# Patient Record
Sex: Female | Born: 1980 | Race: White | Hispanic: No | Marital: Married | Smoking: Former smoker
Health system: Southern US, Community
[De-identification: ages and names within clinical notes are randomized; demographics above are authoritative.]

## PROBLEM LIST (undated history)

## (undated) DIAGNOSIS — Z170 Estrogen receptor positive status [ER+]: Secondary | ICD-10-CM

## (undated) DIAGNOSIS — R0602 Shortness of breath: Secondary | ICD-10-CM

## (undated) DIAGNOSIS — Z803 Family history of malignant neoplasm of breast: Secondary | ICD-10-CM

## (undated) DIAGNOSIS — C50411 Malignant neoplasm of upper-outer quadrant of right female breast: Secondary | ICD-10-CM

## (undated) DIAGNOSIS — Z8759 Personal history of other complications of pregnancy, childbirth and the puerperium: Secondary | ICD-10-CM

## (undated) DIAGNOSIS — F1190 Opioid use, unspecified, uncomplicated: Secondary | ICD-10-CM

## (undated) DIAGNOSIS — R110 Nausea: Secondary | ICD-10-CM

## (undated) DIAGNOSIS — A609 Anogenital herpesviral infection, unspecified: Secondary | ICD-10-CM

## (undated) DIAGNOSIS — F411 Generalized anxiety disorder: Secondary | ICD-10-CM

## (undated) DIAGNOSIS — F419 Anxiety disorder, unspecified: Secondary | ICD-10-CM

## (undated) DIAGNOSIS — K5909 Other constipation: Secondary | ICD-10-CM

## (undated) HISTORY — DX: Anxiety disorder, unspecified: F41.9

## (undated) HISTORY — PX: WISDOM TOOTH EXTRACTION: SHX21

## (undated) HISTORY — DX: Family history of malignant neoplasm of breast: Z80.3

## (undated) HISTORY — PX: TONSILLECTOMY: SUR1361

## (undated) HISTORY — DX: Anogenital herpesviral infection, unspecified: A60.9

---
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
## 1999-07-28 ENCOUNTER — Other Ambulatory Visit: Admission: RE | Admit: 1999-07-28 | Discharge: 1999-07-28 | Payer: Self-pay | Admitting: *Deleted

## 2000-07-28 ENCOUNTER — Other Ambulatory Visit: Admission: RE | Admit: 2000-07-28 | Discharge: 2000-07-28 | Payer: Self-pay | Admitting: Obstetrics and Gynecology

## 2001-08-05 ENCOUNTER — Other Ambulatory Visit: Admission: RE | Admit: 2001-08-05 | Discharge: 2001-08-05 | Payer: Self-pay | Admitting: Obstetrics and Gynecology

## 2002-09-07 ENCOUNTER — Other Ambulatory Visit: Admission: RE | Admit: 2002-09-07 | Discharge: 2002-09-07 | Payer: Self-pay | Admitting: Obstetrics and Gynecology

## 2011-10-14 ENCOUNTER — Encounter: Payer: Self-pay | Admitting: Family Medicine

## 2012-01-14 ENCOUNTER — Encounter: Payer: Self-pay | Admitting: Family Medicine

## 2012-01-14 ENCOUNTER — Ambulatory Visit (INDEPENDENT_AMBULATORY_CARE_PROVIDER_SITE_OTHER): Payer: BC Managed Care – PPO | Admitting: Family Medicine

## 2012-01-14 VITALS — BP 118/80 | HR 80 | Temp 98.6°F | Ht 63.5 in | Wt 163.6 lb

## 2012-01-14 DIAGNOSIS — Z6825 Body mass index (BMI) 25.0-25.9, adult: Secondary | ICD-10-CM

## 2012-01-14 DIAGNOSIS — Z0000 Encounter for general adult medical examination without abnormal findings: Secondary | ICD-10-CM

## 2012-01-14 DIAGNOSIS — E663 Overweight: Secondary | ICD-10-CM

## 2012-01-14 DIAGNOSIS — Z136 Encounter for screening for cardiovascular disorders: Secondary | ICD-10-CM

## 2012-01-14 DIAGNOSIS — R5383 Other fatigue: Secondary | ICD-10-CM

## 2012-01-14 MED ORDER — PHENTERMINE HCL 37.5 MG PO TABS: ORAL_TABLET | ORAL | Status: DC

## 2012-01-14 NOTE — Assessment & Plan Note (Signed)
Trial adipex max 3 months Pt understands con't diet and exercise rto  month

## 2012-01-14 NOTE — Patient Instructions (Signed)

## 2012-01-14 NOTE — Progress Notes (Signed)
  Subjective:    Patient ID: Kaylee Romero, female    DOB: August 15, 1980, 32 y.o.   MRN: [id number]  HPI Pt here to establish and discuss weight loss.  She was on adipex and meridia in past---years ago and she would like to try one again to help jump start her weight loss.   Pt has just bought a tread mill and will start using that and know she needs to watch her diet.   Past Medical History  Diagnosis Date  . Anxiety    History  Substance Use Topics  . Smoking status: Current Every Day Smoker -- 0.5 packs/day for 10 years    Types: Cigarettes    Start date: 01/13/2002  . Smokeless tobacco: Never Used  . Alcohol Use: Yes     Comment: Socially   Family History  Problem Relation Age of Onset  . Hypertension Father   . Breast cancer Maternal Aunt     MGA  . Alcohol abuse Paternal Grandfather   . Alcohol abuse Maternal Grandfather    Past Surgical History  Procedure Date  . Tonsillectomy 1987     Review of Systems    as above Objective:   Physical Exam BP 118/80  Pulse 80  Temp 98.6 F (37 C) (Oral)  Ht 5' 3.5" (1.613 m)  Wt 163 lb 9.6 oz (74.208 kg)  BMI 28.53 kg/m2  SpO2 98% General appearance: alert, cooperative, appears stated age and no distress Lungs: clear to auscultation bilaterally Heart: S1, S2 normal Psych-- no depression, anxiety       Assessment & Plan:

## 2012-01-15 LAB — CBC WITH DIFFERENTIAL/PLATELET
Eosinophils Relative: 3.1 % (ref 0.0–5.0)
HCT: 42.1 % (ref 36.0–46.0)
Hemoglobin: 14.3 g/dL (ref 12.0–15.0)
Lymphs Abs: 3.1 10*3/uL (ref 0.7–4.0)
MCV: 94.5 fl (ref 78.0–100.0)
Monocytes Absolute: 0.6 10*3/uL (ref 0.1–1.0)
Monocytes Relative: 5.4 % (ref 3.0–12.0)
Neutro Abs: 6.3 10*3/uL (ref 1.4–7.7)
Platelets: 252 10*3/uL (ref 150.0–400.0)
WBC: 10.4 10*3/uL (ref 4.5–10.5)

## 2012-01-15 LAB — HEPATIC FUNCTION PANEL
ALT: 19 U/L (ref 0–35)
AST: 18 U/L (ref 0–37)
Albumin: 4.5 g/dL (ref 3.5–5.2)
Total Protein: 7.5 g/dL (ref 6.0–8.3)

## 2012-01-15 LAB — BASIC METABOLIC PANEL
BUN: 17 mg/dL (ref 6–23)
Chloride: 100 mEq/L (ref 96–112)
GFR: 124.62 mL/min (ref >60.00)
Glucose, Bld: 79 mg/dL (ref 70–99)
Potassium: 3.6 mEq/L (ref 3.5–5.1)
Sodium: 136 mEq/L (ref 135–145)

## 2012-01-15 LAB — TSH: TSH: 0.75 u[IU]/mL (ref 0.35–5.50)

## 2012-01-18 LAB — POCT URINALYSIS DIPSTICK
Ketones, UA: NEGATIVE
Leukocytes, UA: NEGATIVE
Nitrite, UA: NEGATIVE
Protein, UA: NEGATIVE
Urobilinogen, UA: 0.2
pH, UA: 6.5

## 2012-02-10 ENCOUNTER — Other Ambulatory Visit: Payer: Self-pay | Admitting: Obstetrics & Gynecology

## 2012-02-11 ENCOUNTER — Ambulatory Visit: Payer: BC Managed Care – PPO | Admitting: Family Medicine

## 2012-02-16 ENCOUNTER — Telehealth: Payer: Self-pay | Admitting: Family Medicine

## 2012-02-16 NOTE — Telephone Encounter (Signed)
Its actually good to take a break from it--- we will give it to her when she comes in.  2-3 days is ok to be off.

## 2012-02-16 NOTE — Telephone Encounter (Signed)
pt called to cancel/reschedule appt for thursday due to weather. Pt did resch for 2.17.14 at 3:30pm but states she will be 2-3 pills short on Phentermine and would like to know if we can go ahead and refill for her? cb# [phone]

## 2012-02-16 NOTE — Telephone Encounter (Signed)
msg left to call the office     KP 

## 2012-02-16 NOTE — Telephone Encounter (Signed)
Please advise      KP 

## 2012-02-17 NOTE — Telephone Encounter (Signed)
Patient aware and she voiced understanding.     KP 

## 2012-02-18 ENCOUNTER — Ambulatory Visit: Payer: BC Managed Care – PPO | Admitting: Family Medicine

## 2012-02-22 ENCOUNTER — Encounter: Payer: Self-pay | Admitting: Family Medicine

## 2012-02-22 ENCOUNTER — Ambulatory Visit (INDEPENDENT_AMBULATORY_CARE_PROVIDER_SITE_OTHER): Payer: BC Managed Care – PPO | Admitting: Family Medicine

## 2012-02-22 VITALS — BP 116/74 | HR 83 | Temp 98.4°F | Wt 164.0 lb

## 2012-02-22 DIAGNOSIS — Z6825 Body mass index (BMI) 25.0-25.9, adult: Secondary | ICD-10-CM

## 2012-02-22 DIAGNOSIS — E663 Overweight: Secondary | ICD-10-CM

## 2012-02-22 MED ORDER — PHENTERMINE HCL 37.5 MG PO TABS: ORAL_TABLET | ORAL | Status: DC

## 2012-02-22 NOTE — Progress Notes (Signed)
  Subjective:    Patient ID: Kaylee Romero, female    DOB: May 10, 1980, 32 y.o.   MRN: [id number]  HPI Pt here for f/u weight. Pt is frustrated because she is struggling to lose weight. She is exercising regularly and eating healthy. She states the adipex gives her energy and she is wearing a smaller size pants but is frustrated that she gained a lb. No other complaints.   Review of Systems As above    Objective:   Physical Exam BP 116/74  Pulse 83  Temp(Src) 98.4 F (36.9 C) (Oral)  Wt 164 lb (74.39 kg)  BMI 28.59 kg/m2  SpO2 98% General appearance: alert, cooperative, appears stated age and no distress Lungs: clear to auscultation bilaterally Heart: S1, S2 normal       Assessment & Plan:

## 2012-02-22 NOTE — Assessment & Plan Note (Signed)
Cont with diet and exercise adipex for 1 more month

## 2012-02-22 NOTE — Patient Instructions (Addendum)
Calorie Counting Diet A calorie counting diet requires you to eat the number of calories that are right for you in a day. Calories are the measurement of how much energy you get from the food you eat. Eating the right amount of calories is important for staying at a healthy weight. If you eat too many calories, your body will store them as fat and you may gain weight. If you eat too few calories, you may lose weight. Counting the number of calories you eat during a day will help you know if you are eating the right amount. A Registered Dietitian can determine how many calories you need in a day. The amount of calories needed varies from person to person. If your goal is to lose weight, you will need to eat fewer calories. Losing weight can benefit you if you are overweight or have health problems such as heart disease, high blood pressure, or diabetes. If your goal is to gain weight, you will need to eat more calories. Gaining weight may be necessary if you have a certain health problem that causes your body to need more energy. TIPS Whether you are increasing or decreasing the number of calories you eat during a day, it may be hard to get used to changes in what you eat and drink. The following are tips to help you keep track of the number of calories you eat.  Measure foods at home with measuring cups. This helps you know the amount of food and number of calories you are eating.  Restaurants often serve food in amounts that are larger than 1 serving. While eating out, estimate how many servings of a food you are given. For example, a serving of cooked rice is  cup or about the size of half of a fist. Knowing serving sizes will help you be aware of how much food you are eating at restaurants.  Ask for smaller portion sizes or child-size portions at restaurants.  Plan to eat half of a meal at a restaurant. Take the rest home or share the other half with a friend.  Read the Nutrition Facts panel on  food labels for calorie content and serving size. You can find out how many servings are in a package, the size of a serving, and the number of calories each serving has.  For example, a package might contain 3 cookies. The Nutrition Facts panel on that package says that 1 serving is 1 cookie. Below that, it will say there are 3 servings in the container. The calories section of the Nutrition Facts label says there are 90 calories. This means there are 90 calories in 1 cookie (1 serving). If you eat 1 cookie you have eaten 90 calories. If you eat all 3 cookies, you have eaten 270 calories (3 servings x 90 calories = 270 calories). The list below tells you how big or small some common portion sizes are.  1 oz.........4 stacked dice.  3 oz.........Deck of cards.  1 tsp........Tip of little finger.  1 tbs........Thumb.  2 tbs........Golf ball.   cup.......Half of a fist.  1 cup........A fist. KEEP A FOOD LOG Write down every food item you eat, the amount you eat, and the number of calories in each food you eat during the day. At the end of the day, you can add up the total number of calories you have eaten. It may help to keep a list like the one below. Find out the calorie information by reading the   Nutrition Facts panel on food labels. Breakfast  Bran cereal (1 cup, 110 calories).  Fat-free milk ( cup, 45 calories). Snack  Apple (1 medium, 80 calories). Lunch  Spinach (1 cup, 20 calories).  Tomato ( medium, 20 calories).  Chicken breast strips (3 oz, 165 calories).  Shredded cheddar cheese ( cup, 110 calories).  Light Italian dressing (2 tbs, 60 calories).  Whole-wheat bread (1 slice, 80 calories).  Tub margarine (1 tsp, 35 calories).  Vegetable soup (1 cup, 160 calories). Dinner  Pork chop (3 oz, 190 calories).  Brown rice (1 cup, 215 calories).  Steamed broccoli ( cup, 20 calories).  Strawberries (1  cup, 65 calories).  Whipped cream (1 tbs, 50  calories). Daily Calorie Total: 1425 Document Released: 12/22/2004 Document Revised: 03/16/2011 Document Reviewed: 06/18/2006 Exit[employer] Patient Information 2013 [employer].  

## 2012-03-18 ENCOUNTER — Ambulatory Visit: Payer: BC Managed Care – PPO | Admitting: Family Medicine

## 2012-03-24 ENCOUNTER — Ambulatory Visit (INDEPENDENT_AMBULATORY_CARE_PROVIDER_SITE_OTHER): Payer: BC Managed Care – PPO | Admitting: Family Medicine

## 2012-03-24 ENCOUNTER — Encounter: Payer: Self-pay | Admitting: Family Medicine

## 2012-03-24 VITALS — BP 124/78 | HR 93 | Temp 99.1°F | Wt 158.0 lb

## 2012-03-24 DIAGNOSIS — N926 Irregular menstruation, unspecified: Secondary | ICD-10-CM

## 2012-03-24 DIAGNOSIS — Z6825 Body mass index (BMI) 25.0-25.9, adult: Secondary | ICD-10-CM

## 2012-03-24 DIAGNOSIS — E663 Overweight: Secondary | ICD-10-CM

## 2012-03-24 DIAGNOSIS — F988 Other specified behavioral and emotional disorders with onset usually occurring in childhood and adolescence: Secondary | ICD-10-CM

## 2012-03-24 MED ORDER — PHENTERMINE HCL 37.5 MG PO TABS: ORAL_TABLET | ORAL | Status: DC

## 2012-03-24 NOTE — Assessment & Plan Note (Signed)
Improved greatly  Refill med for 1 more month

## 2012-03-24 NOTE — Progress Notes (Signed)
  Subjective:    Patient ID: Kaylee Romero, female    DOB: 1980/05/16, 32 y.o.   MRN: [id number]  HPI Pt here f/u weight loss.  She is doing well with weight loss , diet and exercise.  She also loves the way med makes her feel focused and thinks she may be ADD.   No other complaints.   Review of Systems    as above Objective:   Physical Exam BP 124/78  Pulse 93  Temp(Src) 99.1 F (37.3 C) (Oral)  Wt 158 lb (71.668 kg)  BMI 27.55 kg/m2  SpO2 97% General appearance: alert, cooperative, appears stated age and no distress Lungs: clear to auscultation bilaterally Heart: S1, S2 normal Psych---  Easily distracted       Assessment & Plan:

## 2012-03-24 NOTE — Assessment & Plan Note (Signed)
Pt given numbers for psych for eval

## 2012-03-24 NOTE — Patient Instructions (Addendum)

## 2013-01-14 ENCOUNTER — Ambulatory Visit (INDEPENDENT_AMBULATORY_CARE_PROVIDER_SITE_OTHER): Payer: BC Managed Care – PPO | Admitting: Adult Health

## 2013-01-14 ENCOUNTER — Encounter: Payer: Self-pay | Admitting: Adult Health

## 2013-01-14 VITALS — BP 120/80 | HR 99 | Temp 97.5°F | Resp 16 | Ht 63.5 in | Wt 146.0 lb

## 2013-01-14 DIAGNOSIS — M545 Low back pain, unspecified: Secondary | ICD-10-CM

## 2013-01-14 MED ORDER — CYCLOBENZAPRINE HCL 5 MG PO TABS: 5.0000 mg | ORAL_TABLET | Freq: Three times a day (TID) | ORAL | Status: DC | PRN

## 2013-01-14 MED ORDER — HYDROCODONE-ACETAMINOPHEN 5-325 MG PO TABS: 1.0000 | ORAL_TABLET | Freq: Four times a day (QID) | ORAL | Status: DC | PRN

## 2013-01-14 MED ORDER — IBUPROFEN 800 MG PO TABS: 800.0000 mg | ORAL_TABLET | Freq: Three times a day (TID) | ORAL | Status: DC | PRN

## 2013-01-14 NOTE — Progress Notes (Signed)
   Subjective:    Patient ID: Kaylee Romero, female    DOB: 1980/11/29, 33 y.o.   MRN: [id number]  HPI  Pt reports low back pain following a massage 1 week ago. Improved with sitting. Initially painful when first stands up. She has seen a chiropractor x 4 this past week with some improvement. Cannot bend forward. Twisting motions very painful. Squatting not painful. No radiation reported. Towards the end of the day pain is also felt towards thoracic area.  Review of Systems  Constitutional: Negative.   Gastrointestinal: Negative.   Musculoskeletal: Positive for back pain.  Psychiatric/Behavioral: Negative.        Objective:   Physical Exam  Constitutional: She is oriented to person, place, and time.  Musculoskeletal:  Decreased ROM when bending at hip. Negative straight leg raise. Left hip higher than the right.  Neurological: She is alert and oriented to person, place, and time.  Skin: Skin is warm and dry.  Psychiatric: She has a normal mood and affect. Her behavior is normal. Thought content normal.          Assessment & Plan:

## 2013-01-14 NOTE — Patient Instructions (Signed)
   Take ibuprofen 800 mg every 8 hours for the next 4 days. Take with food.  Also take flexeril for muscle spasms. This will make you sleepy so only take this at bedtime when you are working.  Apply ice alternating with heat to the affected areas for 15 min at a time. Do this approximately 3-4 times daily.  Use a firm pillow between your knees when you lie on your side or under your knees when you lie on your back.  A chiropractor may also be beneficial for the low back pain.  Return to clinic if your symptoms are not improving within 4 weeks or sooner if your symptoms worsen.

## 2013-01-14 NOTE — Assessment & Plan Note (Signed)
Suspect muscle strain. Negative straight leg raise. Negative radiculopathy. Ibuprofen 800 mg q 8 hr x 4 days then prn. Norco for moderate to severe pain. Flexeril for muscle spasms. See pt instruction for further details of tx. RTC if no improvement within 4 weeks or sooner if necessary.

## 2013-01-14 NOTE — Progress Notes (Signed)
Pre-visit discussion using our clinic review tool. No additional management support is needed unless otherwise documented below in the visit note.  

## 2013-02-16 ENCOUNTER — Other Ambulatory Visit: Payer: Self-pay | Admitting: Obstetrics & Gynecology

## 2014-02-07 ENCOUNTER — Other Ambulatory Visit: Payer: Self-pay | Admitting: Obstetrics & Gynecology

## 2014-02-09 LAB — CYTOLOGY - PAP

## 2016-11-25 LAB — OB RESULTS CONSOLE ABO/RH: RH Type: POSITIVE

## 2016-11-25 LAB — OB RESULTS CONSOLE HEPATITIS B SURFACE ANTIGEN: Hepatitis B Surface Ag: NEGATIVE

## 2016-11-25 LAB — OB RESULTS CONSOLE RUBELLA ANTIBODY, IGM: Rubella: IMMUNE

## 2016-11-25 LAB — OB RESULTS CONSOLE GC/CHLAMYDIA
CHLAMYDIA, DNA PROBE: NEGATIVE
Gonorrhea: NEGATIVE

## 2016-11-25 LAB — OB RESULTS CONSOLE RPR: RPR: NONREACTIVE

## 2016-11-25 LAB — OB RESULTS CONSOLE ANTIBODY SCREEN: Antibody Screen: NEGATIVE

## 2016-11-25 LAB — OB RESULTS CONSOLE HIV ANTIBODY (ROUTINE TESTING): HIV: NONREACTIVE

## 2017-01-05 NOTE — L&D Delivery Note (Signed)
Delivery Note At 8:51 PM a viable female was delivered via Vaginal, Spontaneous (Presentation: LOA). APGARs: 8, 9; weight pending.   Placenta status: S, I. 3V Cord with the following complications: none.  Cord pH: n/a  Anesthesia:  CLEA Episiotomy: None Lacerations: 2nd degree Suture Repair: 3.0 vicryl rapide Est. Blood Loss (mL): 350  Mom to postpartum.  Baby to Couplet care / Skin to Skin.  Parrie Rasco, [place] 06/18/2017, 9:16 PM

## 2017-06-09 ENCOUNTER — Telehealth (HOSPITAL_COMMUNITY): Payer: Self-pay | Admitting: *Deleted

## 2017-06-09 ENCOUNTER — Encounter (HOSPITAL_COMMUNITY): Payer: Self-pay | Admitting: *Deleted

## 2017-06-09 LAB — OB RESULTS CONSOLE GBS: GBS: POSITIVE

## 2017-06-09 NOTE — Telephone Encounter (Signed)
Preadmission screen  

## 2017-06-16 ENCOUNTER — Telehealth (HOSPITAL_COMMUNITY): Payer: Self-pay | Admitting: *Deleted

## 2017-06-16 NOTE — Telephone Encounter (Signed)
Preadmission screen  

## 2017-06-17 ENCOUNTER — Telehealth (HOSPITAL_COMMUNITY): Payer: Self-pay | Admitting: *Deleted

## 2017-06-17 ENCOUNTER — Encounter (HOSPITAL_COMMUNITY): Payer: Self-pay | Admitting: *Deleted

## 2017-06-17 NOTE — Telephone Encounter (Signed)
Preadmission screen  

## 2017-06-18 ENCOUNTER — Inpatient Hospital Stay (HOSPITAL_COMMUNITY): Payer: 59 | Admitting: Anesthesiology

## 2017-06-18 ENCOUNTER — Encounter (HOSPITAL_COMMUNITY): Payer: Self-pay

## 2017-06-18 ENCOUNTER — Inpatient Hospital Stay (HOSPITAL_COMMUNITY)
Admission: RE | Admit: 2017-06-18 | Discharge: 2017-06-20 | DRG: 807 | Disposition: A | Discharge status: Home / Self Care | Payer: 59 | Attending: Obstetrics & Gynecology | Admitting: Obstetrics & Gynecology

## 2017-06-18 DIAGNOSIS — Z87891 Personal history of nicotine dependence: Secondary | ICD-10-CM

## 2017-06-18 DIAGNOSIS — Z3A38 38 weeks gestation of pregnancy: Secondary | ICD-10-CM | POA: Diagnosis not present

## 2017-06-18 DIAGNOSIS — O99824 Streptococcus B carrier state complicating childbirth: Secondary | ICD-10-CM | POA: Diagnosis present

## 2017-06-18 DIAGNOSIS — O134 Gestational [pregnancy-induced] hypertension without significant proteinuria, complicating childbirth: Principal | ICD-10-CM | POA: Diagnosis present

## 2017-06-18 DIAGNOSIS — Z3490 Encounter for supervision of normal pregnancy, unspecified, unspecified trimester: Secondary | ICD-10-CM

## 2017-06-18 DIAGNOSIS — O139 Gestational [pregnancy-induced] hypertension without significant proteinuria, unspecified trimester: Secondary | ICD-10-CM | POA: Diagnosis present

## 2017-06-18 LAB — CBC
HCT: 35.7 % — ABNORMAL LOW (ref 36.0–46.0)
HCT: 36.1 % (ref 36.0–46.0)
HCT: 35.1 % — ABNORMAL LOW (ref 36.0–46.0)
HEMOGLOBIN: 12.5 g/dL (ref 12.0–15.0)
HEMOGLOBIN: 12.5 g/dL (ref 12.0–15.0)
HEMOGLOBIN: 12.2 g/dL (ref 12.0–15.0)
MCH: 31.8 pg (ref 26.0–34.0)
MCH: 31.3 pg (ref 26.0–34.0)
MCH: 31.7 pg (ref 26.0–34.0)
MCHC: 35 g/dL (ref 30.0–36.0)
MCHC: 34.6 g/dL (ref 30.0–36.0)
MCHC: 34.8 g/dL (ref 30.0–36.0)
MCV: 90.8 fL (ref 78.0–100.0)
MCV: 90.5 fL (ref 78.0–100.0)
MCV: 91.2 fL (ref 78.0–100.0)
PLATELETS: 250 10*3/uL (ref 150–400)
PLATELETS: 231 10*3/uL (ref 150–400)
Platelets: 246 10*3/uL (ref 150–400)
RBC: 3.93 MIL/uL (ref 3.87–5.11)
RBC: 3.99 MIL/uL (ref 3.87–5.11)
RBC: 3.85 MIL/uL — ABNORMAL LOW (ref 3.87–5.11)
RDW: 12.9 % (ref 11.5–15.5)
RDW: 12.9 % (ref 11.5–15.5)
RDW: 13 % (ref 11.5–15.5)
WBC: 10.5 10*3/uL (ref 4.0–10.5)
WBC: 12.4 10*3/uL — ABNORMAL HIGH (ref 4.0–10.5)
WBC: 25 10*3/uL — ABNORMAL HIGH (ref 4.0–10.5)

## 2017-06-18 LAB — RPR: RPR: NONREACTIVE

## 2017-06-18 LAB — COMPREHENSIVE METABOLIC PANEL
ALK PHOS: 127 U/L — AB (ref 38–126)
ALT: 21 U/L (ref 14–54)
ANION GAP: 11 (ref 5–15)
AST: 27 U/L (ref 15–41)
Albumin: 3.1 g/dL — ABNORMAL LOW (ref 3.5–5.0)
BUN: 10 mg/dL (ref 6–20)
CALCIUM: 8.9 mg/dL (ref 8.9–10.3)
CHLORIDE: 106 mmol/L (ref 101–111)
CO2: 18 mmol/L — AB (ref 22–32)
Creatinine, Ser: 0.44 mg/dL (ref 0.44–1.00)
GFR calc Af Amer: >60 mL/min (ref >60)
Glucose, Bld: 88 mg/dL (ref 65–99)
Potassium: 3.9 mmol/L (ref 3.5–5.1)
SODIUM: 135 mmol/L (ref 135–145)
Total Bilirubin: 0.5 mg/dL (ref 0.3–1.2)
Total Protein: 6.4 g/dL — ABNORMAL LOW (ref 6.5–8.1)

## 2017-06-18 LAB — TYPE AND SCREEN
ABO/RH(D): O POS
ANTIBODY SCREEN: NEGATIVE

## 2017-06-18 LAB — ABO/RH: ABO/RH(D): O POS

## 2017-06-18 MED ORDER — LACTATED RINGERS IV SOLN
500.0000 mL | Freq: Once | INTRAVENOUS | Status: AC
  Administered 2017-06-18: 500 mL via INTRAVENOUS

## 2017-06-18 MED ORDER — PRENATAL MULTIVITAMIN CH
1.0000 | ORAL_TABLET | Freq: Every day | ORAL | Status: DC
  Administered 2017-06-19 – 2017-06-20 (×2): 1 via ORAL
  Filled 2017-06-18 (×2): qty 1

## 2017-06-18 MED ORDER — SIMETHICONE 80 MG PO CHEW: 80.0000 mg | CHEWABLE_TABLET | ORAL | Status: DC | PRN

## 2017-06-18 MED ORDER — ONDANSETRON HCL 4 MG/2ML IJ SOLN: 4.0000 mg | Freq: Four times a day (QID) | INTRAMUSCULAR | Status: DC | PRN

## 2017-06-18 MED ORDER — PENICILLIN G POT IN DEXTROSE 60000 UNIT/ML IV SOLN
3.0000 10*6.[IU] | INTRAVENOUS | Status: DC
  Administered 2017-06-18 (×2): 3 10*6.[IU] via INTRAVENOUS
  Filled 2017-06-18 (×4): qty 50

## 2017-06-18 MED ORDER — ZOLPIDEM TARTRATE 5 MG PO TABS: 5.0000 mg | ORAL_TABLET | Freq: Every evening | ORAL | Status: DC | PRN

## 2017-06-18 MED ORDER — SENNOSIDES-DOCUSATE SODIUM 8.6-50 MG PO TABS
2.0000 | ORAL_TABLET | ORAL | Status: DC
  Administered 2017-06-19 (×2): 2 via ORAL
  Filled 2017-06-18 (×2): qty 2

## 2017-06-18 MED ORDER — DIPHENHYDRAMINE HCL 50 MG/ML IJ SOLN: 12.5000 mg | INTRAMUSCULAR | Status: DC | PRN

## 2017-06-18 MED ORDER — OXYCODONE-ACETAMINOPHEN 5-325 MG PO TABS: 1.0000 | ORAL_TABLET | ORAL | Status: DC | PRN

## 2017-06-18 MED ORDER — FENTANYL 2.5 MCG/ML BUPIVACAINE 1/10 % EPIDURAL INFUSION (WH - ANES)
14.0000 mL/h | INTRAMUSCULAR | Status: DC | PRN
  Administered 2017-06-18: 14 mL/h via EPIDURAL
  Filled 2017-06-18: qty 100

## 2017-06-18 MED ORDER — OXYCODONE-ACETAMINOPHEN 5-325 MG PO TABS: 2.0000 | ORAL_TABLET | ORAL | Status: DC | PRN

## 2017-06-18 MED ORDER — ACETAMINOPHEN 325 MG PO TABS: 650.0000 mg | ORAL_TABLET | ORAL | Status: DC | PRN

## 2017-06-18 MED ORDER — WITCH HAZEL-GLYCERIN EX PADS
1.0000 | MEDICATED_PAD | CUTANEOUS | Status: DC | PRN
Start: 2017-06-18 — End: 2017-06-20
  Administered 2017-06-19: 1 via TOPICAL

## 2017-06-18 MED ORDER — OXYTOCIN BOLUS FROM INFUSION
500.0000 mL | Freq: Once | INTRAVENOUS | Status: AC
  Administered 2017-06-18: 500 mL via INTRAVENOUS

## 2017-06-18 MED ORDER — COCONUT OIL OIL: 1.0000 "application " | TOPICAL_OIL | Status: DC | PRN

## 2017-06-18 MED ORDER — DIBUCAINE 1 % RE OINT: 1.0000 "application " | TOPICAL_OINTMENT | RECTAL | Status: DC | PRN

## 2017-06-18 MED ORDER — FENTANYL CITRATE (PF) 100 MCG/2ML IJ SOLN: 50.0000 ug | INTRAMUSCULAR | Status: DC | PRN

## 2017-06-18 MED ORDER — LIDOCAINE HCL (PF) 1 % IJ SOLN
30.0000 mL | INTRAMUSCULAR | Status: DC | PRN
  Filled 2017-06-18: qty 30

## 2017-06-18 MED ORDER — ONDANSETRON HCL 4 MG/2ML IJ SOLN: 4.0000 mg | INTRAMUSCULAR | Status: DC | PRN

## 2017-06-18 MED ORDER — SODIUM CHLORIDE 0.9 % IV SOLN
5.0000 10*6.[IU] | Freq: Once | INTRAVENOUS | Status: AC
  Administered 2017-06-18: 5 10*6.[IU] via INTRAVENOUS
  Filled 2017-06-18: qty 5

## 2017-06-18 MED ORDER — PHENYLEPHRINE 40 MCG/ML (10ML) SYRINGE FOR IV PUSH (FOR BLOOD PRESSURE SUPPORT)
80.0000 ug | PREFILLED_SYRINGE | INTRAVENOUS | Status: DC | PRN
  Filled 2017-06-18: qty 10
  Filled 2017-06-18: qty 5

## 2017-06-18 MED ORDER — ONDANSETRON HCL 4 MG PO TABS: 4.0000 mg | ORAL_TABLET | ORAL | Status: DC | PRN

## 2017-06-18 MED ORDER — TERBUTALINE SULFATE 1 MG/ML IJ SOLN
0.2500 mg | Freq: Once | INTRAMUSCULAR | Status: DC | PRN
  Filled 2017-06-18: qty 1

## 2017-06-18 MED ORDER — OXYTOCIN 40 UNITS IN LACTATED RINGERS INFUSION - SIMPLE MED: 2.5000 [IU]/h | INTRAVENOUS | Status: DC

## 2017-06-18 MED ORDER — LIDOCAINE HCL (PF) 1 % IJ SOLN
INTRAMUSCULAR | Status: DC | PRN
  Administered 2017-06-18: 6 mL via EPIDURAL
  Administered 2017-06-18: 4 mL via EPIDURAL

## 2017-06-18 MED ORDER — LACTATED RINGERS IV SOLN: 500.0000 mL | INTRAVENOUS | Status: DC | PRN

## 2017-06-18 MED ORDER — EPHEDRINE 5 MG/ML INJ
10.0000 mg | INTRAVENOUS | Status: DC | PRN
  Filled 2017-06-18: qty 2

## 2017-06-18 MED ORDER — OXYTOCIN 40 UNITS IN LACTATED RINGERS INFUSION - SIMPLE MED
1.0000 m[IU]/min | INTRAVENOUS | Status: DC
  Administered 2017-06-18: 2 m[IU]/min via INTRAVENOUS
  Filled 2017-06-18: qty 1000

## 2017-06-18 MED ORDER — MISOPROSTOL 25 MCG QUARTER TABLET
25.0000 ug | ORAL_TABLET | ORAL | Status: DC
  Administered 2017-06-18: 25 ug via VAGINAL
  Filled 2017-06-18 (×5): qty 1

## 2017-06-18 MED ORDER — BENZOCAINE-MENTHOL 20-0.5 % EX AERO
1.0000 "application " | INHALATION_SPRAY | CUTANEOUS | Status: DC | PRN
  Administered 2017-06-19: 1 via TOPICAL
  Filled 2017-06-18: qty 56

## 2017-06-18 MED ORDER — LACTATED RINGERS IV SOLN
INTRAVENOUS | Status: DC
  Administered 2017-06-18 (×2): via INTRAVENOUS

## 2017-06-18 MED ORDER — TETANUS-DIPHTH-ACELL PERTUSSIS 5-2.5-18.5 LF-MCG/0.5 IM SUSP: 0.5000 mL | Freq: Once | INTRAMUSCULAR | Status: DC

## 2017-06-18 MED ORDER — FLEET ENEMA 7-19 GM/118ML RE ENEM: 1.0000 | ENEMA | RECTAL | Status: DC | PRN

## 2017-06-18 MED ORDER — DIPHENHYDRAMINE HCL 25 MG PO CAPS: 25.0000 mg | ORAL_CAPSULE | Freq: Four times a day (QID) | ORAL | Status: DC | PRN

## 2017-06-18 MED ORDER — IBUPROFEN 600 MG PO TABS
600.0000 mg | ORAL_TABLET | Freq: Four times a day (QID) | ORAL | Status: DC
  Administered 2017-06-18 – 2017-06-20 (×7): 600 mg via ORAL
  Filled 2017-06-18 (×7): qty 1

## 2017-06-18 MED ORDER — PHENYLEPHRINE 40 MCG/ML (10ML) SYRINGE FOR IV PUSH (FOR BLOOD PRESSURE SUPPORT)
80.0000 ug | PREFILLED_SYRINGE | INTRAVENOUS | Status: DC | PRN
  Filled 2017-06-18: qty 5

## 2017-06-18 MED ORDER — SOD CITRATE-CITRIC ACID 500-334 MG/5ML PO SOLN: 30.0000 mL | ORAL | Status: DC | PRN

## 2017-06-18 NOTE — Anesthesia Pain Management Evaluation Note (Signed)
  CRNA Pain Management Visit Note  Patient: Kaylee Romero, 37 y.o., female  "Hello I am a member of the anesthesia team at [facility]. We have an anesthesia team available at all times to provide care throughout the hospital, including epidural management and anesthesia for C-section. I don't know your plan for the delivery whether it a natural birth, water birth, IV sedation, nitrous supplementation, doula or epidural, but we want to meet your pain goals."   1.Was your pain managed to your expectations on prior hospitalizations?   No prior hospitalizations  2.What is your expectation for pain management during this hospitalization?     Epidural  3.How can we help you reach that goal? epidural  Record the patient's initial score and the patient's pain goal.   Pain: 0  Pain Goal: 4 [facility] wants you to be able to say your pain was always managed very well.  Ashtan Girtman 06/18/2017

## 2017-06-18 NOTE — Progress Notes (Signed)
CVX 2/80/-1, AROM, clear Continue pitocin CLEA when desired.  Linda Hedges, DO

## 2017-06-18 NOTE — Anesthesia Procedure Notes (Signed)
Epidural Patient location during procedure: OB Start time: 06/18/2017 3:52 PM End time: 06/18/2017 3:56 PM  Staffing Anesthesiologist: Audry Pili, MD Performed: anesthesiologist   Preanesthetic Checklist Completed: patient identified, pre-op evaluation, timeout performed, IV checked, risks and benefits discussed and monitors and equipment checked  Epidural Patient position: sitting Prep: DuraPrep Patient monitoring: continuous pulse ox and blood pressure Approach: midline Location: L2-L3 Injection technique: LOR saline  Needle:  Needle type: Tuohy  Needle gauge: 17 G Needle length: 9 cm Needle insertion depth: 6.5 cm Catheter size: 19 Gauge Catheter at skin depth: 12 cm Test dose: negative and Other (1% lidocaine)  Additional Notes Patient identified. Risks including, but not limited to, bleeding, infection, nerve damage, paralysis, inadequate analgesia, blood pressure changes, nausea, vomiting, allergic reaction, postpartum back pain, itching, and headache were discussed. Patient expressed understanding and wished to proceed. Sterile prep and drape, including hand hygiene, mask, and sterile gloves were used. The patient was positioned and the spine was prepped. The skin was anesthetized with lidocaine. No paraesthesia or other complication noted. The patient did not experience any signs of intravascular injection such as tinnitus or metallic taste in mouth, nor signs of intrathecal spread such as rapid motor block. Please see nursing notes for vital signs. The patient tolerated the procedure well.   Kaylee Romero, MDReason for block:procedure for pain

## 2017-06-18 NOTE — H&P (Signed)
Kaylee Romero is a 37 y.o. female presenting for IOL for gestational hypertension.  Labs have been normal and patient has been asymptomatic.  AMA with normal panorama; female.  Patient has HSV; declines prodrome and taking valtrex ppx. Last u/s 06/02/17 6#9 (82%). Patient denies HA, CP/SOB, RUQ pain, or visual disturbance.  Active FM. No CTX, VB or LOF. GBS positive.  OB History    Gravida  1   Para      Term      Preterm      AB      Living        SAB      TAB      Ectopic      Multiple      Live Births             Past Medical History:  Diagnosis Date  . Anxiety   . HSV (herpes simplex virus) anogenital infection    positive titer only   Past Surgical History:  Procedure Laterality Date  . TONSILLECTOMY  1987  . TONSILLECTOMY    . WISDOM TOOTH EXTRACTION     Family History: family history includes Alcohol abuse in her maternal grandfather and paternal grandfather; Heart disease in her maternal grandmother and paternal grandfather; Hypertension in her father. Social History:  reports that she quit smoking about 8 months ago. Her smoking use included cigarettes. She started smoking about 15 years ago. She has a 5.00 pack-year smoking history. She has never used smokeless tobacco. She reports that she drinks alcohol. She reports that she does not use drugs.     Maternal Diabetes: No Genetic Screening: Normal Maternal Ultrasounds/Referrals: Normal Fetal Ultrasounds or other Referrals:  None Maternal Substance Abuse:  No Significant Maternal Medications:  Meds include: Other: Valtrex Significant Maternal Lab Results:  Lab values include: Group B Strep positive Other Comments:  None  ROS Maternal Medical History:  Fetal activity: Perceived fetal activity is normal.   Last perceived fetal movement was within the past hour.    Prenatal complications: PIH.   Prenatal Complications - Diabetes: none.    Dilation: 1.5 Effacement (%): 70 Station: -1 Exam by::  Uvaldo Bristle, MD Temperature 98.9 F (37.2 C), temperature source Oral, resp. rate 16, last menstrual period 09/25/2016. Maternal Exam:  Uterine Assessment: Contraction strength is mild.  Contraction frequency is rare.   Abdomen: Patient reports no abdominal tenderness. Fundal height is S>D.   Estimated fetal weight is 8#.   Fetal presentation: vertex  Introitus: Normal vulva. Pelvis: adequate for delivery.   Cervix: Cervix evaluated by digital exam.     Physical Exam  Constitutional: She is oriented to person, place, and time. She appears well-developed and well-nourished.  GI: Soft. There is no rebound and no guarding.  Neurological: She is alert and oriented to person, place, and time.  Skin: Skin is warm and dry.  Psychiatric: She has a normal mood and affect. Her behavior is normal.    Prenatal labs: ABO, Rh: O/Positive/-- (11/21 0000) Antibody: Negative (11/21 0000) Rubella: Immune (11/21 0000) RPR: Nonreactive (11/21 0000)  HBsAg: Negative (11/21 0000)  HIV: Non-reactive (11/21 0000)  GBS: Positive (06/05 0000)   Assessment/Plan: 36yo G1 at 38 weeks with GHTN -VMP -Epidural when desired -PCN for GBS ppx -IV antihypertensives prn -Anticipate NSVD   Danton Palmateer 06/18/2017, 8:07 AM

## 2017-06-18 NOTE — Anesthesia Preprocedure Evaluation (Signed)
Anesthesia Evaluation  Patient identified by MRN, date of birth, ID band Patient awake    Reviewed: Allergy & Precautions, NPO status , Patient's Chart, lab work & pertinent test results  Airway Mallampati: II  TM Distance: >3 FB Neck ROM: Full    Dental no notable dental hx.    Pulmonary former smoker,    breath sounds clear to auscultation       Cardiovascular  Rhythm:Regular Rate:Normal  Gestational HTN   Neuro/Psych Anxiety negative neurological ROS     GI/Hepatic negative GI ROS, Neg liver ROS,   Endo/Other  Obesity  Renal/GU negative Renal ROS  negative genitourinary   Musculoskeletal Low back pain   Abdominal (+) + obese,   Peds  (+) ATTENTION DEFICIT DISORDER WITHOUT HYPERACTIVITY Hematology negative hematology ROS (+)   Anesthesia Other Findings HSV  Reproductive/Obstetrics (+) Pregnancy                             Anesthesia Physical Anesthesia Plan  ASA: II  Anesthesia Plan: Epidural   Post-op Pain Management:    Induction:   PONV Risk Score and Plan:   Airway Management Planned: Natural Airway  Additional Equipment: None  Intra-op Plan:   Post-operative Plan:   Informed Consent: I have reviewed the patients History and Physical, chart, labs and discussed the procedure including the risks, benefits and alternatives for the proposed anesthesia with the patient or authorized representative who has indicated his/her understanding and acceptance.     Plan Discussed with: Anesthesiologist  Anesthesia Plan Comments: (Labs reviewed. Platelets acceptable, patient not taking any blood thinning medications. Risks and benefits discussed with patient, patient expressed understanding and wished to proceed.)        Anesthesia Quick Evaluation

## 2017-06-19 ENCOUNTER — Other Ambulatory Visit: Payer: Self-pay

## 2017-06-19 LAB — CBC
HEMATOCRIT: 32.3 % — AB (ref 36.0–46.0)
HEMOGLOBIN: 11.2 g/dL — AB (ref 12.0–15.0)
MCH: 31.5 pg (ref 26.0–34.0)
MCHC: 34.7 g/dL (ref 30.0–36.0)
MCV: 91 fL (ref 78.0–100.0)
Platelets: 224 10*3/uL (ref 150–400)
RBC: 3.55 MIL/uL — ABNORMAL LOW (ref 3.87–5.11)
RDW: 12.9 % (ref 11.5–15.5)
WBC: 22.8 10*3/uL — ABNORMAL HIGH (ref 4.0–10.5)

## 2017-06-19 NOTE — Progress Notes (Signed)
Mother of baby was referred for history of anxiety. Referral screened out by CSW because per chart review, patient is actively taking Celexa and Klonapin to address her anxiety symptoms. Per prenatal record, patient has taken Celexa for many years.   Please contact CSW if mother of baby requests, if needs arise, or if mother of baby scores greater than a nine or answers yes to question ten on [place] Postpartum Depression Screen.  Madilyn Fireman, MSW, [place] Social Worker [facility] [phone]

## 2017-06-19 NOTE — Anesthesia Postprocedure Evaluation (Signed)
Anesthesia Post Note  Patient: Kaylee Romero  Procedure(s) Performed: AN AD [place]     Patient location during evaluation: Mother Baby Anesthesia Type: Epidural Level of consciousness: awake and alert, oriented and patient cooperative Pain management: pain level controlled Vital Signs Assessment: post-procedure vital signs reviewed and stable Respiratory status: spontaneous breathing Cardiovascular status: stable Postop Assessment: no headache, epidural receding, patient able to bend at knees and no signs of nausea or vomiting Anesthetic complications: no Comments: Pain score 1.    Last Vitals:  Vitals:   06/19/17 0525 06/19/17 0630  BP: (!) 144/72 (!) 142/78  Pulse: 83 83  Resp: 20   Temp:  (!) 36.3 C  SpO2:      Last Pain:  Vitals:   06/19/17 0630  TempSrc: Oral  PainSc:    Pain Goal: Patients Stated Pain Goal: 2 (06/19/17 0338)               Rico Sheehan

## 2017-06-19 NOTE — Lactation Note (Signed)
This note was copied from a baby's chart. Lactation Consultation Note Baby 32 hrs old. Mom states baby is cluster feeding. Mom states baby slept most of the day and has now started wanting to feed every hour.  Mom sitting on cough BF in football position using good support when LC entered. Mom had baby swaddled, mom's breast just pulled out. Encouraged next feeding STS. Mom stated baby likes to be swaddled tight. Mom has large breast w/very short shaft nipples that semi flat when compressed.  Rt. Breast w/edema. [place] edema noted. Reverse pressure only slightly helpful. Nipple thick from inner edema. Recommended pre-pumping w/hand pump to evert nipple more and wearing shells. Mom stated she has her own shells. Strongly encouraged to wear them to help w/edema. Mom at times compressing breast vertical making difficulty for baby to latch. Encouraged to compress horizontal in the direction of the baby's mouth.  Baby starting to get very frustrated and aggressive on the breast. Mom stating "she won't stay on, she keep popping off".  Discussed w/mom some reasons baby may be doing that: Baby can't feel nipple deep in the mouth not getting a deep latch, Gas, may need burped, Maybe can't breath well in nose is into breast, Can't feel breast to cheeks, may have dirty diaper,  Maybe positionally uncomfortable. Discussed w/mom baby is learning like she is, keep trying different things until find something that is working. Discussed what works today may need to be different tomorrow. Be patient, STS, hand express give colostrum, pump and wear shells. Discussed newborn feeding habits, behaviors, I&O, cluster feeding, supply and demand.  LC feels that the mom is going to do the way she wants but the baby will not get good transfer d/t can't get deep latch w/edema that mom has and not compressible. Mentioned if shells and pump w/reverse pressure doesn't help, may need to try NS.  Mom states she has heard of them.   Mom encouraged to feed baby 8-12 times/24 hours and with feeding cues.  [place] brochure given w/resources, support groups and [place] services. Patient Name: Kaylee Romero [id number] Date: 06/19/2017 Reason for consult: Initial assessment;Early term 37-38.6wks   Maternal Data Has patient been taught Hand Expression?: Yes Does the patient have breastfeeding experience prior to this delivery?: No  Feeding Feeding Type: Breast Fed Length of feed: 20 min  LATCH Score Latch: Repeated attempts needed to sustain latch, nipple held in mouth throughout feeding, stimulation needed to elicit sucking reflex.  Audible Swallowing: None  Type of Nipple: Everted at rest and after stimulation(semi flat when compressed, very short shaft)  Comfort (Breast/Nipple): Filling, red/small blisters or bruises, mild/mod discomfort(edema to breast/tender)  Hold (Positioning): Assistance needed to correctly position infant at breast and maintain latch.  LATCH Score: 5  Interventions Interventions: Breast feeding basics reviewed;Support pillows;Assisted with latch;Position options;Pre-pump if needed;Reverse pressure;Hand pump;Breast compression;Adjust position  Lactation Tools Discussed/Used Tools: Other (comment);Pump(mom states she has shells) Shell Type: Inverted Breast pump type: Manual Pump Review: Milk Storage   Consult Status Consult Status: Follow-up Date: 06/20/17 Follow-up type: In-patient    Theodoro Kalata 06/19/2017, 10:12 PM

## 2017-06-19 NOTE — Progress Notes (Signed)
Post Partum Day 1 Subjective: no complaints, up ad lib and voiding. No HA, CP/SOB, RUQ pain, or visual disturbance.  Objective: Blood pressure (!) 142/78, pulse 83, temperature (!) 97.4 F (36.3 C), temperature source Oral, resp. rate 20, height 5\' 4"  (1.626 m), weight 215 lb (97.5 kg), last menstrual period 09/25/2016, SpO2 100 %, unknown if currently breastfeeding.  Physical Exam:  General: alert, cooperative and appears stated age Lochia: appropriate Uterine Fundus: firm Incision: healing well, no significant drainage, no dehiscence DVT Evaluation: No evidence of DVT seen on physical exam. Negative Homan's sign. No cords or calf tenderness.  Recent Labs    06/18/17 2137 06/19/17 0543  HGB 12.2 11.2*  HCT 35.1* 32.3*    Assessment/Plan: Plan for discharge tomorrow and Breastfeeding  GHTN-normal to mild range BPs.  Will continue to monitor and check CBC, CMET tomorrow am.  Asymptomatic.   LOS: 1 day   Ramiro Pangilinan 06/19/2017, 9:11 AM

## 2017-06-19 NOTE — Lactation Note (Signed)
This note was copied from a baby's chart. Lactation Consultation Note  Patient Name: Girl Ameerah Huffstetler TAEWY'B Date: 06/19/2017   Mom with room full of visitors, asked for [facility] to come back at 7 pm. Lactation to come back to do lactation assessment.  Maternal Data    Feeding Feeding Type: Breast Fed Length of feed: 0 min    Interventions    Lactation Tools Discussed/Used     Consult Status      Shylynn Bruning S Charnelle Bergeman 06/19/2017, 5:53 PM

## 2017-06-20 ENCOUNTER — Ambulatory Visit: Payer: Self-pay

## 2017-06-20 LAB — COMPREHENSIVE METABOLIC PANEL
ALK PHOS: 90 U/L (ref 38–126)
ALT: 25 U/L (ref 14–54)
AST: 39 U/L (ref 15–41)
Albumin: 2.6 g/dL — ABNORMAL LOW (ref 3.5–5.0)
Anion gap: 9 (ref 5–15)
BILIRUBIN TOTAL: 0.4 mg/dL (ref 0.3–1.2)
BUN: 8 mg/dL (ref 6–20)
CALCIUM: 8.1 mg/dL — AB (ref 8.9–10.3)
CHLORIDE: 107 mmol/L (ref 101–111)
CO2: 21 mmol/L — ABNORMAL LOW (ref 22–32)
CREATININE: 0.46 mg/dL (ref 0.44–1.00)
Glucose, Bld: 85 mg/dL (ref 65–99)
Potassium: 3.9 mmol/L (ref 3.5–5.1)
Sodium: 137 mmol/L (ref 135–145)
Total Protein: 5.7 g/dL — ABNORMAL LOW (ref 6.5–8.1)

## 2017-06-20 LAB — CBC
HEMATOCRIT: 30.4 % — AB (ref 36.0–46.0)
HEMOGLOBIN: 10.5 g/dL — AB (ref 12.0–15.0)
MCH: 31.6 pg (ref 26.0–34.0)
MCHC: 34.5 g/dL (ref 30.0–36.0)
MCV: 91.6 fL (ref 78.0–100.0)
Platelets: 218 10*3/uL (ref 150–400)
RBC: 3.32 MIL/uL — AB (ref 3.87–5.11)
RDW: 13.2 % (ref 11.5–15.5)
WBC: 14 10*3/uL — AB (ref 4.0–10.5)

## 2017-06-20 MED ORDER — LABETALOL HCL 200 MG PO TABS
200.0000 mg | ORAL_TABLET | Freq: Two times a day (BID) | ORAL | Status: DC
  Administered 2017-06-20: 200 mg via ORAL
  Filled 2017-06-20: qty 1

## 2017-06-20 MED ORDER — IBUPROFEN 600 MG PO TABS: 600.0000 mg | ORAL_TABLET | Freq: Four times a day (QID) | ORAL | 0 refills | Status: DC

## 2017-06-20 MED ORDER — LABETALOL HCL 200 MG PO TABS: 200.0000 mg | ORAL_TABLET | Freq: Two times a day (BID) | ORAL | 0 refills | Status: DC

## 2017-06-20 NOTE — Lactation Note (Signed)
This note was copied from a baby's chart. Lactation Consultation Note: Mother concerned that infant has not fed in 5 hours. She has attempt several times but reports that infant is not cuing.  Assist mother with hand expression. Infant was fed 3 ml of ebm with a spoon.  Advised mother to hand express frequently. Mothers breast tissue is swollen. She has shells. Mother taught to firm nipple and do reverse pressure.  Infant placed in football hold on the left breast. Infant sustained latch for 20 mins. Observed consistent suckling and audible swallows.  Infant placed on the alternate breast. Mother has a short nipple on the rt. Assist mother with firming her nipple. Mother taught off sided latch technique and placing infant in cross cradle hold. Infant latched on with a shallow latch for 10 mins. Mother was fit with a #20 nipple shield for back up if needed on the rt breast. Advised mother that the nipple shield is a barrier and if used she with have to be assured of good latch and observed milk in the shield. Mother receptive to all teaching. Mother has a DEBP at home. Advised mother to do good breast massage and ice as needed to prevent engorgement. ,mother to do cue base feeding and feed infant 8-12 times in 24 hours. Discussed cluster feeding. Mother receptive to all teaching. Suggested that she follow up with outpatient services and or [facility]. She is aware of available [facility] services .   Patient Name: Girl Analys Ryden RCBUL'A Date: 06/20/2017 Reason for consult: Follow-up assessment   Maternal Data    Feeding Feeding Type: Breast Fed Length of feed: 10 min(on and off)  LATCH Score Latch: Too sleepy or reluctant, no latch achieved, no sucking elicited.                 Interventions    Lactation Tools Discussed/Used Tools: Nipple Shields Nipple shield size: 20;24   Consult Status Consult Status: Complete    Darla Lesches 06/20/2017, 2:08 PM

## 2017-06-20 NOTE — Discharge Instructions (Signed)
Call MD for T>100.4, heavy vaginal bleeding, severe abdominal pain, headache, visual disturbance or respiratory distress.  Call office to schedule blood pressure check in 1 weeks.  Pelvic rest x 6 weeks.

## 2017-06-20 NOTE — Discharge Summary (Signed)
Obstetric Discharge Summary Reason for Admission: induction of labor Prenatal Procedures: none Intrapartum Procedures: spontaneous vaginal delivery Postpartum Procedures: none Complications-Operative and Postpartum: 2nd degree perineal laceration Hemoglobin  Date Value Ref Range Status  06/20/2017 10.5 (L) 12.0 - 15.0 g/dL Final   HCT  Date Value Ref Range Status  06/20/2017 30.4 (L) 36.0 - 46.0 % Final   Patient reports no HA, CP/SOB, RUQ pain or visual disturbance  PreE labs wnl   Physical Exam:  General: alert, cooperative and appears stated age 37: appropriate Uterine Fundus: firm Incision: healing well, no significant drainage, no dehiscence DVT Evaluation: No evidence of DVT seen on physical exam. Negative Homan's sign. No cords or calf tenderness.  Discharge Diagnoses: Term Pregnancy-delivered and gestational hypertension  Discharge Information: Date: 06/20/2017 Activity: pelvic rest Diet: routine Medications: PNV, Ibuprofen and labetalol Condition: stable Instructions: refer to practice specific booklet Discharge to: home   Newborn Data: Live born female  Birth Weight: 7 lb 6.9 oz (3371 g) APGAR: 8, 9  Newborn Delivery   Birth date/time:  06[birth year]/2017 20:51:00 Delivery type:  Vaginal, Spontaneous     Home with mother.  Kaylee Romero, [place] 06/20/2017, 8:48 AM

## 2017-07-02 ENCOUNTER — Inpatient Hospital Stay (HOSPITAL_COMMUNITY)
Admission: AD | Admit: 2017-07-02 | Payer: BC Managed Care – PPO | Source: Ambulatory Visit | Admitting: Obstetrics and Gynecology

## 2018-09-19 ENCOUNTER — Other Ambulatory Visit: Payer: Self-pay | Admitting: Obstetrics & Gynecology

## 2018-09-19 DIAGNOSIS — N6310 Unspecified lump in the right breast, unspecified quadrant: Secondary | ICD-10-CM

## 2018-09-23 ENCOUNTER — Ambulatory Visit
Admission: RE | Admit: 2018-09-23 | Discharge: 2018-09-23 | Disposition: A | Discharge status: Home / Self Care | Payer: 59 | Source: Ambulatory Visit | Attending: Obstetrics & Gynecology | Admitting: Obstetrics & Gynecology

## 2018-09-23 ENCOUNTER — Other Ambulatory Visit: Payer: Self-pay | Admitting: Obstetrics & Gynecology

## 2018-09-23 ENCOUNTER — Ambulatory Visit
Admission: RE | Admit: 2018-09-23 | Discharge: 2018-09-23 | Disposition: A | Discharge status: Home / Self Care | Payer: BC Managed Care – PPO | Source: Ambulatory Visit | Attending: Obstetrics & Gynecology | Admitting: Obstetrics & Gynecology

## 2018-09-23 ENCOUNTER — Other Ambulatory Visit: Payer: Self-pay

## 2018-09-23 DIAGNOSIS — N6310 Unspecified lump in the right breast, unspecified quadrant: Secondary | ICD-10-CM

## 2018-09-23 DIAGNOSIS — R928 Other abnormal and inconclusive findings on diagnostic imaging of breast: Secondary | ICD-10-CM

## 2018-09-23 IMAGING — US US AXILLARY LEFT
1 series · 7 of 7 positions shown · non-contrast
Comparison: None.

CLINICAL DATA: Patient presents with a palpable lump in the 12
o'clock position of the right breast. She has no family history of
breast carcinoma.

EXAM:
DIGITAL DIAGNOSTIC BILATERAL MAMMOGRAM WITH CAD AND TOMO
ULTRASOUND RIGHT BREAST
ULTRASOUND LEFT AXILLA

[Series 1: us axillary left · 0.07mm/px · 7 of 7 slices shown]
[im 1/7]
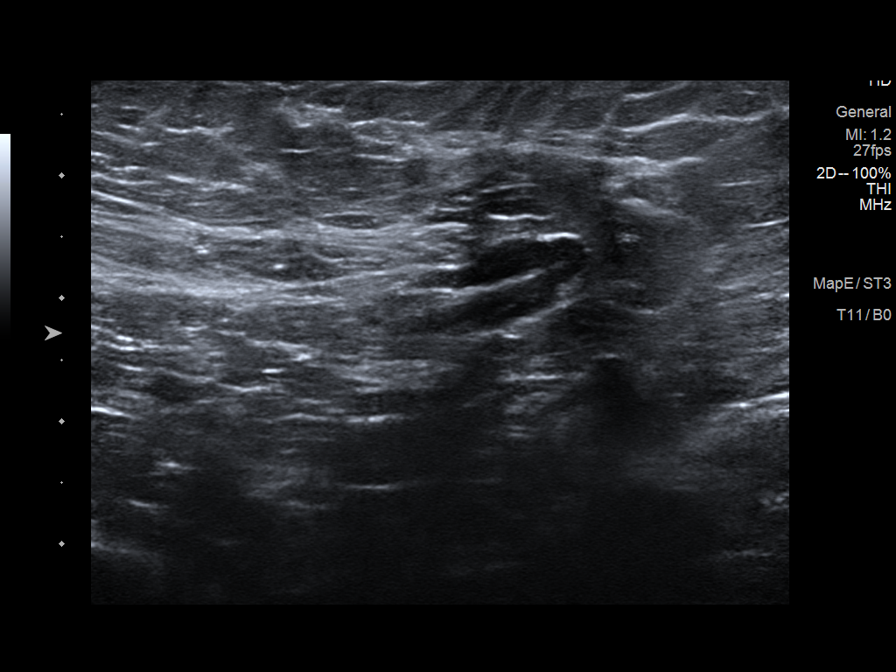
[im 2/7]
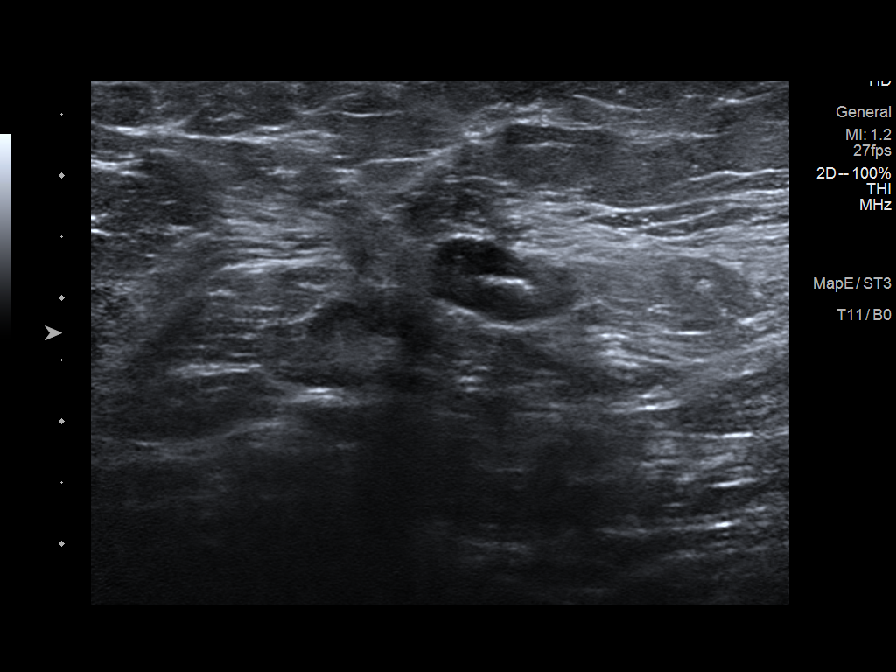
[im 3/7]
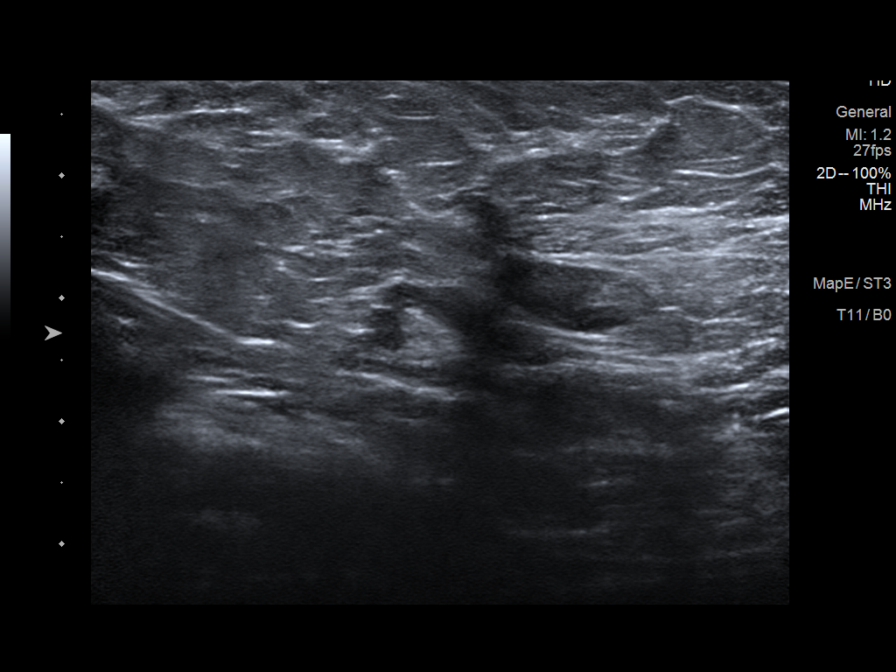
[im 4/7]
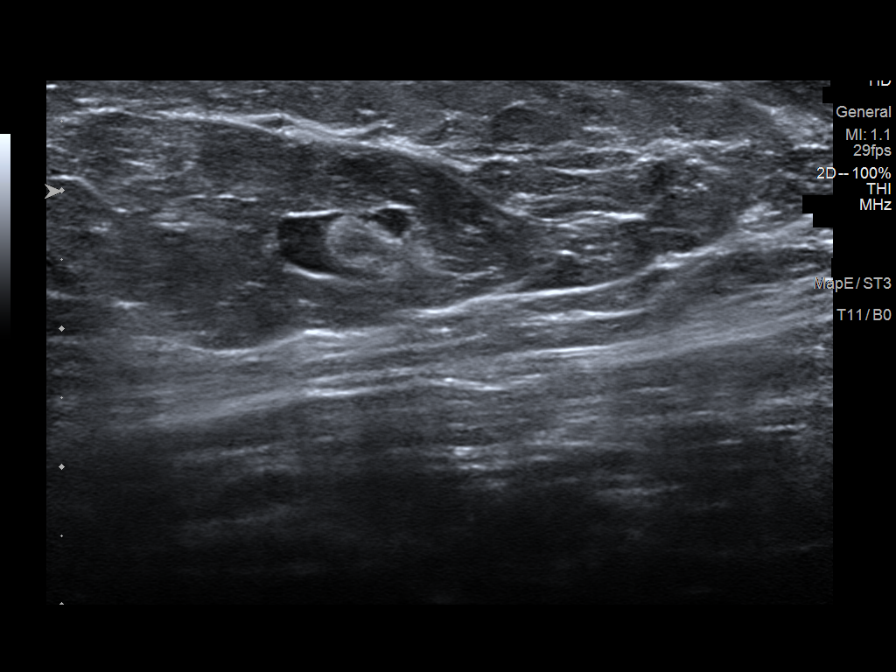
[im 5/7]
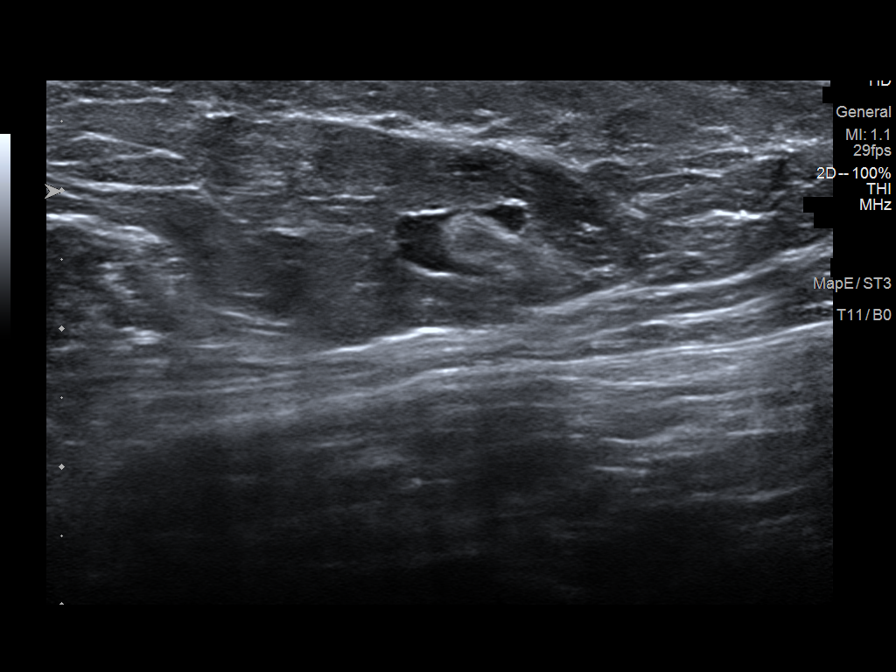
[im 6/7]
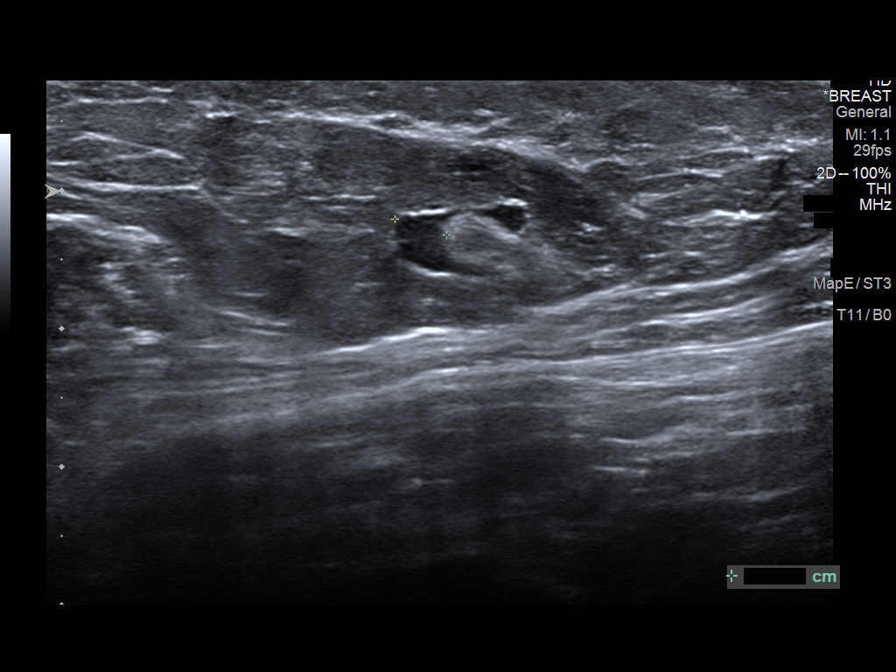
[im 7/7]
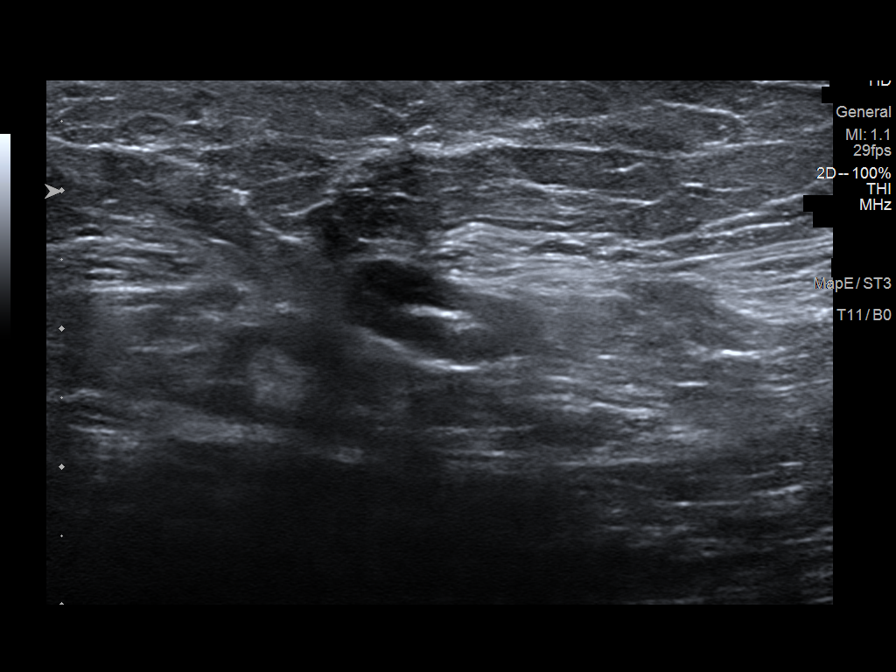

[7 of 7 positions shown; findings below may reference images not displayed]

ACR Breast Density Category c: The breast tissue is heterogeneously
dense, which may obscure small masses.
FINDINGS: In the right breast there is a spiculated mass with associated
architectural distortion and pleomorphic calcifications lying in the
11-12 o'clock position, middle depth, measuring 2 cm in size,
corresponding to the palpable abnormality.

There are no other breast masses, no other areas of architectural
distortion and no other suspicious calcifications.

There is a questionable abnormal lymph node in the left axilla.

Mammographic images were processed with CAD.

On physical exam, there is a firm but mobile mass in the 12 o'clock
position of the right breast.

Targeted ultrasound is performed, showing a hypoechoic irregular
mass with partly ill-defined partly angular margins, as well as
architectural distortion, and multiple internal echogenic foci
consistent with calcifications. Mass lies at 12 o'clock, 2 cm the
nipple measuring 2.1 x 1.0 x 1.9 cm. This corresponds to the
palpable abnormality.

Sonographic evaluation of the right axilla shows no enlarged or
abnormal lymph nodes. One deep right axillary lymph node shows
prominent cortex measuring between 3 and 4 mm.

Sonographic evaluation of the left axilla shows no enlarged lymph
nodes. Several lymph nodes show areas of cortical prominence,
greatest thickness between 3 and 4 mm, all similar to the lymph node
noted on the right. Given the symmetry of these lymph nodes, these
are presumed reactive.
IMPRESSION: 1. Highly suspicious 2.1 cm mass in the 12 o'clock position of right
breast. Tissue sampling is indicated.
2. No other evidence of breast malignancy.

RECOMMENDATION:
1. Ultrasound-guided core needle biopsy of the right breast mass.
This has been scheduled for [DATE] a.m. on [DATE].

I have discussed the findings and recommendations with the patient.
If applicable, a reminder letter will be sent to the patient
regarding the next appointment.

BI-RADS CATEGORY  5: Highly suggestive of malignancy.

## 2018-09-23 IMAGING — MG MM DIGITAL DIAGNOSTIC BILAT W/ TOMO W/ CAD
6 of 10 series · 6 of 30 positions shown · non-contrast
Comparison: None.

CLINICAL DATA: Patient presents with a palpable lump in the 12
o'clock position of the right breast. She has no family history of
breast carcinoma.

EXAM:
DIGITAL DIAGNOSTIC BILATERAL MAMMOGRAM WITH CAD AND TOMO
ULTRASOUND RIGHT BREAST
ULTRASOUND LEFT AXILLA

[R TAN synth-2D]
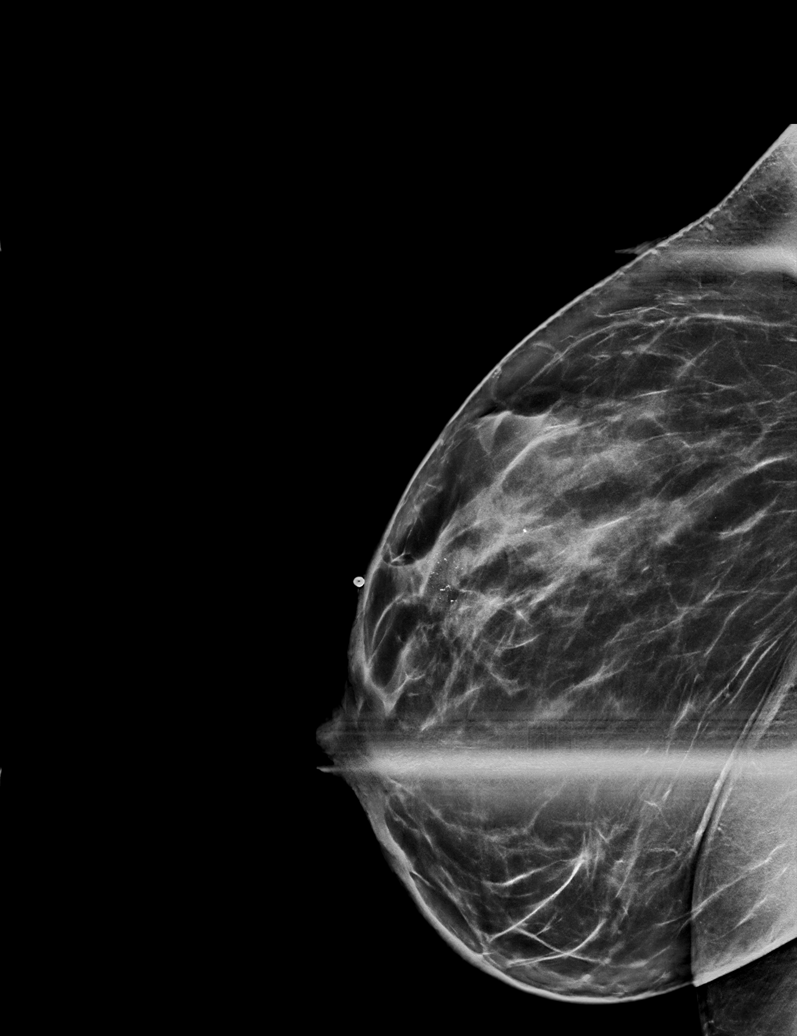

[R CC synth-2D]
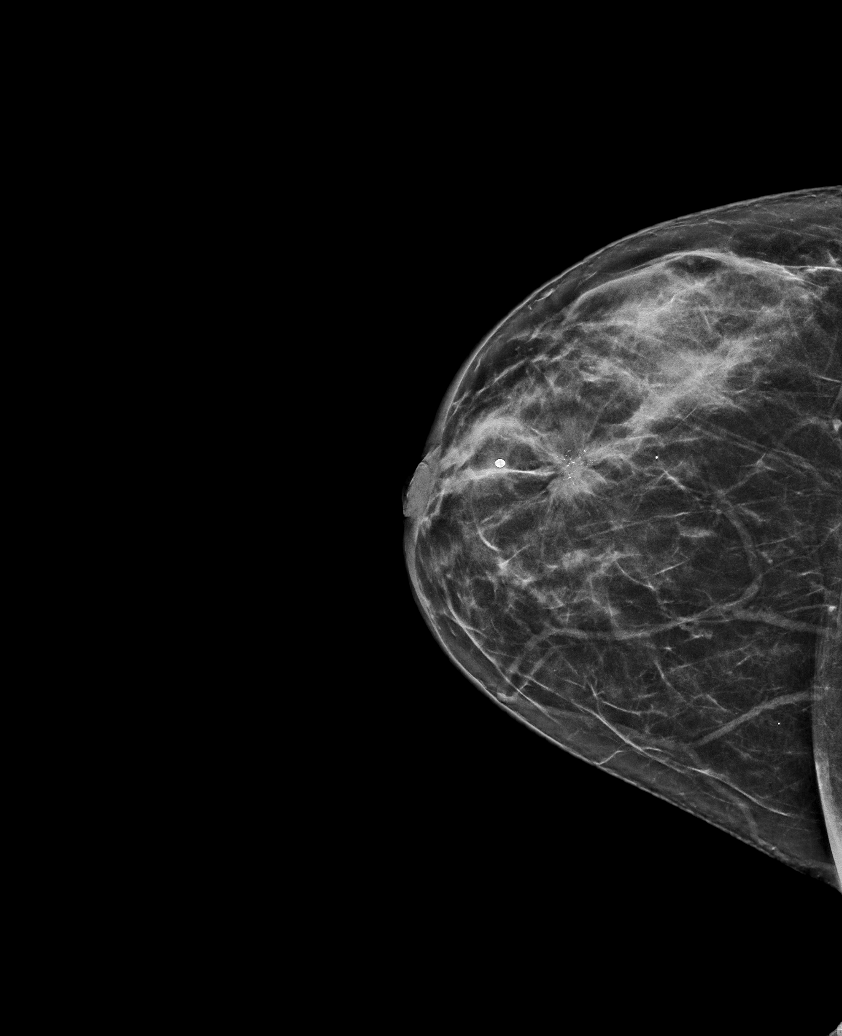

[L MLO synth-2D]
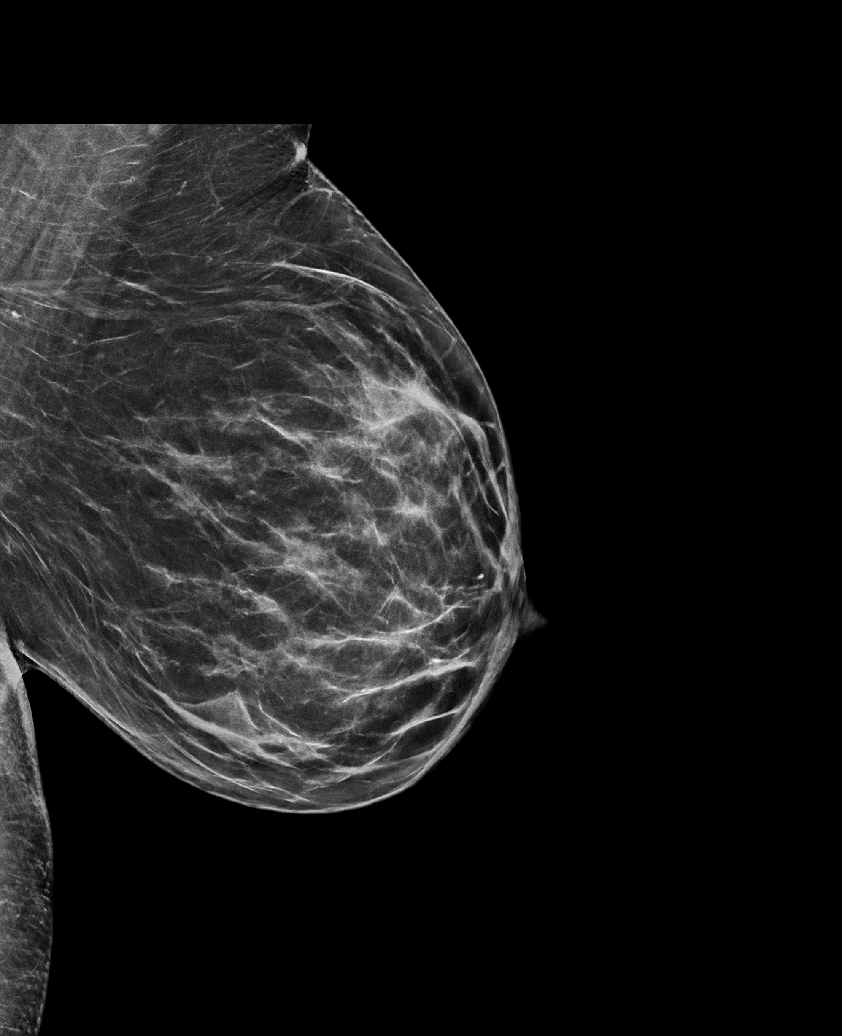

[L CC synth-2D]
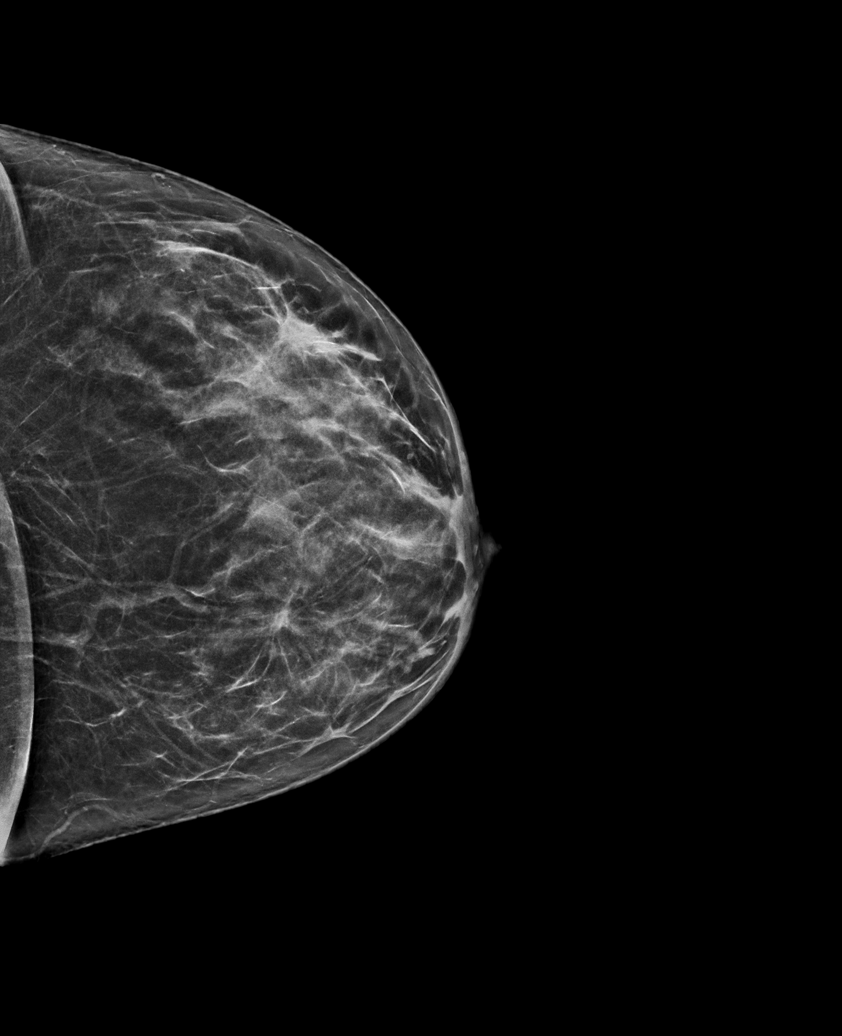

[R MLO synth-2D]
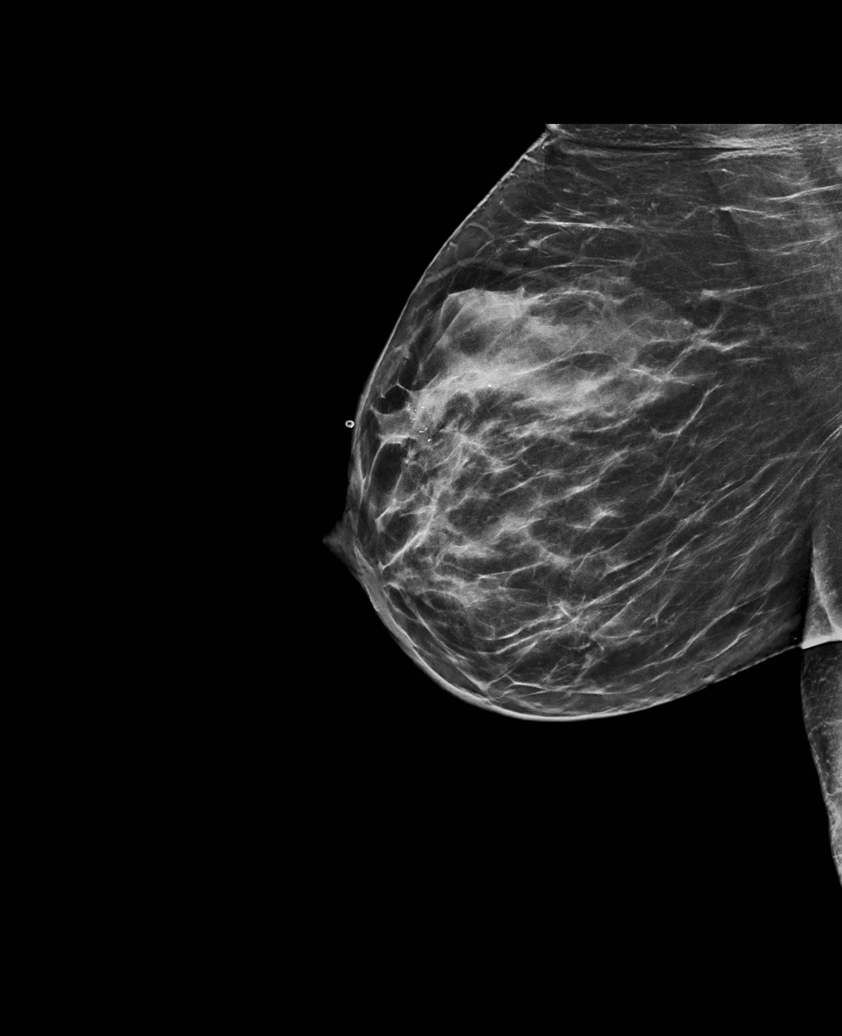

[R MLO tomo · tomo slice 37/72.0]
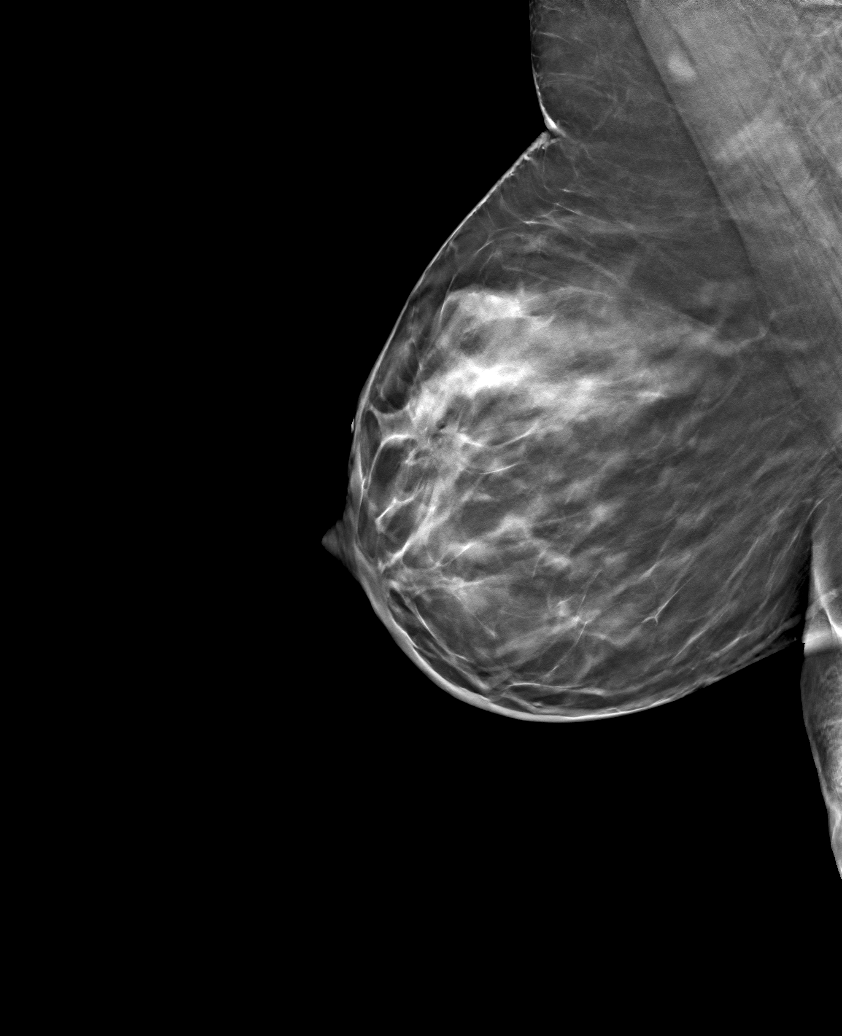

[6 of 30 positions shown; findings below may reference images not displayed]

ACR Breast Density Category c: The breast tissue is heterogeneously
dense, which may obscure small masses.
FINDINGS: In the right breast there is a spiculated mass with associated
architectural distortion and pleomorphic calcifications lying in the
11-12 o'clock position, middle depth, measuring 2 cm in size,
corresponding to the palpable abnormality.

There are no other breast masses, no other areas of architectural
distortion and no other suspicious calcifications.

There is a questionable abnormal lymph node in the left axilla.

Mammographic images were processed with CAD.

On physical exam, there is a firm but mobile mass in the 12 o'clock
position of the right breast.

Targeted ultrasound is performed, showing a hypoechoic irregular
mass with partly ill-defined partly angular margins, as well as
architectural distortion, and multiple internal echogenic foci
consistent with calcifications. Mass lies at 12 o'clock, 2 cm the
nipple measuring 2.1 x 1.0 x 1.9 cm. This corresponds to the
palpable abnormality.

Sonographic evaluation of the right axilla shows no enlarged or
abnormal lymph nodes. One deep right axillary lymph node shows
prominent cortex measuring between 3 and 4 mm.

Sonographic evaluation of the left axilla shows no enlarged lymph
nodes. Several lymph nodes show areas of cortical prominence,
greatest thickness between 3 and 4 mm, all similar to the lymph node
noted on the right. Given the symmetry of these lymph nodes, these
are presumed reactive.
IMPRESSION: 1. Highly suspicious 2.1 cm mass in the 12 o'clock position of right
breast. Tissue sampling is indicated.
2. No other evidence of breast malignancy.

RECOMMENDATION:
1. Ultrasound-guided core needle biopsy of the right breast mass.
This has been scheduled for [DATE] a.m. on [DATE].

I have discussed the findings and recommendations with the patient.
If applicable, a reminder letter will be sent to the patient
regarding the next appointment.

BI-RADS CATEGORY  5: Highly suggestive of malignancy.

## 2018-09-23 IMAGING — US US BREAST*R* LIMITED INC AXILLA
1 series · 13 of 18 positions shown · non-contrast
Comparison: None.

CLINICAL DATA: Patient presents with a palpable lump in the 12
o'clock position of the right breast. She has no family history of
breast carcinoma.

EXAM:
DIGITAL DIAGNOSTIC BILATERAL MAMMOGRAM WITH CAD AND TOMO
ULTRASOUND RIGHT BREAST
ULTRASOUND LEFT AXILLA

[Series 1: us breast*right* limited inc axilla · 0.07mm/px · 13 of 18 slices shown]
[im 1/18]
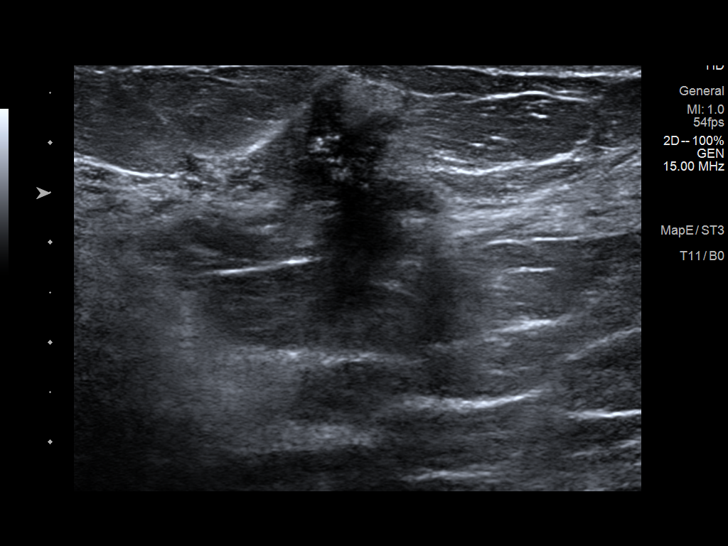
[im 3/18]
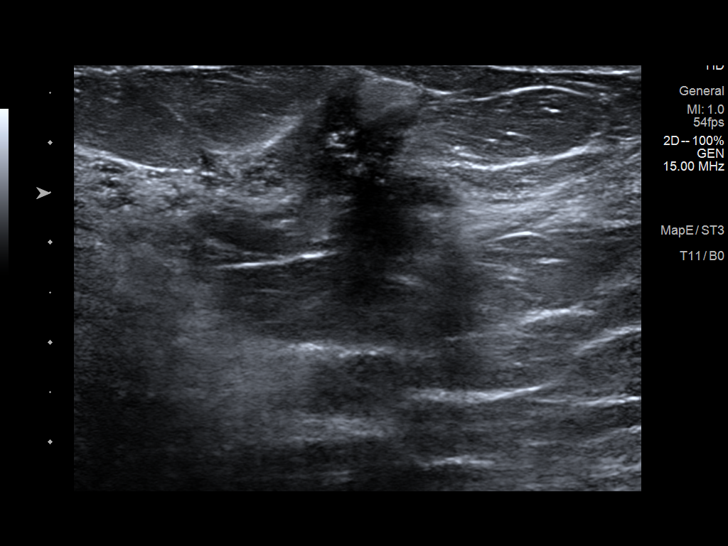
[im 4/18]
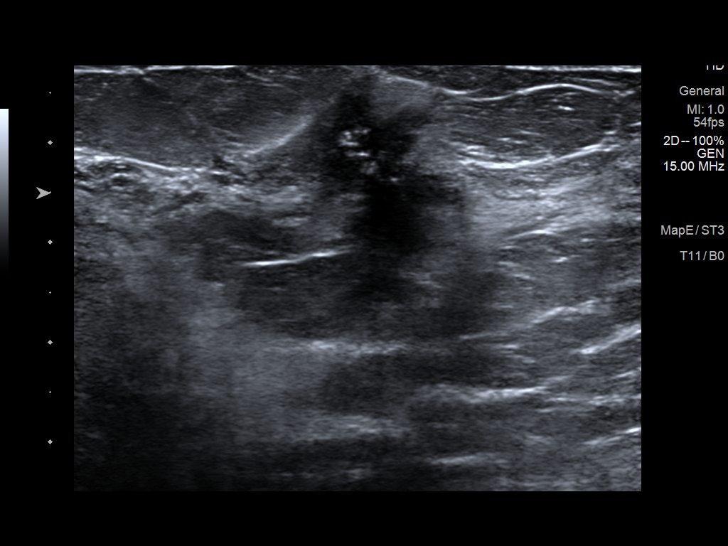
[im 5/18]
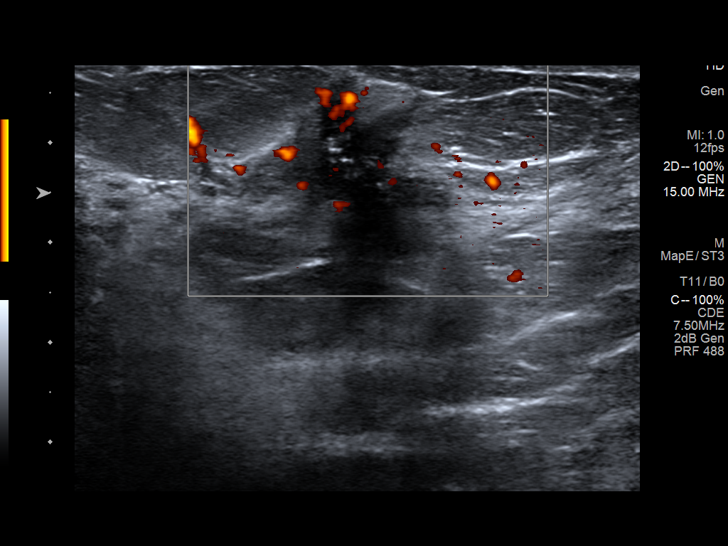
[im 7/18]
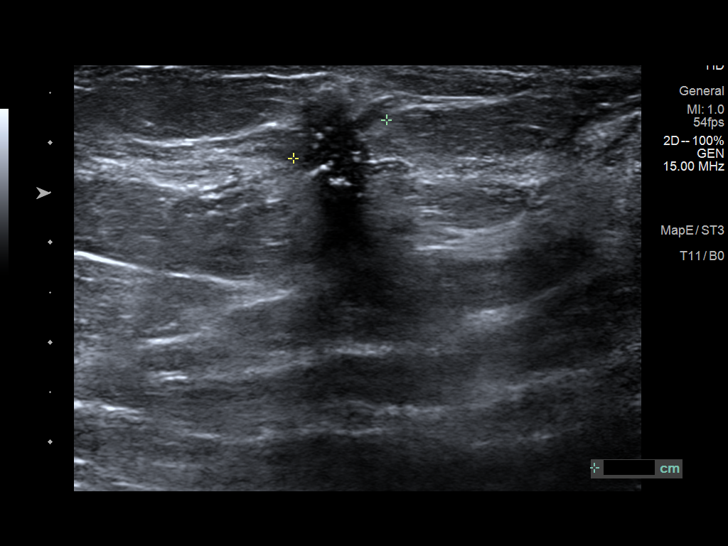
[im 8/18]
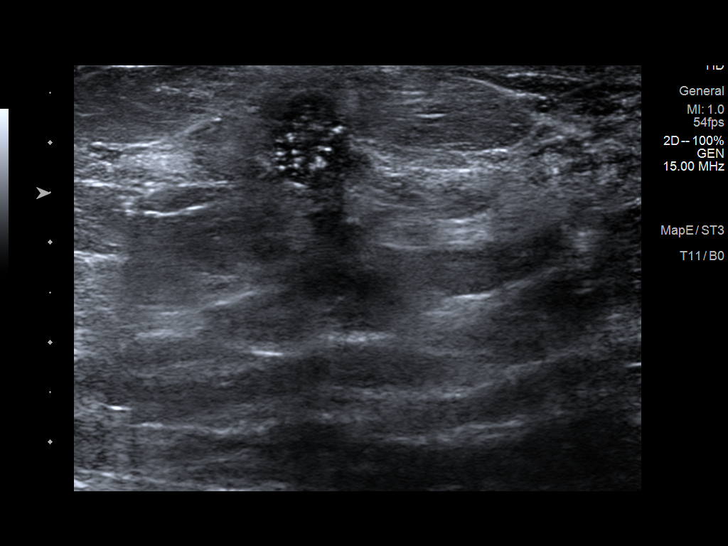
[im 10/18]
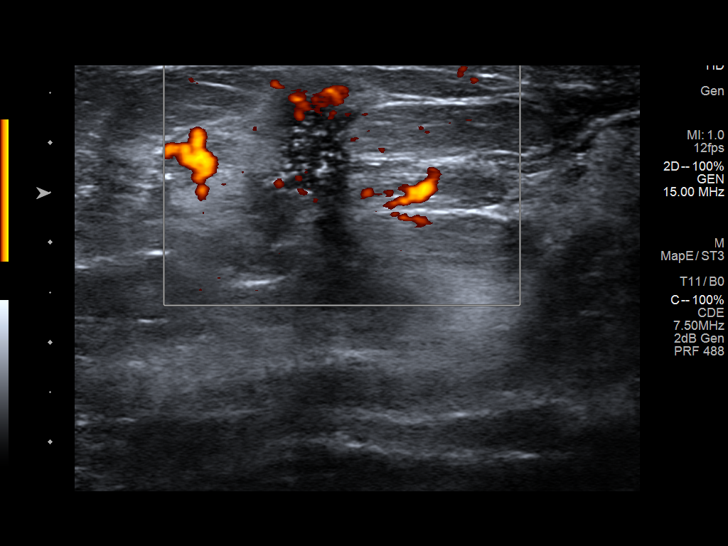
[im 11/18]
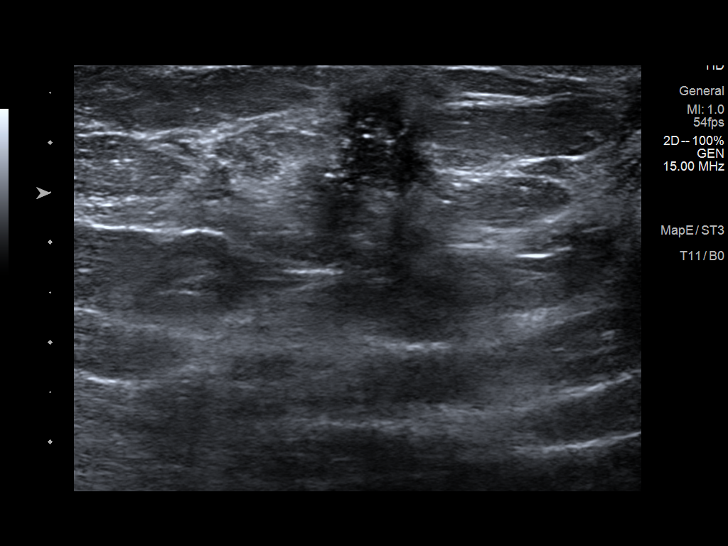
[im 12/18]
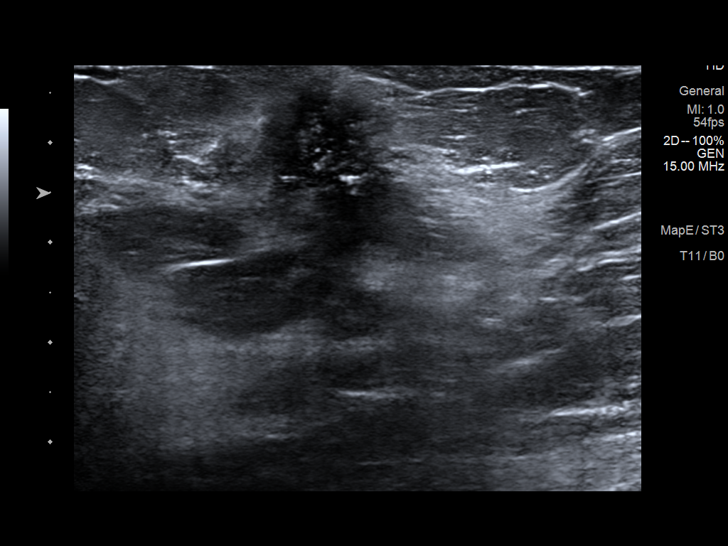
[im 14/18]
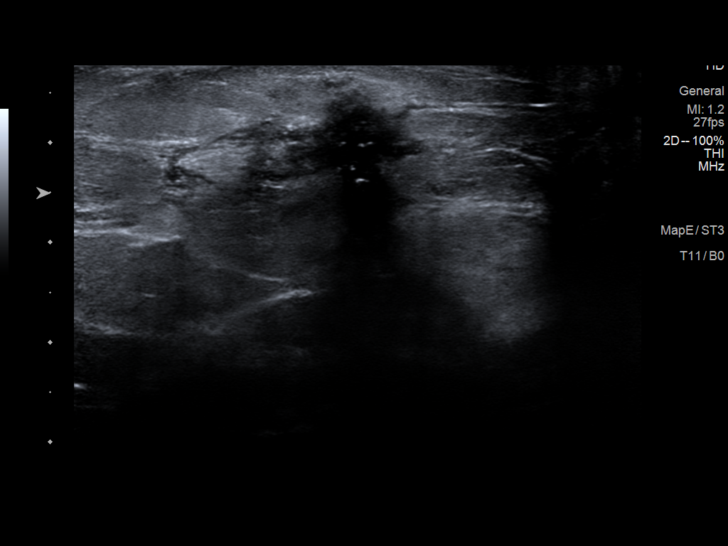
[im 15/18]
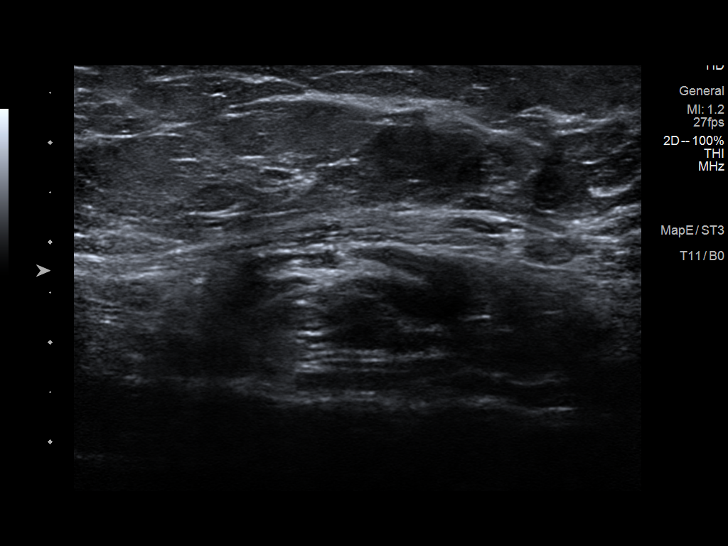
[im 16/18]
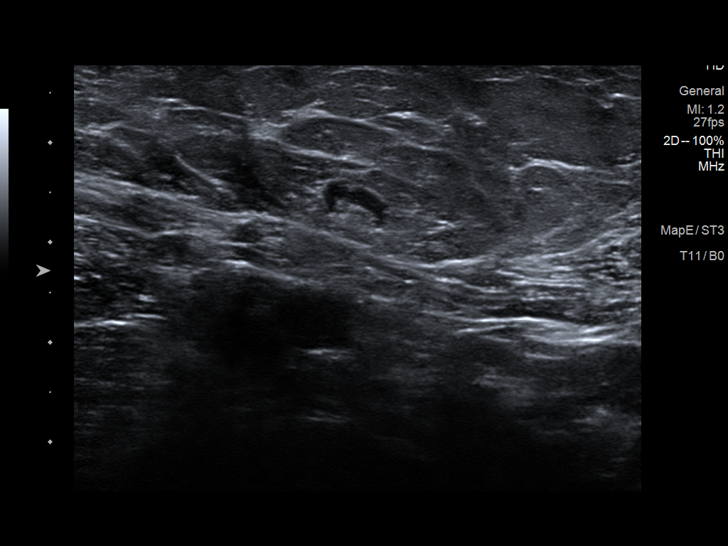
[im 18/18]
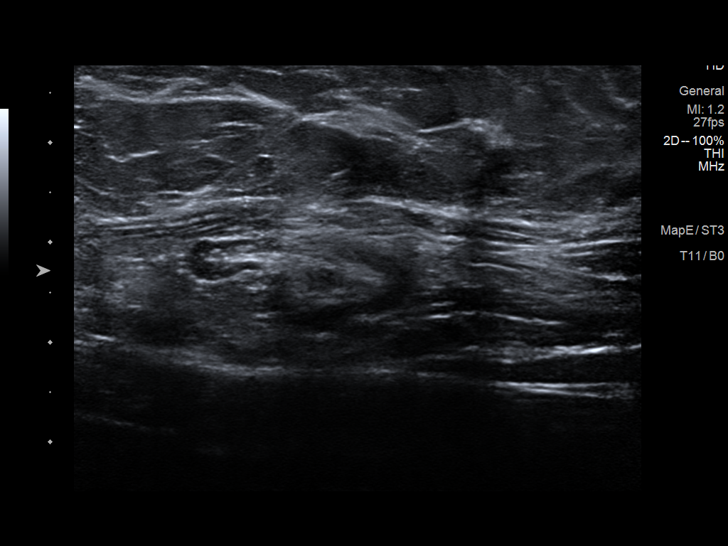

[13 of 18 positions shown; findings below may reference images not displayed]

ACR Breast Density Category c: The breast tissue is heterogeneously
dense, which may obscure small masses.
FINDINGS: In the right breast there is a spiculated mass with associated
architectural distortion and pleomorphic calcifications lying in the
11-12 o'clock position, middle depth, measuring 2 cm in size,
corresponding to the palpable abnormality.

There are no other breast masses, no other areas of architectural
distortion and no other suspicious calcifications.

There is a questionable abnormal lymph node in the left axilla.

Mammographic images were processed with CAD.

On physical exam, there is a firm but mobile mass in the 12 o'clock
position of the right breast.

Targeted ultrasound is performed, showing a hypoechoic irregular
mass with partly ill-defined partly angular margins, as well as
architectural distortion, and multiple internal echogenic foci
consistent with calcifications. Mass lies at 12 o'clock, 2 cm the
nipple measuring 2.1 x 1.0 x 1.9 cm. This corresponds to the
palpable abnormality.

Sonographic evaluation of the right axilla shows no enlarged or
abnormal lymph nodes. One deep right axillary lymph node shows
prominent cortex measuring between 3 and 4 mm.

Sonographic evaluation of the left axilla shows no enlarged lymph
nodes. Several lymph nodes show areas of cortical prominence,
greatest thickness between 3 and 4 mm, all similar to the lymph node
noted on the right. Given the symmetry of these lymph nodes, these
are presumed reactive.
IMPRESSION: 1. Highly suspicious 2.1 cm mass in the 12 o'clock position of right
breast. Tissue sampling is indicated.
2. No other evidence of breast malignancy.

RECOMMENDATION:
1. Ultrasound-guided core needle biopsy of the right breast mass.
This has been scheduled for [DATE] a.m. on [DATE].

I have discussed the findings and recommendations with the patient.
If applicable, a reminder letter will be sent to the patient
regarding the next appointment.

BI-RADS CATEGORY  5: Highly suggestive of malignancy.

## 2018-09-26 ENCOUNTER — Other Ambulatory Visit: Payer: Self-pay | Admitting: Radiology

## 2018-09-26 ENCOUNTER — Ambulatory Visit
Admission: RE | Admit: 2018-09-26 | Discharge: 2018-09-26 | Disposition: A | Discharge status: Home / Self Care | Payer: BC Managed Care – PPO | Source: Ambulatory Visit | Attending: Obstetrics & Gynecology | Admitting: Obstetrics & Gynecology

## 2018-09-26 ENCOUNTER — Other Ambulatory Visit: Payer: Self-pay

## 2018-09-26 DIAGNOSIS — N6310 Unspecified lump in the right breast, unspecified quadrant: Secondary | ICD-10-CM

## 2018-09-26 IMAGING — US US  BREAST BX W/ LOC DEV 1ST LESION IMG BX SPEC US GUIDE*R*
1 series · 10 of 10 positions shown · non-contrast
Comparison: Previous exam(s).
COMPARISON: Previous exam(s).

Addendum:
CLINICAL DATA: Biopsy of a right breast mass

EXAM:
ULTRASOUND GUIDED RIGHT BREAST CORE NEEDLE BIOPSY

[Series 1: us breast bx w/ loc dev 1st lesion img bx spec us  · 0.07mm/px · 10 of 10 slices shown]
[im 1/10]
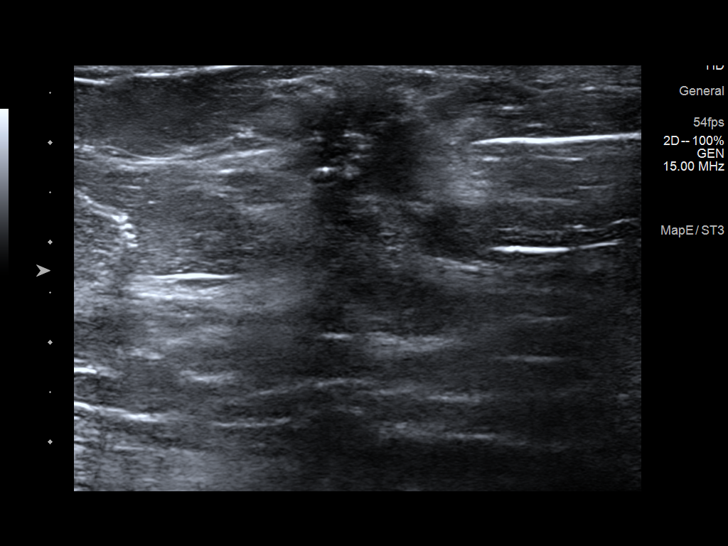
[im 2/10]
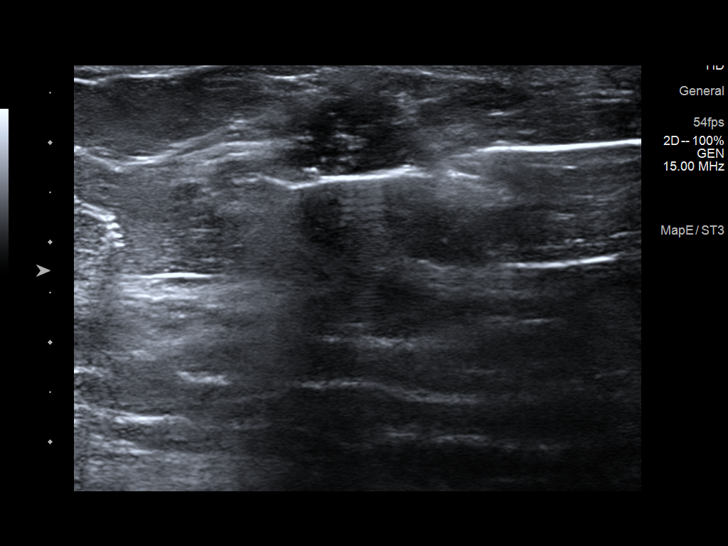
[im 3/10]
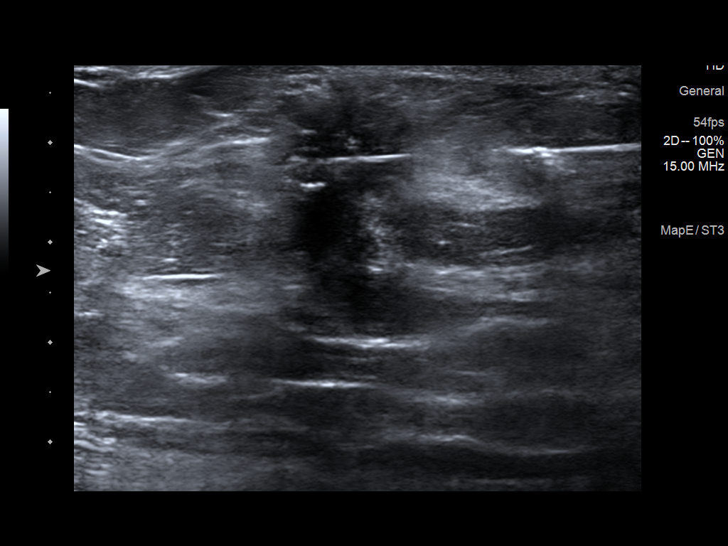
[im 4/10]
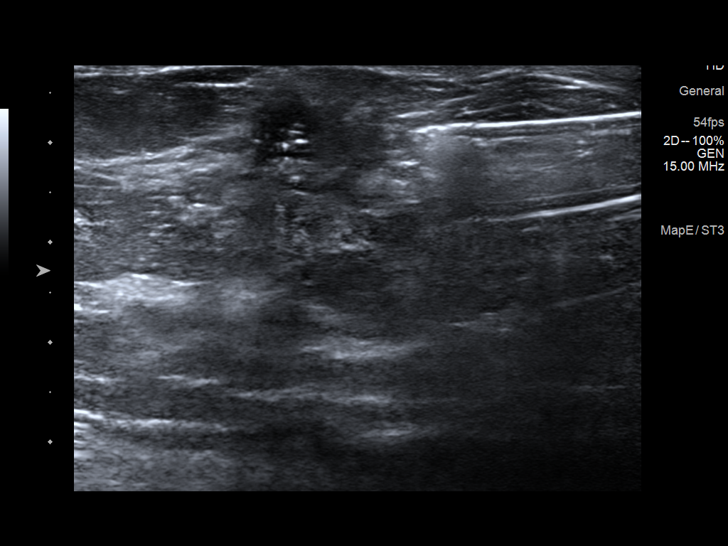
[im 5/10]
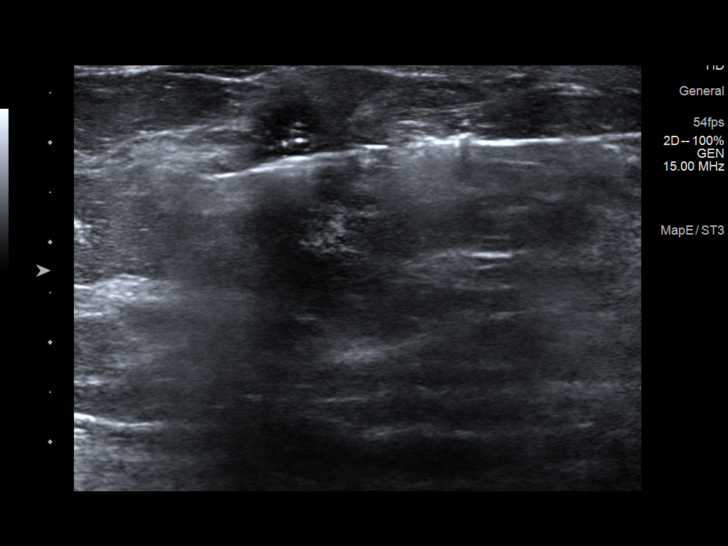
[im 6/10]
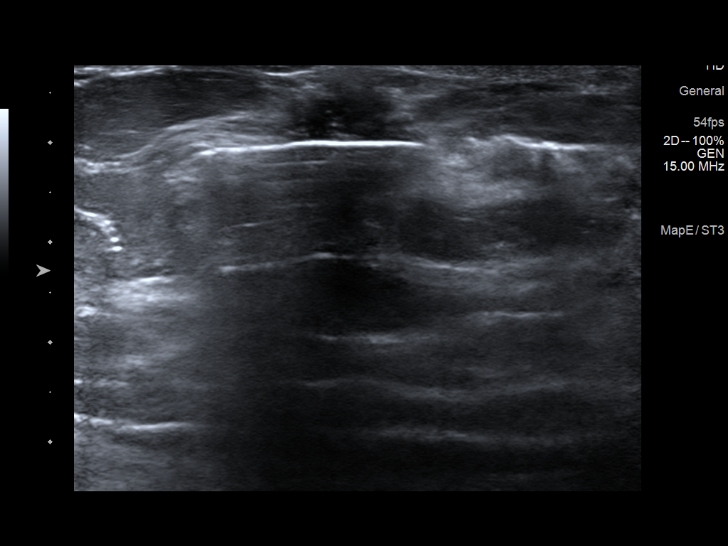
[im 7/10]
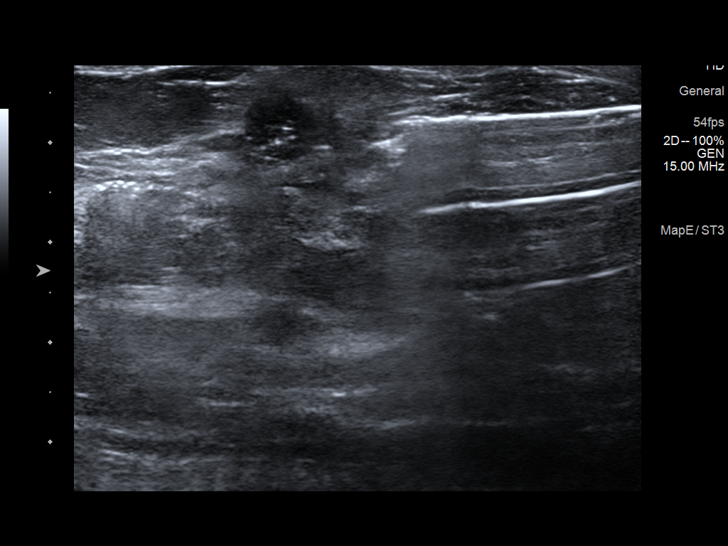
[im 8/10]
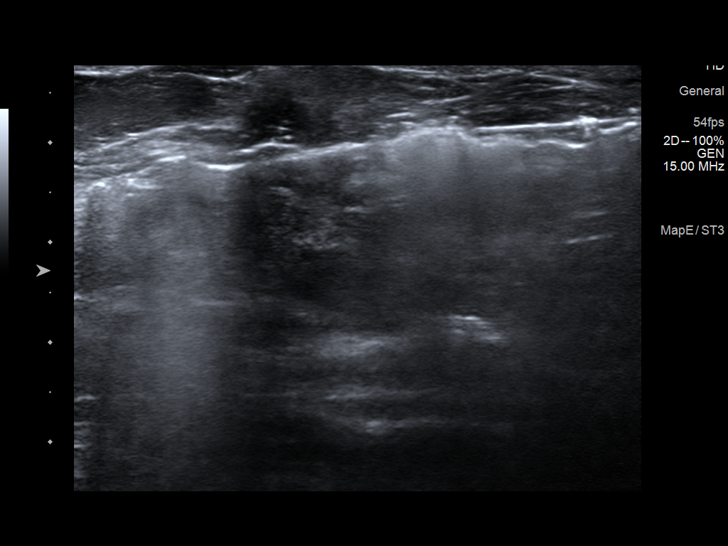
[im 9/10]
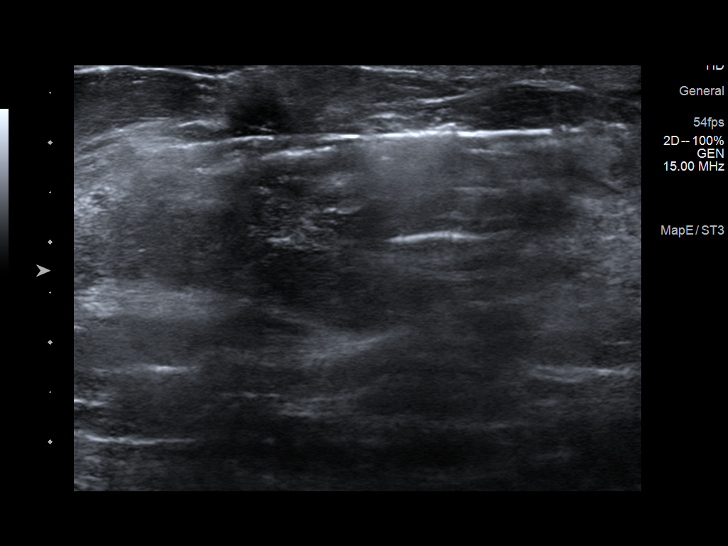
[im 10/10]
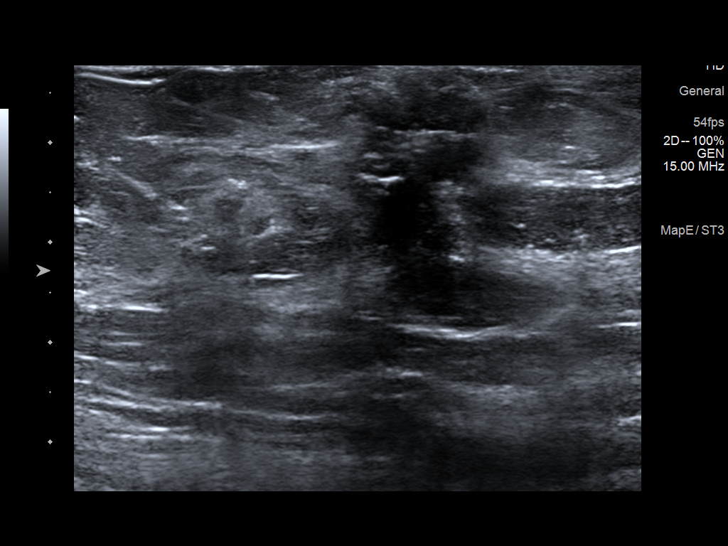

[10 of 10 positions shown; findings below may reference images not displayed]



Lesion quadrant: 12 o'clock

Using sterile technique and 1% Lidocaine as local anesthetic, under
direct ultrasound visualization, a 12 gauge LABRINA device was
used to perform biopsy of the 12 o'clock right breast mass using a
lateral approach. At the conclusion of the procedure a ribbon shaped
tissue marker clip was deployed into the biopsy cavity. Follow up 2
view mammogram was performed and dictated separately.
IMPRESSION: Ultrasound guided biopsy of the 12 o'clock right breast mass. No
apparent complications.

ADDENDUM:
Pathology revealed GRADE II INVASIVE DUCTAL CARCINOMA, DUCTAL
CARCINOMA IN SITU of the RIGHT breast, 12 o'clock. This was found to
be concordant by Dr. LABRINA.

Pathology results were discussed with the patient by telephone. The
patient reported doing well after the biopsy with tenderness at the
site. Post biopsy instructions and care were reviewed and questions
were answered. The patient was encouraged to call The [REDACTED]

The patient was referred to [REDACTED]
[REDACTED] at [REDACTED] on
[DATE].

Pathology results reported by LABRINA, RN on [DATE].



Lesion quadrant: 12 o'clock

Using sterile technique and 1% Lidocaine as local anesthetic, under
direct ultrasound visualization, a 12 gauge LABRINA device was
used to perform biopsy of the 12 o'clock right breast mass using a
lateral approach. At the conclusion of the procedure a ribbon shaped
tissue marker clip was deployed into the biopsy cavity. Follow up 2
view mammogram was performed and dictated separately.
IMPRESSION: Ultrasound guided biopsy of the 12 o'clock right breast mass. No
apparent complications.

## 2018-09-26 IMAGING — MG MM BREAST LOCALIZATION CLIP
4 series · 4 of 12 positions shown · non-contrast
Comparison: Previous exam(s).

CLINICAL DATA: Evaluate biopsy marker

EXAM:
DIAGNOSTIC RIGHT MAMMOGRAM POST ULTRASOUND BIOPSY

[R ML synth-2D]
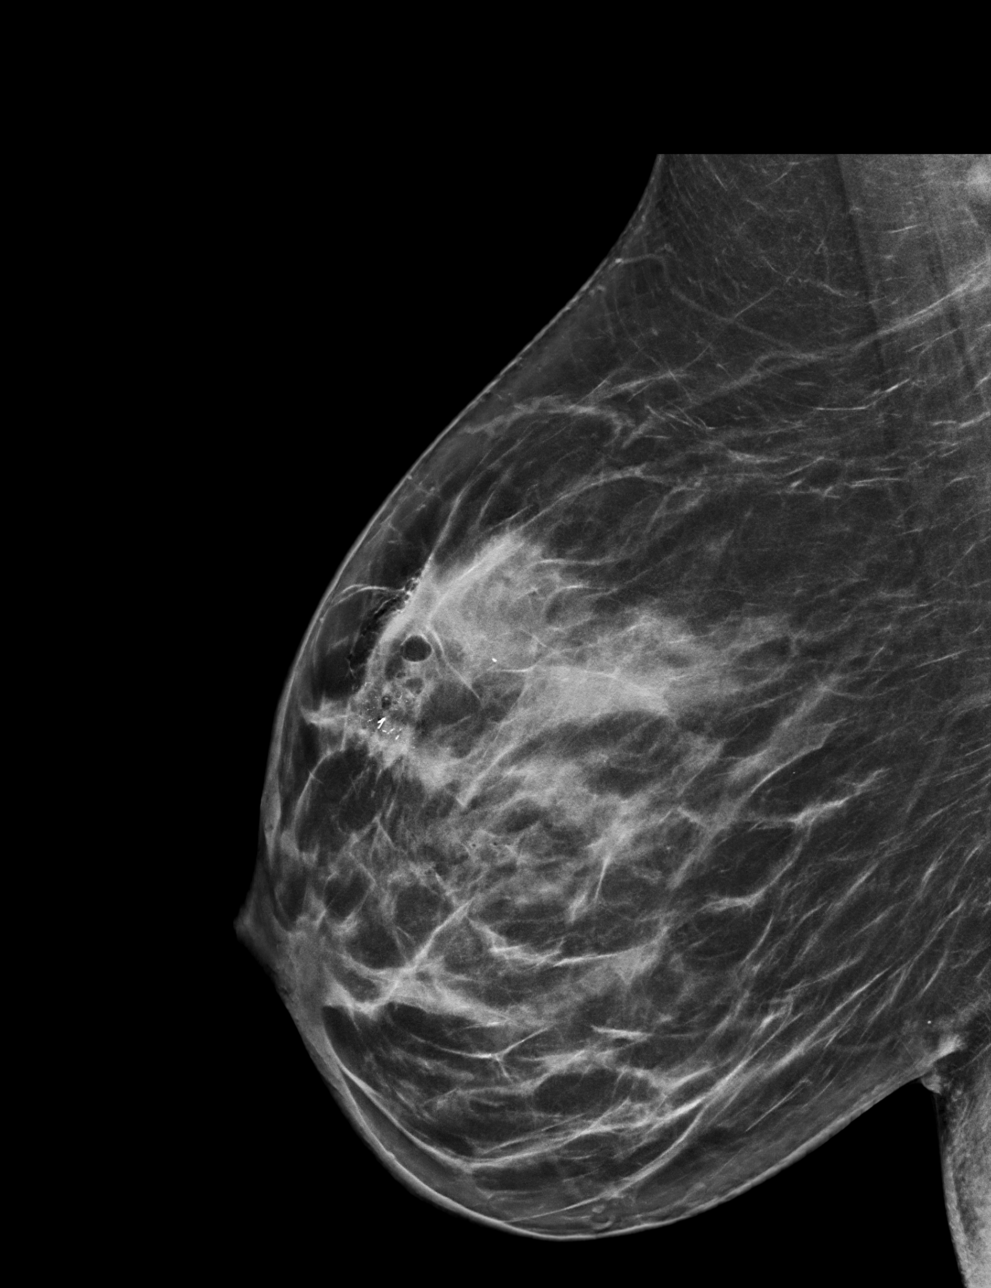

[R CC synth-2D]
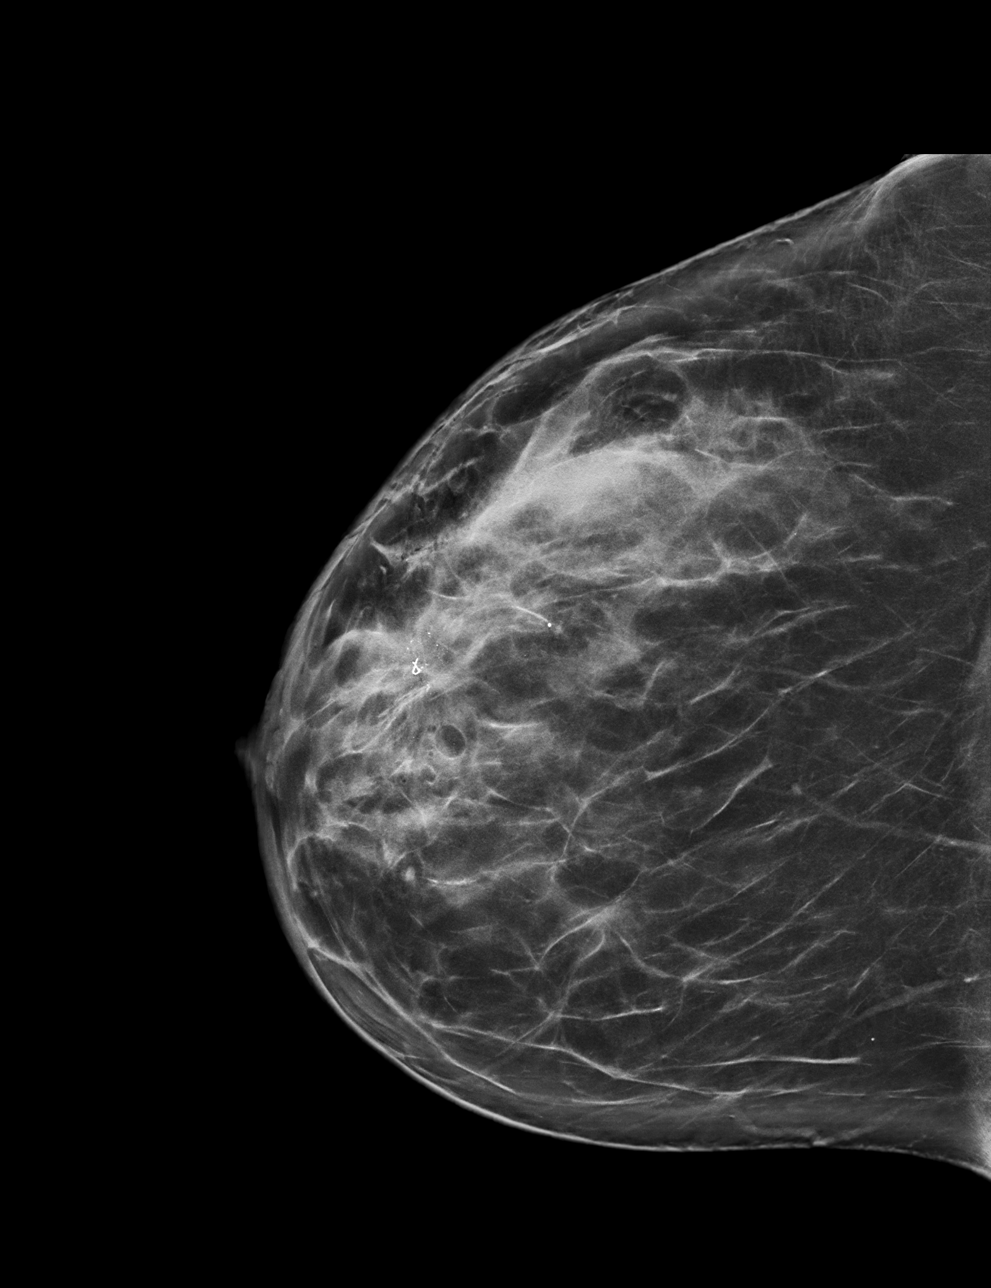

[R CC tomo · tomo slice 39/76.0]
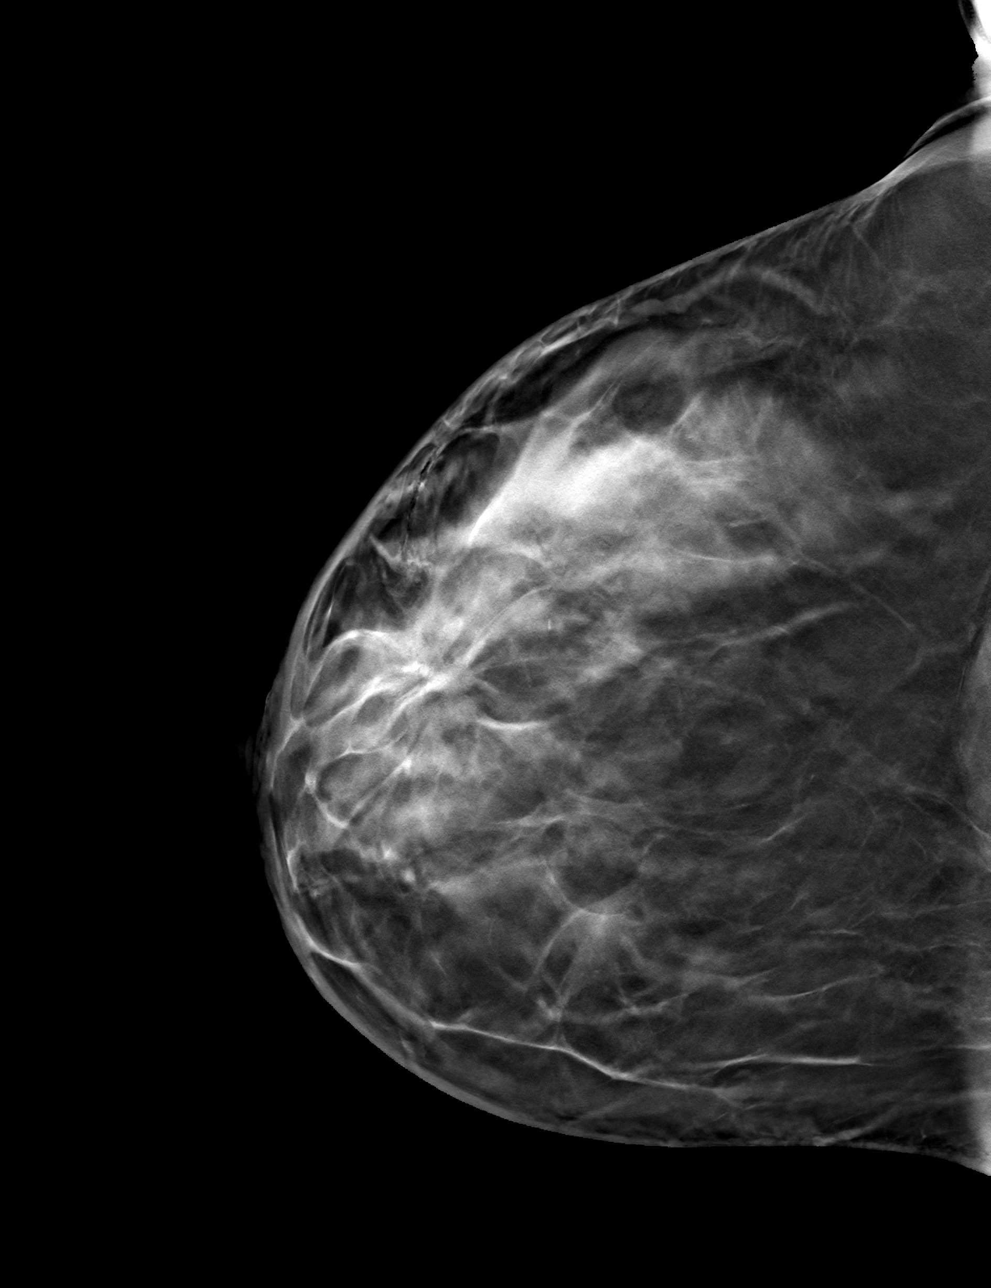

[R ML tomo · tomo slice 36/71.0]
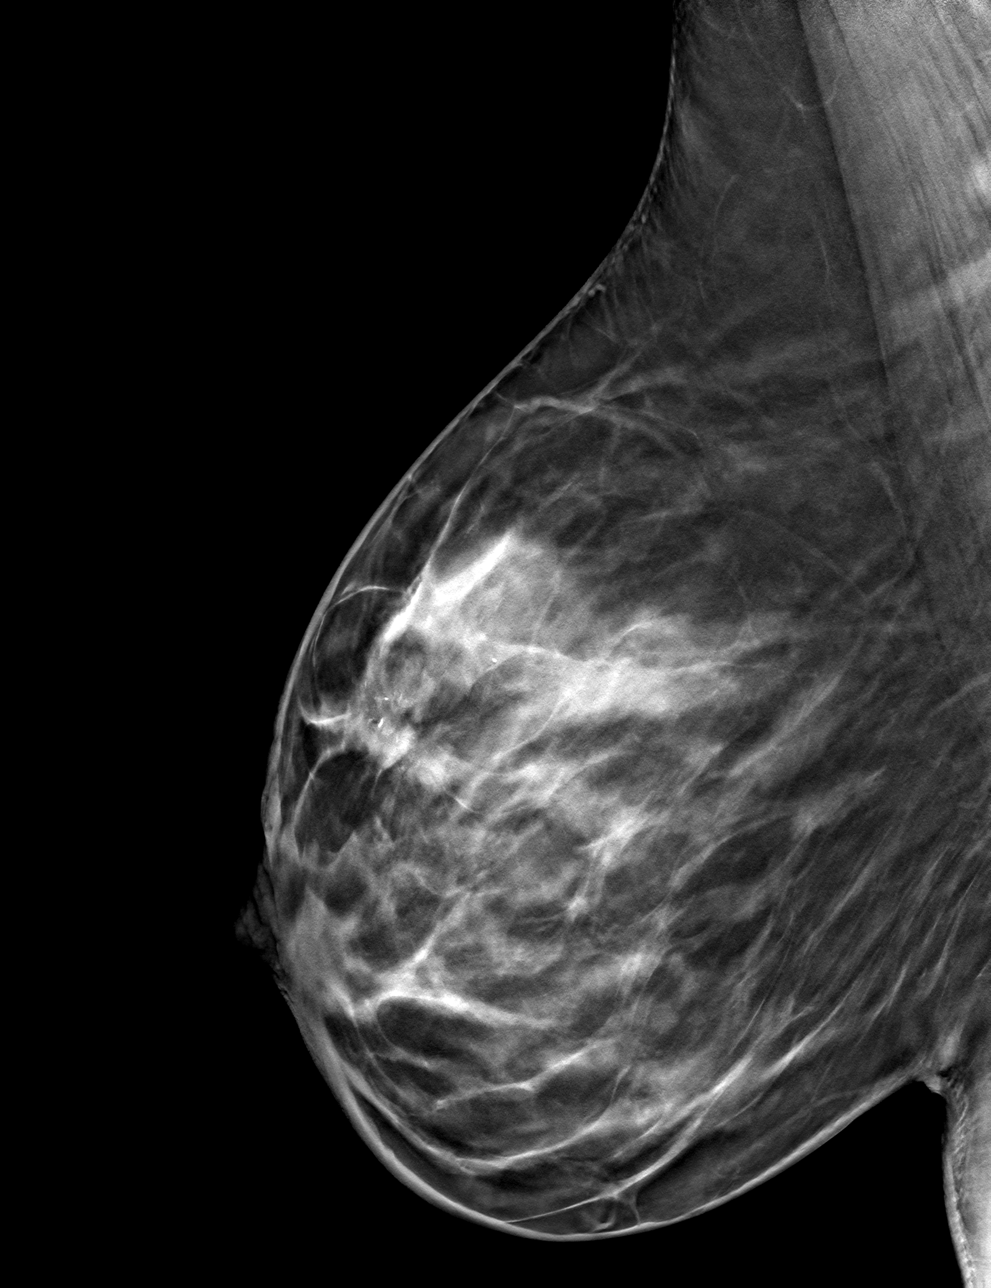

[4 of 12 positions shown; findings below may reference images not displayed]

FINDINGS: Mammographic images were obtained following ultrasound guided biopsy
of a right breast mass. The ribbon shaped clip is within the
biopsied mass.
IMPRESSION: The ribbon shaped clip is within the biopsied mass.

Final Assessment: Post Procedure Mammograms for Marker Placement

## 2018-09-27 ENCOUNTER — Encounter: Payer: Self-pay | Admitting: *Deleted

## 2018-09-27 ENCOUNTER — Telehealth: Payer: Self-pay | Admitting: Oncology

## 2018-09-27 NOTE — Telephone Encounter (Signed)
Spoke to patient to confirm afternoon [facility] appointment for 9/30, packet emailed to patient

## 2018-09-29 ENCOUNTER — Encounter: Payer: Self-pay | Admitting: *Deleted

## 2018-09-29 DIAGNOSIS — C50411 Malignant neoplasm of upper-outer quadrant of right female breast: Secondary | ICD-10-CM | POA: Insufficient documentation

## 2018-09-29 DIAGNOSIS — Z170 Estrogen receptor positive status [ER+]: Secondary | ICD-10-CM

## 2018-10-04 NOTE — Progress Notes (Signed)
[facility]  [phone] Fax:([phone]) [phone]     ID: Kaylee Romero DOB: Feb 07, 1980  MR#: [id number]  KAJ#:[id number]  Patient Care Team: Linda Hedges, DO as PCP - General (Obstetrics and Gynecology) Mauro Kaufmann, RN as Oncology Nurse Navigator Rockwell Germany, RN as Oncology Nurse Navigator Gery Pray, MD as Consulting Physician (Radiation Oncology) , Virgie Dad, MD as Consulting Physician (Oncology) Jovita Kussmaul, MD as Consulting Physician (General Surgery) Chauncey Cruel, MD OTHER MD:  CHIEF COMPLAINT: estrogen receptor positive breast cancer  CURRENT TREATMENT: Awaiting definitive surgery   HISTORY OF CURRENT ILLNESS: Kaylee Romero (pronounced "Bram-Hall") found a palpable lump in the 12 o'clock position of the right breast, which he brought to the attention of her gynecologist.. She underwent bilateral diagnostic mammography with tomography and bilateral breast ultrasonography at [facility] on 09/23/2018 showing: breast density category C; a spiculated mass with associated with architectural distortion and pleomorphic calcifications lying in the 11-12 o'clock position, middle depth, measuring 2 cm in size, corresponding to the palpable abnormality.   Physical exam confirmed a firm but mobile mass in the 12 o'clock position of the right breast.  By ultrasound there was a highly suspicious 2.1 cm mass in the 12 o'clock position of the right breast; no other evidence of breast malignancy or lymphadenopathy bilaterally.Sonographic evaluation of the right axilla found no enlarged or abnormal appearing lymph node  In the left axilla there were several lymph nodes with areas of cortical prominence similar to the lymph node noted on the right.  Given the symmetry all these are presumed to be reactive  Accordingly on 09/26/2018 she proceeded to biopsy of the right breast area in question. The pathology from this procedure ([id number]) showed:  invasive ductal carcinoma, grade 2; prognostic indicators significant for: estrogen receptor, 80% positive with moderate staining intensity and progesterone receptor, 90% positive with strong staining intensity. Proliferation marker Ki67 at 35%. HER2 negative by immunohistochemistry (1+).  The patient's subsequent history is as detailed below.   INTERVAL HISTORY: Kaylee Romero was evaluated in the multidisciplinary breast cancer clinic on 10/05/2018  Her case was also presented at the multidisciplinary breast cancer conference on the same day. At that time a preliminary plan was proposed: MammaPrint, adjuvant radiation and antiestrogens, together with genetics counseling, consideration of goserelin, and discussion regarding surgical options   REVIEW OF SYSTEMS: The patient denies unusual headaches, visual changes, nausea, vomiting, stiff neck, dizziness, or gait imbalance. There has been no cough, phlegm production, or pleurisy, no chest pain or pressure, and no change in bowel or bladder habits. The patient denies fever, rash, bleeding, unexplained fatigue or unexplained weight loss.  She is currently not exercising regularly.  A detailed review of systems was otherwise entirely negative.   PAST MEDICAL HISTORY: Past Medical History:  Diagnosis Date   Anxiety    HSV (herpes simplex virus) anogenital infection    positive titer only    PAST SURGICAL HISTORY: Past Surgical History:  Procedure Laterality Date   TONSILLECTOMY  65   TONSILLECTOMY     WISDOM TOOTH EXTRACTION      FAMILY HISTORY: Family History  Problem Relation Age of Onset   Hypertension Father    Alcohol abuse Paternal Grandfather    Heart disease Paternal Grandfather    Alcohol abuse Maternal Grandfather    Heart disease Maternal Grandmother    Breast cancer Other    Patient's parents are both living at age 53, as of 09/2018. Her father lives  in [place], and her mother lives in [place], [place]. The patient  denies a family hx of ovarian cancer. A maternal great aunt was diagnosed with breast cancer twice. She has 2 brothers, no sisters   GYNECOLOGIC HISTORY:  Patient's last menstrual period was 09/09/2018. Menarche: 38 years old Age at first live birth: 38 years old [place] P 1 Currently having regular periods  Contraceptive: Mirena IUD in place HRT n/a  Hysterectomy? no BSO? no   SOCIAL HISTORY: (updated 10/05/2018)  Kaylee Romero is a [employer]. Husband Tommi Rumps is in management with [employer]. Daughter Normand Sloop is 7 months old.     ADVANCED DIRECTIVES: not in place. Her husband is automatically her HCPOA.   HEALTH MAINTENANCE: Social History   Tobacco Use   Smoking status: Former Smoker    Packs/day: 0.50    Years: 10.00    Pack years: 5.00    Types: Cigarettes    Start date: 01/13/2002    Quit date: 10/17/2016    Years since quitting: 1.9   Smokeless tobacco: Never Used  Substance Use Topics   Alcohol use: Yes    Comment: Socially   Drug use: No     Colonoscopy: n/a  PAP: 09/20/2018  Bone density: n/a   No Known Allergies  Current Outpatient Medications  Medication Sig Dispense Refill   amphetamine-dextroamphetamine (ADDERALL XR) 20 MG 24 hr capsule Take 20 mg by mouth daily.     citalopram (CELEXA) 20 MG tablet Take 20 mg by mouth daily.     hydrOXYzine (ATARAX/VISTARIL) 10 MG tablet Take 10 mg by mouth as needed.     vitamin B-12 (CYANOCOBALAMIN) 1000 MCG tablet Take 1,000 mcg by mouth daily.     ibuprofen (ADVIL,MOTRIN) 600 MG tablet Take 1 tablet (600 mg total) by mouth every 6 (six) hours. 30 tablet 0   labetalol (NORMODYNE) 200 MG tablet Take 1 tablet (200 mg total) by mouth 2 (two) times daily. (Patient not taking: Reported on 10/05/2018) 60 tablet 0   Prenatal Vit-Fe Fumarate-FA (PRENATAL MULTIVITAMIN) TABS tablet Take 1 tablet by mouth daily at 12 noon.     tetrahydrozoline 0.05 % ophthalmic solution Place 1 drop into both eyes daily.     No  current facility-administered medications for this visit.     OBJECTIVE: Young white woman in no acute distress  Vitals:   10/05/18 1317  BP: 138/65  Pulse: 86  Resp: 18  Temp: 98.7 F (37.1 C)  SpO2: 100%     Body mass index is 30.86 kg/m.   Wt Readings from Last 3 Encounters:  10/05/18 179 lb 12.8 oz (81.6 kg)  06/18/17 215 lb (97.5 kg)  01/14/13 146 lb (66.2 kg)      ECOG FS:1 - Symptomatic but completely ambulatory  Sclerae unicteric, EOMs intact Wearing a mask No cervical or supraclavicular adenopathy Lungs no rales or rhonchi Heart regular rate and rhythm Abd soft, nontender, positive bowel sounds MSK no focal spinal tenderness, no upper extremity lymphedema Neuro: nonfocal, well oriented, appropriate affect Breasts: The right breast is status post recent biopsy.  There is a minimal ecchymosis.  The left breast is benign.  Both axillae are benign.  LAB RESULTS:  CMP     Component Value Date/Time   NA 138 10/05/2018 1244   K 4.0 10/05/2018 1244   CL 101 10/05/2018 1244   CO2 26 10/05/2018 1244   GLUCOSE 97 10/05/2018 1244   BUN 12 10/05/2018 1244   CREATININE 0.68 10/05/2018 1244  CALCIUM 9.5 10/05/2018 1244   PROT 7.5 10/05/2018 1244   ALBUMIN 4.6 10/05/2018 1244   AST 15 10/05/2018 1244   ALT 18 10/05/2018 1244   ALKPHOS 54 10/05/2018 1244   BILITOT 0.5 10/05/2018 1244   GFRNONAA >60 10/05/2018 1244   GFRAA >60 10/05/2018 1244    No results found for: TOTALPROTELP, ALBUMINELP, A1GS, A2GS, BETS, BETA2SER, GAMS, MSPIKE, SPEI  No results found for: Nils Pyle, [facility]  Lab Results  Component Value Date   WBC 9.2 10/05/2018   NEUTROABS 6.3 10/05/2018   HGB 14.1 10/05/2018   HCT 40.9 10/05/2018   MCV 93.0 10/05/2018   PLT 280 10/05/2018    _0 @  No results found for: LABCA2  No components found for: [id number]  No results for input(s): INR in the last 168 hours.  No results found for: LABCA2  No results  found for: ZCH885  No results found for: OYD741  No results found for: OIN867  No results found for: CA2729  No components found for: HGQUANT  No results found for: CEA1 / No results found for: CEA1   No results found for: AFPTUMOR  No results found for: [place]  No results found for: PSA1  Appointment on 10/05/2018  Component Date Value Ref Range Status   Sodium 10/05/2018 138  135 - 145 mmol/L Final   Potassium 10/05/2018 4.0  3.5 - 5.1 mmol/L Final   Chloride 10/05/2018 101  98 - 111 mmol/L Final   CO2 10/05/2018 26  22 - 32 mmol/L Final   Glucose, Bld 10/05/2018 97  70 - 99 mg/dL Final   BUN 10/05/2018 12  6 - 20 mg/dL Final   Creatinine 10/05/2018 0.68  0.44 - 1.00 mg/dL Final   Calcium 10/05/2018 9.5  8.9 - 10.3 mg/dL Final   Total Protein 10/05/2018 7.5  6.5 - 8.1 g/dL Final   Albumin 10/05/2018 4.6  3.5 - 5.0 g/dL Final   AST 10/05/2018 15  15 - 41 U/L Final   ALT 10/05/2018 18  0 - 44 U/L Final   Alkaline Phosphatase 10/05/2018 54  38 - 126 U/L Final   Total Bilirubin 10/05/2018 0.5  0.3 - 1.2 mg/dL Final   GFR, Est Non Af Am 10/05/2018 >60  >60 mL/min Final   GFR, Est AFR Am 10/05/2018 >60  >60 mL/min Final   Anion gap 10/05/2018 11  5 - 15 Final   Performed at [facility], [place] [address], [place], [place] [address]   WBC Count 10/05/2018 9.2  4.0 - 10.5 K/uL Corrected   CORRECTED ON 09/30 AT 1517: PREVIOUSLY REPORTED AS >9.2   RBC 10/05/2018 4.40  3.87 - 5.11 MIL/uL Final   Hemoglobin 10/05/2018 14.1  12.0 - 15.0 g/dL Final   HCT 10/05/2018 40.9  36.0 - 46.0 % Final   MCV 10/05/2018 93.0  80.0 - 100.0 fL Final   MCH 10/05/2018 32.0  26.0 - 34.0 pg Final   MCHC 10/05/2018 34.5  30.0 - 36.0 g/dL Final   RDW 10/05/2018 11.9  11.5 - 15.5 % Final   Platelet Count 10/05/2018 280  150 - 400 K/uL Final   nRBC 10/05/2018 0.0  0.0 - 0.2 % Final   Neutrophils Relative % 10/05/2018 70  % Final   Neutro Abs  10/05/2018 6.3  1.7 - 7.7 K/uL Final   Lymphocytes Relative 10/05/2018 22  % Final   Lymphs Abs 10/05/2018 2.0  0.7 - 4.0 K/uL Final   Monocytes Relative  10/05/2018 6  % Final   Monocytes Absolute 10/05/2018 0.6  0.1 - 1.0 K/uL Final   Eosinophils Relative 10/05/2018 2  % Final   Eosinophils Absolute 10/05/2018 0.2  0.0 - 0.5 K/uL Final   Basophils Relative 10/05/2018 0  % Final   Basophils Absolute 10/05/2018 0.0  0.0 - 0.1 K/uL Final   Immature Granulocytes 10/05/2018 0  % Final   Abs Immature Granulocytes 10/05/2018 0.03  0.00 - 0.07 K/uL Final   Performed at [facility], [place] [address], [place], [place] [address]    (this displays the last labs from the last 3 days)  No results found for: TOTALPROTELP, ALBUMINELP, A1GS, A2GS, BETS, BETA2SER, GAMS, MSPIKE, SPEI (this displays SPEP labs)  No results found for: KPAFRELGTCHN, LAMBDASER, KAPLAMBRATIO (kappa/lambda light chains)  No results found for: HGBA, HGBA2QUANT, HGBFQUANT, HGBSQUAN (Hemoglobinopathy evaluation)   No results found for: LDH  No results found for: IRON, TIBC, IRONPCTSAT (Iron and TIBC)  No results found for: FERRITIN  Urinalysis    Component Value Date/Time   BILIRUBINUR Neg 01/18/2012 1458   PROTEINUR Neg 01/18/2012 1458   UROBILINOGEN 0.2 01/18/2012 1458   NITRITE Neg 01/18/2012 1458   LEUKOCYTESUR Negative 01/18/2012 1458     STUDIES: US Breast Ltd Uni Right Inc Axilla  Result Date: 09/23/2018 CLINICAL DATA:  Patient presents with a palpable lump in the 12 o'clock position of the right breast. She has no family history of breast carcinoma. EXAM: DIGITAL DIAGNOSTIC BILATERAL MAMMOGRAM WITH CAD AND TOMO ULTRASOUND RIGHT BREAST ULTRASOUND LEFT AXILLA COMPARISON:  None. ACR Breast Density Category c: The breast tissue is heterogeneously dense, which may obscure small masses. FINDINGS: In the right breast there is a spiculated mass with associated architectural  distortion and pleomorphic calcifications lying in the 11-12 o'clock position, middle depth, measuring 2 cm in size, corresponding to the palpable abnormality. There are no other breast masses, no other areas of architectural distortion and no other suspicious calcifications. There is a questionable abnormal lymph node in the left axilla. Mammographic images were processed with CAD. On physical exam, there is a firm but mobile mass in the 12 o'clock position of the right breast. Targeted ultrasound is performed, showing a hypoechoic irregular mass with partly ill-defined partly angular margins, as well as architectural distortion, and multiple internal echogenic foci consistent with calcifications. Mass lies at 12 o'clock, 2 cm the nipple measuring 2.1 x 1.0 x 1.9 cm. This corresponds to the palpable abnormality. Sonographic evaluation of the right axilla shows no enlarged or abnormal lymph nodes. One deep right axillary lymph node shows prominent cortex measuring between 3 and 4 mm. Sonographic evaluation of the left axilla shows no enlarged lymph nodes. Several lymph nodes show areas of cortical prominence, greatest thickness between 3 and 4 mm, all similar to the lymph node noted on the right. Given the symmetry of these lymph nodes, these are presumed reactive. IMPRESSION: 1. Highly suspicious 2.1 cm mass in the 12 o'clock position of right breast. Tissue sampling is indicated. 2. No other evidence of breast malignancy. RECOMMENDATION: 1. Ultrasound-guided core needle biopsy of the right breast mass. This has been scheduled for 7:30 a.m. on 09/26/2018. I have discussed the findings and recommendations with the patient. If applicable, a reminder letter will be sent to the patient regarding the next appointment. BI-RADS CATEGORY  5: Highly suggestive of malignancy. Electronically Signed   By: Lajean Manes M.D.   On: 09/23/2018 12:21   Mm Diag Breast  Tomo Bilateral  Result Date: 09/23/2018 CLINICAL DATA:   Patient presents with a palpable lump in the 12 o'clock position of the right breast. She has no family history of breast carcinoma. EXAM: DIGITAL DIAGNOSTIC BILATERAL MAMMOGRAM WITH CAD AND TOMO ULTRASOUND RIGHT BREAST ULTRASOUND LEFT AXILLA COMPARISON:  None. ACR Breast Density Category c: The breast tissue is heterogeneously dense, which may obscure small masses. FINDINGS: In the right breast there is a spiculated mass with associated architectural distortion and pleomorphic calcifications lying in the 11-12 o'clock position, middle depth, measuring 2 cm in size, corresponding to the palpable abnormality. There are no other breast masses, no other areas of architectural distortion and no other suspicious calcifications. There is a questionable abnormal lymph node in the left axilla. Mammographic images were processed with CAD. On physical exam, there is a firm but mobile mass in the 12 o'clock position of the right breast. Targeted ultrasound is performed, showing a hypoechoic irregular mass with partly ill-defined partly angular margins, as well as architectural distortion, and multiple internal echogenic foci consistent with calcifications. Mass lies at 12 o'clock, 2 cm the nipple measuring 2.1 x 1.0 x 1.9 cm. This corresponds to the palpable abnormality. Sonographic evaluation of the right axilla shows no enlarged or abnormal lymph nodes. One deep right axillary lymph node shows prominent cortex measuring between 3 and 4 mm. Sonographic evaluation of the left axilla shows no enlarged lymph nodes. Several lymph nodes show areas of cortical prominence, greatest thickness between 3 and 4 mm, all similar to the lymph node noted on the right. Given the symmetry of these lymph nodes, these are presumed reactive. IMPRESSION: 1. Highly suspicious 2.1 cm mass in the 12 o'clock position of right breast. Tissue sampling is indicated. 2. No other evidence of breast malignancy. RECOMMENDATION: 1. Ultrasound-guided core  needle biopsy of the right breast mass. This has been scheduled for 7:30 a.m. on 09/26/2018. I have discussed the findings and recommendations with the patient. If applicable, a reminder letter will be sent to the patient regarding the next appointment. BI-RADS CATEGORY  5: Highly suggestive of malignancy. Electronically Signed   By: Lajean Manes M.D.   On: 09/23/2018 12:21   [place] Axilla Left  Result Date: 09/23/2018 CLINICAL DATA:  Patient presents with a palpable lump in the 12 o'clock position of the right breast. She has no family history of breast carcinoma. EXAM: DIGITAL DIAGNOSTIC BILATERAL MAMMOGRAM WITH CAD AND TOMO ULTRASOUND RIGHT BREAST ULTRASOUND LEFT AXILLA COMPARISON:  None. ACR Breast Density Category c: The breast tissue is heterogeneously dense, which may obscure small masses. FINDINGS: In the right breast there is a spiculated mass with associated architectural distortion and pleomorphic calcifications lying in the 11-12 o'clock position, middle depth, measuring 2 cm in size, corresponding to the palpable abnormality. There are no other breast masses, no other areas of architectural distortion and no other suspicious calcifications. There is a questionable abnormal lymph node in the left axilla. Mammographic images were processed with CAD. On physical exam, there is a firm but mobile mass in the 12 o'clock position of the right breast. Targeted ultrasound is performed, showing a hypoechoic irregular mass with partly ill-defined partly angular margins, as well as architectural distortion, and multiple internal echogenic foci consistent with calcifications. Mass lies at 12 o'clock, 2 cm the nipple measuring 2.1 x 1.0 x 1.9 cm. This corresponds to the palpable abnormality. Sonographic evaluation of the right axilla shows no enlarged or abnormal lymph nodes. One deep right axillary lymph node  shows prominent cortex measuring between 3 and 4 mm. Sonographic evaluation of the left axilla shows no  enlarged lymph nodes. Several lymph nodes show areas of cortical prominence, greatest thickness between 3 and 4 mm, all similar to the lymph node noted on the right. Given the symmetry of these lymph nodes, these are presumed reactive. IMPRESSION: 1. Highly suspicious 2.1 cm mass in the 12 o'clock position of right breast. Tissue sampling is indicated. 2. No other evidence of breast malignancy. RECOMMENDATION: 1. Ultrasound-guided core needle biopsy of the right breast mass. This has been scheduled for 7:30 a.m. on 09/26/2018. I have discussed the findings and recommendations with the patient. If applicable, a reminder letter will be sent to the patient regarding the next appointment. BI-RADS CATEGORY  5: Highly suggestive of malignancy. Electronically Signed   By: Lajean Manes M.D.   On: 09/23/2018 12:21   Mm Clip Placement Right  Result Date: 09/26/2018 CLINICAL DATA:  Evaluate biopsy marker EXAM: DIAGNOSTIC RIGHT MAMMOGRAM POST ULTRASOUND BIOPSY COMPARISON:  Previous exam(s). FINDINGS: Mammographic images were obtained following ultrasound guided biopsy of a right breast mass. The ribbon shaped clip is within the biopsied mass. IMPRESSION: The ribbon shaped clip is within the biopsied mass. Final Assessment: Post Procedure Mammograms for Marker Placement Electronically Signed   By: Dorise Bullion III M.D   On: 09/26/2018 08:38   [place] Rt Breast Bx W Loc Dev 1st Lesion Img Bx Spec US Guide  Addendum Date: 09/27/2018   ADDENDUM REPORT: 09/27/2018 12:38 ADDENDUM: Pathology revealed GRADE II INVASIVE DUCTAL CARCINOMA, DUCTAL CARCINOMA IN SITU of the RIGHT breast, 12 o'clock. This was found to be concordant by Dr. Dorise Bullion. Pathology results were discussed with the patient by telephone. The patient reported doing well after the biopsy with tenderness at the site. Post biopsy instructions and care were reviewed and questions were answered. The patient was encouraged to call [facility] for any additional concerns. The patient was referred to [facility] on October 05, 2018. Pathology results reported by Stacie Acres, RN on 09/27/2018. Electronically Signed   By: Dorise Bullion III M.D   On: 09/27/2018 12:38   Result Date: 09/27/2018 CLINICAL DATA:  Biopsy of a right breast mass EXAM: ULTRASOUND GUIDED RIGHT BREAST CORE NEEDLE BIOPSY COMPARISON:  Previous exam(s). FINDINGS: I met with the patient and we discussed the procedure of ultrasound-guided biopsy, including benefits and alternatives. We discussed the high likelihood of a successful procedure. We discussed the risks of the procedure, including infection, bleeding, tissue injury, clip migration, and inadequate sampling. Informed written consent was given. The usual time-out protocol was performed immediately prior to the procedure. Lesion quadrant: 12 o'clock Using sterile technique and 1% Lidocaine as local anesthetic, under direct ultrasound visualization, a 12 gauge spring-loaded device was used to perform biopsy of the 12 o'clock right breast mass using a lateral approach. At the conclusion of the procedure a ribbon shaped tissue marker clip was deployed into the biopsy cavity. Follow up 2 view mammogram was performed and dictated separately. IMPRESSION: Ultrasound guided biopsy of the 12 o'clock right breast mass. No apparent complications. Electronically Signed: By: Dorise Bullion III M.D On: 09/26/2018 08:39    ELIGIBLE FOR AVAILABLE RESEARCH PROTOCOL: no  ASSESSMENT: 38 y.o. [place] woman status post right breast upper inner quadrant biopsy on 09/26/2018 for a clinical T2 N0, stage IB invasive ductal carcinoma, grade 2, estrogen and progesterone receptor  positive, HER-2 nonamplified, with an MIB-1-1 of 35%  (1) genetics testing pending  (2) MammaPrint being obtained from the original breast biopsy  (3) definitive surgery to  follow  (4) if the patient will need chemotherapy will use cyclophosphamide and doxorubicin in dose dense fashion x4 followed by weekly paclitaxel x12  (5) adjuvant radiation as appropriate  (6) tamoxifen started 10/05/2018 in anticipation of surgical delays  PLAN: I spent approximately 60 minutes face to face with Kaylee Romero with more than 50% of that time spent in counseling and coordination of care. Specifically we reviewed the biology of the patient's diagnosis and the specifics of her situation.  We first reviewed the fact that cancer is not one disease but more than 100 different diseases and that it is important to keep them separate-- otherwise when friends and relatives discuss their own cancer experiences with Kaylee Romero confusion can result. Similarly we explained that if breast cancer spreads to the bone or liver, the patient would not have bone cancer or liver cancer, but breast cancer in the bone and breast cancer in the liver: one cancer in three places-- not 3 different cancers which otherwise would have to be treated in 3 different ways.  We discussed the difference between local and systemic therapy. In terms of loco-regional treatment, lumpectomy plus radiation is equivalent to mastectomy as far as survival is concerned. For this reason, and because the cosmetic results are generally superior, we recommend breast conserving surgery.  She is considering breast reduction which could be done at the same time.  She is still however has not decided whether or not she wants to proceed to mastectomies.    We also noted that in terms of sequencing of treatments, whether systemic therapy or surgery is done first does not affect the ultimate outcome.  Given the possible delay in surgery we are going to be starting tamoxifen neoadjuvantly as discussed below  We then discussed the rationale for systemic therapy. There is some risk that this cancer may have already spread to other parts of her body.  Patients frequently ask at this point about bone scans, CAT scans and PET scans to find out if they have occult breast cancer somewhere else. The problem is that in early stage disease we are much more likely to find false positives then true cancers and this would expose the patient to unnecessary procedures as well as unnecessary radiation. Scans cannot answer the question the patient really would like to know, which is whether she has microscopic disease elsewhere in her body. For those reasons we do not recommend them.  Of course we would proceed to aggressive evaluation of any symptoms that might suggest metastatic disease, but that is not the case here.  Next we went over the options for systemic therapy which are anti-estrogens, anti-HER-2 immunotherapy, and chemotherapy. Kaylee Romero does not meet criteria for anti-HER-2 immunotherapy. She is a good candidate for anti-estrogens.  The question of chemotherapy is more complicated. Chemotherapy is most effective in rapidly growing, aggressive tumors. It is much less effective in low-grade, slow growing cancers.  Caris tumor is intermediate. For that reason we are going to request a MammaPrint study on the original biopsy. That will help [place] make a definitive decision regarding chemotherapy in this case.  Kaylee Romero is interested in the possibility of breast reduction at the same time as lumpectomy or possibly in bilateral mastectomies with reconstruction.  She will meet with plastic surgeon to discuss this further.  Because this is going to  delay the definitive surgery, we discussed starting tamoxifen now.  She has a good understanding of the possible toxicities, side effects and complications of this agent, and I have gone ahead and placed the order in for her.  This will allow her to take however much time she needs to make a decision that she is comfortable with for the long-term  If she does decide an extensive surgery, it might make sense to treat with  chemotherapy (assuming her MammaPrint is high risk) before surgery, which will make the surgery easier but also will avoid delays in starting systemic therapy in case she developed surgical complications.  Kaylee Romero has a good understanding of the overall plan. She agrees with it. She knows the goal of treatment in her case is cure. She will call with any problems that may develop before her next visit here.   Chauncey Cruel, MD   10/05/2018 3:53 PM Medical Oncology and Hematology [facility] [place], [place] [address] Tel. [phone]    Fax. [phone]   This document serves as a record of services personally performed by Lurline Del, MD. It was created on his behalf by Wilburn Mylar, a trained medical scribe. The creation of this record is based on the scribe's personal observations and the provider's statements to them.   I, Lurline Del MD, have reviewed the above documentation for accuracy and completeness, and I agree with the above.

## 2018-10-05 ENCOUNTER — Encounter: Payer: Self-pay | Admitting: Oncology

## 2018-10-05 ENCOUNTER — Ambulatory Visit: Payer: BC Managed Care – PPO | Attending: General Surgery | Admitting: Physical Therapy

## 2018-10-05 ENCOUNTER — Encounter: Payer: Self-pay | Admitting: Physical Therapy

## 2018-10-05 ENCOUNTER — Other Ambulatory Visit: Payer: Self-pay

## 2018-10-05 ENCOUNTER — Telehealth: Payer: Self-pay | Admitting: *Deleted

## 2018-10-05 ENCOUNTER — Inpatient Hospital Stay: Payer: BC Managed Care – PPO

## 2018-10-05 ENCOUNTER — Ambulatory Visit (HOSPITAL_BASED_OUTPATIENT_CLINIC_OR_DEPARTMENT_OTHER): Payer: BC Managed Care – PPO | Admitting: Licensed Clinical Social Worker

## 2018-10-05 ENCOUNTER — Encounter: Payer: Self-pay | Admitting: Licensed Clinical Social Worker

## 2018-10-05 ENCOUNTER — Other Ambulatory Visit: Payer: Self-pay | Admitting: *Deleted

## 2018-10-05 ENCOUNTER — Ambulatory Visit
Admission: RE | Admit: 2018-10-05 | Discharge: 2018-10-05 | Disposition: A | Discharge status: Home / Self Care | Payer: BC Managed Care – PPO | Source: Ambulatory Visit | Attending: Radiation Oncology | Admitting: Radiation Oncology

## 2018-10-05 ENCOUNTER — Inpatient Hospital Stay: Payer: BC Managed Care – PPO | Attending: Oncology | Admitting: Oncology

## 2018-10-05 VITALS — BP 138/65 | HR 86 | Temp 98.7°F | Resp 18 | Ht 64.0 in | Wt 179.8 lb

## 2018-10-05 DIAGNOSIS — Z170 Estrogen receptor positive status [ER+]: Secondary | ICD-10-CM | POA: Insufficient documentation

## 2018-10-05 DIAGNOSIS — Z803 Family history of malignant neoplasm of breast: Secondary | ICD-10-CM

## 2018-10-05 DIAGNOSIS — C50411 Malignant neoplasm of upper-outer quadrant of right female breast: Secondary | ICD-10-CM

## 2018-10-05 DIAGNOSIS — C50811 Malignant neoplasm of overlapping sites of right female breast: Secondary | ICD-10-CM | POA: Insufficient documentation

## 2018-10-05 DIAGNOSIS — R293 Abnormal posture: Secondary | ICD-10-CM | POA: Diagnosis present

## 2018-10-05 LAB — CMP (CANCER CENTER ONLY)
ALT: 18 U/L (ref 0–44)
AST: 15 U/L (ref 15–41)
Albumin: 4.6 g/dL (ref 3.5–5.0)
Alkaline Phosphatase: 54 U/L (ref 38–126)
Anion gap: 11 (ref 5–15)
BUN: 12 mg/dL (ref 6–20)
CO2: 26 mmol/L (ref 22–32)
Calcium: 9.5 mg/dL (ref 8.9–10.3)
Chloride: 101 mmol/L (ref 98–111)
Creatinine: 0.68 mg/dL (ref 0.44–1.00)
GFR, Est AFR Am: >60 mL/min (ref >60)
GFR, Estimated: >60 mL/min (ref >60)
Glucose, Bld: 97 mg/dL (ref 70–99)
Potassium: 4 mmol/L (ref 3.5–5.1)
Sodium: 138 mmol/L (ref 135–145)
Total Bilirubin: 0.5 mg/dL (ref 0.3–1.2)
Total Protein: 7.5 g/dL (ref 6.5–8.1)

## 2018-10-05 LAB — CBC WITH DIFFERENTIAL (CANCER CENTER ONLY)
Abs Immature Granulocytes: 0.03 10*3/uL (ref 0.00–0.07)
Basophils Absolute: 0 10*3/uL (ref 0.0–0.1)
Basophils Relative: 0 %
Eosinophils Absolute: 0.2 10*3/uL (ref 0.0–0.5)
Eosinophils Relative: 2 %
HCT: 40.9 % (ref 36.0–46.0)
Hemoglobin: 14.1 g/dL (ref 12.0–15.0)
Immature Granulocytes: 0 %
Lymphocytes Relative: 22 %
Lymphs Abs: 2 10*3/uL (ref 0.7–4.0)
MCH: 32 pg (ref 26.0–34.0)
MCHC: 34.5 g/dL (ref 30.0–36.0)
MCV: 93 fL (ref 80.0–100.0)
Monocytes Absolute: 0.6 10*3/uL (ref 0.1–1.0)
Monocytes Relative: 6 %
Neutro Abs: 6.3 10*3/uL (ref 1.7–7.7)
Neutrophils Relative %: 70 %
Platelet Count: 280 10*3/uL (ref 150–400)
RBC: 4.4 MIL/uL (ref 3.87–5.11)
RDW: 11.9 % (ref 11.5–15.5)
WBC Count: 9.2 10*3/uL (ref 4.0–10.5)
nRBC: 0 % (ref 0.0–0.2)

## 2018-10-05 MED ORDER — TAMOXIFEN CITRATE 20 MG PO TABS: 20.0000 mg | ORAL_TABLET | Freq: Every day | ORAL | 12 refills | Status: AC

## 2018-10-05 NOTE — Patient Instructions (Signed)

## 2018-10-05 NOTE — Therapy (Signed)
[place], [place], [address] Phone: [phone]   Fax:  [phone]  Physical Therapy Evaluation  Patient Details  Name: Kaylee Romero MRN: [id number] Date of Birth: 10-03-1980 Referring Provider (PT): Dr. Autumn Messing   Encounter Date: 10/05/2018  PT End of Session - 10/05/18 1338    Visit Number  1    Number of Visits  2    Date for PT Re-Evaluation  11/30/18    PT Start Time  1506    PT Stop Time  1540    PT Time Calculation (min)  34 min    Activity Tolerance  Patient tolerated treatment well    Behavior During Therapy  [facility] for tasks assessed/performed       Past Medical History:  Diagnosis Date  . Anxiety   . HSV (herpes simplex virus) anogenital infection    positive titer only    Past Surgical History:  Procedure Laterality Date  . TONSILLECTOMY  1987  . TONSILLECTOMY    . WISDOM TOOTH EXTRACTION      There were no vitals filed for this visit.   Subjective Assessment - 10/05/18 1322    Subjective  Patient reports she is here today to be seen by her medical team for her newly diagnosed right breast cancer.    Pertinent History  Patient was diagnosed on 09/23/2018 with right invasive ductal carcinoma breast cancer. It measures 2.1 cm and is located in the upper outer quadrant. It is ER/PR positive and HER2 negative with a Ki67 of 35%.    Patient Stated Goals  Learn post op shoulder ROM HEP and lymphedema risk reduction    Currently in Pain?  No/denies         [facility] PT Assessment - 10/05/18 0001      Assessment   Medical Diagnosis  Right breast cancer    Referring Provider (PT)  Dr. Autumn Messing    Onset Date/Surgical Date  09/23/18    Hand Dominance  Right    Prior Therapy  none      Precautions   Precautions  Other (comment)    Precaution Comments  active cancer      Restrictions   Weight Bearing Restrictions  No      Balance Screen   Has the patient fallen in the past 6  months  No    Has the patient had a decrease in activity level because of a fear of falling?   No    Is the patient reluctant to leave their home because of a fear of falling?   No      Home Environment   Living Environment  Private residence    Living Arrangements  Spouse/significant other;Children   Husband and 31 month old   Available Help at Discharge  Family      Prior Function   Level of Independence  Independent    Vocation  Full time employment    Equities trader    Leisure  She does not exercise      Cognition   Overall Cognitive Status  Within Functional Limits for tasks assessed      Posture/Postural Control   Posture/Postural Control  Postural limitations    Postural Limitations  Rounded Shoulders;Forward head      ROM / Strength   AROM / PROM / Strength  AROM;Strength      AROM   AROM Assessment Site  Shoulder;Cervical    Right/Left Shoulder  Right;Left    Right Shoulder Extension  44 Degrees    Right Shoulder Flexion  155 Degrees    Right Shoulder ABduction  169 Degrees    Right Shoulder Internal Rotation  65 Degrees    Right Shoulder External Rotation  80 Degrees    Left Shoulder Extension  42 Degrees    Left Shoulder Flexion  155 Degrees    Left Shoulder ABduction  176 Degrees    Left Shoulder Internal Rotation  62 Degrees    Left Shoulder External Rotation  90 Degrees    Cervical Flexion  WNL    Cervical Extension  WNL    Cervical - Right Side Bend  WNL    Cervical - Left Side Bend  WNL    Cervical - Right Rotation  WNL    Cervical - Left Rotation  WNL      Strength   Overall Strength  Within functional limits for tasks performed        LYMPHEDEMA/ONCOLOGY QUESTIONNAIRE - 10/05/18 1337      Type   Cancer Type  Right breast cancer      Lymphedema Assessments   Lymphedema Assessments  Upper extremities      Right Upper Extremity Lymphedema   10 cm Proximal to Olecranon Process  30.1 cm    Olecranon Process  25.4 cm    10 cm  Proximal to Ulnar Styloid Process  22.6 cm    Just Proximal to Ulnar Styloid Process  16.6 cm    Across Hand at [employer]  17.7 cm    At [place] of 2nd Digit  6.1 cm      Left Upper Extremity Lymphedema   10 cm Proximal to Olecranon Process  31.5 cm    Olecranon Process  25.3 cm    10 cm Proximal to Ulnar Styloid Process  22.8 cm    Just Proximal to Ulnar Styloid Process  16.6 cm    Across Hand at [employer]  18.1 cm    At [place] of 2nd Digit  5.8 cm          Quick Dash - 10/05/18 0001    Open a tight or new jar  No difficulty    Do heavy household chores (wash walls, wash floors)  No difficulty    Carry a shopping bag or briefcase  No difficulty    Wash your back  No difficulty    Use a knife to cut food  No difficulty    Recreational activities in which you take some force or impact through your arm, shoulder, or hand (golf, hammering, tennis)  No difficulty    During the past week, to what extent has your arm, shoulder or hand problem interfered with your normal social activities with family, friends, neighbors, or groups?  Not at all    During the past week, to what extent has your arm, shoulder or hand problem limited your work or other regular daily activities  Not at all    Arm, shoulder, or hand pain.  None    Tingling (pins and needles) in your arm, shoulder, or hand  None    Difficulty Sleeping  No difficulty    DASH Score  0 %        Objective measurements completed on examination: See above findings.       Patient was instructed today in a home exercise program today for post op shoulder range of motion. These included active assist  shoulder flexion in sitting, scapular retraction, wall walking with shoulder abduction, and hands behind head external rotation.  She was encouraged to do these twice a day, holding 3 seconds and repeating 5 times when permitted by her physician.           PT Education - 10/05/18 1337    Education Details  Lymphedema  risk reduction and post op shoulder ROM HEP    Person(s) Educated  Patient    Methods  Explanation;Demonstration;Handout    Comprehension  Returned demonstration;Verbalized understanding          PT Long Term Goals - 10/05/18 1341      PT LONG TERM GOAL #1   Title  Patient will demonstrate she has regained full shoulder ROM and function post operatively compared to baselines.    Time  [facility] Goals - 10/05/18 1341      Patient will be able to verbalize understanding of pertinent lymphedema risk reduction practices relevant to her diagnosis specifically related to skin care.   Time  1    Period  Days    Status  Achieved      Patient will be able to return demonstrate and/or verbalize understanding of the post-op home exercise program related to regaining shoulder range of motion.   Time  1    Period  Days    Status  Achieved      Patient will be able to verbalize understanding of the importance of attending the postoperative After Breast Cancer Class for further lymphedema risk reduction education and therapeutic exercise.   Time  1    Period  Days    Status  Achieved            Plan - 10/05/18 1338    Clinical Impression Statement  Patient was diagnosed on 09/23/2018 with right invasive ductal carcinoma breast cancer. It measures 2.1 cm and is located in the upper outer quadrant. It is ER/PR positive and HER2 negative with a Ki67 of 35%. Her multidisciplinary medical team met prior to her assessments to determine a recommended treatment plan. She is planning to have a Mammaprint test, a right lumpectomy and sentinel node biopsy, radiation, and anti-estrogen therapy. She will benefit from a post op PT reassessment to determine needs.    Stability/Clinical Decision Making  Stable/Uncomplicated    Clinical Decision Making  Low    Rehab Potential  Excellent    PT Frequency  --   Eval and 1 f/u visit   PT Treatment/Interventions   ADLs/Self Care Home Management;Therapeutic exercise;Patient/family education    PT Next Visit Plan  Will reassess 3-4 weeks post op to determine needs    PT Home Exercise Plan  Post op shoulder ROM HEP    Consulted and Agree with Plan of Care  Patient       Patient will benefit from skilled therapeutic intervention in order to improve the following deficits and impairments:  Postural dysfunction, Decreased range of motion, Pain, Impaired UE functional use, Decreased knowledge of precautions  Visit Diagnosis: Malignant neoplasm of upper-outer quadrant of right breast in female, estrogen receptor positive ([place]) - Plan: PT plan of care cert/re-cert  Abnormal posture - Plan: PT plan of care cert/re-cert   Patient will follow up at outpatient cancer rehab 3-4 weeks following surgery.  If the patient requires physical therapy at that time, a specific plan will be  dictated and sent to the referring physician for approval. The patient was educated today on appropriate basic range of motion exercises to begin post operatively and the importance of attending the After Breast Cancer class following surgery.  Patient was educated today on lymphedema risk reduction practices as it pertains to recommendations that will benefit the patient immediately following surgery.  She verbalized good understanding.     Problem List Patient Active Problem List   Diagnosis Date Noted  . Malignant neoplasm of upper-outer quadrant of right breast in female, estrogen receptor positive ([place]) 09/29/2018  . Gestational hypertension 06/18/2017  . Pregnancy 06/18/2017  . Low back pain 01/14/2013  . ADD (attention deficit disorder) 03/24/2012  . Overweight (BMI 25.0-29.9) 01/14/2012   Annia Friendly, PT 10/05/18 3:47 PM  [place], [place], [address] Phone: [phone]   Fax:  [phone]  Name: Kaylee Romero MRN: [id number] Date  of Birth: January 26, 1980

## 2018-10-05 NOTE — Progress Notes (Addendum)
REFERRING PROVIDER: Chauncey Cruel, MD [place],  [place] [address]  PRIMARY PROVIDER:  Linda Hedges, DO  PRIMARY REASON FOR VISIT:  1. Malignant neoplasm of upper-outer quadrant of right breast in female, estrogen receptor positive ([place])   2. Family history of breast cancer     I connected with Kaylee Romero on 10/05/2018 at 3:45 PM EDT by Jackquline Denmark and verified that I am speaking with the correct person using two identifiers.    Patient location: [facility] Provider location: office   HISTORY OF PRESENT ILLNESS:   Kaylee Romero, a 38 y.o. female, was seen for a [place] cancer genetics consultation at the request of Dr. Jana Hakim due to her recent diagnosis of breast cancer.  Kaylee Romero presents to clinic today to discuss the possibility of a hereditary predisposition to cancer, genetic testing, and to further clarify her future cancer risks, as well as potential cancer risks for family members.   In 2020, at the age of 47, Kaylee Romero was diagnosed with IDC of the right breast, ER/PR+, Her2-. The treatment plan includes surgery, possible chemotherapy and possible radiation. She notes that genetic testing will help with surgical decision making.    CANCER HISTORY:  Oncology History   No history exists.     RISK FACTORS:  Menarche was at age 1.  First live birth at age 65.  Ovaries intact: yes.  Hysterectomy: no.  Menopausal status: premenopausal.  HRT use: 0 years.   Past Medical History:  Diagnosis Date  . Anxiety   . Family history of breast cancer   . HSV (herpes simplex virus) anogenital infection    positive titer only    Past Surgical History:  Procedure Laterality Date  . TONSILLECTOMY  1987  . TONSILLECTOMY    . WISDOM TOOTH EXTRACTION      Social History   Socioeconomic History  . Marital status: Married    Spouse name: Not on file  . Number of children: Not on file  . Years of education: Not on file  . Highest education level:  Not on file  Occupational History  . Not on file  Social Needs  . Financial resource strain: Not on file  . Food insecurity    Worry: Not on file    Inability: Not on file  . Transportation needs    Medical: Not on file    Non-medical: Not on file  Tobacco Use  . Smoking status: Former Smoker    Packs/day: 0.50    Years: 10.00    Pack years: 5.00    Types: Cigarettes    Start date: 01/13/2002    Quit date: 10/17/2016    Years since quitting: 1.9  . Smokeless tobacco: Never Used  Substance and Sexual Activity  . Alcohol use: Yes    Comment: Socially  . Drug use: No  . Sexual activity: Not on file  Lifestyle  . Physical activity    Days per week: Not on file    Minutes per session: Not on file  . Stress: Not on file  Relationships  . Social Herbalist on phone: Not on file    Gets together: Not on file    Attends religious service: Not on file    Active member of club or organization: Not on file    Attends meetings of clubs or organizations: Not on file    Relationship status: Not on file  Other Topics Concern  . Not on file  Social History Narrative  . Not on file     FAMILY HISTORY:  We obtained a detailed, 4-generation family history.  Significant diagnoses are listed below: Family History  Problem Relation Age of Onset  . Hypertension Father   . Alcohol abuse Paternal Grandfather   . Heart disease Paternal Grandfather   . Alcohol abuse Maternal Grandfather   . Heart disease Maternal Grandmother   . Breast cancer Other    Kaylee Romero has one daughter, 67 months old. She has one paternal half brother, 76, and one maternal half brother, 29, neither have had cancer and neither have children.  Kaylee Romero mother is living at 23. Patient has 1 maternal uncle, no cancers. No cancer in her maternal cousin. Her maternal grandmother died at 79. This grandmother had 6 sisters, one had breast cancer twice and is still living. She does not have information  about maternal grandfather.  Kaylee Romero father is living at 72. Patient has 1 paternal aunt, no cancer. No cancers in paternal cousins. Her paternal grandmother died at 66, grandfather died at 39, no cancer diagnoses.   Kaylee Romero is unaware of previous family history of genetic testing for hereditary cancer risks. Patient's maternal ancestors are of [place] descent, and paternal ancestors are of [place] descent. There is no reported Ashkenazi Jewish ancestry. There is no known consanguinity.  GENETIC COUNSELING ASSESSMENT: Kaylee Romero is a 38 y.o. female with a personal history which is somewhat suggestive of a hereditary cancer syndrome and predisposition to cancer. We, therefore, discussed and recommended the following at today's visit.   DISCUSSION: We discussed that 5 - 10% of breast cancer is hereditary, with most cases associated with BRCA1/BRCA2 mutations.  There are other genes that can be associated with hereditary breast cancer syndromes. We discussed that testing is beneficial for several reasons including surgical decision-making for breast cancer, knowing how to follow individuals after completing their treatment, and understand if other family members could be at risk for cancer and allow them to undergo genetic testing.   We reviewed the characteristics, features and inheritance patterns of hereditary cancer syndromes. We also discussed genetic testing, including the appropriate family members to test, the process of testing, insurance coverage and turn-around-time for results. We discussed the implications of a negative, positive and/or variant of uncertain significant result. In order to get genetic test results in a timely manner so that Kaylee Romero can use these genetic test results for surgical decisions, we recommended Kaylee Romero pursue genetic testing for the [facility] Breast Cancer STAT Panel. Once complete, we recommend Kaylee Romero pursue reflex genetic testing to the  Common Hereditary Cancers gene panel.   The STAT Breast cancer panel offered by [facility] includes sequencing and rearrangement analysis for the following 9 genes:  ATM, BRCA1, BRCA2, CDH1, CHEK2, PALB2, PTEN, STK11 and TP53.    The Common Hereditary Cancers Panel offered by [facility] includes sequencing and/or deletion duplication testing of the following 47 genes: APC, ATM, AXIN2, BARD1, BMPR1A, BRCA1, BRCA2, BRIP1, CDH1, CDKN2A (p14ARF), CDKN2A (p16INK4a), CKD4, CHEK2, CTNNA1, DICER1, EPCAM (Deletion/duplication testing only), GREM1 (promoter region deletion/duplication testing only), KIT, MEN1, MLH1, MSH2, MSH3, MSH6, MUTYH, NBN, NF1, NHTL1, PALB2, PDGFRA, PMS2, POLD1, POLE, PTEN, RAD50, RAD51C, RAD51D, SDHB, SDHC, SDHD, SMAD4, SMARCA4. STK11, TP53, TSC1, TSC2, and VHL.  The following genes were evaluated for sequence changes only: SDHA and HOXB13 c.251G>A variant only.  Based on Kaylee Romero's personal and family history of cancer, she meets medical criteria for genetic testing. Despite that  she meets criteria, she may still have an out of pocket cost.   PLAN: After considering the risks, benefits, and limitations, Kaylee Romero provided informed consent to pursue genetic testing and the blood sample was sent to [facility] for analysis of the Breast Cancer STAT Panel + Common Hereditary Cancers Panel. Results should be available within approximately 5-12 days' time, at which point they will be disclosed by telephone to Kaylee Romero, as will any additional recommendations warranted by these results. Kaylee Romero will receive a summary of her genetic counseling visit and a copy of her results once available. This information will also be available in Epic.   Kaylee Romero questions were answered to her satisfaction today. Our contact information was provided should additional questions or concerns arise. Thank you for the referral and allowing [place] to share in the care of your patient.   Faith Rogue, MS, [facility] Genetic Counselor [email or web address] Phone: [phone]  The patient was seen for a total of 15 minutes in face-to-face genetic counseling.  Drs. Magrinat, Lindi Adie and/or Burr Medico were available for discussion regarding this case.   _______________________________________________________________________ For Office Staff:  Number of people involved in session: 1 Was an Intern/ student involved with case: no

## 2018-10-05 NOTE — Progress Notes (Signed)
Radiation Oncology         [phone]) [phone] ________________________________  Multidisciplinary Breast Oncology Clinic [facility]) Initial Outpatient Consultation  Name: Kaylee Romero MRN: [id number]  Date: 10/05/2018  DOB: [birth year]  CC:Morris, [place], DO  Morris, [place], DO   REFERRING PHYSICIAN: Linda Hedges, DO  DIAGNOSIS: The encounter diagnosis was Malignant neoplasm of upper-outer quadrant of right breast in female, estrogen receptor positive ([place]).  Stage T2, Nx Right Breast UOQ, Invasive Ductal Carcinoma with DCIS, ER+ / PR+ / Her2-, Grade 2    ICD-10-CM   1. Malignant neoplasm of upper-outer quadrant of right breast in female, estrogen receptor positive ([place])  C50.411    Z17.0     HISTORY OF PRESENT ILLNESS::Kaylee Romero is a 38 y.o. female who is presenting to the office today for evaluation of her newly diagnosed breast cancer. She is accompanied by no one due to COVID restrictions. She is doing well overall.   She presented with a palpable lump in the 12 o'clock position of the right breast. She underwent bilateral diagnostic mammography and ultrasonography on 09/23/2018 showing: highly suspicious 2.1 cm mass in the 12 o'clock position of the right breast; no other evidence of breast malignancy or lymphadenopathy bilaterally.  Biopsy on 09/26/2018 showed: invasive ductal carcinoma, grade 2; ductal carcinoma in situ. Prognostic indicators significant for: estrogen receptor, 80% positive with moderate staining intensity and progesterone receptor, 90% positive with strong staining intensity. Proliferation marker Ki67 at 35%. HER2 negative.  Menarche: 38 years old Age at first live birth: 38 years old GP: 1 LMP: 09/12/2018 Contraceptive: no HRT: n/a   The patient was referred today for presentation in the multidisciplinary conference.  Radiology studies and pathology slides were presented there for review and discussion of treatment options.  A consensus was discussed regarding  potential next steps.  PREVIOUS RADIATION THERAPY: No  PAST MEDICAL HISTORY:  Past Medical History:  Diagnosis Date   Anxiety    Family history of breast cancer    HSV (herpes simplex virus) anogenital infection    positive titer only    PAST SURGICAL HISTORY: Past Surgical History:  Procedure Laterality Date   TONSILLECTOMY  54   TONSILLECTOMY     WISDOM TOOTH EXTRACTION      FAMILY HISTORY:  Family History  Problem Relation Age of Onset   Hypertension Father    Alcohol abuse Paternal Grandfather    Heart disease Paternal Grandfather    Alcohol abuse Maternal Grandfather    Heart disease Maternal Grandmother    Breast cancer Other     SOCIAL HISTORY:  Social History   Socioeconomic History   Marital status: Married    Spouse name: Not on file   Number of children: Not on file   Years of education: Not on file   Highest education level: Not on file  Occupational History   Not on file  Social Needs   Financial resource strain: Not on file   Food insecurity    Worry: Not on file    Inability: Not on file   Transportation needs    Medical: Not on file    Non-medical: Not on file  Tobacco Use   Smoking status: Former Smoker    Packs/day: 0.50    Years: 10.00    Pack years: 5.00    Types: Cigarettes    Start date: 01/13/2002    Quit date: 10/17/2016    Years since quitting: 1.9   Smokeless tobacco: Never Used  Substance and Sexual Activity  Alcohol use: Yes    Comment: Socially   Drug use: No   Sexual activity: Not on file  Lifestyle   Physical activity    Days per week: Not on file    Minutes per session: Not on file   Stress: Not on file  Relationships   Social connections    Talks on phone: Not on file    Gets together: Not on file    Attends religious service: Not on file    Active member of club or organization: Not on file    Attends meetings of clubs or organizations: Not on file    Relationship status: Not  on file  Other Topics Concern   Not on file  Social History Narrative   Not on file    ALLERGIES: No Known Allergies  MEDICATIONS:  Current Outpatient Medications  Medication Sig Dispense Refill   amphetamine-dextroamphetamine (ADDERALL XR) 20 MG 24 hr capsule Take 20 mg by mouth daily.     citalopram (CELEXA) 20 MG tablet Take 20 mg by mouth daily.     hydrOXYzine (ATARAX/VISTARIL) 10 MG tablet Take 10 mg by mouth as needed.     ibuprofen (ADVIL,MOTRIN) 600 MG tablet Take 1 tablet (600 mg total) by mouth every 6 (six) hours. 30 tablet 0   labetalol (NORMODYNE) 200 MG tablet Take 1 tablet (200 mg total) by mouth 2 (two) times daily. (Patient not taking: Reported on 10/05/2018) 60 tablet 0   Prenatal Vit-Fe Fumarate-FA (PRENATAL MULTIVITAMIN) TABS tablet Take 1 tablet by mouth daily at 12 noon.     tamoxifen (NOLVADEX) 20 MG tablet Take 1 tablet (20 mg total) by mouth daily. 90 tablet 12   tetrahydrozoline 0.05 % ophthalmic solution Place 1 drop into both eyes daily.     vitamin B-12 (CYANOCOBALAMIN) 1000 MCG tablet Take 1,000 mcg by mouth daily.     No current facility-administered medications for this encounter.     REVIEW OF SYSTEMS: A 10+ POINT REVIEW OF SYSTEMS WAS OBTAINED including neurology, dermatology, psychiatry, cardiac, respiratory, lymph, extremities, GI, GU, musculoskeletal, constitutional, reproductive, HEENT. On the provided form, she reports night sweats, fatigue that sometimes affects her activities, and palpable breast lump. She denies shortness of breath, cough, bowel or bladder disturbances, headache, pain, and any other symptoms.    PHYSICAL EXAM:  Vitals with BMI 10/05/2018  Height '5\' 4"'$   Weight 179 lbs 13 oz  BMI 42.39  Systolic 532  Diastolic 65  Pulse 86  Lungs are clear to auscultation bilaterally. Heart has regular rate and rhythm. No palpable cervical, supraclavicular, or axillary adenopathy. Abdomen soft, non-tender, normal bowel  sounds. Breast: left breast with no palpable mass, nipple discharge, or bleeding.  Right breast with palpable mass in the 12 o'clock position, approximately 1-2 cm from the areolar boarder, estimated to be approximately 2 cm in size. She has biopsy sites in the lateral aspects of the breast with some associated bruising in the upper-outer quadrant. No nipple discharge or bleeding.   ECOG = 1  0 - Asymptomatic (Fully active, able to carry on all predisease activities without restriction)  1 - Symptomatic but completely ambulatory (Restricted in physically strenuous activity but ambulatory and able to carry out work of a light or sedentary nature. For example, light housework, office work)  2 - Symptomatic, <50% in bed during the day (Ambulatory and capable of all self care but unable to carry out any work activities. Up and about more than 50% of waking hours)  3 - Symptomatic, >50% in bed, but not bedbound (Capable of only limited self-care, confined to bed or chair 50% or more of waking hours)  4 - Bedbound (Completely disabled. Cannot carry on any self-care. Totally confined to bed or chair)  5 - Death   Eustace Pen MM, Creech RH, Tormey DC, et al. [phone]). "Toxicity and response criteria of the [facility]". [place] Oncol. 5 (6): 649-55  LABORATORY DATA:  Lab Results  Component Value Date   WBC 9.2 10/05/2018   HGB 14.1 10/05/2018   HCT 40.9 10/05/2018   MCV 93.0 10/05/2018   PLT 280 10/05/2018   Lab Results  Component Value Date   NA 138 10/05/2018   K 4.0 10/05/2018   CL 101 10/05/2018   CO2 26 10/05/2018   Lab Results  Component Value Date   ALT 18 10/05/2018   AST 15 10/05/2018   ALKPHOS 54 10/05/2018   BILITOT 0.5 10/05/2018    PULMONARY FUNCTION TEST:   Recent Review Flowsheet Data    There is no flowsheet data to display.      RADIOGRAPHY: US Breast Ltd Uni Right Inc Axilla  Result Date: 09/23/2018 CLINICAL DATA:  Patient presents with  a palpable lump in the 12 o'clock position of the right breast. She has no family history of breast carcinoma. EXAM: DIGITAL DIAGNOSTIC BILATERAL MAMMOGRAM WITH CAD AND TOMO ULTRASOUND RIGHT BREAST ULTRASOUND LEFT AXILLA COMPARISON:  None. ACR Breast Density Category c: The breast tissue is heterogeneously dense, which may obscure small masses. FINDINGS: In the right breast there is a spiculated mass with associated architectural distortion and pleomorphic calcifications lying in the 11-12 o'clock position, middle depth, measuring 2 cm in size, corresponding to the palpable abnormality. There are no other breast masses, no other areas of architectural distortion and no other suspicious calcifications. There is a questionable abnormal lymph node in the left axilla. Mammographic images were processed with CAD. On physical exam, there is a firm but mobile mass in the 12 o'clock position of the right breast. Targeted ultrasound is performed, showing a hypoechoic irregular mass with partly ill-defined partly angular margins, as well as architectural distortion, and multiple internal echogenic foci consistent with calcifications. Mass lies at 12 o'clock, 2 cm the nipple measuring 2.1 x 1.0 x 1.9 cm. This corresponds to the palpable abnormality. Sonographic evaluation of the right axilla shows no enlarged or abnormal lymph nodes. One deep right axillary lymph node shows prominent cortex measuring between 3 and 4 mm. Sonographic evaluation of the left axilla shows no enlarged lymph nodes. Several lymph nodes show areas of cortical prominence, greatest thickness between 3 and 4 mm, all similar to the lymph node noted on the right. Given the symmetry of these lymph nodes, these are presumed reactive. IMPRESSION: 1. Highly suspicious 2.1 cm mass in the 12 o'clock position of right breast. Tissue sampling is indicated. 2. No other evidence of breast malignancy. RECOMMENDATION: 1. Ultrasound-guided core needle biopsy of the  right breast mass. This has been scheduled for 7:30 a.m. on 09/26/2018. I have discussed the findings and recommendations with the patient. If applicable, a reminder letter will be sent to the patient regarding the next appointment. BI-RADS CATEGORY  5: Highly suggestive of malignancy. Electronically Signed   By: Lajean Manes M.D.   On: 09/23/2018 12:21   Mm Diag Breast Tomo Bilateral  Result Date: 09/23/2018 CLINICAL DATA:  Patient presents with a palpable lump in the 12 o'clock position of the right  breast. She has no family history of breast carcinoma. EXAM: DIGITAL DIAGNOSTIC BILATERAL MAMMOGRAM WITH CAD AND TOMO ULTRASOUND RIGHT BREAST ULTRASOUND LEFT AXILLA COMPARISON:  None. ACR Breast Density Category c: The breast tissue is heterogeneously dense, which may obscure small masses. FINDINGS: In the right breast there is a spiculated mass with associated architectural distortion and pleomorphic calcifications lying in the 11-12 o'clock position, middle depth, measuring 2 cm in size, corresponding to the palpable abnormality. There are no other breast masses, no other areas of architectural distortion and no other suspicious calcifications. There is a questionable abnormal lymph node in the left axilla. Mammographic images were processed with CAD. On physical exam, there is a firm but mobile mass in the 12 o'clock position of the right breast. Targeted ultrasound is performed, showing a hypoechoic irregular mass with partly ill-defined partly angular margins, as well as architectural distortion, and multiple internal echogenic foci consistent with calcifications. Mass lies at 12 o'clock, 2 cm the nipple measuring 2.1 x 1.0 x 1.9 cm. This corresponds to the palpable abnormality. Sonographic evaluation of the right axilla shows no enlarged or abnormal lymph nodes. One deep right axillary lymph node shows prominent cortex measuring between 3 and 4 mm. Sonographic evaluation of the left axilla shows no enlarged  lymph nodes. Several lymph nodes show areas of cortical prominence, greatest thickness between 3 and 4 mm, all similar to the lymph node noted on the right. Given the symmetry of these lymph nodes, these are presumed reactive. IMPRESSION: 1. Highly suspicious 2.1 cm mass in the 12 o'clock position of right breast. Tissue sampling is indicated. 2. No other evidence of breast malignancy. RECOMMENDATION: 1. Ultrasound-guided core needle biopsy of the right breast mass. This has been scheduled for 7:30 a.m. on 09/26/2018. I have discussed the findings and recommendations with the patient. If applicable, a reminder letter will be sent to the patient regarding the next appointment. BI-RADS CATEGORY  5: Highly suggestive of malignancy. Electronically Signed   By: Lajean Manes M.D.   On: 09/23/2018 12:21   [place] Axilla Left  Result Date: 09/23/2018 CLINICAL DATA:  Patient presents with a palpable lump in the 12 o'clock position of the right breast. She has no family history of breast carcinoma. EXAM: DIGITAL DIAGNOSTIC BILATERAL MAMMOGRAM WITH CAD AND TOMO ULTRASOUND RIGHT BREAST ULTRASOUND LEFT AXILLA COMPARISON:  None. ACR Breast Density Category c: The breast tissue is heterogeneously dense, which may obscure small masses. FINDINGS: In the right breast there is a spiculated mass with associated architectural distortion and pleomorphic calcifications lying in the 11-12 o'clock position, middle depth, measuring 2 cm in size, corresponding to the palpable abnormality. There are no other breast masses, no other areas of architectural distortion and no other suspicious calcifications. There is a questionable abnormal lymph node in the left axilla. Mammographic images were processed with CAD. On physical exam, there is a firm but mobile mass in the 12 o'clock position of the right breast. Targeted ultrasound is performed, showing a hypoechoic irregular mass with partly ill-defined partly angular margins, as well as  architectural distortion, and multiple internal echogenic foci consistent with calcifications. Mass lies at 12 o'clock, 2 cm the nipple measuring 2.1 x 1.0 x 1.9 cm. This corresponds to the palpable abnormality. Sonographic evaluation of the right axilla shows no enlarged or abnormal lymph nodes. One deep right axillary lymph node shows prominent cortex measuring between 3 and 4 mm. Sonographic evaluation of the left axilla shows no enlarged lymph nodes. Several lymph nodes  show areas of cortical prominence, greatest thickness between 3 and 4 mm, all similar to the lymph node noted on the right. Given the symmetry of these lymph nodes, these are presumed reactive. IMPRESSION: 1. Highly suspicious 2.1 cm mass in the 12 o'clock position of right breast. Tissue sampling is indicated. 2. No other evidence of breast malignancy. RECOMMENDATION: 1. Ultrasound-guided core needle biopsy of the right breast mass. This has been scheduled for 7:30 a.m. on 09/26/2018. I have discussed the findings and recommendations with the patient. If applicable, a reminder letter will be sent to the patient regarding the next appointment. BI-RADS CATEGORY  5: Highly suggestive of malignancy. Electronically Signed   By: Lajean Manes M.D.   On: 09/23/2018 12:21   Mm Clip Placement Right  Result Date: 09/26/2018 CLINICAL DATA:  Evaluate biopsy marker EXAM: DIAGNOSTIC RIGHT MAMMOGRAM POST ULTRASOUND BIOPSY COMPARISON:  Previous exam(s). FINDINGS: Mammographic images were obtained following ultrasound guided biopsy of a right breast mass. The ribbon shaped clip is within the biopsied mass. IMPRESSION: The ribbon shaped clip is within the biopsied mass. Final Assessment: Post Procedure Mammograms for Marker Placement Electronically Signed   By: Dorise Bullion III M.D   On: 09/26/2018 08:38   [place] Rt Breast Bx W Loc Dev 1st Lesion Img Bx Spec US Guide  Addendum Date: 09/27/2018   ADDENDUM REPORT: 09/27/2018 12:38 ADDENDUM: Pathology  revealed GRADE II INVASIVE DUCTAL CARCINOMA, DUCTAL CARCINOMA IN SITU of the RIGHT breast, 12 o'clock. This was found to be concordant by Dr. Dorise Bullion. Pathology results were discussed with the patient by telephone. The patient reported doing well after the biopsy with tenderness at the site. Post biopsy instructions and care were reviewed and questions were answered. The patient was encouraged to call [facility] for any additional concerns. The patient was referred to [facility] on October 05, 2018. Pathology results reported by Stacie Acres, RN on 09/27/2018. Electronically Signed   By: Dorise Bullion III M.D   On: 09/27/2018 12:38   Result Date: 09/27/2018 CLINICAL DATA:  Biopsy of a right breast mass EXAM: ULTRASOUND GUIDED RIGHT BREAST CORE NEEDLE BIOPSY COMPARISON:  Previous exam(s). FINDINGS: I met with the patient and we discussed the procedure of ultrasound-guided biopsy, including benefits and alternatives. We discussed the high likelihood of a successful procedure. We discussed the risks of the procedure, including infection, bleeding, tissue injury, clip migration, and inadequate sampling. Informed written consent was given. The usual time-out protocol was performed immediately prior to the procedure. Lesion quadrant: 12 o'clock Using sterile technique and 1% Lidocaine as local anesthetic, under direct ultrasound visualization, a 12 gauge spring-loaded device was used to perform biopsy of the 12 o'clock right breast mass using a lateral approach. At the conclusion of the procedure a ribbon shaped tissue marker clip was deployed into the biopsy cavity. Follow up 2 view mammogram was performed and dictated separately. IMPRESSION: Ultrasound guided biopsy of the 12 o'clock right breast mass. No apparent complications. Electronically Signed: By: Dorise Bullion III M.D On: 09/26/2018 08:39       IMPRESSION: Stage T2 Mx Right Breast UOQ, Invasive Ductal Carcinoma with DCIS, ER+ / PR+ / Her2-, Grade 2  Patient will be a good candidate for breast conservation with radiotherapy to right breast. We discussed the general course of radiation, potential side effects, and toxicities with radiation and the patient is interested in this approach.  She is  also exploring mastectomy and immediate reconstruction and will meet with plastic surgeon to discuss this approach.   PLAN:  1. Genetic Testing 2. Probable Right Lumpectomy with sentinel lymph node biopsy (surgical approach to be determined at a later date) 3. Mammaprint testing on core, adjuvant chemotherapy as appropriate 4. Adjuvant Radiation therapy 5. Tamoxifen   ------------------------------------------------  Blair Promise, PhD, MD  This document serves as a record of services personally performed by Gery Pray, MD. It was created on his behalf by Wilburn Mylar, a trained medical scribe. The creation of this record is based on the scribe's personal observations and the provider's statements to them. This document has been checked and approved by the attending provider.

## 2018-10-05 NOTE — Telephone Encounter (Signed)
Ordered mammaprint (core)per Dr. Magrinat. Faxed requisition to [facility] 

## 2018-10-06 ENCOUNTER — Telehealth: Payer: Self-pay | Admitting: Oncology

## 2018-10-06 NOTE — Telephone Encounter (Signed)
I left a message regarding schedule  

## 2018-10-11 ENCOUNTER — Ambulatory Visit (INDEPENDENT_AMBULATORY_CARE_PROVIDER_SITE_OTHER): Payer: BC Managed Care – PPO | Admitting: Plastic Surgery

## 2018-10-11 ENCOUNTER — Other Ambulatory Visit: Payer: Self-pay

## 2018-10-11 ENCOUNTER — Encounter: Payer: Self-pay | Admitting: Plastic Surgery

## 2018-10-11 VITALS — BP 128/83 | HR 84 | Temp 97.8°F | Ht 64.0 in | Wt 177.0 lb

## 2018-10-11 DIAGNOSIS — Z170 Estrogen receptor positive status [ER+]: Secondary | ICD-10-CM | POA: Diagnosis not present

## 2018-10-11 DIAGNOSIS — C50411 Malignant neoplasm of upper-outer quadrant of right female breast: Secondary | ICD-10-CM | POA: Diagnosis not present

## 2018-10-11 NOTE — Progress Notes (Signed)
Patient ID: Kaylee Romero, female    DOB: 1980/04/14, 38 y.o.   MRN: [id number]   Chief Complaint  Patient presents with  . Breast Cancer    The patient is a 38 year old white female here for a consultation for breast reconstruction.  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  The lesion biopsied was at the 12 o'clock position of the right breast.  She has been to genetic counseling and should have the results back in 1 week.  She is 5 feet 4 inches tall and weighs 177 pounds.  Preoperative bra size is a 36 D/DD.  She has thought about a breast reduction for years so she is fine with being smaller.  She has a changing skin lesion on her right chest and left leg that are concerning.  They have been there for several months and seems to be getting red and scaly. She has a 52 month old daughter.  She is interested in a partial mastectomy with symmetry surgery and reduction if her genetics is negative.     Review of Systems  Constitutional: Negative.  Negative for activity change and appetite change.  HENT: Negative.   Eyes: Negative.   Respiratory: Negative.  Negative for chest tightness.   Cardiovascular: Negative.  Negative for leg swelling.  Gastrointestinal: Negative.  Negative for abdominal distention and abdominal pain.  Endocrine: Negative.   Genitourinary: Negative.   Musculoskeletal: Negative.   Skin: Negative.  Negative for color change and wound.  Hematological: Negative.   Psychiatric/Behavioral: Negative.     Past Medical History:  Diagnosis Date  . Anxiety   . Family history of breast cancer   . HSV (herpes simplex virus) anogenital infection    positive titer only    Past Surgical History:  Procedure Laterality Date  . TONSILLECTOMY  1987  . TONSILLECTOMY    . WISDOM TOOTH EXTRACTION        Current Outpatient Medications:  .   amphetamine-dextroamphetamine (ADDERALL XR) 20 MG 24 hr capsule, Take 20 mg by mouth daily., Disp: , Rfl:  .  citalopram (CELEXA) 20 MG tablet, Take 20 mg by mouth daily., Disp: , Rfl:  .  hydrOXYzine (ATARAX/VISTARIL) 10 MG tablet, Take 10 mg by mouth as needed., Disp: , Rfl:  .  ibuprofen (ADVIL,MOTRIN) 600 MG tablet, Take 1 tablet (600 mg total) by mouth every 6 (six) hours., Disp: 30 tablet, Rfl: 0 .  labetalol (NORMODYNE) 200 MG tablet, Take 1 tablet (200 mg total) by mouth 2 (two) times daily. (Patient not taking: Reported on 10/05/2018), Disp: 60 tablet, Rfl: 0 .  Prenatal Vit-Fe Fumarate-FA (PRENATAL MULTIVITAMIN) TABS tablet, Take 1 tablet by mouth daily at 12 noon., Disp: , Rfl:  .  tamoxifen (NOLVADEX) 20 MG tablet, Take 1 tablet (20 mg total) by mouth daily., Disp: 90 tablet, Rfl: 12 .  tetrahydrozoline 0.05 % ophthalmic solution, Place 1 drop into both eyes daily., Disp: , Rfl:  .  vitamin B-12 (CYANOCOBALAMIN) 1000 MCG tablet, Take 1,000 mcg by mouth daily., Disp: , Rfl:    Objective:   There were no vitals filed for this visit.  Physical Exam Vitals signs and nursing note reviewed.  Constitutional:      Appearance: Normal appearance.  HENT:     Head: Normocephalic and atraumatic.  Eyes:     Extraocular Movements: Extraocular movements intact.  Neck:  Musculoskeletal: Normal range of motion.  Cardiovascular:     Rate and Rhythm: Normal rate.     Pulses: Normal pulses.  Pulmonary:     Effort: Pulmonary effort is normal. No respiratory distress.  Abdominal:     General: Abdomen is flat. There is no distension.     Tenderness: There is no abdominal tenderness.  Skin:    General: Skin is warm.     Capillary Refill: Capillary refill takes less than 2 seconds.  Neurological:     General: No focal deficit present.     Mental Status: She is alert and oriented to person, place, and time.  Psychiatric:        Mood and Affect: Mood normal.        Behavior: Behavior  normal.        Thought Content: Thought content normal.     Assessment & Plan:  Malignant neoplasm of upper-outer quadrant of right breast in female, estrogen receptor positive ([place])  If genetics is negative: Plan for bilateral breast reduction at the time of the partial right mastectomy. If genetics is positive: We will discuss further.  The risk that can be encountered with breast reduction were discussed and include the following but not limited to these:  Breast asymmetry, fluid accumulation, firmness of the breast, inability to breast feed, loss of nipple or areola, skin loss, decrease or no nipple sensation, fat necrosis of the breast tissue, bleeding, infection, healing delay.  There are risks of anesthesia, changes to skin sensation and injury to nerves or blood vessels.  The muscle can be temporarily or permanently injured.  You may have an allergic reaction to tape, suture, glue, blood products which can result in skin discoloration, swelling, pain, skin lesions, poor healing.  Any of these can lead to the need for revisonal surgery or stage procedures.  A reduction has potential to interfere with diagnostic procedures.  Nipple or breast piercing can increase risks of infection.  This procedure is best done when the breast is fully developed.  Changes in the breast will continue to occur over time.  Pregnancy can alter the outcomes of previous breast reduction surgery, weight gain and weigh loss can also effect the long term appearance.  Patient is aware that her best chance at symmetry is to left-sided bilateral reduction.  Decrease the additional surgery. I have reviewed her pathology report, consulting reports and spoken with Dr. [name] about the above information.  [place], DO

## 2018-10-12 ENCOUNTER — Telehealth: Payer: Self-pay

## 2018-10-12 NOTE — Telephone Encounter (Signed)
Nutrition  Patient attended Breast Clinic on 10/05/2018 and received nutrition packet from nurse navigator.    RD called to introduce self and service at [facility].  No answer.  Left message with call back number.  Kaylee Romero, [place], [place] Registered Dietitian [phone] (pager)

## 2018-10-13 ENCOUNTER — Telehealth: Payer: Self-pay | Admitting: *Deleted

## 2018-10-13 ENCOUNTER — Telehealth: Payer: Self-pay | Admitting: Licensed Clinical Social Worker

## 2018-10-13 ENCOUNTER — Ambulatory Visit: Payer: Self-pay | Admitting: Licensed Clinical Social Worker

## 2018-10-13 ENCOUNTER — Encounter: Payer: Self-pay | Admitting: Licensed Clinical Social Worker

## 2018-10-13 ENCOUNTER — Ambulatory Visit: Payer: Self-pay | Admitting: General Surgery

## 2018-10-13 DIAGNOSIS — C50411 Malignant neoplasm of upper-outer quadrant of right female breast: Secondary | ICD-10-CM

## 2018-10-13 DIAGNOSIS — Z1379 Encounter for other screening for genetic and chromosomal anomalies: Secondary | ICD-10-CM | POA: Insufficient documentation

## 2018-10-13 DIAGNOSIS — Z803 Family history of malignant neoplasm of breast: Secondary | ICD-10-CM

## 2018-10-13 NOTE — Telephone Encounter (Signed)
Received Mammaprint result of high risk on Core bx. Physician team notified. Called pt with results and discussed chemo would be recommended based on these results. Pt relate she has decided on surgical option of lumpectomy/reducation. Physician team notified. Discussed possible time line and care plan.  Denies further needs or questions at this time. Encourage pt to call with needs.

## 2018-10-13 NOTE — Progress Notes (Signed)
HPI:  Kaylee Romero was previously seen in the [place] clinic due to her recent diagnosis of breast cancer and concerns regarding a hereditary predisposition to cancer. Please refer to our prior cancer genetics clinic note for more information regarding our discussion, assessment and recommendations, at the time. Kaylee Romero recent genetic test results were disclosed to her, as were recommendations warranted by these results. These results and recommendations are discussed in more detail below.  CANCER HISTORY:  Oncology History  Malignant neoplasm of upper-outer quadrant of right breast in female, estrogen receptor positive ([place])  09/29/2018 Initial Diagnosis   Malignant neoplasm of upper-outer quadrant of right breast in female, estrogen receptor positive ([place])    Genetic Testing   Negative genetic testing. No pathogenic variants identified on the [facility] Breast Cancer STAT Panel + Common Hereditary Cancers Panel. The Common Hereditary Cancers Panel offered by [facility] includes sequencing and/or deletion duplication testing of the following 47 genes: APC, ATM, AXIN2, BARD1, BMPR1A, BRCA1, BRCA2, BRIP1, CDH1, CDKN2A (p14ARF), CDKN2A (p16INK4a), CKD4, CHEK2, CTNNA1, DICER1, EPCAM (Deletion/duplication testing only), GREM1 (promoter region deletion/duplication testing only), KIT, MEN1, MLH1, MSH2, MSH3, MSH6, MUTYH, NBN, NF1, NHTL1, PALB2, PDGFRA, PMS2, POLD1, POLE, PTEN, RAD50, RAD51C, RAD51D, SDHB, SDHC, SDHD, SMAD4, SMARCA4. STK11, TP53, TSC1, TSC2, and VHL.  The following genes were evaluated for sequence changes only: SDHA and HOXB13 c.251G>A variant only. The report date is 10/13/2018.      FAMILY HISTORY:  We obtained a detailed, 4-generation family history.  Significant diagnoses are listed below: Family History  Problem Relation Age of Onset   Hypertension Father    Alcohol abuse Paternal Grandfather    Heart disease Paternal Grandfather    Alcohol abuse Maternal  Grandfather    Heart disease Maternal Grandmother    Breast cancer Other     Kaylee Romero has one daughter, 33 months old. She has one paternal half brother, 19, and one maternal half brother, 62, neither have had cancer and neither have children.  Kaylee Romero mother is living at 56. Patient has 1 maternal uncle, no cancers. No cancer in her maternal cousin. Her maternal grandmother died at 23. This grandmother had 6 sisters, one had breast cancer twice and is still living. She does not have information about maternal grandfather.  Kaylee Romero father is living at 76. Patient has 1 paternal aunt, no cancer. No cancers in paternal cousins. Her paternal grandmother died at 42, grandfather died at 63, no cancer diagnoses.   Kaylee Romero is unaware of previous family history of genetic testing for hereditary cancer risks. Patient's maternal ancestors are of [place] descent, and paternal ancestors are of English descent. There is no reported Ashkenazi Jewish ancestry. There is no known consanguinity.  GENETIC TEST RESULTS: Genetic testing reported out on 10/13/2018 through the [facility] Breast Cancer STAT Panel + Common Hereditary cancer panel found no pathogenic mutations.   The STAT Breast cancer panel offered by [facility] includes sequencing and rearrangement analysis for the following 9 genes:  ATM, BRCA1, BRCA2, CDH1, CHEK2, PALB2, PTEN, STK11 and TP53.    The Common Hereditary Cancers Panel offered by [facility] includes sequencing and/or deletion duplication testing of the following 47 genes: APC, ATM, AXIN2, BARD1, BMPR1A, BRCA1, BRCA2, BRIP1, CDH1, CDKN2A (p14ARF), CDKN2A (p16INK4a), CKD4, CHEK2, CTNNA1, DICER1, EPCAM (Deletion/duplication testing only), GREM1 (promoter region deletion/duplication testing only), KIT, MEN1, MLH1, MSH2, MSH3, MSH6, MUTYH, NBN, NF1, NHTL1, PALB2, PDGFRA, PMS2, POLD1, POLE, PTEN, RAD50, RAD51C, RAD51D, SDHB, SDHC, SDHD, SMAD4, SMARCA4. STK11, TP53,  TSC1, TSC2, and  VHL.  The following genes were evaluated for sequence changes only: SDHA and HOXB13 c.251G>A variant only.   The test report has been scanned into EPIC and is located under the Molecular Pathology section of the Results Review tab.  A portion of the result report is included below for reference.     We discussed with Kaylee Romero that because current genetic testing is not perfect, it is possible there may be a gene mutation in one of these genes that current testing cannot detect, but that chance is small.  We also discussed, that there could be another gene that has not yet been discovered, or that we have not yet tested, that is responsible for the cancer diagnoses in the family. It is also possible there is a hereditary cause for the cancer in the family that Kaylee Romero did not inherit and therefore was not identified in her testing.  Therefore, it is important to remain in touch with cancer genetics in the future so that we can continue to offer Kaylee Romero the most up to date genetic testing.   ADDITIONAL GENETIC TESTING: We discussed with Kaylee Romero that her genetic testing was fairly extensive.  If there are genes identified to increase cancer risk that can be analyzed in the future, we would be happy to discuss and coordinate this testing at that time.    CANCER SCREENING RECOMMENDATIONS: Kaylee Romero test result is considered negative (normal).  This means that we have not identified a hereditary cause for her  personal and family history of cancer at this time. Most cancers happen by chance and this negative test suggests that her cancer may fall into this category.    While reassuring, this does not definitively rule out a hereditary predisposition to cancer. It is still possible that there could be genetic mutations that are undetectable by current technology. There could be genetic mutations in genes that have not been tested or identified to increase cancer risk.  Therefore, it is  recommended she continue to follow the cancer management and screening guidelines provided by her oncology and primary healthcare provider.   An individual's cancer risk and medical management are not determined by genetic test results alone. Overall cancer risk assessment incorporates additional factors, including personal medical history, family history, and any available genetic information that may result in a personalized plan for cancer prevention and surveillance.  RECOMMENDATIONS FOR FAMILY MEMBERS:  Relatives in this family might be at some increased risk of developing cancer, over the general population risk, simply due to the family history of cancer.  We recommended female relatives in this family have a yearly mammogram beginning at age 42, or 24 years younger than the earliest onset of cancer, an annual clinical breast exam, and perform monthly breast self-exams. Female relatives in this family should also have a gynecological exam as recommended by their primary provider. All family members should have a colonoscopy by age 42, or as directed by their physicians.  FOLLOW-UP: Lastly, we discussed with Kaylee Romero that cancer genetics is a rapidly advancing field and it is possible that new genetic tests will be appropriate for her and/or her family members in the future. We encouraged her to remain in contact with cancer genetics on an annual basis so we can update her personal and family histories and let her know of advances in cancer genetics that may benefit this family.   Our contact number was provided. Kaylee Romero questions were answered to  her satisfaction, and she knows she is welcome to call us at anytime with additional questions or concerns.   Faith Rogue, MS, [facility] Genetic Counselor [email or web address] Phone: [phone]

## 2018-10-13 NOTE — Telephone Encounter (Signed)
Revealed negative genetic testing.   We discussed that we do not know why she has breast cancer or why there is cancer in the family. It could be due to a different gene that we are not testing, or something our current technology cannot pick up.  It will be important for her to keep in contact with genetics to learn if additional testing may be needed in the future.  

## 2018-10-14 ENCOUNTER — Encounter: Payer: Self-pay | Admitting: General Practice

## 2018-10-14 ENCOUNTER — Telehealth: Payer: Self-pay | Admitting: Oncology

## 2018-10-14 ENCOUNTER — Inpatient Hospital Stay: Payer: BC Managed Care – PPO | Attending: Oncology | Admitting: Oncology

## 2018-10-14 DIAGNOSIS — E663 Overweight: Secondary | ICD-10-CM

## 2018-10-14 DIAGNOSIS — C50411 Malignant neoplasm of upper-outer quadrant of right female breast: Secondary | ICD-10-CM | POA: Diagnosis not present

## 2018-10-14 DIAGNOSIS — Z170 Estrogen receptor positive status [ER+]: Secondary | ICD-10-CM

## 2018-10-14 NOTE — Progress Notes (Signed)
[place] Psychosocial Distress Screening [place] by phone following Breast Multidisciplinary Clinic to introduce [place] team/resources, reviewing distress screen per protocol.  The patient scored a 5 on the Psychosocial Distress Thermometer which indicates moderate distress. Also assessed for distress and other psychosocial needs.   ONCBCN DISTRESS SCREENING 10/14/2018  Screening Type Initial Screening  Distress experienced in past week (1-10) 5  Practical problem type Work/school  Emotional problem type Nervousness/Anxiety;Adjusting to illness  Information Concerns Type Lack of info about diagnosis;Lack of info about treatment;Lack of info about complementary therapy choices;Lack of info about maintaining fitness  Physical Problem type Sleep/insomnia  Referral to support programs Yes    Follow up needed: No. Followed up by email because phone call wasn't at a convenient time, encouraging callback.   [place], [place], [facility] Pager [phone] Voicemail [phone]

## 2018-10-14 NOTE — Telephone Encounter (Signed)
Called patient regarding upcoming Webex appointment, patient received e-mail.

## 2018-10-14 NOTE — Progress Notes (Signed)
START ON PATHWAY REGIMEN - Breast     A cycle is every 21 days:     Docetaxel      Cyclophosphamide   **Always confirm dose/schedule in your pharmacy ordering system**  Patient Characteristics: Postoperative without Neoadjuvant Therapy (Pathologic Staging), Invasive Disease, Adjuvant Therapy, HER2 Negative/Unknown/Equivocal, ER Positive, Node Negative, pT1a-c, pN0/N75m or pT2 or Higher, pN0, MammaPrint(R), High Genomic Risk Therapeutic Status: Postoperative without Neoadjuvant Therapy (Pathologic Staging) AJCC Grade: GX AJCC N Category: pNX AJCC M Category: cM0 ER Status: Positive (+) AJCC 8 Stage Grouping: IB HER2 Status: Negative (-) Oncotype Dx Recurrence Score: Ordered Other Genomic Test AJCC T Category: pTX PR Status: Positive (+) Has this patient completed genomic testing<= Yes - MammaPrint(R) MammaPrint(R) Score: High Genomic Risk Intent of Therapy: Curative Intent, Discussed with Patient

## 2018-10-14 NOTE — Progress Notes (Signed)
[place]  [phone]) [phone] Fax:(336) [phone]     ID: Kaylee Romero DOB: 1980/09/02  MR#: [id number]  NUU#:[id number]  Patient Care Team: Kaylee Hedges, DO as PCP - General (Obstetrics and Gynecology) Kaylee Kaufmann, RN as Oncology Nurse Navigator Kaylee Germany, RN as Oncology Nurse Navigator Kaylee Pray, MD as Consulting Physician (Radiation Oncology) Kaylee Romero, Kaylee Dad, MD as Consulting Physician (Oncology) Kaylee Kussmaul, MD as Consulting Physician (General Surgery) Kaylee Cruel, MD OTHER MD:  CHIEF COMPLAINT: estrogen receptor positive breast cancer  CURRENT TREATMENT: Awaiting definitive surgery   I connected with Kaylee Romero on 10/14/18 at  2:30 PM EDT by video enabled telemedicine visit and verified that I am speaking with the correct person using two identifiers.   I discussed the limitations, risks, security and privacy concerns of performing an evaluation and management service by telemedicine and the availability of in-person appointments. I also discussed with the patient that there may be a patient responsible charge related to this service. The patient expressed understanding and agreed to proceed.   Other persons participating in the visit and their role in the encounter: None  Patients location: Home  Providers location: Clinic   Chief Complaint:  estrogen receptor positive breast cancer    HISTORY OF CURRENT ILLNESS: From the original intake note:  Kaylee Romero (pronounced "Bram-Hall") found a palpable lump in the 12 o'clock position of the right breast, which he brought to the attention of her gynecologist.. She underwent bilateral diagnostic mammography with tomography and bilateral breast ultrasonography at [facility] on 09/23/2018 showing: breast density category C; a spiculated mass with associated with architectural distortion and pleomorphic calcifications lying in the 11-12 o'clock position, middle depth,  measuring 2 cm in size, corresponding to the palpable abnormality.   Physical exam confirmed a firm but mobile mass in the 12 o'clock position of the right breast.  By ultrasound there was a highly suspicious 2.1 cm mass in the 12 o'clock position of the right breast; no other evidence of breast malignancy or lymphadenopathy bilaterally.Sonographic evaluation of the right axilla found no enlarged or abnormal appearing lymph node  In the left axilla there were several lymph nodes with areas of cortical prominence similar to the lymph node noted on the right.  Given the symmetry all these are presumed to be reactive  Accordingly on 09/26/2018 she proceeded to biopsy of the right breast area in question. The pathology from this procedure ([id number]) showed: invasive ductal carcinoma, grade 2; prognostic indicators significant for: estrogen receptor, 80% positive with moderate staining intensity and progesterone receptor, 90% positive with strong staining intensity. Proliferation marker Ki67 at 35%. HER2 negative by immunohistochemistry (1+).  The patient's subsequent history is as detailed below.   INTERVAL HISTORY:  Kaylee Romero is contacted today for follow-up and treatment of her estrogen receptor positive breast cancer. She was last seen here on 10/05/2018.   She continues on tamoxifen.  She is tolerating this remarkably well, with no side effects other than the fact that she has not had a period since starting.  Recall she has an IUD in place.  Since her last visit here, she met with Dr. Marla Romero on 10/11/2018 to discuss surgical options. She is interested in a partial mastectomy with symmetry surgery and reduction if her genetics is negative, as is the case:   She also underwent Genetic testing reported on 10/05/2018 through the [facility] Breast Cancer STAT Panel + Common Hereditary cancer panel found no pathogenic mutations. The STAT  Breast cancer panel offered by [facility] includes sequencing and  rearrangement analysis for the following 9 genes:  ATM, BRCA1, BRCA2, CDH1, CHEK2, PALB2, PTEN, STK11 and TP53. The Common Hereditary Cancers Panel offered by [facility] includes sequencing and/or deletion duplication testing of the following 47 genes: APC, ATM, AXIN2, BARD1, BMPR1A, BRCA1, BRCA2, BRIP1, CDH1, CDKN2A (p14ARF), CDKN2A (p16INK4a), CKD4, CHEK2, CTNNA1, DICER1, EPCAM (Deletion/duplication testing only), GREM1 (promoter region deletion/duplication testing only), KIT, MEN1, MLH1, MSH2, MSH3, MSH6, MUTYH, NBN, NF1, NHTL1, PALB2, PDGFRA, PMS2, POLD1, POLE, PTEN, RAD50, RAD51C, RAD51D, SDHB, SDHC, SDHD, SMAD4, SMARCA4. STK11, TP53, TSC1, TSC2, and VHL.  The following genes were evaluated for sequence changes only: SDHA and HOXB13 c.251G>A variant only.   Her MammaPrint came back "high risk" so she wanted to discuss this today prior to proceeding to surgery.  REVIEW OF SYSTEMS: Kaylee Romero is working from home, and is taking appropriate pandemic precautions.  Detailed review of systems today was otherwise entirely benign.  PAST MEDICAL HISTORY: Past Medical History:  Diagnosis Date   Anxiety    Family history of breast cancer    HSV (herpes simplex virus) anogenital infection    positive titer only    PAST SURGICAL HISTORY: Past Surgical History:  Procedure Laterality Date   TONSILLECTOMY  49   TONSILLECTOMY     WISDOM TOOTH EXTRACTION      FAMILY HISTORY: Family History  Problem Relation Age of Onset   Hypertension Father    Alcohol abuse Paternal Grandfather    Heart disease Paternal Grandfather    Alcohol abuse Maternal Grandfather    Heart disease Maternal Grandmother    Breast cancer Other    Patient's parents are both living at age 45, as of 09/2018. Her father lives in [place], and her mother lives in [place], [place]. The patient denies a family hx of ovarian cancer. A maternal great aunt was diagnosed with breast cancer twice. She has 2 brothers, no  sisters   GYNECOLOGIC HISTORY:  No LMP recorded. Menarche: 38 years old Age at first live birth: 38 years old [place] P 1 Currently having regular periods  Contraceptive: Mirena IUD in place HRT n/a  Hysterectomy? no BSO? no   SOCIAL HISTORY: (updated 10/05/2018)  Kaylee Romero is a [employer]. Husband Tommi Rumps is in management with [employer]. Daughter Normand Sloop is 73 months old.     ADVANCED DIRECTIVES: not in place. Her husband is automatically her HCPOA.   HEALTH MAINTENANCE: Social History   Tobacco Use   Smoking status: Former Smoker    Packs/day: 0.50    Years: 10.00    Pack years: 5.00    Types: Cigarettes    Start date: 01/13/2002    Quit date: 10/17/2016    Years since quitting: 1.9   Smokeless tobacco: Never Used  Substance Use Topics   Alcohol use: Yes    Comment: Socially   Drug use: No     Colonoscopy: n/a  PAP: 09/20/2018  Bone density: n/a   No Known Allergies  Current Outpatient Medications  Medication Sig Dispense Refill   amphetamine-dextroamphetamine (ADDERALL XR) 20 MG 24 hr capsule Take 20 mg by mouth daily.     citalopram (CELEXA) 20 MG tablet Take 20 mg by mouth daily.     hydrOXYzine (ATARAX/VISTARIL) 10 MG tablet Take 10 mg by mouth as needed.     ibuprofen (ADVIL,MOTRIN) 600 MG tablet Take 1 tablet (600 mg total) by mouth every 6 (six) hours. 30 tablet 0   tamoxifen (NOLVADEX) 20 MG  tablet Take 1 tablet (20 mg total) by mouth daily. 90 tablet 12   vitamin B-12 (CYANOCOBALAMIN) 1000 MCG tablet Take 1,000 mcg by mouth daily.     No current facility-administered medications for this visit.     OBJECTIVE: Young white woman in no acute distress  There were no vitals filed for this visit. Wt Readings from Last 3 Encounters:  10/11/18 177 lb (80.3 kg)  10/05/18 179 lb 12.8 oz (81.6 kg)  06/18/17 215 lb (97.5 kg)   There is no height or weight on file to calculate BMI.    ECOG FS:1 - Symptomatic but completely  ambulatory  Telehealth Visit 10/14/18   LAB RESULTS:  CMP     Component Value Date/Time   NA 138 10/05/2018 1244   K 4.0 10/05/2018 1244   CL 101 10/05/2018 1244   CO2 26 10/05/2018 1244   GLUCOSE 97 10/05/2018 1244   BUN 12 10/05/2018 1244   CREATININE 0.68 10/05/2018 1244   CALCIUM 9.5 10/05/2018 1244   PROT 7.5 10/05/2018 1244   ALBUMIN 4.6 10/05/2018 1244   AST 15 10/05/2018 1244   ALT 18 10/05/2018 1244   ALKPHOS 54 10/05/2018 1244   BILITOT 0.5 10/05/2018 1244   GFRNONAA >60 10/05/2018 1244   GFRAA >60 10/05/2018 1244    No results found for: TOTALPROTELP, ALBUMINELP, A1GS, A2GS, BETS, BETA2SER, GAMS, MSPIKE, SPEI  No results found for: Nils Pyle, KAPLAMBRATIO  Lab Results  Component Value Date   WBC 9.2 10/05/2018   NEUTROABS 6.3 10/05/2018   HGB 14.1 10/05/2018   HCT 40.9 10/05/2018   MCV 93.0 10/05/2018   PLT 280 10/05/2018    '@LASTCHEMISTRY'$ @  No results found for: LABCA2  No components found for: OYDXAJ287  No results for input(s): INR in the last 168 hours.  No results found for: LABCA2  No results found for: OMV672  No results found for: CNO709  No results found for: GGE366  No results found for: CA2729  No components found for: HGQUANT  No results found for: CEA1 / No results found for: CEA1   No results found for: AFPTUMOR  No results found for: CHROMOGRNA  No results found for: PSA1  No visits with results within 3 Day(s) from this visit.  Latest known visit with results is:  Appointment on 10/05/2018  Component Date Value Ref Range Status   Sodium 10/05/2018 138  135 - 145 mmol/L Final   Potassium 10/05/2018 4.0  3.5 - 5.1 mmol/L Final   Chloride 10/05/2018 101  98 - 111 mmol/L Final   CO2 10/05/2018 26  22 - 32 mmol/L Final   Glucose, Bld 10/05/2018 97  70 - 99 mg/dL Final   BUN 10/05/2018 12  6 - 20 mg/dL Final   Creatinine 10/05/2018 0.68  0.44 - 1.00 mg/dL Final   Calcium 10/05/2018 9.5  8.9 -  10.3 mg/dL Final   Total Protein 10/05/2018 7.5  6.5 - 8.1 g/dL Final   Albumin 10/05/2018 4.6  3.5 - 5.0 g/dL Final   AST 10/05/2018 15  15 - 41 U/L Final   ALT 10/05/2018 18  0 - 44 U/L Final   Alkaline Phosphatase 10/05/2018 54  38 - 126 U/L Final   Total Bilirubin 10/05/2018 0.5  0.3 - 1.2 mg/dL Final   GFR, Est Non Af Am 10/05/2018 >60  >60 mL/min Final   GFR, Est AFR Am 10/05/2018 >60  >60 mL/min Final   Anion gap 10/05/2018 11  5 - 15  Final   Performed at [facility], [place] [address] [place], [place] [address]   WBC Count 10/05/2018 9.2  4.0 - 10.5 K/uL Corrected   CORRECTED ON 09/30 AT 1517: PREVIOUSLY REPORTED AS >9.2   RBC 10/05/2018 4.40  3.87 - 5.11 MIL/uL Final   Hemoglobin 10/05/2018 14.1  12.0 - 15.0 g/dL Final   HCT 10/05/2018 40.9  36.0 - 46.0 % Final   MCV 10/05/2018 93.0  80.0 - 100.0 fL Final   MCH 10/05/2018 32.0  26.0 - 34.0 pg Final   MCHC 10/05/2018 34.5  30.0 - 36.0 g/dL Final   RDW 10/05/2018 11.9  11.5 - 15.5 % Final   Platelet Count 10/05/2018 280  150 - 400 K/uL Final   nRBC 10/05/2018 0.0  0.0 - 0.2 % Final   Neutrophils Relative % 10/05/2018 70  % Final   Neutro Abs 10/05/2018 6.3  1.7 - 7.7 K/uL Final   Lymphocytes Relative 10/05/2018 22  % Final   Lymphs Abs 10/05/2018 2.0  0.7 - 4.0 K/uL Final   Monocytes Relative 10/05/2018 6  % Final   Monocytes Absolute 10/05/2018 0.6  0.1 - 1.0 K/uL Final   Eosinophils Relative 10/05/2018 2  % Final   Eosinophils Absolute 10/05/2018 0.2  0.0 - 0.5 K/uL Final   Basophils Relative 10/05/2018 0  % Final   Basophils Absolute 10/05/2018 0.0  0.0 - 0.1 K/uL Final   Immature Granulocytes 10/05/2018 0  % Final   Abs Immature Granulocytes 10/05/2018 0.03  0.00 - 0.07 K/uL Final   Performed at [facility], [place] [address], [place], [place] [address]    (this displays the last labs from the last 3 days)  No results found for:  TOTALPROTELP, ALBUMINELP, A1GS, A2GS, BETS, BETA2SER, GAMS, MSPIKE, SPEI (this displays SPEP labs)  No results found for: KPAFRELGTCHN, LAMBDASER, KAPLAMBRATIO (kappa/lambda light chains)  No results found for: HGBA, HGBA2QUANT, HGBFQUANT, HGBSQUAN (Hemoglobinopathy evaluation)   No results found for: LDH  No results found for: IRON, TIBC, IRONPCTSAT (Iron and TIBC)  No results found for: FERRITIN  Urinalysis    Component Value Date/Time   BILIRUBINUR Neg 01/18/2012 1458   PROTEINUR Neg 01/18/2012 1458   UROBILINOGEN 0.2 01/18/2012 1458   NITRITE Neg 01/18/2012 1458   LEUKOCYTESUR Negative 01/18/2012 1458     STUDIES: US Breast Ltd Uni Right Inc Axilla  Result Date: 09/23/2018 CLINICAL DATA:  Patient presents with a palpable lump in the 12 o'clock position of the right breast. She has no family history of breast carcinoma. EXAM: DIGITAL DIAGNOSTIC BILATERAL MAMMOGRAM WITH CAD AND TOMO ULTRASOUND RIGHT BREAST ULTRASOUND LEFT AXILLA COMPARISON:  None. ACR Breast Density Category c: The breast tissue is heterogeneously dense, which may obscure small masses. FINDINGS: In the right breast there is a spiculated mass with associated architectural distortion and pleomorphic calcifications lying in the 11-12 o'clock position, middle depth, measuring 2 cm in size, corresponding to the palpable abnormality. There are no other breast masses, no other areas of architectural distortion and no other suspicious calcifications. There is a questionable abnormal lymph node in the left axilla. Mammographic images were processed with CAD. On physical exam, there is a firm but mobile mass in the 12 o'clock position of the right breast. Targeted ultrasound is performed, showing a hypoechoic irregular mass with partly ill-defined partly angular margins, as well as architectural distortion, and multiple internal echogenic foci consistent with calcifications. Mass lies at 12 o'clock, 2 cm the  nipple measuring  2.1 x 1.0 x 1.9 cm. This corresponds to the palpable abnormality. Sonographic evaluation of the right axilla shows no enlarged or abnormal lymph nodes. One deep right axillary lymph node shows prominent cortex measuring between 3 and 4 mm. Sonographic evaluation of the left axilla shows no enlarged lymph nodes. Several lymph nodes show areas of cortical prominence, greatest thickness between 3 and 4 mm, all similar to the lymph node noted on the right. Given the symmetry of these lymph nodes, these are presumed reactive. IMPRESSION: 1. Highly suspicious 2.1 cm mass in the 12 o'clock position of right breast. Tissue sampling is indicated. 2. No other evidence of breast malignancy. RECOMMENDATION: 1. Ultrasound-guided core needle biopsy of the right breast mass. This has been scheduled for 7:30 a.m. on 09/26/2018. I have discussed the findings and recommendations with the patient. If applicable, a reminder letter will be sent to the patient regarding the next appointment. BI-RADS CATEGORY  5: Highly suggestive of malignancy. Electronically Signed   By: Lajean Manes M.D.   On: 09/23/2018 12:21   Mm Diag Breast Tomo Bilateral  Result Date: 09/23/2018 CLINICAL DATA:  Patient presents with a palpable lump in the 12 o'clock position of the right breast. She has no family history of breast carcinoma. EXAM: DIGITAL DIAGNOSTIC BILATERAL MAMMOGRAM WITH CAD AND TOMO ULTRASOUND RIGHT BREAST ULTRASOUND LEFT AXILLA COMPARISON:  None. ACR Breast Density Category c: The breast tissue is heterogeneously dense, which may obscure small masses. FINDINGS: In the right breast there is a spiculated mass with associated architectural distortion and pleomorphic calcifications lying in the 11-12 o'clock position, middle depth, measuring 2 cm in size, corresponding to the palpable abnormality. There are no other breast masses, no other areas of architectural distortion and no other suspicious calcifications. There is a questionable  abnormal lymph node in the left axilla. Mammographic images were processed with CAD. On physical exam, there is a firm but mobile mass in the 12 o'clock position of the right breast. Targeted ultrasound is performed, showing a hypoechoic irregular mass with partly ill-defined partly angular margins, as well as architectural distortion, and multiple internal echogenic foci consistent with calcifications. Mass lies at 12 o'clock, 2 cm the nipple measuring 2.1 x 1.0 x 1.9 cm. This corresponds to the palpable abnormality. Sonographic evaluation of the right axilla shows no enlarged or abnormal lymph nodes. One deep right axillary lymph node shows prominent cortex measuring between 3 and 4 mm. Sonographic evaluation of the left axilla shows no enlarged lymph nodes. Several lymph nodes show areas of cortical prominence, greatest thickness between 3 and 4 mm, all similar to the lymph node noted on the right. Given the symmetry of these lymph nodes, these are presumed reactive. IMPRESSION: 1. Highly suspicious 2.1 cm mass in the 12 o'clock position of right breast. Tissue sampling is indicated. 2. No other evidence of breast malignancy. RECOMMENDATION: 1. Ultrasound-guided core needle biopsy of the right breast mass. This has been scheduled for 7:30 a.m. on 09/26/2018. I have discussed the findings and recommendations with the patient. If applicable, a reminder letter will be sent to the patient regarding the next appointment. BI-RADS CATEGORY  5: Highly suggestive of malignancy. Electronically Signed   By: Lajean Manes M.D.   On: 09/23/2018 12:21   [place] Axilla Left  Result Date: 09/23/2018 CLINICAL DATA:  Patient presents with a palpable lump in the 12 o'clock position of the right breast. She has no family history of breast carcinoma. EXAM: DIGITAL DIAGNOSTIC BILATERAL  MAMMOGRAM WITH CAD AND TOMO ULTRASOUND RIGHT BREAST ULTRASOUND LEFT AXILLA COMPARISON:  None. ACR Breast Density Category c: The breast tissue is  heterogeneously dense, which may obscure small masses. FINDINGS: In the right breast there is a spiculated mass with associated architectural distortion and pleomorphic calcifications lying in the 11-12 o'clock position, middle depth, measuring 2 cm in size, corresponding to the palpable abnormality. There are no other breast masses, no other areas of architectural distortion and no other suspicious calcifications. There is a questionable abnormal lymph node in the left axilla. Mammographic images were processed with CAD. On physical exam, there is a firm but mobile mass in the 12 o'clock position of the right breast. Targeted ultrasound is performed, showing a hypoechoic irregular mass with partly ill-defined partly angular margins, as well as architectural distortion, and multiple internal echogenic foci consistent with calcifications. Mass lies at 12 o'clock, 2 cm the nipple measuring 2.1 x 1.0 x 1.9 cm. This corresponds to the palpable abnormality. Sonographic evaluation of the right axilla shows no enlarged or abnormal lymph nodes. One deep right axillary lymph node shows prominent cortex measuring between 3 and 4 mm. Sonographic evaluation of the left axilla shows no enlarged lymph nodes. Several lymph nodes show areas of cortical prominence, greatest thickness between 3 and 4 mm, all similar to the lymph node noted on the right. Given the symmetry of these lymph nodes, these are presumed reactive. IMPRESSION: 1. Highly suspicious 2.1 cm mass in the 12 o'clock position of right breast. Tissue sampling is indicated. 2. No other evidence of breast malignancy. RECOMMENDATION: 1. Ultrasound-guided core needle biopsy of the right breast mass. This has been scheduled for 7:30 a.m. on 09/26/2018. I have discussed the findings and recommendations with the patient. If applicable, a reminder letter will be sent to the patient regarding the next appointment. BI-RADS CATEGORY  5: Highly suggestive of malignancy.  Electronically Signed   By: Lajean Manes M.D.   On: 09/23/2018 12:21   Mm Clip Placement Right  Result Date: 09/26/2018 CLINICAL DATA:  Evaluate biopsy marker EXAM: DIAGNOSTIC RIGHT MAMMOGRAM POST ULTRASOUND BIOPSY COMPARISON:  Previous exam(s). FINDINGS: Mammographic images were obtained following ultrasound guided biopsy of a right breast mass. The ribbon shaped clip is within the biopsied mass. IMPRESSION: The ribbon shaped clip is within the biopsied mass. Final Assessment: Post Procedure Mammograms for Marker Placement Electronically Signed   By: Dorise Bullion III M.D   On: 09/26/2018 08:38   [place] Rt Breast Bx W Loc Dev 1st Lesion Img Bx Spec US Guide  Addendum Date: 09/27/2018   ADDENDUM REPORT: 09/27/2018 12:38 ADDENDUM: Pathology revealed GRADE II INVASIVE DUCTAL CARCINOMA, DUCTAL CARCINOMA IN SITU of the RIGHT breast, 12 o'clock. This was found to be concordant by Dr. Dorise Bullion. Pathology results were discussed with the patient by telephone. The patient reported doing well after the biopsy with tenderness at the site. Post biopsy instructions and care were reviewed and questions were answered. The patient was encouraged to call [facility] for any additional concerns. The patient was referred to [facility] at [facility] on October 05, 2018. Pathology results reported by Stacie Acres, RN on 09/27/2018. Electronically Signed   By: Dorise Bullion III M.D   On: 09/27/2018 12:38   Result Date: 09/27/2018 CLINICAL DATA:  Biopsy of a right breast mass EXAM: ULTRASOUND GUIDED RIGHT BREAST CORE NEEDLE BIOPSY COMPARISON:  Previous exam(s). FINDINGS: I met with the  patient and we discussed the procedure of ultrasound-guided biopsy, including benefits and alternatives. We discussed the high likelihood of a successful procedure. We discussed the risks of the procedure, including infection, bleeding, tissue injury,  clip migration, and inadequate sampling. Informed written consent was given. The usual time-out protocol was performed immediately prior to the procedure. Lesion quadrant: 12 o'clock Using sterile technique and 1% Lidocaine as local anesthetic, under direct ultrasound visualization, a 12 gauge spring-loaded device was used to perform biopsy of the 12 o'clock right breast mass using a lateral approach. At the conclusion of the procedure a ribbon shaped tissue marker clip was deployed into the biopsy cavity. Follow up 2 view mammogram was performed and dictated separately. IMPRESSION: Ultrasound guided biopsy of the 12 o'clock right breast mass. No apparent complications. Electronically Signed: By: Dorise Bullion III M.D On: 09/26/2018 08:39    ELIGIBLE FOR AVAILABLE RESEARCH PROTOCOL: no  ASSESSMENT: 38 y.o. [place] woman status post right breast upper inner quadrant biopsy on 09/26/2018 for a clinical T2 N0, stage IB invasive ductal carcinoma, grade 2, estrogen and progesterone receptor positive, HER-2 nonamplified, with an MIB-1-1 of 35%  (1) Genetic testing reported on 10/05/2018 through the [facility] Breast Cancer STAT Panel + Common Hereditary cancer panel found no pathogenic mutations. The STAT Breast cancer panel offered by [facility] includes sequencing and rearrangement analysis for the following 9 genes:  ATM, BRCA1, BRCA2, CDH1, CHEK2, PALB2, PTEN, STK11 and TP53. The Common Hereditary Cancers Panel offered by [facility] includes sequencing and/or deletion duplication testing of the following 47 genes: APC, ATM, AXIN2, BARD1, BMPR1A, BRCA1, BRCA2, BRIP1, CDH1, CDKN2A (p14ARF), CDKN2A (p16INK4a), CKD4, CHEK2, CTNNA1, DICER1, EPCAM (Deletion/duplication testing only), GREM1 (promoter region deletion/duplication testing only), KIT, MEN1, MLH1, MSH2, MSH3, MSH6, MUTYH, NBN, NF1, NHTL1, PALB2, PDGFRA, PMS2, POLD1, POLE, PTEN, RAD50, RAD51C, RAD51D, SDHB, SDHC, SDHD, SMAD4, SMARCA4. STK11, TP53, TSC1, TSC2,  and VHL.  The following genes were evaluated for sequence changes only: SDHA and HOXB13 c.251G>A variant only.   (2) MammaPrint obtained from the original breast biopsy returned "high risk" indicating the patient will have an excellent prognosis if she receives adjuvant chemotherapy  (3) definitive surgery pending  (4) adjuvant chemotherapy will consist of cyclophosphamide and doxorubicin in dose dense fashion x4 followed by weekly paclitaxel x12  (5) adjuvant radiation to follow  (6) tamoxifen started 10/05/2018 in anticipation of surgical delays   PLAN: Llewellyn had many questions regarding mammograms and we went over the MINDACT study so she understands patients in her situation will receive chemotherapy did very well at 5 years, the difference in prognosis being more than 10%.  She understands that recurrence of this cancer generally means stage IV disease which would be incurable.  We discussed the possible toxicities side effects and complications of the chemotherapy that she will receive.  I am setting her up for an echocardiogram and meeting with our teaching nurse.  She is going to be having her surgery most likely in the next 2 to 3 weeks and once she fully recovers from that she can proceed to chemo.  She understands she will benefit from a port  I have encouraged her to call if she has any further questions.   Adwoa Axe, Kaylee Dad, MD  10/14/18 2:56 PM Medical Oncology and Hematology [facility] [place], [place] [address] Tel. [phone]    Fax. [phone]  I, Jacqualyn Posey am acting as a Education administrator for Kaylee Cruel, MD.   Joylene Grapes Andersen Mckiver  MD, have reviewed the above documentation for accuracy and completeness, and I agree with the above.

## 2018-10-17 ENCOUNTER — Encounter (HOSPITAL_COMMUNITY): Payer: Self-pay | Admitting: Oncology

## 2018-10-17 ENCOUNTER — Encounter: Payer: Self-pay | Admitting: *Deleted

## 2018-10-18 ENCOUNTER — Encounter: Payer: Self-pay | Admitting: Oncology

## 2018-10-19 ENCOUNTER — Encounter: Payer: Self-pay | Admitting: *Deleted

## 2018-10-20 ENCOUNTER — Encounter: Payer: Self-pay | Admitting: *Deleted

## 2018-10-24 ENCOUNTER — Inpatient Hospital Stay: Payer: BC Managed Care – PPO

## 2018-10-24 NOTE — Progress Notes (Signed)
Patient would like to have Patient ed session after surgery date and follow up with Dr Jana Hakim.  She will call to reschedule

## 2018-10-27 ENCOUNTER — Other Ambulatory Visit: Payer: Self-pay

## 2018-10-27 ENCOUNTER — Ambulatory Visit (HOSPITAL_COMMUNITY): Payer: BC Managed Care – PPO | Attending: Cardiology

## 2018-10-27 DIAGNOSIS — Z170 Estrogen receptor positive status [ER+]: Secondary | ICD-10-CM | POA: Insufficient documentation

## 2018-10-27 DIAGNOSIS — C50411 Malignant neoplasm of upper-outer quadrant of right female breast: Secondary | ICD-10-CM | POA: Diagnosis present

## 2018-10-27 DIAGNOSIS — E663 Overweight: Secondary | ICD-10-CM | POA: Insufficient documentation

## 2018-11-01 ENCOUNTER — Telehealth: Payer: Self-pay | Admitting: *Deleted

## 2018-11-01 NOTE — Telephone Encounter (Signed)
Return pt call regarding future chemo schedule and tx plan as well as time off after surgery. Denies further questions or needs at this time. Encourage pt to call with concerns. Received verbal understanding.

## 2018-11-08 ENCOUNTER — Encounter: Payer: BC Managed Care – PPO | Admitting: Nurse Practitioner

## 2018-11-15 ENCOUNTER — Other Ambulatory Visit: Payer: Self-pay

## 2018-11-15 ENCOUNTER — Ambulatory Visit (INDEPENDENT_AMBULATORY_CARE_PROVIDER_SITE_OTHER): Payer: BC Managed Care – PPO | Admitting: Plastic Surgery

## 2018-11-15 ENCOUNTER — Encounter: Payer: Self-pay | Admitting: Plastic Surgery

## 2018-11-15 VITALS — BP 134/84 | HR 93 | Temp 97.3°F | Ht 64.0 in | Wt 179.6 lb

## 2018-11-15 DIAGNOSIS — Z170 Estrogen receptor positive status [ER+]: Secondary | ICD-10-CM

## 2018-11-15 DIAGNOSIS — C50411 Malignant neoplasm of upper-outer quadrant of right female breast: Secondary | ICD-10-CM

## 2018-11-15 MED ORDER — CEPHALEXIN 500 MG PO CAPS: 500.0000 mg | ORAL_CAPSULE | Freq: Four times a day (QID) | ORAL | 0 refills | Status: AC

## 2018-11-15 MED ORDER — HYDROCODONE-ACETAMINOPHEN 5-325 MG PO TABS: 1.0000 | ORAL_TABLET | Freq: Four times a day (QID) | ORAL | 0 refills | Status: DC | PRN

## 2018-11-15 MED ORDER — ONDANSETRON HCL 4 MG PO TABS: 4.0000 mg | ORAL_TABLET | Freq: Three times a day (TID) | ORAL | 0 refills | Status: AC | PRN

## 2018-11-15 MED ORDER — DIAZEPAM 2 MG PO TABS: 2.0000 mg | ORAL_TABLET | Freq: Two times a day (BID) | ORAL | 0 refills | Status: DC | PRN

## 2018-11-15 NOTE — H&P (View-Only) (Signed)
Patient ID: Kaylee Romero, female    DOB: [birth year], 38 y.o.   MRN: [id number]   Chief Complaint  Patient presents with  . Pre-op Exam    (B) breast reduction  . Breast Cancer    The patient is a 38 year old white female here for her history and physical for breast surgery.  She is scheduled to undergo a partial right mastectomy with breast reduction and mastopexy bilaterally next week.  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  It is estrogen and progesterone positive and HER-2 negative.  The lesion was biopsied at the 12 o'clock position of the right breast.  Her preoperative bra size is a 36 D/DD.  She would like to be around a C cup.  She had a changing skin lesion of her right chest.  This was biopsied at the dermatology office last week and she is waiting on the results.  She is 5 feet 4 inches tall and weighs 179 pounds.  She has an 64-monthold daughter at home.  She has decided on a partial mastectomy of the right with bilateral breast reduction /mastopexy for symmetry. She is planning on starting chemo and radiation after the surgery possibly in January.    Review of Systems  Constitutional: Negative.  Negative for activity change and appetite change.  HENT: Negative.   Eyes: Negative.   Respiratory: Negative.   Cardiovascular: Negative.   Gastrointestinal: Negative.   Endocrine: Negative.   Genitourinary: Negative.   Musculoskeletal: Negative.   Neurological: Negative.   Hematological: Negative.   Psychiatric/Behavioral: Negative.     Past Medical History:  Diagnosis Date  . Anxiety   . Family history of breast cancer   . HSV (herpes simplex virus) anogenital infection    positive titer only    Past Surgical History:  Procedure Laterality Date  . TONSILLECTOMY  1987  . TONSILLECTOMY    . WISDOM TOOTH EXTRACTION        Current Outpatient Medications:  .  amphetamine-dextroamphetamine (ADDERALL XR) 20 MG 24 hr  capsule, Take 20 mg by mouth daily., Disp: , Rfl:  .  Ascorbic Acid (VITAMIN C) 1000 MG tablet, Take 1,000 mg by mouth daily., Disp: , Rfl:  .  citalopram (CELEXA) 20 MG tablet, Take 20 mg by mouth at bedtime. , Disp: , Rfl:  .  clobetasol cream (TEMOVATE) 00.09%, Apply 1 application topically daily., Disp: , Rfl:  .  hydrOXYzine (ATARAX/VISTARIL) 10 MG tablet, Take 10 mg by mouth at bedtime. , Disp: , Rfl:  .  ibuprofen (ADVIL) 200 MG tablet, Take 400 mg by mouth every 6 (six) hours as needed for headache or moderate pain., Disp: , Rfl:  .  ketoconazole (NIZORAL) 2 % cream, Apply 1 application topically 2 (two) times daily., Disp: , Rfl:  .  Multiple Vitamin (MULTIVITAMIN WITH MINERALS) TABS tablet, Take 1 tablet by mouth daily., Disp: , Rfl:  .  tamoxifen (NOLVADEX) 20 MG tablet, Take 20 mg by mouth at bedtime., Disp: , Rfl:  .  Tetrahydrozoline HCl (VISINE OP), Place 1 drop into both eyes daily as needed (redness)., Disp: , Rfl:  .  vitamin B-12 (CYANOCOBALAMIN) 1000 MCG tablet, Take 1,000 mcg by mouth daily., Disp: , Rfl:  .  cephALEXin (KEFLEX) 500 MG capsule, Take 1 capsule (500 mg total) by mouth 4 (four) times daily for 5 days., Disp: 20 capsule, Rfl: 0 .  diazepam (VALIUM) 2 MG tablet, Take 1 tablet (2  mg total) by mouth every 12 (twelve) hours as needed for muscle spasms., Disp: 30 tablet, Rfl: 0 .  HYDROcodone-acetaminophen (NORCO) 5-325 MG tablet, Take 1 tablet by mouth every 6 (six) hours as needed for moderate pain., Disp: 30 tablet, Rfl: 0 .  ondansetron (ZOFRAN) 4 MG tablet, Take 1 tablet (4 mg total) by mouth every 8 (eight) hours as needed for up to 5 days., Disp: 15 tablet, Rfl: 0   Objective:   Vitals:   11/15/18 1523  BP: 134/84  Pulse: 93  Temp: (!) 97.3 F (36.3 C)  SpO2: 99%    Physical Exam Vitals signs and nursing note reviewed.  Constitutional:      Appearance: Normal appearance.  HENT:     Head: Normocephalic and atraumatic.  Eyes:     Extraocular  Movements: Extraocular movements intact.  Neck:     Musculoskeletal: Normal range of motion.  Cardiovascular:     Rate and Rhythm: Normal rate.     Pulses: Normal pulses.  Pulmonary:     Effort: Pulmonary effort is normal. No respiratory distress.  Abdominal:     General: Abdomen is flat. There is no distension.     Tenderness: There is no abdominal tenderness.  Skin:    General: Skin is warm.  Neurological:     General: No focal deficit present.     Mental Status: She is alert and oriented to person, place, and time. Mental status is at baseline.  Psychiatric:        Mood and Affect: Mood normal.        Behavior: Behavior normal.        Thought Content: Thought content normal.     Assessment & Plan:  Malignant neoplasm of upper-outer quadrant of right breast in female, estrogen receptor positive ([place])  Plan for bilateral breast reduction mastopexy for symmetry after right breast partial mastectomy for breast cancer.  Prescription sent to pharmacy.  Patient is aware that we cannot promise any certain size.  We will do our best to get her is close to her desired C cup as possible.  I explained the limitations with the surgery and the reduction.  She seems to be aware.  She understands as well there will be some change in the size due to the radiation that will come following her chemotherapy.  The risks that can be encountered with and after placement of a breast expander placement were discussed and include the following but not limited to these: bleeding, infection, delayed healing, anesthesia risks, skin sensation changes, injury to structures including nerves, blood vessels, and muscles which may be temporary or permanent, allergies to tape, suture materials and glues, blood products, topical preparations or injected agents, skin contour irregularities, skin discoloration and swelling, deep vein thrombosis, cardiac and pulmonary complications, pain, which may persist, fluid  accumulation, wrinkling of the skin over the expander, changes in nipple or breast sensation, expander leakage or rupture, faulty position of the expander, persistent pain, formation of tight scar tissue around the expander (capsular contracture), possible need for revisional surgery or staged procedures.    [place], DO

## 2018-11-15 NOTE — Progress Notes (Signed)
Patient ID: Kaylee Romero, female    DOB: August 05, 1980, 38 y.o.   MRN: [id number]   Chief Complaint  Patient presents with  . Pre-op Exam    (B) breast reduction  . Breast Cancer    The patient is a 38 year old white female here for her history and physical for breast surgery.  She is scheduled to undergo a partial right mastectomy with breast reduction and mastopexy bilaterally next week.  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  It is estrogen and progesterone positive and HER-2 negative.  The lesion was biopsied at the 12 o'clock position of the right breast.  Her preoperative bra size is a 36 D/DD.  She would like to be around a C cup.  She had a changing skin lesion of her right chest.  This was biopsied at the dermatology office last week and she is waiting on the results.  She is 5 feet 4 inches tall and weighs 179 pounds.  She has an 87-monthold daughter at home.  She has decided on a partial mastectomy of the right with bilateral breast reduction /mastopexy for symmetry. She is planning on starting chemo and radiation after the surgery possibly in January.    Review of Systems  Constitutional: Negative.  Negative for activity change and appetite change.  HENT: Negative.   Eyes: Negative.   Respiratory: Negative.   Cardiovascular: Negative.   Gastrointestinal: Negative.   Endocrine: Negative.   Genitourinary: Negative.   Musculoskeletal: Negative.   Neurological: Negative.   Hematological: Negative.   Psychiatric/Behavioral: Negative.     Past Medical History:  Diagnosis Date  . Anxiety   . Family history of breast cancer   . HSV (herpes simplex virus) anogenital infection    positive titer only    Past Surgical History:  Procedure Laterality Date  . TONSILLECTOMY  1987  . TONSILLECTOMY    . WISDOM TOOTH EXTRACTION        Current Outpatient Medications:  .  amphetamine-dextroamphetamine (ADDERALL XR) 20 MG 24 hr  capsule, Take 20 mg by mouth daily., Disp: , Rfl:  .  Ascorbic Acid (VITAMIN C) 1000 MG tablet, Take 1,000 mg by mouth daily., Disp: , Rfl:  .  citalopram (CELEXA) 20 MG tablet, Take 20 mg by mouth at bedtime. , Disp: , Rfl:  .  clobetasol cream (TEMOVATE) 06.22%, Apply 1 application topically daily., Disp: , Rfl:  .  hydrOXYzine (ATARAX/VISTARIL) 10 MG tablet, Take 10 mg by mouth at bedtime. , Disp: , Rfl:  .  ibuprofen (ADVIL) 200 MG tablet, Take 400 mg by mouth every 6 (six) hours as needed for headache or moderate pain., Disp: , Rfl:  .  ketoconazole (NIZORAL) 2 % cream, Apply 1 application topically 2 (two) times daily., Disp: , Rfl:  .  Multiple Vitamin (MULTIVITAMIN WITH MINERALS) TABS tablet, Take 1 tablet by mouth daily., Disp: , Rfl:  .  tamoxifen (NOLVADEX) 20 MG tablet, Take 20 mg by mouth at bedtime., Disp: , Rfl:  .  Tetrahydrozoline HCl (VISINE OP), Place 1 drop into both eyes daily as needed (redness)., Disp: , Rfl:  .  vitamin B-12 (CYANOCOBALAMIN) 1000 MCG tablet, Take 1,000 mcg by mouth daily., Disp: , Rfl:  .  cephALEXin (KEFLEX) 500 MG capsule, Take 1 capsule (500 mg total) by mouth 4 (four) times daily for 5 days., Disp: 20 capsule, Rfl: 0 .  diazepam (VALIUM) 2 MG tablet, Take 1 tablet (2  mg total) by mouth every 12 (twelve) hours as needed for muscle spasms., Disp: 30 tablet, Rfl: 0 .  HYDROcodone-acetaminophen (NORCO) 5-325 MG tablet, Take 1 tablet by mouth every 6 (six) hours as needed for moderate pain., Disp: 30 tablet, Rfl: 0 .  ondansetron (ZOFRAN) 4 MG tablet, Take 1 tablet (4 mg total) by mouth every 8 (eight) hours as needed for up to 5 days., Disp: 15 tablet, Rfl: 0   Objective:   Vitals:   11/15/18 1523  BP: 134/84  Pulse: 93  Temp: (!) 97.3 F (36.3 C)  SpO2: 99%    Physical Exam Vitals signs and nursing note reviewed.  Constitutional:      Appearance: Normal appearance.  HENT:     Head: Normocephalic and atraumatic.  Eyes:     Extraocular  Movements: Extraocular movements intact.  Neck:     Musculoskeletal: Normal range of motion.  Cardiovascular:     Rate and Rhythm: Normal rate.     Pulses: Normal pulses.  Pulmonary:     Effort: Pulmonary effort is normal. No respiratory distress.  Abdominal:     General: Abdomen is flat. There is no distension.     Tenderness: There is no abdominal tenderness.  Skin:    General: Skin is warm.  Neurological:     General: No focal deficit present.     Mental Status: She is alert and oriented to person, place, and time. Mental status is at baseline.  Psychiatric:        Mood and Affect: Mood normal.        Behavior: Behavior normal.        Thought Content: Thought content normal.     Assessment & Plan:  Malignant neoplasm of upper-outer quadrant of right breast in female, estrogen receptor positive ([place])  Plan for bilateral breast reduction mastopexy for symmetry after right breast partial mastectomy for breast cancer.  Prescription sent to pharmacy.  Patient is aware that we cannot promise any certain size.  We will do our best to get her is close to her desired C cup as possible.  I explained the limitations with the surgery and the reduction.  She seems to be aware.  She understands as well there will be some change in the size due to the radiation that will come following her chemotherapy.  The risks that can be encountered with and after placement of a breast expander placement were discussed and include the following but not limited to these: bleeding, infection, delayed healing, anesthesia risks, skin sensation changes, injury to structures including nerves, blood vessels, and muscles which may be temporary or permanent, allergies to tape, suture materials and glues, blood products, topical preparations or injected agents, skin contour irregularities, skin discoloration and swelling, deep vein thrombosis, cardiac and pulmonary complications, pain, which may persist, fluid  accumulation, wrinkling of the skin over the expander, changes in nipple or breast sensation, expander leakage or rupture, faulty position of the expander, persistent pain, formation of tight scar tissue around the expander (capsular contracture), possible need for revisional surgery or staged procedures.    [place], DO

## 2018-11-16 ENCOUNTER — Encounter (HOSPITAL_COMMUNITY)
Admission: RE | Admit: 2018-11-16 | Discharge: 2018-11-16 | Disposition: A | Discharge status: Home / Self Care | Payer: BC Managed Care – PPO | Source: Ambulatory Visit | Attending: General Surgery | Admitting: General Surgery

## 2018-11-16 ENCOUNTER — Encounter (HOSPITAL_COMMUNITY): Payer: Self-pay | Admitting: *Deleted

## 2018-11-16 ENCOUNTER — Other Ambulatory Visit: Payer: Self-pay

## 2018-11-16 DIAGNOSIS — Z01812 Encounter for preprocedural laboratory examination: Secondary | ICD-10-CM | POA: Diagnosis not present

## 2018-11-16 LAB — BASIC METABOLIC PANEL
Anion gap: 9 (ref 5–15)
BUN: 12 mg/dL (ref 6–20)
CO2: 26 mmol/L (ref 22–32)
Calcium: 9.3 mg/dL (ref 8.9–10.3)
Chloride: 104 mmol/L (ref 98–111)
Creatinine, Ser: 0.66 mg/dL (ref 0.44–1.00)
GFR calc Af Amer: >60 mL/min (ref >60)
GFR calc non Af Amer: >60 mL/min (ref >60)
Glucose, Bld: 107 mg/dL — ABNORMAL HIGH (ref 70–99)
Potassium: 3.6 mmol/L (ref 3.5–5.1)
Sodium: 139 mmol/L (ref 135–145)

## 2018-11-16 LAB — CBC
HCT: 40.5 % (ref 36.0–46.0)
Hemoglobin: 13.9 g/dL (ref 12.0–15.0)
MCH: 32.5 pg (ref 26.0–34.0)
MCHC: 34.3 g/dL (ref 30.0–36.0)
MCV: 94.6 fL (ref 80.0–100.0)
Platelets: 283 10*3/uL (ref 150–400)
RBC: 4.28 MIL/uL (ref 3.87–5.11)
RDW: 11.6 % (ref 11.5–15.5)
WBC: 10 10*3/uL (ref 4.0–10.5)
nRBC: 0 % (ref 0.0–0.2)

## 2018-11-16 NOTE — Progress Notes (Signed)
[id number], [address] [place] [address] Phone: [phone] Fax: [phone]  [id number] - [place], [place] [address] Phone: [phone] Fax: [phone]      Your procedure is scheduled on November 16  Report to [facility] Main Entrance "A" at 0900 A.M., and check in at the Admitting office.  Call this number if you have problems the morning of surgery:  [phone]  Call [phone] if you have any questions prior to your surgery date Monday-Friday 8am-4pm    Remember:  Do not eat after midnight the night before your surgery  You may drink clear liquids until 0800 am the morning of your surgery.   Clear liquids allowed are: Water, Non-Citrus Juices (without pulp), Carbonated Beverages, Clear Tea, Black Coffee Only, and Gatorade    Take these medicines the morning of surgery with A SIP OF WATER  cephALEXin (KEFLEX)  If still taking diazepam (VALIUM) if needed HYDROcodone-acetaminophen (NORCO if needed Eye drops if needed  7 days prior to surgery STOP taking any Aspirin (unless otherwise instructed by your surgeon), Aleve, Naproxen, Ibuprofen, Motrin, Advil, Goody's, BC's, all herbal medications, fish oil, and all vitamins.    The Morning of Surgery  Do not wear jewelry, make-up or nail polish.  Do not wear lotions, powders, or perfumes/colognes, or deodorant  Do not shave 48 hours prior to surgery.    Do not bring valuables to the hospital.  [facility] is not responsible for any belongings or valuables.  If you are a smoker, DO NOT Smoke 24 hours prior to surgery IF you wear a CPAP at night please bring your mask, tubing, and machine the morning of surgery   Remember that you must have someone to transport you home after your surgery, and remain with you for 24 hours if you are discharged the same day.   Contacts, glasses,  hearing aids, dentures or bridgework may not be worn into surgery.    Leave your suitcase in the car.  After surgery it may be brought to your room.  For patients admitted to the hospital, discharge time will be determined by your treatment team.  Patients discharged the day of surgery will not be allowed to drive home.    Special instructions:   [facility]- Preparing For Surgery  Before surgery, you can play an important role. Because skin is not sterile, your skin needs to be as free of germs as possible. You can reduce the number of germs on your skin by washing with CHG (chlorahexidine gluconate) Soap before surgery.  CHG is an antiseptic cleaner which kills germs and bonds with the skin to continue killing germs even after washing.    Oral Hygiene is also important to reduce your risk of infection.  Remember - BRUSH YOUR TEETH THE MORNING OF SURGERY WITH YOUR REGULAR TOOTHPASTE  Please do not use if you have an allergy to CHG or antibacterial soaps. If your skin becomes reddened/irritated stop using the CHG.  Do not shave (including legs and underarms) for at least 48 hours prior to first CHG shower. It is OK to shave your face.  Please follow these instructions carefully.   1. Shower the NIGHT BEFORE SURGERY and the MORNING OF SURGERY with CHG Soap.   2. If you chose to wash your hair, wash your hair first as usual with your normal shampoo.  3. After you shampoo,  rinse your hair and body thoroughly to remove the shampoo.  4. Use CHG as you would any other liquid soap. You can apply CHG directly to the skin and wash gently with a scrungie or a clean washcloth.   5. Apply the CHG Soap to your body ONLY FROM THE NECK DOWN.  Do not use on open wounds or open sores. Avoid contact with your eyes, ears, mouth and genitals (private parts). Wash Face and genitals (private parts)  with your normal soap.   6. Wash thoroughly, paying special attention to the area where your surgery will be  performed.  7. Thoroughly rinse your body with warm water from the neck down.  8. DO NOT shower/wash with your normal soap after using and rinsing off the CHG Soap.  9. Pat yourself dry with a CLEAN TOWEL.  10. Wear CLEAN PAJAMAS to bed the night before surgery, wear comfortable clothes the morning of surgery  11. Place CLEAN SHEETS on your bed the night of your first shower and DO NOT SLEEP WITH PETS.    Day of Surgery:  Do not apply any deodorants/lotions. Please shower the morning of surgery with the CHG soap  Please wear clean clothes to the hospital/surgery center.   Remember to brush your teeth WITH YOUR REGULAR TOOTHPASTE.   Please read over the following fact sheets that you were given.

## 2018-11-16 NOTE — Progress Notes (Signed)
PCP - Linda Hedges Cardiologist - denies  Chest x-ray - n/a EKG - n/a Stress Test - denies ECHO - 10/2018 Cardiac Cath - denies  ERAS Protcol - yes PRE-SURGERY Ensure or G2- not ordered  No seed to be placed will need urine HCG DOS  COVID TEST- 11/12   Anesthesia review: NO  Patient denies shortness of breath, fever, cough and chest pain at PAT appointment   All instructions explained to the patient, with a verbal understanding of the material. Patient agrees to go over the instructions while at home for a better understanding. Patient also instructed to self quarantine after being tested for COVID-19. The opportunity to ask questions was provided.

## 2018-11-17 ENCOUNTER — Telehealth: Payer: Self-pay

## 2018-11-17 ENCOUNTER — Telehealth: Payer: Self-pay | Admitting: Oncology

## 2018-11-17 ENCOUNTER — Other Ambulatory Visit
Admission: RE | Admit: 2018-11-17 | Discharge: 2018-11-17 | Disposition: A | Discharge status: Home / Self Care | Payer: BC Managed Care – PPO | Source: Ambulatory Visit | Attending: General Surgery | Admitting: General Surgery

## 2018-11-17 ENCOUNTER — Other Ambulatory Visit (HOSPITAL_COMMUNITY): Payer: BC Managed Care – PPO

## 2018-11-17 DIAGNOSIS — Z01812 Encounter for preprocedural laboratory examination: Secondary | ICD-10-CM | POA: Diagnosis not present

## 2018-11-17 DIAGNOSIS — Z20828 Contact with and (suspected) exposure to other viral communicable diseases: Secondary | ICD-10-CM | POA: Insufficient documentation

## 2018-11-17 NOTE — Telephone Encounter (Signed)
Returned patient's phone call regarding rescheduling an appointment, informed patient time requested is not available. Patient will keep appointment as is. °

## 2018-11-17 NOTE — Telephone Encounter (Signed)
VM received from pt requesting to reschedule her appt on 11/30 with Dr Jana Hakim.   In basket msg sent to scheduling dept to call pt to get her rescheduled.

## 2018-11-17 NOTE — Telephone Encounter (Signed)
Called pt per 11/12 sch message/ no answer. Left message for patient to call back to reschedule appt.

## 2018-11-18 LAB — SARS CORONAVIRUS 2 (TAT 6-24 HRS): SARS Coronavirus 2: NEGATIVE

## 2018-11-21 ENCOUNTER — Ambulatory Visit (HOSPITAL_COMMUNITY): Payer: BC Managed Care – PPO | Admitting: Certified Registered Nurse Anesthetist

## 2018-11-21 ENCOUNTER — Encounter (HOSPITAL_COMMUNITY): Payer: Self-pay | Admitting: Certified Registered Nurse Anesthetist

## 2018-11-21 ENCOUNTER — Ambulatory Visit (HOSPITAL_COMMUNITY): Payer: BC Managed Care – PPO

## 2018-11-21 ENCOUNTER — Other Ambulatory Visit: Payer: Self-pay

## 2018-11-21 ENCOUNTER — Encounter (HOSPITAL_COMMUNITY)
Admission: RE | Disposition: A | Discharge status: Home / Self Care | Payer: Self-pay | Source: Home / Self Care | Attending: General Surgery

## 2018-11-21 ENCOUNTER — Ambulatory Visit (HOSPITAL_COMMUNITY)
Admission: RE | Admit: 2018-11-21 | Discharge: 2018-11-21 | Disposition: A | Discharge status: Home / Self Care | Payer: BC Managed Care – PPO | Attending: General Surgery | Admitting: General Surgery

## 2018-11-21 ENCOUNTER — Encounter (HOSPITAL_COMMUNITY)
Admission: RE | Admit: 2018-11-21 | Discharge: 2018-11-21 | Disposition: A | Discharge status: Home / Self Care | Payer: BC Managed Care – PPO | Source: Ambulatory Visit | Attending: General Surgery | Admitting: General Surgery

## 2018-11-21 DIAGNOSIS — C773 Secondary and unspecified malignant neoplasm of axilla and upper limb lymph nodes: Secondary | ICD-10-CM | POA: Diagnosis not present

## 2018-11-21 DIAGNOSIS — Z803 Family history of malignant neoplasm of breast: Secondary | ICD-10-CM | POA: Diagnosis not present

## 2018-11-21 DIAGNOSIS — N6489 Other specified disorders of breast: Secondary | ICD-10-CM | POA: Insufficient documentation

## 2018-11-21 DIAGNOSIS — Z419 Encounter for procedure for purposes other than remedying health state, unspecified: Secondary | ICD-10-CM

## 2018-11-21 DIAGNOSIS — Z170 Estrogen receptor positive status [ER+]: Secondary | ICD-10-CM | POA: Insufficient documentation

## 2018-11-21 DIAGNOSIS — N62 Hypertrophy of breast: Secondary | ICD-10-CM | POA: Insufficient documentation

## 2018-11-21 DIAGNOSIS — Z87891 Personal history of nicotine dependence: Secondary | ICD-10-CM | POA: Diagnosis not present

## 2018-11-21 DIAGNOSIS — F419 Anxiety disorder, unspecified: Secondary | ICD-10-CM | POA: Diagnosis not present

## 2018-11-21 DIAGNOSIS — Z79899 Other long term (current) drug therapy: Secondary | ICD-10-CM | POA: Insufficient documentation

## 2018-11-21 DIAGNOSIS — I10 Essential (primary) hypertension: Secondary | ICD-10-CM | POA: Diagnosis not present

## 2018-11-21 DIAGNOSIS — C50411 Malignant neoplasm of upper-outer quadrant of right female breast: Secondary | ICD-10-CM | POA: Diagnosis not present

## 2018-11-21 DIAGNOSIS — Z95828 Presence of other vascular implants and grafts: Secondary | ICD-10-CM

## 2018-11-21 HISTORY — PX: PORTACATH PLACEMENT: SHX2246

## 2018-11-21 HISTORY — PX: BREAST REDUCTION SURGERY: SHX8

## 2018-11-21 HISTORY — PX: BREAST LUMPECTOMY WITH AXILLARY LYMPH NODE BIOPSY: SHX5593

## 2018-11-21 IMAGING — DX DG CHEST 1V PORT
1 series · 1 of 1 positions shown · non-contrast
Comparison: None.

CLINICAL DATA: Status post Port-A-Cath insertion.

EXAM:
PORTABLE CHEST 1 VIEW

[chest]
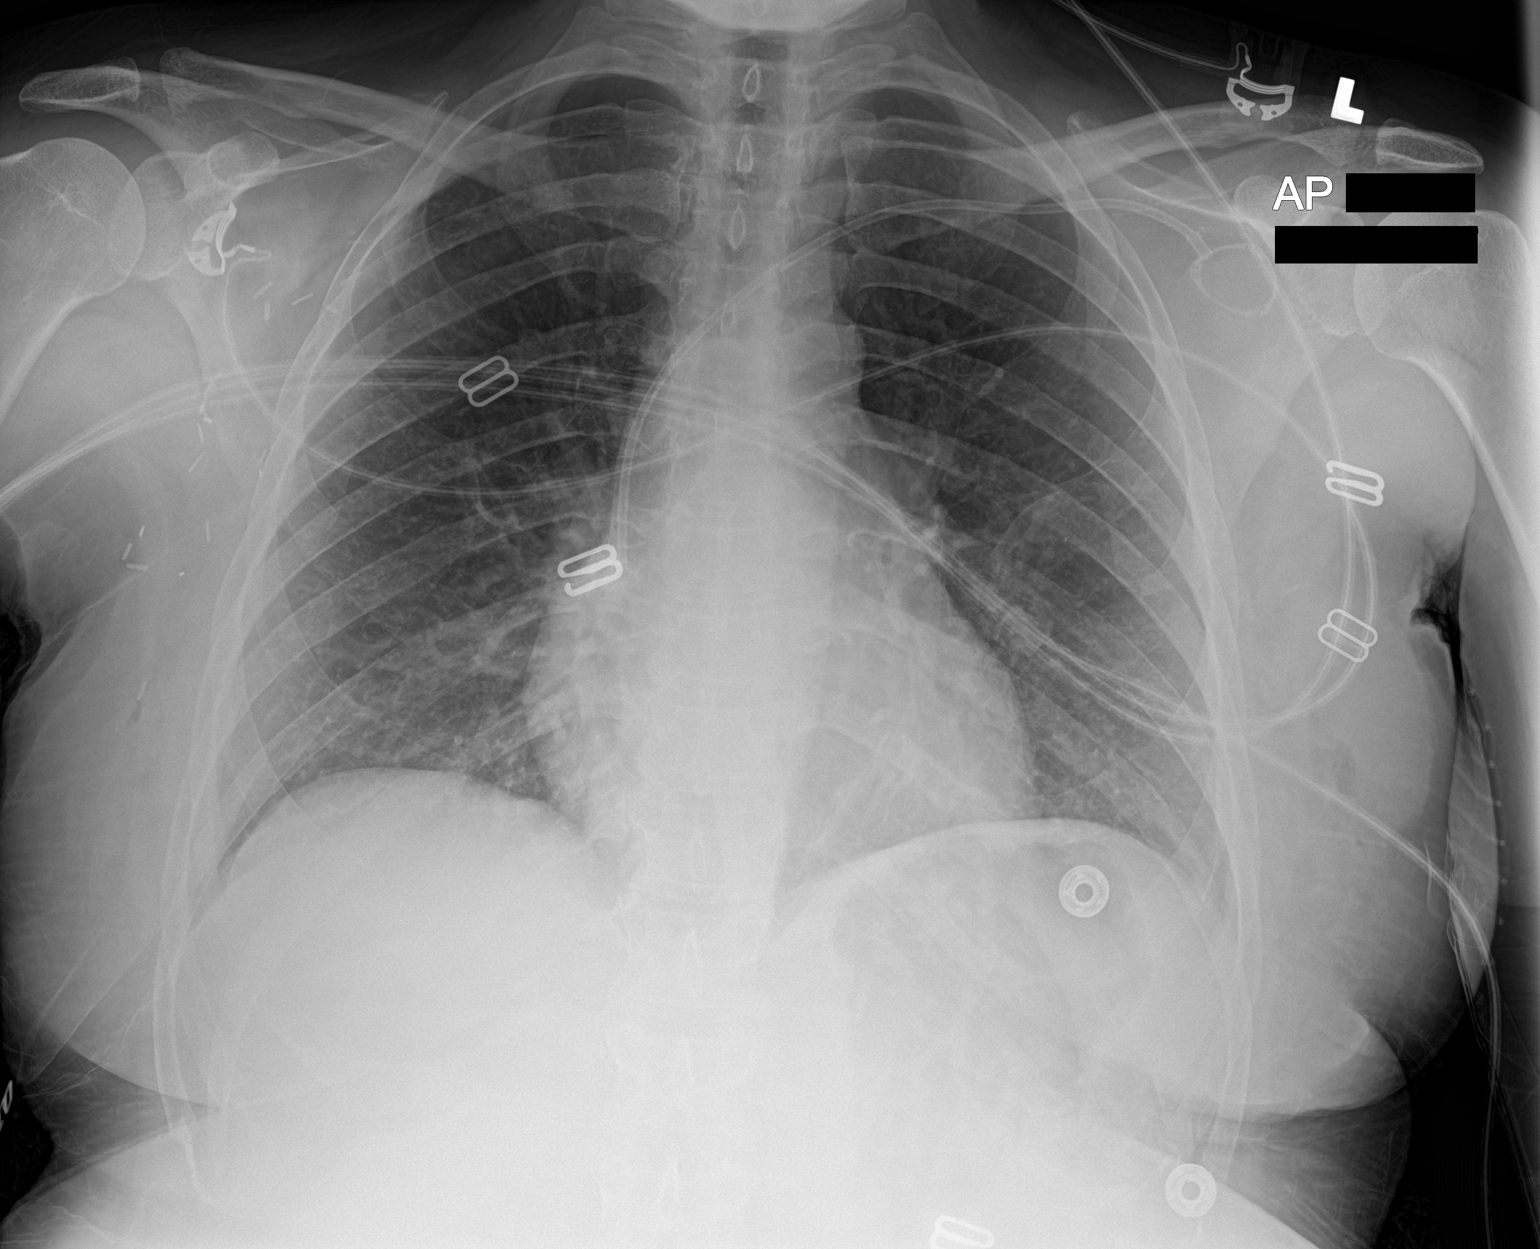

[1 of 1 positions shown; findings below may reference images not displayed]

FINDINGS: Power port has been inserted. The tip is in the superior vena cava
3.7 cm below the carina in good position. No pneumothorax. Heart
size and vascularity are normal. Lungs are clear. No acute bone
abnormality. Multiple surgical clips in the right axilla.
IMPRESSION: Satisfactory appearance of the chest after Port-A-Cath insertion. No
pneumothorax. No acute abnormalities.

## 2018-11-21 SURGERY — BREAST LUMPECTOMY WITH AXILLARY LYMPH NODE BIOPSY
Anesthesia: General | Site: Chest | Laterality: Right

## 2018-11-21 MED ORDER — HEPARIN SOD (PORK) LOCK FLUSH 100 UNIT/ML IV SOLN
INTRAVENOUS | Status: AC
  Filled 2018-11-21: qty 5

## 2018-11-21 MED ORDER — CEFAZOLIN SODIUM-DEXTROSE 2-4 GM/100ML-% IV SOLN: 2.0000 g | INTRAVENOUS | Status: DC

## 2018-11-21 MED ORDER — LIDOCAINE-EPINEPHRINE 1 %-1:100000 IJ SOLN
INTRAMUSCULAR | Status: AC
  Filled 2018-11-21: qty 2

## 2018-11-21 MED ORDER — LIDOCAINE-EPINEPHRINE 1 %-1:100000 IJ SOLN
INTRAMUSCULAR | Status: DC | PRN
  Administered 2018-11-21: 20 mL

## 2018-11-21 MED ORDER — FENTANYL CITRATE (PF) 100 MCG/2ML IJ SOLN
25.0000 ug | INTRAMUSCULAR | Status: DC | PRN
  Administered 2018-11-21: 50 ug via INTRAVENOUS

## 2018-11-21 MED ORDER — GABAPENTIN 300 MG PO CAPS
300.0000 mg | ORAL_CAPSULE | ORAL | Status: AC
  Administered 2018-11-21: 300 mg via ORAL

## 2018-11-21 MED ORDER — PHENYLEPHRINE HCL-NACL 10-0.9 MG/250ML-% IV SOLN
INTRAVENOUS | Status: DC | PRN
  Administered 2018-11-21: 20 ug/min via INTRAVENOUS

## 2018-11-21 MED ORDER — OXYCODONE HCL 5 MG PO TABS
ORAL_TABLET | ORAL | Status: AC
  Filled 2018-11-21: qty 1

## 2018-11-21 MED ORDER — CHLORHEXIDINE GLUCONATE CLOTH 2 % EX PADS: 6.0000 | MEDICATED_PAD | Freq: Once | CUTANEOUS | Status: DC

## 2018-11-21 MED ORDER — FENTANYL CITRATE (PF) 250 MCG/5ML IJ SOLN
INTRAMUSCULAR | Status: DC | PRN
  Administered 2018-11-21: 100 ug via INTRAVENOUS
  Administered 2018-11-21 (×4): 50 ug via INTRAVENOUS

## 2018-11-21 MED ORDER — SUGAMMADEX SODIUM 200 MG/2ML IV SOLN
INTRAVENOUS | Status: DC | PRN
  Administered 2018-11-21: 175 mg via INTRAVENOUS

## 2018-11-21 MED ORDER — TECHNETIUM TC 99M SULFUR COLLOID FILTERED
1.0000 | Freq: Once | INTRAVENOUS | Status: AC | PRN
  Administered 2018-11-21: 1 via INTRADERMAL

## 2018-11-21 MED ORDER — CEFAZOLIN SODIUM-DEXTROSE 2-4 GM/100ML-% IV SOLN
2.0000 g | INTRAVENOUS | Status: AC
  Administered 2018-11-21: 2 g via INTRAVENOUS

## 2018-11-21 MED ORDER — ONDANSETRON HCL 4 MG/2ML IJ SOLN
INTRAMUSCULAR | Status: DC | PRN
  Administered 2018-11-21: 4 mg via INTRAVENOUS

## 2018-11-21 MED ORDER — MIDAZOLAM HCL 2 MG/2ML IJ SOLN
INTRAMUSCULAR | Status: AC
  Filled 2018-11-21: qty 2

## 2018-11-21 MED ORDER — SODIUM CHLORIDE (PF) 0.9 % IJ SOLN
INTRAMUSCULAR | Status: AC
  Filled 2018-11-21: qty 10

## 2018-11-21 MED ORDER — ONDANSETRON HCL 4 MG/2ML IJ SOLN
INTRAMUSCULAR | Status: AC
  Filled 2018-11-21: qty 2

## 2018-11-21 MED ORDER — OXYCODONE HCL 5 MG PO TABS: 5.0000 mg | ORAL_TABLET | Freq: Once | ORAL | Status: DC | PRN

## 2018-11-21 MED ORDER — BUPIVACAINE HCL (PF) 0.25 % IJ SOLN
INTRAMUSCULAR | Status: DC | PRN
  Administered 2018-11-21: 12 mL

## 2018-11-21 MED ORDER — OXYCODONE HCL 5 MG/5ML PO SOLN: 5.0000 mg | Freq: Once | ORAL | Status: DC | PRN

## 2018-11-21 MED ORDER — FENTANYL CITRATE (PF) 100 MCG/2ML IJ SOLN
INTRAMUSCULAR | Status: AC
  Filled 2018-11-21: qty 2

## 2018-11-21 MED ORDER — LIDOCAINE 2% (20 MG/ML) 5 ML SYRINGE
INTRAMUSCULAR | Status: AC
  Filled 2018-11-21: qty 5

## 2018-11-21 MED ORDER — 0.9 % SODIUM CHLORIDE (POUR BTL) OPTIME
TOPICAL | Status: DC | PRN
  Administered 2018-11-21 (×2): 1000 mL

## 2018-11-21 MED ORDER — LIDOCAINE 2% (20 MG/ML) 5 ML SYRINGE
INTRAMUSCULAR | Status: DC | PRN
  Administered 2018-11-21: 60 mg via INTRAVENOUS

## 2018-11-21 MED ORDER — ROCURONIUM BROMIDE 10 MG/ML (PF) SYRINGE
PREFILLED_SYRINGE | INTRAVENOUS | Status: AC
  Filled 2018-11-21: qty 10

## 2018-11-21 MED ORDER — FENTANYL CITRATE (PF) 250 MCG/5ML IJ SOLN
INTRAMUSCULAR | Status: AC
  Filled 2018-11-21: qty 5

## 2018-11-21 MED ORDER — CELECOXIB 200 MG PO CAPS
200.0000 mg | ORAL_CAPSULE | ORAL | Status: AC
  Administered 2018-11-21: 200 mg via ORAL

## 2018-11-21 MED ORDER — FENTANYL CITRATE (PF) 100 MCG/2ML IJ SOLN
50.0000 ug | Freq: Once | INTRAMUSCULAR | Status: AC
  Administered 2018-11-21: 50 ug via INTRAVENOUS

## 2018-11-21 MED ORDER — LACTATED RINGERS IV SOLN
INTRAVENOUS | Status: DC | PRN
  Administered 2018-11-21 (×2): via INTRAVENOUS

## 2018-11-21 MED ORDER — ONDANSETRON HCL 4 MG/2ML IJ SOLN: 4.0000 mg | Freq: Four times a day (QID) | INTRAMUSCULAR | Status: DC | PRN

## 2018-11-21 MED ORDER — BUPIVACAINE HCL (PF) 0.25 % IJ SOLN
INTRAMUSCULAR | Status: AC
  Filled 2018-11-21: qty 60

## 2018-11-21 MED ORDER — MIDAZOLAM HCL 5 MG/5ML IJ SOLN
INTRAMUSCULAR | Status: DC | PRN
  Administered 2018-11-21: 2 mg via INTRAVENOUS

## 2018-11-21 MED ORDER — ACETAMINOPHEN 500 MG PO TABS
1000.0000 mg | ORAL_TABLET | ORAL | Status: AC
  Administered 2018-11-21: 1000 mg via ORAL

## 2018-11-21 MED ORDER — PHENYLEPHRINE 40 MCG/ML (10ML) SYRINGE FOR IV PUSH (FOR BLOOD PRESSURE SUPPORT)
PREFILLED_SYRINGE | INTRAVENOUS | Status: DC | PRN
  Administered 2018-11-21: 80 ug via INTRAVENOUS

## 2018-11-21 MED ORDER — ROCURONIUM BROMIDE 10 MG/ML (PF) SYRINGE
PREFILLED_SYRINGE | INTRAVENOUS | Status: DC | PRN
  Administered 2018-11-21: 20 mg via INTRAVENOUS
  Administered 2018-11-21: 60 mg via INTRAVENOUS
  Administered 2018-11-21 (×2): 20 mg via INTRAVENOUS

## 2018-11-21 MED ORDER — SODIUM CHLORIDE 0.9 % IV SOLN
INTRAVENOUS | Status: AC
  Filled 2018-11-21: qty 1.2

## 2018-11-21 MED ORDER — SODIUM CHLORIDE 0.9 % IV SOLN
INTRAVENOUS | Status: DC | PRN
  Administered 2018-11-21: 500 mL

## 2018-11-21 MED ORDER — METHYLENE BLUE 0.5 % INJ SOLN
INTRAVENOUS | Status: AC
  Filled 2018-11-21: qty 10

## 2018-11-21 MED ORDER — CEFAZOLIN SODIUM-DEXTROSE 2-4 GM/100ML-% IV SOLN
INTRAVENOUS | Status: AC
  Filled 2018-11-21: qty 100

## 2018-11-21 MED ORDER — HEPARIN SOD (PORK) LOCK FLUSH 100 UNIT/ML IV SOLN
INTRAVENOUS | Status: DC | PRN
  Administered 2018-11-21: 500 [IU]

## 2018-11-21 MED ORDER — SODIUM CHLORIDE 0.9 % IV SOLN
INTRAVENOUS | Status: AC
  Filled 2018-11-21 (×2): qty 500000

## 2018-11-21 MED ORDER — PHENYLEPHRINE 40 MCG/ML (10ML) SYRINGE FOR IV PUSH (FOR BLOOD PRESSURE SUPPORT)
PREFILLED_SYRINGE | INTRAVENOUS | Status: AC
  Filled 2018-11-21: qty 10

## 2018-11-21 MED ORDER — MIDAZOLAM HCL 2 MG/2ML IJ SOLN
2.0000 mg | Freq: Once | INTRAMUSCULAR | Status: AC
  Administered 2018-11-21: 2 mg via INTRAVENOUS

## 2018-11-21 MED ORDER — GABAPENTIN 300 MG PO CAPS
ORAL_CAPSULE | ORAL | Status: AC
  Filled 2018-11-21: qty 1

## 2018-11-21 MED ORDER — BUPIVACAINE-EPINEPHRINE (PF) 0.5% -1:200000 IJ SOLN
INTRAMUSCULAR | Status: DC | PRN
  Administered 2018-11-21: 25 mL via PERINEURAL

## 2018-11-21 MED ORDER — ACETAMINOPHEN 500 MG PO TABS
ORAL_TABLET | ORAL | Status: AC
  Filled 2018-11-21: qty 2

## 2018-11-21 MED ORDER — CELECOXIB 200 MG PO CAPS
ORAL_CAPSULE | ORAL | Status: AC
  Filled 2018-11-21: qty 1

## 2018-11-21 MED ORDER — PROPOFOL 10 MG/ML IV BOLUS
INTRAVENOUS | Status: DC | PRN
  Administered 2018-11-21: 10 mg via INTRAVENOUS
  Administered 2018-11-21: 190 mg via INTRAVENOUS

## 2018-11-21 MED ORDER — DEXAMETHASONE SODIUM PHOSPHATE 10 MG/ML IJ SOLN
INTRAMUSCULAR | Status: DC | PRN
  Administered 2018-11-21: 10 mg via INTRAVENOUS

## 2018-11-21 MED ORDER — PROPOFOL 10 MG/ML IV BOLUS
INTRAVENOUS | Status: AC
  Filled 2018-11-21: qty 20

## 2018-11-21 MED ORDER — OXYCODONE HCL 5 MG PO TABS
5.0000 mg | ORAL_TABLET | ORAL | Status: DC | PRN
  Administered 2018-11-21: 5 mg via ORAL

## 2018-11-21 SURGICAL SUPPLY — 89 items
APPLIER CLIP 9.375 MED OPEN (MISCELLANEOUS) ×8
BAG DECANTER FOR FLEXI CONT (MISCELLANEOUS) ×7 IMPLANT
BINDER BREAST LRG (GAUZE/BANDAGES/DRESSINGS) ×3 IMPLANT
BINDER BREAST MEDIUM (GAUZE/BANDAGES/DRESSINGS) IMPLANT
BINDER BREAST XLRG (GAUZE/BANDAGES/DRESSINGS) ×4 IMPLANT
BINDER BREAST XXLRG (GAUZE/BANDAGES/DRESSINGS) IMPLANT
BIOPATCH RED 1 DISK 7.0 (GAUZE/BANDAGES/DRESSINGS) ×6 IMPLANT
BLADE HEX COATED 2.75 (ELECTRODE) ×3 IMPLANT
BLADE SURG 15 STRL LF DISP TIS (BLADE) ×6 IMPLANT
BLADE SURG 15 STRL SS (BLADE) ×1
BNDG GAUZE ELAST 4 BULKY (GAUZE/BANDAGES/DRESSINGS) ×6 IMPLANT
CANISTER SUCT 3000ML PPV (MISCELLANEOUS) ×7 IMPLANT
CHLORAPREP W/TINT 10.5 ML (MISCELLANEOUS) ×3 IMPLANT
CHLORAPREP W/TINT 26 (MISCELLANEOUS) ×8 IMPLANT
CLIP APPLIE 9.375 MED OPEN (MISCELLANEOUS) ×3 IMPLANT
CLSR STERI-STRIP ANTIMIC 1/2X4 (GAUZE/BANDAGES/DRESSINGS) ×2 IMPLANT
CONT SPEC 4OZ CLIKSEAL STRL BL (MISCELLANEOUS) ×7 IMPLANT
COVER PROBE W GEL 5X96 (DRAPES) ×4 IMPLANT
COVER SURGICAL LIGHT HANDLE (MISCELLANEOUS) ×5 IMPLANT
COVER TRANSDUCER ULTRASND GEL (DRAPE) ×1 IMPLANT
COVER WAND RF STERILE (DRAPES) ×6 IMPLANT
DECANTER SPIKE VIAL GLASS SM (MISCELLANEOUS) ×5 IMPLANT
DERMABOND ADVANCED (GAUZE/BANDAGES/DRESSINGS) ×4
DERMABOND ADVANCED .7 DNX12 (GAUZE/BANDAGES/DRESSINGS) ×9 IMPLANT
DRAIN CHANNEL 19F RND (DRAIN) IMPLANT
DRAPE C-ARM 42X120 X-RAY (DRAPES) ×4 IMPLANT
DRAPE CHEST BREAST 15X10 FENES (DRAPES) ×3 IMPLANT
DRAPE LAPAROSCOPIC ABDOMINAL (DRAPES) ×4 IMPLANT
DRAPE ORTHO SPLIT 87X125 STRL (DRAPES) ×2 IMPLANT
DRAPE SURG 17X23 STRL (DRAPES) ×1 IMPLANT
DRSG PAD ABDOMINAL 8X10 ST (GAUZE/BANDAGES/DRESSINGS) ×8 IMPLANT
DRSG TEGADERM 4X4.75 (GAUZE/BANDAGES/DRESSINGS) ×1 IMPLANT
ELECT BLADE 4.0 EZ CLEAN MEGAD (MISCELLANEOUS)
ELECT CAUTERY BLADE 6.4 (BLADE) ×5 IMPLANT
ELECT COATED BLADE 2.86 ST (ELECTRODE) ×5 IMPLANT
ELECT REM PT RETURN 9FT ADLT (ELECTROSURGICAL) ×8
ELECTRODE BLDE 4.0 EZ CLN MEGD (MISCELLANEOUS) ×3 IMPLANT
ELECTRODE REM PT RTRN 9FT ADLT (ELECTROSURGICAL) ×6 IMPLANT
EVACUATOR SILICONE 100CC (DRAIN) IMPLANT
FILTER STRAW FLUID ASPIR (MISCELLANEOUS) IMPLANT
GAUZE SPONGE 4X4 12PLY STRL (GAUZE/BANDAGES/DRESSINGS) ×1 IMPLANT
GEL ULTRASOUND 20GR AQUASONIC (MISCELLANEOUS) IMPLANT
GLOVE BIO SURGEON STRL SZ 6.5 (GLOVE) ×8 IMPLANT
GLOVE BIO SURGEON STRL SZ7.5 (GLOVE) ×5 IMPLANT
GOWN STRL REUS W/ TWL LRG LVL3 (GOWN DISPOSABLE) ×12 IMPLANT
GOWN STRL REUS W/TWL LRG LVL3 (GOWN DISPOSABLE) ×6
KIT BASIN OR (CUSTOM PROCEDURE TRAY) ×5 IMPLANT
KIT MARKER MARGIN INK (KITS) ×1 IMPLANT
KIT PORT POWER 8FR ISP CVUE (Port) ×1 IMPLANT
KIT TURNOVER KIT B (KITS) ×4 IMPLANT
LIGHT WAVEGUIDE WIDE FLAT (MISCELLANEOUS) IMPLANT
NDL 18GX1X1/2 (RX/OR ONLY) (NEEDLE) IMPLANT
NDL HYPO 25GX1X1/2 BEV (NEEDLE) ×3 IMPLANT
NDL SPNL 22GX3.5 QUINCKE BK (NEEDLE) ×3 IMPLANT
NEEDLE 18GX1X1/2 (RX/OR ONLY) (NEEDLE) IMPLANT
NEEDLE 22X1 1/2 (OR ONLY) (NEEDLE) IMPLANT
NEEDLE HYPO 25GX1X1/2 BEV (NEEDLE) ×8 IMPLANT
NEEDLE SPNL 22GX3.5 QUINCKE BK (NEEDLE) IMPLANT
NS IRRIG 1000ML POUR BTL (IV SOLUTION) ×8 IMPLANT
PACK GENERAL/GYN (CUSTOM PROCEDURE TRAY) ×8 IMPLANT
PAD ARMBOARD 7.5X6 YLW CONV (MISCELLANEOUS) ×4 IMPLANT
PENCIL BUTTON HOLSTER BLD 10FT (ELECTRODE) ×4 IMPLANT
POSITIONER HEAD DONUT 9IN (MISCELLANEOUS) ×4 IMPLANT
SHEATH COOK PEEL AWAY SET 9F (SHEATH) IMPLANT
SPECIMEN JAR MEDIUM (MISCELLANEOUS) ×4 IMPLANT
SPONGE LAP 18X18 RF (DISPOSABLE) ×10 IMPLANT
SUT MNCRL AB 3-0 PS2 27 (SUTURE) ×2 IMPLANT
SUT MNCRL AB 4-0 PS2 18 (SUTURE) ×12 IMPLANT
SUT MON AB 5-0 PS2 18 (SUTURE) ×11 IMPLANT
SUT PDS AB 2-0 CT2 27 (SUTURE) IMPLANT
SUT PROLENE 2 0 SH 30 (SUTURE) ×4 IMPLANT
SUT SILK 2 0 SH (SUTURE) IMPLANT
SUT SILK 3 0 PS 1 (SUTURE) IMPLANT
SUT VIC AB 3-0 SH 18 (SUTURE) ×4 IMPLANT
SUT VIC AB 3-0 SH 27 (SUTURE) ×1
SUT VIC AB 3-0 SH 27X BRD (SUTURE) ×6 IMPLANT
SUT VIC AB 3-0 SH 27XBRD (SUTURE) ×3 IMPLANT
SUT VICRYL 4-0 PS2 18IN ABS (SUTURE) ×6 IMPLANT
SYR 10ML LL (SYRINGE) IMPLANT
SYR 20ML ECCENTRIC (SYRINGE) ×2 IMPLANT
SYR 50ML LL SCALE MARK (SYRINGE) IMPLANT
SYR 5ML LUER SLIP (SYRINGE) ×4 IMPLANT
SYR CONTROL 10ML LL (SYRINGE) ×5 IMPLANT
TOWEL GREEN STERILE (TOWEL DISPOSABLE) ×3 IMPLANT
TOWEL GREEN STERILE FF (TOWEL DISPOSABLE) ×11 IMPLANT
TRAY LAPAROSCOPIC MC (CUSTOM PROCEDURE TRAY) ×3 IMPLANT
TUBE CONNECTING 20X1/4 (TUBING) ×3 IMPLANT
UNDERPAD 30X30 (UNDERPADS AND DIAPERS) ×7 IMPLANT
YANKAUER SUCT BULB TIP NO VENT (SUCTIONS) ×3 IMPLANT

## 2018-11-21 NOTE — Op Note (Signed)
Breast Reduction Op note:    DATE OF PROCEDURE: 11/21/2018  LOCATION: [facility] Operating Room  SURGEON: Lyndee Leo Sanger Dillingham, DO  ASSISTANT: Roetta Sessions, PA and Phoebe Sharps, [place]  PREOPERATIVE DIAGNOSIS 1. Right Breast Cancer 2. Breast asymmetry after cancer treatment 3. Macromastia / Back Pain / Neck Pain  POSTOPERATIVE DIAGNOSIS 1. Right Breast Cancer 2. Breast asymmetry after cancer treatment 3. Macromastia / Back Pain / Neck Pain  PROCEDURES 1. Right Oncoplastic breast reduction 121 g 2. Left breast reduction for symmetry [id number] g  COMPLICATIONS: None.  DRAINS: none  INDICATIONS FOR PROCEDURE Kaylee Romero is a 38 y.o. year-old female born on [birth year],with right breast cancer.  She is going to have a partial right mastectomy for treatment of the cancer. We will do an oncoplastic reduction on the right.  The left will be for symmetry and she also complained of macromastia with concominant back pain, neck pain, shoulder grooving from her bra.   MRN: [id number]  CONSENT Informed consent was obtained directly from the patient. The risks, benefits and alternatives were fully discussed. Specific risks including but not limited to bleeding, infection, hematoma, seroma, scarring, pain, nipple necrosis, asymmetry, poor cosmetic results, and need for further surgery were discussed. The patient had ample opportunity to have her questions answered to her satisfaction.  DESCRIPTION OF PROCEDURE  Patient was brought into the operating room and placed in a supine position.  SCDs were placed and appropriate padding was performed.  Antibiotics were given. The patient underwent general anesthesia and the chest was prepped and draped in a sterile fashion.  A timeout was performed and all information was confirmed to be correct.  Right side: The case was started with the general surgeon for the partial mastectomy.  Preoperative markings were confirmed.  Incision lines were  injected with 1% Xylocaine with epinephrine.  After waiting for vasoconstriction, the marked lines were incised.  A Wise-pattern superomedial breast reduction was performed by de-epithelializing the pedicle, using bovie to create the superomedial pedicle, and removing breast tissue inferior portion of the breast. The superior portion was removed as the partial mastectomy by general surgery.  Care was taken to not undermine the breast pedicle. Hemostasis was achieved.  The nipple was gently rotated into position and the soft tissue closed with 4-0 Monocryl.   Hemostasis confirmed.  The deep tissues were approximated with 3-0 monocryl sutures and the skin was closed with deep dermal and subcuticular 4-0 Monocryl sutures.  The nipple and skin flaps had good capillary refill at the end of the procedure.    Left side: Preoperative markings were confirmed.  Incision lines were injected with 1% Xylocaine with epinephrine.  After waiting for vasoconstriction, the marked lines were incised.  A Wise-pattern superomedial breast reduction was performed by de-epithelializing the pedicle, using bovie to create the superomedial pedicle, and removing breast tissue from the superior, lateral, and inferior portions of the breast.  Care was taken to not undermine the breast pedicle. Hemostasis was achieved.  The nipple was gently rotated into position and the soft tissue was closed with 4-0 Monocryl.  The patient was sat upright and size and shape symmetry was confirmed.  The pocket was irrigated and hemostasis confirmed.  The deep tissues were approximated with 3-0 monocryl sutures and the skin was closed with deep dermal and subcuticular 4-0 Monocryl sutures.  Dermabond was applied.  A breast binder and ABDs were placed.  The nipple and skin flaps had good capillary refill at the end of  the procedure.  The patient tolerated the procedure well. The patient was allowed to wake from anesthesia and taken to the recovery room in  satisfactory condition  The advanced practice practitioner (APP) assisted throughout the case.  The APP was essential in retraction and counter traction when needed to make the case progress smoothly.  This retraction and assistance made it possible to see the tissue plans for the procedure.  The assistance was needed for blood control, tissue re-approximation and assisted with closure of the incision site.

## 2018-11-21 NOTE — Anesthesia Procedure Notes (Signed)
Anesthesia Regional Block: Pectoralis block   Pre-Anesthetic Checklist: ,, timeout performed, Correct Patient, Correct Site, Correct Laterality, Correct Procedure, Correct Position, site marked, Risks and benefits discussed,  Surgical consent,  Pre-op evaluation,  At surgeon's request and post-op pain management  Laterality: Right  Prep: chloraprep       Needles:  Injection technique: Single-shot  Needle Type: Echogenic Needle     Needle Length: 9cm  Needle Gauge: 21     Additional Needles:   Narrative:  Start time: 11/21/2018 10:23 AM End time: 11/21/2018 10:29 AM Injection made incrementally with aspirations every 5 mL.  Performed by: Personally  Anesthesiologist: Albertha Ghee, MD  Additional Notes: Pt tolerated the procedure well.

## 2018-11-21 NOTE — Anesthesia Preprocedure Evaluation (Addendum)
Anesthesia Evaluation  Patient identified by MRN, date of birth, ID band Patient awake    Reviewed: Allergy & Precautions, H&P , NPO status , Patient's Chart, lab work & pertinent test results  Airway Mallampati: II  TM Distance: >3 FB Neck ROM: full    Dental  (+) Teeth Intact, Dental Advisory Given   Pulmonary former smoker,    breath sounds clear to auscultation       Cardiovascular hypertension,  Rhythm:regular Rate:Normal     Neuro/Psych PSYCHIATRIC DISORDERS Anxiety    GI/Hepatic   Endo/Other    Renal/GU      Musculoskeletal   Abdominal   Peds  Hematology   Anesthesia Other Findings   Reproductive/Obstetrics Breast CA                            Anesthesia Physical Anesthesia Plan  ASA: II  Anesthesia Plan: General   Post-op Pain Management: GA combined w/ Regional for post-op pain   Induction: Intravenous  PONV Risk Score and Plan: 3 and Ondansetron, Dexamethasone, Midazolam and Treatment may vary due to age or medical condition  Airway Management Planned: Oral ETT  Additional Equipment:   Intra-op Plan:   Post-operative Plan: Extubation in OR  Informed Consent: I have reviewed the patients History and Physical, chart, labs and discussed the procedure including the risks, benefits and alternatives for the proposed anesthesia with the patient or authorized representative who has indicated his/her understanding and acceptance.       Plan Discussed with: CRNA, Anesthesiologist and Surgeon  Anesthesia Plan Comments:         Anesthesia Quick Evaluation

## 2018-11-21 NOTE — Anesthesia Procedure Notes (Signed)
Procedure Name: Intubation Date/Time: 11/21/2018 11:11 AM Performed by: Harden Mo, CRNA Pre-anesthesia Checklist: Patient identified, Emergency Drugs available, Suction available and Patient being monitored Patient Re-evaluated:Patient Re-evaluated prior to induction Oxygen Delivery Method: Circle System Utilized Preoxygenation: Pre-oxygenation with 100% oxygen Induction Type: IV induction Ventilation: Mask ventilation without difficulty Laryngoscope Size: Miller and 2 Grade View: Grade I Tube type: Oral Tube size: 7.0 mm Number of attempts: 1 Airway Equipment and Method: Stylet and Oral airway Placement Confirmation: ETT inserted through vocal cords under direct vision,  positive ETCO2 and breath sounds checked- equal and bilateral Secured at: 21 cm Tube secured with: Tape Dental Injury: Teeth and Oropharynx as per pre-operative assessment

## 2018-11-21 NOTE — Interval H&P Note (Signed)
History and Physical Interval Note:  11/21/2018 10:40 AM  Kaylee Romero  has presented today for surgery, with the diagnosis of RIGHT BREAST CANCER.  The various methods of treatment have been discussed with the patient and family. After consideration of risks, benefits and other options for treatment, the patient has consented to  Procedure(s): RIGHT BREAST LUMPECTOMY WITH SENTINEL LYMPH NODE BIOPSY (Right) INSERTION PORT-A-CATH WITH ULTRASOUND GUIDANCE (N/A) BILATERAL MAMMARY REDUCTION  (BREAST) (Bilateral) as a surgical intervention.  The patient's history has been reviewed, patient examined, no change in status, stable for surgery.  I have reviewed the patient's chart and labs.  Questions were answered to the patient's satisfaction.     Loel Lofty Dillingham

## 2018-11-21 NOTE — Discharge Instructions (Signed)
INSTRUCTIONS FOR AFTER BREAST SURGERY   You are getting ready to undergo breast surgery.  You will likely have some questions about what to expect following your operation.  The following information will help you and your family understand what to expect when you are discharged from the hospital.  Following these guidelines will help ensure a smooth recovery and reduce risks of complications.   Postoperative instructions include information on: diet, wound care, medications and physical activity.  AFTER SURGERY Expect to go home after the procedure.  In some cases, you may need to spend one night in the hospital for observation.  DIET Breast surgery does not require a specific diet.  However, I have to mention that the healthier you eat the better your body can start healing. It is important to increasing your protein intake.  This means limiting the foods with sugar and carbohydrates.  Focus on vegetables and some meat.  If you have any liposuction during your procedure be sure to drink water.  If your urine is bright yellow, then it is concentrated, and you need to drink more water.  As a general rule after surgery, you should have 8 ounces of water every hour while awake.  If you find you are persistently nauseated or unable to take in liquids let us know.  NO TOBACCO USE or EXPOSURE.  This will slow your healing process and increase the risk of a wound.  WOUND CARE If you don't have a drain:  You can shower the day after surgery. Use fragrance free soap.  Dial, [place] and [place] are usually mild on the skin. If you have a drain: You can shower five days after surgery.  Clean with baby wipes until the drain is removed.    If you have steri-strips / tape directly attached to your skin leave them in place. It is OK to get these wet.  No baths, pools or hot tubs for two weeks. We close your incision to leave the smallest and best-looking scar. No ointment or creams on your incisions until given the go  ahead.  Especially not Neosporin (Too many skin reactions with this one).  A few weeks after surgery you can use Mederma and start massaging the scar. We ask you to wear your binder or sports bra for the first 6 weeks around the clock, including while sleeping. This provides added comfort and helps reduce the fluid accumulation at the surgery site.  ACTIVITY No heavy lifting until cleared by the doctor.  This usually means no more than a half-gallon of milk.  It is OK to walk and climb stairs. In fact, moving your legs is very important to decrease your risk of a blood clot.  It will also help keep you from getting deconditioned.  Every 1 to 2 hours get up and walk for 5 minutes. This will help with a quicker recovery back to normal.  Let pain be your guide so you don't do too much.  NO, you cannot do the spring cleaning and don't plan on taking care of anyone else.  This is your time for TLC.  You will be more comfortable if you sleep and rest with your head elevated either with a few pillows under you or in a recliner.  No stomach sleeping for a three months.  WORK Everyone returns to work at different times. As a rough guide, most people take at least 1 - 2 weeks off prior to returning to work. If you need documentation  for your job, bring the forms to your postoperative follow up visit.  DRIVING Arrange for someone to bring you home from the hospital.  You may be able to drive a few days after surgery but not while taking any narcotics or valium.  BOWEL MOVEMENTS Constipation can occur after anesthesia and while taking pain medication.  It is important to stay ahead for your comfort.  We recommend taking Milk of Magnesia (2 tablespoons; twice a day) while taking the pain pills.  SEROMA This is fluid your body tried to put in the surgical site.  This is normal but if it creates tight skinny skin let us know.  It usually decreases in a few weeks.  WHEN TO CALL Call your surgeon's office if any  of the following occur:  Fever 101 degrees F or greater  Excessive bleeding or fluid from the incision site.  Pain that increases over time without aid from the medications  Redness, warmth, or pus draining from incision sites  Persistent nausea or inability to take in liquids  Severe misshapen area that underwent the operation.  Here are some resources:  1. Plastic surgery website: https://www.plasticsurgery.org/for-medical-professionals/education-and-resources/publications/breast-reconstruction-magazine 2. Breast Reconstruction Awareness Campaign:  [employer]nz 3. Plastic surgery Implant information:  https://www.plasticsurgery.org/patient-safety/breast-implant-safety

## 2018-11-21 NOTE — Transfer of Care (Signed)
Immediate Anesthesia Transfer of Care Note  Patient: Kaylee Romero  Procedure(s) Performed: RIGHT BREAST LUMPECTOMY WITH SENTINEL LYMPH NODE BIOPSY (Right Breast) INSERTION LEFT PORT-A-CATH WITH ULTRASOUND GUIDANCE (N/A Chest) BILATERAL MAMMARY REDUCTION  (BREAST) (Bilateral Breast)  Patient Location: PACU  Anesthesia Type:General  Level of Consciousness: awake, oriented and patient cooperative  Airway & Oxygen Therapy: Patient Spontanous Breathing and Patient connected to nasal cannula oxygen  Post-op Assessment: Report given to RN and Post -op Vital signs reviewed and stable  Post vital signs: Reviewed  Last Vitals:  Vitals Value Taken Time  BP 120/71 11/21/18 1424  Temp    Pulse 99 11/21/18 1426  Resp 15 11/21/18 1426  SpO2 100 % 11/21/18 1426  Vitals shown include unvalidated device data.  Last Pain:  Vitals:   11/21/18 0937  TempSrc: Oral  PainSc:       Patients Stated Pain Goal: 3 ([id number])  Complications: No apparent anesthesia complications

## 2018-11-21 NOTE — Interval H&P Note (Signed)
History and Physical Interval Note:  11/21/2018 9:53 AM  Kaylee Romero  has presented today for surgery, with the diagnosis of RIGHT BREAST CANCER.  The various methods of treatment have been discussed with the patient and family. After consideration of risks, benefits and other options for treatment, the patient has consented to  Procedure(s): RIGHT BREAST LUMPECTOMY WITH SENTINEL LYMPH NODE BIOPSY (Right) INSERTION PORT-A-CATH WITH ULTRASOUND GUIDANCE (N/A) BILATERAL MAMMARY REDUCTION  (BREAST) (Bilateral) as a surgical intervention.  The patient's history has been reviewed, patient examined, no change in status, stable for surgery.  I have reviewed the patient's chart and labs.  Questions were answered to the patient's satisfaction.     Autumn Messing III

## 2018-11-21 NOTE — Op Note (Signed)
11/21/2018  1:06 PM  PATIENT:  Kaylee Romero  38 y.o. female  PRE-OPERATIVE DIAGNOSIS:  RIGHT BREAST CANCER  POST-OPERATIVE DIAGNOSIS:  RIGHT BREAST CANCER  PROCEDURE:  Procedure(s): RIGHT BREAST LUMPECTOMY WITH DEEP RIGHT AXILLARYSENTINEL LYMPH NODE BIOPSY (Right) INSERTION LEFT PORT-A-CATH  BILATERAL MAMMARY REDUCTION  (BREAST) (Bilateral)  SURGEON:  Surgeon(s) and Role: Panel 1:    Jovita Kussmaul, MD - Primary Panel 2:    * Dillingham, Loel Lofty, DO - Primary  PHYSICIAN ASSISTANT:   ASSISTANTS: Dr. Marla Roe   ANESTHESIA:   local and general  EBL:  minimal   BLOOD ADMINISTERED:none  DRAINS: none   LOCAL MEDICATIONS USED:  MARCAINE     SPECIMEN:  Source of Specimen:  right breast tissue with sentinel nodes x 4  DISPOSITION OF SPECIMEN:  PATHOLOGY  COUNTS:  YES  TOURNIQUET:  * No tourniquets in log *  DICTATION: .Dragon Dictation   After informed consent was obtained the patient was brought to the operating room and placed in the supine position on the operating table.  After adequate induction of general anesthesia a roll was placed between the patient's shoulder blades to extend the shoulder slightly.  The left chest and neck area were then prepped with ChloraPrep, allowed to dry, and draped in usual sterile manner.  An appropriate timeout was performed.  The patient was placed in Trendelenburg position.  The area lateral to the bend of the clavicle on the left chest wall was infiltrated with quarter percent Marcaine.  A large bore needle from the Port-A-Cath kit was used to slide beneath the bend of the clavicle heading towards the sternal notch and in doing so I was able to access the left subclavian vein without difficulty.  A wire was placed through the needle using the Seldinger technique without difficulty.  The wire was confirmed in the central venous system using real-time fluoroscopy.  Next a small incision was made at the wire entry site.  The incision  was carried through the skin and subcutaneous tissue sharply with electrocautery.  A subcutaneous pocket was created inferior to the incision by blunt finger dissection.  Next the tubing was placed on the reservoir.  The reservoir was placed in the pocket and the length of the tubing was again estimated using real-time fluoroscopy.  The tubing was cut to the appropriate length.  A sheath and dilator were then fed over the wire using the Seldinger technique without difficulty.  The wire and dilator were removed.  The tubing was fed through the sheath as far as it would go and then held in place while the sheath was gently cracked and separated.  Another real-time fluoroscopy image showed the tip of the catheter to be in the distal superior vena cava.  The tubing was then permanently anchored to the reservoir.  The reservoir was anchored in the pocket with two 2-0 Prolene stitches.  The port was then aspirated and it aspirated blood easily.  The port was then flushed initially with a dilute heparin solution and then with a more concentrated heparin solution.  The subcutaneous tissue was closed over the port with interrupted 3-0 Vicryl stitches.  The skin was closed with a running 4-0 Monocryl subcuticular stitch.  Dermabond dressings were applied.  At this point all drapes were removed.  Next the bilateral chest, breast, and axillary areas were prepped with ChloraPrep, allowed to dry, and draped in usual sterile manner.  An appropriate timeout was performed.  Earlier in the  day the patient underwent injection of 1 mCi of technetium sulfur colloid in the subareolar position on the right.  The neoprobe was used to identify a hot spot in the right axilla.  This area was infiltrated with quarter percent Marcaine.  A small transversely oriented incision was made with a 15 blade knife.  The incision was carried through the skin and subcutaneous tissue sharply with electrocautery until the deep right axillary space was  entered.  I was able to identify 2 hot lymph nodes and 2 palpable lymph nodes.  These were all excised sharply with the electrocautery and the lymphatics and small vessels around them were controlled with clips.  Ex vivo counts on the 2 nodes were approximately 200 and 2000.  These were sent as sentinel does nodes numbers 1 through 4.  There were no other hot or palpable lymph nodes identified in the right axilla.  The area was examined and found to be hemostatic.  The deep layer of the wound was then closed with interrupted 3-0 Vicryl stitches.  The skin was closed with a running 4-0 Monocryl subcuticular stitch.  Attention was then turned to the right breast.  An elliptical incision was made in the upper portion of the breast overlying the palpable cancer under the direction of the plastic surgeons.  While palpating the cancer a circular portion of breast tissue was excised sharply with the electrocautery around the palpable mass.  Once the specimen was removed it was oriented with the appropriate paint colors.  A specimen radiograph was obtained that showed the clip to be near the center of the specimen.  The specimen was then sent to pathology for further evaluation.  Hemostasis was achieved using the Bovie electrocautery.  At this point the operation was turned over to Dr. Marla Roe for the reduction portion of the operation.  The patient tolerated the procedure well.  All needle sponge and instrument counts were correct.  PLAN OF CARE: Discharge to home after PACU  PATIENT DISPOSITION:  PACU - hemodynamically stable.   Delay start of Pharmacological VTE agent (>24hrs) due to surgical blood loss or risk of bleeding: not applicable

## 2018-11-21 NOTE — Progress Notes (Signed)
Per Dr. Cherene Altes not HCG test needed.

## 2018-11-21 NOTE — H&P (Signed)
Kaylee Romero  Location: [facility] Patient #: [id number] DOB: 12-25-1980 Undefined / Language: Cleophus Molt / Race: White Female   History of Present Illness  The patient is a 38 year old female who presents with breast cancer. We are asked to see the patient in consultation by Dr. Jana Hakim to evaluate her for a new right breast cancer. The patient is a 38 year old white female who presents with a palpable mass in the upper right breast for the last 2 months. It measured 2.1cm by u/s and the axilla looked neg. 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 She has a h/o breast cancer in a maternal great aunt. She quit smoking 2 years ago.   Past Surgical History  Tonsillectomy   Diagnostic Studies History Colonoscopy  never Mammogram  within last year Pap Smear  1-5 years ago  Medication History  Medications Reconciled  Social History Alcohol use  Moderate alcohol use. Caffeine use  Carbonated beverages, Coffee. Illicit drug use  Remotely quit drug use. Tobacco use  Former smoker.  Family History  Hypertension  Father.  Pregnancy / Birth History Age at menarche  89 years. Contraceptive History  Intrauterine device. Gravida  1 Length (months) of breastfeeding  3-6 Maternal age  55-40 Para  1 Regular periods   Other Problems  Anxiety Disorder  Breast Cancer     Review of Systems  General Present- Fatigue and Night Sweats. Not Present- Appetite Loss, Chills, Fever, Weight Gain and Weight Loss. Skin Not Present- Change in Wart/Mole, Dryness, Hives, Jaundice, New Lesions, Non-Healing Wounds, Rash and Ulcer. HEENT Not Present- Earache, Hearing Loss, Hoarseness, Nose Bleed, Oral Ulcers, Ringing in the Ears, Seasonal Allergies, Sinus Pain, Sore Throat, Visual Disturbances, Wears glasses/contact lenses and Yellow Eyes. Respiratory Not Present- Bloody sputum, Chronic Cough, Difficulty Breathing, Snoring  and Wheezing. Cardiovascular Not Present- Chest Pain, Difficulty Breathing Lying Down, Leg Cramps, Palpitations, Rapid Heart Rate, Shortness of Breath and Swelling of Extremities. Gastrointestinal Not Present- Abdominal Pain, Bloating, Bloody Stool, Change in Bowel Habits, Chronic diarrhea, Constipation, Difficulty Swallowing, Excessive gas, Gets full quickly at meals, Hemorrhoids, Indigestion, Nausea, Rectal Pain and Vomiting. Female Genitourinary Not Present- Frequency, Nocturia, Painful Urination, Pelvic Pain and Urgency. Musculoskeletal Not Present- Back Pain, Joint Pain, Joint Stiffness, Muscle Pain, Muscle Weakness and Swelling of Extremities. Neurological Present- Headaches. Not Present- Decreased Memory, Fainting, Numbness, Seizures, Tingling, Tremor, Trouble walking and Weakness. Psychiatric Present- Anxiety and Change in Sleep Pattern. Not Present- Bipolar, Depression, Fearful and Frequent crying. Endocrine Not Present- Cold Intolerance, Excessive Hunger, Hair Changes, Heat Intolerance, Hot flashes and New Diabetes. Hematology Present- Easy Bruising. Not Present- Blood Thinners, Excessive bleeding, Gland problems, HIV and Persistent Infections.   Physical Exam  General Mental Status-Alert. General Appearance-Consistent with stated age. Hydration-Well hydrated. Voice-Normal.  Head and Neck Head-normocephalic, atraumatic with no lesions or palpable masses. Trachea-midline. Thyroid Gland Characteristics - normal size and consistency.  Eye Eyeball - Bilateral-Extraocular movements intact. Sclera/Conjunctiva - Bilateral-No scleral icterus.  Chest and Lung Exam Chest and lung exam reveals -quiet, even and easy respiratory effort with no use of accessory muscles and on auscultation, normal breath sounds, no adventitious sounds and normal vocal resonance. Inspection Chest Wall - Normal. Back - normal.  Breast Note: There is a 2cm mobile mass in the upper right  breast with no skin changes. There is no palpable mass in the left breast. There is no palpable  axillary, supraclavicular, or cervical lymphadenopathy   Cardiovascular Cardiovascular examination reveals -normal heart sounds, regular rate and rhythm with no murmurs and normal pedal pulses bilaterally.  Abdomen Inspection Inspection of the abdomen reveals - No Hernias. Skin - Scar - no surgical scars. Palpation/Percussion Palpation and Percussion of the abdomen reveal - Soft, Non Tender, No Rebound tenderness, No Rigidity (guarding) and No hepatosplenomegaly. Auscultation Auscultation of the abdomen reveals - Bowel sounds normal.  Neurologic Neurologic evaluation reveals -alert and oriented x 3 with no impairment of recent or remote memory. Mental Status-Normal.  Musculoskeletal Normal Exam - Left-Upper Extremity Strength Normal and Lower Extremity Strength Normal. Normal Exam - Right-Upper Extremity Strength Normal and Lower Extremity Strength Normal.  Lymphatic Head & Neck  General Head & Neck Lymphatics: Bilateral - Description - Normal. Axillary  General Axillary Region: Bilateral - Description - Normal. Tenderness - Non Tender. Femoral & Inguinal  Generalized Femoral & Inguinal Lymphatics: Bilateral - Description - Normal. Tenderness - Non Tender.    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 I have discussed with her in detail the different options for treatment and at this point she is undecided between breast conservation and bilateral mastectomy with reconstruction. I will refer her to Plastic surgery for consultation. she will discuss adjuvant therapy with oncology. She will let me know when she has made a final decision. If she needs chemo she will also need a port and we  have discussed the risks and benefits of the surgery as well as some of the technical aspects and she understands Current Plans Referred to Oncology, for evaluation and follow up (Oncology). Routine. Referred to Surgery - Plastic, for evaluation and follow up (Plastic Surgery). Routine.

## 2018-11-22 ENCOUNTER — Telehealth: Payer: Self-pay

## 2018-11-22 ENCOUNTER — Encounter (HOSPITAL_COMMUNITY): Payer: Self-pay | Admitting: General Surgery

## 2018-11-22 ENCOUNTER — Ambulatory Visit
Admission: RE | Admit: 2018-11-22 | Discharge: 2018-11-22 | Disposition: A | Discharge status: Home / Self Care | Payer: BC Managed Care – PPO | Attending: General Surgery | Admitting: General Surgery

## 2018-11-22 ENCOUNTER — Other Ambulatory Visit: Payer: Self-pay | Admitting: Surgery

## 2018-11-22 ENCOUNTER — Ambulatory Visit
Admission: RE | Admit: 2018-11-22 | Discharge: 2018-11-22 | Disposition: A | Discharge status: Home / Self Care | Payer: BC Managed Care – PPO | Source: Ambulatory Visit | Attending: Surgery | Admitting: Surgery

## 2018-11-22 DIAGNOSIS — Z95828 Presence of other vascular implants and grafts: Secondary | ICD-10-CM | POA: Insufficient documentation

## 2018-11-22 IMAGING — CR DG CHEST 2V
1 series · 2 of 2 positions shown · non-contrast
Comparison: [DATE]

CLINICAL DATA: Pain and throat tightness

EXAM:
CHEST - 2 VIEW

[Series 1: dg chest 2 view · 0.14mm/px · 2 of 2 slices shown]
[im 1/2]
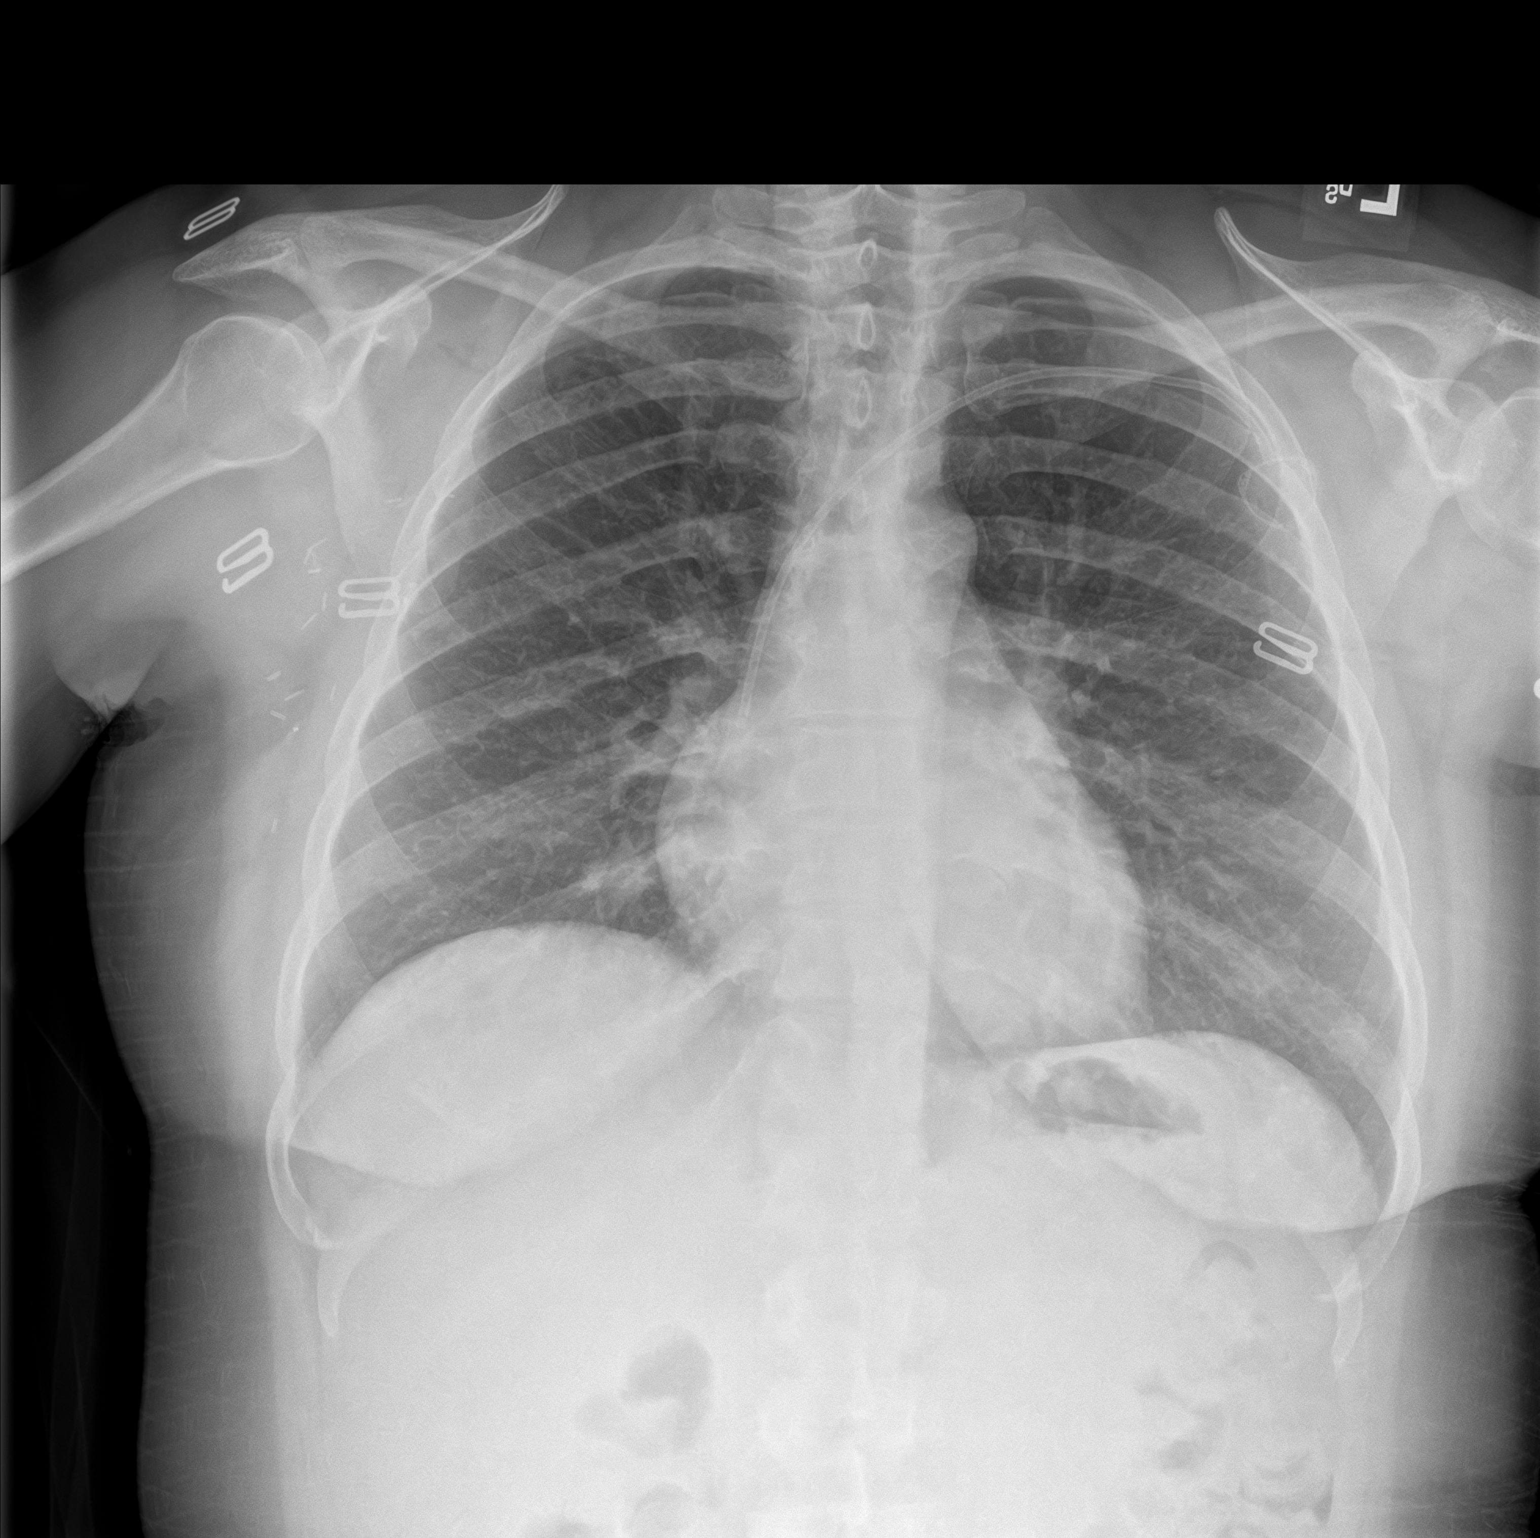
[im 2/2]
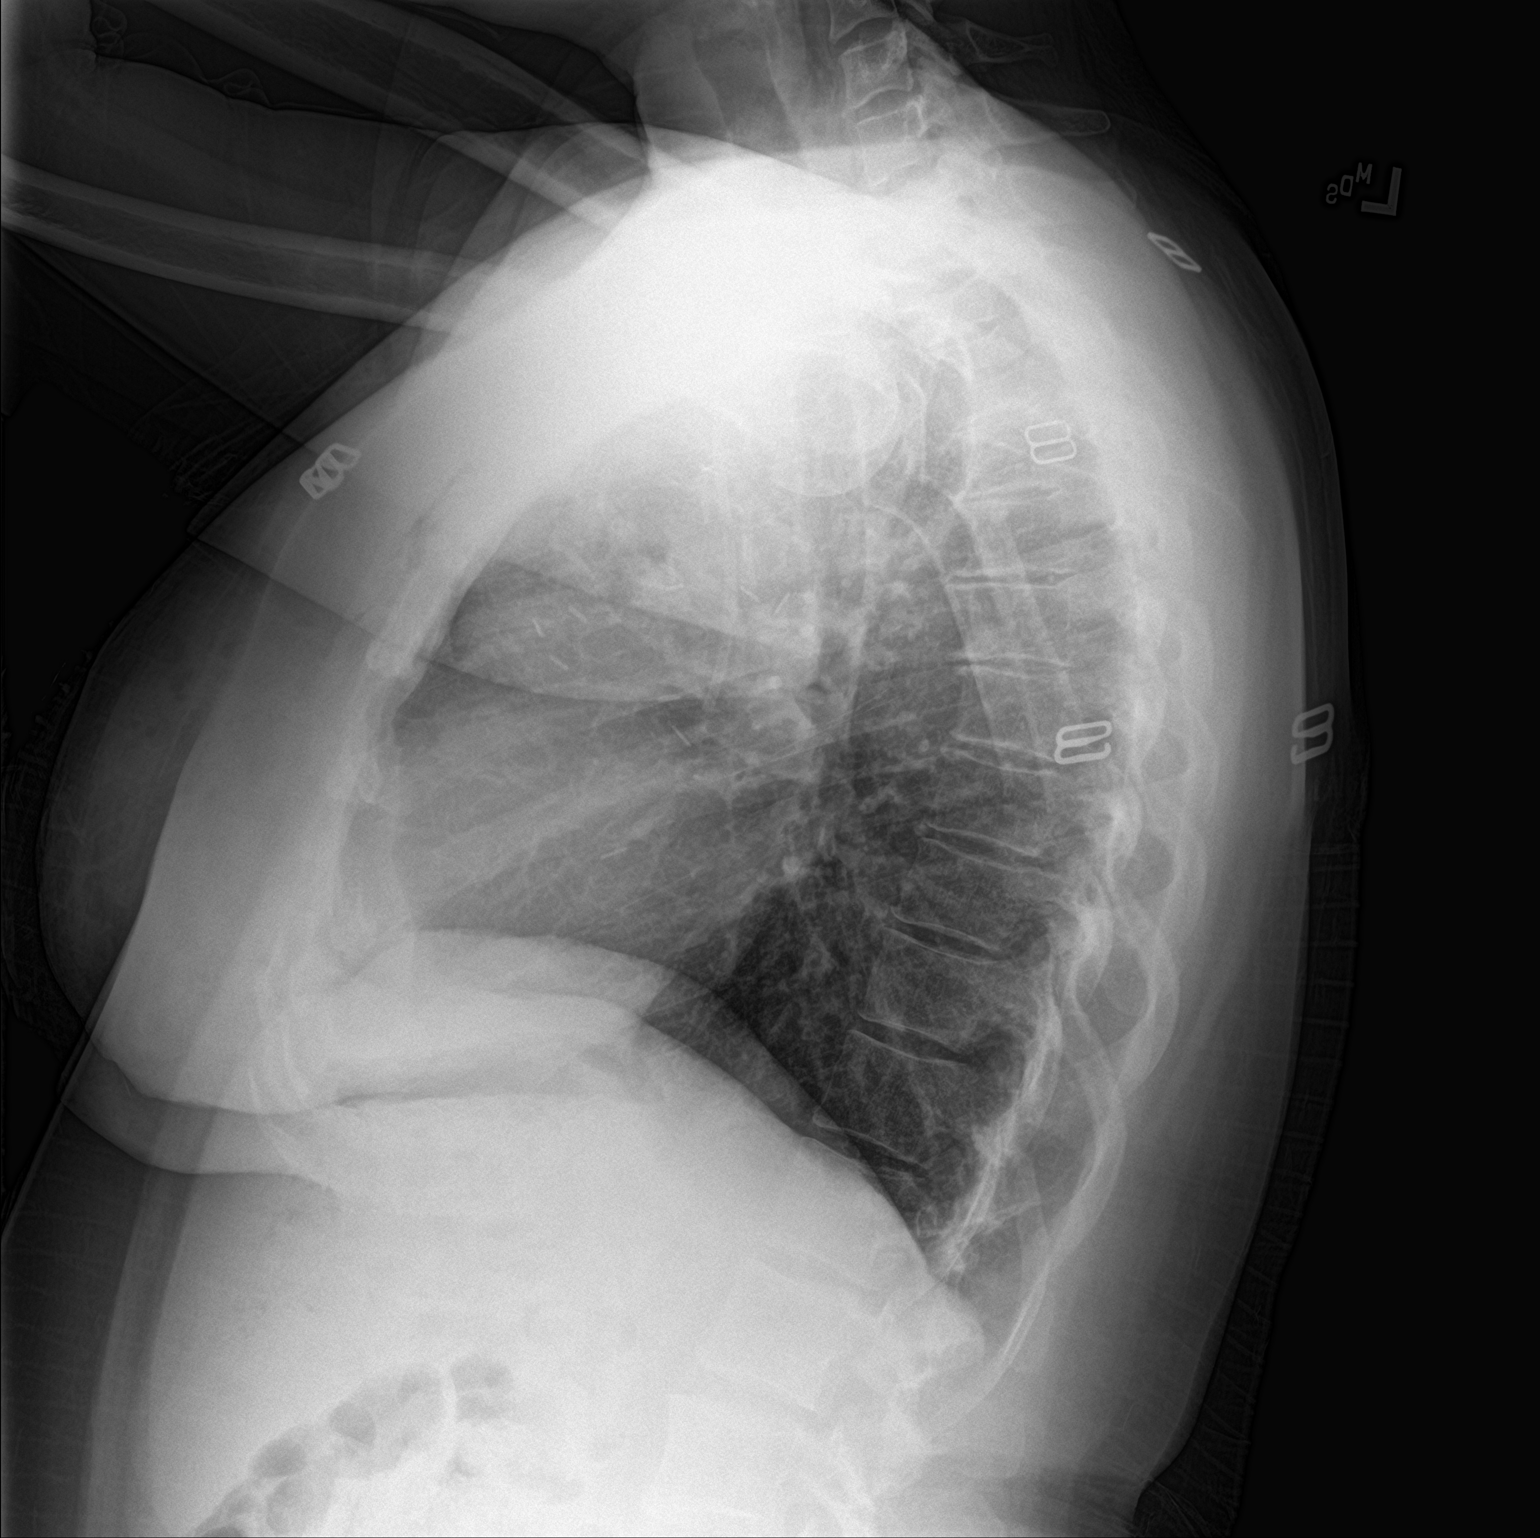

[2 of 2 positions shown; findings below may reference images not displayed]

FINDINGS: Left-sided central venous port with tip projecting over the distal
SVC. No pneumothorax. Normal heart size. Clips in the right axilla.
IMPRESSION: No active cardiopulmonary disease. Left-sided central venous port
tip over the distal SVC

## 2018-11-22 NOTE — Anesthesia Postprocedure Evaluation (Signed)
Anesthesia Post Note  Patient: Kaylee Romero  Procedure(s) Performed: RIGHT BREAST LUMPECTOMY WITH SENTINEL LYMPH NODE BIOPSY (Right Breast) INSERTION LEFT PORT-A-CATH WITH ULTRASOUND GUIDANCE (N/A Chest) BILATERAL MAMMARY REDUCTION  (BREAST) (Bilateral Breast)     Patient location during evaluation: PACU Anesthesia Type: General Level of consciousness: awake and alert Pain management: pain level controlled Vital Signs Assessment: post-procedure vital signs reviewed and stable Respiratory status: spontaneous breathing, nonlabored ventilation, respiratory function stable and patient connected to nasal cannula oxygen Cardiovascular status: blood pressure returned to baseline and stable Postop Assessment: no apparent nausea or vomiting Anesthetic complications: no    Last Vitals:  Vitals:   11/21/18 1454 11/21/18 1508  BP: 126/88 132/78  Pulse: 97 88  Resp: 11 15  Temp:    SpO2: 98% 99%    Last Pain:  Vitals:   11/21/18 1508  TempSrc:   PainSc: 0-No pain                 Kashmere Daywalt S

## 2018-11-22 NOTE — Telephone Encounter (Signed)
Patient called to state she is experiencing increased pain and soreness under her arm and at the chemo port site. She is taking hydrocodone, 1 tablet every 6 hours for pain control, but states that she only has relief for 3 to 4 hours. She would like to know what her options are for pain control.

## 2018-11-23 LAB — SURGICAL PATHOLOGY

## 2018-11-23 NOTE — Telephone Encounter (Signed)
Called and spoke with the patient on (11/22/18) regarding the message below.  Per [facility] can take Tylenol between the Hydrocodone doses, but do not take over 3000mg  per day.  Informed the patient the Hydrocodone has Tylenol in it.  Patient verbalized understanding and agreed.//AB/CMA

## 2018-11-24 ENCOUNTER — Encounter: Payer: Self-pay | Admitting: *Deleted

## 2018-11-26 ENCOUNTER — Telehealth: Payer: Self-pay | Admitting: Plastic Surgery

## 2018-11-26 MED ORDER — FLUCONAZOLE 150 MG PO TABS: 150.0000 mg | ORAL_TABLET | Freq: Every day | ORAL | 0 refills | Status: DC

## 2018-11-26 NOTE — Telephone Encounter (Signed)
+   Yeast infection

## 2018-11-29 ENCOUNTER — Encounter: Payer: BC Managed Care – PPO | Admitting: Plastic Surgery

## 2018-11-29 ENCOUNTER — Other Ambulatory Visit: Payer: Self-pay

## 2018-11-29 ENCOUNTER — Encounter: Payer: Self-pay | Admitting: Plastic Surgery

## 2018-11-29 ENCOUNTER — Ambulatory Visit (INDEPENDENT_AMBULATORY_CARE_PROVIDER_SITE_OTHER): Payer: BC Managed Care – PPO | Admitting: Plastic Surgery

## 2018-11-29 VITALS — BP 129/82 | HR 93 | Temp 98.2°F | Ht 64.0 in | Wt 177.0 lb

## 2018-11-29 DIAGNOSIS — Z170 Estrogen receptor positive status [ER+]: Secondary | ICD-10-CM

## 2018-11-29 DIAGNOSIS — C50411 Malignant neoplasm of upper-outer quadrant of right female breast: Secondary | ICD-10-CM

## 2018-11-29 NOTE — Progress Notes (Signed)
   Subjective:    Patient ID: Kaylee Romero, female    DOB: 04-Nov-1980, 38 y.o.   MRN: [id number]  The patient is a 38 year old female who underwent a partial mastectomy of the right breast with reductions bilaterally for oncoplastic purposes.  She is doing extremely well.  She has very good symmetry.  She has bruising and swelling as expected.  All incisions are healing nicely.  There does not appear to be any seroma or hematoma.  The patient reports Dr. [name] told her one node was positive.     Review of Systems  Constitutional: Positive for activity change. Negative for appetite change.  Eyes: Negative.   Respiratory: Negative.   Cardiovascular: Negative.   Gastrointestinal: Negative.   Endocrine: Negative.   Genitourinary: Negative.   Musculoskeletal: Negative.        Objective:   Physical Exam Vitals signs and nursing note reviewed.  Constitutional:      Appearance: Normal appearance.  Cardiovascular:     Rate and Rhythm: Normal rate.     Pulses: Normal pulses.  Pulmonary:     Effort: Pulmonary effort is normal. No respiratory distress.     Breath sounds: No wheezing.  Neurological:     General: No focal deficit present.     Mental Status: She is alert and oriented to person, place, and time.  Psychiatric:        Mood and Affect: Mood normal.        Behavior: Behavior normal.        Thought Content: Thought content normal.          Assessment & Plan:     ICD-10-CM   1. Malignant neoplasm of upper-outer quadrant of right breast in female, estrogen receptor positive ([place])  C50.411    Z17.0     Can go into a sports bra.  Can shower.  In 1 week start light massage to the right axilla

## 2018-12-03 NOTE — Progress Notes (Signed)
Kaylee Romero  [phone] [phone]     ID: ZOII FLORER DOB: 1980-07-10  MR#: [id number]  YYF#:[id number]  Patient Care Team: Linda Hedges, DO as PCP - General (Obstetrics and Gynecology) Mauro Kaufmann, RN as Oncology Nurse Navigator Rockwell Germany, RN as Oncology Nurse Navigator Gery Pray, MD as Consulting Physician (Radiation Oncology) Ezelle Surprenant, Virgie Dad, MD as Consulting Physician (Oncology) Jovita Kussmaul, MD as Consulting Physician (General Surgery) Dillingham, Loel Lofty, DO as Attending Physician (Plastic Surgery) Chauncey Cruel, MD OTHER MD:  CHIEF COMPLAINT: estrogen receptor positive breast cancer  CURRENT TREATMENT: Adjuvant chemotherapy   INTERVAL HISTORY: Kaylee Romero  returns today for follow-up of her estrogen receptor positive breast cancer.   She proceeded with right lumpectomy with sentinel lymph node biopsy on 11/21/2018. 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  Out of the four biopsied lymph nodes, one was positive for metastatic carcinoma (1/4).   REVIEW OF SYSTEMS: Kaylee Romero remains very uncomfortable from her surgery, which included bilateral breast reductions as well as of course the lumpectomy.  She has run through her hydrocodone which was only mildly effective.  It also constipated her.  She did not have nausea.  She did feel like a zombie on the medication.  She feels very itchy in the chest area and wonders if that is due to yeast or if it is due to some of the adhesives from tape.  Basically she continues to be very uncomfortable and particularly in the right armpit area and she feels that entire area is very swollen and painful.  She has not had fevers or bleeding.  She is being very irritable and does not like the way she feels right now.  They are taking appropriate pandemic precautions.  A  detailed review of systems today was otherwise stable   HISTORY OF CURRENT ILLNESS: From the original intake note:  Kaylee Romero (pronounced "Bram-Hall") found a palpable lump in the 12 o'clock position of the right breast, which he brought to the attention of her gynecologist.. She underwent bilateral diagnostic mammography with tomography and bilateral breast ultrasonography at [facility] on 09/23/2018 showing: breast density category C; a spiculated mass with associated with architectural distortion and pleomorphic calcifications lying in the 11-12 o'clock position, middle depth, measuring 2 cm in size, corresponding to the palpable abnormality.   Physical exam confirmed a firm but mobile mass in the 12 o'clock position of the right breast.  By ultrasound there was a highly suspicious 2.1 cm mass in the 12 o'clock position of the right breast; no other evidence of breast malignancy or lymphadenopathy bilaterally.Sonographic evaluation of the right axilla found no enlarged or abnormal appearing lymph node  In the left axilla there were several lymph nodes with areas of cortical prominence similar to the lymph node noted on the right.  Given the symmetry all these are presumed to be reactive  Accordingly on 09/26/2018 she proceeded to biopsy of the right breast area in question. The pathology from this procedure ([id number]) showed: invasive ductal carcinoma, grade 2; prognostic indicators significant for: estrogen receptor, 80% positive with moderate staining intensity and progesterone receptor, 90% positive with strong staining intensity. Proliferation marker Ki67 at 35%. HER2 negative by immunohistochemistry (1+).  The patient's subsequent history is as detailed below.   PAST MEDICAL HISTORY: Past Medical History:  Diagnosis Date   Anxiety  Family history of breast cancer    HSV (herpes simplex virus) anogenital infection    positive titer only    PAST SURGICAL  HISTORY: Past Surgical History:  Procedure Laterality Date   BREAST LUMPECTOMY WITH AXILLARY LYMPH NODE BIOPSY Right 11/21/2018   Procedure: RIGHT BREAST LUMPECTOMY WITH SENTINEL LYMPH NODE BIOPSY;  Surgeon: Jovita Kussmaul, MD;  Location: [facility];  Service: General;  Laterality: Right;   BREAST REDUCTION SURGERY Bilateral 11/21/2018   Procedure: BILATERAL MAMMARY REDUCTION  (BREAST);  Surgeon: Wallace Going, DO;  Location: [facility];  Service: Plastics;  Laterality: Bilateral;   PORTACATH PLACEMENT N/A 11/21/2018   Procedure: INSERTION LEFT PORT-A-CATH WITH ULTRASOUND GUIDANCE;  Surgeon: Jovita Kussmaul, MD;  Location: [facility];  Service: General;  Laterality: N/A;   TONSILLECTOMY  1987   TONSILLECTOMY     WISDOM TOOTH EXTRACTION      FAMILY HISTORY: Family History  Problem Relation Age of Onset   Hypertension Father    Alcohol abuse Paternal Grandfather    Heart disease Paternal Grandfather    Alcohol abuse Maternal Grandfather    Heart disease Maternal Grandmother    Breast cancer Other    Patient's parents are both living at age 71, as of 09/2018. Her father lives in [place], and her mother lives in [place], [place]. The patient denies a family hx of ovarian cancer. A maternal great aunt was diagnosed with breast cancer twice. She has 2 brothers, no sisters   GYNECOLOGIC HISTORY:  No LMP recorded. (Menstrual status: Chemotherapy). Menarche: 38 years old Age at first live birth: 38 years old [place] P 1 Currently having regular periods  Contraceptive: Mirena IUD in place HRT n/a  Hysterectomy? no BSO? no   SOCIAL HISTORY: (updated 10/05/2018)  Kaylee Romero is a [employer]. Husband Kaylee Romero is in management with [employer]. [name] is 19 months old.     ADVANCED DIRECTIVES: not in place. Her husband is automatically her HCPOA.   HEALTH MAINTENANCE: Social History   Tobacco Use   Smoking status: Former Smoker    Packs/day: 0.50    Years: 10.00    Pack  years: 5.00    Types: Cigarettes    Start date: 01/13/2002    Quit date: 10/17/2016    Years since quitting: 2.1   Smokeless tobacco: Never Used  Substance Use Topics   Alcohol use: Yes    Comment: Socially   Drug use: No     Colonoscopy: n/a  PAP: 09/20/2018  Bone density: n/a   No Known Allergies  Current Outpatient Medications  Medication Sig Dispense Refill   acetaminophen (TYLENOL) 500 MG tablet Take 1 tablet (500 mg total) by mouth every 6 (six) hours as needed. 60 tablet 0   amphetamine-dextroamphetamine (ADDERALL XR) 20 MG 24 hr capsule Take 20 mg by mouth daily.     Ascorbic Acid (VITAMIN C) 1000 MG tablet Take 1,000 mg by mouth daily.     citalopram (CELEXA) 20 MG tablet Take 20 mg by mouth at bedtime.      clobetasol cream (TEMOVATE) 3.50 % Apply 1 application topically daily.     dexamethasone (DECADRON) 4 MG tablet Take 2 tablets by mouth once a day on the day after chemotherapy and then take 2 tablets two times a day for 2 days. Take with food. 30 tablet 1   diazepam (VALIUM) 2 MG tablet Take 1 tablet (2 mg total) by mouth every 12 (twelve) hours as needed for muscle spasms. 30 tablet  0   fluconazole (DIFLUCAN) 150 MG tablet Take 1 tablet (150 mg total) by mouth daily. 2 tablet 0   HYDROcodone-acetaminophen (NORCO) 5-325 MG tablet Take 1 tablet by mouth every 6 (six) hours as needed for moderate pain. 20 tablet 0   hydrOXYzine (ATARAX/VISTARIL) 10 MG tablet Take 10 mg by mouth at bedtime.      lidocaine-prilocaine (EMLA) cream Apply to affected area once 30 g 3   loratadine (CLARITIN) 10 MG tablet Take 1 tablet (10 mg total) by mouth daily. 60 tablet 3   LORazepam (ATIVAN) 0.5 MG tablet Take 1 tablet (0.5 mg total) by mouth at bedtime as needed (Nausea or vomiting). 30 tablet 0   Multiple Vitamin (MULTIVITAMIN WITH MINERALS) TABS tablet Take 1 tablet by mouth daily.     naproxen sodium (ALEVE) 220 MG tablet Take 1 tablet (220 mg total) by mouth 3 (three)  times daily with meals. 60 tablet 3   prochlorperazine (COMPAZINE) 10 MG tablet Take 1 tablet (10 mg total) by mouth every 6 (six) hours as needed (Nausea or vomiting). 30 tablet 1   Tetrahydrozoline HCl (VISINE OP) Place 1 drop into both eyes daily as needed (redness).     vitamin B-12 (CYANOCOBALAMIN) 1000 MCG tablet Take 1,000 mcg by mouth daily.     No current facility-administered medications for this visit.     OBJECTIVE: Young white woman who appears stated age  50:   12/05/18 1031  BP: 115/76  Pulse: 82  Resp: 18  Temp: 97.8 F (36.6 C)  SpO2: 100%   Wt Readings from Last 3 Encounters:  12/05/18 178 lb 6.4 oz (80.9 kg)  11/29/18 177 lb (80.3 kg)  11/21/18 177 lb (80.3 kg)   Body mass index is 30.62 kg/m.    ECOG FS:1 - Symptomatic but completely ambulatory  Sclerae unicteric, EOMs intact Wearing a mask No cervical or supraclavicular adenopathy Lungs no rales or rhonchi Heart regular rate and rhythm Abd soft, nontender, positive bowel sounds MSK no focal spinal tenderness, no upper extremity lymphedema Neuro: nonfocal, well oriented, appropriate affect Breasts: Status post bilateral breast reductions.  The incisions are healing nicely and the cosmetic result is good.  I do not palpate any suspicious areas on either side.  The axillae are benign.   LAB RESULTS:  CMP     Component Value Date/Time   NA 139 11/16/2018 1330   K 3.6 11/16/2018 1330   CL 104 11/16/2018 1330   CO2 26 11/16/2018 1330   GLUCOSE 107 (H) 11/16/2018 1330   BUN 12 11/16/2018 1330   CREATININE 0.66 11/16/2018 1330   CREATININE 0.68 10/05/2018 1244   CALCIUM 9.3 11/16/2018 1330   PROT 7.5 10/05/2018 1244   ALBUMIN 4.6 10/05/2018 1244   AST 15 10/05/2018 1244   ALT 18 10/05/2018 1244   ALKPHOS 54 10/05/2018 1244   BILITOT 0.5 10/05/2018 1244   GFRNONAA >60 11/16/2018 1330   GFRNONAA >60 10/05/2018 1244   GFRAA >60 11/16/2018 1330   GFRAA >60 10/05/2018 1244    No results  found for: TOTALPROTELP, ALBUMINELP, A1GS, A2GS, BETS, BETA2SER, GAMS, MSPIKE, SPEI  No results found for: KPAFRELGTCHN, LAMBDASER, KAPLAMBRATIO  Lab Results  Component Value Date   WBC 10.0 11/16/2018   NEUTROABS 6.3 10/05/2018   HGB 13.9 11/16/2018   HCT 40.5 11/16/2018   MCV 94.6 11/16/2018   PLT 283 11/16/2018    _0 @  No results found for: LABCA2  No components found for: [id number]  No results  for input(s): INR in the last 168 hours.  No results found for: LABCA2  No results found for: EHM094  No results found for: BSJ628  No results found for: ZMO294  No results found for: CA2729  No components found for: HGQUANT  No results found for: CEA1 / No results found for: CEA1   No results found for: AFPTUMOR  No results found for: CHROMOGRNA  No results found for: PSA1  No visits with results within 3 Day(s) from this visit.  Latest known visit with results is:  Admission on 11/21/2018, Discharged on 11/21/2018  Component Date Value Ref Range Status   SURGICAL PATHOLOGY 11/21/2018    Final-Edited                   Value:SURGICAL PATHOLOGY CASE: [id number] PATIENT: Kaylee Romero Surgical Pathology Report     Clinical History: Right breast cancer (cm)     FINAL MICROSCOPIC DIAGNOSIS:  A. BREAST, RIGHT, LUMPECTOMY: - Invasive ductal carcinoma, 2.6 cm, Nottingham grade 2 of 3. - Margins of resection are not involved (Closest margin: 2.5 mm, anterior). - Ductal carcinoma in situ. - See oncology table.  B. SENTINEL LYMPH NODE, RIGHT AXILLARY #1, BIOPSY: - Metastatic carcinoma present in (1) of (1) lymph node.  C. SENTINEL LYMPH NODE, RIGHT AXILLARY #2, BIOPSY: - One lymph node, negative for carcinoma (0/1).  D. SENTINEL LYMPH NODE, RIGHT AXILLARY #3, BIOPSY: - One lymph node, negative for carcinoma (0/1).  E. SENTINEL LYMPH NODE, RIGHT AXILLARY #4, BIOPSY: - One lymph node, negative for carcinoma (0/1).  F. BREAST, LEFT  INFERIOR, AND SKIN, MAMMAPLASTY: - Benign breast tissue and skin.  G. BREAST, LEFT SUPERIOR, MAMMAPLASTY: - Benign breast tissue.  H. BREAST, RIGHT                          INFERIOR, AND SKIN, MAMMAPLASTY: - Benign breast tissue and skin.  I. BREAST, RIGHT SUPERIOR, MAMMAPLASTY: - Benign breast tissue.  ONCOLOGY TABLE:  INVASIVE CARCINOMA OF THE BREAST:  Resection  Procedure: Lumpectomy Specimen Laterality: Right Tumor Size: 2.6 cm Histologic Type: Invasive ductal carcinoma Histologic Grade:      Glandular (Acinar)/Tubular Differentiation: 3      Nuclear Pleomorphism: 3      Mitotic Rate: 1      Overall Grade: 2 Ductal Carcinoma In Situ: Present Tumor Extension: Limited to breast parenchyma Margins: Uninvolved by invasive carcinoma      Distance from closest margin: 2.5 mm      Specify closest margin: Anterior DCIS Margins: Uninvolved by DCIS      Distance from closest margin: Less than 1 mm      Specify closest margin: Inferior, Medial Regional Lymph Nodes:      Number of Lymph Nodes Examined: 4      Number of Sentinel Nodes Examined: 4      Number of Lymph Nodes with Macrometastases: 1      Number of Lymph Nodes with Micr                         ometastases: 0      Number of Lymph Nodes with Isolated Tumor Cells: 0      Size of Largest Metastatic Deposit: 11 mm      Extranodal Extension: Not identified Treatment Effect: No known presurgical therapy Breast Biomarker Testing Performed on Previous Biopsy: Yes       Testing Performed on Case  Number: [id number]            Estrogen Receptor: 80%, positive, moderate            Progesterone Receptor: 90%, positive, strong            HER2: Negative (1+)            Ki-67: 35% Representative Tumor Block: A1 Pathologic Stage Classification (pTNM, AJCC 8th Edition): pT2, pN1a (v4.4.0.0)       GROSS DESCRIPTION:  Specimen A: Right breast lumpectomy, in formalin at 1319 hrs. Size: 5 cm from superior to inferior, 5.6  cm from medial to lateral, 8 cm from anterior to posterior. Orientation: The specimen is inked as follows: Green anterior, blue inferior, orange lateral, yellow medial, black posterior, red superior. Over the anterior aspect is a 3.2 x 1.5 cm tan-white                          wrinkled skin ellipse. Localized area: None. Cut surface: There is a 2.6 x 2.4 x 2.4 cm pink-white firm ill-defined mass which contains a clip. Margins: The mass is 0.5 cm from the anterior margin and 1 cm or greater from all remaining margins. Prognostic indicators: Not taken at gross. Block summary: Blocks 1, 2 = mass, without margin. Block 3 = anterior margin nearest mass. Block 4 = posterior margin nearest mass. Blocks 5-6 = superior margin nearest mass. Blocks 7-10 = inferior margin nearest mass. Blocks 11-12 = medial margin nearest mass. Block 13 = lateral margin nearest mass.  Specimen B: Received fresh is a 1.8 cm soft to firm lymph node, clinically right axillary sentinel, which has a gray-white to tan-red nodular cut surface.  Bisected, entirely submitted in 1 block.  Specimen C: Received fresh is a 0.8 cm soft lymph node, clinically right axillary sentinel, which has a tan-yellow cut surface.  Bisected, entirely submitted in 1 block.  Specimen D: Re                         ceived fresh is a 0.5 cm soft lymph node, clinically right axillary sentinel, which has a tan-yellow cut surface.  Bisected, entirely submitted in 1 block.  Specimen E: Received fresh is a 2.8 cm soft lymph node, clinically right axillary sentinel, which has tan-yellow cut surface.  Sectioned, entirely submitted in 2 blocks.  Specimen F: Received fresh is a 64 g, 7.6 x 6.1 x 3 cm aggregate of soft fatty tissue and tan-white wrinkled skin.  Cut surfaces are predominantly soft fatty tissue with a small amount of white fibrous tissue.  A discrete lesion or mass is not identified.  Representative sections, submitted in 2  blocks.  Specimen G: Left superior breast tissue, received fresh. Weight: 119 g. Size in aggregate: 11.3 x 7.4 x 2.5 cm in aggregate. Skin: Tan-white, grossly unremarkable. Cut Surface: Predominantly soft fatty tissue with a small amount of white fibrous tissue.  A discrete lesion or mass is not identified. Block Summary:  Representative sections, submi                         tted in 2 blocks.  Specimen H: Received fresh are 2 pieces of tan-white wrinkled skin, 5.4 x 3.6 cm and 12.8 x 3 cm.  The smaller piece has a small amount of underlying soft fatty tissue up to 1.2 cm thick.  The larger piece is  up to 1 cm thick with no underlying tissue.  Representative sections, submitted in 2 blocks.  Specimen I: Received fresh is 2.4 x 1.7 x 1 cm of soft fatty tissue with unremarkable cut surfaces.  Sectioned, entirely submitted in 2 blocks.  SW 11/22/2018     Final Diagnosis performed by Gillie Manners, MD.   Electronically signed 11/23/2018 Technical and / or Professional components performed at [facility]. [facility], [place] [address], [place], [place] [address].  Immunohistochemistry Technical component (if applicable) was performed at [facility]. [address], [place], [place], [place] [address].   IMMUNOHISTOCHEMISTRY DISCLAIMER (if applicable): Some of these immunohistochemical stains may have been develope                         d and the performance characteristics determine by [facility]. Some may not have been cleared or approved by the U.S. Food and Drug Administration. The FDA has determined that such clearance or approval is not necessary. This test is used for clinical purposes. It should not be regarded as investigational or for research. This laboratory is certified under the [place] (CLIA-88) as qualified to perform high complexity clinical laboratory testing.  The controls  stained appropriately.     (this displays the last labs from the last 3 days)  No results found for: TOTALPROTELP, ALBUMINELP, A1GS, A2GS, BETS, BETA2SER, GAMS, MSPIKE, SPEI (this displays SPEP labs)  No results found for: KPAFRELGTCHN, LAMBDASER, KAPLAMBRATIO (kappa/lambda light chains)  No results found for: HGBA, HGBA2QUANT, HGBFQUANT, HGBSQUAN (Hemoglobinopathy evaluation)   No results found for: LDH  No results found for: IRON, TIBC, IRONPCTSAT (Iron and TIBC)  No results found for: FERRITIN  Urinalysis    Component Value Date/Time   BILIRUBINUR Neg 01/18/2012 1458   PROTEINUR Neg 01/18/2012 1458   UROBILINOGEN 0.2 01/18/2012 1458   NITRITE Neg 01/18/2012 1458   LEUKOCYTESUR Negative 01/18/2012 1458     STUDIES: Dg Chest 2 View  Result Date: 11/22/2018 CLINICAL DATA:  Pain and throat tightness EXAM: CHEST - 2 VIEW COMPARISON:  11/21/2018 FINDINGS: Left-sided central venous port with tip projecting over the distal SVC. No pneumothorax. Normal heart size. Clips in the right axilla. IMPRESSION: No active cardiopulmonary disease. Left-sided central venous port tip over the distal SVC Electronically Signed   By: Donavan Foil M.D.   On: 11/22/2018 23:11   Nm Sentinel Node Inj-no Rpt (breast)  Result Date: 11/21/2018 Sulfur colloid was injected by the nuclear medicine technologist for melanoma sentinel node.   Dg Chest Port 1 View  Result Date: 11/21/2018 CLINICAL DATA:  Status post Port-A-Cath insertion. EXAM: PORTABLE CHEST 1 VIEW COMPARISON:  None. FINDINGS: Power port has been inserted. The tip is in the superior vena cava 3.7 cm below the carina in good position. No pneumothorax. Heart size and vascularity are normal. Lungs are clear. No acute bone abnormality. Multiple surgical clips in the right axilla. IMPRESSION: Satisfactory appearance of the chest after Port-A-Cath insertion. No pneumothorax. No acute abnormalities. Electronically Signed   By: Lorriane Shire  M.D.   On: 11/21/2018 15:12   Dg Fluoro Guide Cv Line-no Report  Result Date: 11/21/2018 Fluoroscopy was utilized by the requesting physician.  No radiographic interpretation.    ELIGIBLE FOR AVAILABLE RESEARCH PROTOCOL: no  ASSESSMENT: 38 y.o. [place] woman status post right breast upper inner quadrant biopsy on 09/26/2018 for a clinical T2 N0, stage IB invasive ductal carcinoma,  grade 2, estrogen and progesterone receptor positive, HER-2 nonamplified, with an MIB-1-1 of 35%  (1) Genetic testing reported on 10/05/2018 through the [facility] Breast Cancer STAT Panel + Common Hereditary cancer panel found no pathogenic mutations. The STAT Breast cancer panel offered by [facility] includes sequencing and rearrangement analysis for the following 9 genes:  ATM, BRCA1, BRCA2, CDH1, CHEK2, PALB2, PTEN, STK11 and TP53. The Common Hereditary Cancers Panel offered by [facility] includes sequencing and/or deletion duplication testing of the following 47 genes: APC, ATM, AXIN2, BARD1, BMPR1A, BRCA1, BRCA2, BRIP1, CDH1, CDKN2A (p14ARF), CDKN2A (p16INK4a), CKD4, CHEK2, CTNNA1, DICER1, EPCAM (Deletion/duplication testing only), GREM1 (promoter region deletion/duplication testing only), KIT, MEN1, MLH1, MSH2, MSH3, MSH6, MUTYH, NBN, NF1, NHTL1, PALB2, PDGFRA, PMS2, POLD1, POLE, PTEN, RAD50, RAD51C, RAD51D, SDHB, SDHC, SDHD, SMAD4, SMARCA4. STK11, TP53, TSC1, TSC2, and VHL.  The following genes were evaluated for sequence changes only: SDHA and HOXB13 c.251G>A variant only.   (2) MammaPrint obtained from the original breast biopsy returned "high risk" indicating the patient will have an excellent prognosis if she receives adjuvant chemotherapy  (3) status post right lumpectomy and right axillary sentinel lymph node sampling 11/21/2018 for a pT2 pN1, stage IIA invasive ductal carcinoma, grade 2, with negative margins  (a) a total of 4 sentinel lymph nodes removed, 1 with macrometastasis  (b) status post bilateral breast  reductions at the time of lumpectomy  (4) adjuvant chemotherapy will consist of cyclophosphamide and doxorubicin in dose dense fashion x4 followed by weekly paclitaxel x12  (a) echo 10/27/2018 shows an ejection fraction in the 55-60% range  (5) adjuvant radiation to follow  (6) tamoxifen started 10/05/2018 in anticipation of surgical delays, discontinued 12/05/2018   PLAN: Kaylee Romero is still actively recovering from her surgery.  She is very uncomfortable and this makes her irritable.  She does not like the way she feels now and I reassured her that she will feel considerably better in one or 2 weeks.  I think she would do better as far as pain control is concerned to take Aleve 220 mg with Tylenol 500 mg three times a day preferably with food.  I went ahead though and gave her an additional 20 hydrocodone in case the Aleve/Tylenol combination was not sufficient.  She understands if she does take the hydrocodone she will be constipated and she will need to take stool softeners and MiraLAX and we wrote all that out for her.  We then discussed chemotherapy.  She has a positive lymph node.  This stages are as to be.  She had a high risk MammaPrint and note that the 95% 5-year disease-free survival predicted from that study applies only to node-negative patients.  Her own prognosis is likely her to show a disease-free survival at 5 years and a 90% / 93% range.  We then discussed chemotherapy which originally was going to be docetaxel and cyclophosphamide, but which given the positive lymph node is going to be cyclophosphamide, doxorubicin and paclitaxel.  We discussed the possible toxicities side effects and complications of these agents and I have gone ahead and placed all the orders as well as all the supportive medicine orders so she can discuss this with the chemotherapy teaching nurse.  I suggested we start December 15 but she is very uncomfortable still as noted above and she would like to make more  of the Christmas holidays.  Accordingly she will start 01/05/2019.  She is hoping to be able to get back to work the Monday following her Thursday  treatments  We also reviewed her echocardiogram which is favorable  She is taking appropriate pandemic precautions.  She knows to call if for any other issues that may develop before her next visit  Kaylee Romero, Virgie Dad, MD  12/05/18 1:44 PM Medical Oncology and Hematology [facility] [place], [place] [address] Tel. [phone]    Fax. [phone]   I, Wilburn Mylar, am acting as scribe for Dr. Virgie Dad. Kaylee Romero.  I, Lurline Del MD, have reviewed the above documentation for accuracy and completeness, and I agree with the above.

## 2018-12-05 ENCOUNTER — Encounter: Payer: Self-pay | Admitting: *Deleted

## 2018-12-05 ENCOUNTER — Other Ambulatory Visit: Payer: Self-pay

## 2018-12-05 ENCOUNTER — Inpatient Hospital Stay: Payer: BC Managed Care – PPO | Attending: Oncology | Admitting: Oncology

## 2018-12-05 VITALS — BP 115/76 | HR 82 | Temp 97.8°F | Resp 18 | Ht 64.0 in | Wt 178.4 lb

## 2018-12-05 DIAGNOSIS — Z170 Estrogen receptor positive status [ER+]: Secondary | ICD-10-CM | POA: Insufficient documentation

## 2018-12-05 DIAGNOSIS — C50411 Malignant neoplasm of upper-outer quadrant of right female breast: Secondary | ICD-10-CM | POA: Diagnosis not present

## 2018-12-05 DIAGNOSIS — C50811 Malignant neoplasm of overlapping sites of right female breast: Secondary | ICD-10-CM | POA: Insufficient documentation

## 2018-12-05 MED ORDER — LIDOCAINE-PRILOCAINE 2.5-2.5 % EX CREA: TOPICAL_CREAM | CUTANEOUS | 3 refills | Status: DC

## 2018-12-05 MED ORDER — DEXAMETHASONE 4 MG PO TABS: ORAL_TABLET | ORAL | 1 refills | Status: DC

## 2018-12-05 MED ORDER — LORAZEPAM 0.5 MG PO TABS: 0.5000 mg | ORAL_TABLET | Freq: Every evening | ORAL | 0 refills | Status: DC | PRN

## 2018-12-05 MED ORDER — ACETAMINOPHEN 500 MG PO TABS: 500.0000 mg | ORAL_TABLET | Freq: Four times a day (QID) | ORAL | 0 refills | Status: DC | PRN

## 2018-12-05 MED ORDER — HYDROCODONE-ACETAMINOPHEN 5-325 MG PO TABS: 1.0000 | ORAL_TABLET | Freq: Four times a day (QID) | ORAL | 0 refills | Status: DC | PRN

## 2018-12-05 MED ORDER — PROCHLORPERAZINE MALEATE 10 MG PO TABS: 10.0000 mg | ORAL_TABLET | Freq: Four times a day (QID) | ORAL | 1 refills | Status: DC | PRN

## 2018-12-05 MED ORDER — LORATADINE 10 MG PO TABS: 10.0000 mg | ORAL_TABLET | Freq: Every day | ORAL | 3 refills | Status: DC

## 2018-12-05 MED ORDER — NAPROXEN SODIUM 220 MG PO TABS: 220.0000 mg | ORAL_TABLET | Freq: Three times a day (TID) | ORAL | 3 refills | Status: DC

## 2018-12-05 NOTE — Progress Notes (Signed)
DISCONTINUE ON PATHWAY REGIMEN - Breast     A cycle is every 21 days:     Docetaxel      Cyclophosphamide   **Always confirm dose/schedule in your pharmacy ordering system**  REASON: Other Reason PRIOR TREATMENT: [id number]: TC - Docetaxel, Cyclophosphamide q21 Days x 4 Cycles TREATMENT RESPONSE: Unable to Evaluate  START ON PATHWAY REGIMEN - Breast   Dose-Dense AC q14 days:   A cycle is every 14 days:     Doxorubicin      Cyclophosphamide      Pegfilgrastim-xxxx   **Always confirm dose/schedule in your pharmacy ordering system**  Paclitaxel 80 mg/m2 Weekly:   Administer weekly:     Paclitaxel   **Always confirm dose/schedule in your pharmacy ordering system**  Patient Characteristics: Postoperative without Neoadjuvant Therapy (Pathologic Staging), Invasive Disease, Adjuvant Therapy, HER2 Negative/Unknown/Equivocal, ER Positive, Node Positive, Node Positive (1-3), MammaPrint(R) Ordered, High Genomic Risk Therapeutic Status: Postoperative without Neoadjuvant Therapy (Pathologic Staging) AJCC Grade: GX AJCC N Category: pNX AJCC M Category: cM0 ER Status: Positive (+) AJCC 8 Stage Grouping: IIB HER2 Status: Negative (-) Oncotype Dx Recurrence Score: Ordered Other Genomic Test AJCC T Category: pTX PR Status: Positive (+) Has this patient completed genomic testing<= Yes - MammaPrint(R) MammaPrint(R) Score: High Genomic Risk Intent of Therapy: Curative Intent, Discussed with Patient

## 2018-12-06 ENCOUNTER — Telehealth: Payer: Self-pay | Admitting: Oncology

## 2018-12-06 ENCOUNTER — Telehealth: Payer: Self-pay | Admitting: *Deleted

## 2018-12-06 NOTE — Telephone Encounter (Signed)
12/06/2018 at 4:36pm - Rn received referral from Dr. Jana Hakim on 12/05/18.  He asked that the research nurse screen this pt for all studies that she might qualify for participation.  The pt was deemed eligible for 2 breast trials:  UpBEAT and [id number].  The research nurse called the pt, but the pt was not available.  The nurse left the pt a message asking her to return the nurse's call.  Will await pt's return call.   Brion Aliment RN, BSN, CCRP  Clinical Research Nurse 12/06/2018 4:38 PM

## 2018-12-06 NOTE — Telephone Encounter (Signed)
I left a message regarding schedule  

## 2018-12-07 ENCOUNTER — Telehealth: Payer: Self-pay | Admitting: Plastic Surgery

## 2018-12-07 NOTE — Telephone Encounter (Signed)
Pt called to ask a few follow up questions:   1. Can she lift her daughter approx 20lbs? Everything is healing well, but there is some swelling on the right side which Dr. [name] probably mentioned to D yesterday over the phone.  2. Can she wear any wireless bra or does it need to be a sports bra?  3. Can she sleep on her side?

## 2018-12-07 NOTE — Telephone Encounter (Signed)
Her daughter can sit on her lap, no lifting. Wireless bra in 2 weeks. Sleep on whatever side is most comfortable.

## 2018-12-07 NOTE — Telephone Encounter (Signed)
Called and [facility] @ 3:19pm asking the patient to RTC regarding message below.//AB/CMA

## 2018-12-08 NOTE — Telephone Encounter (Signed)
Spoke with the patient and informed her of the message below from [facility].  Patient verbalized understanding and agreed.//AB/CMA

## 2018-12-12 ENCOUNTER — Ambulatory Visit: Payer: BC Managed Care – PPO | Attending: General Surgery | Admitting: Physical Therapy

## 2018-12-12 ENCOUNTER — Other Ambulatory Visit: Payer: Self-pay

## 2018-12-12 ENCOUNTER — Encounter: Payer: Self-pay | Admitting: Physical Therapy

## 2018-12-12 DIAGNOSIS — R600 Localized edema: Secondary | ICD-10-CM | POA: Diagnosis present

## 2018-12-12 DIAGNOSIS — M25611 Stiffness of right shoulder, not elsewhere classified: Secondary | ICD-10-CM | POA: Diagnosis present

## 2018-12-12 DIAGNOSIS — R293 Abnormal posture: Secondary | ICD-10-CM | POA: Diagnosis present

## 2018-12-12 DIAGNOSIS — C50411 Malignant neoplasm of upper-outer quadrant of right female breast: Secondary | ICD-10-CM | POA: Insufficient documentation

## 2018-12-12 DIAGNOSIS — Z170 Estrogen receptor positive status [ER+]: Secondary | ICD-10-CM | POA: Diagnosis present

## 2018-12-12 DIAGNOSIS — Z483 Aftercare following surgery for neoplasm: Secondary | ICD-10-CM | POA: Diagnosis present

## 2018-12-12 NOTE — Patient Instructions (Signed)
Closed Chain: Shoulder Flexion / Extension - on Wall    Hands on wall, step backward. Return. Stepping causes shoulder flexion and extension Do __3_ times, holding 5 seconds, _3__ times per day.  http://ss.exer.us/265   Copyright  VHI. All rights reserved.  Closed Chain: Shoulder Abduction / Adduction - on Wall    One hand on wall, step to side and return. Stepping causes shoulder to abduct and adduct. Step _5__ times, holding 5 seconds, _3__ times per day.  http://ss.exer.us/267   Copyright  VHI. All rights reserved.

## 2018-12-12 NOTE — Therapy (Addendum)
[place], [place], [address] Phone: [phone]   Fax:  [phone]  Physical Therapy Treatment  Patient Details  Name: Kaylee Romero MRN: [id number] Date of Birth: 04-Oct-1980 Referring Provider (PT): Dr. Autumn Messing   Encounter Date: 12/12/2018  PT End of Session - 12/12/18 1455    Visit Number  2    Number of Visits  3    Date for PT Re-Evaluation  02/06/19    PT Start Time  1400    PT Stop Time  1455    PT Time Calculation (min)  55 min    Activity Tolerance  Patient tolerated treatment well    Behavior During Therapy  [facility] for tasks assessed/performed       Past Medical History:  Diagnosis Date  . Anxiety   . Family history of breast cancer   . HSV (herpes simplex virus) anogenital infection    positive titer only    Past Surgical History:  Procedure Laterality Date  . BREAST LUMPECTOMY WITH AXILLARY LYMPH NODE BIOPSY Right 11/21/2018   Procedure: RIGHT BREAST LUMPECTOMY WITH SENTINEL LYMPH NODE BIOPSY;  Surgeon: Jovita Kussmaul, MD;  Location: [place];  Service: General;  Laterality: Right;  . BREAST REDUCTION SURGERY Bilateral 11/21/2018   Procedure: BILATERAL MAMMARY REDUCTION  (BREAST);  Surgeon: Wallace Going, DO;  Location: [place];  Service: Plastics;  Laterality: Bilateral;  . PORTACATH PLACEMENT N/A 11/21/2018   Procedure: INSERTION LEFT PORT-A-CATH WITH ULTRASOUND GUIDANCE;  Surgeon: Jovita Kussmaul, MD;  Location: [place];  Service: General;  Laterality: N/A;  . TONSILLECTOMY  1987  . TONSILLECTOMY    . WISDOM TOOTH EXTRACTION      There were no vitals filed for this visit.  Subjective Assessment - 12/12/18 1401    Subjective  Patient reports she underwent a right lumpectomy with sentinel node biopsy (1/4 positive) and a bilateral breast reduction and lift. She had a Mammaprint test that showed high risk for recurrence so she will have chemotherapy followed by radiation and  anti-estrogen therapy.    Pertinent History  Patient was diagnosed on 09/23/2018 with right invasive ductal carcinoma breast cancer. Patient reports she underwent a right lumpectomy with sentinel node biopsy (1/4 positive) and a bilateral breast reduction and lift. It is ER/PR positive and HER2 negative with a Ki67 of 35%.    Patient Stated Goals  Improve shoulder mobility    Currently in Pain?  Yes    Pain Score  3     Pain Location  Axilla    Pain Orientation  Right    Pain Descriptors / Indicators  Tightness    Pain Type  Surgical pain    Pain Onset  1 to 4 weeks ago    Pain Frequency  Intermittent    Aggravating Factors   Using arm    Pain Relieving Factors  Rest         [facility] PT Assessment - 12/12/18 0001      Assessment   Medical Diagnosis  s/p right lumpectomy and SLNB with bil reduction and lift    Referring Provider (PT)  Dr. Autumn Messing    Onset Date/Surgical Date  11/21/18    Hand Dominance  Right    Prior Therapy  Baselines      Precautions   Precautions  Other (comment)    Precaution Comments  Recent surgery; right arm lymphedema risk      Restrictions   Weight  Bearing Restrictions  No      Balance Screen   Has the patient fallen in the past 6 months  No    Has the patient had a decrease in activity level because of a fear of falling?   No    Is the patient reluctant to leave their home because of a fear of falling?   No      Home Environment   Living Environment  Private residence    Living Arrangements  Spouse/significant other;Children   Husband and 63 month old   Available Help at Discharge  Family      Prior Function   Level of Independence  Independent    Vocation  Full time employment    Equities trader    Leisure  She is not exercising      Cognition   Overall Cognitive Status  Within Functional Limits for tasks assessed      Observation/Other Assessments   Observations  Incisions on bilateral breasts appear to be healing well.  Axillary incision is well healed. Some mild edema present in right breast.      Posture/Postural Control   Posture/Postural Control  Postural limitations    Postural Limitations  Rounded Shoulders;Forward head      ROM / Strength   AROM / PROM / Strength  AROM      AROM   AROM Assessment Site  Shoulder    Right/Left Shoulder  Right    Right Shoulder Extension  44 Degrees    Right Shoulder Flexion  123 Degrees    Right Shoulder ABduction  111 Degrees    Right Shoulder Internal Rotation  60 Degrees    Right Shoulder External Rotation  75 Degrees    Left Shoulder Extension  50 Degrees    Left Shoulder Flexion  151 Degrees    Left Shoulder ABduction  167 Degrees    Left Shoulder Internal Rotation  79 Degrees    Left Shoulder External Rotation  83 Degrees        LYMPHEDEMA/ONCOLOGY QUESTIONNAIRE - 12/12/18 1417      Type   Cancer Type  Right breast cancer      Surgeries   Lumpectomy Date  11/21/18    Sentinel Lymph Node Biopsy Date  11/21/18    Other Surgery Date  11/21/18   Reduction and lift bilaterally   Number Lymph Nodes Removed  4   1 positive     Treatment   Active Chemotherapy Treatment  No   Begins end of December   Past Chemotherapy Treatment  No    Active Radiation Treatment  No    Past Radiation Treatment  No    Current Hormone Treatment  No    Past Hormone Therapy  No      What other symptoms do you have   Are you Having Heaviness or Tightness  Yes    Are you having Pain  Yes    Are you having pitting edema  No    Is it Hard or Difficult finding clothes that fit  No    Do you have infections  No    Is there Decreased scar mobility  Yes    Stemmer Sign  No      Lymphedema Assessments   Lymphedema Assessments  Upper extremities      Right Upper Extremity Lymphedema   10 cm Proximal to Olecranon Process  30.3 cm    Olecranon Process  25.5 cm  10 cm Proximal to Ulnar Styloid Process  22.6 cm    Just Proximal to Ulnar Styloid Process  16.5 cm     Across Hand at [employer]  17.8 cm    At [place] of 2nd Digit  5.9 cm      Left Upper Extremity Lymphedema   10 cm Proximal to Olecranon Process  31.9 cm    Olecranon Process  25.3 cm    10 cm Proximal to Ulnar Styloid Process  22 cm    Just Proximal to Ulnar Styloid Process  16.6 cm    Across Hand at [employer]  17.4 cm    At [place] of 2nd Digit  5.8 cm        Quick Dash - 12/12/18 0001    Open a tight or new jar  Mild difficulty    Do heavy household chores (wash walls, wash floors)  Moderate difficulty    Carry a shopping bag or briefcase  Mild difficulty    Wash your back  Severe difficulty    Use a knife to cut food  No difficulty    Recreational activities in which you take some force or impact through your arm, shoulder, or hand (golf, hammering, tennis)  Mild difficulty    During the past week, to what extent has your arm, shoulder or hand problem interfered with your normal social activities with family, friends, neighbors, or groups?  Modererately    During the past week, to what extent has your arm, shoulder or hand problem limited your work or other regular daily activities  Modererately    Arm, shoulder, or hand pain.  Mild    Tingling (pins and needles) in your arm, shoulder, or hand  None    Difficulty Sleeping  No difficulty    DASH Score  29.55 %                     PT Education - 12/12/18 1437    Education Details  Shoulder closed chain exercises    Person(s) Educated  Patient    Methods  Explanation;Demonstration    Comprehension  Returned demonstration;Verbalized understanding          PT Long Term Goals - 12/12/18 1504      PT LONG TERM GOAL #1   Title  Patient will demonstrate she has regained full shoulder ROM and function post operatively compared to baselines.    Time  8    Period  Weeks    Status  On-going            Plan - 12/12/18 1439    Clinical Impression Statement  Patient is doing well s/p right lumpectomy  and SLNB with bilateral reductions and lifts. She had 1/4 axillary lymph nodes positive. She had a high Mammaprint score so she will begin chamotherapy 01/05/2019. She will then undergo radiation and anti-estrogen therapy. She has very limited right shoulder flexion and abduction but no signs of lymphedema. 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 She will call me by 02/05/2019 if she feels she needs to come to PT.    Rehab Potential  Excellent    PT Frequency  2x / week    PT Duration  4 weeks   If she wants to do PT after  trying her HEP for a few weeks   PT Treatment/Interventions  ADLs/Self Care Home Management;Therapeutic exercise;Patient/family education;Manual techniques;Passive range of motion;Scar mobilization;Therapeutic activities    PT Next Visit Plan  Will reassess in 4 weeks to determine if shoulder ROM has improved and if not, we will begin PT. She wants to see how she does and schedule an appointment if needed. If she doesn't call by 02/05/2019, we will D/C.    PT Home Exercise Plan  Post op shoulder ROM HEP    Consulted and Agree with Plan of Care  Patient       Patient will benefit from skilled therapeutic intervention in order to improve the following deficits and impairments:  Postural dysfunction, Decreased range of motion, Pain, Impaired UE functional use, Decreased knowledge of precautions, Increased edema, Decreased scar mobility  Visit Diagnosis: Malignant neoplasm of upper-outer quadrant of right breast in female, estrogen receptor positive ([place]) - Plan: PT plan of care cert/re-cert  Abnormal posture - Plan: PT plan of care cert/re-cert  Aftercare following surgery for neoplasm - Plan: PT plan of care cert/re-cert  Stiffness of right shoulder, not elsewhere classified - Plan: PT plan of care cert/re-cert  Localized  edema - Plan: PT plan of care cert/re-cert     Problem List Patient Active Problem List   Diagnosis Date Noted  . Genetic testing 10/13/2018  . Family history of breast cancer   . Malignant neoplasm of upper-outer quadrant of right breast in female, estrogen receptor positive ([place]) 09/29/2018  . Gestational hypertension 06/18/2017  . Pregnancy 06/18/2017  . Low back pain 01/14/2013  . ADD (attention deficit disorder) 03/24/2012  . Overweight (BMI 25.0-29.9) 01/14/2012    Annia Friendly, PT 12/12/18 3:07 PM   [place], [place], [address] Phone: [phone]   Fax:  [phone]  Name: Kaylee Romero MRN: [id number] Date of Birth: 07-10-1980   PHYSICAL THERAPY DISCHARGE SUMMARY  Visits from Start of Care: 2  Current functional level related to goals / functional outcomes: See above for objective findings on 12/12/2018.   Remaining deficits: Patient was to contact PT by 02/05/2019 if she felt she needed to return for therapy. She has not called so will D/C at this time.   Education / Equipment: HEP and lymphedema risk reduction.  Plan: Patient agrees to discharge.  Patient goals were partially met. Patient is being discharged due to being pleased with the current functional level.  ?????         Annia Friendly, [place] 03/02/19 11:05 AM

## 2018-12-20 ENCOUNTER — Telehealth: Payer: Self-pay | Admitting: *Deleted

## 2018-12-20 DIAGNOSIS — C50411 Malignant neoplasm of upper-outer quadrant of right female breast: Secondary | ICD-10-CM

## 2018-12-20 DIAGNOSIS — Z170 Estrogen receptor positive status [ER+]: Secondary | ICD-10-CM

## 2018-12-20 NOTE — Telephone Encounter (Signed)
12/20/18 at 5:02pm - Research nurse emailed the pt the UpBeat consent form on 12/07/18.  The pt wanted to learn more about the study, and she agreed to read over the consent form.  The research nurse called the pt on 12/15/18 and left a message asking the pt to contact the nurse to discuss the study and her participation.  The pt did not return the nurse's call.  The research nurse contacted the pt again on 12/20/18 to discuss the study and answer all of the pt's questions about the study.  The pt was not available today by phone.  The research nurse left a voice message asking the pt to call the research nurse so her study participation could be discussed.  Will await the pt's return call.   Brion Aliment RN, BSN, CCRP Clinical Research Nurse 12/20/2018 5:12 PM

## 2018-12-22 ENCOUNTER — Encounter: Payer: Self-pay | Admitting: Surgical

## 2018-12-22 ENCOUNTER — Ambulatory Visit (INDEPENDENT_AMBULATORY_CARE_PROVIDER_SITE_OTHER): Payer: BC Managed Care – PPO | Admitting: Surgical

## 2018-12-22 ENCOUNTER — Other Ambulatory Visit: Payer: Self-pay

## 2018-12-22 VITALS — BP 131/85 | HR 98 | Temp 97.8°F | Ht 64.0 in | Wt 176.6 lb

## 2018-12-22 DIAGNOSIS — Z9889 Other specified postprocedural states: Secondary | ICD-10-CM

## 2018-12-22 DIAGNOSIS — C50411 Malignant neoplasm of upper-outer quadrant of right female breast: Secondary | ICD-10-CM

## 2018-12-22 DIAGNOSIS — Z170 Estrogen receptor positive status [ER+]: Secondary | ICD-10-CM

## 2018-12-22 MED ORDER — TRIAMCINOLONE ACETONIDE 0.025 % EX OINT: 1.0000 "application " | TOPICAL_OINTMENT | Freq: Two times a day (BID) | CUTANEOUS | 0 refills | Status: DC

## 2018-12-22 NOTE — Addendum Note (Signed)
Addended byRoetta Sessions on: 12/22/2018 04:31 PM   Modules accepted: Orders

## 2018-12-22 NOTE — Progress Notes (Signed)
Subjective:     Patient ID: Kaylee Romero, female    DOB: 13-Sep-1980, 38 y.o.   MRN: [id number]  Chief Complaint  Patient presents with  . Follow-up    left breast asymetry along with left breast reduction    HPI: The patient is a 38 y.o. female here for follow-up after partial mastectomy of the right breast and reductions bilaterally.   Since her last visit she has seen oncology and they are going to plan for chemotherapy, cyclophosphamide, doxorubicin, paclitaxel.  According to the December 05, 2018 oncology note they are planning to start January 05, 2019.  She reports she is going to have chemotherapy for 5 months and then radiation for 6 weeks after that.  She had some questions about NAC size difference today, this may change with radiation.   She has been wearing sports bra 24/7.  She does not feel as if it is supportive enough.  Her incisions are healing really well, C/D/I, no surrounding erythema, no drainage, no sign of infection.  She reports that she developed a rash recently in that distribution from her neck to inframammary fold and across her upper torso.  It appears to be in the distribution of chlorhexidine prep.  There are some maculopapular spots distributed around her torso.  No fever chills nausea vomiting.  Review of Systems  Constitutional: Negative for activity change, appetite change, chills, diaphoresis, fatigue and fever.  Respiratory: Negative for shortness of breath.   Cardiovascular: Negative for chest pain.  Gastrointestinal: Negative.   Musculoskeletal: Negative.   Skin: Positive for color change and rash. Negative for wound.     Objective:   Vital Signs BP 131/85 (BP Location: Left Arm, Patient Position: Sitting, Cuff Size: Small)   Pulse 98   Temp 97.8 F (36.6 C) (Temporal)   Ht 5\' 4"  (1.626 m)   Wt 176 lb 9.6 oz (80.1 kg)   SpO2 99%   BMI 30.31 kg/m  Vital Signs and Nursing Note Reviewed Chaperone present Physical Exam    Constitutional: She is oriented to person, place, and time and well-developed, well-nourished, and in no distress.  HENT:  Head: Normocephalic and atraumatic.  Cardiovascular: Normal rate.  Pulmonary/Chest: Effort normal.    Musculoskeletal:        General: Normal range of motion.  Neurological: She is alert and oriented to person, place, and time. Gait normal.  Skin: Skin is warm and dry. Rash (Torso) noted. She is not diaphoretic. No erythema. No pallor.  Psychiatric: Mood and affect normal.      Assessment/Plan:     ICD-10-CM   1. Malignant neoplasm of upper-outer quadrant of right breast in female, estrogen receptor positive ([place])  C50.411    Z17.0   2. S/P breast reconstruction  Z98.890     Recommend continuing wearing sports bra 24/7 for 2 more weeks.  She can then transition into wearing sports bra only during the day.  Recommend continue her sports bra for 3 to 6 months then she can transition into wearing a normal bra without an underwire.  Mederma cream in 1 to 2 weeks, make sure all incisions are closed and there is no wound at that time.  Do not use Mederma on port site incision.  She is scheduled to begin chemotherapy in approximately 15 days, call with any questions or concerns in regards to wound healing with chemotherapy.  Her incisions are healing really well.  In regards to radiation there may be some size change  on the right due to radiation, we can adjust and revise left breast size to match in the future if necessary.  Follow-up in 2 to 3 months for reevaluation.  Call with any questions or concerns prior to then.   Carola Rhine Dreon Pineda, PA-C 12/22/2018, 4:09 PM

## 2018-12-27 ENCOUNTER — Telehealth: Payer: Self-pay | Admitting: *Deleted

## 2018-12-27 NOTE — Telephone Encounter (Signed)
12/27/2018 at [place] again attempted to reach the pt by phone to discuss her UpBEAT/ [id number] study participation.  [facility] MRI department has limited slots available for next week.  The research nurse was unable to speak to the pt today, but the nurse left a message asking the pt to call her if she wants to participate in the study so that the nurse could hold a slot open for her next week.  Will await pt's return call.   Brion Aliment RN, BSN, CCRP Clinical Research Nurse 12/27/2018 4:39 PM

## 2019-01-03 NOTE — Progress Notes (Signed)
[facility]  [phone] [phone]     ID: ORIYAH LAMPHEAR DOB: 1980-11-10  MR#: [id number]  WGY#:[id number]  Patient Care Team: Linda Hedges, DO as PCP - General (Obstetrics and Gynecology) Mauro Kaufmann, RN as Oncology Nurse Navigator Rockwell Germany, RN as Oncology Nurse Navigator Gery Pray, MD as Consulting Physician (Radiation Oncology) Noel Henandez, Virgie Dad, MD as Consulting Physician (Oncology) Jovita Kussmaul, MD as Consulting Physician (General Surgery) Dillingham, Loel Lofty, DO as Attending Physician (Plastic Surgery) Chauncey Cruel, MD OTHER MD:  CHIEF COMPLAINT: estrogen receptor positive breast cancer  CURRENT TREATMENT: Adjuvant chemotherapy   INTERVAL HISTORY: Kaylee Romero  returns today for follow-up of her estrogen receptor positive breast cancer.   She is scheduled to begin adjuvant chemotherapy tomorrow. This will consist of cyclophosphamide and doxorubicin in dose dense fashion x4 followed by weekly paclitaxel x12.  Her most recent echocardiogram on 10/27/2018 showed an ejection fraction of 55-60%.   REVIEW OF SYSTEMS: Adleigh enjoyed meeting with our chemotherapy teaching nurse and most of her questions were answered there.  She had a quiet Christmas test with immediate family.  She has been feeling a little bit "tight" meaning her muscles feel tight and she is feeling a little bit anxious.  This is understandable.  A detailed review of systems today was otherwise noncontributory   HISTORY OF CURRENT ILLNESS: From the original intake note:  Kaylee Romero (pronounced "Bram-Hall") found a palpable lump in the 12 o'clock position of the right breast, which he brought to the attention of her gynecologist.. She underwent bilateral diagnostic mammography with tomography and bilateral breast ultrasonography at [facility] on 09/23/2018 showing: breast density category C; a spiculated mass with associated with architectural  distortion and pleomorphic calcifications lying in the 11-12 o'clock position, middle depth, measuring 2 cm in size, corresponding to the palpable abnormality.   Physical exam confirmed a firm but mobile mass in the 12 o'clock position of the right breast.  By ultrasound there was a highly suspicious 2.1 cm mass in the 12 o'clock position of the right breast; no other evidence of breast malignancy or lymphadenopathy bilaterally.Sonographic evaluation of the right axilla found no enlarged or abnormal appearing lymph node  In the left axilla there were several lymph nodes with areas of cortical prominence similar to the lymph node noted on the right.  Given the symmetry all these are presumed to be reactive  Accordingly on 09/26/2018 she proceeded to biopsy of the right breast area in question. The pathology from this procedure ([id number]) showed: invasive ductal carcinoma, grade 2; prognostic indicators significant for: estrogen receptor, 80% positive with moderate staining intensity and progesterone receptor, 90% positive with strong staining intensity. Proliferation marker Ki67 at 35%. HER2 negative by immunohistochemistry (1+).  The patient's subsequent history is as detailed below.   PAST MEDICAL HISTORY: Past Medical History:  Diagnosis Date  . Anxiety   . Family history of breast cancer   . HSV (herpes simplex virus) anogenital infection    positive titer only    PAST SURGICAL HISTORY: Past Surgical History:  Procedure Laterality Date  . BREAST LUMPECTOMY WITH AXILLARY LYMPH NODE BIOPSY Right 11/21/2018   Procedure: RIGHT BREAST LUMPECTOMY WITH SENTINEL LYMPH NODE BIOPSY;  Surgeon: Jovita Kussmaul, MD;  Location: [facility];  Service: General;  Laterality: Right;  . BREAST REDUCTION SURGERY Bilateral 11/21/2018   Procedure: BILATERAL MAMMARY REDUCTION  (BREAST);  Surgeon: Wallace Going, DO;  Location: [facility];  Service:  Plastics;  Laterality: Bilateral;  . PORTACATH PLACEMENT N/A  11/21/2018   Procedure: INSERTION LEFT PORT-A-CATH WITH ULTRASOUND GUIDANCE;  Surgeon: Jovita Kussmaul, MD;  Location: [facility];  Service: General;  Laterality: N/A;  . TONSILLECTOMY  1987  . TONSILLECTOMY    . WISDOM TOOTH EXTRACTION      FAMILY HISTORY: Family History  Problem Relation Age of Onset  . Hypertension Father   . Alcohol abuse Paternal Grandfather   . Heart disease Paternal Grandfather   . Alcohol abuse Maternal Grandfather   . Heart disease Maternal Grandmother   . Breast cancer Other    Patient's parents are both living at age 70, as of 09/2018. Her father lives in [place], and her mother lives in [place], [place]. The patient denies a family hx of ovarian cancer. A maternal great aunt was diagnosed with breast cancer twice. She has 2 brothers, no sisters   GYNECOLOGIC HISTORY:  No LMP recorded. (Menstrual status: Chemotherapy). Menarche: 38 years old Age at first live birth: 38 years old [place] P 1 Currently having regular periods  Contraceptive: Mirena IUD in place HRT n/a  Hysterectomy? no BSO? 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 Husband Tommi Rumps is in management with [employer]. [name] is 69 months old.     ADVANCED DIRECTIVES: not in place. Her husband is automatically her HCPOA.   HEALTH MAINTENANCE: Social History   Tobacco Use  . Smoking status: Former Smoker    Packs/day: 0.50    Years: 10.00    Pack years: 5.00    Types: Cigarettes    Start date: 01/13/2002    Quit date: 10/17/2016    Years since quitting: 2.2  . Smokeless tobacco: Never Used  Substance Use Topics  . Alcohol use: Yes    Comment: Socially  . Drug use: No     Colonoscopy: n/a  PAP: 09/20/2018  Bone density: n/a   Allergies  Allergen Reactions  . Chlorhexidine Gluconate Rash    Current Outpatient Medications  Medication Sig Dispense Refill  . acetaminophen (TYLENOL) 500 MG tablet Take 1 tablet (500 mg total) by mouth every 6  (six) hours as needed. 60 tablet 0  . amphetamine-dextroamphetamine (ADDERALL XR) 20 MG 24 hr capsule Take 20 mg by mouth daily.    . Ascorbic Acid (VITAMIN C) 1000 MG tablet Take 1,000 mg by mouth daily.    . citalopram (CELEXA) 20 MG tablet Take 20 mg by mouth at bedtime.     . clobetasol cream (TEMOVATE) 7.10 % Apply 1 application topically daily.    Marland Kitchen dexamethasone (DECADRON) 4 MG tablet Take 2 tablets by mouth once a day on the day after chemotherapy and then take 2 tablets two times a day for 2 days. Take with food. 30 tablet 1  . hydrOXYzine (ATARAX/VISTARIL) 10 MG tablet Take 10 mg by mouth at bedtime.     . lidocaine-prilocaine (EMLA) cream Apply to affected area once 30 g 3  . loratadine (CLARITIN) 10 MG tablet Take 1 tablet (10 mg total) by mouth daily. 60 tablet 3  . LORazepam (ATIVAN) 0.5 MG tablet Take 1 tablet (0.5 mg total) by mouth at bedtime as needed (Nausea or vomiting). 30 tablet 0  . Multiple Vitamin (MULTIVITAMIN WITH MINERALS) TABS tablet Take 1 tablet by mouth daily.    . naproxen sodium (ALEVE) 220 MG tablet Take 1 tablet (220 mg total) by mouth 3 (three) times daily with meals. 60 tablet 3  .  prochlorperazine (COMPAZINE) 10 MG tablet Take 1 tablet (10 mg total) by mouth every 6 (six) hours as needed (Nausea or vomiting). 30 tablet 1  . Tetrahydrozoline HCl (VISINE OP) Place 1 drop into both eyes daily as needed (redness).    . triamcinolone (KENALOG) 0.025 % ointment Apply 1 application topically 2 (two) times daily. 30 g 0  . vitamin B-12 (CYANOCOBALAMIN) 1000 MCG tablet Take 1,000 mcg by mouth daily.     No current facility-administered medications for this visit.   Facility-Administered Medications Ordered in Other Visits  Medication Dose Route Frequency Provider Last Rate Last Admin  . cyclophosphamide (CYTOXAN) 1,140 mg in sodium chloride 0.9 % 250 mL chemo infusion  600 mg/m2 (Treatment Plan Recorded) Intravenous Once Treshaun Carrico, Virgie Dad, MD      . DOXOrubicin  (ADRIAMYCIN) chemo injection 114 mg  60 mg/m2 (Treatment Plan Recorded) Intravenous Once Gertude Benito, Virgie Dad, MD      . fosaprepitant (EMEND) 150 mg, dexamethasone (DECADRON) 12 mg in sodium chloride 0.9 % 145 mL IVPB   Intravenous Once Santosha Jividen, Virgie Dad, MD      . heparin lock flush 100 unit/mL  500 Units Intracatheter Once PRN Keniya Schlotterbeck, Virgie Dad, MD      . sodium chloride flush (NS) 0.9 % injection 10 mL  10 mL Intracatheter PRN Dalena Plantz, Virgie Dad, MD        OBJECTIVE: Young white woman in no acute distress  Vitals:   01/04/19 1330  BP: (!) 125/92  Pulse: (!) 106  Resp: 18  Temp: 98.3 F (36.8 C)  SpO2: 100%   Wt Readings from Last 3 Encounters:  01/04/19 179 lb 9.6 oz (81.5 kg)  12/22/18 176 lb 9.6 oz (80.1 kg)  12/05/18 178 lb 6.4 oz (80.9 kg)   Body mass index is 30.83 kg/m.    ECOG FS:1 - Symptomatic but completely ambulatory  Sclerae unicteric, EOMs intact Wearing a mask No cervical or supraclavicular adenopathy Lungs no rales or rhonchi Heart regular rate and rhythm Abd soft, nontender, positive bowel sounds MSK no focal spinal tenderness, no upper extremity lymphedema Neuro: nonfocal, well oriented, appropriate affect Breasts: Status post right lumpectomy and reduction mammoplasty.  The right breast is healing nicely.  It is minimally larger than the left at present.  The left breast is status post reduction mammoplasty.  It is otherwise unremarkable.  Both axillae are benign.   LAB RESULTS:  CMP     Component Value Date/Time   NA 140 01/05/2019 0815   K 3.9 01/05/2019 0815   CL 106 01/05/2019 0815   CO2 24 01/05/2019 0815   GLUCOSE 113 (H) 01/05/2019 0815   BUN 12 01/05/2019 0815   CREATININE 0.64 01/05/2019 0815   CREATININE 0.68 10/05/2018 1244   CALCIUM 8.5 (L) 01/05/2019 0815   PROT 6.5 01/05/2019 0815   ALBUMIN 3.8 01/05/2019 0815   AST 16 01/05/2019 0815   AST 15 10/05/2018 1244   ALT 23 01/05/2019 0815   ALT 18 10/05/2018 1244   ALKPHOS 49  01/05/2019 0815   BILITOT 0.3 01/05/2019 0815   BILITOT 0.5 10/05/2018 1244   GFRNONAA >60 01/05/2019 0815   GFRNONAA >60 10/05/2018 1244   GFRAA >60 01/05/2019 0815   GFRAA >60 10/05/2018 1244    No results found for: TOTALPROTELP, ALBUMINELP, A1GS, A2GS, BETS, BETA2SER, GAMS, MSPIKE, SPEI  No results found for: KPAFRELGTCHN, LAMBDASER, KAPLAMBRATIO  Lab Results  Component Value Date   WBC 6.7 01/05/2019   NEUTROABS 4.0 01/05/2019  HGB 12.9 01/05/2019   HCT 37.6 01/05/2019   MCV 90.8 01/05/2019   PLT 232 01/05/2019    No results found for: LABCA2  No components found for: ALPFXT024  No results for input(s): INR in the last 168 hours.  No results found for: LABCA2  No results found for: OXB353  No results found for: GDJ242  No results found for: AST419  No results found for: CA2729  No components found for: HGQUANT  No results found for: CEA1 / No results found for: CEA1   No results found for: AFPTUMOR  No results found for: CHROMOGRNA  No results found for: HGBA, HGBA2QUANT, HGBFQUANT, HGBSQUAN (Hemoglobinopathy evaluation)   No results found for: LDH  No results found for: IRON, TIBC, IRONPCTSAT (Iron and TIBC)  No results found for: FERRITIN  Urinalysis    Component Value Date/Time   BILIRUBINUR Neg 01/18/2012 1458   PROTEINUR Neg 01/18/2012 1458   UROBILINOGEN 0.2 01/18/2012 1458   NITRITE Neg 01/18/2012 1458   LEUKOCYTESUR Negative 01/18/2012 1458     STUDIES: No results found.  ELIGIBLE FOR AVAILABLE RESEARCH PROTOCOL: no  ASSESSMENT: 38 y.o. [place] woman status post right breast upper inner quadrant biopsy on 09/26/2018 for a clinical T2 N0, stage IB invasive ductal carcinoma, grade 2, estrogen and progesterone receptor positive, HER-2 nonamplified, with an MIB-1-1 of 35%  (1) Genetic testing reported on 10/05/2018 through the [facility] Breast Cancer STAT Panel + Common Hereditary cancer panel found no pathogenic mutations. The STAT  Breast cancer panel offered by [facility] includes sequencing and rearrangement analysis for the following 9 genes:  ATM, BRCA1, BRCA2, CDH1, CHEK2, PALB2, PTEN, STK11 and TP53. The Common Hereditary Cancers Panel offered by [facility] includes sequencing and/or deletion duplication testing of the following 47 genes: APC, ATM, AXIN2, BARD1, BMPR1A, BRCA1, BRCA2, BRIP1, CDH1, CDKN2A (p14ARF), CDKN2A (p16INK4a), CKD4, CHEK2, CTNNA1, DICER1, EPCAM (Deletion/duplication testing only), GREM1 (promoter region deletion/duplication testing only), KIT, MEN1, MLH1, MSH2, MSH3, MSH6, MUTYH, NBN, NF1, NHTL1, PALB2, PDGFRA, PMS2, POLD1, POLE, PTEN, RAD50, RAD51C, RAD51D, SDHB, SDHC, SDHD, SMAD4, SMARCA4. STK11, TP53, TSC1, TSC2, and VHL.  The following genes were evaluated for sequence changes only: SDHA and HOXB13 c.251G>A variant only.   (2) MammaPrint obtained from the original breast biopsy returned "high risk" indicating the patient will have an excellent prognosis if she receives adjuvant chemotherapy  (3) status post right lumpectomy and right axillary sentinel lymph node sampling 11/21/2018 for a pT2 pN1, stage IIA invasive ductal carcinoma, grade 2, with negative margins  (a) a total of 4 sentinel lymph nodes removed, 1 with macrometastasis  (b) status post bilateral breast reductions at the time of lumpectomy  (4) adjuvant chemotherapy will consist of cyclophosphamide and doxorubicin in dose dense fashion x4 followed by weekly paclitaxel x12  (a) echo 10/27/2018 shows an ejection fraction in the 55-60% range  (5) adjuvant radiation to follow, in [place] per patient's request  (6) tamoxifen started 10/05/2018 in anticipation of surgical delays, discontinued 12/05/2018   PLAN: [place] appreciated meeting with our chemotherapy teaching nurse and certainly learned quite a bit from that exchange.  Nevertheless she still had some questions that we went over today.  She tells me she is pretty much sure that they  do not want to have anymore children.  She is not interested in receiving goserelin for fertility preservation.  This was discussed again today.  We reviewed all the medicines she will be taking for support and their possible side effects.  She had already  has everything on hand.  I asked her to keep a diary of symptoms when she returns to see me in a week we can troubleshoot more easily any problems that may develop.  She is pretty clear that she would like to receive her radiation in [place] so at some point we will refer her to Dr. Donella Stade there.  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   Vola Beneke, Virgie Dad, MD  01/05/19 9:45 AM Medical Oncology and Hematology [facility] [place],  [address] Tel. [phone]    Fax. [phone]   I, Wilburn Mylar, am acting as scribe for Dr. Virgie Dad. Octavis Sheeler.  I, Lurline Del MD, have reviewed the above documentation for accuracy and completeness, and I agree with the above.

## 2019-01-04 ENCOUNTER — Other Ambulatory Visit: Payer: Self-pay

## 2019-01-04 ENCOUNTER — Encounter: Payer: Self-pay | Admitting: Oncology

## 2019-01-04 ENCOUNTER — Inpatient Hospital Stay: Payer: BC Managed Care – PPO | Attending: Oncology | Admitting: Oncology

## 2019-01-04 ENCOUNTER — Inpatient Hospital Stay: Payer: BC Managed Care – PPO

## 2019-01-04 VITALS — BP 125/92 | HR 106 | Temp 98.3°F | Resp 18 | Ht 64.0 in | Wt 179.6 lb

## 2019-01-04 DIAGNOSIS — C50811 Malignant neoplasm of overlapping sites of right female breast: Secondary | ICD-10-CM | POA: Insufficient documentation

## 2019-01-04 DIAGNOSIS — Z170 Estrogen receptor positive status [ER+]: Secondary | ICD-10-CM | POA: Insufficient documentation

## 2019-01-04 DIAGNOSIS — C50411 Malignant neoplasm of upper-outer quadrant of right female breast: Secondary | ICD-10-CM

## 2019-01-04 DIAGNOSIS — Z5111 Encounter for antineoplastic chemotherapy: Secondary | ICD-10-CM | POA: Insufficient documentation

## 2019-01-04 NOTE — Progress Notes (Signed)
Met with patient at registration to introduce myself as Arboriculturist and to offer available resources.  Discussed one-time $1000 Radio broadcast assistant to assist with personal expenses while going through treatment. Also, discussed copay assistance if needed for Neulasta.  Gave her my card if interested and for any additional financial questions or concerns.

## 2019-01-05 ENCOUNTER — Other Ambulatory Visit: Payer: Self-pay

## 2019-01-05 ENCOUNTER — Inpatient Hospital Stay: Payer: BC Managed Care – PPO

## 2019-01-05 ENCOUNTER — Encounter: Payer: Self-pay | Admitting: *Deleted

## 2019-01-05 VITALS — BP 136/79 | HR 80 | Temp 97.8°F | Resp 17

## 2019-01-05 DIAGNOSIS — C50411 Malignant neoplasm of upper-outer quadrant of right female breast: Secondary | ICD-10-CM

## 2019-01-05 DIAGNOSIS — C50811 Malignant neoplasm of overlapping sites of right female breast: Secondary | ICD-10-CM | POA: Diagnosis present

## 2019-01-05 DIAGNOSIS — Z95828 Presence of other vascular implants and grafts: Secondary | ICD-10-CM

## 2019-01-05 DIAGNOSIS — Z170 Estrogen receptor positive status [ER+]: Secondary | ICD-10-CM

## 2019-01-05 DIAGNOSIS — Z5111 Encounter for antineoplastic chemotherapy: Secondary | ICD-10-CM | POA: Diagnosis not present

## 2019-01-05 LAB — CBC WITH DIFFERENTIAL/PLATELET
Abs Immature Granulocytes: 0.01 10*3/uL (ref 0.00–0.07)
Basophils Absolute: 0 10*3/uL (ref 0.0–0.1)
Basophils Relative: 1 %
Eosinophils Absolute: 0.3 10*3/uL (ref 0.0–0.5)
Eosinophils Relative: 4 %
HCT: 37.6 % (ref 36.0–46.0)
Hemoglobin: 12.9 g/dL (ref 12.0–15.0)
Immature Granulocytes: 0 %
Lymphocytes Relative: 29 %
Lymphs Abs: 1.9 10*3/uL (ref 0.7–4.0)
MCH: 31.2 pg (ref 26.0–34.0)
MCHC: 34.3 g/dL (ref 30.0–36.0)
MCV: 90.8 fL (ref 80.0–100.0)
Monocytes Absolute: 0.4 10*3/uL (ref 0.1–1.0)
Monocytes Relative: 6 %
Neutro Abs: 4 10*3/uL (ref 1.7–7.7)
Neutrophils Relative %: 60 %
Platelets: 232 10*3/uL (ref 150–400)
RBC: 4.14 MIL/uL (ref 3.87–5.11)
RDW: 11.9 % (ref 11.5–15.5)
WBC: 6.7 10*3/uL (ref 4.0–10.5)
nRBC: 0 % (ref 0.0–0.2)

## 2019-01-05 LAB — COMPREHENSIVE METABOLIC PANEL
ALT: 23 U/L (ref 0–44)
AST: 16 U/L (ref 15–41)
Albumin: 3.8 g/dL (ref 3.5–5.0)
Alkaline Phosphatase: 49 U/L (ref 38–126)
Anion gap: 10 (ref 5–15)
BUN: 12 mg/dL (ref 6–20)
CO2: 24 mmol/L (ref 22–32)
Calcium: 8.5 mg/dL — ABNORMAL LOW (ref 8.9–10.3)
Chloride: 106 mmol/L (ref 98–111)
Creatinine, Ser: 0.64 mg/dL (ref 0.44–1.00)
GFR calc Af Amer: >60 mL/min (ref >60)
GFR calc non Af Amer: >60 mL/min (ref >60)
Glucose, Bld: 113 mg/dL — ABNORMAL HIGH (ref 70–99)
Potassium: 3.9 mmol/L (ref 3.5–5.1)
Sodium: 140 mmol/L (ref 135–145)
Total Bilirubin: 0.3 mg/dL (ref 0.3–1.2)
Total Protein: 6.5 g/dL (ref 6.5–8.1)

## 2019-01-05 MED ORDER — DOXORUBICIN HCL CHEMO IV INJECTION 2 MG/ML
60.0000 mg/m2 | Freq: Once | INTRAVENOUS | Status: AC
  Administered 2019-01-05: 114 mg via INTRAVENOUS
  Filled 2019-01-05: qty 57

## 2019-01-05 MED ORDER — PALONOSETRON HCL INJECTION 0.25 MG/5ML
INTRAVENOUS | Status: AC
  Filled 2019-01-05: qty 5

## 2019-01-05 MED ORDER — PEGFILGRASTIM 6 MG/0.6ML ~~LOC~~ PSKT: 6.0000 mg | PREFILLED_SYRINGE | Freq: Once | SUBCUTANEOUS | Status: DC

## 2019-01-05 MED ORDER — SODIUM CHLORIDE 0.9 % IV SOLN
Freq: Once | INTRAVENOUS | Status: AC
  Filled 2019-01-05: qty 250

## 2019-01-05 MED ORDER — SODIUM CHLORIDE 0.9% FLUSH
10.0000 mL | INTRAVENOUS | Status: DC | PRN
  Administered 2019-01-05: 10 mL
  Filled 2019-01-05: qty 10

## 2019-01-05 MED ORDER — SODIUM CHLORIDE 0.9 % IV SOLN
Freq: Once | INTRAVENOUS | Status: AC
  Filled 2019-01-05: qty 5

## 2019-01-05 MED ORDER — PALONOSETRON HCL INJECTION 0.25 MG/5ML
0.2500 mg | Freq: Once | INTRAVENOUS | Status: AC
  Administered 2019-01-05: 0.25 mg via INTRAVENOUS

## 2019-01-05 MED ORDER — HEPARIN SOD (PORK) LOCK FLUSH 100 UNIT/ML IV SOLN
500.0000 [IU] | Freq: Once | INTRAVENOUS | Status: AC | PRN
  Administered 2019-01-05: 500 [IU]
  Filled 2019-01-05: qty 5

## 2019-01-05 MED ORDER — SODIUM CHLORIDE 0.9% FLUSH
10.0000 mL | INTRAVENOUS | Status: DC | PRN
  Administered 2019-01-05: 10 mL via INTRAVENOUS
  Filled 2019-01-05: qty 10

## 2019-01-05 MED ORDER — SODIUM CHLORIDE 0.9 % IV SOLN
600.0000 mg/m2 | Freq: Once | INTRAVENOUS | Status: AC
  Administered 2019-01-05: 1140 mg via INTRAVENOUS
  Filled 2019-01-05: qty 57

## 2019-01-05 NOTE — Patient Instructions (Signed)

## 2019-01-05 NOTE — Patient Instructions (Signed)
[facility] Discharge Instructions for Patients Receiving Chemotherapy  Today you received the following chemotherapy agents: Doxorubicin (Adriamycin) and Cyclophosphamide (Cytoxan)  To help prevent nausea and vomiting after your treatment, we encourage you to take your nausea medication as directed.   If you develop nausea and vomiting that is not controlled by your nausea medication, call the clinic.   BELOW ARE SYMPTOMS THAT SHOULD BE REPORTED IMMEDIATELY:  *FEVER GREATER THAN 100.5 F  *CHILLS WITH OR WITHOUT FEVER  NAUSEA AND VOMITING THAT IS NOT CONTROLLED WITH YOUR NAUSEA MEDICATION  *UNUSUAL SHORTNESS OF BREATH  *UNUSUAL BRUISING OR BLEEDING  TENDERNESS IN MOUTH AND THROAT WITH OR WITHOUT PRESENCE OF ULCERS  *URINARY PROBLEMS  *BOWEL PROBLEMS  UNUSUAL RASH Items with * indicate a potential emergency and should be followed up as soon as possible.  Feel free to call the clinic should you have any questions or concerns. The clinic phone number is [phone].  Please show the [facility] at check-in to the Emergency Department and triage nurse.  Doxorubicin injection What is this medicine? DOXORUBICIN (dox oh ROO bi sin) is a chemotherapy drug. It is used to treat many kinds of cancer like leukemia, lymphoma, neuroblastoma, sarcoma, and Wilms' tumor. It is also used to treat bladder cancer, breast cancer, lung cancer, ovarian cancer, stomach cancer, and thyroid cancer. This medicine may be used for other purposes; ask your health care provider or pharmacist if you have questions. COMMON BRAND NAME(S): Adriamycin, Adriamycin PFS, Adriamycin RDF, Rubex What should I tell my health care provider before I take this medicine? They need to know if you have any of these conditions:  heart disease  history of low blood counts caused by a medicine  liver disease  recent or ongoing radiation therapy  an unusual or allergic reaction to doxorubicin,  other chemotherapy agents, other medicines, foods, dyes, or preservatives  pregnant or trying to get pregnant  breast-feeding How should I use this medicine? This drug is given as an infusion into a vein. It is administered in a hospital or clinic by a specially trained health care professional. If you have pain, swelling, burning or any unusual feeling around the site of your injection, tell your health care professional right away. Talk to your pediatrician regarding the use of this medicine in children. Special care may be needed. Overdosage: If you think you have taken too much of this medicine contact a poison control center or emergency room at once. NOTE: This medicine is only for you. Do not share this medicine with others. What if I miss a dose? It is important not to miss your dose. Call your doctor or health care professional if you are unable to keep an appointment. What may interact with this medicine? This medicine may interact with the following medications:  6-mercaptopurine  paclitaxel  phenytoin  St. John's Wort  trastuzumab  verapamil This list may not describe all possible interactions. Give your health care provider a list of all the medicines, herbs, non-prescription drugs, or dietary supplements you use. Also tell them if you smoke, drink alcohol, or use illegal drugs. Some items may interact with your medicine. What should I watch for while using this medicine? This drug may make you feel generally unwell. This is not uncommon, as chemotherapy can affect healthy cells as well as cancer cells. Report any side effects. Continue your course of treatment even though you feel ill unless your doctor tells you to stop. There is a  maximum amount of this medicine you should receive throughout your life. The amount depends on the medical condition being treated and your overall health. Your doctor will watch how much of this medicine you receive in your lifetime. Tell your  doctor if you have taken this medicine before. You may need blood work done while you are taking this medicine. Your urine may turn red for a few days after your dose. This is not blood. If your urine is dark or brown, call your doctor. In some cases, you may be given additional medicines to help with side effects. Follow all directions for their use. Call your doctor or health care professional for advice if you get a fever, chills or sore throat, or other symptoms of a cold or flu. Do not treat yourself. This drug decreases your body's ability to fight infections. Try to avoid being around people who are sick. This medicine may increase your risk to bruise or bleed. Call your doctor or health care professional if you notice any unusual bleeding. Talk to your doctor about your risk of cancer. You may be more at risk for certain types of cancers if you take this medicine. Do not become pregnant while taking this medicine or for 6 months after stopping it. Women should inform their doctor if they wish to become pregnant or think they might be pregnant. Men should not father a child while taking this medicine and for 6 months after stopping it. There is a potential for serious side effects to an unborn child. Talk to your health care professional or pharmacist for more information. Do not breast-feed an infant while taking this medicine. This medicine has caused ovarian failure in some women and reduced sperm counts in some men This medicine may interfere with the ability to have a child. Talk with your doctor or health care professional if you are concerned about your fertility. This medicine may cause a decrease in Co-Enzyme Q-10. You should make sure that you get enough Co-Enzyme Q-10 while you are taking this medicine. Discuss the foods you eat and the vitamins you take with your health care professional. What side effects may I notice from receiving this medicine? Side effects that you should report to  your doctor or health care professional as soon as possible:  allergic reactions like skin rash, itching or hives, swelling of the face, lips, or tongue  breathing problems  chest pain  fast or irregular heartbeat  low blood counts - this medicine may decrease the number of white blood cells, red blood cells and platelets. You may be at increased risk for infections and bleeding.  pain, redness, or irritation at site where injected  signs of infection - fever or chills, cough, sore throat, pain or difficulty passing urine  signs of decreased platelets or bleeding - bruising, pinpoint red spots on the skin, black, tarry stools, blood in the urine  swelling of the ankles, feet, hands  tiredness  weakness Side effects that usually do not require medical attention (report to your doctor or health care professional if they continue or are bothersome):  diarrhea  hair loss  mouth sores  nail discoloration or damage  nausea  red colored urine  vomiting This list may not describe all possible side effects. Call your doctor for medical advice about side effects. You may report side effects to FDA at 1-800-FDA-1088. Where should I keep my medicine? This drug is given in a hospital or clinic and will not be stored   at home. NOTE: This sheet is a summary. It may not cover all possible information. If you have questions about this medicine, talk to your doctor, pharmacist, or health care provider.  2020 Elsevier/Gold Standard (2016-08-05 11:01:26)  Cyclophosphamide Injection What is this medicine? CYCLOPHOSPHAMIDE (sye kloe FOSS fa mide) is a chemotherapy drug. It slows the growth of cancer cells. This medicine is used to treat many types of cancer like lymphoma, myeloma, leukemia, breast cancer, and ovarian cancer, to name a few. This medicine may be used for other purposes; ask your health care provider or pharmacist if you have questions. COMMON BRAND NAME(S): Cytoxan,  Neosar What should I tell my health care provider before I take this medicine? They need to know if you have any of these conditions:  heart disease  history of irregular heartbeat  infection  kidney disease  liver disease  low blood counts, like white cells, platelets, or red blood cells  on hemodialysis  recent or ongoing radiation therapy  scarring or thickening of the lungs  trouble passing urine  an unusual or allergic reaction to cyclophosphamide, other medicines, foods, dyes, or preservatives  pregnant or trying to get pregnant  breast-feeding How should I use this medicine? This drug is usually given as an injection into a vein or muscle or by infusion into a vein. It is administered in a hospital or clinic by a specially trained health care professional. Talk to your pediatrician regarding the use of this medicine in children. Special care may be needed. Overdosage: If you think you have taken too much of this medicine contact a poison control center or emergency room at once. NOTE: This medicine is only for you. Do not share this medicine with others. What if I miss a dose? It is important not to miss your dose. Call your doctor or health care professional if you are unable to keep an appointment. What may interact with this medicine?  amphotericin B  azathioprine  certain antivirals for HIV or hepatitis  certain medicines for blood pressure, heart disease, irregular heart beat  certain medicines that treat or prevent blood clots like warfarin  certain other medicines for cancer  cyclosporine  etanercept  indomethacin  medicines that relax muscles for surgery  medicines to increase blood counts  metronidazole This list may not describe all possible interactions. Give your health care provider a list of all the medicines, herbs, non-prescription drugs, or dietary supplements you use. Also tell them if you smoke, drink alcohol, or use illegal  drugs. Some items may interact with your medicine. What should I watch for while using this medicine? Your condition will be monitored carefully while you are receiving this medicine. You may need blood work done while you are taking this medicine. Drink water or other fluids as directed. Urinate often, even at night. Some products may contain alcohol. Ask your health care professional if this medicine contains alcohol. Be sure to tell all health care professionals you are taking this medicine. Certain medicines, like metronidazole and disulfiram, can cause an unpleasant reaction when taken with alcohol. The reaction includes flushing, headache, nausea, vomiting, sweating, and increased thirst. The reaction can last from 30 minutes to several hours. Do not become pregnant while taking this medicine or for 1 year after stopping it. Women should inform their health care professional if they wish to become pregnant or think they might be pregnant. Men should not father a child while taking this medicine and for 4 months after stopping it.  There is potential for serious side effects to an unborn child. Talk to your health care professional for more information. Do not breast-feed an infant while taking this medicine or for 1 week after stopping it. This medicine has caused ovarian failure in some women. This medicine may make it more difficult to get pregnant. Talk to your health care professional if you are concerned about your fertility. This medicine has caused decreased sperm counts in some men. This may make it more difficult to father a child. Talk to your health care professional if you are concerned about your fertility. Call your health care professional for advice if you get a fever, chills, or sore throat, or other symptoms of a cold or flu. Do not treat yourself. This medicine decreases your body's ability to fight infections. Try to avoid being around people who are sick. Avoid taking medicines  that contain aspirin, acetaminophen, ibuprofen, naproxen, or ketoprofen unless instructed by your health care professional. These medicines may hide a fever. Talk to your health care professional about your risk of cancer. You may be more at risk for certain types of cancer if you take this medicine. If you are going to need surgery or other procedure, tell your health care professional that you are using this medicine. Be careful brushing or flossing your teeth or using a toothpick because you may get an infection or bleed more easily. If you have any dental work done, tell your dentist you are receiving this medicine. What side effects may I notice from receiving this medicine? Side effects that you should report to your doctor or health care professional as soon as possible:  allergic reactions like skin rash, itching or hives, swelling of the face, lips, or tongue  breathing problems  nausea, vomiting  signs and symptoms of bleeding such as bloody or black, tarry stools; red or dark brown urine; spitting up blood or brown material that looks like coffee grounds; red spots on the skin; unusual bruising or bleeding from the eyes, gums, or nose  signs and symptoms of heart failure like fast, irregular heartbeat, sudden weight gain; swelling of the ankles, feet, hands  signs and symptoms of infection like fever; chills; cough; sore throat; pain or trouble passing urine  signs and symptoms of kidney injury like trouble passing urine or change in the amount of urine  signs and symptoms of liver injury like dark yellow or brown urine; general ill feeling or flu-like symptoms; light-colored stools; loss of appetite; nausea; right upper belly pain; unusually weak or tired; yellowing of the eyes or skin Side effects that usually do not require medical attention (report to your doctor or health care professional if they continue or are bothersome):  confusion  decreased  hearing  diarrhea  facial flushing  hair loss  headache  loss of appetite  missed menstrual periods  signs and symptoms of low red blood cells or anemia such as unusually weak or tired; feeling faint or lightheaded; falls  skin discoloration This list may not describe all possible side effects. Call your doctor for medical advice about side effects. You may report side effects to FDA at 1-800-FDA-1088. Where should I keep my medicine? This drug is given in a hospital or clinic and will not be stored at home. NOTE: This sheet is a summary. It may not cover all possible information. If you have questions about this medicine, talk to your doctor, pharmacist, or health care provider.  2020 Elsevier/Gold Standard (2018-09-26 09:53:29)    Coronavirus (COVID-19) Are you at risk?  Are you at risk for the Coronavirus (COVID-19)?  To be considered HIGH RISK for Coronavirus (COVID-19), you have to meet the following criteria:  . Traveled to [place], [place], [place], [place] or [place]; or in the [place] to [place], [place], [place], or [place]; and have fever, cough, and shortness of breath within the last 2 weeks of travel OR . Been in close contact with a person diagnosed with COVID-19 within the last 2 weeks and have fever, cough, and shortness of breath . IF YOU DO NOT MEET THESE CRITERIA, YOU ARE CONSIDERED LOW RISK FOR COVID-19.  What to do if you are HIGH RISK for COVID-19?  Marland Kitchen If you are having a medical emergency, call 911. . Seek medical care right away. Before you go to a doctor's office, urgent care or emergency department, call ahead and tell them about your recent travel, contact with someone diagnosed with COVID-19, and your symptoms. You should receive instructions from your physician's office regarding next steps of care.  . When you arrive at healthcare provider, tell the healthcare staff immediately you have returned from visiting [place], [place], [place], [place] or  [place]; or traveled in the [place] to [place], [place], [place], or [place]; in the last two weeks or you have been in close contact with a person diagnosed with COVID-19 in the last 2 weeks.   . Tell the health care staff about your symptoms: fever, cough and shortness of breath. . After you have been seen by a medical provider, you will be either: o Tested for (COVID-19) and discharged home on quarantine except to seek medical care if symptoms worsen, and asked to  - Stay home and avoid contact with others until you get your results (4-5 days)  - Avoid travel on public transportation if possible (such as bus, train, or airplane) or o Sent to the Emergency Department by EMS for evaluation, COVID-19 testing, and possible admission depending on your condition and test results.  What to do if you are LOW RISK for COVID-19?  Reduce your risk of any infection by using the same precautions used for avoiding the common cold or flu:  Marland Kitchen Wash your hands often with soap and warm water for at least 20 seconds.  If soap and water are not readily available, use an alcohol-based hand sanitizer with at least 60% alcohol.  . If coughing or sneezing, cover your mouth and nose by coughing or sneezing into the elbow areas of your shirt or coat, into a tissue or into your sleeve (not your hands). . Avoid shaking hands with others and consider head nods or verbal greetings only. . Avoid touching your eyes, nose, or mouth with unwashed hands.  . Avoid close contact with people who are sick. . Avoid places or events with large numbers of people in one location, like concerts or sporting events. . Carefully consider travel plans you have or are making. . If you are planning any travel outside or inside the [place], visit the CDC's Travelers' Health webpage for the latest health notices. . If you have some symptoms but not all symptoms, continue to monitor at home and seek medical attention if your  symptoms worsen. . If you are having a medical emergency, call 911.   ADDITIONAL HEALTHCARE OPTIONS FOR PATIENTS  [facility] / e-Visit: [email or web address]         [facility]: [phone]  [facility]  [facility]: [phone]                   [name]: [phone]   

## 2019-01-07 ENCOUNTER — Inpatient Hospital Stay: Payer: BC Managed Care – PPO | Attending: Oncology

## 2019-01-07 ENCOUNTER — Other Ambulatory Visit: Payer: Self-pay

## 2019-01-07 VITALS — BP 124/83 | HR 86 | Temp 97.9°F | Resp 18

## 2019-01-07 DIAGNOSIS — Z5111 Encounter for antineoplastic chemotherapy: Secondary | ICD-10-CM | POA: Diagnosis not present

## 2019-01-07 DIAGNOSIS — Z5189 Encounter for other specified aftercare: Secondary | ICD-10-CM | POA: Diagnosis not present

## 2019-01-07 DIAGNOSIS — F419 Anxiety disorder, unspecified: Secondary | ICD-10-CM | POA: Diagnosis not present

## 2019-01-07 DIAGNOSIS — R519 Headache, unspecified: Secondary | ICD-10-CM | POA: Insufficient documentation

## 2019-01-07 DIAGNOSIS — Z170 Estrogen receptor positive status [ER+]: Secondary | ICD-10-CM | POA: Insufficient documentation

## 2019-01-07 DIAGNOSIS — K5900 Constipation, unspecified: Secondary | ICD-10-CM | POA: Insufficient documentation

## 2019-01-07 DIAGNOSIS — C50811 Malignant neoplasm of overlapping sites of right female breast: Secondary | ICD-10-CM | POA: Diagnosis present

## 2019-01-07 DIAGNOSIS — C50411 Malignant neoplasm of upper-outer quadrant of right female breast: Secondary | ICD-10-CM

## 2019-01-07 MED ORDER — PEGFILGRASTIM-JMDB 6 MG/0.6ML ~~LOC~~ SOSY
6.0000 mg | PREFILLED_SYRINGE | Freq: Once | SUBCUTANEOUS | Status: AC
  Administered 2019-01-07: 12:00:00 6 mg via SUBCUTANEOUS

## 2019-01-07 NOTE — Patient Instructions (Signed)

## 2019-01-09 ENCOUNTER — Telehealth: Payer: Self-pay | Admitting: *Deleted

## 2019-01-09 NOTE — Telephone Encounter (Signed)
Left message on home phone # to call back regarding how she did with her treatment last week.

## 2019-01-11 NOTE — Progress Notes (Signed)
[place]  [phone] [phone]     ID: Kaylee Romero DOB: 1980-09-28  MR#: [id number]  VFI#:[id number]  Patient Care Team: Kaylee Hedges, DO as PCP - General (Obstetrics and Gynecology) Kaylee Kaufmann, RN as Oncology Nurse Navigator Kaylee Germany, RN as Oncology Nurse Navigator Kaylee Pray, Romero as Consulting Physician (Radiation Oncology) Kaylee Romero, Kaylee Dad, Romero as Consulting Physician (Oncology) Kaylee Romero as Consulting Physician (General Surgery) Kaylee Romero, Kaylee Lofty, DO as Attending Physician (Plastic Surgery) Kaylee Cruel, Romero OTHER Romero:  CHIEF COMPLAINT: estrogen receptor positive breast cancer  CURRENT TREATMENT: Adjuvant chemotherapy   INTERVAL HISTORY: Kaylee Romero  returns today for follow-up of her estrogen receptor positive breast cancer.   She began adjuvant chemotherapy consisting of cyclophosphamide and doxorubicin in dose dense fashion x4 on 01/05/2019. Today is day 8 cycle Romero.  The plan is for a total of 4 cycles of dose dense CA given every 14 days followed by weekly paclitaxel x12  Her most recent echocardiogram on 10/27/2018 showed an ejection fraction of 55-60%.   REVIEW OF SYSTEMS: Kaylee Romero did very well overall although she did have some problems.  She did not do well the evening of the first day of chemo but she thinks this is because she ate a "[place] lunch", meaning a very heavy lunch that day and that may have affected her digestion.  She did very well on days 2 and 3.  By the weekend, days 4 and 5 she had intermittent mild nausea.  She did not take any antiemetic for this and did not vomit.  She would sit down and have something like to eat and that quite at her stomach.  By Monday after her Thursday treatment she was already feeling much better although she did not work on Monday.  She did go back to work virtually of course starting on Tuesday and by today she is feeling "normal".  Interestingly she has not had  any taste alteration to this point.  She did have some constipation which she finally took magnesium citrate for.  She has also developed a headache, which is a bit more persistent than usual health: She usually has a headache a couple of times a week but not daily.  She is minimally fatigued.  Aside from that a detailed review of systems today was stable   HISTORY OF CURRENT ILLNESS: From the original intake note:  Kaylee Romero (pronounced "Bram-Hall") found a palpable lump in the 12 o'clock position of the right breast, which he brought to the attention of her gynecologist.. She underwent bilateral diagnostic mammography with tomography and bilateral breast ultrasonography at [facility] on 09/23/2018 showing: breast density category C; a spiculated mass with associated with architectural distortion and pleomorphic calcifications lying in the 11-12 o'clock position, middle depth, measuring 2 cm in size, corresponding to the palpable abnormality.   Physical exam confirmed a firm but mobile mass in the 12 o'clock position of the right breast.  By ultrasound there was a highly suspicious 2.Romero cm mass in the 12 o'clock position of the right breast; no other evidence of breast malignancy or lymphadenopathy bilaterally.Sonographic evaluation of the right axilla found no enlarged or abnormal appearing lymph node  In the left axilla there were several lymph nodes with areas of cortical prominence similar to the lymph node noted on the right.  Given the symmetry all these are presumed to be reactive  Accordingly on 09/26/2018 she proceeded to biopsy of  the right breast area in question. The pathology from this procedure ([id number]) showed: invasive ductal carcinoma, grade 2; prognostic indicators significant for: estrogen receptor, 80% positive with moderate staining intensity and progesterone receptor, 90% positive with strong staining intensity. Proliferation marker Ki67 at 35%. HER2 negative by  immunohistochemistry (Romero+).  The patient's subsequent history is as detailed below.   PAST MEDICAL HISTORY: Past Medical History:  Diagnosis Date  . Anxiety   . Family history of breast cancer   . HSV (herpes simplex virus) anogenital infection    positive titer only    PAST SURGICAL HISTORY: Past Surgical History:  Procedure Laterality Date  . BREAST LUMPECTOMY WITH AXILLARY LYMPH NODE BIOPSY Right 11/21/2018   Procedure: RIGHT BREAST LUMPECTOMY WITH SENTINEL LYMPH NODE BIOPSY;  Surgeon: Kaylee Romero;  Location: [facility];  Service: General;  Laterality: Right;  . BREAST REDUCTION SURGERY Bilateral 11/21/2018   Procedure: BILATERAL MAMMARY REDUCTION  (BREAST);  Surgeon: Kaylee Going, DO;  Location: [facility];  Service: Plastics;  Laterality: Bilateral;  . PORTACATH PLACEMENT N/A 11/21/2018   Procedure: INSERTION LEFT PORT-A-CATH WITH ULTRASOUND GUIDANCE;  Surgeon: Kaylee Romero;  Location: [facility];  Service: General;  Laterality: N/A;  . TONSILLECTOMY  1987  . TONSILLECTOMY    . WISDOM TOOTH EXTRACTION      FAMILY HISTORY: Family History  Problem Relation Age of Onset  . Hypertension Father   . Alcohol abuse Paternal Grandfather   . Heart disease Paternal Grandfather   . Alcohol abuse Maternal Grandfather   . Heart disease Maternal Grandmother   . Breast cancer Other    Patient's parents are both living at age 51, as of 09/2018. Her father lives in [place], and her mother lives in [place], [place]. The patient denies a family hx of ovarian cancer. A maternal great aunt was diagnosed with breast cancer twice. She has 2 brothers, no sisters   GYNECOLOGIC HISTORY:  No LMP recorded. (Menstrual status: Chemotherapy). Menarche: 39 years old Age at first live birth: 39 years old Kaylee Romero Currently having regular periods  Contraceptive: Mirena IUD in place HRT n/a  Hysterectomy? no BSO? no   SOCIAL HISTORY: (updated 10/05/2018)  Kaylee Romero. Husband  Kaylee Romero is in management with [employer]. Kaylee Romero is 35 months old.     ADVANCED DIRECTIVES: not in place. Her husband is automatically her HCPOA.   HEALTH MAINTENANCE: Social History   Tobacco Use  . Smoking status: Former Smoker    Packs/day: 0.50    Years: 10.00    Pack years: 5.00    Types: Cigarettes    Start date: Romero/09/2002    Quit date: 10/17/2016    Years since quitting: 2.2  . Smokeless tobacco: Never Used  Substance Use Topics  . Alcohol use: Yes    Comment: Socially  . Drug use: No     Colonoscopy: n/a  PAP: 09/20/2018  Bone density: n/a   Allergies  Allergen Reactions  . Chlorhexidine Gluconate Rash    Current Outpatient Medications  Medication Sig Dispense Refill  . acetaminophen (TYLENOL) 500 MG tablet Take Romero tablet (500 mg total) by mouth every 6 (six) hours as needed. 60 tablet 0  . amphetamine-dextroamphetamine (ADDERALL XR) 20 MG 24 hr capsule Take 20 mg by mouth daily.    . Ascorbic Acid (VITAMIN C) 1000 MG tablet Take Romero,000 mg by mouth daily.    . citalopram (CELEXA) 20 MG tablet Take 20 mg by mouth  at bedtime.     . clobetasol cream (TEMOVATE) 6.96 % Apply Romero application topically daily.    Marland Kitchen dexamethasone (DECADRON) 4 MG tablet Take 2 tablets by mouth once a day on the day after chemotherapy and then take 2 tablets two times a day for 2 days. Take with food. 30 tablet Romero  . hydrOXYzine (ATARAX/VISTARIL) 10 MG tablet Take 10 mg by mouth at bedtime.     . lidocaine-prilocaine (EMLA) cream Apply to affected area once 30 g 3  . loratadine (CLARITIN) 10 MG tablet Take Romero tablet (10 mg total) by mouth daily. 60 tablet 3  . LORazepam (ATIVAN) 0.5 MG tablet Take Romero tablet (0.5 mg total) by mouth at bedtime as needed (Nausea or vomiting). 30 tablet 0  . Multiple Vitamin (MULTIVITAMIN WITH MINERALS) TABS tablet Take Romero tablet by mouth daily.    . naproxen sodium (ALEVE) 220 MG tablet Take Romero tablet (220 mg total) by mouth 3 (three) times daily with meals.  60 tablet 3  . prochlorperazine (COMPAZINE) 10 MG tablet Take Romero tablet (10 mg total) by mouth every 6 (six) hours as needed (Nausea or vomiting). 30 tablet Romero  . Tetrahydrozoline HCl (VISINE OP) Place Romero drop into both eyes daily as needed (redness).    . triamcinolone (KENALOG) 0.025 % ointment Apply Romero application topically 2 (two) times daily. 30 g 0  . vitamin B-12 (CYANOCOBALAMIN) 1000 MCG tablet Take Romero,000 mcg by mouth daily.     No current facility-administered medications for this visit.    OBJECTIVE: Young white woman who appears stated age  39:   01/12/19 1350  BP: 132/73  Pulse: (!) 112  Resp: 20  Temp: 98.3 F (36.8 C)  SpO2: 100%   Wt Readings from Last 3 Encounters:  01/12/19 179 lb 12.8 oz (81.6 kg)  01/04/19 179 lb 9.6 oz (81.5 kg)  12/22/18 176 lb 9.6 oz (80.Romero kg)   Body mass index is 30.86 kg/m.    ECOG FS:Romero - Symptomatic but completely ambulatory  Sclerae unicteric, EOMs intact Wearing a mask No cervical or supraclavicular adenopathy Lungs no rales or rhonchi Heart regular rate and rhythm Abd soft, nontender, positive bowel sounds MSK no focal spinal tenderness, no upper extremity lymphedema Neuro: nonfocal, well oriented, appropriate affect Breasts: Deferred  LAB RESULTS:  CMP     Component Value Date/Time   NA 138 01/12/2019 1332   K 3.7 01/12/2019 1332   CL 99 01/12/2019 1332   CO2 28 01/12/2019 1332   GLUCOSE 147 (H) 01/12/2019 1332   BUN 10 01/12/2019 1332   CREATININE 0.70 01/12/2019 1332   CREATININE 0.68 10/05/2018 1244   CALCIUM 8.7 (L) 01/12/2019 1332   PROT 6.7 01/12/2019 1332   ALBUMIN 4.0 01/12/2019 1332   AST 14 (L) 01/12/2019 1332   AST 15 10/05/2018 1244   ALT 27 01/12/2019 1332   ALT 18 10/05/2018 1244   ALKPHOS 87 01/12/2019 1332   BILITOT <0.2 (L) 01/12/2019 1332   BILITOT 0.5 10/05/2018 1244   GFRNONAA >60 01/12/2019 1332   GFRNONAA >60 10/05/2018 1244   GFRAA >60 01/12/2019 1332   GFRAA >60 10/05/2018 1244     No results found for: TOTALPROTELP, ALBUMINELP, A1GS, A2GS, BETS, BETA2SER, GAMS, MSPIKE, SPEI  No results found for: KPAFRELGTCHN, LAMBDASER, KAPLAMBRATIO  Lab Results  Component Value Date   WBC 5.6 01/12/2019   NEUTROABS 3.0 01/12/2019   HGB 12.9 01/12/2019   HCT 38.5 01/12/2019   MCV 93.9 01/12/2019  PLT 185 01/12/2019    No results found for: LABCA2  No components found for: AYTKZS010  No results for input(s): INR in the last 168 hours.  No results found for: LABCA2  No results found for: XNA355  No results found for: DDU202  No results found for: RKY706  No results found for: CA2729  No components found for: HGQUANT  No results found for: CEA1 / No results found for: CEA1   No results found for: AFPTUMOR  No results found for: CHROMOGRNA  No results found for: HGBA, HGBA2QUANT, HGBFQUANT, HGBSQUAN (Hemoglobinopathy evaluation)   No results found for: LDH  No results found for: IRON, TIBC, IRONPCTSAT (Iron and TIBC)  No results found for: FERRITIN  Urinalysis    Component Value Date/Time   BILIRUBINUR Neg 01/18/2012 1458   PROTEINUR Neg 01/18/2012 1458   UROBILINOGEN 0.2 01/18/2012 1458   NITRITE Neg 01/18/2012 1458   LEUKOCYTESUR Negative 01/18/2012 1458     STUDIES: No results found.   ELIGIBLE FOR AVAILABLE RESEARCH PROTOCOL: no  ASSESSMENT: 39 y.o. [place] woman status post right breast upper inner quadrant biopsy on 09/26/2018 for a clinical T2 N0, stage IB invasive ductal carcinoma, grade 2, estrogen and progesterone receptor positive, HER-2 nonamplified, with an MIB-Romero-Romero of 35%  (Romero) Genetic testing reported on 10/05/2018 through the [facility] Breast Cancer STAT Panel + Common Hereditary cancer panel found no pathogenic mutations. The STAT Breast cancer panel offered by [facility] includes sequencing and rearrangement analysis for the following 9 genes:  ATM, BRCA1, BRCA2, CDH1, CHEK2, PALB2, PTEN, STK11 and TP53. The Common Hereditary  Cancers Panel offered by [facility] includes sequencing and/or deletion duplication testing of the following 47 genes: APC, ATM, AXIN2, BARD1, BMPR1A, BRCA1, BRCA2, BRIP1, CDH1, CDKN2A (p14ARF), CDKN2A (p16INK4a), CKD4, CHEK2, CTNNA1, DICER1, EPCAM (Deletion/duplication testing only), GREM1 (promoter region deletion/duplication testing only), KIT, MEN1, MLH1, MSH2, MSH3, MSH6, MUTYH, NBN, NF1, NHTL1, PALB2, PDGFRA, PMS2, POLD1, POLE, PTEN, RAD50, RAD51C, RAD51D, SDHB, SDHC, SDHD, SMAD4, SMARCA4. STK11, TP53, TSC1, TSC2, and VHL.  The following genes were evaluated for sequence changes only: SDHA and HOXB13 c.251G>A variant only.   (2) MammaPrint obtained from the original breast biopsy returned "high risk" indicating the patient will have an excellent prognosis if she receives adjuvant chemotherapy  (3) status post right lumpectomy and right axillary sentinel lymph node sampling 11/21/2018 for a pT2 pN1, stage IIA invasive ductal carcinoma, grade 2, with negative margins  (a) a total of 4 sentinel lymph nodes removed, Romero with macrometastasis  (b) status post bilateral breast reductions at the time of lumpectomy  (4) adjuvant chemotherapy consisting of cyclophosphamide and doxorubicin in dose dense fashion x4 started 01/05/2019, to be followed by weekly paclitaxel x12  (a) echo 10/27/2018 shows an ejection fraction in the 55-60% range  (5) adjuvant radiation to follow, in [place] per patient's request  (6) tamoxifen started 10/05/2018 in anticipation of surgical delays, discontinued 12/05/2018   PLAN: Jossie did generally well with her first cycle of chemotherapy and her nadir counts are excellent.  She will not need intercurrent antibiotics or really into her [place] visits except on the treatment today.  The constipation and headache may be due to partly at least to the palonosetron that she received in the premeds.  We discussed discontinuing that but she is very concerned that she did have some  nausea despite everything and we decided to leave that alone but have her start stool softeners and MiraLAX on the day of chemo and discontinue those  once she has a bowel movement.  She is also Romero to try ginger tea on days 4 and 5 to see if that helps.  Otherwise she has Compazine available.  I do not want to use Zofran because of the constipation issue  We discussed the fact that she is Romero to be losing her hair within the next 10 days.  She already has a wig available to match her current hair.  She will see [place] on treatment day from now on, with no intervening visits unless there are specific symptoms to evaluate  Total time this encounter, 30 minutes  Aleiyah Halpin, Kaylee Dad, Romero  01/12/19 5:45 PM Medical Oncology and Hematology [facility] [place], [place] [address] Tel. [phone]    Fax. [phone]   I, Wilburn Mylar, am acting as scribe for Dr. Virgie Romero. Keyon Liller.  I, Lurline Del Romero, have reviewed the above documentation for accuracy and completeness, and I agree with the above.

## 2019-01-12 ENCOUNTER — Inpatient Hospital Stay: Payer: BC Managed Care – PPO

## 2019-01-12 ENCOUNTER — Inpatient Hospital Stay (HOSPITAL_BASED_OUTPATIENT_CLINIC_OR_DEPARTMENT_OTHER): Payer: BC Managed Care – PPO | Admitting: Oncology

## 2019-01-12 ENCOUNTER — Other Ambulatory Visit: Payer: Self-pay

## 2019-01-12 VITALS — BP 132/73 | HR 112 | Temp 98.3°F | Resp 20 | Ht 64.0 in | Wt 179.8 lb

## 2019-01-12 DIAGNOSIS — Z170 Estrogen receptor positive status [ER+]: Secondary | ICD-10-CM

## 2019-01-12 DIAGNOSIS — C50411 Malignant neoplasm of upper-outer quadrant of right female breast: Secondary | ICD-10-CM

## 2019-01-12 DIAGNOSIS — Z5111 Encounter for antineoplastic chemotherapy: Secondary | ICD-10-CM | POA: Diagnosis not present

## 2019-01-12 LAB — CBC WITH DIFFERENTIAL/PLATELET
Abs Immature Granulocytes: 0.21 10*3/uL — ABNORMAL HIGH (ref 0.00–0.07)
Basophils Absolute: 0.1 10*3/uL (ref 0.0–0.1)
Basophils Relative: 1 %
Eosinophils Absolute: 0.3 10*3/uL (ref 0.0–0.5)
Eosinophils Relative: 5 %
HCT: 38.5 % (ref 36.0–46.0)
Hemoglobin: 12.9 g/dL (ref 12.0–15.0)
Immature Granulocytes: 4 %
Lymphocytes Relative: 29 %
Lymphs Abs: 1.6 10*3/uL (ref 0.7–4.0)
MCH: 31.5 pg (ref 26.0–34.0)
MCHC: 33.5 g/dL (ref 30.0–36.0)
MCV: 93.9 fL (ref 80.0–100.0)
Monocytes Absolute: 0.4 10*3/uL (ref 0.1–1.0)
Monocytes Relative: 7 %
Neutro Abs: 3 10*3/uL (ref 1.7–7.7)
Neutrophils Relative %: 54 %
Platelets: 185 10*3/uL (ref 150–400)
RBC: 4.1 MIL/uL (ref 3.87–5.11)
RDW: 11.9 % (ref 11.5–15.5)
WBC: 5.6 10*3/uL (ref 4.0–10.5)
nRBC: 0 % (ref 0.0–0.2)

## 2019-01-12 LAB — COMPREHENSIVE METABOLIC PANEL
ALT: 27 U/L (ref 0–44)
AST: 14 U/L — ABNORMAL LOW (ref 15–41)
Albumin: 4 g/dL (ref 3.5–5.0)
Alkaline Phosphatase: 87 U/L (ref 38–126)
Anion gap: 11 (ref 5–15)
BUN: 10 mg/dL (ref 6–20)
CO2: 28 mmol/L (ref 22–32)
Calcium: 8.7 mg/dL — ABNORMAL LOW (ref 8.9–10.3)
Chloride: 99 mmol/L (ref 98–111)
Creatinine, Ser: 0.7 mg/dL (ref 0.44–1.00)
GFR calc Af Amer: >60 mL/min (ref >60)
GFR calc non Af Amer: >60 mL/min (ref >60)
Glucose, Bld: 147 mg/dL — ABNORMAL HIGH (ref 70–99)
Potassium: 3.7 mmol/L (ref 3.5–5.1)
Sodium: 138 mmol/L (ref 135–145)
Total Bilirubin: <0.2 mg/dL — ABNORMAL LOW (ref 0.3–1.2)
Total Protein: 6.7 g/dL (ref 6.5–8.1)

## 2019-01-13 ENCOUNTER — Encounter: Payer: Self-pay | Admitting: *Deleted

## 2019-01-18 NOTE — Progress Notes (Signed)
[facility]  [phone]) [phone] Fax:([phone]) [phone]     ID: Kaylee Romero DOB: [birth year]  MR#: [id number]  HYQ#:[id number]  Patient Care Team: Linda Hedges, DO as PCP - General (Obstetrics and Gynecology) Mauro Kaufmann, RN as Oncology Nurse Navigator Rockwell Germany, RN as Oncology Nurse Navigator Gery Pray, MD as Consulting Physician (Radiation Oncology) Laraina Sulton, Virgie Dad, MD as Consulting Physician (Oncology) Jovita Kussmaul, MD as Consulting Physician (General Surgery) Dillingham, Loel Lofty, DO as Attending Physician (Plastic Surgery) Chauncey Cruel, MD OTHER MD:  CHIEF COMPLAINT: estrogen receptor positive breast cancer  CURRENT TREATMENT: Adjuvant chemotherapy   INTERVAL HISTORY: Siyona  returns today for follow-up of her estrogen receptor positive breast cancer.   She began adjuvant chemotherapy consisting of cyclophosphamide and doxorubicin in dose dense fashion x4 on 01/05/2019. Today is day 1 cycle 2.  The plan is for a total of 4 cycles of dose dense CA given every 14 days followed by weekly paclitaxel x12  She tells me she has felt normal this past week.  She worked every day, normally.  The one thing she has not been doing is exercising regularly.  She usually does this by taking her 31-monthold daughter around the neighborhood.  Her most recent echocardiogram on 10/27/2018 showed an ejection fraction of 55-60%.   REVIEW OF SYSTEMS: KSalindais beginning to lose her hair but this is not yet noticeable.  I was supposed to have called her and 800 mg Tylenol tablet which I did not do.  She had some headaches.  She also had some constipation.  This is discussed further below.  She has not had a period for the past 2 months.  Aside from this a detailed review of systems today was stable   HISTORY OF CURRENT ILLNESS: From the original intake note:  Bulah B Seckman (pronounced "Bram-Hall") found a palpable lump in the 12 o'clock position of the  right breast, which he brought to the attention of her gynecologist.. She underwent bilateral diagnostic mammography with tomography [facility] showing: breast density category C; a spiculated mass with associated with architectural distortion and pleomorphic calcifications lying in the 11-12 o'clock position, middle depth, measuring 2 cm in size, corresponding to the palpable abnormality.   Physical exam confirmed a firm but mobile mass in the 12 o'clock position of the right breast.  By ultrasound there was a highly suspicious 2.1 cm mass in the 12 o'clock position of the right breast; no other evidence of breast malignancy or lymphadenopathy bilaterally.Sonographic evaluation of the right axilla found no enlarged or abnormal appearing lymph node  In the left axilla there were several lymph nodes with areas of cortical prominence similar to the lymph node noted on the right.  Given the symmetry all these are presumed to be reactive  Accordingly on 09/26/2018 she proceeded to biopsy of the right breast area in question. The pathology from this procedure ([id number]) showed: invasive ductal carcinoma, grade 2; prognostic indicators significant for: estrogen receptor, 80% positive with moderate staining intensity and progesterone receptor, 90% positive with strong staining intensity. Proliferation marker Ki67 at 35%. HER2 negative by immunohistochemistry (1+).  The patient's subsequent history is as detailed below.   PAST MEDICAL HISTORY: Past Medical History:  Diagnosis Date  . Anxiety   . Family history of breast cancer   . HSV (herpes simplex virus) anogenital infection    positive titer only    PAST SURGICAL HISTORY:  Past Surgical History:  Procedure Laterality Date  . BREAST LUMPECTOMY WITH AXILLARY LYMPH NODE BIOPSY Right 11/21/2018   Procedure: RIGHT BREAST LUMPECTOMY WITH SENTINEL LYMPH NODE BIOPSY;  Surgeon: Jovita Kussmaul,  MD;  Location: [facility];  Service: General;  Laterality: Right;  . BREAST REDUCTION SURGERY Bilateral 11/21/2018   Procedure: BILATERAL MAMMARY REDUCTION  (BREAST);  Surgeon: Wallace Going, DO;  [facility];  Service: Plastics;  Laterality: Bilateral;  . PORTACATH PLACEMENT N/A 11/21/2018   Procedure: INSERTION LEFT PORT-A-CATH WITH ULTRASOUND GUIDANCE;  Surgeon: Jovita Kussmaul, MD;  Location: [facility];  Service: General;  Laterality: N/A;  . TONSILLECTOMY  1987  . TONSILLECTOMY    . WISDOM TOOTH EXTRACTION      FAMILY HISTORY: Family History  Problem Relation Age of Onset  . Hypertension Father   . Alcohol abuse Paternal Grandfather   . Heart disease Paternal Grandfather   . Alcohol abuse Maternal Grandfather   . Heart disease Maternal Grandmother   . Breast cancer Other    Patient's parents are both living at age 13, as of 09/2018. Her father lives in [place], and her mother lives in [place], [place]. The patient denies a family hx of ovarian cancer. A maternal great aunt was diagnosed with breast cancer twice. She has 2 brothers, no sisters   GYNECOLOGIC HISTORY:  No LMP recorded. (Menstrual status: Chemotherapy). Menarche: 39 years old Age at first live birth: 39 years old [place] P 1 Currently having regular periods  Contraceptive: Mirena IUD in place HRT n/a  Hysterectomy? no BSO? no   SOCIAL HISTORY: (updated January 2021) [place] is a [employer]. Husband Tommi Rumps is in management with [employer]. Daughter Normand Sloop is 53 months old.     ADVANCED DIRECTIVES: not in place. Her husband is automatically her HCPOA.   HEALTH MAINTENANCE: Social History   Tobacco Use  . Smoking status: Former Smoker    Packs/day: 0.50    Years: 10.00    Pack years: 5.00    Types: Cigarettes    Start date: 01/13/2002    Quit date: 10/17/2016    Years since quitting: 2.2  . Smokeless tobacco: Never Used  Substance Use Topics  . Alcohol use: Yes    Comment: Socially  . Drug use: No      Colonoscopy: n/a  PAP: 09/20/2018  Bone density: n/a   Allergies  Allergen Reactions  . Chlorhexidine Gluconate Rash    Current Outpatient Medications  Medication Sig Dispense Refill  . acetaminophen (TYLENOL) 500 MG tablet Take 1 tablet (500 mg total) by mouth every 6 (six) hours as needed. 60 tablet 0  . amphetamine-dextroamphetamine (ADDERALL XR) 20 MG 24 hr capsule Take 20 mg by mouth daily.    . Ascorbic Acid (VITAMIN C) 1000 MG tablet Take 1,000 mg by mouth daily.    . citalopram (CELEXA) 20 MG tablet Take 20 mg by mouth at bedtime.     . clobetasol cream (TEMOVATE) 6.19 % Apply 1 application topically daily.    Marland Kitchen dexamethasone (DECADRON) 4 MG tablet Take 2 tablets by mouth once a day on the day after chemotherapy and then take 2 tablets two times a day for 2 days. Take with food. 30 tablet 1  . hydrOXYzine (ATARAX/VISTARIL) 10 MG tablet Take 10 mg by mouth at bedtime.     Marland Kitchen ibuprofen (ADVIL) 800 MG tablet Take 1 tablet (800 mg total) by mouth every 8 (eight) hours as needed. 30 tablet 0  . lidocaine-prilocaine (  EMLA) cream Apply to affected area once 30 g 3  . loratadine (CLARITIN) 10 MG tablet Take 1 tablet (10 mg total) by mouth daily. 60 tablet 3  . LORazepam (ATIVAN) 0.5 MG tablet Take 1 tablet (0.5 mg total) by mouth at bedtime as needed (Nausea or vomiting). 30 tablet 0  . Multiple Vitamin (MULTIVITAMIN WITH MINERALS) TABS tablet Take 1 tablet by mouth daily.    . naproxen sodium (ALEVE) 220 MG tablet Take 1 tablet (220 mg total) by mouth 3 (three) times daily with meals. 60 tablet 3  . prochlorperazine (COMPAZINE) 10 MG tablet Take 1 tablet (10 mg total) by mouth every 6 (six) hours as needed (Nausea or vomiting). 30 tablet 1  . Tetrahydrozoline HCl (VISINE OP) Place 1 drop into both eyes daily as needed (redness).    . triamcinolone (KENALOG) 0.025 % ointment Apply 1 application topically 2 (two) times daily. 30 g 0  . vitamin B-12 (CYANOCOBALAMIN) 1000 MCG tablet Take  1,000 mcg by mouth daily.     No current facility-administered medications for this visit.    OBJECTIVE: Young white woman in no acute distress  Vitals:   01/19/19 0824  BP: 115/70  Pulse: (!) 101  Resp: 18  Temp: 98.2 F (36.8 C)  SpO2: 99%   Wt Readings from Last 3 Encounters:  01/19/19 182 lb 1.6 oz (82.6 kg)  01/12/19 179 lb 12.8 oz (81.6 kg)  01/04/19 179 lb 9.6 oz (81.5 kg)   Body mass index is 31.26 kg/m.    ECOG FS:1 - Symptomatic but completely ambulatory  Sclerae unicteric, EOMs intact Wearing a mask No cervical or supraclavicular adenopathy Lungs no rales or rhonchi Heart regular rate and rhythm Abd soft, nontender, positive bowel sounds MSK no focal spinal tenderness, no upper extremity lymphedema Neuro: nonfocal, well oriented, appropriate affect Breasts: Deferred   LAB RESULTS:  CMP     Component Value Date/Time   NA 138 01/12/2019 1332   K 3.7 01/12/2019 1332   CL 99 01/12/2019 1332   CO2 28 01/12/2019 1332   GLUCOSE 147 (H) 01/12/2019 1332   BUN 10 01/12/2019 1332   CREATININE 0.70 01/12/2019 1332   CREATININE 0.68 10/05/2018 1244   CALCIUM 8.7 (L) 01/12/2019 1332   PROT 6.7 01/12/2019 1332   ALBUMIN 4.0 01/12/2019 1332   AST 14 (L) 01/12/2019 1332   AST 15 10/05/2018 1244   ALT 27 01/12/2019 1332   ALT 18 10/05/2018 1244   ALKPHOS 87 01/12/2019 1332   BILITOT <0.2 (L) 01/12/2019 1332   BILITOT 0.5 10/05/2018 1244   GFRNONAA >60 01/12/2019 1332   GFRNONAA >60 10/05/2018 1244   GFRAA >60 01/12/2019 1332   GFRAA >60 10/05/2018 1244    No results found for: TOTALPROTELP, ALBUMINELP, A1GS, A2GS, BETS, BETA2SER, GAMS, MSPIKE, SPEI  No results found for: KPAFRELGTCHN, LAMBDASER, KAPLAMBRATIO  Lab Results  Component Value Date   WBC 10.9 (H) 01/19/2019   NEUTROABS 7.7 01/19/2019   HGB 12.1 01/19/2019   HCT 36.3 01/19/2019   MCV 91.4 01/19/2019   PLT 203 01/19/2019    No results found for: LABCA2  No components found for:  ZCHYIF027  No results for input(s): INR in the last 168 hours.  No results found for: LABCA2  No results found for: XAJ287  No results found for: OMV672  No results found for: CNO709  No results found for: CA2729  No components found for: HGQUANT  No results found for: CEA1 / No results found  for: CEA1   No results found for: AFPTUMOR  No results found for: CHROMOGRNA  No results found for: HGBA, HGBA2QUANT, HGBFQUANT, HGBSQUAN (Hemoglobinopathy evaluation)   No results found for: LDH  No results found for: IRON, TIBC, IRONPCTSAT (Iron and TIBC)  No results found for: FERRITIN  Urinalysis    Component Value Date/Time   BILIRUBINUR Neg 01/18/2012 1458   PROTEINUR Neg 01/18/2012 1458   UROBILINOGEN 0.2 01/18/2012 1458   NITRITE Neg 01/18/2012 1458   LEUKOCYTESUR Negative 01/18/2012 1458     STUDIES: No results found.   ELIGIBLE FOR AVAILABLE RESEARCH PROTOCOL: no  ASSESSMENT: 39 y.o. [place] woman status post right breast upper inner quadrant biopsy on 09/26/2018 for a clinical T2 N0, stage IB invasive ductal carcinoma, grade 2, estrogen and progesterone receptor positive, HER-2 nonamplified, with an MIB-1-1 of 35%  (1) Genetic testing reported on 10/05/2018 through the [facility] Breast Cancer STAT Panel + Common Hereditary cancer panel found no pathogenic mutations. The STAT Breast cancer panel offered by [facility] includes sequencing and rearrangement analysis for the following 9 genes:  ATM, BRCA1, BRCA2, CDH1, CHEK2, PALB2, PTEN, STK11 and TP53. The Common Hereditary Cancers Panel offered by [facility] includes sequencing and/or deletion duplication testing of the following 47 genes: APC, ATM, AXIN2, BARD1, BMPR1A, BRCA1, BRCA2, BRIP1, CDH1, CDKN2A (p14ARF), CDKN2A (p16INK4a), CKD4, CHEK2, CTNNA1, DICER1, EPCAM (Deletion/duplication testing only), GREM1 (promoter region deletion/duplication testing only), KIT, MEN1, MLH1, MSH2, MSH3, MSH6, MUTYH, NBN, NF1, NHTL1,  PALB2, PDGFRA, PMS2, POLD1, POLE, PTEN, RAD50, RAD51C, RAD51D, SDHB, SDHC, SDHD, SMAD4, SMARCA4. STK11, TP53, TSC1, TSC2, and VHL.  The following genes were evaluated for sequence changes only: SDHA and HOXB13 c.251G>A variant only.   (2) MammaPrint obtained from the original breast biopsy returned "high risk" indicating the patient will have an excellent prognosis if she receives adjuvant chemotherapy  (3) status post right lumpectomy and right axillary sentinel lymph node sampling 11/21/2018 for a pT2 pN1, stage IIA invasive ductal carcinoma, grade 2, with negative margins  (a) a total of 4 sentinel lymph nodes removed, 1 with macrometastasis  (b) status post bilateral breast reductions at the time of lumpectomy  (4) adjuvant chemotherapy consisting of cyclophosphamide and doxorubicin in dose dense fashion x4 started 01/05/2019, to be followed by weekly paclitaxel x12  (a) echo 10/27/2018 shows an ejection fraction in the 55-60% range  (5) adjuvant radiation to follow, in [place] per patient's request  (6) tamoxifen started 10/05/2018 in anticipation of surgical delays, discontinued 12/05/2018   PLAN: Lela recovered very nicely from her first cycle of chemotherapy and is ready for cycle #2 today.  I have eliminated the palonosetron and substituted metoclopramide.  She will follow the anti-nausea instructions as before.  I am hopeful this will cause less headache and less constipation but she knows how to take care of those 2 problems if they occur.  I suggested for headaches she take naproxen 220 mg plus Tylenol 500 mg and then if that does not work she can use the ibuprofen 800 mg.  She has not had a period in 2 months.  She understands this may or may not be a permanent issue and she could have return of ovarian function at any point  She is already losing her hair and likely will have lost it by her next visit which will be in 2 weeks  She knows to call for any other issue that may  develop before the next visit  Total encounter time 30 minutes.*  Skya Mccullum, Sarajane Jews  C, MD  01/19/19 8:43 AM Medical Oncology and Hematology [facility] [place],  [address] Tel. [phone]    Fax. [phone]   I, Wilburn Mylar, am acting as scribe for Dr. Virgie Dad. Kedrick Mcnamee.  I, Lurline Del MD, have reviewed the above documentation for accuracy and completeness, and I agree with the above.   *Total Encounter Time as defined by the Centers for Medicare and Medicaid Services includes, in addition to the face-to-face time of a patient visit (documented in the note above) non-face-to-face time: obtaining and reviewing outside history, ordering and reviewing medications, tests or procedures, care coordination (communications with other health care professionals or caregivers) and documentation in the medical record.

## 2019-01-19 ENCOUNTER — Other Ambulatory Visit: Payer: Self-pay

## 2019-01-19 ENCOUNTER — Inpatient Hospital Stay: Payer: BC Managed Care – PPO

## 2019-01-19 ENCOUNTER — Other Ambulatory Visit: Payer: Self-pay | Admitting: *Deleted

## 2019-01-19 ENCOUNTER — Inpatient Hospital Stay (HOSPITAL_BASED_OUTPATIENT_CLINIC_OR_DEPARTMENT_OTHER): Payer: BC Managed Care – PPO | Admitting: Oncology

## 2019-01-19 VITALS — HR 89

## 2019-01-19 VITALS — BP 115/70 | HR 101 | Temp 98.2°F | Resp 18 | Ht 64.0 in | Wt 182.1 lb

## 2019-01-19 DIAGNOSIS — Z170 Estrogen receptor positive status [ER+]: Secondary | ICD-10-CM | POA: Diagnosis not present

## 2019-01-19 DIAGNOSIS — C50411 Malignant neoplasm of upper-outer quadrant of right female breast: Secondary | ICD-10-CM

## 2019-01-19 DIAGNOSIS — Z5111 Encounter for antineoplastic chemotherapy: Secondary | ICD-10-CM | POA: Diagnosis not present

## 2019-01-19 DIAGNOSIS — Z95828 Presence of other vascular implants and grafts: Secondary | ICD-10-CM

## 2019-01-19 LAB — CBC WITH DIFFERENTIAL/PLATELET
Abs Immature Granulocytes: 0.52 10*3/uL — ABNORMAL HIGH (ref 0.00–0.07)
Basophils Absolute: 0.1 10*3/uL (ref 0.0–0.1)
Basophils Relative: 1 %
Eosinophils Absolute: 0.1 10*3/uL (ref 0.0–0.5)
Eosinophils Relative: 1 %
HCT: 36.3 % (ref 36.0–46.0)
Hemoglobin: 12.1 g/dL (ref 12.0–15.0)
Immature Granulocytes: 5 %
Lymphocytes Relative: 18 %
Lymphs Abs: 2 10*3/uL (ref 0.7–4.0)
MCH: 30.5 pg (ref 26.0–34.0)
MCHC: 33.3 g/dL (ref 30.0–36.0)
MCV: 91.4 fL (ref 80.0–100.0)
Monocytes Absolute: 0.5 10*3/uL (ref 0.1–1.0)
Monocytes Relative: 4 %
Neutro Abs: 7.7 10*3/uL (ref 1.7–7.7)
Neutrophils Relative %: 71 %
Platelets: 203 10*3/uL (ref 150–400)
RBC: 3.97 MIL/uL (ref 3.87–5.11)
RDW: 12.2 % (ref 11.5–15.5)
WBC: 10.9 10*3/uL — ABNORMAL HIGH (ref 4.0–10.5)
nRBC: 0 % (ref 0.0–0.2)

## 2019-01-19 LAB — COMPREHENSIVE METABOLIC PANEL
ALT: 32 U/L (ref 0–44)
AST: 16 U/L (ref 15–41)
Albumin: 3.7 g/dL (ref 3.5–5.0)
Alkaline Phosphatase: 74 U/L (ref 38–126)
Anion gap: 9 (ref 5–15)
BUN: 15 mg/dL (ref 6–20)
CO2: 23 mmol/L (ref 22–32)
Calcium: 8.1 mg/dL — ABNORMAL LOW (ref 8.9–10.3)
Chloride: 107 mmol/L (ref 98–111)
Creatinine, Ser: 0.66 mg/dL (ref 0.44–1.00)
GFR calc Af Amer: >60 mL/min (ref >60)
GFR calc non Af Amer: >60 mL/min (ref >60)
Glucose, Bld: 111 mg/dL — ABNORMAL HIGH (ref 70–99)
Potassium: 4 mmol/L (ref 3.5–5.1)
Sodium: 139 mmol/L (ref 135–145)
Total Bilirubin: <0.2 mg/dL — ABNORMAL LOW (ref 0.3–1.2)
Total Protein: 6.4 g/dL — ABNORMAL LOW (ref 6.5–8.1)

## 2019-01-19 MED ORDER — IBUPROFEN 800 MG PO TABS: 800.0000 mg | ORAL_TABLET | Freq: Three times a day (TID) | ORAL | 0 refills | Status: DC | PRN

## 2019-01-19 MED ORDER — DOXORUBICIN HCL CHEMO IV INJECTION 2 MG/ML
60.0000 mg/m2 | Freq: Once | INTRAVENOUS | Status: AC
  Administered 2019-01-19: 114 mg via INTRAVENOUS
  Filled 2019-01-19: qty 57

## 2019-01-19 MED ORDER — SODIUM CHLORIDE 0.9 % IV SOLN
Freq: Once | INTRAVENOUS | Status: AC
  Filled 2019-01-19: qty 5

## 2019-01-19 MED ORDER — SODIUM CHLORIDE 0.9 % IV SOLN
600.0000 mg/m2 | Freq: Once | INTRAVENOUS | Status: AC
  Administered 2019-01-19: 1140 mg via INTRAVENOUS
  Filled 2019-01-19: qty 57

## 2019-01-19 MED ORDER — METOCLOPRAMIDE HCL 5 MG/ML IJ SOLN
INTRAMUSCULAR | Status: AC
  Filled 2019-01-19: qty 2

## 2019-01-19 MED ORDER — METOCLOPRAMIDE HCL 10 MG PO TABS: 10.0000 mg | ORAL_TABLET | Freq: Three times a day (TID) | ORAL | 1 refills | Status: DC | PRN

## 2019-01-19 MED ORDER — PEGFILGRASTIM 6 MG/0.6ML ~~LOC~~ PSKT: 6.0000 mg | PREFILLED_SYRINGE | Freq: Once | SUBCUTANEOUS | Status: DC

## 2019-01-19 MED ORDER — HEPARIN SOD (PORK) LOCK FLUSH 100 UNIT/ML IV SOLN
500.0000 [IU] | Freq: Once | INTRAVENOUS | Status: DC
  Filled 2019-01-19: qty 5

## 2019-01-19 MED ORDER — METOCLOPRAMIDE HCL 5 MG/ML IJ SOLN
10.0000 mg | Freq: Once | INTRAMUSCULAR | Status: AC
  Administered 2019-01-19: 10 mg via INTRAVENOUS

## 2019-01-19 MED ORDER — HEPARIN SOD (PORK) LOCK FLUSH 100 UNIT/ML IV SOLN
500.0000 [IU] | Freq: Once | INTRAVENOUS | Status: AC | PRN
  Administered 2019-01-19: 500 [IU]
  Filled 2019-01-19: qty 5

## 2019-01-19 MED ORDER — SODIUM CHLORIDE 0.9% FLUSH
10.0000 mL | INTRAVENOUS | Status: DC | PRN
  Administered 2019-01-19: 10 mL via INTRAVENOUS
  Filled 2019-01-19: qty 10

## 2019-01-19 MED ORDER — SODIUM CHLORIDE 0.9 % IV SOLN
Freq: Once | INTRAVENOUS | Status: AC
  Filled 2019-01-19: qty 250

## 2019-01-19 MED ORDER — SODIUM CHLORIDE 0.9% FLUSH
10.0000 mL | INTRAVENOUS | Status: DC | PRN
  Administered 2019-01-19: 10 mL
  Filled 2019-01-19: qty 10

## 2019-01-19 NOTE — Patient Instructions (Signed)
[facility] Discharge Instructions for Patients Receiving Chemotherapy  Today you received the following chemotherapy agents: Doxorubicin (Adriamycin) and Cyclophosphamide (Cytoxan)  To help prevent nausea and vomiting after your treatment, we encourage you to take your nausea medication as directed.   If you develop nausea and vomiting that is not controlled by your nausea medication, call the clinic.   BELOW ARE SYMPTOMS THAT SHOULD BE REPORTED IMMEDIATELY:  *FEVER GREATER THAN 100.5 F  *CHILLS WITH OR WITHOUT FEVER  NAUSEA AND VOMITING THAT IS NOT CONTROLLED WITH YOUR NAUSEA MEDICATION  *UNUSUAL SHORTNESS OF BREATH  *UNUSUAL BRUISING OR BLEEDING  TENDERNESS IN MOUTH AND THROAT WITH OR WITHOUT PRESENCE OF ULCERS  *URINARY PROBLEMS  *BOWEL PROBLEMS  UNUSUAL RASH Items with * indicate a potential emergency and should be followed up as soon as possible.  Feel free to call the clinic should you have any questions or concerns. The clinic phone number is [phone].  Please show the [facility] at check-in to the Emergency Department and triage nurse.

## 2019-01-21 ENCOUNTER — Inpatient Hospital Stay: Payer: BC Managed Care – PPO

## 2019-01-21 VITALS — BP 130/78 | HR 84 | Temp 98.5°F | Resp 18

## 2019-01-21 DIAGNOSIS — C50411 Malignant neoplasm of upper-outer quadrant of right female breast: Secondary | ICD-10-CM

## 2019-01-21 DIAGNOSIS — Z5111 Encounter for antineoplastic chemotherapy: Secondary | ICD-10-CM | POA: Diagnosis not present

## 2019-01-21 MED ORDER — PEGFILGRASTIM-JMDB 6 MG/0.6ML ~~LOC~~ SOSY
6.0000 mg | PREFILLED_SYRINGE | Freq: Once | SUBCUTANEOUS | Status: AC
  Administered 2019-01-21: 6 mg via SUBCUTANEOUS

## 2019-01-21 NOTE — Patient Instructions (Signed)

## 2019-02-01 NOTE — Progress Notes (Signed)
[facility]  [phone] [phone]) [phone]     ID: Kaylee Romero DOB: Feb 11, 1980  MR#: [id number]  PXT#:[id number]  Patient Care Team: Linda Hedges, DO as PCP - General (Obstetrics and Gynecology) Mauro Kaufmann, RN as Oncology Nurse Navigator Rockwell Germany, RN as Oncology Nurse Navigator Gery Pray, MD as Consulting Physician (Radiation Oncology) Maxx Calaway, Virgie Dad, MD as Consulting Physician (Oncology) Jovita Kussmaul, MD as Consulting Physician (General Surgery) Dillingham, Loel Lofty, DO as Attending Physician (Plastic Surgery) Chauncey Cruel, MD OTHER MD:  CHIEF COMPLAINT: estrogen receptor positive breast cancer  CURRENT TREATMENT: Adjuvant chemotherapy   INTERVAL HISTORY: Kaylee Romero  returns today for follow-up of her estrogen receptor positive breast cancer.   She continues on adjuvant chemotherapy consisting of cyclophosphamide and doxorubicin. Today is day 1 cycle 3.    Her most recent echocardiogram on 10/27/2018 showed an ejection fraction of 55-60%.   REVIEW OF SYSTEMS: Kaylee Romero lost her hair in clumps then had her mother both her hair off and then she shaved it.  She got a hat/wig which is very becoming.  She is continuing to work full-time, from home.  She feels tired on days 3 and 4 and then a little bit on day 5 but by then she is back to work.  On the weekend her husband and is very helpful and also they have a "meal train" were folks bring them food and that is really helpful during that difficult weekend.  On the week of recovery she walks 30 to 31mnutes.  She is concerned that she has gained some weight.  She managed constipation well with MiraLAX.  She has not signed up for the vaccine yet.  A detailed review of systems today was otherwise stable   HISTORY OF CURRENT ILLNESS: From the original intake note:  Kaylee B Mcinturff (pronounced "Bram-Hall") found a palpable lump in the 12 o'clock position of the right breast, which he  brought to the attention of her gynecologist.. She underwent bilateral diagnostic mammography with tomography and [facility] showing: breast density category C; a spiculated mass with associated with architectural distortion and pleomorphic calcifications lying in the 11-12 o'clock position, middle depth, measuring 2 cm in size, corresponding to the palpable abnormality.   Physical exam confirmed a firm but mobile mass in the 12 o'clock position of the right breast.  By ultrasound there was a highly suspicious 2.1 cm mass in the 12 o'clock position of the right breast; no other evidence of breast malignancy or lymphadenopathy bilaterally.Sonographic evaluation of the right axilla found no enlarged or abnormal appearing lymph node  In the left axilla there were several lymph nodes with areas of cortical prominence similar to the lymph node noted on the right.  Given the symmetry all these are presumed to be reactive  Accordingly on 09/26/2018 she proceeded to biopsy of the right breast area in question. The pathology from this procedure ([id number]) showed: invasive ductal carcinoma, grade 2; prognostic indicators significant for: estrogen receptor, 80% positive with moderate staining intensity and progesterone receptor, 90% positive with strong staining intensity. Proliferation marker Ki67 at 35%. HER2 negative by immunohistochemistry (1+).  The patient's subsequent history is as detailed below.   PAST MEDICAL HISTORY: Past Medical History:  Diagnosis Date  . Anxiety   . Family history of breast cancer   . HSV (herpes simplex virus) anogenital infection    positive titer only    PAST SURGICAL HISTORY:  Past Surgical History:  Procedure Laterality Date  . BREAST LUMPECTOMY WITH AXILLARY LYMPH NODE BIOPSY Right 11/21/2018   Procedure: RIGHT BREAST LUMPECTOMY WITH SENTINEL LYMPH NODE BIOPSY;  Surgeon: Jovita Kussmaul, MD;  Location: [facility];   Service: General;  Laterality: Right;  . BREAST REDUCTION SURGERY Bilateral 11/21/2018   Procedure: BILATERAL MAMMARY REDUCTION  (BREAST);  Surgeon: Wallace Going, DO;  Location: [facility];  Service: Plastics;  Laterality: Bilateral;  . PORTACATH PLACEMENT N/A 11/21/2018   Procedure: INSERTION LEFT PORT-A-CATH WITH ULTRASOUND GUIDANCE;  Surgeon: Jovita Kussmaul, MD;  Location: [facility];  Service: General;  Laterality: N/A;  . TONSILLECTOMY  1987  . TONSILLECTOMY    . WISDOM TOOTH EXTRACTION      FAMILY HISTORY: Family History  Problem Relation Age of Onset  . Hypertension Father   . Alcohol abuse Paternal Grandfather   . Heart disease Paternal Grandfather   . Alcohol abuse Maternal Grandfather   . Heart disease Maternal Grandmother   . Breast cancer Other    Patient's parents are both living at age 54, as of 09/2018. Her father lives in [place], and her mother lives in [place], [place]. The patient denies a family hx of ovarian cancer. A maternal great aunt was diagnosed with breast cancer twice. She has 2 brothers, no sisters   GYNECOLOGIC HISTORY:  No LMP recorded. (Menstrual status: Chemotherapy). Menarche: 39 years old Age at first live birth: 39 years old [place] P 1 Currently having regular periods  Contraceptive: Mirena IUD in place HRT n/a  Hysterectomy? no BSO? no   SOCIAL HISTORY: (updated January 2021) Kaylee Romero is a [employer]. Husband Tommi Rumps is in management with [employer]. [name] is 54 months old.     ADVANCED DIRECTIVES: not in place. Her husband is automatically her HCPOA.   HEALTH MAINTENANCE: Social History   Tobacco Use  . Smoking status: Former Smoker    Packs/day: 0.50    Years: 10.00    Pack years: 5.00    Types: Cigarettes    Start date: 01/13/2002    Quit date: 10/17/2016    Years since quitting: 2.2  . Smokeless tobacco: Never Used  Substance Use Topics  . Alcohol use: Yes    Comment: Socially  . Drug use: No     Colonoscopy:  n/a  PAP: 09/20/2018  Bone density: n/a   Allergies  Allergen Reactions  . Chlorhexidine Gluconate Rash    Current Outpatient Medications  Medication Sig Dispense Refill  . acetaminophen (TYLENOL) 500 MG tablet Take 1 tablet (500 mg total) by mouth every 6 (six) hours as needed. 60 tablet 0  . amphetamine-dextroamphetamine (ADDERALL XR) 20 MG 24 hr capsule Take 20 mg by mouth daily.    . Ascorbic Acid (VITAMIN C) 1000 MG tablet Take 1,000 mg by mouth daily.    . citalopram (CELEXA) 20 MG tablet Take 20 mg by mouth at bedtime.     . clobetasol cream (TEMOVATE) 6.31 % Apply 1 application topically daily.    Marland Kitchen dexamethasone (DECADRON) 4 MG tablet Take 2 tablets by mouth once a day on the day after chemotherapy and then take 2 tablets two times a day for 2 days. Take with food. 30 tablet 1  . hydrOXYzine (ATARAX/VISTARIL) 10 MG tablet Take 10 mg by mouth at bedtime.     Marland Kitchen ibuprofen (ADVIL) 800 MG tablet Take 1 tablet (800 mg total) by mouth every 8 (eight) hours as needed. 30 tablet 0  . lidocaine-prilocaine (  EMLA) cream Apply to affected area once 30 g 3  . loratadine (CLARITIN) 10 MG tablet Take 1 tablet (10 mg total) by mouth daily. 60 tablet 3  . LORazepam (ATIVAN) 0.5 MG tablet Take 1 tablet (0.5 mg total) by mouth at bedtime as needed (Nausea or vomiting). 30 tablet 0  . Multiple Vitamin (MULTIVITAMIN WITH MINERALS) TABS tablet Take 1 tablet by mouth daily.    . naproxen sodium (ALEVE) 220 MG tablet Take 1 tablet (220 mg total) by mouth 3 (three) times daily with meals. 60 tablet 3  . prochlorperazine (COMPAZINE) 10 MG tablet Take 1 tablet (10 mg total) by mouth every 6 (six) hours as needed (Nausea or vomiting). 30 tablet 1  . Tetrahydrozoline HCl (VISINE OP) Place 1 drop into both eyes daily as needed (redness).    . triamcinolone (KENALOG) 0.025 % ointment Apply 1 application topically 2 (two) times daily. 30 g 0  . vitamin B-12 (CYANOCOBALAMIN) 1000 MCG tablet Take 1,000 mcg by mouth  daily.     No current facility-administered medications for this visit.    OBJECTIVE: Young white woman who appears stated age  3:   02/02/19 0842  BP: 121/80  Pulse: 93  Resp: 17  Temp: 97.8 F (36.6 C)  SpO2: 100%   Wt Readings from Last 3 Encounters:  02/02/19 183 lb 1.6 oz (83.1 kg)  01/19/19 182 lb 1.6 oz (82.6 kg)  01/12/19 179 lb 12.8 oz (81.6 kg)   Body mass index is 31.43 kg/m.    ECOG FS:1 - Symptomatic but completely ambulatory  Sclerae unicteric, EOMs intact Wearing a mask No cervical or supraclavicular adenopathy Lungs no rales or rhonchi Heart regular rate and rhythm Abd soft, nontender, positive bowel sounds MSK no focal spinal tenderness, no upper extremity lymphedema Neuro: nonfocal, well oriented, appropriate affect Breasts: Deferred   LAB RESULTS:  CMP     Component Value Date/Time   NA 139 01/19/2019 0809   K 4.0 01/19/2019 0809   CL 107 01/19/2019 0809   CO2 23 01/19/2019 0809   GLUCOSE 111 (H) 01/19/2019 0809   BUN 15 01/19/2019 0809   CREATININE 0.66 01/19/2019 0809   CREATININE 0.68 10/05/2018 1244   CALCIUM 8.1 (L) 01/19/2019 0809   PROT 6.4 (L) 01/19/2019 0809   ALBUMIN 3.7 01/19/2019 0809   AST 16 01/19/2019 0809   AST 15 10/05/2018 1244   ALT 32 01/19/2019 0809   ALT 18 10/05/2018 1244   ALKPHOS 74 01/19/2019 0809   BILITOT <0.2 (L) 01/19/2019 0809   BILITOT 0.5 10/05/2018 1244   GFRNONAA >60 01/19/2019 0809   GFRNONAA >60 10/05/2018 1244   GFRAA >60 01/19/2019 0809   GFRAA >60 10/05/2018 1244    No results found for: TOTALPROTELP, ALBUMINELP, A1GS, A2GS, BETS, BETA2SER, GAMS, MSPIKE, SPEI  No results found for: KPAFRELGTCHN, LAMBDASER, KAPLAMBRATIO  Lab Results  Component Value Date   WBC 9.7 02/02/2019   NEUTROABS 6.8 02/02/2019   HGB 11.4 (L) 02/02/2019   HCT 33.8 (L) 02/02/2019   MCV 93.9 02/02/2019   PLT 219 02/02/2019    No results found for: LABCA2  No components found for: OACZYS063  No  results for input(s): INR in the last 168 hours.  No results found for: LABCA2  No results found for: KZS010  No results found for: XNA355  No results found for: DDU202  No results found for: CA2729  No components found for: HGQUANT  No results found for: CEA1 / No results found  for: CEA1   No results found for: AFPTUMOR  No results found for: CHROMOGRNA  No results found for: HGBA, HGBA2QUANT, HGBFQUANT, HGBSQUAN (Hemoglobinopathy evaluation)   No results found for: LDH  No results found for: IRON, TIBC, IRONPCTSAT (Iron and TIBC)  No results found for: FERRITIN  Urinalysis    Component Value Date/Time   BILIRUBINUR Neg 01/18/2012 1458   PROTEINUR Neg 01/18/2012 1458   UROBILINOGEN 0.2 01/18/2012 1458   NITRITE Neg 01/18/2012 1458   LEUKOCYTESUR Negative 01/18/2012 1458     STUDIES: No results found.   ELIGIBLE FOR AVAILABLE RESEARCH PROTOCOL: no  ASSESSMENT: 39 y.o. [place] woman status post right breast upper inner quadrant biopsy on 09/26/2018 for a clinical T2 N0, stage IB invasive ductal carcinoma, grade 2, estrogen and progesterone receptor positive, HER-2 nonamplified, with an MIB-1-1 of 35%  (1) Genetic testing reported on 10/05/2018 through the [facility] Breast Cancer STAT Panel + Common Hereditary cancer panel found no pathogenic mutations. The STAT Breast cancer panel offered by [facility] includes sequencing and rearrangement analysis for the following 9 genes:  ATM, BRCA1, BRCA2, CDH1, CHEK2, PALB2, PTEN, STK11 and TP53. The Common Hereditary Cancers Panel offered by [facility] includes sequencing and/or deletion duplication testing of the following 47 genes: APC, ATM, AXIN2, BARD1, BMPR1A, BRCA1, BRCA2, BRIP1, CDH1, CDKN2A (p14ARF), CDKN2A (p16INK4a), CKD4, CHEK2, CTNNA1, DICER1, EPCAM (Deletion/duplication testing only), GREM1 (promoter region deletion/duplication testing only), KIT, MEN1, MLH1, MSH2, MSH3, MSH6, MUTYH, NBN, NF1, NHTL1, PALB2, PDGFRA, PMS2,  POLD1, POLE, PTEN, RAD50, RAD51C, RAD51D, SDHB, SDHC, SDHD, SMAD4, SMARCA4. STK11, TP53, TSC1, TSC2, and VHL.  The following genes were evaluated for sequence changes only: SDHA and HOXB13 c.251G>A variant only.   (2) MammaPrint obtained from the original breast biopsy returned "high risk" indicating the patient will have an excellent prognosis if she receives adjuvant chemotherapy  (3) status post right lumpectomy and right axillary sentinel lymph node sampling 11/21/2018 for a pT2 pN1, stage IIA invasive ductal carcinoma, grade 2, with negative margins  (a) a total of 4 sentinel lymph nodes removed, 1 with macrometastasis  (b) status post bilateral breast reductions at the time of lumpectomy  (4) adjuvant chemotherapy consisting of cyclophosphamide and doxorubicin in dose dense fashion x4 started 01/05/2019, to be followed by weekly paclitaxel x12  (a) echo 10/27/2018 shows an ejection fraction in the 55-60% range  (5) adjuvant radiation to follow, in [place] per patient's request  (6) tamoxifen started 10/05/2018 in anticipation of surgical delays, discontinued 12/05/2018   PLAN: Kaylee Romero did very well with cycle #2.  Losing her hair was very emotional, but she has got a very becoming hat/wig and is very practical about that as she has been about the entire problem with her diagnosis and treatment.  She should do equally well with this cycle and the next after which of course she will start paclitaxel only and that should make her life a bit easier.  She would like to stay on Thursdays for the Taxol treatments and I have operationalize that.  She had some spotting last week.  She understands that she may well be cycling.  She has an IUD in place so pregnancy is not an issue.  She will likely go on tamoxifen when we complete the chemotherapy.  She can keep the Mirena.  I have gone ahead and corrected the orders for paclitaxel.  She will see me with the first treatment and we will review  supportive treatment  I have encouraged her to sign up for  the COVID-19 vaccine.  We will adjust chemotherapy timing so she has time to make antibodies whenever she does receive it  She knows to call for any other issue that may develop before then.  Total encounter time 30 minutes.*   Jaeshaun Riva, Virgie Dad, MD  02/02/19 8:54 AM Medical Oncology and Hematology [facility] [place], [place] [address] Tel. [phone]    Fax. [phone]   I, Wilburn Mylar, am acting as scribe for Dr. Virgie Dad. Chigozie Basaldua.  I, Lurline Del MD, have reviewed the above documentation for accuracy and completeness, and I agree with the above.   *Total Encounter Time as defined by the Centers for Medicare and Medicaid Services includes, in addition to the face-to-face time of a patient visit (documented in the note above) non-face-to-face time: obtaining and reviewing outside history, ordering and reviewing medications, tests or procedures, care coordination (communications with other health care professionals or caregivers) and documentation in the medical record.

## 2019-02-02 ENCOUNTER — Inpatient Hospital Stay: Payer: BC Managed Care – PPO

## 2019-02-02 ENCOUNTER — Inpatient Hospital Stay (HOSPITAL_BASED_OUTPATIENT_CLINIC_OR_DEPARTMENT_OTHER): Payer: BC Managed Care – PPO | Admitting: Oncology

## 2019-02-02 ENCOUNTER — Other Ambulatory Visit: Payer: Self-pay

## 2019-02-02 ENCOUNTER — Encounter: Payer: Self-pay | Admitting: *Deleted

## 2019-02-02 VITALS — BP 121/80 | HR 93 | Temp 97.8°F | Resp 17 | Ht 64.0 in | Wt 183.1 lb

## 2019-02-02 DIAGNOSIS — Z5111 Encounter for antineoplastic chemotherapy: Secondary | ICD-10-CM | POA: Diagnosis not present

## 2019-02-02 DIAGNOSIS — C50411 Malignant neoplasm of upper-outer quadrant of right female breast: Secondary | ICD-10-CM

## 2019-02-02 DIAGNOSIS — Z95828 Presence of other vascular implants and grafts: Secondary | ICD-10-CM | POA: Insufficient documentation

## 2019-02-02 DIAGNOSIS — Z170 Estrogen receptor positive status [ER+]: Secondary | ICD-10-CM | POA: Diagnosis not present

## 2019-02-02 LAB — CBC WITH DIFFERENTIAL/PLATELET
Abs Immature Granulocytes: 0.71 10*3/uL — ABNORMAL HIGH (ref 0.00–0.07)
Basophils Absolute: 0.1 10*3/uL (ref 0.0–0.1)
Basophils Relative: 1 %
Eosinophils Absolute: 0 10*3/uL (ref 0.0–0.5)
Eosinophils Relative: 0 %
HCT: 33.8 % — ABNORMAL LOW (ref 36.0–46.0)
Hemoglobin: 11.4 g/dL — ABNORMAL LOW (ref 12.0–15.0)
Immature Granulocytes: 7 %
Lymphocytes Relative: 17 %
Lymphs Abs: 1.6 10*3/uL (ref 0.7–4.0)
MCH: 31.7 pg (ref 26.0–34.0)
MCHC: 33.7 g/dL (ref 30.0–36.0)
MCV: 93.9 fL (ref 80.0–100.0)
Monocytes Absolute: 0.5 10*3/uL (ref 0.1–1.0)
Monocytes Relative: 5 %
Neutro Abs: 6.8 10*3/uL (ref 1.7–7.7)
Neutrophils Relative %: 70 %
Platelets: 219 10*3/uL (ref 150–400)
RBC: 3.6 MIL/uL — ABNORMAL LOW (ref 3.87–5.11)
RDW: 13.3 % (ref 11.5–15.5)
WBC: 9.7 10*3/uL (ref 4.0–10.5)
nRBC: 0 % (ref 0.0–0.2)

## 2019-02-02 LAB — COMPREHENSIVE METABOLIC PANEL
ALT: 46 U/L — ABNORMAL HIGH (ref 0–44)
AST: 21 U/L (ref 15–41)
Albumin: 3.8 g/dL (ref 3.5–5.0)
Alkaline Phosphatase: 75 U/L (ref 38–126)
Anion gap: 9 (ref 5–15)
BUN: 12 mg/dL (ref 6–20)
CO2: 26 mmol/L (ref 22–32)
Calcium: 8.5 mg/dL — ABNORMAL LOW (ref 8.9–10.3)
Chloride: 107 mmol/L (ref 98–111)
Creatinine, Ser: 0.63 mg/dL (ref 0.44–1.00)
GFR calc Af Amer: >60 mL/min (ref >60)
GFR calc non Af Amer: >60 mL/min (ref >60)
Glucose, Bld: 90 mg/dL (ref 70–99)
Potassium: 4.4 mmol/L (ref 3.5–5.1)
Sodium: 142 mmol/L (ref 135–145)
Total Bilirubin: <0.2 mg/dL — ABNORMAL LOW (ref 0.3–1.2)
Total Protein: 6.4 g/dL — ABNORMAL LOW (ref 6.5–8.1)

## 2019-02-02 MED ORDER — SODIUM CHLORIDE 0.9% FLUSH
10.0000 mL | INTRAVENOUS | Status: DC | PRN
  Administered 2019-02-02: 10 mL
  Filled 2019-02-02: qty 10

## 2019-02-02 MED ORDER — SODIUM CHLORIDE 0.9 % IV SOLN
Freq: Once | INTRAVENOUS | Status: AC
  Filled 2019-02-02: qty 250

## 2019-02-02 MED ORDER — HEPARIN SOD (PORK) LOCK FLUSH 100 UNIT/ML IV SOLN
500.0000 [IU] | Freq: Once | INTRAVENOUS | Status: AC | PRN
  Administered 2019-02-02: 500 [IU]
  Filled 2019-02-02: qty 5

## 2019-02-02 MED ORDER — SODIUM CHLORIDE 0.9 % IV SOLN
Freq: Once | INTRAVENOUS | Status: AC
  Filled 2019-02-02: qty 5

## 2019-02-02 MED ORDER — DOXORUBICIN HCL CHEMO IV INJECTION 2 MG/ML
60.0000 mg/m2 | Freq: Once | INTRAVENOUS | Status: AC
  Administered 2019-02-02: 114 mg via INTRAVENOUS
  Filled 2019-02-02: qty 57

## 2019-02-02 MED ORDER — METOCLOPRAMIDE HCL 5 MG/ML IJ SOLN
INTRAMUSCULAR | Status: AC
  Filled 2019-02-02: qty 2

## 2019-02-02 MED ORDER — SODIUM CHLORIDE 0.9 % IV SOLN
600.0000 mg/m2 | Freq: Once | INTRAVENOUS | Status: AC
  Administered 2019-02-02: 1140 mg via INTRAVENOUS
  Filled 2019-02-02: qty 57

## 2019-02-02 MED ORDER — SODIUM CHLORIDE 0.9% FLUSH
10.0000 mL | Freq: Once | INTRAVENOUS | Status: AC
  Administered 2019-02-02: 10 mL
  Filled 2019-02-02: qty 10

## 2019-02-02 MED ORDER — METOCLOPRAMIDE HCL 5 MG/ML IJ SOLN
10.0000 mg | Freq: Once | INTRAMUSCULAR | Status: AC
  Administered 2019-02-02: 10 mg via INTRAVENOUS

## 2019-02-02 NOTE — Progress Notes (Signed)
ON PATHWAY REGIMEN - Breast  No Change  Continue With Treatment as Ordered.   Dose-Dense AC q14 days:   A cycle is every 14 days:     Doxorubicin      Cyclophosphamide      Pegfilgrastim-xxxx   **Always confirm dose/schedule in your pharmacy ordering system**  Paclitaxel 80 mg/m2 Weekly:   Administer weekly:     Paclitaxel   **Always confirm dose/schedule in your pharmacy ordering system**  Patient Characteristics: Postoperative without Neoadjuvant Therapy (Pathologic Staging), Invasive Disease, Adjuvant Therapy, HER2 Negative/Unknown/Equivocal, ER Positive, Node Positive, Node Positive (1-3), MammaPrint(R) Ordered, High Genomic Risk Therapeutic Status: Postoperative without Neoadjuvant Therapy (Pathologic Staging) AJCC Grade: GX AJCC N Category: pNX AJCC M Category: cM0 ER Status: Positive (+) AJCC 8 Stage Grouping: IIB HER2 Status: Negative (-) Oncotype Dx Recurrence Score: Ordered Other Genomic Test AJCC T Category: pTX PR Status: Positive (+) Has this patient completed genomic testing<= Yes - MammaPrint(R) MammaPrint(R) Score: High Genomic Risk Intent of Therapy: Curative Intent, Discussed with Patient

## 2019-02-02 NOTE — Patient Instructions (Signed)
[facility] Discharge Instructions for Patients Receiving Chemotherapy  Today you received the following chemotherapy agents: Adriamycin, Cytoxan  To help prevent nausea and vomiting after your treatment, we encourage you to take your nausea medication as directed.   If you develop nausea and vomiting that is not controlled by your nausea medication, call the clinic.   BELOW ARE SYMPTOMS THAT SHOULD BE REPORTED IMMEDIATELY:  *FEVER GREATER THAN 100.5 F  *CHILLS WITH OR WITHOUT FEVER  NAUSEA AND VOMITING THAT IS NOT CONTROLLED WITH YOUR NAUSEA MEDICATION  *UNUSUAL SHORTNESS OF BREATH  *UNUSUAL BRUISING OR BLEEDING  TENDERNESS IN MOUTH AND THROAT WITH OR WITHOUT PRESENCE OF ULCERS  *URINARY PROBLEMS  *BOWEL PROBLEMS  UNUSUAL RASH Items with * indicate a potential emergency and should be followed up as soon as possible.  Feel free to call the clinic should you have any questions or concerns. The clinic phone number is [phone].  Please show the CHEMO ALERT CARD at check-in to the Emergency Department and triage nurse.   

## 2019-02-03 ENCOUNTER — Telehealth: Payer: Self-pay | Admitting: Adult Health

## 2019-02-03 NOTE — Telephone Encounter (Signed)
I left a message regarding schedule  

## 2019-02-04 ENCOUNTER — Inpatient Hospital Stay: Payer: BC Managed Care – PPO

## 2019-02-04 ENCOUNTER — Telehealth: Payer: Self-pay

## 2019-02-04 NOTE — Telephone Encounter (Signed)
RN placed call to patient to inquire about apt scheduled for 1/30.  Pt states, "I don't feel well today, how important is this injection?"  RN educated on importance of injection.  Pt reports feeling extremely fatigued, weakness, decrease in appetite, and nausea.  Pt denies any fever, SHOB, or chest pain.   RN encouraged used of anti-emetics - pt is attempting small meals and is able to tolerate fluids. Re-scheduled injection apt for 2/1 -pt aware.  RN educated patient on s/s of when to notify on call.  Pt verbalized understanding.

## 2019-02-06 ENCOUNTER — Other Ambulatory Visit: Payer: Self-pay

## 2019-02-06 ENCOUNTER — Inpatient Hospital Stay: Payer: BC Managed Care – PPO | Attending: Oncology

## 2019-02-06 ENCOUNTER — Other Ambulatory Visit: Payer: Self-pay | Admitting: Oncology

## 2019-02-06 VITALS — BP 122/78 | HR 86 | Temp 98.2°F | Resp 18

## 2019-02-06 DIAGNOSIS — C50411 Malignant neoplasm of upper-outer quadrant of right female breast: Secondary | ICD-10-CM

## 2019-02-06 DIAGNOSIS — Z170 Estrogen receptor positive status [ER+]: Secondary | ICD-10-CM | POA: Diagnosis not present

## 2019-02-06 DIAGNOSIS — K5900 Constipation, unspecified: Secondary | ICD-10-CM | POA: Diagnosis not present

## 2019-02-06 DIAGNOSIS — Z5189 Encounter for other specified aftercare: Secondary | ICD-10-CM | POA: Diagnosis not present

## 2019-02-06 DIAGNOSIS — C50811 Malignant neoplasm of overlapping sites of right female breast: Secondary | ICD-10-CM | POA: Insufficient documentation

## 2019-02-06 DIAGNOSIS — Z5111 Encounter for antineoplastic chemotherapy: Secondary | ICD-10-CM | POA: Insufficient documentation

## 2019-02-06 MED ORDER — PEGFILGRASTIM-JMDB 6 MG/0.6ML ~~LOC~~ SOSY
6.0000 mg | PREFILLED_SYRINGE | Freq: Once | SUBCUTANEOUS | Status: AC
  Administered 2019-02-06: 6 mg via SUBCUTANEOUS

## 2019-02-06 MED ORDER — PEGFILGRASTIM-JMDB 6 MG/0.6ML ~~LOC~~ SOSY
PREFILLED_SYRINGE | SUBCUTANEOUS | Status: AC
  Filled 2019-02-06: qty 0.6

## 2019-02-06 NOTE — Patient Instructions (Signed)

## 2019-02-08 ENCOUNTER — Encounter: Payer: Self-pay | Admitting: *Deleted

## 2019-02-08 ENCOUNTER — Other Ambulatory Visit: Payer: Self-pay | Admitting: Oncology

## 2019-02-08 DIAGNOSIS — C50411 Malignant neoplasm of upper-outer quadrant of right female breast: Secondary | ICD-10-CM

## 2019-02-08 DIAGNOSIS — Z170 Estrogen receptor positive status [ER+]: Secondary | ICD-10-CM

## 2019-02-15 ENCOUNTER — Encounter: Payer: Self-pay | Admitting: Adult Health

## 2019-02-15 ENCOUNTER — Inpatient Hospital Stay: Payer: BC Managed Care – PPO

## 2019-02-15 ENCOUNTER — Inpatient Hospital Stay (HOSPITAL_BASED_OUTPATIENT_CLINIC_OR_DEPARTMENT_OTHER): Payer: BC Managed Care – PPO | Admitting: Adult Health

## 2019-02-15 ENCOUNTER — Other Ambulatory Visit: Payer: Self-pay

## 2019-02-15 VITALS — BP 120/73 | HR 96 | Temp 98.9°F | Resp 18 | Ht 64.0 in | Wt 184.8 lb

## 2019-02-15 DIAGNOSIS — Z170 Estrogen receptor positive status [ER+]: Secondary | ICD-10-CM

## 2019-02-15 DIAGNOSIS — C50411 Malignant neoplasm of upper-outer quadrant of right female breast: Secondary | ICD-10-CM

## 2019-02-15 DIAGNOSIS — Z5111 Encounter for antineoplastic chemotherapy: Secondary | ICD-10-CM | POA: Diagnosis not present

## 2019-02-15 DIAGNOSIS — Z95828 Presence of other vascular implants and grafts: Secondary | ICD-10-CM

## 2019-02-15 LAB — CBC WITH DIFFERENTIAL/PLATELET
Abs Immature Granulocytes: 1.82 10*3/uL — ABNORMAL HIGH (ref 0.00–0.07)
Basophils Absolute: 0.1 10*3/uL (ref 0.0–0.1)
Basophils Relative: 1 %
Eosinophils Absolute: 0 10*3/uL (ref 0.0–0.5)
Eosinophils Relative: 0 %
HCT: 32.7 % — ABNORMAL LOW (ref 36.0–46.0)
Hemoglobin: 11 g/dL — ABNORMAL LOW (ref 12.0–15.0)
Immature Granulocytes: 13 %
Lymphocytes Relative: 12 %
Lymphs Abs: 1.6 10*3/uL (ref 0.7–4.0)
MCH: 32 pg (ref 26.0–34.0)
MCHC: 33.6 g/dL (ref 30.0–36.0)
MCV: 95.1 fL (ref 80.0–100.0)
Monocytes Absolute: 0.9 10*3/uL (ref 0.1–1.0)
Monocytes Relative: 6 %
Neutro Abs: 9.3 10*3/uL — ABNORMAL HIGH (ref 1.7–7.7)
Neutrophils Relative %: 68 %
Platelets: 210 10*3/uL (ref 150–400)
RBC: 3.44 MIL/uL — ABNORMAL LOW (ref 3.87–5.11)
RDW: 15.6 % — ABNORMAL HIGH (ref 11.5–15.5)
WBC: 13.8 10*3/uL — ABNORMAL HIGH (ref 4.0–10.5)
nRBC: 0.2 % (ref 0.0–0.2)

## 2019-02-15 LAB — COMPREHENSIVE METABOLIC PANEL
ALT: 44 U/L (ref 0–44)
AST: 22 U/L (ref 15–41)
Albumin: 3.9 g/dL (ref 3.5–5.0)
Alkaline Phosphatase: 77 U/L (ref 38–126)
Anion gap: 11 (ref 5–15)
BUN: 12 mg/dL (ref 6–20)
CO2: 26 mmol/L (ref 22–32)
Calcium: 8.7 mg/dL — ABNORMAL LOW (ref 8.9–10.3)
Chloride: 104 mmol/L (ref 98–111)
Creatinine, Ser: 0.63 mg/dL (ref 0.44–1.00)
GFR calc Af Amer: >60 mL/min (ref >60)
GFR calc non Af Amer: >60 mL/min (ref >60)
Glucose, Bld: 116 mg/dL — ABNORMAL HIGH (ref 70–99)
Potassium: 4 mmol/L (ref 3.5–5.1)
Sodium: 141 mmol/L (ref 135–145)
Total Bilirubin: <0.2 mg/dL — ABNORMAL LOW (ref 0.3–1.2)
Total Protein: 6.6 g/dL (ref 6.5–8.1)

## 2019-02-15 MED ORDER — SODIUM CHLORIDE 0.9 % IV SOLN
Freq: Once | INTRAVENOUS | Status: AC
  Filled 2019-02-15: qty 250

## 2019-02-15 MED ORDER — SODIUM CHLORIDE 0.9% FLUSH
10.0000 mL | Freq: Once | INTRAVENOUS | Status: AC
  Administered 2019-02-15: 10 mL
  Filled 2019-02-15: qty 10

## 2019-02-15 MED ORDER — HEPARIN SOD (PORK) LOCK FLUSH 100 UNIT/ML IV SOLN
500.0000 [IU] | Freq: Once | INTRAVENOUS | Status: AC | PRN
  Administered 2019-02-15: 500 [IU]
  Filled 2019-02-15: qty 5

## 2019-02-15 MED ORDER — DOXORUBICIN HCL CHEMO IV INJECTION 2 MG/ML
60.0000 mg/m2 | Freq: Once | INTRAVENOUS | Status: AC
  Administered 2019-02-15: 114 mg via INTRAVENOUS
  Filled 2019-02-15: qty 57

## 2019-02-15 MED ORDER — HEPARIN SOD (PORK) LOCK FLUSH 100 UNIT/ML IV SOLN
500.0000 [IU] | Freq: Once | INTRAVENOUS | Status: DC
  Filled 2019-02-15: qty 5

## 2019-02-15 MED ORDER — SODIUM CHLORIDE 0.9 % IV SOLN
Freq: Once | INTRAVENOUS | Status: AC
  Filled 2019-02-15: qty 5

## 2019-02-15 MED ORDER — SODIUM CHLORIDE 0.9% FLUSH
10.0000 mL | INTRAVENOUS | Status: DC | PRN
  Administered 2019-02-15: 10 mL
  Filled 2019-02-15: qty 10

## 2019-02-15 MED ORDER — METOCLOPRAMIDE HCL 5 MG/ML IJ SOLN
10.0000 mg | Freq: Once | INTRAMUSCULAR | Status: AC
  Administered 2019-02-15: 10 mg via INTRAVENOUS

## 2019-02-15 MED ORDER — SODIUM CHLORIDE 0.9 % IV SOLN
600.0000 mg/m2 | Freq: Once | INTRAVENOUS | Status: AC
  Administered 2019-02-15: 1140 mg via INTRAVENOUS
  Filled 2019-02-15: qty 57

## 2019-02-15 MED ORDER — METOCLOPRAMIDE HCL 5 MG/ML IJ SOLN
INTRAMUSCULAR | Status: AC
  Filled 2019-02-15: qty 2

## 2019-02-15 NOTE — Addendum Note (Signed)
Addended by: Chauncey Cruel on: 02/15/2019 10:57 AM   Modules accepted: Orders

## 2019-02-15 NOTE — Progress Notes (Signed)
Kaylee Romero  [phone] [phone]     ID: Kaylee Romero DOB: Nov 13, 1980  MR#: [id number]  WLS#:[id number]  Patient Care Team: Linda Hedges, DO as PCP - General (Obstetrics and Gynecology) Mauro Kaufmann, RN as Oncology Nurse Navigator Rockwell Germany, RN as Oncology Nurse Navigator Gery Pray, MD as Consulting Physician (Radiation Oncology) Magrinat, Virgie Dad, MD as Consulting Physician (Oncology) Jovita Kussmaul, MD as Consulting Physician (General Surgery) Dillingham, Loel Lofty, DO as Attending Physician (Plastic Surgery) Scot Dock, NP OTHER MD:  CHIEF COMPLAINT: estrogen receptor positive breast cancer  CURRENT TREATMENT: Adjuvant chemotherapy   INTERVAL HISTORY: Kaylee Romero  returns today for follow-up of her estrogen receptor positive breast cancer.   She continues on adjuvant chemotherapy consisting of cyclophosphamide and doxorubicin. Today is day 1 cycle 4.    Her most recent echocardiogram on 10/27/2018 showed an ejection fraction of 55-60%.  REVIEW OF SYSTEMS: Kaylee Romero is doing moderately well today.  She notes fatigue the first two days following treatment which make it difficult to do anything.   She has also noted a weight gain since starting chemotherapy. 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    Kaylee Romero also struggles with constipation, and is taking miralax which helps some.  She does have intermittent nausea, and this is relived by Compazine.  Kaylee Romero denies fever, chills, chest pain, palpitations, cough, shortness of breath, headaches, vision issues, bladder changes, or any other concerns.    HISTORY OF CURRENT ILLNESS: From the original intake note:  Kaylee Romero (pronounced "Bram-Hall") found a palpable lump in the 12 o'clock position of the right breast, which he brought to the attention of her gynecologist.. She underwent bilateral diagnostic  mammography with tomography and bilateral breast ultrasonography at [facility] on 09/23/2018 showing: breast density category C; a spiculated mass with associated with architectural distortion and pleomorphic calcifications lying in the 11-12 o'clock position, middle depth, measuring 2 cm in size, corresponding to the palpable abnormality.   Physical exam confirmed a firm but mobile mass in the 12 o'clock position of the right breast.  By ultrasound there was a highly suspicious 2.1 cm mass in the 12 o'clock position of the right breast; no other evidence of breast malignancy or lymphadenopathy bilaterally.Sonographic evaluation of the right axilla found no enlarged or abnormal appearing lymph node  In the left axilla there were several lymph nodes with areas of cortical prominence similar to the lymph node noted on the right.  Given the symmetry all these are presumed to be reactive  Accordingly on 09/26/2018 she proceeded to biopsy of the right breast area in question. The pathology from this procedure ([id number]) showed: invasive ductal carcinoma, grade 2; prognostic indicators significant for: estrogen receptor, 80% positive with moderate staining intensity and progesterone receptor, 90% positive with strong staining intensity. Proliferation marker Ki67 at 35%. HER2 negative by immunohistochemistry (1+).  The patient's subsequent history is as detailed below.   PAST MEDICAL HISTORY: Past Medical History:  Diagnosis Date  . Anxiety   . Family history of breast cancer   . HSV (herpes simplex virus) anogenital infection    positive titer only    PAST SURGICAL HISTORY: Past Surgical History:  Procedure Laterality Date  . BREAST LUMPECTOMY WITH AXILLARY LYMPH NODE BIOPSY Right 11/21/2018   Procedure: RIGHT BREAST LUMPECTOMY WITH SENTINEL LYMPH NODE BIOPSY;  Surgeon: Jovita Kussmaul, MD;  Location: [facility];  Service: General;  Laterality: Right;  . BREAST REDUCTION SURGERY Bilateral  11/21/2018   Procedure: BILATERAL MAMMARY REDUCTION  (BREAST);  Surgeon: Wallace Going, DO;  Location: [facility];  Service: Plastics;  Laterality: Bilateral;  . PORTACATH PLACEMENT N/A 11/21/2018   Procedure: INSERTION LEFT PORT-A-CATH WITH ULTRASOUND GUIDANCE;  Surgeon: Jovita Kussmaul, MD;  Location: [facility];  Service: General;  Laterality: N/A;  . TONSILLECTOMY  1987  . TONSILLECTOMY    . WISDOM TOOTH EXTRACTION      FAMILY HISTORY: Family History  Problem Relation Age of Onset  . Hypertension Father   . Alcohol abuse Paternal Grandfather   . Heart disease Paternal Grandfather   . Alcohol abuse Maternal Grandfather   . Heart disease Maternal Grandmother   . Breast cancer Other    Patient's parents are both living at age 2, as of 09/2018. Her father lives in [place], and her mother lives in [place], [place]. The patient denies a family hx of ovarian cancer. A maternal great aunt was diagnosed with breast cancer twice. She has 2 brothers, no sisters   GYNECOLOGIC HISTORY:  No LMP recorded. (Menstrual status: Chemotherapy). Menarche: 39 years old Age at first live birth: 38 years old Kaylee Romero 1 Currently having regular periods  Contraceptive: Mirena IUD in place HRT n/a  Hysterectomy? no BSO? no   SOCIAL HISTORY: (updated January 2021) Kaylee Romero is a [employer]nd [employer]. Husband Kaylee Romero is in management with [employer]. [name] is 15 months old.     ADVANCED DIRECTIVES: not in place. Her husband is automatically her HCPOA.   HEALTH MAINTENANCE: Social History   Tobacco Use  . Smoking status: Former Smoker    Packs/day: 0.50    Years: 10.00    Pack years: 5.00    Types: Cigarettes    Start date: 01/13/2002    Quit date: 10/17/2016    Years since quitting: 2.3  . Smokeless tobacco: Never Used  Substance Use Topics  . Alcohol use: Yes    Comment: Socially  . Drug use: No     Colonoscopy: n/a  PAP: 09/20/2018  Bone density: n/a   Allergies  Allergen Reactions  .  Chlorhexidine Gluconate Rash    Current Outpatient Medications  Medication Sig Dispense Refill  . acetaminophen (TYLENOL) 500 MG tablet Take 1 tablet (500 mg total) by mouth every 6 (six) hours as needed. 60 tablet 0  . amphetamine-dextroamphetamine (ADDERALL XR) 20 MG 24 hr capsule Take 20 mg by mouth daily.    . Ascorbic Acid (VITAMIN C) 1000 MG tablet Take 1,000 mg by mouth daily.    . citalopram (CELEXA) 20 MG tablet Take 20 mg by mouth at bedtime.     . clobetasol cream (TEMOVATE) 3.23 % Apply 1 application topically daily.    Marland Kitchen dexamethasone (DECADRON) 4 MG tablet Take 2 tablets by mouth once a day on the day after chemotherapy and then take 2 tablets two times a day for 2 days. Take with food. 30 tablet 1  . hydrOXYzine (ATARAX/VISTARIL) 10 MG tablet Take 10 mg by mouth at bedtime.     Marland Kitchen ibuprofen (ADVIL) 800 MG tablet Take 1 tablet (800 mg total) by mouth every 8 (eight) hours as needed. 30 tablet 0  . lidocaine-prilocaine (EMLA) cream Apply to affected area once 30 g 3  . loratadine (CLARITIN) 10 MG tablet Take 1 tablet (10 mg total) by mouth daily. 60 tablet 3  . LORazepam (ATIVAN) 0.5 MG tablet  TAKE 1 TABLET (0.5 MG TOTAL) BY MOUTH AT BEDTIME AS NEEDED (NAUSEA OR VOMITING). 30 tablet 0  . Multiple Vitamin (MULTIVITAMIN WITH MINERALS) TABS tablet Take 1 tablet by mouth daily.    . naproxen sodium (ALEVE) 220 MG tablet Take 1 tablet (220 mg total) by mouth 3 (three) times daily with meals. 60 tablet 3  . prochlorperazine (COMPAZINE) 10 MG tablet Take 1 tablet (10 mg total) by mouth every 6 (six) hours as needed (Nausea or vomiting). 30 tablet 1  . Tetrahydrozoline HCl (VISINE OP) Place 1 drop into both eyes daily as needed (redness).    . triamcinolone (KENALOG) 0.025 % ointment Apply 1 application topically 2 (two) times daily. 30 g 0  . vitamin B-12 (CYANOCOBALAMIN) 1000 MCG tablet Take 1,000 mcg by mouth daily.    Marland Kitchen ibuprofen (ADVIL) 600 MG tablet Take 600 mg by mouth every 8  (eight) hours as needed.     No current facility-administered medications for this visit.    OBJECTIVE:   Vitals:   02/15/19 0953  BP: 120/73  Pulse: 96  Resp: 18  Temp: 98.9 F (37.2 C)  SpO2: 100%   Wt Readings from Last 3 Encounters:  02/15/19 184 lb 12.8 oz (83.8 kg)  02/02/19 183 lb 1.6 oz (83.1 kg)  01/19/19 182 lb 1.6 oz (82.6 kg)   Body mass index is 31.72 kg/m.    ECOG FS:1 - Symptomatic but completely ambulatory GENERAL: Patient is a well appearing female in no acute distress HEENT:  Sclerae anicteric.  Mask in place. Neck is supple.  NODES:  No cervical, supraclavicular, or axillary lymphadenopathy palpated.  BREAST EXAM:  Deferred. LUNGS:  Clear to auscultation bilaterally.  No wheezes or rhonchi. HEART:  Regular rate and rhythm. No murmur appreciated. ABDOMEN:  Soft, nontender.  Positive, normoactive bowel sounds. No organomegaly palpated. MSK:  No focal spinal tenderness to palpation. Full range of motion bilaterally in the upper extremities. EXTREMITIES:  No peripheral edema.   SKIN:  Clear with no obvious rashes or skin changes. No nail dyscrasia. NEURO:  Nonfocal. Well oriented.  Appropriate affect.    LAB RESULTS:  CMP     Component Value Date/Time   NA 141 02/15/2019 0925   K 4.0 02/15/2019 0925   CL 104 02/15/2019 0925   CO2 26 02/15/2019 0925   GLUCOSE 116 (H) 02/15/2019 0925   BUN 12 02/15/2019 0925   CREATININE 0.63 02/15/2019 0925   CREATININE 0.68 10/05/2018 1244   CALCIUM 8.7 (L) 02/15/2019 0925   PROT 6.6 02/15/2019 0925   ALBUMIN 3.9 02/15/2019 0925   AST 22 02/15/2019 0925   AST 15 10/05/2018 1244   ALT 44 02/15/2019 0925   ALT 18 10/05/2018 1244   ALKPHOS 77 02/15/2019 0925   BILITOT <0.2 (L) 02/15/2019 0925   BILITOT 0.5 10/05/2018 1244   GFRNONAA >60 02/15/2019 0925   GFRNONAA >60 10/05/2018 1244   GFRAA >60 02/15/2019 0925   GFRAA >60 10/05/2018 1244    No results found for: TOTALPROTELP, ALBUMINELP, A1GS, A2GS,  BETS, BETA2SER, GAMS, MSPIKE, SPEI  No results found for: KPAFRELGTCHN, LAMBDASER, KAPLAMBRATIO  Lab Results  Component Value Date   WBC 13.8 (H) 02/15/2019   NEUTROABS 9.3 (H) 02/15/2019   HGB 11.0 (L) 02/15/2019   HCT 32.7 (L) 02/15/2019   MCV 95.1 02/15/2019   PLT 210 02/15/2019    No results found for: LABCA2  No components found for: [id number]  No results for input(s): INR in the last  168 hours.  No results found for: LABCA2  No results found for: OEU235  No results found for: TIR443  No results found for: XVQ008  No results found for: CA2729  No components found for: HGQUANT  No results found for: CEA1 / No results found for: CEA1   No results found for: AFPTUMOR  No results found for: CHROMOGRNA  No results found for: HGBA, HGBA2QUANT, HGBFQUANT, HGBSQUAN (Hemoglobinopathy evaluation)   No results found for: LDH  No results found for: IRON, TIBC, IRONPCTSAT (Iron and TIBC)  No results found for: FERRITIN  Urinalysis    Component Value Date/Time   BILIRUBINUR Neg 01/18/2012 1458   PROTEINUR Neg 01/18/2012 1458   UROBILINOGEN 0.2 01/18/2012 1458   NITRITE Neg 01/18/2012 1458   LEUKOCYTESUR Negative 01/18/2012 1458     STUDIES: No results found.   ELIGIBLE FOR AVAILABLE RESEARCH PROTOCOL: no  ASSESSMENT: 39 y.o. [place] woman status post right breast upper inner quadrant biopsy on 09/26/2018 for a clinical T2 N0, stage IB invasive ductal carcinoma, grade 2, estrogen and progesterone receptor positive, HER-2 nonamplified, with an MIB-1-1 of 35%  (1) Genetic testing reported on 10/05/2018 through the [facility] Breast Cancer STAT Panel + Common Hereditary cancer panel found no pathogenic mutations. The STAT Breast cancer panel offered by [facility] includes sequencing and rearrangement analysis for the following 9 genes:  ATM, BRCA1, BRCA2, CDH1, CHEK2, PALB2, PTEN, STK11 and TP53. The Common Hereditary Cancers Panel offered by [facility] includes  sequencing and/or deletion duplication testing of the following 47 genes: APC, ATM, AXIN2, BARD1, BMPR1A, BRCA1, BRCA2, BRIP1, CDH1, CDKN2A (p14ARF), CDKN2A (p16INK4a), CKD4, CHEK2, CTNNA1, DICER1, EPCAM (Deletion/duplication testing only), GREM1 (promoter region deletion/duplication testing only), KIT, MEN1, MLH1, MSH2, MSH3, MSH6, MUTYH, NBN, NF1, NHTL1, PALB2, PDGFRA, PMS2, POLD1, POLE, PTEN, RAD50, RAD51C, RAD51D, SDHB, SDHC, SDHD, SMAD4, SMARCA4. STK11, TP53, TSC1, TSC2, and VHL.  The following genes were evaluated for sequence changes only: SDHA and HOXB13 c.251G>A variant only.   (2) MammaPrint obtained from the original breast biopsy returned "high risk" indicating the patient will have an excellent prognosis if she receives adjuvant chemotherapy  (3) status post right lumpectomy and right axillary sentinel lymph node sampling 11/21/2018 for a pT2 pN1, stage IIA invasive ductal carcinoma, grade 2, with negative margins  (a) a total of 4 sentinel lymph nodes removed, 1 with macrometastasis  (b) status post bilateral breast reductions at the time of lumpectomy  (4) adjuvant chemotherapy consisting of cyclophosphamide and doxorubicin in dose dense fashion x4 started 01/05/2019, to be followed by weekly paclitaxel x12  (a) echo 10/27/2018 shows an ejection fraction in the 55-60% range  (5) adjuvant radiation to follow, in [place] per patient's request  (6) tamoxifen started 10/05/2018 in anticipation of surgical delays, discontinued 12/05/2018   PLAN: Kaylee Romero continues on adjuvant chemotherapy and will finish with her fourth cycle of doxorubicin and cyclophosphamide today.  Her CBC is normal, and CMET is pending.  Kaylee Romero does have some constipation with treatment, and I suggested she take stool softeners, Colace as many as 2 BID and drink increased water.  This may help bring more water in the stool and help its passage easier, as the anti emetics are drying and can help lead to the  constipation.    Kaylee Romero and I reviewed the next chemotherapy regimen with weekly Paclitaxel as well, along with her hormonal status.    Kaylee Romero will return in 2 weeks for labs, f/u, and to start weekly paclitaxel.  She was recommended to  continue with the appropriate pandemic precautions. She knows to call for any questions that may arise between now and her next appointment.  We are happy to see her sooner if needed.  Total encounter time 30 minutes.Wilber Bihari, NP  02/15/19 10:21 AM Medical Oncology and Hematology [facility] [place], [place] [address] Tel. [phone]    Fax. [phone]   *Total Encounter Time as defined by the Centers for Medicare and Medicaid Services includes, in addition to the face-to-face time of a patient visit (documented in the note above) non-face-to-face time: obtaining and reviewing outside history, ordering and reviewing medications, tests or procedures, care coordination (communications with other health care professionals or caregivers) and documentation in the medical record.

## 2019-02-15 NOTE — Patient Instructions (Signed)
[facility] Discharge Instructions for Patients Receiving Chemotherapy  Today you received the following chemotherapy agents: doxorubicin and cyclophosphamide.  To help prevent nausea and vomiting after your treatment, we encourage you to take your nausea medication as directed.   If you develop nausea and vomiting that is not controlled by your nausea medication, call the clinic.   BELOW ARE SYMPTOMS THAT SHOULD BE REPORTED IMMEDIATELY:  *FEVER GREATER THAN 100.5 F  *CHILLS WITH OR WITHOUT FEVER  NAUSEA AND VOMITING THAT IS NOT CONTROLLED WITH YOUR NAUSEA MEDICATION  *UNUSUAL SHORTNESS OF BREATH  *UNUSUAL BRUISING OR BLEEDING  TENDERNESS IN MOUTH AND THROAT WITH OR WITHOUT PRESENCE OF ULCERS  *URINARY PROBLEMS  *BOWEL PROBLEMS  UNUSUAL RASH Items with * indicate a potential emergency and should be followed up as soon as possible.  Feel free to call the clinic should you have any questions or concerns. The clinic phone number is [phone].  Please show the CHEMO ALERT CARD at check-in to the Emergency Department and triage nurse.   

## 2019-02-16 ENCOUNTER — Ambulatory Visit: Payer: BC Managed Care – PPO | Admitting: Adult Health

## 2019-02-17 ENCOUNTER — Inpatient Hospital Stay: Payer: BC Managed Care – PPO

## 2019-02-17 ENCOUNTER — Other Ambulatory Visit: Payer: Self-pay

## 2019-02-17 VITALS — BP 121/71 | HR 85 | Temp 98.9°F | Resp 16

## 2019-02-17 DIAGNOSIS — Z170 Estrogen receptor positive status [ER+]: Secondary | ICD-10-CM

## 2019-02-17 DIAGNOSIS — Z5111 Encounter for antineoplastic chemotherapy: Secondary | ICD-10-CM | POA: Diagnosis not present

## 2019-02-17 DIAGNOSIS — C50411 Malignant neoplasm of upper-outer quadrant of right female breast: Secondary | ICD-10-CM

## 2019-02-17 MED ORDER — PEGFILGRASTIM-JMDB 6 MG/0.6ML ~~LOC~~ SOSY
PREFILLED_SYRINGE | SUBCUTANEOUS | Status: AC
  Filled 2019-02-17: qty 0.6

## 2019-02-17 MED ORDER — PEGFILGRASTIM-JMDB 6 MG/0.6ML ~~LOC~~ SOSY
6.0000 mg | PREFILLED_SYRINGE | Freq: Once | SUBCUTANEOUS | Status: AC
  Administered 2019-02-17: 16:00:00 6 mg via SUBCUTANEOUS

## 2019-02-17 NOTE — Patient Instructions (Signed)

## 2019-02-22 ENCOUNTER — Telehealth: Payer: Self-pay | Admitting: Oncology

## 2019-02-22 NOTE — Telephone Encounter (Signed)
LVM for patient o return call in reference to FMLA, need to know if it is intermittent or continuous and beginning and end date for [facility]

## 2019-02-28 ENCOUNTER — Other Ambulatory Visit: Payer: Self-pay

## 2019-02-28 ENCOUNTER — Encounter: Payer: Self-pay | Admitting: Plastic Surgery

## 2019-02-28 ENCOUNTER — Ambulatory Visit (INDEPENDENT_AMBULATORY_CARE_PROVIDER_SITE_OTHER): Payer: BC Managed Care – PPO | Admitting: Plastic Surgery

## 2019-02-28 VITALS — BP 136/90 | HR 86 | Temp 98.4°F | Ht 64.0 in | Wt 180.0 lb

## 2019-02-28 DIAGNOSIS — Z9889 Other specified postprocedural states: Secondary | ICD-10-CM | POA: Insufficient documentation

## 2019-02-28 DIAGNOSIS — Z170 Estrogen receptor positive status [ER+]: Secondary | ICD-10-CM

## 2019-02-28 DIAGNOSIS — C50411 Malignant neoplasm of upper-outer quadrant of right female breast: Secondary | ICD-10-CM

## 2019-02-28 NOTE — Progress Notes (Signed)
   Subjective:    Patient ID: Kaylee Romero, female    DOB: 08-27-1980, 39 y.o.   MRN: [id number]  The patient is a 39 year old female here for postoperative follow-up after breast surgery.  The patient had right breast cancer.  She underwent a partial mastectomy on the right with bilateral breast reduction for oncoplastic reconstruction on the right and symmetry of the left.  This was done in November.  All her incisions are healing very nicely.  She is planning on starting radiation.  She asked about means of reducing her abdominal adiposity.  A sugar-free challenge was recommended.      Review of Systems  Constitutional: Negative.   HENT: Negative.   Eyes: Negative.   Cardiovascular: Negative.   Gastrointestinal: Negative.   Genitourinary: Negative.   Musculoskeletal: Negative.        Objective:   Physical Exam Vitals and nursing note reviewed.  Constitutional:      Appearance: Normal appearance.  Cardiovascular:     Rate and Rhythm: Normal rate.     Pulses: Normal pulses.  Pulmonary:     Effort: Pulmonary effort is normal.  Skin:    Capillary Refill: Capillary refill takes less than 2 seconds.  Neurological:     General: No focal deficit present.     Mental Status: She is alert.        Assessment & Plan:     ICD-10-CM   1. Malignant neoplasm of upper-outer quadrant of right breast in female, estrogen receptor positive ([place])  C50.411    Z17.0   2. Hx of breast surgery  Z98.890    Can start to wear regular no wire bras.  If there is any discomfort I recommend going back to the sports bra.  I would like to see her back in 6 months.   Pictures were obtained of the patient and placed in the chart with the patient's or guardian's permission.  c

## 2019-03-01 NOTE — Progress Notes (Signed)
[place]  [phone] [phone]     ID: Kaylee Romero DOB: [birth year]  MR#: [id number]  POL#:[id number]  Patient Care Team: Linda Hedges, DO as PCP - General (Obstetrics and Gynecology) Mauro Kaufmann, RN as Oncology Nurse Navigator Rockwell Germany, RN as Oncology Nurse Navigator Gery Pray, MD as Consulting Physician (Radiation Oncology) Magrinat, Virgie Dad, MD as Consulting Physician (Oncology) Jovita Kussmaul, MD as Consulting Physician (General Surgery) Dillingham, Loel Lofty, DO as Attending Physician (Plastic Surgery) Scot Dock, NP OTHER MD:  CHIEF COMPLAINT: estrogen receptor positive breast cancer  CURRENT TREATMENT: Adjuvant chemotherapy   INTERVAL HISTORY: Kaylee Romero  returns today for follow-up of her estrogen receptor positive breast cancer.   She completed four cycles of adjuvant chemotherapy with Doxorubicin and Cyclophosphamide and will now proceed to weekly Paclitaxel.  Today is her first week.    Her most recent echocardiogram on 10/27/2018 showed an ejection fraction of 55-60%.  REVIEW OF SYSTEMS: Kaylee Romero is doing well today.  She is happy to have completed her initial four treatments with Doxorubicin and Cyclophosphamide.  She wants to know what to expect with the weekly Paclitaxel.  She is fatigued, and that makes it difficult for her to exercise.    Kaylee Romero denies any fever, chills, chest pain, palpitations, cough, shortness of breath, vision issues, headaches, nausea, vomiting, or bowel/bladder concerns.  A detailed ROS was otherwise non contributory.    HISTORY OF CURRENT ILLNESS: From the original intake note:  Kaylee Romero (pronounced "Bram-Hall") found a palpable lump in the 12 o'clock position of the right breast, which he brought to the attention of her gynecologist.. She underwent bilateral diagnostic mammography with tomography and bilateral breast ultrasonography at [facility] on 09/23/2018 showing:  breast density category C; a spiculated mass with associated with architectural distortion and pleomorphic calcifications lying in the 11-12 o'clock position, middle depth, measuring 2 cm in size, corresponding to the palpable abnormality.   Physical exam confirmed a firm but mobile mass in the 12 o'clock position of the right breast.  By ultrasound there was a highly suspicious 2.1 cm mass in the 12 o'clock position of the right breast; no other evidence of breast malignancy or lymphadenopathy bilaterally.Sonographic evaluation of the right axilla found no enlarged or abnormal appearing lymph node  In the left axilla there were several lymph nodes with areas of cortical prominence similar to the lymph node noted on the right.  Given the symmetry all these are presumed to be reactive  Accordingly on 09/26/2018 she proceeded to biopsy of the right breast area in question. The pathology from this procedure ([id number]) showed: invasive ductal carcinoma, grade 2; prognostic indicators significant for: estrogen receptor, 80% positive with moderate staining intensity and progesterone receptor, 90% positive with strong staining intensity. Proliferation marker Ki67 at 35%. HER2 negative by immunohistochemistry (1+).  The patient's subsequent history is as detailed below.   PAST MEDICAL HISTORY: Past Medical History:  Diagnosis Date  . Anxiety   . Family history of breast cancer   . HSV (herpes simplex virus) anogenital infection    positive titer only    PAST SURGICAL HISTORY: Past Surgical History:  Procedure Laterality Date  . BREAST LUMPECTOMY WITH AXILLARY LYMPH NODE BIOPSY Right 11/21/2018   Procedure: RIGHT BREAST LUMPECTOMY WITH SENTINEL LYMPH NODE BIOPSY;  Surgeon: Jovita Kussmaul, MD;  Location: [facility];  Service: General;  Laterality: Right;  . BREAST REDUCTION SURGERY Bilateral 11/21/2018   Procedure: BILATERAL MAMMARY  REDUCTION  (BREAST);  Surgeon: Wallace Going, DO;  Location: [facility];  Service: Plastics;  Laterality: Bilateral;  . PORTACATH PLACEMENT N/A 11/21/2018   Procedure: INSERTION LEFT PORT-A-CATH WITH ULTRASOUND GUIDANCE;  Surgeon: Jovita Kussmaul, MD;  Location: [facility];  Service: General;  Laterality: N/A;  . TONSILLECTOMY  1987  . TONSILLECTOMY    . WISDOM TOOTH EXTRACTION      FAMILY HISTORY: Family History  Problem Relation Age of Onset  . Hypertension Father   . Alcohol abuse Paternal Grandfather   . Heart disease Paternal Grandfather   . Alcohol abuse Maternal Grandfather   . Heart disease Maternal Grandmother   . Breast cancer Other    Patient's parents are both living at age 55, as of 09/2018. Her father lives in [place], and her mother lives in [place], [place]. The patient denies a family hx of ovarian cancer. A maternal great aunt was diagnosed with breast cancer twice. She has 2 brothers, no sisters   GYNECOLOGIC HISTORY:  No LMP recorded. (Menstrual status: Chemotherapy). Menarche: 39 years old Age at first live birth: 39 years old [place] P 1 Currently having regular periods  Contraceptive: Mirena IUD in place HRT n/a  Hysterectomy? no BSO? no   SOCIAL HISTORY: (updated January 2021) Kaylee Romero is a [employer]. Husband Kaylee Romero is in management with [employer]. [name] is 71 months old.     ADVANCED DIRECTIVES: not in place. Her husband is automatically her HCPOA.   HEALTH MAINTENANCE: Social History   Tobacco Use  . Smoking status: Former Smoker    Packs/day: 0.50    Years: 10.00    Pack years: 5.00    Types: Cigarettes    Start date: 01/13/2002    Quit date: 10/17/2016    Years since quitting: 2.3  . Smokeless tobacco: Never Used  Substance Use Topics  . Alcohol use: Yes    Comment: Socially  . Drug use: No     Colonoscopy: n/a  PAP: 09/20/2018  Bone density: n/a   Allergies  Allergen Reactions  . Chlorhexidine Gluconate Rash    Current Outpatient Medications  Medication Sig Dispense Refill  .  acetaminophen (TYLENOL) 500 MG tablet Take 1 tablet (500 mg total) by mouth every 6 (six) hours as needed. 60 tablet 0  . amphetamine-dextroamphetamine (ADDERALL XR) 20 MG 24 hr capsule Take 20 mg by mouth daily.    . Ascorbic Acid (VITAMIN C) 1000 MG tablet Take 1,000 mg by mouth daily.    . citalopram (CELEXA) 20 MG tablet Take 20 mg by mouth at bedtime.     . clobetasol cream (TEMOVATE) 1.91 % Apply 1 application topically daily.    Marland Kitchen dexamethasone (DECADRON) 4 MG tablet Take 2 tablets by mouth once a day on the day after chemotherapy and then take 2 tablets two times a day for 2 days. Take with food. 30 tablet 1  . hydrOXYzine (ATARAX/VISTARIL) 10 MG tablet Take 10 mg by mouth at bedtime.     Marland Kitchen ibuprofen (ADVIL) 600 MG tablet Take 600 mg by mouth every 8 (eight) hours as needed.    Marland Kitchen ibuprofen (ADVIL) 800 MG tablet Take 1 tablet (800 mg total) by mouth every 8 (eight) hours as needed. 30 tablet 0  . lidocaine-prilocaine (EMLA) cream Apply to affected area once 30 g 3  . loratadine (CLARITIN) 10 MG tablet Take 1 tablet (10 mg total) by mouth daily. 60 tablet 3  . LORazepam (ATIVAN) 0.5 MG tablet TAKE  1 TABLET (0.5 MG TOTAL) BY MOUTH AT BEDTIME AS NEEDED (NAUSEA OR VOMITING). 30 tablet 0  . Multiple Vitamin (MULTIVITAMIN WITH MINERALS) TABS tablet Take 1 tablet by mouth daily.    . naproxen sodium (ALEVE) 220 MG tablet Take 1 tablet (220 mg total) by mouth 3 (three) times daily with meals. 60 tablet 3  . prochlorperazine (COMPAZINE) 10 MG tablet Take 1 tablet (10 mg total) by mouth every 6 (six) hours as needed (Nausea or vomiting). 30 tablet 1  . Tetrahydrozoline HCl (VISINE OP) Place 1 drop into both eyes daily as needed (redness).    . triamcinolone (KENALOG) 0.025 % ointment Apply 1 application topically 2 (two) times daily. 30 g 0  . vitamin B-12 (CYANOCOBALAMIN) 1000 MCG tablet Take 1,000 mcg by mouth daily.     No current facility-administered medications for this visit.    OBJECTIVE:    There were no vitals filed for this visit. Wt Readings from Last 3 Encounters:  02/28/19 180 lb (81.6 kg)  02/15/19 184 lb 12.8 oz (83.8 kg)  02/02/19 183 lb 1.6 oz (83.1 kg)   There is no height or weight on file to calculate BMI.    ECOG FS:1 - Symptomatic but completely ambulatory GENERAL: Patient is a well appearing female in no acute distress HEENT:  Sclerae anicteric.  Mask in place. Neck is supple.  NODES:  No cervical, supraclavicular, or axillary lymphadenopathy palpated.  BREAST EXAM:  Deferred. LUNGS:  Clear to auscultation bilaterally.  No wheezes or rhonchi. HEART:  Regular rate and rhythm. No murmur appreciated. ABDOMEN:  Soft, nontender.  Positive, normoactive bowel sounds. No organomegaly palpated. MSK:  No focal spinal tenderness to palpation. Full range of motion bilaterally in the upper extremities. EXTREMITIES:  No peripheral edema.   SKIN:  Clear with no obvious rashes or skin changes. No nail dyscrasia. NEURO:  Nonfocal. Well oriented.  Appropriate affect.    LAB RESULTS:  CMP     Component Value Date/Time   NA 141 02/15/2019 0925   K 4.0 02/15/2019 0925   CL 104 02/15/2019 0925   CO2 26 02/15/2019 0925   GLUCOSE 116 (H) 02/15/2019 0925   BUN 12 02/15/2019 0925   CREATININE 0.63 02/15/2019 0925   CREATININE 0.68 10/05/2018 1244   CALCIUM 8.7 (L) 02/15/2019 0925   PROT 6.6 02/15/2019 0925   ALBUMIN 3.9 02/15/2019 0925   AST 22 02/15/2019 0925   AST 15 10/05/2018 1244   ALT 44 02/15/2019 0925   ALT 18 10/05/2018 1244   ALKPHOS 77 02/15/2019 0925   BILITOT <0.2 (L) 02/15/2019 0925   BILITOT 0.5 10/05/2018 1244   GFRNONAA >60 02/15/2019 0925   GFRNONAA >60 10/05/2018 1244   GFRAA >60 02/15/2019 0925   GFRAA >60 10/05/2018 1244    No results found for: TOTALPROTELP, ALBUMINELP, A1GS, A2GS, BETS, BETA2SER, GAMS, MSPIKE, SPEI  No results found for: KPAFRELGTCHN, LAMBDASER, KAPLAMBRATIO  Lab Results  Component Value Date   WBC 13.8 (H)  02/15/2019   NEUTROABS 9.3 (H) 02/15/2019   HGB 11.0 (L) 02/15/2019   HCT 32.7 (L) 02/15/2019   MCV 95.1 02/15/2019   PLT 210 02/15/2019    No results found for: LABCA2  No components found for: XBWIOM355  No results for input(s): INR in the last 168 hours.  No results found for: LABCA2  No results found for: HRC163  No results found for: AGT364  No results found for: WOE321  No results found for: CA2729  No components  found for: HGQUANT  No results found for: CEA1 / No results found for: CEA1   No results found for: AFPTUMOR  No results found for: CHROMOGRNA  No results found for: HGBA, HGBA2QUANT, HGBFQUANT, HGBSQUAN (Hemoglobinopathy evaluation)   No results found for: LDH  No results found for: IRON, TIBC, IRONPCTSAT (Iron and TIBC)  No results found for: FERRITIN  Urinalysis    Component Value Date/Time   BILIRUBINUR Neg 01/18/2012 1458   PROTEINUR Neg 01/18/2012 1458   UROBILINOGEN 0.2 01/18/2012 1458   NITRITE Neg 01/18/2012 1458   LEUKOCYTESUR Negative 01/18/2012 1458     STUDIES: No results found.   ELIGIBLE FOR AVAILABLE RESEARCH PROTOCOL: no  ASSESSMENT: 39 y.o. [place] woman status post right breast upper inner quadrant biopsy on 09/26/2018 for a clinical T2 N0, stage IB invasive ductal carcinoma, grade 2, estrogen and progesterone receptor positive, HER-2 nonamplified, with an MIB-1-1 of 35%  (1) Genetic testing reported on 10/05/2018 through the [facility] Breast Cancer STAT Panel + Common Hereditary cancer panel found no pathogenic mutations. The STAT Breast cancer panel offered by [facility] includes sequencing and rearrangement analysis for the following 9 genes:  ATM, BRCA1, BRCA2, CDH1, CHEK2, PALB2, PTEN, STK11 and TP53. The Common Hereditary Cancers Panel offered by [facility] includes sequencing and/or deletion duplication testing of the following 47 genes: APC, ATM, AXIN2, BARD1, BMPR1A, BRCA1, BRCA2, BRIP1, CDH1, CDKN2A (p14ARF), CDKN2A  (p16INK4a), CKD4, CHEK2, CTNNA1, DICER1, EPCAM (Deletion/duplication testing only), GREM1 (promoter region deletion/duplication testing only), KIT, MEN1, MLH1, MSH2, MSH3, MSH6, MUTYH, NBN, NF1, NHTL1, PALB2, PDGFRA, PMS2, POLD1, POLE, PTEN, RAD50, RAD51C, RAD51D, SDHB, SDHC, SDHD, SMAD4, SMARCA4. STK11, TP53, TSC1, TSC2, and VHL.  The following genes were evaluated for sequence changes only: SDHA and HOXB13 c.251G>A variant only.   (2) MammaPrint obtained from the original breast biopsy returned "high risk" indicating the patient will have an excellent prognosis if she receives adjuvant chemotherapy  (3) status post right lumpectomy and right axillary sentinel lymph node sampling 11/21/2018 for a pT2 pN1, stage IIA invasive ductal carcinoma, grade 2, with negative margins  (a) a total of 4 sentinel lymph nodes removed, 1 with macrometastasis  (b) status post bilateral breast reductions at the time of lumpectomy  (4) adjuvant chemotherapy consisting of cyclophosphamide and doxorubicin in dose dense fashion x4 started 01/05/2019, to be followed by weekly paclitaxel x12  (a) echo 10/27/2018 shows an ejection fraction in the 55-60% range  (5) adjuvant radiation to follow, in [place] per patient's request  (6) tamoxifen started 10/05/2018 in anticipation of surgical delays, discontinued 12/05/2018   PLAN: Kaylee Romero has completed the first part of her adjuvant chemotherapy regimen of Doxorubicin and cyclophsophamide x4.  She will now proceed to weekly Paclitaxel.  She and I reviewed risks/benefits and common side effects such as peripheral neuropathy, achiness after first dose, hypersensitivity risk, mild nausea, liver enzyme elevation, rash.  We reviewed that she will take a compazine at dinner time tonight, and then as needed.  She does not need to take Dexamethasone or claritin any longer.    Marcene Brawn and I discussed cryotherapy.  She plans on starting this.    We also talked about healthy diet, and  exercise.  Hopefully, with Paclitaxel being less intense, most patients feel better and can do more during this regimen.  As the weather warms up, I am hopeful that she will be able to increase her activity level and also be able to play more with her daughter.    We will see  Kaylee Romero back in 1 week for labs, f/u, and her next treatment.  She was recommended to continue with the appropriate pandemic precautions. She knows to call for any questions that may arise between now and her next appointment.  We are happy to see her sooner if needed.  Total encounter time 30 minutes.Wilber Bihari, NP  03/01/19 8:59 PM Medical Oncology and Hematology [facility] [place], [place] [address] Tel. [phone]    Fax. [phone]   *Total Encounter Time as defined by the Centers for Medicare and Medicaid Services includes, in addition to the face-to-face time of a patient visit (documented in the note above) non-face-to-face time: obtaining and reviewing outside history, ordering and reviewing medications, tests or procedures, care coordination (communications with other health care professionals or caregivers) and documentation in the medical record.

## 2019-03-02 ENCOUNTER — Inpatient Hospital Stay: Payer: BC Managed Care – PPO

## 2019-03-02 ENCOUNTER — Other Ambulatory Visit: Payer: Self-pay

## 2019-03-02 ENCOUNTER — Inpatient Hospital Stay (HOSPITAL_BASED_OUTPATIENT_CLINIC_OR_DEPARTMENT_OTHER): Payer: BC Managed Care – PPO | Admitting: Adult Health

## 2019-03-02 ENCOUNTER — Encounter: Payer: Self-pay | Admitting: Adult Health

## 2019-03-02 VITALS — BP 124/81 | HR 88 | Temp 98.2°F | Resp 16

## 2019-03-02 VITALS — BP 107/54 | HR 101 | Temp 98.5°F | Resp 17 | Ht 64.0 in | Wt 184.7 lb

## 2019-03-02 DIAGNOSIS — C50411 Malignant neoplasm of upper-outer quadrant of right female breast: Secondary | ICD-10-CM

## 2019-03-02 DIAGNOSIS — Z170 Estrogen receptor positive status [ER+]: Secondary | ICD-10-CM | POA: Diagnosis not present

## 2019-03-02 DIAGNOSIS — Z95828 Presence of other vascular implants and grafts: Secondary | ICD-10-CM

## 2019-03-02 DIAGNOSIS — Z5111 Encounter for antineoplastic chemotherapy: Secondary | ICD-10-CM | POA: Diagnosis not present

## 2019-03-02 LAB — COMPREHENSIVE METABOLIC PANEL
ALT: 45 U/L — ABNORMAL HIGH (ref 0–44)
AST: 20 U/L (ref 15–41)
Albumin: 3.8 g/dL (ref 3.5–5.0)
Alkaline Phosphatase: 73 U/L (ref 38–126)
Anion gap: 11 (ref 5–15)
BUN: 11 mg/dL (ref 6–20)
CO2: 26 mmol/L (ref 22–32)
Calcium: 8.8 mg/dL — ABNORMAL LOW (ref 8.9–10.3)
Chloride: 105 mmol/L (ref 98–111)
Creatinine, Ser: 0.66 mg/dL (ref 0.44–1.00)
GFR calc Af Amer: >60 mL/min (ref >60)
GFR calc non Af Amer: >60 mL/min (ref >60)
Glucose, Bld: 107 mg/dL — ABNORMAL HIGH (ref 70–99)
Potassium: 4 mmol/L (ref 3.5–5.1)
Sodium: 142 mmol/L (ref 135–145)
Total Bilirubin: 0.3 mg/dL (ref 0.3–1.2)
Total Protein: 6.5 g/dL (ref 6.5–8.1)

## 2019-03-02 LAB — CBC WITH DIFFERENTIAL/PLATELET
Abs Immature Granulocytes: 0.63 10*3/uL — ABNORMAL HIGH (ref 0.00–0.07)
Basophils Absolute: 0.1 10*3/uL (ref 0.0–0.1)
Basophils Relative: 1 %
Eosinophils Absolute: 0 10*3/uL (ref 0.0–0.5)
Eosinophils Relative: 0 %
HCT: 32.1 % — ABNORMAL LOW (ref 36.0–46.0)
Hemoglobin: 10.9 g/dL — ABNORMAL LOW (ref 12.0–15.0)
Immature Granulocytes: 7 %
Lymphocytes Relative: 14 %
Lymphs Abs: 1.2 10*3/uL (ref 0.7–4.0)
MCH: 33 pg (ref 26.0–34.0)
MCHC: 34 g/dL (ref 30.0–36.0)
MCV: 97.3 fL (ref 80.0–100.0)
Monocytes Absolute: 0.6 10*3/uL (ref 0.1–1.0)
Monocytes Relative: 7 %
Neutro Abs: 6.5 10*3/uL (ref 1.7–7.7)
Neutrophils Relative %: 71 %
Platelets: 230 10*3/uL (ref 150–400)
RBC: 3.3 MIL/uL — ABNORMAL LOW (ref 3.87–5.11)
RDW: 17.6 % — ABNORMAL HIGH (ref 11.5–15.5)
WBC: 9.1 10*3/uL (ref 4.0–10.5)
nRBC: 0 % (ref 0.0–0.2)

## 2019-03-02 MED ORDER — SODIUM CHLORIDE 0.9 % IV SOLN
Freq: Once | INTRAVENOUS | Status: AC
  Filled 2019-03-02: qty 250

## 2019-03-02 MED ORDER — SODIUM CHLORIDE 0.9% FLUSH
10.0000 mL | INTRAVENOUS | Status: DC | PRN
  Administered 2019-03-02: 10 mL
  Filled 2019-03-02: qty 10

## 2019-03-02 MED ORDER — DIPHENHYDRAMINE HCL 50 MG/ML IJ SOLN
25.0000 mg | Freq: Once | INTRAMUSCULAR | Status: AC
  Administered 2019-03-02: 25 mg via INTRAVENOUS

## 2019-03-02 MED ORDER — SODIUM CHLORIDE 0.9 % IV SOLN
80.0000 mg/m2 | Freq: Once | INTRAVENOUS | Status: AC
  Administered 2019-03-02: 150 mg via INTRAVENOUS
  Filled 2019-03-02: qty 25

## 2019-03-02 MED ORDER — SODIUM CHLORIDE 0.9 % IV SOLN
20.0000 mg | Freq: Once | INTRAVENOUS | Status: AC
  Administered 2019-03-02: 20 mg via INTRAVENOUS
  Filled 2019-03-02: qty 20

## 2019-03-02 MED ORDER — SODIUM CHLORIDE 0.9% FLUSH
10.0000 mL | Freq: Once | INTRAVENOUS | Status: AC
  Administered 2019-03-02: 10 mL
  Filled 2019-03-02: qty 10

## 2019-03-02 MED ORDER — HEPARIN SOD (PORK) LOCK FLUSH 100 UNIT/ML IV SOLN
500.0000 [IU] | Freq: Once | INTRAVENOUS | Status: AC | PRN
  Administered 2019-03-02: 500 [IU]
  Filled 2019-03-02: qty 5

## 2019-03-02 MED ORDER — FAMOTIDINE IN NACL 20-0.9 MG/50ML-% IV SOLN
INTRAVENOUS | Status: AC
  Filled 2019-03-02: qty 50

## 2019-03-02 MED ORDER — DIPHENHYDRAMINE HCL 50 MG/ML IJ SOLN
INTRAMUSCULAR | Status: AC
  Filled 2019-03-02: qty 1

## 2019-03-02 MED ORDER — FAMOTIDINE IN NACL 20-0.9 MG/50ML-% IV SOLN
20.0000 mg | Freq: Once | INTRAVENOUS | Status: AC
  Administered 2019-03-02: 20 mg via INTRAVENOUS

## 2019-03-02 NOTE — Progress Notes (Signed)
Per Wilber Bihari NP, patient is ok to receive COVID vaccine. Patient aware.

## 2019-03-02 NOTE — Patient Instructions (Addendum)
[facility] Discharge Instructions for Patients Receiving Chemotherapy  Today you received the following chemotherapy agents: paclitaxel.  To help prevent nausea and vomiting after your treatment, we encourage you to take your nausea medication as directed.   If you develop nausea and vomiting that is not controlled by your nausea medication, call the clinic.   BELOW ARE SYMPTOMS THAT SHOULD BE REPORTED IMMEDIATELY:  *FEVER GREATER THAN 100.5 F  *CHILLS WITH OR WITHOUT FEVER  NAUSEA AND VOMITING THAT IS NOT CONTROLLED WITH YOUR NAUSEA MEDICATION  *UNUSUAL SHORTNESS OF BREATH  *UNUSUAL BRUISING OR BLEEDING  TENDERNESS IN MOUTH AND THROAT WITH OR WITHOUT PRESENCE OF ULCERS  *URINARY PROBLEMS  *BOWEL PROBLEMS  UNUSUAL RASH Items with * indicate a potential emergency and should be followed up as soon as possible.  Feel free to call the clinic should you have any questions or concerns. The clinic phone number is [phone].  Please show the CHEMO ALERT CARD at check-in to the Emergency Department and triage nurse.  Paclitaxel injection What is this medicine? PACLITAXEL (PAK li TAX el) is a chemotherapy drug. It targets fast dividing cells, like cancer cells, and causes these cells to die. This medicine is used to treat ovarian cancer, breast cancer, lung cancer, Kaposi's sarcoma, and other cancers. This medicine may be used for other purposes; ask your health care provider or pharmacist if you have questions. COMMON BRAND NAME(S): Onxol, Taxol What should I tell my health care provider before I take this medicine? They need to know if you have any of these conditions:  history of irregular heartbeat  liver disease  low blood counts, like low white cell, platelet, or red cell counts  lung or breathing disease, like asthma  tingling of the fingers or toes, or other nerve disorder  an unusual or allergic reaction to paclitaxel, alcohol, polyoxyethylated  castor oil, other chemotherapy, other medicines, foods, dyes, or preservatives  pregnant or trying to get pregnant  breast-feeding How should I use this medicine? This drug is given as an infusion into a vein. It is administered in a hospital or clinic by a specially trained health care professional. Talk to your pediatrician regarding the use of this medicine in children. Special care may be needed. Overdosage: If you think you have taken too much of this medicine contact a poison control center or emergency room at once. NOTE: This medicine is only for you. Do not share this medicine with others. What if I miss a dose? It is important not to miss your dose. Call your doctor or health care professional if you are unable to keep an appointment. What may interact with this medicine? Do not take this medicine with any of the following medications:  disulfiram  metronidazole This medicine may also interact with the following medications:  antiviral medicines for hepatitis, HIV or AIDS  certain antibiotics like erythromycin and clarithromycin  certain medicines for fungal infections like ketoconazole and itraconazole  certain medicines for seizures like carbamazepine, phenobarbital, phenytoin  gemfibrozil  nefazodone  rifampin  St. John's wort This list may not describe all possible interactions. Give your health care provider a list of all the medicines, herbs, non-prescription drugs, or dietary supplements you use. Also tell them if you smoke, drink alcohol, or use illegal drugs. Some items may interact with your medicine. What should I watch for while using this medicine? Your condition will be monitored carefully while you are receiving this medicine. You will need important blood work   done while you are taking this medicine. This medicine can cause serious allergic reactions. To reduce your risk you will need to take other medicine(s) before treatment with this medicine. If you  experience allergic reactions like skin rash, itching or hives, swelling of the face, lips, or tongue, tell your doctor or health care professional right away. In some cases, you may be given additional medicines to help with side effects. Follow all directions for their use. This drug may make you feel generally unwell. This is not uncommon, as chemotherapy can affect healthy cells as well as cancer cells. Report any side effects. Continue your course of treatment even though you feel ill unless your doctor tells you to stop. Call your doctor or health care professional for advice if you get a fever, chills or sore throat, or other symptoms of a cold or flu. Do not treat yourself. This drug decreases your body's ability to fight infections. Try to avoid being around people who are sick. This medicine may increase your risk to bruise or bleed. Call your doctor or health care professional if you notice any unusual bleeding. Be careful brushing and flossing your teeth or using a toothpick because you may get an infection or bleed more easily. If you have any dental work done, tell your dentist you are receiving this medicine. Avoid taking products that contain aspirin, acetaminophen, ibuprofen, naproxen, or ketoprofen unless instructed by your doctor. These medicines may hide a fever. Do not become pregnant while taking this medicine. Women should inform their doctor if they wish to become pregnant or think they might be pregnant. There is a potential for serious side effects to an unborn child. Talk to your health care professional or pharmacist for more information. Do not breast-feed an infant while taking this medicine. Men are advised not to father a child while receiving this medicine. This product may contain alcohol. Ask your pharmacist or healthcare provider if this medicine contains alcohol. Be sure to tell all healthcare providers you are taking this medicine. Certain medicines, like metronidazole  and disulfiram, can cause an unpleasant reaction when taken with alcohol. The reaction includes flushing, headache, nausea, vomiting, sweating, and increased thirst. The reaction can last from 30 minutes to several hours. What side effects may I notice from receiving this medicine? Side effects that you should report to your doctor or health care professional as soon as possible:  allergic reactions like skin rash, itching or hives, swelling of the face, lips, or tongue  breathing problems  changes in vision  fast, irregular heartbeat  high or low blood pressure  mouth sores  pain, tingling, numbness in the hands or feet  signs of decreased platelets or bleeding - bruising, pinpoint red spots on the skin, black, tarry stools, blood in the urine  signs of decreased red blood cells - unusually weak or tired, feeling faint or lightheaded, falls  signs of infection - fever or chills, cough, sore throat, pain or difficulty passing urine  signs and symptoms of liver injury like dark yellow or brown urine; general ill feeling or flu-like symptoms; light-colored stools; loss of appetite; nausea; right upper belly pain; unusually weak or tired; yellowing of the eyes or skin  swelling of the ankles, feet, hands  unusually slow heartbeat Side effects that usually do not require medical attention (report to your doctor or health care professional if they continue or are bothersome):  diarrhea  hair loss  loss of appetite  muscle or joint pain    nausea, vomiting  pain, redness, or irritation at site where injected  tiredness This list may not describe all possible side effects. Call your doctor for medical advice about side effects. You may report side effects to FDA at 1-800-FDA-1088. Where should I keep my medicine? This drug is given in a hospital or clinic and will not be stored at home. NOTE: This sheet is a summary. It may not cover all possible information. If you have  questions about this medicine, talk to your doctor, pharmacist, or health care provider.  2020 [employer] Standard (2016-08-25 13:14:55)  Paclitaxel injection What is this medicine? PACLITAXEL (PAK li TAX el) is a chemotherapy drug. It targets fast dividing cells, like cancer cells, and causes these cells to die. This medicine is used to treat ovarian cancer, breast cancer, lung cancer, Kaposi's sarcoma, and other cancers. This medicine may be used for other purposes; ask your health care provider or pharmacist if you have questions. COMMON BRAND NAME(S): Onxol, Taxol What should I tell my health care provider before I take this medicine? They need to know if you have any of these conditions:  history of irregular heartbeat  liver disease  low blood counts, like low white cell, platelet, or red cell counts  lung or breathing disease, like asthma  tingling of the fingers or toes, or other nerve disorder  an unusual or allergic reaction to paclitaxel, alcohol, polyoxyethylated castor oil, other chemotherapy, other medicines, foods, dyes, or preservatives  pregnant or trying to get pregnant  breast-feeding How should I use this medicine? This drug is given as an infusion into a vein. It is administered in a hospital or clinic by a specially trained health care professional. Talk to your pediatrician regarding the use of this medicine in children. Special care may be needed. Overdosage: If you think you have taken too much of this medicine contact a poison control center or emergency room at once. NOTE: This medicine is only for you. Do not share this medicine with others. What if I miss a dose? It is important not to miss your dose. Call your doctor or health care professional if you are unable to keep an appointment. What may interact with this medicine? Do not take this medicine with any of the following medications:  disulfiram  metronidazole This medicine may also interact  with the following medications:  antiviral medicines for hepatitis, HIV or AIDS  certain antibiotics like erythromycin and clarithromycin  certain medicines for fungal infections like ketoconazole and itraconazole  certain medicines for seizures like carbamazepine, phenobarbital, phenytoin  gemfibrozil  nefazodone  rifampin  St. John's wort This list may not describe all possible interactions. Give your health care provider a list of all the medicines, herbs, non-prescription drugs, or dietary supplements you use. Also tell them if you smoke, drink alcohol, or use illegal drugs. Some items may interact with your medicine. What should I watch for while using this medicine? Your condition will be monitored carefully while you are receiving this medicine. You will need important blood work done while you are taking this medicine. This medicine can cause serious allergic reactions. To reduce your risk you will need to take other medicine(s) before treatment with this medicine. If you experience allergic reactions like skin rash, itching or hives, swelling of the face, lips, or tongue, tell your doctor or health care professional right away. In some cases, you may be given additional medicines to help with side effects. Follow all directions for their use. This  drug may make you feel generally unwell. This is not uncommon, as chemotherapy can affect healthy cells as well as cancer cells. Report any side effects. Continue your course of treatment even though you feel ill unless your doctor tells you to stop. Call your doctor or health care professional for advice if you get a fever, chills or sore throat, or other symptoms of a cold or flu. Do not treat yourself. This drug decreases your body's ability to fight infections. Try to avoid being around people who are sick. This medicine may increase your risk to bruise or bleed. Call your doctor or health care professional if you notice any unusual  bleeding. Be careful brushing and flossing your teeth or using a toothpick because you may get an infection or bleed more easily. If you have any dental work done, tell your dentist you are receiving this medicine. Avoid taking products that contain aspirin, acetaminophen, ibuprofen, naproxen, or ketoprofen unless instructed by your doctor. These medicines may hide a fever. Do not become pregnant while taking this medicine. Women should inform their doctor if they wish to become pregnant or think they might be pregnant. There is a potential for serious side effects to an unborn child. Talk to your health care professional or pharmacist for more information. Do not breast-feed an infant while taking this medicine. Men are advised not to father a child while receiving this medicine. This product may contain alcohol. Ask your pharmacist or healthcare provider if this medicine contains alcohol. Be sure to tell all healthcare providers you are taking this medicine. Certain medicines, like metronidazole and disulfiram, can cause an unpleasant reaction when taken with alcohol. The reaction includes flushing, headache, nausea, vomiting, sweating, and increased thirst. The reaction can last from 30 minutes to several hours. What side effects may I notice from receiving this medicine? Side effects that you should report to your doctor or health care professional as soon as possible:  allergic reactions like skin rash, itching or hives, swelling of the face, lips, or tongue  breathing problems  changes in vision  fast, irregular heartbeat  high or low blood pressure  mouth sores  pain, tingling, numbness in the hands or feet  signs of decreased platelets or bleeding - bruising, pinpoint red spots on the skin, black, tarry stools, blood in the urine  signs of decreased red blood cells - unusually weak or tired, feeling faint or lightheaded, falls  signs of infection - fever or chills, cough, sore  throat, pain or difficulty passing urine  signs and symptoms of liver injury like dark yellow or brown urine; general ill feeling or flu-like symptoms; light-colored stools; loss of appetite; nausea; right upper belly pain; unusually weak or tired; yellowing of the eyes or skin  swelling of the ankles, feet, hands  unusually slow heartbeat Side effects that usually do not require medical attention (report to your doctor or health care professional if they continue or are bothersome):  diarrhea  hair loss  loss of appetite  muscle or joint pain  nausea, vomiting  pain, redness, or irritation at site where injected  tiredness This list may not describe all possible side effects. Call your doctor for medical advice about side effects. You may report side effects to FDA at 1-800-FDA-1088. Where should I keep my medicine? This drug is given in a hospital or clinic and will not be stored at home. NOTE: This sheet is a summary. It may not cover all possible information. If you have  questions about this medicine, talk to your doctor, pharmacist, or health care provider.  2020 [employer] Standard (2016-08-25 13:14:55)

## 2019-03-08 NOTE — Progress Notes (Signed)
[place]  [phone] [phone]     ID: JESSYKA AUSTRIA DOB: October 31, 1980  MR#: [id number]  IOX#:[id number]  Patient Care Team: Linda Hedges, DO as PCP - General (Obstetrics and Gynecology) Mauro Kaufmann, RN as Oncology Nurse Navigator Rockwell Germany, RN as Oncology Nurse Navigator Gery Pray, MD as Consulting Physician (Radiation Oncology) Adreanna Fickel, Virgie Dad, MD as Consulting Physician (Oncology) Jovita Kussmaul, MD as Consulting Physician (General Surgery) Dillingham, Loel Lofty, DO as Attending Physician (Plastic Surgery) Chauncey Cruel, MD OTHER MD:  CHIEF COMPLAINT: estrogen receptor positive breast cancer  CURRENT TREATMENT: Adjuvant chemotherapy   INTERVAL HISTORY: Kaylee Romero  returns today for follow-up and treatment of her estrogen receptor positive breast cancer.   She is now receiving weekly Paclitaxel.  Today is her second week.  She finds this much easier than the earlier treatments.  She had essentially no nausea.  She only took Compazine on the evening of day 1.  She also has more energy.  Her most recent echocardiogram on 10/27/2018 showed an ejection fraction of 55-60%.   REVIEW OF SYSTEMS: Talena tells me she does not have taste progression and is eating normally again, now that she is no longer on dexamethasone.  She continues to work full-time from home.  She expects to be offered the COVID-19 vaccine soon.  Her husband of course is in management so he will have to wait for his shot.  She is doing some walking for exercise.  She has had no periods and is not having hot flashes at present.  Recall that she has a Mirena in place.  A detailed review of systems today was otherwise stable    HISTORY OF CURRENT ILLNESS: From the original intake note:  Kaylee Romero (pronounced "Bram-Hall") found a palpable lump in the 12 o'clock position of the right breast, which he brought to the attention of her gynecologist.. She underwent  bilateral diagnostic mammography with tomography and bilateral breast ultrasonography at [facility] on 09/23/2018 showing: breast density category C; a spiculated mass with associated with architectural distortion and pleomorphic calcifications lying in the 11-12 o'clock position, middle depth, measuring 2 cm in size, corresponding to the palpable abnormality.   Physical exam confirmed a firm but mobile mass in the 12 o'clock position of the right breast.  By ultrasound there was a highly suspicious 2.1 cm mass in the 12 o'clock position of the right breast; no other evidence of breast malignancy or lymphadenopathy bilaterally.Sonographic evaluation of the right axilla found no enlarged or abnormal appearing lymph node  In the left axilla there were several lymph nodes with areas of cortical prominence similar to the lymph node noted on the right.  Given the symmetry all these are presumed to be reactive  Accordingly on 09/26/2018 she proceeded to biopsy of the right breast area in question. The pathology from this procedure ([id number]) showed: invasive ductal carcinoma, grade 2; prognostic indicators significant for: estrogen receptor, 80% positive with moderate staining intensity and progesterone receptor, 90% positive with strong staining intensity. Proliferation marker Ki67 at 35%. HER2 negative by immunohistochemistry (1+).  The patient's subsequent history is as detailed below.   PAST MEDICAL HISTORY: Past Medical History:  Diagnosis Date  . Anxiety   . Family history of breast cancer   . HSV (herpes simplex virus) anogenital infection    positive titer only    PAST SURGICAL HISTORY: Past Surgical History:  Procedure Laterality Date  . BREAST LUMPECTOMY WITH AXILLARY  LYMPH NODE BIOPSY Right 11/21/2018   Procedure: RIGHT BREAST LUMPECTOMY WITH SENTINEL LYMPH NODE BIOPSY;  Surgeon: Jovita Kussmaul, MD;  Location: [facility];  Service: General;  Laterality: Right;  . BREAST REDUCTION  SURGERY Bilateral 11/21/2018   Procedure: BILATERAL MAMMARY REDUCTION  (BREAST);  Surgeon: Wallace Going, DO;  Location: [facility];  Service: Plastics;  Laterality: Bilateral;  . PORTACATH PLACEMENT N/A 11/21/2018   Procedure: INSERTION LEFT PORT-A-CATH WITH ULTRASOUND GUIDANCE;  Surgeon: Jovita Kussmaul, MD;  Location: [facility];  Service: General;  Laterality: N/A;  . TONSILLECTOMY  1987  . TONSILLECTOMY    . WISDOM TOOTH EXTRACTION      FAMILY HISTORY: Family History  Problem Relation Age of Onset  . Hypertension Father   . Alcohol abuse Paternal Grandfather   . Heart disease Paternal Grandfather   . Alcohol abuse Maternal Grandfather   . Heart disease Maternal Grandmother   . Breast cancer Other    Patient's parents are both living at age 28, as of 09/2018. Her father lives in [place], and her mother lives in [place], [place]. The patient denies a family hx of ovarian cancer. A maternal great aunt was diagnosed with breast cancer twice. She has 2 brothers, no sisters   GYNECOLOGIC HISTORY:  No LMP recorded. (Menstrual status: Chemotherapy). Menarche: 39 years old Age at first live birth: 39 years old [place] P 1 Currently having regular periods  Contraceptive: Mirena IUD in place HRT n/a  Hysterectomy? no BSO? no   SOCIAL HISTORY: (updated January 2021) Kaylee Romero is a [employer]. Husband Tommi Rumps is in management with [employer]. [name] is 57 months old.     ADVANCED DIRECTIVES: not in place. Her husband is automatically her HCPOA.   HEALTH MAINTENANCE: Social History   Tobacco Use  . Smoking status: Former Smoker    Packs/day: 0.50    Years: 10.00    Pack years: 5.00    Types: Cigarettes    Start date: 01/13/2002    Quit date: 10/17/2016    Years since quitting: 2.3  . Smokeless tobacco: Never Used  Substance Use Topics  . Alcohol use: Yes    Comment: Socially  . Drug use: No     Colonoscopy: n/a  PAP: 09/20/2018  Bone density: n/a   Allergies   Allergen Reactions  . Chlorhexidine Gluconate Rash    Current Outpatient Medications  Medication Sig Dispense Refill  . acetaminophen (TYLENOL) 500 MG tablet Take 1 tablet (500 mg total) by mouth every 6 (six) hours as needed. 60 tablet 0  . amphetamine-dextroamphetamine (ADDERALL XR) 20 MG 24 hr capsule Take 20 mg by mouth daily.    . Ascorbic Acid (VITAMIN C) 1000 MG tablet Take 1,000 mg by mouth daily.    . citalopram (CELEXA) 20 MG tablet Take 20 mg by mouth at bedtime.     . clobetasol cream (TEMOVATE) 3.23 % Apply 1 application topically daily.    . hydrOXYzine (ATARAX/VISTARIL) 10 MG tablet Take 10 mg by mouth at bedtime.     . lidocaine-prilocaine (EMLA) cream Apply to affected area once 30 g 3  . LORazepam (ATIVAN) 0.5 MG tablet TAKE 1 TABLET (0.5 MG TOTAL) BY MOUTH AT BEDTIME AS NEEDED (NAUSEA OR VOMITING). 30 tablet 0  . Multiple Vitamin (MULTIVITAMIN WITH MINERALS) TABS tablet Take 1 tablet by mouth daily.    . naproxen sodium (ALEVE) 220 MG tablet Take 1 tablet (220 mg total) by mouth 3 (three) times daily with meals. [place]  tablet 3  . prochlorperazine (COMPAZINE) 10 MG tablet Take 1 tablet (10 mg total) by mouth every 6 (six) hours as needed (Nausea or vomiting). 30 tablet 1  . Tetrahydrozoline HCl (VISINE OP) Place 1 drop into both eyes daily as needed (redness).    . triamcinolone (KENALOG) 0.025 % ointment Apply 1 application topically 2 (two) times daily. 30 g 0  . vitamin B-12 (CYANOCOBALAMIN) 1000 MCG tablet Take 1,000 mcg by mouth daily.     No current facility-administered medications for this visit.    OBJECTIVE: Young white woman who appears younger than stated age  56:   03/09/19 0933  BP: 113/69  Pulse: 99  Resp: 18  Temp: 98.3 F (36.8 C)  SpO2: 100%   Wt Readings from Last 3 Encounters:  03/09/19 184 lb 1.6 oz (83.5 kg)  03/02/19 184 lb 11.2 oz (83.8 kg)  02/28/19 180 lb (81.6 kg)   Body mass index is 31.6 kg/m.    ECOG FS:1 - Symptomatic but  completely ambulatory  Sclerae unicteric, EOMs intact Wearing a mask No cervical or supraclavicular adenopathy Lungs no rales or rhonchi Heart regular rate and rhythm Abd soft, nontender, positive bowel sounds MSK no focal spinal tenderness, no upper extremity lymphedema Neuro: nonfocal, well oriented, appropriate affect Breasts: Deferred   LAB RESULTS:  CMP     Component Value Date/Time   NA 142 03/02/2019 0952   K 4.0 03/02/2019 0952   CL 105 03/02/2019 0952   CO2 26 03/02/2019 0952   GLUCOSE 107 (H) 03/02/2019 0952   BUN 11 03/02/2019 0952   CREATININE 0.66 03/02/2019 0952   CREATININE 0.68 10/05/2018 1244   CALCIUM 8.8 (L) 03/02/2019 0952   PROT 6.5 03/02/2019 0952   ALBUMIN 3.8 03/02/2019 0952   AST 20 03/02/2019 0952   AST 15 10/05/2018 1244   ALT 45 (H) 03/02/2019 0952   ALT 18 10/05/2018 1244   ALKPHOS 73 03/02/2019 0952   BILITOT 0.3 03/02/2019 0952   BILITOT 0.5 10/05/2018 1244   GFRNONAA >60 03/02/2019 0952   GFRNONAA >60 10/05/2018 1244   GFRAA >60 03/02/2019 0952   GFRAA >60 10/05/2018 1244    No results found for: TOTALPROTELP, ALBUMINELP, A1GS, A2GS, BETS, BETA2SER, GAMS, MSPIKE, SPEI  No results found for: KPAFRELGTCHN, LAMBDASER, KAPLAMBRATIO  Lab Results  Component Value Date   WBC 5.1 03/09/2019   NEUTROABS 3.5 03/09/2019   HGB 11.0 (L) 03/09/2019   HCT 32.2 (L) 03/09/2019   MCV 97.9 03/09/2019   PLT 389 03/09/2019    No results found for: LABCA2  No components found for: [id number]  No results for input(s): INR in the last 168 hours.  No results found for: LABCA2  No results found for: ZGY174  No results found for: BSW967  No results found for: RFF638  No results found for: CA2729  No components found for: HGQUANT  No results found for: CEA1 / No results found for: CEA1   No results found for: AFPTUMOR  No results found for: CHROMOGRNA  No results found for: HGBA, HGBA2QUANT, HGBFQUANT, HGBSQUAN (Hemoglobinopathy  evaluation)   No results found for: LDH  No results found for: IRON, TIBC, IRONPCTSAT (Iron and TIBC)  No results found for: FERRITIN  Urinalysis    Component Value Date/Time   BILIRUBINUR Neg 01/18/2012 1458   PROTEINUR Neg 01/18/2012 1458   UROBILINOGEN 0.2 01/18/2012 1458   NITRITE Neg 01/18/2012 1458   LEUKOCYTESUR Negative 01/18/2012 1458     STUDIES: No results  found.   ELIGIBLE FOR AVAILABLE RESEARCH PROTOCOL: no  ASSESSMENT: 39 y.o. [place] woman status post right breast upper inner quadrant biopsy on 09/26/2018 for a clinical T2 N0, stage IB invasive ductal carcinoma, grade 2, estrogen and progesterone receptor positive, HER-2 nonamplified, with an MIB-1-1 of 35%  (1) Genetic testing reported on 10/05/2018 through the [facility] Breast Cancer STAT Panel + Common Hereditary cancer panel found no pathogenic mutations. The STAT Breast cancer panel offered by [facility] includes sequencing and rearrangement analysis for the following 9 genes:  ATM, BRCA1, BRCA2, CDH1, CHEK2, PALB2, PTEN, STK11 and TP53. The Common Hereditary Cancers Panel offered by [facility] includes sequencing and/or deletion duplication testing of the following 47 genes: APC, ATM, AXIN2, BARD1, BMPR1A, BRCA1, BRCA2, BRIP1, CDH1, CDKN2A (p14ARF), CDKN2A (p16INK4a), CKD4, CHEK2, CTNNA1, DICER1, EPCAM (Deletion/duplication testing only), GREM1 (promoter region deletion/duplication testing only), KIT, MEN1, MLH1, MSH2, MSH3, MSH6, MUTYH, NBN, NF1, NHTL1, PALB2, PDGFRA, PMS2, POLD1, POLE, PTEN, RAD50, RAD51C, RAD51D, SDHB, SDHC, SDHD, SMAD4, SMARCA4. STK11, TP53, TSC1, TSC2, and VHL.  The following genes were evaluated for sequence changes only: SDHA and HOXB13 c.251G>A variant only.   (2) MammaPrint obtained from the original breast biopsy returned "high risk" indicating the patient will have an excellent prognosis if she receives adjuvant chemotherapy  (3) status post right lumpectomy and right axillary sentinel lymph  node sampling 11/21/2018 for a pT2 pN1, stage IIA invasive ductal carcinoma, grade 2, with negative margins  (a) a total of 4 sentinel lymph nodes removed, 1 with macrometastasis  (b) status post bilateral breast reductions at the time of lumpectomy  (4) adjuvant chemotherapy consisting of cyclophosphamide and doxorubicin in dose dense fashion x4 started 01/05/2019, completed 02/15/2019, followed by weekly paclitaxel x12 started 03/02/2019  (a) echo 10/27/2018 shows an ejection fraction in the 55-60% range  (5) adjuvant radiation to follow, in [place] per patient's request  (6) tamoxifen started 10/05/2018 in anticipation of surgical delays, discontinued 12/05/2018   PLAN: Viann did remarkably well with her first dose of paclitaxel.  She is receiving her second dose today.  I am dropping the dexamethasone to 10 mg and then 4 mg with subsequent doses.  That in itself should help as well  We again reviewed issues relating to peripheral neuropathy.  She understands this does not have to occur but we would like to interrupt it at the earliest point if it does occur so it does not become a long-term issue.  She likely will receive her Covid vaccine in the next few weeks.  Ideally she would have an 8-10 day without chemo no after the vaccine so that she has time to develop antibodies.  We elected that once she gets her first date she will call us and I will arrange her chemotherapy around that  They have a trip planned the third week in July and hopefully she can be done with radiation before that date.  She knows to call for any other issues that may develop before the next visit.  Total encounter time 25 minutes.Sarajane Jews C. Calvin Chura, MD 03/09/19 9:50 AM Medical Oncology and Hematology [facility] [place], [place] [address] Tel. [phone]    Fax. [phone]   I, Wilburn Mylar, am acting as scribe for Dr. Virgie Dad. Renae Mottley.  I, Lurline Del MD,  have reviewed the above documentation for accuracy and completeness, and I agree with the above.    *Total Encounter Time as defined by the Centers for Medicare and Medicaid  Services includes, in addition to the face-to-face time of a patient visit (documented in the note above) non-face-to-face time: obtaining and reviewing outside history, ordering and reviewing medications, tests or procedures, care coordination (communications with other health care professionals or caregivers) and documentation in the medical record.

## 2019-03-09 ENCOUNTER — Inpatient Hospital Stay: Payer: BC Managed Care – PPO | Attending: Oncology

## 2019-03-09 ENCOUNTER — Inpatient Hospital Stay: Payer: BC Managed Care – PPO

## 2019-03-09 ENCOUNTER — Inpatient Hospital Stay (HOSPITAL_BASED_OUTPATIENT_CLINIC_OR_DEPARTMENT_OTHER): Payer: BC Managed Care – PPO | Admitting: Oncology

## 2019-03-09 ENCOUNTER — Encounter: Payer: Self-pay | Admitting: *Deleted

## 2019-03-09 ENCOUNTER — Other Ambulatory Visit: Payer: Self-pay

## 2019-03-09 VITALS — BP 113/69 | HR 99 | Temp 98.3°F | Resp 18 | Ht 64.0 in | Wt 184.1 lb

## 2019-03-09 DIAGNOSIS — C50411 Malignant neoplasm of upper-outer quadrant of right female breast: Secondary | ICD-10-CM | POA: Diagnosis not present

## 2019-03-09 DIAGNOSIS — C50811 Malignant neoplasm of overlapping sites of right female breast: Secondary | ICD-10-CM | POA: Insufficient documentation

## 2019-03-09 DIAGNOSIS — Z95828 Presence of other vascular implants and grafts: Secondary | ICD-10-CM

## 2019-03-09 DIAGNOSIS — Z170 Estrogen receptor positive status [ER+]: Secondary | ICD-10-CM

## 2019-03-09 DIAGNOSIS — Z5111 Encounter for antineoplastic chemotherapy: Secondary | ICD-10-CM | POA: Insufficient documentation

## 2019-03-09 LAB — CBC WITH DIFFERENTIAL/PLATELET
Abs Immature Granulocytes: 0.04 10*3/uL (ref 0.00–0.07)
Basophils Absolute: 0.1 10*3/uL (ref 0.0–0.1)
Basophils Relative: 1 %
Eosinophils Absolute: 0.1 10*3/uL (ref 0.0–0.5)
Eosinophils Relative: 2 %
HCT: 32.2 % — ABNORMAL LOW (ref 36.0–46.0)
Hemoglobin: 11 g/dL — ABNORMAL LOW (ref 12.0–15.0)
Immature Granulocytes: 1 %
Lymphocytes Relative: 19 %
Lymphs Abs: 1 10*3/uL (ref 0.7–4.0)
MCH: 33.4 pg (ref 26.0–34.0)
MCHC: 34.2 g/dL (ref 30.0–36.0)
MCV: 97.9 fL (ref 80.0–100.0)
Monocytes Absolute: 0.5 10*3/uL (ref 0.1–1.0)
Monocytes Relative: 10 %
Neutro Abs: 3.5 10*3/uL (ref 1.7–7.7)
Neutrophils Relative %: 67 %
Platelets: 389 10*3/uL (ref 150–400)
RBC: 3.29 MIL/uL — ABNORMAL LOW (ref 3.87–5.11)
RDW: 17.3 % — ABNORMAL HIGH (ref 11.5–15.5)
WBC: 5.1 10*3/uL (ref 4.0–10.5)
nRBC: 0 % (ref 0.0–0.2)

## 2019-03-09 LAB — COMPREHENSIVE METABOLIC PANEL
ALT: 66 U/L — ABNORMAL HIGH (ref 0–44)
AST: 29 U/L (ref 15–41)
Albumin: 4 g/dL (ref 3.5–5.0)
Alkaline Phosphatase: 56 U/L (ref 38–126)
Anion gap: 10 (ref 5–15)
BUN: 13 mg/dL (ref 6–20)
CO2: 25 mmol/L (ref 22–32)
Calcium: 8.9 mg/dL (ref 8.9–10.3)
Chloride: 104 mmol/L (ref 98–111)
Creatinine, Ser: 0.66 mg/dL (ref 0.44–1.00)
GFR calc Af Amer: >60 mL/min (ref >60)
GFR calc non Af Amer: >60 mL/min (ref >60)
Glucose, Bld: 135 mg/dL — ABNORMAL HIGH (ref 70–99)
Potassium: 4 mmol/L (ref 3.5–5.1)
Sodium: 139 mmol/L (ref 135–145)
Total Bilirubin: 0.4 mg/dL (ref 0.3–1.2)
Total Protein: 6.8 g/dL (ref 6.5–8.1)

## 2019-03-09 MED ORDER — FAMOTIDINE IN NACL 20-0.9 MG/50ML-% IV SOLN
INTRAVENOUS | Status: AC
  Filled 2019-03-09: qty 50

## 2019-03-09 MED ORDER — DIPHENHYDRAMINE HCL 50 MG/ML IJ SOLN
25.0000 mg | Freq: Once | INTRAMUSCULAR | Status: AC
  Administered 2019-03-09: 10:00:00 25 mg via INTRAVENOUS

## 2019-03-09 MED ORDER — DIPHENHYDRAMINE HCL 50 MG/ML IJ SOLN
INTRAMUSCULAR | Status: AC
  Filled 2019-03-09: qty 1

## 2019-03-09 MED ORDER — SODIUM CHLORIDE 0.9 % IV SOLN
Freq: Once | INTRAVENOUS | Status: AC
  Filled 2019-03-09: qty 250

## 2019-03-09 MED ORDER — HEPARIN SOD (PORK) LOCK FLUSH 100 UNIT/ML IV SOLN
500.0000 [IU] | Freq: Once | INTRAVENOUS | Status: DC | PRN
  Filled 2019-03-09: qty 5

## 2019-03-09 MED ORDER — SODIUM CHLORIDE 0.9 % IV SOLN
80.0000 mg/m2 | Freq: Once | INTRAVENOUS | Status: AC
  Administered 2019-03-09: 150 mg via INTRAVENOUS
  Filled 2019-03-09: qty 25

## 2019-03-09 MED ORDER — DEXAMETHASONE SODIUM PHOSPHATE 10 MG/ML IJ SOLN
10.0000 mg | Freq: Once | INTRAMUSCULAR | Status: AC
  Administered 2019-03-09: 10:00:00 10 mg via INTRAVENOUS

## 2019-03-09 MED ORDER — SODIUM CHLORIDE 0.9% FLUSH
10.0000 mL | Freq: Once | INTRAVENOUS | Status: AC
  Administered 2019-03-09: 10 mL
  Filled 2019-03-09: qty 10

## 2019-03-09 MED ORDER — FAMOTIDINE IN NACL 20-0.9 MG/50ML-% IV SOLN
20.0000 mg | Freq: Once | INTRAVENOUS | Status: AC
  Administered 2019-03-09: 20 mg via INTRAVENOUS

## 2019-03-09 MED ORDER — DEXAMETHASONE SODIUM PHOSPHATE 10 MG/ML IJ SOLN
INTRAMUSCULAR | Status: AC
  Filled 2019-03-09: qty 1

## 2019-03-09 MED ORDER — SODIUM CHLORIDE 0.9% FLUSH
10.0000 mL | INTRAVENOUS | Status: DC | PRN
  Filled 2019-03-09: qty 10

## 2019-03-09 NOTE — Patient Instructions (Signed)
[facility] Discharge Instructions for Patients Receiving Chemotherapy  Today you received the following chemotherapy agents:  Taxol.  To help prevent nausea and vomiting after your treatment, we encourage you to take your nausea medication as directed.   If you develop nausea and vomiting that is not controlled by your nausea medication, call the clinic.   BELOW ARE SYMPTOMS THAT SHOULD BE REPORTED IMMEDIATELY:  *FEVER GREATER THAN 100.5 F  *CHILLS WITH OR WITHOUT FEVER  NAUSEA AND VOMITING THAT IS NOT CONTROLLED WITH YOUR NAUSEA MEDICATION  *UNUSUAL SHORTNESS OF BREATH  *UNUSUAL BRUISING OR BLEEDING  TENDERNESS IN MOUTH AND THROAT WITH OR WITHOUT PRESENCE OF ULCERS  *URINARY PROBLEMS  *BOWEL PROBLEMS  UNUSUAL RASH Items with * indicate a potential emergency and should be followed up as soon as possible.  Feel free to call the clinic should you have any questions or concerns. The clinic phone number is [phone].  Please show the CHEMO ALERT CARD at check-in to the Emergency Department and triage nurse.   

## 2019-03-10 ENCOUNTER — Telehealth: Payer: Self-pay | Admitting: *Deleted

## 2019-03-10 NOTE — Telephone Encounter (Signed)
This RN spoke with pt who states she is scheduled for her 1st Covid vaccine this Sunday.  She would like to keep her appts as scheduled on Thursdays- and skip the 11th per MD protocol.  This RN informed her above appropriate- as well as need to know date of next vaccine for other hold of therapy.  Nona will call when she knows whether she received the [place] or [employer].

## 2019-03-11 ENCOUNTER — Other Ambulatory Visit: Payer: Self-pay

## 2019-03-11 ENCOUNTER — Ambulatory Visit: Payer: BC Managed Care – PPO | Attending: Internal Medicine

## 2019-03-11 DIAGNOSIS — Z23 Encounter for immunization: Secondary | ICD-10-CM | POA: Insufficient documentation

## 2019-03-11 NOTE — Progress Notes (Signed)
   Covid-19 Vaccination Clinic  Name:  Kaylee Romero    MRN: [id number] DOB: December 20, 1980  03/11/2019  [name] was observed post Covid-19 immunization for 15 minutes without incident. She was provided with Vaccine Information Sheet and instruction to access the V-Safe system.   [name] was instructed to call 911 with any severe reactions post vaccine: Marland Kitchen Difficulty breathing  . Swelling of face and throat  . A fast heartbeat  . A bad rash all over body  . Dizziness and weakness   Immunizations Administered    Name Date Dose VIS Date Route   Moderna COVID-19 Vaccine 03/11/2019  4:43 PM 0.5 mL 12/06/2018 Intramuscular   Manufacturer: Moderna   Lot: [id number]   [id number]

## 2019-03-16 ENCOUNTER — Inpatient Hospital Stay: Payer: BC Managed Care – PPO

## 2019-03-19 ENCOUNTER — Other Ambulatory Visit: Payer: Self-pay | Admitting: Oncology

## 2019-03-19 DIAGNOSIS — C50411 Malignant neoplasm of upper-outer quadrant of right female breast: Secondary | ICD-10-CM

## 2019-03-20 ENCOUNTER — Other Ambulatory Visit: Payer: Self-pay | Admitting: Oncology

## 2019-03-20 DIAGNOSIS — Z170 Estrogen receptor positive status [ER+]: Secondary | ICD-10-CM

## 2019-03-20 DIAGNOSIS — C50411 Malignant neoplasm of upper-outer quadrant of right female breast: Secondary | ICD-10-CM

## 2019-03-20 MED ORDER — LORAZEPAM 0.5 MG PO TABS: 0.5000 mg | ORAL_TABLET | Freq: Every evening | ORAL | 0 refills | Status: DC | PRN

## 2019-03-22 ENCOUNTER — Other Ambulatory Visit: Payer: Self-pay | Admitting: Dermatology

## 2019-03-22 NOTE — Progress Notes (Signed)
[facility]  Telephone:([phone]) [phone] Fax:(336) [phone]     ID: ARMIYAH CAPRON DOB: March 14, 1980  MR#: [id number]  KYH#:[id number]  Patient Care Team: Linda Hedges, DO as PCP - General (Obstetrics and Gynecology) Mauro Kaufmann, RN as Oncology Nurse Navigator Rockwell Germany, RN as Oncology Nurse Navigator Gery Pray, MD as Consulting Physician (Radiation Oncology) Telisa Ohlsen, Virgie Dad, MD as Consulting Physician (Oncology) Jovita Kussmaul, MD as Consulting Physician (General Surgery) [place], Loel Lofty, DO as Attending Physician (Plastic Surgery) Chauncey Cruel, MD OTHER MD:  CHIEF COMPLAINT: estrogen receptor positive breast cancer  CURRENT TREATMENT: Adjuvant chemotherapy   INTERVAL HISTORY: Kaylee Romero  returns today for follow-up and treatment of her estrogen receptor positive breast cancer.   She continues weekly Paclitaxel.  Today is her third week.  As far as the chemotherapy itself goes she is tolerating it very well, with no side effects.  Specifically she has had no nausea.  She is not even using the Compazine.  She is also not having any nasal dyscrasia and no peripheral neuropathy symptoms.  Her most recent echocardiogram on 10/27/2018 showed an ejection fraction of 55-60%.  She received her first Covid-19 vaccine dose on 03/11/2019.  Since that time she has had some "spells", which she describes as chills and sweats.  This tends to happen more at night and during the day.  She can get it a whole body ache sometimes whole body stabbing pains.  These are not lasting and they do not happen daily.  They may be accompanied by a mild temperature increase to about 99.5.  She also has some fatigue.   REVIEW OF SYSTEMS: Anntoinette is currently not exercising regularly.  She feels overloaded.  Between the school her daughter her family her cancer and life in general is just too much.  She feels anxious and is planning to go on leave of absence for the rest of the semester  which I think is very reasonable.    HISTORY OF CURRENT ILLNESS: From the original intake note:  Kaylee Romero (pronounced "Bram-Hall") found a palpable lump in the 12 o'clock position of the right breast, which he brought to the attention of her gynecologist.. She underwent bilateral diagnostic mammography with tomography and bilateral breast ultrasonography at [facility] on 09/23/2018 showing: breast density category C; a spiculated mass with associated with architectural distortion and pleomorphic calcifications lying in the 11-12 o'clock position, middle depth, measuring 2 cm in size, corresponding to the palpable abnormality.   Physical exam confirmed a firm but mobile mass in the 12 o'clock position of the right breast.  By ultrasound there was a highly suspicious 2.1 cm mass in the 12 o'clock position of the right breast; no other evidence of breast malignancy or lymphadenopathy bilaterally.Sonographic evaluation of the right axilla found no enlarged or abnormal appearing lymph node  In the left axilla there were several lymph nodes with areas of cortical prominence similar to the lymph node noted on the right.  Given the symmetry all these are presumed to be reactive  Accordingly on 09/26/2018 she proceeded to biopsy of the right breast area in question. The pathology from this procedure ([id number]) showed: invasive ductal carcinoma, grade 2; prognostic indicators significant for: estrogen receptor, 80% positive with moderate staining intensity and progesterone receptor, 90% positive with strong staining intensity. Proliferation marker Ki67 at 35%. HER2 negative by immunohistochemistry (1+).  The patient's subsequent history is as detailed below.   PAST MEDICAL HISTORY: Past Medical  History:  Diagnosis Date  . Anxiety   . Family history of breast cancer   . HSV (herpes simplex virus) anogenital infection    positive titer only    PAST SURGICAL HISTORY: Past Surgical  History:  Procedure Laterality Date  . BREAST LUMPECTOMY WITH AXILLARY LYMPH NODE BIOPSY Right 11/21/2018   Procedure: RIGHT BREAST LUMPECTOMY WITH SENTINEL LYMPH NODE BIOPSY;  Surgeon: Jovita Kussmaul, MD;  Location: [facility];  Service: General;  Laterality: Right;  . BREAST REDUCTION SURGERY Bilateral 11/21/2018   Procedure: BILATERAL MAMMARY REDUCTION  (BREAST);  Surgeon: Wallace Going, DO;  Location: [facility];  Service: Plastics;  Laterality: Bilateral;  . PORTACATH PLACEMENT N/A 11/21/2018   Procedure: INSERTION LEFT PORT-A-CATH WITH ULTRASOUND GUIDANCE;  Surgeon: Jovita Kussmaul, MD;  Location: [facility];  Service: General;  Laterality: N/A;  . TONSILLECTOMY  1987  . TONSILLECTOMY    . WISDOM TOOTH EXTRACTION      FAMILY HISTORY: Family History  Problem Relation Age of Onset  . Hypertension Father   . Alcohol abuse Paternal Grandfather   . Heart disease Paternal Grandfather   . Alcohol abuse Maternal Grandfather   . Heart disease Maternal Grandmother   . Breast cancer Other    Patient's parents are both living at age 57, as of 09/2018. Her father lives in [place], and her mother lives in [place], [place]. The patient denies a family hx of ovarian cancer. A maternal great aunt was diagnosed with breast cancer twice. She has 2 brothers, no sisters   GYNECOLOGIC HISTORY:  No LMP recorded. (Menstrual status: Chemotherapy). Menarche: 39 years old Age at first live birth: 39 years old [name] 1 Currently having regular periods  Contraceptive: Mirena IUD in place HRT n/a  Hysterectomy? no BSO? no   SOCIAL HISTORY: (updated January 2021) Claire is a [employer]. Husband Tommi Rumps is in management with [employer]. Daughter Normand Sloop is 52 months old.     ADVANCED DIRECTIVES: not in place. Her husband is automatically her HCPOA.   HEALTH MAINTENANCE: Social History   Tobacco Use  . Smoking status: Former Smoker    Packs/day: 0.50    Years: 10.00    Pack years: 5.00    Types:  Cigarettes    Start date: 01/13/2002    Quit date: 10/17/2016    Years since quitting: 2.4  . Smokeless tobacco: Never Used  Substance Use Topics  . Alcohol use: Yes    Comment: Socially  . Drug use: No     Colonoscopy: n/a  PAP: 09/20/2018  Bone density: n/a   Allergies  Allergen Reactions  . Chlorhexidine Gluconate Rash    Current Outpatient Medications  Medication Sig Dispense Refill  . acetaminophen (TYLENOL) 500 MG tablet Take 1 tablet (500 mg total) by mouth every 6 (six) hours as needed. 60 tablet 0  . amphetamine-dextroamphetamine (ADDERALL XR) 20 MG 24 hr capsule Take 20 mg by mouth daily.    . Ascorbic Acid (VITAMIN C) 1000 MG tablet Take 1,000 mg by mouth daily.    . citalopram (CELEXA) 20 MG tablet Take 20 mg by mouth at bedtime.     . clobetasol cream (TEMOVATE) 7.67 % Apply 1 application topically daily.    . hydrOXYzine (ATARAX/VISTARIL) 10 MG tablet Take 10 mg by mouth at bedtime.     . lidocaine-prilocaine (EMLA) cream Apply to affected area once 30 g 3  . LORazepam (ATIVAN) 0.5 MG tablet Take 1 tablet (0.5 mg total) by mouth at  bedtime as needed (Nausea or vomiting). 30 tablet 0  . Multiple Vitamin (MULTIVITAMIN WITH MINERALS) TABS tablet Take 1 tablet by mouth daily.    . naproxen sodium (ALEVE) 220 MG tablet Take 1 tablet (220 mg total) by mouth 3 (three) times daily with meals. 60 tablet 3  . prochlorperazine (COMPAZINE) 10 MG tablet Take 1 tablet (10 mg total) by mouth every 6 (six) hours as needed (Nausea or vomiting). 30 tablet 1  . Tetrahydrozoline HCl (VISINE OP) Place 1 drop into both eyes daily as needed (redness).    . triamcinolone (KENALOG) 0.025 % ointment Apply 1 application topically 2 (two) times daily. 30 g 0  . vitamin B-12 (CYANOCOBALAMIN) 1000 MCG tablet Take 1,000 mcg by mouth daily.     No current facility-administered medications for this visit.    OBJECTIVE: Young white woman in no acute distress  Vitals:   03/23/19 0823  BP: 120/73    Pulse: 95  Resp: 18  Temp: 98.2 F (36.8 C)  SpO2: 99%   Wt Readings from Last 3 Encounters:  03/23/19 185 lb 14.4 oz (84.3 kg)  03/09/19 184 lb 1.6 oz (83.5 kg)  03/02/19 184 lb 11.2 oz (83.8 kg)   Body mass index is 31.91 kg/m.    ECOG FS:1 - Symptomatic but completely ambulatory  Sclerae unicteric, EOMs intact Wearing a mask No cervical or supraclavicular adenopathy Lungs no rales or rhonchi Heart regular rate and rhythm Abd soft, nontender, positive bowel sounds MSK no focal spinal tenderness, no upper extremity lymphedema Neuro: nonfocal, well oriented, appropriate affect Breasts: Deferred   LAB RESULTS:  CMP     Component Value Date/Time   NA 139 03/09/2019 0916   K 4.0 03/09/2019 0916   CL 104 03/09/2019 0916   CO2 25 03/09/2019 0916   GLUCOSE 135 (H) 03/09/2019 0916   BUN 13 03/09/2019 0916   CREATININE 0.66 03/09/2019 0916   CREATININE 0.68 10/05/2018 1244   CALCIUM 8.9 03/09/2019 0916   PROT 6.8 03/09/2019 0916   ALBUMIN 4.0 03/09/2019 0916   AST 29 03/09/2019 0916   AST 15 10/05/2018 1244   ALT 66 (H) 03/09/2019 0916   ALT 18 10/05/2018 1244   ALKPHOS 56 03/09/2019 0916   BILITOT 0.4 03/09/2019 0916   BILITOT 0.5 10/05/2018 1244   GFRNONAA >60 03/09/2019 0916   GFRNONAA >60 10/05/2018 1244   GFRAA >60 03/09/2019 0916   GFRAA >60 10/05/2018 1244    No results found for: TOTALPROTELP, ALBUMINELP, A1GS, A2GS, BETS, BETA2SER, GAMS, MSPIKE, SPEI  No results found for: KPAFRELGTCHN, LAMBDASER, KAPLAMBRATIO  Lab Results  Component Value Date   WBC 12.1 (H) 03/23/2019   NEUTROABS 9.4 (H) 03/23/2019   HGB 11.1 (L) 03/23/2019   HCT 33.0 (L) 03/23/2019   MCV 95.9 03/23/2019   PLT 345 03/23/2019    No results found for: LABCA2  No components found for: [id number]  No results for input(s): INR in the last 168 hours.  No results found for: LABCA2  No results found for: WGN562  No results found for: ZHY865  No results found for:  HQI696  No results found for: CA2729  No components found for: HGQUANT  No results found for: CEA1 / No results found for: CEA1   No results found for: AFPTUMOR  No results found for: CHROMOGRNA  No results found for: HGBA, HGBA2QUANT, HGBFQUANT, HGBSQUAN (Hemoglobinopathy evaluation)   No results found for: LDH  No results found for: IRON, TIBC, IRONPCTSAT (Iron and TIBC)  No results found for: FERRITIN  Urinalysis    Component Value Date/Time   BILIRUBINUR Neg 01/18/2012 1458   PROTEINUR Neg 01/18/2012 1458   UROBILINOGEN 0.2 01/18/2012 1458   NITRITE Neg 01/18/2012 1458   LEUKOCYTESUR Negative 01/18/2012 1458     STUDIES: No results found.   ELIGIBLE FOR AVAILABLE RESEARCH PROTOCOL: no  ASSESSMENT: 38 y.o. [place] woman status post right breast upper inner quadrant biopsy on 09/26/2018 for a clinical T2 N0, stage IB invasive ductal carcinoma, grade 2, estrogen and progesterone receptor positive, HER-2 nonamplified, with an MIB-1-1 of 35%  (1) Genetic testing reported on 10/05/2018 through the [facility] Breast Cancer STAT Panel + Common Hereditary cancer panel found no pathogenic mutations. The STAT Breast cancer panel offered by [facility] includes sequencing and rearrangement analysis for the following 9 genes:  ATM, BRCA1, BRCA2, CDH1, CHEK2, PALB2, PTEN, STK11 and TP53. The Common Hereditary Cancers Panel offered by [facility] includes sequencing and/or deletion duplication testing of the following 47 genes: APC, ATM, AXIN2, BARD1, BMPR1A, BRCA1, BRCA2, BRIP1, CDH1, CDKN2A (p14ARF), CDKN2A (p16INK4a), CKD4, CHEK2, CTNNA1, DICER1, EPCAM (Deletion/duplication testing only), GREM1 (promoter region deletion/duplication testing only), KIT, MEN1, MLH1, MSH2, MSH3, MSH6, MUTYH, NBN, NF1, NHTL1, PALB2, PDGFRA, PMS2, POLD1, POLE, PTEN, RAD50, RAD51C, RAD51D, SDHB, SDHC, SDHD, SMAD4, SMARCA4. STK11, TP53, TSC1, TSC2, and VHL.  The following genes were evaluated for sequence changes  only: SDHA and HOXB13 c.251G>A variant only.   (2) MammaPrint obtained from the original breast biopsy returned "high risk" indicating the patient will have an excellent prognosis if she receives adjuvant chemotherapy  (3) status post right lumpectomy and right axillary sentinel lymph node sampling 11/21/2018 for a pT2 pN1, stage IIA invasive ductal carcinoma, grade 2, with negative margins  (a) a total of 4 sentinel lymph nodes removed, 1 with macrometastasis  (b) status post bilateral breast reductions at the time of lumpectomy  (4) adjuvant chemotherapy consisting of cyclophosphamide and doxorubicin in dose dense fashion x4 started 01/05/2019, completed 02/15/2019, followed by weekly paclitaxel x12 started 03/02/2019  (a) echo 10/27/2018 shows an ejection fraction in the 55-60% range  (5) adjuvant radiation to follow, in [place] per patient's request  (6) tamoxifen started 10/05/2018 in anticipation of surgical delays, discontinued 12/05/2018   PLAN: Latrisha is tolerating paclitaxel well, with no evidence of neuropathy, and she is proceeding to her third of 12 planned weekly doses today.  I think she is experiencing early menopause.  She has not had a period since the start of chemotherapy.  I suspect the spells and chills and sweats that she is experiencing particularly at night are not related to her Covid vaccine but to menopause.  She understands insomnia, weight gain, mood changes, as well as hot flashes are very common in this situation.  I think she may benefit from gabapentin at bedtime and we discussed that today.  I have entered that prescription.  She will let us know if that is helpful, or if it causes her any side effects, which would be unusual at this dose.  I have encouraged her to walk regularly.  I think that would help the fatigue part.  Of course it also helps the bones the heart and the mood.  She is having some gum bleeding.  She has not been to her dentist since  last summer.  Once she is done with chemo she will benefit from updating her dental care.  I think her taking a leave of absence for the rest of the semester is  a very reasonable option and we can help her complete those papers as needed  Otherwise we are going to continue weekly treatments and we are going to see her every other treatment until the age treatment after which we will see her every week until she is done.  Total encounter time 30 minutes.Sarajane Jews C. Mahamud Metts, MD 03/23/19 8:46 AM Medical Oncology and Hematology [facility] [place], [place] [address] Tel. [phone]    Fax. [phone]   I, Wilburn Mylar, am acting as scribe for Dr. Virgie Dad. Gardenia Witter.  I, Lurline Del MD, have reviewed the above documentation for accuracy and completeness, and I agree with the above.    *Total Encounter Time as defined by the Centers for Medicare and Medicaid Services includes, in addition to the face-to-face time of a patient visit (documented in the note above) non-face-to-face time: obtaining and reviewing outside history, ordering and reviewing medications, tests or procedures, care coordination (communications with other health care professionals or caregivers) and documentation in the medical record.

## 2019-03-23 ENCOUNTER — Other Ambulatory Visit: Payer: Self-pay

## 2019-03-23 ENCOUNTER — Inpatient Hospital Stay: Payer: BC Managed Care – PPO

## 2019-03-23 ENCOUNTER — Inpatient Hospital Stay (HOSPITAL_BASED_OUTPATIENT_CLINIC_OR_DEPARTMENT_OTHER): Payer: BC Managed Care – PPO | Admitting: Oncology

## 2019-03-23 ENCOUNTER — Other Ambulatory Visit: Payer: BC Managed Care – PPO

## 2019-03-23 VITALS — BP 120/73 | HR 95 | Temp 98.2°F | Resp 18 | Ht 64.0 in | Wt 185.9 lb

## 2019-03-23 DIAGNOSIS — C50411 Malignant neoplasm of upper-outer quadrant of right female breast: Secondary | ICD-10-CM

## 2019-03-23 DIAGNOSIS — Z170 Estrogen receptor positive status [ER+]: Secondary | ICD-10-CM

## 2019-03-23 DIAGNOSIS — Z5111 Encounter for antineoplastic chemotherapy: Secondary | ICD-10-CM | POA: Diagnosis not present

## 2019-03-23 DIAGNOSIS — Z95828 Presence of other vascular implants and grafts: Secondary | ICD-10-CM

## 2019-03-23 LAB — COMPREHENSIVE METABOLIC PANEL
ALT: 39 U/L (ref 0–44)
AST: 23 U/L (ref 15–41)
Albumin: 3.6 g/dL (ref 3.5–5.0)
Alkaline Phosphatase: 59 U/L (ref 38–126)
Anion gap: 10 (ref 5–15)
BUN: 10 mg/dL (ref 6–20)
CO2: 25 mmol/L (ref 22–32)
Calcium: 9 mg/dL (ref 8.9–10.3)
Chloride: 106 mmol/L (ref 98–111)
Creatinine, Ser: 0.63 mg/dL (ref 0.44–1.00)
GFR calc Af Amer: >60 mL/min (ref >60)
GFR calc non Af Amer: >60 mL/min (ref >60)
Glucose, Bld: 88 mg/dL (ref 70–99)
Potassium: 4.6 mmol/L (ref 3.5–5.1)
Sodium: 141 mmol/L (ref 135–145)
Total Bilirubin: 0.3 mg/dL (ref 0.3–1.2)
Total Protein: 6.6 g/dL (ref 6.5–8.1)

## 2019-03-23 LAB — CBC WITH DIFFERENTIAL/PLATELET
Abs Immature Granulocytes: 0.12 10*3/uL — ABNORMAL HIGH (ref 0.00–0.07)
Basophils Absolute: 0.1 10*3/uL (ref 0.0–0.1)
Basophils Relative: 1 %
Eosinophils Absolute: 0.3 10*3/uL (ref 0.0–0.5)
Eosinophils Relative: 2 %
HCT: 33 % — ABNORMAL LOW (ref 36.0–46.0)
Hemoglobin: 11.1 g/dL — ABNORMAL LOW (ref 12.0–15.0)
Immature Granulocytes: 1 %
Lymphocytes Relative: 11 %
Lymphs Abs: 1.3 10*3/uL (ref 0.7–4.0)
MCH: 32.3 pg (ref 26.0–34.0)
MCHC: 33.6 g/dL (ref 30.0–36.0)
MCV: 95.9 fL (ref 80.0–100.0)
Monocytes Absolute: 0.9 10*3/uL (ref 0.1–1.0)
Monocytes Relative: 8 %
Neutro Abs: 9.4 10*3/uL — ABNORMAL HIGH (ref 1.7–7.7)
Neutrophils Relative %: 77 %
Platelets: 345 10*3/uL (ref 150–400)
RBC: 3.44 MIL/uL — ABNORMAL LOW (ref 3.87–5.11)
RDW: 15.7 % — ABNORMAL HIGH (ref 11.5–15.5)
WBC: 12.1 10*3/uL — ABNORMAL HIGH (ref 4.0–10.5)
nRBC: 0 % (ref 0.0–0.2)

## 2019-03-23 MED ORDER — DEXAMETHASONE SODIUM PHOSPHATE 10 MG/ML IJ SOLN
INTRAMUSCULAR | Status: AC
  Filled 2019-03-23: qty 1

## 2019-03-23 MED ORDER — SODIUM CHLORIDE 0.9% FLUSH
10.0000 mL | INTRAVENOUS | Status: DC | PRN
  Administered 2019-03-23: 10 mL
  Filled 2019-03-23: qty 10

## 2019-03-23 MED ORDER — SODIUM CHLORIDE 0.9 % IV SOLN
80.0000 mg/m2 | Freq: Once | INTRAVENOUS | Status: AC
  Administered 2019-03-23: 11:00:00 150 mg via INTRAVENOUS
  Filled 2019-03-23: qty 25

## 2019-03-23 MED ORDER — GABAPENTIN 300 MG PO CAPS: 300.0000 mg | ORAL_CAPSULE | Freq: Every day | ORAL | 4 refills | Status: DC

## 2019-03-23 MED ORDER — DEXAMETHASONE SODIUM PHOSPHATE 10 MG/ML IJ SOLN
4.0000 mg | Freq: Once | INTRAMUSCULAR | Status: AC
  Administered 2019-03-23: 4 mg via INTRAVENOUS

## 2019-03-23 MED ORDER — DIPHENHYDRAMINE HCL 50 MG/ML IJ SOLN
25.0000 mg | Freq: Once | INTRAMUSCULAR | Status: AC
  Administered 2019-03-23: 10:00:00 25 mg via INTRAVENOUS

## 2019-03-23 MED ORDER — SODIUM CHLORIDE 0.9% FLUSH
10.0000 mL | Freq: Once | INTRAVENOUS | Status: AC
  Administered 2019-03-23: 10 mL
  Filled 2019-03-23: qty 10

## 2019-03-23 MED ORDER — HEPARIN SOD (PORK) LOCK FLUSH 100 UNIT/ML IV SOLN
500.0000 [IU] | Freq: Once | INTRAVENOUS | Status: AC | PRN
  Administered 2019-03-23: 12:00:00 500 [IU]
  Filled 2019-03-23: qty 5

## 2019-03-23 MED ORDER — SODIUM CHLORIDE 0.9 % IV SOLN
Freq: Once | INTRAVENOUS | Status: AC
  Filled 2019-03-23: qty 250

## 2019-03-23 MED ORDER — DIPHENHYDRAMINE HCL 50 MG/ML IJ SOLN
INTRAMUSCULAR | Status: AC
  Filled 2019-03-23: qty 1

## 2019-03-23 MED ORDER — FAMOTIDINE IN NACL 20-0.9 MG/50ML-% IV SOLN
20.0000 mg | Freq: Once | INTRAVENOUS | Status: AC
  Administered 2019-03-23: 10:00:00 20 mg via INTRAVENOUS

## 2019-03-23 MED ORDER — FAMOTIDINE IN NACL 20-0.9 MG/50ML-% IV SOLN
INTRAVENOUS | Status: AC
  Filled 2019-03-23: qty 50

## 2019-03-24 ENCOUNTER — Telehealth: Payer: Self-pay | Admitting: Oncology

## 2019-03-24 ENCOUNTER — Encounter: Payer: Self-pay | Admitting: *Deleted

## 2019-03-24 ENCOUNTER — Telehealth: Payer: Self-pay | Admitting: *Deleted

## 2019-03-24 NOTE — Telephone Encounter (Signed)
Checked pt out. No changes made to schedule. No 3/18 los.

## 2019-03-24 NOTE — Progress Notes (Signed)
Pharmacist Chemotherapy Monitoring - Follow Up Assessment    I verify that I have reviewed each item in the below checklist:  . Regimen for the patient is scheduled for the appropriate day and plan matches scheduled date. Marland Kitchen Appropriate non-routine labs are ordered dependent on drug ordered. . If applicable, additional medications reviewed and ordered per protocol based on lifetime cumulative doses and/or treatment regimen.   Plan for follow-up and/or issues identified: No . I-vent associated with next due treatment: No . MD and/or nursing notified: No  Philomena Course 03/24/2019 9:06 AM

## 2019-03-24 NOTE — Telephone Encounter (Signed)
Letter sent for out of work request per pt.

## 2019-03-27 ENCOUNTER — Telehealth: Payer: Self-pay | Admitting: Oncology

## 2019-03-27 NOTE — Telephone Encounter (Signed)
No changes made. Appts were already scheduled more than 3 treatments out

## 2019-03-30 ENCOUNTER — Other Ambulatory Visit: Payer: Self-pay

## 2019-03-30 ENCOUNTER — Inpatient Hospital Stay: Payer: BC Managed Care – PPO

## 2019-03-30 ENCOUNTER — Other Ambulatory Visit: Payer: Self-pay | Admitting: *Deleted

## 2019-03-30 VITALS — BP 119/86 | HR 94 | Temp 98.2°F | Resp 18

## 2019-03-30 DIAGNOSIS — C50411 Malignant neoplasm of upper-outer quadrant of right female breast: Secondary | ICD-10-CM

## 2019-03-30 DIAGNOSIS — Z95828 Presence of other vascular implants and grafts: Secondary | ICD-10-CM

## 2019-03-30 DIAGNOSIS — Z5111 Encounter for antineoplastic chemotherapy: Secondary | ICD-10-CM | POA: Diagnosis not present

## 2019-03-30 LAB — CBC WITH DIFFERENTIAL/PLATELET
Abs Immature Granulocytes: 0.03 10*3/uL (ref 0.00–0.07)
Basophils Absolute: 0.1 10*3/uL (ref 0.0–0.1)
Basophils Relative: 1 %
Eosinophils Absolute: 0.3 10*3/uL (ref 0.0–0.5)
Eosinophils Relative: 6 %
HCT: 33.4 % — ABNORMAL LOW (ref 36.0–46.0)
Hemoglobin: 11.3 g/dL — ABNORMAL LOW (ref 12.0–15.0)
Immature Granulocytes: 1 %
Lymphocytes Relative: 20 %
Lymphs Abs: 1.1 10*3/uL (ref 0.7–4.0)
MCH: 32.5 pg (ref 26.0–34.0)
MCHC: 33.8 g/dL (ref 30.0–36.0)
MCV: 96 fL (ref 80.0–100.0)
Monocytes Absolute: 0.5 10*3/uL (ref 0.1–1.0)
Monocytes Relative: 8 %
Neutro Abs: 3.5 10*3/uL (ref 1.7–7.7)
Neutrophils Relative %: 64 %
Platelets: 339 10*3/uL (ref 150–400)
RBC: 3.48 MIL/uL — ABNORMAL LOW (ref 3.87–5.11)
RDW: 14.7 % (ref 11.5–15.5)
WBC: 5.5 10*3/uL (ref 4.0–10.5)
nRBC: 0 % (ref 0.0–0.2)

## 2019-03-30 LAB — COMPREHENSIVE METABOLIC PANEL
ALT: 45 U/L — ABNORMAL HIGH (ref 0–44)
AST: 23 U/L (ref 15–41)
Albumin: 3.9 g/dL (ref 3.5–5.0)
Alkaline Phosphatase: 62 U/L (ref 38–126)
Anion gap: 9 (ref 5–15)
BUN: 11 mg/dL (ref 6–20)
CO2: 27 mmol/L (ref 22–32)
Calcium: 9.3 mg/dL (ref 8.9–10.3)
Chloride: 103 mmol/L (ref 98–111)
Creatinine, Ser: 0.66 mg/dL (ref 0.44–1.00)
GFR calc Af Amer: >60 mL/min (ref >60)
GFR calc non Af Amer: >60 mL/min (ref >60)
Glucose, Bld: 97 mg/dL (ref 70–99)
Potassium: 4.3 mmol/L (ref 3.5–5.1)
Sodium: 139 mmol/L (ref 135–145)
Total Bilirubin: 0.4 mg/dL (ref 0.3–1.2)
Total Protein: 6.9 g/dL (ref 6.5–8.1)

## 2019-03-30 MED ORDER — FAMOTIDINE IN NACL 20-0.9 MG/50ML-% IV SOLN
20.0000 mg | Freq: Once | INTRAVENOUS | Status: AC
  Administered 2019-03-30: 20 mg via INTRAVENOUS

## 2019-03-30 MED ORDER — SODIUM CHLORIDE 0.9 % IV SOLN
Freq: Once | INTRAVENOUS | Status: AC
  Filled 2019-03-30: qty 250

## 2019-03-30 MED ORDER — DIPHENHYDRAMINE HCL 50 MG/ML IJ SOLN
25.0000 mg | Freq: Once | INTRAMUSCULAR | Status: AC
  Administered 2019-03-30: 25 mg via INTRAVENOUS

## 2019-03-30 MED ORDER — FAMOTIDINE IN NACL 20-0.9 MG/50ML-% IV SOLN
INTRAVENOUS | Status: AC
  Filled 2019-03-30: qty 50

## 2019-03-30 MED ORDER — DEXAMETHASONE SODIUM PHOSPHATE 10 MG/ML IJ SOLN
4.0000 mg | Freq: Once | INTRAMUSCULAR | Status: AC
  Administered 2019-03-30: 4 mg via INTRAVENOUS

## 2019-03-30 MED ORDER — SODIUM CHLORIDE 0.9 % IV SOLN
80.0000 mg/m2 | Freq: Once | INTRAVENOUS | Status: AC
  Administered 2019-03-30: 150 mg via INTRAVENOUS
  Filled 2019-03-30: qty 25

## 2019-03-30 MED ORDER — HYDROCORTISONE ACETATE 25 MG RE SUPP: 25.0000 mg | Freq: Two times a day (BID) | RECTAL | 1 refills | Status: DC | PRN

## 2019-03-30 MED ORDER — SODIUM CHLORIDE 0.9% FLUSH
10.0000 mL | INTRAVENOUS | Status: DC | PRN
  Administered 2019-03-30: 10 mL
  Filled 2019-03-30: qty 10

## 2019-03-30 MED ORDER — SODIUM CHLORIDE 0.9% FLUSH
10.0000 mL | Freq: Once | INTRAVENOUS | Status: AC
  Administered 2019-03-30: 10 mL
  Filled 2019-03-30: qty 10

## 2019-03-30 MED ORDER — DIPHENHYDRAMINE HCL 50 MG/ML IJ SOLN
INTRAMUSCULAR | Status: AC
  Filled 2019-03-30: qty 1

## 2019-03-30 MED ORDER — HEPARIN SOD (PORK) LOCK FLUSH 100 UNIT/ML IV SOLN
500.0000 [IU] | Freq: Once | INTRAVENOUS | Status: AC
  Administered 2019-03-30: 500 [IU]
  Filled 2019-03-30: qty 5

## 2019-03-30 MED ORDER — HEPARIN SOD (PORK) LOCK FLUSH 100 UNIT/ML IV SOLN
500.0000 [IU] | Freq: Once | INTRAVENOUS | Status: DC | PRN
  Filled 2019-03-30: qty 5

## 2019-03-30 MED ORDER — DEXAMETHASONE SODIUM PHOSPHATE 10 MG/ML IJ SOLN
INTRAMUSCULAR | Status: AC
  Filled 2019-03-30: qty 1

## 2019-03-30 NOTE — Patient Instructions (Signed)
[facility] Discharge Instructions for Patients Receiving Chemotherapy  Today you received the following chemotherapy agents: paclitaxel.  To help prevent nausea and vomiting after your treatment, we encourage you to take your nausea medication as directed.   If you develop nausea and vomiting that is not controlled by your nausea medication, call the clinic.   BELOW ARE SYMPTOMS THAT SHOULD BE REPORTED IMMEDIATELY:  *FEVER GREATER THAN 100.5 F  *CHILLS WITH OR WITHOUT FEVER  NAUSEA AND VOMITING THAT IS NOT CONTROLLED WITH YOUR NAUSEA MEDICATION  *UNUSUAL SHORTNESS OF BREATH  *UNUSUAL BRUISING OR BLEEDING  TENDERNESS IN MOUTH AND THROAT WITH OR WITHOUT PRESENCE OF ULCERS  *URINARY PROBLEMS  *BOWEL PROBLEMS  UNUSUAL RASH Items with * indicate a potential emergency and should be followed up as soon as possible.  Feel free to call the clinic should you have any questions or concerns. The clinic phone number is [phone].  Please show the CHEMO ALERT CARD at check-in to the Emergency Department and triage nurse.   

## 2019-03-31 NOTE — Progress Notes (Signed)
Pharmacist Chemotherapy Monitoring - Follow Up Assessment    I verify that I have reviewed each item in the below checklist:  . Regimen for the patient is scheduled for the appropriate day and plan matches scheduled date. Marland Kitchen Appropriate non-routine labs are ordered dependent on drug ordered. . If applicable, additional medications reviewed and ordered per protocol based on lifetime cumulative doses and/or treatment regimen.   Plan for follow-up and/or issues identified: No . I-vent associated with next due treatment: No . MD and/or nursing notified: No  Philomena Course 03/31/2019 9:44 AM

## 2019-04-05 ENCOUNTER — Encounter: Payer: Self-pay | Admitting: *Deleted

## 2019-04-06 ENCOUNTER — Inpatient Hospital Stay (HOSPITAL_BASED_OUTPATIENT_CLINIC_OR_DEPARTMENT_OTHER): Payer: BC Managed Care – PPO | Admitting: Adult Health

## 2019-04-06 ENCOUNTER — Inpatient Hospital Stay: Payer: BC Managed Care – PPO

## 2019-04-06 ENCOUNTER — Encounter: Payer: Self-pay | Admitting: Adult Health

## 2019-04-06 ENCOUNTER — Other Ambulatory Visit: Payer: Self-pay

## 2019-04-06 ENCOUNTER — Inpatient Hospital Stay: Payer: BC Managed Care – PPO | Attending: Oncology

## 2019-04-06 VITALS — BP 129/84 | HR 92 | Temp 98.0°F | Resp 17 | Ht 64.0 in | Wt 188.8 lb

## 2019-04-06 DIAGNOSIS — C50811 Malignant neoplasm of overlapping sites of right female breast: Secondary | ICD-10-CM | POA: Insufficient documentation

## 2019-04-06 DIAGNOSIS — Z170 Estrogen receptor positive status [ER+]: Secondary | ICD-10-CM | POA: Diagnosis not present

## 2019-04-06 DIAGNOSIS — N951 Menopausal and female climacteric states: Secondary | ICD-10-CM | POA: Diagnosis not present

## 2019-04-06 DIAGNOSIS — C50411 Malignant neoplasm of upper-outer quadrant of right female breast: Secondary | ICD-10-CM

## 2019-04-06 DIAGNOSIS — Z5111 Encounter for antineoplastic chemotherapy: Secondary | ICD-10-CM | POA: Insufficient documentation

## 2019-04-06 DIAGNOSIS — R5383 Other fatigue: Secondary | ICD-10-CM | POA: Insufficient documentation

## 2019-04-06 DIAGNOSIS — Z95828 Presence of other vascular implants and grafts: Secondary | ICD-10-CM

## 2019-04-06 DIAGNOSIS — G629 Polyneuropathy, unspecified: Secondary | ICD-10-CM | POA: Diagnosis not present

## 2019-04-06 LAB — COMPREHENSIVE METABOLIC PANEL
ALT: 32 U/L (ref 0–44)
AST: 18 U/L (ref 15–41)
Albumin: 3.8 g/dL (ref 3.5–5.0)
Alkaline Phosphatase: 60 U/L (ref 38–126)
Anion gap: 12 (ref 5–15)
BUN: 12 mg/dL (ref 6–20)
CO2: 24 mmol/L (ref 22–32)
Calcium: 9 mg/dL (ref 8.9–10.3)
Chloride: 105 mmol/L (ref 98–111)
Creatinine, Ser: 0.63 mg/dL (ref 0.44–1.00)
GFR calc Af Amer: >60 mL/min (ref >60)
GFR calc non Af Amer: >60 mL/min (ref >60)
Glucose, Bld: 90 mg/dL (ref 70–99)
Potassium: 4.5 mmol/L (ref 3.5–5.1)
Sodium: 141 mmol/L (ref 135–145)
Total Bilirubin: 0.4 mg/dL (ref 0.3–1.2)
Total Protein: 6.8 g/dL (ref 6.5–8.1)

## 2019-04-06 LAB — CBC WITH DIFFERENTIAL/PLATELET
Abs Immature Granulocytes: 0.02 10*3/uL (ref 0.00–0.07)
Basophils Absolute: 0.1 10*3/uL (ref 0.0–0.1)
Basophils Relative: 1 %
Eosinophils Absolute: 0.3 10*3/uL (ref 0.0–0.5)
Eosinophils Relative: 6 %
HCT: 32.7 % — ABNORMAL LOW (ref 36.0–46.0)
Hemoglobin: 11.1 g/dL — ABNORMAL LOW (ref 12.0–15.0)
Immature Granulocytes: 0 %
Lymphocytes Relative: 22 %
Lymphs Abs: 1.1 10*3/uL (ref 0.7–4.0)
MCH: 33.5 pg (ref 26.0–34.0)
MCHC: 33.9 g/dL (ref 30.0–36.0)
MCV: 98.8 fL (ref 80.0–100.0)
Monocytes Absolute: 0.4 10*3/uL (ref 0.1–1.0)
Monocytes Relative: 8 %
Neutro Abs: 3 10*3/uL (ref 1.7–7.7)
Neutrophils Relative %: 63 %
Platelets: 351 10*3/uL (ref 150–400)
RBC: 3.31 MIL/uL — ABNORMAL LOW (ref 3.87–5.11)
RDW: 14.5 % (ref 11.5–15.5)
WBC: 4.8 10*3/uL (ref 4.0–10.5)
nRBC: 0 % (ref 0.0–0.2)

## 2019-04-06 MED ORDER — FAMOTIDINE IN NACL 20-0.9 MG/50ML-% IV SOLN
20.0000 mg | Freq: Once | INTRAVENOUS | Status: AC
  Administered 2019-04-06: 20 mg via INTRAVENOUS

## 2019-04-06 MED ORDER — DIPHENHYDRAMINE HCL 50 MG/ML IJ SOLN
INTRAMUSCULAR | Status: AC
  Filled 2019-04-06: qty 1

## 2019-04-06 MED ORDER — SODIUM CHLORIDE 0.9 % IV SOLN
Freq: Once | INTRAVENOUS | Status: AC
  Filled 2019-04-06: qty 250

## 2019-04-06 MED ORDER — DEXAMETHASONE SODIUM PHOSPHATE 10 MG/ML IJ SOLN
INTRAMUSCULAR | Status: AC
  Filled 2019-04-06: qty 1

## 2019-04-06 MED ORDER — FAMOTIDINE IN NACL 20-0.9 MG/50ML-% IV SOLN
INTRAVENOUS | Status: AC
  Filled 2019-04-06: qty 50

## 2019-04-06 MED ORDER — HEPARIN SOD (PORK) LOCK FLUSH 100 UNIT/ML IV SOLN
500.0000 [IU] | Freq: Once | INTRAVENOUS | Status: AC | PRN
  Administered 2019-04-06: 500 [IU]
  Filled 2019-04-06: qty 5

## 2019-04-06 MED ORDER — SODIUM CHLORIDE 0.9% FLUSH
10.0000 mL | Freq: Once | INTRAVENOUS | Status: AC
  Administered 2019-04-06: 09:00:00 10 mL
  Filled 2019-04-06: qty 10

## 2019-04-06 MED ORDER — DIPHENHYDRAMINE HCL 50 MG/ML IJ SOLN
25.0000 mg | Freq: Once | INTRAMUSCULAR | Status: AC
  Administered 2019-04-06: 10:00:00 25 mg via INTRAVENOUS

## 2019-04-06 MED ORDER — DEXAMETHASONE SODIUM PHOSPHATE 10 MG/ML IJ SOLN
4.0000 mg | Freq: Once | INTRAMUSCULAR | Status: AC
  Administered 2019-04-06: 10:00:00 4 mg via INTRAVENOUS

## 2019-04-06 MED ORDER — SODIUM CHLORIDE 0.9 % IV SOLN
80.0000 mg/m2 | Freq: Once | INTRAVENOUS | Status: AC
  Administered 2019-04-06: 11:00:00 150 mg via INTRAVENOUS
  Filled 2019-04-06: qty 25

## 2019-04-06 MED ORDER — SODIUM CHLORIDE 0.9% FLUSH
10.0000 mL | INTRAVENOUS | Status: DC | PRN
  Administered 2019-04-06: 12:00:00 10 mL
  Filled 2019-04-06: qty 10

## 2019-04-06 NOTE — Progress Notes (Signed)
[facility]  Telephone:([phone] Fax:([phone]) [phone]     ID: Kaylee Romero DOB: 1980/08/06  MR#: [id number]  XBW#:[id number]  Patient Care Team: Linda Hedges, DO as PCP - General (Obstetrics and Gynecology) Mauro Kaufmann, RN as Oncology Nurse Navigator Rockwell Germany, RN as Oncology Nurse Navigator Gery Pray, MD as Consulting Physician (Radiation Oncology) Magrinat, Virgie Dad, MD as Consulting Physician (Oncology) Jovita Kussmaul, MD as Consulting Physician (General Surgery) Dillingham, Loel Lofty, DO as Attending Physician (Plastic Surgery) Scot Dock, NP OTHER MD:  CHIEF COMPLAINT: estrogen receptor positive breast cancer  CURRENT TREATMENT: Adjuvant chemotherapy   INTERVAL HISTORY: Kaylee Romero  returns today for follow-up and treatment of her estrogen receptor positive breast cancer.   She continues weekly Paclitaxel.  Today is her fifth week of treatment.  She is tolerating this moderately well and has no peripheral neuropathy.     REVIEW OF SYSTEMS: Kaylee Romero's last day of work was yesterday.  She is taking off the rest of the year to finish her cancer treatment.  She is fatigued.  She has had some intermittent episodes of achiness, muscle aches following her covid vaccine, along with a hot flushing sensation.  This happened again about a week ago.  She also notes weight gain and wants to know if she can diet.  She is walking, and doesn't note that she is eating too much, or eating poorly.    Kaylee Romero denies any fever, chills, chest pain, palpitations, cough, bowel/bladder changes, headaches, mucositis, or any further concerns.  A detailed ROS Was otherwise non contributory.    HISTORY OF CURRENT ILLNESS: From the original intake note:  Kaylee Romero (pronounced "Bram-Hall") found a palpable lump in the 12 o'clock position of the right breast, which he brought to the attention of her gynecologist.. She underwent bilateral diagnostic mammography with  tomography and bilateral breast ultrasonography at [facility] on 09/23/2018 showing: breast density category C; a spiculated mass with associated with architectural distortion and pleomorphic calcifications lying in the 11-12 o'clock position, middle depth, measuring 2 cm in size, corresponding to the palpable abnormality.   Physical exam confirmed a firm but mobile mass in the 12 o'clock position of the right breast.  By ultrasound there was a highly suspicious 2.1 cm mass in the 12 o'clock position of the right breast; no other evidence of breast malignancy or lymphadenopathy bilaterally.Sonographic evaluation of the right axilla found no enlarged or abnormal appearing lymph node  In the left axilla there were several lymph nodes with areas of cortical prominence similar to the lymph node noted on the right.  Given the symmetry all these are presumed to be reactive  Accordingly on 09/26/2018 she proceeded to biopsy of the right breast area in question. The pathology from this procedure ([id number]) showed: invasive ductal carcinoma, grade 2; prognostic indicators significant for: estrogen receptor, 80% positive with moderate staining intensity and progesterone receptor, 90% positive with strong staining intensity. Proliferation marker Ki67 at 35%. HER2 negative by immunohistochemistry (1+).  The patient's subsequent history is as detailed below.   PAST MEDICAL HISTORY: Past Medical History:  Diagnosis Date  . Anxiety   . Family history of breast cancer   . HSV (herpes simplex virus) anogenital infection    positive titer only    PAST SURGICAL HISTORY: Past Surgical History:  Procedure Laterality Date  . BREAST LUMPECTOMY WITH AXILLARY LYMPH NODE BIOPSY Right 11/21/2018   Procedure: RIGHT BREAST LUMPECTOMY WITH SENTINEL LYMPH NODE BIOPSY;  Surgeon: Jovita Kussmaul, MD;  Location: [facility];  Service: General;  Laterality: Right;  . BREAST REDUCTION SURGERY Bilateral 11/21/2018   Procedure:  BILATERAL MAMMARY REDUCTION  (BREAST);  Surgeon: Wallace Going, DO;  Location: [facility];  Service: Plastics;  Laterality: Bilateral;  . PORTACATH PLACEMENT N/A 11/21/2018   Procedure: INSERTION LEFT PORT-A-CATH WITH ULTRASOUND GUIDANCE;  Surgeon: Jovita Kussmaul, MD;  Location: [facility];  Service: General;  Laterality: N/A;  . TONSILLECTOMY  1987  . TONSILLECTOMY    . WISDOM TOOTH EXTRACTION      FAMILY HISTORY: Family History  Problem Relation Age of Onset  . Hypertension Father   . Alcohol abuse Paternal Grandfather   . Heart disease Paternal Grandfather   . Alcohol abuse Maternal Grandfather   . Heart disease Maternal Grandmother   . Breast cancer Other    Patient's parents are both living at age 77, as of 09/2018. Her father lives in [place], and her mother lives in [place], [place]. The patient denies a family hx of ovarian cancer. A maternal great aunt was diagnosed with breast cancer twice. She has 2 brothers, no sisters   GYNECOLOGIC HISTORY:  No LMP recorded. (Menstrual status: Chemotherapy). Menarche: 39 years old Age at first live birth: 39 years old [place] P 1 Currently having regular periods  Contraceptive: Mirena IUD in place HRT n/a  Hysterectomy? no BSO? no   SOCIAL HISTORY: (updated January 2021) Kaylee Romero is a [employer]. Husband Tommi Rumps is in management with [employer]. Daughter Normand Sloop is 33 months old.     ADVANCED DIRECTIVES: not in place. Her husband is automatically her HCPOA.   HEALTH MAINTENANCE: Social History   Tobacco Use  . Smoking status: Former Smoker    Packs/day: 0.50    Years: 10.00    Pack years: 5.00    Types: Cigarettes    Start date: 01/13/2002    Quit date: 10/17/2016    Years since quitting: 2.4  . Smokeless tobacco: Never Used  Substance Use Topics  . Alcohol use: Yes    Comment: Socially  . Drug use: No     Colonoscopy: n/a  PAP: 09/20/2018  Bone density: n/a   Allergies  Allergen Reactions  . Betadine [Povidone  Iodine] Rash    Current Outpatient Medications  Medication Sig Dispense Refill  . acetaminophen (TYLENOL) 500 MG tablet Take 1 tablet (500 mg total) by mouth every 6 (six) hours as needed. 60 tablet 0  . amphetamine-dextroamphetamine (ADDERALL XR) 20 MG 24 hr capsule Take 20 mg by mouth daily.    . Ascorbic Acid (VITAMIN C) 1000 MG tablet Take 1,000 mg by mouth daily.    . citalopram (CELEXA) 20 MG tablet Take 20 mg by mouth at bedtime.     . clobetasol cream (TEMOVATE) 6.46 % Apply 1 application topically daily.    Marland Kitchen gabapentin (NEURONTIN) 300 MG capsule Take 1 capsule (300 mg total) by mouth at bedtime. 90 capsule 4  . hydrocortisone (ANUSOL-HC) 25 MG suppository Place 1 suppository (25 mg total) rectally 2 (two) times daily as needed for hemorrhoids or anal itching. 24 suppository 1  . hydrOXYzine (ATARAX/VISTARIL) 10 MG tablet Take 10 mg by mouth at bedtime.     . lidocaine-prilocaine (EMLA) cream Apply to affected area once 30 g 3  . LORazepam (ATIVAN) 0.5 MG tablet Take 1 tablet (0.5 mg total) by mouth at bedtime as needed (Nausea or vomiting). 30 tablet 0  . Multiple Vitamin (MULTIVITAMIN WITH MINERALS) TABS  tablet Take 1 tablet by mouth daily.    . naproxen sodium (ALEVE) 220 MG tablet Take 1 tablet (220 mg total) by mouth 3 (three) times daily with meals. 60 tablet 3  . prochlorperazine (COMPAZINE) 10 MG tablet Take 1 tablet (10 mg total) by mouth every 6 (six) hours as needed (Nausea or vomiting). 30 tablet 1  . Tetrahydrozoline HCl (VISINE OP) Place 1 drop into both eyes daily as needed (redness).    . triamcinolone (KENALOG) 0.025 % ointment Apply 1 application topically 2 (two) times daily. 30 g 0  . vitamin B-12 (CYANOCOBALAMIN) 1000 MCG tablet Take 1,000 mcg by mouth daily.     No current facility-administered medications for this visit.   Facility-Administered Medications Ordered in Other Visits  Medication Dose Route Frequency Provider Last Rate Last Admin  . 0.9 %  sodium  chloride infusion   Intravenous Once Magrinat, Virgie Dad, MD      . dexamethasone (DECADRON) injection 4 mg  4 mg Intravenous Once Magrinat, Virgie Dad, MD      . diphenhydrAMINE (BENADRYL) injection 25 mg  25 mg Intravenous Once Magrinat, Virgie Dad, MD      . famotidine (PEPCID) IVPB 20 mg premix  20 mg Intravenous Once Magrinat, Virgie Dad, MD      . heparin lock flush 100 unit/mL  500 Units Intracatheter Once PRN Magrinat, Virgie Dad, MD      . PACLitaxel (TAXOL) 150 mg in sodium chloride 0.9 % 250 mL chemo infusion (</= '80mg'$ /m2)  80 mg/m2 (Treatment Plan Recorded) Intravenous Once Magrinat, Virgie Dad, MD      . sodium chloride flush (NS) 0.9 % injection 10 mL  10 mL Intracatheter PRN Magrinat, Virgie Dad, MD        OBJECTIVE: Young white woman in no acute distress  Vitals:   04/06/19 0856  BP: 129/84  Pulse: 92  Resp: 17  Temp: 98 F (36.7 C)  SpO2: 100%   Wt Readings from Last 3 Encounters:  04/06/19 188 lb 12.8 oz (85.6 kg)  03/23/19 185 lb 14.4 oz (84.3 kg)  03/09/19 184 lb 1.6 oz (83.5 kg)   Body mass index is 32.41 kg/m.    ECOG FS:1 - Symptomatic but completely ambulatory GENERAL: Patient is a well appearing female in no acute distress HEENT:  Sclerae anicteric.  Oropharynx clear and moist. No ulcerations or evidence of oropharyngeal candidiasis. Neck is supple.  NODES:  No cervical, supraclavicular, or axillary lymphadenopathy palpated.  BREAST EXAM:  Deferred. LUNGS:  Clear to auscultation bilaterally.  No wheezes or rhonchi. HEART:  Regular rate and rhythm. No murmur appreciated. ABDOMEN:  Soft, nontender.  Positive, normoactive bowel sounds. No organomegaly palpated. MSK:  No focal spinal tenderness to palpation. Full range of motion bilaterally in the upper extremities. EXTREMITIES:  No peripheral edema.   SKIN:  Clear with no obvious rashes or skin changes. No nail dyscrasia. NEURO:  Nonfocal. Well oriented.  Appropriate affect.     LAB RESULTS:  CMP     Component  Value Date/Time   NA 141 04/06/2019 0843   K 4.5 04/06/2019 0843   CL 105 04/06/2019 0843   CO2 24 04/06/2019 0843   GLUCOSE 90 04/06/2019 0843   BUN 12 04/06/2019 0843   CREATININE 0.63 04/06/2019 0843   CREATININE 0.68 10/05/2018 1244   CALCIUM 9.0 04/06/2019 0843   PROT 6.8 04/06/2019 0843   ALBUMIN 3.8 04/06/2019 0843   AST 18 04/06/2019 0843   AST 15 10/05/2018  1244   ALT 32 04/06/2019 0843   ALT 18 10/05/2018 1244   ALKPHOS 60 04/06/2019 0843   BILITOT 0.4 04/06/2019 0843   BILITOT 0.5 10/05/2018 1244   GFRNONAA >60 04/06/2019 0843   GFRNONAA >60 10/05/2018 1244   GFRAA >60 04/06/2019 0843   GFRAA >60 10/05/2018 1244    No results found for: TOTALPROTELP, ALBUMINELP, A1GS, A2GS, BETS, BETA2SER, GAMS, MSPIKE, SPEI  No results found for: KPAFRELGTCHN, LAMBDASER, KAPLAMBRATIO  Lab Results  Component Value Date   WBC 4.8 04/06/2019   NEUTROABS 3.0 04/06/2019   HGB 11.1 (L) 04/06/2019   HCT 32.7 (L) 04/06/2019   MCV 98.8 04/06/2019   PLT 351 04/06/2019    No results found for: LABCA2  No components found for: [id number]  No results for input(s): INR in the last 168 hours.  No results found for: LABCA2  No results found for: FTD322  No results found for: GUR427  No results found for: CWC376  No results found for: CA2729  No components found for: HGQUANT  No results found for: CEA1 / No results found for: CEA1   No results found for: AFPTUMOR  No results found for: CHROMOGRNA  No results found for: HGBA, HGBA2QUANT, HGBFQUANT, HGBSQUAN (Hemoglobinopathy evaluation)   No results found for: LDH  No results found for: IRON, TIBC, IRONPCTSAT (Iron and TIBC)  No results found for: FERRITIN  Urinalysis    Component Value Date/Time   BILIRUBINUR Neg 01/18/2012 1458   PROTEINUR Neg 01/18/2012 1458   UROBILINOGEN 0.2 01/18/2012 1458   NITRITE Neg 01/18/2012 1458   LEUKOCYTESUR Negative 01/18/2012 1458     STUDIES: No results  found.   ELIGIBLE FOR AVAILABLE RESEARCH PROTOCOL: no  ASSESSMENT: 39 y.o. [place] woman status post right breast upper inner quadrant biopsy on 09/26/2018 for a clinical T2 N0, stage IB invasive ductal carcinoma, grade 2, estrogen and progesterone receptor positive, HER-2 nonamplified, with an MIB-1-1 of 35%  (1) Genetic testing reported on 10/05/2018 through the [facility] Breast Cancer STAT Panel + Common Hereditary cancer panel found no pathogenic mutations. The STAT Breast cancer panel offered by [facility] includes sequencing and rearrangement analysis for the following 9 genes:  ATM, BRCA1, BRCA2, CDH1, CHEK2, PALB2, PTEN, STK11 and TP53. The Common Hereditary Cancers Panel offered by [facility] includes sequencing and/or deletion duplication testing of the following 47 genes: APC, ATM, AXIN2, BARD1, BMPR1A, BRCA1, BRCA2, BRIP1, CDH1, CDKN2A (p14ARF), CDKN2A (p16INK4a), CKD4, CHEK2, CTNNA1, DICER1, EPCAM (Deletion/duplication testing only), GREM1 (promoter region deletion/duplication testing only), KIT, MEN1, MLH1, MSH2, MSH3, MSH6, MUTYH, NBN, NF1, NHTL1, PALB2, PDGFRA, PMS2, POLD1, POLE, PTEN, RAD50, RAD51C, RAD51D, SDHB, SDHC, SDHD, SMAD4, SMARCA4. STK11, TP53, TSC1, TSC2, and VHL.  The following genes were evaluated for sequence changes only: SDHA and HOXB13 c.251G>A variant only.   (2) MammaPrint obtained from the original breast biopsy returned "high risk" indicating the patient will have an excellent prognosis if she receives adjuvant chemotherapy  (3) status post right lumpectomy and right axillary sentinel lymph node sampling 11/21/2018 for a pT2 pN1, stage IIA invasive ductal carcinoma, grade 2, with negative margins  (a) a total of 4 sentinel lymph nodes removed, 1 with macrometastasis  (b) status post bilateral breast reductions at the time of lumpectomy  (4) adjuvant chemotherapy consisting of cyclophosphamide and doxorubicin in dose dense fashion x4 started 01/05/2019, completed  02/15/2019, followed by weekly paclitaxel x12 started 03/02/2019  (a) echo 10/27/2018 shows an ejection fraction in the 55-60% range  (5) adjuvant radiation to  follow, in Echo per patient's request  (6) tamoxifen started 10/05/2018 in anticipation of surgical delays, discontinued 12/05/2018   PLAN: Joeann continues on weekly Paclitaxel with good tolerance.  She has no peripheral neuropathy and we are monitoring her closely for this. She continues to practice cryotherapy.    Kaira and I talked about her achiness/flashes.  This is likely multi factorial and related to both her vaccine, the chemotherapy, and the fact that she is going into menopause from the chemotherapy.  She is taking the Gabapentin at night time with good tolerance.  Renne and I talked about her weight gain which is normal for chemotherapy.  She and I discussed exercise, and I recommended limiting carbohydrates and that she eat lean meats such as chicken/turkey/fish, and fruits/vegetables.    Nevaeha is receiving her second COVID vaccine on 4/6.  She will not receive treatment on 4/8 and instead will return on 04/20/2019 to resume her treatment.  Nayellie and I discussed her overall plan including her chemotherapy, radiation, tamoxifen, and survivorship/transitioning to life after cancer.    We will see her back on 04/20/2019 for labs, f/u and her next treatment.  She was recommended to continue with the appropriate pandemic precautions. She knows to call for any questions that may arise between now and her next appointment.  We are happy to see her sooner if needed.   Total encounter time 30 minutes.Wilber Bihari, NP 04/06/19 9:34 AM Medical Oncology and Hematology [facility] [place], [place] [address] Tel. [phone]    Fax. [phone]   *Total Encounter Time as defined by the Centers for Medicare and Medicaid Services includes, in addition to the face-to-face time of a patient visit  (documented in the note above) non-face-to-face time: obtaining and reviewing outside history, ordering and reviewing medications, tests or procedures, care coordination (communications with other health care professionals or caregivers) and documentation in the medical record.

## 2019-04-07 ENCOUNTER — Telehealth: Payer: Self-pay | Admitting: Adult Health

## 2019-04-07 NOTE — Telephone Encounter (Signed)
Canceled 4/8 appts per 4/1 los. Left voicemail regarding cancellations.

## 2019-04-08 ENCOUNTER — Ambulatory Visit: Payer: BC Managed Care – PPO

## 2019-04-11 ENCOUNTER — Ambulatory Visit: Payer: BC Managed Care – PPO

## 2019-04-13 ENCOUNTER — Ambulatory Visit: Payer: BC Managed Care – PPO

## 2019-04-13 ENCOUNTER — Other Ambulatory Visit: Payer: BC Managed Care – PPO

## 2019-04-14 NOTE — Progress Notes (Signed)
Pharmacist Chemotherapy Monitoring - Follow Up Assessment    I verify that I have reviewed each item in the below checklist:  . Regimen for the patient is scheduled for the appropriate day and plan matches scheduled date. Marland Kitchen Appropriate non-routine labs are ordered dependent on drug ordered. . If applicable, additional medications reviewed and ordered per protocol based on lifetime cumulative doses and/or treatment regimen.   Plan for follow-up and/or issues identified: No . I-vent associated with next due treatment: No . MD and/or nursing notified:   Larene Beach 04/14/2019 3:54 PM

## 2019-04-19 NOTE — Progress Notes (Signed)
[place]  [phone] Fax:([phone]) [phone]     ID: [name] DOB: March 08, 1980  MR#: [id number]  ZDG#:[id number]  Patient Care Team: Kaylee Hedges, DO as PCP - General (Obstetrics and Gynecology) Kaylee Kaufmann, RN as Oncology Nurse Navigator Kaylee Germany, RN as Oncology Nurse Navigator Kaylee Pray, MD as Consulting Physician (Radiation Oncology) Kaylee Romero, Kaylee Dad, MD as Consulting Physician (Oncology) Kaylee Kussmaul, MD as Consulting Physician (General Surgery) Dillingham, Loel Lofty, DO as Attending Physician (Plastic Surgery) Kaylee Cruel, MD OTHER MD:  CHIEF COMPLAINT: estrogen receptor positive breast cancer  CURRENT TREATMENT: Adjuvant chemotherapy   INTERVAL HISTORY: Kaylee Romero  returns today for follow-up and treatment of her estrogen receptor positive breast cancer.   She continues weekly Paclitaxel.  Today is her sixth week of treatment.  She is tolerating this moderately well and has no peripheral neuropathy.  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  This is likely unrelated.  She had her second vaccine dose.  She felt a little bit tired and she had a couple of chill episodes with sweats but those have resolved.  On the other hand she has not had any.  "For a long time" and she is having hot flashes several times a day.   REVIEW OF SYSTEMS: Kaylee Romero is not working at present.  She is home with her daughter and that keeps her busy.  She is on a low-carb diet and has already lost several pounds which she is very happy about.  A detailed review of systems today was otherwise stable.   HISTORY OF CURRENT ILLNESS: From the original intake note:  Kaylee Romero (pronounced "Bram-Hall") found a palpable lump in the 12 o'clock position of the right breast, which he brought to the attention of her gynecologist.. She underwent bilateral diagnostic mammography with tomography and  bilateral breast ultrasonography at [facility] on 09/23/2018 showing: breast density category C; a spiculated mass with associated with architectural distortion and pleomorphic calcifications lying in the 11-12 o'clock position, middle depth, measuring 2 cm in size, corresponding to the palpable abnormality.   Physical exam confirmed a firm but mobile mass in the 12 o'clock position of the right breast.  By ultrasound there was a highly suspicious 2.1 cm mass in the 12 o'clock position of the right breast; no other evidence of breast malignancy or lymphadenopathy bilaterally.Sonographic evaluation of the right axilla found no enlarged or abnormal appearing lymph node  In the left axilla there were several lymph nodes with areas of cortical prominence similar to the lymph node noted on the right.  Given the symmetry all these are presumed to be reactive  Accordingly on 09/26/2018 she proceeded to biopsy of the right breast area in question. The pathology from this procedure ([id number]) showed: invasive ductal carcinoma, grade 2; prognostic indicators significant for: estrogen receptor, 80% positive with moderate staining intensity and progesterone receptor, 90% positive with strong staining intensity. Proliferation marker Ki67 at 35%. HER2 negative by immunohistochemistry (1+).  The patient's subsequent history is as detailed below.   PAST MEDICAL HISTORY: Past Medical History:  Diagnosis Date  . Anxiety   . Family history of breast cancer   . HSV (herpes simplex virus) anogenital infection    positive titer only    PAST SURGICAL HISTORY: Past Surgical History:  Procedure Laterality Date  . BREAST LUMPECTOMY WITH AXILLARY LYMPH NODE BIOPSY Right 11/21/2018   Procedure: RIGHT BREAST LUMPECTOMY  WITH SENTINEL LYMPH NODE BIOPSY;  Surgeon: Kaylee Kussmaul, MD;  Location: [facility];  Service: General;  Laterality: Right;  . BREAST REDUCTION SURGERY Bilateral 11/21/2018   Procedure: BILATERAL  MAMMARY REDUCTION  (BREAST);  Surgeon: Wallace Going, DO;  Location: [facility];  Service: Plastics;  Laterality: Bilateral;  . PORTACATH PLACEMENT N/A 11/21/2018   Procedure: INSERTION LEFT PORT-A-CATH WITH ULTRASOUND GUIDANCE;  Surgeon: Kaylee Kussmaul, MD;  Location: [facility];  Service: General;  Laterality: N/A;  . TONSILLECTOMY  1987  . TONSILLECTOMY    . WISDOM TOOTH EXTRACTION      FAMILY HISTORY: Family History  Problem Relation Age of Onset  . Hypertension Father   . Alcohol abuse Paternal Grandfather   . Heart disease Paternal Grandfather   . Alcohol abuse Maternal Grandfather   . Heart disease Maternal Grandmother   . Breast cancer Other    Patient's parents are both living at age 59, as of 09/2018. Her father lives in [place], and her mother lives in [place], [place]. The patient denies a family hx of ovarian cancer. A maternal great aunt was diagnosed with breast cancer twice. She has 2 brothers, no sisters   GYNECOLOGIC HISTORY:  No LMP recorded. (Menstrual status: Chemotherapy). Menarche: 39 years old Age at first live birth: 39 years old [place] P 1 Currently having regular periods  Contraceptive: Mirena IUD in place HRT n/a  Hysterectomy? no BSO? no   SOCIAL HISTORY: (updated January 2021) Kaylee Romero is a [employer]. Husband Kaylee Romero is in management with [employer]. [name] is 74 months old.     ADVANCED DIRECTIVES: not in place. Her husband is automatically her HCPOA.   HEALTH MAINTENANCE: Social History   Tobacco Use  . Smoking status: Former Smoker    Packs/day: 0.50    Years: 10.00    Pack years: 5.00    Types: Cigarettes    Start date: 01/13/2002    Quit date: 10/17/2016    Years since quitting: 2.5  . Smokeless tobacco: Never Used  Substance Use Topics  . Alcohol use: Yes    Comment: Socially  . Drug use: No     Colonoscopy: n/a  PAP: 09/20/2018  Bone density: n/a   Allergies  Allergen Reactions  . Betadine [Povidone Iodine] Rash     Current Outpatient Medications  Medication Sig Dispense Refill  . acetaminophen (TYLENOL) 500 MG tablet Take 1 tablet (500 mg total) by mouth every 6 (six) hours as needed. 60 tablet 0  . amphetamine-dextroamphetamine (ADDERALL XR) 20 MG 24 hr capsule Take 20 mg by mouth daily.    . Ascorbic Acid (VITAMIN C) 1000 MG tablet Take 1,000 mg by mouth daily.    . citalopram (CELEXA) 20 MG tablet Take 20 mg by mouth at bedtime.     . clobetasol cream (TEMOVATE) 2.44 % Apply 1 application topically daily.    Marland Kitchen gabapentin (NEURONTIN) 300 MG capsule Take 1 capsule (300 mg total) by mouth at bedtime. 90 capsule 4  . hydrocortisone (ANUSOL-HC) 25 MG suppository Place 1 suppository (25 mg total) rectally 2 (two) times daily as needed for hemorrhoids or anal itching. 24 suppository 1  . hydrOXYzine (ATARAX/VISTARIL) 10 MG tablet Take 10 mg by mouth at bedtime.     . lidocaine-prilocaine (EMLA) cream Apply to affected area once 30 g 3  . LORazepam (ATIVAN) 0.5 MG tablet Take 1 tablet (0.5 mg total) by mouth at bedtime as needed (Nausea or vomiting). 30 tablet 0  .  Multiple Vitamin (MULTIVITAMIN WITH MINERALS) TABS tablet Take 1 tablet by mouth daily.    . naproxen sodium (ALEVE) 220 MG tablet Take 1 tablet (220 mg total) by mouth 3 (three) times daily with meals. 60 tablet 3  . prochlorperazine (COMPAZINE) 10 MG tablet Take 1 tablet (10 mg total) by mouth every 6 (six) hours as needed (Nausea or vomiting). 30 tablet 1  . Tetrahydrozoline HCl (VISINE OP) Place 1 drop into both eyes daily as needed (redness).    . triamcinolone (KENALOG) 0.025 % ointment Apply 1 application topically 2 (two) times daily. 30 g 0  . vitamin B-12 (CYANOCOBALAMIN) 1000 MCG tablet Take 1,000 mcg by mouth daily.     No current facility-administered medications for this visit.    OBJECTIVE: white woman who did not undress for breast exam today  Vitals:   04/20/19 0919  BP: 131/77  Pulse: 86  Resp: 18  Temp: 98.2 F (36.8  C)  SpO2: 100%   Wt Readings from Last 3 Encounters:  04/20/19 184 lb (83.5 kg)  04/06/19 188 lb 12.8 oz (85.6 kg)  03/23/19 185 lb 14.4 oz (84.3 kg)   Body mass index is 31.58 kg/m.    ECOG FS:1 - Symptomatic but completely ambulatory  Sclerae unicteric, EOMs intact Wearing a mask No cervical or supraclavicular adenopathy Lungs no rales or rhonchi Heart regular rate and rhythm Abd soft, nontender, positive bowel sounds MSK no focal spinal tenderness, no upper extremity lymphedema Neuro: nonfocal, well oriented, appropriate affect Breasts: Deferred   LAB RESULTS:  CMP     Component Value Date/Time   NA 141 04/06/2019 0843   K 4.5 04/06/2019 0843   CL 105 04/06/2019 0843   CO2 24 04/06/2019 0843   GLUCOSE 90 04/06/2019 0843   BUN 12 04/06/2019 0843   CREATININE 0.63 04/06/2019 0843   CREATININE 0.68 10/05/2018 1244   CALCIUM 9.0 04/06/2019 0843   PROT 6.8 04/06/2019 0843   ALBUMIN 3.8 04/06/2019 0843   AST 18 04/06/2019 0843   AST 15 10/05/2018 1244   ALT 32 04/06/2019 0843   ALT 18 10/05/2018 1244   ALKPHOS 60 04/06/2019 0843   BILITOT 0.4 04/06/2019 0843   BILITOT 0.5 10/05/2018 1244   GFRNONAA >60 04/06/2019 0843   GFRNONAA >60 10/05/2018 1244   GFRAA >60 04/06/2019 0843   GFRAA >60 10/05/2018 1244    No results found for: TOTALPROTELP, ALBUMINELP, A1GS, A2GS, BETS, BETA2SER, GAMS, MSPIKE, SPEI  No results found for: KPAFRELGTCHN, LAMBDASER, KAPLAMBRATIO  Lab Results  Component Value Date   WBC 5.1 04/20/2019   NEUTROABS 3.2 04/20/2019   HGB 12.9 04/20/2019   HCT 37.8 04/20/2019   MCV 97.4 04/20/2019   PLT 303 04/20/2019    No results found for: LABCA2  No components found for: ZDGUYQ034  No results for input(s): INR in the last 168 hours.  No results found for: LABCA2  No results found for: VQQ595  No results found for: GLO756  No results found for: EPP295  No results found for: CA2729  No components found for: HGQUANT  No  results found for: CEA1 / No results found for: CEA1   No results found for: AFPTUMOR  No results found for: CHROMOGRNA  No results found for: HGBA, HGBA2QUANT, HGBFQUANT, HGBSQUAN (Hemoglobinopathy evaluation)   No results found for: LDH  No results found for: IRON, TIBC, IRONPCTSAT (Iron and TIBC)  No results found for: FERRITIN  Urinalysis    Component Value Date/Time   BILIRUBINUR  Neg 01/18/2012 1458   PROTEINUR Neg 01/18/2012 1458   UROBILINOGEN 0.2 01/18/2012 1458   NITRITE Neg 01/18/2012 1458   LEUKOCYTESUR Negative 01/18/2012 1458     STUDIES: No results found.   ELIGIBLE FOR AVAILABLE RESEARCH PROTOCOL: no  ASSESSMENT: 39 y.o. Kaylee Romero woman status post right breast upper inner quadrant biopsy on 09/26/2018 for a clinical T2 N0, stage IB invasive ductal carcinoma, grade 2, estrogen and progesterone receptor positive, HER-2 nonamplified, with an MIB-1-1 of 35%  (1) Genetic testing reported on 10/05/2018 through the [facility] Breast Cancer STAT Panel + Common Hereditary cancer panel found no pathogenic mutations. The STAT Breast cancer panel offered by [facility] includes sequencing and rearrangement analysis for the following 9 genes:  ATM, BRCA1, BRCA2, CDH1, CHEK2, PALB2, PTEN, STK11 and TP53. The Common Hereditary Cancers Panel offered by [facility] includes sequencing and/or deletion duplication testing of the following 47 genes: APC, ATM, AXIN2, BARD1, BMPR1A, BRCA1, BRCA2, BRIP1, CDH1, CDKN2A (p14ARF), CDKN2A (p16INK4a), CKD4, CHEK2, CTNNA1, DICER1, EPCAM (Deletion/duplication testing only), GREM1 (promoter region deletion/duplication testing only), KIT, MEN1, MLH1, MSH2, MSH3, MSH6, MUTYH, NBN, NF1, NHTL1, PALB2, PDGFRA, PMS2, POLD1, POLE, PTEN, RAD50, RAD51C, RAD51D, SDHB, SDHC, SDHD, SMAD4, SMARCA4. STK11, TP53, TSC1, TSC2, and VHL.  The following genes were evaluated for sequence changes only: SDHA and HOXB13 c.251G>A variant only.   (2) MammaPrint obtained from the  original breast biopsy returned "high risk" indicating the patient will have an excellent prognosis if she receives adjuvant chemotherapy  (3) status post right lumpectomy and right axillary sentinel lymph node sampling 11/21/2018 for a pT2 pN1, stage IIA invasive ductal carcinoma, grade 2, with negative margins  (a) a total of 4 sentinel lymph nodes removed, 1 with macrometastasis  (b) status post bilateral breast reductions at the time of lumpectomy  (4) adjuvant chemotherapy consisting of cyclophosphamide and doxorubicin in dose dense fashion x4 started 01/05/2019, completed 02/15/2019, followed by weekly paclitaxel x12 started 03/02/2019  (a) echo 10/27/2018 shows an ejection fraction in the 55-60% range  (5) adjuvant radiation to follow, in [place] per patient's request  (6) tamoxifen started 10/05/2018 in anticipation of surgical delays, discontinued 12/05/2018   PLAN: Kaylee Romero is tolerating the paclitaxel well.  She did not really feel any different with the off week which we gave her so she could make antibodies for her second Covid vaccine dose.  That means that she is generally tolerating things well.  I am delighted that her new low-carb diet is working for her.  Her goal would be to lose approximately 30 pounds and if she continues there is no reason she should not achieve that.  She is also doing some exercise, walking 3-4 times a week.  She is very active with her daughter at home now that she is not working.  She is going to be on a weekend trip starting April 29 and so we are moving that treatment only back 1 day to April 28.  I have entered those orders.  She knows to call for any other issue that may develop before the next visit  Total encounter time 25 minutes.Sarajane Jews C. Cuca Benassi, MD 04/20/19 9:34 AM Medical Oncology and Hematology [facility] [place], [place] [address] Tel. [phone]    Fax. [phone]   I, Wilburn Mylar, am  acting as scribe for Dr. Virgie Romero. Paisly F[place].  I, Lurline Del MD, have reviewed the above documentation for accuracy and completeness, and I agree with the above.    *  Total Encounter Time as defined by the Centers for Medicare and Medicaid Services includes, in addition to the face-to-face time of a patient visit (documented in the note above) non-face-to-face time: obtaining and reviewing outside history, ordering and reviewing medications, tests or procedures, care coordination (communications with other health care professionals or caregivers) and documentation in the medical record.

## 2019-04-20 ENCOUNTER — Encounter: Payer: Self-pay | Admitting: Oncology

## 2019-04-20 ENCOUNTER — Encounter: Payer: Self-pay | Admitting: *Deleted

## 2019-04-20 ENCOUNTER — Other Ambulatory Visit: Payer: Self-pay

## 2019-04-20 ENCOUNTER — Inpatient Hospital Stay: Payer: BC Managed Care – PPO

## 2019-04-20 ENCOUNTER — Inpatient Hospital Stay: Payer: BC Managed Care – PPO | Admitting: Oncology

## 2019-04-20 VITALS — BP 131/77 | HR 86 | Temp 98.2°F | Resp 18 | Ht 64.0 in | Wt 184.0 lb

## 2019-04-20 DIAGNOSIS — Z170 Estrogen receptor positive status [ER+]: Secondary | ICD-10-CM

## 2019-04-20 DIAGNOSIS — C50411 Malignant neoplasm of upper-outer quadrant of right female breast: Secondary | ICD-10-CM

## 2019-04-20 DIAGNOSIS — Z5111 Encounter for antineoplastic chemotherapy: Secondary | ICD-10-CM | POA: Diagnosis not present

## 2019-04-20 DIAGNOSIS — Z95828 Presence of other vascular implants and grafts: Secondary | ICD-10-CM

## 2019-04-20 LAB — CBC WITH DIFFERENTIAL/PLATELET
Abs Immature Granulocytes: 0.03 10*3/uL (ref 0.00–0.07)
Basophils Absolute: 0.1 10*3/uL (ref 0.0–0.1)
Basophils Relative: 1 %
Eosinophils Absolute: 0.2 10*3/uL (ref 0.0–0.5)
Eosinophils Relative: 3 %
HCT: 37.8 % (ref 36.0–46.0)
Hemoglobin: 12.9 g/dL (ref 12.0–15.0)
Immature Granulocytes: 1 %
Lymphocytes Relative: 24 %
Lymphs Abs: 1.2 10*3/uL (ref 0.7–4.0)
MCH: 33.2 pg (ref 26.0–34.0)
MCHC: 34.1 g/dL (ref 30.0–36.0)
MCV: 97.4 fL (ref 80.0–100.0)
Monocytes Absolute: 0.4 10*3/uL (ref 0.1–1.0)
Monocytes Relative: 8 %
Neutro Abs: 3.2 10*3/uL (ref 1.7–7.7)
Neutrophils Relative %: 63 %
Platelets: 303 10*3/uL (ref 150–400)
RBC: 3.88 MIL/uL (ref 3.87–5.11)
RDW: 13.2 % (ref 11.5–15.5)
WBC: 5.1 10*3/uL (ref 4.0–10.5)
nRBC: 0 % (ref 0.0–0.2)

## 2019-04-20 LAB — COMPREHENSIVE METABOLIC PANEL
ALT: 70 U/L — ABNORMAL HIGH (ref 0–44)
AST: 39 U/L (ref 15–41)
Albumin: 4.2 g/dL (ref 3.5–5.0)
Alkaline Phosphatase: 61 U/L (ref 38–126)
Anion gap: 10 (ref 5–15)
BUN: 17 mg/dL (ref 6–20)
CO2: 27 mmol/L (ref 22–32)
Calcium: 9.6 mg/dL (ref 8.9–10.3)
Chloride: 104 mmol/L (ref 98–111)
Creatinine, Ser: 0.67 mg/dL (ref 0.44–1.00)
GFR calc Af Amer: >60 mL/min (ref >60)
GFR calc non Af Amer: >60 mL/min (ref >60)
Glucose, Bld: 88 mg/dL (ref 70–99)
Potassium: 4.3 mmol/L (ref 3.5–5.1)
Sodium: 141 mmol/L (ref 135–145)
Total Bilirubin: 0.4 mg/dL (ref 0.3–1.2)
Total Protein: 7.3 g/dL (ref 6.5–8.1)

## 2019-04-20 MED ORDER — HEPARIN SOD (PORK) LOCK FLUSH 100 UNIT/ML IV SOLN
500.0000 [IU] | Freq: Once | INTRAVENOUS | Status: AC | PRN
  Administered 2019-04-20: 500 [IU]
  Filled 2019-04-20: qty 5

## 2019-04-20 MED ORDER — SODIUM CHLORIDE 0.9 % IV SOLN
80.0000 mg/m2 | Freq: Once | INTRAVENOUS | Status: AC
  Administered 2019-04-20: 150 mg via INTRAVENOUS
  Filled 2019-04-20: qty 25

## 2019-04-20 MED ORDER — SODIUM CHLORIDE 0.9% FLUSH
10.0000 mL | INTRAVENOUS | Status: DC | PRN
  Administered 2019-04-20: 10 mL
  Filled 2019-04-20: qty 10

## 2019-04-20 MED ORDER — DIPHENHYDRAMINE HCL 50 MG/ML IJ SOLN
25.0000 mg | Freq: Once | INTRAMUSCULAR | Status: AC
  Administered 2019-04-20: 25 mg via INTRAVENOUS

## 2019-04-20 MED ORDER — SODIUM CHLORIDE 0.9% FLUSH
10.0000 mL | Freq: Once | INTRAVENOUS | Status: AC
  Administered 2019-04-20: 10 mL
  Filled 2019-04-20: qty 10

## 2019-04-20 MED ORDER — SODIUM CHLORIDE 0.9 % IV SOLN
Freq: Once | INTRAVENOUS | Status: AC
  Filled 2019-04-20: qty 250

## 2019-04-20 MED ORDER — DEXAMETHASONE SODIUM PHOSPHATE 10 MG/ML IJ SOLN
4.0000 mg | Freq: Once | INTRAMUSCULAR | Status: AC
  Administered 2019-04-20: 4 mg via INTRAVENOUS

## 2019-04-20 MED ORDER — FAMOTIDINE IN NACL 20-0.9 MG/50ML-% IV SOLN
INTRAVENOUS | Status: AC
  Filled 2019-04-20: qty 50

## 2019-04-20 MED ORDER — FAMOTIDINE IN NACL 20-0.9 MG/50ML-% IV SOLN
20.0000 mg | Freq: Once | INTRAVENOUS | Status: AC
  Administered 2019-04-20: 20 mg via INTRAVENOUS

## 2019-04-20 MED ORDER — DIPHENHYDRAMINE HCL 50 MG/ML IJ SOLN
INTRAMUSCULAR | Status: AC
  Filled 2019-04-20: qty 1

## 2019-04-20 MED ORDER — DEXAMETHASONE SODIUM PHOSPHATE 10 MG/ML IJ SOLN
INTRAMUSCULAR | Status: AC
  Filled 2019-04-20: qty 1

## 2019-04-20 NOTE — Progress Notes (Signed)
Went to infusion to meet with patient whom received forms from [employer] and had questions.  Patient states she submitted a claim and all prescription for a wig. Advised patient to contact her insurance company to find out exactly what is needed from provider. This should be given to provider's RN when received if additional information is needed from provider.  Also advised patient again about the one-time $1000 [employer] and provided income guidelines if interested in applying to assist with personal household expenses while going through treatment. Discussed available copay assistance if needed for treatment drugs if ded/OOP have not been met. Patient states she will check with her insurance company when she calls them if she has met or not.  She had questions regarding payment plan. Advised her this is handled through the billing/patient accounting department. She states she has received bills which has the number to call with billing questions or concerns.    She has my card for any additional financial questions or concerns.

## 2019-04-20 NOTE — Patient Instructions (Signed)
[facility] Discharge Instructions for Patients Receiving Chemotherapy  Today you received the following chemotherapy agents: paclitaxel.  To help prevent nausea and vomiting after your treatment, we encourage you to take your nausea medication as directed.   If you develop nausea and vomiting that is not controlled by your nausea medication, call the clinic.   BELOW ARE SYMPTOMS THAT SHOULD BE REPORTED IMMEDIATELY:  *FEVER GREATER THAN 100.5 F  *CHILLS WITH OR WITHOUT FEVER  NAUSEA AND VOMITING THAT IS NOT CONTROLLED WITH YOUR NAUSEA MEDICATION  *UNUSUAL SHORTNESS OF BREATH  *UNUSUAL BRUISING OR BLEEDING  TENDERNESS IN MOUTH AND THROAT WITH OR WITHOUT PRESENCE OF ULCERS  *URINARY PROBLEMS  *BOWEL PROBLEMS  UNUSUAL RASH Items with * indicate a potential emergency and should be followed up as soon as possible.  Feel free to call the clinic should you have any questions or concerns. The clinic phone number is [phone].  Please show the CHEMO ALERT CARD at check-in to the Emergency Department and triage nurse.   

## 2019-04-20 NOTE — Patient Instructions (Signed)

## 2019-04-21 NOTE — Progress Notes (Signed)
Pharmacist Chemotherapy Monitoring - Follow Up Assessment    I verify that I have reviewed each item in the below checklist:  . Regimen for the patient is scheduled for the appropriate day and plan matches scheduled date. Marland Kitchen Appropriate non-routine labs are ordered dependent on drug ordered. . If applicable, additional medications reviewed and ordered per protocol based on lifetime cumulative doses and/or treatment regimen.   Plan for follow-up and/or issues identified: No . I-vent associated with next due treatment: No . MD and/or nursing notified: No  Kaylee Romero 04/21/2019 10:03 AM

## 2019-04-24 ENCOUNTER — Telehealth: Payer: Self-pay | Admitting: Oncology

## 2019-04-24 NOTE — Telephone Encounter (Signed)
Scheduled appts per 4/15 los. Pt confirmed appt date and time.

## 2019-04-25 ENCOUNTER — Other Ambulatory Visit: Payer: Self-pay | Admitting: Oncology

## 2019-04-25 DIAGNOSIS — Z170 Estrogen receptor positive status [ER+]: Secondary | ICD-10-CM

## 2019-04-25 DIAGNOSIS — C50411 Malignant neoplasm of upper-outer quadrant of right female breast: Secondary | ICD-10-CM

## 2019-04-26 ENCOUNTER — Other Ambulatory Visit: Payer: Self-pay

## 2019-04-26 NOTE — Telephone Encounter (Signed)
ERROR

## 2019-04-27 ENCOUNTER — Inpatient Hospital Stay: Payer: BC Managed Care – PPO

## 2019-04-27 ENCOUNTER — Encounter: Payer: Self-pay | Admitting: Adult Health

## 2019-04-27 ENCOUNTER — Other Ambulatory Visit: Payer: Self-pay

## 2019-04-27 ENCOUNTER — Inpatient Hospital Stay (HOSPITAL_BASED_OUTPATIENT_CLINIC_OR_DEPARTMENT_OTHER): Payer: BC Managed Care – PPO | Admitting: Adult Health

## 2019-04-27 VITALS — HR 96

## 2019-04-27 VITALS — BP 127/85 | HR 109 | Temp 98.2°F | Resp 20 | Ht 64.0 in | Wt 182.4 lb

## 2019-04-27 DIAGNOSIS — Z170 Estrogen receptor positive status [ER+]: Secondary | ICD-10-CM | POA: Diagnosis not present

## 2019-04-27 DIAGNOSIS — Z5111 Encounter for antineoplastic chemotherapy: Secondary | ICD-10-CM | POA: Diagnosis not present

## 2019-04-27 DIAGNOSIS — C50411 Malignant neoplasm of upper-outer quadrant of right female breast: Secondary | ICD-10-CM

## 2019-04-27 DIAGNOSIS — Z95828 Presence of other vascular implants and grafts: Secondary | ICD-10-CM

## 2019-04-27 LAB — CBC WITH DIFFERENTIAL/PLATELET
Abs Immature Granulocytes: 0.02 10*3/uL (ref 0.00–0.07)
Basophils Absolute: 0 10*3/uL (ref 0.0–0.1)
Basophils Relative: 1 %
Eosinophils Absolute: 0.3 10*3/uL (ref 0.0–0.5)
Eosinophils Relative: 6 %
HCT: 38.2 % (ref 36.0–46.0)
Hemoglobin: 12.9 g/dL (ref 12.0–15.0)
Immature Granulocytes: 1 %
Lymphocytes Relative: 20 %
Lymphs Abs: 0.9 10*3/uL (ref 0.7–4.0)
MCH: 33.1 pg (ref 26.0–34.0)
MCHC: 33.8 g/dL (ref 30.0–36.0)
MCV: 97.9 fL (ref 80.0–100.0)
Monocytes Absolute: 0.4 10*3/uL (ref 0.1–1.0)
Monocytes Relative: 9 %
Neutro Abs: 2.7 10*3/uL (ref 1.7–7.7)
Neutrophils Relative %: 63 %
Platelets: 311 10*3/uL (ref 150–400)
RBC: 3.9 MIL/uL (ref 3.87–5.11)
RDW: 12.8 % (ref 11.5–15.5)
WBC: 4.2 10*3/uL (ref 4.0–10.5)
nRBC: 0 % (ref 0.0–0.2)

## 2019-04-27 LAB — COMPREHENSIVE METABOLIC PANEL
ALT: 56 U/L — ABNORMAL HIGH (ref 0–44)
AST: 38 U/L (ref 15–41)
Albumin: 4 g/dL (ref 3.5–5.0)
Alkaline Phosphatase: 59 U/L (ref 38–126)
Anion gap: 9 (ref 5–15)
BUN: 13 mg/dL (ref 6–20)
CO2: 26 mmol/L (ref 22–32)
Calcium: 9.8 mg/dL (ref 8.9–10.3)
Chloride: 103 mmol/L (ref 98–111)
Creatinine, Ser: 0.68 mg/dL (ref 0.44–1.00)
GFR calc Af Amer: >60 mL/min (ref >60)
GFR calc non Af Amer: >60 mL/min (ref >60)
Glucose, Bld: 106 mg/dL — ABNORMAL HIGH (ref 70–99)
Potassium: 3.9 mmol/L (ref 3.5–5.1)
Sodium: 138 mmol/L (ref 135–145)
Total Bilirubin: 0.4 mg/dL (ref 0.3–1.2)
Total Protein: 7.4 g/dL (ref 6.5–8.1)

## 2019-04-27 MED ORDER — FAMOTIDINE IN NACL 20-0.9 MG/50ML-% IV SOLN
INTRAVENOUS | Status: AC
  Filled 2019-04-27: qty 50

## 2019-04-27 MED ORDER — DIPHENHYDRAMINE HCL 50 MG/ML IJ SOLN
25.0000 mg | Freq: Once | INTRAMUSCULAR | Status: AC
  Administered 2019-04-27: 25 mg via INTRAVENOUS

## 2019-04-27 MED ORDER — DEXAMETHASONE SODIUM PHOSPHATE 10 MG/ML IJ SOLN
4.0000 mg | Freq: Once | INTRAMUSCULAR | Status: AC
  Administered 2019-04-27: 4 mg via INTRAVENOUS

## 2019-04-27 MED ORDER — SODIUM CHLORIDE 0.9% FLUSH
10.0000 mL | Freq: Once | INTRAVENOUS | Status: AC
  Administered 2019-04-27: 10 mL
  Filled 2019-04-27: qty 10

## 2019-04-27 MED ORDER — LORAZEPAM 0.5 MG PO TABS: 0.5000 mg | ORAL_TABLET | Freq: Every evening | ORAL | 0 refills | Status: DC | PRN

## 2019-04-27 MED ORDER — SODIUM CHLORIDE 0.9 % IV SOLN
Freq: Once | INTRAVENOUS | Status: AC
  Filled 2019-04-27: qty 250

## 2019-04-27 MED ORDER — DEXAMETHASONE SODIUM PHOSPHATE 10 MG/ML IJ SOLN
INTRAMUSCULAR | Status: AC
  Filled 2019-04-27: qty 1

## 2019-04-27 MED ORDER — FAMOTIDINE IN NACL 20-0.9 MG/50ML-% IV SOLN
20.0000 mg | Freq: Once | INTRAVENOUS | Status: AC
  Administered 2019-04-27: 20 mg via INTRAVENOUS

## 2019-04-27 MED ORDER — VENLAFAXINE HCL ER 37.5 MG PO CP24: 37.5000 mg | ORAL_CAPSULE | Freq: Every day | ORAL | 0 refills | Status: DC

## 2019-04-27 MED ORDER — DIPHENHYDRAMINE HCL 50 MG/ML IJ SOLN
INTRAMUSCULAR | Status: AC
  Filled 2019-04-27: qty 1

## 2019-04-27 MED ORDER — HEPARIN SOD (PORK) LOCK FLUSH 100 UNIT/ML IV SOLN
500.0000 [IU] | Freq: Once | INTRAVENOUS | Status: AC | PRN
  Administered 2019-04-27: 500 [IU]
  Filled 2019-04-27: qty 5

## 2019-04-27 MED ORDER — SODIUM CHLORIDE 0.9 % IV SOLN
80.0000 mg/m2 | Freq: Once | INTRAVENOUS | Status: AC
  Administered 2019-04-27: 150 mg via INTRAVENOUS
  Filled 2019-04-27: qty 25

## 2019-04-27 MED ORDER — SODIUM CHLORIDE 0.9% FLUSH
10.0000 mL | INTRAVENOUS | Status: DC | PRN
  Administered 2019-04-27: 10 mL
  Filled 2019-04-27: qty 10

## 2019-04-27 NOTE — Progress Notes (Signed)
Pharmacist Chemotherapy Monitoring - Follow Up Assessment    I verify that I have reviewed each item in the below checklist:  . Regimen for the patient is scheduled for the appropriate day and plan matches scheduled date. Marland Kitchen Appropriate non-routine labs are ordered dependent on drug ordered. . If applicable, additional medications reviewed and ordered per protocol based on lifetime cumulative doses and/or treatment regimen.   Plan for follow-up and/or issues identified: No . I-vent associated with next due treatment: No . MD and/or nursing notified: No  Kaylee Romero 04/27/2019 11:17 AM

## 2019-04-27 NOTE — Patient Instructions (Signed)
[facility] Discharge Instructions for Patients Receiving Chemotherapy  Today you received the following chemotherapy agents:  Taxol.  To help prevent nausea and vomiting after your treatment, we encourage you to take your nausea medication as directed.   If you develop nausea and vomiting that is not controlled by your nausea medication, call the clinic.   BELOW ARE SYMPTOMS THAT SHOULD BE REPORTED IMMEDIATELY:  *FEVER GREATER THAN 100.5 F  *CHILLS WITH OR WITHOUT FEVER  NAUSEA AND VOMITING THAT IS NOT CONTROLLED WITH YOUR NAUSEA MEDICATION  *UNUSUAL SHORTNESS OF BREATH  *UNUSUAL BRUISING OR BLEEDING  TENDERNESS IN MOUTH AND THROAT WITH OR WITHOUT PRESENCE OF ULCERS  *URINARY PROBLEMS  *BOWEL PROBLEMS  UNUSUAL RASH Items with * indicate a potential emergency and should be followed up as soon as possible.  Feel free to call the clinic should you have any questions or concerns. The clinic phone number is [phone].  Please show the CHEMO ALERT CARD at check-in to the Emergency Department and triage nurse.   

## 2019-04-27 NOTE — Progress Notes (Signed)
[place]  Telephone:([phone] Fax:([phone]) [phone]     ID: [name] DOB: 03/06/1980  MR#: [id number]  MVE#:[id number]  Patient Care Team: Linda Hedges, DO as PCP - General (Obstetrics and Gynecology) Mauro Kaufmann, RN as Oncology Nurse Navigator Rockwell Germany, RN as Oncology Nurse Navigator Gery Pray, MD as Consulting Physician (Radiation Oncology) Magrinat, Virgie Dad, MD as Consulting Physician (Oncology) Jovita Kussmaul, MD as Consulting Physician (General Surgery) Dillingham, Loel Lofty, DO as Attending Physician (Plastic Surgery) Scot Dock, NP OTHER MD:  CHIEF COMPLAINT: estrogen receptor positive breast cancer  CURRENT TREATMENT: Adjuvant chemotherapy   INTERVAL HISTORY: [name]  returns today for follow-up and treatment of her estrogen receptor positive breast cancer.   She continues weekly Paclitaxel.  Today is week 7 of treatment.    REVIEW OF SYSTEMS: [name] is beginning to struggle with the chemotherapy.  She is fatigued, and is having episodes of hot flashes, and feeling tired.  She feels overwhelmed, and that she has a lower quality of life.  She is tearful about all of these changes.  She ntor hands are very achy.  She has developed a mild intermittent numbness and tingling in her fingertips.  She denies any motor changes.  She also notes her breast is sore.  She denies any breast changes, but notes she is concerned about recurrence and is tearful about this.    [name] is continuing on a low carbohydrate diet.  This is working for her because she will have a protein shake or bar, and then meat and vegetables.  She is down 6 pounds from 04/06/2019.    [name] has had no fever, cough, shortness of breath, chest pain, palpitations, bowel/bladder changes, headaches, vision issues or any other concerns.  A detailed ROS was otherwise non contributory.     HISTORY OF CURRENT ILLNESS: From the original intake note:  Kaylee Romero (pronounced  "Bram-Hall") found a palpable lump in the 12 o'clock position of the right breast, which he brought to the attention of her gynecologist.. She underwent bilateral diagnostic mammography with tomography and bilateral breast ultrasonography at [facility] on 09/23/2018 showing: breast density category C; a spiculated mass with associated with architectural distortion and pleomorphic calcifications lying in the 11-12 o'clock position, middle depth, measuring 2 cm in size, corresponding to the palpable abnormality.   Physical exam confirmed a firm but mobile mass in the 12 o'clock position of the right breast.  By ultrasound there was a highly suspicious 2.1 cm mass in the 12 o'clock position of the right breast; no other evidence of breast malignancy or lymphadenopathy bilaterally.Sonographic evaluation of the right axilla found no enlarged or abnormal appearing lymph node  In the left axilla there were several lymph nodes with areas of cortical prominence similar to the lymph node noted on the right.  Given the symmetry all these are presumed to be reactive  Accordingly on 09/26/2018 she proceeded to biopsy of the right breast area in question. The pathology from this procedure ([id number]) showed: invasive ductal carcinoma, grade 2; prognostic indicators significant for: estrogen receptor, 80% positive with moderate staining intensity and progesterone receptor, 90% positive with strong staining intensity. Proliferation marker Ki67 at 35%. HER2 negative by immunohistochemistry (1+).  The patient's subsequent history is as detailed below.   PAST MEDICAL HISTORY: Past Medical History:  Diagnosis Date  . Anxiety   . Family history of breast cancer   . HSV (herpes simplex virus) anogenital infection  positive titer only    PAST SURGICAL HISTORY: Past Surgical History:  Procedure Laterality Date  . BREAST LUMPECTOMY WITH AXILLARY LYMPH NODE BIOPSY Right 11/21/2018   Procedure: RIGHT BREAST  LUMPECTOMY WITH SENTINEL LYMPH NODE BIOPSY;  Surgeon: Jovita Kussmaul, MD;  Location: [place];  Service: General;  Laterality: Right;  . BREAST REDUCTION SURGERY Bilateral 11/21/2018   Procedure: BILATERAL MAMMARY REDUCTION  (BREAST);  Surgeon: Wallace Going, DO;  Location: [place];  Service: Plastics;  Laterality: Bilateral;  . PORTACATH PLACEMENT N/A 11/21/2018   Procedure: INSERTION LEFT PORT-A-CATH WITH ULTRASOUND GUIDANCE;  Surgeon: Jovita Kussmaul, MD;  Location: [place];  Service: General;  Laterality: N/A;  . TONSILLECTOMY  1987  . TONSILLECTOMY    . WISDOM TOOTH EXTRACTION      FAMILY HISTORY: Family History  Problem Relation Age of Onset  . Hypertension Father   . Alcohol abuse Paternal Grandfather   . Heart disease Paternal Grandfather   . Alcohol abuse Maternal Grandfather   . Heart disease Maternal Grandmother   . Breast cancer Other    Patient's parents are both living at age 42, as of 09/2018. Her father lives in [place], and her mother lives in [place], [place]. The patient denies a family hx of ovarian cancer. A maternal great aunt was diagnosed with breast cancer twice. She has 2 brothers, no sisters   GYNECOLOGIC HISTORY:  No LMP recorded. (Menstrual status: Chemotherapy). Menarche: 39 years old Age at first live birth: 39 years old [place] P 1 Currently having regular periods  Contraceptive: Mirena IUD in place HRT n/a  Hysterectomy? no BSO? no   SOCIAL HISTORY: (updated January 2021) Kaylee Romero is a [employer]. Husband Kaylee Romero is in management with [employer]. [name] is 75 months old.     ADVANCED DIRECTIVES: not in place. Her husband is automatically her HCPOA.   HEALTH MAINTENANCE: Social History   Tobacco Use  . Smoking status: Former Smoker    Packs/day: 0.50    Years: 10.00    Pack years: 5.00    Types: Cigarettes    Start date: 01/13/2002    Quit date: 10/17/2016    Years since quitting: 2.5  . Smokeless tobacco: Never Used  Substance  Use Topics  . Alcohol use: Yes    Comment: Socially  . Drug use: No     Colonoscopy: n/a  PAP: 09/20/2018  Bone density: n/a   Allergies  Allergen Reactions  . Betadine [Povidone Iodine] Rash    Current Outpatient Medications  Medication Sig Dispense Refill  . acetaminophen (TYLENOL) 500 MG tablet Take 1 tablet (500 mg total) by mouth every 6 (six) hours as needed. 60 tablet 0  . amphetamine-dextroamphetamine (ADDERALL XR) 20 MG 24 hr capsule Take 20 mg by mouth daily.    . Ascorbic Acid (VITAMIN C) 1000 MG tablet Take 1,000 mg by mouth daily.    . citalopram (CELEXA) 20 MG tablet Take 20 mg by mouth at bedtime.     . clobetasol cream (TEMOVATE) 7.06 % Apply 1 application topically daily.    Marland Kitchen gabapentin (NEURONTIN) 300 MG capsule Take 1 capsule (300 mg total) by mouth at bedtime. 90 capsule 4  . hydrocortisone (ANUSOL-HC) 25 MG suppository Place 1 suppository (25 mg total) rectally 2 (two) times daily as needed for hemorrhoids or anal itching. 24 suppository 1  . hydrOXYzine (ATARAX/VISTARIL) 10 MG tablet Take 10 mg by mouth at bedtime.     . lidocaine-prilocaine (EMLA) cream Apply to  affected area once 30 g 3  . LORazepam (ATIVAN) 0.5 MG tablet Take 1 tablet (0.5 mg total) by mouth at bedtime as needed (Nausea or vomiting). 30 tablet 0  . Multiple Vitamin (MULTIVITAMIN WITH MINERALS) TABS tablet Take 1 tablet by mouth daily.    . naproxen sodium (ALEVE) 220 MG tablet Take 1 tablet (220 mg total) by mouth 3 (three) times daily with meals. 60 tablet 3  . prochlorperazine (COMPAZINE) 10 MG tablet Take 1 tablet (10 mg total) by mouth every 6 (six) hours as needed (Nausea or vomiting). 30 tablet 1  . Tetrahydrozoline HCl (VISINE OP) Place 1 drop into both eyes daily as needed (redness).    . triamcinolone (KENALOG) 0.025 % ointment Apply 1 application topically 2 (two) times daily. 30 g 0  . vitamin B-12 (CYANOCOBALAMIN) 1000 MCG tablet Take 1,000 mcg by mouth daily.    Marland Kitchen venlafaxine XR  (EFFEXOR-XR) 37.5 MG 24 hr capsule Take 1 capsule (37.5 mg total) by mouth daily with breakfast. 30 capsule 0   No current facility-administered medications for this visit.   Facility-Administered Medications Ordered in Other Visits  Medication Dose Route Frequency Provider Last Rate Last Admin  . heparin lock flush 100 unit/mL  500 Units Intracatheter Once PRN Magrinat, Virgie Dad, MD      . sodium chloride flush (NS) 0.9 % injection 10 mL  10 mL Intracatheter PRN Magrinat, Virgie Dad, MD        OBJECTIVE:  Vitals:   04/27/19 0904 04/27/19 0913  BP: 127/85   Pulse: (!) 109   Resp: 20   Temp:  98.2 F (36.8 C)  SpO2: 100%    Wt Readings from Last 3 Encounters:  04/27/19 182 lb 6.4 oz (82.7 kg)  04/20/19 184 lb (83.5 kg)  04/06/19 188 lb 12.8 oz (85.6 kg)   Body mass index is 31.31 kg/m.    ECOG FS:1 - Symptomatic but completely ambulatory GENERAL: Patient is a well appearing female in no acute distress HEENT:  Sclerae anicteric.  Oropharynx clear and moist. No ulcerations or evidence of oropharyngeal candidiasis. Neck is supple.  NODES:  No cervical, supraclavicular, or axillary lymphadenopathy palpated.  BREAST EXAM:  Right breast s/p lumpectomy and reduction, no sign of local recurrence, left breast benign. LUNGS:  Clear to auscultation bilaterally.  No wheezes or rhonchi. HEART:  Regular rate and rhythm. No murmur appreciated. ABDOMEN:  Soft, nontender.  Positive, normoactive bowel sounds. No organomegaly palpated. MSK:  No focal spinal tenderness to palpation. Full range of motion bilaterally in the upper extremities. EXTREMITIES:  No peripheral edema.   SKIN:  Clear with no obvious rashes or skin changes. No nail dyscrasia. NEURO:  Nonfocal. Well oriented.  Appropriate affect.     LAB RESULTS:  CMP     Component Value Date/Time   NA 138 04/27/2019 0902   K 3.9 04/27/2019 0902   CL 103 04/27/2019 0902   CO2 26 04/27/2019 0902   GLUCOSE 106 (H) 04/27/2019 0902    BUN 13 04/27/2019 0902   CREATININE 0.68 04/27/2019 0902   CREATININE 0.68 10/05/2018 1244   CALCIUM 9.8 04/27/2019 0902   PROT 7.4 04/27/2019 0902   ALBUMIN 4.0 04/27/2019 0902   AST 38 04/27/2019 0902   AST 15 10/05/2018 1244   ALT 56 (H) 04/27/2019 0902   ALT 18 10/05/2018 1244   ALKPHOS 59 04/27/2019 0902   BILITOT 0.4 04/27/2019 0902   BILITOT 0.5 10/05/2018 1244   GFRNONAA >60 04/27/2019 0902  GFRNONAA >60 10/05/2018 1244   GFRAA >60 04/27/2019 0902   GFRAA >60 10/05/2018 1244    No results found for: TOTALPROTELP, ALBUMINELP, A1GS, A2GS, BETS, BETA2SER, GAMS, MSPIKE, SPEI  No results found for: KPAFRELGTCHN, LAMBDASER, [facility]  Lab Results  Component Value Date   WBC 4.2 04/27/2019   NEUTROABS 2.7 04/27/2019   HGB 12.9 04/27/2019   HCT 38.2 04/27/2019   MCV 97.9 04/27/2019   PLT 311 04/27/2019    No results found for: LABCA2  No components found for: [id number]  No results for input(s): INR in the last 168 hours.  No results found for: LABCA2  No results found for: MMN817  No results found for: RNH657  No results found for: XUX833  No results found for: CA2729  No components found for: HGQUANT  No results found for: CEA1 / No results found for: CEA1   No results found for: AFPTUMOR  No results found for: CHROMOGRNA  No results found for: HGBA, HGBA2QUANT, HGBFQUANT, HGBSQUAN (Hemoglobinopathy evaluation)   No results found for: LDH  No results found for: IRON, TIBC, IRONPCTSAT (Iron and TIBC)  No results found for: FERRITIN  Urinalysis    Component Value Date/Time   BILIRUBINUR Neg 01/18/2012 1458   PROTEINUR Neg 01/18/2012 1458   UROBILINOGEN 0.2 01/18/2012 1458   NITRITE Neg 01/18/2012 1458   LEUKOCYTESUR Negative 01/18/2012 1458     STUDIES: No results found.   ELIGIBLE FOR AVAILABLE RESEARCH PROTOCOL: no  ASSESSMENT: 39 y.o. Kaylee Romero woman status post right breast upper inner quadrant biopsy on 09/26/2018 for a  clinical T2 N0, stage IB invasive ductal carcinoma, grade 2, estrogen and progesterone receptor positive, HER-2 nonamplified, with an MIB-1-1 of 35%  (1) Genetic testing reported on 10/05/2018 through the [facility] Breast Cancer STAT Panel + Common Hereditary cancer panel found no pathogenic mutations. The STAT Breast cancer panel offered by [facility] includes sequencing and rearrangement analysis for the following 9 genes:  ATM, BRCA1, BRCA2, CDH1, CHEK2, PALB2, PTEN, STK11 and TP53. The Common Hereditary Cancers Panel offered by [facility] includes sequencing and/or deletion duplication testing of the following 47 genes: APC, ATM, AXIN2, BARD1, BMPR1A, BRCA1, BRCA2, BRIP1, CDH1, CDKN2A (p14ARF), CDKN2A (p16INK4a), CKD4, CHEK2, CTNNA1, DICER1, EPCAM (Deletion/duplication testing only), GREM1 (promoter region deletion/duplication testing only), KIT, MEN1, MLH1, MSH2, MSH3, MSH6, MUTYH, NBN, NF1, NHTL1, PALB2, PDGFRA, PMS2, POLD1, POLE, PTEN, RAD50, RAD51C, RAD51D, SDHB, SDHC, SDHD, SMAD4, SMARCA4. STK11, TP53, TSC1, TSC2, and VHL.  The following genes were evaluated for sequence changes only: SDHA and HOXB13 c.251G>A variant only.   (2) MammaPrint obtained from the original breast biopsy returned "high risk" indicating the patient will have an excellent prognosis if she receives adjuvant chemotherapy  (3) status post right lumpectomy and right axillary sentinel lymph node sampling 11/21/2018 for a pT2 pN1, stage IIA invasive ductal carcinoma, grade 2, with negative margins  (a) a total of 4 sentinel lymph nodes removed, 1 with macrometastasis  (b) status post bilateral breast reductions at the time of lumpectomy  (4) adjuvant chemotherapy consisting of cyclophosphamide and doxorubicin in dose dense fashion x4 started 01/05/2019, completed 02/15/2019, followed by weekly paclitaxel x12 started 03/02/2019  (a) echo 10/27/2018 shows an ejection fraction in the 55-60% range  (5) adjuvant radiation to follow, in  [place] per patient's request  (6) tamoxifen started 10/05/2018 in anticipation of surgical delays, discontinued 12/05/2018   PLAN: Mikhaila is beginning to have a tough time with chemotherapy and all of the fatigue associated with it. A lot  of the issues she describes are hormonal issues from changes in her body from the chemotherapy.  She will continue on the weekly Paclitaxel for now.  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  We will monitor it closely for now.  She knows that a lot of times if/when the neuropathy becomes constant, we will stop the weekly Paclitaxel.  I further reviewed with her that the majority of benefit from the adjuvant chemotherapy is from the cyclophosphamide and doxorubicin.  The weekly paclitaxel adds 3% risk reduction.  At this point, having received half of her weekly paclitaxel she is at 1.5% already.  She understands this.    For her hot flashes, she is taking gabapentin at night and it does help, it just doesn't help completely.  She notes she has been on citalopram for so long, she wonders if it is doing what it should be at this point.  After discussion, she will taper down off of the Citalopram and we will start Effexor XR at 37.'5mg'$  daily. I will see her back next week in the middle of her taper off to see how she is doing with it.    I examined her sore breasts and don't see anything that appears to be a recurrence.  I gave Kayla a lot of reassurance today, and encouragement.    We will see her back in one week for labs, f/u, and her next chemotherapy.  She was recommended to continue with the appropriate pandemic precautions. She knows to call for any questions that may arise between now and her next appointment.  We are happy to see her sooner if needed.    Total encounter time: 30 minutes*  Wilber Bihari, NP 04/27/19 12:44 PM Medical Oncology and Hematology [facility] [place], [place] [address] Tel.  [phone]    Fax. [phone]     *Total Encounter Time as defined by the Centers for Medicare and Medicaid Services includes, in addition to the face-to-face time of a patient visit (documented in the note above) non-face-to-face time: obtaining and reviewing outside history, ordering and reviewing medications, tests or procedures, care coordination (communications with other health care professionals or caregivers) and documentation in the medical record.

## 2019-04-28 ENCOUNTER — Telehealth: Payer: Self-pay | Admitting: Adult Health

## 2019-04-28 NOTE — Telephone Encounter (Signed)
No 4/22 los. No changes made to pt's schedule.  

## 2019-05-02 NOTE — Progress Notes (Signed)
[facility]  [phone] [phone]     ID: Kaylee Romero DOB: 1980/03/03  MR#: [id number]  KYH#:[id number]  Patient Care Team: Linda Hedges, DO as PCP - General (Obstetrics and Gynecology) Mauro Kaufmann, RN as Oncology Nurse Navigator Rockwell Germany, RN as Oncology Nurse Navigator Gery Pray, MD as Consulting Physician (Radiation Oncology) Magrinat, Virgie Dad, MD as Consulting Physician (Oncology) Jovita Kussmaul, MD as Consulting Physician (General Surgery) Dillingham, Loel Lofty, DO as Attending Physician (Plastic Surgery) Scot Dock, NP OTHER MD:  CHIEF COMPLAINT: estrogen receptor positive breast cancer  CURRENT TREATMENT: Adjuvant chemotherapy   INTERVAL HISTORY: Kaylee Romero  returns today for follow-up and treatment of her estrogen receptor positive breast cancer.   She continues weekly Paclitaxel.  Today is week 8 of treatment.  She is feeling much better this week than she did last week.  She has more energy and has not noticed any peripheral neuropathy.  REVIEW OF SYSTEMS: Kaylee Romero is going to the beach this weekend with her girlfriends.  She is very much looking forward to this trip.  She notes her sleep is improved with taking melatonin and CBD gummies at night.  She is going to stop taking her other medications that help her sleep.  She continues on a low carb diet and exercise.  She is doing well with this, and hasn't gained any more weight.  She is also weaning off of her Celexa and is tolerating this well.   Kaylee Romero denies any fever, chills, chest pain, palpitations, cough, shortness of breath, bowel/bladder changes, nausea, vomiting, skin changes, headaches, vision issues, or further concerns.  A detailed ROS was otherwise non contributory.     HISTORY OF CURRENT ILLNESS: From the original intake note:  Kaylee Romero (pronounced "Bram-Hall") found a palpable lump in the 12 o'clock position of the right breast, which he brought to  the attention of her gynecologist.. She underwent bilateral diagnostic mammography with tomography and bilateral breast ultrasonography at [facility] on 09/23/2018 showing: breast density category C; a spiculated mass with associated with architectural distortion and pleomorphic calcifications lying in the 11-12 o'clock position, middle depth, measuring 2 cm in size, corresponding to the palpable abnormality.   Physical exam confirmed a firm but mobile mass in the 12 o'clock position of the right breast.  By ultrasound there was a highly suspicious 2.1 cm mass in the 12 o'clock position of the right breast; no other evidence of breast malignancy or lymphadenopathy bilaterally.Sonographic evaluation of the right axilla found no enlarged or abnormal appearing lymph node  In the left axilla there were several lymph nodes with areas of cortical prominence similar to the lymph node noted on the right.  Given the symmetry all these are presumed to be reactive  Accordingly on 09/26/2018 she proceeded to biopsy of the right breast area in question. The pathology from this procedure ([id number]) showed: invasive ductal carcinoma, grade 2; prognostic indicators significant for: estrogen receptor, 80% positive with moderate staining intensity and progesterone receptor, 90% positive with strong staining intensity. Proliferation marker Ki67 at 35%. HER2 negative by immunohistochemistry (1+).  The patient's subsequent history is as detailed below.   PAST MEDICAL HISTORY: Past Medical History:  Diagnosis Date   Anxiety    Family history of breast cancer    HSV (herpes simplex virus) anogenital infection    positive titer only    PAST SURGICAL HISTORY: Past Surgical History:  Procedure Laterality Date   BREAST LUMPECTOMY WITH  AXILLARY LYMPH NODE BIOPSY Right 11/21/2018   Procedure: RIGHT BREAST LUMPECTOMY WITH SENTINEL LYMPH NODE BIOPSY;  Surgeon: Jovita Kussmaul, MD;  Location: [facility];  Service:  General;  Laterality: Right;   BREAST REDUCTION SURGERY Bilateral 11/21/2018   Procedure: BILATERAL MAMMARY REDUCTION  (BREAST);  Surgeon: Wallace Going, DO;  Location: [facility];  Service: Plastics;  Laterality: Bilateral;   PORTACATH PLACEMENT N/A 11/21/2018   Procedure: INSERTION LEFT PORT-A-CATH WITH ULTRASOUND GUIDANCE;  Surgeon: Jovita Kussmaul, MD;  Location: [facility];  Service: General;  Laterality: N/A;   TONSILLECTOMY  1987   TONSILLECTOMY     WISDOM TOOTH EXTRACTION      FAMILY HISTORY: Family History  Problem Relation Age of Onset   Hypertension Father    Alcohol abuse Paternal Grandfather    Heart disease Paternal Grandfather    Alcohol abuse Maternal Grandfather    Heart disease Maternal Grandmother    Breast cancer Other    Patient's parents are both living at age 44, as of 09/2018. Her father lives in [place], and her mother lives in [place], [place]. The patient denies a family hx of ovarian cancer. A maternal great aunt was diagnosed with breast cancer twice. She has 2 brothers, no sisters   GYNECOLOGIC HISTORY:  No LMP recorded. (Menstrual status: Chemotherapy). Menarche: 39 years old Age at first live birth: 39 years old [place] [address] Currently having regular periods  Contraceptive: Mirena IUD in place HRT n/a  Hysterectomy? no BSO? no   SOCIAL HISTORY: (updated January 2021) Freddy is a [employer]. Husband Tommi Rumps is in management with [employer]. Daughter Normand Sloop is 67 months old.     ADVANCED DIRECTIVES: not in place. Her husband is automatically her HCPOA.   HEALTH MAINTENANCE: Social History   Tobacco Use   Smoking status: Former Smoker    Packs/day: 0.50    Years: 10.00    Pack years: 5.00    Types: Cigarettes    Start date: 01/13/2002    Quit date: 10/17/2016    Years since quitting: 2.5   Smokeless tobacco: Never Used  Substance Use Topics   Alcohol use: Yes    Comment: Socially   Drug use: No     Colonoscopy: n/a  PAP:  09/20/2018  Bone density: n/a   Allergies  Allergen Reactions   Betadine [Povidone Iodine] Rash    Current Outpatient Medications  Medication Sig Dispense Refill   acetaminophen (TYLENOL) 500 MG tablet Take 1 tablet (500 mg total) by mouth every 6 (six) hours as needed. 60 tablet 0   amphetamine-dextroamphetamine (ADDERALL XR) 20 MG 24 hr capsule Take 20 mg by mouth daily.     Ascorbic Acid (VITAMIN C) 1000 MG tablet Take 1,000 mg by mouth daily.     clobetasol cream (TEMOVATE) 3.23 % Apply 1 application topically daily.     gabapentin (NEURONTIN) 300 MG capsule Take 1 capsule (300 mg total) by mouth at bedtime. 90 capsule 4   hydrOXYzine (ATARAX/VISTARIL) 10 MG tablet Take 10 mg by mouth at bedtime.      lidocaine-prilocaine (EMLA) cream Apply to affected area once 30 g 3   LORazepam (ATIVAN) 0.5 MG tablet Take 1 tablet (0.5 mg total) by mouth at bedtime as needed (Nausea or vomiting). 30 tablet 0   Multiple Vitamin (MULTIVITAMIN WITH MINERALS) TABS tablet Take 1 tablet by mouth daily.     naproxen sodium (ALEVE) 220 MG tablet Take 1 tablet (220 mg total) by mouth 3 (three)  times daily with meals. 60 tablet 3   prochlorperazine (COMPAZINE) 10 MG tablet Take 1 tablet (10 mg total) by mouth every 6 (six) hours as needed (Nausea or vomiting). 30 tablet 1   Tetrahydrozoline HCl (VISINE OP) Place 1 drop into both eyes daily as needed (redness).     venlafaxine XR (EFFEXOR-XR) 37.5 MG 24 hr capsule Take 1 capsule (37.5 mg total) by mouth daily with breakfast. 30 capsule 0   vitamin B-12 (CYANOCOBALAMIN) 1000 MCG tablet Take 1,000 mcg by mouth daily.     No current facility-administered medications for this visit.   Facility-Administered Medications Ordered in Other Visits  Medication Dose Route Frequency Provider Last Rate Last Admin   famotidine (PEPCID) IVPB 20 mg premix  20 mg Intravenous Once Magrinat, Virgie Dad, MD 100 mL/hr at 05/03/19 1244 20 mg at 05/03/19 1244    heparin lock flush 100 unit/mL  500 Units Intracatheter Once PRN Magrinat, Virgie Dad, MD       PACLitaxel (TAXOL) 150 mg in sodium chloride 0.9 % 250 mL chemo infusion (</= '80mg'$ /m2)  80 mg/m2 (Treatment Plan Recorded) Intravenous Once Magrinat, Virgie Dad, MD       sodium chloride flush (NS) 0.9 % injection 10 mL  10 mL Intracatheter PRN Magrinat, Virgie Dad, MD        OBJECTIVE:  Vitals:   05/03/19 1141  BP: 123/82  Pulse: (!) 101  Resp: 18  Temp: 98.3 F (36.8 C)  SpO2: 99%   Wt Readings from Last 3 Encounters:  05/03/19 182 lb 12.8 oz (82.9 kg)  04/27/19 182 lb 6.4 oz (82.7 kg)  04/20/19 184 lb (83.5 kg)   Body mass index is 31.38 kg/m.    ECOG FS:1 - Symptomatic but completely ambulatory GENERAL: Patient is a well appearing female in no acute distress HEENT:  Sclerae anicteric. Mask in place. Neck is supple.  NODES:  No cervical, supraclavicular, or axillary lymphadenopathy palpated.  BREAST EXAM:  Right breast s/p lumpectomy and reduction, no sign of local recurrence, left breast benign. LUNGS:  Clear to auscultation bilaterally.  No wheezes or rhonchi. HEART:  Regular rate and rhythm. No murmur appreciated. ABDOMEN:  Soft, nontender.  Positive, normoactive bowel sounds. No organomegaly palpated. MSK:  No focal spinal tenderness to palpation. Full range of motion bilaterally in the upper extremities. EXTREMITIES:  No peripheral edema.   SKIN:  Clear with no obvious rashes or skin changes. No nail dyscrasia. NEURO:  Nonfocal. Well oriented.  Appropriate affect.     LAB RESULTS:  CMP     Component Value Date/Time   NA 140 05/03/2019 1029   K 4.0 05/03/2019 1029   CL 103 05/03/2019 1029   CO2 28 05/03/2019 1029   GLUCOSE 89 05/03/2019 1029   BUN 16 05/03/2019 1029   CREATININE 0.66 05/03/2019 1029   CREATININE 0.68 10/05/2018 1244   CALCIUM 9.7 05/03/2019 1029   PROT 7.1 05/03/2019 1029   ALBUMIN 4.0 05/03/2019 1029   AST 24 05/03/2019 1029   AST 15 10/05/2018  1244   ALT 40 05/03/2019 1029   ALT 18 10/05/2018 1244   ALKPHOS 58 05/03/2019 1029   BILITOT 0.3 05/03/2019 1029   BILITOT 0.5 10/05/2018 1244   GFRNONAA >60 05/03/2019 1029   GFRNONAA >60 10/05/2018 1244   GFRAA >60 05/03/2019 1029   GFRAA >60 10/05/2018 1244    No results found for: TOTALPROTELP, ALBUMINELP, A1GS, A2GS, BETS, BETA2SER, GAMS, MSPIKE, SPEI  No results found for: KPAFRELGTCHN, LAMBDASER, KAPLAMBRATIO  Lab Results  Component Value Date   WBC 6.1 05/03/2019   NEUTROABS 4.6 05/03/2019   HGB 12.7 05/03/2019   HCT 36.8 05/03/2019   MCV 95.1 05/03/2019   PLT 331 05/03/2019    No results found for: LABCA2  No components found for: CVELFY101  No results for input(s): INR in the last 168 hours.  No results found for: LABCA2  No results found for: BPZ025  No results found for: ENI778  No results found for: EUM353  No results found for: CA2729  No components found for: HGQUANT  No results found for: CEA1 / No results found for: CEA1   No results found for: AFPTUMOR  No results found for: CHROMOGRNA  No results found for: HGBA, HGBA2QUANT, HGBFQUANT, HGBSQUAN (Hemoglobinopathy evaluation)   No results found for: LDH  No results found for: IRON, TIBC, IRONPCTSAT (Iron and TIBC)  No results found for: FERRITIN  Urinalysis    Component Value Date/Time   BILIRUBINUR Neg 01/18/2012 1458   PROTEINUR Neg 01/18/2012 1458   UROBILINOGEN 0.2 01/18/2012 1458   NITRITE Neg 01/18/2012 1458   LEUKOCYTESUR Negative 01/18/2012 1458     STUDIES: No results found.   ELIGIBLE FOR AVAILABLE RESEARCH PROTOCOL: no  ASSESSMENT: 39 y.o. [place] woman status post right breast upper inner quadrant biopsy on 09/26/2018 for a clinical T2 N0, stage IB invasive ductal carcinoma, grade 2, estrogen and progesterone receptor positive, HER-2 nonamplified, with an MIB-1-1 of 35%  (1) Genetic testing reported on 10/05/2018 through the [facility] Breast Cancer STAT Panel  + Common Hereditary cancer panel found no pathogenic mutations. The STAT Breast cancer panel offered by [facility] includes sequencing and rearrangement analysis for the following 9 genes:  ATM, BRCA1, BRCA2, CDH1, CHEK2, PALB2, PTEN, STK11 and TP53. The Common Hereditary Cancers Panel offered by [facility] includes sequencing and/or deletion duplication testing of the following 47 genes: APC, ATM, AXIN2, BARD1, BMPR1A, BRCA1, BRCA2, BRIP1, CDH1, CDKN2A (p14ARF), CDKN2A (p16INK4a), CKD4, CHEK2, CTNNA1, DICER1, EPCAM (Deletion/duplication testing only), GREM1 (promoter region deletion/duplication testing only), KIT, MEN1, MLH1, MSH2, MSH3, MSH6, MUTYH, NBN, NF1, NHTL1, PALB2, PDGFRA, PMS2, POLD1, POLE, PTEN, RAD50, RAD51C, RAD51D, SDHB, SDHC, SDHD, SMAD4, SMARCA4. STK11, TP53, TSC1, TSC2, and VHL.  The following genes were evaluated for sequence changes only: SDHA and HOXB13 c.251G>A variant only.   (2) MammaPrint obtained from the original breast biopsy returned "high risk" indicating the patient will have an excellent prognosis if she receives adjuvant chemotherapy  (3) status post right lumpectomy and right axillary sentinel lymph node sampling 11/21/2018 for a pT2 pN1, stage IIA invasive ductal carcinoma, grade 2, with negative margins  (a) a total of 4 sentinel lymph nodes removed, 1 with macrometastasis  (b) status post bilateral breast reductions at the time of lumpectomy  (4) adjuvant chemotherapy consisting of cyclophosphamide and doxorubicin in dose dense fashion x4 started 01/05/2019, completed 02/15/2019, followed by weekly paclitaxel x12 started 03/02/2019  (a) echo 10/27/2018 shows an ejection fraction in the 55-60% range  (5) adjuvant radiation to follow, in [place] per patient's request  (6) tamoxifen started 10/05/2018 in anticipation of surgical delays, discontinued 12/05/2018   PLAN: Ellanor is feeling much better today and I am very happy about this.  She continues on weekly Paclitaxel  and is tolerating it moderately well.  She has no peripheral neuropathy this week and we are monitoring her closely for this.  She knows to let us know if it returns.  I think a lot of the reason she is  feeling better is secondary to the improvement in her sleep.  She will continue taking her melatonin and CBD gummies. She will continue to wean off of the celexa and is due to restart the effexor soon.  This should help with anxiety and hot flashes.    Azarah is going to the beach this weekend.  We talked about sun protection, sunscreen, and shading under an umbrella.    She will continue with her low carb diet and exercise.  I went ahead and requested the rest of her chemotherapy be scheduled.  I also placed a referral to Dr. Baruch Gouty in [place].  We will see her back in one week for labs, and her next chemotherapy.  She was recommended to continue with the appropriate pandemic precautions. She knows to call for any questions that may arise between now and her next appointment.  We are happy to see her sooner if needed.    Total encounter time: 30 minutes*  Wilber Bihari, NP 05/03/19 1:06 PM Medical Oncology and Hematology [facility] [place], [place] [address] Tel. [phone]    Fax. [phone]     *Total Encounter Time as defined by the Centers for Medicare and Medicaid Services includes, in addition to the face-to-face time of a patient visit (documented in the note above) non-face-to-face time: obtaining and reviewing outside history, ordering and reviewing medications, tests or procedures, care coordination (communications with other health care professionals or caregivers) and documentation in the medical record.

## 2019-05-03 ENCOUNTER — Inpatient Hospital Stay: Payer: BC Managed Care – PPO

## 2019-05-03 ENCOUNTER — Encounter: Payer: Self-pay | Admitting: *Deleted

## 2019-05-03 ENCOUNTER — Inpatient Hospital Stay: Payer: BC Managed Care – PPO | Admitting: Adult Health

## 2019-05-03 ENCOUNTER — Other Ambulatory Visit: Payer: Self-pay

## 2019-05-03 VITALS — HR 91

## 2019-05-03 VITALS — BP 123/82 | HR 101 | Temp 98.3°F | Resp 18 | Ht 64.0 in | Wt 182.8 lb

## 2019-05-03 DIAGNOSIS — Z95828 Presence of other vascular implants and grafts: Secondary | ICD-10-CM

## 2019-05-03 DIAGNOSIS — Z170 Estrogen receptor positive status [ER+]: Secondary | ICD-10-CM | POA: Diagnosis not present

## 2019-05-03 DIAGNOSIS — C50411 Malignant neoplasm of upper-outer quadrant of right female breast: Secondary | ICD-10-CM

## 2019-05-03 DIAGNOSIS — Z5111 Encounter for antineoplastic chemotherapy: Secondary | ICD-10-CM | POA: Diagnosis not present

## 2019-05-03 LAB — COMPREHENSIVE METABOLIC PANEL
ALT: 40 U/L (ref 0–44)
AST: 24 U/L (ref 15–41)
Albumin: 4 g/dL (ref 3.5–5.0)
Alkaline Phosphatase: 58 U/L (ref 38–126)
Anion gap: 9 (ref 5–15)
BUN: 16 mg/dL (ref 6–20)
CO2: 28 mmol/L (ref 22–32)
Calcium: 9.7 mg/dL (ref 8.9–10.3)
Chloride: 103 mmol/L (ref 98–111)
Creatinine, Ser: 0.66 mg/dL (ref 0.44–1.00)
GFR calc Af Amer: >60 mL/min (ref >60)
GFR calc non Af Amer: >60 mL/min (ref >60)
Glucose, Bld: 89 mg/dL (ref 70–99)
Potassium: 4 mmol/L (ref 3.5–5.1)
Sodium: 140 mmol/L (ref 135–145)
Total Bilirubin: 0.3 mg/dL (ref 0.3–1.2)
Total Protein: 7.1 g/dL (ref 6.5–8.1)

## 2019-05-03 LAB — CBC WITH DIFFERENTIAL/PLATELET
Abs Immature Granulocytes: 0.03 10*3/uL (ref 0.00–0.07)
Basophils Absolute: 0 10*3/uL (ref 0.0–0.1)
Basophils Relative: 1 %
Eosinophils Absolute: 0.2 10*3/uL (ref 0.0–0.5)
Eosinophils Relative: 3 %
HCT: 36.8 % (ref 36.0–46.0)
Hemoglobin: 12.7 g/dL (ref 12.0–15.0)
Immature Granulocytes: 1 %
Lymphocytes Relative: 13 %
Lymphs Abs: 0.8 10*3/uL (ref 0.7–4.0)
MCH: 32.8 pg (ref 26.0–34.0)
MCHC: 34.5 g/dL (ref 30.0–36.0)
MCV: 95.1 fL (ref 80.0–100.0)
Monocytes Absolute: 0.5 10*3/uL (ref 0.1–1.0)
Monocytes Relative: 8 %
Neutro Abs: 4.6 10*3/uL (ref 1.7–7.7)
Neutrophils Relative %: 74 %
Platelets: 331 10*3/uL (ref 150–400)
RBC: 3.87 MIL/uL (ref 3.87–5.11)
RDW: 12.8 % (ref 11.5–15.5)
WBC: 6.1 10*3/uL (ref 4.0–10.5)
nRBC: 0 % (ref 0.0–0.2)

## 2019-05-03 MED ORDER — SODIUM CHLORIDE 0.9% FLUSH
10.0000 mL | Freq: Once | INTRAVENOUS | Status: AC
  Administered 2019-05-03: 11:00:00 10 mL
  Filled 2019-05-03: qty 10

## 2019-05-03 MED ORDER — SODIUM CHLORIDE 0.9 % IV SOLN
Freq: Once | INTRAVENOUS | Status: AC
  Filled 2019-05-03: qty 250

## 2019-05-03 MED ORDER — FAMOTIDINE IN NACL 20-0.9 MG/50ML-% IV SOLN
INTRAVENOUS | Status: AC
  Filled 2019-05-03: qty 50

## 2019-05-03 MED ORDER — SODIUM CHLORIDE 0.9% FLUSH
10.0000 mL | INTRAVENOUS | Status: DC | PRN
  Administered 2019-05-03: 15:00:00 10 mL
  Filled 2019-05-03: qty 10

## 2019-05-03 MED ORDER — HEPARIN SOD (PORK) LOCK FLUSH 100 UNIT/ML IV SOLN
500.0000 [IU] | Freq: Once | INTRAVENOUS | Status: AC | PRN
  Administered 2019-05-03: 15:00:00 500 [IU]
  Filled 2019-05-03: qty 5

## 2019-05-03 MED ORDER — DEXAMETHASONE SODIUM PHOSPHATE 10 MG/ML IJ SOLN
4.0000 mg | Freq: Once | INTRAMUSCULAR | Status: AC
  Administered 2019-05-03: 4 mg via INTRAVENOUS

## 2019-05-03 MED ORDER — DIPHENHYDRAMINE HCL 50 MG/ML IJ SOLN
INTRAMUSCULAR | Status: AC
  Filled 2019-05-03: qty 1

## 2019-05-03 MED ORDER — DIPHENHYDRAMINE HCL 50 MG/ML IJ SOLN
25.0000 mg | Freq: Once | INTRAMUSCULAR | Status: AC
  Administered 2019-05-03: 25 mg via INTRAVENOUS

## 2019-05-03 MED ORDER — SODIUM CHLORIDE 0.9 % IV SOLN
80.0000 mg/m2 | Freq: Once | INTRAVENOUS | Status: AC
  Administered 2019-05-03: 14:00:00 150 mg via INTRAVENOUS
  Filled 2019-05-03: qty 25

## 2019-05-03 MED ORDER — DEXAMETHASONE SODIUM PHOSPHATE 10 MG/ML IJ SOLN
INTRAMUSCULAR | Status: AC
  Filled 2019-05-03: qty 1

## 2019-05-03 MED ORDER — FAMOTIDINE IN NACL 20-0.9 MG/50ML-% IV SOLN
20.0000 mg | Freq: Once | INTRAVENOUS | Status: AC
  Administered 2019-05-03: 13:00:00 20 mg via INTRAVENOUS

## 2019-05-03 NOTE — Patient Instructions (Signed)
[facility] Discharge Instructions for Patients Receiving Chemotherapy  Today you received the following chemotherapy agents: paclitaxel.  To help prevent nausea and vomiting after your treatment, we encourage you to take your nausea medication as directed.   If you develop nausea and vomiting that is not controlled by your nausea medication, call the clinic.   BELOW ARE SYMPTOMS THAT SHOULD BE REPORTED IMMEDIATELY:  *FEVER GREATER THAN 100.5 F  *CHILLS WITH OR WITHOUT FEVER  NAUSEA AND VOMITING THAT IS NOT CONTROLLED WITH YOUR NAUSEA MEDICATION  *UNUSUAL SHORTNESS OF BREATH  *UNUSUAL BRUISING OR BLEEDING  TENDERNESS IN MOUTH AND THROAT WITH OR WITHOUT PRESENCE OF ULCERS  *URINARY PROBLEMS  *BOWEL PROBLEMS  UNUSUAL RASH Items with * indicate a potential emergency and should be followed up as soon as possible.  Feel free to call the clinic should you have any questions or concerns. The clinic phone number is [phone].  Please show the CHEMO ALERT CARD at check-in to the Emergency Department and triage nurse.   

## 2019-05-04 ENCOUNTER — Ambulatory Visit: Payer: BC Managed Care – PPO | Admitting: Oncology

## 2019-05-04 ENCOUNTER — Other Ambulatory Visit: Payer: BC Managed Care – PPO

## 2019-05-04 ENCOUNTER — Ambulatory Visit: Payer: BC Managed Care – PPO

## 2019-05-05 ENCOUNTER — Telehealth: Payer: Self-pay | Admitting: Adult Health

## 2019-05-05 NOTE — Telephone Encounter (Signed)
Scheduled appts per 4/28 los. Left voicemail with next appt date and time. Pt to get updated appt calendar at next appt per appt notes.

## 2019-05-05 NOTE — Progress Notes (Signed)
Pharmacist Chemotherapy Monitoring - Follow Up Assessment    I verify that I have reviewed each item in the below checklist:  . Regimen for the patient is scheduled for the appropriate day and plan matches scheduled date. Marland Kitchen Appropriate non-routine labs are ordered dependent on drug ordered. . If applicable, additional medications reviewed and ordered per protocol based on lifetime cumulative doses and/or treatment regimen.   Plan for follow-up and/or issues identified: No . I-vent associated with next due treatment: No . MD and/or nursing notified: No  Kaylee Romero 05/05/2019 11:10 AM

## 2019-05-11 ENCOUNTER — Other Ambulatory Visit: Payer: BC Managed Care – PPO

## 2019-05-11 ENCOUNTER — Ambulatory Visit: Payer: BC Managed Care – PPO

## 2019-05-11 ENCOUNTER — Encounter: Payer: Self-pay | Admitting: *Deleted

## 2019-05-11 ENCOUNTER — Ambulatory Visit: Payer: BC Managed Care – PPO | Admitting: Adult Health

## 2019-05-11 ENCOUNTER — Inpatient Hospital Stay: Payer: BC Managed Care – PPO | Attending: Oncology

## 2019-05-11 ENCOUNTER — Other Ambulatory Visit: Payer: Self-pay

## 2019-05-11 ENCOUNTER — Inpatient Hospital Stay: Payer: BC Managed Care – PPO

## 2019-05-11 VITALS — BP 121/86 | HR 93 | Temp 98.7°F | Resp 17

## 2019-05-11 DIAGNOSIS — C50811 Malignant neoplasm of overlapping sites of right female breast: Secondary | ICD-10-CM | POA: Insufficient documentation

## 2019-05-11 DIAGNOSIS — Z5111 Encounter for antineoplastic chemotherapy: Secondary | ICD-10-CM | POA: Insufficient documentation

## 2019-05-11 DIAGNOSIS — Z170 Estrogen receptor positive status [ER+]: Secondary | ICD-10-CM | POA: Diagnosis not present

## 2019-05-11 LAB — CBC WITH DIFFERENTIAL/PLATELET
Abs Immature Granulocytes: 0.01 10*3/uL (ref 0.00–0.07)
Basophils Absolute: 0.1 10*3/uL (ref 0.0–0.1)
Basophils Relative: 1 %
Eosinophils Absolute: 0.1 10*3/uL (ref 0.0–0.5)
Eosinophils Relative: 3 %
HCT: 37.5 % (ref 36.0–46.0)
Hemoglobin: 12.9 g/dL (ref 12.0–15.0)
Immature Granulocytes: 0 %
Lymphocytes Relative: 16 %
Lymphs Abs: 0.8 10*3/uL (ref 0.7–4.0)
MCH: 33 pg (ref 26.0–34.0)
MCHC: 34.4 g/dL (ref 30.0–36.0)
MCV: 95.9 fL (ref 80.0–100.0)
Monocytes Absolute: 0.4 10*3/uL (ref 0.1–1.0)
Monocytes Relative: 8 %
Neutro Abs: 3.7 10*3/uL (ref 1.7–7.7)
Neutrophils Relative %: 72 %
Platelets: 326 10*3/uL (ref 150–400)
RBC: 3.91 MIL/uL (ref 3.87–5.11)
RDW: 13.1 % (ref 11.5–15.5)
WBC: 5 10*3/uL (ref 4.0–10.5)
nRBC: 0 % (ref 0.0–0.2)

## 2019-05-11 LAB — COMPREHENSIVE METABOLIC PANEL
ALT: 42 U/L (ref 0–44)
AST: 27 U/L (ref 15–41)
Albumin: 4.1 g/dL (ref 3.5–5.0)
Alkaline Phosphatase: 59 U/L (ref 38–126)
Anion gap: 12 (ref 5–15)
BUN: 15 mg/dL (ref 6–20)
CO2: 23 mmol/L (ref 22–32)
Calcium: 9.6 mg/dL (ref 8.9–10.3)
Chloride: 106 mmol/L (ref 98–111)
Creatinine, Ser: 0.66 mg/dL (ref 0.44–1.00)
GFR calc Af Amer: >60 mL/min (ref >60)
GFR calc non Af Amer: >60 mL/min (ref >60)
Glucose, Bld: 99 mg/dL (ref 70–99)
Potassium: 4.2 mmol/L (ref 3.5–5.1)
Sodium: 141 mmol/L (ref 135–145)
Total Bilirubin: 0.3 mg/dL (ref 0.3–1.2)
Total Protein: 7.2 g/dL (ref 6.5–8.1)

## 2019-05-11 MED ORDER — DEXAMETHASONE SODIUM PHOSPHATE 10 MG/ML IJ SOLN
INTRAMUSCULAR | Status: AC
  Filled 2019-05-11: qty 1

## 2019-05-11 MED ORDER — DIPHENHYDRAMINE HCL 50 MG/ML IJ SOLN
25.0000 mg | Freq: Once | INTRAMUSCULAR | Status: AC
  Administered 2019-05-11: 25 mg via INTRAVENOUS

## 2019-05-11 MED ORDER — DEXAMETHASONE SODIUM PHOSPHATE 10 MG/ML IJ SOLN
4.0000 mg | Freq: Once | INTRAMUSCULAR | Status: AC
  Administered 2019-05-11: 4 mg via INTRAVENOUS

## 2019-05-11 MED ORDER — SODIUM CHLORIDE 0.9% FLUSH
10.0000 mL | INTRAVENOUS | Status: DC | PRN
  Administered 2019-05-11: 10 mL
  Filled 2019-05-11: qty 10

## 2019-05-11 MED ORDER — DIPHENHYDRAMINE HCL 50 MG/ML IJ SOLN
INTRAMUSCULAR | Status: AC
  Filled 2019-05-11: qty 1

## 2019-05-11 MED ORDER — HEPARIN SOD (PORK) LOCK FLUSH 100 UNIT/ML IV SOLN
500.0000 [IU] | Freq: Once | INTRAVENOUS | Status: AC | PRN
  Administered 2019-05-11: 500 [IU]
  Filled 2019-05-11: qty 5

## 2019-05-11 MED ORDER — SODIUM CHLORIDE 0.9 % IV SOLN
Freq: Once | INTRAVENOUS | Status: AC
  Filled 2019-05-11: qty 250

## 2019-05-11 MED ORDER — SODIUM CHLORIDE 0.9 % IV SOLN
80.0000 mg/m2 | Freq: Once | INTRAVENOUS | Status: AC
  Administered 2019-05-11: 150 mg via INTRAVENOUS
  Filled 2019-05-11: qty 25

## 2019-05-11 MED ORDER — FAMOTIDINE IN NACL 20-0.9 MG/50ML-% IV SOLN
20.0000 mg | Freq: Once | INTRAVENOUS | Status: AC
  Administered 2019-05-11: 20 mg via INTRAVENOUS

## 2019-05-11 MED ORDER — FAMOTIDINE IN NACL 20-0.9 MG/50ML-% IV SOLN
INTRAVENOUS | Status: AC
  Filled 2019-05-11: qty 50

## 2019-05-11 NOTE — Patient Instructions (Signed)
[facility] Discharge Instructions for Patients Receiving Chemotherapy  Today you received the following chemotherapy agents:  Taxol.  To help prevent nausea and vomiting after your treatment, we encourage you to take your nausea medication as directed.   If you develop nausea and vomiting that is not controlled by your nausea medication, call the clinic.   BELOW ARE SYMPTOMS THAT SHOULD BE REPORTED IMMEDIATELY:  *FEVER GREATER THAN 100.5 F  *CHILLS WITH OR WITHOUT FEVER  NAUSEA AND VOMITING THAT IS NOT CONTROLLED WITH YOUR NAUSEA MEDICATION  *UNUSUAL SHORTNESS OF BREATH  *UNUSUAL BRUISING OR BLEEDING  TENDERNESS IN MOUTH AND THROAT WITH OR WITHOUT PRESENCE OF ULCERS  *URINARY PROBLEMS  *BOWEL PROBLEMS  UNUSUAL RASH Items with * indicate a potential emergency and should be followed up as soon as possible.  Feel free to call the clinic should you have any questions or concerns. The clinic phone number is [phone].  Please show the CHEMO ALERT CARD at check-in to the Emergency Department and triage nurse.   

## 2019-05-12 NOTE — Progress Notes (Signed)
Pharmacist Chemotherapy Monitoring - Follow Up Assessment    I verify that I have reviewed each item in the below checklist:  . Regimen for the patient is scheduled for the appropriate day and plan matches scheduled date. Marland Kitchen Appropriate non-routine labs are ordered dependent on drug ordered. . If applicable, additional medications reviewed and ordered per protocol based on lifetime cumulative doses and/or treatment regimen.   Plan for follow-up and/or issues identified: No . I-vent associated with next due treatment: No . MD and/or nursing notified: No  Lamona Eimer D 05/12/2019 12:29 PM

## 2019-05-17 ENCOUNTER — Ambulatory Visit
Admission: RE | Admit: 2019-05-17 | Discharge: 2019-05-17 | Disposition: A | Discharge status: Home / Self Care | Payer: BC Managed Care – PPO | Source: Ambulatory Visit | Attending: Radiation Oncology | Admitting: Radiation Oncology

## 2019-05-17 ENCOUNTER — Other Ambulatory Visit: Payer: Self-pay

## 2019-05-17 ENCOUNTER — Encounter: Payer: Self-pay | Admitting: Radiation Oncology

## 2019-05-17 VITALS — BP 127/94 | HR 113 | Temp 98.7°F | Wt 184.6 lb

## 2019-05-17 DIAGNOSIS — Z79899 Other long term (current) drug therapy: Secondary | ICD-10-CM | POA: Diagnosis not present

## 2019-05-17 DIAGNOSIS — Z170 Estrogen receptor positive status [ER+]: Secondary | ICD-10-CM | POA: Diagnosis not present

## 2019-05-17 DIAGNOSIS — Z87891 Personal history of nicotine dependence: Secondary | ICD-10-CM | POA: Insufficient documentation

## 2019-05-17 DIAGNOSIS — Z803 Family history of malignant neoplasm of breast: Secondary | ICD-10-CM | POA: Diagnosis not present

## 2019-05-17 DIAGNOSIS — F419 Anxiety disorder, unspecified: Secondary | ICD-10-CM | POA: Diagnosis not present

## 2019-05-17 DIAGNOSIS — C50411 Malignant neoplasm of upper-outer quadrant of right female breast: Secondary | ICD-10-CM | POA: Diagnosis not present

## 2019-05-17 NOTE — Consult Note (Signed)
NEW PATIENT EVALUATION  Name: Kaylee Romero  MRN: [id number]  Date:   05/17/2019     DOB: 1980/09/07   This 39 y.o. 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  REFERRING PHYSICIAN: Linda Hedges, DO  CHIEF COMPLAINT:  Chief Complaint  Patient presents with  . Breast Cancer    DIAGNOSIS: The encounter diagnosis was Malignant neoplasm of upper-outer quadrant of right breast in female, estrogen receptor positive ([place]).   PREVIOUS INVESTIGATIONS:  Mammogram and ultrasound reviewed Pathology report reviewed Clinical notes reviewed  HPI: Patient is a 39 year old female who presented with a self discovered mass at the 12 o'clock position of the right breast.  Mammograms at that time showed a highly suspicious 2.1 cm mass in the 12 o'clock position.  Ultrasound of her axilla was negative.  She went underwent an ultrasound-guided biopsy which was positive for invasive mammary carcinoma ER/PR positive HER-2/neu negative.  She then underwent bilateral breast reduction and wide local excision showing a 2.6 cm overall grade 2 invasive ductal carcinoma.  Margins were clear for invasive component at 2.5 mm less than 1 mm for DCIS.  4 sentinel nodes were examined 1 had macro metastatic nodule with the positive measuring 1.1 cm.  Again tumor was strongly ER/PR positive HER-2/neu not overexpressed.  Genetic testing showed no pathologic mutations.  MammaPrint showed high risk indicating benefit to systemic chemotherapy and she underwent dose dense DC followed by Taxol.  She will complete her Taxol at the end of May.  She is tolerating her chemotherapy fairly well she is seen today for radiation oncology consultation she specifically denies breast tenderness cough or bone pain.  PLANNED TREATMENT REGIMEN: Right breast and peripheral  lymphatic radiation  PAST MEDICAL HISTORY:  has a past medical history of Anxiety, Family history of breast cancer, and HSV (herpes simplex virus) anogenital infection.    PAST SURGICAL HISTORY:  Past Surgical History:  Procedure Laterality Date  . BREAST LUMPECTOMY WITH AXILLARY LYMPH NODE BIOPSY Right 11/21/2018   Procedure: RIGHT BREAST LUMPECTOMY WITH SENTINEL LYMPH NODE BIOPSY;  Surgeon: Jovita Kussmaul, MD;  Location: [facility];  Service: General;  Laterality: Right;  . BREAST REDUCTION SURGERY Bilateral 11/21/2018   Procedure: BILATERAL MAMMARY REDUCTION  (BREAST);  Surgeon: Wallace Going, DO;  Location: [facility];  Service: Plastics;  Laterality: Bilateral;  . PORTACATH PLACEMENT N/A 11/21/2018   Procedure: INSERTION LEFT PORT-A-CATH WITH ULTRASOUND GUIDANCE;  Surgeon: Jovita Kussmaul, MD;  Location: [facility];  Service: General;  Laterality: N/A;  . TONSILLECTOMY  1987  . TONSILLECTOMY    . WISDOM TOOTH EXTRACTION      FAMILY HISTORY: family history includes Alcohol abuse in her maternal grandfather and paternal grandfather; Breast cancer in an other family member; Heart disease in her maternal grandmother and paternal grandfather; Hypertension in her father.  SOCIAL HISTORY:  reports that she quit smoking about 2 years ago. Her smoking use included cigarettes. She started smoking about 17 years ago. She has a 5.00 pack-year smoking history. She has never used smokeless tobacco. She reports current alcohol use. She reports that she does not use drugs.  ALLERGIES: Betadine [povidone iodine]  MEDICATIONS:  Current Outpatient Medications  Medication Sig Dispense Refill  . acetaminophen (TYLENOL) 500 MG tablet Take 1 tablet (500 mg total) by mouth every 6 (six) hours as needed. [place]  tablet 0  . amphetamine-dextroamphetamine (ADDERALL XR) 20 MG 24 hr capsule Take 20 mg by mouth daily.    . Ascorbic Acid (VITAMIN C) 1000 MG tablet Take 1,000 mg by mouth daily.    . clobetasol cream (TEMOVATE)  9.79 % Apply 1 application topically daily.    Marland Kitchen gabapentin (NEURONTIN) 300 MG capsule Take 1 capsule (300 mg total) by mouth at bedtime. 90 capsule 4  . hydrOXYzine (ATARAX/VISTARIL) 10 MG tablet Take 10 mg by mouth at bedtime.     . lidocaine-prilocaine (EMLA) cream Apply to affected area once 30 g 3  . LORazepam (ATIVAN) 0.5 MG tablet Take 1 tablet (0.5 mg total) by mouth at bedtime as needed (Nausea or vomiting). 30 tablet 0  . Multiple Vitamin (MULTIVITAMIN WITH MINERALS) TABS tablet Take 1 tablet by mouth daily.    . naproxen sodium (ALEVE) 220 MG tablet Take 1 tablet (220 mg total) by mouth 3 (three) times daily with meals. 60 tablet 3  . prochlorperazine (COMPAZINE) 10 MG tablet Take 1 tablet (10 mg total) by mouth every 6 (six) hours as needed (Nausea or vomiting). 30 tablet 1  . Tetrahydrozoline HCl (VISINE OP) Place 1 drop into both eyes daily as needed (redness).    . venlafaxine XR (EFFEXOR-XR) 37.5 MG 24 hr capsule Take 1 capsule (37.5 mg total) by mouth daily with breakfast. 30 capsule 0  . vitamin B-12 (CYANOCOBALAMIN) 1000 MCG tablet Take 1,000 mcg by mouth daily.     No current facility-administered medications for this encounter.    ECOG PERFORMANCE STATUS:  0 - Asymptomatic  REVIEW OF SYSTEMS: Patient denies any weight loss, fatigue, weakness, fever, chills or night sweats. Patient denies any loss of vision, blurred vision. Patient denies any ringing  of the ears or hearing loss. No irregular heartbeat. Patient denies heart murmur or history of fainting. Patient denies any chest pain or pain radiating to her upper extremities. Patient denies any shortness of breath, difficulty breathing at night, cough or hemoptysis. Patient denies any swelling in the lower legs. Patient denies any nausea vomiting, vomiting of blood, or coffee ground material in the vomitus. Patient denies any stomach pain. Patient states has had normal bowel movements no significant constipation or diarrhea.  Patient denies any dysuria, hematuria or significant nocturia. Patient denies any problems walking, swelling in the joints or loss of balance. Patient denies any skin changes, loss of hair or loss of weight. Patient denies any excessive worrying or anxiety or significant depression. Patient denies any problems with insomnia. Patient denies excessive thirst, polyuria, polydipsia. Patient denies any swollen glands, patient denies easy bruising or easy bleeding. Patient denies any recent infections, allergies or URI. Patient "s visual fields have not changed significantly in recent time.   PHYSICAL EXAM: BP (!) 127/94   Pulse (!) 113   Temp 98.7 F (37.1 C)   Wt 184 lb 9.6 oz (83.7 kg)   BMI 31.69 kg/m  Patient is status post bilateral breast reduction.  Difficult to ascertain the area of her prior lumpectomy scar.  No dominant mass or nodularity is noted in either breast in 2 positions examined.  No axillary or supraclavicular adenopathy is identified.  Well-developed well-nourished patient in NAD. HEENT reveals PERLA, EOMI, discs not visualized.  Oral cavity is clear. No oral mucosal lesions are identified. Neck is clear without evidence of cervical or supraclavicular adenopathy. Lungs are clear to A&P. Cardiac examination is essentially unremarkable with regular rate and rhythm without murmur rub or thrill.  Abdomen is benign with no organomegaly or masses noted. Motor sensory and DTR levels are equal and symmetric in the upper and lower extremities. Cranial nerves II through XII are grossly intact. Proprioception is intact. No peripheral adenopathy or edema is identified. No motor or sensory levels are noted. Crude visual fields are within normal range.  LABORATORY DATA: Pathology report reviewed    RADIOLOGY RESULTS: Mammogram and ultrasound reviewed compatible with above-stated findings   IMPRESSION: Stage IIb invasive mammary carcinoma of the right breast status post wide local excision and  sentinel node biopsy and adjuvant chemotherapy in 39 year old female  PLAN: At this time I would have recommended whole breast and peripheral emphatic radiation to her right breast.  Would plan on delivering 5040 cGy in 28 fractions.  Would also boost the area of her seroma another 1600 centigrade based on the close DCIS margin.  Risks and benefits of treatment including skin reaction fatigue alteration of blood counts possible inclusion of superficial lung slight possibility of lymphedema in her right upper extremity all were discussed in detail with the patient have asked her to exercise her right upper extremity is much as possible.  I have personally set up and ordered CT simulation after completion of her chemotherapy.  Patient also will benefit from antiestrogen therapy after completion of her radiation.  Patient comprehends my treatment plan well.  I would like to take this opportunity to thank you for allowing me to participate in the care of your patient.Noreene Filbert, MD

## 2019-05-18 ENCOUNTER — Inpatient Hospital Stay: Payer: BC Managed Care – PPO

## 2019-05-18 ENCOUNTER — Encounter: Payer: Self-pay | Admitting: Adult Health

## 2019-05-18 ENCOUNTER — Inpatient Hospital Stay (HOSPITAL_BASED_OUTPATIENT_CLINIC_OR_DEPARTMENT_OTHER): Payer: BC Managed Care – PPO | Admitting: Adult Health

## 2019-05-18 ENCOUNTER — Other Ambulatory Visit: Payer: Self-pay

## 2019-05-18 VITALS — BP 114/82 | HR 93 | Temp 99.6°F | Resp 18 | Ht 64.0 in | Wt 184.4 lb

## 2019-05-18 DIAGNOSIS — Z170 Estrogen receptor positive status [ER+]: Secondary | ICD-10-CM | POA: Diagnosis not present

## 2019-05-18 DIAGNOSIS — C50411 Malignant neoplasm of upper-outer quadrant of right female breast: Secondary | ICD-10-CM | POA: Diagnosis not present

## 2019-05-18 DIAGNOSIS — Z5111 Encounter for antineoplastic chemotherapy: Secondary | ICD-10-CM | POA: Diagnosis not present

## 2019-05-18 LAB — CBC WITH DIFFERENTIAL/PLATELET
Abs Immature Granulocytes: 0.03 10*3/uL (ref 0.00–0.07)
Basophils Absolute: 0 10*3/uL (ref 0.0–0.1)
Basophils Relative: 1 %
Eosinophils Absolute: 0.1 10*3/uL (ref 0.0–0.5)
Eosinophils Relative: 2 %
HCT: 38.7 % (ref 36.0–46.0)
Hemoglobin: 12.9 g/dL (ref 12.0–15.0)
Immature Granulocytes: 1 %
Lymphocytes Relative: 15 %
Lymphs Abs: 0.9 10*3/uL (ref 0.7–4.0)
MCH: 32 pg (ref 26.0–34.0)
MCHC: 33.3 g/dL (ref 30.0–36.0)
MCV: 96 fL (ref 80.0–100.0)
Monocytes Absolute: 0.5 10*3/uL (ref 0.1–1.0)
Monocytes Relative: 8 %
Neutro Abs: 4.5 10*3/uL (ref 1.7–7.7)
Neutrophils Relative %: 73 %
Platelets: 342 10*3/uL (ref 150–400)
RBC: 4.03 MIL/uL (ref 3.87–5.11)
RDW: 13 % (ref 11.5–15.5)
WBC: 6 10*3/uL (ref 4.0–10.5)
nRBC: 0 % (ref 0.0–0.2)

## 2019-05-18 LAB — COMPREHENSIVE METABOLIC PANEL
ALT: 38 U/L (ref 0–44)
AST: 23 U/L (ref 15–41)
Albumin: 4.1 g/dL (ref 3.5–5.0)
Alkaline Phosphatase: 62 U/L (ref 38–126)
Anion gap: 10 (ref 5–15)
BUN: 17 mg/dL (ref 6–20)
CO2: 26 mmol/L (ref 22–32)
Calcium: 10.1 mg/dL (ref 8.9–10.3)
Chloride: 102 mmol/L (ref 98–111)
Creatinine, Ser: 0.68 mg/dL (ref 0.44–1.00)
GFR calc Af Amer: >60 mL/min (ref >60)
GFR calc non Af Amer: >60 mL/min (ref >60)
Glucose, Bld: 99 mg/dL (ref 70–99)
Potassium: 4.1 mmol/L (ref 3.5–5.1)
Sodium: 138 mmol/L (ref 135–145)
Total Bilirubin: 0.3 mg/dL (ref 0.3–1.2)
Total Protein: 7.2 g/dL (ref 6.5–8.1)

## 2019-05-18 MED ORDER — HEPARIN SOD (PORK) LOCK FLUSH 100 UNIT/ML IV SOLN
500.0000 [IU] | Freq: Once | INTRAVENOUS | Status: AC | PRN
  Administered 2019-05-18: 500 [IU]
  Filled 2019-05-18: qty 5

## 2019-05-18 MED ORDER — DEXAMETHASONE SODIUM PHOSPHATE 10 MG/ML IJ SOLN
INTRAMUSCULAR | Status: AC
  Filled 2019-05-18: qty 1

## 2019-05-18 MED ORDER — DIPHENHYDRAMINE HCL 50 MG/ML IJ SOLN
INTRAMUSCULAR | Status: AC
  Filled 2019-05-18: qty 1

## 2019-05-18 MED ORDER — DIPHENHYDRAMINE HCL 50 MG/ML IJ SOLN
25.0000 mg | Freq: Once | INTRAMUSCULAR | Status: AC
  Administered 2019-05-18: 25 mg via INTRAVENOUS

## 2019-05-18 MED ORDER — SODIUM CHLORIDE 0.9 % IV SOLN
80.0000 mg/m2 | Freq: Once | INTRAVENOUS | Status: AC
  Administered 2019-05-18: 150 mg via INTRAVENOUS
  Filled 2019-05-18: qty 25

## 2019-05-18 MED ORDER — SODIUM CHLORIDE 0.9% FLUSH
10.0000 mL | INTRAVENOUS | Status: DC | PRN
  Administered 2019-05-18: 10 mL
  Filled 2019-05-18: qty 10

## 2019-05-18 MED ORDER — SODIUM CHLORIDE 0.9 % IV SOLN
Freq: Once | INTRAVENOUS | Status: AC
  Filled 2019-05-18: qty 250

## 2019-05-18 MED ORDER — FAMOTIDINE IN NACL 20-0.9 MG/50ML-% IV SOLN
INTRAVENOUS | Status: AC
  Filled 2019-05-18: qty 50

## 2019-05-18 MED ORDER — DEXAMETHASONE SODIUM PHOSPHATE 10 MG/ML IJ SOLN
4.0000 mg | Freq: Once | INTRAMUSCULAR | Status: AC
  Administered 2019-05-18: 4 mg via INTRAVENOUS

## 2019-05-18 MED ORDER — FAMOTIDINE IN NACL 20-0.9 MG/50ML-% IV SOLN
20.0000 mg | Freq: Once | INTRAVENOUS | Status: AC
  Administered 2019-05-18: 20 mg via INTRAVENOUS

## 2019-05-18 NOTE — Progress Notes (Signed)
[facility]  [phone] [phone]     ID: Kaylee Romero DOB: [birth year]  MR#: [id number]  MGQ#:[id number]  Patient Care Team: Linda Hedges, DO as PCP - General (Obstetrics and Gynecology) Mauro Kaufmann, RN as Oncology Nurse Navigator Rockwell Germany, RN as Oncology Nurse Navigator Gery Pray, MD as Consulting Physician (Radiation Oncology) Magrinat, Virgie Dad, MD as Consulting Physician (Oncology) Jovita Kussmaul, MD as Consulting Physician (General Surgery) Dillingham, Loel Lofty, DO as Attending Physician (Plastic Surgery) Noreene Filbert, MD as Radiation Oncologist (Radiation Oncology) Scot Dock, NP OTHER MD:  CHIEF COMPLAINT: estrogen receptor positive breast cancer  CURRENT TREATMENT: Adjuvant chemotherapy   INTERVAL HISTORY: Kaylee Romero  returns today for follow-up and treatment of her estrogen receptor positive breast cancer.   She continues weekly Paclitaxel.  Today is week 10 of treatment.  She is feeling much better this week.  She met with Dr. Baruch Gouty in radiation oncology.  She is going to start radiation in early June and receive this for 7 weeks.    REVIEW OF SYSTEMS: Kaylee Romero has no peripheral neuropathy.  She does note that she has some cramping in her right hand, but otherwise is feeling well.  She has remained on a low carb diet and has plateaued with her weight loss.  She recently went to the beach with her friends, and had a great time.  Kaylee Romero denies any fever, chills, chest pain, palpitations, cough, shortness of breath, headaches, vision issues, bowel/bladder changes, nausea, vomiting, mucositis, skin issues, or any other concerns.  A detailed ROS was otherwise non contributory.   HISTORY OF CURRENT ILLNESS: From the original intake note:  Kaylee B Schoppe (pronounced "Bram-Hall") found a palpable lump in the 12 o'clock position of the right breast, which he brought to the attention of her gynecologist.. She underwent  bilateral diagnostic mammography with tomography and bilateral breast ultrasonography at [facility] on 09/23/2018 showing: breast density category C; a spiculated mass with associated with architectural distortion and pleomorphic calcifications lying in the 11-12 o'clock position, middle depth, measuring 2 cm in size, corresponding to the palpable abnormality.   Physical exam confirmed a firm but mobile mass in the 12 o'clock position of the right breast.  By ultrasound there was a highly suspicious 2.1 cm mass in the 12 o'clock position of the right breast; no other evidence of breast malignancy or lymphadenopathy bilaterally.Sonographic evaluation of the right axilla found no enlarged or abnormal appearing lymph node  In the left axilla there were several lymph nodes with areas of cortical prominence similar to the lymph node noted on the right.  Given the symmetry all these are presumed to be reactive  Accordingly on 09/26/2018 she proceeded to biopsy of the right breast area in question. The pathology from this procedure ([id number]) showed: invasive ductal carcinoma, grade 2; prognostic indicators significant for: estrogen receptor, 80% positive with moderate staining intensity and progesterone receptor, 90% positive with strong staining intensity. Proliferation marker Ki67 at 35%. HER2 negative by immunohistochemistry (1+).  The patient's subsequent history is as detailed below.   PAST MEDICAL HISTORY: Past Medical History:  Diagnosis Date  . Anxiety   . Family history of breast cancer   . HSV (herpes simplex virus) anogenital infection    positive titer only    PAST SURGICAL HISTORY: Past Surgical History:  Procedure Laterality Date  . BREAST LUMPECTOMY WITH AXILLARY LYMPH NODE BIOPSY Right 11/21/2018   Procedure: RIGHT BREAST LUMPECTOMY WITH SENTINEL LYMPH NODE  BIOPSY;  Surgeon: Jovita Kussmaul, MD;  Location: [facility];  Service: General;  Laterality: Right;  . BREAST REDUCTION  SURGERY Bilateral 11/21/2018   Procedure: BILATERAL MAMMARY REDUCTION  (BREAST);  Surgeon: Wallace Going, DO;  Location: [facility];  Service: Plastics;  Laterality: Bilateral;  . PORTACATH PLACEMENT N/A 11/21/2018   Procedure: INSERTION LEFT PORT-A-CATH WITH ULTRASOUND GUIDANCE;  Surgeon: Jovita Kussmaul, MD;  Location: [facility];  Service: General;  Laterality: N/A;  . TONSILLECTOMY  1987  . TONSILLECTOMY    . WISDOM TOOTH EXTRACTION      FAMILY HISTORY: Family History  Problem Relation Age of Onset  . Hypertension Father   . Alcohol abuse Paternal Grandfather   . Heart disease Paternal Grandfather   . Alcohol abuse Maternal Grandfather   . Heart disease Maternal Grandmother   . Breast cancer Other    Patient's parents are both living at age 23, as of 09/2018. Her father lives in [place], and her mother lives in [place], [place]. The patient denies a family hx of ovarian cancer. A maternal great aunt was diagnosed with breast cancer twice. She has 2 brothers, no sisters   GYNECOLOGIC HISTORY:  No LMP recorded. (Menstrual status: Chemotherapy). Menarche: 39 years old Age at first live birth: 39 years old [place] P 1 Currently having regular periods  Contraceptive: Mirena IUD in place HRT n/a  Hysterectomy? no BSO? no   SOCIAL HISTORY: (updated January 2021) Kaylee Romero is a [employer]. Husband Kaylee Romero is in management with [employer]. Daughter Normand Sloop is 56 months old.     ADVANCED DIRECTIVES: not in place. Her husband is automatically her HCPOA.   HEALTH MAINTENANCE: Social History   Tobacco Use  . Smoking status: Former Smoker    Packs/day: 0.50    Years: 10.00    Pack years: 5.00    Types: Cigarettes    Start date: 01/13/2002    Quit date: 10/17/2016    Years since quitting: 2.5  . Smokeless tobacco: Never Used  Substance Use Topics  . Alcohol use: Yes    Comment: Socially  . Drug use: No     Colonoscopy: n/a  PAP: 09/20/2018  Bone density: n/a   Allergies   Allergen Reactions  . Betadine [Povidone Iodine] Rash    Current Outpatient Medications  Medication Sig Dispense Refill  . acetaminophen (TYLENOL) 500 MG tablet Take 1 tablet (500 mg total) by mouth every 6 (six) hours as needed. 60 tablet 0  . amphetamine-dextroamphetamine (ADDERALL XR) 20 MG 24 hr capsule Take 20 mg by mouth daily.    . Ascorbic Acid (VITAMIN C) 1000 MG tablet Take 1,000 mg by mouth daily.    . clobetasol cream (TEMOVATE) 1.96 % Apply 1 application topically daily.    Marland Kitchen gabapentin (NEURONTIN) 300 MG capsule Take 1 capsule (300 mg total) by mouth at bedtime. 90 capsule 4  . hydrOXYzine (ATARAX/VISTARIL) 10 MG tablet Take 10 mg by mouth at bedtime.     . lidocaine-prilocaine (EMLA) cream Apply to affected area once 30 g 3  . LORazepam (ATIVAN) 0.5 MG tablet Take 1 tablet (0.5 mg total) by mouth at bedtime as needed (Nausea or vomiting). 30 tablet 0  . Multiple Vitamin (MULTIVITAMIN WITH MINERALS) TABS tablet Take 1 tablet by mouth daily.    . naproxen sodium (ALEVE) 220 MG tablet Take 1 tablet (220 mg total) by mouth 3 (three) times daily with meals. 60 tablet 3  . Tetrahydrozoline HCl (VISINE OP) Place 1 drop  into both eyes daily as needed (redness).    . venlafaxine XR (EFFEXOR-XR) 37.5 MG 24 hr capsule Take 1 capsule (37.5 mg total) by mouth daily with breakfast. 30 capsule 0  . vitamin B-12 (CYANOCOBALAMIN) 1000 MCG tablet Take 1,000 mcg by mouth daily.    . prochlorperazine (COMPAZINE) 10 MG tablet Take 1 tablet (10 mg total) by mouth every 6 (six) hours as needed (Nausea or vomiting). (Patient not taking: Reported on 05/18/2019) 30 tablet 1   No current facility-administered medications for this visit.   Facility-Administered Medications Ordered in Other Visits  Medication Dose Route Frequency Provider Last Rate Last Admin  . sodium chloride flush (NS) 0.9 % injection 10 mL  10 mL Intracatheter PRN Magrinat, Virgie Dad, MD   10 mL at 05/18/19 1553     OBJECTIVE:  Vitals:   05/18/19 1321  BP: 114/82  Pulse: 93  Resp: 18  Temp: 99.6 F (37.6 C)  SpO2: 100%   Wt Readings from Last 3 Encounters:  05/18/19 184 lb 6.4 oz (83.6 kg)  05/17/19 184 lb 9.6 oz (83.7 kg)  05/03/19 182 lb 12.8 oz (82.9 kg)   Body mass index is 31.65 kg/m.    ECOG FS:1 - Symptomatic but completely ambulatory GENERAL: Patient is a well appearing female in no acute distress HEENT:  Sclerae anicteric. Mask in place. Neck is supple.  NODES:  No cervical, supraclavicular, or axillary lymphadenopathy palpated.  BREAST EXAM: deferred today. LUNGS:  Clear to auscultation bilaterally.  No wheezes or rhonchi. HEART:  Regular rate and rhythm. No murmur appreciated. ABDOMEN:  Soft, nontender.  Positive, normoactive bowel sounds. No organomegaly palpated. MSK:  No focal spinal tenderness to palpation. Full range of motion bilaterally in the upper extremities. EXTREMITIES:  No peripheral edema.   SKIN:  Clear with no obvious rashes or skin changes. No nail dyscrasia. NEURO:  Nonfocal. Well oriented.  Appropriate affect.     LAB RESULTS:  CMP     Component Value Date/Time   NA 138 05/18/2019 1248   K 4.1 05/18/2019 1248   CL 102 05/18/2019 1248   CO2 26 05/18/2019 1248   GLUCOSE 99 05/18/2019 1248   BUN 17 05/18/2019 1248   CREATININE 0.68 05/18/2019 1248   CREATININE 0.68 10/05/2018 1244   CALCIUM 10.1 05/18/2019 1248   PROT 7.2 05/18/2019 1248   ALBUMIN 4.1 05/18/2019 1248   AST 23 05/18/2019 1248   AST 15 10/05/2018 1244   ALT 38 05/18/2019 1248   ALT 18 10/05/2018 1244   ALKPHOS 62 05/18/2019 1248   BILITOT 0.3 05/18/2019 1248   BILITOT 0.5 10/05/2018 1244   GFRNONAA >60 05/18/2019 1248   GFRNONAA >60 10/05/2018 1244   GFRAA >60 05/18/2019 1248   GFRAA >60 10/05/2018 1244    No results found for: TOTALPROTELP, ALBUMINELP, A1GS, A2GS, BETS, BETA2SER, GAMS, MSPIKE, SPEI  No results found for: KPAFRELGTCHN, LAMBDASER, KAPLAMBRATIO  Lab  Results  Component Value Date   WBC 6.0 05/18/2019   NEUTROABS 4.5 05/18/2019   HGB 12.9 05/18/2019   HCT 38.7 05/18/2019   MCV 96.0 05/18/2019   PLT 342 05/18/2019    No results found for: LABCA2  No components found for: JOACZY606  No results for input(s): INR in the last 168 hours.  No results found for: LABCA2  No results found for: TKZ601  No results found for: UXN235  No results found for: TDD220  No results found for: CA2729  No components found for: HGQUANT  No  results found for: CEA1 / No results found for: CEA1   No results found for: AFPTUMOR  No results found for: CHROMOGRNA  No results found for: HGBA, HGBA2QUANT, HGBFQUANT, HGBSQUAN (Hemoglobinopathy evaluation)   No results found for: LDH  No results found for: IRON, TIBC, IRONPCTSAT (Iron and TIBC)  No results found for: FERRITIN  Urinalysis    Component Value Date/Time   BILIRUBINUR Neg 01/18/2012 1458   PROTEINUR Neg 01/18/2012 1458   UROBILINOGEN 0.2 01/18/2012 1458   NITRITE Neg 01/18/2012 1458   LEUKOCYTESUR Negative 01/18/2012 1458     STUDIES: No results found.   ELIGIBLE FOR AVAILABLE RESEARCH PROTOCOL: no  ASSESSMENT: 39 y.o. [place] woman status post right breast upper inner quadrant biopsy on 09/26/2018 for a clinical T2 N0, stage IB invasive ductal carcinoma, grade 2, estrogen and progesterone receptor positive, HER-2 nonamplified, with an MIB-1-1 of 35%  (1) Genetic testing reported on 10/05/2018 through the [facility] Breast Cancer STAT Panel + Common Hereditary cancer panel found no pathogenic mutations. The STAT Breast cancer panel offered by [facility] includes sequencing and rearrangement analysis for the following 9 genes:  ATM, BRCA1, BRCA2, CDH1, CHEK2, PALB2, PTEN, STK11 and TP53. The Common Hereditary Cancers Panel offered by [facility] includes sequencing and/or deletion duplication testing of the following 47 genes: APC, ATM, AXIN2, BARD1, BMPR1A, BRCA1, BRCA2, BRIP1,  CDH1, CDKN2A (p14ARF), CDKN2A (p16INK4a), CKD4, CHEK2, CTNNA1, DICER1, EPCAM (Deletion/duplication testing only), GREM1 (promoter region deletion/duplication testing only), KIT, MEN1, MLH1, MSH2, MSH3, MSH6, MUTYH, NBN, NF1, NHTL1, PALB2, PDGFRA, PMS2, POLD1, POLE, PTEN, RAD50, RAD51C, RAD51D, SDHB, SDHC, SDHD, SMAD4, SMARCA4. STK11, TP53, TSC1, TSC2, and VHL.  The following genes were evaluated for sequence changes only: SDHA and HOXB13 c.251G>A variant only.   (2) MammaPrint obtained from the original breast biopsy returned "high risk" indicating the patient will have an excellent prognosis if she receives adjuvant chemotherapy  (3) status post right lumpectomy and right axillary sentinel lymph node sampling 11/21/2018 for a pT2 pN1, stage IIA invasive ductal carcinoma, grade 2, with negative margins  (a) a total of 4 sentinel lymph nodes removed, 1 with macrometastasis  (b) status post bilateral breast reductions at the time of lumpectomy  (4) adjuvant chemotherapy consisting of cyclophosphamide and doxorubicin in dose dense fashion x4 started 01/05/2019, completed 02/15/2019, followed by weekly paclitaxel x12 started 03/02/2019  (a) echo 10/27/2018 shows an ejection fraction in the 55-60% range  (5) adjuvant radiation to follow, in [place] per patient's request  (6) tamoxifen started 10/05/2018 in anticipation of surgical delays, discontinued 12/05/2018   PLAN: I[place]na is doing well today and continues on weekly Paclitaxel with good tolerance.  She has not developed any peripheral neuropathy, and we are monitoring her closely for this.    She has noted some musculoskeletal issues with cramping and aching, worse in her right hand and first thing in the morning.  She has no h/o carpal tunnel.  I suggested that she consider getting a wrist brace to wear, since she is right handed to see if that would help.  [facility] has received her schedule for radiation with Dr. Baruch Gouty.  She and I reviewed what  it means to have negative margins.  She will see him on 6/7 to undergo simulation and get her radiation therapy plan.  Eliette will return in one week for labs, f/u with Dr. Jana Hakim, and cycle 11 of her adjuvant Paclitaxel.  She was recommended to continue with the appropriate pandemic precautions. She knows to call for any questions  that may arise between now and her next appointment.  We are happy to see her sooner if needed.    Total encounter time: 20 minutes*  Wilber Bihari, NP 05/19/19 9:34 AM Medical Oncology and Hematology [facility] [address] [place], [place] [address] Tel. [phone]    Fax. [phone]     *Total Encounter Time as defined by the Centers for Medicare and Medicaid Services includes, in addition to the face-to-face time of a patient visit (documented in the note above) non-face-to-face time: obtaining and reviewing outside history, ordering and reviewing medications, tests or procedures, care coordination (communications with other health care professionals or caregivers) and documentation in the medical record.

## 2019-05-18 NOTE — Patient Instructions (Signed)
[facility] Discharge Instructions for Patients Receiving Chemotherapy  Today you received the following chemotherapy agents:  Taxol.  To help prevent nausea and vomiting after your treatment, we encourage you to take your nausea medication as directed.   If you develop nausea and vomiting that is not controlled by your nausea medication, call the clinic.   BELOW ARE SYMPTOMS THAT SHOULD BE REPORTED IMMEDIATELY:  *FEVER GREATER THAN 100.5 F  *CHILLS WITH OR WITHOUT FEVER  NAUSEA AND VOMITING THAT IS NOT CONTROLLED WITH YOUR NAUSEA MEDICATION  *UNUSUAL SHORTNESS OF BREATH  *UNUSUAL BRUISING OR BLEEDING  TENDERNESS IN MOUTH AND THROAT WITH OR WITHOUT PRESENCE OF ULCERS  *URINARY PROBLEMS  *BOWEL PROBLEMS  UNUSUAL RASH Items with * indicate a potential emergency and should be followed up as soon as possible.  Feel free to call the clinic should you have any questions or concerns. The clinic phone number is [phone].  Please show the CHEMO ALERT CARD at check-in to the Emergency Department and triage nurse.   

## 2019-05-19 ENCOUNTER — Telehealth: Payer: Self-pay | Admitting: Adult Health

## 2019-05-19 NOTE — Progress Notes (Signed)
Pharmacist Chemotherapy Monitoring - Follow Up Assessment    I verify that I have reviewed each item in the below checklist:  . Regimen for the patient is scheduled for the appropriate day and plan matches scheduled date. Marland Kitchen Appropriate non-routine labs are ordered dependent on drug ordered. . If applicable, additional medications reviewed and ordered per protocol based on lifetime cumulative doses and/or treatment regimen.   Plan for follow-up and/or issues identified: No . I-vent associated with next due treatment: No . MD and/or nursing notified: No  Romualdo Bolk [facility] 05/19/2019 8:41 AM

## 2019-05-19 NOTE — Telephone Encounter (Signed)
No 5/13 los. No changes made to pt's schedule.  

## 2019-05-22 ENCOUNTER — Other Ambulatory Visit: Payer: Self-pay | Admitting: Adult Health

## 2019-05-22 DIAGNOSIS — Z170 Estrogen receptor positive status [ER+]: Secondary | ICD-10-CM

## 2019-05-25 ENCOUNTER — Other Ambulatory Visit: Payer: Self-pay

## 2019-05-25 ENCOUNTER — Encounter: Payer: Self-pay | Admitting: *Deleted

## 2019-05-25 ENCOUNTER — Inpatient Hospital Stay: Payer: BC Managed Care – PPO

## 2019-05-25 ENCOUNTER — Inpatient Hospital Stay (HOSPITAL_BASED_OUTPATIENT_CLINIC_OR_DEPARTMENT_OTHER): Payer: BC Managed Care – PPO | Admitting: Oncology

## 2019-05-25 VITALS — BP 147/59 | HR 106 | Temp 98.9°F | Resp 20 | Ht 64.0 in | Wt 184.1 lb

## 2019-05-25 VITALS — HR 92

## 2019-05-25 DIAGNOSIS — F988 Other specified behavioral and emotional disorders with onset usually occurring in childhood and adolescence: Secondary | ICD-10-CM | POA: Diagnosis not present

## 2019-05-25 DIAGNOSIS — C50411 Malignant neoplasm of upper-outer quadrant of right female breast: Secondary | ICD-10-CM

## 2019-05-25 DIAGNOSIS — Z5111 Encounter for antineoplastic chemotherapy: Secondary | ICD-10-CM | POA: Diagnosis not present

## 2019-05-25 DIAGNOSIS — Z170 Estrogen receptor positive status [ER+]: Secondary | ICD-10-CM

## 2019-05-25 DIAGNOSIS — Z95828 Presence of other vascular implants and grafts: Secondary | ICD-10-CM

## 2019-05-25 LAB — CBC WITH DIFFERENTIAL/PLATELET
Abs Immature Granulocytes: 0.05 10*3/uL (ref 0.00–0.07)
Basophils Absolute: 0.1 10*3/uL (ref 0.0–0.1)
Basophils Relative: 1 %
Eosinophils Absolute: 0.1 10*3/uL (ref 0.0–0.5)
Eosinophils Relative: 1 %
HCT: 37.2 % (ref 36.0–46.0)
Hemoglobin: 12.6 g/dL (ref 12.0–15.0)
Immature Granulocytes: 1 %
Lymphocytes Relative: 11 %
Lymphs Abs: 1.1 10*3/uL (ref 0.7–4.0)
MCH: 31.7 pg (ref 26.0–34.0)
MCHC: 33.9 g/dL (ref 30.0–36.0)
MCV: 93.5 fL (ref 80.0–100.0)
Monocytes Absolute: 0.5 10*3/uL (ref 0.1–1.0)
Monocytes Relative: 6 %
Neutro Abs: 7.4 10*3/uL (ref 1.7–7.7)
Neutrophils Relative %: 80 %
Platelets: 319 10*3/uL (ref 150–400)
RBC: 3.98 MIL/uL (ref 3.87–5.11)
RDW: 13.2 % (ref 11.5–15.5)
WBC: 9.2 10*3/uL (ref 4.0–10.5)
nRBC: 0 % (ref 0.0–0.2)

## 2019-05-25 LAB — COMPREHENSIVE METABOLIC PANEL
ALT: 30 U/L (ref 0–44)
AST: 18 U/L (ref 15–41)
Albumin: 4 g/dL (ref 3.5–5.0)
Alkaline Phosphatase: 67 U/L (ref 38–126)
Anion gap: 11 (ref 5–15)
BUN: 22 mg/dL — ABNORMAL HIGH (ref 6–20)
CO2: 26 mmol/L (ref 22–32)
Calcium: 9.9 mg/dL (ref 8.9–10.3)
Chloride: 102 mmol/L (ref 98–111)
Creatinine, Ser: 0.69 mg/dL (ref 0.44–1.00)
GFR calc Af Amer: >60 mL/min (ref >60)
GFR calc non Af Amer: >60 mL/min (ref >60)
Glucose, Bld: 97 mg/dL (ref 70–99)
Potassium: 4.2 mmol/L (ref 3.5–5.1)
Sodium: 139 mmol/L (ref 135–145)
Total Bilirubin: 0.3 mg/dL (ref 0.3–1.2)
Total Protein: 7.1 g/dL (ref 6.5–8.1)

## 2019-05-25 MED ORDER — SODIUM CHLORIDE 0.9 % IV SOLN
80.0000 mg/m2 | Freq: Once | INTRAVENOUS | Status: AC
  Administered 2019-05-25: 150 mg via INTRAVENOUS
  Filled 2019-05-25: qty 25

## 2019-05-25 MED ORDER — SODIUM CHLORIDE 0.9% FLUSH
10.0000 mL | Freq: Once | INTRAVENOUS | Status: AC
  Administered 2019-05-25: 10 mL
  Filled 2019-05-25: qty 10

## 2019-05-25 MED ORDER — HEPARIN SOD (PORK) LOCK FLUSH 100 UNIT/ML IV SOLN
500.0000 [IU] | Freq: Once | INTRAVENOUS | Status: AC | PRN
  Administered 2019-05-25: 500 [IU]
  Filled 2019-05-25: qty 5

## 2019-05-25 MED ORDER — FAMOTIDINE IN NACL 20-0.9 MG/50ML-% IV SOLN
20.0000 mg | Freq: Once | INTRAVENOUS | Status: AC
  Administered 2019-05-25: 20 mg via INTRAVENOUS

## 2019-05-25 MED ORDER — FAMOTIDINE IN NACL 20-0.9 MG/50ML-% IV SOLN
INTRAVENOUS | Status: AC
  Filled 2019-05-25: qty 50

## 2019-05-25 MED ORDER — SODIUM CHLORIDE 0.9% FLUSH
10.0000 mL | INTRAVENOUS | Status: DC | PRN
  Administered 2019-05-25: 10 mL
  Filled 2019-05-25: qty 10

## 2019-05-25 MED ORDER — SODIUM CHLORIDE 0.9 % IV SOLN
Freq: Once | INTRAVENOUS | Status: AC
  Filled 2019-05-25: qty 250

## 2019-05-25 MED ORDER — DEXAMETHASONE SODIUM PHOSPHATE 10 MG/ML IJ SOLN
4.0000 mg | Freq: Once | INTRAMUSCULAR | Status: AC
  Administered 2019-05-25: 4 mg via INTRAVENOUS

## 2019-05-25 MED ORDER — DIPHENHYDRAMINE HCL 50 MG/ML IJ SOLN
INTRAMUSCULAR | Status: AC
  Filled 2019-05-25: qty 1

## 2019-05-25 MED ORDER — DEXAMETHASONE SODIUM PHOSPHATE 10 MG/ML IJ SOLN
INTRAMUSCULAR | Status: AC
  Filled 2019-05-25: qty 1

## 2019-05-25 MED ORDER — DIPHENHYDRAMINE HCL 50 MG/ML IJ SOLN
25.0000 mg | Freq: Once | INTRAMUSCULAR | Status: AC
  Administered 2019-05-25: 25 mg via INTRAVENOUS

## 2019-05-25 NOTE — Patient Instructions (Signed)
[facility] Discharge Instructions for Patients Receiving Chemotherapy  Today you received the following chemotherapy agents Paclitaxel.   To help prevent nausea and vomiting after your treatment, we encourage you to take your nausea medication as prescribed.   If you develop nausea and vomiting that is not controlled by your nausea medication, call the clinic.   BELOW ARE SYMPTOMS THAT SHOULD BE REPORTED IMMEDIATELY:  *FEVER GREATER THAN 100.5 F  *CHILLS WITH OR WITHOUT FEVER  NAUSEA AND VOMITING THAT IS NOT CONTROLLED WITH YOUR NAUSEA MEDICATION  *UNUSUAL SHORTNESS OF BREATH  *UNUSUAL BRUISING OR BLEEDING  TENDERNESS IN MOUTH AND THROAT WITH OR WITHOUT PRESENCE OF ULCERS  *URINARY PROBLEMS  *BOWEL PROBLEMS  UNUSUAL RASH Items with * indicate a potential emergency and should be followed up as soon as possible.  Feel free to call the clinic should you have any questions or concerns. The clinic phone number is [phone].  Please show the CHEMO ALERT CARD at check-in to the Emergency Department and triage nurse. 

## 2019-05-25 NOTE — Progress Notes (Signed)
[place]  [phone]) [phone] [phone]     ID: Kaylee Romero DOB: 06-11-1980  MR#: [id number]  VEL#:[id number]  Patient Care Team: Linda Hedges, DO as PCP - General (Obstetrics and Gynecology) Mauro Kaufmann, RN as Oncology Nurse Navigator Rockwell Germany, RN as Oncology Nurse Navigator Gery Pray, MD as Consulting Physician (Radiation Oncology) Taleigh Gero, Virgie Dad, MD as Consulting Physician (Oncology) Jovita Kussmaul, MD as Consulting Physician (General Surgery) Dillingham, Loel Lofty, DO as Attending Physician (Plastic Surgery) Noreene Filbert, MD as Radiation Oncologist (Radiation Oncology) Chauncey Cruel, MD OTHER MD:  CHIEF COMPLAINT: estrogen receptor positive breast cancer  CURRENT TREATMENT: Adjuvant chemotherapy   INTERVAL HISTORY: Hortensia  returns today for follow-up and treatment of her estrogen receptor positive breast cancer.   She continues weekly Paclitaxel.  Today is week 11 of treatment.  She is feeling much better this week.  She met with Dr. Baruch Gouty in radiation oncology.  She is going to start radiation in early June and receive this for 7 weeks.    REVIEW OF SYSTEMS: Kaylee Romero had to put her dog down week ago. This was very sad. The dog very likely had cancer involving the spine. Care was started on venlafaxine at very low dose. It is making her feel a little strange and there may be some interactions with the Adderall. I suggested she contact her psychologist and see if that is an issue. She is taking walks 3 to 4 days a week mostly in the park with her daughter. She has not had a menstrual period for the last 6 months. A detailed review of systems today was otherwise stable   HISTORY OF CURRENT ILLNESS: From the original intake note:  Kaylee Romero (pronounced "Bram-Hall") found a palpable lump in the 12 o'clock position of the right breast, which he brought to the attention of her gynecologist.. She underwent bilateral  diagnostic mammography with tomography and bilateral breast ultrasonography at [facility] on 09/23/2018 showing: breast density category C; a spiculated mass with associated with architectural distortion and pleomorphic calcifications lying in the 11-12 o'clock position, middle depth, measuring 2 cm in size, corresponding to the palpable abnormality.   Physical exam confirmed a firm but mobile mass in the 12 o'clock position of the right breast.  By ultrasound there was a highly suspicious 2.1 cm mass in the 12 o'clock position of the right breast; no other evidence of breast malignancy or lymphadenopathy bilaterally.Sonographic evaluation of the right axilla found no enlarged or abnormal appearing lymph node  In the left axilla there were several lymph nodes with areas of cortical prominence similar to the lymph node noted on the right.  Given the symmetry all these are presumed to be reactive  Accordingly on 09/26/2018 she proceeded to biopsy of the right breast area in question. The pathology from this procedure ([id number]) showed: invasive ductal carcinoma, grade 2; prognostic indicators significant for: estrogen receptor, 80% positive with moderate staining intensity and progesterone receptor, 90% positive with strong staining intensity. Proliferation marker Ki67 at 35%. HER2 negative by immunohistochemistry (1+).  The patient's subsequent history is as detailed below.   PAST MEDICAL HISTORY: Past Medical History:  Diagnosis Date  . Anxiety   . Family history of breast cancer   . HSV (herpes simplex virus) anogenital infection    positive titer only    PAST SURGICAL HISTORY: Past Surgical History:  Procedure Laterality Date  . BREAST LUMPECTOMY WITH AXILLARY LYMPH NODE BIOPSY Right 11/21/2018  Procedure: RIGHT BREAST LUMPECTOMY WITH SENTINEL LYMPH NODE BIOPSY;  Surgeon: Jovita Kussmaul, MD;  Location: [place];  Service: General;  Laterality: Right;  . BREAST REDUCTION SURGERY  Bilateral 11/21/2018   Procedure: BILATERAL MAMMARY REDUCTION  (BREAST);  Surgeon: Wallace Going, DO;  Location: [place];  Service: Plastics;  Laterality: Bilateral;  . PORTACATH PLACEMENT N/A 11/21/2018   Procedure: INSERTION LEFT PORT-A-CATH WITH ULTRASOUND GUIDANCE;  Surgeon: Jovita Kussmaul, MD;  Location: [place];  Service: General;  Laterality: N/A;  . TONSILLECTOMY  1987  . TONSILLECTOMY    . WISDOM TOOTH EXTRACTION      FAMILY HISTORY: Family History  Problem Relation Age of Onset  . Hypertension Father   . Alcohol abuse Paternal Grandfather   . Heart disease Paternal Grandfather   . Alcohol abuse Maternal Grandfather   . Heart disease Maternal Grandmother   . Breast cancer Other    Patient's parents are both living at age 60, as of 09/2018. Her father lives in [place], and her mother lives in [place], [place]. The patient denies a family hx of ovarian cancer. A maternal great aunt was diagnosed with breast cancer twice. She has 2 brothers, no sisters   GYNECOLOGIC HISTORY:  No LMP recorded. (Menstrual status: Chemotherapy). Menarche: 39 years old Age at first live birth: 39 years old [place] P 1 Currently having regular periods  Contraceptive: Mirena IUD in place HRT n/a  Hysterectomy? no BSO? no   SOCIAL HISTORY: (updated January 2021) Synthia is a [employer]. Husband Tommi Rumps is in management with [employer]. Daughter Normand Sloop is 58 months old.     ADVANCED DIRECTIVES: not in place. Her husband is automatically her HCPOA.   HEALTH MAINTENANCE: Social History   Tobacco Use  . Smoking status: Former Smoker    Packs/day: 0.50    Years: 10.00    Pack years: 5.00    Types: Cigarettes    Start date: 01/13/2002    Quit date: 10/17/2016    Years since quitting: 2.6  . Smokeless tobacco: Never Used  Substance Use Topics  . Alcohol use: Yes    Comment: Socially  . Drug use: No     Colonoscopy: n/a  PAP: 09/20/2018  Bone density: n/a   Allergies  Allergen  Reactions  . Betadine [Povidone Iodine] Rash    Current Outpatient Medications  Medication Sig Dispense Refill  . acetaminophen (TYLENOL) 500 MG tablet Take 1 tablet (500 mg total) by mouth every 6 (six) hours as needed. 60 tablet 0  . amphetamine-dextroamphetamine (ADDERALL XR) 20 MG 24 hr capsule Take 20 mg by mouth daily.    . Ascorbic Acid (VITAMIN C) 1000 MG tablet Take 1,000 mg by mouth daily.    . clobetasol cream (TEMOVATE) 0.94 % Apply 1 application topically daily.    Marland Kitchen gabapentin (NEURONTIN) 300 MG capsule Take 1 capsule (300 mg total) by mouth at bedtime. 90 capsule 4  . hydrOXYzine (ATARAX/VISTARIL) 10 MG tablet Take 10 mg by mouth at bedtime.     . lidocaine-prilocaine (EMLA) cream Apply to affected area once 30 g 3  . LORazepam (ATIVAN) 0.5 MG tablet Take 1 tablet (0.5 mg total) by mouth at bedtime as needed (Nausea or vomiting). 30 tablet 0  . Multiple Vitamin (MULTIVITAMIN WITH MINERALS) TABS tablet Take 1 tablet by mouth daily.    . naproxen sodium (ALEVE) 220 MG tablet Take 1 tablet (220 mg total) by mouth 3 (three) times daily with meals. 60 tablet 3  .  prochlorperazine (COMPAZINE) 10 MG tablet Take 1 tablet (10 mg total) by mouth every 6 (six) hours as needed (Nausea or vomiting). (Patient not taking: Reported on 05/18/2019) 30 tablet 1  . Tetrahydrozoline HCl (VISINE OP) Place 1 drop into both eyes daily as needed (redness).    . venlafaxine XR (EFFEXOR-XR) 37.5 MG 24 hr capsule TAKE 1 CAPSULE BY MOUTH DAILY WITH BREAKFAST. 30 capsule 0  . vitamin B-12 (CYANOCOBALAMIN) 1000 MCG tablet Take 1,000 mcg by mouth daily.     No current facility-administered medications for this visit.   Facility-Administered Medications Ordered in Other Visits  Medication Dose Route Frequency Provider Last Rate Last Admin  . sodium chloride flush (NS) 0.9 % injection 10 mL  10 mL Intracatheter PRN Brek Reece, Virgie Dad, MD   10 mL at 05/18/19 1553    OBJECTIVE: White woman in no acute  distress  Vitals:   05/25/19 1328  BP: (!) 147/59  Pulse: (!) 106  Resp: 20  Temp: 98.9 F (37.2 C)  SpO2: 100%   Wt Readings from Last 3 Encounters:  05/25/19 184 lb 1.6 oz (83.5 kg)  05/18/19 184 lb 6.4 oz (83.6 kg)  05/17/19 184 lb 9.6 oz (83.7 kg)   Body mass index is 31.6 kg/m.    ECOG FS:1 - Symptomatic but completely ambulatory  Sclerae unicteric, EOMs intact Wearing a mask No cervical or supraclavicular adenopathy Lungs no rales or rhonchi Heart regular rate and rhythm Abd soft, nontender, positive bowel sounds MSK no focal spinal tenderness, no upper extremity lymphedema Neuro: nonfocal, well oriented, appropriate affect Breasts: The breasts are status post bilateral reduction mammoplasty with very good cosmetic result. The right breast is also status post lumpectomy. There is no evidence of local recurrence. Both axillae are benign.   LAB RESULTS:  CMP     Component Value Date/Time   NA 139 05/25/2019 1320   K 4.2 05/25/2019 1320   CL 102 05/25/2019 1320   CO2 26 05/25/2019 1320   GLUCOSE 97 05/25/2019 1320   BUN 22 (H) 05/25/2019 1320   CREATININE 0.69 05/25/2019 1320   CREATININE 0.68 10/05/2018 1244   CALCIUM 9.9 05/25/2019 1320   PROT 7.1 05/25/2019 1320   ALBUMIN 4.0 05/25/2019 1320   AST 18 05/25/2019 1320   AST 15 10/05/2018 1244   ALT 30 05/25/2019 1320   ALT 18 10/05/2018 1244   ALKPHOS 67 05/25/2019 1320   BILITOT 0.3 05/25/2019 1320   BILITOT 0.5 10/05/2018 1244   GFRNONAA >60 05/25/2019 1320   GFRNONAA >60 10/05/2018 1244   GFRAA >60 05/25/2019 1320   GFRAA >60 10/05/2018 1244    No results found for: TOTALPROTELP, ALBUMINELP, A1GS, A2GS, BETS, BETA2SER, GAMS, MSPIKE, SPEI  No results found for: KPAFRELGTCHN, LAMBDASER, KAPLAMBRATIO  Lab Results  Component Value Date   WBC 9.2 05/25/2019   NEUTROABS 7.4 05/25/2019   HGB 12.6 05/25/2019   HCT 37.2 05/25/2019   MCV 93.5 05/25/2019   PLT 319 05/25/2019    No results found  for: LABCA2  No components found for: [id number]  No results for input(s): INR in the last 168 hours.  No results found for: LABCA2  No results found for: LKT625  No results found for: WLS937  No results found for: DSK876  No results found for: CA2729  No components found for: HGQUANT  No results found for: CEA1 / No results found for: CEA1   No results found for: AFPTUMOR  No results found for: [place]  No  results found for: HGBA, HGBA2QUANT, HGBFQUANT, HGBSQUAN (Hemoglobinopathy evaluation)   No results found for: LDH  No results found for: IRON, TIBC, IRONPCTSAT (Iron and TIBC)  No results found for: FERRITIN  Urinalysis    Component Value Date/Time   BILIRUBINUR Neg 01/18/2012 1458   PROTEINUR Neg 01/18/2012 1458   UROBILINOGEN 0.2 01/18/2012 1458   NITRITE Neg 01/18/2012 1458   LEUKOCYTESUR Negative 01/18/2012 1458    STUDIES: No results found.   ELIGIBLE FOR AVAILABLE RESEARCH PROTOCOL: no  ASSESSMENT: 39 y.o. [place] woman status post right breast upper inner quadrant biopsy on 09/26/2018 for a clinical T2 N0, stage IB invasive ductal carcinoma, grade 2, estrogen and progesterone receptor positive, HER-2 nonamplified, with an MIB-1-1 of 35%  (1) Genetic testing reported on 10/05/2018 through the [facility] Breast Cancer STAT Panel + Common Hereditary cancer panel found no pathogenic mutations. The STAT Breast cancer panel offered by [facility] includes sequencing and rearrangement analysis for the following 9 genes:  ATM, BRCA1, BRCA2, CDH1, CHEK2, PALB2, PTEN, STK11 and TP53. The Common Hereditary Cancers Panel offered by [facility] includes sequencing and/or deletion duplication testing of the following 47 genes: APC, ATM, AXIN2, BARD1, BMPR1A, BRCA1, BRCA2, BRIP1, CDH1, CDKN2A (p14ARF), CDKN2A (p16INK4a), CKD4, CHEK2, CTNNA1, DICER1, EPCAM (Deletion/duplication testing only), GREM1 (promoter region deletion/duplication testing only), KIT, MEN1, MLH1, MSH2,  MSH3, MSH6, MUTYH, NBN, NF1, NHTL1, PALB2, PDGFRA, PMS2, POLD1, POLE, PTEN, RAD50, RAD51C, RAD51D, SDHB, SDHC, SDHD, SMAD4, SMARCA4. STK11, TP53, TSC1, TSC2, and VHL.  The following genes were evaluated for sequence changes only: SDHA and HOXB13 c.251G>A variant only.   (2) MammaPrint obtained from the original breast biopsy returned "high risk" indicating the patient will have an excellent prognosis if she receives adjuvant chemotherapy  (3) status post right lumpectomy and right axillary sentinel lymph node sampling 11/21/2018 for a pT2 pN1, stage IIA invasive ductal carcinoma, grade 2, with negative margins  (a) a total of 4 sentinel lymph nodes removed, 1 with macrometastasis  (b) status post bilateral breast reductions at the time of lumpectomy  (4) adjuvant chemotherapy consisting of cyclophosphamide and doxorubicin in dose dense fashion x4 started 01/05/2019, completed 02/15/2019, followed by weekly paclitaxel x12 started 03/02/2019  (a) echo 10/27/2018 shows an ejection fraction in the 55-60% range  (5) adjuvant radiation to follow, in [place] per patient's request  (6) tamoxifen started 10/05/2018 in anticipation of surgical delays, discontinued 12/05/2018   PLAN: Taraneh continues to tolerate her chemotherapy remarkably well and will receive her 11th of 12 planned doses today. She will finish next week and we want to hear that bowel ring really loudly.  She has had no peripheral neuropathy, which is very favorable.  We discussed having the port removed before or after radiation. Usually we do it after and since are going to be going to the beach I think it might be difficult to arrange for it to be done immediately after she finishes chemo. Accordingly it will be done when she is done with radiation.  She already has appointment with Dr. Donella Stade for sim and further follow-up.  We discussed the fact that it is very normal to become more emotional after the completion of chemo than  during. In addition of course she just lost her dog which is very sad in itself. She is on venlafaxine very low dose. I would not increase it yet. After 2 or 3 weeks if she finds it is not working for her we can up the dose a bit.  Also I let  her know that it would not be strange if her periods resumed and reminded her that tamoxifen which she will be on is not a contraceptive. It is not impossible that she might become pregnant before her first. Manifests itself. I am going to go ahead and check an [facility] and estradiol level week from now when she returns for her last chemotherapy dose  Total encounter time: 30 minutes*  Sarajane Jews C. Haddon Fyfe, MD 05/25/19 2:16 PM Medical Oncology and Hematology [facility] [place], [place] [address] Tel. [phone]    Fax. [phone]   I, Wilburn Mylar, am acting as scribe for Dr. Virgie Dad. Corine Solorio.  I, Lurline Del MD, have reviewed the above documentation for accuracy and completeness, and I agree with the above.    *Total Encounter Time as defined by the Centers for Medicare and Medicaid Services includes, in addition to the face-to-face time of a patient visit (documented in the note above) non-face-to-face time: obtaining and reviewing outside history, ordering and reviewing medications, tests or procedures, care coordination (communications with other health care professionals or caregivers) and documentation in the medical record.

## 2019-05-25 NOTE — Progress Notes (Signed)
OK to treat with elevated BUN 22 per Dr Jana Hakim

## 2019-05-26 ENCOUNTER — Telehealth: Payer: Self-pay | Admitting: Oncology

## 2019-05-26 NOTE — Telephone Encounter (Signed)
Scheduled appts per 5/20 los. Left voicemail with appt date and time.

## 2019-05-31 NOTE — Progress Notes (Signed)
[place]  [phone] Fax:([phone]) [phone]     ID: CHARLOT GOUIN DOB: [birth year]  MR#: [id number]  MEQ#:[id number]  Patient Care Team: Linda Hedges, DO as PCP - General (Obstetrics and Gynecology) Mauro Kaufmann, RN as Oncology Nurse Navigator Rockwell Germany, RN as Oncology Nurse Navigator Gery Pray, MD as Consulting Physician (Radiation Oncology) Magrinat, Virgie Dad, MD as Consulting Physician (Oncology) Jovita Kussmaul, MD as Consulting Physician (General Surgery) Dillingham, Loel Lofty, DO as Attending Physician (Plastic Surgery) Noreene Filbert, MD as Radiation Oncologist (Radiation Oncology) Scot Dock, NP OTHER MD:  CHIEF COMPLAINT: estrogen receptor positive breast cancer  CURRENT TREATMENT: Adjuvant chemotherapy   INTERVAL HISTORY: Kaylee Romero  returns today for follow-up and treatment of her estrogen receptor positive breast cancer.   She continues weekly Paclitaxel.  Today is week 12 of treatment.  She is feeling much better this week.  She met with Dr. Baruch Gouty in radiation oncology.  She is going to start radiation in early June and receive this for 7 weeks.    REVIEW OF SYSTEMS: Kaylee Romero is feeling well.  She has some questions about what happens, next, but overall is very happy to be completing her adjuvant chemotherapy today.  She does remain fatigued, and is very much ready to gain back her energy.    She denies any peripheral neuropathy.  She has no fever, chills, chest pain, palpitations, cough, shortness of breath, bowel/bladder changes, headaches, nausea, vomiting, mucositis, or any other concerns.  A detailed ROS was otherwise non contributory.    HISTORY OF CURRENT ILLNESS: From the original intake note:  Kaylee Romero (pronounced "Bram-Hall") found a palpable lump in the 12 o'clock position of the right breast, which he brought to the attention of her gynecologist.. She underwent bilateral diagnostic mammography with tomography  and bilateral breast ultrasonography at [facility] on 09/23/2018 showing: breast density category C; a spiculated mass with associated with architectural distortion and pleomorphic calcifications lying in the 11-12 o'clock position, middle depth, measuring 2 cm in size, corresponding to the palpable abnormality.   Physical exam confirmed a firm but mobile mass in the 12 o'clock position of the right breast.  By ultrasound there was a highly suspicious 2.1 cm mass in the 12 o'clock position of the right breast; no other evidence of breast malignancy or lymphadenopathy bilaterally.Sonographic evaluation of the right axilla found no enlarged or abnormal appearing lymph node  In the left axilla there were several lymph nodes with areas of cortical prominence similar to the lymph node noted on the right.  Given the symmetry all these are presumed to be reactive  Accordingly on 09/26/2018 she proceeded to biopsy of the right breast area in question. The pathology from this procedure ([id number]) showed: invasive ductal carcinoma, grade 2; prognostic indicators significant for: estrogen receptor, 80% positive with moderate staining intensity and progesterone receptor, 90% positive with strong staining intensity. Proliferation marker Ki67 at 35%. HER2 negative by immunohistochemistry (1+).  The patient's subsequent history is as detailed below.   PAST MEDICAL HISTORY: Past Medical History:  Diagnosis Date  . Anxiety   . Family history of breast cancer   . HSV (herpes simplex virus) anogenital infection    positive titer only    PAST SURGICAL HISTORY: Past Surgical History:  Procedure Laterality Date  . BREAST LUMPECTOMY WITH AXILLARY LYMPH NODE BIOPSY Right 11/21/2018   Procedure: RIGHT BREAST LUMPECTOMY WITH SENTINEL LYMPH NODE BIOPSY;  Surgeon: Jovita Kussmaul, MD;  Location:  [facility];  Service: General;  Laterality: Right;  . BREAST REDUCTION SURGERY Bilateral 11/21/2018   Procedure: BILATERAL  MAMMARY REDUCTION  (BREAST);  Surgeon: Wallace Going, DO;  Location: [facility];  Service: Plastics;  Laterality: Bilateral;  . PORTACATH PLACEMENT N/A 11/21/2018   Procedure: INSERTION LEFT PORT-A-CATH WITH ULTRASOUND GUIDANCE;  Surgeon: Jovita Kussmaul, MD;  Location: [facility];  Service: General;  Laterality: N/A;  . TONSILLECTOMY  1987  . TONSILLECTOMY    . WISDOM TOOTH EXTRACTION      FAMILY HISTORY: Family History  Problem Relation Age of Onset  . Hypertension Father   . Alcohol abuse Paternal Grandfather   . Heart disease Paternal Grandfather   . Alcohol abuse Maternal Grandfather   . Heart disease Maternal Grandmother   . Breast cancer Other    Patient's parents are both living at age 77, as of 09/2018. Her father lives in [place], and her mother lives in [place], [place]. The patient denies a family hx of ovarian cancer. A maternal great aunt was diagnosed with breast cancer twice. She has 2 brothers, no sisters   GYNECOLOGIC HISTORY:  No LMP recorded. (Menstrual status: Chemotherapy). Menarche: 39 years old Age at first live birth: 39 years old [place] P 1 Currently having regular periods  Contraceptive: Mirena IUD in place HRT n/a  Hysterectomy? no BSO? no   SOCIAL HISTORY: (updated January 2021) Tyger is a [employer]. Husband Tommi Rumps is in management with [employer]. Daughter Normand Sloop is 66 months old.     ADVANCED DIRECTIVES: not in place. Her husband is automatically her HCPOA.   HEALTH MAINTENANCE: Social History   Tobacco Use  . Smoking status: Former Smoker    Packs/day: 0.50    Years: 10.00    Pack years: 5.00    Types: Cigarettes    Start date: 01/13/2002    Quit date: 10/17/2016    Years since quitting: 2.6  . Smokeless tobacco: Never Used  Substance Use Topics  . Alcohol use: Yes    Comment: Socially  . Drug use: No     Colonoscopy: n/a  PAP: 09/20/2018  Bone density: n/a   Allergies  Allergen Reactions  . Betadine [Povidone Iodine] Rash     Current Outpatient Medications  Medication Sig Dispense Refill  . acetaminophen (TYLENOL) 500 MG tablet Take 1 tablet (500 mg total) by mouth every 6 (six) hours as needed. 60 tablet 0  . amphetamine-dextroamphetamine (ADDERALL XR) 20 MG 24 hr capsule Take 20 mg by mouth daily.    . Ascorbic Acid (VITAMIN C) 1000 MG tablet Take 1,000 mg by mouth daily.    . clobetasol cream (TEMOVATE) 4.01 % Apply 1 application topically daily.    Marland Kitchen gabapentin (NEURONTIN) 300 MG capsule Take 1 capsule (300 mg total) by mouth at bedtime. 90 capsule 4  . hydrOXYzine (ATARAX/VISTARIL) 10 MG tablet Take 10 mg by mouth at bedtime.     . lidocaine-prilocaine (EMLA) cream Apply to affected area once 30 g 3  . LORazepam (ATIVAN) 0.5 MG tablet Take 1 tablet (0.5 mg total) by mouth at bedtime as needed (Nausea or vomiting). 30 tablet 0  . Multiple Vitamin (MULTIVITAMIN WITH MINERALS) TABS tablet Take 1 tablet by mouth daily.    . naproxen sodium (ALEVE) 220 MG tablet Take 1 tablet (220 mg total) by mouth 3 (three) times daily with meals. 60 tablet 3  . prochlorperazine (COMPAZINE) 10 MG tablet Take 1 tablet (10 mg total) by mouth every 6 (six)  hours as needed (Nausea or vomiting). (Patient not taking: Reported on 05/18/2019) 30 tablet 1  . Tetrahydrozoline HCl (VISINE OP) Place 1 drop into both eyes daily as needed (redness).    . venlafaxine XR (EFFEXOR-XR) 37.5 MG 24 hr capsule TAKE 1 CAPSULE BY MOUTH DAILY WITH BREAKFAST. 30 capsule 0  . vitamin B-12 (CYANOCOBALAMIN) 1000 MCG tablet Take 1,000 mcg by mouth daily.     No current facility-administered medications for this visit.   Facility-Administered Medications Ordered in Other Visits  Medication Dose Route Frequency Provider Last Rate Last Admin  . sodium chloride flush (NS) 0.9 % injection 10 mL  10 mL Intracatheter PRN Magrinat, Virgie Dad, MD   10 mL at 05/18/19 1553    OBJECTIVE: White woman in no acute distress  There were no vitals filed for this  visit. Wt Readings from Last 3 Encounters:  05/25/19 184 lb 1.6 oz (83.5 kg)  05/18/19 184 lb 6.4 oz (83.6 kg)  05/17/19 184 lb 9.6 oz (83.7 kg)   There is no height or weight on file to calculate BMI.    ECOG FS:1 - Symptomatic but completely ambulatory GENERAL: Patient is a well appearing female in no acute distress HEENT:  Sclerae anicteric. Mask in place. Neck is supple.  NODES:  No cervical, supraclavicular, or axillary lymphadenopathy palpated.  BREAST EXAM:  Deferred. LUNGS:  Clear to auscultation bilaterally.  No wheezes or rhonchi. HEART:  Regular rate and rhythm. No murmur appreciated. ABDOMEN:  Soft, nontender.  Positive, normoactive bowel sounds. No organomegaly palpated. MSK:  No focal spinal tenderness to palpation. Full range of motion bilaterally in the upper extremities. EXTREMITIES:  No peripheral edema.   SKIN:  Clear with no obvious rashes or skin changes. No nail dyscrasia. NEURO:  Nonfocal. Well oriented.  Appropriate affect.     LAB RESULTS:  CMP     Component Value Date/Time   NA 139 05/25/2019 1320   K 4.2 05/25/2019 1320   CL 102 05/25/2019 1320   CO2 26 05/25/2019 1320   GLUCOSE 97 05/25/2019 1320   BUN 22 (H) 05/25/2019 1320   CREATININE 0.69 05/25/2019 1320   CREATININE 0.68 10/05/2018 1244   CALCIUM 9.9 05/25/2019 1320   PROT 7.1 05/25/2019 1320   ALBUMIN 4.0 05/25/2019 1320   AST 18 05/25/2019 1320   AST 15 10/05/2018 1244   ALT 30 05/25/2019 1320   ALT 18 10/05/2018 1244   ALKPHOS 67 05/25/2019 1320   BILITOT 0.3 05/25/2019 1320   BILITOT 0.5 10/05/2018 1244   GFRNONAA >60 05/25/2019 1320   GFRNONAA >60 10/05/2018 1244   GFRAA >60 05/25/2019 1320   GFRAA >60 10/05/2018 1244    No results found for: TOTALPROTELP, ALBUMINELP, A1GS, A2GS, BETS, BETA2SER, GAMS, MSPIKE, SPEI  No results found for: KPAFRELGTCHN, LAMBDASER, KAPLAMBRATIO  Lab Results  Component Value Date   WBC 9.2 05/25/2019   NEUTROABS 7.4 05/25/2019   HGB 12.6  05/25/2019   HCT 37.2 05/25/2019   MCV 93.5 05/25/2019   PLT 319 05/25/2019    No results found for: LABCA2  No components found for: IRSWNI627  No results for input(s): INR in the last 168 hours.  No results found for: LABCA2  No results found for: OJJ009  No results found for: FGH829  No results found for: HBZ169  No results found for: CA2729  No components found for: HGQUANT  No results found for: CEA1 / No results found for: CEA1   No results found for: [facility]  No results found for: CHROMOGRNA  No results found for: HGBA, HGBA2QUANT, HGBFQUANT, HGBSQUAN (Hemoglobinopathy evaluation)   No results found for: LDH  No results found for: IRON, TIBC, IRONPCTSAT (Iron and TIBC)  No results found for: FERRITIN  Urinalysis    Component Value Date/Time   BILIRUBINUR Neg 01/18/2012 1458   PROTEINUR Neg 01/18/2012 1458   UROBILINOGEN 0.2 01/18/2012 1458   NITRITE Neg 01/18/2012 1458   LEUKOCYTESUR Negative 01/18/2012 1458    STUDIES: No results found.   ELIGIBLE FOR AVAILABLE RESEARCH PROTOCOL: no  ASSESSMENT: 39 y.o. [place] woman status post right breast upper inner quadrant biopsy on 09/26/2018 for a clinical T2 N0, stage IB invasive ductal carcinoma, grade 2, estrogen and progesterone receptor positive, HER-2 nonamplified, with an MIB-1-1 of 35%  (1) Genetic testing reported on 10/05/2018 through the [facility] Breast Cancer STAT Panel + Common Hereditary cancer panel found no pathogenic mutations. The STAT Breast cancer panel offered by [facility] includes sequencing and rearrangement analysis for the following 9 genes:  ATM, BRCA1, BRCA2, CDH1, CHEK2, PALB2, PTEN, STK11 and TP53. The Common Hereditary Cancers Panel offered by [facility] includes sequencing and/or deletion duplication testing of the following 47 genes: APC, ATM, AXIN2, BARD1, BMPR1A, BRCA1, BRCA2, BRIP1, CDH1, CDKN2A (p14ARF), CDKN2A (p16INK4a), CKD4, CHEK2, CTNNA1, DICER1, EPCAM  (Deletion/duplication testing only), GREM1 (promoter region deletion/duplication testing only), KIT, MEN1, MLH1, MSH2, MSH3, MSH6, MUTYH, NBN, NF1, NHTL1, PALB2, PDGFRA, PMS2, POLD1, POLE, PTEN, RAD50, RAD51C, RAD51D, SDHB, SDHC, SDHD, SMAD4, SMARCA4. STK11, TP53, TSC1, TSC2, and VHL.  The following genes were evaluated for sequence changes only: SDHA and HOXB13 c.251G>A variant only.   (2) MammaPrint obtained from the original breast biopsy returned "high risk" indicating the patient will have an excellent prognosis if she receives adjuvant chemotherapy  (3) status post right lumpectomy and right axillary sentinel lymph node sampling 11/21/2018 for a pT2 pN1, stage IIA invasive ductal carcinoma, grade 2, with negative margins  (a) a total of 4 sentinel lymph nodes removed, 1 with macrometastasis  (b) status post bilateral breast reductions at the time of lumpectomy  (4) adjuvant chemotherapy consisting of cyclophosphamide and doxorubicin in dose dense fashion x4 started 01/05/2019, completed 02/15/2019, followed by weekly paclitaxel x12 started 03/02/2019  (a) echo 10/27/2018 shows an ejection fraction in the 55-60% range  (5) adjuvant radiation to follow, in [place] per patient's request  (6) tamoxifen started 10/05/2018 in anticipation of surgical delays, discontinued 12/05/2018   PLAN: Kairee continues to tolerate her chemotherapy very well.  She will complete her treatment with chemotherapy today.  She is very happy about this.  She has developed no peripheral neuropathy.    Her next step will be adjuvant radiation therapy.  She will see Dr. Jana Hakim towards the end of her radiation to discuss anti estrogen therapy.  She is being testing today with FSH, LH, and estradiol to see if she is post menopausal.    I recommended healthy diet and exercise.  Her energy level will slowly return, though it does take some time.    We will see Sharlena back in August for labs and f/u.  She was recommended  to continue with the appropriate pandemic precautions. She knows to call for any questions that may arise between now and her next appointment.  We are happy to see her sooner if needed.   Total encounter time: 20 minutes*  Wilber Bihari, NP 05/31/19 5:17 PM Medical Oncology and Hematology [facility] [place], [place] [address]  Tel. [phone]    Fax. [phone]   *Total Encounter Time as defined by the Centers for Medicare and Medicaid Services includes, in addition to the face-to-face time of a patient visit (documented in the note above) non-face-to-face time: obtaining and reviewing outside history, ordering and reviewing medications, tests or procedures, care coordination (communications with other health care professionals or caregivers) and documentation in the medical record.

## 2019-06-01 ENCOUNTER — Inpatient Hospital Stay: Payer: BC Managed Care – PPO

## 2019-06-01 ENCOUNTER — Other Ambulatory Visit: Payer: Self-pay | Admitting: *Deleted

## 2019-06-01 ENCOUNTER — Encounter: Payer: Self-pay | Admitting: *Deleted

## 2019-06-01 ENCOUNTER — Other Ambulatory Visit: Payer: Self-pay

## 2019-06-01 ENCOUNTER — Inpatient Hospital Stay (HOSPITAL_BASED_OUTPATIENT_CLINIC_OR_DEPARTMENT_OTHER): Payer: BC Managed Care – PPO | Admitting: Adult Health

## 2019-06-01 VITALS — BP 134/83 | HR 103 | Temp 97.9°F | Resp 20 | Ht 64.0 in | Wt 184.8 lb

## 2019-06-01 VITALS — HR 97 | Resp 18

## 2019-06-01 DIAGNOSIS — C50411 Malignant neoplasm of upper-outer quadrant of right female breast: Secondary | ICD-10-CM

## 2019-06-01 DIAGNOSIS — Z170 Estrogen receptor positive status [ER+]: Secondary | ICD-10-CM

## 2019-06-01 DIAGNOSIS — Z5111 Encounter for antineoplastic chemotherapy: Secondary | ICD-10-CM | POA: Diagnosis not present

## 2019-06-01 LAB — CBC WITH DIFFERENTIAL (CANCER CENTER ONLY)
Abs Immature Granulocytes: 0.05 10*3/uL (ref 0.00–0.07)
Basophils Absolute: 0 10*3/uL (ref 0.0–0.1)
Basophils Relative: 1 %
Eosinophils Absolute: 0.1 10*3/uL (ref 0.0–0.5)
Eosinophils Relative: 2 %
HCT: 38 % (ref 36.0–46.0)
Hemoglobin: 13.1 g/dL (ref 12.0–15.0)
Immature Granulocytes: 1 %
Lymphocytes Relative: 20 %
Lymphs Abs: 1.2 10*3/uL (ref 0.7–4.0)
MCH: 32.6 pg (ref 26.0–34.0)
MCHC: 34.5 g/dL (ref 30.0–36.0)
MCV: 94.5 fL (ref 80.0–100.0)
Monocytes Absolute: 0.4 10*3/uL (ref 0.1–1.0)
Monocytes Relative: 7 %
Neutro Abs: 4.2 10*3/uL (ref 1.7–7.7)
Neutrophils Relative %: 69 %
Platelet Count: 322 10*3/uL (ref 150–400)
RBC: 4.02 MIL/uL (ref 3.87–5.11)
RDW: 13.3 % (ref 11.5–15.5)
WBC Count: 6 10*3/uL (ref 4.0–10.5)
nRBC: 0 % (ref 0.0–0.2)

## 2019-06-01 LAB — CMP (CANCER CENTER ONLY)
ALT: 26 U/L (ref 0–44)
AST: 21 U/L (ref 15–41)
Albumin: 4.2 g/dL (ref 3.5–5.0)
Alkaline Phosphatase: 59 U/L (ref 38–126)
Anion gap: 9 (ref 5–15)
BUN: 15 mg/dL (ref 6–20)
CO2: 27 mmol/L (ref 22–32)
Calcium: 9.6 mg/dL (ref 8.9–10.3)
Chloride: 102 mmol/L (ref 98–111)
Creatinine: 0.59 mg/dL (ref 0.44–1.00)
GFR, Est AFR Am: >60 mL/min (ref >60)
GFR, Estimated: >60 mL/min (ref >60)
Glucose, Bld: 156 mg/dL — ABNORMAL HIGH (ref 70–99)
Potassium: 3.7 mmol/L (ref 3.5–5.1)
Sodium: 138 mmol/L (ref 135–145)
Total Bilirubin: 0.5 mg/dL (ref 0.3–1.2)
Total Protein: 7.1 g/dL (ref 6.5–8.1)

## 2019-06-01 MED ORDER — FAMOTIDINE IN NACL 20-0.9 MG/50ML-% IV SOLN
20.0000 mg | Freq: Once | INTRAVENOUS | Status: AC
  Administered 2019-06-01: 20 mg via INTRAVENOUS

## 2019-06-01 MED ORDER — SODIUM CHLORIDE 0.9 % IV SOLN
80.0000 mg/m2 | Freq: Once | INTRAVENOUS | Status: AC
  Administered 2019-06-01: 150 mg via INTRAVENOUS
  Filled 2019-06-01: qty 25

## 2019-06-01 MED ORDER — DEXAMETHASONE SODIUM PHOSPHATE 10 MG/ML IJ SOLN
4.0000 mg | Freq: Once | INTRAMUSCULAR | Status: AC
  Administered 2019-06-01: 4 mg via INTRAVENOUS

## 2019-06-01 MED ORDER — HEPARIN SOD (PORK) LOCK FLUSH 100 UNIT/ML IV SOLN
500.0000 [IU] | Freq: Once | INTRAVENOUS | Status: AC | PRN
  Administered 2019-06-01: 500 [IU]
  Filled 2019-06-01: qty 5

## 2019-06-01 MED ORDER — DIPHENHYDRAMINE HCL 50 MG/ML IJ SOLN
INTRAMUSCULAR | Status: AC
  Filled 2019-06-01: qty 1

## 2019-06-01 MED ORDER — SODIUM CHLORIDE 0.9 % IV SOLN
Freq: Once | INTRAVENOUS | Status: AC
  Filled 2019-06-01: qty 250

## 2019-06-01 MED ORDER — VENLAFAXINE HCL ER 75 MG PO CP24: 75.0000 mg | ORAL_CAPSULE | Freq: Every day | ORAL | 3 refills | Status: DC

## 2019-06-01 MED ORDER — FAMOTIDINE IN NACL 20-0.9 MG/50ML-% IV SOLN
INTRAVENOUS | Status: AC
  Filled 2019-06-01: qty 50

## 2019-06-01 MED ORDER — SODIUM CHLORIDE 0.9% FLUSH
10.0000 mL | INTRAVENOUS | Status: DC | PRN
  Administered 2019-06-01: 10 mL
  Filled 2019-06-01: qty 10

## 2019-06-01 MED ORDER — DEXAMETHASONE SODIUM PHOSPHATE 10 MG/ML IJ SOLN
INTRAMUSCULAR | Status: AC
  Filled 2019-06-01: qty 1

## 2019-06-01 MED ORDER — LORAZEPAM 0.5 MG PO TABS: 0.5000 mg | ORAL_TABLET | Freq: Every evening | ORAL | 0 refills | Status: DC | PRN

## 2019-06-01 MED ORDER — DIPHENHYDRAMINE HCL 50 MG/ML IJ SOLN
25.0000 mg | Freq: Once | INTRAMUSCULAR | Status: AC
  Administered 2019-06-01: 25 mg via INTRAVENOUS

## 2019-06-01 NOTE — Patient Instructions (Signed)
[facility] Discharge Instructions for Patients Receiving Chemotherapy  Today you received the following chemotherapy agents :  Taxol.  To help prevent nausea and vomiting after your treatment, we encourage you to take your nausea medication as prescribed.   If you develop nausea and vomiting that is not controlled by your nausea medication, call the clinic.   BELOW ARE SYMPTOMS THAT SHOULD BE REPORTED IMMEDIATELY:  *FEVER GREATER THAN 100.5 F  *CHILLS WITH OR WITHOUT FEVER  NAUSEA AND VOMITING THAT IS NOT CONTROLLED WITH YOUR NAUSEA MEDICATION  *UNUSUAL SHORTNESS OF BREATH  *UNUSUAL BRUISING OR BLEEDING  TENDERNESS IN MOUTH AND THROAT WITH OR WITHOUT PRESENCE OF ULCERS  *URINARY PROBLEMS  *BOWEL PROBLEMS  UNUSUAL RASH Items with * indicate a potential emergency and should be followed up as soon as possible.  Feel free to call the clinic should you have any questions or concerns. The clinic phone number is [phone].  Please show the CHEMO ALERT CARD at check-in to the Emergency Department and triage nurse.   

## 2019-06-02 ENCOUNTER — Telehealth: Payer: Self-pay | Admitting: Adult Health

## 2019-06-02 LAB — LUTEINIZING HORMONE: LH: 28.1 m[IU]/mL

## 2019-06-02 LAB — FOLLICLE STIMULATING HORMONE: FSH: 46.9 m[IU]/mL

## 2019-06-02 NOTE — Telephone Encounter (Signed)
No 5/27 los. No changes made to pt's schedule.

## 2019-06-03 ENCOUNTER — Encounter: Payer: Self-pay | Admitting: Adult Health

## 2019-06-07 ENCOUNTER — Ambulatory Visit: Payer: Self-pay | Admitting: General Surgery

## 2019-06-07 LAB — ESTRADIOL, ULTRA SENS: Estradiol, Sensitive: <2.5 pg/mL

## 2019-06-09 ENCOUNTER — Telehealth: Payer: Self-pay | Admitting: *Deleted

## 2019-06-09 NOTE — Telephone Encounter (Signed)
Per Annabelle Harman, called and left message on pt personal cell making aware that her lab results were consistent with menopause. Advised that if there were any other questions or concerns to call office

## 2019-06-12 ENCOUNTER — Ambulatory Visit
Admission: RE | Admit: 2019-06-12 | Discharge: 2019-06-12 | Disposition: A | Discharge status: Home / Self Care | Payer: BC Managed Care – PPO | Source: Ambulatory Visit | Attending: Radiation Oncology | Admitting: Radiation Oncology

## 2019-06-12 DIAGNOSIS — C50411 Malignant neoplasm of upper-outer quadrant of right female breast: Secondary | ICD-10-CM | POA: Insufficient documentation

## 2019-06-12 DIAGNOSIS — Z510 Encounter for antineoplastic radiation therapy: Secondary | ICD-10-CM | POA: Insufficient documentation

## 2019-06-12 DIAGNOSIS — Z170 Estrogen receptor positive status [ER+]: Secondary | ICD-10-CM | POA: Insufficient documentation

## 2019-06-13 ENCOUNTER — Encounter: Payer: Self-pay | Admitting: *Deleted

## 2019-06-14 DIAGNOSIS — C50411 Malignant neoplasm of upper-outer quadrant of right female breast: Secondary | ICD-10-CM | POA: Diagnosis not present

## 2019-06-16 ENCOUNTER — Other Ambulatory Visit: Payer: Self-pay | Admitting: *Deleted

## 2019-06-16 DIAGNOSIS — Z170 Estrogen receptor positive status [ER+]: Secondary | ICD-10-CM

## 2019-06-20 ENCOUNTER — Ambulatory Visit: Admission: RE | Admit: 2019-06-20 | Payer: BC Managed Care – PPO | Source: Ambulatory Visit

## 2019-06-20 DIAGNOSIS — C50411 Malignant neoplasm of upper-outer quadrant of right female breast: Secondary | ICD-10-CM | POA: Diagnosis not present

## 2019-06-21 ENCOUNTER — Ambulatory Visit
Admission: RE | Admit: 2019-06-21 | Discharge: 2019-06-21 | Disposition: A | Discharge status: Home / Self Care | Payer: BC Managed Care – PPO | Source: Ambulatory Visit | Attending: Radiation Oncology | Admitting: Radiation Oncology

## 2019-06-21 DIAGNOSIS — C50411 Malignant neoplasm of upper-outer quadrant of right female breast: Secondary | ICD-10-CM | POA: Diagnosis not present

## 2019-06-22 ENCOUNTER — Ambulatory Visit
Admission: RE | Admit: 2019-06-22 | Discharge: 2019-06-22 | Disposition: A | Discharge status: Home / Self Care | Payer: BC Managed Care – PPO | Source: Ambulatory Visit | Attending: Radiation Oncology | Admitting: Radiation Oncology

## 2019-06-22 DIAGNOSIS — C50411 Malignant neoplasm of upper-outer quadrant of right female breast: Secondary | ICD-10-CM | POA: Diagnosis not present

## 2019-06-23 ENCOUNTER — Ambulatory Visit
Admission: RE | Admit: 2019-06-23 | Discharge: 2019-06-23 | Disposition: A | Discharge status: Home / Self Care | Payer: BC Managed Care – PPO | Source: Ambulatory Visit | Attending: Radiation Oncology | Admitting: Radiation Oncology

## 2019-06-23 DIAGNOSIS — C50411 Malignant neoplasm of upper-outer quadrant of right female breast: Secondary | ICD-10-CM | POA: Diagnosis not present

## 2019-06-26 ENCOUNTER — Ambulatory Visit
Admission: RE | Admit: 2019-06-26 | Discharge: 2019-06-26 | Disposition: A | Discharge status: Home / Self Care | Payer: BC Managed Care – PPO | Source: Ambulatory Visit | Attending: Radiation Oncology | Admitting: Radiation Oncology

## 2019-06-26 DIAGNOSIS — C50411 Malignant neoplasm of upper-outer quadrant of right female breast: Secondary | ICD-10-CM | POA: Diagnosis not present

## 2019-06-27 ENCOUNTER — Ambulatory Visit
Admission: RE | Admit: 2019-06-27 | Discharge: 2019-06-27 | Disposition: A | Discharge status: Home / Self Care | Payer: BC Managed Care – PPO | Source: Ambulatory Visit | Attending: Radiation Oncology | Admitting: Radiation Oncology

## 2019-06-27 DIAGNOSIS — C50411 Malignant neoplasm of upper-outer quadrant of right female breast: Secondary | ICD-10-CM | POA: Diagnosis not present

## 2019-06-28 ENCOUNTER — Ambulatory Visit
Admission: RE | Admit: 2019-06-28 | Discharge: 2019-06-28 | Disposition: A | Discharge status: Home / Self Care | Payer: BC Managed Care – PPO | Source: Ambulatory Visit | Attending: Radiation Oncology | Admitting: Radiation Oncology

## 2019-06-28 ENCOUNTER — Other Ambulatory Visit: Payer: Self-pay

## 2019-06-28 ENCOUNTER — Encounter (HOSPITAL_BASED_OUTPATIENT_CLINIC_OR_DEPARTMENT_OTHER): Payer: Self-pay | Admitting: General Surgery

## 2019-06-28 DIAGNOSIS — C50411 Malignant neoplasm of upper-outer quadrant of right female breast: Secondary | ICD-10-CM | POA: Diagnosis not present

## 2019-06-29 ENCOUNTER — Ambulatory Visit
Admission: RE | Admit: 2019-06-29 | Discharge: 2019-06-29 | Disposition: A | Discharge status: Home / Self Care | Payer: BC Managed Care – PPO | Source: Ambulatory Visit | Attending: Radiation Oncology | Admitting: Radiation Oncology

## 2019-06-29 DIAGNOSIS — C50411 Malignant neoplasm of upper-outer quadrant of right female breast: Secondary | ICD-10-CM | POA: Diagnosis not present

## 2019-06-30 ENCOUNTER — Ambulatory Visit
Admission: RE | Admit: 2019-06-30 | Discharge: 2019-06-30 | Disposition: A | Discharge status: Home / Self Care | Payer: BC Managed Care – PPO | Source: Ambulatory Visit | Attending: Radiation Oncology | Admitting: Radiation Oncology

## 2019-06-30 DIAGNOSIS — C50411 Malignant neoplasm of upper-outer quadrant of right female breast: Secondary | ICD-10-CM | POA: Diagnosis not present

## 2019-07-03 ENCOUNTER — Other Ambulatory Visit (HOSPITAL_COMMUNITY)
Admission: RE | Admit: 2019-07-03 | Discharge: 2019-07-03 | Disposition: A | Discharge status: Home / Self Care | Payer: BC Managed Care – PPO | Source: Ambulatory Visit | Attending: General Surgery | Admitting: General Surgery

## 2019-07-03 ENCOUNTER — Ambulatory Visit
Admission: RE | Admit: 2019-07-03 | Discharge: 2019-07-03 | Disposition: A | Discharge status: Home / Self Care | Payer: BC Managed Care – PPO | Source: Ambulatory Visit | Attending: Radiation Oncology | Admitting: Radiation Oncology

## 2019-07-03 ENCOUNTER — Encounter (HOSPITAL_BASED_OUTPATIENT_CLINIC_OR_DEPARTMENT_OTHER)
Admission: RE | Admit: 2019-07-03 | Discharge: 2019-07-03 | Disposition: A | Discharge status: Home / Self Care | Payer: BC Managed Care – PPO | Source: Ambulatory Visit | Attending: General Surgery | Admitting: General Surgery

## 2019-07-03 DIAGNOSIS — Z6831 Body mass index (BMI) 31.0-31.9, adult: Secondary | ICD-10-CM | POA: Diagnosis not present

## 2019-07-03 DIAGNOSIS — Z452 Encounter for adjustment and management of vascular access device: Secondary | ICD-10-CM | POA: Diagnosis not present

## 2019-07-03 DIAGNOSIS — Z9221 Personal history of antineoplastic chemotherapy: Secondary | ICD-10-CM | POA: Diagnosis not present

## 2019-07-03 DIAGNOSIS — F419 Anxiety disorder, unspecified: Secondary | ICD-10-CM | POA: Diagnosis not present

## 2019-07-03 DIAGNOSIS — Z7952 Long term (current) use of systemic steroids: Secondary | ICD-10-CM | POA: Diagnosis not present

## 2019-07-03 DIAGNOSIS — Z01812 Encounter for preprocedural laboratory examination: Secondary | ICD-10-CM | POA: Insufficient documentation

## 2019-07-03 DIAGNOSIS — Z853 Personal history of malignant neoplasm of breast: Secondary | ICD-10-CM | POA: Diagnosis not present

## 2019-07-03 DIAGNOSIS — Z79899 Other long term (current) drug therapy: Secondary | ICD-10-CM | POA: Diagnosis not present

## 2019-07-03 DIAGNOSIS — Z87891 Personal history of nicotine dependence: Secondary | ICD-10-CM | POA: Diagnosis not present

## 2019-07-03 DIAGNOSIS — E669 Obesity, unspecified: Secondary | ICD-10-CM | POA: Diagnosis not present

## 2019-07-03 DIAGNOSIS — Z20822 Contact with and (suspected) exposure to covid-19: Secondary | ICD-10-CM | POA: Diagnosis not present

## 2019-07-03 DIAGNOSIS — Z08 Encounter for follow-up examination after completed treatment for malignant neoplasm: Secondary | ICD-10-CM | POA: Diagnosis present

## 2019-07-03 DIAGNOSIS — Z923 Personal history of irradiation: Secondary | ICD-10-CM | POA: Diagnosis not present

## 2019-07-03 DIAGNOSIS — C50411 Malignant neoplasm of upper-outer quadrant of right female breast: Secondary | ICD-10-CM | POA: Diagnosis not present

## 2019-07-03 LAB — BASIC METABOLIC PANEL
Anion gap: 10 (ref 5–15)
BUN: 14 mg/dL (ref 6–20)
CO2: 28 mmol/L (ref 22–32)
Calcium: 10.1 mg/dL (ref 8.9–10.3)
Chloride: 101 mmol/L (ref 98–111)
Creatinine, Ser: 0.65 mg/dL (ref 0.44–1.00)
GFR calc Af Amer: >60 mL/min (ref >60)
GFR calc non Af Amer: >60 mL/min (ref >60)
Glucose, Bld: 97 mg/dL (ref 70–99)
Potassium: 3.8 mmol/L (ref 3.5–5.1)
Sodium: 139 mmol/L (ref 135–145)

## 2019-07-03 LAB — SARS CORONAVIRUS 2 (TAT 6-24 HRS): SARS Coronavirus 2: NEGATIVE

## 2019-07-03 LAB — POCT PREGNANCY, URINE: Preg Test, Ur: NEGATIVE

## 2019-07-03 NOTE — Progress Notes (Signed)

## 2019-07-04 ENCOUNTER — Ambulatory Visit
Admission: RE | Admit: 2019-07-04 | Discharge: 2019-07-04 | Disposition: A | Discharge status: Home / Self Care | Payer: BC Managed Care – PPO | Source: Ambulatory Visit | Attending: Radiation Oncology | Admitting: Radiation Oncology

## 2019-07-04 DIAGNOSIS — C50411 Malignant neoplasm of upper-outer quadrant of right female breast: Secondary | ICD-10-CM | POA: Diagnosis not present

## 2019-07-05 ENCOUNTER — Other Ambulatory Visit: Payer: Self-pay

## 2019-07-05 ENCOUNTER — Inpatient Hospital Stay: Payer: BC Managed Care – PPO | Attending: Radiation Oncology

## 2019-07-05 ENCOUNTER — Ambulatory Visit
Admission: RE | Admit: 2019-07-05 | Discharge: 2019-07-05 | Disposition: A | Discharge status: Home / Self Care | Payer: BC Managed Care – PPO | Source: Ambulatory Visit | Attending: Radiation Oncology | Admitting: Radiation Oncology

## 2019-07-05 DIAGNOSIS — C50411 Malignant neoplasm of upper-outer quadrant of right female breast: Secondary | ICD-10-CM | POA: Diagnosis not present

## 2019-07-05 LAB — CBC
HCT: 41.6 % (ref 36.0–46.0)
Hemoglobin: 14.2 g/dL (ref 12.0–15.0)
MCH: 31.5 pg (ref 26.0–34.0)
MCHC: 34.1 g/dL (ref 30.0–36.0)
MCV: 92.2 fL (ref 80.0–100.0)
Platelets: 296 10*3/uL (ref 150–400)
RBC: 4.51 MIL/uL (ref 3.87–5.11)
RDW: 13.2 % (ref 11.5–15.5)
WBC: 6.2 10*3/uL (ref 4.0–10.5)
nRBC: 0 % (ref 0.0–0.2)

## 2019-07-06 ENCOUNTER — Encounter (HOSPITAL_BASED_OUTPATIENT_CLINIC_OR_DEPARTMENT_OTHER): Payer: Self-pay | Admitting: General Surgery

## 2019-07-06 ENCOUNTER — Ambulatory Visit (HOSPITAL_BASED_OUTPATIENT_CLINIC_OR_DEPARTMENT_OTHER): Payer: BC Managed Care – PPO | Admitting: Anesthesiology

## 2019-07-06 ENCOUNTER — Ambulatory Visit
Admission: RE | Admit: 2019-07-06 | Discharge: 2019-07-06 | Disposition: A | Discharge status: Home / Self Care | Payer: BC Managed Care – PPO | Source: Ambulatory Visit | Attending: Radiation Oncology | Admitting: Radiation Oncology

## 2019-07-06 ENCOUNTER — Other Ambulatory Visit: Payer: Self-pay

## 2019-07-06 ENCOUNTER — Encounter (HOSPITAL_BASED_OUTPATIENT_CLINIC_OR_DEPARTMENT_OTHER)
Admission: RE | Disposition: A | Discharge status: Home / Self Care | Payer: Self-pay | Source: Home / Self Care | Attending: General Surgery

## 2019-07-06 ENCOUNTER — Ambulatory Visit (HOSPITAL_BASED_OUTPATIENT_CLINIC_OR_DEPARTMENT_OTHER)
Admission: RE | Admit: 2019-07-06 | Discharge: 2019-07-06 | Disposition: A | Discharge status: Home / Self Care | Payer: BC Managed Care – PPO | Attending: General Surgery | Admitting: General Surgery

## 2019-07-06 DIAGNOSIS — F419 Anxiety disorder, unspecified: Secondary | ICD-10-CM | POA: Insufficient documentation

## 2019-07-06 DIAGNOSIS — Z170 Estrogen receptor positive status [ER+]: Secondary | ICD-10-CM | POA: Insufficient documentation

## 2019-07-06 DIAGNOSIS — E669 Obesity, unspecified: Secondary | ICD-10-CM | POA: Insufficient documentation

## 2019-07-06 DIAGNOSIS — Z9221 Personal history of antineoplastic chemotherapy: Secondary | ICD-10-CM | POA: Insufficient documentation

## 2019-07-06 DIAGNOSIS — C50411 Malignant neoplasm of upper-outer quadrant of right female breast: Secondary | ICD-10-CM | POA: Diagnosis not present

## 2019-07-06 DIAGNOSIS — Z6831 Body mass index (BMI) 31.0-31.9, adult: Secondary | ICD-10-CM | POA: Insufficient documentation

## 2019-07-06 DIAGNOSIS — Z510 Encounter for antineoplastic radiation therapy: Secondary | ICD-10-CM | POA: Diagnosis present

## 2019-07-06 DIAGNOSIS — Z923 Personal history of irradiation: Secondary | ICD-10-CM | POA: Insufficient documentation

## 2019-07-06 DIAGNOSIS — Z87891 Personal history of nicotine dependence: Secondary | ICD-10-CM | POA: Insufficient documentation

## 2019-07-06 DIAGNOSIS — Z853 Personal history of malignant neoplasm of breast: Secondary | ICD-10-CM | POA: Insufficient documentation

## 2019-07-06 DIAGNOSIS — Z79899 Other long term (current) drug therapy: Secondary | ICD-10-CM | POA: Insufficient documentation

## 2019-07-06 DIAGNOSIS — Z7952 Long term (current) use of systemic steroids: Secondary | ICD-10-CM | POA: Insufficient documentation

## 2019-07-06 DIAGNOSIS — Z452 Encounter for adjustment and management of vascular access device: Secondary | ICD-10-CM | POA: Diagnosis not present

## 2019-07-06 HISTORY — PX: PORT-A-CATH REMOVAL: SHX5289

## 2019-07-06 SURGERY — REMOVAL PORT-A-CATH
Anesthesia: Monitor Anesthesia Care | Site: Chest

## 2019-07-06 MED ORDER — HYDROCODONE-ACETAMINOPHEN 5-325 MG PO TABS: 1.0000 | ORAL_TABLET | Freq: Four times a day (QID) | ORAL | 0 refills | Status: DC | PRN

## 2019-07-06 MED ORDER — LACTATED RINGERS IV SOLN: INTRAVENOUS | Status: DC

## 2019-07-06 MED ORDER — PROMETHAZINE HCL 25 MG/ML IJ SOLN: 6.2500 mg | INTRAMUSCULAR | Status: DC | PRN

## 2019-07-06 MED ORDER — FENTANYL CITRATE (PF) 100 MCG/2ML IJ SOLN
INTRAMUSCULAR | Status: DC | PRN
  Administered 2019-07-06: 100 ug via INTRAVENOUS

## 2019-07-06 MED ORDER — GABAPENTIN 300 MG PO CAPS
ORAL_CAPSULE | ORAL | Status: AC
  Filled 2019-07-06: qty 1

## 2019-07-06 MED ORDER — CELECOXIB 200 MG PO CAPS
200.0000 mg | ORAL_CAPSULE | ORAL | Status: AC
  Administered 2019-07-06: 200 mg via ORAL

## 2019-07-06 MED ORDER — OXYCODONE HCL 5 MG PO TABS
ORAL_TABLET | ORAL | Status: AC
  Filled 2019-07-06: qty 1

## 2019-07-06 MED ORDER — ONDANSETRON HCL 4 MG/2ML IJ SOLN
INTRAMUSCULAR | Status: DC | PRN
  Administered 2019-07-06: 4 mg via INTRAVENOUS

## 2019-07-06 MED ORDER — CHLORHEXIDINE GLUCONATE CLOTH 2 % EX PADS: 6.0000 | MEDICATED_PAD | Freq: Once | CUTANEOUS | Status: DC

## 2019-07-06 MED ORDER — ACETAMINOPHEN 500 MG PO TABS
ORAL_TABLET | ORAL | Status: AC
  Filled 2019-07-06: qty 2

## 2019-07-06 MED ORDER — DEXAMETHASONE SODIUM PHOSPHATE 10 MG/ML IJ SOLN
INTRAMUSCULAR | Status: DC | PRN
Start: 2019-07-06 — End: 2019-07-06
  Administered 2019-07-06: 5 mg via INTRAVENOUS

## 2019-07-06 MED ORDER — OXYCODONE HCL 5 MG PO TABS
5.0000 mg | ORAL_TABLET | Freq: Once | ORAL | Status: AC | PRN
  Administered 2019-07-06: 5 mg via ORAL

## 2019-07-06 MED ORDER — CELECOXIB 200 MG PO CAPS
ORAL_CAPSULE | ORAL | Status: AC
  Filled 2019-07-06: qty 1

## 2019-07-06 MED ORDER — HYDROMORPHONE HCL 1 MG/ML IJ SOLN: 0.2500 mg | INTRAMUSCULAR | Status: DC | PRN

## 2019-07-06 MED ORDER — ACETAMINOPHEN 500 MG PO TABS
1000.0000 mg | ORAL_TABLET | ORAL | Status: AC
  Administered 2019-07-06: 1000 mg via ORAL

## 2019-07-06 MED ORDER — MIDAZOLAM HCL 5 MG/5ML IJ SOLN
INTRAMUSCULAR | Status: DC | PRN
  Administered 2019-07-06: 2 mg via INTRAVENOUS

## 2019-07-06 MED ORDER — OXYCODONE HCL 5 MG/5ML PO SOLN: 5.0000 mg | Freq: Once | ORAL | Status: AC | PRN

## 2019-07-06 MED ORDER — MIDAZOLAM HCL 2 MG/2ML IJ SOLN
INTRAMUSCULAR | Status: AC
  Filled 2019-07-06: qty 2

## 2019-07-06 MED ORDER — FENTANYL CITRATE (PF) 100 MCG/2ML IJ SOLN
INTRAMUSCULAR | Status: AC
  Filled 2019-07-06: qty 2

## 2019-07-06 MED ORDER — LIDOCAINE HCL 1 % IJ SOLN
INTRAMUSCULAR | Status: DC | PRN
  Administered 2019-07-06: 14 mL

## 2019-07-06 MED ORDER — LIDOCAINE HCL (PF) 1 % IJ SOLN
INTRAMUSCULAR | Status: AC
  Filled 2019-07-06: qty 30

## 2019-07-06 MED ORDER — GABAPENTIN 300 MG PO CAPS
300.0000 mg | ORAL_CAPSULE | ORAL | Status: AC
  Administered 2019-07-06: 300 mg via ORAL

## 2019-07-06 MED ORDER — PROPOFOL 500 MG/50ML IV EMUL
INTRAVENOUS | Status: DC | PRN
  Administered 2019-07-06: 200 ug/kg/min via INTRAVENOUS

## 2019-07-06 MED ORDER — BUPIVACAINE-EPINEPHRINE 0.25% -1:200000 IJ SOLN
INTRAMUSCULAR | Status: DC | PRN
  Administered 2019-07-06: 14 mL

## 2019-07-06 SURGICAL SUPPLY — 28 items
BLADE SURG 15 STRL LF DISP TIS (BLADE) ×1 IMPLANT
BLADE SURG 15 STRL SS (BLADE) ×1
CHLORAPREP W/TINT 26 (MISCELLANEOUS) ×2 IMPLANT
COVER BACK TABLE 60X90IN (DRAPES) ×2 IMPLANT
COVER MAYO STAND STRL (DRAPES) ×2 IMPLANT
COVER WAND RF STERILE (DRAPES) IMPLANT
DECANTER SPIKE VIAL GLASS SM (MISCELLANEOUS) ×2 IMPLANT
DERMABOND ADVANCED (GAUZE/BANDAGES/DRESSINGS) ×1
DERMABOND ADVANCED .7 DNX12 (GAUZE/BANDAGES/DRESSINGS) ×1 IMPLANT
DRAPE LAPAROTOMY 100X72 PEDS (DRAPES) ×2 IMPLANT
DRAPE UTILITY XL STRL (DRAPES) ×2 IMPLANT
ELECT COATED BLADE 2.86 ST (ELECTRODE) IMPLANT
ELECT REM PT RETURN 9FT ADLT (ELECTROSURGICAL) ×2
ELECTRODE REM PT RTRN 9FT ADLT (ELECTROSURGICAL) ×1 IMPLANT
GLOVE BIO SURGEON STRL SZ7.5 (GLOVE) ×2 IMPLANT
GLOVE BIOGEL M 6.5 STRL (GLOVE) ×2 IMPLANT
GLOVE SURG SS PI 7.0 STRL IVOR (GLOVE) ×2 IMPLANT
GOWN STRL REUS W/ TWL LRG LVL3 (GOWN DISPOSABLE) ×2 IMPLANT
GOWN STRL REUS W/TWL LRG LVL3 (GOWN DISPOSABLE) ×2
NEEDLE HYPO 25X1 1.5 SAFETY (NEEDLE) ×2 IMPLANT
PACK BASIN DAY SURGERY FS (CUSTOM PROCEDURE TRAY) ×2 IMPLANT
PENCIL SMOKE EVACUATOR (MISCELLANEOUS) ×2 IMPLANT
SLEEVE SCD COMPRESS KNEE MED (MISCELLANEOUS) IMPLANT
SUT MON AB 4-0 PC3 18 (SUTURE) ×2 IMPLANT
SUT VIC AB 3-0 SH 27 (SUTURE) ×1
SUT VIC AB 3-0 SH 27X BRD (SUTURE) ×1 IMPLANT
SYR CONTROL 10ML LL (SYRINGE) ×2 IMPLANT
TOWEL GREEN STERILE FF (TOWEL DISPOSABLE) ×2 IMPLANT

## 2019-07-06 NOTE — Anesthesia Preprocedure Evaluation (Addendum)
Anesthesia Evaluation  Patient identified by MRN, date of birth, ID band Patient awake    Reviewed: Allergy & Precautions, NPO status , Patient's Chart, lab work & pertinent test results  Airway Mallampati: I  TM Distance: >3 FB Neck ROM: Full    Dental no notable dental hx.    Pulmonary former smoker,    Pulmonary exam normal breath sounds clear to auscultation       Cardiovascular negative cardio ROS Normal cardiovascular exam Rhythm:Regular Rate:Normal     Neuro/Psych PSYCHIATRIC DISORDERS Anxiety negative neurological ROS     GI/Hepatic negative GI ROS, Neg liver ROS,   Endo/Other  negative endocrine ROS  Renal/GU negative Renal ROS     Musculoskeletal negative musculoskeletal ROS (+)   Abdominal (+) + obese,   Peds  Hematology negative hematology ROS (+)   Anesthesia Other Findings right breast cancer  Reproductive/Obstetrics hcg negative                            Anesthesia Physical Anesthesia Plan  ASA: II  Anesthesia Plan: MAC   Post-op Pain Management:    Induction: Intravenous  PONV Risk Score and Plan: 2 and Ondansetron, Dexamethasone, Propofol infusion, Midazolam and Treatment may vary due to age or medical condition  Airway Management Planned: Simple Face Mask  Additional Equipment:   Intra-op Plan:   Post-operative Plan:   Informed Consent: I have reviewed the patients History and Physical, chart, labs and discussed the procedure including the risks, benefits and alternatives for the proposed anesthesia with the patient or authorized representative who has indicated his/her understanding and acceptance.     Dental advisory given  Plan Discussed with: CRNA  Anesthesia Plan Comments:        Anesthesia Quick Evaluation

## 2019-07-06 NOTE — Interval H&P Note (Signed)
History and Physical Interval Note:  07/06/2019 2:30 PM  Kaylee Romero  has presented today for surgery, with the diagnosis of right breast cancer.  The various methods of treatment have been discussed with the patient and family. After consideration of risks, benefits and other options for treatment, the patient has consented to  Procedure(s): REMOVAL PORT-A-CATH (N/A) as a surgical intervention.  The patient's history has been reviewed, patient examined, no change in status, stable for surgery.  I have reviewed the patient's chart and labs.  Questions were answered to the patient's satisfaction.     Autumn Messing III

## 2019-07-06 NOTE — Op Note (Signed)
07/06/2019  3:47 PM  PATIENT:  Kaylee Romero  39 y.o. female  PRE-OPERATIVE DIAGNOSIS:  right breast cancer  POST-OPERATIVE DIAGNOSIS:  right breast cancer  PROCEDURE:  Procedure(s): REMOVAL PORT-A-CATH (N/A)  SURGEON:  Surgeon(s) and Role:    * Jovita Kussmaul, MD - Primary  PHYSICIAN ASSISTANT:   ASSISTANTS: none   ANESTHESIA:   local and IV sedation  EBL:  15 mL   BLOOD ADMINISTERED:none  DRAINS: none   LOCAL MEDICATIONS USED:  MARCAINE    and LIDOCAINE   SPECIMEN:  No Specimen  DISPOSITION OF SPECIMEN:  N/A  COUNTS:  YES  TOURNIQUET:  * No tourniquets in log *  DICTATION: .Dragon Dictation   After informed consent was obtained the patient was brought to the operating room and placed in the supine position on the operating table.  After adequate IV sedation had been given the patient's left chest was prepped with ChloraPrep, allowed to dry, and draped in usual sterile manner.  An appropriate timeout was performed.  The area around the port was infiltrated with a combination of 1% lidocaine and quarter percent Marcaine until a good field block was created.  A small incision was made through her previous incision with a 15 blade knife.  The incision was carried through the subcutaneous tissue sharply with a 15 blade knife until the capsule surrounding the port was opened.  The 2 anchoring stitches were divided and removed.  The port was then gently pushed out of its pocket and with gentle traction was removed from the patient without difficulty.  Pressure was held for several minutes until the area was hemostatic.  The tract for the tubing was closed with a figure-of-eight 3-0 Vicryl stitch.  The rest of the subcutaneous tissue was also closed with interrupted 3-0 Vicryl stitches.  The skin was then closed with a running 4-0 Monocryl subcuticular stitch.  Dermabond dressings were applied along with a compressive dressing since she had taken some naproxen earlier in the day and  her skin was generally a little bit oozy.  The patient tolerated the procedure well.  At the end of the case all needle sponge and instrument counts were correct.  The patient was then taken to recovery in stable condition.  PLAN OF CARE: Discharge to home after PACU  PATIENT DISPOSITION:  PACU - hemodynamically stable.   Delay start of Pharmacological VTE agent (>24hrs) due to surgical blood loss or risk of bleeding: not applicable

## 2019-07-06 NOTE — Transfer of Care (Signed)
Immediate Anesthesia Transfer of Care Note  Patient: MYIA BERGH  Procedure(s) Performed: REMOVAL PORT-A-CATH (N/A Chest)  Patient Location: PACU  Anesthesia Type:MAC  Level of Consciousness: awake, alert  and oriented  Airway & Oxygen Therapy: Patient Spontanous Breathing and Patient connected to face mask oxygen  Post-op Assessment: Report given to RN and Post -op Vital signs reviewed and stable  Post vital signs: Reviewed and stable  Last Vitals:  Vitals Value Taken Time  BP    Temp    Pulse 84 07/06/19 1550  Resp 22 07/06/19 1550  SpO2 95 % 07/06/19 1550  Vitals shown include unvalidated device data.  Last Pain:  Vitals:   07/06/19 1455  TempSrc: Oral  PainSc: 0-No pain      Patients Stated Pain Goal: 3 (98/11/91 4782)  Complications: No complications documented.

## 2019-07-06 NOTE — Anesthesia Postprocedure Evaluation (Signed)
Anesthesia Post Note  Patient: Kaylee Romero  Procedure(s) Performed: REMOVAL PORT-A-CATH (N/A Chest)     Patient location during evaluation: PACU Anesthesia Type: MAC Level of consciousness: awake and alert Pain management: pain level controlled Vital Signs Assessment: post-procedure vital signs reviewed and stable Respiratory status: spontaneous breathing, nonlabored ventilation, respiratory function stable and patient connected to nasal cannula oxygen Cardiovascular status: stable and blood pressure returned to baseline Postop Assessment: no apparent nausea or vomiting Anesthetic complications: no   No complications documented.  Last Vitals:  Vitals:   07/06/19 1600 07/06/19 1617  BP: 127/88 124/88  Pulse: 89 86  Resp: (!) 21 16  Temp:  36.7 C  SpO2: 95% 96%    Last Pain:  Vitals:   07/06/19 1638  TempSrc:   PainSc: 0-No pain                 [name]

## 2019-07-06 NOTE — H&P (Signed)
Kaylee Romero  Location: [facility] Patient #: [id number] DOB: 06-10-1980 Married / Language: English / Race: White Female   History of Present Illness  The patient is a 39 year old female who presents for a follow-up for Breast cancer. The patient is a 39 year old white female who is about 6 weeks status post right breast reduction lumpectomy and sentinel node biopsy for a T2 N1 a right breast cancer that was ER and PR positive and HER-2 negative with a Ki-67 of 35%. She denies any significant breast pain. Since her last visit she has started chemotherapy.   Allergies Betadine *ANTISEPTICS & DISINFECTANTS*  Rash. Allergies Reconciled   Medication History Tylenol ('500MG'$  Capsule, Oral) Active. Adderall XR ('20MG'$  Capsule ER 24HR, Oral) Active. Vitamin C ('1000MG'$  Tablet, Oral) Active. Citalopram Hydrobromide ('20MG'$  Tablet, Oral) Active. Clobetasol Propionate (0.05% Cream, External) Active. Dexamethasone ('4MG'$  Tablet, Oral) Active. hydrOXYzine HCl ('10MG'$  Tablet, Oral) Active. Lidocaine-Prilocaine (2.5-2.5% Cream, External) Active. Loratadine ('10MG'$  Tablet, Oral) Active. LORazepam (0.'5MG'$  Tablet, Oral) Active. Normodyne ('200MG'$  Tablet, Oral) Active. Prenatal Multivit-Min-Fe-FA (0.'4MG'$  Tablet Chewable, Oral) Active. Tetrahydrozoline HCl (0.05% Solution, Ophthalmic) Active. Multi Vitamin (Oral) Active. Naproxen Sodium ('220MG'$  Tablet, Oral) Active. Prochlorperazine Maleate ('10MG'$  Tablet, Oral) Active. Triamcinolone Acetonide (0.025% Ointment, External) Active. Vitamin B12 (1000MCG Tablet ER, Oral) Active. Medications Reconciled    Review of Systems  General Present- Fatigue and Night Sweats. Not Present- Appetite Loss, Chills, Fever, Weight Gain and Weight Loss. Skin Not Present- Change in Wart/Mole, Dryness, Hives, Jaundice, New Lesions, Non-Healing Wounds, Rash and Ulcer. HEENT Not Present- Earache, Hearing Loss, Hoarseness, Nose Bleed, Oral Ulcers,  Ringing in the Ears, Seasonal Allergies, Sinus Pain, Sore Throat, Visual Disturbances, Wears glasses/contact lenses and Yellow Eyes. Respiratory Not Present- Bloody sputum, Chronic Cough, Difficulty Breathing, Snoring and Wheezing. Cardiovascular Not Present- Chest Pain, Difficulty Breathing Lying Down, Leg Cramps, Palpitations, Rapid Heart Rate, Shortness of Breath and Swelling of Extremities. Gastrointestinal Not Present- Abdominal Pain, Bloating, Bloody Stool, Change in Bowel Habits, Chronic diarrhea, Constipation, Difficulty Swallowing, Excessive gas, Gets full quickly at meals, Hemorrhoids, Indigestion, Nausea, Rectal Pain and Vomiting. Female Genitourinary Not Present- Frequency, Nocturia, Painful Urination, Pelvic Pain and Urgency. Musculoskeletal Not Present- Back Pain, Joint Pain, Joint Stiffness, Muscle Pain, Muscle Weakness and Swelling of Extremities. Neurological Present- Headaches. Not Present- Decreased Memory, Fainting, Numbness, Seizures, Tingling, Tremor, Trouble walking and Weakness. Psychiatric Present- Anxiety and Change in Sleep Pattern. Not Present- Bipolar, Depression, Fearful and Frequent crying. Endocrine Not Present- Cold Intolerance, Excessive Hunger, Hair Changes, Heat Intolerance, Hot flashes and New Diabetes. Hematology Present- Easy Bruising. Not Present- Blood Thinners, Excessive bleeding, Gland problems, HIV and Persistent Infections.  Vitals Weight: 180.4 lb Height: 64in Body Surface Area: 1.87 m Body Mass Index: 30.97 kg/m  Temp.: 98.31F (Temporal)  Pulse: 111 (Regular)  P.OX: 98% (Room air) BP: 140/82(Sitting, Left Arm, Standard)       Physical Exam General Mental Status-Alert. General Appearance-Consistent with stated age. Hydration-Well hydrated. Voice-Normal.  Head and Neck Head-normocephalic, atraumatic with no lesions or palpable masses. Trachea-midline. Thyroid Gland Characteristics - normal size and  consistency.  Eye Eyeball - Bilateral-Extraocular movements intact. Sclera/Conjunctiva - Bilateral-No scleral icterus.  Chest and Lung Exam Chest and lung exam reveals -quiet, even and easy respiratory effort with no use of accessory muscles and on auscultation, normal breath sounds, no adventitious sounds and normal vocal resonance. Inspection Chest Wall - Normal. Back - normal.  Breast Note: The right breast reduction lumpectomy scar is healing nicely with no sign of infection or  seroma. The fullness in the right axilla has significantly decreased.   Cardiovascular Cardiovascular examination reveals -normal heart sounds, regular rate and rhythm with no murmurs and normal pedal pulses bilaterally.  Abdomen Inspection Inspection of the abdomen reveals - No Hernias. Skin - Scar - no surgical scars. Palpation/Percussion Palpation and Percussion of the abdomen reveal - Soft, Non Tender, No Rebound tenderness, No Rigidity (guarding) and No hepatosplenomegaly. Auscultation Auscultation of the abdomen reveals - Bowel sounds normal.  Neurologic Neurologic evaluation reveals -alert and oriented x 3 with no impairment of recent or remote memory. Mental Status-Normal.  Musculoskeletal Normal Exam - Left-Upper Extremity Strength Normal and Lower Extremity Strength Normal. Normal Exam - Right-Upper Extremity Strength Normal and Lower Extremity Strength Normal.  Lymphatic Head & Neck  General Head & Neck Lymphatics: Bilateral - Description - Normal. Axillary  General Axillary Region: Bilateral - Description - Normal. Tenderness - Non Tender. Femoral & Inguinal  Generalized Femoral & Inguinal Lymphatics: Bilateral - Description - Normal. Tenderness - Non Tender.    Assessment & Plan  MALIGNANT NEOPLASM OF UPPER-OUTER QUADRANT OF RIGHT BREAST IN FEMALE, ESTROGEN RECEPTOR POSITIVE (C50.411) Impression: The patient is about 6 weeks status post right breast reduction  lumpectomy for breast cancer. She tolerated the surgery well. At this point she has started chemotherapy. This will be followed by radiation therapy. She will continue to do regular self exams. I will plan to see her back in about 6 months to check her progress. Current Plans Follow up with [place] in the office in 6 MONTHS.   Call us sooner as needed.  She has completed chemotherapy now and presents for port removal.  Risks and benefits of the operation as well as some of the technical aspects of been discussed with the patient and she understands and wishes to proceed

## 2019-07-06 NOTE — Discharge Instructions (Signed)
  NO TYLENOL PRODUCTS UNTIL 9:00 PM  Oxycodone 5mg  given at 4:38 PM     Post Anesthesia Home Care Instructions  Activity: Get plenty of rest for the remainder of the day. A responsible individual must stay with you for 24 hours following the procedure.  For the next 24 hours, DO NOT: -Drive a car -Paediatric nurse -Drink alcoholic beverages -Take any medication unless instructed by your physician -Make any legal decisions or sign important papers.  Meals: Start with liquid foods such as gelatin or soup. Progress to regular foods as tolerated. Avoid greasy, spicy, heavy foods. If nausea and/or vomiting occur, drink only clear liquids until the nausea and/or vomiting subsides. Call your physician if vomiting continues.  Special Instructions/Symptoms: Your throat may feel dry or sore from the anesthesia or the breathing tube placed in your throat during surgery. If this causes discomfort, gargle with warm salt water. The discomfort should disappear within 24 hours.  If you had a scopolamine patch placed behind your ear for the management of post- operative nausea and/or vomiting:  1. The medication in the patch is effective for 72 hours, after which it should be removed.  Wrap patch in a tissue and discard in the trash. Wash hands thoroughly with soap and water. 2. You may remove the patch earlier than 72 hours if you experience unpleasant side effects which may include dry mouth, dizziness or visual disturbances. 3. Avoid touching the patch. Wash your hands with soap and water after contact with the patch.

## 2019-07-07 ENCOUNTER — Encounter (HOSPITAL_BASED_OUTPATIENT_CLINIC_OR_DEPARTMENT_OTHER): Payer: Self-pay | Admitting: General Surgery

## 2019-07-07 ENCOUNTER — Ambulatory Visit: Payer: BC Managed Care – PPO

## 2019-07-11 ENCOUNTER — Ambulatory Visit
Admission: RE | Admit: 2019-07-11 | Discharge: 2019-07-11 | Disposition: A | Discharge status: Home / Self Care | Payer: BC Managed Care – PPO | Source: Ambulatory Visit | Attending: Radiation Oncology | Admitting: Radiation Oncology

## 2019-07-11 DIAGNOSIS — C50411 Malignant neoplasm of upper-outer quadrant of right female breast: Secondary | ICD-10-CM | POA: Diagnosis not present

## 2019-07-12 ENCOUNTER — Ambulatory Visit
Admission: RE | Admit: 2019-07-12 | Discharge: 2019-07-12 | Disposition: A | Discharge status: Home / Self Care | Payer: BC Managed Care – PPO | Source: Ambulatory Visit | Attending: Radiation Oncology | Admitting: Radiation Oncology

## 2019-07-12 DIAGNOSIS — C50411 Malignant neoplasm of upper-outer quadrant of right female breast: Secondary | ICD-10-CM | POA: Diagnosis not present

## 2019-07-13 ENCOUNTER — Ambulatory Visit
Admission: RE | Admit: 2019-07-13 | Discharge: 2019-07-13 | Disposition: A | Discharge status: Home / Self Care | Payer: BC Managed Care – PPO | Source: Ambulatory Visit | Attending: Radiation Oncology | Admitting: Radiation Oncology

## 2019-07-13 DIAGNOSIS — C50411 Malignant neoplasm of upper-outer quadrant of right female breast: Secondary | ICD-10-CM | POA: Diagnosis not present

## 2019-07-14 ENCOUNTER — Ambulatory Visit
Admission: RE | Admit: 2019-07-14 | Discharge: 2019-07-14 | Disposition: A | Discharge status: Home / Self Care | Payer: BC Managed Care – PPO | Source: Ambulatory Visit | Attending: Radiation Oncology | Admitting: Radiation Oncology

## 2019-07-14 DIAGNOSIS — C50411 Malignant neoplasm of upper-outer quadrant of right female breast: Secondary | ICD-10-CM | POA: Diagnosis not present

## 2019-07-16 ENCOUNTER — Other Ambulatory Visit: Payer: Self-pay | Admitting: Adult Health

## 2019-07-16 DIAGNOSIS — Z170 Estrogen receptor positive status [ER+]: Secondary | ICD-10-CM

## 2019-07-17 ENCOUNTER — Ambulatory Visit
Admission: RE | Admit: 2019-07-17 | Discharge: 2019-07-17 | Disposition: A | Discharge status: Home / Self Care | Payer: BC Managed Care – PPO | Source: Ambulatory Visit | Attending: Radiation Oncology | Admitting: Radiation Oncology

## 2019-07-17 DIAGNOSIS — C50411 Malignant neoplasm of upper-outer quadrant of right female breast: Secondary | ICD-10-CM | POA: Diagnosis not present

## 2019-07-18 ENCOUNTER — Ambulatory Visit
Admission: RE | Admit: 2019-07-18 | Discharge: 2019-07-18 | Disposition: A | Discharge status: Home / Self Care | Payer: BC Managed Care – PPO | Source: Ambulatory Visit | Attending: Radiation Oncology | Admitting: Radiation Oncology

## 2019-07-18 DIAGNOSIS — C50411 Malignant neoplasm of upper-outer quadrant of right female breast: Secondary | ICD-10-CM | POA: Diagnosis not present

## 2019-07-19 ENCOUNTER — Inpatient Hospital Stay: Payer: BC Managed Care – PPO | Attending: Radiation Oncology

## 2019-07-19 ENCOUNTER — Ambulatory Visit
Admission: RE | Admit: 2019-07-19 | Discharge: 2019-07-19 | Disposition: A | Discharge status: Home / Self Care | Payer: BC Managed Care – PPO | Source: Ambulatory Visit | Attending: Radiation Oncology | Admitting: Radiation Oncology

## 2019-07-19 ENCOUNTER — Other Ambulatory Visit: Payer: Self-pay

## 2019-07-19 DIAGNOSIS — C50411 Malignant neoplasm of upper-outer quadrant of right female breast: Secondary | ICD-10-CM | POA: Diagnosis not present

## 2019-07-19 DIAGNOSIS — Z170 Estrogen receptor positive status [ER+]: Secondary | ICD-10-CM | POA: Insufficient documentation

## 2019-07-19 DIAGNOSIS — Z79810 Long term (current) use of selective estrogen receptor modulators (SERMs): Secondary | ICD-10-CM | POA: Diagnosis not present

## 2019-07-19 LAB — CBC
HCT: 40.4 % (ref 36.0–46.0)
Hemoglobin: 14.2 g/dL (ref 12.0–15.0)
MCH: 31.9 pg (ref 26.0–34.0)
MCHC: 35.1 g/dL (ref 30.0–36.0)
MCV: 90.8 fL (ref 80.0–100.0)
Platelets: 262 10*3/uL (ref 150–400)
RBC: 4.45 MIL/uL (ref 3.87–5.11)
RDW: 12.8 % (ref 11.5–15.5)
WBC: 7.1 10*3/uL (ref 4.0–10.5)
nRBC: 0 % (ref 0.0–0.2)

## 2019-07-20 ENCOUNTER — Ambulatory Visit
Admission: RE | Admit: 2019-07-20 | Discharge: 2019-07-20 | Disposition: A | Discharge status: Home / Self Care | Payer: BC Managed Care – PPO | Source: Ambulatory Visit | Attending: Radiation Oncology | Admitting: Radiation Oncology

## 2019-07-20 DIAGNOSIS — C50411 Malignant neoplasm of upper-outer quadrant of right female breast: Secondary | ICD-10-CM | POA: Diagnosis not present

## 2019-07-21 ENCOUNTER — Ambulatory Visit
Admission: RE | Admit: 2019-07-21 | Discharge: 2019-07-21 | Disposition: A | Discharge status: Home / Self Care | Payer: BC Managed Care – PPO | Source: Ambulatory Visit | Attending: Radiation Oncology | Admitting: Radiation Oncology

## 2019-07-21 DIAGNOSIS — C50411 Malignant neoplasm of upper-outer quadrant of right female breast: Secondary | ICD-10-CM | POA: Diagnosis not present

## 2019-07-24 ENCOUNTER — Ambulatory Visit
Admission: RE | Admit: 2019-07-24 | Discharge: 2019-07-24 | Disposition: A | Discharge status: Home / Self Care | Payer: BC Managed Care – PPO | Source: Ambulatory Visit | Attending: Radiation Oncology | Admitting: Radiation Oncology

## 2019-07-24 DIAGNOSIS — C50411 Malignant neoplasm of upper-outer quadrant of right female breast: Secondary | ICD-10-CM | POA: Diagnosis not present

## 2019-07-25 ENCOUNTER — Ambulatory Visit
Admission: RE | Admit: 2019-07-25 | Discharge: 2019-07-25 | Disposition: A | Discharge status: Home / Self Care | Payer: BC Managed Care – PPO | Source: Ambulatory Visit | Attending: Radiation Oncology | Admitting: Radiation Oncology

## 2019-07-25 DIAGNOSIS — C50411 Malignant neoplasm of upper-outer quadrant of right female breast: Secondary | ICD-10-CM | POA: Diagnosis not present

## 2019-07-26 ENCOUNTER — Ambulatory Visit
Admission: RE | Admit: 2019-07-26 | Discharge: 2019-07-26 | Disposition: A | Discharge status: Home / Self Care | Payer: BC Managed Care – PPO | Source: Ambulatory Visit | Attending: Radiation Oncology | Admitting: Radiation Oncology

## 2019-07-26 DIAGNOSIS — C50411 Malignant neoplasm of upper-outer quadrant of right female breast: Secondary | ICD-10-CM | POA: Diagnosis not present

## 2019-07-27 ENCOUNTER — Ambulatory Visit
Admission: RE | Admit: 2019-07-27 | Discharge: 2019-07-27 | Disposition: A | Discharge status: Home / Self Care | Payer: BC Managed Care – PPO | Source: Ambulatory Visit | Attending: Radiation Oncology | Admitting: Radiation Oncology

## 2019-07-27 DIAGNOSIS — C50411 Malignant neoplasm of upper-outer quadrant of right female breast: Secondary | ICD-10-CM | POA: Diagnosis not present

## 2019-07-28 ENCOUNTER — Ambulatory Visit
Admission: RE | Admit: 2019-07-28 | Discharge: 2019-07-28 | Disposition: A | Discharge status: Home / Self Care | Payer: BC Managed Care – PPO | Source: Ambulatory Visit | Attending: Radiation Oncology | Admitting: Radiation Oncology

## 2019-07-28 DIAGNOSIS — C50411 Malignant neoplasm of upper-outer quadrant of right female breast: Secondary | ICD-10-CM | POA: Diagnosis not present

## 2019-07-31 ENCOUNTER — Ambulatory Visit
Admission: RE | Admit: 2019-07-31 | Discharge: 2019-07-31 | Disposition: A | Discharge status: Home / Self Care | Payer: BC Managed Care – PPO | Source: Ambulatory Visit | Attending: Radiation Oncology | Admitting: Radiation Oncology

## 2019-07-31 DIAGNOSIS — C50411 Malignant neoplasm of upper-outer quadrant of right female breast: Secondary | ICD-10-CM | POA: Diagnosis not present

## 2019-08-01 ENCOUNTER — Ambulatory Visit: Payer: BC Managed Care – PPO

## 2019-08-01 ENCOUNTER — Other Ambulatory Visit: Payer: Self-pay | Admitting: Licensed Clinical Social Worker

## 2019-08-01 ENCOUNTER — Ambulatory Visit
Admission: RE | Admit: 2019-08-01 | Discharge: 2019-08-01 | Disposition: A | Discharge status: Home / Self Care | Payer: BC Managed Care – PPO | Source: Ambulatory Visit | Attending: Radiation Oncology | Admitting: Radiation Oncology

## 2019-08-01 DIAGNOSIS — Z170 Estrogen receptor positive status [ER+]: Secondary | ICD-10-CM

## 2019-08-01 DIAGNOSIS — C50411 Malignant neoplasm of upper-outer quadrant of right female breast: Secondary | ICD-10-CM | POA: Diagnosis not present

## 2019-08-01 MED ORDER — SILVER SULFADIAZINE 1 % EX CREA: 1.0000 "application " | TOPICAL_CREAM | Freq: Every day | CUTANEOUS | 0 refills | Status: DC

## 2019-08-02 ENCOUNTER — Other Ambulatory Visit: Payer: Self-pay

## 2019-08-02 ENCOUNTER — Ambulatory Visit
Admission: RE | Admit: 2019-08-02 | Discharge: 2019-08-02 | Disposition: A | Discharge status: Home / Self Care | Payer: BC Managed Care – PPO | Source: Ambulatory Visit | Attending: Radiation Oncology | Admitting: Radiation Oncology

## 2019-08-02 ENCOUNTER — Inpatient Hospital Stay: Payer: BC Managed Care – PPO

## 2019-08-02 DIAGNOSIS — C50411 Malignant neoplasm of upper-outer quadrant of right female breast: Secondary | ICD-10-CM | POA: Diagnosis not present

## 2019-08-02 LAB — CBC
HCT: 40 % (ref 36.0–46.0)
Hemoglobin: 14.1 g/dL (ref 12.0–15.0)
MCH: 32.1 pg (ref 26.0–34.0)
MCHC: 35.3 g/dL (ref 30.0–36.0)
MCV: 91.1 fL (ref 80.0–100.0)
Platelets: 291 10*3/uL (ref 150–400)
RBC: 4.39 MIL/uL (ref 3.87–5.11)
RDW: 12.6 % (ref 11.5–15.5)
WBC: 6.5 10*3/uL (ref 4.0–10.5)
nRBC: 0 % (ref 0.0–0.2)

## 2019-08-03 ENCOUNTER — Ambulatory Visit
Admission: RE | Admit: 2019-08-03 | Discharge: 2019-08-03 | Disposition: A | Discharge status: Home / Self Care | Payer: BC Managed Care – PPO | Source: Ambulatory Visit | Attending: Radiation Oncology | Admitting: Radiation Oncology

## 2019-08-03 DIAGNOSIS — C50411 Malignant neoplasm of upper-outer quadrant of right female breast: Secondary | ICD-10-CM | POA: Diagnosis not present

## 2019-08-04 ENCOUNTER — Ambulatory Visit
Admission: RE | Admit: 2019-08-04 | Discharge: 2019-08-04 | Disposition: A | Discharge status: Home / Self Care | Payer: BC Managed Care – PPO | Source: Ambulatory Visit | Attending: Radiation Oncology | Admitting: Radiation Oncology

## 2019-08-04 DIAGNOSIS — C50411 Malignant neoplasm of upper-outer quadrant of right female breast: Secondary | ICD-10-CM | POA: Diagnosis not present

## 2019-08-06 NOTE — Progress Notes (Signed)
[facility]  [phone] [phone]     ID: Kaylee Romero DOB: 27-Aug-1980  MR#: [id number]  FGH#:[id number]  Patient Care Team: Linda Hedges, DO as PCP - General (Obstetrics and Gynecology) Mauro Kaufmann, RN as Oncology Nurse Navigator Rockwell Germany, RN as Oncology Nurse Navigator Gery Pray, MD as Consulting Physician (Radiation Oncology) Shonta Phillis, Virgie Dad, MD as Consulting Physician (Oncology) Jovita Kussmaul, MD as Consulting Physician (General Surgery) Dillingham, Loel Lofty, DO as Attending Physician (Plastic Surgery) Noreene Filbert, MD as Radiation Oncologist (Radiation Oncology) Chauncey Cruel, MD OTHER MD:  CHIEF COMPLAINT: estrogen receptor positive breast cancer  CURRENT TREATMENT: Tamoxifen   INTERVAL HISTORY: [name]  returns today for follow-up of her estrogen receptor positive breast cancer.   Since her last visit, she began adjuvant radiation therapy on 06/21/2019 under Dr. Baruch Gouty. Her final treatment is scheduled for 08/11/2019.   REVIEW OF SYSTEMS: Tabbatha still feels fairly tired with simple activities of daily living.  Of course she does have a 39-year-old which is in excess of exhausting.  She has some soreness in the irradiated breast, and some peeling.  She is using the appropriate creams.  Her hair is coming quite strong, a little darker than it used to be, and more straight than expected.  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  A detailed review of systems today was otherwise stable   HISTORY OF CURRENT ILLNESS: From the original intake note:  Kaylee Romero (pronounced "Bram-Hall") found a palpable lump in the 12 o'clock position of the right breast, which he brought to the attention of her gynecologist.. She underwent bilateral diagnostic  mammography with tomography and bilateral breast ultrasonography at [facility] on 09/23/2018 showing: breast density category C; a spiculated mass with associated with architectural distortion and pleomorphic calcifications lying in the 11-12 o'clock position, middle depth, measuring 2 cm in size, corresponding to the palpable abnormality.   Physical exam confirmed a firm but mobile mass in the 12 o'clock position of the right breast.  By ultrasound there was a highly suspicious 2.1 cm mass in the 12 o'clock position of the right breast; no other evidence of breast malignancy or lymphadenopathy bilaterally.Sonographic evaluation of the right axilla found no enlarged or abnormal appearing lymph node  In the left axilla there were several lymph nodes with areas of cortical prominence similar to the lymph node noted on the right.  Given the symmetry all these are presumed to be reactive  Accordingly on 09/26/2018 she proceeded to biopsy of the right breast area in question. The pathology from this procedure ([id number]) showed: invasive ductal carcinoma, grade 2; prognostic indicators significant for: estrogen receptor, 80% positive with moderate staining intensity and progesterone receptor, 90% positive with strong staining intensity. Proliferation marker Ki67 at 35%. HER2 negative by immunohistochemistry (1+).  The patient's subsequent history is as detailed below.   PAST MEDICAL HISTORY: Past Medical History:  Diagnosis Date  . Anxiety   . Family history of breast cancer   . HSV (herpes simplex virus) anogenital infection    positive titer only    PAST SURGICAL HISTORY: Past Surgical History:  Procedure Laterality Date  . BREAST LUMPECTOMY WITH AXILLARY LYMPH NODE BIOPSY Right 11/21/2018   Procedure: RIGHT BREAST LUMPECTOMY WITH SENTINEL LYMPH NODE BIOPSY;  Surgeon: Jovita Kussmaul, MD;  Location: [facility];  Service: General;  Laterality: Right;  . BREAST REDUCTION SURGERY Bilateral  11/21/2018   Procedure: BILATERAL MAMMARY REDUCTION  (BREAST);  Surgeon: Wallace Going, DO;  Location: [facility];  Service: Plastics;  Laterality: Bilateral;  . PORT-A-CATH REMOVAL N/A 07/06/2019   Procedure: REMOVAL PORT-A-CATH;  Surgeon: Jovita Kussmaul, MD;  Location: [facility];  Service: General;  Laterality: N/A;  . PORTACATH PLACEMENT N/A 11/21/2018   Procedure: INSERTION LEFT PORT-A-CATH WITH ULTRASOUND GUIDANCE;  Surgeon: Jovita Kussmaul, MD;  Location: [facility];  Service: General;  Laterality: N/A;  . TONSILLECTOMY  1987  . TONSILLECTOMY    . WISDOM TOOTH EXTRACTION      FAMILY HISTORY: Family History  Problem Relation Age of Onset  . Hypertension Father   . Alcohol abuse Paternal Grandfather   . Heart disease Paternal Grandfather   . Alcohol abuse Maternal Grandfather   . Heart disease Maternal Grandmother   . Breast cancer Other    Patient's parents are both living at age 69, as of 09/2018. Her father lives in [place], and her mother lives in [place], [place]. The patient denies a family hx of ovarian cancer. A maternal great aunt was diagnosed with breast cancer twice. She has 2 brothers, no sisters   GYNECOLOGIC HISTORY:  No LMP recorded. (Menstrual status: Chemotherapy). Menarche: 39 years old Age at first live birth: 39 years old Venetie P 1 Currently having regular periods  Contraceptive: Mirena IUD in place HRT n/a  Hysterectomy? no BSO? no   SOCIAL HISTORY: (updated January 2021) Shae is a [employer]. Husband Tommi Rumps is in management with [employer]. Daughter Normand Sloop is 71 months old.     ADVANCED DIRECTIVES: not in place. 39 husband is automatically her HCPOA.   HEALTH MAINTENANCE: Social History   Tobacco Use  . Smoking status: Former Smoker    Packs/day: 0.50    Years: 10.00    Pack years: 5.00    Types: Cigarettes    Start date: 01/13/2002    Quit date: 10/17/2016    Years since quitting: 2.8  . Smokeless tobacco: Never Used    Vaping Use  . Vaping Use: Never used  Substance Use Topics  . Alcohol use: Yes    Comment: Socially  . Drug use: No     Colonoscopy: n/a  PAP: 09/20/2018  Bone density: n/a   Allergies  Allergen Reactions  . Betadine [Povidone Iodine] Rash    Current Outpatient Medications  Medication Sig Dispense Refill  . acetaminophen (TYLENOL) 500 MG tablet Take 1 tablet (500 mg total) by mouth every 6 (six) hours as needed. 60 tablet 0  . amphetamine-dextroamphetamine (ADDERALL XR) 20 MG 24 hr capsule Take 20 mg by mouth daily.    . Ascorbic Acid (VITAMIN C) 1000 MG tablet Take 1,000 mg by mouth daily.    Marland Kitchen gabapentin (NEURONTIN) 300 MG capsule Take 1 capsule (300 mg total) by mouth at bedtime. 90 capsule 4  . HYDROcodone-acetaminophen (NORCO/VICODIN) 5-325 MG tablet Take 1-2 tablets by mouth every 6 (six) hours as needed for moderate pain or severe pain. 5 tablet 0  . hydrOXYzine (ATARAX/VISTARIL) 10 MG tablet Take 10 mg by mouth at bedtime.     Marland Kitchen LORazepam (ATIVAN) 0.5 MG tablet Take 1 tablet (0.5 mg total) by mouth at bedtime as needed (Nausea or vomiting). 30 tablet 0  . Multiple Vitamin (MULTIVITAMIN WITH MINERALS) TABS tablet Take 1 tablet by mouth  daily.    . naproxen sodium (ALEVE) 220 MG tablet Take 1 tablet (220 mg total) by mouth 3 (three) times daily with meals. 60 tablet 3  . silver sulfADIAZINE (SILVADENE) 1 % cream Apply 1 application topically daily. 50 g 0  . Tetrahydrozoline HCl (VISINE OP) Place 1 drop into both eyes daily as needed (redness).    . venlafaxine XR (EFFEXOR-XR) 75 MG 24 hr capsule Take 1 capsule (75 mg total) by mouth daily. 90 capsule 3  . vitamin B-12 (CYANOCOBALAMIN) 1000 MCG tablet Take 1,000 mcg by mouth daily.     No current facility-administered medications for this visit.   Facility-Administered Medications Ordered in Other Visits  Medication Dose Route Frequency Provider Last Rate Last Admin  . sodium chloride flush (NS) 0.9 % injection 10 mL  10  mL Intracatheter PRN Annaliyah Willig, Virgie Dad, MD   10 mL at 05/18/19 1553    OBJECTIVE: White woman who appears stated age  28:   08/07/19 1032  BP: 123/67  Pulse: 92  Resp: 18  Temp: 98.9 F (37.2 C)  SpO2: 100%   Wt Readings from Last 3 Encounters:  08/07/19 182 lb 11.2 oz (82.9 kg)  07/06/19 181 lb 3.5 oz (82.2 kg)  06/01/19 184 lb 12.8 oz (83.8 kg)   Body mass index is 31.36 kg/m.    ECOG FS:1 - Symptomatic but completely ambulatory  Sclerae unicteric, EOMs intact Wearing a mask No cervical or supraclavicular adenopathy Lungs no rales or rhonchi Heart regular rate and rhythm Abd soft, nontender, positive bowel sounds MSK no focal spinal tenderness, no upper extremity lymphedema Neuro: nonfocal, well oriented, appropriate affect Breasts: The right breast is status post lumpectomy and radiation.  There is some erythema but also significant desquamation in the supraclavicular area on the right, the inframammary fold, and the axilla.  This is dry.  Left breast is benign.  Both axillae are benign.   LAB RESULTS:  CMP     Component Value Date/Time   NA 139 07/03/2019 1230   K 3.8 07/03/2019 1230   CL 101 07/03/2019 1230   CO2 28 07/03/2019 1230   GLUCOSE 97 07/03/2019 1230   BUN 14 07/03/2019 1230   CREATININE 0.65 07/03/2019 1230   CREATININE 0.59 06/01/2019 1340   CALCIUM 10.1 07/03/2019 1230   PROT 7.1 06/01/2019 1340   ALBUMIN 4.2 06/01/2019 1340   AST 21 06/01/2019 1340   ALT 26 06/01/2019 1340   ALKPHOS 59 06/01/2019 1340   BILITOT 0.5 06/01/2019 1340   GFRNONAA >60 07/03/2019 1230   GFRNONAA >60 06/01/2019 1340   GFRAA >60 07/03/2019 1230   GFRAA >60 06/01/2019 1340    No results found for: TOTALPROTELP, ALBUMINELP, A1GS, A2GS, BETS, BETA2SER, GAMS, MSPIKE, SPEI  No results found for: KPAFRELGTCHN, LAMBDASER, KAPLAMBRATIO  Lab Results  Component Value Date   WBC 10.5 08/07/2019   NEUTROABS 8.8 (H) 08/07/2019   HGB 15.5 (H) 08/07/2019   HCT  44.8 08/07/2019   MCV 94.1 08/07/2019   PLT 302 08/07/2019    No results found for: LABCA2  No components found for: DSKAJG811  No results for input(s): INR in the last 168 hours.  No results found for: LABCA2  No results found for: XBW620  No results found for: BTD974  No results found for: BUL845  No results found for: CA2729  No components found for: HGQUANT  No results found for: CEA1 / No results found for: CEA1   No results found for: [facility]  No results found for: CHROMOGRNA  No results found for: HGBA, HGBA2QUANT, HGBFQUANT, HGBSQUAN (Hemoglobinopathy evaluation)   No results found for: LDH  No results found for: IRON, TIBC, IRONPCTSAT (Iron and TIBC)  No results found for: FERRITIN  Urinalysis    Component Value Date/Time   BILIRUBINUR Neg 01/18/2012 1458   PROTEINUR Neg 01/18/2012 1458   UROBILINOGEN 0.2 01/18/2012 1458   NITRITE Neg 01/18/2012 1458   LEUKOCYTESUR Negative 01/18/2012 1458    STUDIES: No results found.   ELIGIBLE FOR AVAILABLE RESEARCH PROTOCOL: no  ASSESSMENT: 39 y.o. [place] woman status post right breast upper inner quadrant biopsy on 09/26/2018 for a clinical T2 N0, stage IB invasive ductal carcinoma, grade 2, estrogen and progesterone receptor positive, HER-2 nonamplified, with an MIB-1-1 of 35%  (1) Genetic testing reported on 10/05/2018 through the [facility] Breast Cancer STAT Panel + Common Hereditary cancer panel found no pathogenic mutations. The STAT Breast cancer panel offered by [facility] includes sequencing and rearrangement analysis for the following 9 genes:  ATM, BRCA1, BRCA2, CDH1, CHEK2, PALB2, PTEN, STK11 and TP53. The Common Hereditary Cancers Panel offered by [facility] includes sequencing and/or deletion duplication testing of the following 47 genes: APC, ATM, AXIN2, BARD1, BMPR1A, BRCA1, BRCA2, BRIP1, CDH1, CDKN2A (p14ARF), CDKN2A (p16INK4a), CKD4, CHEK2, CTNNA1, DICER1, EPCAM (Deletion/duplication testing only),  GREM1 (promoter region deletion/duplication testing only), KIT, MEN1, MLH1, MSH2, MSH3, MSH6, MUTYH, NBN, NF1, NHTL1, PALB2, PDGFRA, PMS2, POLD1, POLE, PTEN, RAD50, RAD51C, RAD51D, SDHB, SDHC, SDHD, SMAD4, SMARCA4. STK11, TP53, TSC1, TSC2, and VHL.  The following genes were evaluated for sequence changes only: SDHA and HOXB13 c.251G>A variant only.   (2) MammaPrint obtained from the original breast biopsy returned "high risk" indicating the patient will have an excellent prognosis if she receives adjuvant chemotherapy  (3) status post right lumpectomy and right axillary sentinel lymph node sampling 11/21/2018 for a pT2 pN1, stage IIA invasive ductal carcinoma, grade 2, with negative margins  (a) a total of 4 sentinel lymph nodes removed, 1 with macrometastasis  (b) status post bilateral breast reductions at the time of lumpectomy  (4) adjuvant chemotherapy consisting of cyclophosphamide and doxorubicin in dose dense fashion x4 started 01/05/2019, completed 02/15/2019, followed by weekly paclitaxel x12 started 03/02/2019  (a) echo 10/27/2018 shows an ejection fraction in the 55-60% range  (5) adjuvant radiation to be completed 08/11/2019 ([facility])  (6) tamoxifen started 10/05/2018 in anticipation of surgical delays  (a) discontinued 12/05/2018 during surgery and chemotherapy  (b) to be resumed 09/06/2019  (c) Mirena IUD in place     PLAN: Hazley will complete her radiation treatments this Friday August 11, 2019.  She generally has done well with those except for some desquamation and fatigue.  The fatigue is really the concern.  She has 10-hour days coming up with multiple glasses and only occasional use of an Environmental consultant.  That really is quite a bit for someone just coming out of the chemo and now radiation.  I think it might be possible if she manages to get an assistant on a daily basis for the next month.  Otherwise she may need to postpone going back to work a little.  She will be discussing  this with HR and let us know what she would like to do.  I will be glad to write a letter for her explaining her situation.  She will be ready to start tamoxifen as of 09/06/2019.  She took it before with initial hot flashes which quickly subsided.  She has a  Mirena IUD in place which is very favorable as well.  She understands she will be on this medication a minimum of 5 years.  Total encounter time today 30 minutes.Sarajane Jews C. , MD 08/07/19 10:37 AM Medical Oncology and Hematology [facility] [place], [place] [address] Tel. [phone]    Fax. [phone]   I, Wilburn Mylar, am acting as scribe for Dr. Virgie Dad. .  I, Lurline Del MD, have reviewed the above documentation for accuracy and completeness, and I agree with the above.  *Total Encounter Time as defined by the Centers for Medicare and Medicaid Services includes, in addition to the face-to-face time of a patient visit (documented in the note above) non-face-to-face time: obtaining and reviewing outside history, ordering and reviewing medications, tests or procedures, care coordination (communications with other health care professionals or caregivers) and documentation in the medical record.

## 2019-08-07 ENCOUNTER — Inpatient Hospital Stay: Payer: BC Managed Care – PPO

## 2019-08-07 ENCOUNTER — Inpatient Hospital Stay: Payer: BC Managed Care – PPO | Attending: Oncology | Admitting: Oncology

## 2019-08-07 ENCOUNTER — Other Ambulatory Visit: Payer: Self-pay

## 2019-08-07 ENCOUNTER — Ambulatory Visit
Admission: RE | Admit: 2019-08-07 | Discharge: 2019-08-07 | Disposition: A | Discharge status: Home / Self Care | Payer: BC Managed Care – PPO | Source: Ambulatory Visit | Attending: Radiation Oncology | Admitting: Radiation Oncology

## 2019-08-07 VITALS — BP 123/67 | HR 92 | Temp 98.9°F | Resp 18 | Ht 64.0 in | Wt 182.7 lb

## 2019-08-07 DIAGNOSIS — C50811 Malignant neoplasm of overlapping sites of right female breast: Secondary | ICD-10-CM | POA: Diagnosis not present

## 2019-08-07 DIAGNOSIS — F988 Other specified behavioral and emotional disorders with onset usually occurring in childhood and adolescence: Secondary | ICD-10-CM

## 2019-08-07 DIAGNOSIS — Z170 Estrogen receptor positive status [ER+]: Secondary | ICD-10-CM

## 2019-08-07 DIAGNOSIS — C50411 Malignant neoplasm of upper-outer quadrant of right female breast: Secondary | ICD-10-CM | POA: Diagnosis not present

## 2019-08-07 DIAGNOSIS — R5383 Other fatigue: Secondary | ICD-10-CM | POA: Diagnosis not present

## 2019-08-07 DIAGNOSIS — Z510 Encounter for antineoplastic radiation therapy: Secondary | ICD-10-CM | POA: Diagnosis present

## 2019-08-07 LAB — COMPREHENSIVE METABOLIC PANEL
ALT: 23 U/L (ref 0–44)
AST: 19 U/L (ref 15–41)
Albumin: 4 g/dL (ref 3.5–5.0)
Alkaline Phosphatase: 76 U/L (ref 38–126)
Anion gap: 7 (ref 5–15)
BUN: 12 mg/dL (ref 6–20)
CO2: 28 mmol/L (ref 22–32)
Calcium: 10.6 mg/dL — ABNORMAL HIGH (ref 8.9–10.3)
Chloride: 102 mmol/L (ref 98–111)
Creatinine, Ser: 0.74 mg/dL (ref 0.44–1.00)
GFR calc Af Amer: >60 mL/min (ref >60)
GFR calc non Af Amer: >60 mL/min (ref >60)
Glucose, Bld: 60 mg/dL — ABNORMAL LOW (ref 70–99)
Potassium: 4.3 mmol/L (ref 3.5–5.1)
Sodium: 137 mmol/L (ref 135–145)
Total Bilirubin: 0.5 mg/dL (ref 0.3–1.2)
Total Protein: 7.7 g/dL (ref 6.5–8.1)

## 2019-08-07 LAB — CBC WITH DIFFERENTIAL/PLATELET
Abs Immature Granulocytes: 0.03 10*3/uL (ref 0.00–0.07)
Basophils Absolute: 0.1 10*3/uL (ref 0.0–0.1)
Basophils Relative: 1 %
Eosinophils Absolute: 0.2 10*3/uL (ref 0.0–0.5)
Eosinophils Relative: 2 %
HCT: 44.8 % (ref 36.0–46.0)
Hemoglobin: 15.5 g/dL — ABNORMAL HIGH (ref 12.0–15.0)
Immature Granulocytes: 0 %
Lymphocytes Relative: 7 %
Lymphs Abs: 0.7 10*3/uL (ref 0.7–4.0)
MCH: 32.6 pg (ref 26.0–34.0)
MCHC: 34.6 g/dL (ref 30.0–36.0)
MCV: 94.1 fL (ref 80.0–100.0)
Monocytes Absolute: 0.8 10*3/uL (ref 0.1–1.0)
Monocytes Relative: 7 %
Neutro Abs: 8.8 10*3/uL — ABNORMAL HIGH (ref 1.7–7.7)
Neutrophils Relative %: 83 %
Platelets: 302 10*3/uL (ref 150–400)
RBC: 4.76 MIL/uL (ref 3.87–5.11)
RDW: 12.5 % (ref 11.5–15.5)
WBC: 10.5 10*3/uL (ref 4.0–10.5)
nRBC: 0 % (ref 0.0–0.2)

## 2019-08-07 MED ORDER — TAMOXIFEN CITRATE 20 MG PO TABS
20.0000 mg | ORAL_TABLET | Freq: Every day | ORAL | 4 refills | Status: AC
Start: 2019-08-07 — End: 2019-09-06

## 2019-08-08 ENCOUNTER — Telehealth: Payer: Self-pay | Admitting: *Deleted

## 2019-08-08 ENCOUNTER — Ambulatory Visit
Admission: RE | Admit: 2019-08-08 | Discharge: 2019-08-08 | Disposition: A | Discharge status: Home / Self Care | Payer: BC Managed Care – PPO | Source: Ambulatory Visit | Attending: Radiation Oncology | Admitting: Radiation Oncology

## 2019-08-08 ENCOUNTER — Telehealth: Payer: Self-pay | Admitting: Oncology

## 2019-08-08 DIAGNOSIS — C50411 Malignant neoplasm of upper-outer quadrant of right female breast: Secondary | ICD-10-CM | POA: Diagnosis not present

## 2019-08-08 LAB — LUTEINIZING HORMONE: LH: 27.6 m[IU]/mL

## 2019-08-08 LAB — FOLLICLE STIMULATING HORMONE: FSH: 32.5 m[IU]/mL

## 2019-08-08 NOTE — Telephone Encounter (Signed)
Scheduled appts per 8/2 los. Left voicemail with appt date and time.

## 2019-08-08 NOTE — Telephone Encounter (Signed)
This RN attempted to return call to pt per her VM regarding need to discuss " returning to work "- note per visit MD stated : The fatigue is really the concern.  She has 10-hour days coming up with multiple glasses and only occasional use of an Environmental consultant.  That really is quite a bit for someone just coming out of the chemo and now radiation.  I think it might be possible if she manages to get an assistant on a daily basis for the next month.  Otherwise she may need to postpone going back to work a little.  She will be discussing this with HR and let us know what she would like to do.  I will be glad to write a letter for her explaining her situation.  Obtained VM - message left requesting pt to call again to discuss. This RN's name and return call number given.

## 2019-08-09 ENCOUNTER — Encounter: Payer: Self-pay | Admitting: *Deleted

## 2019-08-09 ENCOUNTER — Ambulatory Visit
Admission: RE | Admit: 2019-08-09 | Discharge: 2019-08-09 | Disposition: A | Discharge status: Home / Self Care | Payer: BC Managed Care – PPO | Source: Ambulatory Visit | Attending: Radiation Oncology | Admitting: Radiation Oncology

## 2019-08-09 DIAGNOSIS — C50411 Malignant neoplasm of upper-outer quadrant of right female breast: Secondary | ICD-10-CM | POA: Diagnosis not present

## 2019-08-10 ENCOUNTER — Encounter: Payer: Self-pay | Admitting: *Deleted

## 2019-08-10 ENCOUNTER — Ambulatory Visit
Admission: RE | Admit: 2019-08-10 | Discharge: 2019-08-10 | Disposition: A | Discharge status: Home / Self Care | Payer: BC Managed Care – PPO | Source: Ambulatory Visit | Attending: Radiation Oncology | Admitting: Radiation Oncology

## 2019-08-10 ENCOUNTER — Ambulatory Visit: Payer: BC Managed Care – PPO

## 2019-08-10 DIAGNOSIS — C50411 Malignant neoplasm of upper-outer quadrant of right female breast: Secondary | ICD-10-CM | POA: Diagnosis not present

## 2019-08-11 ENCOUNTER — Ambulatory Visit
Admission: RE | Admit: 2019-08-11 | Discharge: 2019-08-11 | Disposition: A | Discharge status: Home / Self Care | Payer: BC Managed Care – PPO | Source: Ambulatory Visit | Attending: Radiation Oncology | Admitting: Radiation Oncology

## 2019-08-11 DIAGNOSIS — C50411 Malignant neoplasm of upper-outer quadrant of right female breast: Secondary | ICD-10-CM | POA: Diagnosis not present

## 2019-08-16 LAB — ESTRADIOL, ULTRA SENS: Estradiol, Sensitive: 57.8 pg/mL

## 2019-08-17 ENCOUNTER — Telehealth: Payer: Self-pay | Admitting: Oncology

## 2019-08-17 NOTE — Telephone Encounter (Signed)
Rescheduled appointments per 8/12 scheduling message. Patient is aware of updated appointment date and time.

## 2019-08-22 ENCOUNTER — Other Ambulatory Visit: Payer: Self-pay

## 2019-08-22 ENCOUNTER — Ambulatory Visit (INDEPENDENT_AMBULATORY_CARE_PROVIDER_SITE_OTHER): Payer: BC Managed Care – PPO | Admitting: Plastic Surgery

## 2019-08-22 ENCOUNTER — Encounter: Payer: Self-pay | Admitting: Plastic Surgery

## 2019-08-22 VITALS — BP 112/75 | Temp 98.2°F

## 2019-08-22 DIAGNOSIS — Z9889 Other specified postprocedural states: Secondary | ICD-10-CM

## 2019-08-22 NOTE — Progress Notes (Signed)
   Subjective:    Patient ID: Kaylee Romero, female    DOB: 1980-01-13, 39 y.o.   MRN: [id number]  The patient is a 39 year old female here for follow-up on her bilateral breast surgery.  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  It was estrogen and progesterone positive and HER-2 negative.  She had immediate oncologic reconstruction with bilateral breast reductions.  Her preop bra size was a 36 D/DD.  Her radiation ended 2 weeks ago.  She is doing well overall.  Her skin seems to be healing nicely.  The incisions are all closed.  No sign of seroma or hematoma.  The right is tighter and perkier compared to the left as expected with radiation.     Review of Systems  Constitutional: Negative.   HENT: Negative.   Eyes: Negative.   Respiratory: Negative.   Cardiovascular: Negative.   Gastrointestinal: Negative.   Endocrine: Negative.   Genitourinary: Negative.   Musculoskeletal: Negative.   Skin: Positive for color change. Negative for pallor, rash and wound.  Neurological: Negative.   Hematological: Negative.   Psychiatric/Behavioral: Negative.        Objective:   Physical Exam Vitals and nursing note reviewed.  Constitutional:      Appearance: Normal appearance.  HENT:     Head: Normocephalic and atraumatic.  Cardiovascular:     Rate and Rhythm: Normal rate.     Pulses: Normal pulses.  Pulmonary:     Effort: Pulmonary effort is normal. No respiratory distress.  Abdominal:     General: Abdomen is flat. There is no distension.     Tenderness: There is no abdominal tenderness.  Neurological:     General: No focal deficit present.     Mental Status: She is alert and oriented to person, place, and time.  Psychiatric:        Mood and Affect: Mood normal.        Behavior: Behavior normal.        Thought Content: Thought content normal.         Assessment & Plan:     ICD-10-CM   1. Hx of breast surgery   Z98.890     Continue to do light massage.  Recommend silicone sheets for the scars as well as for her Port-A-Cath site.  She can also use skinuva.  Light massage to the left breast.  Recommend leaving the right breast alone for the next month or 2.  If we need to do a revision to the left side but certainly a possibility that I would not recommend doing it until we are 6 months out from her radiation treatment.  Patient now acknowledges understanding and agreeing with this plan.  We will see her back in 6 months.  Pictures were obtained of the patient and placed in the chart with the patient's or guardian's permission.

## 2019-08-23 ENCOUNTER — Other Ambulatory Visit: Payer: BC Managed Care – PPO

## 2019-08-23 ENCOUNTER — Ambulatory Visit: Payer: BC Managed Care – PPO | Admitting: Oncology

## 2019-09-18 ENCOUNTER — Other Ambulatory Visit: Payer: Self-pay

## 2019-09-18 ENCOUNTER — Encounter: Payer: Self-pay | Admitting: Radiation Oncology

## 2019-09-18 ENCOUNTER — Ambulatory Visit
Admission: RE | Admit: 2019-09-18 | Discharge: 2019-09-18 | Disposition: A | Discharge status: Home / Self Care | Payer: BC Managed Care – PPO | Source: Ambulatory Visit | Attending: Radiation Oncology | Admitting: Radiation Oncology

## 2019-09-18 VITALS — BP 139/90 | HR 96 | Temp 98.0°F | Wt 183.0 lb

## 2019-09-18 DIAGNOSIS — C50411 Malignant neoplasm of upper-outer quadrant of right female breast: Secondary | ICD-10-CM

## 2019-09-18 NOTE — Progress Notes (Signed)
Radiation Oncology Follow up Note  Name: Kaylee Romero   Date:   09/18/2019 MRN:  [id number] DOB: 1980/08/29    This 39 y.o. female presents to the clinic today for 1 month follow-up status post whole breast radiation of the right breast status post wide local excision and sentinel node biopsy as well as adjuvant chemotherapy for stage IIb (T2 N1 aM0).  REFERRING PROVIDER: Linda Hedges, DO  HPI: Patient is a 39 year old female now out 1 month having completed whole breast radiation to her right breast status post wide local excision and sentinel node biopsy and adjuvant chemotherapy for stage IIb invasive mammary carcinoma she is seen today in routine follow-up and is doing well.  She specifically denies breast tenderness cough or bone pain.  Medical oncology is ordered a mammogram for next month..  She is currently on tamoxifen tolerating that well without side effect  COMPLICATIONS OF TREATMENT: None  FOLLOW UP COMPLIANCE: Excellent  PHYSICAL EXAM:  BP 139/90 (BP Location: Left Arm, Patient Position: Sitting, Cuff Size: Normal)   Pulse 96   Temp 98 F (36.7 C) (Tympanic)   Wt 183 lb (83 kg)   BMI 31.41 kg/m  Lungs are clear to A&P cardiac examination essentially unremarkable with regular rate and rhythm. No dominant mass or nodularity is noted in either breast in 2 positions examined. Incision is well-healed. No axillary or supraclavicular adenopathy is appreciated. Cosmetic result is excellent.  Well-developed well-nourished patient in NAD. HEENT reveals PERLA, EOMI, discs not visualized.  Oral cavity is clear. No oral mucosal lesions are identified. Neck is clear without evidence of cervical or supraclavicular adenopathy. Lungs are clear to A&P. Cardiac examination is essentially unremarkable with regular rate and rhythm without murmur rub or thrill. Abdomen is benign with no organomegaly or masses noted. Motor sensory and DTR levels are equal and symmetric in the upper and lower  extremities. Cranial nerves II through XII are grossly intact. Proprioception is intact. No peripheral adenopathy or edema is identified. No motor or sensory levels are noted. Crude visual fields are within normal range.  RADIOLOGY RESULTS: No current films to review  PLAN: Present time patient is doing extremely well status post whole breast radiation.  And pleased with her overall progress.  She continues on tamoxifen without side effect.  I have asked to see her back in 4 to 5 months for follow-up.  She continues close follow-up care with medical oncology.  I would like to take this opportunity to thank you for allowing me to participate in the care of your patient.Noreene Filbert, MD

## 2019-10-10 ENCOUNTER — Telehealth: Payer: Self-pay | Admitting: *Deleted

## 2019-10-10 NOTE — Telephone Encounter (Signed)
Received vm call from pt stating that her HR needs more information for short term disability for state of [place].  They need lab, xray/Ct reports & anything to document her diagnosis.  Fax to [phone] atten: [employer].   Message to Roz to f/u.  Pt also reports needing a letter to return to work on 11/06/19 full time. Per Roz, send to HIM.  This was done.  Asked HIM to let us know when records sent & we can send letter at same time.

## 2019-10-17 ENCOUNTER — Other Ambulatory Visit: Payer: Self-pay

## 2019-10-17 ENCOUNTER — Ambulatory Visit
Admission: RE | Admit: 2019-10-17 | Discharge: 2019-10-17 | Disposition: A | Discharge status: Home / Self Care | Payer: BC Managed Care – PPO | Source: Ambulatory Visit | Attending: Oncology | Admitting: Oncology

## 2019-10-17 ENCOUNTER — Telehealth: Payer: Self-pay

## 2019-10-17 DIAGNOSIS — C50411 Malignant neoplasm of upper-outer quadrant of right female breast: Secondary | ICD-10-CM

## 2019-10-17 DIAGNOSIS — F988 Other specified behavioral and emotional disorders with onset usually occurring in childhood and adolescence: Secondary | ICD-10-CM

## 2019-10-17 IMAGING — MG DIGITAL DIAGNOSTIC BILAT W/ TOMO W/ CAD
6 of 9 series · 6 of 25 positions shown · non-contrast
Comparison: Previous exam(s).

CLINICAL DATA: 38-year-old female presenting for routine annual
surveillance status post right breast lumpectomy in [S0]. She also
had a reduction mammoplasty of both breasts at the time of
lumpectomy.

EXAM:
DIGITAL DIAGNOSTIC BILATERAL MAMMOGRAM WITH TOMO AND CAD

[R CC]
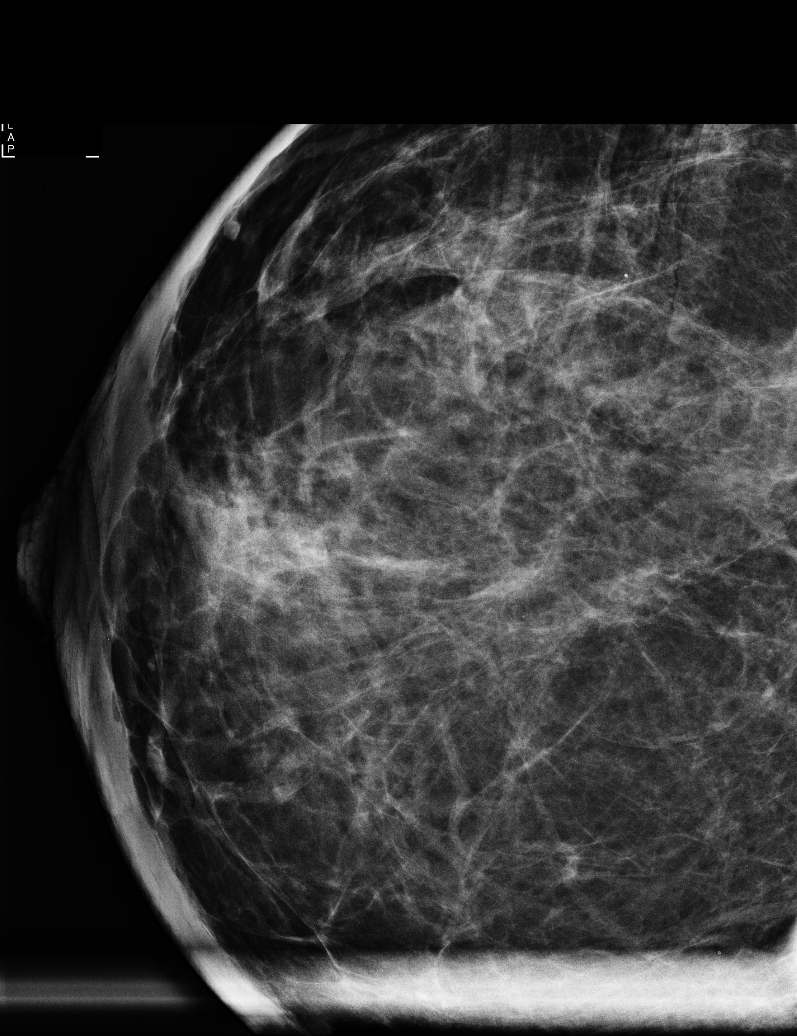

[L MLO synth-2D]
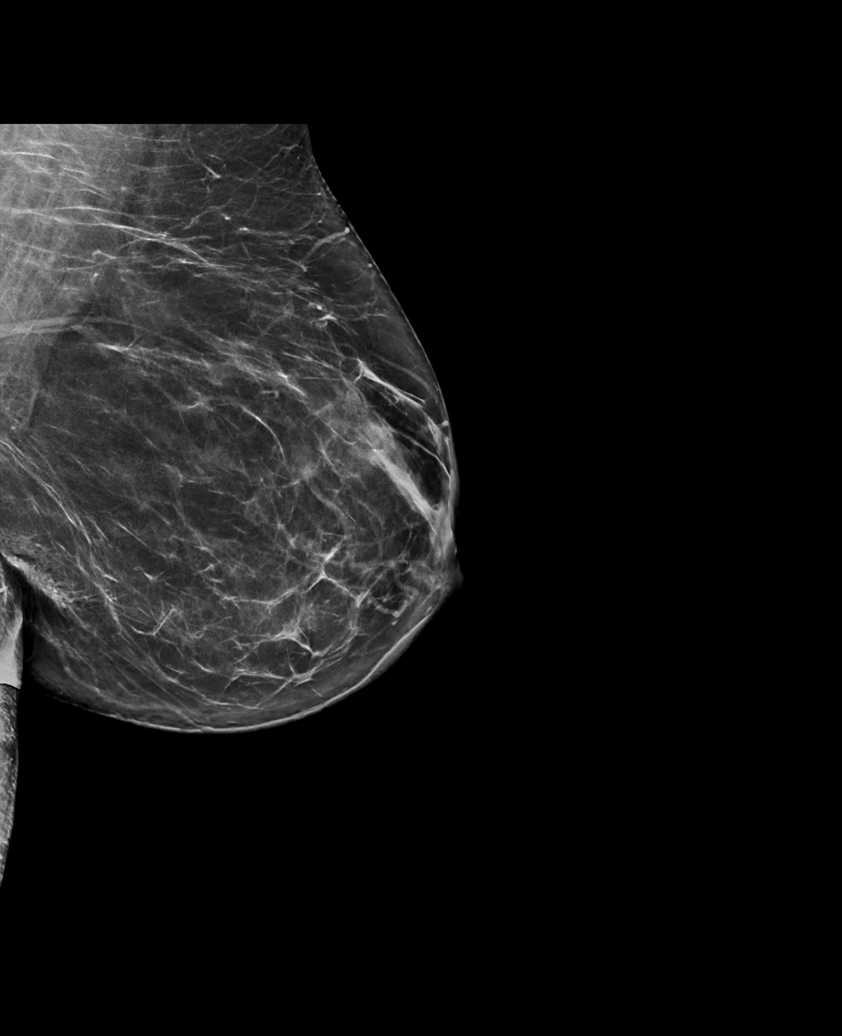

[L CC synth-2D]
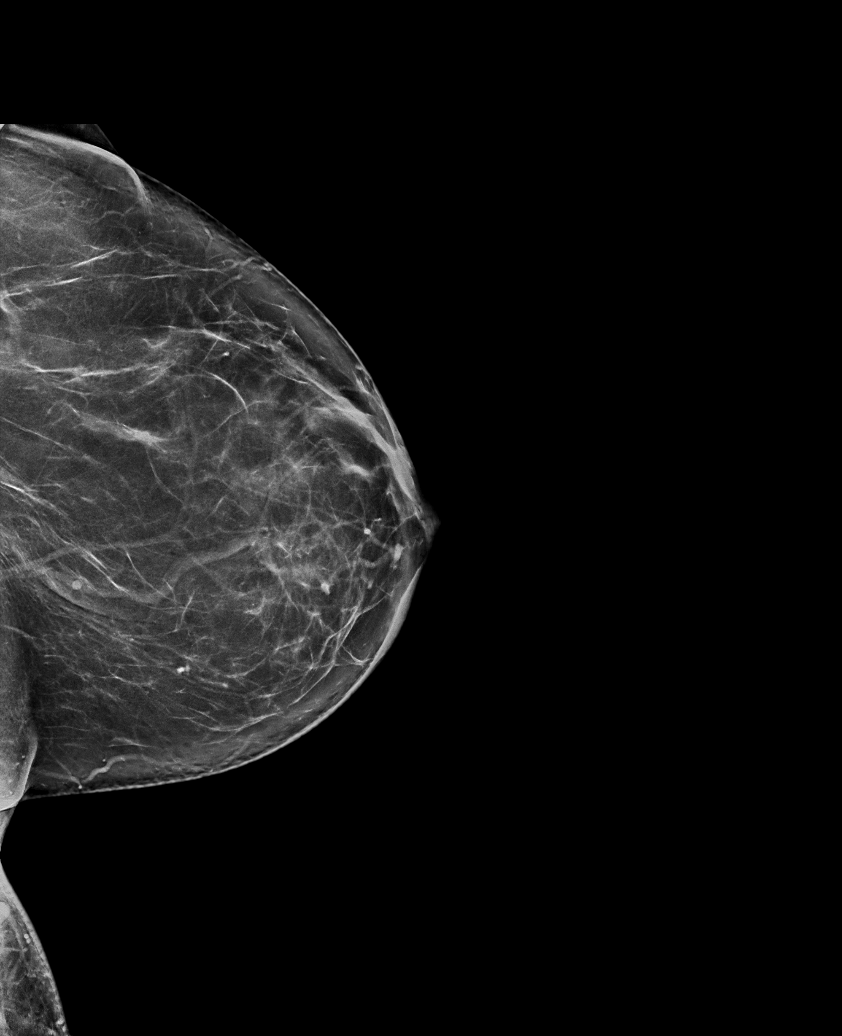

[R CC synth-2D]
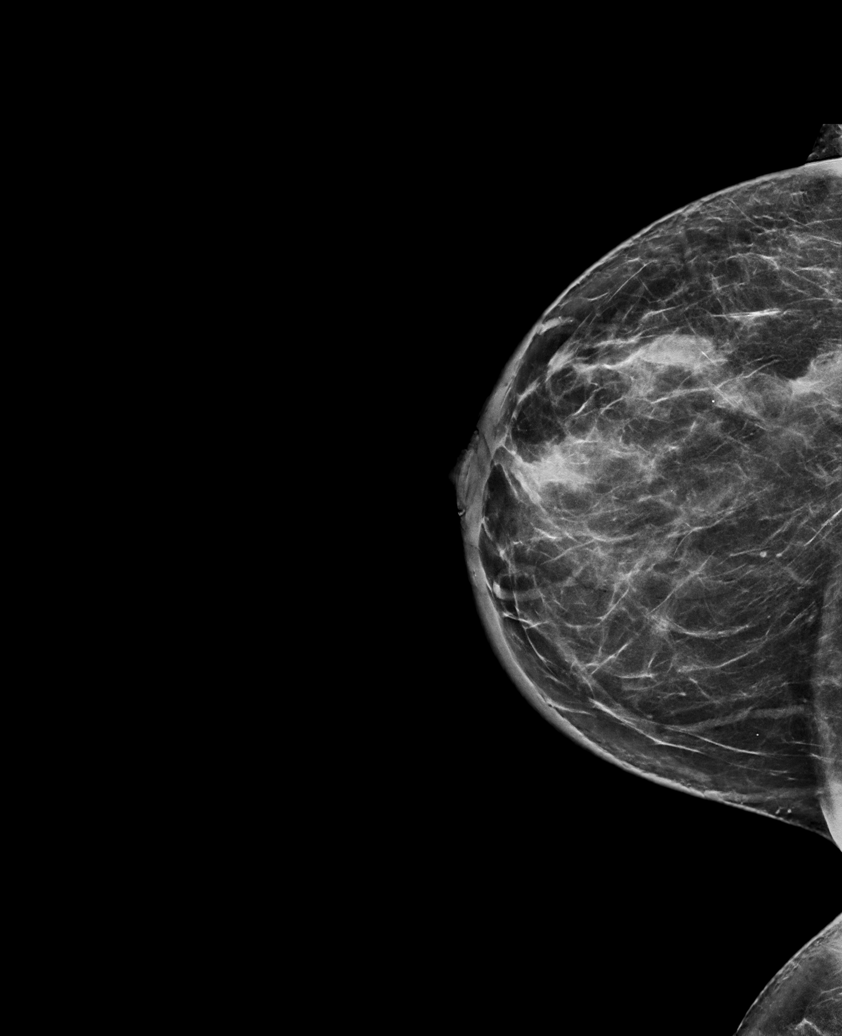

[R MLO synth-2D]
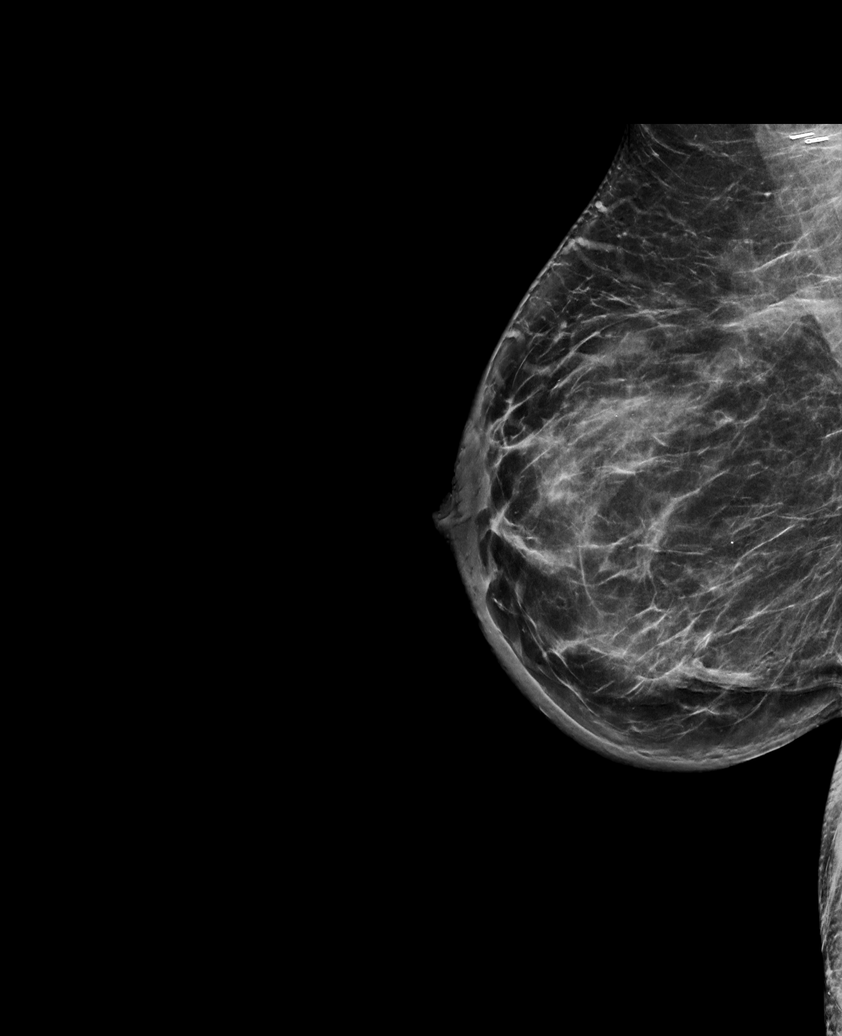

[R CC tomo · tomo slice 41/81.0]
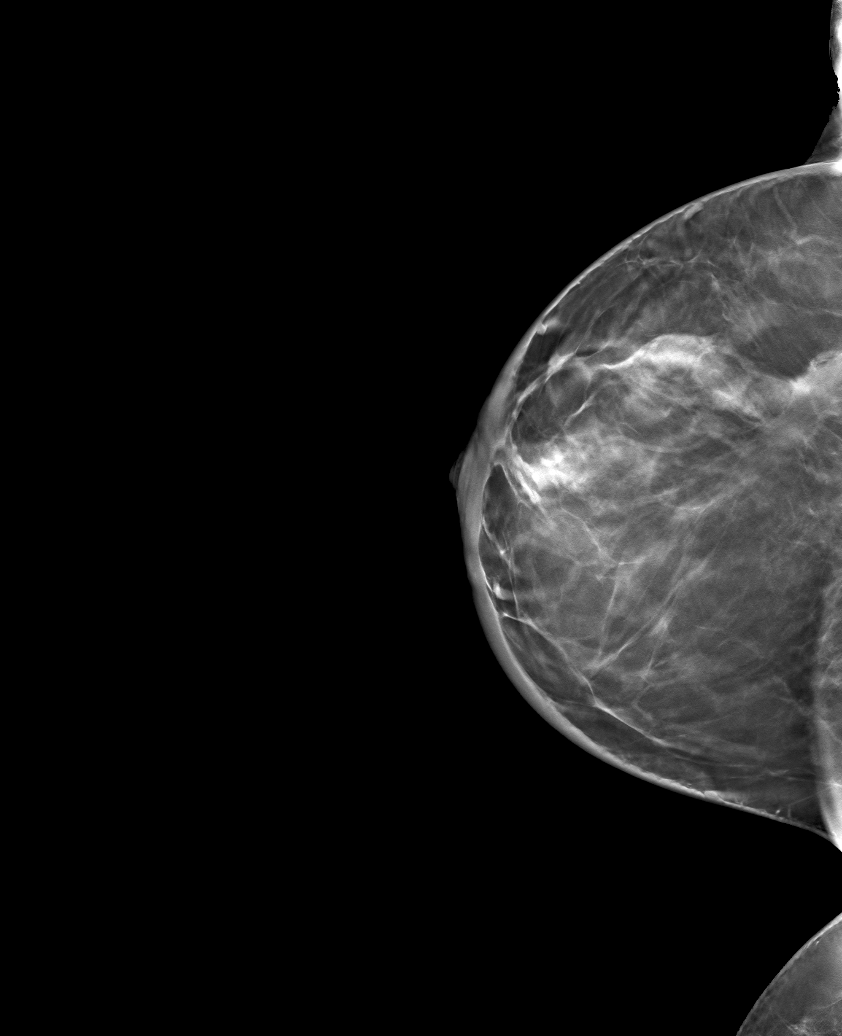

[6 of 25 positions shown; findings below may reference images not displayed]

ACR Breast Density Category c: The breast tissue is heterogeneously
dense, which may obscure small masses.
FINDINGS: Surgical changes noted in the bilateral breast consistent with
history of reduction mammoplasty. Skin thickening of the right
breast is consistent with history of radiation therapy. No evidence
of recurrent disease in the right breast. No suspicious
calcifications, masses or areas of distortion are seen in the
bilateral breasts.

Mammographic images were processed with CAD.
IMPRESSION: Surgical changes consistent with history of right breast lumpectomy
and bilateral reduction mammoplasty. No evidence of malignancy in
the bilateral breasts.

RECOMMENDATION:
Diagnostic mammogram is suggested in 1 year. (Code:[S0])

I have discussed the findings and recommendations with the patient.
If applicable, a reminder letter will be sent to the patient
regarding the next appointment.

BI-RADS CATEGORY  2: Benign.

## 2019-10-17 NOTE — Telephone Encounter (Signed)
Pt called and states she would like to ween off gabapentin, as she needed it during chemo, but does not know how necessary it is now. Pt is asking to be advised on how to ween off this medication.b Please advise.

## 2019-10-18 NOTE — Telephone Encounter (Signed)
Per Dr. Jana Hakim - no need to tape off Gabapentin.  Ok to discontinue.   RN left voicemail for return call regarding recommendations.

## 2019-10-19 ENCOUNTER — Telehealth: Payer: Self-pay

## 2019-10-19 NOTE — Telephone Encounter (Signed)
Pt returned call to [facility] and understands it is OK for her to D/C gabapentin- No need to taper. Pt states she will call back and restart Gabapentin in the event hot flashes return.

## 2019-10-24 ENCOUNTER — Telehealth: Payer: Self-pay

## 2019-10-24 NOTE — Telephone Encounter (Signed)
Pt called stating she needs information on status of her disability as it was missing information the state requested. Pt also voices concerns about needing note to return to work. This LPN will work on getting this note to pt and will get in touch with our FMLA/disability specialist for further assistance. Pt understands this and is in agreement.

## 2019-10-25 NOTE — Progress Notes (Signed)
Per pt request, letter for return to work faxed to Attn: Delma Freeze [phone]. Fax conf. Received.

## 2019-10-26 ENCOUNTER — Ambulatory Visit (INDEPENDENT_AMBULATORY_CARE_PROVIDER_SITE_OTHER): Payer: BC Managed Care – PPO | Admitting: Dermatology

## 2019-10-26 ENCOUNTER — Other Ambulatory Visit: Payer: Self-pay

## 2019-10-26 DIAGNOSIS — L738 Other specified follicular disorders: Secondary | ICD-10-CM | POA: Diagnosis not present

## 2019-10-26 DIAGNOSIS — L920 Granuloma annulare: Secondary | ICD-10-CM

## 2019-10-26 MED ORDER — DAPSONE 7.5 % EX GEL: 1.0000 "application " | Freq: Two times a day (BID) | CUTANEOUS | 2 refills | Status: DC

## 2019-10-26 NOTE — Patient Instructions (Signed)
Granuloma Annulare  

## 2019-10-26 NOTE — Progress Notes (Signed)
   Follow-Up Visit   Subjective  Kaylee Romero is a 39 y.o. female who presents for the following: Other (GA of hands - saw Dr. Laurence Ferrari last year and was given Clobetasol cream but it is not helping).  The following portions of the chart were reviewed this encounter and updated as appropriate:  Tobacco  Allergies  Meds  Problems  Med Hx  Surg Hx  Fam Hx     Review of Systems:  No other skin or systemic complaints except as noted in HPI or Assessment and Plan.  Objective  Well appearing patient in no apparent distress; mood and affect are within normal limits.  A focused examination was performed including chest, hands. Relevant physical exam findings are noted in the Assessment and Plan.  Objective  Bilateral dorsum hands: Annular plaques ranging from 1.0 cm -1.5 cm of bilateral hands.  Images      Objective  Left chest: 0.5 cm yellow papule  Images       Assessment & Plan  Granuloma annulare Bilateral dorsum hands  Discussed treatment options and risk of skin atrophy with long term steroid use.  Discussed treating with Dapsone but recommend starting with topical dapsone before trying oral dapsone.  Start Dapsone 7.5% gel bid.  Dapsone 7.5 % GEL - Bilateral dorsum hands  Sebaceous hyperplasia Left chest Consider biopsy if changes. Benign-appearing.  Observation.  Call clinic for new or changing spots.  Recommend daily use of broad spectrum spf 30+ sunscreen to sun-exposed areas.  Recheck on follow up  Return for Follow up as scheduled, TBSE.  I, Ashok Cordia, CMA, am acting as scribe for Sarina Ser, MD .  Documentation: I have reviewed the above documentation for accuracy and completeness, and I agree with the above.  Sarina Ser, MD

## 2019-10-31 ENCOUNTER — Encounter: Payer: Self-pay | Admitting: Dermatology

## 2019-11-01 ENCOUNTER — Inpatient Hospital Stay: Payer: BC Managed Care – PPO | Attending: Oncology

## 2019-11-01 ENCOUNTER — Inpatient Hospital Stay (HOSPITAL_BASED_OUTPATIENT_CLINIC_OR_DEPARTMENT_OTHER): Payer: BC Managed Care – PPO | Admitting: Adult Health

## 2019-11-01 ENCOUNTER — Other Ambulatory Visit: Payer: Self-pay

## 2019-11-01 VITALS — BP 136/82 | HR 99 | Temp 98.2°F | Resp 18 | Ht 65.0 in | Wt 178.3 lb

## 2019-11-01 DIAGNOSIS — C50411 Malignant neoplasm of upper-outer quadrant of right female breast: Secondary | ICD-10-CM

## 2019-11-01 DIAGNOSIS — Z79810 Long term (current) use of selective estrogen receptor modulators (SERMs): Secondary | ICD-10-CM | POA: Insufficient documentation

## 2019-11-01 DIAGNOSIS — R030 Elevated blood-pressure reading, without diagnosis of hypertension: Secondary | ICD-10-CM | POA: Diagnosis not present

## 2019-11-01 DIAGNOSIS — C50811 Malignant neoplasm of overlapping sites of right female breast: Secondary | ICD-10-CM | POA: Insufficient documentation

## 2019-11-01 DIAGNOSIS — Z170 Estrogen receptor positive status [ER+]: Secondary | ICD-10-CM | POA: Insufficient documentation

## 2019-11-01 LAB — COMPREHENSIVE METABOLIC PANEL
ALT: 24 U/L (ref 0–44)
AST: 19 U/L (ref 15–41)
Albumin: 4.2 g/dL (ref 3.5–5.0)
Alkaline Phosphatase: 64 U/L (ref 38–126)
Anion gap: 7 (ref 5–15)
BUN: 17 mg/dL (ref 6–20)
CO2: 28 mmol/L (ref 22–32)
Calcium: 9.4 mg/dL (ref 8.9–10.3)
Chloride: 102 mmol/L (ref 98–111)
Creatinine, Ser: 0.74 mg/dL (ref 0.44–1.00)
GFR, Estimated: >60 mL/min (ref >60)
Glucose, Bld: 100 mg/dL — ABNORMAL HIGH (ref 70–99)
Potassium: 3.8 mmol/L (ref 3.5–5.1)
Sodium: 137 mmol/L (ref 135–145)
Total Bilirubin: 0.5 mg/dL (ref 0.3–1.2)
Total Protein: 7.6 g/dL (ref 6.5–8.1)

## 2019-11-01 LAB — CBC WITH DIFFERENTIAL/PLATELET
Abs Immature Granulocytes: 0.02 10*3/uL (ref 0.00–0.07)
Basophils Absolute: 0 10*3/uL (ref 0.0–0.1)
Basophils Relative: 0 %
Eosinophils Absolute: 0.1 10*3/uL (ref 0.0–0.5)
Eosinophils Relative: 1 %
HCT: 41.1 % (ref 36.0–46.0)
Hemoglobin: 14.5 g/dL (ref 12.0–15.0)
Immature Granulocytes: 0 %
Lymphocytes Relative: 12 %
Lymphs Abs: 1.1 10*3/uL (ref 0.7–4.0)
MCH: 32.5 pg (ref 26.0–34.0)
MCHC: 35.3 g/dL (ref 30.0–36.0)
MCV: 92.2 fL (ref 80.0–100.0)
Monocytes Absolute: 0.8 10*3/uL (ref 0.1–1.0)
Monocytes Relative: 8 %
Neutro Abs: 7.4 10*3/uL (ref 1.7–7.7)
Neutrophils Relative %: 79 %
Platelets: 263 10*3/uL (ref 150–400)
RBC: 4.46 MIL/uL (ref 3.87–5.11)
RDW: 11.9 % (ref 11.5–15.5)
WBC: 9.4 10*3/uL (ref 4.0–10.5)
nRBC: 0 % (ref 0.0–0.2)

## 2019-11-06 ENCOUNTER — Ambulatory Visit: Payer: BC Managed Care – PPO | Admitting: Oncology

## 2019-11-06 ENCOUNTER — Other Ambulatory Visit: Payer: BC Managed Care – PPO

## 2019-11-08 NOTE — Progress Notes (Signed)
[facility]  [phone] [phone]     ID: [name] DOB: May 10, 1980  MR#: [id number]  ZSW#:[id number]  Patient Care Team: Linda Hedges, DO as PCP - General (Obstetrics and Gynecology) Mauro Kaufmann, RN as Oncology Nurse Navigator Rockwell Germany, RN as Oncology Nurse Navigator Gery Pray, MD as Consulting Physician (Radiation Oncology) Magrinat, Virgie Dad, MD as Consulting Physician (Oncology) Kaylee Kussmaul, MD as Consulting Physician (General Surgery) Dillingham, Loel Lofty, DO as Attending Physician (Plastic Surgery) Noreene Filbert, MD as Radiation Oncologist (Radiation Oncology) Scot Dock, NP OTHER MD:  CHIEF COMPLAINT: estrogen receptor positive breast cancer  CURRENT TREATMENT: Tamoxifen   INTERVAL HISTORY: Kaylee Romero  returns today for follow-up of her estrogen receptor positive breast cancer.   She continues on Tamoxifen daily and is tolerating it moderately well.  She is concerned, because at her recent gyn visit her BP was elevated.  She is tolerating the Tamoxifen moderately well.  She is happy that she is losing some weight.    REVIEW OF SYSTEMS: Kaylee Romero denies any new physical issues, or symptoms with her elevated BP.  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  She is doing well otherwise and a detailed ROS was non contributory.   HISTORY OF CURRENT ILLNESS: From the original intake note:  Kaylee Romero (pronounced "Bram-Hall") found a palpable lump in the 12 o'clock position of the right breast, which he brought to the attention of her gynecologist.. She underwent bilateral diagnostic mammography with tomography and bilateral breast ultrasonography at [facility] on 09/23/2018 showing: breast density category C; a spiculated mass with associated with architectural distortion and pleomorphic calcifications lying in the 11-12 o'clock position,  middle depth, measuring 2 cm in size, corresponding to the palpable abnormality.   Physical exam confirmed a firm but mobile mass in the 12 o'clock position of the right breast.  By ultrasound there was a highly suspicious 2.1 cm mass in the 12 o'clock position of the right breast; no other evidence of breast malignancy or lymphadenopathy bilaterally.Sonographic evaluation of the right axilla found no enlarged or abnormal appearing lymph node  In the left axilla there were several lymph nodes with areas of cortical prominence similar to the lymph node noted on the right.  Given the symmetry all these are presumed to be reactive  Accordingly on 09/26/2018 she proceeded to biopsy of the right breast area in question. The pathology from this procedure ([id number]) showed: invasive ductal carcinoma, grade 2; prognostic indicators significant for: estrogen receptor, 80% positive with moderate staining intensity and progesterone receptor, 90% positive with strong staining intensity. Proliferation marker Ki67 at 35%. HER2 negative by immunohistochemistry (1+).  The patient's subsequent history is as detailed below.   PAST MEDICAL HISTORY: Past Medical History:  Diagnosis Date  . Anxiety   . Family history of breast cancer   . HSV (herpes simplex virus) anogenital infection    positive titer only    PAST SURGICAL HISTORY: Past Surgical History:  Procedure Laterality Date  . BREAST LUMPECTOMY WITH AXILLARY LYMPH NODE BIOPSY Right 11/21/2018   Procedure: RIGHT BREAST LUMPECTOMY WITH SENTINEL LYMPH NODE BIOPSY;  Surgeon: Kaylee Kussmaul, MD;  Location: [facility];  Service: General;  Laterality: Right;  . BREAST REDUCTION SURGERY Bilateral 11/21/2018   Procedure: BILATERAL MAMMARY REDUCTION  (BREAST);  Surgeon: Wallace Going, DO;  Location: [facility];  Service: Plastics;  Laterality: Bilateral;  . PORT-A-CATH REMOVAL N/A 07/06/2019   Procedure: REMOVAL PORT-A-CATH;  Surgeon: Kaylee Kussmaul, MD;  Location:  [facility];  Service: General;  Laterality: N/A;  . PORTACATH PLACEMENT N/A 11/21/2018   Procedure: INSERTION LEFT PORT-A-CATH WITH ULTRASOUND GUIDANCE;  Surgeon: Kaylee Kussmaul, MD;  Location: [facility];  Service: General;  Laterality: N/A;  . TONSILLECTOMY  1987  . TONSILLECTOMY    . WISDOM TOOTH EXTRACTION      FAMILY HISTORY: Family History  Problem Relation Age of Onset  . Hypertension Father   . Alcohol abuse Paternal Grandfather   . Heart disease Paternal Grandfather   . Alcohol abuse Maternal Grandfather   . Heart disease Maternal Grandmother   . Breast cancer Other    Patient's parents are both living at age 33, as of 09/2018. Her father lives in [place], and her mother lives in [place], [place]. The patient denies a family hx of ovarian cancer. A maternal great aunt was diagnosed with breast cancer twice. She has 2 brothers, no sisters   GYNECOLOGIC HISTORY:  No LMP recorded. (Menstrual status: Chemotherapy). Menarche: 39 years old Age at first live birth: 39 years old [place] P 1 Currently having regular periods  Contraceptive: Mirena IUD in place HRT n/a  Hysterectomy? no BSO? no   SOCIAL HISTORY: (updated January 2021) Kaylee Romero is a [employer]. Husband Kaylee Romero is in management with [employer]. [name] is 22 months old.     ADVANCED DIRECTIVES: not in place. Her husband is automatically her HCPOA.   HEALTH MAINTENANCE: Social History   Tobacco Use  . Smoking status: Former Smoker    Packs/day: 0.50    Years: 10.00    Pack years: 5.00    Types: Cigarettes    Start date: 01/13/2002    Quit date: 10/17/2016    Years since quitting: 3.0  . Smokeless tobacco: Never Used  Vaping Use  . Vaping Use: Never used  Substance Use Topics  . Alcohol use: Yes    Comment: Socially  . Drug use: No     Colonoscopy: n/a  PAP: 09/20/2018  Bone density: n/a   Allergies  Allergen Reactions  . Betadine [Povidone Iodine] Rash    Current Outpatient  Medications  Medication Sig Dispense Refill  . amphetamine-dextroamphetamine (ADDERALL XR) 20 MG 24 hr capsule Take 20 mg by mouth daily.    . Ascorbic Acid (VITAMIN C) 1000 MG tablet Take 1,000 mg by mouth daily.    . Dapsone 7.5 % GEL Apply 1 application topically in the morning and at bedtime. 60 g 2  . hydrOXYzine (ATARAX/VISTARIL) 10 MG tablet Take 10 mg by mouth at bedtime.     . Multiple Vitamin (MULTIVITAMIN WITH MINERALS) TABS tablet Take 1 tablet by mouth daily.    . naproxen sodium (ALEVE) 220 MG tablet Take 1 tablet (220 mg total) by mouth 3 (three) times daily with meals. 60 tablet 3  . tamoxifen (NOLVADEX) 20 MG tablet Take 20 mg by mouth daily.    . Tetrahydrozoline HCl (VISINE OP) Place 1 drop into both eyes daily as needed (redness).    . venlafaxine XR (EFFEXOR-XR) 75 MG 24 hr capsule Take 1 capsule (75 mg total) by mouth daily. 90 capsule 3  . vitamin B-12 (CYANOCOBALAMIN) 1000 MCG tablet Take 1,000 mcg by mouth daily.     No current facility-administered medications for this visit.   Facility-Administered Medications Ordered in Other Visits  Medication Dose Route Frequency Provider  Last Rate Last Admin  . sodium chloride flush (NS) 0.9 % injection 10 mL  10 mL Intracatheter PRN Magrinat, Virgie Dad, MD   10 mL at 05/18/19 1553    OBJECTIVE:   Vitals:   11/01/19 1453  BP: 136/82  Pulse: 99  Resp: 18  Temp: 98.2 F (36.8 C)  SpO2: 100%   Wt Readings from Last 3 Encounters:  11/01/19 178 lb 4.8 oz (80.9 kg)  09/18/19 183 lb (83 kg)  08/07/19 182 lb 11.2 oz (82.9 kg)   Body mass index is 29.67 kg/m.    ECOG FS:1 - Symptomatic but completely ambulatory GENERAL: Patient is a well appearing female in no acute distress HEENT:  Sclerae anicteric.  Mask in place. Neck is supple.  NODES:  No cervical, supraclavicular, or axillary lymphadenopathy palpated.  BREAST EXAM:  Right breast s/p lumpectomy and radiation, no sign of local recurrence, left breast benign LUNGS:   Clear to auscultation bilaterally.  No wheezes or rhonchi. HEART:  Regular rate and rhythm. No murmur appreciated. ABDOMEN:  Soft, nontender.  Positive, normoactive bowel sounds. No organomegaly palpated. MSK:  No focal spinal tenderness to palpation. Full range of motion bilaterally in the upper extremities. EXTREMITIES:  No peripheral edema.   SKIN:  Clear with no obvious rashes or skin changes. No nail dyscrasia. NEURO:  Nonfocal. Well oriented.  Appropriate affect.     LAB RESULTS:  CMP     Component Value Date/Time   NA 137 11/01/2019 1428   K 3.8 11/01/2019 1428   CL 102 11/01/2019 1428   CO2 28 11/01/2019 1428   GLUCOSE 100 (H) 11/01/2019 1428   BUN 17 11/01/2019 1428   CREATININE 0.74 11/01/2019 1428   CREATININE 0.59 06/01/2019 1340   CALCIUM 9.4 11/01/2019 1428   PROT 7.6 11/01/2019 1428   ALBUMIN 4.2 11/01/2019 1428   AST 19 11/01/2019 1428   AST 21 06/01/2019 1340   ALT 24 11/01/2019 1428   ALT 26 06/01/2019 1340   ALKPHOS 64 11/01/2019 1428   BILITOT 0.5 11/01/2019 1428   BILITOT 0.5 06/01/2019 1340   GFRNONAA >60 11/01/2019 1428   GFRNONAA >60 06/01/2019 1340   GFRAA >60 08/07/2019 1020   GFRAA >60 06/01/2019 1340    No results found for: TOTALPROTELP, ALBUMINELP, A1GS, A2GS, BETS, BETA2SER, GAMS, MSPIKE, SPEI  No results found for: KPAFRELGTCHN, LAMBDASER, KAPLAMBRATIO  Lab Results  Component Value Date   WBC 9.4 11/01/2019   NEUTROABS 7.4 11/01/2019   HGB 14.5 11/01/2019   HCT 41.1 11/01/2019   MCV 92.2 11/01/2019   PLT 263 11/01/2019    No results found for: LABCA2  No components found for: DPOEUM353  No results for input(s): INR in the last 168 hours.  No results found for: LABCA2  No results found for: IRW431  No results found for: VQM086  No results found for: PYP950  No results found for: CA2729  No components found for: HGQUANT  No results found for: CEA1 / No results found for: CEA1   No results found for:  AFPTUMOR  No results found for: CHROMOGRNA  No results found for: HGBA, HGBA2QUANT, HGBFQUANT, HGBSQUAN (Hemoglobinopathy evaluation)   No results found for: LDH  No results found for: IRON, TIBC, IRONPCTSAT (Iron and TIBC)  No results found for: FERRITIN  Urinalysis    Component Value Date/Time   BILIRUBINUR Neg 01/18/2012 1458   PROTEINUR Neg 01/18/2012 1458   UROBILINOGEN 0.2 01/18/2012 1458   NITRITE Neg 01/18/2012 1458  LEUKOCYTESUR Negative 01/18/2012 1458    STUDIES: MM DIAG BREAST TOMO BILATERAL  Result Date: 10/17/2019 CLINICAL DATA:  39 year old female presenting for routine annual surveillance status post right breast lumpectomy in 2020. She also had a reduction mammoplasty of both breasts at the time of lumpectomy. EXAM: DIGITAL DIAGNOSTIC BILATERAL MAMMOGRAM WITH TOMO AND CAD COMPARISON:  Previous exam(s). ACR Breast Density Category c: The breast tissue is heterogeneously dense, which may obscure small masses. FINDINGS: Surgical changes noted in the bilateral breast consistent with history of reduction mammoplasty. Skin thickening of the right breast is consistent with history of radiation therapy. No evidence of recurrent disease in the right breast. No suspicious calcifications, masses or areas of distortion are seen in the bilateral breasts. Mammographic images were processed with CAD. IMPRESSION: Surgical changes consistent with history of right breast lumpectomy and bilateral reduction mammoplasty. No evidence of malignancy in the bilateral breasts. RECOMMENDATION: Diagnostic mammogram is suggested in 1 year. (Code:[id number]-B-01Y) I have discussed the findings and recommendations with the patient. If applicable, a reminder letter will be sent to the patient regarding the next appointment. BI-RADS CATEGORY  2: Benign. Electronically Signed   By: Ammie Ferrier M.D.   On: 10/17/2019 14:44     ELIGIBLE FOR AVAILABLE RESEARCH PROTOCOL: no  ASSESSMENT: 39 y.o. [place]  woman status post right breast upper inner quadrant biopsy on 09/26/2018 for a clinical T2 N0, stage IB invasive ductal carcinoma, grade 2, estrogen and progesterone receptor positive, HER-2 nonamplified, with an MIB-1-1 of 35%  (1) Genetic testing reported on 10/05/2018 through the [facility] Breast Cancer STAT Panel + Common Hereditary cancer panel found no pathogenic mutations. The STAT Breast cancer panel offered by [facility] includes sequencing and rearrangement analysis for the following 9 genes:  ATM, BRCA1, BRCA2, CDH1, CHEK2, PALB2, PTEN, STK11 and TP53. The Common Hereditary Cancers Panel offered by [facility] includes sequencing and/or deletion duplication testing of the following 47 genes: APC, ATM, AXIN2, BARD1, BMPR1A, BRCA1, BRCA2, BRIP1, CDH1, CDKN2A (p14ARF), CDKN2A (p16INK4a), CKD4, CHEK2, CTNNA1, DICER1, EPCAM (Deletion/duplication testing only), GREM1 (promoter region deletion/duplication testing only), KIT, MEN1, MLH1, MSH2, MSH3, MSH6, MUTYH, NBN, NF1, NHTL1, PALB2, PDGFRA, PMS2, POLD1, POLE, PTEN, RAD50, RAD51C, RAD51D, SDHB, SDHC, SDHD, SMAD4, SMARCA4. STK11, TP53, TSC1, TSC2, and VHL.  The following genes were evaluated for sequence changes only: SDHA and HOXB13 c.251G>A variant only.   (2) MammaPrint obtained from the original breast biopsy returned "high risk" indicating the patient will have an excellent prognosis if she receives adjuvant chemotherapy  (3) status post right lumpectomy and right axillary sentinel lymph node sampling 11/21/2018 for a pT2 pN1, stage IIA invasive ductal carcinoma, grade 2, with negative margins  (a) a total of 4 sentinel lymph nodes removed, 1 with macrometastasis  (b) status post bilateral breast reductions at the time of lumpectomy  (4) adjuvant chemotherapy consisting of cyclophosphamide and doxorubicin in dose dense fashion x4 started 01/05/2019, completed 02/15/2019, followed by weekly paclitaxel x12 started 03/02/2019  (a) echo 10/27/2018 shows an  ejection fraction in the 55-60% range  (5) adjuvant radiation to be completed 08/11/2019 (Kaylee Romero)  (6) tamoxifen started 10/05/2018 in anticipation of surgical delays  (a) discontinued 12/05/2018 during surgery and chemotherapy  (b) to be resumed 09/06/2019  (c) Mirena IUD in place     PLAN: Kaylee Romero is doing well today.  She has no sign of breast cancer recurrence.  She is losing some of the weight that she gained with chemotherapy and is going back to work next week.  She will continue on Tamoxifen daily as she is tolerating this moderately well.  She is having some episodes of elevated BP.  Today, her BP is slightly elevated at 136, but nothing overly concerning.  I recommended that she purchase a blood pressure cuff at the drug store that will go around her upper arm and monitor this once a day, and record the numbers.  If persistently elevated, I would recommend f/u with her PCP.    Kaylee Romero will return for a survivorship care plan visit around christmas break.  She can call in the interim for any questions or concerns that may arise between now and then.    Total encounter time today 30 minutes.Wilber Bihari, NP 11/08/19 2:15 PM Medical Oncology and Hematology [facility] [place], [place] [address] Tel. [phone]    Fax. [phone]   *Total Encounter Time as defined by the Centers for Medicare and Medicaid Services includes, in addition to the face-to-face time of a patient visit (documented in the note above) non-face-to-face time: obtaining and reviewing outside history, ordering and reviewing medications, tests or procedures, care coordination (communications with other health care professionals or caregivers) and documentation in the medical record.

## 2019-11-22 ENCOUNTER — Encounter: Payer: BC Managed Care – PPO | Admitting: Adult Health

## 2019-11-22 ENCOUNTER — Other Ambulatory Visit: Payer: BC Managed Care – PPO

## 2019-12-06 ENCOUNTER — Encounter: Payer: Self-pay | Admitting: Adult Health

## 2019-12-06 ENCOUNTER — Telehealth: Payer: Self-pay

## 2019-12-06 ENCOUNTER — Inpatient Hospital Stay: Payer: BC Managed Care – PPO | Attending: Oncology | Admitting: Adult Health

## 2019-12-06 ENCOUNTER — Other Ambulatory Visit: Payer: Self-pay

## 2019-12-06 DIAGNOSIS — C50411 Malignant neoplasm of upper-outer quadrant of right female breast: Secondary | ICD-10-CM

## 2019-12-06 DIAGNOSIS — Z170 Estrogen receptor positive status [ER+]: Secondary | ICD-10-CM | POA: Diagnosis not present

## 2019-12-06 NOTE — Telephone Encounter (Signed)
Attempted to call pt in efforts to complete survivorship survey via telephone for virtual visit today. Pt did not answer. LVM for pt to return my call.

## 2019-12-06 NOTE — Progress Notes (Signed)
SURVIVORSHIP VIRTUAL VISIT:  I connected with Kaylee Romero on 12/06/19 at  4:00 PM EST by telephone and verified that I am speaking with the correct person using two identifiers.  I discussed the limitations, risks, security and privacy concerns of performing an evaluation and management service by telephone and the availability of in person appointments. I also discussed with the patient that there may be a patient responsible charge related to this service. The patient expressed understanding and agreed to proceed.   BRIEF ONCOLOGIC HISTORY:  Oncology History  Malignant neoplasm of upper-outer quadrant of right breast in female, estrogen receptor positive ([place])  09/29/2018 Initial Diagnosis   Malignant neoplasm of upper-outer quadrant of right breast in female, estrogen receptor positive ([place])   09/2018 -  Anti-estrogen oral therapy   Tamoxifen; held from 12/05/2018-09/06/2019 for surgery   10/13/2018 Genetic Testing   Negative genetic testing. No pathogenic variants identified on the [facility] Breast Cancer STAT Panel + Common Hereditary Cancers Panel. The Common Hereditary Cancers Panel offered by [facility] includes sequencing and/or deletion duplication testing of the following 47 genes: APC, ATM, AXIN2, BARD1, BMPR1A, BRCA1, BRCA2, BRIP1, CDH1, CDKN2A (p14ARF), CDKN2A (p16INK4a), CKD4, CHEK2, CTNNA1, DICER1, EPCAM (Deletion/duplication testing only), GREM1 (promoter region deletion/duplication testing only), KIT, MEN1, MLH1, MSH2, MSH3, MSH6, MUTYH, NBN, NF1, NHTL1, PALB2, PDGFRA, PMS2, POLD1, POLE, PTEN, RAD50, RAD51C, RAD51D, SDHB, SDHC, SDHD, SMAD4, SMARCA4. STK11, TP53, TSC1, TSC2, and VHL.  The following genes were evaluated for sequence changes only: SDHA and HOXB13 c.251G>A variant only. The report date is 10/13/2018.    11/21/2018 Surgery   Right lumpectomy [name]) [phone]): IDC, grade 2, 2.6 cm, with DCIS. Negative margins. 1/4 lymph nodes positive for macrometastasis. ER/PR positive,  HER-2 negative.   12/26/2018 - 06/01/2019 Chemotherapy   dexamethasone (DECADRON) 4 MG tablet, 1 of 1 cycle, Start date: 12/05/2018, End date: 03/02/2019  DOXOrubicin (ADRIAMYCIN) chemo injection 114 mg, 60 mg/m2 = 114 mg, Intravenous,  Once, 4 of 4 cycles. Administration: 114 mg (01/05/2019), 114 mg (01/19/2019), 114 mg (02/02/2019), 114 mg (02/15/2019)  palonosetron (ALOXI) injection 0.25 mg, 0.25 mg, Intravenous,  Once, 1 of 1 cycle. Administration: 0.25 mg (01/05/2019)  pegfilgrastim (NEULASTA ONPRO KIT) injection 6 mg, 6 mg, Subcutaneous, Once, 2 of 2 cycles  pegfilgrastim-jmdb (FULPHILA) injection 6 mg, 6 mg, Subcutaneous,  Once, 4 of 4 cycles. Administration: 6 mg (01/07/2019), 6 mg (01/21/2019), 6 mg (02/06/2019), 6 mg (02/17/2019)  cyclophosphamide (CYTOXAN) 1,140 mg in sodium chloride 0.9 % 250 mL chemo infusion, 600 mg/m2 = 1,140 mg, Intravenous,  Once, 4 of 4 cycles. Administration: 1,140 mg (01/05/2019), 1,140 mg (01/19/2019), 1,140 mg (02/02/2019), 1,140 mg (02/15/2019).  PACLitaxel (TAXOL) 150 mg in sodium chloride 0.9 % 250 mL chemo infusion (</= $RemoveBefor'80mg'lKgjmJdHnHcd$ /m2), 80 mg/m2 = 150 mg, Intravenous,  Once, 12 of 12 cycles. Administration: 150 mg (03/02/2019), 150 mg (03/09/2019), 150 mg (03/23/2019), 150 mg (03/30/2019), 150 mg (04/06/2019), 150 mg (04/20/2019), 150 mg (04/27/2019), 150 mg (05/03/2019), 150 mg (05/11/2019), 150 mg (05/18/2019), 150 mg (05/25/2019), 150 mg (06/01/2019)  fosaprepitant (EMEND) 150 mg  dexamethasone (DECADRON) 12 mg in sodium chloride 0.9 % 145 mL IVPB, , Intravenous,  Once, 4 of 4 cycles. Administration:  (01/05/2019),  (01/19/2019),  (02/02/2019),  (02/15/2019).   06/21/2019 - 08/11/2019 Radiation Therapy   The patient initially received a dose of 50.4 Gy in 28 fractions to the breast using whole-breast tangent fields. This was delivered using a 3-D conformal technique. The pt received a boost delivering an additional 16 Gy in  8 fractions using a electron boost with 19meV electrons. The total  dose was 66.4 Gy.    11/12/2019 Cancer Staging   Staging form: Breast, AJCC 8th Edition - Pathologic: Stage IB (pT2, pN1a, cM0, G2, ER+, PR+, HER2-)     Oncotype testing   MammaPrint high risk     INTERVAL HISTORY:  Kaylee Romero to review her survivorship care plan detailing her treatment course for breast cancer, as well as monitoring long-term side effects of that treatment, education regarding health maintenance, screening, and overall wellness and health promotion.     Overall, Kaylee Romero reports feeling quite well.  She has been back at work for about one month.  She was initially very tired however that has been improving.  Her fatigue is now 5/10.  She has continued tightness in her right arm and shoulder.    REVIEW OF SYSTEMS:  Review of Systems  Constitutional: Positive for fatigue. Negative for appetite change, chills, fever and unexpected weight change.  HENT:   Negative for hearing loss, lump/mass and trouble swallowing.   Eyes: Negative for eye problems and icterus.  Respiratory: Negative for chest tightness, cough and shortness of breath.   Cardiovascular: Negative for chest pain, leg swelling and palpitations.  Gastrointestinal: Negative for abdominal distention, abdominal pain, constipation, diarrhea, nausea and vomiting.  Endocrine: Negative for hot flashes.  Genitourinary: Negative for difficulty urinating.   Musculoskeletal: Negative for arthralgias.  Skin: Negative for itching and rash.  Neurological: Negative for dizziness, extremity weakness, headaches and numbness.  Hematological: Negative for adenopathy. Does not bruise/bleed easily.  Psychiatric/Behavioral: Negative for depression. The patient is not nervous/anxious.   Breast: Denies any new nodularity, masses, tenderness, nipple changes, or nipple discharge.      ONCOLOGY TREATMENT TEAM:  1. Surgeon:  Dr. [name] at [facility] 2. Medical Oncologist: Dr. Jana Hakim  3. Radiation Oncologist:  Dr. Baruch Gouty    PAST MEDICAL/SURGICAL HISTORY:  Past Medical History:  Diagnosis Date  . Anxiety   . Family history of breast cancer   . HSV (herpes simplex virus) anogenital infection    positive titer only   Past Surgical History:  Procedure Laterality Date  . BREAST LUMPECTOMY WITH AXILLARY LYMPH NODE BIOPSY Right 11/21/2018   Procedure: RIGHT BREAST LUMPECTOMY WITH SENTINEL LYMPH NODE BIOPSY;  Surgeon: Jovita Kussmaul, MD;  Location: [facility];  Service: General;  Laterality: Right;  . BREAST REDUCTION SURGERY Bilateral 11/21/2018   Procedure: BILATERAL MAMMARY REDUCTION  (BREAST);  Surgeon: Wallace Going, DO;  Location: [place];  Service: Plastics;  Laterality: Bilateral;  . PORT-A-CATH REMOVAL N/A 07/06/2019   Procedure: REMOVAL PORT-A-CATH;  Surgeon: Jovita Kussmaul, MD;  Location: [facility];  Service: General;  Laterality: N/A;  . PORTACATH PLACEMENT N/A 11/21/2018   Procedure: INSERTION LEFT PORT-A-CATH WITH ULTRASOUND GUIDANCE;  Surgeon: Jovita Kussmaul, MD;  Location: [facility];  Service: General;  Laterality: N/A;  . TONSILLECTOMY  1987  . TONSILLECTOMY    . WISDOM TOOTH EXTRACTION       ALLERGIES:  Allergies  Allergen Reactions  . Betadine [Povidone Iodine] Rash     CURRENT MEDICATIONS:  Outpatient Encounter Medications as of 12/06/2019  Medication Sig  . amphetamine-dextroamphetamine (ADDERALL XR) 20 MG 24 hr capsule Take 20 mg by mouth daily.  . Ascorbic Acid (VITAMIN C) 1000 MG tablet Take 1,000 mg by mouth daily.  . Dapsone 7.5 % GEL Apply 1 application topically in the morning and at bedtime.  . hydrOXYzine (  ATARAX/VISTARIL) 10 MG tablet Take 10 mg by mouth at bedtime.   . Multiple Vitamin (MULTIVITAMIN WITH MINERALS) TABS tablet Take 1 tablet by mouth daily.  . naproxen sodium (ALEVE) 220 MG tablet Take 1 tablet (220 mg total) by mouth 3 (three) times daily with meals.  . tamoxifen (NOLVADEX) 20 MG tablet Take 20 mg by mouth daily.  .  Tetrahydrozoline HCl (VISINE OP) Place 1 drop into both eyes daily as needed (redness).  . venlafaxine XR (EFFEXOR-XR) 75 MG 24 hr capsule Take 1 capsule (75 mg total) by mouth daily.  . vitamin B-12 (CYANOCOBALAMIN) 1000 MCG tablet Take 1,000 mcg by mouth daily.   Facility-Administered Encounter Medications as of 12/06/2019  Medication  . sodium chloride flush (NS) 0.9 % injection 10 mL     ONCOLOGIC FAMILY HISTORY:  Family History  Problem Relation Age of Onset  . Hypertension Father   . Alcohol abuse Paternal Grandfather   . Heart disease Paternal Grandfather   . Alcohol abuse Maternal Grandfather   . Heart disease Maternal Grandmother   . Breast cancer Other      GENETIC COUNSELING/TESTING: See above  SOCIAL HISTORY:  Social History   Socioeconomic History  . Marital status: Married    Spouse name: Not on file  . Number of children: Not on file  . Years of education: Not on file  . Highest education level: Not on file  Occupational History  . Not on file  Tobacco Use  . Smoking status: Former Smoker    Packs/day: 0.50    Years: 10.00    Pack years: 5.00    Types: Cigarettes    Start date: 01/13/2002    Quit date: 10/17/2016    Years since quitting: 3.1  . Smokeless tobacco: Never Used  Vaping Use  . Vaping Use: Never used  Substance and Sexual Activity  . Alcohol use: Yes    Comment: Socially  . Drug use: No  . Sexual activity: Not on file  Other Topics Concern  . Not on file  Social History Narrative  . Not on file   Social Determinants of Health   Financial Resource Strain:   . Difficulty of Paying Living Expenses: Not on file  Food Insecurity:   . Worried About Charity fundraiser in the Last Year: Not on file  . Ran Out of Food in the Last Year: Not on file  Transportation Needs:   . Lack of Transportation (Medical): Not on file  . Lack of Transportation (Non-Medical): Not on file  Physical Activity:   . Days of Exercise per Week: Not on file   . Minutes of Exercise per Session: Not on file  Stress:   . Feeling of Stress : Not on file  Social Connections:   . Frequency of Communication with Friends and Family: Not on file  . Frequency of Social Gatherings with Friends and Family: Not on file  . Attends Religious Services: Not on file  . Active Member of Clubs or Organizations: Not on file  . Attends Archivist Meetings: Not on file  . Marital Status: Not on file  Intimate Partner Violence:   . Fear of Current or Ex-Partner: Not on file  . Emotionally Abused: Not on file  . Physically Abused: Not on file  . Sexually Abused: Not on file     OBSERVATIONS/OBJECTIVE:  Patient sounds well. She is in no apparent distress.  Mood and behavior are normal.  Breathing is non labored.  LABORATORY DATA:  None for this visit.  DIAGNOSTIC IMAGING:  None for this visit.      ASSESSMENT AND PLAN:  Ms.. Romero is a pleasant 39 y.o. female with Stage IB left breast invasive ductal carcinoma, ER+/PR+/HER2-, diagnosed in 09/2018, treated with lumpectomy, adjuvant radiation therapy, and anti-estrogen therapy with Tamoxifen beginning in 06/2019.  She presents to the Survivorship Clinic for our initial meeting and routine follow-up post-completion of treatment for breast cancer.    1. Stage IB right/left breast cancer:  Kaylee Romero is continuing to recover from definitive treatment for breast cancer. She will follow-up with her medical oncologist, Dr. Jana Hakim in 04/2020 with history and physical exam per surveillance protocol.  She will continue her anti-estrogen therapy with Tamoxifen. Thus far, she is tolerating the Tamoxifen well, with minimal side effects.. Her mammogram is due 10/2019; orders placed today.   Today, a comprehensive survivorship care plan and treatment summary was reviewed with the patient today detailing her breast cancer diagnosis, treatment course, potential late/long-term effects of treatment, appropriate  follow-up care with recommendations for the future, and patient education resources.  A copy of this summary, along with a letter will be sent to the patient's primary care provider via mail/fax/In Basket message after today's visit.    2. Bone health:  She was given education on specific activities to promote bone health.  3. Cancer screening:  Due to Kaylee Romero's history and her age, she should receive screening for skin cancers, colon cancer, and gynecologic cancers.  The information and recommendations are listed on the patient's comprehensive care plan/treatment summary and were reviewed in detail with the patient.    4. Health maintenance and wellness promotion: Kaylee Romero was encouraged to consume 5-7 servings of fruits and vegetables per day. We reviewed the "Nutrition Rainbow" handout, as well as the handout "Take Control of Your Health and Reduce Your Cancer Risk" from the [employer].  She was also encouraged to engage in moderate to vigorous exercise for 30 minutes per day most days of the week. We discussed the [employer] fitness program, which is designed for cancer survivors to help them become more physically fit after cancer treatments.  She was instructed to limit her alcohol consumption and continue to abstain from tobacco use.     5. Support services/counseling: It is not uncommon for this period of the patient's cancer care trajectory to be one of many emotions and stressors.  We discussed how this can be increasingly difficult during the times of quarantine and social distancing due to the COVID-19 pandemic.   She was given information regarding our available services and encouraged to contact me with any questions or for help enrolling in any of our support group/programs.    Follow up instructions:    -Return to cancer center in 04/2020  -Mammogram due in 10/2020 -She is welcome to return back to the Survivorship Clinic at any time; no additional follow-up  needed at this time.  -Consider referral back to survivorship as a long-term survivor for continued surveillance  The patient was provided an opportunity to ask questions and all were answered. The patient agreed with the plan and demonstrated an understanding of the instructions.   Total encounter time: 30 minutes of telephone visit time.    Wilber Bihari, NP 12/06/19 4:14 PM Medical Oncology and Hematology [facility] [place], [place] [address] Tel. [phone]    Fax. [phone]  *Total Encounter Time as defined by the  Centers for Medicare and Medicaid Services includes, in addition to the face-to-face time of a patient visit (documented in the note above) non-face-to-face time: obtaining and reviewing outside history, ordering and reviewing medications, tests or procedures, care coordination (communications with other health care professionals or caregivers) and documentation in the medical record.

## 2019-12-06 NOTE — Telephone Encounter (Signed)
This LPN called pt to complete survivorship survey before telehealth visit with Wilber Bihari, NP. Pt was agreeable to survey and this was complete.

## 2019-12-06 NOTE — Patient Instructions (Signed)

## 2019-12-08 ENCOUNTER — Telehealth: Payer: Self-pay | Admitting: Adult Health

## 2019-12-08 NOTE — Telephone Encounter (Signed)
No 12/1 los, no changes made to pt schedule

## 2019-12-11 ENCOUNTER — Telehealth: Payer: Self-pay | Admitting: *Deleted

## 2019-12-11 NOTE — Telephone Encounter (Signed)
Mailed survivorship plan to pt per Thedore Mins NP.

## 2020-01-04 ENCOUNTER — Ambulatory Visit: Payer: BC Managed Care – PPO | Admitting: Dermatology

## 2020-01-09 ENCOUNTER — Ambulatory Visit: Payer: BC Managed Care – PPO | Admitting: Dermatology

## 2020-02-19 ENCOUNTER — Ambulatory Visit: Payer: BC Managed Care – PPO | Admitting: Radiation Oncology

## 2020-02-20 ENCOUNTER — Ambulatory Visit: Payer: BC Managed Care – PPO | Admitting: Dermatology

## 2020-02-20 ENCOUNTER — Encounter: Payer: Self-pay | Admitting: Dermatology

## 2020-02-20 ENCOUNTER — Other Ambulatory Visit: Payer: Self-pay

## 2020-02-20 DIAGNOSIS — D229 Melanocytic nevi, unspecified: Secondary | ICD-10-CM

## 2020-02-20 DIAGNOSIS — L910 Hypertrophic scar: Secondary | ICD-10-CM

## 2020-02-20 DIAGNOSIS — S61205A Unspecified open wound of left ring finger without damage to nail, initial encounter: Secondary | ICD-10-CM

## 2020-02-20 DIAGNOSIS — L578 Other skin changes due to chronic exposure to nonionizing radiation: Secondary | ICD-10-CM

## 2020-02-20 DIAGNOSIS — L814 Other melanin hyperpigmentation: Secondary | ICD-10-CM

## 2020-02-20 DIAGNOSIS — L920 Granuloma annulare: Secondary | ICD-10-CM | POA: Diagnosis not present

## 2020-02-20 DIAGNOSIS — T148XXA Other injury of unspecified body region, initial encounter: Secondary | ICD-10-CM

## 2020-02-20 DIAGNOSIS — L821 Other seborrheic keratosis: Secondary | ICD-10-CM

## 2020-02-20 DIAGNOSIS — Z1283 Encounter for screening for malignant neoplasm of skin: Secondary | ICD-10-CM

## 2020-02-20 MED ORDER — MUPIROCIN 2 % EX OINT: 1.0000 "application " | TOPICAL_OINTMENT | Freq: Every day | CUTANEOUS | 1 refills | Status: DC

## 2020-02-20 NOTE — Progress Notes (Signed)
Follow-Up Visit   Subjective  Kaylee Romero is a 40 y.o. female who presents for the following: Annual Exam. The patient presents for Total-Body Skin Exam (TBSE) for skin cancer screening and mole check.  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 The areas on her hands are bothersome. She also notes a bump on her chest the last few months and has a thick scar at her previous port site.  She also notes a new wound on her left hand for a few days.  The following portions of the chart were reviewed this encounter and updated as appropriate:   Tobacco  Allergies  Meds  Problems  Med Hx  Surg Hx  Fam Hx      Review of Systems:  No other skin or systemic complaints except as noted in HPI or Assessment and Plan.  Objective  Well appearing patient in no apparent distress; mood and affect are within normal limits.  A full examination was performed including scalp, head, eyes, ears, nose, lips, neck, chest, axillae, abdomen, back, buttocks, bilateral upper extremities, bilateral lower extremities, hands, feet, fingers, toes, fingernails, and toenails. All findings within normal limits unless otherwise noted below.  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 Informed consent was obtained. Preparation: The area was prepared a standard fashion.  Procedure Details: An intralesional injection was performed with Kenalog 2 mg/cc. 0.8 cc in total were injected.  Total number of injections: 11  Plan: The patient was instructed on post-op care. Recommend OTC analgesia as needed for pain.   Other Related Medications Dapsone 7.5 % GEL  Hypertrophic scar Left lateral chest x 2  Scar at port site Favor acne scar at medial chest  Intralesional injection - Left lateral chest x 2 Location: left lateral chest, left medial chest  Informed Consent: Discussed risks (infection, pain, bleeding, bruising, thinning of the skin, loss of skin pigment, lack of resolution, and recurrence of lesion) and benefits of the procedure, as well as the alternatives. Informed consent was obtained. Preparation: The area was prepared a standard fashion.  Procedure Details: An intralesional injection was performed with Kenalog 5 mg/cc. 0.2 cc in total were injected.  Total number of injections: 4  Plan: The patient was instructed on post-op care. Recommend OTC analgesia as needed for pain.   Open wound Left 4th finger  Start mupirocin three times per day  Ordered Medications: mupirocin ointment (BACTROBAN) 2 %   Lentigines - Scattered tan macules - Discussed due to sun exposure - Benign, observe - Call for any changes  Seborrheic Keratoses - Stuck-on, waxy, tan-brown papules and plaques  - Discussed benign etiology and prognosis. - Observe - Call for any changes  Melanocytic Nevi - Tan-brown and/or pink-flesh-colored symmetric macules and papules - Benign appearing on exam today -  Observation - Call clinic for new or changing moles - Recommend daily use of broad spectrum spf 30+ sunscreen to sun-exposed areas.   Actinic Damage - Chronic, secondary to cumulative UV/sun exposure - diffuse  scaly erythematous macules with underlying dyspigmentation - Recommend daily broad spectrum sunscreen SPF 30+ to sun-exposed areas, reapply every 2 hours as needed.  - Call for new or changing lesions.  Skin cancer screening performed today.  Return in about 6 weeks (around 04/02/2020) for GA and hypertrophic scar follow up.  I, Marye Round, CMA, am acting as scribe for Forest Gleason, MD .  Documentation: I have reviewed the above documentation for accuracy and completeness, and I agree with the above.  Forest Gleason, MD

## 2020-02-20 NOTE — Patient Instructions (Signed)
Recommend daily broad spectrum sunscreen SPF 30+ to sun-exposed areas, reapply every 2 hours as needed. Call for new or changing lesions.  Melanoma ABCDEs  Melanoma is the most dangerous type of skin cancer, and is the leading cause of death from skin disease.  You are more likely to develop melanoma if you:  Have light-colored skin, light-colored eyes, or red or blond hair  Spend a lot of time in the sun  Tan regularly, either outdoors or in a tanning bed  Have had blistering sunburns, especially during childhood  Have a close family member who has had a melanoma  Have atypical moles or large birthmarks  Early detection of melanoma is key since treatment is typically straightforward and cure rates are extremely high if we catch it early.   The first sign of melanoma is often a change in a mole or a new dark spot.  The ABCDE system is a way of remembering the signs of melanoma.  A for asymmetry:  The two halves do not match. B for border:  The edges of the growth are irregular. C for color:  A mixture of colors are present instead of an even brown color. D for diameter:  Melanomas are usually (but not always) greater than 64mm - the size of a pencil eraser. E for evolution:  The spot keeps changing in size, shape, and color.  Please check your skin once per month between visits. You can use a small mirror in front and a large mirror behind you to keep an eye on the back side or your body.   If you see any new or changing lesions before your next follow-up, please call to schedule a visit.  Please continue daily skin protection including broad spectrum sunscreen SPF 30+ to sun-exposed areas, reapplying every 2 hours as needed when you're outdoors.     Recommend taking Heliocare sun protection supplement daily in sunny weather for additional sun protection. For maximum protection on the sunniest days, you can take up to 2 capsules of regular Heliocare OR take 1 capsule of Heliocare  Ultra. For prolonged exposure (such as a full day in the sun), you can repeat your dose of the supplement 4 hours after your first dose. Heliocare can be purchased at [facility] or at [email or web address].

## 2020-02-27 ENCOUNTER — Other Ambulatory Visit: Payer: Self-pay

## 2020-02-27 ENCOUNTER — Encounter: Payer: Self-pay | Admitting: Plastic Surgery

## 2020-02-27 ENCOUNTER — Ambulatory Visit (INDEPENDENT_AMBULATORY_CARE_PROVIDER_SITE_OTHER): Payer: BC Managed Care – PPO | Admitting: Plastic Surgery

## 2020-02-27 VITALS — BP 141/94 | HR 99 | Wt 168.0 lb

## 2020-02-27 DIAGNOSIS — Z170 Estrogen receptor positive status [ER+]: Secondary | ICD-10-CM | POA: Diagnosis not present

## 2020-02-27 DIAGNOSIS — C50411 Malignant neoplasm of upper-outer quadrant of right female breast: Secondary | ICD-10-CM | POA: Diagnosis not present

## 2020-02-27 DIAGNOSIS — Z9889 Other specified postprocedural states: Secondary | ICD-10-CM | POA: Diagnosis not present

## 2020-02-27 NOTE — Progress Notes (Signed)
   Subjective:    Patient ID: Kaylee Romero, female    DOB: September 30, 1980, 40 y.o.   MRN: [id number]  The patient is a 40 year old female here for follow-up on her breast surgery.  She underwent oncologic breast reductions for a right sided breast cancer.  She had radiation to the right breast.  She is about 8 months from her radiation.  Overall she is doing really well.  She notices a little bit of asymmetry with the left breast being a little bit lower than the right.  As we reviewed pictures we noted that the breast has a little different shape from preop and also a little asymmetry in her shoulders.  No signs of anything concerning and her scars have all healed really nicely.     Review of Systems  Constitutional: Negative.   HENT: Negative.   Eyes: Negative.   Respiratory: Negative.   Cardiovascular: Negative.   Gastrointestinal: Negative.   Endocrine: Negative.   Genitourinary: Negative.        Objective:   Physical Exam Vitals and nursing note reviewed.  Constitutional:      Appearance: Normal appearance.  HENT:     Head: Normocephalic and atraumatic.  Cardiovascular:     Rate and Rhythm: Normal rate.     Pulses: Normal pulses.  Pulmonary:     Effort: Pulmonary effort is normal.  Skin:    General: Skin is warm.     Capillary Refill: Capillary refill takes less than 2 seconds.     Findings: No bruising.  Neurological:     General: No focal deficit present.     Mental Status: She is alert and oriented to person, place, and time.     Cranial Nerves: No cranial nerve deficit.     Motor: No weakness.  Psychiatric:        Mood and Affect: Mood normal.        Behavior: Behavior normal.        Thought Content: Thought content normal.           Assessment & Plan:     ICD-10-CM   1. Hx of breast surgery  Z98.890   2. Malignant neoplasm of upper-outer quadrant of right breast in female, estrogen receptor positive ([place])  C50.411    Z17.0    We discussed different  options for revision.  We still do not want to do anything to the right breast that was radiated.  The options are for a lift on the left breast.  If she wants more fullness in the superior aspect of the typical way to do that as with an implant.  That is not quite what I suggest with her history but it is an option.  She seems to agree she would like to avoid the implant route if she can.  I would like to give the right breast a little bit more time to see if it settles anymore after the radiation.  She is in agreement.  I would like to see her back in about 3 months for further evaluation and discussion.  Patient is in agreement. Pictures were obtained of the patient and placed in the chart with the patient's or guardian's permission.

## 2020-02-29 ENCOUNTER — Telehealth: Payer: Self-pay | Admitting: Licensed Clinical Social Worker

## 2020-02-29 ENCOUNTER — Encounter: Payer: Self-pay | Admitting: Radiation Oncology

## 2020-02-29 ENCOUNTER — Other Ambulatory Visit: Payer: Self-pay | Admitting: Licensed Clinical Social Worker

## 2020-02-29 ENCOUNTER — Ambulatory Visit
Admission: RE | Admit: 2020-02-29 | Discharge: 2020-02-29 | Disposition: A | Discharge status: Home / Self Care | Payer: BC Managed Care – PPO | Source: Ambulatory Visit | Attending: Radiation Oncology | Admitting: Radiation Oncology

## 2020-02-29 VITALS — BP 130/90 | HR 111 | Temp 97.4°F | Resp 16 | Wt 168.9 lb

## 2020-02-29 DIAGNOSIS — Z170 Estrogen receptor positive status [ER+]: Secondary | ICD-10-CM | POA: Diagnosis not present

## 2020-02-29 DIAGNOSIS — Z79810 Long term (current) use of selective estrogen receptor modulators (SERMs): Secondary | ICD-10-CM | POA: Diagnosis not present

## 2020-02-29 DIAGNOSIS — C50411 Malignant neoplasm of upper-outer quadrant of right female breast: Secondary | ICD-10-CM

## 2020-02-29 DIAGNOSIS — Z923 Personal history of irradiation: Secondary | ICD-10-CM | POA: Insufficient documentation

## 2020-02-29 DIAGNOSIS — I10 Essential (primary) hypertension: Secondary | ICD-10-CM

## 2020-02-29 NOTE — Progress Notes (Signed)
Radiation Oncology Follow up Note  Name: Kaylee Romero   Date:   02/29/2020 MRN:  [id number] DOB: [birth year]    This 40 y.o. 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  REFERRING PROVIDER: Linda Hedges, DO  HPI: Patient is a 40 year old female now out 6 months having completed whole breast radiation to her right breast for stage IIb invasive mammary carcinoma.  Seen today in routine follow-up she is doing well.  She specifically denies breast tenderness cough or bone pain.  She has had her Port-A-Cath removed..  She is currently on tamoxifen tolerating it well.  She has had being some hypertension which I have referring her to internal medicine for work-up.  She had mammograms in October which I have reviewed were BI-RADS 2 benign.  COMPLICATIONS OF TREATMENT: none  FOLLOW UP COMPLIANCE: keeps appointments   PHYSICAL EXAM:  BP 130/90 (BP Location: Left Arm, Patient Position: Sitting, Cuff Size: Normal)   Pulse (!) 111   Temp (!) 97.4 F (36.3 C) (Tympanic)   Resp 16   Wt 168 lb 14.4 oz (76.6 kg)   BMI 28.11 kg/m  Lungs are clear to A&P cardiac examination essentially unremarkable with regular rate and rhythm. No dominant mass or nodularity is noted in either breast in 2 positions examined. Incision is well-healed. No axillary or supraclavicular adenopathy is appreciated. Cosmetic result is excellent.  Well-developed well-nourished patient in NAD. HEENT reveals PERLA, EOMI, discs not visualized.  Oral cavity is clear. No oral mucosal lesions are identified. Neck is clear without evidence of cervical or supraclavicular adenopathy. Lungs are clear to A&P. Cardiac examination is essentially unremarkable with regular rate and rhythm without murmur rub or thrill. Abdomen is benign with no organomegaly or masses noted. Motor sensory and DTR  levels are equal and symmetric in the upper and lower extremities. Cranial nerves II through XII are grossly intact. Proprioception is intact. No peripheral adenopathy or edema is identified. No motor or sensory levels are noted. Crude visual fields are within normal range.  RADIOLOGY RESULTS: Mammograms reviewed compatible with above-stated findings  PLAN: Present time patient is doing well 6 months out with no evidence of disease.  I am pleased with her overall progress and cosmetic result.  She continues on tamoxifen without side effect.  I have referring her to Dr. Netty Starring extranodal clinic for work-up of her hypertension.  It appears borderline but we will establish care in his department.  I have asked to see her back in 6 months for follow-up.  Patient knows to call with any concerns.  I would like to take this opportunity to thank you for allowing me to participate in the care of your patient.Noreene Filbert, MD

## 2020-02-29 NOTE — Telephone Encounter (Signed)
Faxed referral over to Dr. Netty Starring at [facility]. Fax [phone]

## 2020-03-06 DIAGNOSIS — Z853 Personal history of malignant neoplasm of breast: Secondary | ICD-10-CM | POA: Insufficient documentation

## 2020-03-06 DIAGNOSIS — I10 Essential (primary) hypertension: Secondary | ICD-10-CM | POA: Insufficient documentation

## 2020-03-12 ENCOUNTER — Telehealth: Payer: Self-pay

## 2020-03-12 NOTE — Telephone Encounter (Signed)
Returned call to pt. Pt reporting left breast pain, at the top of the breast similar to feeling like a bruise. Pt states she has no palpable mass, no redness or discoloration. Pt is concerned since this is the opposite breast from her BR CA site.Pt states she has not had a menstrual period for 3 months but does have an IUD in place. Pt also states she has been having hot flashes. Pt has an appointment to come in, in April and I requested scheduling contact her to bring pt in sooner as pt was agreeable. Pt to call back if the pain increases.

## 2020-03-13 ENCOUNTER — Telehealth: Payer: Self-pay | Admitting: Oncology

## 2020-03-13 NOTE — Telephone Encounter (Signed)
Rescheduled appointments per 03/08 schedule message. Attempted to contact patient, left a detailed message.

## 2020-03-19 ENCOUNTER — Inpatient Hospital Stay: Payer: BC Managed Care – PPO

## 2020-03-19 ENCOUNTER — Inpatient Hospital Stay: Payer: BC Managed Care – PPO | Admitting: Oncology

## 2020-04-01 DIAGNOSIS — F411 Generalized anxiety disorder: Secondary | ICD-10-CM | POA: Insufficient documentation

## 2020-04-01 NOTE — Progress Notes (Signed)
[facility]  [phone] [phone]     ID: TRYSTIN HARGROVE DOB: [birth year]  MR#: [id number]  EVL#:[id number]  Patient Care Team: Mitchel Honour, DO as PCP - General (Obstetrics and Gynecology) Antony Blackbird, MD as Consulting Physician (Radiation Oncology) Amaris Delafuente, Valentino Hue, MD as Consulting Physician (Oncology) Griselda Miner, MD as Consulting Physician (General Surgery) Dillingham, Alena Bills, DO as Attending Physician (Plastic Surgery) Carmina Miller, MD as Radiation Oncologist (Radiation Oncology) Lowella Dell, MD OTHER MD:  CHIEF COMPLAINT: estrogen receptor positive breast cancer  CURRENT TREATMENT: Tamoxifen   INTERVAL HISTORY: Cherylanne  returns today for follow-up of her estrogen receptor positive breast cancer.   She continues on Tamoxifen daily and is tolerating it moderately well.  She does have nighttime hot flashes.  She was taking gabapentin 300 mg but it made her a little groggy in the morning so she stopped it.  She is tolerating the hot flashes pretty much by using a fan at night and not wearing a lot of close to bed.  Since her last visit, she has not undergone any additional studies. She is up to date on mammography, most recently 10/17/2019.   REVIEW OF SYSTEMS: Prisma moved up this visit because she has a strange sensation in the upper portion of the left breast.  This breast had reduction mammoplasty but no cancer.  The sensation is more like a soreness.  It has been there at least a week if not longer.  It is not associated with redness and she does not palpate anything in that area.  Aside from that she tells me her periods returned since her last visit and for about 3 months she had regular periods every 26 to 27 days lasting 3 to 4days.  Then about 3 months ago the PE periods again stopped.  She still has a Mirena in place.  A detailed review of systems today was otherwise stable  COVID 19 VACCINATION STATUS: Moderna x1 in  03/2019   HISTORY OF CURRENT ILLNESS: From the original intake note:  Damyah B Tornow (pronounced "Bram-Hall") found a palpable lump in the 12 o'clock position of the right breast, which he brought to the attention of her gynecologist.. She underwent bilateral diagnostic mammography with tomography and bilateral breast ultrasonography at [facility] on 09/23/2018 showing: breast density category C; a spiculated mass with associated with architectural distortion and pleomorphic calcifications lying in the 11-12 o'clock position, middle depth, measuring 2 cm in size, corresponding to the palpable abnormality.   Physical exam confirmed a firm but mobile mass in the 12 o'clock position of the right breast.  By ultrasound there was a highly suspicious 2.1 cm mass in the 12 o'clock position of the right breast; no other evidence of breast malignancy or lymphadenopathy bilaterally.Sonographic evaluation of the right axilla found no enlarged or abnormal appearing lymph node  In the left axilla there were several lymph nodes with areas of cortical prominence similar to the lymph node noted on the right.  Given the symmetry all these are presumed to be reactive  Accordingly on 09/26/2018 she proceeded to biopsy of the right breast area in question. The pathology from this procedure ([id number]) showed: invasive ductal carcinoma, grade 2; prognostic indicators significant for: estrogen receptor, 80% positive with moderate staining intensity and progesterone receptor, 90% positive with strong staining intensity. Proliferation marker Ki67 at 35%. HER2 negative by immunohistochemistry (1+).  The patient's subsequent history is as detailed below.   PAST MEDICAL HISTORY:  Past Medical History:  Diagnosis Date  . Anxiety   . Family history of breast cancer   . HSV (herpes simplex virus) anogenital infection    positive titer only    PAST SURGICAL HISTORY: Past Surgical History:  Procedure Laterality  Date  . BREAST LUMPECTOMY WITH AXILLARY LYMPH NODE BIOPSY Right 11/21/2018   Procedure: RIGHT BREAST LUMPECTOMY WITH SENTINEL LYMPH NODE BIOPSY;  Surgeon: Jovita Kussmaul, MD;  Location: [facility];  Service: General;  Laterality: Right;  . BREAST REDUCTION SURGERY Bilateral 11/21/2018   Procedure: BILATERAL MAMMARY REDUCTION  (BREAST);  Surgeon: Wallace Going, DO;  Location: [place];  Service: Plastics;  Laterality: Bilateral;  . PORT-A-CATH REMOVAL N/A 07/06/2019   Procedure: REMOVAL PORT-A-CATH;  Surgeon: Jovita Kussmaul, MD;  Location: [place];  Service: General;  Laterality: N/A;  . PORTACATH PLACEMENT N/A 11/21/2018   Procedure: INSERTION LEFT PORT-A-CATH WITH ULTRASOUND GUIDANCE;  Surgeon: Jovita Kussmaul, MD;  Location: [place];  Service: General;  Laterality: N/A;  . TONSILLECTOMY  1987  . TONSILLECTOMY    . WISDOM TOOTH EXTRACTION      FAMILY HISTORY: Family History  Problem Relation Age of Onset  . Hypertension Father   . Alcohol abuse Paternal Grandfather   . Heart disease Paternal Grandfather   . Alcohol abuse Maternal Grandfather   . Heart disease Maternal Grandmother   . Breast cancer Other    Patient's parents are both living at age 42, as of 09/2018. Her father lives in [place], and her mother lives in [place], [place]. The patient denies a family hx of ovarian cancer. A maternal great aunt was diagnosed with breast cancer twice. She has 2 brothers, no sisters   GYNECOLOGIC HISTORY:  No LMP recorded. (Menstrual status: Chemotherapy). Menarche: 40 years old Age at first live birth: 40 years old [place] P 1 Currently having regular periods  Contraceptive: Mirena IUD in place HRT n/a  Hysterectomy? no BSO? no   SOCIAL HISTORY: (updated March 2022) Kimmie is a [employer]. Husband Tommi Rumps is in management with [employer]. Daughter Normand Sloop is 60 years old    ADVANCED DIRECTIVES: In the absence of any documents to the contrary her husband is automatically  her HCPOA.   HEALTH MAINTENANCE: Social History   Tobacco Use  . Smoking status: Former Smoker    Packs/day: 0.50    Years: 10.00    Pack years: 5.00    Types: Cigarettes    Start date: 01/13/2002    Quit date: 10/17/2016    Years since quitting: 3.4  . Smokeless tobacco: Never Used  Vaping Use  . Vaping Use: Never used  Substance Use Topics  . Alcohol use: Yes    Comment: Socially  . Drug use: No     Colonoscopy: n/a  PAP: 09/20/2018  Bone density: n/a   Allergies  Allergen Reactions  . Betadine [Povidone Iodine] Rash    Current Outpatient Medications  Medication Sig Dispense Refill  . amphetamine-dextroamphetamine (ADDERALL XR) 20 MG 24 hr capsule Take 20 mg by mouth daily.    . Ascorbic Acid (VITAMIN C) 1000 MG tablet Take 1,000 mg by mouth daily.    . citalopram (CELEXA) 20 MG tablet Take 1 tablet by mouth daily.    . hydrOXYzine (ATARAX/VISTARIL) 10 MG tablet Take 10 mg by mouth at bedtime.     . Multiple Vitamin (MULTIVITAMIN WITH MINERALS) TABS tablet Take 1 tablet by mouth daily.    . naproxen sodium (ALEVE) 220  MG tablet Take 1 tablet (220 mg total) by mouth 3 (three) times daily with meals. 60 tablet 3  . tamoxifen (NOLVADEX) 20 MG tablet Take 20 mg by mouth daily.    . Tetrahydrozoline HCl (VISINE OP) Place 1 drop into both eyes daily as needed (redness).    . venlafaxine XR (EFFEXOR-XR) 75 MG 24 hr capsule Take 1 capsule (75 mg total) by mouth daily. 90 capsule 3  . vitamin B-12 (CYANOCOBALAMIN) 1000 MCG tablet Take 1,000 mcg by mouth daily.     No current facility-administered medications for this visit.   Facility-Administered Medications Ordered in Other Visits  Medication Dose Route Frequency Provider Last Rate Last Admin  . sodium chloride flush (NS) 0.9 % injection 10 mL  10 mL Intracatheter PRN Harla Mensch, Virgie Dad, MD   10 mL at 05/18/19 1553    OBJECTIVE: White woman who appears well  Vitals:   04/02/20 1524  BP: 122/72  Pulse: 95  Resp: 18   Temp: 97.9 F (36.6 C)  SpO2: 100%   Wt Readings from Last 3 Encounters:  04/02/20 170 lb 3.2 oz (77.2 kg)  02/29/20 168 lb 14.4 oz (76.6 kg)  02/27/20 168 lb (76.2 kg)   Body mass index is 28.32 kg/m.    ECOG FS:1 - Symptomatic but completely ambulatory  Sclerae unicteric, EOMs intact Wearing a mask No cervical or supraclavicular adenopathy Lungs no rales or rhonchi Heart regular rate and rhythm Abd soft, nontender, positive bowel sounds MSK no focal spinal tenderness, no upper extremity lymphedema Neuro: nonfocal, well oriented, appropriate affect Breasts: The right breast is status post lumpectomy and radiation.  There is some minimal residual hyperpigmentation and some skin coarsening but no evidence of local recurrence.  Left breast is status post reduction mammoplasty.  In the superior aspect centrally there is an area where there is some stranding.  There is no erythema.  There is no tenderness.  This is the area that she feels a different sensation in.  Both axillae are benign.   LAB RESULTS:  CMP     Component Value Date/Time   NA 138 04/02/2020 1509   K 4.2 04/02/2020 1509   CL 100 04/02/2020 1509   CO2 28 04/02/2020 1509   GLUCOSE 93 04/02/2020 1509   BUN 18 04/02/2020 1509   CREATININE 0.71 04/02/2020 1509   CREATININE 0.59 06/01/2019 1340   CALCIUM 9.3 04/02/2020 1509   PROT 7.6 04/02/2020 1509   ALBUMIN 4.4 04/02/2020 1509   AST 24 04/02/2020 1509   AST 21 06/01/2019 1340   ALT 33 04/02/2020 1509   ALT 26 06/01/2019 1340   ALKPHOS 45 04/02/2020 1509   BILITOT 0.4 04/02/2020 1509   BILITOT 0.5 06/01/2019 1340   GFRNONAA >60 04/02/2020 1509   GFRNONAA >60 06/01/2019 1340   GFRAA >60 08/07/2019 1020   GFRAA >60 06/01/2019 1340    No results found for: TOTALPROTELP, ALBUMINELP, A1GS, A2GS, BETS, BETA2SER, GAMS, MSPIKE, SPEI  No results found for: KPAFRELGTCHN, LAMBDASER, KAPLAMBRATIO  Lab Results  Component Value Date   WBC 7.4 04/02/2020    NEUTROABS 4.7 04/02/2020   HGB 14.1 04/02/2020   HCT 40.8 04/02/2020   MCV 94.0 04/02/2020   PLT 241 04/02/2020    No results found for: LABCA2  No components found for: AYTKZS010  No results for input(s): INR in the last 168 hours.  No results found for: LABCA2  No results found for: XNA355  No results found for: DDU202  No results  found for: YHC623  No results found for: CA2729  No components found for: HGQUANT  No results found for: CEA1 / No results found for: CEA1   No results found for: AFPTUMOR  No results found for: CHROMOGRNA  No results found for: HGBA, HGBA2QUANT, HGBFQUANT, HGBSQUAN (Hemoglobinopathy evaluation)   No results found for: LDH  No results found for: IRON, TIBC, IRONPCTSAT (Iron and TIBC)  No results found for: FERRITIN  Urinalysis    Component Value Date/Time   BILIRUBINUR Neg 01/18/2012 1458   PROTEINUR Neg 01/18/2012 1458   UROBILINOGEN 0.2 01/18/2012 1458   NITRITE Neg 01/18/2012 1458   LEUKOCYTESUR Negative 01/18/2012 1458    STUDIES: No results found.   ELIGIBLE FOR AVAILABLE RESEARCH PROTOCOL: no  ASSESSMENT: 40 y.o. [place] woman status post right breast upper inner quadrant biopsy on 09/26/2018 for a clinical T2 N0, stage IB invasive ductal carcinoma, grade 2, estrogen and progesterone receptor positive, HER-2 nonamplified, with an MIB-1-1 of 35%  (1) Genetic testing reported on 10/05/2018 through the [facility] Breast Cancer STAT Panel + Common Hereditary cancer panel found no pathogenic mutations. The STAT Breast cancer panel offered by [facility] includes sequencing and rearrangement analysis for the following 9 genes:  ATM, BRCA1, BRCA2, CDH1, CHEK2, PALB2, PTEN, STK11 and TP53. The Common Hereditary Cancers Panel offered by [facility] includes sequencing and/or deletion duplication testing of the following 47 genes: APC, ATM, AXIN2, BARD1, BMPR1A, BRCA1, BRCA2, BRIP1, CDH1, CDKN2A (p14ARF), CDKN2A (p16INK4a), CKD4, CHEK2,  CTNNA1, DICER1, EPCAM (Deletion/duplication testing only), GREM1 (promoter region deletion/duplication testing only), KIT, MEN1, MLH1, MSH2, MSH3, MSH6, MUTYH, NBN, NF1, NHTL1, PALB2, PDGFRA, PMS2, POLD1, POLE, PTEN, RAD50, RAD51C, RAD51D, SDHB, SDHC, SDHD, SMAD4, SMARCA4. STK11, TP53, TSC1, TSC2, and VHL.  The following genes were evaluated for sequence changes only: SDHA and HOXB13 c.251G>A variant only.   (2) MammaPrint obtained from the original breast biopsy returned "high risk" indicating the patient will have an excellent prognosis if she receives adjuvant chemotherapy  (3) status post right lumpectomy and right axillary sentinel lymph node sampling 11/21/2018 for a pT2 pN1, stage IIA invasive ductal carcinoma, grade 2, with negative margins  (a) a total of 4 sentinel lymph nodes removed, 1 with macrometastasis  (b) status post bilateral breast reductions at the time of lumpectomy  (4) adjuvant chemotherapy consisting of cyclophosphamide and doxorubicin in dose dense fashion x4 started 01/05/2019, completed 02/15/2019, followed by weekly paclitaxel x12 started 03/02/2019  (a) echo 10/27/2018 shows an ejection fraction in the 55-60% range  (5) adjuvant radiation to be completed 08/11/2019 ([place])  (6) tamoxifen started 10/05/2018 in anticipation of surgical delays  (a) discontinued 12/05/2018 during surgery and chemotherapy  (b) resumed tamoxifen 09/06/2019  (c) Mirena IUD in place  (d) menstrual periods resumed fall 2021 and were regular for 3 or 4 months, stopping January 2022    PLAN: Luceil is now about a year and a half out from definitive surgery for her breast cancer with no evidence of disease recurrence.  This is very favorable.  She is tolerating tamoxifen generally well.  She does have nighttime hot flashes.  We could try gabapentin at a lower dose, 100 mg at night, but she wants to "get away from medication" and she is dealing with hot flashes well without gabapentin.  She  knows that the drug is available to her if she wishes.  When I am feeling in the left breast (it feels like a few rubber bands strong out in the upper part of the  left breast) does not suggest tumor but it is a difference.  She had mammography in October which was fine.  I am going to repeat a left mammogram and ultrasound now just to make sure were not dealing with anything of concern.  She will then have her regular mammography in October.  I will see her again in November of this year and at that point we will start a yearly follow-up  Total encounter time 25 minutes.Sarajane Jews C. Odalis Jordan, MD 04/02/20 3:47 PM Medical Oncology and Hematology [facility] [place], [place] [address] Tel. [phone]    Fax. [phone]   I, Wilburn Mylar, am acting as scribe for Dr. Virgie Dad. Altin Sease.  I, Lurline Del MD, have reviewed the above documentation for accuracy and completeness, and I agree with the above.    *Total Encounter Time as defined by the Centers for Medicare and Medicaid Services includes, in addition to the face-to-face time of a patient visit (documented in the note above) non-face-to-face time: obtaining and reviewing outside history, ordering and reviewing medications, tests or procedures, care coordination (communications with other health care professionals or caregivers) and documentation in the medical record.

## 2020-04-02 ENCOUNTER — Inpatient Hospital Stay: Payer: BC Managed Care – PPO | Attending: Oncology | Admitting: Oncology

## 2020-04-02 ENCOUNTER — Other Ambulatory Visit: Payer: Self-pay

## 2020-04-02 ENCOUNTER — Inpatient Hospital Stay: Payer: BC Managed Care – PPO

## 2020-04-02 VITALS — BP 122/72 | HR 95 | Temp 97.9°F | Resp 18 | Ht 65.0 in | Wt 170.2 lb

## 2020-04-02 DIAGNOSIS — Z79810 Long term (current) use of selective estrogen receptor modulators (SERMs): Secondary | ICD-10-CM | POA: Insufficient documentation

## 2020-04-02 DIAGNOSIS — Z170 Estrogen receptor positive status [ER+]: Secondary | ICD-10-CM

## 2020-04-02 DIAGNOSIS — Z79899 Other long term (current) drug therapy: Secondary | ICD-10-CM | POA: Diagnosis not present

## 2020-04-02 DIAGNOSIS — Z87891 Personal history of nicotine dependence: Secondary | ICD-10-CM | POA: Diagnosis not present

## 2020-04-02 DIAGNOSIS — C50211 Malignant neoplasm of upper-inner quadrant of right female breast: Secondary | ICD-10-CM | POA: Insufficient documentation

## 2020-04-02 DIAGNOSIS — C50411 Malignant neoplasm of upper-outer quadrant of right female breast: Secondary | ICD-10-CM | POA: Diagnosis not present

## 2020-04-02 LAB — CBC WITH DIFFERENTIAL/PLATELET
Abs Immature Granulocytes: 0.03 10*3/uL (ref 0.00–0.07)
Basophils Absolute: 0.1 10*3/uL (ref 0.0–0.1)
Basophils Relative: 1 %
Eosinophils Absolute: 0.2 10*3/uL (ref 0.0–0.5)
Eosinophils Relative: 3 %
HCT: 40.8 % (ref 36.0–46.0)
Hemoglobin: 14.1 g/dL (ref 12.0–15.0)
Immature Granulocytes: 0 %
Lymphocytes Relative: 24 %
Lymphs Abs: 1.8 10*3/uL (ref 0.7–4.0)
MCH: 32.5 pg (ref 26.0–34.0)
MCHC: 34.6 g/dL (ref 30.0–36.0)
MCV: 94 fL (ref 80.0–100.0)
Monocytes Absolute: 0.6 10*3/uL (ref 0.1–1.0)
Monocytes Relative: 8 %
Neutro Abs: 4.7 10*3/uL (ref 1.7–7.7)
Neutrophils Relative %: 64 %
Platelets: 241 10*3/uL (ref 150–400)
RBC: 4.34 MIL/uL (ref 3.87–5.11)
RDW: 11.8 % (ref 11.5–15.5)
WBC: 7.4 10*3/uL (ref 4.0–10.5)
nRBC: 0 % (ref 0.0–0.2)

## 2020-04-02 LAB — COMPREHENSIVE METABOLIC PANEL
ALT: 33 U/L (ref 0–44)
AST: 24 U/L (ref 15–41)
Albumin: 4.4 g/dL (ref 3.5–5.0)
Alkaline Phosphatase: 45 U/L (ref 38–126)
Anion gap: 10 (ref 5–15)
BUN: 18 mg/dL (ref 6–20)
CO2: 28 mmol/L (ref 22–32)
Calcium: 9.3 mg/dL (ref 8.9–10.3)
Chloride: 100 mmol/L (ref 98–111)
Creatinine, Ser: 0.71 mg/dL (ref 0.44–1.00)
GFR, Estimated: >60 mL/min (ref >60)
Glucose, Bld: 93 mg/dL (ref 70–99)
Potassium: 4.2 mmol/L (ref 3.5–5.1)
Sodium: 138 mmol/L (ref 135–145)
Total Bilirubin: 0.4 mg/dL (ref 0.3–1.2)
Total Protein: 7.6 g/dL (ref 6.5–8.1)

## 2020-04-03 ENCOUNTER — Other Ambulatory Visit: Payer: Self-pay

## 2020-04-03 ENCOUNTER — Ambulatory Visit: Payer: BC Managed Care – PPO | Admitting: Dermatology

## 2020-04-03 ENCOUNTER — Encounter: Payer: Self-pay | Admitting: Dermatology

## 2020-04-03 DIAGNOSIS — L578 Other skin changes due to chronic exposure to nonionizing radiation: Secondary | ICD-10-CM | POA: Diagnosis not present

## 2020-04-03 DIAGNOSIS — L910 Hypertrophic scar: Secondary | ICD-10-CM

## 2020-04-03 DIAGNOSIS — L821 Other seborrheic keratosis: Secondary | ICD-10-CM

## 2020-04-03 DIAGNOSIS — L920 Granuloma annulare: Secondary | ICD-10-CM | POA: Diagnosis not present

## 2020-04-03 MED ORDER — TACROLIMUS 0.1 % EX OINT: TOPICAL_OINTMENT | Freq: Two times a day (BID) | CUTANEOUS | 3 refills | Status: DC

## 2020-04-03 NOTE — Progress Notes (Signed)
   Follow-Up Visit   Subjective  Kaylee Romero is a 40 y.o. 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 Patient recently received ILK injections at last visit. ).   The following portions of the chart were reviewed this encounter and updated as appropriate:  Tobacco  Allergies  Meds  Problems  Med Hx  Surg Hx  Fam Hx      Objective  Well appearing patient in no apparent distress; mood and affect are within normal limits.  A focused examination was performed including bilateral hands, chest . Relevant physical exam findings are noted in the Assessment and Plan.  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 Condition is bothersome to patient. Not currently at goal.  Improved s/p ILK injections.  Start Tacrolimus 0.1 % ointment apply to affected areas twice daily.             tacrolimus (PROTOPIC) 0.1 % ointment - Right Hand - Anterior  Hypertrophic scar left lateral chest x 2  Scar at port site Favor acne scar at medial chest  Location: left lateral chest, left medial chest  Intralesional steroid injection side effects were reviewed including thinning of the skin and discoloration, such as redness, lightening or darkening.   Intralesional injection - left lateral chest x 2 Location: left lateral chest   Informed Consent: Discussed risks (infection, pain, bleeding, bruising, thinning of the skin, loss of skin pigment, lack of resolution, and recurrence of lesion) and benefits of the procedure, as well as the alternatives. Informed consent was obtained. Preparation: The area was prepared a standard fashion.  Procedure Details: An intralesional  injection was performed with Kenolog 7.5 ml. 0.15 cc in total were injected.  Total number of injections: 5  Plan: The patient was instructed on post-op care. Recommend OTC analgesia as needed for pain.    Seborrheic Keratoses - Stuck-on, waxy, tan-brown papules and/or plaques under bilateral breast  - Benign-appearing - Discussed benign etiology and prognosis. - Observe - Call for any changes  Actinic Damage - chronic, secondary to cumulative UV radiation exposure/sun exposure over time - diffuse scaly erythematous macules with underlying dyspigmentation - Recommend daily broad spectrum sunscreen SPF 30+ to sun-exposed areas, reapply every 2 hours as needed.  - Recommend staying in the shade or wearing long sleeves, sun glasses (UVA+UVB protection) and wide brim hats (4-inch brim around the entire circumference of the hat). - Call for new or changing lesions.  Return in about 3 months (around 07/04/2020) for GA on hand follow up.  I, Ruthell Rummage, CMA, am acting as scribe for Forest Gleason, MD.  Documentation: I have reviewed the above documentation for accuracy and completeness, and I agree with the above.  Forest Gleason, MD

## 2020-04-03 NOTE — Patient Instructions (Addendum)
Recommend taking Heliocare sun protection supplement daily in sunny weather for additional sun protection. For maximum protection on the sunniest days, you can take up to 2 capsules of regular Heliocare OR take 1 capsule of Heliocare Ultra. For prolonged exposure (such as a full day in the sun), you can repeat your dose of the supplement 4 hours after your first dose. Heliocare can be purchased at [facility] or at [email or web address].   Recommend daily broad spectrum sunscreen SPF 30+ to sun-exposed areas, reapply every 2 hours as needed. Call for new or changing lesions.  Staying in the shade or wearing long sleeves, sun glasses (UVA+UVB protection) and wide brim hats (4-inch brim around the entire circumference of the hat) are also recommended for sun protection.   Intralesional steroid injection side effects were reviewed including thinning of the skin and discoloration, such as redness, lightening or darkening.  If you have any questions or concerns for your doctor, please call our main line at [phone] and press option 4 to reach your doctor's medical assistant. If no one answers, please leave a voicemail as directed and we will return your call as soon as possible. Messages left after 4 pm will be answered the following business day.   You may also send [place] a message via [place]. We typically respond to MyChart messages within 1-2 business days.  For prescription refills, please ask your pharmacy to contact our office. Our fax number is [phone].  If you have an urgent issue when the clinic is closed that cannot wait until the next business day, you can page your doctor at the number below.    Please note that while we do our best to be available for urgent issues outside of office hours, we are not available 24/7.   If you have an urgent issue and are unable to reach [place], you may choose to seek medical care at your doctor's office, retail clinic, urgent care center, or emergency  room.  If you have a medical emergency, please immediately call 911 or go to the emergency department.  Pager Numbers  - Dr. Nehemiah Massed: [phone]  - Dr. Laurence Ferrari: [phone]  - Dr. Nicole Kindred: [phone]  In the event of inclement weather, please call our main line at [phone] for an update on the status of any delays or closures.  Dermatology Medication Tips: Please keep the boxes that topical medications come in in order to help keep track of the instructions about where and how to use these. Pharmacies typically print the medication instructions only on the boxes and not directly on the medication tubes.   If your medication is too expensive, please contact our office at [phone] option 4 or send [place] a message through [place].   We are unable to tell what your co-pay for medications will be in advance as this is different depending on your insurance coverage. However, we may be able to find a substitute medication at lower cost or fill out paperwork to get insurance to cover a needed medication.   If a prior authorization is required to get your medication covered by your insurance company, please allow [place] 1-2 business days to complete this process.  Drug prices often vary depending on where the prescription is filled and some pharmacies may offer cheaper prices.  The website www.goodrx.com contains coupons for medications through different pharmacies. The prices here do not account for what the cost may be with help from insurance (it may be cheaper with your insurance), but the  website can give you the price if you did not use any insurance.  - You can print the associated coupon and take it with your prescription to the pharmacy.  - You may also stop by our office during regular business hours and pick up a GoodRx coupon card.  - If you need your prescription sent electronically to a different pharmacy, notify our office through [facility] or by phone at [phone]  option 4.

## 2020-04-04 ENCOUNTER — Telehealth: Payer: Self-pay | Admitting: Oncology

## 2020-04-04 NOTE — Telephone Encounter (Signed)
Scheduled per 3/29 los. Called pt and left a msg

## 2020-04-30 ENCOUNTER — Ambulatory Visit: Payer: BC Managed Care – PPO | Admitting: Oncology

## 2020-04-30 ENCOUNTER — Other Ambulatory Visit: Payer: BC Managed Care – PPO

## 2020-05-14 ENCOUNTER — Other Ambulatory Visit: Payer: Self-pay

## 2020-05-14 ENCOUNTER — Ambulatory Visit
Admission: RE | Admit: 2020-05-14 | Discharge: 2020-05-14 | Disposition: A | Discharge status: Home / Self Care | Payer: BC Managed Care – PPO | Source: Ambulatory Visit | Attending: Oncology | Admitting: Oncology

## 2020-05-14 ENCOUNTER — Ambulatory Visit: Admission: RE | Admit: 2020-05-14 | Payer: BC Managed Care – PPO | Source: Ambulatory Visit

## 2020-05-14 DIAGNOSIS — Z170 Estrogen receptor positive status [ER+]: Secondary | ICD-10-CM

## 2020-05-14 IMAGING — MG MM DIGITAL DIAGNOSTIC UNILAT*L* W/ TOMO W/ CAD
4 series · 4 of 12 positions shown · non-contrast
Comparison: Previous exam(s).

CLINICAL DATA: 39-year-old female with history of right breast
cancer in [[place]] presenting for mild intermittent nonfocal left breast
tenderness.

EXAM:
DIGITAL DIAGNOSTIC UNILATERAL LEFT MAMMOGRAM WITH TOMOSYNTHESIS AND
CAD
TECHNIQUE: Left digital diagnostic mammography and breast tomosynthesis was
performed. The images were evaluated with computer-aided detection.

[L MLO synth-2D]
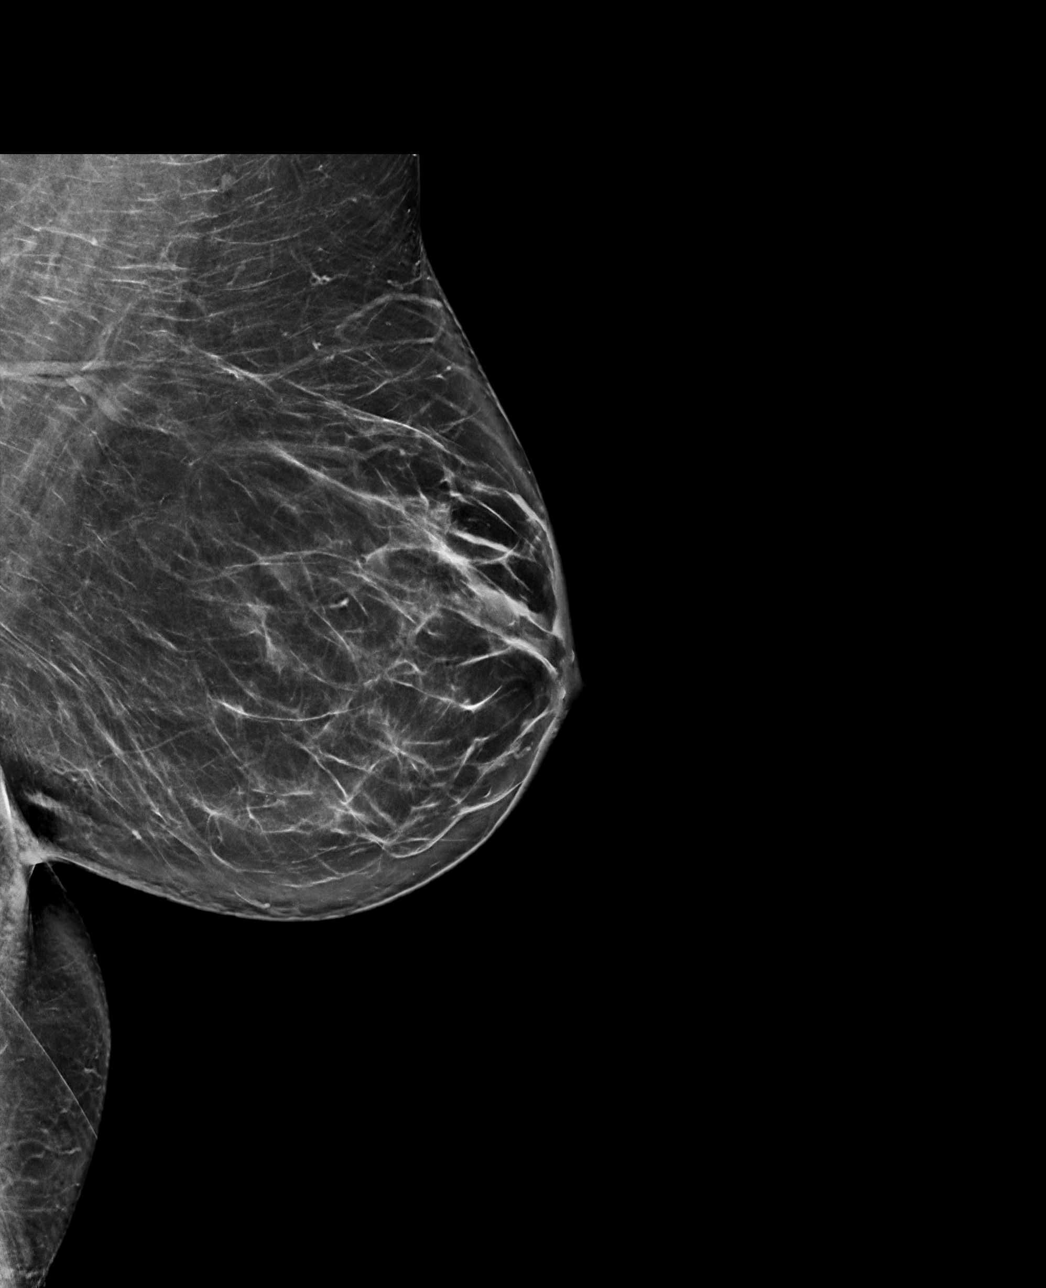

[L CC synth-2D]
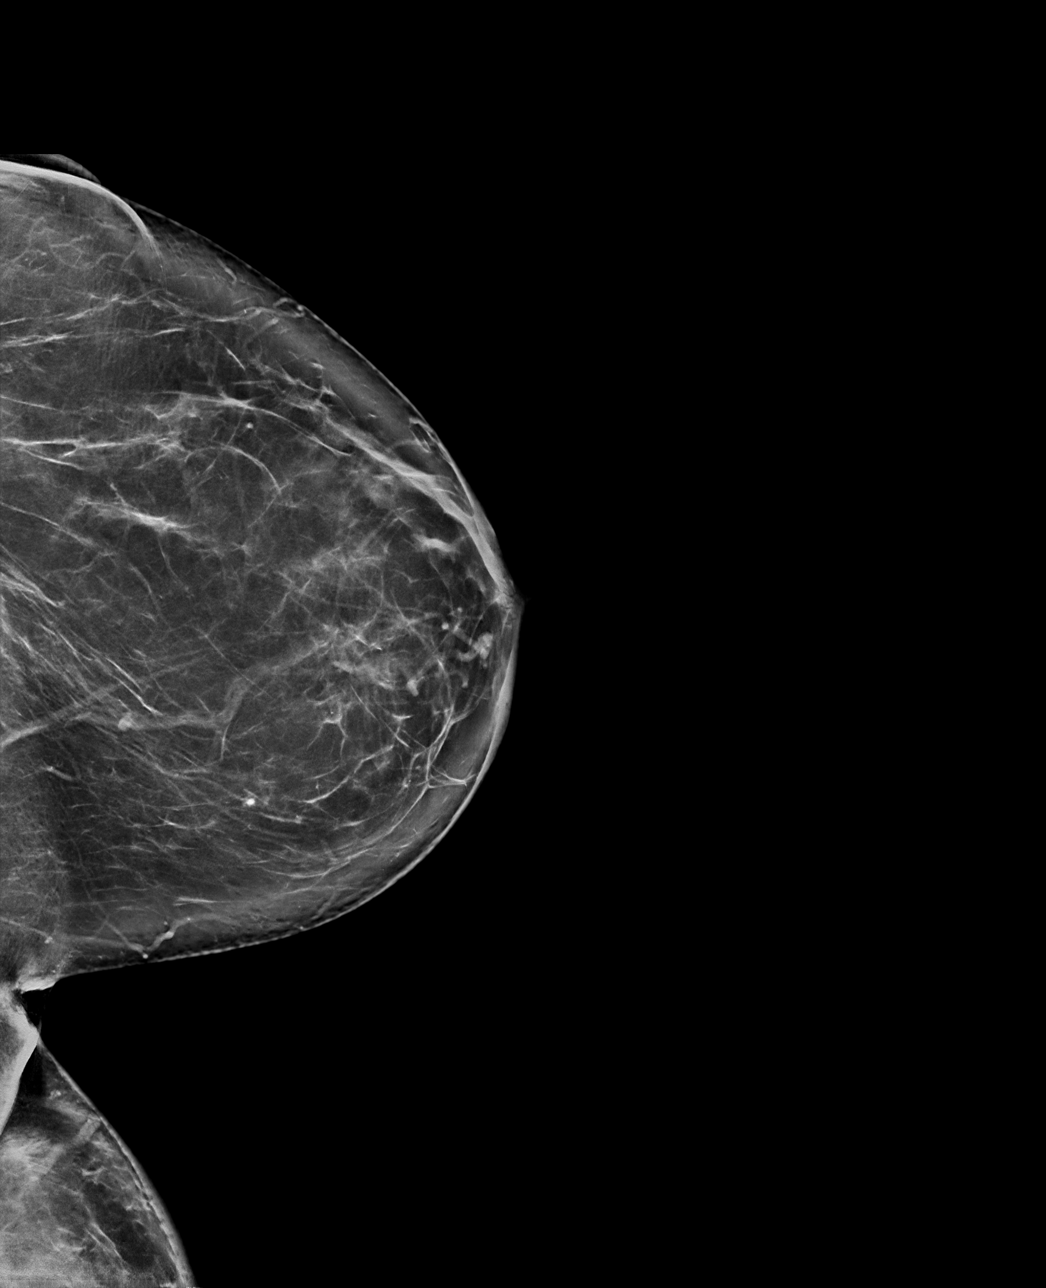

[L CC tomo · tomo slice 45/90.0]
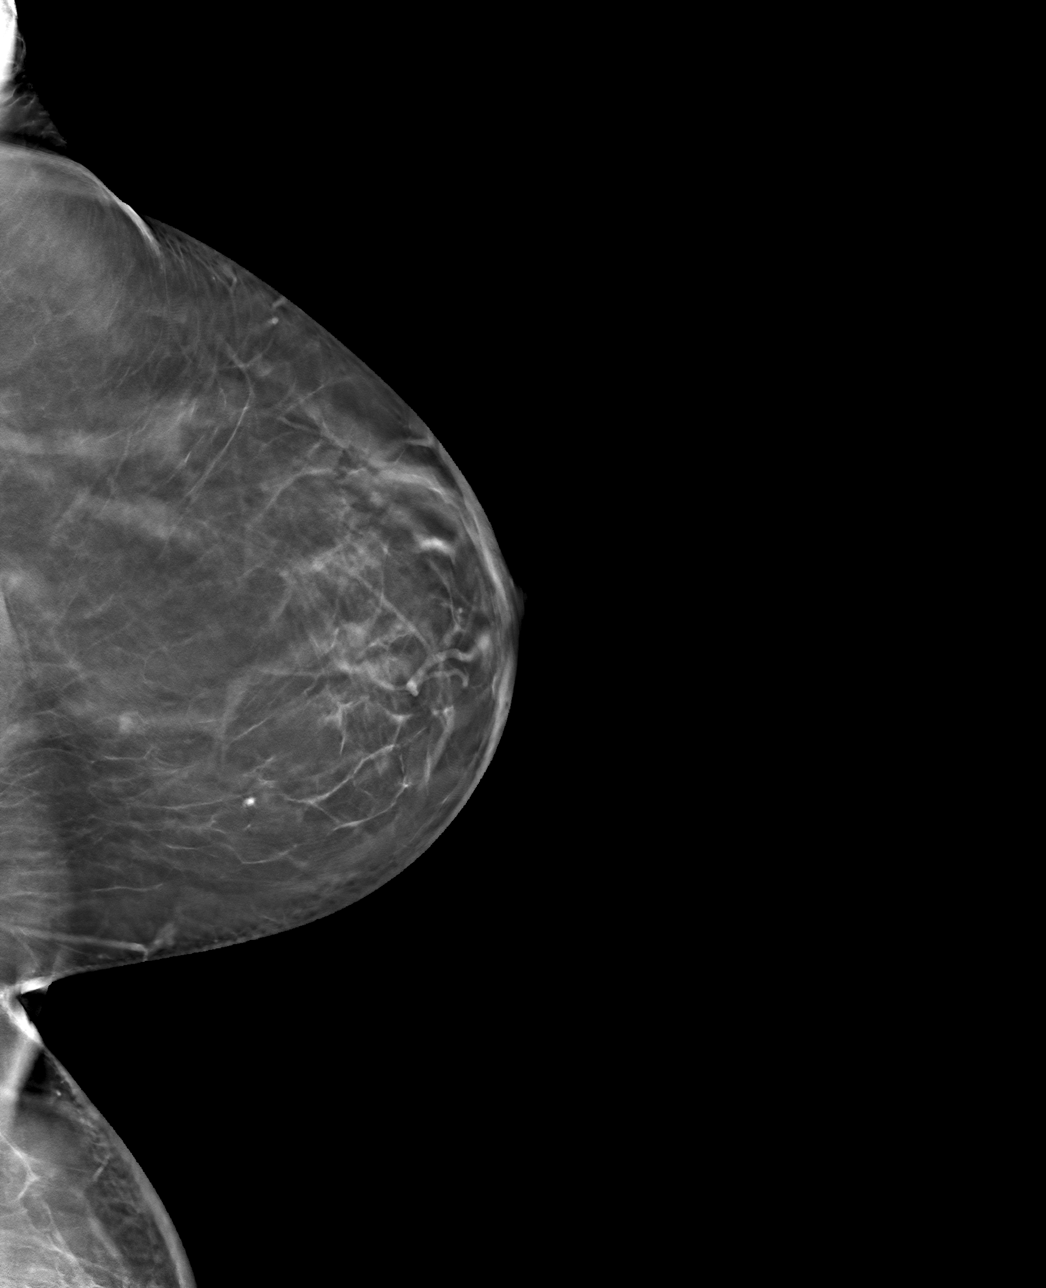

[L MLO tomo · tomo slice 42/83.0]
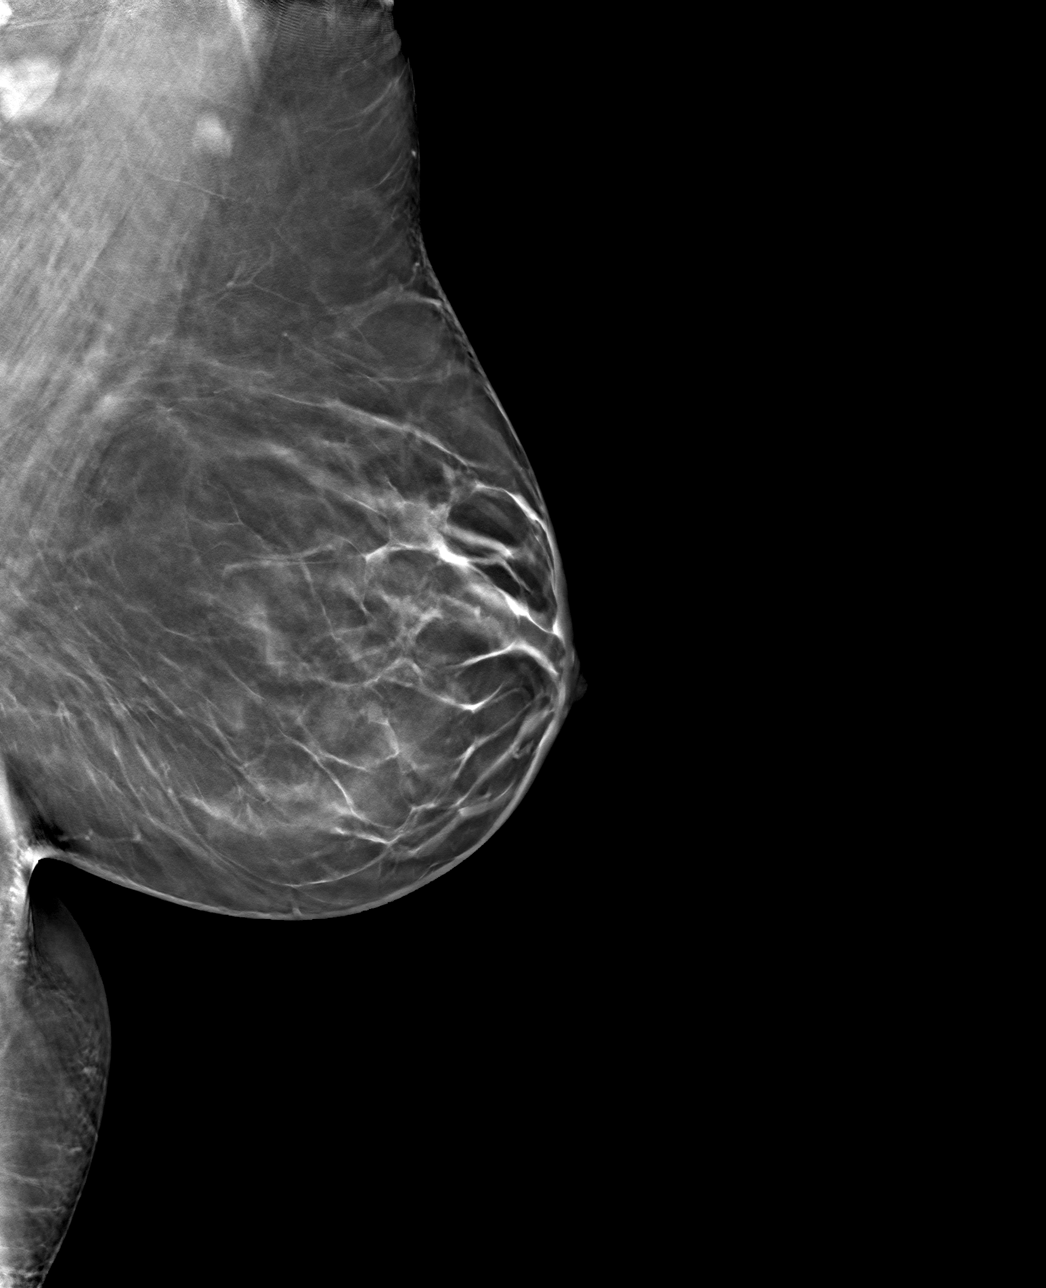

[4 of 12 positions shown; findings below may reference images not displayed]

ACR Breast Density Category b: There are scattered areas of
fibroglandular density.
FINDINGS: No suspicious calcifications, masses or areas of distortion are seen
in the left breast.
IMPRESSION: No abnormal mammographic findings in the left breast to correspond
with the patient's nonfocal left breast pain.

RECOMMENDATION:
Bilateral diagnostic mammography is due in [DATE] for
routine surveillance status post right breast lumpectomy.

I have discussed the findings and recommendations with the patient.
If applicable, a reminder letter will be sent to the patient
regarding the next appointment.

BI-RADS CATEGORY  1: Negative.

## 2020-06-04 ENCOUNTER — Ambulatory Visit: Payer: BC Managed Care – PPO | Admitting: Plastic Surgery

## 2020-06-26 ENCOUNTER — Other Ambulatory Visit: Payer: Self-pay | Admitting: Adult Health

## 2020-06-26 DIAGNOSIS — C50411 Malignant neoplasm of upper-outer quadrant of right female breast: Secondary | ICD-10-CM

## 2020-06-27 ENCOUNTER — Encounter: Payer: Self-pay | Admitting: Oncology

## 2020-07-01 DIAGNOSIS — R911 Solitary pulmonary nodule: Secondary | ICD-10-CM | POA: Insufficient documentation

## 2020-07-03 ENCOUNTER — Ambulatory Visit
Admission: RE | Admit: 2020-07-03 | Discharge: 2020-07-03 | Disposition: A | Discharge status: Home / Self Care | Payer: BC Managed Care – PPO | Source: Ambulatory Visit | Attending: Diagnostic Radiology | Admitting: Diagnostic Radiology

## 2020-07-03 ENCOUNTER — Other Ambulatory Visit: Payer: Self-pay | Admitting: Pulmonary Disease

## 2020-07-03 ENCOUNTER — Ambulatory Visit
Admission: RE | Admit: 2020-07-03 | Discharge: 2020-07-03 | Disposition: A | Discharge status: Home / Self Care | Payer: BC Managed Care – PPO | Source: Ambulatory Visit | Attending: Pulmonary Disease | Admitting: Pulmonary Disease

## 2020-07-03 ENCOUNTER — Other Ambulatory Visit: Payer: Self-pay

## 2020-07-03 ENCOUNTER — Other Ambulatory Visit: Payer: Self-pay | Admitting: Diagnostic Radiology

## 2020-07-03 DIAGNOSIS — J90 Pleural effusion, not elsewhere classified: Secondary | ICD-10-CM

## 2020-07-03 DIAGNOSIS — Z9889 Other specified postprocedural states: Secondary | ICD-10-CM

## 2020-07-03 DIAGNOSIS — Z853 Personal history of malignant neoplasm of breast: Secondary | ICD-10-CM | POA: Diagnosis not present

## 2020-07-03 LAB — BODY FLUID CELL COUNT WITH DIFFERENTIAL
Eos, Fluid: 0 %
Lymphs, Fluid: 97 %
Monocyte-Macrophage-Serous Fluid: 3 %
Neutrophil Count, Fluid: 0 %
Total Nucleated Cell Count, Fluid: 986 cu mm

## 2020-07-03 LAB — PROTEIN, PLEURAL OR PERITONEAL FLUID: Total protein, fluid: 4.3 g/dL

## 2020-07-03 LAB — LACTATE DEHYDROGENASE, PLEURAL OR PERITONEAL FLUID: LD, Fluid: 408 U/L — ABNORMAL HIGH (ref 3–23)

## 2020-07-03 LAB — GLUCOSE, PLEURAL OR PERITONEAL FLUID: Glucose, Fluid: 92 mg/dL

## 2020-07-03 LAB — AMYLASE, PLEURAL OR PERITONEAL FLUID: Amylase, Fluid: 39 U/L

## 2020-07-03 IMAGING — US US THORACENTESIS ASP PLEURAL SPACE W/IMG GUIDE
1 series · 2 of 2 positions shown · non-contrast
Comparison: none

INDICATION: 39-year-old with history of right breast cancer. Recently found to
have a large left pleural effusion and underwent a left
thoracentesis at a different facility. Patient presents for another
diagnostic and therapeutic thoracentesis.

[Series 1: us thoracentesis asp pleural space w/img guide · 0.30mm/px · 2 of 2 slices shown]
[im 1/2]
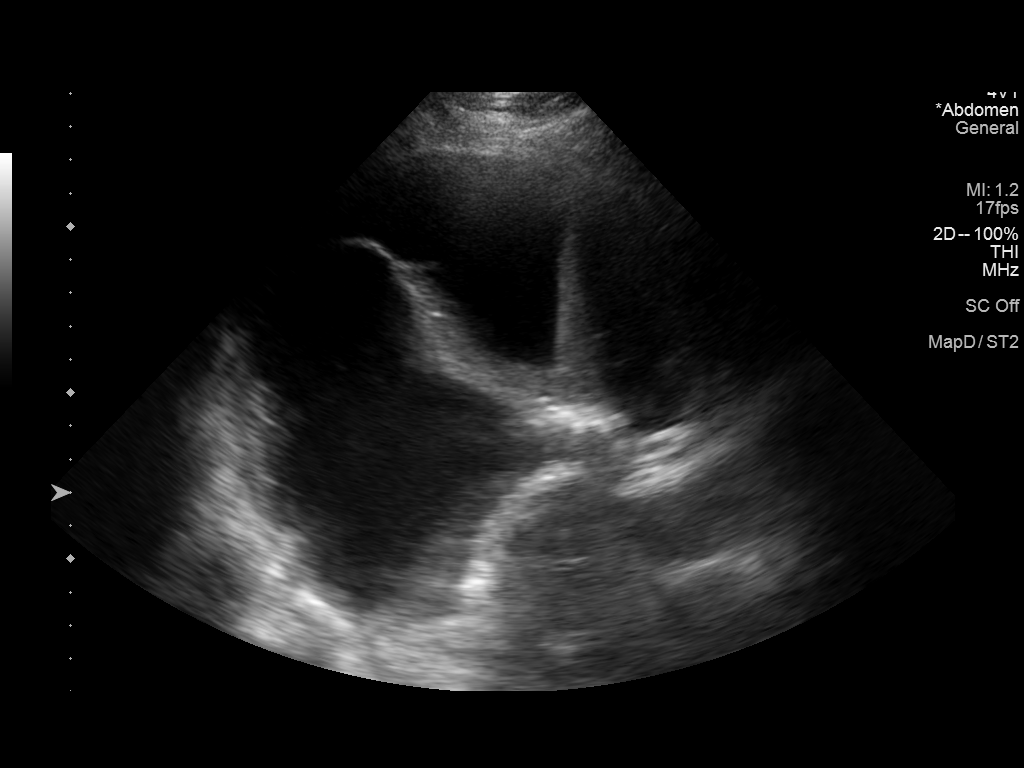
[im 2/2]
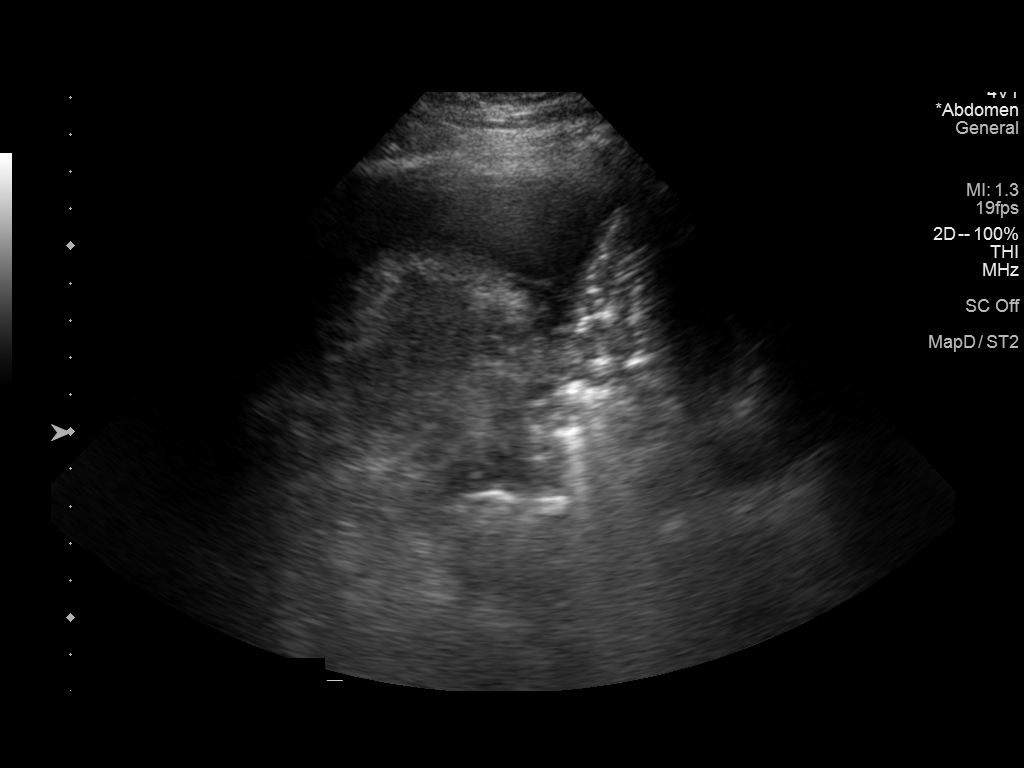

[2 of 2 positions shown; findings below may reference images not displayed]

EXAM:
ULTRASOUND GUIDED LEFT THORACENTESIS

MEDICATIONS:
None.

COMPLICATIONS:
None immediate.

PROCEDURE:
An ultrasound guided thoracentesis was thoroughly discussed with the
patient and questions answered. The benefits, risks, alternatives
and complications were also discussed. The patient understands and
wishes to proceed with the procedure. Written consent was obtained.

Ultrasound was performed to localize and mark an adequate pocket of
fluid in the left chest. The area was then prepped and draped in the
normal sterile fashion. 1% Lidocaine was used for local anesthesia.
Under ultrasound guidance a 6 Fr Safe-T-Centesis catheter was
introduced. Thoracentesis was performed. The catheter was removed
and a dressing applied.
FINDINGS: A total of approximately 700 mL of amber colored fluid was removed.
Samples were sent to the laboratory as requested by the clinical
team.
IMPRESSION: Successful ultrasound guided left thoracentesis yielding 700 mL of
pleural fluid.

## 2020-07-03 IMAGING — CT CT CHEST W/ CM
2 of 3 series · 15 of 36 positions shown, 18 images · IV contrast (omnipaque)
Comparison: None.

CLINICAL DATA: Left pleural effusion. Personal history of breast
carcinoma.

EXAM:
CT CHEST WITH CONTRAST
TECHNIQUE: Multidetector CT imaging of the chest was performed during
intravenous contrast administration.
CONTRAST:  75mL OMNIPAQUE IOHEXOL 300 MG/ML  SOLN

[Series 2: axial st · axial · 0.64mm/px · z∈[-293,-43]mm · 12 of 147 slices shown, 15 images]
[im 11/147  mediastinal]
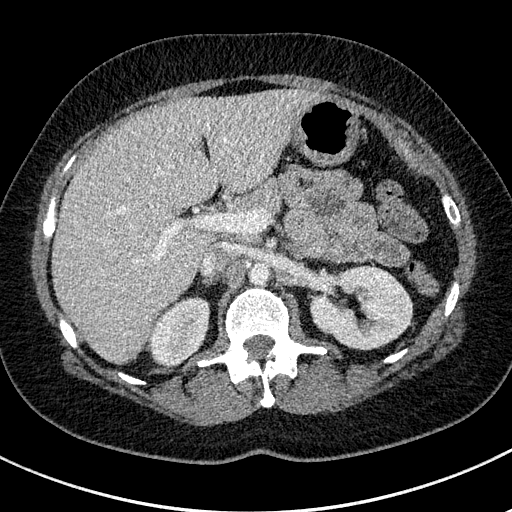
[im 11/147  lung]
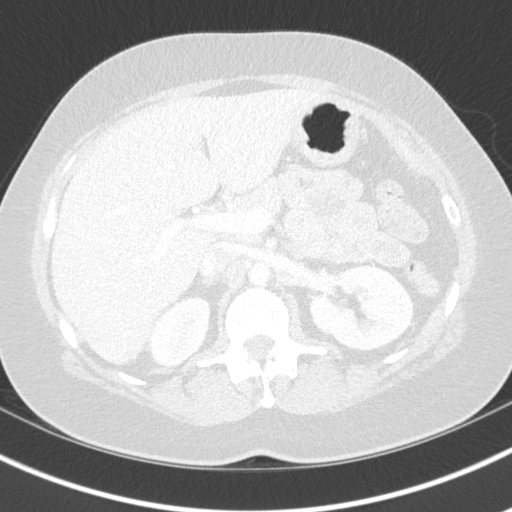
[im 22/147  lung]
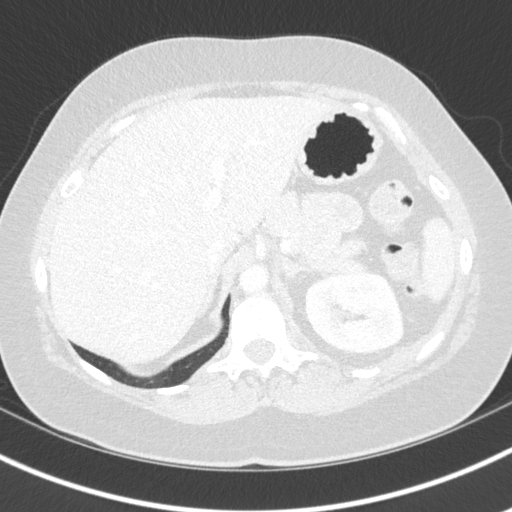
[im 33/147  lung]
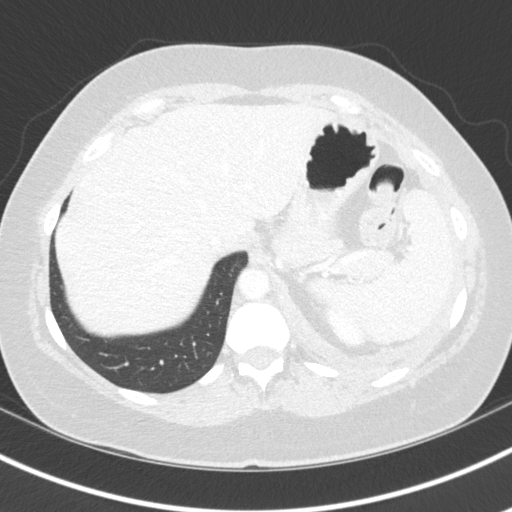
[im 44/147  lung]
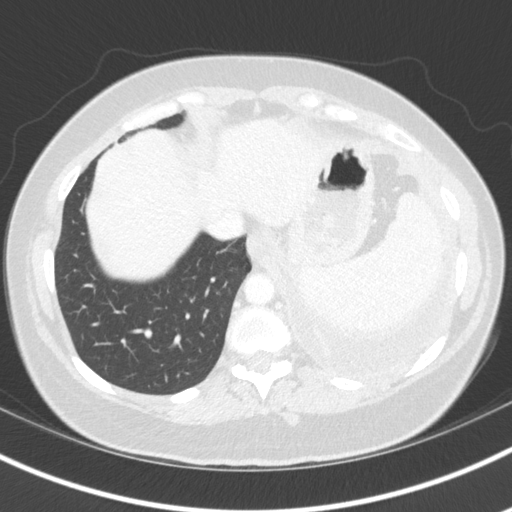
[im 55/147  mediastinal]
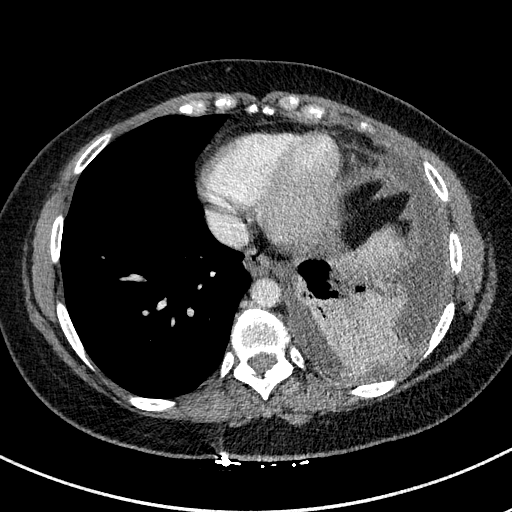
[im 55/147  lung]
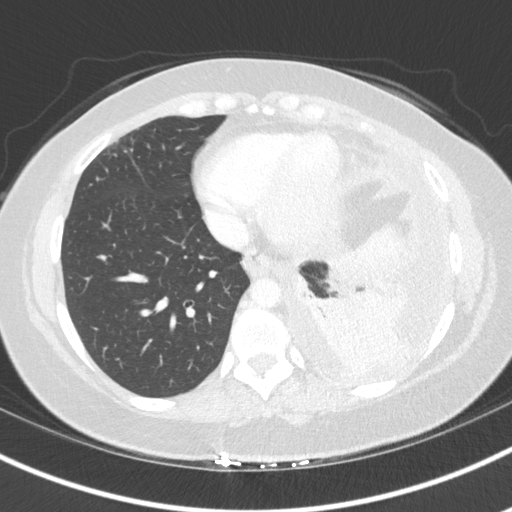
[im 65/147  lung]
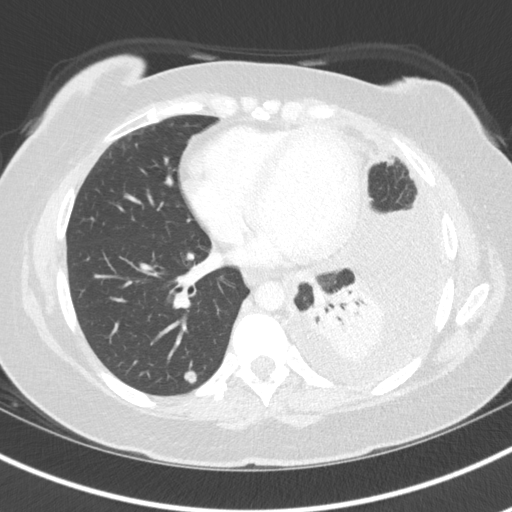
[im 82/147  lung]
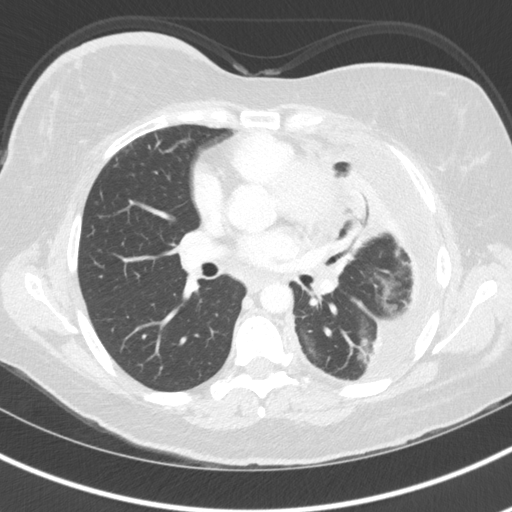
[im 92/147  lung]
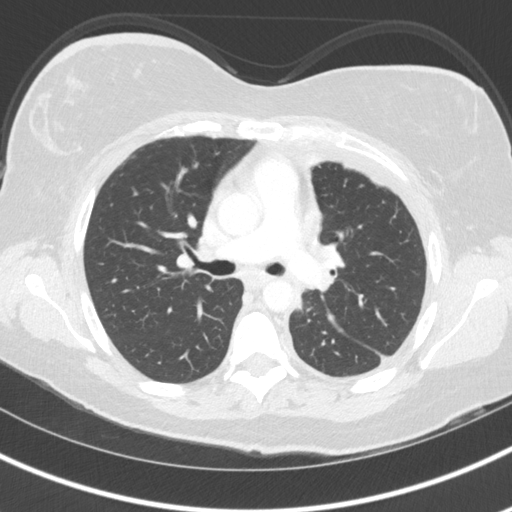
[im 103/147  mediastinal]
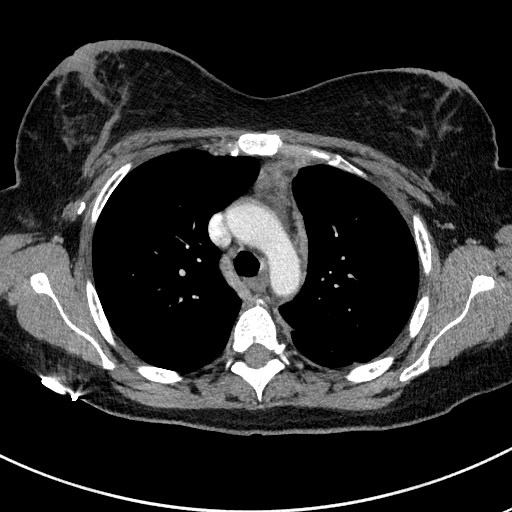
[im 103/147  lung]
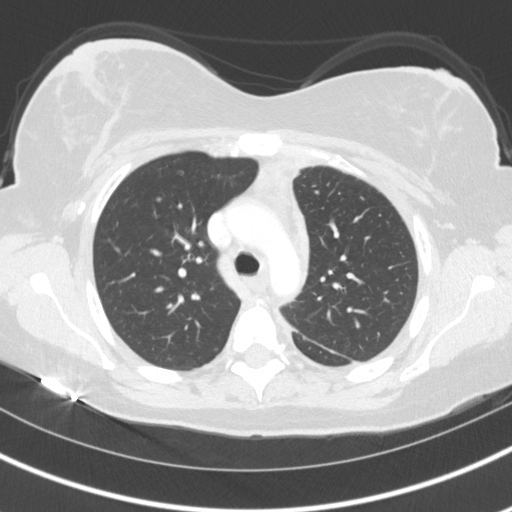
[im 114/147  lung]
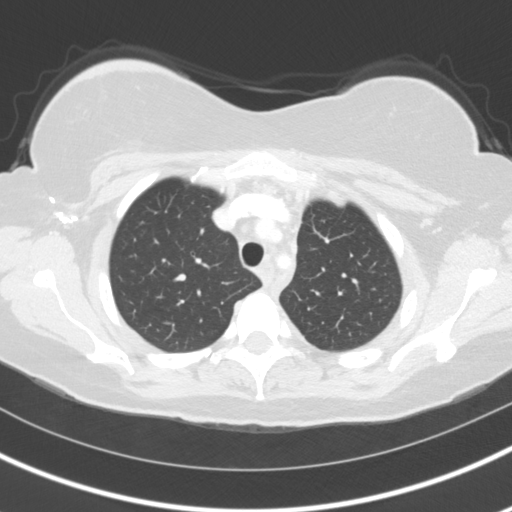
[im 125/147  lung]
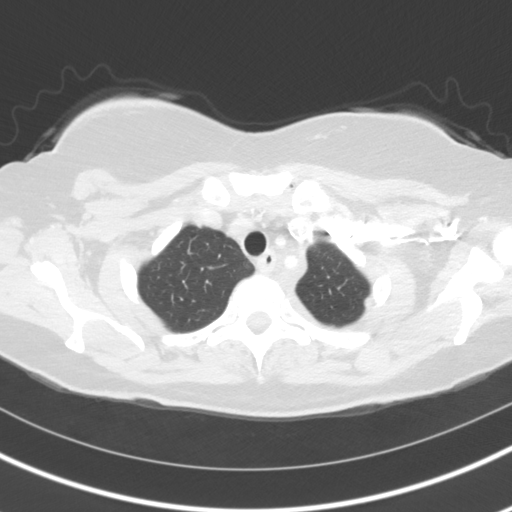
[im 136/147  lung]
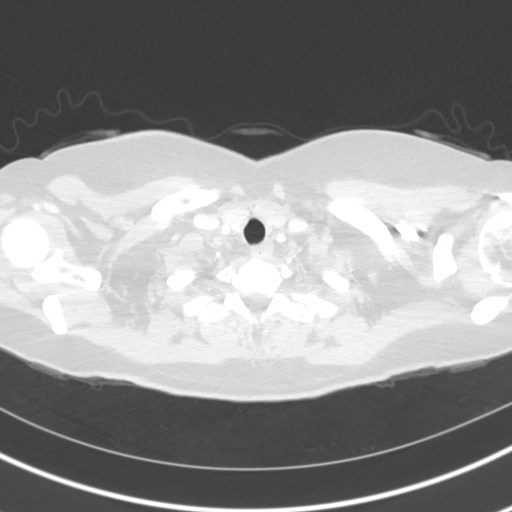

[Series 5: coronal · coronal · 0.57mm/px · 3 of 141 slices shown]
[im 29/141  lung]
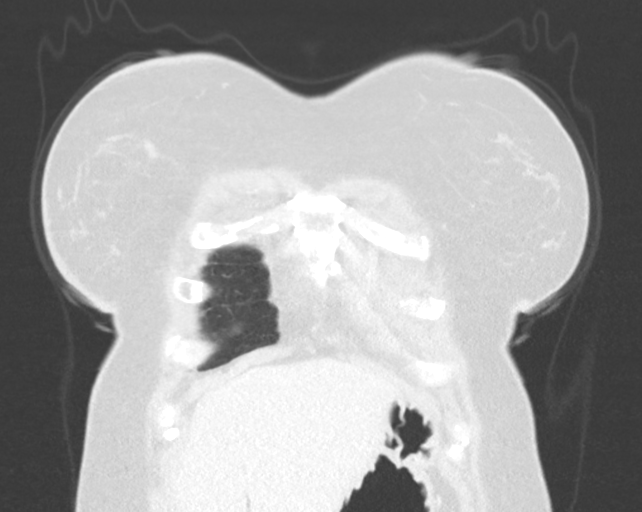
[im 57/141  lung]
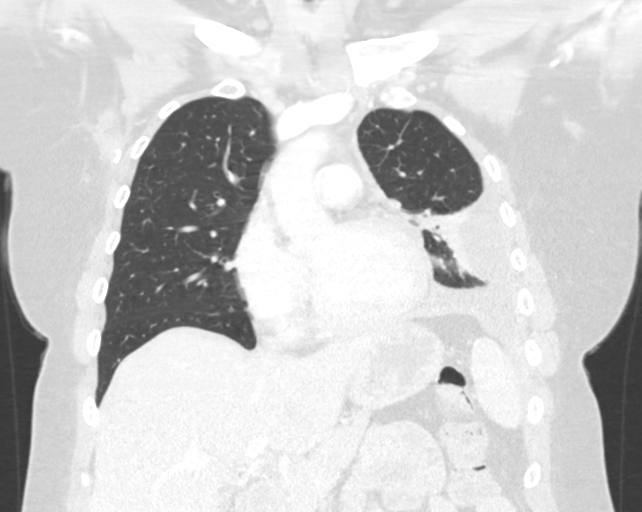
[im 85/141  lung]
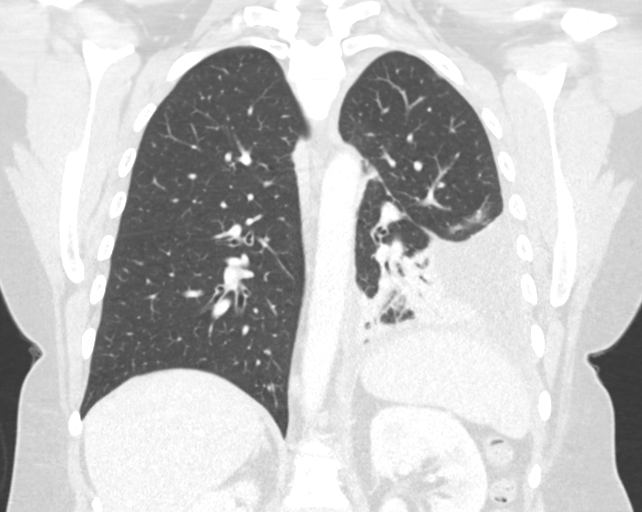

[15 of 36 positions shown; findings below may reference images not displayed]

FINDINGS: Cardiovascular:  No acute findings.

Mediastinum/Nodes: No pathologically enlarged lymph nodes
identified. Ill-defined soft tissue density in the anterior
mediastinum has a triangular shape, and is most consistent with
thymic hyperplasia. Surgical clips are noted in the right axilla,
however there is no evidence of axillary lymphadenopathy.

Lungs/Pleura: A moderate multiloculated left pleural effusion is
seen. Mild pleural thickening and enhancement is seen throughout the
left pleural space, suspicious for a malignant effusion. Compressive
atelectasis is seen in the left mid and lower lung which limits
evaluation, however no definite left lung nodules or masses are
identified.

A 7 mm smoothly large dated pulmonary nodule is seen in the
posterior right lower lobe on image 84/3. Two other tiny sub pleural
nodules are seen in the lateral right upper lobe and anterior right
middle lobe measuring up to 4 mm. No evidence of right-sided pleural
effusion.

Upper Abdomen:  Unremarkable.

Musculoskeletal:  No suspicious bone lesions.
IMPRESSION: Moderate multiloculated left pleural effusion with mild pleural
thickening and enhancement, suspicious for malignant pleural
effusion.

Several right lung nodules, largest measuring 7 mm in right lower
lobe. Pulmonary metastases cannot be excluded.

Probable thymic hyperplasia in anterior mediastinum.

## 2020-07-03 MED ORDER — IOHEXOL 300 MG/ML  SOLN
80.0000 mL | Freq: Once | INTRAMUSCULAR | Status: AC | PRN
  Administered 2020-07-03: 75 mL via INTRAVENOUS

## 2020-07-03 NOTE — Procedures (Signed)
Interventional Radiology Procedure:   Indications: Left pleural effusion.  Procedure: US guided thoracentesis  Findings: Removed 700 ml from left chest.  Complications: None     EBL: Less than 10 ml  Plan: Follow up Chest CT   Melecio Cueto R. Anselm Pancoast, MD  Pager: [phone]

## 2020-07-04 ENCOUNTER — Other Ambulatory Visit: Payer: Self-pay | Admitting: Pulmonary Disease

## 2020-07-04 DIAGNOSIS — J90 Pleural effusion, not elsewhere classified: Secondary | ICD-10-CM

## 2020-07-04 LAB — ACID FAST SMEAR (AFB, MYCOBACTERIA): Acid Fast Smear: NEGATIVE

## 2020-07-05 DIAGNOSIS — C7800 Secondary malignant neoplasm of unspecified lung: Secondary | ICD-10-CM

## 2020-07-05 HISTORY — DX: Secondary malignant neoplasm of unspecified lung: C78.00

## 2020-07-05 LAB — CYTOLOGY - NON PAP

## 2020-07-06 LAB — CHOLESTEROL, BODY FLUID: Cholesterol, Fluid: 75 mg/dL

## 2020-07-07 LAB — BODY FLUID CULTURE W GRAM STAIN: Culture: NO GROWTH

## 2020-07-09 ENCOUNTER — Telehealth: Payer: Self-pay | Admitting: Pulmonary Disease

## 2020-07-09 NOTE — Telephone Encounter (Signed)
Per LG verbally- schedule patient for 07/12/2020 at 12:00.  Lm for patient.

## 2020-07-09 NOTE — Telephone Encounter (Signed)
Appt scheduled 07/16/2020 at 10:30. Patient is aware and voiced her understanding.  Nothing further needed at this time.

## 2020-07-11 ENCOUNTER — Ambulatory Visit: Payer: BC Managed Care – PPO | Admitting: Dermatology

## 2020-07-16 ENCOUNTER — Ambulatory Visit: Payer: BC Managed Care – PPO | Admitting: Pulmonary Disease

## 2020-07-16 ENCOUNTER — Encounter: Payer: Self-pay | Admitting: Pulmonary Disease

## 2020-07-16 ENCOUNTER — Other Ambulatory Visit: Payer: Self-pay

## 2020-07-16 VITALS — BP 128/76 | HR 116 | Temp 98.0°F | Ht 65.0 in | Wt 169.4 lb

## 2020-07-16 DIAGNOSIS — Z170 Estrogen receptor positive status [ER+]: Secondary | ICD-10-CM

## 2020-07-16 DIAGNOSIS — J90 Pleural effusion, not elsewhere classified: Secondary | ICD-10-CM

## 2020-07-16 DIAGNOSIS — R911 Solitary pulmonary nodule: Secondary | ICD-10-CM | POA: Diagnosis not present

## 2020-07-16 DIAGNOSIS — C50411 Malignant neoplasm of upper-outer quadrant of right female breast: Secondary | ICD-10-CM | POA: Diagnosis not present

## 2020-07-16 NOTE — Telephone Encounter (Signed)
Usually scars from radiation are more linear meaning more like lines and tend to coalesce.  They do not usually look like discrete nodules.  However there can be inflammatory nodules that occurred during infection or other types of inflammation.  We discussed this during the visit.  The PET/CT should help [place] clarify some of these issues.  I will review when this is available.

## 2020-07-16 NOTE — Telephone Encounter (Signed)
Dr. Gonzalez, please advise. Thanks 

## 2020-07-16 NOTE — Progress Notes (Signed)
Subjective:    Patient ID: Kaylee Romero, female    DOB: 01-20-1980, 40 y.o.   MRN: [id number] Chief Complaint  Patient presents with   pulmonary consult    Recent CT-- prod cough with clear sputum and sob with exertion.   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  She is referred for potential robotic bronchoscopy for this nodule.  She is kindly referred by Dr. Claudette Stapler.  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  She underwent right lumpectomy and right axillary sentinel lymph node sampling in November 2020 with PT2PN1 stage IIa invasive ductal carcinoma, grade 2, with negative margins noted.  She underwent chemotherapy between December 2020 through February 2021.  She underwent adjuvant radiation completed in August 2021.  She has been on tamoxifen since completing her therapy.  From this standpoint she had been doing well until 24 June when she developed acute shortness of breath and fatigue.  She was noted to have a left pleural effusion.  She was in [facility] at that time and was admitted to [facility].  She was noted to have a left pleural effusion and had a thoracentesis which yielded over 1 L of fluid according to her.  She also has noted to have multiple pulmonary nodules.  She noted low-grade fever at that time.  She also had leukocytosis.  Films are not available for review from that initial encounter.  She saw her primary care physician on 27 June and was treated with 10 days of Levaquin.  He was noted to have recurrent effusion at that time.  She was then referred to Dr. Claudette Stapler for evaluation and was seen on 29 June.  He had a thoracentesis performed by interventional radiology that day and she also had a chest CT after the thoracentesis.  There was  a moderate left multiloculated left pleural effusion still persisting.  There was compressive atelectasis on the left mid and lower lung fields, infiltrate cannot be excluded.  There was a right lung nodule measuring 7 mm on the right lower lobe and smaller 4 mm nodules on the right.  I have reviewed this film with the patient.  There is no higher film for comparison.  The patient is anxious and tearful during evaluation.  She is to have a PET/CT on 14 July.  Since her first thoracentesis she has not had any further issues with fever.  She has not had any chest pain.  She is still somewhat breathless after her initial presentation with shortness of breath.  She is concerned of course, that the CT findings may represent metastatic disease.  The volume obtained during the second thoracentesis was 700 mL.  Cell  count from that thoracentesis showed that the effusion was lymphocytic predominant with 986 cells present.  Cytology showed lymphocyte predominant effusion with no evidence of metastatic carcinoma. LDH from the pleural fluid was 408, glucose was 92 and was 4.3.  Pleural fluid cultures have been negative.  She has not had any weight loss or anorexia.  She does not endorse any other symptomatology.  Review of Systems A 10 point review of systems was performed and it is as noted above otherwise negative.  Past Medical History:  Diagnosis Date   Anxiety    Family  history of breast cancer    HSV (herpes simplex virus) anogenital infection    positive titer only   Past Surgical History:  Procedure Laterality Date   BREAST LUMPECTOMY WITH AXILLARY LYMPH NODE BIOPSY Right 11/21/2018   Procedure: RIGHT BREAST LUMPECTOMY WITH SENTINEL LYMPH NODE BIOPSY;  Surgeon: Jovita Kussmaul, MD;  Location: [place];  Service: General;  Laterality: Right;   BREAST REDUCTION SURGERY Bilateral 11/21/2018   Procedure: BILATERAL MAMMARY REDUCTION  (BREAST);  Surgeon: Wallace Going, DO;  Location: [place];  Service:  Plastics;  Laterality: Bilateral;   PORT-A-CATH REMOVAL N/A 07/06/2019   Procedure: REMOVAL PORT-A-CATH;  Surgeon: Jovita Kussmaul, MD;  Location: [place];  Service: General;  Laterality: N/A;   PORTACATH PLACEMENT N/A 11/21/2018   Procedure: INSERTION LEFT PORT-A-CATH WITH ULTRASOUND GUIDANCE;  Surgeon: Jovita Kussmaul, MD;  Location: [place];  Service: General;  Laterality: N/A;   TONSILLECTOMY  1987   TONSILLECTOMY     WISDOM TOOTH EXTRACTION     Family History  Problem Relation Age of Onset   Hypertension Father    Alcohol abuse Paternal Grandfather    Heart disease Paternal Grandfather    Alcohol abuse Maternal Grandfather    Heart disease Maternal Grandmother    Breast cancer Other    Social History   Tobacco Use   Smoking status: Former    Packs/day: 0.50    Years: 20.00    Pack years: 10.00    Types: Cigarettes    Start date: 01/13/2002    Quit date: 10/17/2016    Years since quitting: 3.7   Smokeless tobacco: Never  Substance Use Topics   Alcohol use: Yes    Comment: Socially   Allergies  Allergen Reactions   Betadine [Povidone Iodine] Rash   Current Meds  Medication Sig   amphetamine-dextroamphetamine (ADDERALL XR) 20 MG 24 hr capsule Take 20 mg by mouth daily.   Ascorbic Acid (VITAMIN C) 1000 MG tablet Take 1,000 mg by mouth daily.   hydrOXYzine (ATARAX/VISTARIL) 10 MG tablet Take 10 mg by mouth at bedtime.    Multiple Vitamin (MULTIVITAMIN WITH MINERALS) TABS tablet Take 1 tablet by mouth daily.   naproxen sodium (ALEVE) 220 MG tablet Take 1 tablet (220 mg total) by mouth 3 (three) times daily with meals.   tacrolimus (PROTOPIC) 0.1 % ointment Apply topically 2 (two) times daily. Apply to affected areas for granuloma annulare   tamoxifen (NOLVADEX) 20 MG tablet Take 20 mg by mouth daily.   Tetrahydrozoline HCl (VISINE OP) Place 1 drop into both eyes daily as needed (redness).   venlafaxine XR (EFFEXOR-XR) 75 MG 24 hr capsule TAKE 1 CAPSULE BY  MOUTH EVERY DAY   vitamin B-12 (CYANOCOBALAMIN) 1000 MCG tablet Take 1,000 mcg by mouth daily.   Immunization History  Administered Date(s) Administered   Influenza, Quadrivalent, Recombinant, Inj, Pf 10/08/2018   Influenza-Unspecified 10/20/2019   Moderna Sars-Covid-2 Vaccination 03/11/2019, 04/08/2019, 09/30/2019      Objective:   Physical Exam BP 128/76 (BP Location: Left Arm, Cuff Size: Normal)   Pulse (!) 116   Temp 98 F (36.7 C) (Temporal)   Ht $R'5\' 5"'Pf$  (1.651 m)   Wt 169 lb 6.4 oz (76.8 kg)   SpO2 97%   BMI 28.19 kg/m  GENERAL: Well-developed, overweight woman, anxious and tearful at times.  No respiratory distress.  Fully ambulatory. HEAD: Normocephalic, atraumatic.  EYES: Pupils equal, round, reactive to light.  No scleral icterus.  MOUTH: Nose/mouth/throat not  examined due to masking requirements for COVID 19. NECK: Supple. No thyromegaly. Trachea midline. No JVD.  No adenopathy. PULMONARY: Good air entry bilaterally.  Slightly diminished breath sounds on left base no other adventitious sounds. CARDIOVASCULAR: S1 and S2.  Tachycardic rate with regular rhythm.  No rubs, murmurs or gallops heard. ABDOMEN: Benign. MUSCULOSKELETAL: No joint deformity, no clubbing, no edema.  NEUROLOGIC: No focal deficit, no gait disturbance, speech is fluent. SKIN: Intact,warm,dry.  No rashes. PSYCH: Anxious, tearful.  Representative image from 29 June CT chest showing 7 mm nodule on the right:    Representative slice of tissue windows from 29 June CT chest showing loculated pleural effusion:      Assessment & Plan:     ICD-10-CM   1. Nodule of lower lobe of right lung  R91.1    Patient is to have PET/CT on 14 July Will review results of PET/CT Nodule is 7 mm may skew yield even with robotic bronchoscopy Will review PET/CT     2. Pleural effusion on left  J90    PET/CT will be of help in the situation Patient ultimately may need referral to thoracic surgery    3. Malignant  neoplasm of upper-outer quadrant of right breast in female, estrogen receptor positive ([place])  C50.411    Z17.0    This issue adds complexity to her management     I have reviewed the films with the patient.  The upcoming PET/CT will be important to review.  We will reconvene on Tuesday 19 July to discuss further recommendations.  I have assured the patient that I will let her know the findings of the PET/CT once available.  Further plans and recommendations pending PET/CT results.   Renold Don, MD Advanced Bronchoscopy  PCCM   *This note was dictated using voice recognition software/Dragon.  Despite best efforts to proofread, errors can occur which can change the meaning.  Any change was purely unintentional.

## 2020-07-16 NOTE — Patient Instructions (Signed)
I will reviewed your PET/CT and call you no later than Friday with the results  We will see you in follow-up on 19 July at 12 PM

## 2020-07-18 ENCOUNTER — Ambulatory Visit
Admission: RE | Admit: 2020-07-18 | Discharge: 2020-07-18 | Disposition: A | Discharge status: Home / Self Care | Payer: BC Managed Care – PPO | Source: Ambulatory Visit | Attending: Pulmonary Disease | Admitting: Pulmonary Disease

## 2020-07-18 ENCOUNTER — Other Ambulatory Visit: Payer: Self-pay | Admitting: Pulmonary Disease

## 2020-07-18 ENCOUNTER — Other Ambulatory Visit: Payer: Self-pay

## 2020-07-18 ENCOUNTER — Other Ambulatory Visit: Payer: Self-pay | Admitting: Oncology

## 2020-07-18 DIAGNOSIS — C50411 Malignant neoplasm of upper-outer quadrant of right female breast: Secondary | ICD-10-CM

## 2020-07-18 DIAGNOSIS — Z9889 Other specified postprocedural states: Secondary | ICD-10-CM | POA: Diagnosis not present

## 2020-07-18 DIAGNOSIS — J90 Pleural effusion, not elsewhere classified: Secondary | ICD-10-CM | POA: Insufficient documentation

## 2020-07-18 DIAGNOSIS — Z853 Personal history of malignant neoplasm of breast: Secondary | ICD-10-CM | POA: Insufficient documentation

## 2020-07-18 LAB — GLUCOSE, CAPILLARY: Glucose-Capillary: 84 mg/dL (ref 70–99)

## 2020-07-18 IMAGING — CT NM PET TUM IMG INITIAL (PI) SKULL BASE T - THIGH
1 of 11 series · 1 of 25 positions shown · non-contrast
Comparison: Chest CT [DATE].

CLINICAL DATA: Initial treatment strategy for loculated effusion
and nodularity of the pleural surface in a patient with history of
breast cancer.

EXAM:
NUCLEAR MEDICINE PET SKULL BASE TO THIGH
TECHNIQUE: 8.8 mCi F-18 FDG was injected intravenously. Full-ring PET imaging
was performed from the skull base to thigh after the radiotracer. CT
data was obtained and used for attenuation correction and anatomic
localization.
Fasting blood glucose: 84 mg/dl

[Series 3: ct wb 5.0 b30f · axial · 5.0mm · 0.98mm/px · 1 of 290 slices shown]
[im 290/290  brain]
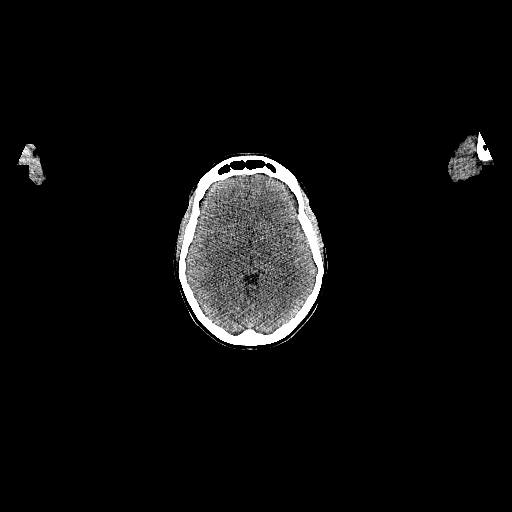

[1 of 25 positions shown; findings below may reference images not displayed]

FINDINGS: Mediastinal blood pool activity: SUV max

Liver activity: SUV max NA

NECK: No hypermetabolic lymph nodes in the neck.

Incidental CT findings: none

CHEST: Circumferential pleural nodularity associated with the
loculated pleural effusion that was seen on previous chest imaging.
This involves the entire circumference of the pleura and shows a
maximum SUV in the range of 9.5 posteriorly along the inferior chest
and [DATE] anteriorly along the inferior chest measured on image 123
of series 3. Circumferential pleural thickening is more pronounced
in the costodiaphragmatic sulci better demonstrated on the
contrasted CT.

Nodularity extends along the LEFT mediastinal border (image 86/3).
Measures up to 7 mm greatest thickness and shows a maximum SUV in
the range of

LEFT retropectoral lymph node (image 215/6, 76/3 with a maximum SUV
of 2.6, this measures less than a cm and there are small
retropectoral lymph nodes on the LEFT and a small LEFT
supraclavicular lymph node as well on image 66/3 that measures 7 mm
showing a maximum SUV of

Signs of RIGHT axillary dissection with a maximum SUV of 1.8 and
some soft tissue surrounding surgical clips in this area measuring
1.8 x 1.1 cm (image 85/3)

RIGHT retrocrural lymph node (image 130/3) maximum SUV of 5.3 5 mm.
No discrete mediastinal adenopathy. Some uptake about the LEFT hilum
is contiguous with pleural thickening.

6 mm pulmonary nodule in the RIGHT upper lobe (image 89/3) this is
not associated with increased FDG uptake.

Well-circumscribed RIGHT lower lobe pulmonary nodule (image 110/3) 7
mm maximum SUV of

Fissural nodularity in the LEFT mid chest (image 93/3) 11 mm with an
X min SUV of

Incidental CT findings: Normal caliber of the thoracic aorta. Normal
heart size. Pericardial thickening and thickening along the LEFT
mediastinal border. Is normal caliber of the central pulmonary
vessels. Limited assessment of cardiovascular structures given lack
of intravenous contrast.

Loculated LEFT effusion of similar volume to previous imaging.

ABDOMEN/PELVIS: Heterogeneous uptake in the liver without discrete
CT correlate. More focal area of uptake in hepatic subsegment VIII
is equivocal with a maximum SUV of 3.6 (image 157/6) this is of
uncertain significance given the heterogeneity seen with respect to
hepatic uptake in general. No signs of visceral or nodal disease in
the abdomen

Incidental CT findings: No acute findings relative liver,
gallbladder, spleen, pancreas, adrenal glands or kidneys. The
urinary bladder is collapsed. No signs of bowel obstruction.
Appendix is normal. IUD in place. No intra-abdominal ascites.

SKELETON: No focal hypermetabolic activity to suggest skeletal
metastasis.

Incidental CT findings: none
IMPRESSION: Signs of circumferential pleural disease most likely related to
metastatic disease from breast cancer given patient history. This
pattern can also be seen in the setting of mesothelioma and is
associated with a loculated LEFT pleural effusion.

Retrocrural lymph node with definite involvement.

Nodules in the RIGHT chest without increased metabolic activity
dominant nodule in the RIGHT lower lobe. Suspicious given other
findings, attention on follow-up.

Equivocal retropectoral and supraclavicular lymph nodes, attention
on follow-up.

Heterogeneous uptake pattern in the liver. Slightly more pronounced
area in the posterior RIGHT hemi liver is equivocal. Could consider
hepatic MRI for further assessment with and without contrast. Eovist
contrast is often helpful in this setting.

## 2020-07-18 MED ORDER — FLUDEOXYGLUCOSE F - 18 (FDG) INJECTION
8.8000 | Freq: Once | INTRAVENOUS | Status: AC | PRN
  Administered 2020-07-18: 8.8 via INTRAVENOUS

## 2020-07-18 NOTE — Progress Notes (Signed)
Entered in error

## 2020-07-18 NOTE — Progress Notes (Signed)
Patient's PET/CT was performed today.  The nodule in question has very low uptake.  The more pressing issue is the recurrent left pleural effusion with diffuse enhancement of the pleura and significant lung atelectasis.  Patient will need surgical intervention for proper diagnosis/management of this issue.  I have spoken to the patient and she is in agreement to be seen by thoracic surgery in [place].  She has been referred to Dr. Melodie Bouillon for evaluation and management of this issue.  This is particularly important given her history of breast cancer.    ICD-10-CM   1. Recurrent pleural effusion on left  J90     2. Malignant neoplasm of upper-outer quadrant of right breast in female, estrogen receptor positive ([facility])  C50.411    Z17.0

## 2020-07-19 ENCOUNTER — Other Ambulatory Visit: Payer: Self-pay | Admitting: Interventional Radiology

## 2020-07-19 ENCOUNTER — Ambulatory Visit
Admission: RE | Admit: 2020-07-19 | Discharge: 2020-07-19 | Disposition: A | Discharge status: Home / Self Care | Payer: BC Managed Care – PPO | Source: Ambulatory Visit | Attending: Pulmonary Disease | Admitting: Pulmonary Disease

## 2020-07-19 ENCOUNTER — Ambulatory Visit
Admission: RE | Admit: 2020-07-19 | Discharge: 2020-07-19 | Disposition: A | Discharge status: Home / Self Care | Payer: BC Managed Care – PPO | Source: Ambulatory Visit | Attending: Interventional Radiology | Admitting: Interventional Radiology

## 2020-07-19 ENCOUNTER — Telehealth: Payer: Self-pay | Admitting: Pulmonary Disease

## 2020-07-19 DIAGNOSIS — Z9889 Other specified postprocedural states: Secondary | ICD-10-CM

## 2020-07-19 DIAGNOSIS — J90 Pleural effusion, not elsewhere classified: Secondary | ICD-10-CM

## 2020-07-19 IMAGING — DX DG CHEST 1V PORT
1 series · 1 of 1 positions shown · non-contrast
Comparison: [DATE]; chest CT-[DATE]; PET-CT-[DATE]

CLINICAL DATA: Post left-sided thoracentesis.

EXAM:
PORTABLE CHEST 1 VIEW

[chest ap]
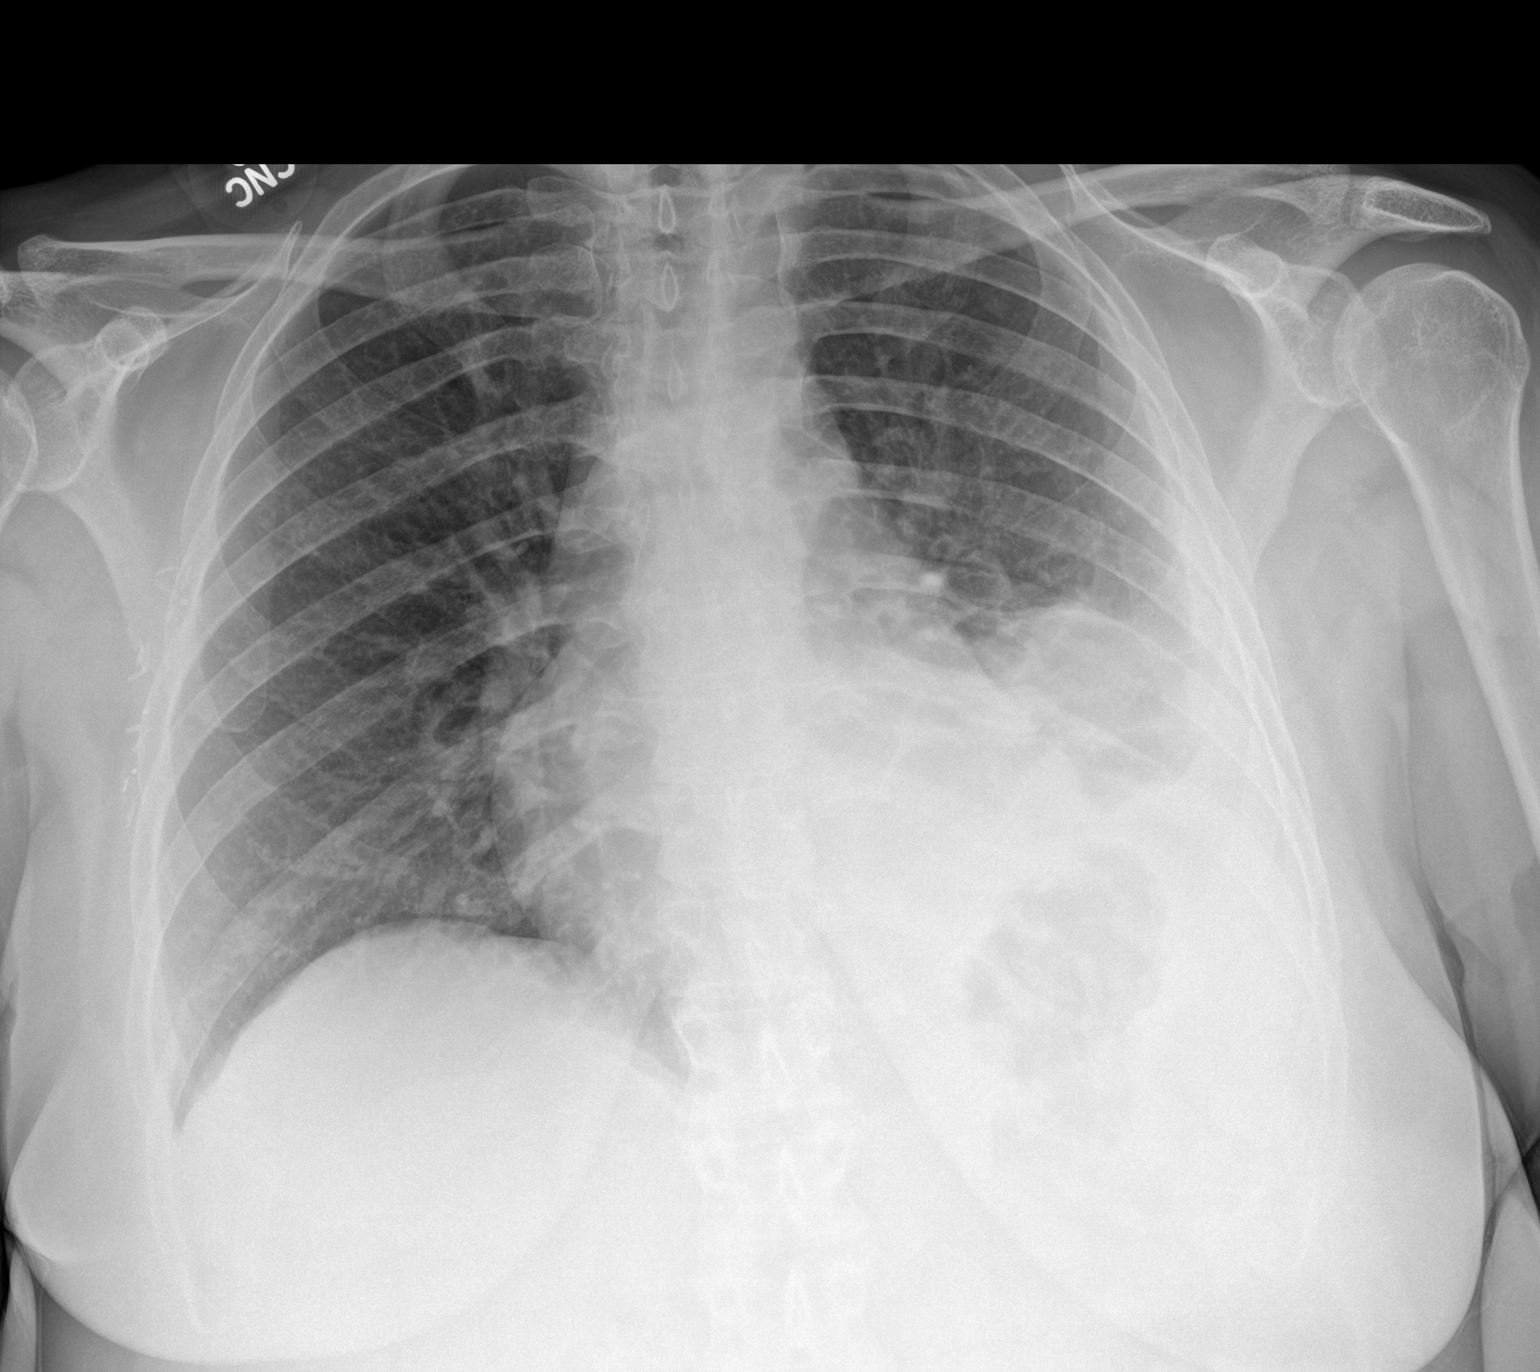

[1 of 1 positions shown; findings below may reference images not displayed]

FINDINGS: Grossly unchanged cardiac silhouette and mediastinal contours.
Interval reduction in persistent moderate-sized partially loculated
left-sided effusion post thoracentesis. No pneumothorax. Residual
left basilar heterogeneous/consolidative opacities, unchanged. The
right hemithorax remains well aerated. No new focal airspace
opacities. No evidence of edema. No acute osseous abnormalities.
Surgical clips overlie the upper outer quadrant of the right breast.
IMPRESSION: 1. Slight reduction in persistent moderate sized partially loculated
left-sided effusion post thoracentesis. No pneumothorax.
2. Unchanged left basilar heterogeneous/consolidative opacities,
likely atelectasis.

## 2020-07-19 IMAGING — US US THORACENTESIS ASP PLEURAL SPACE W/IMG GUIDE
1 series · 5 of 5 positions shown · non-contrast
Comparison: PET-CT-[DATE];

INDICATION: Recurrent symptomatic left-sided pleural effusion. Please perform
ultrasound-guided thoracentesis for diagnostic and therapeutic
purposes.

EXAM:
US THORACENTESIS ASP PLEURAL SPACE W/IMG GUIDE
TECHNIQUE: Informed written consent was obtained from the patient after a
discussion of the risks, benefits and alternatives to treatment. A
timeout was performed prior to the initiation of the procedure.

[Series 1: us thoracentesis asp pleural space w/img guide · 0.25mm/px · 5 of 5 slices shown]
[im 1/5]
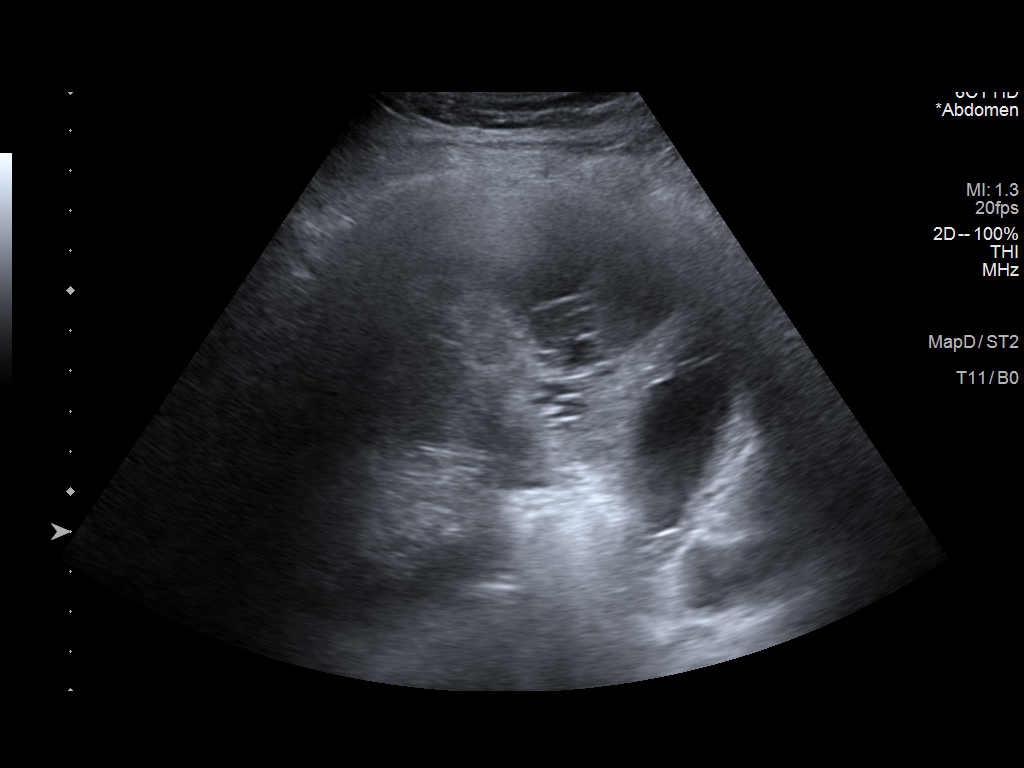
[im 2/5]
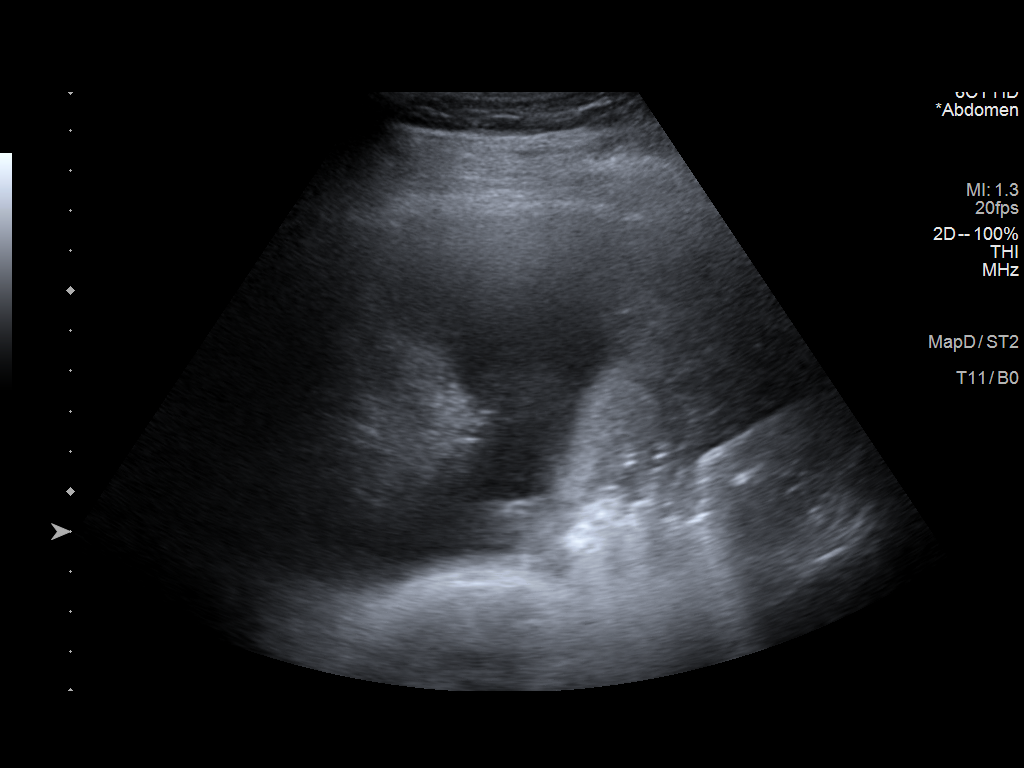
[im 3/5]
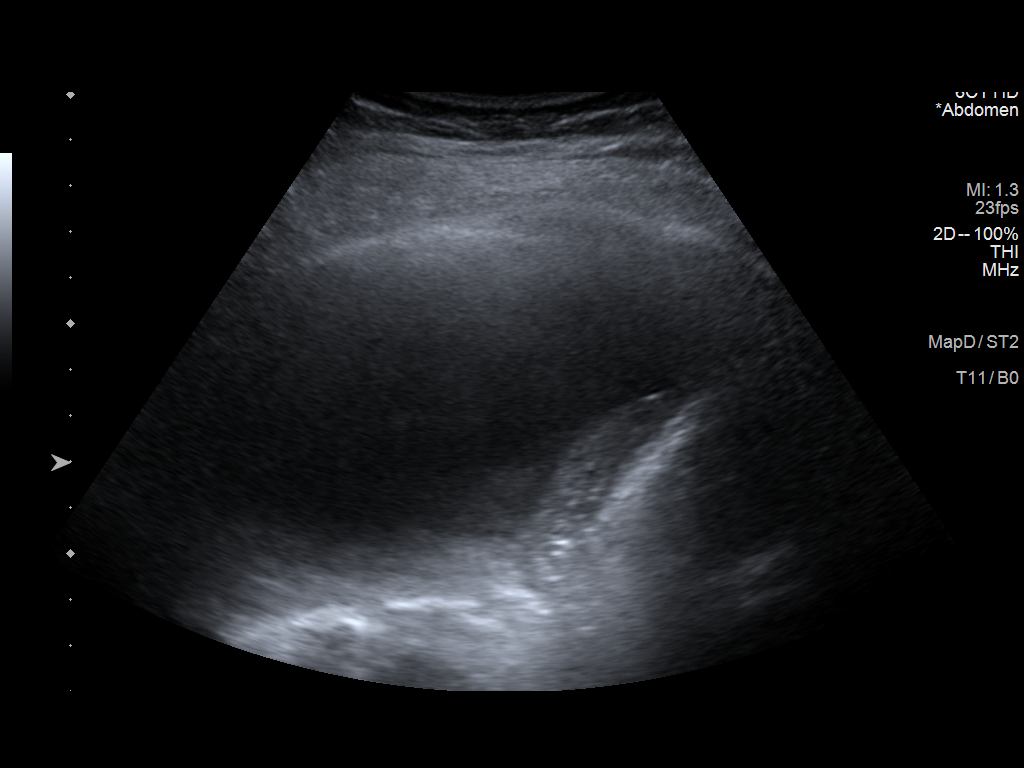
[im 4/5]
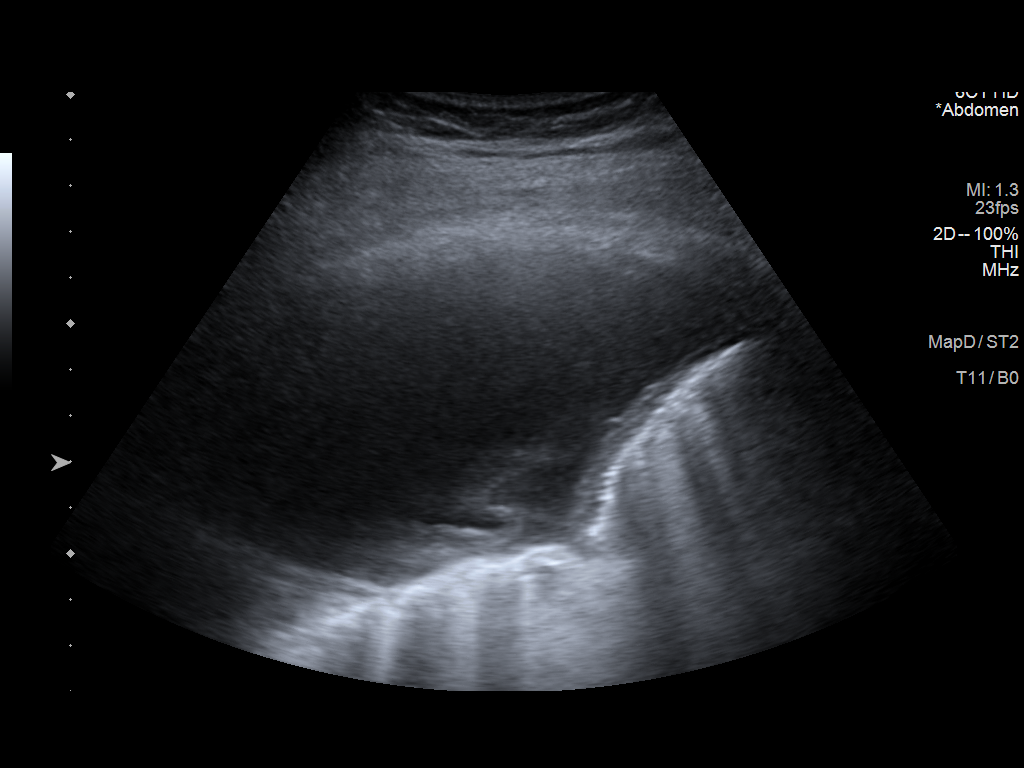
[im 5/5]
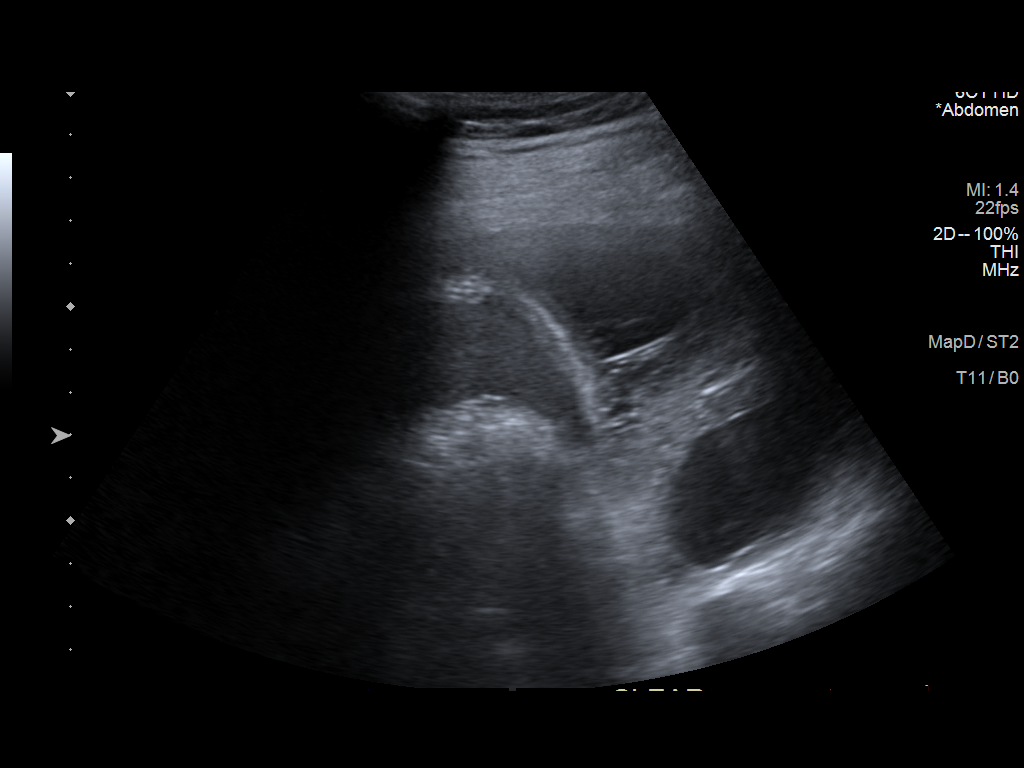

[5 of 5 positions shown; findings below may reference images not displayed]

chest CT-[DATE];
ultrasound-guided left-sided thoracentesis-[DATE] (yielding 700
cc of pleural fluid).

MEDICATIONS:
None.

COMPLICATIONS:
None immediate.
Initial ultrasound scanning demonstrates a moderate sized recurrent
left-sided pleural effusion the basilar component of which is noted
to be extensively loculated. An adequate percutaneous window was
identified at the level of the left mid lateral chest and the
procedure was planned. The chest was prepped and draped in the usual
sterile fashion. 1% lidocaine was used for local anesthesia.

An ultrasound image was saved for documentation purposes. An 8 Fr
Safe-T-Centesis catheter was introduced. The thoracentesis was
performed. The catheter was removed and a dressing was applied. The
patient tolerated the procedure well without immediate post
procedural complication. The patient was escorted to have an upright
chest radiograph.
FINDINGS: A total of approximately 275 cc of serous fluid was removed.
Requested samples were sent to the laboratory.
IMPRESSION: Successful ultrasound-guided left sided thoracentesis yielding 275
cc of pleural fluid.

Note, an incomplete thoracentesis was performed secondary to
extensive loculations within the pleural fluid.

## 2020-07-19 NOTE — Telephone Encounter (Signed)
Thoracentesis is scheduled for today at 2:00. Patient is aware and voiced her understanding. Nothing further needed at this time.

## 2020-07-19 NOTE — Telephone Encounter (Signed)
Pt stated that she is having a really hard time breathing and stated that her cough is starting again and stated that she is worried about fluid in her lungs. Stated that she was just wanting to speak with Margie or Dr. Patsey Berthold for assistance.  Pharmacy; [id number] - [address], [address]; [phone]

## 2020-07-19 NOTE — Telephone Encounter (Signed)
So the PET/CT did show that her effusion has recurred.  We can have IR tap it today if possible.  With fluid sent to send for cytology only.

## 2020-07-19 NOTE — Telephone Encounter (Signed)
Spoke to patient, who reports of chest congestion, prod cough with clear sputum, increased sob with exertion and at rest and decreased energy. She is concerned about pleural effusion.  Denied fever or chills. She has has sweats but this is her baseline.   Appt with Dr. Kipp Brood on 07/23/2020.  Dr. Patsey Berthold, please advise. Thanks

## 2020-07-19 NOTE — Procedures (Signed)
Pre procedural Dx: Symptomatic pleural effusion Post procedural Dx: Same  Successful US guided left sided thoracentesis yielding 275 cc of serous pleural fluid.   Samples sent to lab for analysis.  Note, an incomplete thoracentesis was performed secondary to extensive loculations within the pleural fluid.   EBL: None Complications: None immediate.  Ronny Bacon, MD Pager #: [phone]

## 2020-07-22 ENCOUNTER — Encounter: Payer: Self-pay | Admitting: Thoracic Surgery (Cardiothoracic Vascular Surgery)

## 2020-07-22 ENCOUNTER — Other Ambulatory Visit: Payer: Self-pay | Admitting: Thoracic Surgery (Cardiothoracic Vascular Surgery)

## 2020-07-22 ENCOUNTER — Institutional Professional Consult (permissible substitution): Payer: BC Managed Care – PPO | Admitting: Thoracic Surgery (Cardiothoracic Vascular Surgery)

## 2020-07-22 ENCOUNTER — Ambulatory Visit
Admission: RE | Admit: 2020-07-22 | Discharge: 2020-07-22 | Disposition: A | Discharge status: Home / Self Care | Payer: BC Managed Care – PPO | Source: Ambulatory Visit | Attending: Thoracic Surgery (Cardiothoracic Vascular Surgery) | Admitting: Thoracic Surgery (Cardiothoracic Vascular Surgery)

## 2020-07-22 ENCOUNTER — Other Ambulatory Visit: Payer: Self-pay

## 2020-07-22 VITALS — BP 124/75 | HR 119 | Resp 20 | Ht 65.0 in | Wt 170.0 lb

## 2020-07-22 DIAGNOSIS — J90 Pleural effusion, not elsewhere classified: Secondary | ICD-10-CM

## 2020-07-22 IMAGING — CR DG CHEST 2V
2 series · 2 of 2 positions shown · non-contrast
Comparison: [DATE], PET CT [DATE]

CLINICAL DATA: Pleural effusion

EXAM:
CHEST - 2 VIEW

[w chest pa *]
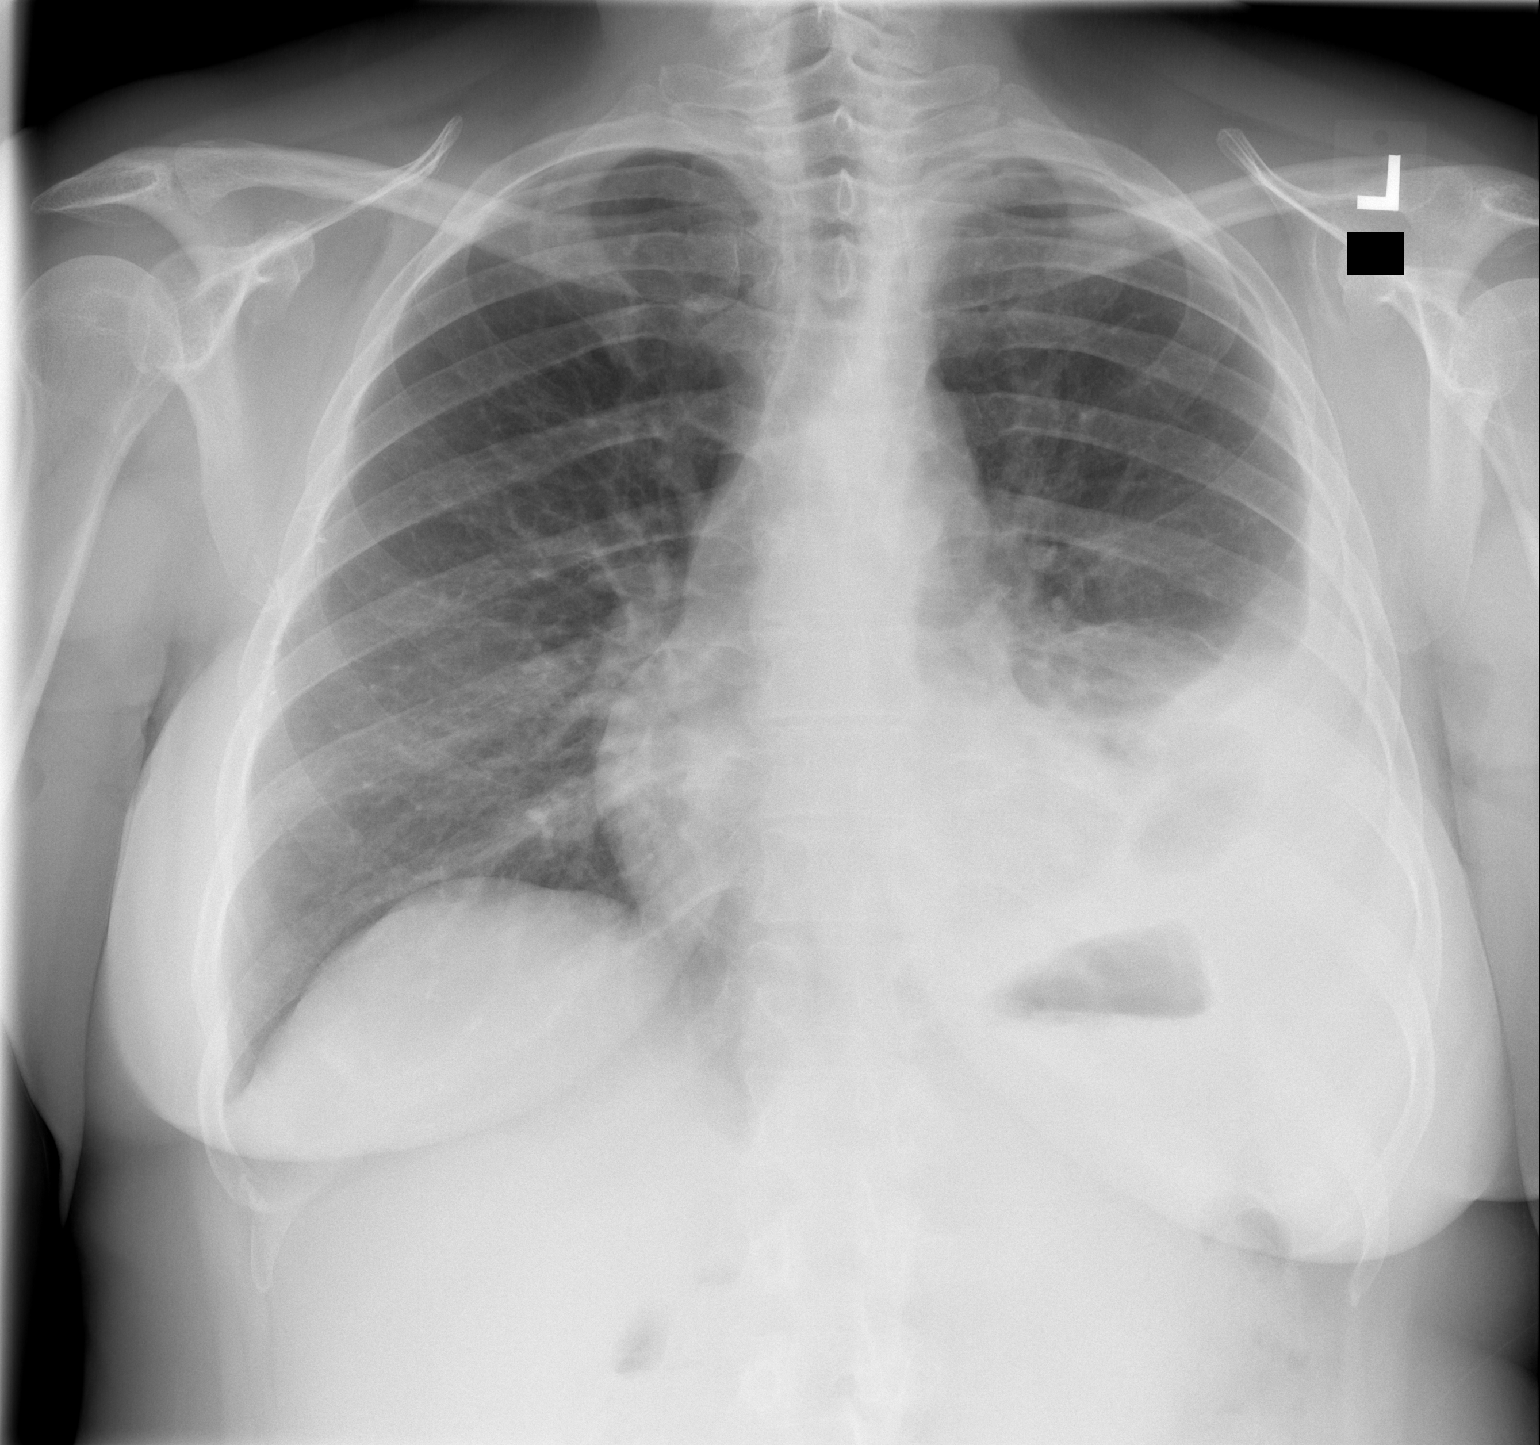

[w chest lat]
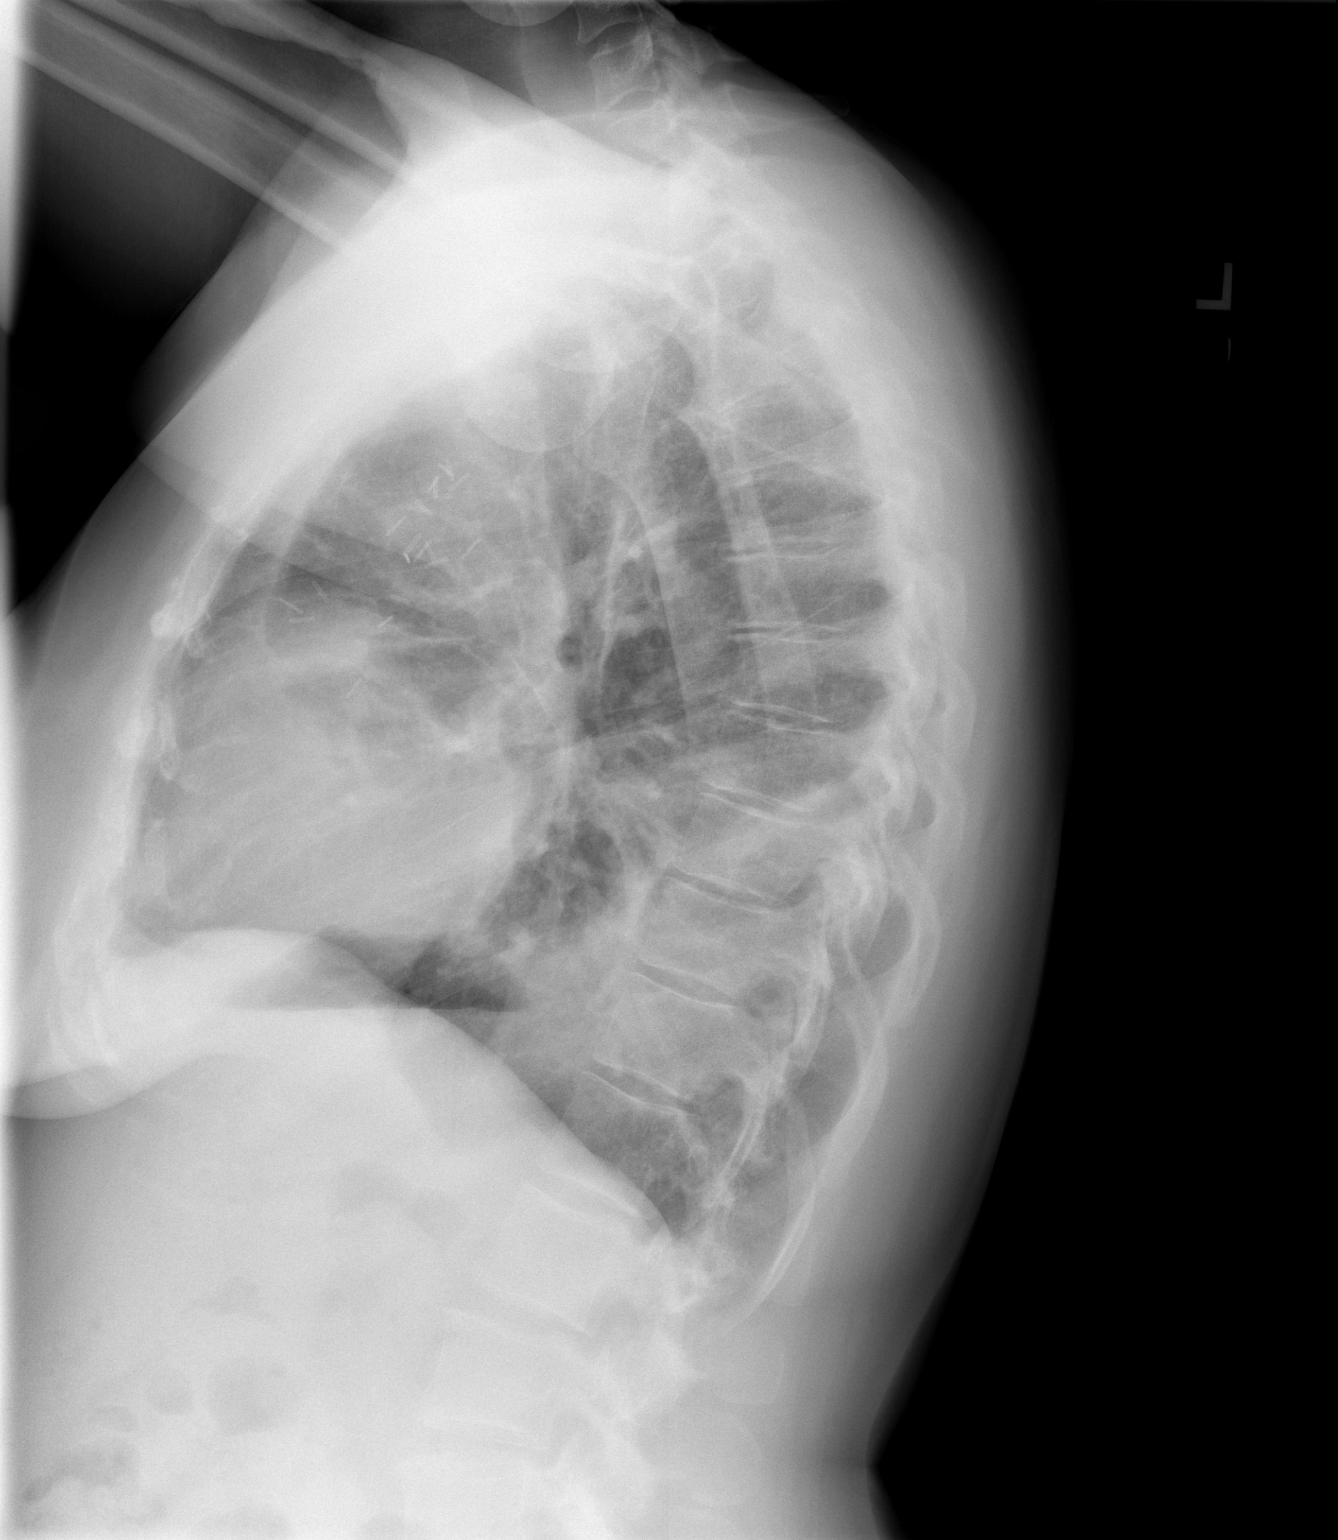

[2 of 2 positions shown; findings below may reference images not displayed]

FINDINGS: Moderate left pleural effusion with probable loculation. Airspace
disease at the left base as before. Normal cardiomediastinal
silhouette. No pneumothorax.
IMPRESSION: No significant change in moderate left pleural effusion with
probable loculation.

## 2020-07-22 NOTE — Progress Notes (Signed)
[address]       [place] [address]             [phone]                    [facility] Medical Record #[id number] Date of Birth: 07/03/1980  Referring: Tyler Pita, MD Primary Care: Dion Body, MD Primary Cardiologist: None  Chief Complaint:    Chief Complaint  Patient presents with   Pleural Effusion    Initial surgical consult, PET 7/14, CT chest 6/29, US thoracentesis 6/29    History of Present Illness:    Kaylee Romero 40 y.o. female referred by Dr. Patsey Berthold for surgical evaluation for recurrent left-sided pleural effusion.  She does have a history of right-sided breast cancer that was treated with partial mastectomy, radiation, and tamoxifen therapy.  Over the last month she has had worsening shortness of breath and was noted to have a large pleural effusion.  She is undergone thoracentesis on 3 occasions the last of which was 07/19/2020.  From a respiratory standpoint she states that she is feeling better.  She does complain of a fullness along her left diaphragm and chest wall.  She also admits to night sweats.       Zubrod Score: At the time of surgery this patient's most appropriate activity status/level should be described as: [x]     0    Normal activity, no symptoms []     1    Restricted in physical strenuous activity but ambulatory, able to do out light work []     2    Ambulatory and capable of self care, unable to do work activities, up and about               >50 % of waking hours                              []     3    Only limited self care, in bed greater than 50% of waking hours []     4    Completely disabled, no self care, confined to bed or chair []     5    Moribund   Past Medical History:  Diagnosis Date   Anxiety    Family history of breast cancer    HSV (herpes simplex virus) anogenital infection    positive titer only    Past Surgical History:  Procedure Laterality Date   BREAST  LUMPECTOMY WITH AXILLARY LYMPH NODE BIOPSY Right 11/21/2018   Procedure: RIGHT BREAST LUMPECTOMY WITH SENTINEL LYMPH NODE BIOPSY;  Surgeon: Jovita Kussmaul, MD;  Location: [facility];  Service: General;  Laterality: Right;   BREAST REDUCTION SURGERY Bilateral 11/21/2018   Procedure: BILATERAL MAMMARY REDUCTION  (BREAST);  Surgeon: Wallace Going, DO;  Location: [facility];  Service: Plastics;  Laterality: Bilateral;   PORT-A-CATH REMOVAL N/A 07/06/2019   Procedure: REMOVAL PORT-A-CATH;  Surgeon: Jovita Kussmaul, MD;  Location: [facility];  Service: General;  Laterality: N/A;   PORTACATH PLACEMENT N/A 11/21/2018   Procedure: INSERTION LEFT PORT-A-CATH WITH ULTRASOUND GUIDANCE;  Surgeon: Jovita Kussmaul, MD;  Location: [facility];  Service: General;  Laterality: N/A;   TONSILLECTOMY  1987   TONSILLECTOMY     WISDOM TOOTH EXTRACTION      Family History  Problem Relation Age of Onset   Hypertension Father  Alcohol abuse Paternal Grandfather    Heart disease Paternal Grandfather    Alcohol abuse Maternal Grandfather    Heart disease Maternal Grandmother    Breast cancer Other      Social History   Tobacco Use  Smoking Status Former   Packs/day: 0.50   Years: 20.00   Pack years: 10.00   Types: Cigarettes   Start date: 01/13/2002   Quit date: 10/17/2016   Years since quitting: 3.7  Smokeless Tobacco Never    Social History   Substance and Sexual Activity  Alcohol Use Yes   Comment: Socially     No Active Allergies  Current Outpatient Medications  Medication Sig Dispense Refill   amphetamine-dextroamphetamine (ADDERALL XR) 20 MG 24 hr capsule Take 20 mg by mouth daily.     Ascorbic Acid (VITAMIN C) 1000 MG tablet Take 1,000 mg by mouth daily.     hydrOXYzine (ATARAX/VISTARIL) 10 MG tablet Take 10 mg by mouth at bedtime.      Multiple Vitamin (MULTIVITAMIN WITH MINERALS) TABS tablet Take 1 tablet by mouth daily.     naproxen sodium (ALEVE) 220 MG tablet Take 1 tablet (220  mg total) by mouth 3 (three) times daily with meals. 60 tablet 3   tacrolimus (PROTOPIC) 0.1 % ointment Apply topically 2 (two) times daily. 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     Tetrahydrozoline HCl (VISINE OP) Place 1 drop into both eyes daily as needed (redness).     venlafaxine XR (EFFEXOR-XR) 75 MG 24 hr capsule TAKE 1 CAPSULE BY MOUTH EVERY DAY 90 capsule 3   vitamin B-12 (CYANOCOBALAMIN) 1000 MCG tablet Take 1,000 mcg by mouth daily.     No current facility-administered medications for this visit.   Facility-Administered Medications Ordered in Other Visits  Medication Dose Route Frequency Provider Last Rate Last Admin   sodium chloride flush (NS) 0.9 % injection 10 mL  10 mL Intracatheter PRN Magrinat, Virgie Dad, MD   10 mL at 05/18/19 1553    Review of Systems  Constitutional:  Positive for malaise/fatigue. Negative for fever.  Respiratory:  Positive for cough and shortness of breath.   Musculoskeletal:  Positive for myalgias.  Psychiatric/Behavioral:  The patient is nervous/anxious.     PHYSICAL EXAMINATION: BP 124/75 (BP Location: Left Arm, Patient Position: Sitting)   Pulse (!) 119   Resp 20   Ht 5\' 5"  (1.651 m)   Wt 170 lb (77.1 kg)   SpO2 97% Comment: RA  BMI 28.29 kg/m  Physical Exam Constitutional:      General: She is not in acute distress.    Appearance: Normal appearance. She is normal weight. She is not ill-appearing.  HENT:     Head: Normocephalic and atraumatic.  Eyes:     Extraocular Movements: Extraocular movements intact.  Cardiovascular:     Rate and Rhythm: Tachycardia present.  Pulmonary:     Effort: Pulmonary effort is normal. No respiratory distress.  Abdominal:     General: Abdomen is flat. There is no distension.  Musculoskeletal:        General: Normal range of motion.     Cervical back: Normal range of motion.  Neurological:     General: No focal deficit  present.     Mental Status: She is alert and oriented to person, place, and time.    Diagnostic Studies & Laboratory data:  Recent Radiology Findings:   DG Chest 2 View  Result Date: 07/22/2020 CLINICAL DATA:  Pleural effusion EXAM: CHEST - 2 VIEW COMPARISON:  07/19/2020, PET CT 07/18/2020 FINDINGS: Moderate left pleural effusion with probable loculation. Airspace disease at the left base as before. Normal cardiomediastinal silhouette. No pneumothorax. IMPRESSION: No significant change in moderate left pleural effusion with probable loculation. Electronically Signed   By: Donavan Foil M.D.   On: 07/22/2020 15:40   CT CHEST W CONTRAST  Result Date: 07/03/2020 CLINICAL DATA:  Left pleural effusion. Personal history of breast carcinoma. EXAM: CT CHEST WITH CONTRAST TECHNIQUE: Multidetector CT imaging of the chest was performed during intravenous contrast administration. CONTRAST:  52mL OMNIPAQUE IOHEXOL 300 MG/ML  SOLN COMPARISON:  None. FINDINGS: Cardiovascular:  No acute findings. Mediastinum/Nodes: No pathologically enlarged lymph nodes identified. Ill-defined soft tissue density in the anterior mediastinum has a triangular shape, and is most consistent with thymic hyperplasia. Surgical clips are noted in the right axilla, however there is no evidence of axillary lymphadenopathy. Lungs/Pleura: A moderate multiloculated left pleural effusion is seen. Mild pleural thickening and enhancement is seen throughout the left pleural space, suspicious for a malignant effusion. Compressive atelectasis is seen in the left mid and lower lung which limits evaluation, however no definite left lung nodules or masses are identified. A 7 mm smoothly large dated pulmonary nodule is seen in the posterior right lower lobe on image 84/3. Two other tiny sub pleural nodules are seen in the lateral right upper lobe and anterior right middle lobe measuring up to 4 mm. No evidence of right-sided pleural effusion. Upper  Abdomen:  Unremarkable. Musculoskeletal:  No suspicious bone lesions. IMPRESSION: Moderate multiloculated left pleural effusion with mild pleural thickening and enhancement, suspicious for malignant pleural effusion. Several right lung nodules, largest measuring 7 mm in right lower lobe. Pulmonary metastases cannot be excluded. 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 Electronically Signed   By: Marlaine Hind M.D.   On: 07/03/2020 15:17   NM PET Image Initial (PI) Skull Base To Thigh  Result Date: 07/19/2020 CLINICAL DATA:  Initial treatment strategy for loculated effusion and nodularity of the pleural surface in a patient with history of breast cancer. EXAM: NUCLEAR MEDICINE PET SKULL BASE TO THIGH TECHNIQUE: 8.8 mCi F-18 FDG was injected intravenously. Full-ring PET imaging was performed from the skull base to thigh after the radiotracer. CT data was obtained and used for attenuation correction and anatomic localization. Fasting blood glucose: 84 mg/dl COMPARISON:  Chest CT July 03, 2020. FINDINGS: Mediastinal blood pool activity: SUV max 1.97 Liver activity: SUV max NA NECK: No hypermetabolic lymph nodes in the neck. Incidental CT findings: none CHEST: Circumferential pleural nodularity associated with the loculated pleural effusion that was seen on previous chest imaging. This involves the entire circumference of the pleura and shows a maximum SUV in the range of 9.5 posteriorly along the inferior chest and 13.1 anteriorly along the inferior chest measured on image 123 of series 3. Circumferential pleural thickening is more pronounced in the costodiaphragmatic sulci better demonstrated on the contrasted CT. Nodularity extends along the LEFT mediastinal border (image 86/3). Measures up to 7 mm greatest thickness and shows a maximum SUV in the range of 8.2 LEFT retropectoral lymph node (image 215/6, 76/3 with a maximum SUV of 2.6, this measures less than a cm and there are small retropectoral lymph  nodes on the LEFT and a small LEFT supraclavicular lymph node as well on image 66/3 that measures 7 mm showing a  maximum SUV of 2.2 Signs of RIGHT axillary dissection with a maximum SUV of 1.8 and some soft tissue surrounding surgical clips in this area measuring 1.8 x 1.1 cm (image 85/3) RIGHT retrocrural lymph node (image 130/3) maximum SUV of 5.3 5 mm. No discrete mediastinal adenopathy. Some uptake about the LEFT hilum is contiguous with pleural thickening. 6 mm pulmonary nodule in the RIGHT upper lobe (image 89/3) this is not associated with increased FDG uptake. Well-circumscribed RIGHT lower lobe pulmonary nodule (image 110/3) 7 mm maximum SUV of 1.1 Fissural nodularity in the LEFT mid chest (image 93/3) 11 mm with an X min SUV of 5.6 Incidental CT findings: Normal caliber of the thoracic aorta. Normal heart size. Pericardial thickening and thickening along the LEFT mediastinal border. Is normal caliber of the central pulmonary vessels. Limited assessment of cardiovascular structures given lack of intravenous contrast. Loculated LEFT effusion of similar volume to previous imaging. ABDOMEN/PELVIS: Heterogeneous uptake in the liver without discrete CT correlate. More focal area of uptake in hepatic subsegment VIII is equivocal with a maximum SUV of 3.6 (image 106/2) this is of uncertain significance given the heterogeneity seen with respect to hepatic uptake in general. No signs of visceral or nodal disease in the abdomen Incidental CT findings: No acute findings relative liver, gallbladder, spleen, pancreas, adrenal glands or kidneys. The urinary bladder is collapsed. No signs of bowel obstruction. Appendix is normal. IUD in place. No intra-abdominal ascites. SKELETON: No focal hypermetabolic activity to suggest skeletal metastasis. Incidental CT findings: none IMPRESSION: Signs of circumferential pleural disease most likely related to metastatic disease from breast cancer given patient history. This pattern  can also be seen in the setting of mesothelioma and is associated with a loculated LEFT pleural effusion. Retrocrural lymph node with definite involvement. Nodules in the RIGHT chest without increased metabolic activity dominant nodule in the RIGHT lower lobe. Suspicious given other findings, attention on follow-up. Equivocal retropectoral and supraclavicular lymph nodes, attention on follow-up. Heterogeneous uptake pattern in the liver. Slightly more pronounced area in the posterior RIGHT hemi liver is equivocal. Could consider hepatic MRI for further assessment with and without contrast. Eovist contrast is often helpful in this setting. Electronically Signed   By: Zetta Bills M.D.   On: 07/19/2020 15:37   DG Chest Port 1 View  Result Date: 07/19/2020 CLINICAL DATA:  Post left-sided thoracentesis. EXAM: PORTABLE CHEST 1 VIEW COMPARISON:  07/01/2020; chest CT-07/03/2020; PET-CT-07/18/2020 FINDINGS: Grossly unchanged cardiac silhouette and mediastinal contours. Interval reduction in persistent moderate-sized partially loculated left-sided effusion post thoracentesis. No pneumothorax. Residual left basilar heterogeneous/consolidative opacities, unchanged. The right hemithorax remains well aerated. No new focal airspace opacities. No evidence of edema. No acute osseous abnormalities. Surgical clips overlie the upper outer quadrant of the right breast. IMPRESSION: 1. Slight reduction in persistent moderate sized partially loculated left-sided effusion post thoracentesis. No pneumothorax. 2. Unchanged left basilar heterogeneous/consolidative opacities, likely atelectasis. Electronically Signed   By: Sandi Mariscal M.D.   On: 07/19/2020 15:20   US THORACENTESIS ASP PLEURAL SPACE W/IMG GUIDE  Result Date: 07/19/2020 INDICATION: Recurrent symptomatic left-sided pleural effusion. Please perform ultrasound-guided thoracentesis for diagnostic and therapeutic purposes. EXAM: US THORACENTESIS ASP PLEURAL SPACE W/IMG  GUIDE COMPARISON:  PET-CT-07/18/2020; chest CT-07/03/2020; ultrasound-guided left-sided thoracentesis-07/03/2020 (yielding 700 cc of pleural fluid). MEDICATIONS: None. COMPLICATIONS: None immediate. TECHNIQUE: Informed written consent was obtained from the patient after a discussion of the risks, benefits and alternatives to treatment. A timeout was performed prior to the initiation of the procedure. Initial ultrasound  scanning demonstrates a moderate sized recurrent left-sided pleural effusion the basilar component of which is noted to be extensively loculated. An adequate percutaneous window was identified at the level of the left mid lateral chest and the procedure was planned. The chest was prepped and draped in the usual sterile fashion. 1% lidocaine was used for local anesthesia. An ultrasound image was saved for documentation purposes. An 8 Fr Safe-T-Centesis catheter was introduced. The thoracentesis was performed. The catheter was removed and a dressing was applied. The patient tolerated the procedure well without immediate post procedural complication. The patient was escorted to have an upright chest radiograph. FINDINGS: A total of approximately 275 cc of serous fluid was removed. Requested samples were sent to the laboratory. IMPRESSION: Successful ultrasound-guided left sided thoracentesis yielding 275 cc of pleural fluid. Note, an incomplete thoracentesis was performed secondary to extensive loculations within the pleural fluid. Electronically Signed   By: Sandi Mariscal M.D.   On: 07/19/2020 15:35   US THORACENTESIS ASP PLEURAL SPACE W/IMG GUIDE  Result Date: 07/03/2020 INDICATION: 40 year old with history of right breast cancer. Recently found to have a large left pleural effusion and underwent a left thoracentesis at a different facility. Patient presents for another diagnostic and therapeutic thoracentesis. EXAM: ULTRASOUND GUIDED LEFT THORACENTESIS MEDICATIONS: None. COMPLICATIONS: None  immediate. PROCEDURE: An ultrasound guided thoracentesis was thoroughly discussed with the patient and questions answered. The benefits, risks, alternatives and complications were also discussed. The patient understands and wishes to proceed with the procedure. Written consent was obtained. Ultrasound was performed to localize and mark an adequate pocket of fluid in the left chest. The area was then prepped and draped in the normal sterile fashion. 1% Lidocaine was used for local anesthesia. Under ultrasound guidance a 6 Fr Safe-T-Centesis catheter was introduced. Thoracentesis was performed. The catheter was removed and a dressing applied. FINDINGS: A total of approximately 700 mL of amber colored fluid was removed. Samples were sent to the laboratory as requested by the clinical team. IMPRESSION: Successful ultrasound guided left thoracentesis yielding 700 mL of pleural fluid. Electronically Signed   By: Markus Daft M.D.   On: 07/03/2020 14:57       I have independently reviewed the above radiology studies  and reviewed the findings with the patient.   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  The PET/CT was reviewed, and she appears to have nodularity involving the pleural space that is avid.  We discussed several options for obtaining a biopsy given that all of the thoracentesis have been negative for malignancy.  She would like to proceed with a surgical biopsy, and Pleurx catheter placement.   This will be performed via a left thoracoscopy.  She is tentatively scheduled for July 29, 2020.     I  spent 55 minutes with  the patient face to face in counseling and coordination of care.  Lajuana Matte 07/22/2020 5:28 PM

## 2020-07-22 NOTE — H&P (View-Only) (Signed)
[address]       [place] [address]             [phone]                    Shadia B Asher [facility] Medical Record #[id number] Date of Birth: 01/16/1980  Referring: Tyler Pita, MD Primary Care: Dion Body, MD Primary Cardiologist: None  Chief Complaint:    Chief Complaint  Patient presents with   Pleural Effusion    Initial surgical consult, PET 7/14, CT chest 6/29, US thoracentesis 6/29    History of Present Illness:    FEDORA KNISELY 40 y.o. female referred by Dr. Patsey Berthold for surgical evaluation for recurrent left-sided pleural effusion.  She does have a history of right-sided breast cancer that was treated with partial mastectomy, radiation, and tamoxifen therapy.  Over the last month she has had worsening shortness of breath and was noted to have a large pleural effusion.  She is undergone thoracentesis on 3 occasions the last of which was 07/19/2020.  From a respiratory standpoint she states that she is feeling better.  She does complain of a fullness along her left diaphragm and chest wall.  She also admits to night sweats.       Zubrod Score: At the time of surgery this patient's most appropriate activity status/level should be described as: [x]     0    Normal activity, no symptoms []     1    Restricted in physical strenuous activity but ambulatory, able to do out light work []     2    Ambulatory and capable of self care, unable to do work activities, up and about               >50 % of waking hours                              []     3    Only limited self care, in bed greater than 50% of waking hours []     4    Completely disabled, no self care, confined to bed or chair []     5    Moribund   Past Medical History:  Diagnosis Date   Anxiety    Family history of breast cancer    HSV (herpes simplex virus) anogenital infection    positive titer only    Past Surgical History:  Procedure Laterality Date   BREAST  LUMPECTOMY WITH AXILLARY LYMPH NODE BIOPSY Right 11/21/2018   Procedure: RIGHT BREAST LUMPECTOMY WITH SENTINEL LYMPH NODE BIOPSY;  Surgeon: Jovita Kussmaul, MD;  Location: [facility];  Service: General;  Laterality: Right;   BREAST REDUCTION SURGERY Bilateral 11/21/2018   Procedure: BILATERAL MAMMARY REDUCTION  (BREAST);  Surgeon: Wallace Going, DO;  Location: [facility];  Service: Plastics;  Laterality: Bilateral;   PORT-A-CATH REMOVAL N/A 07/06/2019   Procedure: REMOVAL PORT-A-CATH;  Surgeon: Jovita Kussmaul, MD;  Location: [facility];  Service: General;  Laterality: N/A;   PORTACATH PLACEMENT N/A 11/21/2018   Procedure: INSERTION LEFT PORT-A-CATH WITH ULTRASOUND GUIDANCE;  Surgeon: Jovita Kussmaul, MD;  Location: [facility];  Service: General;  Laterality: N/A;   TONSILLECTOMY  1987   TONSILLECTOMY     WISDOM TOOTH EXTRACTION      Family History  Problem Relation Age of Onset   Hypertension Father  Alcohol abuse Paternal Grandfather    Heart disease Paternal Grandfather    Alcohol abuse Maternal Grandfather    Heart disease Maternal Grandmother    Breast cancer Other      Social History   Tobacco Use  Smoking Status Former   Packs/day: 0.50   Years: 20.00   Pack years: 10.00   Types: Cigarettes   Start date: 01/13/2002   Quit date: 10/17/2016   Years since quitting: 3.7  Smokeless Tobacco Never    Social History   Substance and Sexual Activity  Alcohol Use Yes   Comment: Socially     No Active Allergies  Current Outpatient Medications  Medication Sig Dispense Refill   amphetamine-dextroamphetamine (ADDERALL XR) 20 MG 24 hr capsule Take 20 mg by mouth daily.     Ascorbic Acid (VITAMIN C) 1000 MG tablet Take 1,000 mg by mouth daily.     hydrOXYzine (ATARAX/VISTARIL) 10 MG tablet Take 10 mg by mouth at bedtime.      Multiple Vitamin (MULTIVITAMIN WITH MINERALS) TABS tablet Take 1 tablet by mouth daily.     naproxen sodium (ALEVE) 220 MG tablet Take 1 tablet (220  mg total) by mouth 3 (three) times daily with meals. 60 tablet 3   tacrolimus (PROTOPIC) 0.1 % ointment Apply topically 2 (two) times daily. 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     Tetrahydrozoline HCl (VISINE OP) Place 1 drop into both eyes daily as needed (redness).     venlafaxine XR (EFFEXOR-XR) 75 MG 24 hr capsule TAKE 1 CAPSULE BY MOUTH EVERY DAY 90 capsule 3   vitamin B-12 (CYANOCOBALAMIN) 1000 MCG tablet Take 1,000 mcg by mouth daily.     No current facility-administered medications for this visit.   Facility-Administered Medications Ordered in Other Visits  Medication Dose Route Frequency Provider Last Rate Last Admin   sodium chloride flush (NS) 0.9 % injection 10 mL  10 mL Intracatheter PRN Magrinat, Virgie Dad, MD   10 mL at 05/18/19 1553    Review of Systems  Constitutional:  Positive for malaise/fatigue. Negative for fever.  Respiratory:  Positive for cough and shortness of breath.   Musculoskeletal:  Positive for myalgias.  Psychiatric/Behavioral:  The patient is nervous/anxious.     PHYSICAL EXAMINATION: BP 124/75 (BP Location: Left Arm, Patient Position: Sitting)   Pulse (!) 119   Resp 20   Ht 5\' 5"  (1.651 m)   Wt 170 lb (77.1 kg)   SpO2 97% Comment: RA  BMI 28.29 kg/m  Physical Exam Constitutional:      General: She is not in acute distress.    Appearance: Normal appearance. She is normal weight. She is not ill-appearing.  HENT:     Head: Normocephalic and atraumatic.  Eyes:     Extraocular Movements: Extraocular movements intact.  Cardiovascular:     Rate and Rhythm: Tachycardia present.  Pulmonary:     Effort: Pulmonary effort is normal. No respiratory distress.  Abdominal:     General: Abdomen is flat. There is no distension.  Musculoskeletal:        General: Normal range of motion.     Cervical back: Normal range of motion.  Neurological:     General: No focal deficit  present.     Mental Status: She is alert and oriented to person, place, and time.    Diagnostic Studies & Laboratory data:  Recent Radiology Findings:   DG Chest 2 View  Result Date: 07/22/2020 CLINICAL DATA:  Pleural effusion EXAM: CHEST - 2 VIEW COMPARISON:  07/19/2020, PET CT 07/18/2020 FINDINGS: Moderate left pleural effusion with probable loculation. Airspace disease at the left base as before. Normal cardiomediastinal silhouette. No pneumothorax. IMPRESSION: No significant change in moderate left pleural effusion with probable loculation. Electronically Signed   By: Donavan Foil M.D.   On: 07/22/2020 15:40   CT CHEST W CONTRAST  Result Date: 07/03/2020 CLINICAL DATA:  Left pleural effusion. Personal history of breast carcinoma. EXAM: CT CHEST WITH CONTRAST TECHNIQUE: Multidetector CT imaging of the chest was performed during intravenous contrast administration. CONTRAST:  29mL OMNIPAQUE IOHEXOL 300 MG/ML  SOLN COMPARISON:  None. FINDINGS: Cardiovascular:  No acute findings. Mediastinum/Nodes: No pathologically enlarged lymph nodes identified. Ill-defined soft tissue density in the anterior mediastinum has a triangular shape, and is most consistent with thymic hyperplasia. Surgical clips are noted in the right axilla, however there is no evidence of axillary lymphadenopathy. Lungs/Pleura: A moderate multiloculated left pleural effusion is seen. Mild pleural thickening and enhancement is seen throughout the left pleural space, suspicious for a malignant effusion. Compressive atelectasis is seen in the left mid and lower lung which limits evaluation, however no definite left lung nodules or masses are identified. A 7 mm smoothly large dated pulmonary nodule is seen in the posterior right lower lobe on image 84/3. Two other tiny sub pleural nodules are seen in the lateral right upper lobe and anterior right middle lobe measuring up to 4 mm. No evidence of right-sided pleural effusion. Upper  Abdomen:  Unremarkable. Musculoskeletal:  No suspicious bone lesions. IMPRESSION: Moderate multiloculated left pleural effusion with mild pleural thickening and enhancement, suspicious for malignant pleural effusion. Several right lung nodules, largest measuring 7 mm in right lower lobe. Pulmonary metastases cannot be excluded. 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 Electronically Signed   By: Marlaine Hind M.D.   On: 07/03/2020 15:17   NM PET Image Initial (PI) Skull Base To Thigh  Result Date: 07/19/2020 CLINICAL DATA:  Initial treatment strategy for loculated effusion and nodularity of the pleural surface in a patient with history of breast cancer. EXAM: NUCLEAR MEDICINE PET SKULL BASE TO THIGH TECHNIQUE: 8.8 mCi F-18 FDG was injected intravenously. Full-ring PET imaging was performed from the skull base to thigh after the radiotracer. CT data was obtained and used for attenuation correction and anatomic localization. Fasting blood glucose: 84 mg/dl COMPARISON:  Chest CT July 03, 2020. FINDINGS: Mediastinal blood pool activity: SUV max 1.97 Liver activity: SUV max NA NECK: No hypermetabolic lymph nodes in the neck. Incidental CT findings: none CHEST: Circumferential pleural nodularity associated with the loculated pleural effusion that was seen on previous chest imaging. This involves the entire circumference of the pleura and shows a maximum SUV in the range of 9.5 posteriorly along the inferior chest and 13.1 anteriorly along the inferior chest measured on image 123 of series 3. Circumferential pleural thickening is more pronounced in the costodiaphragmatic sulci better demonstrated on the contrasted CT. Nodularity extends along the LEFT mediastinal border (image 86/3). Measures up to 7 mm greatest thickness and shows a maximum SUV in the range of 8.2 LEFT retropectoral lymph node (image 215/6, 76/3 with a maximum SUV of 2.6, this measures less than a cm and there are small retropectoral lymph  nodes on the LEFT and a small LEFT supraclavicular lymph node as well on image 66/3 that measures 7 mm showing a  maximum SUV of 2.2 Signs of RIGHT axillary dissection with a maximum SUV of 1.8 and some soft tissue surrounding surgical clips in this area measuring 1.8 x 1.1 cm (image 85/3) RIGHT retrocrural lymph node (image 130/3) maximum SUV of 5.3 5 mm. No discrete mediastinal adenopathy. Some uptake about the LEFT hilum is contiguous with pleural thickening. 6 mm pulmonary nodule in the RIGHT upper lobe (image 89/3) this is not associated with increased FDG uptake. Well-circumscribed RIGHT lower lobe pulmonary nodule (image 110/3) 7 mm maximum SUV of 1.1 Fissural nodularity in the LEFT mid chest (image 93/3) 11 mm with an X min SUV of 5.6 Incidental CT findings: Normal caliber of the thoracic aorta. Normal heart size. Pericardial thickening and thickening along the LEFT mediastinal border. Is normal caliber of the central pulmonary vessels. Limited assessment of cardiovascular structures given lack of intravenous contrast. Loculated LEFT effusion of similar volume to previous imaging. ABDOMEN/PELVIS: Heterogeneous uptake in the liver without discrete CT correlate. More focal area of uptake in hepatic subsegment VIII is equivocal with a maximum SUV of 3.6 (image 578/4) this is of uncertain significance given the heterogeneity seen with respect to hepatic uptake in general. No signs of visceral or nodal disease in the abdomen Incidental CT findings: No acute findings relative liver, gallbladder, spleen, pancreas, adrenal glands or kidneys. The urinary bladder is collapsed. No signs of bowel obstruction. Appendix is normal. IUD in place. No intra-abdominal ascites. SKELETON: No focal hypermetabolic activity to suggest skeletal metastasis. Incidental CT findings: none IMPRESSION: Signs of circumferential pleural disease most likely related to metastatic disease from breast cancer given patient history. This pattern  can also be seen in the setting of mesothelioma and is associated with a loculated LEFT pleural effusion. Retrocrural lymph node with definite involvement. Nodules in the RIGHT chest without increased metabolic activity dominant nodule in the RIGHT lower lobe. Suspicious given other findings, attention on follow-up. Equivocal retropectoral and supraclavicular lymph nodes, attention on follow-up. Heterogeneous uptake pattern in the liver. Slightly more pronounced area in the posterior RIGHT hemi liver is equivocal. Could consider hepatic MRI for further assessment with and without contrast. Eovist contrast is often helpful in this setting. Electronically Signed   By: Zetta Bills M.D.   On: 07/19/2020 15:37   DG Chest Port 1 View  Result Date: 07/19/2020 CLINICAL DATA:  Post left-sided thoracentesis. EXAM: PORTABLE CHEST 1 VIEW COMPARISON:  07/01/2020; chest CT-07/03/2020; PET-CT-07/18/2020 FINDINGS: Grossly unchanged cardiac silhouette and mediastinal contours. Interval reduction in persistent moderate-sized partially loculated left-sided effusion post thoracentesis. No pneumothorax. Residual left basilar heterogeneous/consolidative opacities, unchanged. The right hemithorax remains well aerated. No new focal airspace opacities. No evidence of edema. No acute osseous abnormalities. Surgical clips overlie the upper outer quadrant of the right breast. IMPRESSION: 1. Slight reduction in persistent moderate sized partially loculated left-sided effusion post thoracentesis. No pneumothorax. 2. Unchanged left basilar heterogeneous/consolidative opacities, likely atelectasis. Electronically Signed   By: Sandi Mariscal M.D.   On: 07/19/2020 15:20   US THORACENTESIS ASP PLEURAL SPACE W/IMG GUIDE  Result Date: 07/19/2020 INDICATION: Recurrent symptomatic left-sided pleural effusion. Please perform ultrasound-guided thoracentesis for diagnostic and therapeutic purposes. EXAM: US THORACENTESIS ASP PLEURAL SPACE W/IMG  GUIDE COMPARISON:  PET-CT-07/18/2020; chest CT-07/03/2020; ultrasound-guided left-sided thoracentesis-07/03/2020 (yielding 700 cc of pleural fluid). MEDICATIONS: None. COMPLICATIONS: None immediate. TECHNIQUE: Informed written consent was obtained from the patient after a discussion of the risks, benefits and alternatives to treatment. A timeout was performed prior to the initiation of the procedure. Initial ultrasound  scanning demonstrates a moderate sized recurrent left-sided pleural effusion the basilar component of which is noted to be extensively loculated. An adequate percutaneous window was identified at the level of the left mid lateral chest and the procedure was planned. The chest was prepped and draped in the usual sterile fashion. 1% lidocaine was used for local anesthesia. An ultrasound image was saved for documentation purposes. An 8 Fr Safe-T-Centesis catheter was introduced. The thoracentesis was performed. The catheter was removed and a dressing was applied. The patient tolerated the procedure well without immediate post procedural complication. The patient was escorted to have an upright chest radiograph. FINDINGS: A total of approximately 275 cc of serous fluid was removed. Requested samples were sent to the laboratory. IMPRESSION: Successful ultrasound-guided left sided thoracentesis yielding 275 cc of pleural fluid. Note, an incomplete thoracentesis was performed secondary to extensive loculations within the pleural fluid. Electronically Signed   By: Sandi Mariscal M.D.   On: 07/19/2020 15:35   US THORACENTESIS ASP PLEURAL SPACE W/IMG GUIDE  Result Date: 07/03/2020 INDICATION: 40 year old with history of right breast cancer. Recently found to have a large left pleural effusion and underwent a left thoracentesis at a different facility. Patient presents for another diagnostic and therapeutic thoracentesis. EXAM: ULTRASOUND GUIDED LEFT THORACENTESIS MEDICATIONS: None. COMPLICATIONS: None  immediate. PROCEDURE: An ultrasound guided thoracentesis was thoroughly discussed with the patient and questions answered. The benefits, risks, alternatives and complications were also discussed. The patient understands and wishes to proceed with the procedure. Written consent was obtained. Ultrasound was performed to localize and mark an adequate pocket of fluid in the left chest. The area was then prepped and draped in the normal sterile fashion. 1% Lidocaine was used for local anesthesia. Under ultrasound guidance a 6 Fr Safe-T-Centesis catheter was introduced. Thoracentesis was performed. The catheter was removed and a dressing applied. FINDINGS: A total of approximately 700 mL of amber colored fluid was removed. Samples were sent to the laboratory as requested by the clinical team. IMPRESSION: Successful ultrasound guided left thoracentesis yielding 700 mL of pleural fluid. Electronically Signed   By: Markus Daft M.D.   On: 07/03/2020 14:57       I have independently reviewed the above radiology studies  and reviewed the findings with the patient.   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  The PET/CT was reviewed, and she appears to have nodularity involving the pleural space that is avid.  We discussed several options for obtaining a biopsy given that all of the thoracentesis have been negative for malignancy.  She would like to proceed with a surgical biopsy, and Pleurx catheter placement.   This will be performed via a left thoracoscopy.  She is tentatively scheduled for July 29, 2020.     I  spent 55 minutes with  the patient face to face in counseling and coordination of care.  Lajuana Matte 07/22/2020 5:28 PM

## 2020-07-23 ENCOUNTER — Other Ambulatory Visit: Payer: Self-pay | Admitting: *Deleted

## 2020-07-23 ENCOUNTER — Encounter: Payer: BC Managed Care – PPO | Admitting: Thoracic Surgery (Cardiothoracic Vascular Surgery)

## 2020-07-23 ENCOUNTER — Encounter: Payer: Self-pay | Admitting: *Deleted

## 2020-07-23 ENCOUNTER — Ambulatory Visit: Payer: BC Managed Care – PPO | Admitting: Pulmonary Disease

## 2020-07-23 DIAGNOSIS — J90 Pleural effusion, not elsewhere classified: Secondary | ICD-10-CM

## 2020-07-24 LAB — CYTOLOGY - NON PAP

## 2020-07-24 NOTE — Pre-Procedure Instructions (Signed)
Surgical Instructions    Your procedure is scheduled on 07/29/20.  Report to [facility] Main Entrance "A" at 05:30 A.M., then check in with the Admitting office.  Call this number if you have problems the morning of surgery:  [phone]   If you have any questions prior to your surgery date call [phone]: Open Monday-Friday 8am-4pm    Remember:  Do not eat or drink after midnight the night before your surgery    Take these medicines the morning of surgery with A SIP OF WATER  amphetamine-dextroamphetamine (ADDERALL XR) Tetrahydrozoline HCl (VISINE OP if needed  As of today, STOP taking any Aspirin (unless otherwise instructed by your surgeon) Aleve, Naproxen, Ibuprofen, Motrin, Advil, Goody's, BC's, all herbal medications, fish oil, and all vitamins.          Do not wear jewelry or makeup Do not wear lotions, powders, perfumes/colognes, or deodorant. Do not shave 48 hours prior to surgery.  Men may shave face and neck. Do not bring valuables to the hospital. DO Not wear nail polish, gel polish, artificial nails, or any other type of covering on  natural nails including finger and toenails. If patients have artificial nails, gel coating, etc. that need to be removed by a nail salon please have this removed prior to surgery or surgery may need to be canceled/delayed if the surgeon/ anesthesia feels like the patient is unable to be adequately monitored.             [facility] is not responsible for any belongings or valuables.  Do NOT Smoke (Tobacco/Vaping) or drink Alcohol 24 hours prior to your procedure If you use a CPAP at night, you may bring all equipment for your overnight stay.   Contacts, glasses, dentures or bridgework may not be worn into surgery, please bring cases for these belongings   For patients admitted to the hospital, discharge time will be determined by your treatment team.   Patients discharged the day of surgery will not be allowed to drive home, and  someone needs to stay with them for 24 hours.  ONLY 1 SUPPORT PERSON MAY BE PRESENT WHILE YOU ARE IN SURGERY. IF YOU ARE TO BE ADMITTED ONCE YOU ARE IN YOUR ROOM YOU WILL BE ALLOWED TWO (2) VISITORS.  Minor children may have two parents present. Special consideration for safety and communication needs will be reviewed on a case by case basis.  Special instructions:    Oral Hygiene is also important to reduce your risk of infection.  Remember - BRUSH YOUR TEETH THE MORNING OF SURGERY WITH YOUR REGULAR TOOTHPASTE   [facility]- Preparing For Surgery  Before surgery, you can play an important role. Because skin is not sterile, your skin needs to be as free of germs as possible. You can reduce the number of germs on your skin by washing with CHG (chlorahexidine gluconate) Soap before surgery.  CHG is an antiseptic cleaner which kills germs and bonds with the skin to continue killing germs even after washing.     Please do not use if you have an allergy to CHG or antibacterial soaps. If your skin becomes reddened/irritated stop using the CHG.  Do not shave (including legs and underarms) for at least 48 hours prior to first CHG shower. It is OK to shave your face.  Please follow these instructions carefully.     Shower the NIGHT BEFORE SURGERY and the MORNING OF SURGERY with CHG Soap.   If you chose to wash your  hair, wash your hair first as usual with your normal shampoo. After you shampoo, rinse your hair and body thoroughly to remove the shampoo.  Then [employer] and genitals (private parts) with your normal soap and rinse thoroughly to remove soap.  After that Use CHG Soap as you would any other liquid soap. You can apply CHG directly to the skin and wash gently with a scrungie or a clean washcloth.   Apply the CHG Soap to your body ONLY FROM THE NECK DOWN.  Do not use on open wounds or open sores. Avoid contact with your eyes, ears, mouth and genitals (private parts). Wash Face and genitals  (private parts)  with your normal soap.   Wash thoroughly, paying special attention to the area where your surgery will be performed.  Thoroughly rinse your body with warm water from the neck down.  DO NOT shower/wash with your normal soap after using and rinsing off the CHG Soap.  Pat yourself dry with a CLEAN TOWEL.  Wear CLEAN PAJAMAS to bed the night before surgery  Place CLEAN SHEETS on your bed the night before your surgery  DO NOT SLEEP WITH PETS.   Day of Surgery:  Take a shower with CHG soap. Wear Clean/Comfortable clothing the morning of surgery Do not apply any deodorants/lotions.   Remember to brush your teeth WITH YOUR REGULAR TOOTHPASTE.   Please read over the following fact sheets that you were given.

## 2020-07-25 ENCOUNTER — Encounter (HOSPITAL_COMMUNITY): Payer: Self-pay

## 2020-07-25 ENCOUNTER — Other Ambulatory Visit: Payer: Self-pay

## 2020-07-25 ENCOUNTER — Encounter (HOSPITAL_COMMUNITY)
Admission: RE | Admit: 2020-07-25 | Discharge: 2020-07-25 | Disposition: A | Discharge status: Home / Self Care | Payer: BC Managed Care – PPO | Source: Ambulatory Visit | Attending: Thoracic Surgery (Cardiothoracic Vascular Surgery) | Admitting: Thoracic Surgery (Cardiothoracic Vascular Surgery)

## 2020-07-25 DIAGNOSIS — Z20822 Contact with and (suspected) exposure to covid-19: Secondary | ICD-10-CM | POA: Insufficient documentation

## 2020-07-25 DIAGNOSIS — Z01818 Encounter for other preprocedural examination: Secondary | ICD-10-CM | POA: Diagnosis not present

## 2020-07-25 DIAGNOSIS — J90 Pleural effusion, not elsewhere classified: Secondary | ICD-10-CM | POA: Insufficient documentation

## 2020-07-25 LAB — TYPE AND SCREEN
ABO/RH(D): O POS
Antibody Screen: NEGATIVE

## 2020-07-25 LAB — CBC
HCT: 39.8 % (ref 36.0–46.0)
Hemoglobin: 13.1 g/dL (ref 12.0–15.0)
MCH: 30.7 pg (ref 26.0–34.0)
MCHC: 32.9 g/dL (ref 30.0–36.0)
MCV: 93.2 fL (ref 80.0–100.0)
Platelets: 307 10*3/uL (ref 150–400)
RBC: 4.27 MIL/uL (ref 3.87–5.11)
RDW: 11.9 % (ref 11.5–15.5)
WBC: 8.4 10*3/uL (ref 4.0–10.5)
nRBC: 0 % (ref 0.0–0.2)

## 2020-07-25 LAB — BLOOD GAS, ARTERIAL
Acid-Base Excess: 2.2 mmol/L — ABNORMAL HIGH (ref 0.0–2.0)
Bicarbonate: 26.1 mmol/L (ref 20.0–28.0)
Drawn by: 602861
FIO2: 21
O2 Saturation: 97 %
Patient temperature: 37
pCO2 arterial: 39.6 mmHg (ref 32.0–48.0)
pH, Arterial: 7.435 (ref 7.350–7.450)
pO2, Arterial: 85.3 mmHg (ref 83.0–108.0)

## 2020-07-25 LAB — COMPREHENSIVE METABOLIC PANEL
ALT: 23 U/L (ref 0–44)
AST: 35 U/L (ref 15–41)
Albumin: 3.4 g/dL — ABNORMAL LOW (ref 3.5–5.0)
Alkaline Phosphatase: 47 U/L (ref 38–126)
Anion gap: 8 (ref 5–15)
BUN: 9 mg/dL (ref 6–20)
CO2: 26 mmol/L (ref 22–32)
Calcium: 9.7 mg/dL (ref 8.9–10.3)
Chloride: 105 mmol/L (ref 98–111)
Creatinine, Ser: 0.57 mg/dL (ref 0.44–1.00)
GFR, Estimated: >60 mL/min (ref >60)
Glucose, Bld: 89 mg/dL (ref 70–99)
Potassium: 4.5 mmol/L (ref 3.5–5.1)
Sodium: 139 mmol/L (ref 135–145)
Total Bilirubin: 0.4 mg/dL (ref 0.3–1.2)
Total Protein: 7 g/dL (ref 6.5–8.1)

## 2020-07-25 LAB — SURGICAL PCR SCREEN
MRSA, PCR: NEGATIVE
Staphylococcus aureus: NEGATIVE

## 2020-07-25 LAB — URINALYSIS, ROUTINE W REFLEX MICROSCOPIC
Bilirubin Urine: NEGATIVE
Glucose, UA: NEGATIVE mg/dL
Hgb urine dipstick: NEGATIVE
Ketones, ur: NEGATIVE mg/dL
Leukocytes,Ua: NEGATIVE
Nitrite: NEGATIVE
Protein, ur: NEGATIVE mg/dL
Specific Gravity, Urine: 1.016 (ref 1.005–1.030)
pH: 7 (ref 5.0–8.0)

## 2020-07-25 LAB — PROTIME-INR
INR: 1 (ref 0.8–1.2)
Prothrombin Time: 13.1 seconds (ref 11.4–15.2)

## 2020-07-25 LAB — SARS CORONAVIRUS 2 (TAT 6-24 HRS): SARS Coronavirus 2: NEGATIVE

## 2020-07-25 LAB — APTT: aPTT: 27 seconds (ref 24–36)

## 2020-07-25 NOTE — Progress Notes (Signed)
PCP: Dr. Dion Body Cardiologist: denies  EKG: 07/25/20 CXR: DOS ECHO: denies Stress Test: denies Cardiac Cath: denies  Fasting Blood Sugar- na Checks Blood Sugar__na_ times a day  OSA/CPAP: No  ASA/Blood Thinners: NO  Covid test 07/25/20 at PAT  Anesthesia Review: No  Patient denies shortness of breath, fever, cough, and chest pain at PAT appointment.  Patient verbalized understanding of instructions provided today at the PAT appointment.  Patient asked to review instructions at home and day of surgery.

## 2020-07-25 NOTE — Pre-Procedure Instructions (Signed)
Surgical Instructions    Your procedure is scheduled on 07/29/20.  Report to [facility] Main Entrance "A" at 05:30 A.M., then check in with the Admitting office.  Call this number if you have problems the morning of surgery:  [phone]   If you have any questions prior to your surgery date call [phone]: Open Monday-Friday 8am-4pm    Remember:  Do not eat or drink after midnight the night before your surgery    Take these medicines the morning of surgery with A SIP OF WATER   Tetrahydrozoline HCl (VISINE OP if needed  As of today, STOP taking any Aspirin (unless otherwise instructed by your surgeon) Aleve, Naproxen, Ibuprofen, Motrin, Advil, Goody's, BC's, all herbal medications, fish oil, and all vitamins.          Do not wear jewelry or makeup Do not wear lotions, powders, perfumes/colognes, or deodorant. Do not shave 48 hours prior to surgery.  Men may shave face and neck. Do not bring valuables to the hospital. DO Not wear nail polish, gel polish, artificial nails, or any other type of covering on  natural nails including finger and toenails. If patients have artificial nails, gel coating, etc. that need to be removed by a nail salon please have this removed prior to surgery or surgery may need to be canceled/delayed if the surgeon/ anesthesia feels like the patient is unable to be adequately monitored.             [facility] is not responsible for any belongings or valuables.  Do NOT Smoke (Tobacco/Vaping) or drink Alcohol 24 hours prior to your procedure If you use a CPAP at night, you may bring all equipment for your overnight stay.   Contacts, glasses, dentures or bridgework may not be worn into surgery, please bring cases for these belongings   For patients admitted to the hospital, discharge time will be determined by your treatment team.   Patients discharged the day of surgery will not be allowed to drive home, and someone needs to stay with them for 24  hours.  ONLY 1 SUPPORT PERSON MAY BE PRESENT WHILE YOU ARE IN SURGERY. IF YOU ARE TO BE ADMITTED ONCE YOU ARE IN YOUR ROOM YOU WILL BE ALLOWED TWO (2) VISITORS.  Minor children may have two parents present. Special consideration for safety and communication needs will be reviewed on a case by case basis.  Special instructions:    Oral Hygiene is also important to reduce your risk of infection.  Remember - BRUSH YOUR TEETH THE MORNING OF SURGERY WITH YOUR REGULAR TOOTHPASTE   - Preparing For Surgery  Before surgery, you can play an important role. Because skin is not sterile, your skin needs to be as free of germs as possible. You can reduce the number of germs on your skin by washing with CHG (chlorahexidine gluconate) Soap before surgery.  CHG is an antiseptic cleaner which kills germs and bonds with the skin to continue killing germs even after washing.     Please do not use if you have an allergy to CHG or antibacterial soaps. If your skin becomes reddened/irritated stop using the CHG.  Do not shave (including legs and underarms) for at least 48 hours prior to first CHG shower. It is OK to shave your face.  Please follow these instructions carefully.     Shower the NIGHT BEFORE SURGERY and the MORNING OF SURGERY with CHG Soap.   If you chose to wash your hair, wash  your hair first as usual with your normal shampoo. After you shampoo, rinse your hair and body thoroughly to remove the shampoo.  Then [employer] and genitals (private parts) with your normal soap and rinse thoroughly to remove soap.  After that Use CHG Soap as you would any other liquid soap. You can apply CHG directly to the skin and wash gently with a scrungie or a clean washcloth.   Apply the CHG Soap to your body ONLY FROM THE NECK DOWN.  Do not use on open wounds or open sores. Avoid contact with your eyes, ears, mouth and genitals (private parts). Wash Face and genitals (private parts)  with your normal soap.    Wash thoroughly, paying special attention to the area where your surgery will be performed.  Thoroughly rinse your body with warm water from the neck down.  DO NOT shower/wash with your normal soap after using and rinsing off the CHG Soap.  Pat yourself dry with a CLEAN TOWEL.  Wear CLEAN PAJAMAS to bed the night before surgery  Place CLEAN SHEETS on your bed the night before your surgery  DO NOT SLEEP WITH PETS.   Day of Surgery:  Take a shower with CHG soap. Wear Clean/Comfortable clothing the morning of surgery Do not apply any deodorants/lotions.   Remember to brush your teeth WITH YOUR REGULAR TOOTHPASTE.   Please read over the following fact sheets that you were given.

## 2020-07-26 ENCOUNTER — Telehealth: Payer: Self-pay | Admitting: Pulmonary Disease

## 2020-07-26 NOTE — Telephone Encounter (Signed)
Lm for patient.  

## 2020-07-26 NOTE — Telephone Encounter (Signed)
Noted by triage.  °Will close encounter.  °

## 2020-07-26 NOTE — Telephone Encounter (Signed)
Spoke to patient, who stated that she is scheduled for surgery with Dr. Kipp Brood on 07/29/2020, however she is having second thoughts and would like to discuss further with Dr. Patsey Berthold.  She attempted to contact Dr. Lanney Gins but he is out of the office.  She is not sure if BX is needed, as she feels much better after abx that Dr. Kipp Brood prescribed and thoracentesis. Her energy is returning and cough has much improved.  She is questioning if a needle bx can be performed vs surgical bx.  She will stay in patient for one night after surgery with 1 catheter and 1 tube paced. She was was informed by lightfoot that fluid would not return after surgery. She is questioning what keeps the fluid from returning.  Dr. Patsey Berthold, please advise.

## 2020-07-26 NOTE — Telephone Encounter (Signed)
I have called the patient and discussed the benefits of biopsy via VATS.  Her pleural fluid has been negative so far and she has had recurrent effusion and this should be addressed with VATS.  All of her questions were answered to her satisfaction.

## 2020-07-28 NOTE — Anesthesia Preprocedure Evaluation (Addendum)
Anesthesia Evaluation  Patient identified by MRN, date of birth, ID band Patient awake    Reviewed: Allergy & Precautions, NPO status   Airway Mallampati: II  TM Distance: >3 FB     Dental   Pulmonary former smoker,    breath sounds clear to auscultation       Cardiovascular hypertension,  Rhythm:Regular Rate:Normal     Neuro/Psych    GI/Hepatic Neg liver ROS,   Endo/Other  negative endocrine ROS  Renal/GU negative Renal ROS     Musculoskeletal   Abdominal   Peds  Hematology   Anesthesia Other Findings   Reproductive/Obstetrics                            Anesthesia Physical Anesthesia Plan  ASA: 2  Anesthesia Plan: General   Post-op Pain Management:    Induction:   PONV Risk Score and Plan: 3 and Ondansetron, Dexamethasone and Midazolam  Airway Management Planned: Video Laryngoscope Planned  Additional Equipment: Arterial line  Intra-op Plan:   Post-operative Plan: Possible Post-op intubation/ventilation  Informed Consent:     Dental advisory given  Plan Discussed with: Anesthesiologist and CRNA  Anesthesia Plan Comments:        Anesthesia Quick Evaluation

## 2020-07-29 ENCOUNTER — Inpatient Hospital Stay (HOSPITAL_COMMUNITY): Payer: BC Managed Care – PPO | Admitting: Anesthesiology

## 2020-07-29 ENCOUNTER — Inpatient Hospital Stay (HOSPITAL_COMMUNITY): Payer: BC Managed Care – PPO

## 2020-07-29 ENCOUNTER — Inpatient Hospital Stay (HOSPITAL_COMMUNITY)
Admission: RE | Admit: 2020-07-29 | Discharge: 2020-07-31 | DRG: 167 | Disposition: A | Discharge status: Home Health | Payer: BC Managed Care – PPO | Attending: Thoracic Surgery (Cardiothoracic Vascular Surgery) | Admitting: Thoracic Surgery (Cardiothoracic Vascular Surgery)

## 2020-07-29 ENCOUNTER — Encounter (HOSPITAL_COMMUNITY)
Admission: RE | Disposition: A | Discharge status: Home Health | Payer: Self-pay | Source: Home / Self Care | Attending: Thoracic Surgery (Cardiothoracic Vascular Surgery)

## 2020-07-29 DIAGNOSIS — D62 Acute posthemorrhagic anemia: Secondary | ICD-10-CM | POA: Diagnosis not present

## 2020-07-29 DIAGNOSIS — C50411 Malignant neoplasm of upper-outer quadrant of right female breast: Secondary | ICD-10-CM

## 2020-07-29 DIAGNOSIS — Z79899 Other long term (current) drug therapy: Secondary | ICD-10-CM

## 2020-07-29 DIAGNOSIS — Z87891 Personal history of nicotine dependence: Secondary | ICD-10-CM

## 2020-07-29 DIAGNOSIS — Z79810 Long term (current) use of selective estrogen receptor modulators (SERMs): Secondary | ICD-10-CM

## 2020-07-29 DIAGNOSIS — C782 Secondary malignant neoplasm of pleura: Secondary | ICD-10-CM | POA: Diagnosis present

## 2020-07-29 DIAGNOSIS — Z170 Estrogen receptor positive status [ER+]: Secondary | ICD-10-CM

## 2020-07-29 DIAGNOSIS — Z803 Family history of malignant neoplasm of breast: Secondary | ICD-10-CM

## 2020-07-29 DIAGNOSIS — K5900 Constipation, unspecified: Secondary | ICD-10-CM | POA: Diagnosis present

## 2020-07-29 DIAGNOSIS — J90 Pleural effusion, not elsewhere classified: Secondary | ICD-10-CM | POA: Diagnosis present

## 2020-07-29 DIAGNOSIS — Z853 Personal history of malignant neoplasm of breast: Secondary | ICD-10-CM | POA: Diagnosis not present

## 2020-07-29 DIAGNOSIS — Z20822 Contact with and (suspected) exposure to covid-19: Secondary | ICD-10-CM | POA: Diagnosis present

## 2020-07-29 DIAGNOSIS — J939 Pneumothorax, unspecified: Secondary | ICD-10-CM

## 2020-07-29 DIAGNOSIS — J910 Malignant pleural effusion: Secondary | ICD-10-CM | POA: Diagnosis present

## 2020-07-29 DIAGNOSIS — L920 Granuloma annulare: Secondary | ICD-10-CM

## 2020-07-29 HISTORY — PX: VIDEO ASSISTED THORACOSCOPY: SHX5073

## 2020-07-29 HISTORY — PX: CHEST TUBE INSERTION: SHX231

## 2020-07-29 HISTORY — PX: PLEURAL BIOPSY: SHX5082

## 2020-07-29 HISTORY — PX: PLEURAL EFFUSION DRAINAGE: SHX5099

## 2020-07-29 LAB — POCT PREGNANCY, URINE: Preg Test, Ur: NEGATIVE

## 2020-07-29 IMAGING — CR DG CHEST 2V
2 series · 2 of 2 positions shown · non-contrast
Comparison: [DATE]

CLINICAL DATA: Preop for pleural effusion

EXAM:
CHEST - 2 VIEW

[w chest pa]
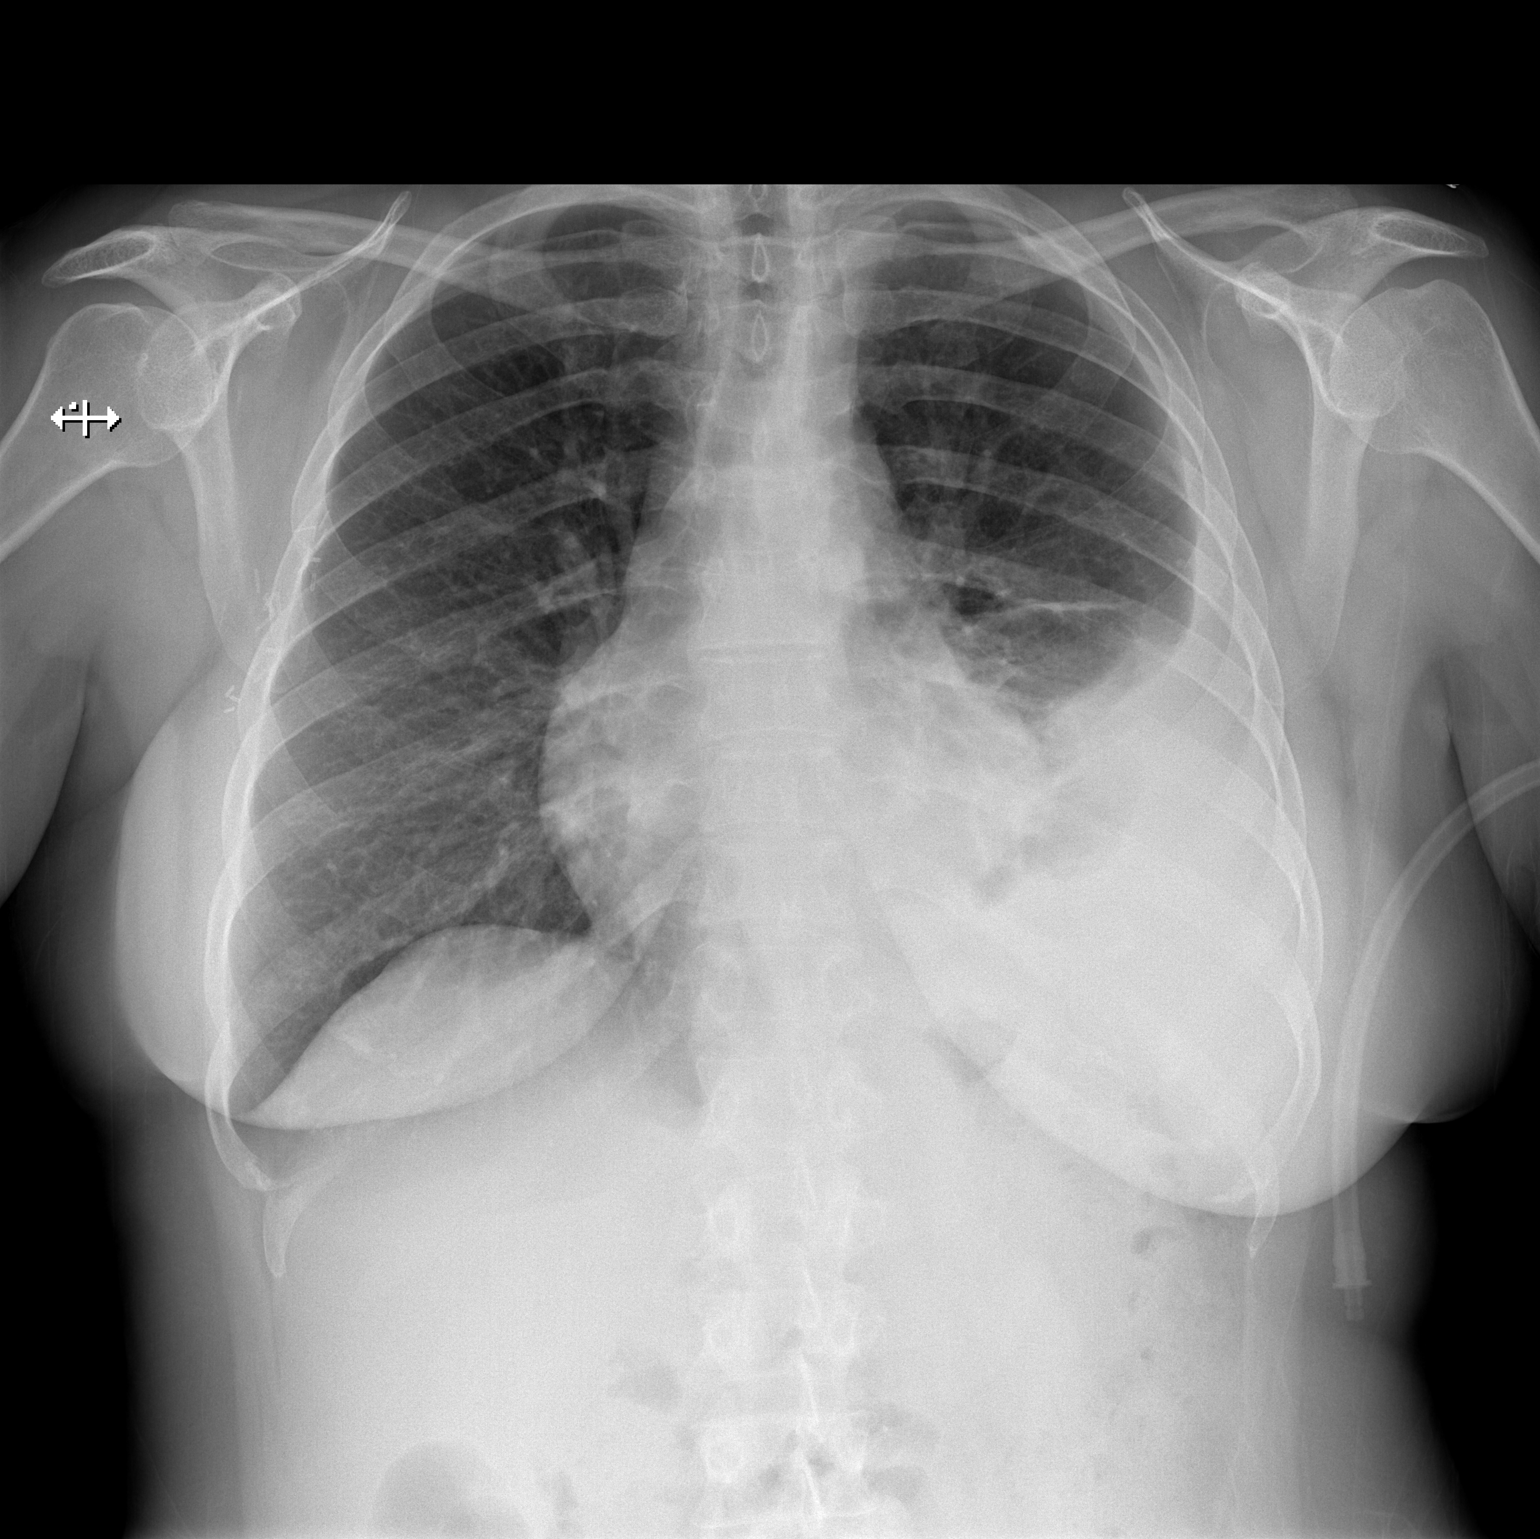

[w chest lat]
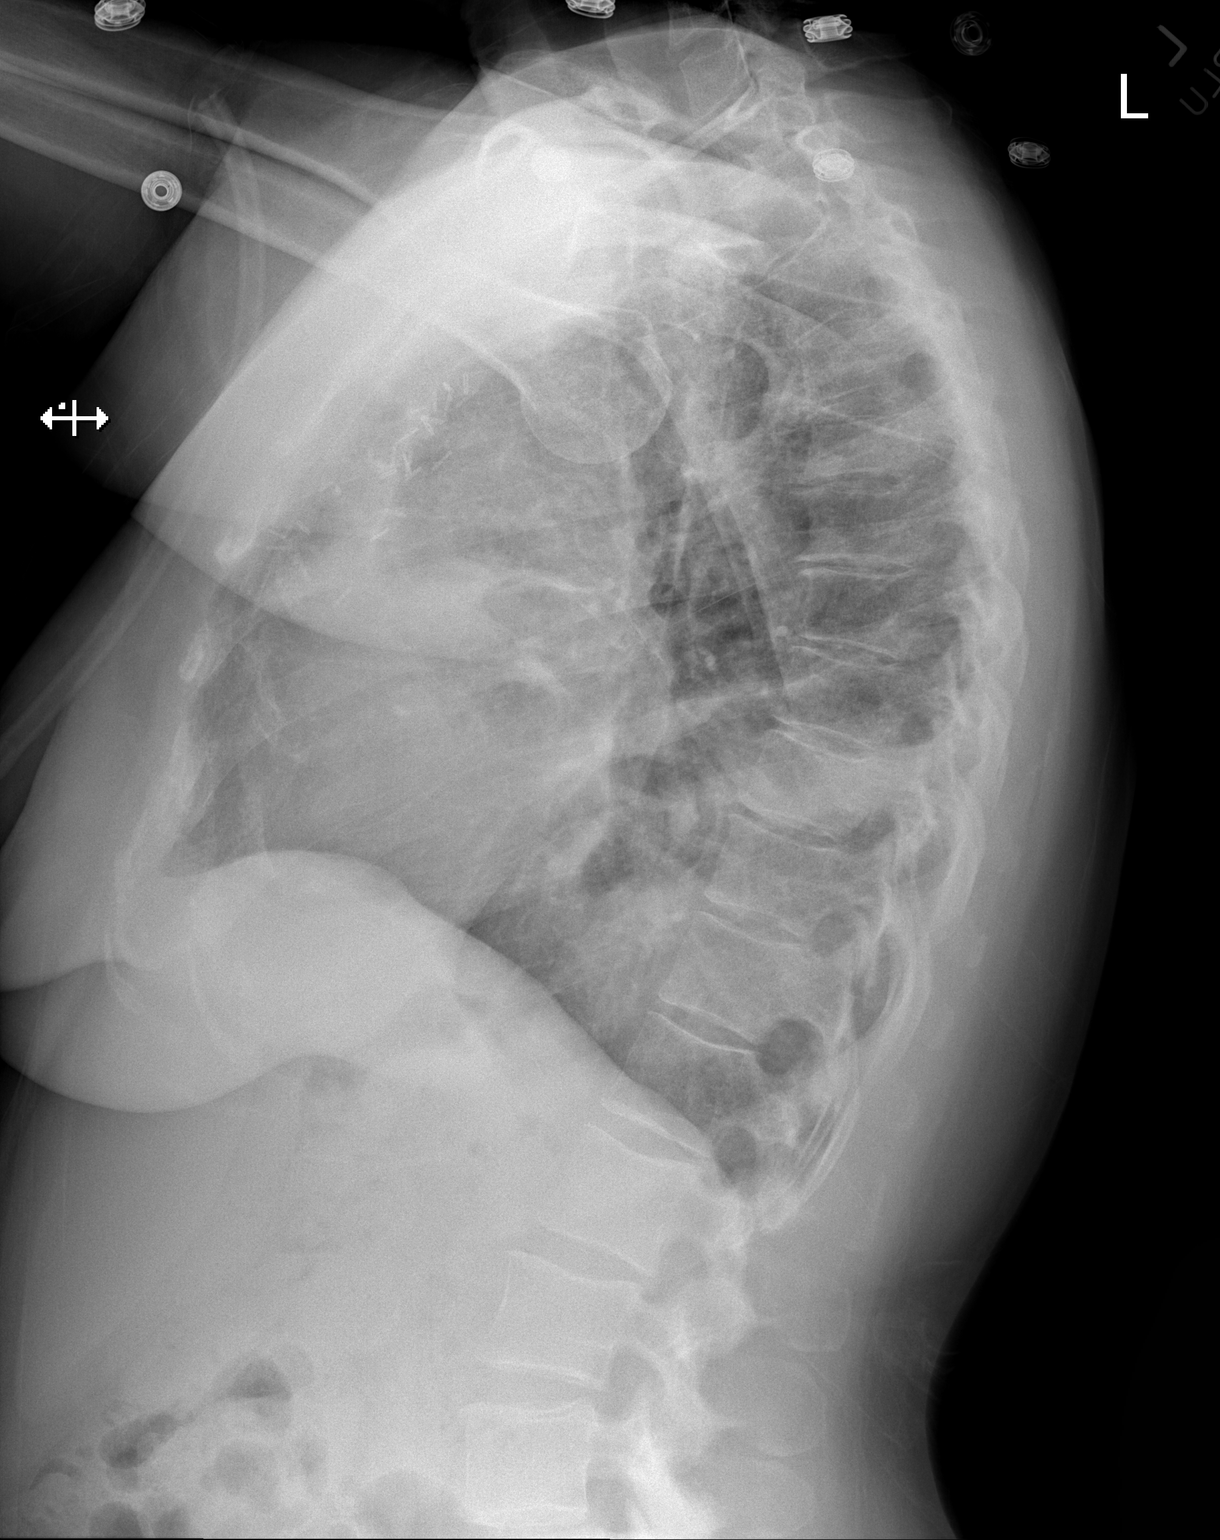

[2 of 2 positions shown; findings below may reference images not displayed]

FINDINGS: Similar degree of moderate left pleural effusion recently evaluated
by CT and PET. Normal heart size and mediastinal contours.
Postoperative right axilla.
IMPRESSION: Unchanged moderate left pleural effusion.

## 2020-07-29 IMAGING — DX DG CHEST 1V PORT
1 series · 1 of 1 positions shown · non-contrast
Comparison: Same day.

CLINICAL DATA: Status post pleural drainage catheter placement.

EXAM:
PORTABLE CHEST 1 VIEW

[chest ap]
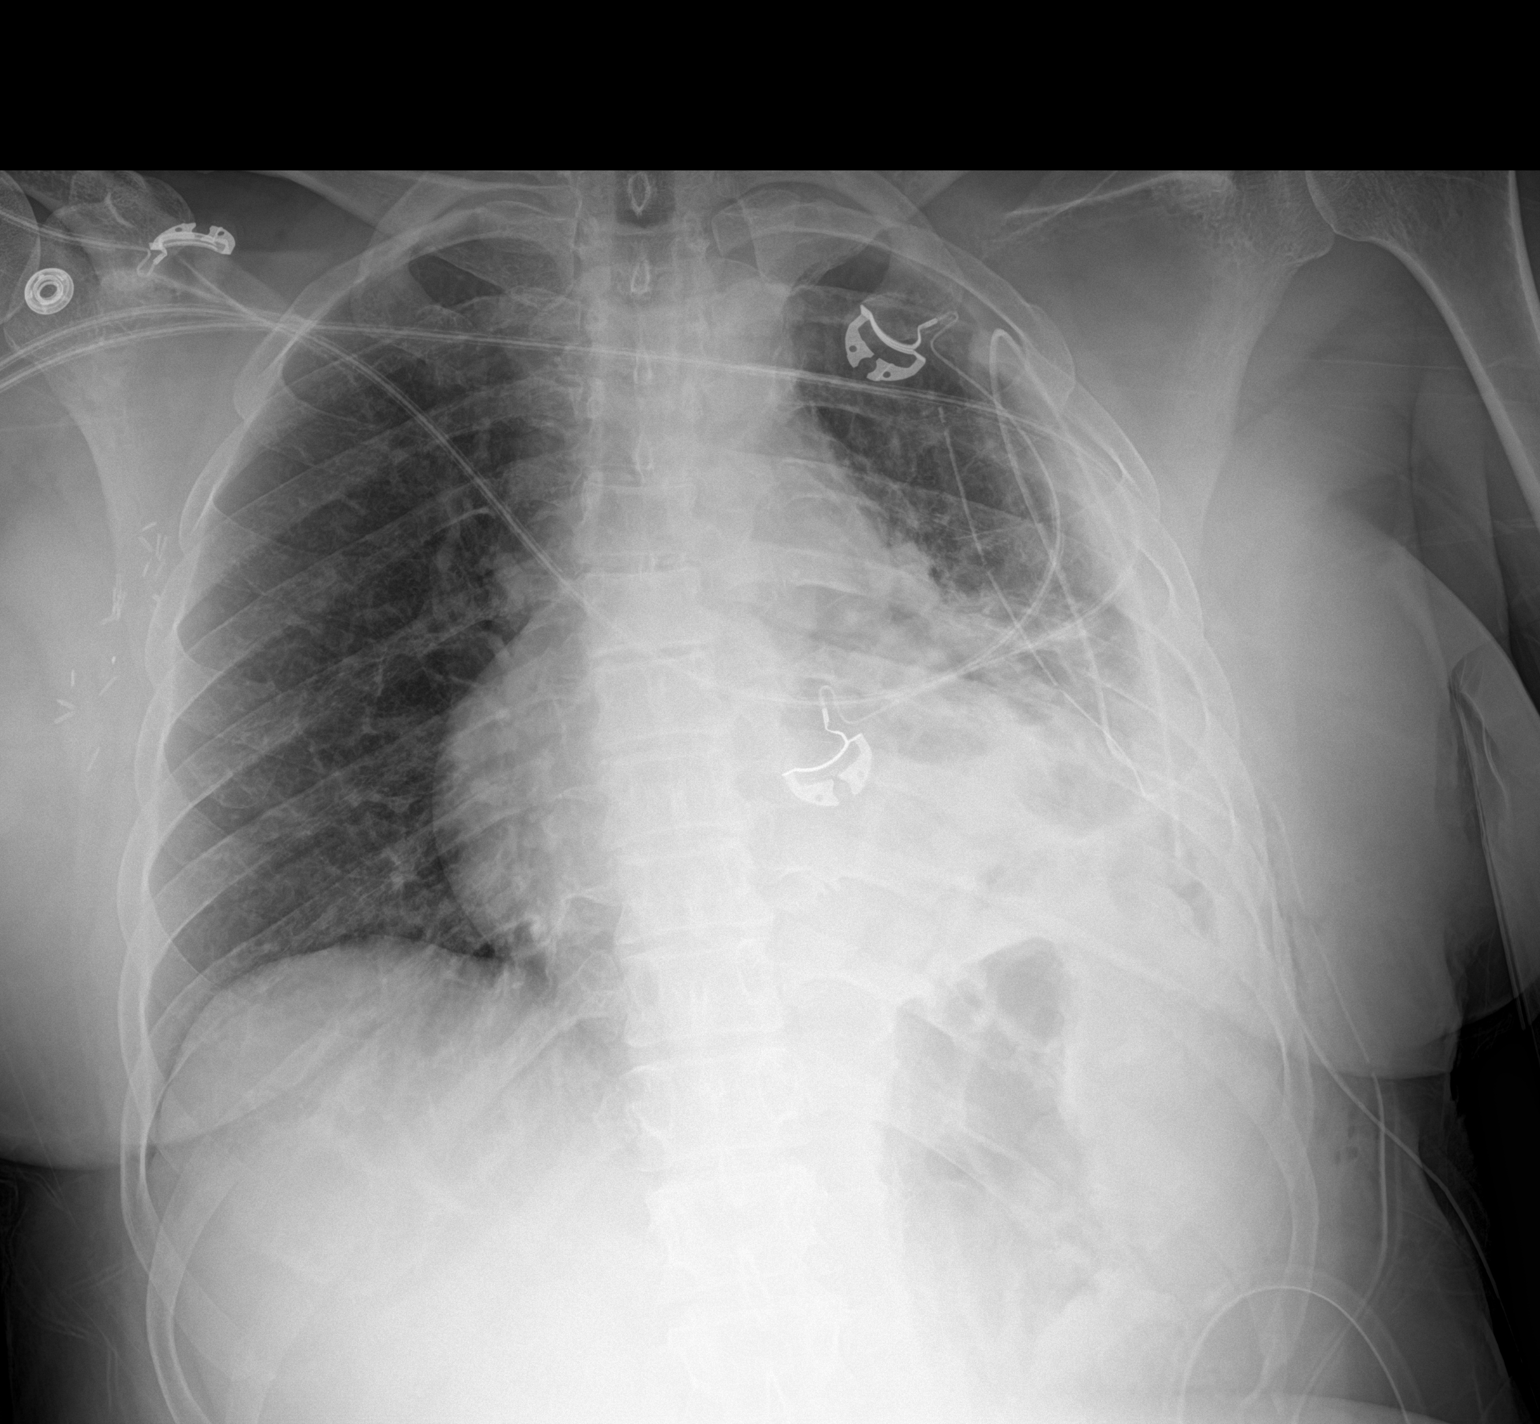

[1 of 1 positions shown; findings below may reference images not displayed]

FINDINGS: Stable cardiomegaly. Interval placement of left-sided chest tube.
Left pleural effusion is significantly smaller. Right lung is clear.
IMPRESSION: Left pleural effusion is significantly smaller status post
left-sided chest tube placement.

## 2020-07-29 SURGERY — VIDEO ASSISTED THORACOSCOPY
Anesthesia: General | Laterality: Left

## 2020-07-29 MED ORDER — FENTANYL CITRATE (PF) 100 MCG/2ML IJ SOLN
25.0000 ug | INTRAMUSCULAR | Status: DC | PRN
  Administered 2020-07-29 (×3): 50 ug via INTRAVENOUS

## 2020-07-29 MED ORDER — CEFAZOLIN SODIUM-DEXTROSE 2-4 GM/100ML-% IV SOLN
2.0000 g | INTRAVENOUS | Status: AC
  Administered 2020-07-29: 2 g via INTRAVENOUS
  Filled 2020-07-29: qty 100

## 2020-07-29 MED ORDER — ONDANSETRON HCL 4 MG/2ML IJ SOLN
INTRAMUSCULAR | Status: AC
  Filled 2020-07-29: qty 2

## 2020-07-29 MED ORDER — ORAL CARE MOUTH RINSE: 15.0000 mL | Freq: Once | OROMUCOSAL | Status: AC

## 2020-07-29 MED ORDER — FENTANYL CITRATE (PF) 250 MCG/5ML IJ SOLN
INTRAMUSCULAR | Status: AC
  Filled 2020-07-29: qty 5

## 2020-07-29 MED ORDER — DEXAMETHASONE SODIUM PHOSPHATE 10 MG/ML IJ SOLN
INTRAMUSCULAR | Status: DC | PRN
Start: 2020-07-29 — End: 2020-07-29
  Administered 2020-07-29: 10 mg via INTRAVENOUS

## 2020-07-29 MED ORDER — TACROLIMUS 0.1 % EX OINT: 1.0000 "application " | TOPICAL_OINTMENT | Freq: Every day | CUTANEOUS | Status: DC

## 2020-07-29 MED ORDER — LACTATED RINGERS IV SOLN: INTRAVENOUS | Status: DC

## 2020-07-29 MED ORDER — PROPOFOL 10 MG/ML IV BOLUS
INTRAVENOUS | Status: AC
  Filled 2020-07-29: qty 20

## 2020-07-29 MED ORDER — ONDANSETRON HCL 4 MG/2ML IJ SOLN
INTRAMUSCULAR | Status: DC | PRN
  Administered 2020-07-29: 4 mg via INTRAVENOUS

## 2020-07-29 MED ORDER — FENTANYL CITRATE (PF) 250 MCG/5ML IJ SOLN
INTRAMUSCULAR | Status: DC | PRN
  Administered 2020-07-29: 150 ug via INTRAVENOUS
  Administered 2020-07-29: 25 ug via INTRAVENOUS
  Administered 2020-07-29: 50 ug via INTRAVENOUS
  Administered 2020-07-29: 25 ug via INTRAVENOUS
  Administered 2020-07-29: 50 ug via INTRAVENOUS

## 2020-07-29 MED ORDER — VENLAFAXINE HCL ER 75 MG PO CP24
75.0000 mg | ORAL_CAPSULE | Freq: Every day | ORAL | Status: DC
  Administered 2020-07-29 – 2020-07-30 (×2): 75 mg via ORAL
  Filled 2020-07-29 (×3): qty 1

## 2020-07-29 MED ORDER — NAPROXEN SODIUM 220 MG PO TABS: 220.0000 mg | ORAL_TABLET | Freq: Three times a day (TID) | ORAL | 3 refills | Status: DC

## 2020-07-29 MED ORDER — MIDAZOLAM HCL 2 MG/2ML IJ SOLN
INTRAMUSCULAR | Status: DC | PRN
Start: 2020-07-29 — End: 2020-07-29
  Administered 2020-07-29: 2 mg via INTRAVENOUS

## 2020-07-29 MED ORDER — 0.9 % SODIUM CHLORIDE (POUR BTL) OPTIME
TOPICAL | Status: DC | PRN
  Administered 2020-07-29: 2000 mL

## 2020-07-29 MED ORDER — ACETAMINOPHEN 500 MG PO TABS
ORAL_TABLET | ORAL | Status: AC
  Filled 2020-07-29: qty 2

## 2020-07-29 MED ORDER — ROCURONIUM BROMIDE 10 MG/ML (PF) SYRINGE
PREFILLED_SYRINGE | INTRAVENOUS | Status: DC | PRN
  Administered 2020-07-29: 60 mg via INTRAVENOUS
  Administered 2020-07-29: 20 mg via INTRAVENOUS

## 2020-07-29 MED ORDER — DEXAMETHASONE SODIUM PHOSPHATE 10 MG/ML IJ SOLN
INTRAMUSCULAR | Status: AC
  Filled 2020-07-29: qty 1

## 2020-07-29 MED ORDER — ROCURONIUM BROMIDE 10 MG/ML (PF) SYRINGE
PREFILLED_SYRINGE | INTRAVENOUS | Status: AC
  Filled 2020-07-29: qty 10

## 2020-07-29 MED ORDER — TAMOXIFEN CITRATE 10 MG PO TABS
20.0000 mg | ORAL_TABLET | Freq: Every day | ORAL | Status: DC
  Administered 2020-07-29 – 2020-07-30 (×2): 20 mg via ORAL
  Filled 2020-07-29 (×3): qty 2

## 2020-07-29 MED ORDER — BUPIVACAINE HCL (PF) 0.5 % IJ SOLN
INTRAMUSCULAR | Status: AC
  Filled 2020-07-29: qty 30

## 2020-07-29 MED ORDER — FENTANYL CITRATE (PF) 100 MCG/2ML IJ SOLN
INTRAMUSCULAR | Status: AC
  Filled 2020-07-29: qty 2

## 2020-07-29 MED ORDER — CEFAZOLIN SODIUM-DEXTROSE 2-4 GM/100ML-% IV SOLN
INTRAVENOUS | Status: AC
  Filled 2020-07-29: qty 100

## 2020-07-29 MED ORDER — AMPHETAMINE-DEXTROAMPHETAMINE 10 MG PO TABS: 10.0000 mg | ORAL_TABLET | Freq: Every day | ORAL | Status: DC

## 2020-07-29 MED ORDER — HYDROXYZINE HCL 10 MG PO TABS
10.0000 mg | ORAL_TABLET | Freq: Every evening | ORAL | Status: DC | PRN
  Administered 2020-07-29: 10 mg via ORAL
  Filled 2020-07-29: qty 1

## 2020-07-29 MED ORDER — MIDAZOLAM HCL 2 MG/2ML IJ SOLN
INTRAMUSCULAR | Status: AC
  Filled 2020-07-29: qty 2

## 2020-07-29 MED ORDER — CHLORHEXIDINE GLUCONATE 0.12 % MT SOLN
15.0000 mL | Freq: Once | OROMUCOSAL | Status: AC
  Administered 2020-07-29: 15 mL via OROMUCOSAL
  Filled 2020-07-29: qty 15

## 2020-07-29 MED ORDER — ACETAMINOPHEN 160 MG/5ML PO SOLN: 1000.0000 mg | Freq: Four times a day (QID) | ORAL | Status: DC

## 2020-07-29 MED ORDER — ACETAMINOPHEN 500 MG PO TABS
1000.0000 mg | ORAL_TABLET | Freq: Four times a day (QID) | ORAL | Status: DC
  Administered 2020-07-29: 1000 mg via ORAL

## 2020-07-29 MED ORDER — PHENYLEPHRINE HCL-NACL 10-0.9 MG/250ML-% IV SOLN
INTRAVENOUS | Status: DC | PRN
  Administered 2020-07-29: 20 ug/min via INTRAVENOUS

## 2020-07-29 MED ORDER — KETOROLAC TROMETHAMINE 30 MG/ML IJ SOLN
INTRAMUSCULAR | Status: DC | PRN
  Administered 2020-07-29: 30 mg via INTRAVENOUS

## 2020-07-29 MED ORDER — VENLAFAXINE HCL ER 75 MG PO CP24: 75.0000 mg | ORAL_CAPSULE | Freq: Every day | ORAL | Status: DC

## 2020-07-29 MED ORDER — TRAMADOL HCL 50 MG PO TABS
ORAL_TABLET | ORAL | Status: AC
  Filled 2020-07-29: qty 2

## 2020-07-29 MED ORDER — LIDOCAINE HCL (PF) 1 % IJ SOLN
INTRAMUSCULAR | Status: AC
  Filled 2020-07-29: qty 30

## 2020-07-29 MED ORDER — ONDANSETRON HCL 4 MG/2ML IJ SOLN: 4.0000 mg | Freq: Four times a day (QID) | INTRAMUSCULAR | Status: DC | PRN

## 2020-07-29 MED ORDER — LACTATED RINGERS IV SOLN: INTRAVENOUS | Status: DC | PRN

## 2020-07-29 MED ORDER — AMPHETAMINE-DEXTROAMPHET ER 10 MG PO CP24
30.0000 mg | ORAL_CAPSULE | Freq: Every day | ORAL | Status: DC
  Filled 2020-07-29: qty 3

## 2020-07-29 MED ORDER — SUGAMMADEX SODIUM 200 MG/2ML IV SOLN
INTRAVENOUS | Status: DC | PRN
  Administered 2020-07-29: 300 mg via INTRAVENOUS

## 2020-07-29 MED ORDER — KETOROLAC TROMETHAMINE 30 MG/ML IJ SOLN
INTRAMUSCULAR | Status: AC
  Filled 2020-07-29: qty 1

## 2020-07-29 MED ORDER — PROPOFOL 10 MG/ML IV BOLUS
INTRAVENOUS | Status: DC | PRN
  Administered 2020-07-29: 200 mg via INTRAVENOUS

## 2020-07-29 MED ORDER — BUPIVACAINE LIPOSOME 1.3 % IJ SUSP
INTRAMUSCULAR | Status: AC
  Filled 2020-07-29: qty 20

## 2020-07-29 MED ORDER — TRAMADOL HCL 50 MG PO TABS
50.0000 mg | ORAL_TABLET | Freq: Four times a day (QID) | ORAL | Status: DC | PRN
  Administered 2020-07-29 (×2): 100 mg via ORAL

## 2020-07-29 MED ORDER — ALPRAZOLAM 0.5 MG PO TABS
0.5000 mg | ORAL_TABLET | Freq: Two times a day (BID) | ORAL | Status: DC | PRN
  Administered 2020-07-29 – 2020-07-31 (×5): 0.5 mg via ORAL
  Filled 2020-07-29 (×6): qty 1

## 2020-07-29 MED ORDER — FENTANYL CITRATE (PF) 100 MCG/2ML IJ SOLN
25.0000 ug | INTRAMUSCULAR | Status: DC | PRN
  Administered 2020-07-29: 25 ug via INTRAVENOUS
  Administered 2020-07-29: 50 ug via INTRAVENOUS
  Administered 2020-07-29: 25 ug via INTRAVENOUS
  Administered 2020-07-29: 50 ug via INTRAVENOUS

## 2020-07-29 MED ORDER — BISACODYL 5 MG PO TBEC
10.0000 mg | DELAYED_RELEASE_TABLET | Freq: Every day | ORAL | Status: DC
  Administered 2020-07-29: 10 mg via ORAL
  Filled 2020-07-29 (×2): qty 2

## 2020-07-29 MED ORDER — LIDOCAINE 2% (20 MG/ML) 5 ML SYRINGE
INTRAMUSCULAR | Status: AC
  Filled 2020-07-29: qty 5

## 2020-07-29 MED ORDER — ENOXAPARIN SODIUM 40 MG/0.4ML IJ SOSY
40.0000 mg | PREFILLED_SYRINGE | INTRAMUSCULAR | Status: DC
  Administered 2020-07-30: 40 mg via SUBCUTANEOUS
  Filled 2020-07-29: qty 0.4

## 2020-07-29 MED ORDER — CEFAZOLIN SODIUM-DEXTROSE 2-4 GM/100ML-% IV SOLN
2.0000 g | Freq: Three times a day (TID) | INTRAVENOUS | Status: AC
Start: 2020-07-29 — End: 2020-07-29
  Administered 2020-07-29 (×2): 2 g via INTRAVENOUS
  Filled 2020-07-29 (×2): qty 100

## 2020-07-29 MED ORDER — SENNOSIDES-DOCUSATE SODIUM 8.6-50 MG PO TABS
1.0000 | ORAL_TABLET | Freq: Every day | ORAL | Status: DC
  Administered 2020-07-30: 1 via ORAL
  Filled 2020-07-29: qty 1

## 2020-07-29 MED ORDER — KETOROLAC TROMETHAMINE 30 MG/ML IJ SOLN
30.0000 mg | Freq: Four times a day (QID) | INTRAMUSCULAR | Status: DC
Start: 2020-07-29 — End: 2020-07-31
  Administered 2020-07-29 – 2020-07-31 (×6): 30 mg via INTRAVENOUS
  Filled 2020-07-29 (×7): qty 1

## 2020-07-29 MED ORDER — LIDOCAINE 2% (20 MG/ML) 5 ML SYRINGE
INTRAMUSCULAR | Status: DC | PRN
Start: 2020-07-29 — End: 2020-07-29
  Administered 2020-07-29: 80 mg via INTRAVENOUS

## 2020-07-29 MED ORDER — BUPIVACAINE LIPOSOME 1.3 % IJ SUSP
INTRAMUSCULAR | Status: DC | PRN
  Administered 2020-07-29: 50 mL

## 2020-07-29 SURGICAL SUPPLY — 44 items
BLADE CLIPPER SURG (BLADE) ×2 IMPLANT
CANISTER SUCT 3000ML PPV (MISCELLANEOUS) ×2 IMPLANT
CNTNR URN SCR LID CUP LEK RST (MISCELLANEOUS) ×1 IMPLANT
CONT SPEC 4OZ STRL OR WHT (MISCELLANEOUS) ×2
DEFOGGER SCOPE WARMER CLEARIFY (MISCELLANEOUS) ×2 IMPLANT
DERMABOND ADVANCED (GAUZE/BANDAGES/DRESSINGS) ×1
DERMABOND ADVANCED .7 DNX12 (GAUZE/BANDAGES/DRESSINGS) ×1 IMPLANT
DRAIN CHANNEL 19F RND (DRAIN) ×2 IMPLANT
DRAIN CONNECTOR BLAKE 1:1 (MISCELLANEOUS) ×2 IMPLANT
DRAPE INCISE 23X17 IOBAN STRL (DRAPES) ×1
DRAPE INCISE IOBAN 23X17 STRL (DRAPES) ×1 IMPLANT
ELECT BLADE 4.0 EZ CLEAN MEGAD (MISCELLANEOUS) ×2
ELECTRODE BLDE 4.0 EZ CLN MEGD (MISCELLANEOUS) ×1 IMPLANT
GAUZE SPONGE 4X4 12PLY STRL LF (GAUZE/BANDAGES/DRESSINGS) ×2 IMPLANT
GLOVE SURG ENC MOIS LTX SZ7.5 (GLOVE) ×4 IMPLANT
GOWN STRL REUS W/ TWL LRG LVL3 (GOWN DISPOSABLE) ×1 IMPLANT
GOWN STRL REUS W/ TWL XL LVL3 (GOWN DISPOSABLE) ×1 IMPLANT
GOWN STRL REUS W/TWL LRG LVL3 (GOWN DISPOSABLE) ×2
GOWN STRL REUS W/TWL XL LVL3 (GOWN DISPOSABLE) ×1
KIT BASIN OR (CUSTOM PROCEDURE TRAY) ×2 IMPLANT
KIT PLEURX DRAIN CATH 1000ML (MISCELLANEOUS) IMPLANT
KIT PLEURX DRAIN CATH 15.5FR (DRAIN) ×2 IMPLANT
KIT TURNOVER KIT B (KITS) ×2 IMPLANT
NEEDLE 22X1 1/2 (OR ONLY) (NEEDLE) IMPLANT
NS IRRIG 1000ML POUR BTL (IV SOLUTION) ×2 IMPLANT
PACK CHEST (CUSTOM PROCEDURE TRAY) ×2 IMPLANT
PACK UNIVERSAL I (CUSTOM PROCEDURE TRAY) ×2 IMPLANT
PAD ARMBOARD 7.5X6 YLW CONV (MISCELLANEOUS) ×4 IMPLANT
SET DRAINAGE LINE (MISCELLANEOUS) IMPLANT
SUT ETHILON 3 0 FSL (SUTURE) ×2 IMPLANT
SUT SILK  1 MH (SUTURE) ×1
SUT SILK 1 MH (SUTURE) ×1 IMPLANT
SUT VIC AB 3-0 SH 27 (SUTURE) ×2
SUT VIC AB 3-0 SH 27X BRD (SUTURE) ×1 IMPLANT
SUT VICRYL 0 UR6 27IN ABS (SUTURE) ×2 IMPLANT
SYR CONTROL 10ML LL (SYRINGE) ×2 IMPLANT
SYSTEM SAHARA CHEST DRAIN ATS (WOUND CARE) ×2 IMPLANT
TAPE CLOTH SURG 4X10 WHT LF (GAUZE/BANDAGES/DRESSINGS) ×2 IMPLANT
TOWEL GREEN STERILE (TOWEL DISPOSABLE) ×2 IMPLANT
TOWEL GREEN STERILE FF (TOWEL DISPOSABLE) ×2 IMPLANT
TRAP SPECIMEN MUCUS 40CC (MISCELLANEOUS) ×4 IMPLANT
TROCAR XCEL BLADELESS 5X75MML (TROCAR) ×2 IMPLANT
TUBING LAP HI FLOW INSUFFLATIO (TUBING) ×2 IMPLANT
WATER STERILE IRR 1000ML POUR (IV SOLUTION) ×2 IMPLANT

## 2020-07-29 NOTE — Brief Op Note (Signed)
07/29/2020  9:37 AM  PATIENT:  Kaylee Romero  40 y.o. female  PRE-OPERATIVE DIAGNOSIS:  1. RECURRENT LEFT PLEURAL EFFUSION 2.HISTORY of BREAST CANCER  POST-OPERATIVE DIAGNOSIS:  1. RECURRENT LEFT PLEURAL EFFUSION 2.HISTORY of BREAST CANCER  PROCEDURE:  LEFT VIDEO ASSISTED THORACOSCOPY, DRAINAGE OF LEFT PLEURAL EFFUSION, INSERTION of LEFT PLEURAL DRAINAGE CATHETER, and LEFT PLEURAL BIOPSY   SURGEON:  Surgeon(s) and Role:    Lightfoot, Lucile Crater, MD - Primary  PHYSICIAN ASSISTANT: Lars Pinks PA-C  ANESTHESIA:   general  EBL:  Per anesthesia record   BLOOD ADMINISTERED:none  DRAINS:  60 Blake drain and a left Pleur X catheter placed in the left pleural space    LOCAL MEDICATIONS USED:  OTHER Exparel  SPECIMEN:  Source of Specimen:  Left pleural peel biopsy  DISPOSITION OF SPECIMEN:  PATHOLOGY. Frozen was positive for cancer  COUNTS CORRECT:  YES  DICTATION: .Dragon Dictation  PLAN OF CARE: Admit to inpatient   PATIENT DISPOSITION:  PACU - hemodynamically stable.   Delay start of Pharmacological VTE agent (>24hrs) due to surgical blood loss or risk of bleeding: no

## 2020-07-29 NOTE — Transfer of Care (Signed)
Immediate Anesthesia Transfer of Care Note  Patient: [name]  Procedure(s) Performed: VIDEO ASSISTED THORACOSCOPY (Left) DRAINAGE OF PLEURAL EFFUSION (Left) INSERTION PLEURAL DRAINAGE CATHETER (Left) PLEURAL BIOPSY (Left)  Patient Location: PACU  Anesthesia Type:General  Level of Consciousness: drowsy and patient cooperative  Airway & Oxygen Therapy: Patient Spontanous Breathing and Patient connected to face mask oxygen  Post-op Assessment: Report given to RN and Post -op Vital signs reviewed and stable  Post vital signs: Reviewed and stable  Last Vitals:  Vitals Value Taken Time  BP 109/97 07/29/20 1006  Temp    Pulse 85 07/29/20 1009  Resp 20 07/29/20 1009  SpO2 95 % 07/29/20 1009  Vitals shown include unvalidated device data.  Last Pain: There were no vitals filed for this visit.       Complications: No notable events documented.

## 2020-07-29 NOTE — Anesthesia Postprocedure Evaluation (Signed)
Anesthesia Post Note  Patient: Kaylee Romero  Procedure(s) Performed: VIDEO ASSISTED THORACOSCOPY (Left) DRAINAGE OF PLEURAL EFFUSION (Left) INSERTION PLEURAL DRAINAGE CATHETER (Left) PLEURAL BIOPSY (Left)     Patient location during evaluation: PACU Anesthesia Type: General Level of consciousness: awake Pain management: pain level controlled Vital Signs Assessment: post-procedure vital signs reviewed and stable Respiratory status: spontaneous breathing Cardiovascular status: stable Postop Assessment: no apparent nausea or vomiting Anesthetic complications: no   No notable events documented.  Last Vitals:  Vitals:   07/29/20 1036 07/29/20 1051  BP: 128/78 118/84  Pulse: 91 87  Resp: 16 14  Temp:    SpO2: 92% 94%    Last Pain:  Vitals:   07/29/20 1051  PainSc: Asleep                 Minah Axelrod

## 2020-07-29 NOTE — Discharge Summary (Addendum)
Physician Discharge Summary       [address]       [place] [address]             [phone]    Patient ID: VAISHNAVI KINDIG MRN: [id number] DOB/AGE: [birth year] 40 y.o.  Admit date: 07/29/2020 Discharge date: 07/31/2020  Admission Diagnoses: Recurrent left pleural effusion 2. History of right breast cancer  Discharge Diagnoses:  S/p left VATS, drain pleural effusion, left pleural biopsy, left Pleur X cathter History of anxiety History of HSV (herpes simplex virus) anogenital infection-positive titer only  Consults: None  Procedure (s):  -Left video assisted thoracoscopy, pleural biopsy, and insertion of Pleurx catheter by Dr. Kipp Brood on 07/29/2020.  Pathology: Final pathology pending  History of Presenting Illness: Kaylee Romero 40 y.o. female referred by Dr. Patsey Berthold for surgical evaluation for recurrent left-sided pleural effusion.  She does have a history of right-sided breast cancer that was treated with partial mastectomy, radiation, and tamoxifen therapy.  Over the last month she has had worsening shortness of breath and was noted to have a large pleural effusion.  She is undergone thoracentesis on 3 occasions the last of which was 07/19/2020.  From a respiratory standpoint she states that she is feeling better.  She does complain of a fullness along her left diaphragm and chest wall.  She also admits to night sweats.   Brief Hospital Course:  Patient was extubated and transported to PACU in stable condition. Chest tube was to suction and there was no air leak. CXR showed no pneumothorax, mild left pleural thickening/pleural effusion, and stable cardiomegaly. Patient has been ambulating on room air. She is tolerating a diet. Home health has been arranged to drain pleur X daily as well as patient teaching. Patient and husband instructed on how to drain Pleur X catheter. Of note, patient was kept one more day for pain control. Per Dr. Kipp Brood, left pleur X  to be drained daily beginning 08/01/2020.  Patient instructed to keep a daily record of output and bring this to her office appointment. All wounds are clean, dry, and healing without sign of infection. As discussed with Dr. Kipp Brood, patient is felt surgically stable for discharge today.   Latest Vital Signs: Blood pressure 104/60, pulse 98, temperature 98.8 F (37.1 C), temperature source Oral, resp. rate 17, height '5\' 4"'$  (1.626 m), weight 76.5 kg, SpO2 95 %.  Physical Exam: Cardiovascular: RRR Pulmonary: Clear to auscultation on the right and slightly diminished left base Abdomen: Soft, non tender, bowel sounds present. Extremities: SCDs in place Wounds: Clean and dry.  No erythema or signs of infection.  Discharge Condition:Stable and discharged to home.  Recent laboratory studies:  Lab Results  Component Value Date   WBC 13.5 (H) 07/30/2020   HGB 11.7 (L) 07/30/2020   HCT 34.9 (L) 07/30/2020   MCV 94.6 07/30/2020   PLT 290 07/30/2020   Lab Results  Component Value Date   NA 136 07/31/2020   K 4.3 07/31/2020   CL 102 07/31/2020   CO2 28 07/31/2020   CREATININE 0.54 07/31/2020   GLUCOSE 102 (H) 07/31/2020     Diagnostic Studies: DG Chest 2 View  Result Date: 07/29/2020 CLINICAL DATA:  Preop for pleural effusion EXAM: CHEST - 2 VIEW COMPARISON:  07/22/2020 FINDINGS: Similar degree of moderate left pleural effusion recently evaluated by CT and PET. Normal heart size and mediastinal contours. Postoperative right axilla. IMPRESSION: Unchanged moderate left pleural effusion. Electronically Signed   By: Angelica Chessman  Watts M.D.   On: 07/29/2020 06:30   DG Chest 2 View  Result Date: 07/22/2020 CLINICAL DATA:  Pleural effusion EXAM: CHEST - 2 VIEW COMPARISON:  07/19/2020, PET CT 07/18/2020 FINDINGS: Moderate left pleural effusion with probable loculation. Airspace disease at the left base as before. Normal cardiomediastinal silhouette. No pneumothorax. IMPRESSION: No significant  change in moderate left pleural effusion with probable loculation. Electronically Signed   By: Donavan Foil M.D.   On: 07/22/2020 15:40   CT CHEST W CONTRAST  Result Date: 07/03/2020 CLINICAL DATA:  Left pleural effusion. Personal history of breast carcinoma. EXAM: CT CHEST WITH CONTRAST TECHNIQUE: Multidetector CT imaging of the chest was performed during intravenous contrast administration. CONTRAST:  16m OMNIPAQUE IOHEXOL 300 MG/ML  SOLN COMPARISON:  None. FINDINGS: Cardiovascular:  No acute findings. Mediastinum/Nodes: No pathologically enlarged lymph nodes identified. Ill-defined soft tissue density in the anterior mediastinum has a triangular shape, and is most consistent with thymic hyperplasia. Surgical clips are noted in the right axilla, however there is no evidence of axillary lymphadenopathy. Lungs/Pleura: A moderate multiloculated left pleural effusion is seen. Mild pleural thickening and enhancement is seen throughout the left pleural space, suspicious for a malignant effusion. Compressive atelectasis is seen in the left mid and lower lung which limits evaluation, however no definite left lung nodules or masses are identified. A 7 mm smoothly large dated pulmonary nodule is seen in the posterior right lower lobe on image 84/3. Two other tiny sub pleural nodules are seen in the lateral right upper lobe and anterior right middle lobe measuring up to 4 mm. No evidence of right-sided pleural effusion. Upper Abdomen:  Unremarkable. Musculoskeletal:  No suspicious bone lesions. IMPRESSION: Moderate multiloculated left pleural effusion with mild pleural thickening and enhancement, suspicious for malignant pleural effusion. Several right lung nodules, largest measuring 7 mm in right lower lobe. Pulmonary metastases cannot be excluded. 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 Electronically Signed   By: JMarlaine HindM.D.   On: 07/03/2020 15:17   NM PET Image Initial (PI) Skull Base To  Thigh  Result Date: 07/19/2020 CLINICAL DATA:  Initial treatment strategy for loculated effusion and nodularity of the pleural surface in a patient with history of breast cancer. EXAM: NUCLEAR MEDICINE PET SKULL BASE TO THIGH TECHNIQUE: 8.8 mCi F-18 FDG was injected intravenously. Full-ring PET imaging was performed from the skull base to thigh after the radiotracer. CT data was obtained and used for attenuation correction and anatomic localization. Fasting blood glucose: 84 mg/dl COMPARISON:  Chest CT July 03, 2020. FINDINGS: Mediastinal blood pool activity: SUV max 1.97 Liver activity: SUV max NA NECK: No hypermetabolic lymph nodes in the neck. Incidental CT findings: none CHEST: Circumferential pleural nodularity associated with the loculated pleural effusion that was seen on previous chest imaging. This involves the entire circumference of the pleura and shows a maximum SUV in the range of 9.5 posteriorly along the inferior chest and 13.1 anteriorly along the inferior chest measured on image 123 of series 3. Circumferential pleural thickening is more pronounced in the costodiaphragmatic sulci better demonstrated on the contrasted CT. Nodularity extends along the LEFT mediastinal border (image 86/3). Measures up to 7 mm greatest thickness and shows a maximum SUV in the range of 8.2 LEFT retropectoral lymph node (image 215/6, 76/3 with a maximum SUV of 2.6, this measures less than a cm and there are small retropectoral lymph nodes on the LEFT and a small LEFT supraclavicular lymph node as well on image 66/3 that measures  7 mm showing a maximum SUV of 2.2 Signs of RIGHT axillary dissection with a maximum SUV of 1.8 and some soft tissue surrounding surgical clips in this area measuring 1.8 x 1.1 cm (image 85/3) RIGHT retrocrural lymph node (image 130/3) maximum SUV of 5.3 5 mm. No discrete mediastinal adenopathy. Some uptake about the LEFT hilum is contiguous with pleural thickening. 6 mm pulmonary nodule in the  RIGHT upper lobe (image 89/3) this is not associated with increased FDG uptake. Well-circumscribed RIGHT lower lobe pulmonary nodule (image 110/3) 7 mm maximum SUV of 1.1 Fissural nodularity in the LEFT mid chest (image 93/3) 11 mm with an X min SUV of 5.6 Incidental CT findings: Normal caliber of the thoracic aorta. Normal heart size. Pericardial thickening and thickening along the LEFT mediastinal border. Is normal caliber of the central pulmonary vessels. Limited assessment of cardiovascular structures given lack of intravenous contrast. Loculated LEFT effusion of similar volume to previous imaging. ABDOMEN/PELVIS: Heterogeneous uptake in the liver without discrete CT correlate. More focal area of uptake in hepatic subsegment VIII is equivocal with a maximum SUV of 3.6 (image [id number]) this is of uncertain significance given the heterogeneity seen with respect to hepatic uptake in general. No signs of visceral or nodal disease in the abdomen Incidental CT findings: No acute findings relative liver, gallbladder, spleen, pancreas, adrenal glands or kidneys. The urinary bladder is collapsed. No signs of bowel obstruction. Appendix is normal. IUD in place. No intra-abdominal ascites. SKELETON: No focal hypermetabolic activity to suggest skeletal metastasis. Incidental CT findings: none IMPRESSION: Signs of circumferential pleural disease most likely related to metastatic disease from breast cancer given patient history. This pattern can also be seen in the setting of mesothelioma and is associated with a loculated LEFT pleural effusion. Retrocrural lymph node with definite involvement. Nodules in the RIGHT chest without increased metabolic activity dominant nodule in the RIGHT lower lobe. Suspicious given other findings, attention on follow-up. Equivocal retropectoral and supraclavicular lymph nodes, attention on follow-up. Heterogeneous uptake pattern in the liver. Slightly more pronounced area in the posterior RIGHT  hemi liver is equivocal. Could consider hepatic MRI for further assessment with and without contrast. Eovist contrast is often helpful in this setting. Electronically Signed   By: Zetta Bills M.D.   On: 07/19/2020 15:37   DG CHEST PORT 1 VIEW  Result Date: 07/30/2020 CLINICAL DATA:  Chest tube. EXAM: PORTABLE CHEST 1 VIEW COMPARISON:  07/29/2020. FINDINGS: Left chest tubes in stable position. No pneumothorax. Stable mild left pleural thickening/pleural effusion. Low lung volumes with persistent bibasilar atelectasis. Stable cardiomegaly. No acute bony abnormality. IMPRESSION: 1. Left chest tube in stable position. No pneumothorax. Stable mild left pleural thickening/pleural effusion. 2.  Low lung volumes with persistent bibasilar atelectasis. 3.  Stable cardiomegaly. Electronically Signed   By: Marcello Moores  Register   On: 07/30/2020 07:38   DG Chest Port 1 View  Result Date: 07/29/2020 CLINICAL DATA:  Status post pleural drainage catheter placement. EXAM: PORTABLE CHEST 1 VIEW COMPARISON:  Same day. FINDINGS: Stable cardiomegaly. Interval placement of left-sided chest tube. Left pleural effusion is significantly smaller. Right lung is clear. IMPRESSION: Left pleural effusion is significantly smaller status post left-sided chest tube placement. Electronically Signed   By: Marijo Conception M.D.   On: 07/29/2020 11:55   DG Chest Port 1 View  Result Date: 07/19/2020 CLINICAL DATA:  Post left-sided thoracentesis. EXAM: PORTABLE CHEST 1 VIEW COMPARISON:  07/01/2020; chest CT-07/03/2020; PET-CT-07/18/2020 FINDINGS: Grossly unchanged cardiac silhouette and mediastinal contours. Interval  reduction in persistent moderate-sized partially loculated left-sided effusion post thoracentesis. No pneumothorax. Residual left basilar heterogeneous/consolidative opacities, unchanged. The right hemithorax remains well aerated. No new focal airspace opacities. No evidence of edema. No acute osseous abnormalities. Surgical clips  overlie the upper outer quadrant of the right breast. IMPRESSION: 1. Slight reduction in persistent moderate sized partially loculated left-sided effusion post thoracentesis. No pneumothorax. 2. Unchanged left basilar heterogeneous/consolidative opacities, likely atelectasis. Electronically Signed   By: Sandi Mariscal M.D.   On: 07/19/2020 15:20   US THORACENTESIS ASP PLEURAL SPACE W/IMG GUIDE  Result Date: 07/19/2020 INDICATION: Recurrent symptomatic left-sided pleural effusion. Please perform ultrasound-guided thoracentesis for diagnostic and therapeutic purposes. EXAM: US THORACENTESIS ASP PLEURAL SPACE W/IMG GUIDE COMPARISON:  PET-CT-07/18/2020; chest CT-07/03/2020; ultrasound-guided left-sided thoracentesis-07/03/2020 (yielding 700 cc of pleural fluid). MEDICATIONS: None. COMPLICATIONS: None immediate. TECHNIQUE: Informed written consent was obtained from the patient after a discussion of the risks, benefits and alternatives to treatment. A timeout was performed prior to the initiation of the procedure. Initial ultrasound scanning demonstrates a moderate sized recurrent left-sided pleural effusion the basilar component of which is noted to be extensively loculated. An adequate percutaneous window was identified at the level of the left mid lateral chest and the procedure was planned. The chest was prepped and draped in the usual sterile fashion. 1% lidocaine was used for local anesthesia. An ultrasound image was saved for documentation purposes. An 8 Fr Safe-T-Centesis catheter was introduced. The thoracentesis was performed. The catheter was removed and a dressing was applied. The patient tolerated the procedure well without immediate post procedural complication. The patient was escorted to have an upright chest radiograph. FINDINGS: A total of approximately 275 cc of serous fluid was removed. Requested samples were sent to the laboratory. IMPRESSION: Successful ultrasound-guided left sided thoracentesis  yielding 275 cc of pleural fluid. Note, an incomplete thoracentesis was performed secondary to extensive loculations within the pleural fluid. Electronically Signed   By: Sandi Mariscal M.D.   On: 07/19/2020 15:35   US THORACENTESIS ASP PLEURAL SPACE W/IMG GUIDE  Result Date: 07/03/2020 INDICATION: 40 year old with history of right breast cancer. Recently found to have a large left pleural effusion and underwent a left thoracentesis at a different facility. Patient presents for another diagnostic and therapeutic thoracentesis. EXAM: ULTRASOUND GUIDED LEFT THORACENTESIS MEDICATIONS: None. COMPLICATIONS: None immediate. PROCEDURE: An ultrasound guided thoracentesis was thoroughly discussed with the patient and questions answered. The benefits, risks, alternatives and complications were also discussed. The patient understands and wishes to proceed with the procedure. Written consent was obtained. Ultrasound was performed to localize and mark an adequate pocket of fluid in the left chest. The area was then prepped and draped in the normal sterile fashion. 1% Lidocaine was used for local anesthesia. Under ultrasound guidance a 6 Fr Safe-T-Centesis catheter was introduced. Thoracentesis was performed. The catheter was removed and a dressing applied. FINDINGS: A total of approximately 700 mL of amber colored fluid was removed. Samples were sent to the laboratory as requested by the clinical team. IMPRESSION: Successful ultrasound guided left thoracentesis yielding 700 mL of pleural fluid. Electronically Signed   By: Markus Daft M.D.   On: 07/03/2020 14:57      Discharge Medications: Allergies as of 07/31/2020       Reactions   Betadine [povidone-iodine]         Medication List     TAKE these medications    ALPRAZolam 0.5 MG tablet Commonly known as: XANAX Take 0.5 mg by mouth at bedtime  as needed for anxiety.   amphetamine-dextroamphetamine 30 MG 24 hr capsule Commonly known as: ADDERALL XR Take 30  mg by mouth daily.   amphetamine-dextroamphetamine 10 MG tablet Commonly known as: ADDERALL Take 10 mg by mouth daily in the afternoon.   HYDROcodone-acetaminophen 5-325 MG tablet Commonly known as: NORCO/VICODIN Take 1 tablet by mouth every 6 (six) hours as needed for moderate pain.   hydrOXYzine 10 MG tablet Commonly known as: ATARAX/VISTARIL Take 10 mg by mouth at bedtime as needed for anxiety or itching (Sleep).   ibuprofen 200 MG tablet Commonly known as: ADVIL Take 400 mg by mouth every 6 (six) hours as needed for mild pain.   multivitamin with minerals Tabs tablet Take 1 tablet by mouth daily.   naproxen sodium 220 MG tablet Commonly known as: Aleve Take 1 tablet (220 mg total) by mouth 3 (three) times daily with meals.   tacrolimus 0.1 % ointment Commonly known as: PROTOPIC Apply 1 application topically daily. 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 Continue taking this medication, and follow the directions you see here.   tamoxifen 20 MG tablet Commonly known as: NOLVADEX Take 20 mg by mouth at bedtime.   venlafaxine XR 75 MG 24 hr capsule Commonly known as: EFFEXOR-XR Take 1 capsule (75 mg total) by mouth at bedtime. What changed:  how much to take when to take this Another medication with the same name was removed. Continue taking this medication, and follow the directions you see here.   VISINE OP Place 1 drop into both eyes daily as needed (redness).   vitamin B-12 1000 MCG tablet Commonly known as: CYANOCOBALAMIN Take 1,000 mcg by mouth daily.   Vitamin C 500 MG Caps Take 500 mg by mouth daily.        Follow Up Appointments:  Follow-up Information     Lajuana Matte, MD. Go on 08/02/2020.   Specialty: Cardiothoracic Surgery Contact information: [address] [place] [address] [place],  [facility] Follow up.   Specialty: Home Health Services Why: [facility], Contact information: [address] [place] [address] [phone]                 Signed: Nani Skillern PA-C 07/31/2020, 1:52 PM

## 2020-07-29 NOTE — Discharge Instructions (Addendum)
Daily Pleurix drainage to start 07/28 If <150 ml /drainage session x3 consecutive occasions - QOD drainage If <150 ml /drainage session x3 consecutive occasions on  QOD drainage schedule- call [facility] office  [phone]) for evaluation and possible removal  See Indwelling Pleural Catheter Home Guide  ACTIVITY:  1.Increase activity slowly. 2.Walk daily and increase frequency and duration as tolerates. 3.May walk up steps. 4.No lifting more than ten pounds for two weeks. 5.No driving for two weeks. 6.Avoid straining. 7.STOP any activity that causes chest pain, shortness of breath, dizziness,sweating,     or excessive weakness. 8.Continue with breathing exercises daily.   WOUND:  1.May shower. If Pleur X dressing is wrinkled or is open, please apply new dressing prior to showering.  2.Clean wounds with mild soap and water.  Call the office at [phone] if any problems arise.

## 2020-07-29 NOTE — Interval H&P Note (Signed)
History and Physical Interval Note:  07/29/2020 7:38 AM  Kaylee Romero  has presented today for surgery, with the diagnosis of PLEURAL EFFUSION.  The various methods of treatment have been discussed with the patient and family. After consideration of risks, benefits and other options for treatment, the patient has consented to  Procedure(s): VIDEO ASSISTED THORACOSCOPY (Left) DRAINAGE OF PLEURAL EFFUSION (Left) INSERTION PLEURAL DRAINAGE CATHETER (Left) PLEURAL BIOPSY (Left) as a surgical intervention.  The patient's history has been reviewed, patient examined, no change in status, stable for surgery.  I have reviewed the patient's chart and labs.  Questions were answered to the patient's satisfaction.     Jeff Frieden Bary Leriche

## 2020-07-29 NOTE — Progress Notes (Deleted)
[place]  [phone]) [phone] Fax:(336) [phone]  ID: MAIKO SALAIS OB: Jul 02, 1980  MR#: [id number]  YIA#:[id number]  Patient Care Team: Dion Body, MD as PCP - General (Family Medicine) Gery Pray, MD as Consulting Physician (Radiation Oncology) Magrinat, Virgie Dad, MD as Consulting Physician (Oncology) Jovita Kussmaul, MD as Consulting Physician (General Surgery) Dillingham, Loel Lofty, DO as Attending Physician (Plastic Surgery) Noreene Filbert, MD as Radiation Oncologist (Radiation Oncology)  CHIEF COMPLAINT: Breast cancer  INTERVAL HISTORY: ***  REVIEW OF SYSTEMS:   ROS  As per HPI. Otherwise, a complete review of systems is negative.  PAST MEDICAL HISTORY: Past Medical History:  Diagnosis Date   Anxiety    Family history of breast cancer    HSV (herpes simplex virus) anogenital infection    positive titer only    PAST SURGICAL HISTORY: Past Surgical History:  Procedure Laterality Date   BREAST LUMPECTOMY WITH AXILLARY LYMPH NODE BIOPSY Right 11/21/2018   Procedure: RIGHT BREAST LUMPECTOMY WITH SENTINEL LYMPH NODE BIOPSY;  Surgeon: Jovita Kussmaul, MD;  Location: [facility];  Service: General;  Laterality: Right;   BREAST REDUCTION SURGERY Bilateral 11/21/2018   Procedure: BILATERAL MAMMARY REDUCTION  (BREAST);  Surgeon: Wallace Going, DO;  Location: [facility];  Service: Plastics;  Laterality: Bilateral;   PORT-A-CATH REMOVAL N/A 07/06/2019   Procedure: REMOVAL PORT-A-CATH;  Surgeon: Jovita Kussmaul, MD;  Location: [facility];  Service: General;  Laterality: N/A;   PORTACATH PLACEMENT N/A 11/21/2018   Procedure: INSERTION LEFT PORT-A-CATH WITH ULTRASOUND GUIDANCE;  Surgeon: Jovita Kussmaul, MD;  Location: [facility];  Service: General;  Laterality: N/A;   TONSILLECTOMY  1987   TONSILLECTOMY     WISDOM TOOTH EXTRACTION      FAMILY HISTORY: Family History  Problem Relation Age of Onset   Hypertension Father    Alcohol abuse  Paternal Grandfather    Heart disease Paternal Grandfather    Alcohol abuse Maternal Grandfather    Heart disease Maternal Grandmother    Breast cancer Other     ADVANCED DIRECTIVES (Y/N):  N  HEALTH MAINTENANCE: Social History   Tobacco Use   Smoking status: Former    Packs/day: 0.50    Years: 20.00    Pack years: 10.00    Types: Cigarettes    Start date: 01/13/2002    Quit date: 10/17/2016    Years since quitting: 3.7   Smokeless tobacco: Never  Vaping Use   Vaping Use: Never used  Substance Use Topics   Alcohol use: Yes    Comment: Socially   Drug use: No     Colonoscopy:  PAP:  Bone density:  Lipid panel:  Allergies  Allergen Reactions   Betadine [Povidone-Iodine]     No current facility-administered medications for this visit.   Current Outpatient Medications  Medication Sig Dispense Refill   [START ON 07/31/2020] naproxen sodium (ALEVE) 220 MG tablet Take 1 tablet (220 mg total) by mouth 3 (three) times daily with meals. 60 tablet 3   tacrolimus (PROTOPIC) 0.1 % ointment Apply 1 application topically daily. 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     Facility-Administered Medications Ordered in Other Visits  Medication Dose Route Frequency Provider Last Rate Last Admin   acetaminophen (TYLENOL) 500 MG tablet            ALPRAZolam (XANAX) tablet  0.5 mg  0.5 mg Oral BID PRN Nani Skillern, PA-C   0.5 mg at 07/29/20 2159   [START ON 07/30/2020] amphetamine-dextroamphetamine (ADDERALL XR) 24 hr capsule 30 mg  30 mg Oral Daily Lars Pinks M, [place]       [START ON 07/30/2020] amphetamine-dextroamphetamine (ADDERALL) tablet 10 mg  10 mg Oral [id number] Lars Pinks M, PA-C       bisacodyl (DULCOLAX) EC tablet 10 mg  10 mg Oral Daily Lars Pinks M, PA-C   10 mg at 07/29/20 1816   ceFAZolin (ANCEF) 2-4 GM/100ML-% IVPB             enoxaparin (LOVENOX) injection 40 mg  40 mg Subcutaneous Q24H Zimmerman, Donielle M, PA-C       fentaNYL (SUBLIMAZE) 100 MCG/2ML injection            fentaNYL (SUBLIMAZE) 100 MCG/2ML injection            fentaNYL (SUBLIMAZE) 100 MCG/2ML injection            fentaNYL (SUBLIMAZE) 100 MCG/2ML injection            fentaNYL (SUBLIMAZE) 100 MCG/2ML injection            hydrOXYzine (ATARAX/VISTARIL) tablet 10 mg  10 mg Oral QHS PRN Lars Pinks M, PA-C   10 mg at 07/29/20 2159   ketorolac (TORADOL) 30 MG/ML injection 30 mg  30 mg Intravenous Q6H Lars Pinks M, PA-C   30 mg at 07/29/20 1816   ondansetron (ZOFRAN) injection 4 mg  4 mg Intravenous Q6H PRN Lars Pinks M, PA-C       senna-docusate (Senokot-S) tablet 1 tablet  1 tablet Oral QHS Lars Pinks M, PA-C       sodium chloride flush (NS) 0.9 % injection 10 mL  10 mL Intracatheter PRN Magrinat, Virgie Dad, MD   10 mL at 05/18/19 1553   tacrolimus (PROTOPIC) 0.1 % ointment 1 application  1 application Topical Daily Lars Pinks M, PA-C       tamoxifen (NOLVADEX) tablet 20 mg  20 mg Oral QHS Lars Pinks M, PA-C   20 mg at 07/29/20 2159   traMADol (ULTRAM) 50 MG tablet            traMADol (ULTRAM) 50 MG tablet            venlafaxine XR (EFFEXOR-XR) 24 hr capsule 75 mg  75 mg Oral QHS Lars Pinks M, PA-C   75 mg at 07/29/20 2159    OBJECTIVE: There were no vitals filed for this visit.   There is no height or weight on file to calculate BMI.    ECOG FS:{CHL ONC [id number]  General: Well-developed, well-nourished, no acute distress. Eyes: Pink conjunctiva, anicteric sclera. HEENT: Normocephalic, moist mucous membranes. Lungs: No audible wheezing or coughing. Heart: Regular rate and rhythm. Abdomen: Soft, nontender, no obvious distention. Musculoskeletal: No edema, cyanosis, or clubbing. Neuro: Alert, answering all questions appropriately. Cranial nerves grossly intact. Skin: No rashes or  petechiae noted. Psych: Normal affect. Lymphatics: No cervical, calvicular, axillary or inguinal LAD.   LAB RESULTS:  Lab Results  Component Value Date   NA 139 07/25/2020   K 4.5 07/25/2020   CL 105 07/25/2020   CO2 26 07/25/2020   GLUCOSE 89 07/25/2020   BUN 9 07/25/2020   CREATININE 0.57 07/25/2020   CALCIUM 9.7 07/25/2020   PROT 7.0 07/25/2020   ALBUMIN 3.4 (L) 07/25/2020   AST 35 07/25/2020   ALT 23  07/25/2020   ALKPHOS 47 07/25/2020   BILITOT 0.4 07/25/2020   GFRNONAA >60 07/25/2020   GFRAA >60 08/07/2019    Lab Results  Component Value Date   WBC 8.4 07/25/2020   NEUTROABS 4.7 04/02/2020   HGB 13.1 07/25/2020   HCT 39.8 07/25/2020   MCV 93.2 07/25/2020   PLT 307 07/25/2020     STUDIES: DG Chest 2 View  Result Date: 07/29/2020 CLINICAL DATA:  Preop for pleural effusion EXAM: CHEST - 2 VIEW COMPARISON:  07/22/2020 FINDINGS: Similar degree of moderate left pleural effusion recently evaluated by CT and PET. Normal heart size and mediastinal contours. Postoperative right axilla. IMPRESSION: Unchanged moderate left pleural effusion. Electronically Signed   By: Monte Fantasia M.D.   On: 07/29/2020 06:30   DG Chest 2 View  Result Date: 07/22/2020 CLINICAL DATA:  Pleural effusion EXAM: CHEST - 2 VIEW COMPARISON:  07/19/2020, PET CT 07/18/2020 FINDINGS: Moderate left pleural effusion with probable loculation. Airspace disease at the left base as before. Normal cardiomediastinal silhouette. No pneumothorax. IMPRESSION: No significant change in moderate left pleural effusion with probable loculation. Electronically Signed   By: Donavan Foil M.D.   On: 07/22/2020 15:40   CT CHEST W CONTRAST  Result Date: 07/03/2020 CLINICAL DATA:  Left pleural effusion. Personal history of breast carcinoma. EXAM: CT CHEST WITH CONTRAST TECHNIQUE: Multidetector CT imaging of the chest was performed during intravenous contrast administration. CONTRAST:  11m OMNIPAQUE IOHEXOL 300 MG/ML   SOLN COMPARISON:  None. FINDINGS: Cardiovascular:  No acute findings. Mediastinum/Nodes: No pathologically enlarged lymph nodes identified. Ill-defined soft tissue density in the anterior mediastinum has a triangular shape, and is most consistent with thymic hyperplasia. Surgical clips are noted in the right axilla, however there is no evidence of axillary lymphadenopathy. Lungs/Pleura: A moderate multiloculated left pleural effusion is seen. Mild pleural thickening and enhancement is seen throughout the left pleural space, suspicious for a malignant effusion. Compressive atelectasis is seen in the left mid and lower lung which limits evaluation, however no definite left lung nodules or masses are identified. A 7 mm smoothly large dated pulmonary nodule is seen in the posterior right lower lobe on image 84/3. Two other tiny sub pleural nodules are seen in the lateral right upper lobe and anterior right middle lobe measuring up to 4 mm. No evidence of right-sided pleural effusion. Upper Abdomen:  Unremarkable. Musculoskeletal:  No suspicious bone lesions. IMPRESSION: Moderate multiloculated left pleural effusion with mild pleural thickening and enhancement, suspicious for malignant pleural effusion. Several right lung nodules, largest measuring 7 mm in right lower lobe. Pulmonary metastases cannot be excluded. 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 Electronically Signed   By: JMarlaine HindM.D.   On: 07/03/2020 15:17   NM PET Image Initial (PI) Skull Base To Thigh  Result Date: 07/19/2020 CLINICAL DATA:  Initial treatment strategy for loculated effusion and nodularity of the pleural surface in a patient with history of breast cancer. EXAM: NUCLEAR MEDICINE PET SKULL BASE TO THIGH TECHNIQUE: 8.8 mCi F-18 FDG was injected intravenously. Full-ring PET imaging was performed from the skull base to thigh after the radiotracer. CT data was obtained and used for attenuation correction and anatomic  localization. Fasting blood glucose: 84 mg/dl COMPARISON:  Chest CT July 03, 2020. FINDINGS: Mediastinal blood pool activity: SUV max 1.97 Liver activity: SUV max NA NECK: No hypermetabolic lymph nodes in the neck. Incidental CT findings: none CHEST: Circumferential pleural nodularity associated with the loculated pleural effusion that was seen on previous  chest imaging. This involves the entire circumference of the pleura and shows a maximum SUV in the range of 9.5 posteriorly along the inferior chest and 13.1 anteriorly along the inferior chest measured on image 123 of series 3. Circumferential pleural thickening is more pronounced in the costodiaphragmatic sulci better demonstrated on the contrasted CT. Nodularity extends along the LEFT mediastinal border (image 86/3). Measures up to 7 mm greatest thickness and shows a maximum SUV in the range of 8.2 LEFT retropectoral lymph node (image 215/6, 76/3 with a maximum SUV of 2.6, this measures less than a cm and there are small retropectoral lymph nodes on the LEFT and a small LEFT supraclavicular lymph node as well on image 66/3 that measures 7 mm showing a maximum SUV of 2.2 Signs of RIGHT axillary dissection with a maximum SUV of 1.8 and some soft tissue surrounding surgical clips in this area measuring 1.8 x 1.1 cm (image 85/3) RIGHT retrocrural lymph node (image 130/3) maximum SUV of 5.3 5 mm. No discrete mediastinal adenopathy. Some uptake about the LEFT hilum is contiguous with pleural thickening. 6 mm pulmonary nodule in the RIGHT upper lobe (image 89/3) this is not associated with increased FDG uptake. Well-circumscribed RIGHT lower lobe pulmonary nodule (image 110/3) 7 mm maximum SUV of 1.1 Fissural nodularity in the LEFT mid chest (image 93/3) 11 mm with an X min SUV of 5.6 Incidental CT findings: Normal caliber of the thoracic aorta. Normal heart size. Pericardial thickening and thickening along the LEFT mediastinal border. Is normal caliber of the central  pulmonary vessels. Limited assessment of cardiovascular structures given lack of intravenous contrast. Loculated LEFT effusion of similar volume to previous imaging. ABDOMEN/PELVIS: Heterogeneous uptake in the liver without discrete CT correlate. More focal area of uptake in hepatic subsegment VIII is equivocal with a maximum SUV of 3.6 (image 937/3) this is of uncertain significance given the heterogeneity seen with respect to hepatic uptake in general. No signs of visceral or nodal disease in the abdomen Incidental CT findings: No acute findings relative liver, gallbladder, spleen, pancreas, adrenal glands or kidneys. The urinary bladder is collapsed. No signs of bowel obstruction. Appendix is normal. IUD in place. No intra-abdominal ascites. SKELETON: No focal hypermetabolic activity to suggest skeletal metastasis. Incidental CT findings: none IMPRESSION: Signs of circumferential pleural disease most likely related to metastatic disease from breast cancer given patient history. This pattern can also be seen in the setting of mesothelioma and is associated with a loculated LEFT pleural effusion. Retrocrural lymph node with definite involvement. Nodules in the RIGHT chest without increased metabolic activity dominant nodule in the RIGHT lower lobe. Suspicious given other findings, attention on follow-up. Equivocal retropectoral and supraclavicular lymph nodes, attention on follow-up. Heterogeneous uptake pattern in the liver. Slightly more pronounced area in the posterior RIGHT hemi liver is equivocal. Could consider hepatic MRI for further assessment with and without contrast. Eovist contrast is often helpful in this setting. Electronically Signed   By: Zetta Bills M.D.   On: 07/19/2020 15:37   DG Chest Port 1 View  Result Date: 07/29/2020 CLINICAL DATA:  Status post pleural drainage catheter placement. EXAM: PORTABLE CHEST 1 VIEW COMPARISON:  Same day. FINDINGS: Stable cardiomegaly. Interval placement of  left-sided chest tube. Left pleural effusion is significantly smaller. Right lung is clear. IMPRESSION: Left pleural effusion is significantly smaller status post left-sided chest tube placement. Electronically Signed   By: Marijo Conception M.D.   On: 07/29/2020 11:55   DG Chest [facility]  Result Date: 07/19/2020 CLINICAL DATA:  Post left-sided thoracentesis. EXAM: PORTABLE CHEST 1 VIEW COMPARISON:  07/01/2020; chest CT-07/03/2020; PET-CT-07/18/2020 FINDINGS: Grossly unchanged cardiac silhouette and mediastinal contours. Interval reduction in persistent moderate-sized partially loculated left-sided effusion post thoracentesis. No pneumothorax. Residual left basilar heterogeneous/consolidative opacities, unchanged. The right hemithorax remains well aerated. No new focal airspace opacities. No evidence of edema. No acute osseous abnormalities. Surgical clips overlie the upper outer quadrant of the right breast. IMPRESSION: 1. Slight reduction in persistent moderate sized partially loculated left-sided effusion post thoracentesis. No pneumothorax. 2. Unchanged left basilar heterogeneous/consolidative opacities, likely atelectasis. Electronically Signed   By: Sandi Mariscal M.D.   On: 07/19/2020 15:20   US THORACENTESIS ASP PLEURAL SPACE W/IMG GUIDE  Result Date: 07/19/2020 INDICATION: Recurrent symptomatic left-sided pleural effusion. Please perform ultrasound-guided thoracentesis for diagnostic and therapeutic purposes. EXAM: US THORACENTESIS ASP PLEURAL SPACE W/IMG GUIDE COMPARISON:  PET-CT-07/18/2020; chest CT-07/03/2020; ultrasound-guided left-sided thoracentesis-07/03/2020 (yielding 700 cc of pleural fluid). MEDICATIONS: None. COMPLICATIONS: None immediate. TECHNIQUE: Informed written consent was obtained from the patient after a discussion of the risks, benefits and alternatives to treatment. A timeout was performed prior to the initiation of the procedure. Initial ultrasound scanning demonstrates a  moderate sized recurrent left-sided pleural effusion the basilar component of which is noted to be extensively loculated. An adequate percutaneous window was identified at the level of the left mid lateral chest and the procedure was planned. The chest was prepped and draped in the usual sterile fashion. 1% lidocaine was used for local anesthesia. An ultrasound image was saved for documentation purposes. An 8 Fr Safe-T-Centesis catheter was introduced. The thoracentesis was performed. The catheter was removed and a dressing was applied. The patient tolerated the procedure well without immediate post procedural complication. The patient was escorted to have an upright chest radiograph. FINDINGS: A total of approximately 275 cc of serous fluid was removed. Requested samples were sent to the laboratory. IMPRESSION: Successful ultrasound-guided left sided thoracentesis yielding 275 cc of pleural fluid. Note, an incomplete thoracentesis was performed secondary to extensive loculations within the pleural fluid. Electronically Signed   By: Sandi Mariscal M.D.   On: 07/19/2020 15:35   US THORACENTESIS ASP PLEURAL SPACE W/IMG GUIDE  Result Date: 07/03/2020 INDICATION: 40 year old with history of right breast cancer. Recently found to have a large left pleural effusion and underwent a left thoracentesis at a different facility. Patient presents for another diagnostic and therapeutic thoracentesis. EXAM: ULTRASOUND GUIDED LEFT THORACENTESIS MEDICATIONS: None. COMPLICATIONS: None immediate. PROCEDURE: An ultrasound guided thoracentesis was thoroughly discussed with the patient and questions answered. The benefits, risks, alternatives and complications were also discussed. The patient understands and wishes to proceed with the procedure. Written consent was obtained. Ultrasound was performed to localize and mark an adequate pocket of fluid in the left chest. The area was then prepped and draped in the normal sterile fashion. 1%  Lidocaine was used for local anesthesia. Under ultrasound guidance a 6 Fr Safe-T-Centesis catheter was introduced. Thoracentesis was performed. The catheter was removed and a dressing applied. FINDINGS: A total of approximately 700 mL of amber colored fluid was removed. Samples were sent to the laboratory as requested by the clinical team. IMPRESSION: Successful ultrasound guided left thoracentesis yielding 700 mL of pleural fluid. Electronically Signed   By: Markus Daft M.D.   On: 07/03/2020 14:57    ASSESSMENT: Breast cancer  PLAN:    Breast cancer:  Patient expressed understanding and was in agreement with this plan. She also understands that  She can call clinic at any time with any questions, concerns, or complaints.   Cancer Staging Malignant neoplasm of upper-outer quadrant of right breast in female, estrogen receptor positive ([place]) Staging form: Breast, AJCC 8th Edition - Clinical stage from 10/05/2018: Stage IB (cT2, cN0, cM0, G2, ER+, PR+, HER2-) - Signed by Chauncey Cruel, MD on 04/02/2020 Stage prefix: Initial diagnosis Histologic grading system: 3 grade system - Pathologic: Stage IB (pT2, pN1a, cM0, G2, ER+, PR+, HER2-) - Signed by Gardenia Phlegm, NP on 11/12/2019 Histologic grading system: 3 grade system   Lloyd Huger, MD   07/29/2020 10:14 PM

## 2020-07-29 NOTE — Anesthesia Procedure Notes (Signed)
Procedure Name: Intubation Date/Time: 07/29/2020 8:04 AM Performed by: Thelma Comp, CRNA Pre-anesthesia Checklist: Patient identified, Emergency Drugs available, Suction available and Patient being monitored Patient Re-evaluated:Patient Re-evaluated prior to induction Oxygen Delivery Method: Circle System Utilized Preoxygenation: Pre-oxygenation with 100% oxygen Induction Type: IV induction Ventilation: Mask ventilation without difficulty Laryngoscope Size: Mac and 3 Grade View: Grade I Tube type: Oral Endobronchial tube: Left, Double lumen EBT, EBT position confirmed by auscultation and EBT position confirmed by fiberoptic bronchoscope and 35 Fr Number of attempts: 1 Airway Equipment and Method: Stylet Placement Confirmation: ETT inserted through vocal cords under direct vision, positive ETCO2 and breath sounds checked- equal and bilateral Tube secured with: Tape Dental Injury: Teeth and Oropharynx as per pre-operative assessment

## 2020-07-29 NOTE — Op Note (Signed)
      [address]       [place] [address]             [phone]        07/29/2020  Patient:  Kaylee Romero Pre-Op Dx: History of breast cancer   Recurrent left pleural effusion Post-op Dx: Same Procedure:  -Left video assisted thoracoscopy -Pleural biopsy -Insertion of Pleurx catheter  Surgeon and Role:      * Vishwa Dais, Lucile Crater, MD - Primary    *D. 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  The PET/CT was reviewed, and she appears to have nodularity involving the pleural space that is avid.  We discussed several options for obtaining a biopsy given that all of the thoracentesis have been negative for malignancy.  She would like to proceed with a surgical biopsy, and Pleurx catheter placement.  This will be performed via a left thoracoscopy.  She is tentatively scheduled for July 29, 2020.  Findings: Pleural nodularity.  Bloody effusion.  Operative Technique: After the risks, benefits and alternatives were thoroughly discussed, the patient was brought to the operative theatre.  Anesthesia was induced, and the bronchoscope was passed through the endotracheal tube.  All segmental bronchi were visualized.  The endotracheal tube was then exchanged for a double lumen tube.  The patient was then placed in a right lateral decubitus position and was prepped and draped in normal sterile fashion.  An appropriate surgical pause was performed, and pre-operative antibiotics were dosed accordingly.  We began with 3cm incision in the anterior axillary line at the 8th intercostal space.  The chest was entered, and we then placed a 1cm  incision at the 10th intercostal space, and introduced our camera port.  There was a bloody effusion that was evacuated.  The lung was directly visualized.  There was dense nodularities along the pleural surface concentrated mostly in the posterior gutter.  The chest was irrigated, and multiple biopsies of the pleural surface was taken.  We awaited the final pathology results which was consistent with carcinoma.  We then anesthetized our subcutaneous tract, and tunneled her Pleurx catheter below the costal margin.  We then passed it into the pleural space and positioned it under direct visualization.  A 19 F chest with then placed, and we watch the remaining lobes re-expand.  The skin and soft tissue were closed with absorbable suture    The patient tolerated the procedure without any immediate complications, and was transferred to the PACU in stable condition.  Eldean Klatt Bary Leriche

## 2020-07-30 ENCOUNTER — Inpatient Hospital Stay: Payer: BC Managed Care – PPO

## 2020-07-30 ENCOUNTER — Inpatient Hospital Stay (HOSPITAL_COMMUNITY): Payer: BC Managed Care – PPO

## 2020-07-30 ENCOUNTER — Encounter (HOSPITAL_COMMUNITY): Payer: Self-pay | Admitting: Thoracic Surgery (Cardiothoracic Vascular Surgery)

## 2020-07-30 ENCOUNTER — Inpatient Hospital Stay: Payer: BC Managed Care – PPO | Admitting: Oncology

## 2020-07-30 DIAGNOSIS — C50411 Malignant neoplasm of upper-outer quadrant of right female breast: Secondary | ICD-10-CM

## 2020-07-30 LAB — CBC
HCT: 34.9 % — ABNORMAL LOW (ref 36.0–46.0)
Hemoglobin: 11.7 g/dL — ABNORMAL LOW (ref 12.0–15.0)
MCH: 31.7 pg (ref 26.0–34.0)
MCHC: 33.5 g/dL (ref 30.0–36.0)
MCV: 94.6 fL (ref 80.0–100.0)
Platelets: 290 10*3/uL (ref 150–400)
RBC: 3.69 MIL/uL — ABNORMAL LOW (ref 3.87–5.11)
RDW: 12.1 % (ref 11.5–15.5)
WBC: 13.5 10*3/uL — ABNORMAL HIGH (ref 4.0–10.5)
nRBC: 0 % (ref 0.0–0.2)

## 2020-07-30 LAB — BASIC METABOLIC PANEL
Anion gap: 7 (ref 5–15)
BUN: 11 mg/dL (ref 6–20)
CO2: 29 mmol/L (ref 22–32)
Calcium: 9.2 mg/dL (ref 8.9–10.3)
Chloride: 102 mmol/L (ref 98–111)
Creatinine, Ser: 0.9 mg/dL (ref 0.44–1.00)
GFR, Estimated: >60 mL/min (ref >60)
Glucose, Bld: 104 mg/dL — ABNORMAL HIGH (ref 70–99)
Potassium: 3.9 mmol/L (ref 3.5–5.1)
Sodium: 138 mmol/L (ref 135–145)

## 2020-07-30 IMAGING — DX DG CHEST 1V PORT
1 series · 1 of 1 positions shown · non-contrast
Comparison: [DATE].

CLINICAL DATA: Chest tube.

EXAM:
PORTABLE CHEST 1 VIEW

[chest ap]
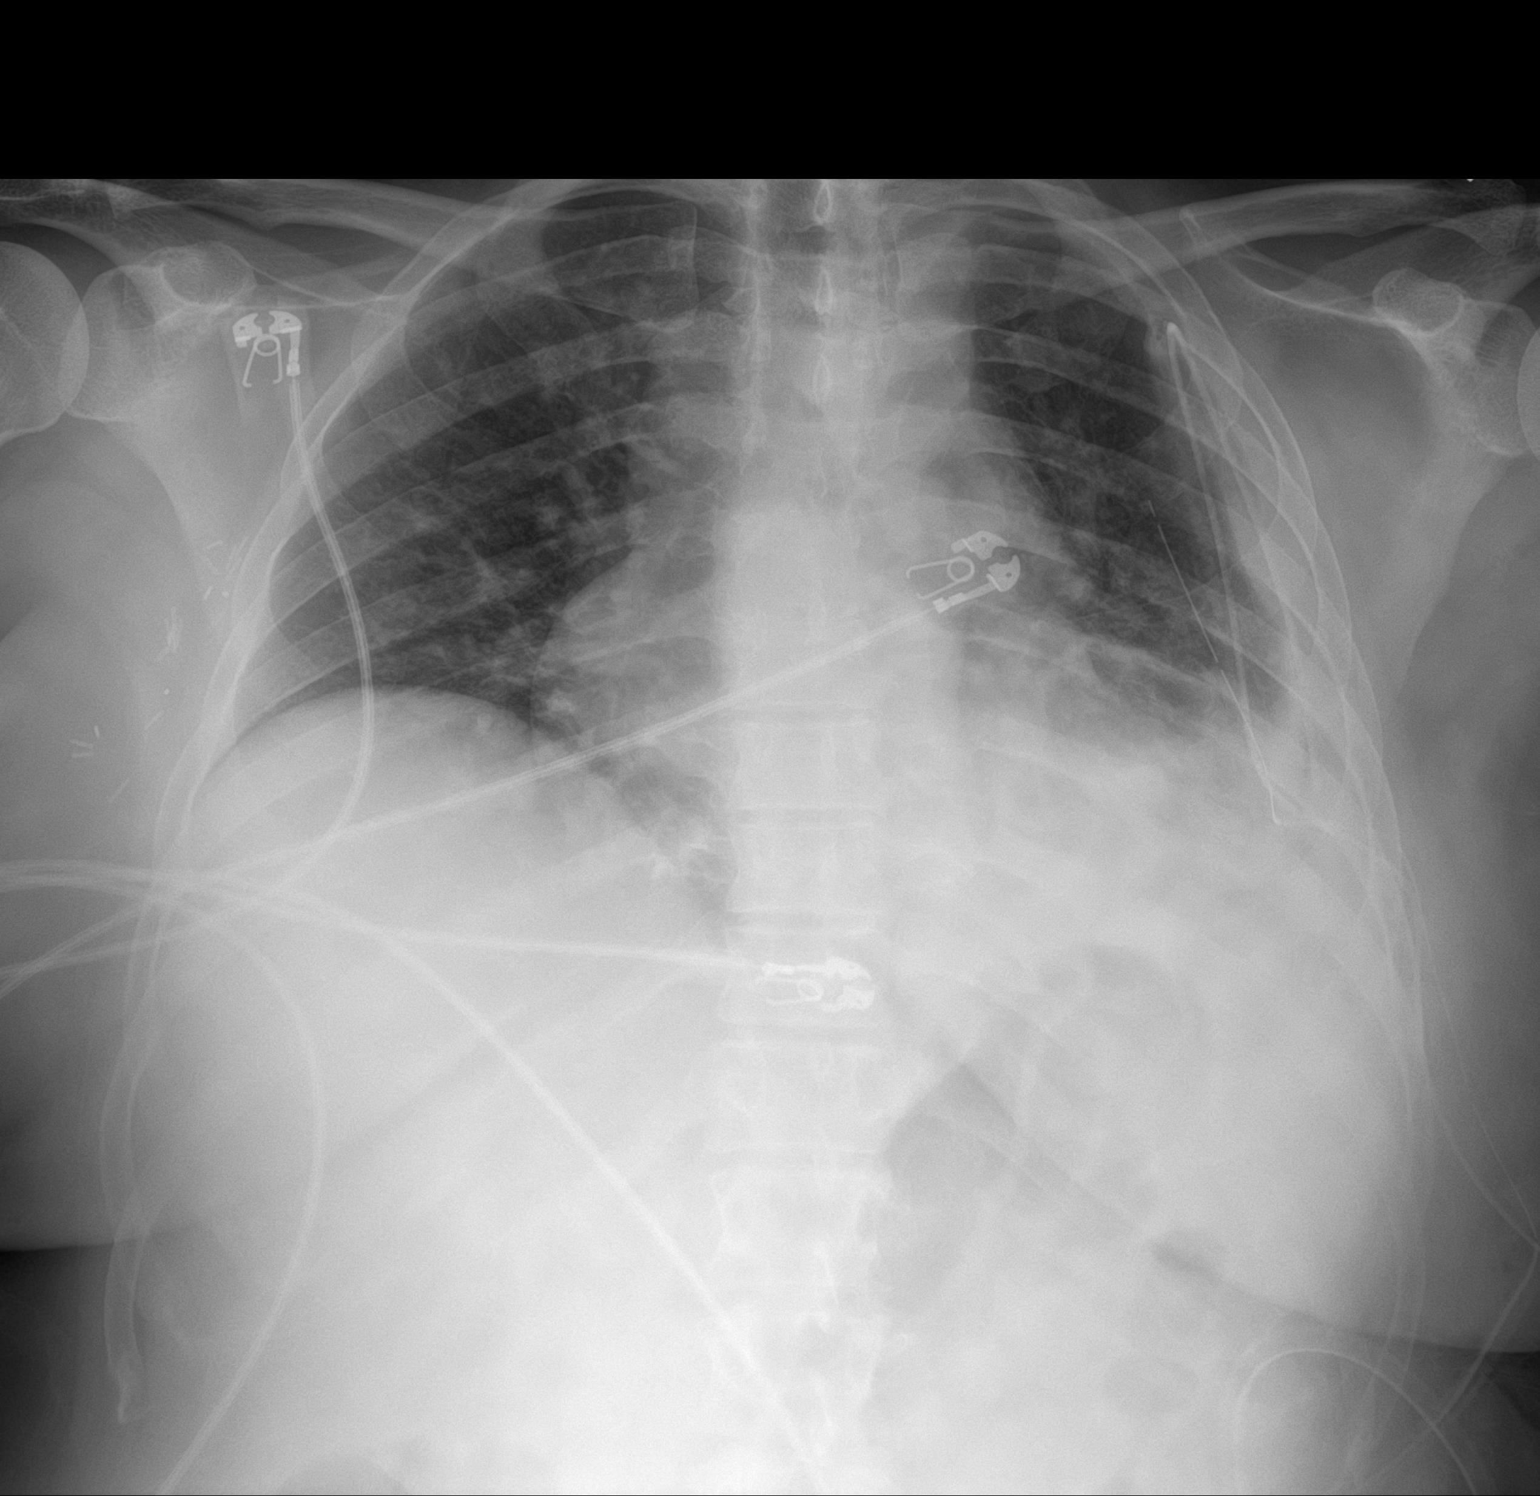

[1 of 1 positions shown; findings below may reference images not displayed]

FINDINGS: Left chest tubes in stable position. No pneumothorax. Stable mild
left pleural thickening/pleural effusion. Low lung volumes with
persistent bibasilar atelectasis. Stable cardiomegaly. No acute bony
abnormality.
IMPRESSION: 1. Left chest tube in stable position. No pneumothorax. Stable mild
left pleural thickening/pleural effusion.

2.  Low lung volumes with persistent bibasilar atelectasis.

3.  Stable cardiomegaly.

## 2020-07-30 MED ORDER — TRAMADOL HCL 50 MG PO TABS
50.0000 mg | ORAL_TABLET | Freq: Four times a day (QID) | ORAL | Status: DC | PRN
  Administered 2020-07-30: 100 mg via ORAL
  Administered 2020-07-30 – 2020-07-31 (×3): 50 mg via ORAL
  Filled 2020-07-30 (×2): qty 1
  Filled 2020-07-30 (×2): qty 2

## 2020-07-30 MED ORDER — HYDROCODONE-ACETAMINOPHEN 5-325 MG PO TABS: 1.0000 | ORAL_TABLET | Freq: Four times a day (QID) | ORAL | 0 refills | Status: DC | PRN

## 2020-07-30 NOTE — Progress Notes (Addendum)
Pati     [address]       [place] [address]             [phone]       1 Day Post-Op Procedure(s) (LRB): VIDEO ASSISTED THORACOSCOPY (Left) DRAINAGE OF PLEURAL EFFUSION (Left) INSERTION PLEURAL DRAINAGE CATHETER (Left) PLEURAL BIOPSY (Left)  Subjective: Patient with surgical/chest tube pain. She felt constipated so was given Dulcolax. Her breathing is better since surgery.  Objective: Vital signs in last 24 hours: Temp:  [97.6 F (36.4 C)-98.1 F (36.7 C)] 98.1 F (36.7 C) (07/25 1941) Pulse Rate:  [84-100] 85 (07/25 1941) Cardiac Rhythm: Normal sinus rhythm (07/25 1940) Resp:  [12-20] 20 (07/25 1941) BP: (92-128)/(66-97) 116/70 (07/25 1941) SpO2:  [92 %-100 %] 97 % (07/25 1941)     Intake/Output from previous day: 07/25 0701 - 07/26 0700 In: 1390 [P.O.:290; I.V.:800; IV Piggyback:300] Out: 461 [Urine:1; Blood:50; Chest Tube:410]   Physical Exam:  Cardiovascular: RRR Pulmonary: Clear to auscultation on the right and slightly diminished left base Abdomen: Soft, non tender, bowel sounds present. Extremities: SCDs in place Wounds: Clean and dry.  No erythema or signs of infection. Chest Tube: to suction, no air leak  Lab Results: CBC: Recent Labs    07/30/20 0619  WBC 13.5*  HGB 11.7*  HCT 34.9*  PLT 290   BMET: No results for input(s): NA, K, CL, CO2, GLUCOSE, BUN, CREATININE, CALCIUM in the last 72 hours.  PT/INR: No results for input(s): LABPROT, INR in the last 72 hours. ABG:  INR: Will add last result for INR, ABG once components are confirmed Will add last 4 CBG results once components are confirmed  Assessment/Plan:  1. CV - SR 2.  Pulmonary - On room air. Blake drain with 410 cc since surgery. CXR this am appears to show decreased left pleural effusion. As discussed with Dr. Kipp Brood, will remove Kaylee Romero drain today and begin left Pleur X drainage in am. Encourage incentive spirometer 3. Expected post op blood loss  anemia-H and H this am 11.7 and 34.9 4. As discussed with Dr. Kipp Brood, will discharge later today  [facility] 07/30/2020,6:57 AM [phone]   Agree with above. Originally plan to discharge the patient later today but she would like better pain control. Blake chest tube will be removed, will plan for discharge tomorrow.  Kaylee Romero

## 2020-07-30 NOTE — Plan of Care (Signed)

## 2020-07-30 NOTE — TOC Transition Note (Addendum)
Transition of Care [facility]) - CM/SW Discharge Note   Patient Details  Name: Kaylee Romero MRN: [id number] Date of Birth: 29-May-1980  Transition of Care [facility]) CM/SW Contact:  Zenon Mayo, RN Phone Number: 07/30/2020, 1:06 PM   Clinical Narrative:    Patient is set up with [facility] for [facility].  Per STaff RN states patient will dc tomorrow.  Canisters , a box of 10 is in patient's room.  MD needs to sign the pluerx form on the chart and fill out diagnosis.    Final next level of care: [place] Barriers to Discharge: No Barriers Identified   Patient Goals and CMS Choice Patient states their goals for this hospitalization and ongoing recovery are:: return home with [facility] CMS Medicare.gov Compare Post Acute Care list provided to:: Patient Choice offered to / list presented to : Patient  Discharge Placement                       Discharge Plan and Services                  DME Agency: NA       HH Arranged: RN [facility] Agency: [place] Date [place]: 07/30/20 Time [place]: [id number] Representative spoke with at [place]: [place] ([place]) Interventions     Readmission Risk Interventions No flowsheet data found.

## 2020-07-30 NOTE — Progress Notes (Signed)
Chest tube removed per order, patient tolerated well.

## 2020-07-30 NOTE — TOC Progression Note (Addendum)
Transition of Care [facility]) - Progression Note    Patient Details  Name: Kaylee Romero MRN: [id number] Date of Birth: 25-May-1980  Transition of Care [facility]) CM/SW Contact  Zenon Mayo, RN Phone Number: 07/30/2020, 11:51 AM  Clinical Narrative:    NCM spoke with patient, offered choice, she does not have a preference.  NCM made referral to [facility] with [place] for [facility] for pluerx drains.  Percell Locus the Network engineer has ordered a box of 10 .  Awaiting to hear back from [place].  Stacie with [facility] is unable to take referral for [facility].  NCM made referral to [facility] with [facility].  He is able to take referral for [facility].        Expected Discharge Plan and Services           Expected Discharge Date: 07/30/20                                     Social Determinants of Health (SDOH) Interventions    Readmission Risk Interventions No flowsheet data found.

## 2020-07-31 LAB — COMPREHENSIVE METABOLIC PANEL
ALT: 15 U/L (ref 0–44)
AST: 24 U/L (ref 15–41)
Albumin: 2.6 g/dL — ABNORMAL LOW (ref 3.5–5.0)
Alkaline Phosphatase: 41 U/L (ref 38–126)
Anion gap: 6 (ref 5–15)
BUN: 7 mg/dL (ref 6–20)
CO2: 28 mmol/L (ref 22–32)
Calcium: 8.7 mg/dL — ABNORMAL LOW (ref 8.9–10.3)
Chloride: 102 mmol/L (ref 98–111)
Creatinine, Ser: 0.54 mg/dL (ref 0.44–1.00)
GFR, Estimated: >60 mL/min (ref >60)
Glucose, Bld: 102 mg/dL — ABNORMAL HIGH (ref 70–99)
Potassium: 4.3 mmol/L (ref 3.5–5.1)
Sodium: 136 mmol/L (ref 135–145)
Total Bilirubin: 0.3 mg/dL (ref 0.3–1.2)
Total Protein: 5.6 g/dL — ABNORMAL LOW (ref 6.5–8.1)

## 2020-07-31 LAB — CYTOLOGY - NON PAP

## 2020-07-31 MED ORDER — HYDROCODONE-ACETAMINOPHEN 5-325 MG PO TABS
1.0000 | ORAL_TABLET | Freq: Four times a day (QID) | ORAL | Status: DC | PRN
  Administered 2020-07-31: 1 via ORAL
  Filled 2020-07-31: qty 1

## 2020-07-31 NOTE — Progress Notes (Signed)
Patient and spouse present for pleurex teaching. [place] Pleurex instructions printed and given to patient for future reference. Patient and spouse to work together to drain pleurex and apply new dressing per step by step instructions with helpful hints and suggestions.  Both together completed the pleural drain, 5 ccs output serosanguinous. Dressing applied without difficulty. Questions answered. Both patient and spouse verbalize comfort in performing pleural drain, and applying the dressing.

## 2020-07-31 NOTE — Progress Notes (Addendum)
Pati     [address]       [place] [address]             [phone]       2 Days Post-Op Procedure(s) (LRB): VIDEO ASSISTED THORACOSCOPY (Left) DRAINAGE OF PLEURAL EFFUSION (Left) INSERTION PLEURAL DRAINAGE CATHETER (Left) PLEURAL BIOPSY (Left)  Subjective: Patient states back of hand really hurts after Toradol is given so she asked that it be stopped  Objective: Vital signs in last 24 hours: Temp:  [98.4 F (36.9 C)-99.4 F (37.4 C)] 99.4 F (37.4 C) (07/27 0621) Pulse Rate:  [103-106] 104 (07/27 0621) Cardiac Rhythm: Normal sinus rhythm (07/26 1905) Resp:  [18-20] 20 (07/27 0621) BP: (96-109)/(57-66) 109/66 (07/27 0621) SpO2:  [94 %-99 %] 95 % (07/27 0621)     Intake/Output from previous day: 07/26 0701 - 07/27 0700 In: 240 [P.O.:240] Out: 50 [Chest Tube:50]   Physical Exam:  Cardiovascular: Slightly tachycardic Pulmonary: Clear to auscultation on the right and slightly diminished left base Abdomen: Soft, non tender, bowel sounds present. Extremities: SCDs in place Wounds: Clean and dry.  No erythema or signs of infection.   Lab Results: CBC: Recent Labs    07/30/20 0619  WBC 13.5*  HGB 11.7*  HCT 34.9*  PLT 290    BMET:  Recent Labs    07/30/20 0619 07/31/20 0546  NA 138 136  K 3.9 4.3  CL 102 102  CO2 29 28  GLUCOSE 104* 102*  BUN 11 7  CREATININE 0.90 0.54  CALCIUM 9.2 8.7*     PT/INR: No results for input(s): LABPROT, INR in the last 72 hours. ABG:  INR: Will add last result for INR, ABG once components are confirmed Will add last 4 CBG results once components are confirmed  Assessment/Plan:  1. CV - SR;ST at times 2.  Pulmonary - On room air. Will begin left Pleur X drainage today. Encourage incentive spirometer 3. Expected post op blood loss anemia-H and H this am 11.7 and 34.9 4. Regarding pain control, stop Toradol and Ultram. Will give Norco as this is what patient requested at discharge 5. Will  discharge later today  Shiquan Mathieu M ZimmermanPA-C 07/31/2020,7:07 AM [phone]

## 2020-08-01 DIAGNOSIS — Z48813 Encounter for surgical aftercare following surgery on the respiratory system: Secondary | ICD-10-CM | POA: Diagnosis not present

## 2020-08-01 LAB — FUNGUS CULTURE WITH STAIN

## 2020-08-01 LAB — FUNGAL ORGANISM REFLEX

## 2020-08-01 LAB — FUNGUS CULTURE RESULT

## 2020-08-02 ENCOUNTER — Ambulatory Visit (INDEPENDENT_AMBULATORY_CARE_PROVIDER_SITE_OTHER): Payer: Self-pay | Admitting: Thoracic Surgery (Cardiothoracic Vascular Surgery)

## 2020-08-02 ENCOUNTER — Encounter: Payer: Self-pay | Admitting: Thoracic Surgery (Cardiothoracic Vascular Surgery)

## 2020-08-02 ENCOUNTER — Other Ambulatory Visit: Payer: Self-pay

## 2020-08-02 ENCOUNTER — Ambulatory Visit: Payer: BC Managed Care – PPO | Admitting: Radiation Oncology

## 2020-08-02 VITALS — BP 114/80 | HR 109 | Ht 64.0 in | Wt 174.0 lb

## 2020-08-02 DIAGNOSIS — Z4889 Encounter for other specified surgical aftercare: Secondary | ICD-10-CM

## 2020-08-02 DIAGNOSIS — J90 Pleural effusion, not elsewhere classified: Secondary | ICD-10-CM

## 2020-08-02 NOTE — Progress Notes (Signed)
      [address]       [place] [address]             [phone]        Jeanette B Culton [place] Medical Record [id number] Date of Birth: 11/13/1980  Referring: Tyler Pita, MD Primary Care: Dion Body, MD Primary Cardiologist:None  Reason for visit:   follow-up  History of Present Illness:     40 year old female presents for 1 week follow-up appointment.  She underwent a left VATS with pleural biopsy and Pleurx catheter placement.  She has no complaints from a respiratory standpoint.  She is only draining less than 50 cc over the last 3 days.  She is currently draining once a day.  Physical Exam: BP 114/80 (BP Location: Left Arm, Patient Position: Sitting)   Pulse (!) 109   Ht '5\' 4"'$  (1.626 m)   Wt 174 lb (78.9 kg)   SpO2 99% Comment: RA  BMI 29.87 kg/m   Alert NAD Incision clean, blistering and erythema from the tape..   Serosanguineous pleural fluid drained.       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  A Pleurx catheter was placed after the pleural biopsy.  She would like to continue to follow-up with [place] for management of her Pleurx catheter.  I instructed her to transition to once every other day and I will assess her in 1 month to determine should remove the drain.  In regards to the Pleurx catheter management she will draining every other day until the output is less than 200 mL for 1 week with each drainage.  If she reaches that milestone then she can transition to twice a week.  Once that is under 200 mils per drainage for 1week then she will transition to once a week.   Lajuana Matte 08/02/2020 4:20 PM

## 2020-08-05 LAB — SURGICAL PATHOLOGY

## 2020-08-06 ENCOUNTER — Telehealth: Payer: Self-pay | Admitting: Oncology

## 2020-08-06 NOTE — Telephone Encounter (Signed)
Left Vm for patient to contact the office to reschedule her New pt appt. Asked to return call to office for scheduling.

## 2020-08-08 ENCOUNTER — Inpatient Hospital Stay: Payer: BC Managed Care – PPO

## 2020-08-08 ENCOUNTER — Other Ambulatory Visit: Payer: Self-pay

## 2020-08-08 ENCOUNTER — Encounter: Payer: Self-pay | Admitting: *Deleted

## 2020-08-08 ENCOUNTER — Encounter: Payer: Self-pay | Admitting: Oncology

## 2020-08-08 ENCOUNTER — Inpatient Hospital Stay: Payer: BC Managed Care – PPO | Attending: Oncology | Admitting: Oncology

## 2020-08-08 VITALS — BP 133/83 | HR 118 | Temp 98.8°F | Resp 18 | Wt 171.9 lb

## 2020-08-08 DIAGNOSIS — R059 Cough, unspecified: Secondary | ICD-10-CM | POA: Insufficient documentation

## 2020-08-08 DIAGNOSIS — Z170 Estrogen receptor positive status [ER+]: Secondary | ICD-10-CM | POA: Diagnosis not present

## 2020-08-08 DIAGNOSIS — C50411 Malignant neoplasm of upper-outer quadrant of right female breast: Secondary | ICD-10-CM | POA: Insufficient documentation

## 2020-08-08 DIAGNOSIS — D75839 Thrombocytosis, unspecified: Secondary | ICD-10-CM | POA: Insufficient documentation

## 2020-08-08 DIAGNOSIS — C7800 Secondary malignant neoplasm of unspecified lung: Secondary | ICD-10-CM | POA: Insufficient documentation

## 2020-08-08 NOTE — Progress Notes (Signed)
Patient is having pain around catheter site.

## 2020-08-08 NOTE — Progress Notes (Signed)
Ms. Yaeger was evaluated in our office today. I was personally asked to see the patient to evaluate her concerns regarding her pleurx cath. Pt is only draining every other day with minimal output. She stated that she is very eager to have her pleurx cath out as there has not been sufficient output.  Dressing was noted to wet upon dressing change in the clinic today. Pt stated that she "had not changed her dressing after bathing and she didn't know she had to change the dressing." Pt education reinforced to change dressing when solid/wet or at each time she drained. I educated patient to coordinate her dressing change around her baths/showers. Pt in need for additional pleurx cath dressing kits from [facility].  I asked her to let her surgeon know this. I explained that the dressing kits are available, but I would defer this to her surgeon (since he was the provider to order the supplies initially).  Patient asked to drain in the clinic today. Pleurx cath drain performed per hospital policy. Pt tolerated draining well. However, only a scant amount of bright red fluid was obtained.  Mild erythema noted around the drain itself. Some white drainage present at the drain site before cleaning the site.  She has a virtual visit with scheduled with her cardiothoracic surgeon tomorrow. Msg sent to her Cardiothoracic surgeon regarding patient's pleurx cath mgmt. concerns. Pt understands that she can call the cancer center back at any time and I will be glad to advocate for her regarding the pleurx cath concerns. She will have her home health nurse reschedule for this weekend if possible, since she drained today. Pt understands to watch for signs of infection (fevers and red streaks at the pleurx cath site.). teach back process performed with patient.

## 2020-08-08 NOTE — Progress Notes (Signed)
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  INTERVAL HISTORY: Patient is a 40 year old female who underwent lumpectomy, chemotherapy, and radiation therapy in 2020 for stage II breast cancer.  She had been on tamoxifen for approximately 2 years went over the past several months she became increasingly short of breath.  Subsequent imaging, thoracentesis, biopsy revealed recurrent disease.  She is anxious, but otherwise feels well.  She has no neurologic complaints.  She denies any recent fevers or illnesses.  She has a good appetite and denies weight loss.  She has no chest pain, hemoptysis, or cough.  She denies any nausea, vomiting, constipation, or diarrhea.  She has no urinary complaints.  Patient otherwise feels well and offers no further specific complaints today.  REVIEW OF SYSTEMS:   Review of Systems  Constitutional: Negative.  Negative for fever, malaise/fatigue and weight loss.  Respiratory:  Positive for shortness of breath. Negative for cough and hemoptysis.   Cardiovascular: Negative.  Negative for chest pain and leg swelling.  Gastrointestinal: Negative.  Negative for abdominal pain.  Genitourinary: Negative.  Negative for dysuria.  Musculoskeletal: Negative.  Negative for back pain.  Skin: Negative.  Negative for rash.  Neurological: Negative.  Negative for  dizziness, focal weakness, weakness and headaches.  Psychiatric/Behavioral:  The patient is nervous/anxious.    As per HPI. Otherwise, a complete review of systems is negative.  PAST MEDICAL HISTORY: Past Medical History:  Diagnosis Date   Anxiety    Family history of breast cancer    HSV (herpes simplex virus) anogenital infection    positive titer only    PAST SURGICAL HISTORY: Past Surgical History:  Procedure Laterality Date   BREAST LUMPECTOMY WITH AXILLARY LYMPH NODE BIOPSY Right 11/21/2018   Procedure: RIGHT BREAST LUMPECTOMY WITH SENTINEL LYMPH NODE BIOPSY;  Surgeon: Jovita Kussmaul, MD;  Location: [facility];  Service: General;  Laterality: Right;   BREAST REDUCTION SURGERY Bilateral 11/21/2018   Procedure: BILATERAL MAMMARY REDUCTION  (BREAST);  Surgeon: Wallace Going, DO;  Location: [facility];  Service: Plastics;  Laterality: Bilateral;   CHEST TUBE INSERTION Left 07/29/2020   Procedure: INSERTION PLEURAL DRAINAGE CATHETER;  Surgeon: Lajuana Matte, MD;  Location: [facility];  Service: Thoracic;  Laterality: Left;   PLEURAL BIOPSY Left 07/29/2020   Procedure: PLEURAL BIOPSY;  Surgeon: Lajuana Matte, MD;  Location: [facility];  Service: Thoracic;  Laterality: Left;   PLEURAL EFFUSION DRAINAGE Left 07/29/2020   Procedure: DRAINAGE OF PLEURAL EFFUSION;  Surgeon: Lajuana Matte, MD;  Location: [facility];  Service: Thoracic;  Laterality: Left;   PORT-A-CATH REMOVAL N/A 07/06/2019   Procedure: REMOVAL PORT-A-CATH;  Surgeon: Jovita Kussmaul, MD;  Location: [facility];  Service: General;  Laterality: N/A;   PORTACATH PLACEMENT N/A 11/21/2018   Procedure: INSERTION LEFT PORT-A-CATH WITH ULTRASOUND GUIDANCE;  Surgeon: Jovita Kussmaul, MD;  Location: [facility];  Service: General;  Laterality: N/A;   [place]  VIDEO ASSISTED THORACOSCOPY Left 07/29/2020   Procedure: VIDEO ASSISTED THORACOSCOPY;  Surgeon: Lajuana Matte, MD;  Location[facility];   Service: Thoracic;  Laterality: Left;   WISDOM TOOTH EXTRACTION      FAMILY HISTORY: Family History  Problem Relation Age of Onset   Hypertension Father    Alcohol abuse Paternal Grandfather    Heart disease Paternal Grandfather    Alcohol abuse Maternal Grandfather    Heart disease Maternal Grandmother    Breast cancer Other     ADVANCED DIRECTIVES (Y/N):  N  HEALTH MAINTENANCE: Social History   Tobacco Use   Smoking status: Former    Packs/day: 0.50    Years: 20.00    Pack years: 10.00    Types: Cigarettes    Start date: 01/13/2002    Quit date: 10/17/2016    Years since quitting: 3.8   Smokeless tobacco: Never  Vaping Use   Vaping Use: Never used  Substance Use Topics   Alcohol use: Yes    Comment: Socially   Drug use: No     Colonoscopy:  PAP:  Bone density:  Lipid panel:  Allergies  Allergen Reactions   Betadine [Povidone-Iodine]     Current Outpatient Medications  Medication Sig Dispense Refill   amphetamine-dextroamphetamine (ADDERALL XR) 30 MG 24 hr capsule Take 30 mg by mouth daily.     amphetamine-dextroamphetamine (ADDERALL) 10 MG tablet Take 10 mg by mouth daily in the afternoon.     Ascorbic Acid (VITAMIN C) 500 MG CAPS Take 500 mg by mouth daily.     hydrOXYzine (ATARAX/VISTARIL) 10 MG tablet Take 10 mg by mouth at bedtime as needed for anxiety or itching (Sleep).     ibuprofen (ADVIL) 200 MG tablet Take 400 mg by mouth every 6 (six) hours as needed for mild pain.     Multiple Vitamin (MULTIVITAMIN WITH MINERALS) TABS tablet Take 1 tablet by mouth daily.     naproxen sodium (ALEVE) 220 MG tablet Take 1 tablet (220 mg total) by mouth 3 (three) times daily with meals. 60 tablet 3   tamoxifen (NOLVADEX) 20 MG tablet Take 20 mg by mouth at bedtime.     Tetrahydrozoline HCl (VISINE OP) Place 1 drop into both eyes daily as needed (redness).     venlafaxine XR (EFFEXOR-XR) 75 MG 24 hr capsule Take 1 capsule (75 mg total) by mouth at bedtime.      vitamin B-12 (CYANOCOBALAMIN) 1000 MCG tablet Take 1,000 mcg by mouth daily.     ALPRAZolam (XANAX) 0.5 MG tablet Take 0.5 mg by mouth at bedtime as needed for anxiety. (Patient not taking: Reported on 08/08/2020)     HYDROcodone-acetaminophen (NORCO/VICODIN) 5-325 MG tablet Take 1 tablet by mouth every 6 (six) hours as needed for moderate pain. (Patient not taking: Reported on 08/08/2020) 30 tablet 0   tacrolimus (PROTOPIC) 0.1 % ointment Apply 1 application topically daily. 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    OBJECTIVE: Vitals:   08/08/20 1514  BP: 133/83  Pulse: (!) 118  Resp: 18  Temp: 98.8 F (37.1 C)     Body mass index is 29.51 kg/m.    ECOG FS:0 - Asymptomatic  General: Well-developed, well-nourished, no acute distress. Eyes: Pink conjunctiva, anicteric sclera. HEENT: Normocephalic, moist mucous membranes. Lungs: No audible wheezing or coughing. Heart: Regular rate and rhythm. Abdomen: Soft, nontender, no obvious distention. Musculoskeletal: No  edema, cyanosis, or clubbing. Neuro: Alert, answering all questions appropriately. Cranial nerves grossly intact. Skin: No rashes or petechiae noted. Psych: Normal affect. Lymphatics: No cervical, calvicular, axillary or inguinal LAD.   LAB RESULTS:  Lab Results  Component Value Date   NA 136 07/31/2020   K 4.3 07/31/2020   CL 102 07/31/2020   CO2 28 07/31/2020   GLUCOSE 102 (H) 07/31/2020   BUN 7 07/31/2020   CREATININE 0.54 07/31/2020   CALCIUM 8.7 (L) 07/31/2020   PROT 5.6 (L) 07/31/2020   ALBUMIN 2.6 (L) 07/31/2020   AST 24 07/31/2020   ALT 15 07/31/2020   ALKPHOS 41 07/31/2020   BILITOT 0.3 07/31/2020   GFRNONAA >60 07/31/2020   GFRAA >60 08/07/2019    Lab Results  Component Value Date   WBC 13.5 (H) 07/30/2020   NEUTROABS 4.7 04/02/2020   HGB 11.7 (L) 07/30/2020   HCT 34.9 (L) 07/30/2020    MCV 94.6 07/30/2020   PLT 290 07/30/2020     STUDIES: DG Chest 2 View  Result Date: 07/29/2020 CLINICAL DATA:  Preop for pleural effusion EXAM: CHEST - 2 VIEW COMPARISON:  07/22/2020 FINDINGS: Similar degree of moderate left pleural effusion recently evaluated by CT and PET. Normal heart size and mediastinal contours. Postoperative right axilla. IMPRESSION: Unchanged moderate left pleural effusion. Electronically Signed   By: Monte Fantasia M.D.   On: 07/29/2020 06:30   DG Chest 2 View  Result Date: 07/22/2020 CLINICAL DATA:  Pleural effusion EXAM: CHEST - 2 VIEW COMPARISON:  07/19/2020, PET CT 07/18/2020 FINDINGS: Moderate left pleural effusion with probable loculation. Airspace disease at the left base as before. Normal cardiomediastinal silhouette. No pneumothorax. IMPRESSION: No significant change in moderate left pleural effusion with probable loculation. Electronically Signed   By: Donavan Foil M.D.   On: 07/22/2020 15:40   NM PET Image Initial (PI) Skull Base To Thigh  Result Date: 07/19/2020 CLINICAL DATA:  Initial treatment strategy for loculated effusion and nodularity of the pleural surface in a patient with history of breast cancer. EXAM: NUCLEAR MEDICINE PET SKULL BASE TO THIGH TECHNIQUE: 8.8 mCi F-18 FDG was injected intravenously. Full-ring PET imaging was performed from the skull base to thigh after the radiotracer. CT data was obtained and used for attenuation correction and anatomic localization. Fasting blood glucose: 84 mg/dl COMPARISON:  Chest CT July 03, 2020. FINDINGS: Mediastinal blood pool activity: SUV max 1.97 Liver activity: SUV max NA NECK: No hypermetabolic lymph nodes in the neck. Incidental CT findings: none CHEST: Circumferential pleural nodularity associated with the loculated pleural effusion that was seen on previous chest imaging. This involves the entire circumference of the pleura and shows a maximum SUV in the range of 9.5 posteriorly along the inferior  chest and 13.1 anteriorly along the inferior chest measured on image 123 of series 3. Circumferential pleural thickening is more pronounced in the costodiaphragmatic sulci better demonstrated on the contrasted CT. Nodularity extends along the LEFT mediastinal border (image 86/3). Measures up to 7 mm greatest thickness and shows a maximum SUV in the range of 8.2 LEFT retropectoral lymph node (image 215/6, 76/3 with a maximum SUV of 2.6, this measures less than a cm and there are small retropectoral lymph nodes on the LEFT and a small LEFT supraclavicular lymph node as well on image 66/3 that measures 7 mm showing a maximum SUV of 2.2 Signs of RIGHT axillary dissection with a maximum SUV of 1.8 and some soft tissue surrounding surgical clips in this area measuring  1.8 x 1.1 cm (image 85/3) RIGHT retrocrural lymph node (image 130/3) maximum SUV of 5.3 5 mm. No discrete mediastinal adenopathy. Some uptake about the LEFT hilum is contiguous with pleural thickening. 6 mm pulmonary nodule in the RIGHT upper lobe (image 89/3) this is not associated with increased FDG uptake. Well-circumscribed RIGHT lower lobe pulmonary nodule (image 110/3) 7 mm maximum SUV of 1.1 Fissural nodularity in the LEFT mid chest (image 93/3) 11 mm with an X min SUV of 5.6 Incidental CT findings: Normal caliber of the thoracic aorta. Normal heart size. Pericardial thickening and thickening along the LEFT mediastinal border. Is normal caliber of the central pulmonary vessels. Limited assessment of cardiovascular structures given lack of intravenous contrast. Loculated LEFT effusion of similar volume to previous imaging. ABDOMEN/PELVIS: Heterogeneous uptake in the liver without discrete CT correlate. More focal area of uptake in hepatic subsegment VIII is equivocal with a maximum SUV of 3.6 (image 706/2) this is of uncertain significance given the heterogeneity seen with respect to hepatic uptake in general. No signs of visceral or nodal disease in  the abdomen Incidental CT findings: No acute findings relative liver, gallbladder, spleen, pancreas, adrenal glands or kidneys. The urinary bladder is collapsed. No signs of bowel obstruction. Appendix is normal. IUD in place. No intra-abdominal ascites. SKELETON: No focal hypermetabolic activity to suggest skeletal metastasis. Incidental CT findings: none IMPRESSION: Signs of circumferential pleural disease most likely related to metastatic disease from breast cancer given patient history. This pattern can also be seen in the setting of mesothelioma and is associated with a loculated LEFT pleural effusion. Retrocrural lymph node with definite involvement. Nodules in the RIGHT chest without increased metabolic activity dominant nodule in the RIGHT lower lobe. Suspicious given other findings, attention on follow-up. Equivocal retropectoral and supraclavicular lymph nodes, attention on follow-up. Heterogeneous uptake pattern in the liver. Slightly more pronounced area in the posterior RIGHT hemi liver is equivocal. Could consider hepatic MRI for further assessment with and without contrast. Eovist contrast is often helpful in this setting. Electronically Signed   By: Zetta Bills M.D.   On: 07/19/2020 15:37   DG CHEST PORT 1 VIEW  Result Date: 07/30/2020 CLINICAL DATA:  Chest tube. EXAM: PORTABLE CHEST 1 VIEW COMPARISON:  07/29/2020. FINDINGS: Left chest tubes in stable position. No pneumothorax. Stable mild left pleural thickening/pleural effusion. Low lung volumes with persistent bibasilar atelectasis. Stable cardiomegaly. No acute bony abnormality. IMPRESSION: 1. Left chest tube in stable position. No pneumothorax. Stable mild left pleural thickening/pleural effusion. 2.  Low lung volumes with persistent bibasilar atelectasis. 3.  Stable cardiomegaly. Electronically Signed   By: Marcello Moores  Register   On: 07/30/2020 07:38   DG Chest Port 1 View  Result Date: 07/29/2020 CLINICAL DATA:  Status post pleural  drainage catheter placement. EXAM: PORTABLE CHEST 1 VIEW COMPARISON:  Same day. FINDINGS: Stable cardiomegaly. Interval placement of left-sided chest tube. Left pleural effusion is significantly smaller. Right lung is clear. IMPRESSION: Left pleural effusion is significantly smaller status post left-sided chest tube placement. Electronically Signed   By: Marijo Conception M.D.   On: 07/29/2020 11:55   DG Chest Port 1 View  Result Date: 07/19/2020 CLINICAL DATA:  Post left-sided thoracentesis. EXAM: PORTABLE CHEST 1 VIEW COMPARISON:  07/01/2020; chest CT-07/03/2020; PET-CT-07/18/2020 FINDINGS: Grossly unchanged cardiac silhouette and mediastinal contours. Interval reduction in persistent moderate-sized partially loculated left-sided effusion post thoracentesis. No pneumothorax. Residual left basilar heterogeneous/consolidative opacities, unchanged. The right hemithorax remains well aerated. No new focal airspace opacities. No  evidence of edema. No acute osseous abnormalities. Surgical clips overlie the upper outer quadrant of the right breast. IMPRESSION: 1. Slight reduction in persistent moderate sized partially loculated left-sided effusion post thoracentesis. No pneumothorax. 2. Unchanged left basilar heterogeneous/consolidative opacities, likely atelectasis. Electronically Signed   By: Sandi Mariscal M.D.   On: 07/19/2020 15:20   US THORACENTESIS ASP PLEURAL SPACE W/IMG GUIDE  Result Date: 07/19/2020 INDICATION: Recurrent symptomatic left-sided pleural effusion. Please perform ultrasound-guided thoracentesis for diagnostic and therapeutic purposes. EXAM: US THORACENTESIS ASP PLEURAL SPACE W/IMG GUIDE COMPARISON:  PET-CT-07/18/2020; chest CT-07/03/2020; ultrasound-guided left-sided thoracentesis-07/03/2020 (yielding 700 cc of pleural fluid). MEDICATIONS: None. COMPLICATIONS: None immediate. TECHNIQUE: Informed written consent was obtained from the patient after a discussion of the risks, benefits and  alternatives to treatment. A timeout was performed prior to the initiation of the procedure. Initial ultrasound scanning demonstrates a moderate sized recurrent left-sided pleural effusion the basilar component of which is noted to be extensively loculated. An adequate percutaneous window was identified at the level of the left mid lateral chest and the procedure was planned. The chest was prepped and draped in the usual sterile fashion. 1% lidocaine was used for local anesthesia. An ultrasound image was saved for documentation purposes. An 8 Fr Safe-T-Centesis catheter was introduced. The thoracentesis was performed. The catheter was removed and a dressing was applied. The patient tolerated the procedure well without immediate post procedural complication. The patient was escorted to have an upright chest radiograph. FINDINGS: A total of approximately 275 cc of serous fluid was removed. Requested samples were sent to the laboratory. IMPRESSION: Successful ultrasound-guided left sided thoracentesis yielding 275 cc of pleural fluid. Note, an incomplete thoracentesis was performed secondary to extensive loculations within the pleural fluid. Electronically Signed   By: Sandi Mariscal M.D.   On: 07/19/2020 15:35    ONCOLOGY HISTORY: Patient initially self palpated a lump in the 12 o'clock position of her right breast and subsequently underwent biopsy on September 26, 2018 confirming malignancy.  Initial MammaPrint was reported high risk.  She underwent right lumpectomy on November 21, 2018 which revealed a T2, N1, M0 stage IIa malignancy.  1 of 4 lymph nodes were positive for disease.  She subsequently underwent adjuvant chemotherapy with AC/Taxol completing treatment on Jun 01, 2019.  She then completed adjuvant XRT on August 11, 2019 and was started on tamoxifen.  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  She has noted to have retrocrural lymph node with definite involvement and suspicious retroperitoneal and supraclavicular nodes.  She had equivocal hypermetabolism in her liver.  Patient also underwent second opinion at [facility] in [place].  Agree with recommendation to proceed with Lupron 3.75 mg every 28 days for up to 24 months for ovarian suppression along with an aromatase inhibitor and Ibrance 125 mg for 21 days with 7 days off.  She has been instructed to discontinue tamoxifen.  Can also consider capecitabine or [place] in the future if treatment is not tolerated or patient has progression of disease.  Will get an MRI brain to complete the staging work-up.  Return to clinic in 1 week to initiate treatment. Shortness of breath: Patient  continues to have Pleurx catheter in place, but is draining minimal fluid.  She has a virtual visit with thoracic surgery tomorrow.  I spent a total of 60 minutes reviewing chart data, face-to-face evaluation with the patient, counseling and coordination of care as detailed above.   Patient expressed understanding and was in agreement with this plan. She also understands that She can call clinic at any time with any questions, concerns, or complaints.   Cancer Staging Malignant neoplasm of upper-outer quadrant of right breast in female, estrogen receptor positive ([place]) Staging form: Breast, AJCC 8th Edition - Clinical stage from 10/05/2018: Stage IB (cT2, cN0, cM0, G2, ER+, PR+, HER2-) - Signed by Chauncey Cruel, MD on 04/02/2020 Stage prefix: Initial diagnosis Histologic grading system: 3 grade system - Pathologic stage from 08/09/2020: No Stage Recommended (ypT2, pN1a, pM1, G2, ER+, PR+, HER2-)  - Signed by Lloyd Huger, MD on 08/09/2020 Stage prefix: Post-therapy Multigene prognostic tests performed: MammaPrint Histologic grading system: 3 grade system   Lloyd Huger, MD   08/09/2020 8:47 AM

## 2020-08-08 NOTE — Progress Notes (Signed)
DISCONTINUE ON PATHWAY REGIMEN - Breast   Dose-Dense AC q14 days:   A cycle is every 14 days:     Doxorubicin      Cyclophosphamide      [id number]    Paclitaxel 80 mg/m2 Weekly:   Administer weekly:     Paclitaxel   **Always confirm dose/schedule in your pharmacy ordering system**  REASON: Other Reason PRIOR TREATMENT: [id number]: Dose-Dense AC-T (Paclitaxel Weekly) - [Doxorubicin + Cyclophosphamide q14 Days x 4 Cycles, Followed by Paclitaxel 80 mg/m2 Weekly x 12 Weeks] TREATMENT RESPONSE: Progressive Disease (PD)  START ON PATHWAY REGIMEN - Breast     A cycle is every 21 days:     Capecitabine   **Always confirm dose/schedule in your pharmacy ordering system**  Patient Characteristics: Distant Metastases or Locoregional Recurrent Disease - Unresected or Locally Advanced Unresectable Disease Progressing after Neoadjuvant and Local Therapies, HER2 Negative/Unknown/Equivocal, ER Positive, Chemotherapy, Second Line Therapeutic Status: Distant Metastases ER Status: Positive (+) HER2 Status: Negative (-) PR Status: Positive (+) Therapy Approach Indicated: Standard Chemotherapy/Endocrine Therapy Line of Therapy: Second Line Intent of Therapy: Non-Curative / Palliative Intent, Discussed with Patient

## 2020-08-09 ENCOUNTER — Emergency Department: Payer: BC Managed Care – PPO

## 2020-08-09 ENCOUNTER — Inpatient Hospital Stay
Admission: EM | Admit: 2020-08-09 | Discharge: 2020-08-09 | DRG: 872 | Disposition: A | Discharge status: Other Acute Inpatient Hospital | Payer: BC Managed Care – PPO | Attending: Family Medicine | Admitting: Family Medicine

## 2020-08-09 ENCOUNTER — Other Ambulatory Visit (HOSPITAL_COMMUNITY): Payer: Self-pay

## 2020-08-09 ENCOUNTER — Other Ambulatory Visit: Payer: Self-pay | Admitting: Oncology

## 2020-08-09 ENCOUNTER — Telehealth: Payer: Self-pay | Admitting: Thoracic Surgery (Cardiothoracic Vascular Surgery)

## 2020-08-09 ENCOUNTER — Telehealth: Payer: Self-pay

## 2020-08-09 ENCOUNTER — Inpatient Hospital Stay (HOSPITAL_COMMUNITY)
Admission: AD | Admit: 2020-08-09 | Discharge: 2020-08-15 | DRG: 919 | Disposition: A | Discharge status: Home Health | Payer: BC Managed Care – PPO | Source: Other Acute Inpatient Hospital | Attending: Internal Medicine | Admitting: Internal Medicine

## 2020-08-09 ENCOUNTER — Telehealth (INDEPENDENT_AMBULATORY_CARE_PROVIDER_SITE_OTHER): Payer: Self-pay | Admitting: Thoracic Surgery (Cardiothoracic Vascular Surgery)

## 2020-08-09 ENCOUNTER — Telehealth: Payer: Self-pay | Admitting: Pharmacist

## 2020-08-09 ENCOUNTER — Encounter: Payer: Self-pay | Admitting: Oncology

## 2020-08-09 ENCOUNTER — Telehealth: Payer: Self-pay | Admitting: Pulmonary Disease

## 2020-08-09 ENCOUNTER — Other Ambulatory Visit: Payer: Self-pay | Admitting: Thoracic Surgery (Cardiothoracic Vascular Surgery)

## 2020-08-09 ENCOUNTER — Telehealth: Payer: Self-pay | Admitting: Oncology

## 2020-08-09 ENCOUNTER — Encounter: Payer: Self-pay | Admitting: Radiology

## 2020-08-09 ENCOUNTER — Other Ambulatory Visit: Payer: Self-pay

## 2020-08-09 ENCOUNTER — Other Ambulatory Visit: Payer: Self-pay | Admitting: *Deleted

## 2020-08-09 ENCOUNTER — Telehealth: Payer: Self-pay | Admitting: Pharmacy Technician

## 2020-08-09 DIAGNOSIS — C7800 Secondary malignant neoplasm of unspecified lung: Secondary | ICD-10-CM | POA: Diagnosis present

## 2020-08-09 DIAGNOSIS — Z1629 Resistance to other single specified antibiotic: Secondary | ICD-10-CM | POA: Diagnosis present

## 2020-08-09 DIAGNOSIS — R6520 Severe sepsis without septic shock: Secondary | ICD-10-CM | POA: Diagnosis present

## 2020-08-09 DIAGNOSIS — T8579XA Infection and inflammatory reaction due to other internal prosthetic devices, implants and grafts, initial encounter: Principal | ICD-10-CM | POA: Diagnosis present

## 2020-08-09 DIAGNOSIS — L03313 Cellulitis of chest wall: Secondary | ICD-10-CM | POA: Diagnosis present

## 2020-08-09 DIAGNOSIS — F419 Anxiety disorder, unspecified: Secondary | ICD-10-CM | POA: Diagnosis present

## 2020-08-09 DIAGNOSIS — K5903 Drug induced constipation: Secondary | ICD-10-CM | POA: Diagnosis present

## 2020-08-09 DIAGNOSIS — C782 Secondary malignant neoplasm of pleura: Secondary | ICD-10-CM | POA: Diagnosis present

## 2020-08-09 DIAGNOSIS — J910 Malignant pleural effusion: Secondary | ICD-10-CM | POA: Diagnosis present

## 2020-08-09 DIAGNOSIS — F32A Depression, unspecified: Secondary | ICD-10-CM | POA: Diagnosis present

## 2020-08-09 DIAGNOSIS — Z79899 Other long term (current) drug therapy: Secondary | ICD-10-CM | POA: Diagnosis not present

## 2020-08-09 DIAGNOSIS — J869 Pyothorax without fistula: Secondary | ICD-10-CM | POA: Diagnosis present

## 2020-08-09 DIAGNOSIS — F988 Other specified behavioral and emotional disorders with onset usually occurring in childhood and adolescence: Secondary | ICD-10-CM | POA: Diagnosis present

## 2020-08-09 DIAGNOSIS — Z8249 Family history of ischemic heart disease and other diseases of the circulatory system: Secondary | ICD-10-CM | POA: Diagnosis not present

## 2020-08-09 DIAGNOSIS — L02213 Cutaneous abscess of chest wall: Secondary | ICD-10-CM | POA: Diagnosis present

## 2020-08-09 DIAGNOSIS — Y828 Other medical devices associated with adverse incidents: Secondary | ICD-10-CM | POA: Diagnosis present

## 2020-08-09 DIAGNOSIS — T402X5A Adverse effect of other opioids, initial encounter: Secondary | ICD-10-CM | POA: Diagnosis present

## 2020-08-09 DIAGNOSIS — A4101 Sepsis due to Methicillin susceptible Staphylococcus aureus: Secondary | ICD-10-CM | POA: Diagnosis present

## 2020-08-09 DIAGNOSIS — C50411 Malignant neoplasm of upper-outer quadrant of right female breast: Secondary | ICD-10-CM | POA: Diagnosis present

## 2020-08-09 DIAGNOSIS — Z87891 Personal history of nicotine dependence: Secondary | ICD-10-CM

## 2020-08-09 DIAGNOSIS — N61 Mastitis without abscess: Secondary | ICD-10-CM | POA: Diagnosis present

## 2020-08-09 DIAGNOSIS — Z888 Allergy status to other drugs, medicaments and biological substances status: Secondary | ICD-10-CM

## 2020-08-09 DIAGNOSIS — Z79810 Long term (current) use of selective estrogen receptor modulators (SERMs): Secondary | ICD-10-CM

## 2020-08-09 DIAGNOSIS — J90 Pleural effusion, not elsewhere classified: Secondary | ICD-10-CM | POA: Diagnosis present

## 2020-08-09 DIAGNOSIS — R059 Cough, unspecified: Secondary | ICD-10-CM | POA: Diagnosis not present

## 2020-08-09 DIAGNOSIS — A419 Sepsis, unspecified organism: Secondary | ICD-10-CM | POA: Diagnosis present

## 2020-08-09 DIAGNOSIS — Z803 Family history of malignant neoplasm of breast: Secondary | ICD-10-CM | POA: Diagnosis not present

## 2020-08-09 DIAGNOSIS — Z20822 Contact with and (suspected) exposure to covid-19: Secondary | ICD-10-CM | POA: Diagnosis present

## 2020-08-09 DIAGNOSIS — I9789 Other postprocedural complications and disorders of the circulatory system, not elsewhere classified: Secondary | ICD-10-CM | POA: Diagnosis not present

## 2020-08-09 DIAGNOSIS — J948 Other specified pleural conditions: Secondary | ICD-10-CM | POA: Diagnosis present

## 2020-08-09 DIAGNOSIS — Z170 Estrogen receptor positive status [ER+]: Secondary | ICD-10-CM

## 2020-08-09 DIAGNOSIS — J9811 Atelectasis: Secondary | ICD-10-CM | POA: Diagnosis present

## 2020-08-09 DIAGNOSIS — D75839 Thrombocytosis, unspecified: Secondary | ICD-10-CM | POA: Diagnosis not present

## 2020-08-09 LAB — URINALYSIS, COMPLETE (UACMP) WITH MICROSCOPIC
Bilirubin Urine: NEGATIVE
Glucose, UA: NEGATIVE mg/dL
Hgb urine dipstick: NEGATIVE
Ketones, ur: NEGATIVE mg/dL
Leukocytes,Ua: NEGATIVE
Nitrite: NEGATIVE
Protein, ur: NEGATIVE mg/dL
Specific Gravity, Urine: 1.017 (ref 1.005–1.030)
pH: 5 (ref 5.0–8.0)

## 2020-08-09 LAB — CBC WITH DIFFERENTIAL/PLATELET
Abs Immature Granulocytes: 0.14 10*3/uL — ABNORMAL HIGH (ref 0.00–0.07)
Basophils Absolute: 0.1 10*3/uL (ref 0.0–0.1)
Basophils Relative: 0 %
Eosinophils Absolute: 0.1 10*3/uL (ref 0.0–0.5)
Eosinophils Relative: 0 %
HCT: 35.1 % — ABNORMAL LOW (ref 36.0–46.0)
Hemoglobin: 11.9 g/dL — ABNORMAL LOW (ref 12.0–15.0)
Immature Granulocytes: 1 %
Lymphocytes Relative: 3 %
Lymphs Abs: 0.9 10*3/uL (ref 0.7–4.0)
MCH: 31.5 pg (ref 26.0–34.0)
MCHC: 33.9 g/dL (ref 30.0–36.0)
MCV: 92.9 fL (ref 80.0–100.0)
Monocytes Absolute: 1 10*3/uL (ref 0.1–1.0)
Monocytes Relative: 4 %
Neutro Abs: 25 10*3/uL — ABNORMAL HIGH (ref 1.7–7.7)
Neutrophils Relative %: 92 %
Platelets: 479 10*3/uL — ABNORMAL HIGH (ref 150–400)
RBC: 3.78 MIL/uL — ABNORMAL LOW (ref 3.87–5.11)
RDW: 12.1 % (ref 11.5–15.5)
Smear Review: NORMAL
WBC: 27.2 10*3/uL — ABNORMAL HIGH (ref 4.0–10.5)
nRBC: 0 % (ref 0.0–0.2)

## 2020-08-09 LAB — COMPREHENSIVE METABOLIC PANEL
ALT: 14 U/L (ref 0–44)
AST: 27 U/L (ref 15–41)
Albumin: 3.4 g/dL — ABNORMAL LOW (ref 3.5–5.0)
Alkaline Phosphatase: 54 U/L (ref 38–126)
Anion gap: 9 (ref 5–15)
BUN: 9 mg/dL (ref 6–20)
CO2: 23 mmol/L (ref 22–32)
Calcium: 9 mg/dL (ref 8.9–10.3)
Chloride: 100 mmol/L (ref 98–111)
Creatinine, Ser: 0.63 mg/dL (ref 0.44–1.00)
GFR, Estimated: >60 mL/min (ref >60)
Glucose, Bld: 226 mg/dL — ABNORMAL HIGH (ref 70–99)
Potassium: 3.3 mmol/L — ABNORMAL LOW (ref 3.5–5.1)
Sodium: 132 mmol/L — ABNORMAL LOW (ref 135–145)
Total Bilirubin: 0.6 mg/dL (ref 0.3–1.2)
Total Protein: 7.4 g/dL (ref 6.5–8.1)

## 2020-08-09 LAB — POC URINE PREG, ED: Preg Test, Ur: NEGATIVE

## 2020-08-09 LAB — RESP PANEL BY RT-PCR (FLU A&B, COVID) ARPGX2
Influenza A by PCR: NEGATIVE
Influenza B by PCR: NEGATIVE
SARS Coronavirus 2 by RT PCR: NEGATIVE

## 2020-08-09 LAB — LACTIC ACID, PLASMA: Lactic Acid, Venous: 2.5 mmol/L (ref 0.5–1.9)

## 2020-08-09 IMAGING — CR DG CHEST 2V
2 series · 2 of 2 positions shown · non-contrast
Comparison: [DATE], CT [DATE]

CLINICAL DATA: Chest tube fever

EXAM:
CHEST - 2 VIEW

[chest pa]
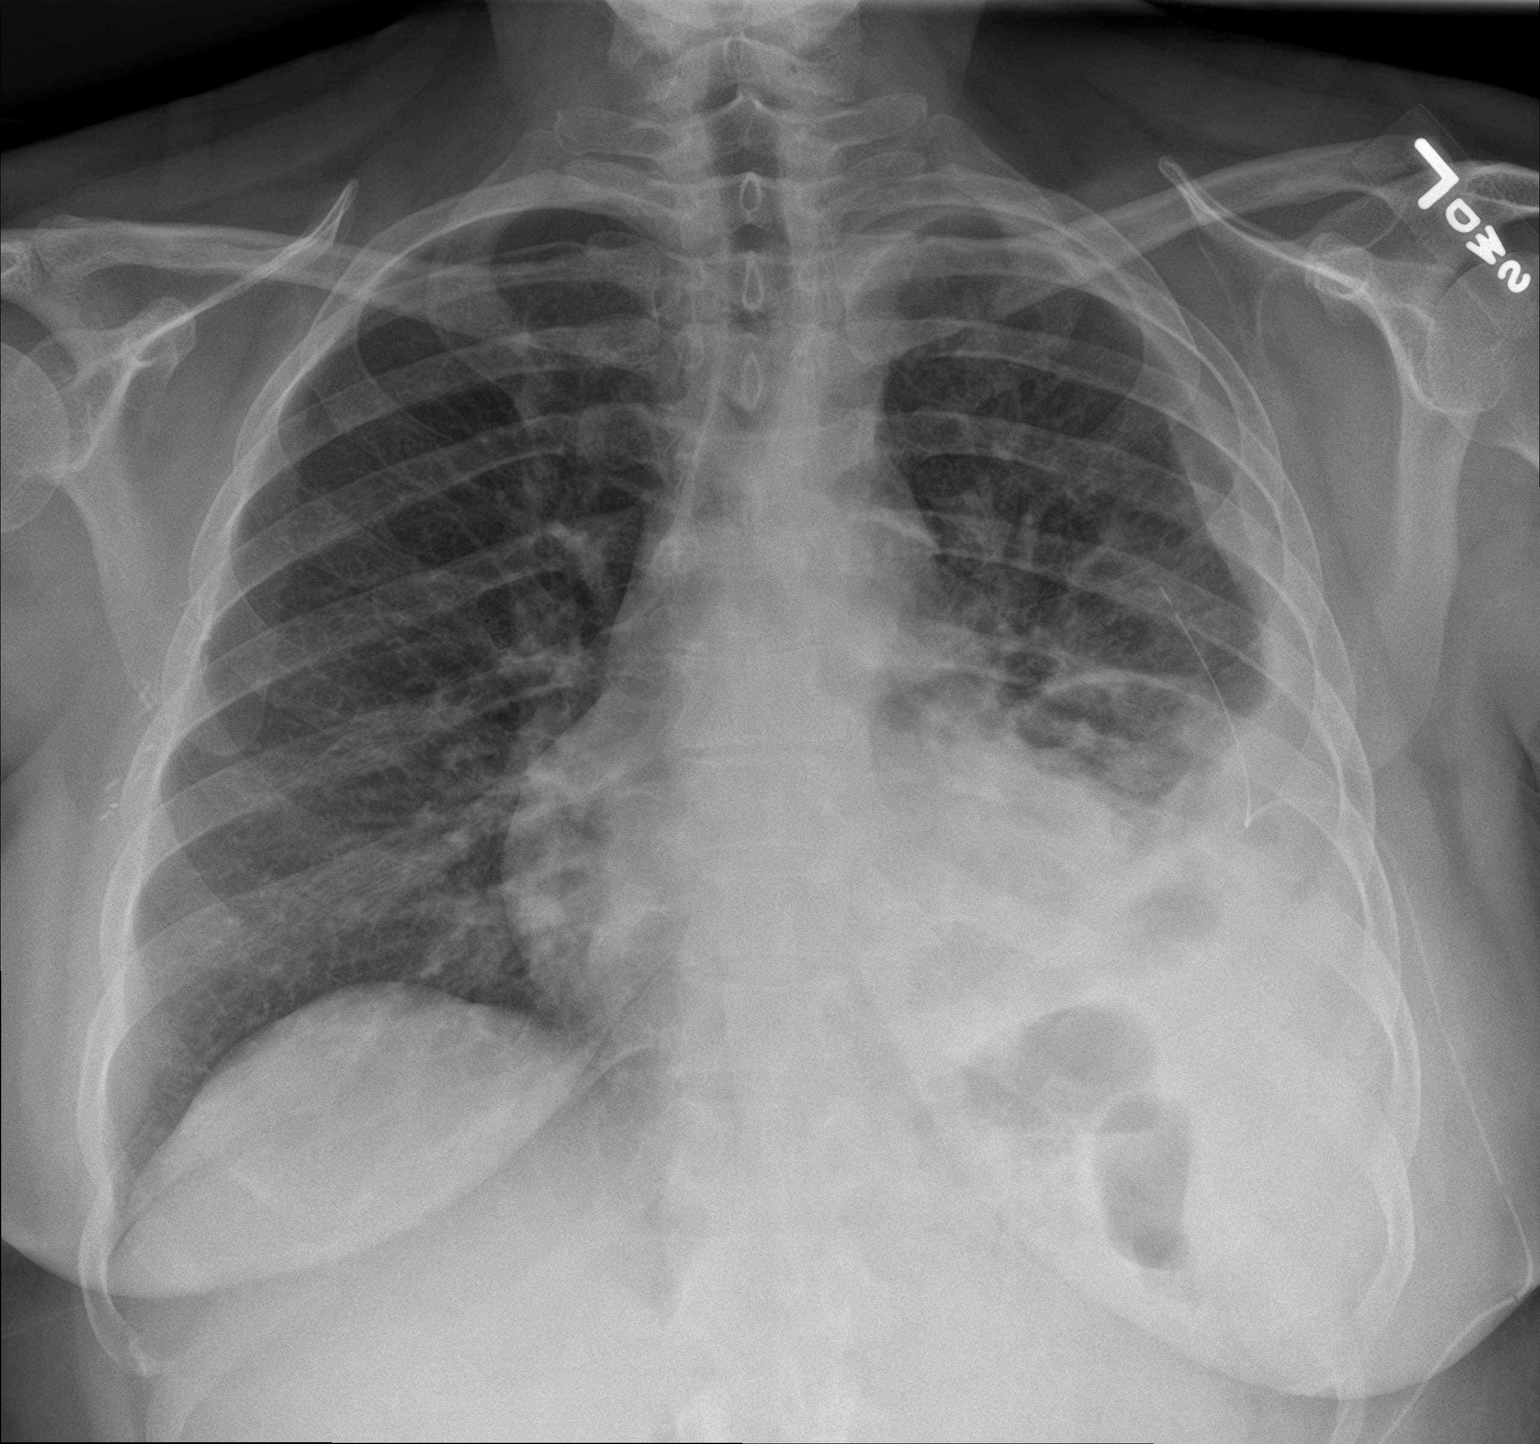

[chest lat]
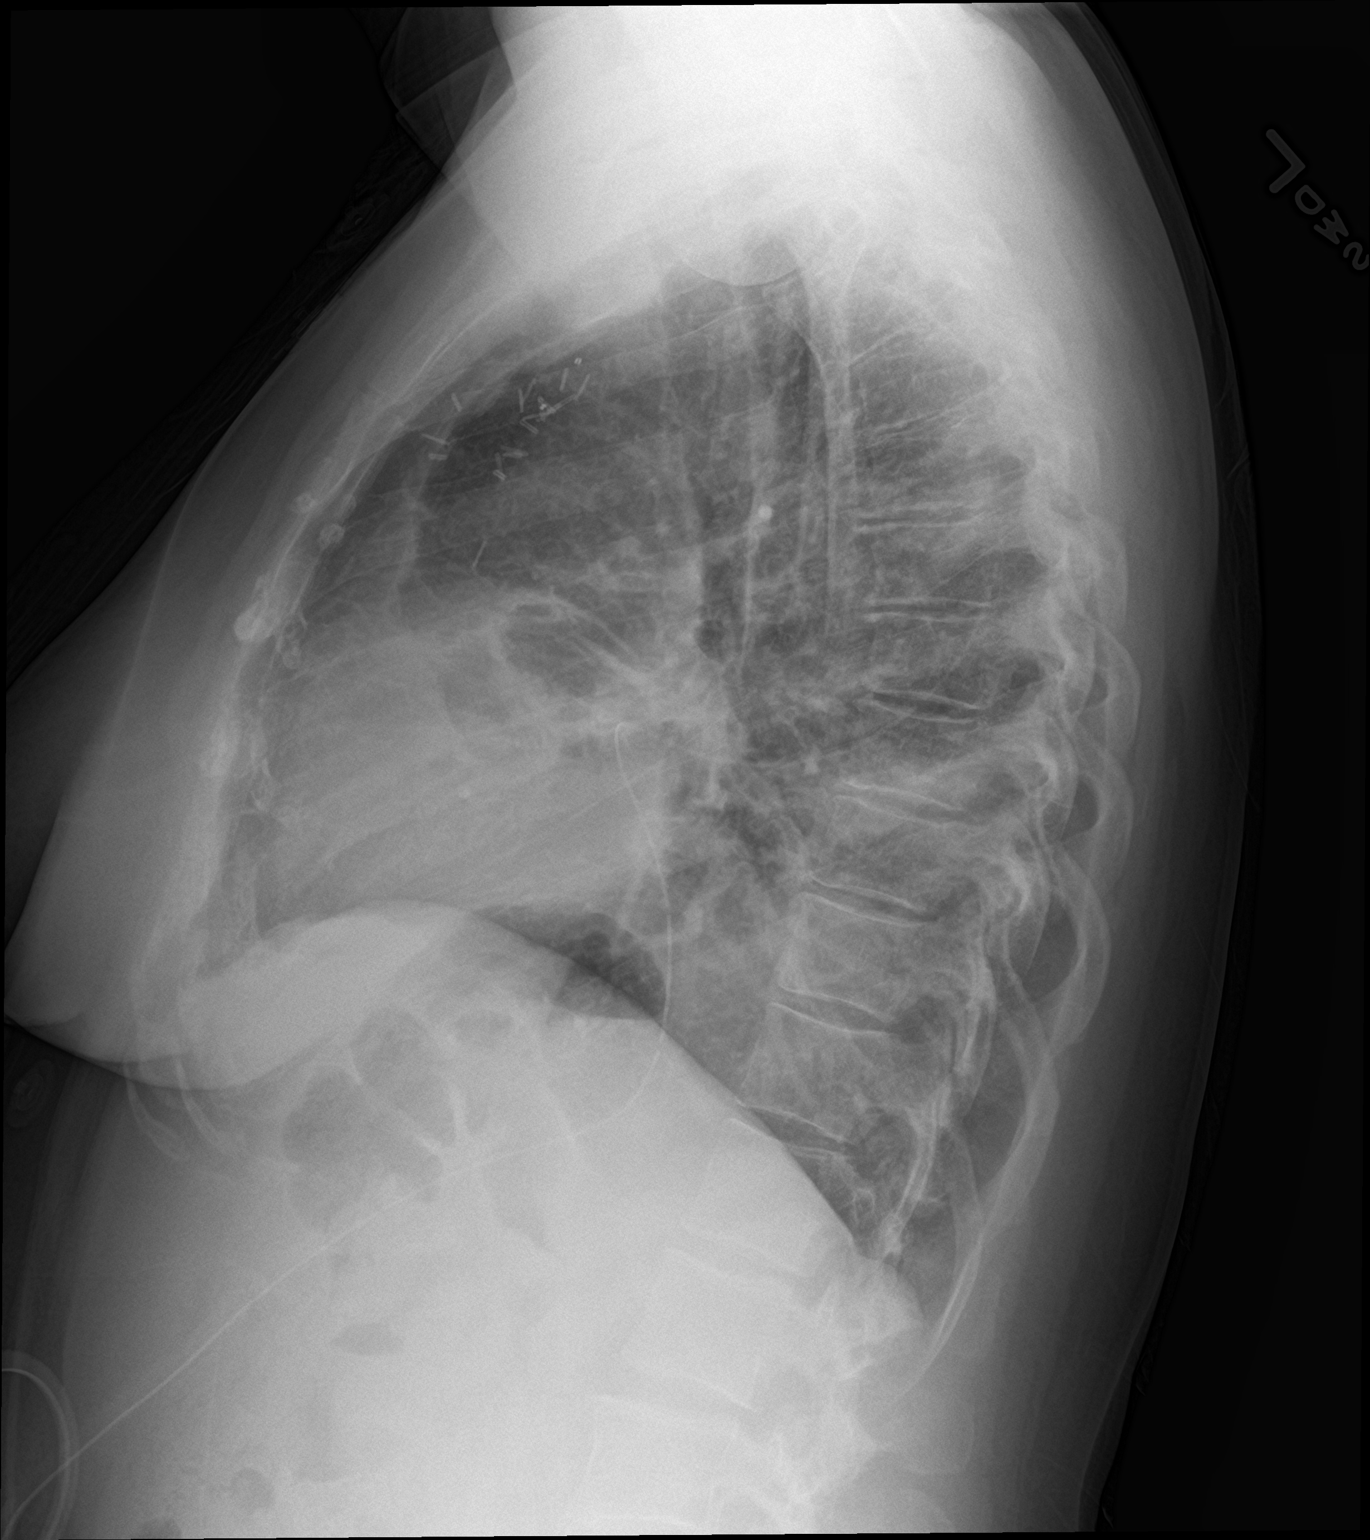

[2 of 2 positions shown; findings below may reference images not displayed]

FINDINGS: Left lower chest drainage catheter slightly retracted with removal
of previously noted additional pleural drainage catheter. Right lung
grossly clear. Slightly improved aeration left base with residual
left pleural disease and airspace disease at left base. Normal
cardiac size.
IMPRESSION: Slightly improved aeration at left base compared to prior
radiograph. Otherwise no significant change in residual left pleural
effusion/disease and mild airspace disease at left base

## 2020-08-09 IMAGING — CT CT CHEST W/ CM
2 of 3 series · 15 of 36 positions shown, 18 images · IV contrast (omnipaque)
Comparison: [DATE]

CLINICAL DATA: Sepsis. Left PleurX in place. Signs of cellulitis at
access site.

EXAM:
CT CHEST WITH CONTRAST
TECHNIQUE: Multidetector CT imaging of the chest was performed during
intravenous contrast administration.
CONTRAST:  50mL OMNIPAQUE IOHEXOL 350 MG/ML SOLN

[Series 2: axial st · axial · 0.78mm/px · z∈[-457,-195]mm · 12 of 155 slices shown, 15 images]
[im 12/155  mediastinal]
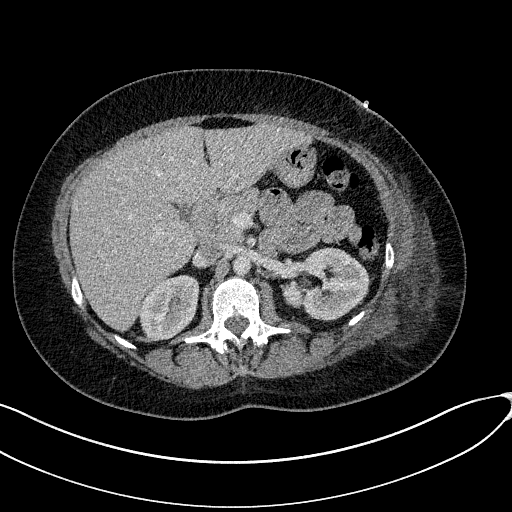
[im 12/155  lung]
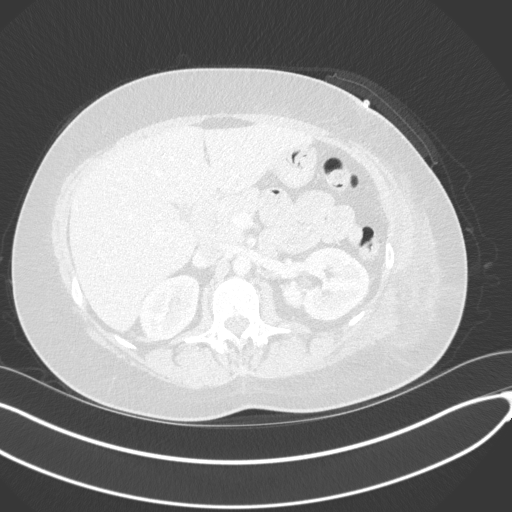
[im 23/155  lung]
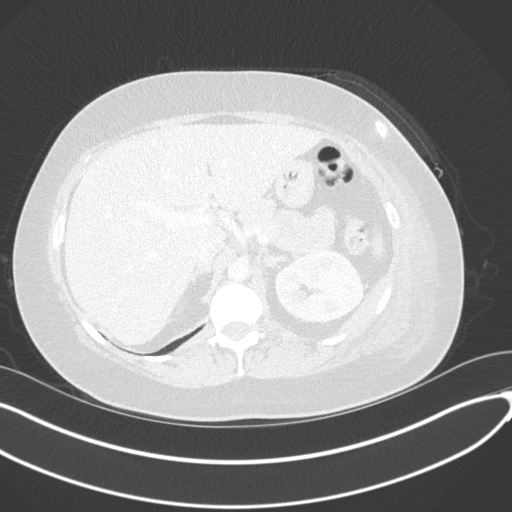
[im 35/155  lung]
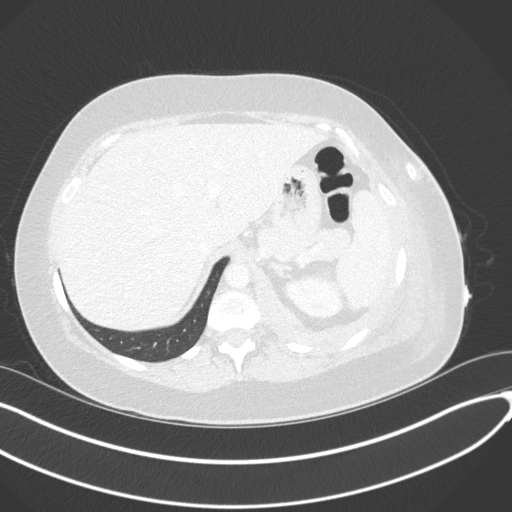
[im 46/155  lung]
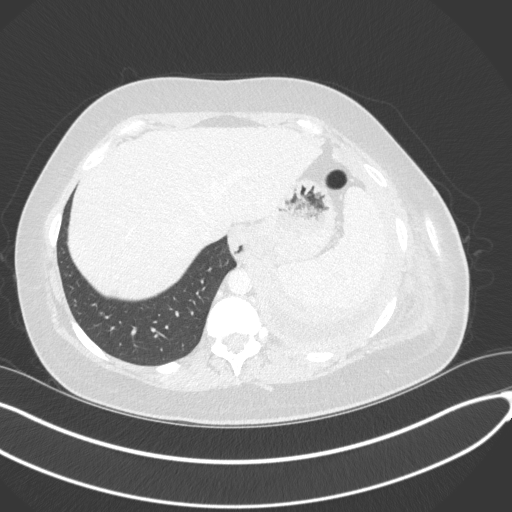
[im 58/155  mediastinal]
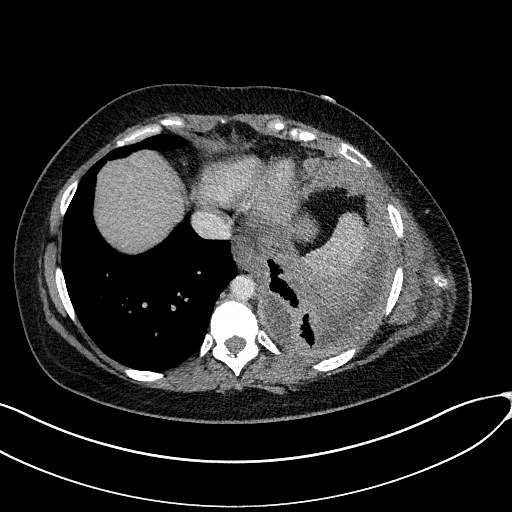
[im 58/155  lung]
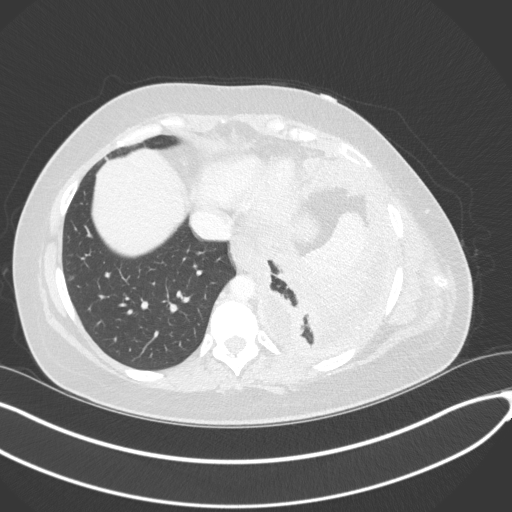
[im 69/155  lung]
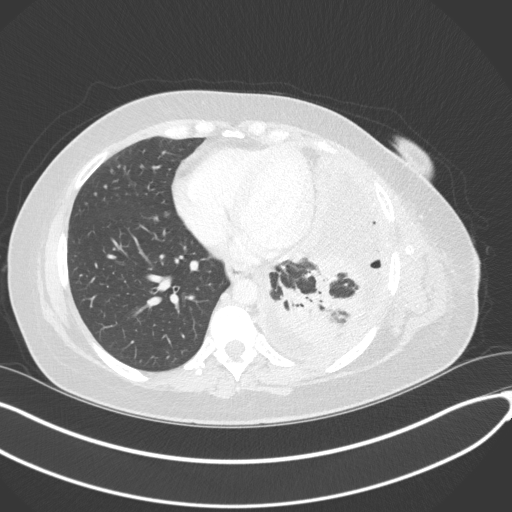
[im 86/155  lung]
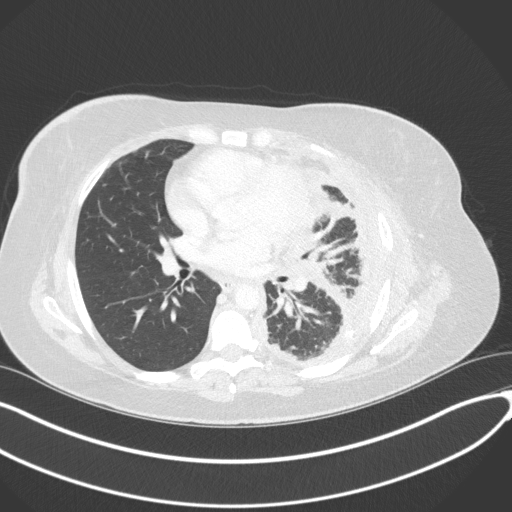
[im 97/155  lung]
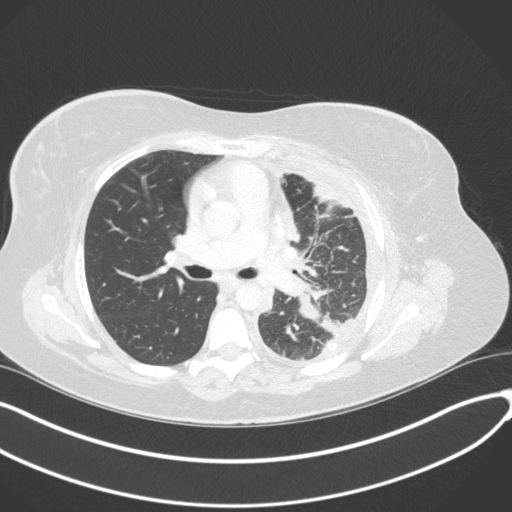
[im 109/155  mediastinal]
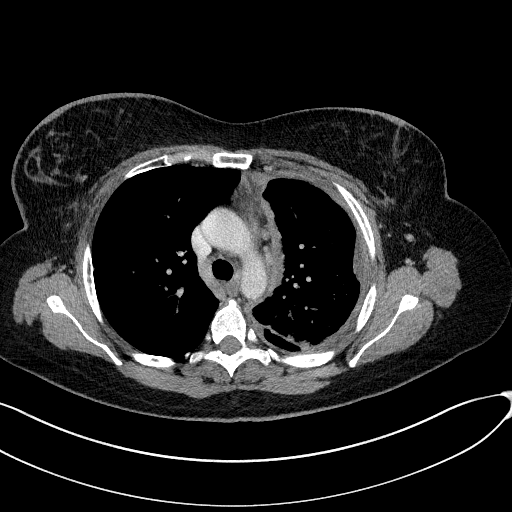
[im 109/155  lung]
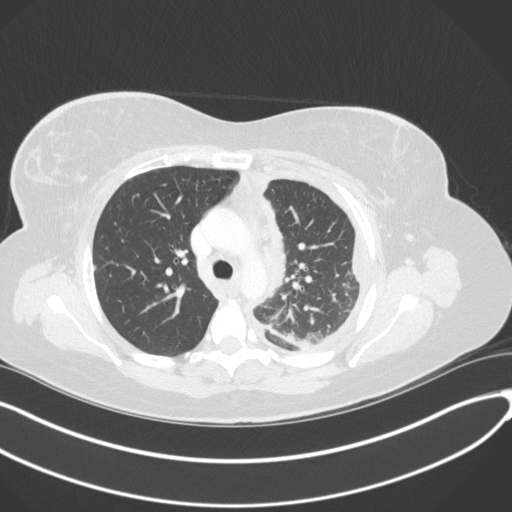
[im 120/155  lung]
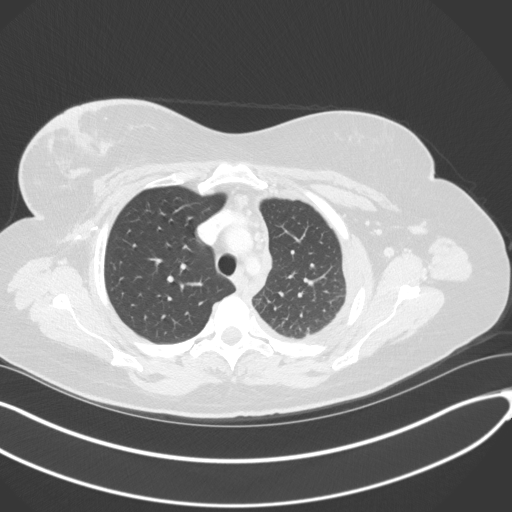
[im 132/155  lung]
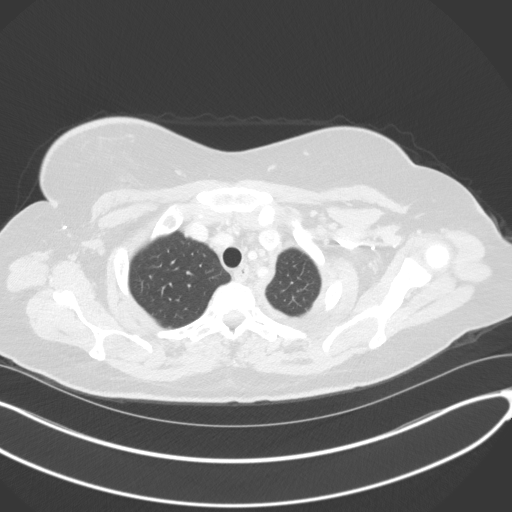
[im 143/155  lung]
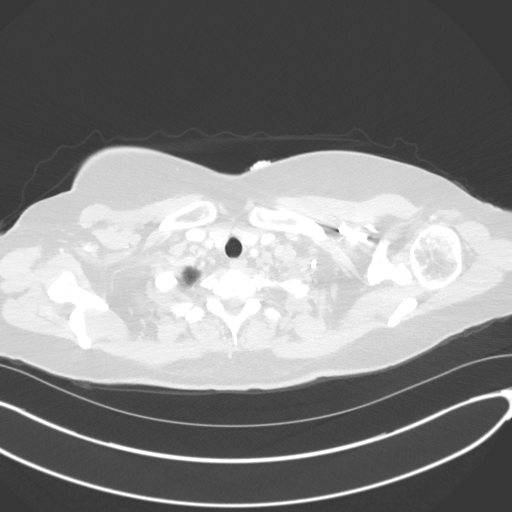

[Series 5: coronal · coronal · 0.61mm/px · 3 of 141 slices shown]
[im 29/141  lung]
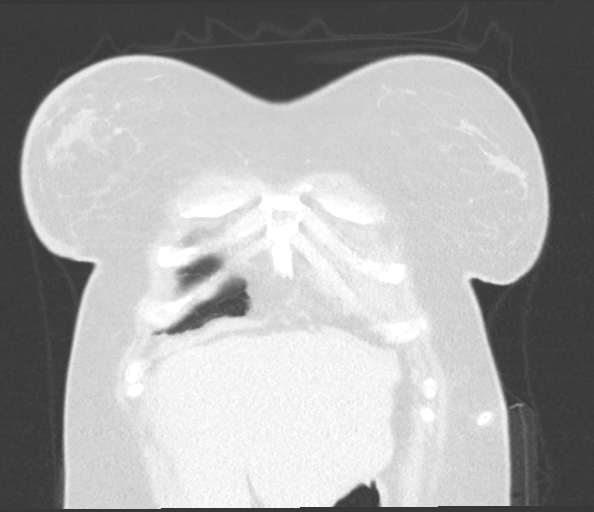
[im 57/141  lung]
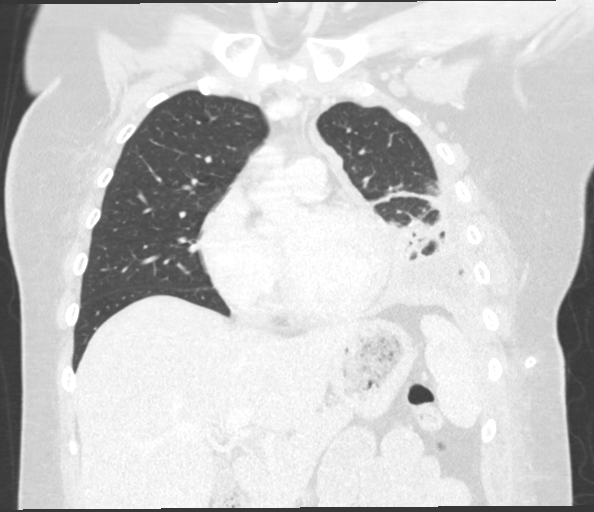
[im 85/141  lung]
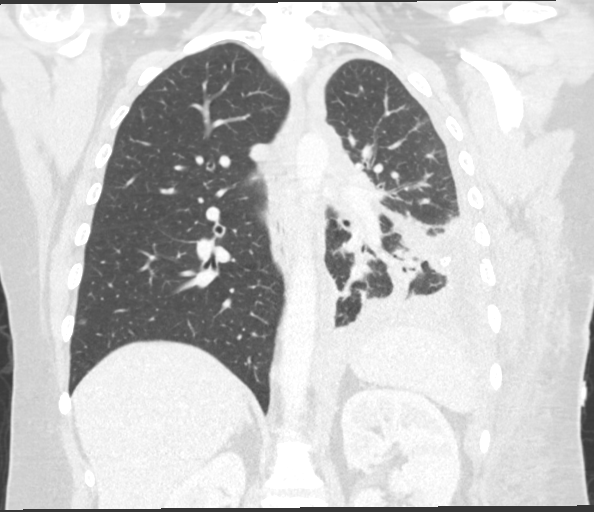

[15 of 36 positions shown; findings below may reference images not displayed]

FINDINGS: Cardiovascular: Heart is normal size. Aorta is normal caliber.

Mediastinum/Nodes: No mediastinal, hilar, or axillary adenopathy.
Trachea and esophagus are unremarkable. Thyroid unremarkable.

Lungs/Pleura: Left PleurX catheter is in place in the left lower
hemithorax. Loculated left hydropneumothorax present. Overall,
amount of fluid has decreased since prior study. Airspace disease
throughout the lingula and left lower lobe, similar to prior study.
Right lung clear.

Upper Abdomen: No acute findings

Musculoskeletal: There is stranding in the left lateral subcutaneous
soft tissues in the lower left chest and upper abdomen/flank region
which could reflect cellulitis. No focal fluid collections. No acute
bony abnormality.
IMPRESSION: PleurX catheter in place on the left with loculated left
hydropneumothorax. Overall, the fusion is smaller in size when
compared to prior study.

Continued areas of consolidation in the lingula and left lower lobe
concerning for pneumonia, unchanged.

Stranding in the subcutaneous soft tissues in the lateral left lower
chest wall and upper abdominal wall/flank compatible with
cellulitis.

## 2020-08-09 MED ORDER — LACTATED RINGERS IV BOLUS (SEPSIS)
2000.0000 mL | Freq: Once | INTRAVENOUS | Status: AC
  Administered 2020-08-09: 2000 mL via INTRAVENOUS

## 2020-08-09 MED ORDER — IOHEXOL 350 MG/ML SOLN
50.0000 mL | Freq: Once | INTRAVENOUS | Status: AC | PRN
  Administered 2020-08-09: 50 mL via INTRAVENOUS

## 2020-08-09 MED ORDER — HYDROCODONE-ACETAMINOPHEN 10-325 MG PO TABS: 1.0000 | ORAL_TABLET | ORAL | 0 refills | Status: DC | PRN

## 2020-08-09 MED ORDER — CEPHALEXIN 500 MG PO CAPS: 500.0000 mg | ORAL_CAPSULE | Freq: Three times a day (TID) | ORAL | 0 refills | Status: DC

## 2020-08-09 MED ORDER — SODIUM CHLORIDE 0.9 % IV SOLN
1.0000 g | Freq: Once | INTRAVENOUS | Status: AC
  Administered 2020-08-09: 1 g via INTRAVENOUS
  Filled 2020-08-09: qty 10

## 2020-08-09 MED ORDER — VANCOMYCIN HCL 1250 MG/250ML IV SOLN
1250.0000 mg | Freq: Once | INTRAVENOUS | Status: AC
  Administered 2020-08-09: 1250 mg via INTRAVENOUS
  Filled 2020-08-09: qty 250

## 2020-08-09 MED ORDER — LACTATED RINGERS IV SOLN: INTRAVENOUS | Status: DC

## 2020-08-09 MED ORDER — PALBOCICLIB 125 MG PO TABS
125.0000 mg | ORAL_TABLET | Freq: Every day | ORAL | 0 refills | Status: DC
Start: 2020-08-09 — End: 2020-09-04
  Filled 2020-08-09: qty 21, 21d supply, fill #0
  Filled 2020-08-09: qty 21, 28d supply, fill #0

## 2020-08-09 MED ORDER — ONDANSETRON HCL 4 MG/2ML IJ SOLN
4.0000 mg | Freq: Once | INTRAMUSCULAR | Status: AC
  Administered 2020-08-09: 4 mg via INTRAVENOUS
  Filled 2020-08-09: qty 2

## 2020-08-09 MED ORDER — MORPHINE SULFATE (PF) 4 MG/ML IV SOLN
4.0000 mg | Freq: Once | INTRAVENOUS | Status: AC
  Administered 2020-08-09: 4 mg via INTRAVENOUS
  Filled 2020-08-09: qty 1

## 2020-08-09 MED ORDER — LETROZOLE 2.5 MG PO TABS: 2.5000 mg | ORAL_TABLET | Freq: Every day | ORAL | 3 refills | Status: DC

## 2020-08-09 NOTE — Progress Notes (Signed)
CODE SEPSIS - PHARMACY COMMUNICATION  **Broad Spectrum Antibiotics should be administered within 1 hour of Sepsis diagnosis**  Time Code Sepsis Called/Page Received: 18:37  Antibiotics Ordered: Ceftriaxone and Vancomycin  Time of 1st antibiotic administration: Ceftriaxone given at 18:31  Additional action taken by pharmacy: n/a  If necessary, Name of Provider/Nurse Contacted: n/a    Vira Blanco ,PharmD Clinical Pharmacist  08/09/2020  6:44 PM

## 2020-08-09 NOTE — Telephone Encounter (Signed)
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  Labs from 07/30/20 assessed, WBC 13.5, Hgb 11.7, PLT 290, LFTs wnl. Prescription dose and frequency assessed and are appropriate.  Current medication list in Epic reviewed, 1 DDI with alprazolam identified: Leslee Home is a weak CYP3A4 inhibitor and may increase the serum concentration of alprazolam. Patient reported not taking. Monitor for increased sedation and lethargy if restarted.  Evaluated chart and no patient barriers to medication adherence identified.   Prescription has been e-scribed to the [facility] for benefits analysis and approval.  Oral Oncology Clinic will continue to follow for insurance authorization, copayment issues, initial counseling and start date.  Patient agreed to treatment on 08/08/20 per MD documentation.  Wynelle Cleveland, PharmD Candidate [facility] [phone]  08/09/2020 11:32 AM

## 2020-08-09 NOTE — ED Notes (Signed)
Pt accepted to [facility] 4E22 per Ruby. Call [phone] for report. [facility] to transport when a truck is available.

## 2020-08-09 NOTE — ED Provider Notes (Signed)
[facility] Emergency Department Provider Note ____________________________________________   Event Date/Time   First MD Initiated Contact with Patient 08/09/20 1802     (approximate)  I have reviewed the triage vital signs and the nursing notes.  HISTORY  Chief Complaint Wound Infection   HPI Kaylee Romero is a 40 y.o. 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  This was done at [facility] with her CT surgery department, by 25th.  Dr. Kipp Brood Patient had a virtual visit with her CT surgeon earlier today and was prescribed Keflex, which she has taken 2 doses of.  Patient presents to the ED at the direction of her CT surgeon for evaluation of infection and sepsis.  Patient reports 24-48 hours of generalized symptoms of weakness, malaise, subjective fevers and chills, as well as increased pain at her Pleurx catheter insertion site.  She reports a red rash that is tender at the site of this insertion site.  Denies any falls or injuries.  Denies any productive cough or shortness of breath.  Father presents with the patient and provides some additional history.  He indicates that the output from the Pleurx catheter has been less over the past week or so.  Past Medical History:  Diagnosis Date   Anxiety    Family history of breast cancer    HSV (herpes simplex virus) anogenital infection    positive titer only    Patient Active Problem List   Diagnosis Date Noted   Sepsis ([place]) 08/09/2020   Pleural effusion on left 07/29/2020   Hx of breast surgery 02/28/2019   Port-A-Cath in place 02/02/2019   Genetic testing 10/13/2018   Family history of breast cancer    Malignant neoplasm of upper-outer quadrant of right breast in female, estrogen receptor positive ([place]) 09/29/2018   Gestational hypertension 06/18/2017    Pregnancy 06/18/2017   Low back pain 01/14/2013   ADD (attention deficit disorder) 03/24/2012   Overweight (BMI 25.0-29.9) 01/14/2012    Past Surgical History:  Procedure Laterality Date   BREAST LUMPECTOMY WITH AXILLARY LYMPH NODE BIOPSY Right 11/21/2018   Procedure: RIGHT BREAST LUMPECTOMY WITH SENTINEL LYMPH NODE BIOPSY;  Surgeon: Jovita Kussmaul, MD;  Location: [place];  Service: General;  Laterality: Right;   BREAST REDUCTION SURGERY Bilateral 11/21/2018   Procedure: BILATERAL MAMMARY REDUCTION  (BREAST);  Surgeon: Wallace Going, DO;  Location: [place];  Service: Plastics;  Laterality: Bilateral;   CHEST TUBE INSERTION Left 07/29/2020   Procedure: INSERTION PLEURAL DRAINAGE CATHETER;  Surgeon: Lajuana Matte, MD;  Location: [place];  Service: Thoracic;  Laterality: Left;   PLEURAL BIOPSY Left 07/29/2020   Procedure: PLEURAL BIOPSY;  Surgeon: Lajuana Matte, MD;  Location: [place];  Service: Thoracic;  Laterality: Left;   PLEURAL EFFUSION DRAINAGE Left 07/29/2020   Procedure: DRAINAGE OF PLEURAL EFFUSION;  Surgeon: Lajuana Matte, MD;  Location: [place];  Service: Thoracic;  Laterality: Left;   PORT-A-CATH REMOVAL N/A 07/06/2019   Procedure: REMOVAL PORT-A-CATH;  Surgeon: Jovita Kussmaul, MD;  Location: [place];  Service: General;  Laterality: N/A;   PORTACATH PLACEMENT N/A 11/21/2018   Procedure: INSERTION LEFT PORT-A-CATH WITH ULTRASOUND GUIDANCE;  Surgeon: Jovita Kussmaul, MD;  Location: [place];  Service: General;  Laterality: N/A;   [place]  ASSISTED THORACOSCOPY Left 07/29/2020   Procedure: VIDEO ASSISTED THORACOSCOPY;  Surgeon: Lajuana Matte, MD;  Location: [facility];  Service: Thoracic;  Laterality: Left;   WISDOM TOOTH EXTRACTION      Prior to Admission medications   Medication Sig Start Date End Date Taking? Authorizing Provider  HYDROcodone-acetaminophen (NORCO) 10-325 MG tablet Take 1 tablet by mouth every 4  (four) hours as needed for moderate pain. 08/09/20   Lajuana Matte, MD  ALPRAZolam Duanne Moron) 0.5 MG tablet Take 0.5 mg by mouth at bedtime as needed for anxiety. Patient not taking: Reported on 08/08/2020    [provider]  amphetamine-dextroamphetamine (ADDERALL XR) 30 MG 24 hr capsule Take 30 mg by mouth daily.    [provider]  amphetamine-dextroamphetamine (ADDERALL) 10 MG tablet Take 10 mg by mouth daily in the afternoon.    [provider]  Ascorbic Acid (VITAMIN C) 500 MG CAPS Take 500 mg by mouth daily.    [provider]  cephALEXin (KEFLEX) 500 MG capsule Take 1 capsule (500 mg total) by mouth 3 (three) times daily. 08/09/20   Lajuana Matte, MD  HYDROcodone-acetaminophen (NORCO/VICODIN) 5-325 MG tablet Take 1 tablet by mouth every 6 (six) hours as needed for moderate pain. Patient not taking: Reported on 08/08/2020 07/30/20   Nani Skillern, PA-C  hydrOXYzine (ATARAX/VISTARIL) 10 MG tablet Take 10 mg by mouth at bedtime as needed for anxiety or itching (Sleep).    [provider]  ibuprofen (ADVIL) 200 MG tablet Take 400 mg by mouth every 6 (six) hours as needed for mild pain.    [provider]  letrozole (FEMARA) 2.5 MG tablet Take 1 tablet (2.5 mg total) by mouth daily. 08/09/20   Lloyd Huger, MD  Multiple Vitamin (MULTIVITAMIN WITH MINERALS) TABS tablet Take 1 tablet by mouth daily.    [provider]  naproxen sodium (ALEVE) 220 MG tablet Take 1 tablet (220 mg total) by mouth 3 (three) times daily with meals. 07/31/20   Nani Skillern, PA-C  palbociclib (IBRANCE) 125 MG tablet Take 1 tablet (125 mg total) by mouth daily. Take for 21 days on, 7 days off, repeat every 28 days. 08/09/20   Lloyd Huger, MD  tacrolimus (PROTOPIC) 0.1 % ointment Apply 1 application topically daily. 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    [provider]  venlafaxine XR (EFFEXOR-XR) 75 MG 24 hr capsule Take 1 capsule (75 mg total) by mouth at bedtime. 07/29/20   Nani Skillern, PA-C  vitamin B-12 (CYANOCOBALAMIN) 1000 MCG tablet Take 1,000 mcg by mouth daily.    [provider]    Allergies Betadine [povidone-iodine]  Family History  Problem Relation Age of Onset   Hypertension Father    Alcohol abuse Paternal Grandfather    Heart disease Paternal Grandfather    Alcohol abuse Maternal Grandfather    Heart disease Maternal Grandmother    Breast cancer Other     Social History Social History   Tobacco Use   Smoking status: Former    Packs/day: 0.50    Years: 20.00    Pack years: 10.00    Types: Cigarettes    Start date: 01/13/2002    Quit date: 10/17/2016    Years since quitting: 3.8   Smokeless tobacco: Never  Vaping Use  Vaping Use: Never used  Substance Use Topics   Alcohol use: Yes    Comment: Socially   Drug use: No    Review of Systems  Constitutional: Positive for subjective fever/chills Eyes: No visual changes. ENT: No sore throat. Cardiovascular: Denies chest pain. Respiratory: Denies shortness of breath. Gastrointestinal: No abdominal pain.  No nausea, no vomiting.  No diarrhea.  No constipation. Genitourinary: Negative for dysuria. Musculoskeletal: Negative for back pain. Skin: Negative for rash. Positive for painful atraumatic red rash at the Pleurx catheter insertion site to left flank Neurological: Negative for headaches, focal weakness or numbness.  ____________________________________________   PHYSICAL EXAM:  VITAL SIGNS: Vitals:   08/09/20 1736 08/09/20 1913  BP: 104/79 105/71  Pulse: (!) 124 (!) 118  Resp: (!) 22 18  Temp: 100.1 F (37.8 C)   SpO2: 95% 98%    Constitutional: Alert and oriented.  Appears uncomfortable, but in no acute distress.   Conversational Eyes: Conjunctivae are normal. PERRL. EOMI. Head: Atraumatic. Nose: No congestion/rhinnorhea. Mouth/Throat: Mucous membranes are dry.  Oropharynx non-erythematous. Neck: No stridor. No cervical spine tenderness to palpation. Cardiovascular: Tachycardic rate, regular rhythm. Grossly normal heart sounds.  Good peripheral circulation. Respiratory: Normal respiratory effort.  No retractions. Lungs CTAB. Gastrointestinal: Soft , nondistended, nontender to palpation. No CVA tenderness. Musculoskeletal: No lower extremity tenderness nor edema.  No joint effusions. No signs of acute trauma. .  Pleurx catheter insertion site to the left anterior flank.  I remove a clean Mepilex dressing.  Insertion site has surrounding erythema and induration without fluctuance or purulent discharge.  There is some further erythema and induration extending laterally in a dermatomal fashion that is consistent with cellulitis.  No vesicular lesions to suggest herpes zoster. Neurologic:  Normal speech and language. No gross focal neurologic deficits are appreciated. No gait instability noted. Skin:  Skin is warm, dry and intact. No rash noted. Psychiatric: Mood and affect are normal. Speech and behavior are normal. ____________________________________________   LABS (all labs ordered are listed, but only abnormal results are displayed)  Labs Reviewed  LACTIC ACID, PLASMA - Abnormal; Notable for the following components:      Result Value   Lactic Acid, Venous 2.5 (*)    All other components within normal limits  COMPREHENSIVE METABOLIC PANEL - Abnormal; Notable for the following components:   Sodium 132 (*)    Potassium 3.3 (*)    Glucose, Bld 226 (*)    Albumin 3.4 (*)    All other components within normal limits  CBC WITH DIFFERENTIAL/PLATELET - Abnormal; Notable for the following components:   WBC 27.2 (*)    RBC 3.78 (*)    Hemoglobin 11.9 (*)    HCT 35.1 (*)    Platelets 479 (*)    Neutro  Abs 25.0 (*)    Abs Immature Granulocytes 0.14 (*)    All other components within normal limits  CULTURE, BLOOD (SINGLE)  RESP PANEL BY RT-PCR (FLU A&B, COVID) ARPGX2  LACTIC ACID, PLASMA  URINALYSIS, COMPLETE (UACMP) WITH MICROSCOPIC  POC URINE PREG, ED   ____________________________________________  12 Lead EKG  Sinus tachycardia with a rate of 118 bpm.  Normal axis and intervals.  No evidence of acute ischemia. ____________________________________________  RADIOLOGY  ED MD interpretation: 2 view CXR reviewed by me with good aeration of the left lungs with Pleurx catheter in place.  Tight angle and possible kinking of the tube.  Small infiltration to the left base.  CT chest pending  Official radiology  report(s): DG Chest 2 View  Result Date: 08/09/2020 CLINICAL DATA:  Chest tube fever EXAM: CHEST - 2 VIEW COMPARISON:  07/30/2020, CT 07/03/2020 FINDINGS: Left lower chest drainage catheter slightly retracted with removal of previously noted additional pleural drainage catheter. Right lung grossly clear. Slightly improved aeration left base with residual left pleural disease and airspace disease at left base. Normal cardiac size. IMPRESSION: Slightly improved aeration at left base compared to prior radiograph. Otherwise no significant change in residual left pleural effusion/disease and mild airspace disease at left base Electronically Signed   By: Donavan Foil M.D.   On: 08/09/2020 18:25    ____________________________________________   PROCEDURES and INTERVENTIONS  Procedure(s) performed (including Critical Care):  .1-3 Lead EKG Interpretation  Date/Time: 08/09/2020 7:27 PM Performed by: Vladimir Crofts, MD Authorized by: Vladimir Crofts, MD     Interpretation: abnormal     ECG rate:  118   ECG rate assessment: tachycardic     Rhythm: sinus tachycardia     Ectopy: none     Conduction: normal   .Critical Care  Date/Time: 08/09/2020 7:27 PM Performed by: Vladimir Crofts,  MD Authorized by: Vladimir Crofts, MD   Critical care provider statement:    Critical care time (minutes):  45   Critical care was necessary to treat or prevent imminent or life-threatening deterioration of the following conditions:  Sepsis   Critical care was time spent personally by me on the following activities:  Discussions with consultants, evaluation of patient's response to treatment, examination of patient, ordering and performing treatments and interventions, ordering and review of laboratory studies, ordering and review of radiographic studies, pulse oximetry, re-evaluation of patient's condition, obtaining history from patient or surrogate and review of old charts  Medications  vancomycin (VANCOREADY) IVPB 1250 mg/250 mL (1,250 mg Intravenous New Bag/Given 08/09/20 1907)  lactated ringers infusion ( Intravenous New Bag/Given 08/09/20 1847)  lactated ringers bolus 2,000 mL (2,000 mLs Intravenous New Bag/Given 08/09/20 1844)  cefTRIAXone (ROCEPHIN) 1 g in sodium chloride 0.9 % 100 mL IVPB (0 g Intravenous Stopped 08/09/20 1900)  morphine 4 MG/ML injection 4 mg (4 mg Intravenous Given 08/09/20 1844)  ondansetron (ZOFRAN) injection 4 mg (4 mg Intravenous Given 08/09/20 1844)  iohexol (OMNIPAQUE) 350 MG/ML injection 50 mL (50 mLs Intravenous Contrast Given 08/09/20 1914)    ____________________________________________   MDM / ED COURSE   40 year old woman with Pleurx catheter in place for the past couple weeks due to malignant effusions, presents to the ED with stigmata of sepsis from cellulitis from her Pleurx catheter insertion site, requiring transfer for appropriate level of care with CT surgeon availability.  She presents tachycardic with low-grade temperature, and remains hemodynamically stable without hypoxia.  She looks clinically okay without distress and she is conversational on room air.  Pleurx catheter insertion site appears cellulitic with some cellulitis extending out laterally to the  left.  No evidence of drainable abscess on examination externally.  Blood work with further stigmata of sepsis with leukocytosis and lactic acidosis.  Metabolic panel unremarkable.  We will cover with Rocephin and vancomycin.  I discussed the case with her CT surgeon, who indicates that she was supposed to check into the ED over at [facility] instead of here, he recommends medical admission with CT surgery consulting.  I further discussed the case with hospitalist over at [facility] who agrees to accept the patient in transfer.  Patient remained stable throughout her stay in the ED.  Clinical Course as of 08/09/20 1925  Fri  Aug 09, 2020  [place] dr lightfoot [DS]  [phone] Dr. Mechele Claude Hospitalist with [facility]. Accepts in transfer Dr. Clearence Ped  [DS]    Clinical Course User Index [DS] Vladimir Crofts, MD    ____________________________________________   FINAL CLINICAL IMPRESSION(S) / ED DIAGNOSES  Final diagnoses:  Sepsis without acute organ dysfunction, due to unspecified organism [facility])     ED Discharge Orders     None        Dyana Magner   Note:  This document was prepared using Dragon voice recognition software and may include unintentional dictation errors.    Vladimir Crofts, MD 08/09/20 [phone]

## 2020-08-09 NOTE — ED Notes (Signed)
EKG completed and signed by provider. 1 set of blood cultures completed and sent to lab. Primary RN notified.

## 2020-08-09 NOTE — Progress Notes (Signed)
     [address]       [place] [address]             [phone]       Patient: Home Provider: Office Consent for Telemedicine visit obtained.  Today's visit was completed via a real-time telehealth (see specific modality noted below). The patient/authorized person provided oral consent at the time of the visit to engage in a telemedicine encounter with the present provider at [facility]. The patient/authorized person was informed of the potential benefits, limitations, and risks of telemedicine. The patient/authorized person expressed understanding that the laws that protect confidentiality also apply to telemedicine. The patient/authorized person acknowledged understanding that telemedicine does not provide emergency services and that he or she would need to call 911 or proceed to the nearest hospital for help if such a need arose.   Total time spent in the clinical discussion 30 minutes.  Telehealth Modality: Phone visit (audio only)  I had a telephone visit with Kaylee Romero. Pt had multiple pain complaints.  Upper her hydrocodone to 10/'325mg'$ .  Instructed her to continue antibiotics.  Minimal CT output.  Will drain once weekly.  If output remains low, will obtain CT chest, and likely remove drain.  [name]

## 2020-08-09 NOTE — Telephone Encounter (Signed)
Oral Oncology Patient Advocate Encounter  Prior Authorization for Kaylee Romero has been approved.    PA# [id number] Effective dates: 08/09/20 through 08/09/21  Patients co-pay is $0.00.  Oral Oncology Clinic will continue to follow.   [place] Patient [name] Phone [phone] Fax [phone] 08/09/2020 10:31 AM

## 2020-08-09 NOTE — Telephone Encounter (Signed)
Oral Oncology Patient Advocate Encounter  I spoke with Mrs Kilcrease this morning to set up delivery of Ibrance.  Address verified for shipment.  Leslee Home will be filled through [facility] and mailed 08/12/20 for delivery 08/13/20. Patient instructed not to start medication when delivered but to bring to her appt on 08/15/20.    Gail will call 7-10 days before next refill is due to complete adherence call and set up delivery of medication.     [place] Patient [place] Phone [phone] Fax [phone] 08/09/2020 2:34 PM

## 2020-08-09 NOTE — Progress Notes (Deleted)
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  INTERVAL HISTORY: Patient is a 40-year-old female who underwent lumpectomy, chemotherapy, and radiation therapy in 2020 for stage II breast cancer.  She had been on tamoxifen for approximately 2 years went over the past several months she became increasingly short of breath.  Subsequent imaging, thoracentesis, biopsy revealed recurrent disease.  She is anxious, but otherwise feels well.  She has no neurologic complaints.  She denies any recent fevers or illnesses.  She has a good appetite and denies weight loss.  She has no chest pain, hemoptysis, or cough.  She denies any nausea, vomiting, constipation, or diarrhea.  She has no urinary complaints.  Patient otherwise feels well and offers no further specific complaints today.  REVIEW OF SYSTEMS:   Review of Systems  Constitutional: Negative.  Negative for fever, malaise/fatigue and weight loss.  Respiratory:  Positive for shortness of breath. Negative for cough and hemoptysis.   Cardiovascular: Negative.  Negative for chest pain and leg swelling.  Gastrointestinal: Negative.  Negative for abdominal pain.  Genitourinary: Negative.  Negative for dysuria.  Musculoskeletal: Negative.  Negative for back pain.  Skin: Negative.  Negative for rash.  Neurological: Negative.  Negative for  dizziness, focal weakness, weakness and headaches.  Psychiatric/Behavioral:  The patient is nervous/anxious.    As per HPI. Otherwise, a complete review of systems is negative.  PAST MEDICAL HISTORY: Past Medical History:  Diagnosis Date   Anxiety    Family history of breast cancer    HSV (herpes simplex virus) anogenital infection    positive titer only    PAST SURGICAL HISTORY: Past Surgical History:  Procedure Laterality Date   BREAST LUMPECTOMY WITH AXILLARY LYMPH NODE BIOPSY Right 11/21/2018   Procedure: RIGHT BREAST LUMPECTOMY WITH SENTINEL LYMPH NODE BIOPSY;  Surgeon: Toth, Paul III, MD;  Location: [facility] OR;  Service: General;  Laterality: Right;   BREAST REDUCTION SURGERY Bilateral 11/21/2018   Procedure: BILATERAL MAMMARY REDUCTION  (BREAST);  Surgeon: Dillingham, Claire S, DO;  Location: [facility] OR;  Service: Plastics;  Laterality: Bilateral;   CHEST TUBE INSERTION Left 07/29/2020   Procedure: INSERTION PLEURAL DRAINAGE CATHETER;  Surgeon: Lightfoot, Harrell O, MD;  Location: [facility] OR;  Service: Thoracic;  Laterality: Left;   PLEURAL BIOPSY Left 07/29/2020   Procedure: PLEURAL BIOPSY;  Surgeon: Lightfoot, Harrell O, MD;  Location: [facility] OR;  Service: Thoracic;  Laterality: Left;   PLEURAL EFFUSION DRAINAGE Left 07/29/2020   Procedure: DRAINAGE OF PLEURAL EFFUSION;  Surgeon: Lightfoot, Harrell O, MD;  Location: [facility] OR;  Service: Thoracic;  Laterality: Left;   PORT-A-CATH REMOVAL N/A 07/06/2019   Procedure: REMOVAL PORT-A-CATH;  Surgeon: Toth, Paul III, MD;  Location: [facility];  Service: General;  Laterality: N/A;   PORTACATH PLACEMENT N/A 11/21/2018   Procedure: INSERTION LEFT PORT-A-CATH WITH ULTRASOUND GUIDANCE;  Surgeon: Toth, Paul III, MD;  Location: [facility] OR;  Service: General;  Laterality: N/A;   TONSILLECTOMY  1987   TONSILLECTOMY       VIDEO ASSISTED THORACOSCOPY Left 07/29/2020   Procedure: VIDEO ASSISTED THORACOSCOPY;  Surgeon: Lightfoot, Harrell O, MD;  Location: [facility] OR;   Service: Thoracic;  Laterality: Left;   WISDOM TOOTH EXTRACTION      FAMILY HISTORY: Family History  Problem Relation Age of Onset   Hypertension Father    Alcohol abuse Paternal Grandfather    Heart disease Paternal Grandfather    Alcohol abuse Maternal Grandfather    Heart disease Maternal Grandmother    Breast cancer Other     ADVANCED DIRECTIVES (Y/N):  N  HEALTH MAINTENANCE: Social History   Tobacco Use   Smoking status: Former    Packs/day: 0.50    Years: 20.00    Pack years: 10.00    Types: Cigarettes    Start date: 01/13/2002    Quit date: 10/17/2016    Years since quitting: 3.8   Smokeless tobacco: Never  Vaping Use   Vaping Use: Never used  Substance Use Topics   Alcohol use: Yes    Comment: Socially   Drug use: No     Colonoscopy:  PAP:  Bone density:  Lipid panel:  Allergies  Allergen Reactions   Betadine [Povidone-Iodine]     Current Outpatient Medications  Medication Sig Dispense Refill   ALPRAZolam (XANAX) 0.5 MG tablet Take 0.5 mg by mouth at bedtime as needed for anxiety. (Patient not taking: Reported on 08/08/2020)     amphetamine-dextroamphetamine (ADDERALL XR) 30 MG 24 hr capsule Take 30 mg by mouth daily.     amphetamine-dextroamphetamine (ADDERALL) 10 MG tablet Take 10 mg by mouth daily in the afternoon.     Ascorbic Acid (VITAMIN C) 500 MG CAPS Take 500 mg by mouth daily.     HYDROcodone-acetaminophen (NORCO/VICODIN) 5-325 MG tablet Take 1 tablet by mouth every 6 (six) hours as needed for moderate pain. (Patient not taking: Reported on 08/08/2020) 30 tablet 0   hydrOXYzine (ATARAX/VISTARIL) 10 MG tablet Take 10 mg by mouth at bedtime as needed for anxiety or itching (Sleep).     ibuprofen (ADVIL) 200 MG tablet Take 400 mg by mouth every 6 (six) hours as needed for mild pain.     letrozole (FEMARA) 2.5 MG tablet Take 1 tablet (2.5 mg total) by mouth daily. 90 tablet 3   Multiple Vitamin (MULTIVITAMIN WITH MINERALS) TABS tablet Take 1 tablet by  mouth daily.     naproxen sodium (ALEVE) 220 MG tablet Take 1 tablet (220 mg total) by mouth 3 (three) times daily with meals. 60 tablet 3   tacrolimus (PROTOPIC) 0.1 % ointment Apply 1 application topically daily. 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     Tetrahydrozoline HCl (VISINE OP) Place 1 drop into both eyes daily as needed (redness).     venlafaxine XR (EFFEXOR-XR) 75 MG 24 hr capsule Take 1 capsule (75 mg total) by mouth at bedtime.     vitamin B-12 (CYANOCOBALAMIN) 1000 MCG tablet Take 1,000 mcg by mouth daily.     No current facility-administered medications for this visit.    OBJECTIVE: There were no vitals filed for this visit.    There is no height or weight on file to calculate BMI.    ECOG FS:0 - Asymptomatic  General: Well-developed, well-nourished, no acute distress. Eyes: Pink conjunctiva, anicteric sclera. HEENT: Normocephalic, moist mucous membranes. Lungs: No audible wheezing or coughing. Heart: Regular rate   and rhythm. Abdomen: Soft, nontender, no obvious distention. Musculoskeletal: No edema, cyanosis, or clubbing. Neuro: Alert, answering all questions appropriately. Cranial nerves grossly intact. Skin: No rashes or petechiae noted. Psych: Normal affect. Lymphatics: No cervical, calvicular, axillary or inguinal LAD.   LAB RESULTS:  Lab Results  Component Value Date   NA 136 07/31/2020   K 4.3 07/31/2020   CL 102 07/31/2020   CO2 28 07/31/2020   GLUCOSE 102 (H) 07/31/2020   BUN 7 07/31/2020   CREATININE 0.54 07/31/2020   CALCIUM 8.7 (L) 07/31/2020   PROT 5.6 (L) 07/31/2020   ALBUMIN 2.6 (L) 07/31/2020   AST 24 07/31/2020   ALT 15 07/31/2020   ALKPHOS 41 07/31/2020   BILITOT 0.3 07/31/2020   GFRNONAA >60 07/31/2020   GFRAA >60 08/07/2019    Lab Results  Component Value Date   WBC 13.5 (H) 07/30/2020   NEUTROABS 4.7  04/02/2020   HGB 11.7 (L) 07/30/2020   HCT 34.9 (L) 07/30/2020   MCV 94.6 07/30/2020   PLT 290 07/30/2020     STUDIES: DG Chest 2 View  Result Date: 07/29/2020 CLINICAL DATA:  Preop for pleural effusion EXAM: CHEST - 2 VIEW COMPARISON:  07/22/2020 FINDINGS: Similar degree of moderate left pleural effusion recently evaluated by CT and PET. Normal heart size and mediastinal contours. Postoperative right axilla. IMPRESSION: Unchanged moderate left pleural effusion. Electronically Signed   By: Jonathon  Watts M.D.   On: 07/29/2020 06:30   DG Chest 2 View  Result Date: 07/22/2020 CLINICAL DATA:  Pleural effusion EXAM: CHEST - 2 VIEW COMPARISON:  07/19/2020, PET CT 07/18/2020 FINDINGS: Moderate left pleural effusion with probable loculation. Airspace disease at the left base as before. Normal cardiomediastinal silhouette. No pneumothorax. IMPRESSION: No significant change in moderate left pleural effusion with probable loculation. Electronically Signed   By: Kim  Fujinaga M.D.   On: 07/22/2020 15:40   NM PET Image Initial (PI) Skull Base To Thigh  Result Date: 07/19/2020 CLINICAL DATA:  Initial treatment strategy for loculated effusion and nodularity of the pleural surface in a patient with history of breast cancer. EXAM: NUCLEAR MEDICINE PET SKULL BASE TO THIGH TECHNIQUE: 8.8 mCi F-18 FDG was injected intravenously. Full-ring PET imaging was performed from the skull base to thigh after the radiotracer. CT data was obtained and used for attenuation correction and anatomic localization. Fasting blood glucose: 84 mg/dl COMPARISON:  Chest CT July 03, 2020. FINDINGS: Mediastinal blood pool activity: SUV max 1.97 Liver activity: SUV max NA NECK: No hypermetabolic lymph nodes in the neck. Incidental CT findings: none CHEST: Circumferential pleural nodularity associated with the loculated pleural effusion that was seen on previous chest imaging. This involves the entire circumference of the pleura and shows a  maximum SUV in the range of 9.5 posteriorly along the inferior chest and 13.1 anteriorly along the inferior chest measured on image 123 of series 3. Circumferential pleural thickening is more pronounced in the costodiaphragmatic sulci better demonstrated on the contrasted CT. Nodularity extends along the LEFT mediastinal border (image 86/3). Measures up to 7 mm greatest thickness and shows a maximum SUV in the range of 8.2 LEFT retropectoral lymph node (image 215/6, 76/3 with a maximum SUV of 2.6, this measures less than a cm and there are small retropectoral lymph nodes on the LEFT and a small LEFT supraclavicular lymph node as well on image 66/3 that measures 7 mm showing a maximum SUV of 2.2 Signs of RIGHT axillary dissection with a maximum SUV of 1.8 and   some soft tissue surrounding surgical clips in this area measuring 1.8 x 1.1 cm (image 85/3) RIGHT retrocrural lymph node (image 130/3) maximum SUV of 5.3 5 mm. No discrete mediastinal adenopathy. Some uptake about the LEFT hilum is contiguous with pleural thickening. 6 mm pulmonary nodule in the RIGHT upper lobe (image 89/3) this is not associated with increased FDG uptake. Well-circumscribed RIGHT lower lobe pulmonary nodule (image 110/3) 7 mm maximum SUV of 1.1 Fissural nodularity in the LEFT mid chest (image 93/3) 11 mm with an X min SUV of 5.6 Incidental CT findings: Normal caliber of the thoracic aorta. Normal heart size. Pericardial thickening and thickening along the LEFT mediastinal border. Is normal caliber of the central pulmonary vessels. Limited assessment of cardiovascular structures given lack of intravenous contrast. Loculated LEFT effusion of similar volume to previous imaging. ABDOMEN/PELVIS: Heterogeneous uptake in the liver without discrete CT correlate. More focal area of uptake in hepatic subsegment VIII is equivocal with a maximum SUV of 3.6 (image 157/6) this is of uncertain significance given the heterogeneity seen with respect to  hepatic uptake in general. No signs of visceral or nodal disease in the abdomen Incidental CT findings: No acute findings relative liver, gallbladder, spleen, pancreas, adrenal glands or kidneys. The urinary bladder is collapsed. No signs of bowel obstruction. Appendix is normal. IUD in place. No intra-abdominal ascites. SKELETON: No focal hypermetabolic activity to suggest skeletal metastasis. Incidental CT findings: none IMPRESSION: Signs of circumferential pleural disease most likely related to metastatic disease from breast cancer given patient history. This pattern can also be seen in the setting of mesothelioma and is associated with a loculated LEFT pleural effusion. Retrocrural lymph node with definite involvement. Nodules in the RIGHT chest without increased metabolic activity dominant nodule in the RIGHT lower lobe. Suspicious given other findings, attention on follow-up. Equivocal retropectoral and supraclavicular lymph nodes, attention on follow-up. Heterogeneous uptake pattern in the liver. Slightly more pronounced area in the posterior RIGHT hemi liver is equivocal. Could consider hepatic MRI for further assessment with and without contrast. Eovist contrast is often helpful in this setting. Electronically Signed   By: Geoffrey  Wile M.D.   On: 07/19/2020 15:37   DG CHEST PORT 1 VIEW  Result Date: 07/30/2020 CLINICAL DATA:  Chest tube. EXAM: PORTABLE CHEST 1 VIEW COMPARISON:  07/29/2020. FINDINGS: Left chest tubes in stable position. No pneumothorax. Stable mild left pleural thickening/pleural effusion. Low lung volumes with persistent bibasilar atelectasis. Stable cardiomegaly. No acute bony abnormality. IMPRESSION: 1. Left chest tube in stable position. No pneumothorax. Stable mild left pleural thickening/pleural effusion. 2.  Low lung volumes with persistent bibasilar atelectasis. 3.  Stable cardiomegaly. Electronically Signed   By: Thomas  Register   On: 07/30/2020 07:38   DG Chest Port 1  View  Result Date: 07/29/2020 CLINICAL DATA:  Status post pleural drainage catheter placement. EXAM: PORTABLE CHEST 1 VIEW COMPARISON:  Same day. FINDINGS: Stable cardiomegaly. Interval placement of left-sided chest tube. Left pleural effusion is significantly smaller. Right lung is clear. IMPRESSION: Left pleural effusion is significantly smaller status post left-sided chest tube placement. Electronically Signed   By: James  Green Jr M.D.   On: 07/29/2020 11:55   DG Chest Port 1 View  Result Date: 07/19/2020 CLINICAL DATA:  Post left-sided thoracentesis. EXAM: PORTABLE CHEST 1 VIEW COMPARISON:  07/01/2020; chest CT-07/03/2020; PET-CT-07/18/2020 FINDINGS: Grossly unchanged cardiac silhouette and mediastinal contours. Interval reduction in persistent moderate-sized partially loculated left-sided effusion post thoracentesis. No pneumothorax. Residual left basilar heterogeneous/consolidative opacities, unchanged. The right   hemithorax remains well aerated. No new focal airspace opacities. No evidence of edema. No acute osseous abnormalities. Surgical clips overlie the upper outer quadrant of the right breast. IMPRESSION: 1. Slight reduction in persistent moderate sized partially loculated left-sided effusion post thoracentesis. No pneumothorax. 2. Unchanged left basilar heterogeneous/consolidative opacities, likely atelectasis. Electronically Signed   By: John  Watts M.D.   On: 07/19/2020 15:20   US THORACENTESIS ASP PLEURAL SPACE W/IMG GUIDE  Result Date: 07/19/2020 INDICATION: Recurrent symptomatic left-sided pleural effusion. Please perform ultrasound-guided thoracentesis for diagnostic and therapeutic purposes. EXAM: US THORACENTESIS ASP PLEURAL SPACE W/IMG GUIDE COMPARISON:  PET-CT-07/18/2020; chest CT-07/03/2020; ultrasound-guided left-sided thoracentesis-07/03/2020 (yielding 700 cc of pleural fluid). MEDICATIONS: None. COMPLICATIONS: None immediate. TECHNIQUE: Informed written consent was obtained from  the patient after a discussion of the risks, benefits and alternatives to treatment. A timeout was performed prior to the initiation of the procedure. Initial ultrasound scanning demonstrates a moderate sized recurrent left-sided pleural effusion the basilar component of which is noted to be extensively loculated. An adequate percutaneous window was identified at the level of the left mid lateral chest and the procedure was planned. The chest was prepped and draped in the usual sterile fashion. 1% lidocaine was used for local anesthesia. An ultrasound image was saved for documentation purposes. An 8 Fr Safe-T-Centesis catheter was introduced. The thoracentesis was performed. The catheter was removed and a dressing was applied. The patient tolerated the procedure well without immediate post procedural complication. The patient was escorted to have an upright chest radiograph. FINDINGS: A total of approximately 275 cc of serous fluid was removed. Requested samples were sent to the laboratory. IMPRESSION: Successful ultrasound-guided left sided thoracentesis yielding 275 cc of pleural fluid. Note, an incomplete thoracentesis was performed secondary to extensive loculations within the pleural fluid. Electronically Signed   By: John  Watts M.D.   On: 07/19/2020 15:35    ONCOLOGY HISTORY: Patient initially self palpated a lump in the 12 o'clock position of her right breast and subsequently underwent biopsy on September 26, 2018 confirming malignancy.  Initial MammaPrint was reported high risk.  She underwent right lumpectomy on November 21, 2018 which revealed a T2, N1, M0 stage IIa malignancy.  1 of 4 lymph nodes were positive for disease.  She subsequently underwent adjuvant chemotherapy with AC/Taxol completing treatment on Jun 01, 2019.  She then completed adjuvant XRT on August 11, 2019 and was started on tamoxifen.  ASSESSMENT: Recurrent, stage IV ER/PR positive, HER2 negative invasive carcinoma of the breast  with metastasis to the lung pleura.  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  She has noted to have retrocrural lymph node with definite involvement and suspicious retroperitoneal and supraclavicular nodes.  She had equivocal hypermetabolism in her liver.  Patient also underwent second opinion at [facility] in [place].  Agree with recommendation to proceed with Lupron 3.75 mg every 28 days for up to 24 months for ovarian suppression along with an aromatase inhibitor and Ibrance 125 mg for 21 days with 7 days off.  She has been instructed to discontinue tamoxifen.  Can also consider capecitabine or [place] in the future if treatment is not tolerated or patient has progression of disease.  Will get an MRI brain to complete the staging work-up.  Return to clinic   in 1 week to initiate treatment. Shortness of breath: Patient continues to have Pleurx catheter in place, but is draining minimal fluid.  She has a virtual visit with thoracic surgery tomorrow.  I spent a total of 60 minutes reviewing chart data, face-to-face evaluation with the patient, counseling and coordination of care as detailed above.   Patient expressed understanding and was in agreement with this plan. She also understands that She can call clinic at any time with any questions, concerns, or complaints.   Cancer Staging Malignant neoplasm of upper-outer quadrant of right breast in female, estrogen receptor positive (HCC) Staging form: Breast, AJCC 8th Edition - Clinical stage from 10/05/2018: Stage IB (cT2, cN0, cM0, G2, ER+, PR+, HER2-) - Signed by Magrinat, Gustav C, MD on 04/02/2020 Stage prefix: Initial diagnosis Histologic grading system: 3 grade system - Pathologic stage from 08/09/2020:  No Stage Recommended (ypT2, pN1a, pM1, G2, ER+, PR+, HER2-) - Signed by ,  J, MD on 08/09/2020 Stage prefix: Post-therapy Multigene prognostic tests performed: MammaPrint Histologic grading system: 3 grade system    J , MD   08/09/2020 9:34 AM     

## 2020-08-09 NOTE — ED Triage Notes (Signed)
Pt states that on 07/29/20 she had a pleural chest tube placed and now she has a fever and her provider has sent her hear for a CT scan and the tube to be removed. Pt states she does have a history of cancer. Area of tube noted to be red. Pt states she had a fever of 100.7 at home, PTA and that she took medicine.

## 2020-08-09 NOTE — Telephone Encounter (Signed)
Spoke with the pt's Brother, Cristie Hem  He states last night and this am pt having "extreme pain" around surgery site  Area is red and blistered, pt without any fever or SOB  Pt is scheduled for video visit with Dr Kipp Brood today, but asking if she can come here and see Dr Patsey Berthold so she does not have to drive to [facility]  Per Dr Darnell Level- pt should follow with Dr Kipp Brood  I made the pt and Alex aware  Nothing further needed

## 2020-08-09 NOTE — Progress Notes (Signed)
Ms. Seepersad and her brother contacted the office with c/o left sided chest pain along with redness along Pleurx Catheter. Per patient, she has been feeling well up until yesterday when her surgical incision became increasingly painful. Per patient, she spoke with our on call physician, Dr. Roxan Hockey, who recommended she take her prescribed hydrocodone every 4 hours instead of every 6. This is slightly helped with her pain. Patient also states there is redness tracking along her pleurx catheter as well as around its insertion site. Patient denies warmth or fever. Patient sent photos to office cell phone which demonstrates the redness. Discussed with Dr. Kipp Brood. Prescription for Keflex sent into patient's preferred pharmacy of [facility]. Patient aware. She has a virtual appt with Dr. Kipp Brood this afternoon that she is aware of.

## 2020-08-09 NOTE — ED Notes (Signed)
EMTALA reviewed by this RN.  

## 2020-08-09 NOTE — Telephone Encounter (Signed)
Oral Oncology Patient Advocate Encounter   Received notification from [facility] that prior authorization for Kaylee Romero is required.   PA submitted on CoverMyMeds Key [id number]  Status is pending   Oral Oncology Clinic will continue to follow.  [facility] Phone [phone] Fax [phone] 08/09/2020 9:59 AM

## 2020-08-09 NOTE — Telephone Encounter (Signed)
Records faxed to [facility] breast cancer clinic at [phone]/ATT: Franchot Erichsen.

## 2020-08-09 NOTE — Sepsis Progress Note (Signed)
Following for sepsis monitoring ?

## 2020-08-09 NOTE — Telephone Encounter (Signed)
[name] called answeing service and want a call back at [phone]. I called three times and not able to reach her.  Chart was reviewed. Patient is currently in ER, awaiting transferring to [facility].

## 2020-08-10 ENCOUNTER — Encounter (HOSPITAL_COMMUNITY): Payer: Self-pay | Admitting: Internal Medicine

## 2020-08-10 DIAGNOSIS — F988 Other specified behavioral and emotional disorders with onset usually occurring in childhood and adolescence: Secondary | ICD-10-CM

## 2020-08-10 DIAGNOSIS — Z170 Estrogen receptor positive status [ER+]: Secondary | ICD-10-CM

## 2020-08-10 DIAGNOSIS — A419 Sepsis, unspecified organism: Secondary | ICD-10-CM

## 2020-08-10 DIAGNOSIS — C50411 Malignant neoplasm of upper-outer quadrant of right female breast: Secondary | ICD-10-CM

## 2020-08-10 DIAGNOSIS — J90 Pleural effusion, not elsewhere classified: Secondary | ICD-10-CM

## 2020-08-10 LAB — COMPREHENSIVE METABOLIC PANEL
ALT: 11 U/L (ref 0–44)
AST: 15 U/L (ref 15–41)
Albumin: 2.5 g/dL — ABNORMAL LOW (ref 3.5–5.0)
Alkaline Phosphatase: 49 U/L (ref 38–126)
Anion gap: 8 (ref 5–15)
BUN: 6 mg/dL (ref 6–20)
CO2: 26 mmol/L (ref 22–32)
Calcium: 8.8 mg/dL — ABNORMAL LOW (ref 8.9–10.3)
Chloride: 103 mmol/L (ref 98–111)
Creatinine, Ser: 0.56 mg/dL (ref 0.44–1.00)
GFR, Estimated: >60 mL/min (ref >60)
Glucose, Bld: 149 mg/dL — ABNORMAL HIGH (ref 70–99)
Potassium: 3.7 mmol/L (ref 3.5–5.1)
Sodium: 137 mmol/L (ref 135–145)
Total Bilirubin: 0.3 mg/dL (ref 0.3–1.2)
Total Protein: 6.2 g/dL — ABNORMAL LOW (ref 6.5–8.1)

## 2020-08-10 LAB — HIV ANTIBODY (ROUTINE TESTING W REFLEX): HIV Screen 4th Generation wRfx: NONREACTIVE

## 2020-08-10 LAB — CBC
HCT: 31.3 % — ABNORMAL LOW (ref 36.0–46.0)
Hemoglobin: 10.5 g/dL — ABNORMAL LOW (ref 12.0–15.0)
MCH: 30.8 pg (ref 26.0–34.0)
MCHC: 33.5 g/dL (ref 30.0–36.0)
MCV: 91.8 fL (ref 80.0–100.0)
Platelets: 419 10*3/uL — ABNORMAL HIGH (ref 150–400)
RBC: 3.41 MIL/uL — ABNORMAL LOW (ref 3.87–5.11)
RDW: 12.2 % (ref 11.5–15.5)
WBC: 24.5 10*3/uL — ABNORMAL HIGH (ref 4.0–10.5)
nRBC: 0 % (ref 0.0–0.2)

## 2020-08-10 LAB — CORTISOL-AM, BLOOD: Cortisol - AM: 13.1 ug/dL (ref 6.7–22.6)

## 2020-08-10 LAB — PROCALCITONIN: Procalcitonin: 0.24 ng/mL

## 2020-08-10 LAB — PROTIME-INR
INR: 1.1 (ref 0.8–1.2)
Prothrombin Time: 14.5 seconds (ref 11.4–15.2)

## 2020-08-10 LAB — LACTIC ACID, PLASMA
Lactic Acid, Venous: 1.8 mmol/L (ref 0.5–1.9)
Lactic Acid, Venous: 1 mmol/L (ref 0.5–1.9)

## 2020-08-10 MED ORDER — BISACODYL 5 MG PO TBEC
5.0000 mg | DELAYED_RELEASE_TABLET | Freq: Every day | ORAL | Status: DC | PRN
  Administered 2020-08-10 – 2020-08-11 (×3): 5 mg via ORAL
  Filled 2020-08-10 (×3): qty 1

## 2020-08-10 MED ORDER — ONDANSETRON HCL 4 MG PO TABS: 4.0000 mg | ORAL_TABLET | Freq: Four times a day (QID) | ORAL | Status: DC | PRN

## 2020-08-10 MED ORDER — VANCOMYCIN HCL 750 MG/150ML IV SOLN
750.0000 mg | Freq: Two times a day (BID) | INTRAVENOUS | Status: AC
Start: 2020-08-11 — End: 2020-08-11
  Administered 2020-08-11 (×2): 750 mg via INTRAVENOUS
  Filled 2020-08-10 (×2): qty 150

## 2020-08-10 MED ORDER — AMPHETAMINE-DEXTROAMPHET ER 30 MG PO CP24: 30.0000 mg | ORAL_CAPSULE | Freq: Every day | ORAL | Status: DC

## 2020-08-10 MED ORDER — LACTATED RINGERS IV BOLUS
1000.0000 mL | Freq: Once | INTRAVENOUS | Status: AC
  Administered 2020-08-10: 1000 mL via INTRAVENOUS

## 2020-08-10 MED ORDER — VENLAFAXINE HCL ER 75 MG PO CP24
75.0000 mg | ORAL_CAPSULE | Freq: Every day | ORAL | Status: DC
  Administered 2020-08-10 – 2020-08-14 (×6): 75 mg via ORAL
  Filled 2020-08-10 (×6): qty 1

## 2020-08-10 MED ORDER — ONDANSETRON HCL 4 MG/2ML IJ SOLN: 4.0000 mg | Freq: Four times a day (QID) | INTRAMUSCULAR | Status: DC | PRN

## 2020-08-10 MED ORDER — ALPRAZOLAM 0.5 MG PO TABS
0.5000 mg | ORAL_TABLET | Freq: Three times a day (TID) | ORAL | Status: DC | PRN
  Administered 2020-08-10 – 2020-08-15 (×11): 0.5 mg via ORAL
  Filled 2020-08-10 (×11): qty 1

## 2020-08-10 MED ORDER — SODIUM CHLORIDE 0.9% FLUSH
10.0000 mL | Freq: Two times a day (BID) | INTRAVENOUS | Status: DC
  Administered 2020-08-10 – 2020-08-14 (×9): 10 mL

## 2020-08-10 MED ORDER — SODIUM CHLORIDE 0.9% FLUSH: 10.0000 mL | INTRAVENOUS | Status: DC | PRN

## 2020-08-10 MED ORDER — IBUPROFEN 400 MG PO TABS
400.0000 mg | ORAL_TABLET | Freq: Four times a day (QID) | ORAL | Status: DC | PRN
  Administered 2020-08-10 – 2020-08-12 (×5): 400 mg via ORAL
  Filled 2020-08-10 (×6): qty 1

## 2020-08-10 MED ORDER — ACETAMINOPHEN 325 MG PO TABS
650.0000 mg | ORAL_TABLET | Freq: Four times a day (QID) | ORAL | Status: DC | PRN
  Administered 2020-08-10 – 2020-08-15 (×8): 650 mg via ORAL
  Filled 2020-08-10 (×8): qty 2

## 2020-08-10 MED ORDER — ADULT MULTIVITAMIN W/MINERALS CH
1.0000 | ORAL_TABLET | Freq: Every day | ORAL | Status: DC
  Administered 2020-08-11 – 2020-08-15 (×5): 1 via ORAL
  Filled 2020-08-10 (×5): qty 1

## 2020-08-10 MED ORDER — SODIUM CHLORIDE 0.9 % IV SOLN
2.0000 g | INTRAVENOUS | Status: DC
  Administered 2020-08-10 – 2020-08-14 (×5): 2 g via INTRAVENOUS
  Filled 2020-08-10: qty 2
  Filled 2020-08-10: qty 20
  Filled 2020-08-10 (×2): qty 2
  Filled 2020-08-10: qty 20
  Filled 2020-08-10: qty 2

## 2020-08-10 MED ORDER — HYDROXYZINE HCL 10 MG PO TABS
10.0000 mg | ORAL_TABLET | Freq: Every evening | ORAL | Status: DC | PRN
  Administered 2020-08-10 – 2020-08-13 (×5): 10 mg via ORAL
  Filled 2020-08-10 (×6): qty 1

## 2020-08-10 MED ORDER — AMPHETAMINE-DEXTROAMPHETAMINE 10 MG PO TABS: 10.0000 mg | ORAL_TABLET | Freq: Every day | ORAL | Status: DC

## 2020-08-10 MED ORDER — LIDOCAINE HCL (PF) 1 % IJ SOLN
INTRAMUSCULAR | Status: AC
  Filled 2020-08-10: qty 10

## 2020-08-10 MED ORDER — OXYCODONE HCL 5 MG PO TABS
5.0000 mg | ORAL_TABLET | ORAL | Status: DC | PRN
  Administered 2020-08-10 – 2020-08-14 (×15): 5 mg via ORAL
  Filled 2020-08-10 (×17): qty 1

## 2020-08-10 MED ORDER — VITAMIN B-12 1000 MCG PO TABS
1000.0000 ug | ORAL_TABLET | Freq: Every day | ORAL | Status: DC
  Administered 2020-08-11 – 2020-08-14 (×4): 1000 ug via ORAL
  Filled 2020-08-10 (×5): qty 1

## 2020-08-10 MED ORDER — VANCOMYCIN HCL 1750 MG/350ML IV SOLN
1750.0000 mg | Freq: Two times a day (BID) | INTRAVENOUS | Status: DC
  Administered 2020-08-10 (×2): 1750 mg via INTRAVENOUS
  Filled 2020-08-10 (×2): qty 350

## 2020-08-10 MED ORDER — LACTATED RINGERS IV SOLN: INTRAVENOUS | Status: DC

## 2020-08-10 MED ORDER — HYDROMORPHONE HCL 1 MG/ML IJ SOLN
0.5000 mg | INTRAMUSCULAR | Status: DC | PRN
  Administered 2020-08-10 (×3): 1 mg via INTRAVENOUS
  Administered 2020-08-10: 0.5 mg via INTRAVENOUS
  Administered 2020-08-10 – 2020-08-15 (×28): 1 mg via INTRAVENOUS
  Filled 2020-08-10 (×35): qty 1

## 2020-08-10 MED ORDER — SODIUM CHLORIDE 0.9 % IV SOLN: 2.0000 g | INTRAVENOUS | Status: DC

## 2020-08-10 MED ORDER — ACETAMINOPHEN 650 MG RE SUPP: 650.0000 mg | Freq: Four times a day (QID) | RECTAL | Status: DC | PRN

## 2020-08-10 NOTE — Progress Notes (Signed)
      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  Indications: Kaylee Romero is well-known to our service having undergone left VATS with pleural biopsy and placement of a Pleurx catheter by Dr. Kipp Brood about 2 weeks ago.  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  The Pleurx catheter that was placed for management of recurrent pleural effusion has had minimal drainage over the last week.  In addition, Kaylee Romero has developed tenderness and erythema along the course of the Pleurx catheters tunnel on Kaylee Romero left lateral chest wall.  Kaylee Romero presented to the emergency room at [facility] last evening with fever, weakness, malaise, along with the tenderness and erythema along the course of the Pleurx catheter.  Our service was advised of Kaylee Romero admission and removal of the Pleurx catheter was recommended.    Description of procedure: This bedside procedure was described in this Kaylee Romero and Kaylee Romero questions were answered.  Kaylee Romero consented to proceed.  The skin surrounding the Pleurx catheter exit site on the left anterolateral chest wall was prepped with a chlorhexidine sponge.  Then, using sterile technique, the site was anesthetized with 1% lidocaine.  Suture was removed.  Then, using gentle traction,  the catheter cuff released easily and the Pleurx catheter was removed without any resistance.  There was purulent material on the outside of the tube as well as inside the lumen.  For this reason, the tunnel was expressed with a rolled gauze from posterior to anterior and approximately 5 cc of thick light green purulent material was expressed.  A sample was obtained and sent for culture.  The tube exit site was then carefully packed  with about 2 cm of cotton gauze and then covered with a dry 4 x 4 dressing.  Kaylee Romero tolerated this well.  Macarthur Critchley, PA-C

## 2020-08-10 NOTE — Progress Notes (Addendum)
Pharmacy Antibiotic Note  Kaylee Romero is a 40 y.o. female admitted on 08/09/2020 with  sepsis secondary to infected pleurx catheter, cellulitis of chest wall, and empyema .  Pharmacy has been consulted for vancomycin dosing.  Vancomycin 1250 mg was started at [facility]. After transfer to [facility], patient was started on vancomycin 1750 mg q12h. This has been empirically decreased to vancomycin '750mg'$  q12h based on patient's total body weight 79.5 kg and BMI >30. Peak ordered for 8/6 '@1700'$  and trough ordered for 8/7 '@0100'$  to assess for suspected elevated levels based on previous dose.   8/7 Vancomycin 750 mg Q 12 hr Scr used: 0.8 mg/dL Weight: 79.5 kg Vd coeff: 0.5 L/kg Est AUC: 456  Plan: Empirically decrease to vancomycin 750 mg q12h Follow-up levels ordered for 8/7 '@0100'$  Continue ceftriaxone 2 g q24h Follow-up cultures Monitor renal function  Height: '5\' 4"'$  (162.6 cm) Weight: 79.5 kg (175 lb 3.2 oz) IBW/kg (Calculated) : 54.7  Temp (24hrs), Avg:99.2 F (37.3 C), Min:98.3 F (36.8 C), Max:100.6 F (38.1 C)  Recent Labs  Lab 08/09/20 1748 08/10/20 0130 08/10/20 0445  WBC 27.2* 24.5*  --   CREATININE 0.63 0.56  --   LATICACIDVEN 2.5* 1.8 1.0    Estimated Creatinine Clearance: 94.4 mL/min (by C-G formula based on SCr of 0.56 mg/dL).    Allergies  Allergen Reactions   Betadine [Povidone-Iodine]     Antimicrobials this admission: Ceftriaxone 8/5>> Vanc 8/5>>  Microbiology results: 8/6 Bcx: in process 8/6 pleural fluid cx: ordered  Thank you for allowing pharmacy to be a part of this patient's care.  Zenaida Deed, PharmD PGY1 Acute Care Pharmacy Resident  Phone: [phone] 08/10/2020  2:54 PM  Please check [email or web address] for unit-specific pharmacy phone numbers.

## 2020-08-10 NOTE — Progress Notes (Signed)
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 Kipp Brood on 7/25 who initially presented to [facility] ED on 8/5 with subsequent transfer to [facility] hospital for fever, weakness/malaise, increased pain and redness at Pleurx catheter site.  Patient reports minimal output from Pleurx catheter.  In the ED,Temperature 100.6 F, HR 117, RR 21, BP 104/55, SPO2 98% on room air.  Sodium 132, potassium 3.3, chloride 100, CO2 23, glucose 226, BUN 9, creatinine 0.63.  Lactic acid 2.5.  WBC 27.2, hemoglobin 11.9, platelets 479.  COVID-19 PCR negative.  Influenza A/B PCR negative.  Urinalysis unrevealing.  Chest x-ray with slightly improved aeration left base compared to prior radiograph; no significant change left pleural effusion/disease.  CT chest with contrast with Pleurx catheter noted in place on left with loculated left hydropneumothorax, continued consolidation lingula and left lower lobe concerning for pneumonia, stranding subcutaneous soft tissues lateral left lower chest wall and upper abdominal wall/flank compatible with cellulitis.  Blood cultures x2 obtained.  Patient was started on empiric antibiotics with vancomycin and ceftriaxone.  Transferred from [facility] for CTS evaluation.  TRH consulted for admission.   Assessment & Plan:   Principal Problem:   Sepsis ([place]) Active Problems:   Malignant neoplasm of upper-outer quadrant of right breast in female, estrogen receptor positive (HCC)   Pleural effusion on left   Severe sepsis, POA Loculated hydropneumothorax concerning  for empyema, POA Left chest wall cellulitis, POA Lactic acidosis: Resolved Patient presenting as a transfer from [facility] with fever, weakness/malaise, shortness of breath.  CTchest with contrast with Pleurx catheter noted in place on left with loculated left hydropneumothorax, continued consolidation lingula and left lower lobe concerning for pneumonia, stranding subcutaneous soft tissues lateral left lower chest wall and upper abdominal wall/flank compatible with cellulitis.  Recent VATS/Pleurx placement by CTS Dr. Kipp Brood on 7/25.  Patient reports minimal drainage from catheter since placement.  Patient with fever 100.6, WBC count 27.2, lactic acid 2.5, and procalcitonin 0.24 on admission. --CTS, Dr. 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 Grayland Ormond at [facility].  Recent VATS/Pleurx catheter placement by CTS, Dr. Kipp Brood on 7/25.  Initially diagnosed in 2020 and underwent chemotherapy, radiation and lumpectomy now with recurrence.  Planning to start Lupron, aromatase inhibitor and Ibrance. --Continue outpatient follow-up with oncology  Depression: Continue Effexor  ADD: Continue Adderall  DVT prophylaxis: SCDs Start: 08/10/20 0009   Code Status: Full Code Family Communication: Updated patient's mother and brother who is present at bedside this morning  Disposition Plan:  Level of care: Progressive Status is: Inpatient  Remains inpatient appropriate because:Ongoing diagnostic testing needed not appropriate for outpatient work up, Unsafe d/c plan, IV treatments  appropriate due to intensity of illness or inability to take PO, and Inpatient level of  care appropriate due to severity of illness  Dispo: The patient is from: Home              Anticipated d/c is to: Home              Patient currently is not medically stable to d/c.   Difficult to place patient No  Consultants:  CTS, Dr. Kipp Brood  Procedures:  None  Antimicrobials:  Vancomycin 8/6>> Ceftriaxone 8/6>>   Subjective: Patient seen examined at bedside, resting comfortably.  Mother and brother present.  Continues with weakness/malaise and generalized ill feeling.  Pain to Pleurx catheter site with associated redness.  Awaiting CTS evaluation.  Family concerned about "sepsis".  Patient states minimal to no drainage since insertion of Pleurx catheter.  No other questions or concerns at this time.  Denies headache, no visual changes, no chest pain, no palpitations, no abdominal pain, no cough/congestion.  No acute concerns overnight per nursing staff.  Objective: Vitals:   08/10/20 0133 08/10/20 0217 08/10/20 0427 08/10/20 0756  BP: (!) 106/56 125/69 121/66 139/66  Pulse: (!) 116 (!) 101 92 (!) 116  Resp: $Remo'20 19 17 20  'ynxSS$ Temp: 99.7 F (37.6 C) 98.8 F (37.1 C) 98.3 F (36.8 C) 100.3 F (37.9 C)  TempSrc: Oral Oral Oral Oral  SpO2: 96% 99% 100% 98%  Weight:      Height:        Intake/Output Summary (Last 24 hours) at 08/10/2020 1134 Last data filed at 08/10/2020 0416 Gross per 24 hour  Intake 608.82 ml  Output --  Net 608.82 ml   Filed Weights   08/10/20 0002  Weight: 79.5 kg    Examination:  General exam: Appears calm and comfortable, chronically ill in appearance Respiratory system: Clear to auscultation. Respiratory effort normal.  On room air Cardiovascular system: S1 & S2 heard, RRR. No JVD, murmurs, rubs, gallops or clicks. No pedal edema.  Left chest wall with edema/erythema surrounding Pleurx catheter site with tenderness to palpation Gastrointestinal system: Abdomen  is nondistended, soft and nontender. No organomegaly or masses felt. Normal bowel sounds heard. Central nervous system: Alert and oriented. No focal neurological deficits. Extremities: Symmetric 5 x 5 power. Skin: No rashes, lesions or ulcers Psychiatry: Judgement and insight appear normal.  Depressed mood & flat affect.     Data Reviewed: I have personally reviewed following labs and imaging studies  CBC: Recent Labs  Lab 08/09/20 1748 08/10/20 0130  WBC 27.2* 24.5*  NEUTROABS 25.0*  --   HGB 11.9* 10.5*  HCT 35.1* 31.3*  MCV 92.9 91.8  PLT 479* 223*   Basic Metabolic Panel: Recent Labs  Lab 08/09/20 1748 08/10/20 0130  NA 132* 137  K 3.3* 3.7  CL 100 103  CO2 23 26  GLUCOSE 226* 149*  BUN 9 6  CREATININE 0.63 0.56  CALCIUM 9.0 8.8*   GFR: Estimated Creatinine Clearance: 94.4 mL/min (by C-G formula based on SCr of 0.56 mg/dL). Liver Function Tests: Recent Labs  Lab 08/09/20 1748 08/10/20 0130  AST 27 15  ALT 14 11  ALKPHOS 54 49  BILITOT 0.6 0.3  PROT 7.4 6.2*  ALBUMIN 3.4* 2.5*   No results for input(s): LIPASE, AMYLASE in the last 168 hours. No results for input(s): AMMONIA in the last 168 hours. Coagulation Profile: Recent Labs  Lab 08/10/20 0130  INR 1.1   Cardiac Enzymes: No results for input(s): CKTOTAL, CKMB, CKMBINDEX, TROPONINI in the last 168 hours. BNP (last 3 results) No results for  input(s): PROBNP in the last 8760 hours. HbA1C: No results for input(s): HGBA1C in the last 72 hours. CBG: No results for input(s): GLUCAP in the last 168 hours. Lipid Profile: No results for input(s): CHOL, HDL, LDLCALC, TRIG, CHOLHDL, LDLDIRECT in the last 72 hours. Thyroid Function Tests: No results for input(s): TSH, T4TOTAL, FREET4, T3FREE, THYROIDAB in the last 72 hours. Anemia Panel: No results for input(s): VITAMINB12, FOLATE, FERRITIN, TIBC, IRON, RETICCTPCT in the last 72 hours. Sepsis Labs: Recent Labs  Lab 08/09/20 1748 08/10/20 0130  08/10/20 0445  PROCALCITON  --  0.24  --   LATICACIDVEN 2.5* 1.8 1.0    Recent Results (from the past 240 hour(s))  Resp Panel by RT-PCR (Flu A&B, Covid) Nasopharyngeal Swab     Status: None   Collection Time: 08/09/20  6:37 PM   Specimen: Nasopharyngeal Swab; Nasopharyngeal(NP) swabs in vial transport medium  Result Value Ref Range Status   SARS Coronavirus 2 by RT PCR NEGATIVE NEGATIVE Final    Comment: (NOTE) SARS-CoV-2 target nucleic acids are NOT DETECTED.  The SARS-CoV-2 RNA is generally detectable in upper respiratory specimens during the acute phase of infection. The lowest concentration of SARS-CoV-2 viral copies this assay can detect is 138 copies/mL. A negative result does not preclude SARS-Cov-2 infection and should not be used as the sole basis for treatment or other patient management decisions. A negative result may occur with  improper specimen collection/handling, submission of specimen other than nasopharyngeal swab, presence of viral mutation(s) within the areas targeted by this assay, and inadequate number of viral copies(<138 copies/mL). A negative result must be combined with clinical observations, patient history, and epidemiological information. The expected result is Negative.  Fact Sheet for Patients:  EntrepreneurPulse.com.au  Fact Sheet for Healthcare Providers:  IncredibleEmployment.be  This test is no t yet approved or cleared by the [place] FDA and  has been authorized for detection and/or diagnosis of SARS-CoV-2 by FDA under an Emergency Use Authorization (EUA). This EUA will remain  in effect (meaning this test can be used) for the duration of the COVID-19 declaration under Section 564(b)(1) of the Act, 21 U.S.C.section 360bbb-3(b)(1), unless the authorization is terminated  or revoked sooner.       Influenza A by PCR NEGATIVE NEGATIVE Final   Influenza B by PCR NEGATIVE NEGATIVE Final    Comment:  (NOTE) The Xpert Xpress SARS-CoV-2/FLU/RSV plus assay is intended as an aid in the diagnosis of influenza from Nasopharyngeal swab specimens and should not be used as a sole basis for treatment. Nasal washings and aspirates are unacceptable for Xpert Xpress SARS-CoV-2/FLU/RSV testing.  Fact Sheet for Patients: EntrepreneurPulse.com.au  Fact Sheet for Healthcare Providers: IncredibleEmployment.be  This test is not yet approved or cleared by the [place] FDA and has been authorized for detection and/or diagnosis of SARS-CoV-2 by FDA under an Emergency Use Authorization (EUA). This EUA will remain in effect (meaning this test can be used) for the duration of the COVID-19 declaration under Section 564(b)(1) of the Act, 21 U.S.C. section 360bbb-3(b)(1), unless the authorization is terminated or revoked.  Performed at [facility], [address], [place], [place] [address]          Radiology Studies: DG Chest 2 View  Result Date: 08/09/2020 CLINICAL DATA:  Chest tube fever EXAM: CHEST - 2 VIEW COMPARISON:  07/30/2020, CT 07/03/2020 FINDINGS: Left lower chest drainage catheter slightly retracted with removal of previously noted additional pleural drainage catheter. Right lung grossly clear. Slightly improved aeration  left base with residual left pleural disease and airspace disease at left base. Normal cardiac size. IMPRESSION: Slightly improved aeration at left base compared to prior radiograph. Otherwise no significant change in residual left pleural effusion/disease and mild airspace disease at left base Electronically Signed   By: Donavan Foil M.D.   On: 08/09/2020 18:25   CT Chest W Contrast  Result Date: 08/09/2020 CLINICAL DATA:  Sepsis. Left PleurX in place. Signs of cellulitis at access site. EXAM: CT CHEST WITH CONTRAST TECHNIQUE: Multidetector CT imaging of the chest was performed during intravenous contrast administration.  CONTRAST:  61mL OMNIPAQUE IOHEXOL 350 MG/ML SOLN COMPARISON:  07/03/2020 FINDINGS: Cardiovascular: Heart is normal size. Aorta is normal caliber. Mediastinum/Nodes: No mediastinal, hilar, or axillary adenopathy. Trachea and esophagus are unremarkable. Thyroid unremarkable. Lungs/Pleura: Left PleurX catheter is in place in the left lower hemithorax. Loculated left hydropneumothorax present. Overall, amount of fluid has decreased since prior study. Airspace disease throughout the lingula and left lower lobe, similar to prior study. Right lung clear. Upper Abdomen: No acute findings Musculoskeletal: There is stranding in the left lateral subcutaneous soft tissues in the lower left chest and upper abdomen/flank region which could reflect cellulitis. No focal fluid collections. No acute bony abnormality. IMPRESSION: PleurX catheter in place on the left with loculated left hydropneumothorax. Overall, the fusion is smaller in size when compared to prior study. Continued areas of consolidation in the lingula and left lower lobe concerning for pneumonia, unchanged. Stranding in the subcutaneous soft tissues in the lateral left lower chest wall and upper abdominal wall/flank compatible with cellulitis. Electronically Signed   By: Rolm Baptise M.D.   On: 08/09/2020 19:24        Scheduled Meds:  multivitamin with minerals  1 tablet Oral Daily   sodium chloride flush  10-40 mL Intracatheter Q12H   venlafaxine XR  75 mg Oral QHS   vitamin B-12  1,000 mcg Oral Daily   Continuous Infusions:  [START ON 08/11/2020] cefTRIAXone (ROCEPHIN)  IV     lactated ringers 125 mL/hr at 08/10/20 0416   vancomycin Stopped (08/10/20 0411)     LOS: 1 day    Time spent: 45 minutes spent on chart review, discussion with nursing staff, consultants, updating family and interview/physical exam; more than 50% of that time was spent in counseling and/or coordination of care.    Josel Keo J [place] DO [facility] Available via  Epic secure chat 7am-7pm After these hours, please refer to coverage provider listed on [email or web address] 08/10/2020, 11:34 AM

## 2020-08-10 NOTE — Progress Notes (Signed)
Pharmacy Antibiotic Note  Kaylee Romero is a 40 y.o. female admitted on 08/09/2020 with  empyema .  Pharmacy has been consulted for vancomycin dosing. Vancomycin 1250 mg given earlier at [place]: Cont vancomycin 1750 mg IV q12 hours F/u renal function, cultures and clinical course     Temp (24hrs), Avg:99.3 F (37.4 C), Min:98.5 F (36.9 C), Max:100.6 F (38.1 C)  Recent Labs  Lab 08/09/20 1748  WBC 27.2*  CREATININE 0.63  LATICACIDVEN 2.5*    Estimated Creatinine Clearance: 93.1 mL/min (by C-G formula based on SCr of 0.63 mg/dL).    Allergies  Allergen Reactions   Betadine [Povidone-Iodine]     Thank you for allowing pharmacy to be a part of this patient's care.  Excell Seltzer [place] 08/10/2020 12:12 AM

## 2020-08-10 NOTE — H&P (Signed)
History and Physical    Kaylee Romero [id number] DOB: Jul 03, 1980 DOA: 08/09/2020  PCP: Dion Body, MD  Patient coming from: Home  I have personally briefly reviewed patient's old medical records in [place]  Chief Complaint: Wound infection  HPI: Kaylee Romero is a 40 y.o. female with medical history significant of breast CA, malignant pleural effusion s/p CT drainage followed by pleurex catheter placement.  Pt presents to ED at [facility] with 24-48h of generalized weakness, malaise, fevers, chills, increased pain and erythema at Pleurx catheter site as well as the prior CT tube site.  No productive cough, no SOB.  Output from Pleurx catheter has been less over the past week or so.   ED Course: Pt septic with WBC 27k, fever 100.6, HR 124, BP 104/55.  Given 2L IVF bolus in ED.  Started on rocephin + vanc.  Dr. Kipp Brood called by EDP, asked that pt be transferred to [facility] and admitted by medicine.  CT chest shows: 1) cellulitis around catheter site 2) loculated L pleural effusion, smaller than previous 3) areas of consolidation in lingula and LLL concerning for PNA   Review of Systems: As per HPI, otherwise all review of systems negative.  Past Medical History:  Diagnosis Date   Anxiety    Family history of breast cancer    HSV (herpes simplex virus) anogenital infection    positive titer only    Past Surgical History:  Procedure Laterality Date   BREAST LUMPECTOMY WITH AXILLARY LYMPH NODE BIOPSY Right 11/21/2018   Procedure: RIGHT BREAST LUMPECTOMY WITH SENTINEL LYMPH NODE BIOPSY;  Surgeon: Jovita Kussmaul, MD;  Location: [place];  Service: General;  Laterality: Right;   BREAST REDUCTION SURGERY Bilateral 11/21/2018   Procedure: BILATERAL MAMMARY REDUCTION  (BREAST);  Surgeon: Wallace Going, DO;  Location: [place];  Service: Plastics;  Laterality: Bilateral;   CHEST TUBE INSERTION Left 07/29/2020   Procedure: INSERTION PLEURAL DRAINAGE CATHETER;   Surgeon: Lajuana Matte, MD;  Location: [place];  Service: Thoracic;  Laterality: Left;   PLEURAL BIOPSY Left 07/29/2020   Procedure: PLEURAL BIOPSY;  Surgeon: Lajuana Matte, MD;  Location: [place];  Service: Thoracic;  Laterality: Left;   PLEURAL EFFUSION DRAINAGE Left 07/29/2020   Procedure: DRAINAGE OF PLEURAL EFFUSION;  Surgeon: Lajuana Matte, MD;  Location: [place];  Service: Thoracic;  Laterality: Left;   PORT-A-CATH REMOVAL N/A 07/06/2019   Procedure: REMOVAL PORT-A-CATH;  Surgeon: Jovita Kussmaul, MD;  Location: [place];  Service: General;  Laterality: N/A;   PORTACATH PLACEMENT N/A 11/21/2018   Procedure: INSERTION LEFT PORT-A-CATH WITH ULTRASOUND GUIDANCE;  Surgeon: Jovita Kussmaul, MD;  Location: [place];  Service: General;  Laterality: N/A;   [place] Left 07/29/2020   Procedure: VIDEO ASSISTED THORACOSCOPY;  Surgeon: Lajuana Matte, MD;  Location: [place];  Service: Thoracic;  Laterality: Left;   WISDOM TOOTH EXTRACTION       reports that she quit smoking about 3 years ago. Her smoking use included cigarettes. She started smoking about 18 years ago. She has a 10.00 pack-year smoking history. She has never used smokeless tobacco. She reports current alcohol use. She reports that she does not use drugs.  Allergies  Allergen Reactions   Betadine [Povidone-Iodine]     Family History  Problem Relation Age of Onset   Hypertension Father    Alcohol abuse Paternal Grandfather  Heart disease Paternal Grandfather    Alcohol abuse Maternal Grandfather    Heart disease Maternal Grandmother    Breast cancer Other      Prior to Admission medications   Medication Sig Start Date End Date Taking? Authorizing Provider  amphetamine-dextroamphetamine (ADDERALL XR) 30 MG 24 hr capsule Take 30 mg by mouth daily.    [provider]  amphetamine-dextroamphetamine (ADDERALL) 10 MG tablet Take 10  mg by mouth daily in the afternoon.    [provider]  Ascorbic Acid (VITAMIN C) 500 MG CAPS Take 500 mg by mouth daily.    [provider]  cephALEXin (KEFLEX) 500 MG capsule Take 1 capsule (500 mg total) by mouth 3 (three) times daily. 08/09/20   Lajuana Matte, MD  HYDROcodone-acetaminophen (NORCO) 10-325 MG tablet Take 1 tablet by mouth every 4 (four) hours as needed for moderate pain. 08/09/20   Lajuana Matte, MD  hydrOXYzine (ATARAX/VISTARIL) 10 MG tablet Take 10 mg by mouth at bedtime as needed for anxiety or itching (Sleep).    [provider]  ibuprofen (ADVIL) 200 MG tablet Take 400 mg by mouth every 6 (six) hours as needed for mild pain.    [provider]  letrozole (FEMARA) 2.5 MG tablet Take 1 tablet (2.5 mg total) by mouth daily. 08/09/20   Lloyd Huger, MD  Multiple Vitamin (MULTIVITAMIN WITH MINERALS) TABS tablet Take 1 tablet by mouth daily.    [provider]  palbociclib (IBRANCE) 125 MG tablet Take 1 tablet (125 mg total) by mouth daily. Take for 21 days on, 7 days off, repeat every 28 days. 08/09/20   Lloyd Huger, MD  Tetrahydrozoline HCl (VISINE OP) Place 1 drop into both eyes daily as needed (redness).    [provider]  venlafaxine XR (EFFEXOR-XR) 75 MG 24 hr capsule Take 1 capsule (75 mg total) by mouth at bedtime. 07/29/20   Nani Skillern, PA-C  vitamin B-12 (CYANOCOBALAMIN) 1000 MCG tablet Take 1,000 mcg by mouth daily.    [provider]    Physical Exam: Vitals:   08/10/20 0002  BP: (!) 104/55  Pulse: (!) 117  Resp: (!) 21  Temp: (!) 100.6 F (38.1 C)  TempSrc: Oral  SpO2: 99%    Constitutional: NAD, calm, uncomfortable Eyes: PERRL, lids and conjunctivae normal ENMT: Mucous membranes are moist. Posterior pharynx clear of any exudate or lesions.Normal dentition.  Neck: normal, supple, no masses, no thyromegaly Respiratory: clear to auscultation bilaterally, no  wheezing, no crackles. Normal respiratory effort. No accessory muscle use.  Cardiovascular: Regular rate and rhythm, no murmurs / rubs / gallops. No extremity edema. 2+ pedal pulses. No carotid bruits.  Abdomen: no tenderness, no masses palpated. No hepatosplenomegaly. Bowel sounds positive.  Musculoskeletal: no clubbing / cyanosis. No joint deformity upper and lower extremities. Good ROM, no contractures. Normal muscle tone.  Skin: Erythema around pleurx catheter site, also erythema to L chest wall around prior CT tube site. Neurologic: CN 2-12 grossly intact. Sensation intact, DTR normal. Strength 5/5 in all 4.  Psychiatric: Normal judgment and insight. Alert and oriented x 3. Normal mood.    Labs on Admission: I have personally reviewed following labs and imaging studies  CBC: Recent Labs  Lab 08/09/20 1748  WBC 27.2*  NEUTROABS 25.0*  HGB 11.9*  HCT 35.1*  MCV 92.9  PLT 123456*   Basic Metabolic Panel: Recent Labs  Lab 08/09/20 1748  NA 132*  K 3.3*  CL 100  CO2 23  GLUCOSE 226*  BUN 9  CREATININE 0.63  CALCIUM 9.0   GFR: Estimated Creatinine Clearance: 93.1 mL/min (by C-G formula based on SCr of 0.63 mg/dL). Liver Function Tests: Recent Labs  Lab 08/09/20 1748  AST 27  ALT 14  ALKPHOS 54  BILITOT 0.6  PROT 7.4  ALBUMIN 3.4*   No results for input(s): LIPASE, AMYLASE in the last 168 hours. No results for input(s): AMMONIA in the last 168 hours. Coagulation Profile: No results for input(s): INR, PROTIME in the last 168 hours. Cardiac Enzymes: No results for input(s): CKTOTAL, CKMB, CKMBINDEX, TROPONINI in the last 168 hours. BNP (last 3 results) No results for input(s): PROBNP in the last 8760 hours. HbA1C: No results for input(s): HGBA1C in the last 72 hours. CBG: No results for input(s): GLUCAP in the last 168 hours. Lipid Profile: No results for input(s): CHOL, HDL, LDLCALC, TRIG, CHOLHDL, LDLDIRECT in the last 72 hours. Thyroid Function Tests: No  results for input(s): TSH, T4TOTAL, FREET4, T3FREE, THYROIDAB in the last 72 hours. Anemia Panel: No results for input(s): VITAMINB12, FOLATE, FERRITIN, TIBC, IRON, RETICCTPCT in the last 72 hours. Urine analysis:    Component Value Date/Time   COLORURINE YELLOW (A) 08/09/2020 1740   APPEARANCEUR CLEAR (A) 08/09/2020 1740   LABSPEC 1.017 08/09/2020 1740   PHURINE 5.0 08/09/2020 1740   GLUCOSEU NEGATIVE 08/09/2020 1740   HGBUR NEGATIVE 08/09/2020 1740   BILIRUBINUR NEGATIVE 08/09/2020 1740   BILIRUBINUR Neg 01/18/2012 1458   KETONESUR NEGATIVE 08/09/2020 1740   PROTEINUR NEGATIVE 08/09/2020 1740   UROBILINOGEN 0.2 01/18/2012 1458   NITRITE NEGATIVE 08/09/2020 1740   LEUKOCYTESUR NEGATIVE 08/09/2020 1740    Radiological Exams on Admission: DG Chest 2 View  Result Date: 08/09/2020 CLINICAL DATA:  Chest tube fever EXAM: CHEST - 2 VIEW COMPARISON:  07/30/2020, CT 07/03/2020 FINDINGS: Left lower chest drainage catheter slightly retracted with removal of previously noted additional pleural drainage catheter. Right lung grossly clear. Slightly improved aeration left base with residual left pleural disease and airspace disease at left base. Normal cardiac size. IMPRESSION: Slightly improved aeration at left base compared to prior radiograph. Otherwise no significant change in residual left pleural effusion/disease and mild airspace disease at left base Electronically Signed   By: Donavan Foil M.D.   On: 08/09/2020 18:25   CT Chest W Contrast  Result Date: 08/09/2020 CLINICAL DATA:  Sepsis. Left PleurX in place. Signs of cellulitis at access site. EXAM: CT CHEST WITH CONTRAST TECHNIQUE: Multidetector CT imaging of the chest was performed during intravenous contrast administration. CONTRAST:  96m OMNIPAQUE IOHEXOL 350 MG/ML SOLN COMPARISON:  07/03/2020 FINDINGS: Cardiovascular: Heart is normal size. Aorta is normal caliber. Mediastinum/Nodes: No mediastinal, hilar, or axillary adenopathy. Trachea  and esophagus are unremarkable. Thyroid unremarkable. Lungs/Pleura: Left PleurX catheter is in place in the left lower hemithorax. Loculated left hydropneumothorax present. Overall, amount of fluid has decreased since prior study. Airspace disease throughout the lingula and left lower lobe, similar to prior study. Right lung clear. Upper Abdomen: No acute findings Musculoskeletal: There is stranding in the left lateral subcutaneous soft tissues in the lower left chest and upper abdomen/flank region which could reflect cellulitis. No focal fluid collections. No acute bony abnormality. IMPRESSION: PleurX catheter in place on the left with loculated left hydropneumothorax. Overall, the fusion is smaller in size when compared to prior study. Continued areas of consolidation in the lingula and left lower lobe concerning for pneumonia, unchanged. Stranding in the subcutaneous soft tissues in the  lateral left lower chest wall and upper abdominal wall/flank compatible with cellulitis. Electronically Signed   By: Rolm Baptise M.D.   On: 08/09/2020 19:24    EKG: Independently reviewed.  Assessment/Plan Principal Problem:   Sepsis ([place]) Active Problems:   Malignant neoplasm of upper-outer quadrant of right breast in female, estrogen receptor positive ([place])   Pleural effusion on left    Sepsis - Pt has cellulitis around Plurex catheter site, at site of prior CT tube, ? PNA, also loculated pleural effusion (malignant) that now concerning for possible superimposed empyema. Sepsis pathway Empiric rocephin + vanc Got 2L IVF bolus in ED IVF: LR at 125 Serial lactates Ordering BCx Ordering pleural fluid studies: Culture Cell count with diff LDH pH Protein Keeping pt NPO and SCDs for DVT ppx until I can get a-hold of Dr. 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 Dilaudid ordered PRN pain Anxiety - cont hydroxyzine  DVT prophylaxis: SCDs Code Status: Full Family Communication: No family in room Disposition Plan: Home after sepsis resolved and cleared by CVTS Consults called: Dr. Kipp Brood Admission status: Admit to inpatient  Severity of Illness: The appropriate patient status for this patient is INPATIENT. Inpatient status is judged to be reasonable and necessary in order to provide the required intensity of service to ensure the patient's safety. The patient's presenting symptoms, physical exam findings, and initial radiographic and laboratory data in the context of their chronic comorbidities is felt to place them at high risk for further clinical deterioration. Furthermore, it is not anticipated that the patient will be medically stable for discharge from the hospital within 2 midnights of admission. The following factors support the patient status of inpatient.   IP status due to sepsis associated with infected Pleurx catheter, cellulitis of chest wall, ? Empyema.  * I certify that at the point of admission it is my clinical judgment that the patient will require inpatient hospital care spanning beyond 2 midnights from the point of admission due to high intensity of service, high risk for further deterioration and high frequency of surveillance required.*   Brix Brearley M. DO [facility]  How to contact the [facility] Attending or Consulting provider [place] or covering provider during after hours [place], for this patient?  Check the care team in [facility] and look for a) attending/consulting [facility] provider listed and b) the [facility] team listed Log into [email or web address]  Amion Physician Scheduling and messaging for groups and whole hospitals  On call and physician scheduling software for group practices, residents, hospitalists and other medical providers for call, clinic, rotation and shift schedules. OnCall  [employer] is a hospital-wide system for scheduling doctors and paging doctors on call. EasyPlot is for scientific plotting and data analysis.  [email or web address]  and use [place]'s universal password to access. If you do not have the password, please contact the hospital operator.  Locate the [facility] provider you are looking for under [facility] and page to a number that you can be directly reached. If you still have difficulty reaching the provider, please page the [facility] (Director on Call) for the Hospitalists listed on amion for assistance.  08/10/2020, 12:29 AM

## 2020-08-11 ENCOUNTER — Inpatient Hospital Stay (HOSPITAL_COMMUNITY): Payer: BC Managed Care – PPO

## 2020-08-11 LAB — CBC WITH DIFFERENTIAL/PLATELET
Abs Immature Granulocytes: 0.13 10*3/uL — ABNORMAL HIGH (ref 0.00–0.07)
Basophils Absolute: 0.1 10*3/uL (ref 0.0–0.1)
Basophils Relative: 0 %
Eosinophils Absolute: 0.3 10*3/uL (ref 0.0–0.5)
Eosinophils Relative: 2 %
HCT: 26.5 % — ABNORMAL LOW (ref 36.0–46.0)
Hemoglobin: 8.6 g/dL — ABNORMAL LOW (ref 12.0–15.0)
Immature Granulocytes: 1 %
Lymphocytes Relative: 8 %
Lymphs Abs: 1.4 10*3/uL (ref 0.7–4.0)
MCH: 30.9 pg (ref 26.0–34.0)
MCHC: 32.5 g/dL (ref 30.0–36.0)
MCV: 95.3 fL (ref 80.0–100.0)
Monocytes Absolute: 1.2 10*3/uL — ABNORMAL HIGH (ref 0.1–1.0)
Monocytes Relative: 7 %
Neutro Abs: 14 10*3/uL — ABNORMAL HIGH (ref 1.7–7.7)
Neutrophils Relative %: 82 %
Platelets: 368 10*3/uL (ref 150–400)
RBC: 2.78 MIL/uL — ABNORMAL LOW (ref 3.87–5.11)
RDW: 12.3 % (ref 11.5–15.5)
WBC: 17 10*3/uL — ABNORMAL HIGH (ref 4.0–10.5)
nRBC: 0 % (ref 0.0–0.2)

## 2020-08-11 LAB — PROCALCITONIN: Procalcitonin: 0.16 ng/mL

## 2020-08-11 LAB — BASIC METABOLIC PANEL
Anion gap: 7 (ref 5–15)
BUN: 5 mg/dL — ABNORMAL LOW (ref 6–20)
CO2: 27 mmol/L (ref 22–32)
Calcium: 8.5 mg/dL — ABNORMAL LOW (ref 8.9–10.3)
Chloride: 104 mmol/L (ref 98–111)
Creatinine, Ser: 0.51 mg/dL (ref 0.44–1.00)
GFR, Estimated: >60 mL/min (ref >60)
Glucose, Bld: 99 mg/dL (ref 70–99)
Potassium: 4.1 mmol/L (ref 3.5–5.1)
Sodium: 138 mmol/L (ref 135–145)

## 2020-08-11 LAB — VANCOMYCIN, PEAK: Vancomycin Pk: 24 ug/mL — ABNORMAL LOW (ref 30–40)

## 2020-08-11 IMAGING — CT CT CHEST-ABD-PELV W/ CM
2 of 5 series · 12 of 36 positions shown, 14 images · IV contrast (APPLIED)
Comparison: CT chest [DATE]

CLINICAL DATA: Empyema.  Pleural effusion.

EXAM:
CT CHEST, ABDOMEN, AND PELVIS WITH CONTRAST
TECHNIQUE: Multidetector CT imaging of the chest, abdomen and pelvis was
performed following the standard protocol during bolus
administration of intravenous contrast.
CONTRAST:  100mL OMNIPAQUE IOHEXOL 300 MG/ML  SOLN

[Series 3: cap 5.0 i31f 2 · axial · 0.98mm/px · z∈[+924,+1404]mm · 9 of 122 slices shown, 11 images]
[im 13/122  mediastinal]
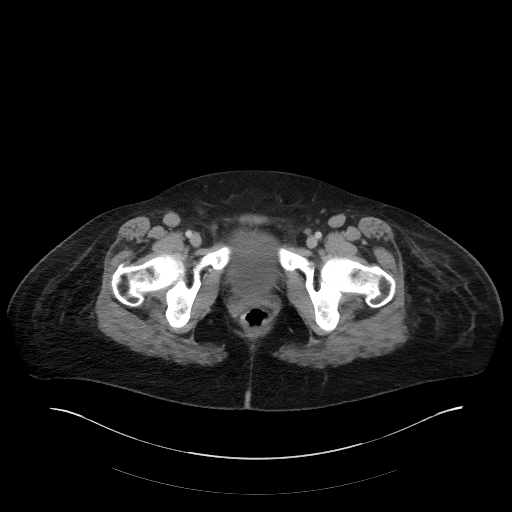
[im 13/122  bone]
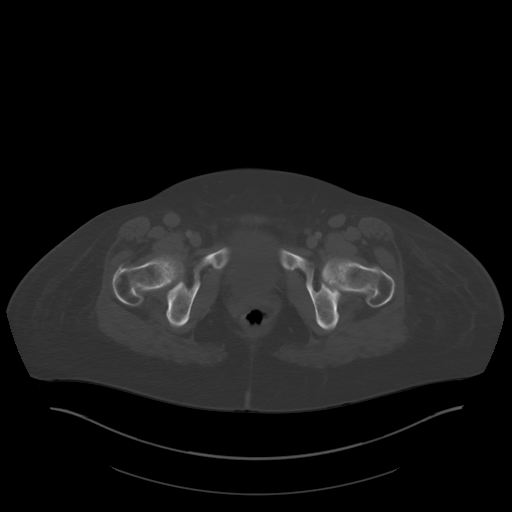
[im 25/122  mediastinal]
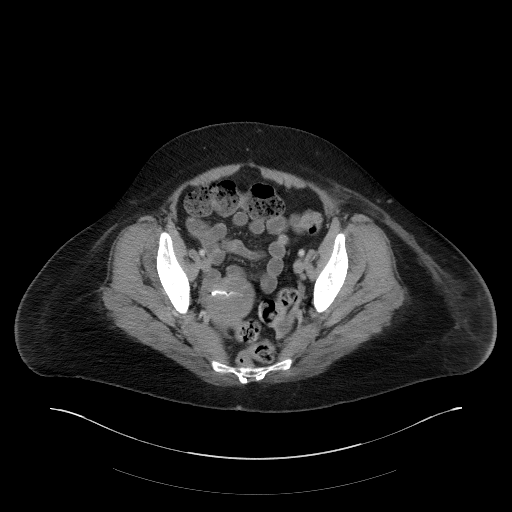
[im 37/122  mediastinal]
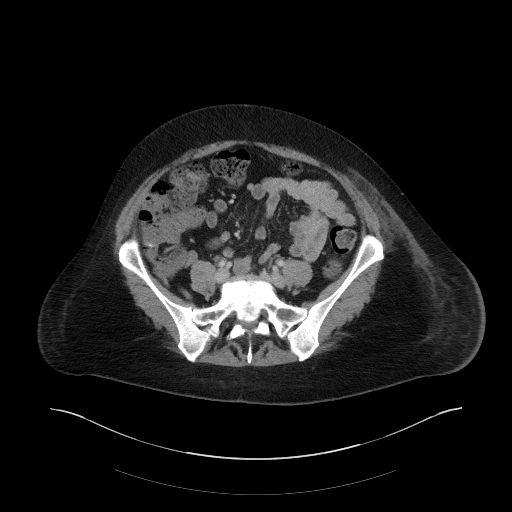
[im 49/122  mediastinal]
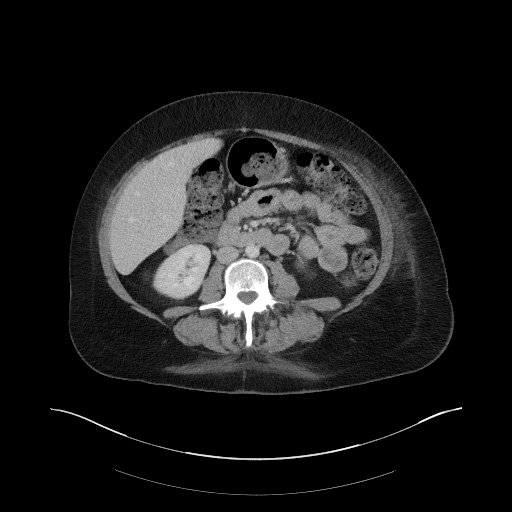
[im 61/122  mediastinal]
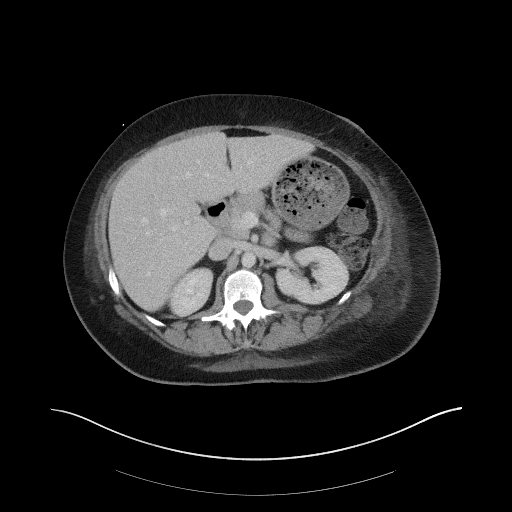
[im 73/122  mediastinal]
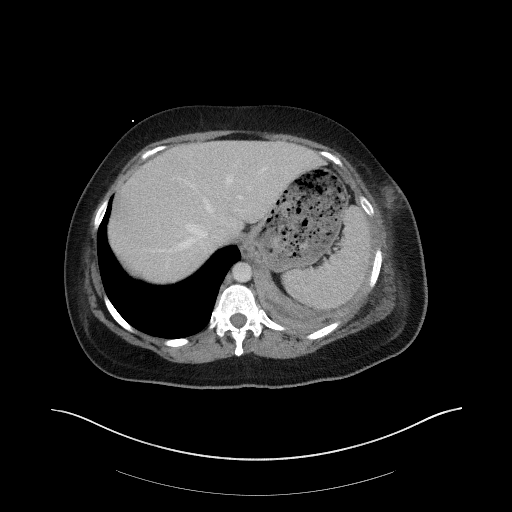
[im 85/122  mediastinal]
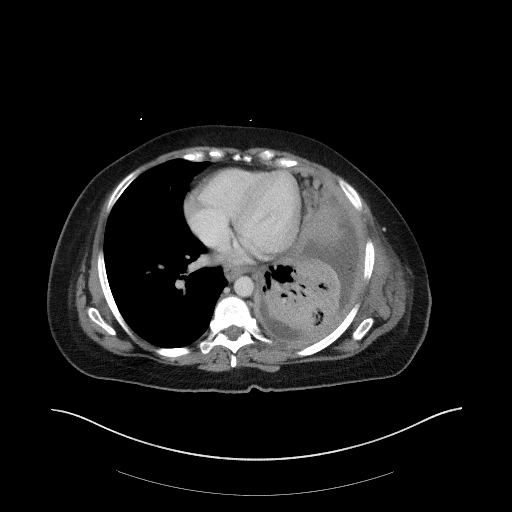
[im 97/122  mediastinal]
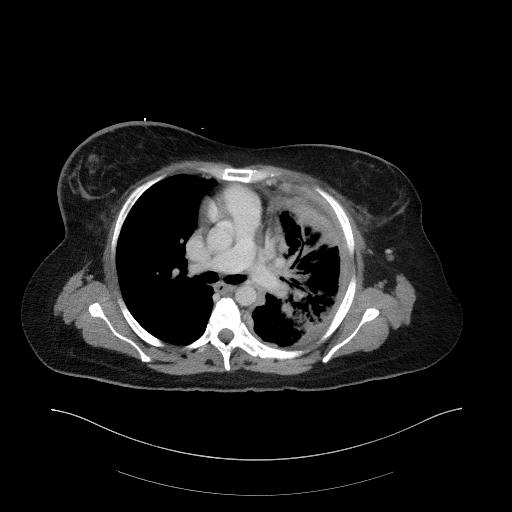
[im 109/122  mediastinal]
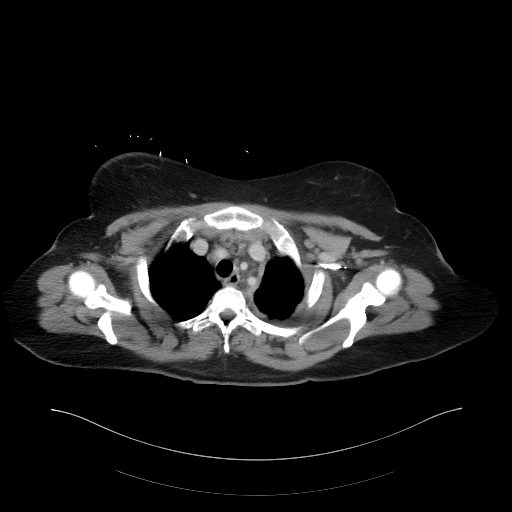
[im 109/122  bone]
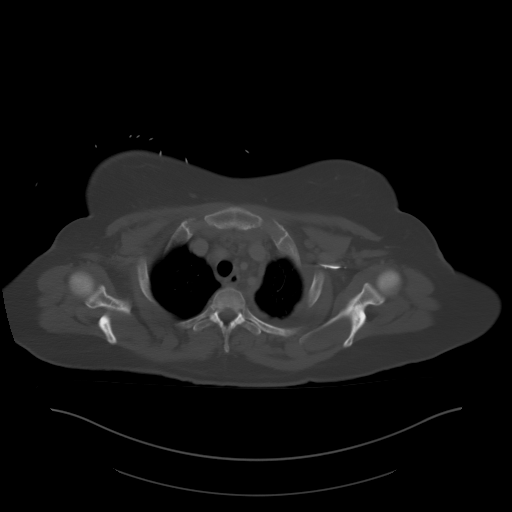

[Series 6: coronal · coronal · 0.87mm/px · 3 of 151 slices shown]
[im 31/151  mediastinal]
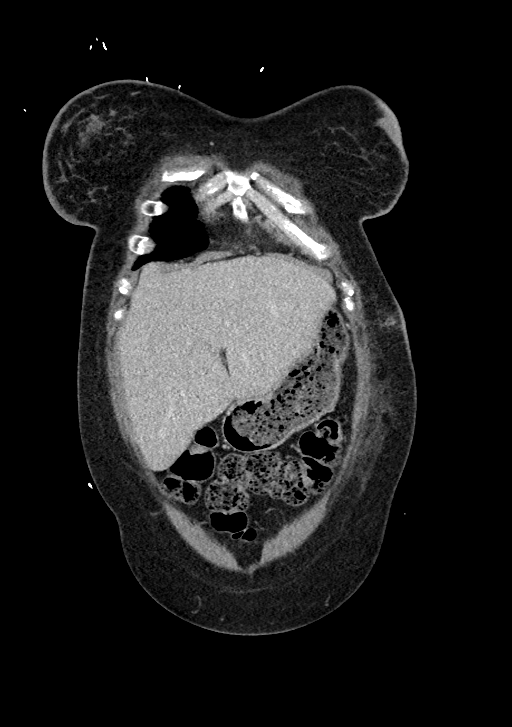
[im 61/151  mediastinal]
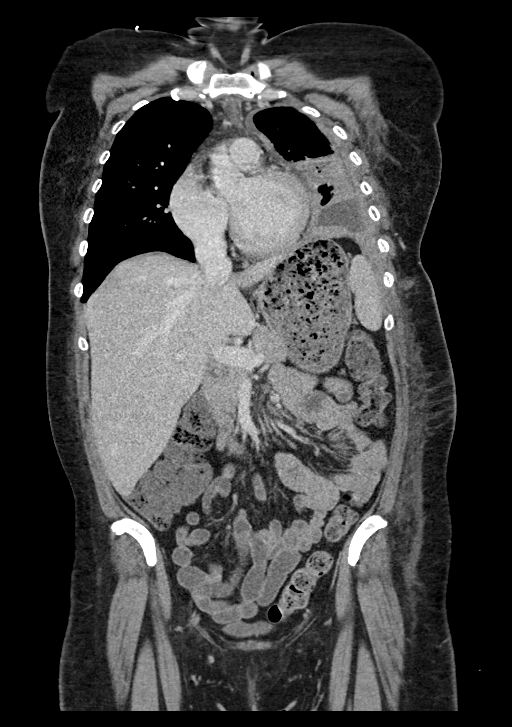
[im 91/151  mediastinal]
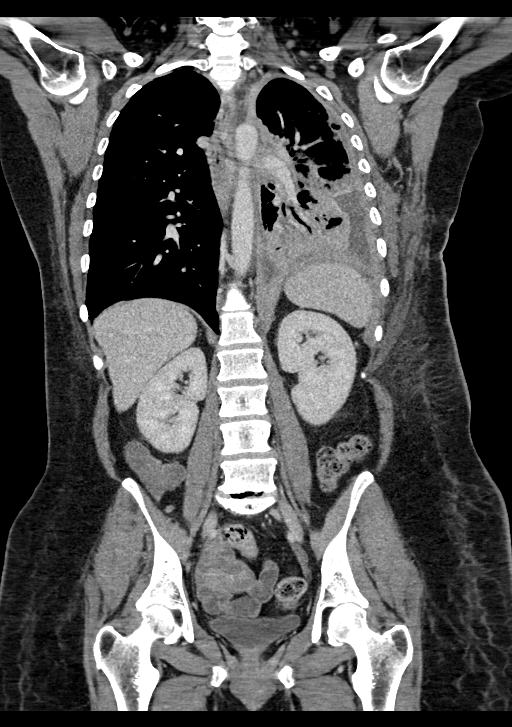

[12 of 36 positions shown; findings below may reference images not displayed]

FINDINGS: CT CHEST FINDINGS

Cardiovascular: No significant vascular findings. Normal heart size.
No pericardial effusion.

Mediastinum/Nodes: Small lymph nodes in the thoracic inlet and
anterior mediastinum measure less than 1 centimeter in diameter and
are likely reactive. Small lymph nodes are also noted at level 1, 2,
and 3 of the LEFT axilla, likely reactive. Patient has had previous
RIGHT axillary node dissection. The visualized portion of the
thyroid gland has a normal appearance. Esophagus is unremarkable.

Lungs/Pleura: LEFT PleurX catheter has been removed. There is
persistent pleural thickening and pleural rind with appearance
similar to prior studies. Small locules of gas are identified within
the pleural space at the LEFT lung base. There is increased
ground-glass opacity within the RIGHT UPPER and LOWER lobes, with
dense consolidation at the LEFT lung base, increased since prior
study. Air bronchograms are seen throughout the LEFT LOWER lobe and
LOWER LEFT UPPER lobe. A 4 millimeter nodule is identified in the
LATERAL aspect of the RIGHT UPPER lobe (image 62 of series 5). A
nodule measures 8 millimeters within the posterior RIGHT LOWER lobe
on image 99 of series 5.

Musculoskeletal: No chest wall mass or suspicious bone lesions
identified.

CT ABDOMEN PELVIS FINDINGS

Hepatobiliary: No focal liver abnormality is seen. No radiopaque
gallstones, biliary dilatation, or pericholecystic inflammatory
changes.

Pancreas: Unremarkable. No pancreatic ductal dilatation or
surrounding inflammatory changes.

Spleen: Normal in size without focal abnormality.

Adrenals/Urinary Tract: Adrenal glands are normal.

No renal mass or hydronephrosis. Ureters are unremarkable. The
bladder and visualized portion of the urethra are normal.

Stomach/Bowel: Stomach is distended by a significant debris.
Elevated LEFT hemidiaphragm. Small bowel loops are unremarkable.
Moderate stool burden. No obstructing colonic mass. Normal appendix.

Vascular/Lymphatic: No significant vascular findings are present. No
enlarged abdominal or pelvic lymph nodes.

Reproductive: Uterus contains intrauterine device, in expected
location. No adnexal mass. Small amount of free pelvic fluid is
likely physiologic.

Other: Body wall edema, LEFT greater than RIGHT.

Musculoskeletal: Degenerative changes are identified at L5-S1.
IMPRESSION: 1. Increased ground-glass opacity and consolidation in the LEFT
lung, consistent with infectious process.
2. Persistent loculated pleural effusion and thickened pleural rind,
consistent with empyema.
3. Stable RIGHT lung nodules, largest 8 millimeters. These warrant
follow-up; metastatic disease cannot be excluded.
4. Probably reactive lymph nodes in the thoracic inlet, anterior
mediastinum, and LEFT axilla.
5. Status post RIGHT axillary dissection.
6. Significant stool burden.
7. Body wall edema.

## 2020-08-11 IMAGING — DX DG CHEST 1V PORT
1 series · 1 of 1 positions shown · non-contrast
Comparison: [DATE]

CLINICAL DATA: Shortness of breath.  Malignant pleural effusion.

EXAM:
PORTABLE CHEST 1 VIEW

[chest]
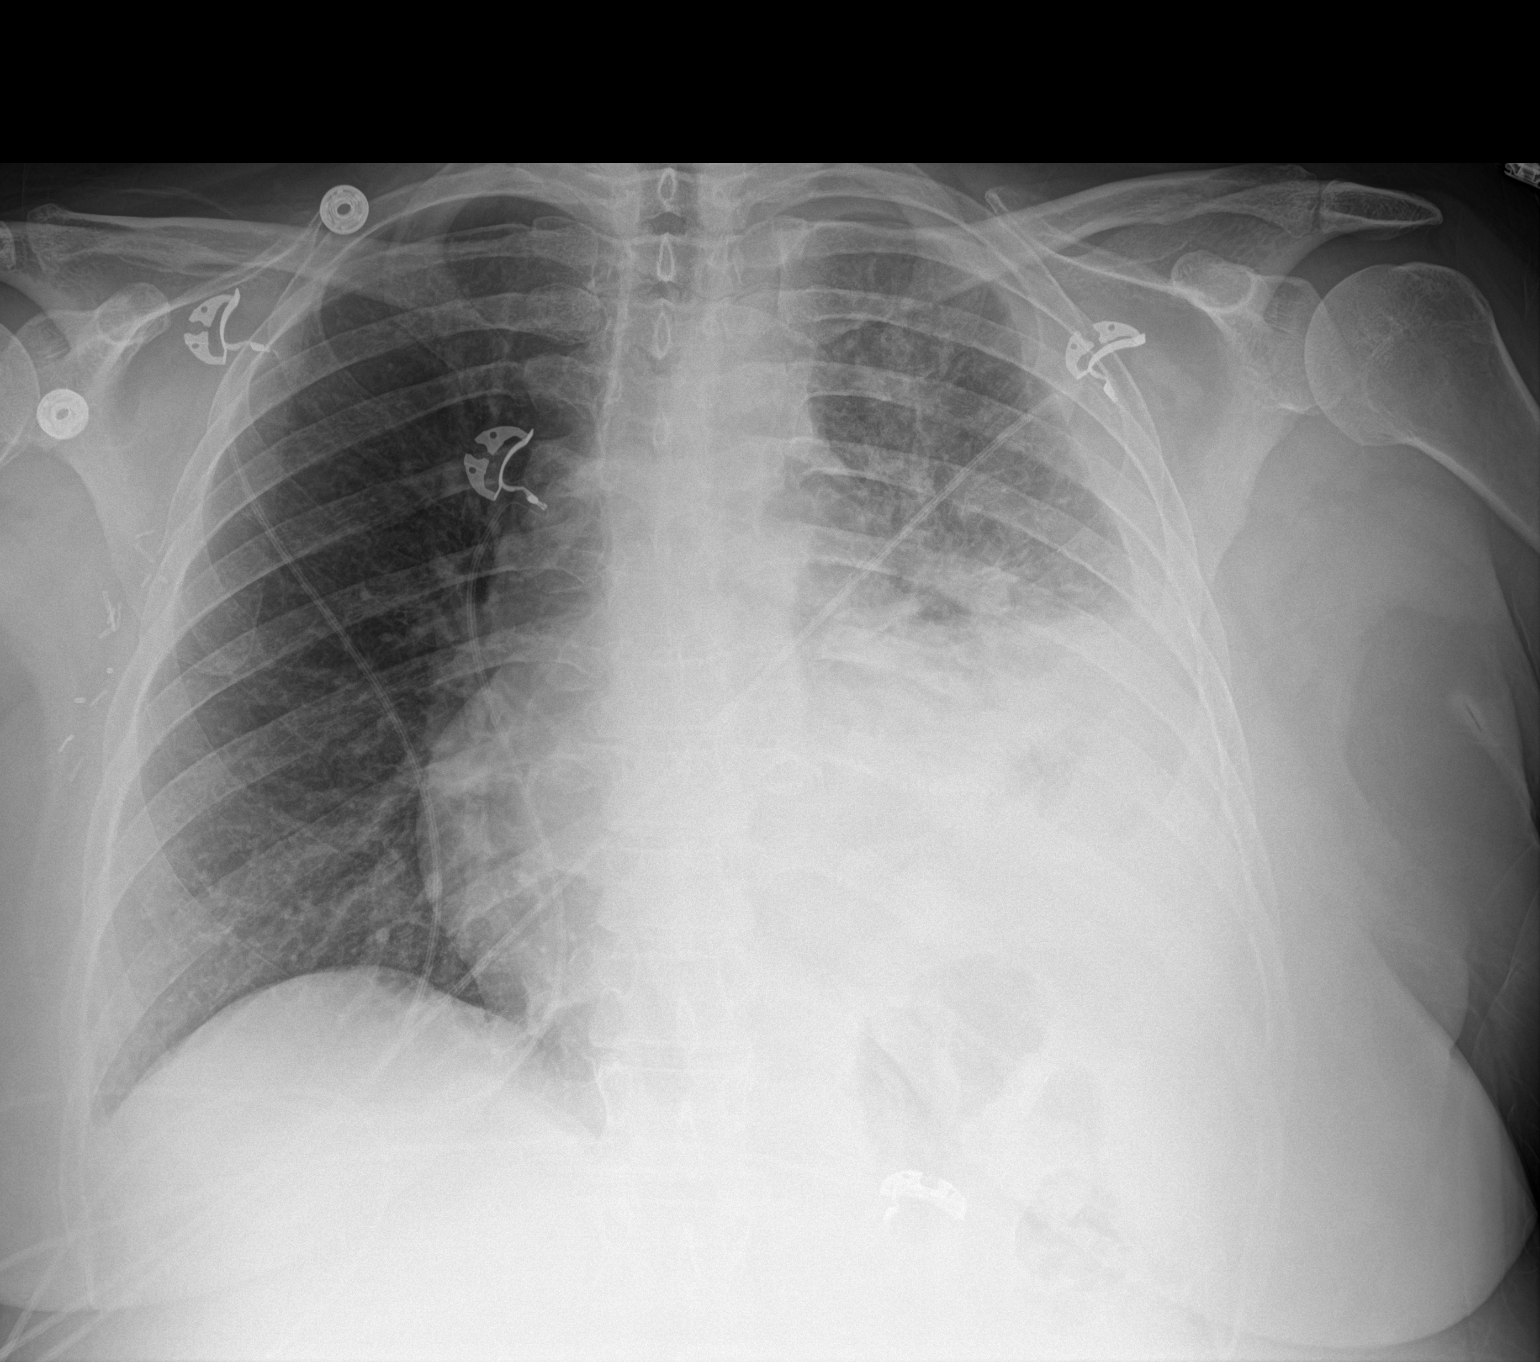

[1 of 1 positions shown; findings below may reference images not displayed]

FINDINGS: The LEFT pleural catheter has been removed.

A LEFT pleural effusion appears relatively unchanged.

Increasing LEFT mid and lower lung opacities noted which may
represent atelectasis and/or infection.

There is no evidence of pneumothorax.

The LEFT lung is clear.

No acute bony abnormalities are present.
IMPRESSION: Increasing LEFT mid and lower lung opacities which may represent
atelectasis and/or infection. LEFT pleural catheter removal without
significant change of LEFT pleural effusion.

## 2020-08-11 MED ORDER — IOHEXOL 300 MG/ML  SOLN
100.0000 mL | Freq: Once | INTRAMUSCULAR | Status: AC | PRN
  Administered 2020-08-11: 100 mL via INTRAVENOUS

## 2020-08-11 MED ORDER — DOCUSATE SODIUM 100 MG PO CAPS
100.0000 mg | ORAL_CAPSULE | Freq: Two times a day (BID) | ORAL | Status: DC
  Administered 2020-08-11 – 2020-08-14 (×7): 100 mg via ORAL
  Filled 2020-08-11 (×7): qty 1

## 2020-08-11 MED ORDER — LACTULOSE 10 GM/15ML PO SOLN: 20.0000 g | Freq: Every day | ORAL | Status: DC | PRN

## 2020-08-11 MED ORDER — LACTULOSE 10 GM/15ML PO SOLN
20.0000 g | Freq: Every day | ORAL | Status: DC | PRN
  Administered 2020-08-11 – 2020-08-13 (×3): 20 g via ORAL
  Filled 2020-08-11 (×3): qty 30

## 2020-08-11 MED ORDER — VANCOMYCIN HCL 1250 MG/250ML IV SOLN
1250.0000 mg | Freq: Two times a day (BID) | INTRAVENOUS | Status: DC
  Administered 2020-08-11 – 2020-08-12 (×2): 1250 mg via INTRAVENOUS
  Filled 2020-08-11 (×3): qty 250

## 2020-08-11 NOTE — Progress Notes (Signed)
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 Kipp Brood on 7/25 who initially presented to [facility] ED on 8/5 with subsequent transfer to [facility] hospital for fever, weakness/malaise, increased pain and redness at Pleurx catheter site.  Patient reports minimal output from Pleurx catheter.  In the ED,Temperature 100.6 F, HR 117, RR 21, BP 104/55, SPO2 98% on room air.  Sodium 132, potassium 3.3, chloride 100, CO2 23, glucose 226, BUN 9, creatinine 0.63.  Lactic acid 2.5.  WBC 27.2, hemoglobin 11.9, platelets 479.  COVID-19 PCR negative.  Influenza A/B PCR negative.  Urinalysis unrevealing.  Chest x-ray with slightly improved aeration left base compared to prior radiograph; no significant change left pleural effusion/disease.  CT chest with contrast with Pleurx catheter noted in place on left with loculated left hydropneumothorax, continued consolidation lingula and left lower lobe concerning for pneumonia, stranding subcutaneous soft tissues lateral left lower chest wall and upper abdominal wall/flank compatible with cellulitis.  Blood cultures x2 obtained.  Patient was started on empiric antibiotics with vancomycin and ceftriaxone.  Transferred from [facility] for CTS evaluation.  TRH consulted for admission.   Assessment & Plan:   Principal Problem:   Sepsis ([place]) Active Problems:   ADD (attention deficit disorder)   Malignant neoplasm of upper-outer quadrant of right breast in female, estrogen receptor positive (HCC)   Pleural effusion on left   Severe sepsis,  POA Loculated hydropneumothorax concerning for empyema, POA Left chest wall cellulitis, POA Lactic acidosis: Resolved Patient presenting as a transfer from [facility] with fever, weakness/malaise, shortness of breath.  CTchest with contrast with Pleurx catheter noted in place on left with loculated left hydropneumothorax, continued consolidation lingula and left lower lobe concerning for pneumonia, stranding subcutaneous soft tissues lateral left lower chest wall and upper abdominal wall/flank compatible with cellulitis.  Recent VATS/Pleurx placement by CTS Dr. Kipp Brood on 7/25.  Patient reports minimal drainage from catheter since placement.  Patient with fever 100.6, WBC count 27.2, lactic acid 2.5, and procalcitonin 0.24 on admission. --CTS, Dr. Kipp Brood following --WBC 27.2>24.5>17.0 --Lactic acid 2.5>1.8>1.0 --Procalcitonin 0.24>0.16 --Blood cultures x2 8/6: no growth x 1 day --wound culture from left pleurex site: pending --Ceftriaxone 2 g IV every 24 hours --Vancomycin, pharmacy consulted for dosing/monitoring --Supportive care, monitor fever curve, antipyretics as needed --CBC daily --Await further recommendations per CTS now given worsening consolidation left lower/middle lung field following Pleurx discontinuation yesterday.  Stage IV ER/PR positive, HER2 negative breast cancer with metastasis to lung pleura Patient follows with medical oncology outpatient, Dr. Grayland Ormond at [facility].  Recent VATS/Pleurx catheter placement by CTS, Dr. Kipp Brood on 7/25.  Initially diagnosed in 2020 and underwent chemotherapy, radiation and lumpectomy now with recurrence.  Planning to start Lupron, aromatase inhibitor and Ibrance. --Continue outpatient follow-up with oncology  Anxiety/Depression:  --Effexor $RemoveBe'75mg'VDSQlRBqe$  PO daily --xanax 0.$RemoveBeforeD'5mg'vDlPBLsrgakhBC$  TID prn  ADD: holding home Adderall  Constipation: --Colace 100 mg p.o. twice daily --Bisacodyl 5 mg p.o. daily as needed moderate constipation --Lactulose 20 g p.o. as  needed severe constipation  DVT prophylaxis: SCDs Start: 08/10/20 0009   Code Status: Full Code Family Communication:  No family present at bedside this morning  Disposition Plan:  Level of care: Progressive Status is: Inpatient  Remains inpatient appropriate because:Ongoing diagnostic testing needed not appropriate for outpatient work up, Unsafe d/c plan, IV treatments appropriate due to intensity of illness or inability to take PO, and Inpatient level of care appropriate due to severity of illness  Dispo: The patient is from: Home              Anticipated d/c is to: Home              Patient currently is not medically stable to d/c.   Difficult to place patient No  Consultants:  CTS, Dr. Kipp Brood  Procedures:  Pleurx catheter removal by CTS 8/6  Antimicrobials:  Vancomycin 8/6>> Ceftriaxone 8/6>>   Subjective: Patient seen examined at bedside, resting comfortably.  Patient overall feels better today but has shortness of breath and feeling like fever returning.  Seen by CTS this morning.  Repeat chest x-ray shows worsening consolidation of the left middle/lower lung fields following Pleurx drain removal yesterday. No other questions or concerns at this time.  Denies headache, no visual changes, no chest pain, no palpitations, no abdominal pain, no cough/congestion.  No acute concerns overnight per nursing staff.  Objective: Vitals:   08/10/20 2217 08/11/20 0038 08/11/20 0443 08/11/20 0952  BP: 112/62 (!) 117/54 123/63 135/73  Pulse: (!) 102 85 79 (!) 109  Resp: $Remo'20 14 20   'cdqGz$ Temp: 98.6 F (37 C) 98.2 F (36.8 C) 97.6 F (36.4 C) (!) 101.7 F (38.7 C)  TempSrc: Oral Oral Oral Oral  SpO2: 100% 97% 100% 100%  Weight:      Height:        Intake/Output Summary (Last 24 hours) at 08/11/2020 1228 Last data filed at 08/11/2020 0600 Gross per 24 hour  Intake 975 ml  Output --  Net 975 ml   Filed Weights   08/10/20 0002  Weight: 79.5 kg    Examination:  General exam:  Appears calm and comfortable, chronically ill in appearance Respiratory system: Clear to auscultation. Respiratory effort normal.  On room air Cardiovascular system: S1 & S2 heard, RRR. No JVD, murmurs, rubs, gallops or clicks. No pedal edema.  Left chest wall with edema/erythema surrounding Pleurx catheter site with tenderness to palpation Gastrointestinal system: Abdomen is nondistended, soft and nontender. No organomegaly or masses felt. Normal bowel sounds heard. Central nervous system: Alert and oriented. No focal neurological deficits. Extremities: Symmetric 5 x 5 power. Skin: No rashes, lesions or ulcers Psychiatry: Judgement and insight appear normal.  Depressed mood & flat affect.     Data Reviewed: I have personally reviewed following labs and imaging studies  CBC: Recent Labs  Lab 08/09/20 1748 08/10/20 0130 08/11/20 0430  WBC 27.2* 24.5* 17.0*  NEUTROABS 25.0*  --  14.0*  HGB 11.9* 10.5* 8.6*  HCT 35.1* 31.3* 26.5*  MCV 92.9 91.8 95.3  PLT 479* 419* 222   Basic Metabolic Panel: Recent Labs  Lab 08/09/20 1748 08/10/20 0130 08/11/20 0430  NA 132* 137 138  K 3.3* 3.7 4.1  CL 100 103 104  CO2 $Re'23 26 27  'gXk$ GLUCOSE 226* 149* 99  BUN 9 6 5*  CREATININE 0.63 0.56 0.51  CALCIUM 9.0 8.8* 8.5*   GFR: Estimated Creatinine Clearance: 94.4 mL/min (by C-G formula based on SCr of 0.51 mg/dL). Liver Function Tests: Recent Labs  Lab 08/09/20 1748 08/10/20 0130  AST 27 15  ALT 14 11  ALKPHOS 54  49  BILITOT 0.6 0.3  PROT 7.4 6.2*  ALBUMIN 3.4* 2.5*   No results for input(s): LIPASE, AMYLASE in the last 168 hours. No results for input(s): AMMONIA in the last 168 hours. Coagulation Profile: Recent Labs  Lab 08/10/20 0130  INR 1.1   Cardiac Enzymes: No results for input(s): CKTOTAL, CKMB, CKMBINDEX, TROPONINI in the last 168 hours. BNP (last 3 results) No results for input(s): PROBNP in the last 8760 hours. HbA1C: No results for input(s): HGBA1C in the last 72  hours. CBG: No results for input(s): GLUCAP in the last 168 hours. Lipid Profile: No results for input(s): CHOL, HDL, LDLCALC, TRIG, CHOLHDL, LDLDIRECT in the last 72 hours. Thyroid Function Tests: No results for input(s): TSH, T4TOTAL, FREET4, T3FREE, THYROIDAB in the last 72 hours. Anemia Panel: No results for input(s): VITAMINB12, FOLATE, FERRITIN, TIBC, IRON, RETICCTPCT in the last 72 hours. Sepsis Labs: Recent Labs  Lab 08/09/20 1748 08/10/20 0130 08/10/20 0445 08/11/20 0430  PROCALCITON  --  0.24  --  0.16  LATICACIDVEN 2.5* 1.8 1.0  --     Recent Results (from the past 240 hour(s))  Resp Panel by RT-PCR (Flu A&B, Covid) Nasopharyngeal Swab     Status: None   Collection Time: 08/09/20  6:37 PM   Specimen: Nasopharyngeal Swab; Nasopharyngeal(NP) swabs in vial transport medium  Result Value Ref Range Status   SARS Coronavirus 2 by RT PCR NEGATIVE NEGATIVE Final    Comment: (NOTE) SARS-CoV-2 target nucleic acids are NOT DETECTED.  The SARS-CoV-2 RNA is generally detectable in upper respiratory specimens during the acute phase of infection. The lowest concentration of SARS-CoV-2 viral copies this assay can detect is 138 copies/mL. A negative result does not preclude SARS-Cov-2 infection and should not be used as the sole basis for treatment or other patient management decisions. A negative result may occur with  improper specimen collection/handling, submission of specimen other than nasopharyngeal swab, presence of viral mutation(s) within the areas targeted by this assay, and inadequate number of viral copies(<138 copies/mL). A negative result must be combined with clinical observations, patient history, and epidemiological information. The expected result is Negative.  Fact Sheet for Patients:  EntrepreneurPulse.com.au  Fact Sheet for Healthcare Providers:  IncredibleEmployment.be  This test is no t yet approved or cleared by the  [place] FDA and  has been authorized for detection and/or diagnosis of SARS-CoV-2 by FDA under an Emergency Use Authorization (EUA). This EUA will remain  in effect (meaning this test can be used) for the duration of the COVID-19 declaration under Section 564(b)(1) of the Act, 21 U.S.C.section 360bbb-3(b)(1), unless the authorization is terminated  or revoked sooner.       Influenza A by PCR NEGATIVE NEGATIVE Final   Influenza B by PCR NEGATIVE NEGATIVE Final    Comment: (NOTE) The Xpert Xpress SARS-CoV-2/FLU/RSV plus assay is intended as an aid in the diagnosis of influenza from Nasopharyngeal swab specimens and should not be used as a sole basis for treatment. Nasal washings and aspirates are unacceptable for Xpert Xpress SARS-CoV-2/FLU/RSV testing.  Fact Sheet for Patients: EntrepreneurPulse.com.au  Fact Sheet for Healthcare Providers: IncredibleEmployment.be  This test is not yet approved or cleared by the [place] FDA and has been authorized for detection and/or diagnosis of SARS-CoV-2 by FDA under an Emergency Use Authorization (EUA). This EUA will remain in effect (meaning this test can be used) for the duration of the COVID-19 declaration under Section 564(b)(1) of the Act, 21 U.S.C. section 360bbb-3(b)(1), unless the  authorization is terminated or revoked.  Performed at [facility], [address] [place], [place] [address]   Culture, blood (routine x 2)     Status: None (Preliminary result)   Collection Time: 08/10/20 12:40 AM   Specimen: BLOOD LEFT HAND  Result Value Ref Range Status   Specimen Description BLOOD LEFT HAND  Final   Special Requests   Final    BOTTLES DRAWN AEROBIC AND ANAEROBIC Blood Culture results may not be optimal due to an inadequate volume of blood received in culture bottles   Culture   Final    NO GROWTH 1 DAY Performed at [facility], [place] [address] [place], [place]  [address]    Report Status PENDING  Incomplete  Culture, blood (routine x 2)     Status: None (Preliminary result)   Collection Time: 08/10/20  1:20 AM   Specimen: BLOOD LEFT HAND  Result Value Ref Range Status   Specimen Description BLOOD LEFT HAND  Final   Special Requests   Final    BOTTLES DRAWN AEROBIC ONLY Blood Culture results may not be optimal due to an inadequate volume of blood received in culture bottles   Culture   Final    NO GROWTH 1 DAY Performed at [facility], [place] [address] [place], [place] [address]    Report Status PENDING  Incomplete  Aerobic/Anaerobic Culture w Gram Stain (surgical/deep wound)     Status: None (Preliminary result)   Collection Time: 08/10/20  1:30 PM   Specimen: Pleura  Result Value Ref Range Status   Specimen Description PLEURAL SITE DRAINAGE  Final   Special Requests NONE  Final   Gram Stain   Final    FEW WBC PRESENT,BOTH PMN AND MONONUCLEAR NO ORGANISMS SEEN Performed at [facility], [address] [place], [place] [address]    Culture PENDING  Incomplete   Report Status PENDING  Incomplete         Radiology Studies: DG Chest 2 View  Result Date: 08/09/2020 CLINICAL DATA:  Chest tube fever EXAM: CHEST - 2 VIEW COMPARISON:  07/30/2020, CT 07/03/2020 FINDINGS: Left lower chest drainage catheter slightly retracted with removal of previously noted additional pleural drainage catheter. Right lung grossly clear. Slightly improved aeration left base with residual left pleural disease and airspace disease at left base. Normal cardiac size. IMPRESSION: Slightly improved aeration at left base compared to prior radiograph. Otherwise no significant change in residual left pleural effusion/disease and mild airspace disease at left base Electronically Signed   By: Donavan Foil M.D.   On: 08/09/2020 18:25   CT Chest W Contrast  Result Date: 08/09/2020 CLINICAL DATA:  Sepsis. Left PleurX in place. Signs of cellulitis at access site.  EXAM: CT CHEST WITH CONTRAST TECHNIQUE: Multidetector CT imaging of the chest was performed during intravenous contrast administration. CONTRAST:  71mL OMNIPAQUE IOHEXOL 350 MG/ML SOLN COMPARISON:  07/03/2020 FINDINGS: Cardiovascular: Heart is normal size. Aorta is normal caliber. Mediastinum/Nodes: No mediastinal, hilar, or axillary adenopathy. Trachea and esophagus are unremarkable. Thyroid unremarkable. Lungs/Pleura: Left PleurX catheter is in place in the left lower hemithorax. Loculated left hydropneumothorax present. Overall, amount of fluid has decreased since prior study. Airspace disease throughout the lingula and left lower lobe, similar to prior study. Right lung clear. Upper Abdomen: No acute findings Musculoskeletal: There is stranding in the left lateral subcutaneous soft tissues in the lower left chest and upper abdomen/flank region which could reflect cellulitis. No focal fluid  collections. No acute bony abnormality. IMPRESSION: PleurX catheter in place on the left with loculated left hydropneumothorax. Overall, the fusion is smaller in size when compared to prior study. Continued areas of consolidation in the lingula and left lower lobe concerning for pneumonia, unchanged. Stranding in the subcutaneous soft tissues in the lateral left lower chest wall and upper abdominal wall/flank compatible with cellulitis. Electronically Signed   By: Rolm Baptise M.D.   On: 08/09/2020 19:24   DG CHEST PORT 1 VIEW  Result Date: 08/11/2020 CLINICAL DATA:  Shortness of breath.  Malignant pleural effusion. EXAM: PORTABLE CHEST 1 VIEW COMPARISON:  08/09/2020 FINDINGS: The LEFT pleural catheter has been removed. A LEFT pleural effusion appears relatively unchanged. Increasing LEFT mid and lower lung opacities noted which may represent atelectasis and/or infection. There is no evidence of pneumothorax. The LEFT lung is clear. No acute bony abnormalities are present. IMPRESSION: Increasing LEFT mid and lower lung  opacities which may represent atelectasis and/or infection. LEFT pleural catheter removal without significant change of LEFT pleural effusion. Electronically Signed   By: Margarette Canada M.D.   On: 08/11/2020 11:27        Scheduled Meds:  docusate sodium  100 mg Oral BID   multivitamin with minerals  1 tablet Oral Daily   sodium chloride flush  10-40 mL Intracatheter Q12H   venlafaxine XR  75 mg Oral QHS   vitamin B-12  1,000 mcg Oral Daily   Continuous Infusions:  cefTRIAXone (ROCEPHIN)  IV 2 g (08/10/20 1847)   lactated ringers 75 mL/hr at 08/11/20 0729   vancomycin 750 mg (08/11/20 0219)     LOS: 2 days    Time spent: 41 minutes spent on chart review, discussion with nursing staff, consultants, updating family and interview/physical exam; more than 50% of that time was spent in counseling and/or coordination of care.    Menucha Dicesare J [place] DO [facility] Available via Epic secure chat 7am-7pm After these hours, please refer to coverage provider listed on [email or web address] 08/11/2020, 12:28 PM

## 2020-08-11 NOTE — Progress Notes (Signed)
     [address]       [place] [address]             [phone]       Febrile today Complains of more pleuritic symptoms  Vitals:   08/11/20 0952 08/11/20 1200  BP: 135/73 121/71  Pulse: (!) 109 99  Resp:  15  Temp: (!) 101.7 F (38.7 C) 98.6 F (37 C)  SpO2: 100% 100%   Alert NAD EWOB Blanching erythema over the pleurx track.  Will obtain CT to eval for pleural fluid, and residual subcutaneous fluid.  Lindzey Zent Bary Leriche

## 2020-08-11 NOTE — Progress Notes (Signed)
Pharmacy Antibiotic Note  Kaylee Romero is a 40 y.o. female admitted on 08/09/2020 with  sepsis secondary to infected pleurx catheter, cellulitis of chest wall, and empyema .  Pharmacy has been consulted for vancomycin dosing.  Vancomycin 1250 mg was started at [facility]. After transfer to [facility], patient was started on vancomycin 1750 mg q12h. This was empirically dose reduced to vancomycin '750mg'$  q12h based on patient's total body weight 79.5 kg and BMI >30. Peak and trough were ordered, but only peak was drawn on 8/7 '@0430'$ . Peak was 24. Progress note from 8/7 refers to "worsening consolidation left lower/middle lung field following Pleurx discontinuation yesterday." Based on this note, a lower than expected peak, and good renal function with Scr 0.51, dose was empirically increased to vancomycin '1250mg'$  q12h.   8/7 Vancomycin 1250 mg Q 12 hr Scr used: 0.8 mg/dL Weight: 79.5 kg Vd coeff: 0.72 L/kg Est AUC: 528  Plan: Empirically increase to vancomycin 1250 mg q12h Continue ceftriaxone 2 g q24h Follow-up cultures Monitor renal function  Height: '5\' 4"'$  (162.6 cm) Weight: 79.5 kg (175 lb 3.2 oz) IBW/kg (Calculated) : 54.7  Temp (24hrs), Avg:99.1 F (37.3 C), Min:97.6 F (36.4 C), Max:101.7 F (38.7 C)  Recent Labs  Lab 08/09/20 1748 08/10/20 0130 08/10/20 0445 08/11/20 0430  WBC 27.2* 24.5*  --  17.0*  CREATININE 0.63 0.56  --  0.51  LATICACIDVEN 2.5* 1.8 1.0  --   VANCOPEAK  --   --   --  24*     Estimated Creatinine Clearance: 94.4 mL/min (by C-G formula based on SCr of 0.51 mg/dL).    Allergies  Allergen Reactions   Betadine [Povidone-Iodine]     Antimicrobials this admission: Ceftriaxone 8/5>> Vanc 8/5>>  Microbiology results: 8/6 Bcx: ng x1d 8/6 pleural fluid cx: reincubated  Thank you for allowing pharmacy to be a part of this patient's care.  Zenaida Deed, PharmD PGY1 Acute Care Pharmacy Resident  Phone: [phone] 08/11/2020  2:55 PM  Please check [email or web address]  for unit-specific pharmacy phone numbers.

## 2020-08-12 ENCOUNTER — Encounter (HOSPITAL_COMMUNITY): Payer: Self-pay | Admitting: Internal Medicine

## 2020-08-12 ENCOUNTER — Other Ambulatory Visit (HOSPITAL_COMMUNITY): Payer: Self-pay

## 2020-08-12 LAB — BASIC METABOLIC PANEL
Anion gap: 10 (ref 5–15)
BUN: 6 mg/dL (ref 6–20)
CO2: 23 mmol/L (ref 22–32)
Calcium: 8.3 mg/dL — ABNORMAL LOW (ref 8.9–10.3)
Chloride: 102 mmol/L (ref 98–111)
Creatinine, Ser: 0.44 mg/dL (ref 0.44–1.00)
GFR, Estimated: >60 mL/min (ref >60)
Glucose, Bld: 110 mg/dL — ABNORMAL HIGH (ref 70–99)
Potassium: 4.8 mmol/L (ref 3.5–5.1)
Sodium: 135 mmol/L (ref 135–145)

## 2020-08-12 LAB — CBC WITH DIFFERENTIAL/PLATELET
Abs Immature Granulocytes: 0.06 10*3/uL (ref 0.00–0.07)
Basophils Absolute: 0.1 10*3/uL (ref 0.0–0.1)
Basophils Relative: 1 %
Eosinophils Absolute: 0.4 10*3/uL (ref 0.0–0.5)
Eosinophils Relative: 3 %
HCT: 28.2 % — ABNORMAL LOW (ref 36.0–46.0)
Hemoglobin: 9.1 g/dL — ABNORMAL LOW (ref 12.0–15.0)
Immature Granulocytes: 1 %
Lymphocytes Relative: 10 %
Lymphs Abs: 1.2 10*3/uL (ref 0.7–4.0)
MCH: 30.5 pg (ref 26.0–34.0)
MCHC: 32.3 g/dL (ref 30.0–36.0)
MCV: 94.6 fL (ref 80.0–100.0)
Monocytes Absolute: 1.1 10*3/uL — ABNORMAL HIGH (ref 0.1–1.0)
Monocytes Relative: 9 %
Neutro Abs: 10.1 10*3/uL — ABNORMAL HIGH (ref 1.7–7.7)
Neutrophils Relative %: 76 %
Platelets: 412 10*3/uL — ABNORMAL HIGH (ref 150–400)
RBC: 2.98 MIL/uL — ABNORMAL LOW (ref 3.87–5.11)
RDW: 12.3 % (ref 11.5–15.5)
WBC: 12.9 10*3/uL — ABNORMAL HIGH (ref 4.0–10.5)
nRBC: 0 % (ref 0.0–0.2)

## 2020-08-12 LAB — LACTIC ACID, PLASMA: Lactic Acid, Venous: 1.8 mmol/L (ref 0.5–1.9)

## 2020-08-12 LAB — PROCALCITONIN: Procalcitonin: 0.7 ng/mL

## 2020-08-12 MED ORDER — CHLORHEXIDINE GLUCONATE 0.12 % MT SOLN
OROMUCOSAL | Status: AC
  Administered 2020-08-12: 15 mL via OROMUCOSAL
  Filled 2020-08-12: qty 15

## 2020-08-12 MED ORDER — MIDAZOLAM HCL 2 MG/2ML IJ SOLN
INTRAMUSCULAR | Status: AC
  Filled 2020-08-12: qty 2

## 2020-08-12 MED ORDER — LIDOCAINE HCL (PF) 1 % IJ SOLN
INTRAMUSCULAR | Status: AC
  Filled 2020-08-12: qty 30

## 2020-08-12 MED ORDER — FLUCONAZOLE 150 MG PO TABS
150.0000 mg | ORAL_TABLET | Freq: Once | ORAL | Status: AC
  Administered 2020-08-12: 150 mg via ORAL
  Filled 2020-08-12: qty 1

## 2020-08-12 MED ORDER — FENTANYL CITRATE (PF) 250 MCG/5ML IJ SOLN
INTRAMUSCULAR | Status: AC
  Filled 2020-08-12: qty 5

## 2020-08-12 MED ORDER — CHLORHEXIDINE GLUCONATE 0.12 % MT SOLN: 15.0000 mL | Freq: Once | OROMUCOSAL | Status: AC

## 2020-08-12 MED ORDER — HYDROCORTISONE 1 % EX CREA
1.0000 "application " | TOPICAL_CREAM | Freq: Two times a day (BID) | CUTANEOUS | Status: DC
  Administered 2020-08-12 – 2020-08-14 (×6): 1 via TOPICAL
  Filled 2020-08-12 (×2): qty 28

## 2020-08-12 MED ORDER — BISACODYL 5 MG PO TBEC
10.0000 mg | DELAYED_RELEASE_TABLET | Freq: Every day | ORAL | Status: DC | PRN
  Administered 2020-08-12 – 2020-08-13 (×2): 10 mg via ORAL
  Filled 2020-08-12 (×2): qty 2

## 2020-08-12 MED ORDER — ORAL CARE MOUTH RINSE: 15.0000 mL | Freq: Once | OROMUCOSAL | Status: AC

## 2020-08-12 MED ORDER — LACTATED RINGERS IV SOLN: INTRAVENOUS | Status: DC

## 2020-08-12 MED ORDER — BUPIVACAINE HCL (PF) 0.5 % IJ SOLN
INTRAMUSCULAR | Status: AC
  Filled 2020-08-12: qty 30

## 2020-08-12 MED ORDER — GUAIFENESIN-DM 100-10 MG/5ML PO SYRP
5.0000 mL | ORAL_SOLUTION | ORAL | Status: DC | PRN
  Administered 2020-08-12 – 2020-08-15 (×4): 5 mL via ORAL
  Filled 2020-08-12 (×5): qty 5

## 2020-08-12 NOTE — Progress Notes (Addendum)
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 Kipp Brood on 7/25 who initially presented to [facility] ED on 8/5 with subsequent transfer to [facility] hospital for fever, weakness/malaise, increased pain and redness at Pleurx catheter site.  Patient reports minimal output from Pleurx catheter.  In the ED,Temperature 100.6 F, HR 117, RR 21, BP 104/55, SPO2 98% on room air.  Sodium 132, potassium 3.3, chloride 100, CO2 23, glucose 226, BUN 9, creatinine 0.63.  Lactic acid 2.5.  WBC 27.2, hemoglobin 11.9, platelets 479.  COVID-19 PCR negative.  Influenza A/B PCR negative.  Urinalysis unrevealing.  Chest x-ray with slightly improved aeration left base compared to prior radiograph; no significant change left pleural effusion/disease.  CT chest with contrast with Pleurx catheter noted in place on left with loculated left hydropneumothorax, continued consolidation lingula and left lower lobe concerning for pneumonia, stranding subcutaneous soft tissues lateral left lower chest wall and upper abdominal wall/flank compatible with cellulitis.  Blood cultures x2 obtained.  Patient was started on empiric antibiotics with vancomycin and ceftriaxone.  Transferred from [facility] for CTS evaluation.  TRH consulted for admission.   Assessment & Plan:   Principal Problem:   Sepsis ([place]) Active Problems:   ADD (attention deficit disorder)   Malignant neoplasm of upper-outer quadrant of right breast in female, estrogen receptor positive (HCC)   Pleural effusion on left   Severe sepsis,  POA Loculated hydropneumothorax concerning for empyema, POA Left chest wall cellulitis, POA Lactic acidosis: Resolved Patient presenting as a transfer from [facility] with fever, weakness/malaise, shortness of breath.  CTchest with contrast with Pleurx catheter noted in place on left with loculated left hydropneumothorax, continued consolidation lingula and left lower lobe concerning for pneumonia, stranding subcutaneous soft tissues lateral left lower chest wall and upper abdominal wall/flank compatible with cellulitis.  Recent VATS/Pleurx placement by CTS Dr. Kipp Brood on 7/25.  Patient reports minimal drainage from catheter since placement.  Patient with fever 100.6, WBC count 27.2, lactic acid 2.5, and procalcitonin 0.24 on admission.  CT chest/abdomen/pelvis with contrast 8/7 with increased GGO/consolidation left lung, persistent loculated pleural effusion and thickened pleural rind consistent with empyema. --CTS, Dr. Kipp Brood following --WBC 27.2>24.5>17.0>12.9 --Lactic acid 2.5>1.8>1.0 --Procalcitonin 0.24>0.16>0.70 --Blood cultures x2 8/6: no growth x 1 day --wound culture from left pleurex site: pending --Ceftriaxone 2 g IV every 24 hours --Vancomycin, pharmacy consulted for dosing/monitoring --Supportive care, monitor fever curve, antipyretics as needed --CBC daily --CTS plans incision and drainage of chest wall abscess with likely wound VAC placement today, NPO  Stage IV ER/PR positive, HER2 negative breast cancer with metastasis to lung pleura Patient follows with medical oncology outpatient, Dr. Grayland Ormond at [facility].  Recent VATS/Pleurx catheter placement by CTS, Dr. Kipp Brood on 7/25.  Initially diagnosed in 2020 and underwent chemotherapy, radiation and lumpectomy now with recurrence.  Planning to start Lupron, aromatase inhibitor and Ibrance. --Continue outpatient follow-up with oncology  Anxiety/Depression:  --Effexor $RemoveBe'75mg'iTkqdLTXo$  PO daily --xanax 0.$RemoveBeforeD'5mg'FBwcMJTdKmiElA$  TID prn  ADD: holding home  Adderall  Constipation: --Colace 100 mg p.o. twice daily --Bisacodyl 5 mg p.o. daily as needed moderate constipation --Lactulose 20  g p.o. as needed severe constipation  DVT prophylaxis: SCDs Start: 08/10/20 0009   Code Status: Full Code Family Communication: No family present at bedside this morning  Disposition Plan:  Level of care: Progressive Status is: Inpatient  Remains inpatient appropriate because:Ongoing diagnostic testing needed not appropriate for outpatient work up, Unsafe d/c plan, IV treatments appropriate due to intensity of illness or inability to take PO, and Inpatient level of care appropriate due to severity of illness  Dispo: The patient is from: Home              Anticipated d/c is to: Home              Patient currently is not medically stable to d/c.   Difficult to place patient No  Consultants:  CTS, Dr. Kipp Brood  Procedures:  Pleurx catheter removal by CTS 8/6  Antimicrobials:  Vancomycin 8/6>> Ceftriaxone 8/6>>   Subjective: Patient seen examined at bedside, resting comfortably.  Sleeping but easily arousable.  Feels little better today but with considerable pain and tenderness to left chest where previous Pleurx drain was placed.  Seen by CTS this morning with plan for incision and drainage of chest wall abscess with likely wound VAC placement today.  No family present at bedside this morning.  No other questions or concerns at this time.  Denies headache, no visual changes, no chest pain, no palpitations, no abdominal pain, no cough/congestion.  No acute concerns overnight per nursing staff.  Objective: Vitals:   08/11/20 2000 08/11/20 2246 08/12/20 0700 08/12/20 0815  BP:  114/73 120/65 (!) 120/59  Pulse:  87 (!) 110 96  Resp:  $Remo'17 20 18  'PmTak$ Temp: 98.6 F (37 C) 98.1 F (36.7 C) 98.5 F (36.9 C) 98.7 F (37.1 C)  TempSrc: Oral Oral Oral Oral  SpO2:  98% 95% 93%  Weight:      Height:       No intake or output data in the 24 hours ending  08/12/20 1118  Filed Weights   08/10/20 0002  Weight: 79.5 kg    Examination:  General exam: Appears calm and comfortable, chronically ill in appearance Respiratory system: Clear to auscultation. Respiratory effort normal.  On room air Cardiovascular system: S1 & S2 heard, RRR. No JVD, murmurs, rubs, gallops or clicks. No pedal edema.  Left chest wall with edema/erythema surrounding previous Pleurx catheter site with tenderness to palpation Gastrointestinal system: Abdomen is nondistended, soft and nontender. No organomegaly or masses felt. Normal bowel sounds heard. Central nervous system: Alert and oriented. No focal neurological deficits. Extremities: Symmetric 5 x 5 power. Skin: No rashes, lesions or ulcers Psychiatry: Judgement and insight appear normal.  Depressed mood & flat affect.     Data Reviewed: I have personally reviewed following labs and imaging studies  CBC: Recent Labs  Lab 08/09/20 1748 08/10/20 0130 08/11/20 0430 08/12/20 0315  WBC 27.2* 24.5* 17.0* 12.9*  NEUTROABS 25.0*  --  14.0* 10.1*  HGB 11.9* 10.5* 8.6* 9.1*  HCT 35.1* 31.3* 26.5* 28.2*  MCV 92.9 91.8 95.3 94.6  PLT 479* 419* 368 333*   Basic Metabolic Panel: Recent Labs  Lab 08/09/20 1748 08/10/20 0130 08/11/20 0430 08/12/20 0315  NA 132* 137 138 135  K 3.3* 3.7 4.1 4.8  CL 100 103 104 102  CO2 $Re'23 26 27 23  'MnI$ GLUCOSE 226* 149* 99 110*  BUN 9 6 5* 6  CREATININE 0.63 0.56 0.51 0.44  CALCIUM 9.0 8.8* 8.5* 8.3*   GFR: Estimated  Creatinine Clearance: 94.4 mL/min (by C-G formula based on SCr of 0.44 mg/dL). Liver Function Tests: Recent Labs  Lab 08/09/20 1748 08/10/20 0130  AST 27 15  ALT 14 11  ALKPHOS 54 49  BILITOT 0.6 0.3  PROT 7.4 6.2*  ALBUMIN 3.4* 2.5*   No results for input(s): LIPASE, AMYLASE in the last 168 hours. No results for input(s): AMMONIA in the last 168 hours. Coagulation Profile: Recent Labs  Lab 08/10/20 0130  INR 1.1   Cardiac Enzymes: No results for  input(s): CKTOTAL, CKMB, CKMBINDEX, TROPONINI in the last 168 hours. BNP (last 3 results) No results for input(s): PROBNP in the last 8760 hours. HbA1C: No results for input(s): HGBA1C in the last 72 hours. CBG: No results for input(s): GLUCAP in the last 168 hours. Lipid Profile: No results for input(s): CHOL, HDL, LDLCALC, TRIG, CHOLHDL, LDLDIRECT in the last 72 hours. Thyroid Function Tests: No results for input(s): TSH, T4TOTAL, FREET4, T3FREE, THYROIDAB in the last 72 hours. Anemia Panel: No results for input(s): VITAMINB12, FOLATE, FERRITIN, TIBC, IRON, RETICCTPCT in the last 72 hours. Sepsis Labs: Recent Labs  Lab 08/09/20 1748 08/10/20 0130 08/10/20 0445 08/11/20 0430 08/12/20 0315  PROCALCITON  --  0.24  --  0.16 0.70  LATICACIDVEN 2.5* 1.8 1.0  --  1.8    Recent Results (from the past 240 hour(s))  Resp Panel by RT-PCR (Flu A&B, Covid) Nasopharyngeal Swab     Status: None   Collection Time: 08/09/20  6:37 PM   Specimen: Nasopharyngeal Swab; Nasopharyngeal(NP) swabs in vial transport medium  Result Value Ref Range Status   SARS Coronavirus 2 by RT PCR NEGATIVE NEGATIVE Final    Comment: (NOTE) SARS-CoV-2 target nucleic acids are NOT DETECTED.  The SARS-CoV-2 RNA is generally detectable in upper respiratory specimens during the acute phase of infection. The lowest concentration of SARS-CoV-2 viral copies this assay can detect is 138 copies/mL. A negative result does not preclude SARS-Cov-2 infection and should not be used as the sole basis for treatment or other patient management decisions. A negative result may occur with  improper specimen collection/handling, submission of specimen other than nasopharyngeal swab, presence of viral mutation(s) within the areas targeted by this assay, and inadequate number of viral copies(<138 copies/mL). A negative result must be combined with clinical observations, patient history, and epidemiological information. The expected  result is Negative.  Fact Sheet for Patients:  EntrepreneurPulse.com.au  Fact Sheet for Healthcare Providers:  IncredibleEmployment.be  This test is no t yet approved or cleared by the [place] FDA and  has been authorized for detection and/or diagnosis of SARS-CoV-2 by FDA under an Emergency Use Authorization (EUA). This EUA will remain  in effect (meaning this test can be used) for the duration of the COVID-19 declaration under Section 564(b)(1) of the Act, 21 U.S.C.section 360bbb-3(b)(1), unless the authorization is terminated  or revoked sooner.       Influenza A by PCR NEGATIVE NEGATIVE Final   Influenza B by PCR NEGATIVE NEGATIVE Final    Comment: (NOTE) The Xpert Xpress SARS-CoV-2/FLU/RSV plus assay is intended as an aid in the diagnosis of influenza from Nasopharyngeal swab specimens and should not be used as a sole basis for treatment. Nasal washings and aspirates are unacceptable for Xpert Xpress SARS-CoV-2/FLU/RSV testing.  Fact Sheet for Patients: EntrepreneurPulse.com.au  Fact Sheet for Healthcare Providers: IncredibleEmployment.be  This test is not yet approved or cleared by the [place] FDA and has been authorized for detection and/or diagnosis of SARS-CoV-2  by FDA under an Emergency Use Authorization (EUA). This EUA will remain in effect (meaning this test can be used) for the duration of the COVID-19 declaration under Section 564(b)(1) of the Act, 21 U.S.C. section 360bbb-3(b)(1), unless the authorization is terminated or revoked.  Performed at [facility], [address] [place], [place] [address]   Culture, blood (routine x 2)     Status: None (Preliminary result)   Collection Time: 08/10/20 12:40 AM   Specimen: BLOOD LEFT HAND  Result Value Ref Range Status   Specimen Description BLOOD LEFT HAND  Final   Special Requests   Final    BOTTLES DRAWN AEROBIC AND  ANAEROBIC Blood Culture results may not be optimal due to an inadequate volume of blood received in culture bottles   Culture   Final    NO GROWTH 1 DAY Performed at [facility], [place] [address], [place], [place] [address]    Report Status PENDING  Incomplete  Culture, blood (routine x 2)     Status: None (Preliminary result)   Collection Time: 08/10/20  1:20 AM   Specimen: BLOOD LEFT HAND  Result Value Ref Range Status   Specimen Description BLOOD LEFT HAND  Final   Special Requests   Final    BOTTLES DRAWN AEROBIC ONLY Blood Culture results may not be optimal due to an inadequate volume of blood received in culture bottles   Culture   Final    NO GROWTH 1 DAY Performed at [facility], [place] [address], [place], [place] [address]    Report Status PENDING  Incomplete  Aerobic/Anaerobic Culture w Gram Stain (surgical/deep wound)     Status: None (Preliminary result)   Collection Time: 08/10/20  1:30 PM   Specimen: Pleura  Result Value Ref Range Status   Specimen Description PLEURAL SITE DRAINAGE  Final   Special Requests NONE  Final   Gram Stain   Final    FEW WBC PRESENT,BOTH PMN AND MONONUCLEAR NO ORGANISMS SEEN    Culture   Final    FEW STAPHYLOCOCCUS AUREUS SUSCEPTIBILITIES TO FOLLOW Performed at [facility], [place] [address], [place], [place] [address]    Report Status PENDING  Incomplete         Radiology Studies: CT CHEST ABDOMEN PELVIS W CONTRAST  Result Date: 08/11/2020 CLINICAL DATA:  Empyema.  Pleural effusion. EXAM: CT CHEST, ABDOMEN, AND PELVIS WITH CONTRAST TECHNIQUE: Multidetector CT imaging of the chest, abdomen and pelvis was performed following the standard protocol during bolus administration of intravenous contrast. CONTRAST:  126mL OMNIPAQUE IOHEXOL 300 MG/ML  SOLN COMPARISON:  CT chest 08/09/2020 FINDINGS: CT CHEST FINDINGS Cardiovascular: No significant vascular findings. Normal heart size. No pericardial effusion. Mediastinum/Nodes:  Small lymph nodes in the thoracic inlet and anterior mediastinum measure less than 1 centimeter in diameter and are likely reactive. Small lymph nodes are also noted at level 1, 2, and 3 of the LEFT axilla, likely reactive. Patient has had previous RIGHT axillary node dissection. The visualized portion of the thyroid gland has a normal appearance. Esophagus is unremarkable. Lungs/Pleura: LEFT PleurX catheter has been removed. There is persistent pleural thickening and pleural rind with appearance similar to prior studies. Small locules of gas are identified within the pleural space at the LEFT lung base. There is increased ground-glass opacity within the RIGHT UPPER and LOWER lobes, with dense consolidation at the LEFT lung base, increased since prior study. Air bronchograms are seen throughout the LEFT LOWER  lobe and LOWER LEFT UPPER lobe. A 4 millimeter nodule is identified in the LATERAL aspect of the RIGHT UPPER lobe (image 62 of series 5). A nodule measures 8 millimeters within the posterior RIGHT LOWER lobe on image 99 of series 5. Musculoskeletal: No chest wall mass or suspicious bone lesions identified. CT ABDOMEN PELVIS FINDINGS Hepatobiliary: No focal liver abnormality is seen. No radiopaque gallstones, biliary dilatation, or pericholecystic inflammatory changes. Pancreas: Unremarkable. No pancreatic ductal dilatation or surrounding inflammatory changes. Spleen: Normal in size without focal abnormality. Adrenals/Urinary Tract: Adrenal glands are normal. No renal mass or hydronephrosis. Ureters are unremarkable. The bladder and visualized portion of the urethra are normal. Stomach/Bowel: Stomach is distended by a significant debris. Elevated LEFT hemidiaphragm. Small bowel loops are unremarkable. Moderate stool burden. No obstructing colonic mass. Normal appendix. Vascular/Lymphatic: No significant vascular findings are present. No enlarged abdominal or pelvic lymph nodes. Reproductive: Uterus contains  intrauterine device, in expected location. No adnexal mass. Small amount of free pelvic fluid is likely physiologic. Other: Body wall edema, LEFT greater than RIGHT. Musculoskeletal: Degenerative changes are identified at L5-S1. IMPRESSION: 1. Increased ground-glass opacity and consolidation in the LEFT lung, consistent with infectious process. 2. Persistent loculated pleural effusion and thickened pleural rind, consistent with empyema. 3. Stable RIGHT lung nodules, largest 8 millimeters. These warrant follow-up; metastatic disease cannot be excluded. 4. Probably reactive lymph nodes in the thoracic inlet, anterior mediastinum, and LEFT axilla. 5. Status post RIGHT axillary dissection. 6. Significant stool burden. 7. Body wall edema. Electronically Signed   By: Nolon Nations M.D.   On: 08/11/2020 17:06   DG CHEST PORT 1 VIEW  Result Date: 08/11/2020 CLINICAL DATA:  Shortness of breath.  Malignant pleural effusion. EXAM: PORTABLE CHEST 1 VIEW COMPARISON:  08/09/2020 FINDINGS: The LEFT pleural catheter has been removed. A LEFT pleural effusion appears relatively unchanged. Increasing LEFT mid and lower lung opacities noted which may represent atelectasis and/or infection. There is no evidence of pneumothorax. The LEFT lung is clear. No acute bony abnormalities are present. IMPRESSION: Increasing LEFT mid and lower lung opacities which may represent atelectasis and/or infection. LEFT pleural catheter removal without significant change of LEFT pleural effusion. Electronically Signed   By: Margarette Canada M.D.   On: 08/11/2020 11:27        Scheduled Meds:  docusate sodium  100 mg Oral BID   multivitamin with minerals  1 tablet Oral Daily   sodium chloride flush  10-40 mL Intracatheter Q12H   venlafaxine XR  75 mg Oral QHS   vitamin B-12  1,000 mcg Oral Daily   Continuous Infusions:  cefTRIAXone (ROCEPHIN)  IV 2 g (08/11/20 1839)   vancomycin 1,250 mg (08/12/20 0905)     LOS: 3 days    Time spent:  39 minutes spent on chart review, discussion with nursing staff, consultants, updating family and interview/physical exam; more than 50% of that time was spent in counseling and/or coordination of care.    Vaunda Gutterman J [place] DO [facility] Available via Epic secure chat 7am-7pm After these hours, please refer to coverage provider listed on [email or web address] 08/12/2020, 11:18 AM

## 2020-08-12 NOTE — Progress Notes (Addendum)
      [address]       [place], [address]             [phone]         Subjective: Awake and alert, her parents are at the bedside.  Says she feels about the same today with soreness along the PleurX catheter tunnel. Also having some itching.  No fever over night. No BM yet.  Objective: Vital signs in last 24 hours: Temp:  [98.1 F (36.7 C)-101.7 F (38.7 C)] 98.7 F (37.1 C) (08/08 0815) Pulse Rate:  [87-110] 96 (08/08 0815) Cardiac Rhythm: Sinus tachycardia (08/08 0746) Resp:  [15-20] 18 (08/08 0815) BP: (114-142)/(59-78) 120/59 (08/08 0815) SpO2:  [93 %-100 %] 93 % (08/08 0815)    Intake/Output from previous day: No intake/output data recorded. Intake/Output this shift: No intake/output data recorded.  General appearance: Drowsy after taking some pain medication. Neurologic: intact Heart: RRR Lungs: having a coarse, non-productive cough.  Abdomen: soft, NT. Wound: has erythema and induration along the PleurX tunnel tract. A dressing with some staining covers the exit site.   Lab Results: Recent Labs    08/11/20 0430 08/12/20 0315  WBC 17.0* 12.9*  HGB 8.6* 9.1*  HCT 26.5* 28.2*  PLT 368 412*   BMET:  Recent Labs    08/11/20 0430 08/12/20 0315  NA 138 135  K 4.1 4.8  CL 104 102  CO2 27 23  GLUCOSE 99 110*  BUN 5* 6  CREATININE 0.51 0.44  CALCIUM 8.5* 8.3*    PT/INR:  Recent Labs    08/10/20 0130  LABPROT 14.5  INR 1.1   ABG    Component Value Date/Time   PHART 7.435 07/25/2020 0953   HCO3 26.1 07/25/2020 0953   O2SAT 97.0 07/25/2020 0953   CBG (last 3)  No results for input(s): GLUCAP in the last 72 hours.  Assessment/Plan:  -Soft tissue abscess along the PleurX catheter tract. Catheter removed on 8/6 and wound culture obtained at that time is showing S. Aureus, susceptibilities are pending. She is currently on Vanc and Rocephin and WBC is trending down.   Will plan for incision and drainage of the chest wall  abscess and likely wound vac placement later today in the OR. This was explained to Ms. Wendel and her parents. She will need to be NPO for this procedure.   CT of chest and abd reviewed with Dr. Kipp Brood. There is no indication for surgical intervention for the left lung consolidation and pleural thickening as she is known to have metastatic adenocarcinoma to the pleura base on Bx obtained during VATS procedure on 07/29/20.  She has been evaluated by oncology and is scheduled to begin chemotherapy on Thursday of this week.   LOS: 3 days    Antony Odea, [place] [phone] 08/12/2020  Agree with above OR today for I&D  Lajuana Matte

## 2020-08-12 NOTE — Anesthesia Preprocedure Evaluation (Addendum)
Anesthesia Evaluation  Patient identified by MRN, date of birth, ID band Patient awake    Reviewed: Allergy & Precautions, NPO status , Patient's Chart, lab work & pertinent test results  Airway Mallampati: II  TM Distance: >3 FB Neck ROM: Full    Dental  (+) Dental Advisory Given, Teeth Intact   Pulmonary former smoker,    Pulmonary exam normal breath sounds clear to auscultation       Cardiovascular hypertension, Normal cardiovascular exam Rhythm:Regular Rate:Normal     Neuro/Psych PSYCHIATRIC DISORDERS Anxiety    GI/Hepatic Neg liver ROS,   Endo/Other  negative endocrine ROS  Renal/GU negative Renal ROS     Musculoskeletal   Abdominal   Peds  Hematology   Anesthesia Other Findings   Reproductive/Obstetrics                            Anesthesia Physical  Anesthesia Plan  ASA: 3  Anesthesia Plan: General   Post-op Pain Management:    Induction: Intravenous  PONV Risk Score and Plan: 4 or greater and Ondansetron, Dexamethasone, Midazolam and Treatment may vary due to age or medical condition  Airway Management Planned: Oral ETT and LMA  Additional Equipment: None  Intra-op Plan:   Post-operative Plan: Extubation in OR  Informed Consent: I have reviewed the patients History and Physical, chart, labs and discussed the procedure including the risks, benefits and alternatives for the proposed anesthesia with the patient or authorized representative who has indicated his/her understanding and acceptance.     Dental advisory given  Plan Discussed with: CRNA  Anesthesia Plan Comments:        Anesthesia Quick Evaluation

## 2020-08-12 NOTE — Progress Notes (Signed)
Dr Kipp Brood came in and talked to the pt. Pt had breakfast up to 0930 today. Will cancel surgery today. Report was given to [place] at [facility].

## 2020-08-13 ENCOUNTER — Inpatient Hospital Stay (HOSPITAL_COMMUNITY): Payer: BC Managed Care – PPO | Admitting: Certified Registered"

## 2020-08-13 ENCOUNTER — Encounter (HOSPITAL_COMMUNITY)
Admission: AD | Disposition: A | Discharge status: Home Health | Payer: Self-pay | Source: Other Acute Inpatient Hospital | Attending: Internal Medicine

## 2020-08-13 ENCOUNTER — Encounter (HOSPITAL_COMMUNITY): Payer: Self-pay | Admitting: Internal Medicine

## 2020-08-13 DIAGNOSIS — L02213 Cutaneous abscess of chest wall: Secondary | ICD-10-CM

## 2020-08-13 DIAGNOSIS — J910 Malignant pleural effusion: Secondary | ICD-10-CM

## 2020-08-13 DIAGNOSIS — I9789 Other postprocedural complications and disorders of the circulatory system, not elsewhere classified: Secondary | ICD-10-CM

## 2020-08-13 HISTORY — PX: IRRIGATION AND DEBRIDEMENT STERNOCLAVICULAR JOINT-STERNUM AND RIBS: SHX6785

## 2020-08-13 LAB — CBC WITH DIFFERENTIAL/PLATELET
Abs Immature Granulocytes: 0.06 10*3/uL (ref 0.00–0.07)
Basophils Absolute: 0.1 10*3/uL (ref 0.0–0.1)
Basophils Relative: 1 %
Eosinophils Absolute: 0.4 10*3/uL (ref 0.0–0.5)
Eosinophils Relative: 4 %
HCT: 27 % — ABNORMAL LOW (ref 36.0–46.0)
Hemoglobin: 8.6 g/dL — ABNORMAL LOW (ref 12.0–15.0)
Immature Granulocytes: 1 %
Lymphocytes Relative: 15 %
Lymphs Abs: 1.4 10*3/uL (ref 0.7–4.0)
MCH: 30.4 pg (ref 26.0–34.0)
MCHC: 31.9 g/dL (ref 30.0–36.0)
MCV: 95.4 fL (ref 80.0–100.0)
Monocytes Absolute: 1 10*3/uL (ref 0.1–1.0)
Monocytes Relative: 11 %
Neutro Abs: 6.4 10*3/uL (ref 1.7–7.7)
Neutrophils Relative %: 68 %
Platelets: 429 10*3/uL — ABNORMAL HIGH (ref 150–400)
RBC: 2.83 MIL/uL — ABNORMAL LOW (ref 3.87–5.11)
RDW: 12.4 % (ref 11.5–15.5)
WBC: 9.2 10*3/uL (ref 4.0–10.5)
nRBC: 0 % (ref 0.0–0.2)

## 2020-08-13 LAB — BASIC METABOLIC PANEL
Anion gap: 5 (ref 5–15)
BUN: 5 mg/dL — ABNORMAL LOW (ref 6–20)
CO2: 32 mmol/L (ref 22–32)
Calcium: 8.7 mg/dL — ABNORMAL LOW (ref 8.9–10.3)
Chloride: 103 mmol/L (ref 98–111)
Creatinine, Ser: 0.5 mg/dL (ref 0.44–1.00)
GFR, Estimated: >60 mL/min (ref >60)
Glucose, Bld: 110 mg/dL — ABNORMAL HIGH (ref 70–99)
Potassium: 3.1 mmol/L — ABNORMAL LOW (ref 3.5–5.1)
Sodium: 140 mmol/L (ref 135–145)

## 2020-08-13 SURGERY — IRRIGATION AND DEBRIDEMENT OF STERNOCLAVICULAR JOINT-STERNUM AND RIBS
Anesthesia: General

## 2020-08-13 MED ORDER — LIDOCAINE HCL (PF) 1 % IJ SOLN
INTRAMUSCULAR | Status: DC | PRN
  Administered 2020-08-13: 10 mL

## 2020-08-13 MED ORDER — PROMETHAZINE HCL 25 MG/ML IJ SOLN: 6.2500 mg | INTRAMUSCULAR | Status: DC | PRN

## 2020-08-13 MED ORDER — ACETAMINOPHEN 10 MG/ML IV SOLN
INTRAVENOUS | Status: AC
  Filled 2020-08-13: qty 100

## 2020-08-13 MED ORDER — 0.9 % SODIUM CHLORIDE (POUR BTL) OPTIME
TOPICAL | Status: DC | PRN
  Administered 2020-08-13: 1000 mL

## 2020-08-13 MED ORDER — LIDOCAINE 2% (20 MG/ML) 5 ML SYRINGE
INTRAMUSCULAR | Status: DC | PRN
Start: 2020-08-13 — End: 2020-08-13
  Administered 2020-08-13: 100 mg via INTRAVENOUS

## 2020-08-13 MED ORDER — ACETAMINOPHEN 10 MG/ML IV SOLN
INTRAVENOUS | Status: DC | PRN
  Administered 2020-08-13: 1000 mg via INTRAVENOUS

## 2020-08-13 MED ORDER — FENTANYL CITRATE (PF) 250 MCG/5ML IJ SOLN
INTRAMUSCULAR | Status: AC
  Filled 2020-08-13: qty 5

## 2020-08-13 MED ORDER — POTASSIUM CHLORIDE CRYS ER 20 MEQ PO TBCR
40.0000 meq | EXTENDED_RELEASE_TABLET | Freq: Once | ORAL | Status: AC
  Administered 2020-08-13: 40 meq via ORAL
  Filled 2020-08-13: qty 2

## 2020-08-13 MED ORDER — PHENYLEPHRINE HCL (PRESSORS) 10 MG/ML IV SOLN
INTRAVENOUS | Status: DC | PRN
  Administered 2020-08-13: 80 ug via INTRAVENOUS

## 2020-08-13 MED ORDER — PROPOFOL 10 MG/ML IV BOLUS
INTRAVENOUS | Status: AC
  Filled 2020-08-13: qty 20

## 2020-08-13 MED ORDER — MIDAZOLAM HCL 2 MG/2ML IJ SOLN
INTRAMUSCULAR | Status: AC
  Filled 2020-08-13: qty 2

## 2020-08-13 MED ORDER — PROPOFOL 10 MG/ML IV BOLUS
INTRAVENOUS | Status: DC | PRN
  Administered 2020-08-13: 200 mg via INTRAVENOUS

## 2020-08-13 MED ORDER — LACTATED RINGERS IV SOLN: INTRAVENOUS | Status: DC | PRN

## 2020-08-13 MED ORDER — PHENYLEPHRINE 40 MCG/ML (10ML) SYRINGE FOR IV PUSH (FOR BLOOD PRESSURE SUPPORT)
PREFILLED_SYRINGE | INTRAVENOUS | Status: AC
  Filled 2020-08-13: qty 10

## 2020-08-13 MED ORDER — ONDANSETRON HCL 4 MG/2ML IJ SOLN
INTRAMUSCULAR | Status: AC
  Filled 2020-08-13: qty 2

## 2020-08-13 MED ORDER — ONDANSETRON HCL 4 MG/2ML IJ SOLN
INTRAMUSCULAR | Status: DC | PRN
  Administered 2020-08-13: 4 mg via INTRAVENOUS

## 2020-08-13 MED ORDER — MIDAZOLAM HCL 2 MG/2ML IJ SOLN
INTRAMUSCULAR | Status: DC | PRN
Start: 2020-08-13 — End: 2020-08-13
  Administered 2020-08-13: 2 mg via INTRAVENOUS

## 2020-08-13 MED ORDER — MEPERIDINE HCL 25 MG/ML IJ SOLN: 6.2500 mg | INTRAMUSCULAR | Status: DC | PRN

## 2020-08-13 MED ORDER — DEXAMETHASONE SODIUM PHOSPHATE 10 MG/ML IJ SOLN
INTRAMUSCULAR | Status: DC | PRN
  Administered 2020-08-13: 8 mg via INTRAVENOUS

## 2020-08-13 MED ORDER — HYDROMORPHONE HCL 1 MG/ML IJ SOLN: 0.2500 mg | INTRAMUSCULAR | Status: DC | PRN

## 2020-08-13 MED ORDER — FENTANYL CITRATE (PF) 250 MCG/5ML IJ SOLN
INTRAMUSCULAR | Status: DC | PRN
  Administered 2020-08-13: 50 ug via INTRAVENOUS

## 2020-08-13 MED ORDER — LIDOCAINE HCL (PF) 1 % IJ SOLN
INTRAMUSCULAR | Status: AC
  Filled 2020-08-13: qty 30

## 2020-08-13 SURGICAL SUPPLY — 56 items
APL PRP STRL LF DISP 70% ISPRP (MISCELLANEOUS) ×1
APL SKNCLS STERI-STRIP NONHPOA (GAUZE/BANDAGES/DRESSINGS)
ATTRACTOMAT 16X20 MAGNETIC DRP (DRAPES) IMPLANT
BAG DECANTER FOR FLEXI CONT (MISCELLANEOUS) IMPLANT
BENZOIN TINCTURE PRP APPL 2/3 (GAUZE/BANDAGES/DRESSINGS) IMPLANT
BLADE CLIPPER SURG (BLADE) IMPLANT
BLADE SURG 10 STRL SS (BLADE) ×2 IMPLANT
BNDG GAUZE ELAST 4 BULKY (GAUZE/BANDAGES/DRESSINGS) IMPLANT
CANISTER SUCT 3000ML PPV (MISCELLANEOUS) ×2 IMPLANT
CANISTER WOUND CARE 500ML ATS (WOUND CARE) ×2 IMPLANT
CATH FOLEY 2WAY SLVR  5CC 16FR (CATHETERS)
CATH FOLEY 2WAY SLVR 5CC 16FR (CATHETERS) IMPLANT
CHLORAPREP W/TINT 26 (MISCELLANEOUS) ×2 IMPLANT
CLIP VESOCCLUDE SM WIDE 24/CT (CLIP) IMPLANT
CNTNR URN SCR LID CUP LEK RST (MISCELLANEOUS) IMPLANT
CONT SPEC 4OZ STRL OR WHT (MISCELLANEOUS)
COVER SURGICAL LIGHT HANDLE (MISCELLANEOUS) ×2 IMPLANT
DRAPE LAPAROSCOPIC ABDOMINAL (DRAPES) IMPLANT
DRAPE WARM FLUID 44X44 (DRAPES) IMPLANT
DRSG AQUACEL AG ADV 3.5X14 (GAUZE/BANDAGES/DRESSINGS) IMPLANT
DRSG VAC ATS LRG SENSATRAC (GAUZE/BANDAGES/DRESSINGS) IMPLANT
DRSG VAC ATS MED SENSATRAC (GAUZE/BANDAGES/DRESSINGS) IMPLANT
DRSG VAC ATS SM SENSATRAC (GAUZE/BANDAGES/DRESSINGS) IMPLANT
ELECT REM PT RETURN 9FT ADLT (ELECTROSURGICAL) ×2
ELECTRODE REM PT RTRN 9FT ADLT (ELECTROSURGICAL) ×1 IMPLANT
GAUZE PACKING IODOFORM 1/4X15 (PACKING) ×2 IMPLANT
GAUZE SPONGE 4X4 12PLY STRL (GAUZE/BANDAGES/DRESSINGS) ×2 IMPLANT
GAUZE XEROFORM 5X9 LF (GAUZE/BANDAGES/DRESSINGS) IMPLANT
GLOVE SURG ENC MOIS LTX SZ7 (GLOVE) ×2 IMPLANT
GLOVE SURG ENC MOIS LTX SZ7.5 (GLOVE) ×2 IMPLANT
GOWN STRL REUS W/ TWL LRG LVL3 (GOWN DISPOSABLE) ×2 IMPLANT
GOWN STRL REUS W/ TWL XL LVL3 (GOWN DISPOSABLE) ×1 IMPLANT
GOWN STRL REUS W/TWL LRG LVL3 (GOWN DISPOSABLE) ×4
GOWN STRL REUS W/TWL XL LVL3 (GOWN DISPOSABLE) ×2
HANDPIECE INTERPULSE COAX TIP (DISPOSABLE)
HEMOSTAT POWDER SURGIFOAM 1G (HEMOSTASIS) IMPLANT
HEMOSTAT SURGICEL 2X14 (HEMOSTASIS) IMPLANT
KIT BASIN OR (CUSTOM PROCEDURE TRAY) ×2 IMPLANT
KIT TURNOVER KIT B (KITS) ×2 IMPLANT
NS IRRIG 1000ML POUR BTL (IV SOLUTION) ×2 IMPLANT
PACK GENERAL/GYN (CUSTOM PROCEDURE TRAY) ×2 IMPLANT
PAD ARMBOARD 7.5X6 YLW CONV (MISCELLANEOUS) ×4 IMPLANT
SET HNDPC FAN SPRY TIP SCT (DISPOSABLE) IMPLANT
SOL PREP POV-IOD 4OZ 10% (MISCELLANEOUS) IMPLANT
SPONGE T-LAP 18X18 ~~LOC~~+RFID (SPONGE) ×2 IMPLANT
STAPLER VISISTAT 35W (STAPLE) IMPLANT
SUT VIC AB 1 CTX 36 (SUTURE)
SUT VIC AB 1 CTX36XBRD ANBCTR (SUTURE) IMPLANT
SUT VIC AB 2-0 CTX 27 (SUTURE) IMPLANT
SUT VIC AB 3-0 X1 27 (SUTURE) IMPLANT
SWAB COLLECTION DEVICE MRSA (MISCELLANEOUS) IMPLANT
SWAB CULTURE ESWAB REG 1ML (MISCELLANEOUS) IMPLANT
SYR 5ML LL (SYRINGE) IMPLANT
TAPE CLOTH SURG 4X10 WHT LF (GAUZE/BANDAGES/DRESSINGS) ×2 IMPLANT
TOWEL GREEN STERILE (TOWEL DISPOSABLE) ×2 IMPLANT
WATER STERILE IRR 1000ML POUR (IV SOLUTION) ×2 IMPLANT

## 2020-08-13 NOTE — Progress Notes (Signed)
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 Kipp Brood on 7/25 who initially presented to [facility] ED on 8/5 with subsequent transfer to [facility] hospital for fever, weakness/malaise, increased pain and redness at Pleurx catheter site.  Patient reports minimal output from Pleurx catheter.  In the ED,Temperature 100.6 F, HR 117, RR 21, BP 104/55, SPO2 98% on room air.  Sodium 132, potassium 3.3, chloride 100, CO2 23, glucose 226, BUN 9, creatinine 0.63.  Lactic acid 2.5.  WBC 27.2, hemoglobin 11.9, platelets 479.  COVID-19 PCR negative.  Influenza A/B PCR negative.  Urinalysis unrevealing.  Chest x-ray with slightly improved aeration left base compared to prior radiograph; no significant change left pleural effusion/disease.  CT chest with contrast with Pleurx catheter noted in place on left with loculated left hydropneumothorax, continued consolidation lingula and left lower lobe concerning for pneumonia, stranding subcutaneous soft tissues lateral left lower chest wall and upper abdominal wall/flank compatible with cellulitis.  Blood cultures x2 obtained.  Patient was started on empiric antibiotics with vancomycin and ceftriaxone.  Transferred from [facility] for CTS evaluation.  TRH consulted for admission.   Assessment & Plan:   Principal Problem:   Sepsis ([place]) Active Problems:   ADD (attention deficit disorder)   Malignant neoplasm of upper-outer quadrant of right breast in female, estrogen receptor positive (HCC)   Pleural effusion on left   Severe sepsis,  POA Loculated hydropneumothorax concerning for empyema, POA Left chest wall cellulitis, POA Lactic acidosis: Resolved Patient presenting as a transfer from [facility] with fever, weakness/malaise, shortness of breath.  CTchest with contrast with Pleurx catheter noted in place on left with loculated left hydropneumothorax, continued consolidation lingula and left lower lobe concerning for pneumonia, stranding subcutaneous soft tissues lateral left lower chest wall and upper abdominal wall/flank compatible with cellulitis.  Recent VATS/Pleurx placement by CTS Dr. Kipp Brood on 7/25.  Patient reports minimal drainage from catheter since placement.  Patient with fever 100.6, WBC count 27.2, lactic acid 2.5, and procalcitonin 0.24 on admission.  CT chest/abdomen/pelvis with contrast 8/7 with increased GGO/consolidation left lung, persistent loculated pleural effusion and thickened pleural rind consistent with empyema.  Patient underwent incision and drainage of the chest wall abscess on 8/9 by Dr. Kipp Brood. --CTS, Dr. Kipp Brood following --WBC 27.2>24.5>17.0>12.9>9.2 --Lactic acid 2.5>1.8>1.0 --Procalcitonin 0.24>0.16>0.70 --Blood cultures x2 8/6: no growth x 3 day --wound culture from left pleurex site: Staph aureus, resistant to tetracyclines --Ceftriaxone 2 g IV q24h --Supportive care, monitor fever curve, antipyretics as needed --CBC daily  Stage IV ER/PR positive, HER2 negative breast cancer with metastasis to lung pleura Patient follows with medical oncology outpatient, Dr. Grayland Ormond at [facility].  Recent VATS/Pleurx catheter placement by CTS, Dr. Kipp Brood on 7/25.  Initially diagnosed in 2020 and underwent chemotherapy, radiation and lumpectomy now with recurrence.  Planning to start Lupron, aromatase inhibitor and Ibrance. --Continue outpatient follow-up with oncology  Anxiety/Depression:  --Effexor 68m PO daily --xanax 0.518mTID prn  ADD: holding home Adderall  Constipation: --Colace 100 mg p.o.  twice daily --Bisacodyl 5 mg p.o. daily as needed moderate constipation --Lactulose 20 g p.o. as  needed severe constipation  DVT prophylaxis: SCDs Start: 08/10/20 0009   Code Status: Full Code Family Communication: No family present at bedside this morning  Disposition Plan:  Level of care: Progressive Status is: Inpatient  Remains inpatient appropriate because:Ongoing diagnostic testing needed not appropriate for outpatient work up, Unsafe d/c plan, IV treatments appropriate due to intensity of illness or inability to take PO, and Inpatient level of care appropriate due to severity of illness  Dispo: The patient is from: Home              Anticipated d/c is to: Home              Patient currently is not medically stable to d/c.   Difficult to place patient No  Consultants:  CTS, Dr. Lightfoot  Procedures:  Pleurx catheter removal by CTS 8/6  Antimicrobials:  Vancomycin 8/6>> Ceftriaxone 8/6>>   Subjective: Patient seen examined at bedside, resting comfortably.  Just returned from the operating room following incision and drainage of chest wall abscess.  Feels much better this morning.  No family present at bedside.  T-max past 24 hours 101.  Leukocytosis now resolved. No other questions or concerns at this time.  Denies headache, no visual changes, no chest pain, no palpitations, no abdominal pain, no cough/congestion.  No acute concerns overnight per nursing staff.  Objective: Vitals:   08/13/20 0832 08/13/20 0836 08/13/20 0849 08/13/20 0901  BP:  124/73 130/77 140/89  Pulse:  (!) 102 99 (!) 102  Resp:  20 15 18  Temp:   (!) 97.2 F (36.2 C) 98.4 F (36.9 C)  TempSrc:    Oral  SpO2: 95% 96% 93% 95%  Weight:      Height:        Intake/Output Summary (Last 24 hours) at 08/13/2020 1133 Last data filed at 08/13/2020 0806 Gross per 24 hour  Intake 1175 ml  Output 2 ml  Net 1173 ml    Filed Weights   08/10/20 0002  Weight: 79.5 kg    Examination:  General exam:  Appears calm and comfortable Respiratory system: Clear to auscultation. Respiratory effort normal.  On room air Cardiovascular system: S1 & S2 heard, RRR. No JVD, murmurs, rubs, gallops or clicks. No pedal edema.  Left chest wall with edema/erythema surrounding previous Pleurx catheter site with tenderness to palpation Gastrointestinal system: Abdomen is nondistended, soft and nontender. No organomegaly or masses felt. Normal bowel sounds heard. Central nervous system: Alert and oriented. No focal neurological deficits. Extremities: Symmetric 5 x 5 power. Skin: No rashes, lesions or ulcers Psychiatry: Judgement and insight appear normal.  Depressed mood & flat affect.     Data Reviewed: I have personally reviewed following labs and imaging studies  CBC: Recent Labs  Lab 08/09/20 1748 08/10/20 0130 08/11/20 0430 08/12/20 0315 08/13/20 0130  WBC 27.2* 24.5* 17.0* 12.9* 9.2  NEUTROABS 25.0*  --  14.0* 10.1* 6.4  HGB 11.9* 10.5* 8.6* 9.1* 8.6*  HCT 35.1* 31.3* 26.5* 28.2* 27.0*  MCV 92.9 91.8 95.3 94.6 95.4  PLT 479* 419* 368 412* 429*   Basic Metabolic Panel: Recent Labs  Lab 08/09/20 1748 08/10/20 0130 08/11/20 0430 08/12/20 0315 08/13/20 0130  NA 132* 137 138 135 140  K 3.3* 3.7 4.1 4.8 3.1*  CL 100 103 104 102 103  CO2 23 26 27 23 32  GLUCOSE 226* 149* 99 110* 110*  BUN 9 6 5* 6 5*  CREATININE 0.63 0.56 0.51 0.44 0.50  CALCIUM 9.0   8.8* 8.5* 8.3* 8.7*   GFR: Estimated Creatinine Clearance: 94.4 mL/min (by C-G formula based on SCr of 0.5 mg/dL). Liver Function Tests: Recent Labs  Lab 08/09/20 1748 08/10/20 0130  AST 27 15  ALT 14 11  ALKPHOS 54 49  BILITOT 0.6 0.3  PROT 7.4 6.2*  ALBUMIN 3.4* 2.5*   No results for input(s): LIPASE, AMYLASE in the last 168 hours. No results for input(s): AMMONIA in the last 168 hours. Coagulation Profile: Recent Labs  Lab 08/10/20 0130  INR 1.1   Cardiac Enzymes: No results for input(s): CKTOTAL, CKMB, CKMBINDEX,  TROPONINI in the last 168 hours. BNP (last 3 results) No results for input(s): PROBNP in the last 8760 hours. HbA1C: No results for input(s): HGBA1C in the last 72 hours. CBG: No results for input(s): GLUCAP in the last 168 hours. Lipid Profile: No results for input(s): CHOL, HDL, LDLCALC, TRIG, CHOLHDL, LDLDIRECT in the last 72 hours. Thyroid Function Tests: No results for input(s): TSH, T4TOTAL, FREET4, T3FREE, THYROIDAB in the last 72 hours. Anemia Panel: No results for input(s): VITAMINB12, FOLATE, FERRITIN, TIBC, IRON, RETICCTPCT in the last 72 hours. Sepsis Labs: Recent Labs  Lab 08/09/20 1748 08/10/20 0130 08/10/20 0445 08/11/20 0430 08/12/20 0315  PROCALCITON  --  0.24  --  0.16 0.70  LATICACIDVEN 2.5* 1.8 1.0  --  1.8    Recent Results (from the past 240 hour(s))  Resp Panel by RT-PCR (Flu A&B, Covid) Nasopharyngeal Swab     Status: None   Collection Time: 08/09/20  6:37 PM   Specimen: Nasopharyngeal Swab; Nasopharyngeal(NP) swabs in vial transport medium  Result Value Ref Range Status   SARS Coronavirus 2 by RT PCR NEGATIVE NEGATIVE Final    Comment: (NOTE) SARS-CoV-2 target nucleic acids are NOT DETECTED.  The SARS-CoV-2 RNA is generally detectable in upper respiratory specimens during the acute phase of infection. The lowest concentration of SARS-CoV-2 viral copies this assay can detect is 138 copies/mL. A negative result does not preclude SARS-Cov-2 infection and should not be used as the sole basis for treatment or other patient management decisions. A negative result may occur with  improper specimen collection/handling, submission of specimen other than nasopharyngeal swab, presence of viral mutation(s) within the areas targeted by this assay, and inadequate number of viral copies(<138 copies/mL). A negative result must be combined with clinical observations, patient history, and epidemiological information. The expected result is Negative.  Fact Sheet  for Patients:  https://www.fda.gov/media/152166/download  Fact Sheet for Healthcare Providers:  https://www.fda.gov/media/152162/download  This test is no t yet approved or cleared by the [place] FDA and  has been authorized for detection and/or diagnosis of SARS-CoV-2 by FDA under an Emergency Use Authorization (EUA). This EUA will remain  in effect (meaning this test can be used) for the duration of the COVID-19 declaration under Section 564(b)(1) of the Act, 21 U.S.C.section 360bbb-3(b)(1), unless the authorization is terminated  or revoked sooner.       Influenza A by PCR NEGATIVE NEGATIVE Final   Influenza B by PCR NEGATIVE NEGATIVE Final    Comment: (NOTE) The Xpert Xpress SARS-CoV-2/FLU/RSV plus assay is intended as an aid in the diagnosis of influenza from Nasopharyngeal swab specimens and should not be used as a sole basis for treatment. Nasal washings and aspirates are unacceptable for Xpert Xpress SARS-CoV-2/FLU/RSV testing.  Fact Sheet for Patients: https://www.fda.gov/media/152166/download  Fact Sheet for Healthcare Providers: https://www.fda.gov/media/152162/download  This test is not yet approved or cleared by the [place] FDA and has   been authorized for detection and/or diagnosis of SARS-CoV-2 by FDA under an Emergency Use Authorization (EUA). This EUA will remain in effect (meaning this test can be used) for the duration of the COVID-19 declaration under Section 564(b)(1) of the Act, 21 U.S.C. section 360bbb-3(b)(1), unless the authorization is terminated or revoked.  Performed at [facility], [address] [place], [place] [address]   Culture, blood (routine x 2)     Status: None (Preliminary result)   Collection Time: 08/10/20 12:40 AM   Specimen: BLOOD LEFT HAND  Result Value Ref Range Status   Specimen Description BLOOD LEFT HAND  Final   Special Requests   Final    BOTTLES DRAWN AEROBIC AND ANAEROBIC Blood Culture results  may not be optimal due to an inadequate volume of blood received in culture bottles   Culture   Final    NO GROWTH 3 DAYS Performed at [facility], [address] [place], [place] [address]    Report Status PENDING  Incomplete  Culture, blood (routine x 2)     Status: None (Preliminary result)   Collection Time: 08/10/20  1:20 AM   Specimen: BLOOD LEFT HAND  Result Value Ref Range Status   Specimen Description BLOOD LEFT HAND  Final   Special Requests   Final    BOTTLES DRAWN AEROBIC ONLY Blood Culture results may not be optimal due to an inadequate volume of blood received in culture bottles   Culture   Final    NO GROWTH 3 DAYS Performed at [facility], [address] [place], [place] [address]    Report Status PENDING  Incomplete  Aerobic/Anaerobic Culture w Gram Stain (surgical/deep wound)     Status: None (Preliminary result)   Collection Time: 08/10/20  1:30 PM   Specimen: Pleura  Result Value Ref Range Status   Specimen Description PLEURAL SITE DRAINAGE  Final   Special Requests NONE  Final   Gram Stain   Final    FEW WBC PRESENT,BOTH PMN AND MONONUCLEAR NO ORGANISMS SEEN Performed at [facility], [address] [place], [place] [address]    Culture   Final    FEW STAPHYLOCOCCUS AUREUS NO ANAEROBES ISOLATED; CULTURE IN PROGRESS FOR 5 DAYS    Report Status PENDING  Incomplete   Organism ID, Bacteria STAPHYLOCOCCUS AUREUS  Final      Susceptibility   Staphylococcus aureus - MIC*    CIPROFLOXACIN <=0.5 SENSITIVE Sensitive     ERYTHROMYCIN <=0.25 SENSITIVE Sensitive     GENTAMICIN <=0.5 SENSITIVE Sensitive     OXACILLIN 0.5 SENSITIVE Sensitive     TETRACYCLINE >=16 RESISTANT Resistant     VANCOMYCIN 1 SENSITIVE Sensitive     TRIMETH/SULFA <=10 SENSITIVE Sensitive     CLINDAMYCIN <=0.25 SENSITIVE Sensitive     RIFAMPIN <=0.5 SENSITIVE Sensitive     Inducible Clindamycin NEGATIVE Sensitive     * FEW STAPHYLOCOCCUS AUREUS         Radiology  Studies: CT CHEST ABDOMEN PELVIS W CONTRAST  Result Date: 08/11/2020 CLINICAL DATA:  Empyema.  Pleural effusion. EXAM: CT CHEST, ABDOMEN, AND PELVIS WITH CONTRAST TECHNIQUE: Multidetector CT imaging of the chest, abdomen and pelvis was performed following the standard protocol during bolus administration of intravenous contrast. CONTRAST:  100mL OMNIPAQUE IOHEXOL 300 MG/ML  SOLN COMPARISON:  CT chest 08/09/2020 FINDINGS: CT CHEST FINDINGS Cardiovascular: No significant vascular findings. Normal heart size. No pericardial effusion. Mediastinum/Nodes: Small lymph nodes in the thoracic inlet   and anterior mediastinum measure less than 1 centimeter in diameter and are likely reactive. Small lymph nodes are also noted at level 1, 2, and 3 of the LEFT axilla, likely reactive. Patient has had previous RIGHT axillary node dissection. The visualized portion of the thyroid gland has a normal appearance. Esophagus is unremarkable. Lungs/Pleura: LEFT PleurX catheter has been removed. There is persistent pleural thickening and pleural rind with appearance similar to prior studies. Small locules of gas are identified within the pleural space at the LEFT lung base. There is increased ground-glass opacity within the RIGHT UPPER and LOWER lobes, with dense consolidation at the LEFT lung base, increased since prior study. Air bronchograms are seen throughout the LEFT LOWER lobe and LOWER LEFT UPPER lobe. A 4 millimeter nodule is identified in the LATERAL aspect of the RIGHT UPPER lobe (image 62 of series 5). A nodule measures 8 millimeters within the posterior RIGHT LOWER lobe on image 99 of series 5. Musculoskeletal: No chest wall mass or suspicious bone lesions identified. CT ABDOMEN PELVIS FINDINGS Hepatobiliary: No focal liver abnormality is seen. No radiopaque gallstones, biliary dilatation, or pericholecystic inflammatory changes. Pancreas: Unremarkable. No pancreatic ductal dilatation or surrounding inflammatory changes.  Spleen: Normal in size without focal abnormality. Adrenals/Urinary Tract: Adrenal glands are normal. No renal mass or hydronephrosis. Ureters are unremarkable. The bladder and visualized portion of the urethra are normal. Stomach/Bowel: Stomach is distended by a significant debris. Elevated LEFT hemidiaphragm. Small bowel loops are unremarkable. Moderate stool burden. No obstructing colonic mass. Normal appendix. Vascular/Lymphatic: No significant vascular findings are present. No enlarged abdominal or pelvic lymph nodes. Reproductive: Uterus contains intrauterine device, in expected location. No adnexal mass. Small amount of free pelvic fluid is likely physiologic. Other: Body wall edema, LEFT greater than RIGHT. Musculoskeletal: Degenerative changes are identified at L5-S1. IMPRESSION: 1. Increased ground-glass opacity and consolidation in the LEFT lung, consistent with infectious process. 2. Persistent loculated pleural effusion and thickened pleural rind, consistent with empyema. 3. Stable RIGHT lung nodules, largest 8 millimeters. These warrant follow-up; metastatic disease cannot be excluded. 4. Probably reactive lymph nodes in the thoracic inlet, anterior mediastinum, and LEFT axilla. 5. Status post RIGHT axillary dissection. 6. Significant stool burden. 7. Body wall edema. Electronically Signed   By: Nolon Nations M.D.   On: 08/11/2020 17:06        Scheduled Meds:  docusate sodium  100 mg Oral BID   hydrocortisone cream  1 application Topical BID   multivitamin with minerals  1 tablet Oral Daily   sodium chloride flush  10-40 mL Intracatheter Q12H   venlafaxine XR  75 mg Oral QHS   vitamin B-12  1,000 mcg Oral Daily   Continuous Infusions:  cefTRIAXone (ROCEPHIN)  IV 2 g (08/12/20 1822)     LOS: 4 days    Time spent: 39 minutes spent on chart review, discussion with nursing staff, consultants, updating family and interview/physical exam; more than 50% of that time was spent in  counseling and/or coordination of care.    Saurabh Hettich J [place] DO [facility] Available via Epic secure chat 7am-7pm After these hours, please refer to coverage provider listed on [email or web address] 08/13/2020, 11:33 AM

## 2020-08-13 NOTE — Progress Notes (Signed)
     [address]       [place] [address]             [phone]       No events Minimal redness and induration over chest wall  OR today with minimal drainage Packed with iodiform gauze Continue abx and wound care Dispo planning.  Malia Corsi Bary Leriche

## 2020-08-13 NOTE — Transfer of Care (Signed)
Immediate Anesthesia Transfer of Care Note  Patient: PAYSEN HAMILTON  Procedure(s) Performed: IRRIGATION AND DEBRIDEMENT CHEST WALL ABSCESS  Patient Location: PACU  Anesthesia Type:General  Level of Consciousness: awake, alert  and oriented  Airway & Oxygen Therapy: Patient Spontanous Breathing and Patient connected to nasal cannula oxygen  Post-op Assessment: Report given to RN and Post -op Vital signs reviewed and stable  Post vital signs: Reviewed and stable  Last Vitals:  Vitals Value Taken Time  BP 139/80 08/13/20 0821  Temp    Pulse 102 08/13/20 0825  Resp 19 08/13/20 0825  SpO2 96 % 08/13/20 0825  Vitals shown include unvalidated device data.  Last Pain:  Vitals:   08/13/20 0614  TempSrc:   PainSc: 1       Patients Stated Pain Goal: 2 ([id number])  Complications: No notable events documented.

## 2020-08-13 NOTE — Op Note (Signed)
     [address]       [place] [address]             [phone]       08/13/2020 Patient:  Kaylee Romero Pre-Op Dx: Chest wall infection   History of malignant pleural effusion   History of Pleurx catheter infection Post-op Dx: Same Procedure: -Incision and drainage of right chest abscess.  Surgeon and Role:      * Aubryana Vittorio, Lucile Crater, MD - Primary  Anesthesia  general EBL: 0 ml Blood Administration: None Specimen: None   Counts: correct    Indications: 40 year old female with history of breast cancer that was originally seen for malignant effusion on the left side.  She underwent a pleural biopsy with Pleurx catheter placement, but this had to be removed due to concerns for an infection.  She has been treated with antibiotics and has had some drainage from the Pleurx catheter site.  She was brought to the operating theater for incision and drainage.  Findings: The erythema and induration were much improved.  There is no area of fluctuance.  The wound is probed without release of any purulence.  It was simply packed with iodoform gauze.  Operative Technique: After the risks, benefits and alternatives were thoroughly discussed, the patient was brought to the operative theatre.  Anesthesia was induced, the patient was then placed in a lazy right lateral decubitus position and was prepped and draped in normal sterile fashion.  An appropriate surgical pause was performed, and pre-operative antibiotics were dosed accordingly.  The Pleurx catheter exit site was probed with a hemostat.  It tracked about 4 cm into the soft tissue.  There was no release of any purulence.  The wound was then irrigated, and packed with iodoform gauze.  The patient tolerated the procedure without any immediate complications, and was transferred to the PACU in stable condition.  Laporche Martelle Bary Leriche

## 2020-08-13 NOTE — Anesthesia Procedure Notes (Signed)
Procedure Name: LMA Insertion Date/Time: 08/13/2020 7:42 AM Performed by: Inda Coke, CRNA Pre-anesthesia Checklist: Patient identified, Emergency Drugs available, Suction available and Patient being monitored Patient Re-evaluated:Patient Re-evaluated prior to induction Oxygen Delivery Method: Circle System Utilized Preoxygenation: Pre-oxygenation with 100% oxygen Induction Type: IV induction Ventilation: Mask ventilation without difficulty LMA: LMA inserted LMA Size: 4.0 Number of attempts: 1 Placement Confirmation: positive ETCO2 Tube secured with: Tape Dental Injury: Teeth and Oropharynx as per pre-operative assessment

## 2020-08-14 ENCOUNTER — Encounter (HOSPITAL_COMMUNITY): Payer: Self-pay | Admitting: Thoracic Surgery (Cardiothoracic Vascular Surgery)

## 2020-08-14 LAB — CBC WITH DIFFERENTIAL/PLATELET
Abs Immature Granulocytes: 0.04 10*3/uL (ref 0.00–0.07)
Basophils Absolute: 0 10*3/uL (ref 0.0–0.1)
Basophils Relative: 0 %
Eosinophils Absolute: 0 10*3/uL (ref 0.0–0.5)
Eosinophils Relative: 0 %
HCT: 24.9 % — ABNORMAL LOW (ref 36.0–46.0)
Hemoglobin: 8.1 g/dL — ABNORMAL LOW (ref 12.0–15.0)
Immature Granulocytes: 0 %
Lymphocytes Relative: 11 %
Lymphs Abs: 1.4 10*3/uL (ref 0.7–4.0)
MCH: 30.2 pg (ref 26.0–34.0)
MCHC: 32.5 g/dL (ref 30.0–36.0)
MCV: 92.9 fL (ref 80.0–100.0)
Monocytes Absolute: 1 10*3/uL (ref 0.1–1.0)
Monocytes Relative: 8 %
Neutro Abs: 10.1 10*3/uL — ABNORMAL HIGH (ref 1.7–7.7)
Neutrophils Relative %: 81 %
Platelets: 458 10*3/uL — ABNORMAL HIGH (ref 150–400)
RBC: 2.68 MIL/uL — ABNORMAL LOW (ref 3.87–5.11)
RDW: 12.2 % (ref 11.5–15.5)
WBC: 12.5 10*3/uL — ABNORMAL HIGH (ref 4.0–10.5)
nRBC: 0 % (ref 0.0–0.2)

## 2020-08-14 LAB — BASIC METABOLIC PANEL
Anion gap: 9 (ref 5–15)
BUN: 5 mg/dL — ABNORMAL LOW (ref 6–20)
CO2: 26 mmol/L (ref 22–32)
Calcium: 8.7 mg/dL — ABNORMAL LOW (ref 8.9–10.3)
Chloride: 101 mmol/L (ref 98–111)
Creatinine, Ser: 0.51 mg/dL (ref 0.44–1.00)
GFR, Estimated: >60 mL/min (ref >60)
Glucose, Bld: 104 mg/dL — ABNORMAL HIGH (ref 70–99)
Potassium: 4 mmol/L (ref 3.5–5.1)
Sodium: 136 mmol/L (ref 135–145)

## 2020-08-14 LAB — MAGNESIUM: Magnesium: 1.9 mg/dL (ref 1.7–2.4)

## 2020-08-14 MED ORDER — BISACODYL 10 MG RE SUPP
10.0000 mg | Freq: Every day | RECTAL | Status: DC | PRN
  Administered 2020-08-15: 10 mg via RECTAL
  Filled 2020-08-14: qty 1

## 2020-08-14 MED ORDER — OXYCODONE HCL 5 MG PO TABS
5.0000 mg | ORAL_TABLET | ORAL | Status: DC | PRN
  Administered 2020-08-14: 5 mg via ORAL
  Filled 2020-08-14: qty 1

## 2020-08-14 MED ORDER — POLYETHYLENE GLYCOL 3350 17 G PO PACK
17.0000 g | PACK | Freq: Every day | ORAL | Status: DC
  Administered 2020-08-14: 17 g via ORAL
  Filled 2020-08-14: qty 1

## 2020-08-14 MED ORDER — SENNOSIDES-DOCUSATE SODIUM 8.6-50 MG PO TABS
1.0000 | ORAL_TABLET | Freq: Two times a day (BID) | ORAL | Status: DC
  Administered 2020-08-14 (×2): 1 via ORAL
  Filled 2020-08-14: qty 1

## 2020-08-14 MED ORDER — LETROZOLE 2.5 MG PO TABS
2.5000 mg | ORAL_TABLET | Freq: Every day | ORAL | Status: DC
  Administered 2020-08-14 – 2020-08-15 (×2): 2.5 mg via ORAL
  Filled 2020-08-14 (×3): qty 1

## 2020-08-14 NOTE — Progress Notes (Deleted)
      [address]       [place], [address]             [phone]      1 Day Post-Op Procedure(s) (LRB): IRRIGATION AND DEBRIDEMENT CHEST WALL ABSCESS (N/A) Subjective: Awake and alert, her mother is at the bedside.  Says she feels much better today. Denies pain.   Objective: Vital signs in last 24 hours: Temp:  [97.2 F (36.2 C)-98.9 F (37.2 C)] 98.3 F (36.8 C) (08/10 0420) Pulse Rate:  [86-113] 86 (08/10 0420) Cardiac Rhythm: Normal sinus rhythm (08/09 1905) Resp:  [15-24] 17 (08/10 0420) BP: (124-140)/(65-89) 127/65 (08/10 0420) SpO2:  [93 %-100 %] 100 % (08/10 0420) FiO2 (%):  [21 %] 21 % (08/09 0832)    Intake/Output from previous day: 08/09 0701 - 08/10 0700 In: 400 [I.V.:400] Out: 2 [Blood:2] Intake/Output this shift: No intake/output data recorded.  General appearance: awake and alert.  Neurologic: intact Heart: RRR, few pvc's Lungs: having a coarse, non-productive cough.  Abdomen: soft, NT. Wound: erythema and induration along the PleurX tunnel have resolved.   Lab Results: Recent Labs    08/13/20 0130 08/14/20 0420  WBC 9.2 12.5*  HGB 8.6* 8.1*  HCT 27.0* 24.9*  PLT 429* 458*    BMET:  Recent Labs    08/13/20 0130 08/14/20 0420  NA 140 136  K 3.1* 4.0  CL 103 101  CO2 32 26  GLUCOSE 110* 104*  BUN 5* 5*  CREATININE 0.50 0.51  CALCIUM 8.7* 8.7*     PT/INR:  No results for input(s): LABPROT, INR in the last 72 hours.  ABG    Component Value Date/Time   PHART 7.435 07/25/2020 0953   HCO3 26.1 07/25/2020 0953   O2SAT 97.0 07/25/2020 0953   CBG (last 3)  No results for input(s): GLUCAP in the last 72 hours.  Assessment/Plan:  -Soft tissue abscess along the PleurX catheter tract presenting with sepsis. Catheter removed on 8/6.  Wound culture shows MSSA. Erythema and induration have resolved, WBC trending down. OK to discharge to home today or oral ABX. Her family or HHRN to assist with daily iodoform wound  packing to PleurX exit site. Will arrange follow up with our office in about 1 week.    LOS: 5 days    Antony Odea, PA-C [phone] 08/14/2020

## 2020-08-14 NOTE — Progress Notes (Signed)
PROGRESS NOTE    [name]   EHO:[id number]  DOB: [birth year]  PCP: Dion Body, MD    DOA: 08/09/2020 LOS: 5   Assessment & Plan   Principal Problem:   Chest wall abscess Active Problems:   ADD (attention deficit disorder)   Malignant neoplasm of upper-outer quadrant of right breast in female, estrogen receptor positive (HCC)   Pleural effusion on left   Sepsis (HCC)   Severe sepsis, POA -as evidenced by fever, leukocytosis, lactic acidosis in the setting of cellulitis and probable empyema Loculated hydropneumothorax concerning for empyema, POA Left chest wall cellulitis, POA Lactic acidosis: Resolved --CT surgery, Dr. Kipp Brood is following --Leukocytosis improving, follow CBC --Follow blood cultures, negative to date --Wound culture from left Pleurx site grew staph aureus resistant to tetracyclines --Continue 2 g Rocephin daily --Supportive care: Antipyretics, monitor fever curve, pain control  Constipation -likely opioid induced, increased bowel regimen today including as needed suppository.  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 Grayland Ormond with oncology at [facility].  Recent VATS/Pleurx catheter placement on 7/25 with Dr. Kipp Brood.  Initially diagnosed with breast cancer in 2020, underwent chemo, radiation and lumpectomy.  Now with recurrence. --Plan is to start Lupron, aromatase inhibitor and Ibrance --Patient has appointment tomorrow afternoon with Dr. Grayland Ormond to start therapies  Anxiety/depression -continue home Effexor and as needed Xanax  Attention deficit disorder -home Adderall is on hold  Patient BMI: Body mass index is 30.07 kg/m.   DVT prophylaxis: SCDs Start: 08/10/20 0009   Diet:  Diet Orders (From admission, onward)     Start     Ordered   08/13/20 1153  Diet regular Room service appropriate? Yes; Fluid consistency: Thin  Diet effective now       Question Answer  Comment  Room service appropriate? Yes   Fluid consistency: Thin      08/13/20 1153              Code Status: Full Code   Brief Narrative / Hospital Course to Date:   "SHANDIIN Romero is a 40 year old female with past medical history significant for stage IV ER/PR positive, HER2 negative breast cancer with metastasis to lung pleura who initially underwent lumpectomy, chemotherapy, radiation in 2020 on tamoxifen now with recurrent and metastatic disease to the lung pleura s/p VATS and Pleurx drain placement by CTS, Dr. Kipp Brood on 7/25 who initially presented to [facility] ED on 8/5 with subsequent transfer to [facility] hospital for fever, weakness/malaise, increased pain and redness at Pleurx catheter site.  Patient reports minimal output from Pleurx catheter.   In the ED,Temperature 100.6 F, HR 117, RR 21, BP 104/55, SPO2 98% on room air.  Sodium 132, potassium 3.3, chloride 100, CO2 23, glucose 226, BUN 9, creatinine 0.63.  Lactic acid 2.5.  WBC 27.2, hemoglobin 11.9, platelets 479.  COVID-19 PCR negative.  Influenza A/B PCR negative.  Urinalysis unrevealing.  Chest x-ray with slightly improved aeration left base compared to prior radiograph; no significant change left pleural effusion/disease.  CT chest with contrast with Pleurx catheter noted in place on left with loculated left hydropneumothorax, continued consolidation lingula and left lower lobe concerning for pneumonia, stranding subcutaneous soft tissues lateral left lower chest wall and upper abdominal wall/flank compatible with cellulitis.  Blood cultures x2 obtained.  Patient was started on empiric antibiotics with vancomycin and ceftriaxone.  Transferred from [facility] for CTS evaluation.  TRH consulted for admission."  Subjective 08/14/20  Patient seen at bedside today, mother present at bedside.  Patient reports severe constipation, had a single small difficult to pass BM since admission.  Reports abdominal distention and  bloating/discomfort.  Expresses concern regarding delay of her cancer treatments which she supposed to start tomorrow in [place].   Disposition Plan & Communication   Status is: Inpatient  Remains inpatient appropriate because: Progressive anemia warranting monitoring to ensure stability over the next 24 hours; severe constipation  Dispo: The patient is from: Home              Anticipated d/c is to: Home              Patient currently is not medically stable to d/c.   Difficult to place patient No   Family Communication: Mother at bedside on rounds   Consults, Procedures, Significant Events   Consultants:  Cardiothoracic surgery -Dr. Kipp Brood  Procedures:  Pleurx catheter removal by CTS 8/6  Antimicrobials:  Anti-infectives (From admission, onward)    Start     Dose/Rate Route Frequency Ordered Stop   08/12/20 1830  fluconazole (DIFLUCAN) tablet 150 mg        150 mg Oral  Once 08/12/20 1735 08/12/20 1749   08/11/20 2200  vancomycin (VANCOREADY) IVPB 1250 mg/250 mL  Status:  Discontinued        1,250 mg 166.7 mL/hr over 90 Minutes Intravenous Every 12 hours 08/11/20 1447 08/12/20 1401   08/11/20 1830  cefTRIAXone (ROCEPHIN) 2 g in sodium chloride 0.9 % 100 mL IVPB  Status:  Discontinued        2 g 200 mL/hr over 30 Minutes Intravenous Every 24 hours 08/10/20 0001 08/10/20 1418   08/11/20 0200  vancomycin (VANCOREADY) IVPB 750 mg/150 mL        750 mg 150 mL/hr over 60 Minutes Intravenous Every 12 hours 08/10/20 1446 08/11/20 1449   08/10/20 1830  cefTRIAXone (ROCEPHIN) 2 g in sodium chloride 0.9 % 100 mL IVPB        2 g 200 mL/hr over 30 Minutes Intravenous Every 24 hours 08/10/20 1418     08/10/20 0200  vancomycin (VANCOREADY) IVPB 1750 mg/350 mL  Status:  Discontinued        1,750 mg 175 mL/hr over 120 Minutes Intravenous Every 12 hours 08/10/20 0011 08/10/20 1446         Micro    Objective   Vitals:   08/13/20 2117 08/14/20 0420 08/14/20 0815 08/14/20  1157  BP: 139/65 127/65 138/69 130/61  Pulse: 96 86 92 95  Resp: $Remo'19 17 18 20  'yNHFr$ Temp: 98.1 F (36.7 C) 98.3 F (36.8 C) 98.4 F (36.9 C) (!) 97.5 F (36.4 C)  TempSrc: Oral Oral Oral Oral  SpO2: 100% 100% 98% 96%  Weight:      Height:       No intake or output data in the 24 hours ending 08/14/20 1519 Filed Weights   08/10/20 0002  Weight: 79.5 kg    Physical Exam:  General exam: awake, alert, no acute distress HEENT: atraumatic, clear conjunctiva, anicteric sclera, moist mucus membranes, hearing grossly normal  Respiratory system: CTAB, no wheezes, rales or rhonchi, normal respiratory effort. Cardiovascular system: normal S1/S2, RR, no JVD, murmurs, rubs, gallops, no pedal edema.   Gastrointestinal system: soft, NT, ND, no HSM felt, +bowel sounds. Central nervous system: A&O x4. no gross focal neurologic deficits, normal speech Extremities: moves all, no edema, normal tone Skin: dry, intact, normal temperature, chest tube removal site  healing well without signs of surrounding infection Psychiatry: normal mood, congruent affect, judgement and insight appear normal  Labs   Data Reviewed: I have personally reviewed following labs and imaging studies  CBC: Recent Labs  Lab 08/09/20 1748 08/10/20 0130 08/11/20 0430 08/12/20 0315 08/13/20 0130 08/14/20 0420  WBC 27.2* 24.5* 17.0* 12.9* 9.2 12.5*  NEUTROABS 25.0*  --  14.0* 10.1* 6.4 10.1*  HGB 11.9* 10.5* 8.6* 9.1* 8.6* 8.1*  HCT 35.1* 31.3* 26.5* 28.2* 27.0* 24.9*  MCV 92.9 91.8 95.3 94.6 95.4 92.9  PLT 479* 419* 368 412* 429* 885*   Basic Metabolic Panel: Recent Labs  Lab 08/10/20 0130 08/11/20 0430 08/12/20 0315 08/13/20 0130 08/14/20 0420  NA 137 138 135 140 136  K 3.7 4.1 4.8 3.1* 4.0  CL 103 104 102 103 101  CO2 $Re'26 27 23 'jTU$ 32 26  GLUCOSE 149* 99 110* 110* 104*  BUN 6 5* 6 5* 5*  CREATININE 0.56 0.51 0.44 0.50 0.51  CALCIUM 8.8* 8.5* 8.3* 8.7* 8.7*  MG  --   --   --   --  1.9   GFR: Estimated  Creatinine Clearance: 94.4 mL/min (by C-G formula based on SCr of 0.51 mg/dL). Liver Function Tests: Recent Labs  Lab 08/09/20 1748 08/10/20 0130  AST 27 15  ALT 14 11  ALKPHOS 54 49  BILITOT 0.6 0.3  PROT 7.4 6.2*  ALBUMIN 3.4* 2.5*   No results for input(s): LIPASE, AMYLASE in the last 168 hours. No results for input(s): AMMONIA in the last 168 hours. Coagulation Profile: Recent Labs  Lab 08/10/20 0130  INR 1.1   Cardiac Enzymes: No results for input(s): CKTOTAL, CKMB, CKMBINDEX, TROPONINI in the last 168 hours. BNP (last 3 results) No results for input(s): PROBNP in the last 8760 hours. HbA1C: No results for input(s): HGBA1C in the last 72 hours. CBG: No results for input(s): GLUCAP in the last 168 hours. Lipid Profile: No results for input(s): CHOL, HDL, LDLCALC, TRIG, CHOLHDL, LDLDIRECT in the last 72 hours. Thyroid Function Tests: No results for input(s): TSH, T4TOTAL, FREET4, T3FREE, THYROIDAB in the last 72 hours. Anemia Panel: No results for input(s): VITAMINB12, FOLATE, FERRITIN, TIBC, IRON, RETICCTPCT in the last 72 hours. Sepsis Labs: Recent Labs  Lab 08/09/20 1748 08/10/20 0130 08/10/20 0445 08/11/20 0430 08/12/20 0315  PROCALCITON  --  0.24  --  0.16 0.70  LATICACIDVEN 2.5* 1.8 1.0  --  1.8    Recent Results (from the past 240 hour(s))  Resp Panel by RT-PCR (Flu A&B, Covid) Nasopharyngeal Swab     Status: None   Collection Time: 08/09/20  6:37 PM   Specimen: Nasopharyngeal Swab; Nasopharyngeal(NP) swabs in vial transport medium  Result Value Ref Range Status   SARS Coronavirus 2 by RT PCR NEGATIVE NEGATIVE Final    Comment: (NOTE) SARS-CoV-2 target nucleic acids are NOT DETECTED.  The SARS-CoV-2 RNA is generally detectable in upper respiratory specimens during the acute phase of infection. The lowest concentration of SARS-CoV-2 viral copies this assay can detect is 138 copies/mL. A negative result does not preclude SARS-Cov-2 infection and  should not be used as the sole basis for treatment or other patient management decisions. A negative result may occur with  improper specimen collection/handling, submission of specimen other than nasopharyngeal swab, presence of viral mutation(s) within the areas targeted by this assay, and inadequate number of viral copies(<138 copies/mL). A negative result must be combined with clinical observations, patient history, and epidemiological information. The expected result is  Negative.  Fact Sheet for Patients:  EntrepreneurPulse.com.au  Fact Sheet for Healthcare Providers:  IncredibleEmployment.be  This test is no t yet approved or cleared by the [place] FDA and  has been authorized for detection and/or diagnosis of SARS-CoV-2 by FDA under an Emergency Use Authorization (EUA). This EUA will remain  in effect (meaning this test can be used) for the duration of the COVID-19 declaration under Section 564(b)(1) of the Act, 21 U.S.C.section 360bbb-3(b)(1), unless the authorization is terminated  or revoked sooner.       Influenza A by PCR NEGATIVE NEGATIVE Final   Influenza B by PCR NEGATIVE NEGATIVE Final    Comment: (NOTE) The Xpert Xpress SARS-CoV-2/FLU/RSV plus assay is intended as an aid in the diagnosis of influenza from Nasopharyngeal swab specimens and should not be used as a sole basis for treatment. Nasal washings and aspirates are unacceptable for Xpert Xpress SARS-CoV-2/FLU/RSV testing.  Fact Sheet for Patients: EntrepreneurPulse.com.au  Fact Sheet for Healthcare Providers: IncredibleEmployment.be  This test is not yet approved or cleared by the [place] FDA and has been authorized for detection and/or diagnosis of SARS-CoV-2 by FDA under an Emergency Use Authorization (EUA). This EUA will remain in effect (meaning this test can be used) for the duration of the COVID-19 declaration  under Section 564(b)(1) of the Act, 21 U.S.C. section 360bbb-3(b)(1), unless the authorization is terminated or revoked.  Performed at [facility], [address] [place], [place] [address]   Culture, blood (routine x 2)     Status: None (Preliminary result)   Collection Time: 08/10/20 12:40 AM   Specimen: BLOOD LEFT HAND  Result Value Ref Range Status   Specimen Description BLOOD LEFT HAND  Final   Special Requests   Final    BOTTLES DRAWN AEROBIC AND ANAEROBIC Blood Culture results may not be optimal due to an inadequate volume of blood received in culture bottles   Culture   Final    NO GROWTH 4 DAYS Performed at [facility], [place] [address], [place], [place] [address]    Report Status PENDING  Incomplete  Culture, blood (routine x 2)     Status: None (Preliminary result)   Collection Time: 08/10/20  1:20 AM   Specimen: BLOOD LEFT HAND  Result Value Ref Range Status   Specimen Description BLOOD LEFT HAND  Final   Special Requests   Final    BOTTLES DRAWN AEROBIC ONLY Blood Culture results may not be optimal due to an inadequate volume of blood received in culture bottles   Culture   Final    NO GROWTH 4 DAYS Performed at [facility], [place] [address] [place], [place] [address]    Report Status PENDING  Incomplete  Aerobic/Anaerobic Culture w Gram Stain (surgical/deep wound)     Status: None (Preliminary result)   Collection Time: 08/10/20  1:30 PM   Specimen: Pleura  Result Value Ref Range Status   Specimen Description PLEURAL SITE DRAINAGE  Final   Special Requests NONE  Final   Gram Stain   Final    FEW WBC PRESENT,BOTH PMN AND MONONUCLEAR NO ORGANISMS SEEN Performed at [facility], [address] [place], [place] [address]    Culture   Final    FEW STAPHYLOCOCCUS AUREUS NO ANAEROBES ISOLATED; CULTURE IN PROGRESS FOR 5 DAYS    Report Status PENDING  Incomplete   Organism ID, Bacteria STAPHYLOCOCCUS AUREUS  Final      Susceptibility  Staphylococcus aureus - MIC*    CIPROFLOXACIN <=0.5 SENSITIVE Sensitive     ERYTHROMYCIN <=0.25 SENSITIVE Sensitive     GENTAMICIN <=0.5 SENSITIVE Sensitive     OXACILLIN 0.5 SENSITIVE Sensitive     TETRACYCLINE >=16 RESISTANT Resistant     VANCOMYCIN 1 SENSITIVE Sensitive     TRIMETH/SULFA <=10 SENSITIVE Sensitive     CLINDAMYCIN <=0.25 SENSITIVE Sensitive     RIFAMPIN <=0.5 SENSITIVE Sensitive     Inducible Clindamycin NEGATIVE Sensitive     * FEW STAPHYLOCOCCUS AUREUS      Imaging Studies   No results found.   Medications   Scheduled Meds:  hydrocortisone cream  1 application Topical BID   letrozole  2.5 mg Oral Daily   multivitamin with minerals  1 tablet Oral Daily   polyethylene glycol  17 g Oral Daily   senna-docusate  1 tablet Oral BID   sodium chloride flush  10-40 mL Intracatheter Q12H   venlafaxine XR  75 mg Oral QHS   vitamin B-12  1,000 mcg Oral Daily   Continuous Infusions:  cefTRIAXone (ROCEPHIN)  IV 2 g (08/13/20 1733)       LOS: 5 days    Time spent: 30 minutes    Ezekiel Slocumb, DO [facility]  08/14/2020, 3:19 PM      If 7PM-7AM, please contact night-coverage. How to contact the [facility] Attending or Consulting provider [place] or covering provider during after hours [place], for this patient?    Check the care team in [facility] and look for a) attending/consulting TR[facility] provider listed and b) the [facility] team listed Log into [email or web address] and use [place]'s universal password to access. If you do not have the password, please contact the hospital operator. Locate the [facility] provider you are looking for under [facility] and page to a number that you can be directly reached. If you still have difficulty reaching the provider, please page the [facility] (Director on Call) for the Hospitalists listed on amion for assistance.

## 2020-08-14 NOTE — Anesthesia Postprocedure Evaluation (Signed)
Anesthesia Post Note  Patient: Kaylee Romero  Procedure(s) Performed: IRRIGATION AND DEBRIDEMENT CHEST WALL ABSCESS     Patient location during evaluation: PACU Anesthesia Type: General Level of consciousness: sedated and patient cooperative Pain management: pain level controlled Vital Signs Assessment: post-procedure vital signs reviewed and stable Respiratory status: spontaneous breathing Cardiovascular status: stable Anesthetic complications: no   No notable events documented.  Last Vitals:  Vitals:   08/14/20 0420 08/14/20 0815  BP: 127/65 138/69  Pulse: 86 92  Resp: 17 18  Temp: 36.8 C 36.9 C  SpO2: 100% 98%    Last Pain:  Vitals:   08/14/20 0815  TempSrc: Oral  PainSc:                  Nolon Nations

## 2020-08-14 NOTE — Progress Notes (Addendum)
      [address]       [place] [address]             [phone]      1 Day Post-Op Procedure(s) (LRB): IRRIGATION AND DEBRIDEMENT CHEST WALL ABSCESS (N/A) Subjective: Awake and alert, her mother is at the bedside.  Says she feels much better today. Denies pain.   Objective: Vital signs in last 24 hours: Temp:  [97.2 F (36.2 C)-98.9 F (37.2 C)] 98.4 F (36.9 C) (08/10 0815) Pulse Rate:  [86-102] 92 (08/10 0815) Cardiac Rhythm: Normal sinus rhythm (08/10 0700) Resp:  [15-20] 18 (08/10 0815) BP: (124-140)/(65-89) 138/69 (08/10 0815) SpO2:  [93 %-100 %] 98 % (08/10 0815) FiO2 (%):  [21 %] 21 % (08/09 0832)    Intake/Output from previous day: 08/09 0701 - 08/10 0700 In: 400 [I.V.:400] Out: 2 [Blood:2] Intake/Output this shift: No intake/output data recorded.  General appearance: awake and alert.  Neurologic: intact Heart: RRR, few pvc's Lungs: having a coarse, non-productive cough.  Abdomen: soft, NT. Wound: erythema and induration along the PleurX tunnel have resolved.   Lab Results: Recent Labs    08/13/20 0130 08/14/20 0420  WBC 9.2 12.5*  HGB 8.6* 8.1*  HCT 27.0* 24.9*  PLT 429* 458*    BMET:  Recent Labs    08/13/20 0130 08/14/20 0420  NA 140 136  K 3.1* 4.0  CL 103 101  CO2 32 26  GLUCOSE 110* 104*  BUN 5* 5*  CREATININE 0.50 0.51  CALCIUM 8.7* 8.7*     PT/INR:  No results for input(s): LABPROT, INR in the last 72 hours.  ABG    Component Value Date/Time   PHART 7.435 07/25/2020 0953   HCO3 26.1 07/25/2020 0953   O2SAT 97.0 07/25/2020 0953   CBG (last 3)  No results for input(s): GLUCAP in the last 72 hours.  Assessment/Plan:  -Soft tissue abscess along the PleurX catheter tract presenting with sepsis. Catheter removed on 8/6.  Wound culture shows MSSA. Erythema and induration have resolved, WBC trending down. OK to discharge to home today or oral ABX x 7 days. Her family or HHRN to assist with daily wound  packing to PleurX exit site. Will arrange follow up with our office in about 1 week.    LOS: 5 days    Antony Odea, [place] [phone] 08/14/2020   Agree with above. Cleared for discharge from a surgical standpoint.  Darleene Cumpian Bary Leriche

## 2020-08-15 ENCOUNTER — Inpatient Hospital Stay: Payer: BC Managed Care – PPO | Admitting: Oncology

## 2020-08-15 ENCOUNTER — Inpatient Hospital Stay: Payer: BC Managed Care – PPO

## 2020-08-15 ENCOUNTER — Encounter: Payer: Self-pay | Admitting: Oncology

## 2020-08-15 ENCOUNTER — Other Ambulatory Visit: Payer: Self-pay

## 2020-08-15 ENCOUNTER — Inpatient Hospital Stay: Payer: BC Managed Care – PPO | Admitting: Pharmacist

## 2020-08-15 ENCOUNTER — Inpatient Hospital Stay (HOSPITAL_BASED_OUTPATIENT_CLINIC_OR_DEPARTMENT_OTHER): Payer: BC Managed Care – PPO | Admitting: Oncology

## 2020-08-15 ENCOUNTER — Ambulatory Visit: Payer: BC Managed Care – PPO | Admitting: Dermatology

## 2020-08-15 VITALS — BP 110/70 | HR 109 | Temp 98.7°F | Resp 20 | Wt 180.3 lb

## 2020-08-15 DIAGNOSIS — R509 Fever, unspecified: Secondary | ICD-10-CM

## 2020-08-15 DIAGNOSIS — Z170 Estrogen receptor positive status [ER+]: Secondary | ICD-10-CM

## 2020-08-15 DIAGNOSIS — C50411 Malignant neoplasm of upper-outer quadrant of right female breast: Secondary | ICD-10-CM

## 2020-08-15 LAB — COMPREHENSIVE METABOLIC PANEL
ALT: 12 U/L (ref 0–44)
AST: 17 U/L (ref 15–41)
Albumin: 3 g/dL — ABNORMAL LOW (ref 3.5–5.0)
Alkaline Phosphatase: 57 U/L (ref 38–126)
Anion gap: 9 (ref 5–15)
BUN: 8 mg/dL (ref 6–20)
CO2: 32 mmol/L (ref 22–32)
Calcium: 8.9 mg/dL (ref 8.9–10.3)
Chloride: 95 mmol/L — ABNORMAL LOW (ref 98–111)
Creatinine, Ser: 0.62 mg/dL (ref 0.44–1.00)
GFR, Estimated: >60 mL/min (ref >60)
Glucose, Bld: 119 mg/dL — ABNORMAL HIGH (ref 70–99)
Potassium: 3.6 mmol/L (ref 3.5–5.1)
Sodium: 136 mmol/L (ref 135–145)
Total Bilirubin: 0.4 mg/dL (ref 0.3–1.2)
Total Protein: 7.4 g/dL (ref 6.5–8.1)

## 2020-08-15 LAB — CBC WITH DIFFERENTIAL/PLATELET
Abs Immature Granulocytes: 0.08 10*3/uL — ABNORMAL HIGH (ref 0.00–0.07)
Basophils Absolute: 0.1 10*3/uL (ref 0.0–0.1)
Basophils Relative: 1 %
Eosinophils Absolute: 0.1 10*3/uL (ref 0.0–0.5)
Eosinophils Relative: 1 %
HCT: 32.8 % — ABNORMAL LOW (ref 36.0–46.0)
Hemoglobin: 10.4 g/dL — ABNORMAL LOW (ref 12.0–15.0)
Immature Granulocytes: 1 %
Lymphocytes Relative: 12 %
Lymphs Abs: 1.2 10*3/uL (ref 0.7–4.0)
MCH: 29.8 pg (ref 26.0–34.0)
MCHC: 31.7 g/dL (ref 30.0–36.0)
MCV: 94 fL (ref 80.0–100.0)
Monocytes Absolute: 0.9 10*3/uL (ref 0.1–1.0)
Monocytes Relative: 9 %
Neutro Abs: 7.7 10*3/uL (ref 1.7–7.7)
Neutrophils Relative %: 76 %
Platelets: 539 10*3/uL — ABNORMAL HIGH (ref 150–400)
RBC: 3.49 MIL/uL — ABNORMAL LOW (ref 3.87–5.11)
RDW: 12.5 % (ref 11.5–15.5)
WBC: 10 10*3/uL (ref 4.0–10.5)
nRBC: 0 % (ref 0.0–0.2)

## 2020-08-15 LAB — AEROBIC/ANAEROBIC CULTURE W GRAM STAIN (SURGICAL/DEEP WOUND)

## 2020-08-15 LAB — CBC
HCT: 28.3 % — ABNORMAL LOW (ref 36.0–46.0)
Hemoglobin: 9.2 g/dL — ABNORMAL LOW (ref 12.0–15.0)
MCH: 30.3 pg (ref 26.0–34.0)
MCHC: 32.5 g/dL (ref 30.0–36.0)
MCV: 93.1 fL (ref 80.0–100.0)
Platelets: 493 10*3/uL — ABNORMAL HIGH (ref 150–400)
RBC: 3.04 MIL/uL — ABNORMAL LOW (ref 3.87–5.11)
RDW: 12.4 % (ref 11.5–15.5)
WBC: 10.7 10*3/uL — ABNORMAL HIGH (ref 4.0–10.5)
nRBC: 0 % (ref 0.0–0.2)

## 2020-08-15 LAB — CULTURE, BLOOD (ROUTINE X 2)
Culture: NO GROWTH
Culture: NO GROWTH

## 2020-08-15 LAB — RESP PANEL BY RT-PCR (FLU A&B, COVID) ARPGX2
Influenza A by PCR: NEGATIVE
Influenza B by PCR: NEGATIVE
SARS Coronavirus 2 by RT PCR: NEGATIVE

## 2020-08-15 LAB — ACID FAST CULTURE WITH REFLEXED SENSITIVITIES (MYCOBACTERIA): Acid Fast Culture: NEGATIVE

## 2020-08-15 MED ORDER — LEUPROLIDE ACETATE 3.75 MG IM KIT
3.7500 mg | PACK | Freq: Once | INTRAMUSCULAR | Status: AC
  Administered 2020-08-15: 3.75 mg via INTRAMUSCULAR
  Filled 2020-08-15: qty 3.75

## 2020-08-15 MED ORDER — OXYCODONE HCL 5 MG PO TABS: 5.0000 mg | ORAL_TABLET | ORAL | 0 refills | Status: DC | PRN

## 2020-08-15 MED ORDER — HYDROCORTISONE 1 % EX CREA: 1.0000 "application " | TOPICAL_CREAM | Freq: Two times a day (BID) | CUTANEOUS | 0 refills | Status: DC

## 2020-08-15 MED ORDER — CEFDINIR 300 MG PO CAPS: 300.0000 mg | ORAL_CAPSULE | Freq: Two times a day (BID) | ORAL | 0 refills | Status: AC

## 2020-08-15 MED ORDER — ACETAMINOPHEN 325 MG PO TABS
650.0000 mg | ORAL_TABLET | Freq: Four times a day (QID) | ORAL | Status: AC | PRN
  Administered 2020-08-15: 650 mg via ORAL
  Filled 2020-08-15: qty 2

## 2020-08-15 MED ORDER — ALBUTEROL SULFATE HFA 108 (90 BASE) MCG/ACT IN AERS: 2.0000 | INHALATION_SPRAY | Freq: Four times a day (QID) | RESPIRATORY_TRACT | 2 refills | Status: DC | PRN

## 2020-08-15 MED ORDER — SENNOSIDES-DOCUSATE SODIUM 8.6-50 MG PO TABS: 1.0000 | ORAL_TABLET | Freq: Two times a day (BID) | ORAL | 0 refills | Status: DC

## 2020-08-15 MED ORDER — ACETAMINOPHEN 325 MG PO TABS: 650.0000 mg | ORAL_TABLET | Freq: Four times a day (QID) | ORAL | Status: DC | PRN

## 2020-08-15 MED ORDER — POLYETHYLENE GLYCOL 3350 17 G PO PACK: 17.0000 g | PACK | Freq: Every day | ORAL | 0 refills | Status: AC

## 2020-08-15 NOTE — Progress Notes (Signed)
Patient was discharged from the hospital today after a being admitted on 8/5. Patient stays she has gained 10 pounds since her last visit and she feels uncomfortable. Patient states she has developed  a dry cough within the last 48 hours. Patient states she is having some shortness of breathe and tightness in her chest and has worsened in the last 3 days. Patient states left incision sight is giving her some pain. Pain level 5. Patient would like medication for Nausea because she gets waves of nausea throughout the day.  Patient also is having issue with constipation but  she going to pick up prescription for mirilax today.

## 2020-08-15 NOTE — Progress Notes (Signed)
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  INTERVAL HISTORY: Patient returns to clinic today for further evaluation and initiation of treatment.  She was discharged from the hospital this morning after having a chest wall abscess at the site of her Pleurx catheter.  She has increased weakness and fatigue as well as anasarca.  She is having pain and worsening cough.  She has no neurologic complaints.  She has a good appetite and has weight gain from fluid retention.  She denies any hemoptysis.  She denies any nausea, vomiting, constipation, or diarrhea.  She has no urinary complaints.  Patient offers no further specific complaints today.  REVIEW OF SYSTEMS:   Review of Systems  Constitutional:  Positive for malaise/fatigue. Negative for fever and weight loss.  Respiratory:  Positive for cough and shortness of breath. Negative for hemoptysis.   Cardiovascular:  Positive for chest pain. Negative for leg swelling.  Gastrointestinal: Negative.  Negative for abdominal pain.  Genitourinary: Negative.  Negative for dysuria.  Musculoskeletal: Negative.  Negative for back pain.  Skin: Negative.  Negative for rash.  Neurological:  Positive for weakness. Negative for dizziness, focal weakness and headaches.  Psychiatric/Behavioral: Negative.  The patient is not nervous/anxious.    As per HPI. Otherwise, a complete review of systems is  negative.  PAST MEDICAL HISTORY: Past Medical History:  Diagnosis Date   Anxiety    Family history of breast cancer    HSV (herpes simplex virus) anogenital infection    positive titer only    PAST SURGICAL HISTORY: Past Surgical History:  Procedure Laterality Date   BREAST LUMPECTOMY WITH AXILLARY LYMPH NODE BIOPSY Right 11/21/2018   Procedure: RIGHT BREAST LUMPECTOMY WITH SENTINEL LYMPH NODE BIOPSY;  Surgeon: Jovita Kussmaul, MD;  Location: [place];  Service: General;  Laterality: Right;   BREAST REDUCTION SURGERY Bilateral 11/21/2018   Procedure: BILATERAL MAMMARY REDUCTION  (BREAST);  Surgeon: Wallace Going, DO;  Location: [place];  Service: Plastics;  Laterality: Bilateral;   CHEST TUBE INSERTION Left 07/29/2020   Procedure: INSERTION PLEURAL DRAINAGE CATHETER;  Surgeon: Lajuana Matte, MD;  Location: [place];  Service: Thoracic;  Laterality: Left;   IRRIGATION AND DEBRIDEMENT STERNOCLAVICULAR JOINT-STERNUM AND RIBS N/A 08/13/2020   Procedure: IRRIGATION AND DEBRIDEMENT CHEST WALL ABSCESS;  Surgeon: Lajuana Matte, MD;  Location: [place];  Service: Cardiothoracic;  Laterality: N/A;   PLEURAL BIOPSY Left 07/29/2020   Procedure: PLEURAL BIOPSY;  Surgeon: Lajuana Matte, MD;  Location: [place];  Service: Thoracic;  Laterality: Left;   PLEURAL EFFUSION DRAINAGE Left 07/29/2020   Procedure: DRAINAGE OF PLEURAL EFFUSION;  Surgeon: Lajuana Matte, MD;  Location: [place];  Service: Thoracic;  Laterality: Left;   PORT-A-CATH REMOVAL N/A 07/06/2019   Procedure: REMOVAL PORT-A-CATH;  Surgeon: Jovita Kussmaul, MD;  Location: [place];  Service: General;  Laterality: N/A;   PORTACATH PLACEMENT N/A 11/21/2018   Procedure: INSERTION LEFT PORT-A-CATH WITH ULTRASOUND GUIDANCE;  Surgeon: Jovita Kussmaul, MD;  Location: [facility]  OR;  Service: General;  Laterality: N/A;   TONSILLECTOMY  1987   TONSILLECTOMY     VIDEO ASSISTED THORACOSCOPY Left 07/29/2020   Procedure: VIDEO  ASSISTED THORACOSCOPY;  Surgeon: Lajuana Matte, MD;  Location: [facility];  Service: Thoracic;  Laterality: Left;   WISDOM TOOTH EXTRACTION      FAMILY HISTORY: Family History  Problem Relation Age of Onset   Hypertension Father    Alcohol abuse Paternal Grandfather    Heart disease Paternal Grandfather    Alcohol abuse Maternal Grandfather    Heart disease Maternal Grandmother    Breast cancer Other     ADVANCED DIRECTIVES (Y/N):  N  HEALTH MAINTENANCE: Social History   Tobacco Use   Smoking status: Former    Packs/day: 0.50    Years: 20.00    Pack years: 10.00    Types: Cigarettes    Start date: 01/13/2002    Quit date: 10/17/2016    Years since quitting: 3.8   Smokeless tobacco: Never  Vaping Use   Vaping Use: Never used  Substance Use Topics   Alcohol use: Yes    Comment: Socially   Drug use: No     Colonoscopy:  PAP:  Bone density:  Lipid panel:  Allergies  Allergen Reactions   Betadine [Povidone-Iodine]     Current Outpatient Medications  Medication Sig Dispense Refill   acetaminophen (TYLENOL) 325 MG tablet Take 2 tablets (650 mg total) by mouth every 6 (six) hours as needed for mild pain (or Fever >/= 101).     albuterol (VENTOLIN HFA) 108 (90 Base) MCG/ACT inhaler Inhale 2 puffs into the lungs every 6 (six) hours as needed for wheezing or shortness of breath. 8 g 2   amphetamine-dextroamphetamine (ADDERALL XR) 30 MG 24 hr capsule Take 30 mg by mouth daily.     amphetamine-dextroamphetamine (ADDERALL) 10 MG tablet Take 10 mg by mouth daily in the afternoon.     Ascorbic Acid (VITAMIN C) 500 MG CAPS Take 500 mg by mouth daily.     cefdinir (OMNICEF) 300 MG capsule Take 1 capsule (300 mg total) by mouth 2 (two) times daily for 7 days. 14 capsule 0   HYDROcodone-acetaminophen (NORCO) 10-325 MG tablet Take 1 tablet by mouth every 4 (four) hours as needed for moderate pain. 60 tablet 0   hydrocortisone cream 1 % Apply 1 application topically 2 (two) times  daily. 30 g 0   hydrOXYzine (ATARAX/VISTARIL) 10 MG tablet Take 10 mg by mouth at bedtime as needed for anxiety or itching (Sleep).     ibuprofen (ADVIL) 200 MG tablet Take 400 mg by mouth every 6 (six) hours as needed for mild pain.     letrozole (FEMARA) 2.5 MG tablet Take 1 tablet (2.5 mg total) by mouth daily. 90 tablet 3   Multiple Vitamin (MULTIVITAMIN WITH MINERALS) TABS tablet Take 1 tablet by mouth daily.     oxyCODONE (OXY IR/ROXICODONE) 5 MG immediate release tablet Take 1 tablet (5 mg total) by mouth every 4 (four) hours as needed for severe pain or moderate pain. 30 tablet 0   palbociclib (IBRANCE) 125 MG tablet Take 1 tablet (125 mg total) by mouth daily. Take for 21 days on, 7 days off, repeat every 28 days. 21 tablet 0   polyethylene glycol (MIRALAX / GLYCOLAX) 17 g packet Take 17 g by mouth daily. 14 each 0   senna-docusate (SENOKOT-S) 8.6-50 MG tablet Take 1 tablet by mouth 2 (two) times daily. 30 tablet 0  Tetrahydrozoline HCl (VISINE OP) Place 1 drop into both eyes daily as needed (redness).     venlafaxine XR (EFFEXOR-XR) 75 MG 24 hr capsule Take 1 capsule (75 mg total) by mouth at bedtime.     vitamin B-12 (CYANOCOBALAMIN) 1000 MCG tablet Take 1,000 mcg by mouth daily.     No current facility-administered medications for this visit.   Facility-Administered Medications Ordered in Other Visits  Medication Dose Route Frequency Provider Last Rate Last Admin   acetaminophen (TYLENOL) tablet 650 mg  650 mg Oral Q6H PRN Lloyd Huger, MD   650 mg at 08/15/20 1517    OBJECTIVE: Vitals:   08/15/20 1344  BP: 110/70  Pulse: (!) 109  Resp: 20  Temp: 98.7 F (37.1 C)  SpO2: 100%      Body mass index is 30.95 kg/m.    ECOG FS:0 - Asymptomatic  General: Well-developed, well-nourished, no acute distress. Eyes: Pink conjunctiva, anicteric sclera. HEENT: Normocephalic, moist mucous membranes. Lungs: No audible wheezing or coughing. Heart: Regular rate and  rhythm. Abdomen: Mildly distended. Musculoskeletal: No edema, cyanosis, or clubbing. Neuro: Alert, answering all questions appropriately. Cranial nerves grossly intact. Skin: No rashes or petechiae noted. Psych: Normal affect.  LAB RESULTS:  Lab Results  Component Value Date   NA 136 08/15/2020   K 3.6 08/15/2020   CL 95 (L) 08/15/2020   CO2 32 08/15/2020   GLUCOSE 119 (H) 08/15/2020   BUN 8 08/15/2020   CREATININE 0.62 08/15/2020   CALCIUM 8.9 08/15/2020   PROT 7.4 08/15/2020   ALBUMIN 3.0 (L) 08/15/2020   AST 17 08/15/2020   ALT 12 08/15/2020   ALKPHOS 57 08/15/2020   BILITOT 0.4 08/15/2020   GFRNONAA >60 08/15/2020   GFRAA >60 08/07/2019    Lab Results  Component Value Date   WBC 10.0 08/15/2020   NEUTROABS 7.7 08/15/2020   HGB 10.4 (L) 08/15/2020   HCT 32.8 (L) 08/15/2020   MCV 94.0 08/15/2020   PLT 539 (H) 08/15/2020     STUDIES: DG Chest 2 View  Result Date: 08/09/2020 CLINICAL DATA:  Chest tube fever EXAM: CHEST - 2 VIEW COMPARISON:  07/30/2020, CT 07/03/2020 FINDINGS: Left lower chest drainage catheter slightly retracted with removal of previously noted additional pleural drainage catheter. Right lung grossly clear. Slightly improved aeration left base with residual left pleural disease and airspace disease at left base. Normal cardiac size. IMPRESSION: Slightly improved aeration at left base compared to prior radiograph. Otherwise no significant change in residual left pleural effusion/disease and mild airspace disease at left base Electronically Signed   By: Donavan Foil M.D.   On: 08/09/2020 18:25   DG Chest 2 View  Result Date: 07/29/2020 CLINICAL DATA:  Preop for pleural effusion EXAM: CHEST - 2 VIEW COMPARISON:  07/22/2020 FINDINGS: Similar degree of moderate left pleural effusion recently evaluated by CT and PET. Normal heart size and mediastinal contours. Postoperative right axilla. IMPRESSION: Unchanged moderate left pleural effusion. Electronically  Signed   By: Monte Fantasia M.D.   On: 07/29/2020 06:30   DG Chest 2 View  Result Date: 07/22/2020 CLINICAL DATA:  Pleural effusion EXAM: CHEST - 2 VIEW COMPARISON:  07/19/2020, PET CT 07/18/2020 FINDINGS: Moderate left pleural effusion with probable loculation. Airspace disease at the left base as before. Normal cardiomediastinal silhouette. No pneumothorax. IMPRESSION: No significant change in moderate left pleural effusion with probable loculation. Electronically Signed   By: Donavan Foil M.D.   On: 07/22/2020 15:40   CT Chest W  Contrast  Result Date: 08/09/2020 CLINICAL DATA:  Sepsis. Left PleurX in place. Signs of cellulitis at access site. EXAM: CT CHEST WITH CONTRAST TECHNIQUE: Multidetector CT imaging of the chest was performed during intravenous contrast administration. CONTRAST:  34mL OMNIPAQUE IOHEXOL 350 MG/ML SOLN COMPARISON:  07/03/2020 FINDINGS: Cardiovascular: Heart is normal size. Aorta is normal caliber. Mediastinum/Nodes: No mediastinal, hilar, or axillary adenopathy. Trachea and esophagus are unremarkable. Thyroid unremarkable. Lungs/Pleura: Left PleurX catheter is in place in the left lower hemithorax. Loculated left hydropneumothorax present. Overall, amount of fluid has decreased since prior study. Airspace disease throughout the lingula and left lower lobe, similar to prior study. Right lung clear. Upper Abdomen: No acute findings Musculoskeletal: There is stranding in the left lateral subcutaneous soft tissues in the lower left chest and upper abdomen/flank region which could reflect cellulitis. No focal fluid collections. No acute bony abnormality. IMPRESSION: PleurX catheter in place on the left with loculated left hydropneumothorax. Overall, the fusion is smaller in size when compared to prior study. Continued areas of consolidation in the lingula and left lower lobe concerning for pneumonia, unchanged. Stranding in the subcutaneous soft tissues in the lateral left lower chest  wall and upper abdominal wall/flank compatible with cellulitis. Electronically Signed   By: Rolm Baptise M.D.   On: 08/09/2020 19:24   CT CHEST ABDOMEN PELVIS W CONTRAST  Result Date: 08/11/2020 CLINICAL DATA:  Empyema.  Pleural effusion. EXAM: CT CHEST, ABDOMEN, AND PELVIS WITH CONTRAST TECHNIQUE: Multidetector CT imaging of the chest, abdomen and pelvis was performed following the standard protocol during bolus administration of intravenous contrast. CONTRAST:  147mL OMNIPAQUE IOHEXOL 300 MG/ML  SOLN COMPARISON:  CT chest 08/09/2020 FINDINGS: CT CHEST FINDINGS Cardiovascular: No significant vascular findings. Normal heart size. No pericardial effusion. Mediastinum/Nodes: Small lymph nodes in the thoracic inlet and anterior mediastinum measure less than 1 centimeter in diameter and are likely reactive. Small lymph nodes are also noted at level 1, 2, and 3 of the LEFT axilla, likely reactive. Patient has had previous RIGHT axillary node dissection. The visualized portion of the thyroid gland has a normal appearance. Esophagus is unremarkable. Lungs/Pleura: LEFT PleurX catheter has been removed. There is persistent pleural thickening and pleural rind with appearance similar to prior studies. Small locules of gas are identified within the pleural space at the LEFT lung base. There is increased ground-glass opacity within the RIGHT UPPER and LOWER lobes, with dense consolidation at the LEFT lung base, increased since prior study. Air bronchograms are seen throughout the LEFT LOWER lobe and LOWER LEFT UPPER lobe. A 4 millimeter nodule is identified in the LATERAL aspect of the RIGHT UPPER lobe (image 62 of series 5). A nodule measures 8 millimeters within the posterior RIGHT LOWER lobe on image 99 of series 5. Musculoskeletal: No chest wall mass or suspicious bone lesions identified. CT ABDOMEN PELVIS FINDINGS Hepatobiliary: No focal liver abnormality is seen. No radiopaque gallstones, biliary dilatation, or  pericholecystic inflammatory changes. Pancreas: Unremarkable. No pancreatic ductal dilatation or surrounding inflammatory changes. Spleen: Normal in size without focal abnormality. Adrenals/Urinary Tract: Adrenal glands are normal. No renal mass or hydronephrosis. Ureters are unremarkable. The bladder and visualized portion of the urethra are normal. Stomach/Bowel: Stomach is distended by a significant debris. Elevated LEFT hemidiaphragm. Small bowel loops are unremarkable. Moderate stool burden. No obstructing colonic mass. Normal appendix. Vascular/Lymphatic: No significant vascular findings are present. No enlarged abdominal or pelvic lymph nodes. Reproductive: Uterus contains intrauterine device, in expected location. No adnexal mass. Small amount of  free pelvic fluid is likely physiologic. Other: Body wall edema, LEFT greater than RIGHT. Musculoskeletal: Degenerative changes are identified at L5-S1. IMPRESSION: 1. Increased ground-glass opacity and consolidation in the LEFT lung, consistent with infectious process. 2. Persistent loculated pleural effusion and thickened pleural rind, consistent with empyema. 3. Stable RIGHT lung nodules, largest 8 millimeters. These warrant follow-up; metastatic disease cannot be excluded. 4. Probably reactive lymph nodes in the thoracic inlet, anterior mediastinum, and LEFT axilla. 5. Status post RIGHT axillary dissection. 6. Significant stool burden. 7. Body wall edema. Electronically Signed   By: Nolon Nations M.D.   On: 08/11/2020 17:06   NM PET Image Initial (PI) Skull Base To Thigh  Result Date: 07/19/2020 CLINICAL DATA:  Initial treatment strategy for loculated effusion and nodularity of the pleural surface in a patient with history of breast cancer. EXAM: NUCLEAR MEDICINE PET SKULL BASE TO THIGH TECHNIQUE: 8.8 mCi F-18 FDG was injected intravenously. Full-ring PET imaging was performed from the skull base to thigh after the radiotracer. CT data was obtained and  used for attenuation correction and anatomic localization. Fasting blood glucose: 84 mg/dl COMPARISON:  Chest CT July 03, 2020. FINDINGS: Mediastinal blood pool activity: SUV max 1.97 Liver activity: SUV max NA NECK: No hypermetabolic lymph nodes in the neck. Incidental CT findings: none CHEST: Circumferential pleural nodularity associated with the loculated pleural effusion that was seen on previous chest imaging. This involves the entire circumference of the pleura and shows a maximum SUV in the range of 9.5 posteriorly along the inferior chest and 13.1 anteriorly along the inferior chest measured on image 123 of series 3. Circumferential pleural thickening is more pronounced in the costodiaphragmatic sulci better demonstrated on the contrasted CT. Nodularity extends along the LEFT mediastinal border (image 86/3). Measures up to 7 mm greatest thickness and shows a maximum SUV in the range of 8.2 LEFT retropectoral lymph node (image 215/6, 76/3 with a maximum SUV of 2.6, this measures less than a cm and there are small retropectoral lymph nodes on the LEFT and a small LEFT supraclavicular lymph node as well on image 66/3 that measures 7 mm showing a maximum SUV of 2.2 Signs of RIGHT axillary dissection with a maximum SUV of 1.8 and some soft tissue surrounding surgical clips in this area measuring 1.8 x 1.1 cm (image 85/3) RIGHT retrocrural lymph node (image 130/3) maximum SUV of 5.3 5 mm. No discrete mediastinal adenopathy. Some uptake about the LEFT hilum is contiguous with pleural thickening. 6 mm pulmonary nodule in the RIGHT upper lobe (image 89/3) this is not associated with increased FDG uptake. Well-circumscribed RIGHT lower lobe pulmonary nodule (image 110/3) 7 mm maximum SUV of 1.1 Fissural nodularity in the LEFT mid chest (image 93/3) 11 mm with an X min SUV of 5.6 Incidental CT findings: Normal caliber of the thoracic aorta. Normal heart size. Pericardial thickening and thickening along the LEFT  mediastinal border. Is normal caliber of the central pulmonary vessels. Limited assessment of cardiovascular structures given lack of intravenous contrast. Loculated LEFT effusion of similar volume to previous imaging. ABDOMEN/PELVIS: Heterogeneous uptake in the liver without discrete CT correlate. More focal area of uptake in hepatic subsegment VIII is equivocal with a maximum SUV of 3.6 (image 154/0) this is of uncertain significance given the heterogeneity seen with respect to hepatic uptake in general. No signs of visceral or nodal disease in the abdomen Incidental CT findings: No acute findings relative liver, gallbladder, spleen, pancreas, adrenal glands or kidneys. The urinary bladder is  collapsed. No signs of bowel obstruction. Appendix is normal. IUD in place. No intra-abdominal ascites. SKELETON: No focal hypermetabolic activity to suggest skeletal metastasis. Incidental CT findings: none IMPRESSION: Signs of circumferential pleural disease most likely related to metastatic disease from breast cancer given patient history. This pattern can also be seen in the setting of mesothelioma and is associated with a loculated LEFT pleural effusion. Retrocrural lymph node with definite involvement. Nodules in the RIGHT chest without increased metabolic activity dominant nodule in the RIGHT lower lobe. Suspicious given other findings, attention on follow-up. Equivocal retropectoral and supraclavicular lymph nodes, attention on follow-up. Heterogeneous uptake pattern in the liver. Slightly more pronounced area in the posterior RIGHT hemi liver is equivocal. Could consider hepatic MRI for further assessment with and without contrast. Eovist contrast is often helpful in this setting. Electronically Signed   By: Zetta Bills M.D.   On: 07/19/2020 15:37   DG CHEST PORT 1 VIEW  Result Date: 08/11/2020 CLINICAL DATA:  Shortness of breath.  Malignant pleural effusion. EXAM: PORTABLE CHEST 1 VIEW COMPARISON:  08/09/2020  FINDINGS: The LEFT pleural catheter has been removed. A LEFT pleural effusion appears relatively unchanged. Increasing LEFT mid and lower lung opacities noted which may represent atelectasis and/or infection. There is no evidence of pneumothorax. The LEFT lung is clear. No acute bony abnormalities are present. IMPRESSION: Increasing LEFT mid and lower lung opacities which may represent atelectasis and/or infection. LEFT pleural catheter removal without significant change of LEFT pleural effusion. Electronically Signed   By: Margarette Canada M.D.   On: 08/11/2020 11:27   DG CHEST PORT 1 VIEW  Result Date: 07/30/2020 CLINICAL DATA:  Chest tube. EXAM: PORTABLE CHEST 1 VIEW COMPARISON:  07/29/2020. FINDINGS: Left chest tubes in stable position. No pneumothorax. Stable mild left pleural thickening/pleural effusion. Low lung volumes with persistent bibasilar atelectasis. Stable cardiomegaly. No acute bony abnormality. IMPRESSION: 1. Left chest tube in stable position. No pneumothorax. Stable mild left pleural thickening/pleural effusion. 2.  Low lung volumes with persistent bibasilar atelectasis. 3.  Stable cardiomegaly. Electronically Signed   By: Marcello Moores  Register   On: 07/30/2020 07:38   DG Chest Port 1 View  Result Date: 07/29/2020 CLINICAL DATA:  Status post pleural drainage catheter placement. EXAM: PORTABLE CHEST 1 VIEW COMPARISON:  Same day. FINDINGS: Stable cardiomegaly. Interval placement of left-sided chest tube. Left pleural effusion is significantly smaller. Right lung is clear. IMPRESSION: Left pleural effusion is significantly smaller status post left-sided chest tube placement. Electronically Signed   By: Marijo Conception M.D.   On: 07/29/2020 11:55   DG Chest Port 1 View  Result Date: 07/19/2020 CLINICAL DATA:  Post left-sided thoracentesis. EXAM: PORTABLE CHEST 1 VIEW COMPARISON:  07/01/2020; chest CT-07/03/2020; PET-CT-07/18/2020 FINDINGS: Grossly unchanged cardiac silhouette and mediastinal  contours. Interval reduction in persistent moderate-sized partially loculated left-sided effusion post thoracentesis. No pneumothorax. Residual left basilar heterogeneous/consolidative opacities, unchanged. The right hemithorax remains well aerated. No new focal airspace opacities. No evidence of edema. No acute osseous abnormalities. Surgical clips overlie the upper outer quadrant of the right breast. IMPRESSION: 1. Slight reduction in persistent moderate sized partially loculated left-sided effusion post thoracentesis. No pneumothorax. 2. Unchanged left basilar heterogeneous/consolidative opacities, likely atelectasis. Electronically Signed   By: Sandi Mariscal M.D.   On: 07/19/2020 15:20   US THORACENTESIS ASP PLEURAL SPACE W/IMG GUIDE  Result Date: 07/19/2020 INDICATION: Recurrent symptomatic left-sided pleural effusion. Please perform ultrasound-guided thoracentesis for diagnostic and therapeutic purposes. EXAM: US THORACENTESIS ASP PLEURAL SPACE W/IMG GUIDE  COMPARISON:  PET-CT-07/18/2020; chest CT-07/03/2020; ultrasound-guided left-sided thoracentesis-07/03/2020 (yielding 700 cc of pleural fluid). MEDICATIONS: None. COMPLICATIONS: None immediate. TECHNIQUE: Informed written consent was obtained from the patient after a discussion of the risks, benefits and alternatives to treatment. A timeout was performed prior to the initiation of the procedure. Initial ultrasound scanning demonstrates a moderate sized recurrent left-sided pleural effusion the basilar component of which is noted to be extensively loculated. An adequate percutaneous window was identified at the level of the left mid lateral chest and the procedure was planned. The chest was prepped and draped in the usual sterile fashion. 1% lidocaine was used for local anesthesia. An ultrasound image was saved for documentation purposes. An 8 Fr Safe-T-Centesis catheter was introduced. The thoracentesis was performed. The catheter was removed and a dressing  was applied. The patient tolerated the procedure well without immediate post procedural complication. The patient was escorted to have an upright chest radiograph. FINDINGS: A total of approximately 275 cc of serous fluid was removed. Requested samples were sent to the laboratory. IMPRESSION: Successful ultrasound-guided left sided thoracentesis yielding 275 cc of pleural fluid. Note, an incomplete thoracentesis was performed secondary to extensive loculations within the pleural fluid. Electronically Signed   By: Sandi Mariscal M.D.   On: 07/19/2020 15:35    ONCOLOGY HISTORY: Patient initially self palpated a lump in the 12 o'clock position of her right breast and subsequently underwent biopsy on September 26, 2018 confirming malignancy.  Initial MammaPrint was reported high risk.  She underwent right lumpectomy on November 21, 2018 which revealed a T2, N1, M0 stage IIa malignancy.  1 of 4 lymph nodes were positive for disease.  She subsequently underwent adjuvant chemotherapy with AC/Taxol completing treatment on Jun 01, 2019.  She then completed adjuvant XRT on August 11, 2019 and was started on tamoxifen.  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  She has noted to have retrocrural lymph node with definite involvement and suspicious retroperitoneal and supraclavicular nodes.  She had equivocal hypermetabolism in her liver.  Patient also underwent second opinion at [facility] in [place].  Agree with recommendation to proceed with Lupron 3.75 mg every 28 days for up to 24 months for ovarian suppression along with an aromatase inhibitor  and Ibrance 125 mg for 21 days with 7 days off.  She has been instructed to discontinue tamoxifen.  Can also consider capecitabine or [place] in the future if treatment is not tolerated or patient has progression of disease.  MRI of the brain to complete staging work-up is scheduled for Saturday.  Proceed with cycle 1 of Lupron, letrozole, and Ibrance.  Return to clinic in 1 week for laboratory work and continued evaluation. Cough: Patient reports she has Tussionex at home and was also given a prescription for hydrocodone upon discharge today.  Will also call in albuterol inhaler. Anasarca/fluid retention: Likely from inactivity and significant amount of IV fluids given in hospital.  Monitor. Pleural abscess: Treated and drained by thoracic surgery in [place].  Continue antibiotics as prescribed.  I spent a total of 30 minutes reviewing chart data, face-to-face evaluation with the patient, counseling and coordination of care as detailed above.  Patient expressed understanding and was in agreement with this plan. She also understands that She can call clinic at any time with any questions, concerns, or complaints.   Cancer Staging Malignant neoplasm of upper-outer quadrant of right breast in female, estrogen receptor positive (Ada) Staging form: Breast, AJCC 8th Edition - Clinical stage from 10/05/2018: Stage IB (cT2, cN0, cM0, G2, ER+, PR+, HER2-) - Signed by Chauncey Cruel, MD on 04/02/2020 Stage prefix: Initial diagnosis Histologic grading system: 3 grade system - Pathologic stage from 08/09/2020: No Stage Recommended (ypT2, pN1a, pM1, G2, ER+, PR+, HER2-) - Signed by Lloyd Huger, MD on 08/09/2020 Stage prefix: Post-therapy Multigene prognostic tests performed: MammaPrint Histologic grading system: 3 grade system   Lloyd Huger, MD   08/15/2020 5:14 PM

## 2020-08-15 NOTE — Plan of Care (Signed)
  Problem: Fluid Volume: Goal: Hemodynamic stability will improve Outcome: Adequate for Discharge   

## 2020-08-15 NOTE — Discharge Summary (Addendum)
Physician Discharge Summary  Marthann B Loewe MRN:[id number] DOB: 06/27/1980 DOA: 08/09/2020  PCP: Linthavong, Kanhka, MD  Admit date: 08/09/2020 Discharge date: 08/15/2020  Admitted From: home Disposition:  home  Recommendations for Outpatient Follow-up:  Follow up with PCP in 1-2 weeks Please obtain BMP/CBC in one week Please follow up on with Dr. Finnegan, oncology, as scheduled later today.  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 Lightfoot on 7/25 who initially presented to [facility] ED on 8/5 with subsequent transfer to [facility] hospital for fever, weakness/malaise, increased pain and redness at Pleurx catheter site.  Patient reports minimal output from Pleurx catheter.   In the ED,Temperature 100.6 F, HR 117, RR 21, BP 104/55, SPO2 98% on room air.  Sodium 132, potassium 3.3, chloride 100, CO2 23, glucose 226, BUN 9, creatinine 0.63.  Lactic acid 2.5.  WBC 27.2, hemoglobin 11.9, platelets 479.  COVID-19 PCR negative.  Influenza A/B PCR negative.  Urinalysis unrevealing.  Chest x-ray with slightly improved aeration left base compared to prior radiograph; no significant change left pleural effusion/disease.  CT chest with contrast with Pleurx catheter noted in  place on left with loculated left hydropneumothorax, continued consolidation lingula and left lower lobe concerning for pneumonia, stranding subcutaneous soft tissues lateral left lower chest wall and upper abdominal wall/flank compatible with cellulitis.  Blood cultures x2 obtained.  Patient was started on empiric antibiotics with vancomycin and ceftriaxone.  Transferred from [place] for CTS evaluation.  TRH consulted for admission."    Severe sepsis, POA -as evidenced by fever, leukocytosis, lactic acidosis in the setting of cellulitis and probable empyema Loculated hydropneumothorax concerning for empyema, POA Left chest wall cellulitis, POA Lactic acidosis: Resolved --CT surgery, Dr. Lightfoot consulted --Leukocytosis improved --Blood cultures negative  --Wound culture from left Pleurx site grew staph aureus resistant to tetracyclines --Treated inpatient with 2 g Rocephin daily --Discharge with Omnicef x 7 days --Supportive care: pain control, antipyretics PRN    Stage IV ER/PR positive, HER2 negative breast cancer with metastasis to lung pleura -patient follows with Dr. Finnegan with oncology at [facility].  Recent VATS/Pleurx catheter placement on 7/25 with Dr. Lightfoot.  Initially diagnosed with breast cancer in 2020, underwent chemo, radiation and lumpectomy.  Now with recurrence. --Plan is to start Lupron, aromatase inhibitor and Ibrance --Patient sees Dr. Finnegan this  afternoon --Continue letrozole   Headache and cough - tested for Covid-19 today and negative.  If symptoms persist, would re-test in 48 hours.   Opioid-induced Constipation - continue scheduled bowel regimen while requiring pain medications.    Anxiety/depression -continue Effexor. PRN low dose Xanax was used inpatient.    Attention deficit disorder -home Adderall was held, resumed at d/c.   Discharge Instructions   Discharge Instructions     Call MD   for:  extreme fatigue   Complete by: As directed     Call MD for:  persistant dizziness or light-headedness   Complete by: As directed    Call MD for:  severe uncontrolled pain   Complete by: As directed    Call MD for:  temperature >100.4   Complete by: As directed    Diet - low sodium heart healthy   Complete by: As directed    Discharge instructions   Complete by: As directed    For Constipation due to pain medications - recommend taking a stool softener and/or laxative twice daily while you are needing pain medications.  Okay to use Miralax, Senna (or Senna-S which is a bit stronger), Dulcolax are options.   Discharge wound care:   Complete by: As directed    Continue daily wound packing as instructed.  Home health RN arranged for assistance.   Increase activity slowly   Complete by: As directed       Allergies as of 08/15/2020       Reactions   Betadine [povidone-iodine]         Medication List     STOP taking these medications    cephALEXin 500 MG capsule Commonly known as: KEFLEX       TAKE these medications    acetaminophen 325 MG tablet Commonly known as: TYLENOL Take 2 tablets (650 mg total) by mouth every 6 (six) hours as needed for mild pain (or Fever >/= 101).   amphetamine-dextroamphetamine 30 MG 24 hr capsule Commonly known as: ADDERALL XR Take 30 mg by mouth daily.   amphetamine-dextroamphetamine 10 MG tablet Commonly known as: ADDERALL Take 10 mg by mouth daily in the afternoon.   cefdinir 300 MG capsule Commonly known as: OMNICEF Take 1 capsule (300 mg total) by mouth 2 (two) times daily for 7 days.   HYDROcodone-acetaminophen 10-325 MG tablet Commonly known as: NORCO Take 1 tablet by mouth every 4 (four) hours as needed for moderate pain.   hydrocortisone cream 1 % Apply 1 application topically 2 (two) times daily.   hydrOXYzine 10 MG tablet Commonly known as: ATARAX/VISTARIL Take 10 mg by mouth at bedtime as needed for anxiety or itching (Sleep).   Ibrance 125 MG tablet Generic  drug: palbociclib Take 1 tablet (125 mg total) by mouth daily. Take for 21 days on, 7 days off, repeat every 28 days.   ibuprofen 200 MG tablet Commonly known as: ADVIL Take 400 mg by mouth every 6 (six) hours as needed for mild pain.   letrozole 2.5 MG tablet Commonly known as: FEMARA Take 1 tablet (2.5 mg total) by mouth daily.   multivitamin with minerals Tabs tablet Take 1 tablet by mouth daily.   oxyCODONE 5 MG immediate release tablet Commonly known as: Oxy IR/ROXICODONE Take 1 tablet (5 mg total) by mouth every 4 (four) hours as needed for severe pain or moderate pain.   venlafaxine XR 75 MG 24 hr capsule Commonly known as: EFFEXOR-XR Take 1 capsule (75 mg total) by mouth at bedtime.   VISINE OP Place 1 drop into both eyes daily as needed (redness).   vitamin B-12 1000 MCG tablet Commonly known as: CYANOCOBALAMIN Take 1,000 mcg by mouth daily.   Vitamin C 500 MG Caps Take 500 mg by mouth daily.               Discharge Care Instructions  (From admission, onward)           Start       Ordered   08/15/20 0000  Discharge wound care:       Comments: Continue daily wound packing as instructed.  Home health RN arranged for assistance.   08/15/20 0810            Follow-up Information     [facility] [place]. Go on 08/22/2020.   Specialty: Cardiothoracic Surgery Why: Your appointment for a wound check is at 1pm. Contact information: [place], [place] [phone]               Allergies  Allergen Reactions   Betadine [Povidone-Iodine]      If you experience worsening of your admission symptoms, develop shortness of breath, life threatening emergency, suicidal or homicidal thoughts you must seek medical attention immediately by calling 911 or calling your MD immediately  if symptoms less severe.    Please note   You were cared for by a hospitalist during  your hospital stay. If you have any questions about your discharge medications or the care you received while you were in the hospital after you are discharged, you can call the unit and asked to speak with the hospitalist on call if the hospitalist that took care of you is not available. Once you are discharged, your primary care physician will handle any further medical issues. Please note that NO REFILLS for any discharge medications will be authorized once you are discharged, as it is imperative that you return to your primary care physician (or establish a relationship with a primary care physician if you do not have one) for your aftercare needs so that they can reassess your need for medications and monitor your lab values.   Consultations: Cardiothoracic surgery -Dr. Kipp Brood    Procedures/Studies: DG Chest 2 View  Result Date: 08/09/2020 CLINICAL DATA:  Chest tube fever EXAM: CHEST - 2 VIEW COMPARISON:  07/30/2020, CT 07/03/2020 FINDINGS: Left lower chest drainage catheter slightly retracted with removal of previously noted additional pleural drainage catheter. Right lung grossly clear. Slightly improved aeration left base with residual left pleural disease and airspace disease at left base. Normal cardiac size. IMPRESSION: Slightly improved aeration at left base compared to prior radiograph. Otherwise no significant change in residual left pleural effusion/disease and mild airspace disease at left base Electronically Signed   By: Donavan Foil M.D.   On: 08/09/2020 18:25   DG Chest 2 View  Result Date: 07/29/2020 CLINICAL DATA:  Preop for pleural effusion EXAM: CHEST - 2 VIEW COMPARISON:  07/22/2020 FINDINGS: Similar degree of moderate left pleural effusion recently evaluated by CT and PET. Normal heart size and mediastinal contours. Postoperative right axilla. IMPRESSION: Unchanged moderate left pleural effusion. Electronically Signed   By: Monte Fantasia M.D.   On: 07/29/2020 06:30   DG  Chest 2 View  Result Date: 07/22/2020 CLINICAL DATA:  Pleural effusion EXAM: CHEST - 2 VIEW COMPARISON:  07/19/2020, PET CT 07/18/2020 FINDINGS: Moderate left pleural effusion with probable loculation. Airspace disease at the left base as before. Normal cardiomediastinal silhouette. No pneumothorax. IMPRESSION: No significant change in moderate left pleural effusion with probable loculation. Electronically Signed   By: Donavan Foil M.D.   On: 07/22/2020 15:40   CT Chest W Contrast  Result Date: 08/09/2020 CLINICAL DATA:  Sepsis. Left PleurX in place. Signs of cellulitis at access site. EXAM: CT CHEST WITH CONTRAST TECHNIQUE: Multidetector CT imaging of the chest was performed during intravenous contrast administration. CONTRAST:  19m OMNIPAQUE IOHEXOL  350 MG/ML SOLN COMPARISON:  07/03/2020 FINDINGS: Cardiovascular: Heart is normal size. Aorta is normal caliber. Mediastinum/Nodes: No mediastinal, hilar, or axillary adenopathy. Trachea and esophagus are unremarkable. Thyroid unremarkable. Lungs/Pleura: Left PleurX catheter is in place in the left lower hemithorax. Loculated left hydropneumothorax present. Overall, amount of fluid has decreased since prior study. Airspace disease throughout the lingula and left lower lobe, similar to prior study. Right lung clear. Upper Abdomen: No acute findings Musculoskeletal: There is stranding in the left lateral subcutaneous soft tissues in the lower left chest and upper abdomen/flank region which could reflect cellulitis. No focal fluid collections. No acute bony abnormality. IMPRESSION: PleurX catheter in place on the left with loculated left hydropneumothorax. Overall, the fusion is smaller in size when compared to prior study. Continued areas of consolidation in the lingula and left lower lobe concerning for pneumonia, unchanged. Stranding in the subcutaneous soft tissues in the lateral left lower chest wall and upper abdominal wall/flank compatible with cellulitis.  Electronically Signed   By: Kevin  Dover M.D.   On: 08/09/2020 19:24   CT CHEST ABDOMEN PELVIS W CONTRAST  Result Date: 08/11/2020 CLINICAL DATA:  Empyema.  Pleural effusion. EXAM: CT CHEST, ABDOMEN, AND PELVIS WITH CONTRAST TECHNIQUE: Multidetector CT imaging of the chest, abdomen and pelvis was performed following the standard protocol during bolus administration of intravenous contrast. CONTRAST:  100mL OMNIPAQUE IOHEXOL 300 MG/ML  SOLN COMPARISON:  CT chest 08/09/2020 FINDINGS: CT CHEST FINDINGS Cardiovascular: No significant vascular findings. Normal heart size. No pericardial effusion. Mediastinum/Nodes: Small lymph nodes in the thoracic inlet and anterior mediastinum measure less than 1 centimeter in diameter and are likely reactive. Small lymph nodes are also noted at level 1, 2, and 3 of the LEFT axilla, likely reactive. Patient has had previous RIGHT axillary node dissection. The visualized portion of the thyroid gland has a normal appearance. Esophagus is unremarkable. Lungs/Pleura: LEFT PleurX catheter has been removed. There is persistent pleural thickening and pleural rind with appearance similar to prior studies. Small locules of gas are identified within the pleural space at the LEFT lung base. There is increased ground-glass opacity within the RIGHT UPPER and LOWER lobes, with dense consolidation at the LEFT lung base, increased since prior study. Air bronchograms are seen throughout the LEFT LOWER lobe and LOWER LEFT UPPER lobe. A 4 millimeter nodule is identified in the LATERAL aspect of the RIGHT UPPER lobe (image 62 of series 5). A nodule measures 8 millimeters within the posterior RIGHT LOWER lobe on image 99 of series 5. Musculoskeletal: No chest wall mass or suspicious bone lesions identified. CT ABDOMEN PELVIS FINDINGS Hepatobiliary: No focal liver abnormality is seen. No radiopaque gallstones, biliary dilatation, or pericholecystic inflammatory changes. Pancreas: Unremarkable. No  pancreatic ductal dilatation or surrounding inflammatory changes. Spleen: Normal in size without focal abnormality. Adrenals/Urinary Tract: Adrenal glands are normal. No renal mass or hydronephrosis. Ureters are unremarkable. The bladder and visualized portion of the urethra are normal. Stomach/Bowel: Stomach is distended by a significant debris. Elevated LEFT hemidiaphragm. Small bowel loops are unremarkable. Moderate stool burden. No obstructing colonic mass. Normal appendix. Vascular/Lymphatic: No significant vascular findings are present. No enlarged abdominal or pelvic lymph nodes. Reproductive: Uterus contains intrauterine device, in expected location. No adnexal mass. Small amount of free pelvic fluid is likely physiologic. Other: Body wall edema, LEFT greater than RIGHT. Musculoskeletal: Degenerative changes are identified at L5-S1. IMPRESSION: 1. Increased ground-glass opacity and consolidation in the LEFT lung, consistent with infectious process. 2. Persistent loculated pleural effusion and   thickened pleural rind, consistent with empyema. 3. Stable RIGHT lung nodules, largest 8 millimeters. These warrant follow-up; metastatic disease cannot be excluded. 4. Probably reactive lymph nodes in the thoracic inlet, anterior mediastinum, and LEFT axilla. 5. Status post RIGHT axillary dissection. 6. Significant stool burden. 7. Body wall edema. Electronically Signed   By: Elizabeth  Brown M.D.   On: 08/11/2020 17:06   NM PET Image Initial (PI) Skull Base To Thigh  Result Date: 07/19/2020 CLINICAL DATA:  Initial treatment strategy for loculated effusion and nodularity of the pleural surface in a patient with history of breast cancer. EXAM: NUCLEAR MEDICINE PET SKULL BASE TO THIGH TECHNIQUE: 8.8 mCi F-18 FDG was injected intravenously. Full-ring PET imaging was performed from the skull base to thigh after the radiotracer. CT data was obtained and used for attenuation correction and anatomic localization. Fasting  blood glucose: 84 mg/dl COMPARISON:  Chest CT July 03, 2020. FINDINGS: Mediastinal blood pool activity: SUV max 1.97 Liver activity: SUV max NA NECK: No hypermetabolic lymph nodes in the neck. Incidental CT findings: none CHEST: Circumferential pleural nodularity associated with the loculated pleural effusion that was seen on previous chest imaging. This involves the entire circumference of the pleura and shows a maximum SUV in the range of 9.5 posteriorly along the inferior chest and 13.1 anteriorly along the inferior chest measured on image 123 of series 3. Circumferential pleural thickening is more pronounced in the costodiaphragmatic sulci better demonstrated on the contrasted CT. Nodularity extends along the LEFT mediastinal border (image 86/3). Measures up to 7 mm greatest thickness and shows a maximum SUV in the range of 8.2 LEFT retropectoral lymph node (image 215/6, 76/3 with a maximum SUV of 2.6, this measures less than a cm and there are small retropectoral lymph nodes on the LEFT and a small LEFT supraclavicular lymph node as well on image 66/3 that measures 7 mm showing a maximum SUV of 2.2 Signs of RIGHT axillary dissection with a maximum SUV of 1.8 and some soft tissue surrounding surgical clips in this area measuring 1.8 x 1.1 cm (image 85/3) RIGHT retrocrural lymph node (image 130/3) maximum SUV of 5.3 5 mm. No discrete mediastinal adenopathy. Some uptake about the LEFT hilum is contiguous with pleural thickening. 6 mm pulmonary nodule in the RIGHT upper lobe (image 89/3) this is not associated with increased FDG uptake. Well-circumscribed RIGHT lower lobe pulmonary nodule (image 110/3) 7 mm maximum SUV of 1.1 Fissural nodularity in the LEFT mid chest (image 93/3) 11 mm with an X min SUV of 5.6 Incidental CT findings: Normal caliber of the thoracic aorta. Normal heart size. Pericardial thickening and thickening along the LEFT mediastinal border. Is normal caliber of the central pulmonary vessels.  Limited assessment of cardiovascular structures given lack of intravenous contrast. Loculated LEFT effusion of similar volume to previous imaging. ABDOMEN/PELVIS: Heterogeneous uptake in the liver without discrete CT correlate. More focal area of uptake in hepatic subsegment VIII is equivocal with a maximum SUV of 3.6 (image 157/6) this is of uncertain significance given the heterogeneity seen with respect to hepatic uptake in general. No signs of visceral or nodal disease in the abdomen Incidental CT findings: No acute findings relative liver, gallbladder, spleen, pancreas, adrenal glands or kidneys. The urinary bladder is collapsed. No signs of bowel obstruction. Appendix is normal. IUD in place. No intra-abdominal ascites. SKELETON: No focal hypermetabolic activity to suggest skeletal metastasis. Incidental CT findings: none IMPRESSION: Signs of circumferential pleural disease most likely related to metastatic disease from breast   cancer given patient history. This pattern can also be seen in the setting of mesothelioma and is associated with a loculated LEFT pleural effusion. Retrocrural lymph node with definite involvement. Nodules in the RIGHT chest without increased metabolic activity dominant nodule in the RIGHT lower lobe. Suspicious given other findings, attention on follow-up. Equivocal retropectoral and supraclavicular lymph nodes, attention on follow-up. Heterogeneous uptake pattern in the liver. Slightly more pronounced area in the posterior RIGHT hemi liver is equivocal. Could consider hepatic MRI for further assessment with and without contrast. Eovist contrast is often helpful in this setting. Electronically Signed   By: Zetta Bills M.D.   On: 07/19/2020 15:37   DG CHEST PORT 1 VIEW  Result Date: 08/11/2020 CLINICAL DATA:  Shortness of breath.  Malignant pleural effusion. EXAM: PORTABLE CHEST 1 VIEW COMPARISON:  08/09/2020 FINDINGS: The LEFT pleural catheter has been removed. A LEFT pleural  effusion appears relatively unchanged. Increasing LEFT mid and lower lung opacities noted which may represent atelectasis and/or infection. There is no evidence of pneumothorax. The LEFT lung is clear. No acute bony abnormalities are present. IMPRESSION: Increasing LEFT mid and lower lung opacities which may represent atelectasis and/or infection. LEFT pleural catheter removal without significant change of LEFT pleural effusion. Electronically Signed   By: Margarette Canada M.D.   On: 08/11/2020 11:27   DG CHEST PORT 1 VIEW  Result Date: 07/30/2020 CLINICAL DATA:  Chest tube. EXAM: PORTABLE CHEST 1 VIEW COMPARISON:  07/29/2020. FINDINGS: Left chest tubes in stable position. No pneumothorax. Stable mild left pleural thickening/pleural effusion. Low lung volumes with persistent bibasilar atelectasis. Stable cardiomegaly. No acute bony abnormality. IMPRESSION: 1. Left chest tube in stable position. No pneumothorax. Stable mild left pleural thickening/pleural effusion. 2.  Low lung volumes with persistent bibasilar atelectasis. 3.  Stable cardiomegaly. Electronically Signed   By: Marcello Moores  Register   On: 07/30/2020 07:38   DG Chest Port 1 View  Result Date: 07/29/2020 CLINICAL DATA:  Status post pleural drainage catheter placement. EXAM: PORTABLE CHEST 1 VIEW COMPARISON:  Same day. FINDINGS: Stable cardiomegaly. Interval placement of left-sided chest tube. Left pleural effusion is significantly smaller. Right lung is clear. IMPRESSION: Left pleural effusion is significantly smaller status post left-sided chest tube placement. Electronically Signed   By: Marijo Conception M.D.   On: 07/29/2020 11:55   DG Chest Port 1 View  Result Date: 07/19/2020 CLINICAL DATA:  Post left-sided thoracentesis. EXAM: PORTABLE CHEST 1 VIEW COMPARISON:  07/01/2020; chest CT-07/03/2020; PET-CT-07/18/2020 FINDINGS: Grossly unchanged cardiac silhouette and mediastinal contours. Interval reduction in persistent moderate-sized partially  loculated left-sided effusion post thoracentesis. No pneumothorax. Residual left basilar heterogeneous/consolidative opacities, unchanged. The right hemithorax remains well aerated. No new focal airspace opacities. No evidence of edema. No acute osseous abnormalities. Surgical clips overlie the upper outer quadrant of the right breast. IMPRESSION: 1. Slight reduction in persistent moderate sized partially loculated left-sided effusion post thoracentesis. No pneumothorax. 2. Unchanged left basilar heterogeneous/consolidative opacities, likely atelectasis. Electronically Signed   By: Sandi Mariscal M.D.   On: 07/19/2020 15:20   US THORACENTESIS ASP PLEURAL SPACE W/IMG GUIDE  Result Date: 07/19/2020 INDICATION: Recurrent symptomatic left-sided pleural effusion. Please perform ultrasound-guided thoracentesis for diagnostic and therapeutic purposes. EXAM: US THORACENTESIS ASP PLEURAL SPACE W/IMG GUIDE COMPARISON:  PET-CT-07/18/2020; chest CT-07/03/2020; ultrasound-guided left-sided thoracentesis-07/03/2020 (yielding 700 cc of pleural fluid). MEDICATIONS: None. COMPLICATIONS: None immediate. TECHNIQUE: Informed written consent was obtained from the patient after a discussion of the risks, benefits and alternatives to treatment. A timeout was  performed prior to the initiation of the procedure. Initial ultrasound scanning demonstrates a moderate sized recurrent left-sided pleural effusion the basilar component of which is noted to be extensively loculated. An adequate percutaneous window was identified at the level of the left mid lateral chest and the procedure was planned. The chest was prepped and draped in the usual sterile fashion. 1% lidocaine was used for local anesthesia. An ultrasound image was saved for documentation purposes. An 8 Fr Safe-T-Centesis catheter was introduced. The thoracentesis was performed. The catheter was removed and a dressing was applied. The patient tolerated the procedure well without  immediate post procedural complication. The patient was escorted to have an upright chest radiograph. FINDINGS: A total of approximately 275 cc of serous fluid was removed. Requested samples were sent to the laboratory. IMPRESSION: Successful ultrasound-guided left sided thoracentesis yielding 275 cc of pleural fluid. Note, an incomplete thoracentesis was performed secondary to extensive loculations within the pleural fluid. Electronically Signed   By: John  Watts M.D.   On: 07/19/2020 15:35    --- Procedure: Pleurx catheter removal by CTS on 8/6  Subjective: Pt feels well overall but has a headache this AM and a dry cough that started yesterday.  No fever/chills.  Pain controlled.       Discharge Exam: Vitals:   08/14/20 2336 08/15/20 0407  BP: (!) 122/58 (!) 141/71  Pulse: (!) 103 100  Resp: 19 20  Temp: 100 F (37.8 C) 98.9 F (37.2 C)  SpO2: 96% 100%   Vitals:   08/14/20 1823 08/14/20 2019 08/14/20 2336 08/15/20 0407  BP:  116/61 (!) 122/58 (!) 141/71  Pulse:  95 (!) 103 100  Resp: 20 16 19 20  Temp:  98.7 F (37.1 C) 100 F (37.8 C) 98.9 F (37.2 C)  TempSrc:  Oral  Oral  SpO2:  97% 96% 100%  Weight:      Height:        General: Pt is alert, awake, not in acute distress Cardiovascular: RRR, S1/S2 +, no rubs, no gallops Respiratory: CTA bilaterally, no wheezing, no rhonchi Abdominal: Soft, NT, ND, bowel sounds + Extremities: no edema, no cyanosis    The results of significant diagnostics from this hospitalization (including imaging, microbiology, ancillary and laboratory) are listed below for reference.     Microbiology: Recent Results (from the past 240 hour(s))  Resp Panel by RT-PCR (Flu A&B, Covid) Nasopharyngeal Swab     Status: None   Collection Time: 08/09/20  6:37 PM   Specimen: Nasopharyngeal Swab; Nasopharyngeal(NP) swabs in vial transport medium  Result Value Ref Range Status   SARS Coronavirus 2 by RT PCR NEGATIVE NEGATIVE Final    Comment:  (NOTE) SARS-CoV-2 target nucleic acids are NOT DETECTED.  The SARS-CoV-2 RNA is generally detectable in upper respiratory specimens during the acute phase of infection. The lowest concentration of SARS-CoV-2 viral copies this assay can detect is 138 copies/mL. A negative result does not preclude SARS-Cov-2 infection and should not be used as the sole basis for treatment or other patient management decisions. A negative result may occur with  improper specimen collection/handling, submission of specimen other than nasopharyngeal swab, presence of viral mutation(s) within the areas targeted by this assay, and inadequate number of viral copies(<138 copies/mL). A negative result must be combined with clinical observations, patient history, and epidemiological information. The expected result is Negative.  Fact Sheet for Patients:  https://www.fda.gov/media/152166/download  Fact Sheet for Healthcare Providers:  https://www.fda.gov/media/152162/download  This test is no t yet   approved or cleared by the [place] FDA and  has been authorized for detection and/or diagnosis of SARS-CoV-2 by FDA under an Emergency Use Authorization (EUA). This EUA will remain  in effect (meaning this test can be used) for the duration of the COVID-19 declaration under Section 564(b)(1) of the Act, 21 U.S.C.section 360bbb-3(b)(1), unless the authorization is terminated  or revoked sooner.       Influenza A by PCR NEGATIVE NEGATIVE Final   Influenza B by PCR NEGATIVE NEGATIVE Final    Comment: (NOTE) The Xpert Xpress SARS-CoV-2/FLU/RSV plus assay is intended as an aid in the diagnosis of influenza from Nasopharyngeal swab specimens and should not be used as a sole basis for treatment. Nasal washings and aspirates are unacceptable for Xpert Xpress SARS-CoV-2/FLU/RSV testing.  Fact Sheet for Patients: https://www.fda.gov/media/152166/download  Fact Sheet for Healthcare  Providers: https://www.fda.gov/media/152162/download  This test is not yet approved or cleared by the [place] FDA and has been authorized for detection and/or diagnosis of SARS-CoV-2 by FDA under an Emergency Use Authorization (EUA). This EUA will remain in effect (meaning this test can be used) for the duration of the COVID-19 declaration under Section 564(b)(1) of the Act, 21 U.S.C. section 360bbb-3(b)(1), unless the authorization is terminated or revoked.  Performed at [facility], [address] [place], [place] [address]   Culture, blood (routine x 2)     Status: None (Preliminary result)   Collection Time: 08/10/20 12:40 AM   Specimen: BLOOD LEFT HAND  Result Value Ref Range Status   Specimen Description BLOOD LEFT HAND  Final   Special Requests   Final    BOTTLES DRAWN AEROBIC AND ANAEROBIC Blood Culture results may not be optimal due to an inadequate volume of blood received in culture bottles   Culture   Final    NO GROWTH 4 DAYS Performed at [facility], [address] [place], [place] [address]    Report Status PENDING  Incomplete  Culture, blood (routine x 2)     Status: None (Preliminary result)   Collection Time: 08/10/20  1:20 AM   Specimen: BLOOD LEFT HAND  Result Value Ref Range Status   Specimen Description BLOOD LEFT HAND  Final   Special Requests   Final    BOTTLES DRAWN AEROBIC ONLY Blood Culture results may not be optimal due to an inadequate volume of blood received in culture bottles   Culture   Final    NO GROWTH 4 DAYS Performed at [facility], [address] [place], [place] [address]    Report Status PENDING  Incomplete  Aerobic/Anaerobic Culture w Gram Stain (surgical/deep wound)     Status: None (Preliminary result)   Collection Time: 08/10/20  1:30 PM   Specimen: Pleura  Result Value Ref Range Status   Specimen Description PLEURAL SITE DRAINAGE  Final   Special Requests NONE  Final   Gram Stain   Final     FEW WBC PRESENT,BOTH PMN AND MONONUCLEAR NO ORGANISMS SEEN Performed at [facility], [address] , [place] [address]    Culture   Final    FEW STAPHYLOCOCCUS AUREUS NO ANAEROBES ISOLATED; CULTURE IN PROGRESS FOR 5 DAYS    Report Status PENDING  Incomplete   Organism ID, Bacteria STAPHYLOCOCCUS AUREUS  Final      Susceptibility   Staphylococcus aureus - MIC*    CIPROFLOXACIN <=0.5 SENSITIVE Sensitive     ERYTHROMYCIN <=0.25 SENSITIVE Sensitive       GENTAMICIN <=0.5 SENSITIVE Sensitive     OXACILLIN 0.5 SENSITIVE Sensitive     TETRACYCLINE >=16 RESISTANT Resistant     VANCOMYCIN 1 SENSITIVE Sensitive     TRIMETH/SULFA <=10 SENSITIVE Sensitive     CLINDAMYCIN <=0.25 SENSITIVE Sensitive     RIFAMPIN <=0.5 SENSITIVE Sensitive     Inducible Clindamycin NEGATIVE Sensitive     * FEW STAPHYLOCOCCUS AUREUS     Labs: BNP (last 3 results) No results for input(s): BNP in the last 8760 hours. Basic Metabolic Panel: Recent Labs  Lab 08/10/20 0130 08/11/20 0430 08/12/20 0315 08/13/20 0130 08/14/20 0420  NA 137 138 135 140 136  K 3.7 4.1 4.8 3.1* 4.0  CL 103 104 102 103 101  CO2 26 27 23 32 26  GLUCOSE 149* 99 110* 110* 104*  BUN 6 5* 6 5* 5*  CREATININE 0.56 0.51 0.44 0.50 0.51  CALCIUM 8.8* 8.5* 8.3* 8.7* 8.7*  MG  --   --   --   --  1.9   Liver Function Tests: Recent Labs  Lab 08/09/20 1748 08/10/20 0130  AST 27 15  ALT 14 11  ALKPHOS 54 49  BILITOT 0.6 0.3  PROT 7.4 6.2*  ALBUMIN 3.4* 2.5*   No results for input(s): LIPASE, AMYLASE in the last 168 hours. No results for input(s): AMMONIA in the last 168 hours. CBC: Recent Labs  Lab 08/09/20 1748 08/10/20 0130 08/11/20 0430 08/12/20 0315 08/13/20 0130 08/14/20 0420 08/15/20 0405  WBC 27.2*   < > 17.0* 12.9* 9.2 12.5* 10.7*  NEUTROABS 25.0*  --  14.0* 10.1* 6.4 10.1*  --   HGB 11.9*   < > 8.6* 9.1* 8.6* 8.1* 9.2*  HCT 35.1*   < > 26.5* 28.2* 27.0* 24.9* 28.3*  MCV 92.9   < > 95.3 94.6 95.4  92.9 93.1  PLT 479*   < > 368 412* 429* 458* 493*   < > = values in this interval not displayed.   Cardiac Enzymes: No results for input(s): CKTOTAL, CKMB, CKMBINDEX, TROPONINI in the last 168 hours. BNP: Invalid input(s): POCBNP CBG: No results for input(s): GLUCAP in the last 168 hours. D-Dimer No results for input(s): DDIMER in the last 72 hours. Hgb A1c No results for input(s): HGBA1C in the last 72 hours. Lipid Profile No results for input(s): CHOL, HDL, LDLCALC, TRIG, CHOLHDL, LDLDIRECT in the last 72 hours. Thyroid function studies No results for input(s): TSH, T4TOTAL, T3FREE, THYROIDAB in the last 72 hours.  Invalid input(s): FREET3 Anemia work up No results for input(s): VITAMINB12, FOLATE, FERRITIN, TIBC, IRON, RETICCTPCT in the last 72 hours. Urinalysis    Component Value Date/Time   COLORURINE YELLOW (A) 08/09/2020 1740   APPEARANCEUR CLEAR (A) 08/09/2020 1740   LABSPEC 1.017 08/09/2020 1740   PHURINE 5.0 08/09/2020 1740   GLUCOSEU NEGATIVE 08/09/2020 1740   HGBUR NEGATIVE 08/09/2020 1740   BILIRUBINUR NEGATIVE 08/09/2020 1740   BILIRUBINUR Neg 01/18/2012 1458   KETONESUR NEGATIVE 08/09/2020 1740   PROTEINUR NEGATIVE 08/09/2020 1740   UROBILINOGEN 0.2 01/18/2012 1458   NITRITE NEGATIVE 08/09/2020 1740   LEUKOCYTESUR NEGATIVE 08/09/2020 1740   Sepsis Labs Invalid input(s): PROCALCITONIN,  WBC,  LACTICIDVEN Microbiology Recent Results (from the past 240 hour(s))  Resp Panel by RT-PCR (Flu A&B, Covid) Nasopharyngeal Swab     Status: None   Collection Time: 08/09/20  6:37 PM   Specimen: Nasopharyngeal Swab; Nasopharyngeal(NP) swabs in vial transport medium  Result Value Ref Range Status   SARS Coronavirus   2 by RT PCR NEGATIVE NEGATIVE Final    Comment: (NOTE) SARS-CoV-2 target nucleic acids are NOT DETECTED.  The SARS-CoV-2 RNA is generally detectable in upper respiratory specimens during the acute phase of infection. The lowest concentration of  SARS-CoV-2 viral copies this assay can detect is 138 copies/mL. A negative result does not preclude SARS-Cov-2 infection and should not be used as the sole basis for treatment or other patient management decisions. A negative result may occur with  improper specimen collection/handling, submission of specimen other than nasopharyngeal swab, presence of viral mutation(s) within the areas targeted by this assay, and inadequate number of viral copies(<138 copies/mL). A negative result must be combined with clinical observations, patient history, and epidemiological information. The expected result is Negative.  Fact Sheet for Patients:  EntrepreneurPulse.com.au  Fact Sheet for Healthcare Providers:  IncredibleEmployment.be  This test is no t yet approved or cleared by the [place] FDA and  has been authorized for detection and/or diagnosis of SARS-CoV-2 by FDA under an Emergency Use Authorization (EUA). This EUA will remain  in effect (meaning this test can be used) for the duration of the COVID-19 declaration under Section 564(b)(1) of the Act, 21 U.S.C.section 360bbb-3(b)(1), unless the authorization is terminated  or revoked sooner.       Influenza A by PCR NEGATIVE NEGATIVE Final   Influenza B by PCR NEGATIVE NEGATIVE Final    Comment: (NOTE) The Xpert Xpress SARS-CoV-2/FLU/RSV plus assay is intended as an aid in the diagnosis of influenza from Nasopharyngeal swab specimens and should not be used as a sole basis for treatment. Nasal washings and aspirates are unacceptable for Xpert Xpress SARS-CoV-2/FLU/RSV testing.  Fact Sheet for Patients: EntrepreneurPulse.com.au  Fact Sheet for Healthcare Providers: IncredibleEmployment.be  This test is not yet approved or cleared by the [place] FDA and has been authorized for detection and/or diagnosis of SARS-CoV-2 by FDA under an Emergency Use  Authorization (EUA). This EUA will remain in effect (meaning this test can be used) for the duration of the COVID-19 declaration under Section 564(b)(1) of the Act, 21 U.S.C. section 360bbb-3(b)(1), unless the authorization is terminated or revoked.  Performed at [facility], [address] [place], [place] [address]   Culture, blood (routine x 2)     Status: None (Preliminary result)   Collection Time: 08/10/20 12:40 AM   Specimen: BLOOD LEFT HAND  Result Value Ref Range Status   Specimen Description BLOOD LEFT HAND  Final   Special Requests   Final    BOTTLES DRAWN AEROBIC AND ANAEROBIC Blood Culture results may not be optimal due to an inadequate volume of blood received in culture bottles   Culture   Final    NO GROWTH 4 DAYS Performed at [facility], [place] [address], [place], [place] [address]    Report Status PENDING  Incomplete  Culture, blood (routine x 2)     Status: None (Preliminary result)   Collection Time: 08/10/20  1:20 AM   Specimen: BLOOD LEFT HAND  Result Value Ref Range Status   Specimen Description BLOOD LEFT HAND  Final   Special Requests   Final    BOTTLES DRAWN AEROBIC ONLY Blood Culture results may not be optimal due to an inadequate volume of blood received in culture bottles   Culture   Final    NO GROWTH 4 DAYS Performed at [facility], [place] [address] [place], [place] [address]    Report Status PENDING  Incomplete  Aerobic/Anaerobic Culture w Gram Stain (surgical/deep wound)     Status: None (Preliminary result)   Collection Time: 08/10/20  1:30 PM   Specimen: Pleura  Result Value Ref Range Status   Specimen Description PLEURAL SITE DRAINAGE  Final   Special Requests NONE  Final   Gram Stain   Final    FEW WBC PRESENT,BOTH PMN AND MONONUCLEAR NO ORGANISMS SEEN Performed at [facility], [address] [place], [place] [address]    Culture   Final    FEW STAPHYLOCOCCUS AUREUS NO ANAEROBES ISOLATED; CULTURE IN  PROGRESS FOR 5 DAYS    Report Status PENDING  Incomplete   Organism ID, Bacteria STAPHYLOCOCCUS AUREUS  Final      Susceptibility   Staphylococcus aureus - MIC*    CIPROFLOXACIN <=0.5 SENSITIVE Sensitive     ERYTHROMYCIN <=0.25 SENSITIVE Sensitive     GENTAMICIN <=0.5 SENSITIVE Sensitive     OXACILLIN 0.5 SENSITIVE Sensitive     TETRACYCLINE >=16 RESISTANT Resistant     VANCOMYCIN 1 SENSITIVE Sensitive     TRIMETH/SULFA <=10 SENSITIVE Sensitive     CLINDAMYCIN <=0.25 SENSITIVE Sensitive     RIFAMPIN <=0.5 SENSITIVE Sensitive     Inducible Clindamycin NEGATIVE Sensitive     * FEW STAPHYLOCOCCUS AUREUS     Time coordinating discharge: Over 30 minutes  SIGNED:   Ezekiel Slocumb, DO [facility] 08/15/2020, 8:10 AM   If 7PM-7AM, please contact night-coverage [email or web address]

## 2020-08-15 NOTE — Progress Notes (Signed)
      [address]       ,[place] [id number]             [phone]      2 Days Post-Op Procedure(s) (LRB): IRRIGATION AND DEBRIDEMENT CHEST WALL ABSCESS (N/A) Subjective: Awake and alert, complains of a dry cough and still no BM since admission.    Objective: Vital signs in last 24 hours: Temp:  [97.5 F (36.4 C)-100 F (37.8 C)] 98.9 F (37.2 C) (08/11 0407) Pulse Rate:  [95-103] 100 (08/11 0407) Cardiac Rhythm: Normal sinus rhythm (08/11 0700) Resp:  [16-20] 20 (08/11 0407) BP: (116-141)/(58-71) 141/71 (08/11 0407) SpO2:  [96 %-100 %] 100 % (08/11 0407)    Intake/Output from previous day: No intake/output data recorded. Intake/Output this shift: No intake/output data recorded.  General appearance: awake and alert.  Neurologic: intact Heart: RRR, few pvc's Lungs: having a dry, non-productive cough.  Abdomen: soft, NT. Wound: erythema and induration along the PleurX tunnel have resolved. Dressing changed with 1/4" plain Nu-Gauze. Minimal drainage present.   Lab Results: Recent Labs    08/14/20 0420 08/15/20 0405  WBC 12.5* 10.7*  HGB 8.1* 9.2*  HCT 24.9* 28.3*  PLT 458* 493*    BMET:  Recent Labs    08/13/20 0130 08/14/20 0420  NA 140 136  K 3.1* 4.0  CL 103 101  CO2 32 26  GLUCOSE 110* 104*  BUN 5* 5*  CREATININE 0.50 0.51  CALCIUM 8.7* 8.7*     PT/INR:  No results for input(s): LABPROT, INR in the last 72 hours.  ABG    Component Value Date/Time   PHART 7.435 07/25/2020 0953   HCO3 26.1 07/25/2020 0953   O2SAT 97.0 07/25/2020 0953   CBG (last 3)  No results for input(s): GLUCAP in the last 72 hours.  Assessment/Plan:  -Soft tissue abscess along the PleurX catheter tract presenting with sepsis. Catheter removed on 8/6.  Wound culture shows MSSA. Erythema and induration have resolved, WBC trending down. OK to discharge to home today or oral ABX x 7 days. Her family to assist with daily wound packing to PleurX exit site  with 1/4" plain Nu-Gauze.  Wound care demonstrated to her mother yesterday and she expressed confidence in attending to this at home.  Arranged follow up with our office for Thursday 8/18 at 1pm.   LOS: 6 days    Antony Odea, PA-C [phone] 08/15/2020

## 2020-08-16 ENCOUNTER — Encounter: Payer: Self-pay | Admitting: Oncology

## 2020-08-16 NOTE — Telephone Encounter (Signed)
Confirmed that the referral was received.

## 2020-08-16 NOTE — Progress Notes (Signed)
[place]  [phone] [phone]  Patient Care Team: Dion Body, MD as PCP - General (Family Medicine) Gery Pray, MD as Consulting Physician (Radiation Oncology) Jovita Kussmaul, MD as Consulting Physician (General Surgery) Dillingham, Loel Lofty, DO as Attending Physician (Plastic Surgery) Lloyd Huger, MD as Consulting Physician (Oncology)   Name of the patient: Kaylee Romero  [id number]  1980-04-17   Date of visit: 08/15/20  HPI: Patient is a 40 y.o. female with recurrent metastatic breast cancer. Planned treatment with Ibrance (palbociclib), letrozole, and leuprolide (ovarian suppression).   Reason for Consult: Ibrance (palbociclib) oral chemotherapy education.   PAST MEDICAL HISTORY: Past Medical History:  Diagnosis Date   Anxiety    Family history of breast cancer    HSV (herpes simplex virus) anogenital infection    positive titer only    HEMATOLOGY/ONCOLOGY HISTORY:  Oncology History  Malignant neoplasm of upper-outer quadrant of right breast in female, estrogen receptor positive ([place])  09/29/2018 Initial Diagnosis   Malignant neoplasm of upper-outer quadrant of right breast in female, estrogen receptor positive ([place])   09/2018 -  Anti-estrogen oral therapy   Tamoxifen; held from 12/05/2018-09/06/2019 for surgery   10/05/2018 Cancer Staging   Staging form: Breast, AJCC 8th Edition - Clinical stage from 10/05/2018: Stage IB (cT2, cN0, cM0, G2, ER+, PR+, HER2-) - Signed by Chauncey Cruel, MD on 04/02/2020 Stage prefix: Initial diagnosis Histologic grading system: 3 grade system   10/13/2018 Genetic Testing   Negative genetic testing. No pathogenic variants identified on the [facility] Breast Cancer STAT Panel + Common Hereditary Cancers Panel. The Common Hereditary Cancers Panel offered by [facility] includes sequencing and/or deletion duplication testing of the following 47 genes: APC,  ATM, AXIN2, BARD1, BMPR1A, BRCA1, BRCA2, BRIP1, CDH1, CDKN2A (p14ARF), CDKN2A (p16INK4a), CKD4, CHEK2, CTNNA1, DICER1, EPCAM (Deletion/duplication testing only), GREM1 (promoter region deletion/duplication testing only), KIT, MEN1, MLH1, MSH2, MSH3, MSH6, MUTYH, NBN, NF1, NHTL1, PALB2, PDGFRA, PMS2, POLD1, POLE, PTEN, RAD50, RAD51C, RAD51D, SDHB, SDHC, SDHD, SMAD4, SMARCA4. STK11, TP53, TSC1, TSC2, and VHL.  The following genes were evaluated for sequence changes only: SDHA and HOXB13 c.251G>A variant only. The report date is 10/13/2018.    11/21/2018 Surgery   Right lumpectomy [name]) [phone]): IDC, grade 2, 2.6 cm, with DCIS. Negative margins. 1/4 lymph nodes positive for macrometastasis. ER/PR positive, HER-2 negative.   12/26/2018 - 06/01/2019 Chemotherapy   dexamethasone (DECADRON) 4 MG tablet, 1 of 1 cycle, Start date: 12/05/2018, End date: 03/02/2019  DOXOrubicin (ADRIAMYCIN) chemo injection 114 mg, 60 mg/m2 = 114 mg, Intravenous,  Once, 4 of 4 cycles. Administration: 114 mg (01/05/2019), 114 mg (01/19/2019), 114 mg (02/02/2019), 114 mg (02/15/2019)  palonosetron (ALOXI) injection 0.25 mg, 0.25 mg, Intravenous,  Once, 1 of 1 cycle. Administration: 0.25 mg (01/05/2019)  pegfilgrastim (NEULASTA ONPRO KIT) injection 6 mg, 6 mg, Subcutaneous, Once, 2 of 2 cycles  pegfilgrastim-jmdb (FULPHILA) injection 6 mg, 6 mg, Subcutaneous,  Once, 4 of 4 cycles. Administration: 6 mg (01/07/2019), 6 mg (01/21/2019), 6 mg (02/06/2019), 6 mg (02/17/2019)  cyclophosphamide (CYTOXAN) 1,140 mg in sodium chloride 0.9 % 250 mL chemo infusion, 600 mg/m2 = 1,140 mg, Intravenous,  Once, 4 of 4 cycles. Administration: 1,140 mg (01/05/2019), 1,140 mg (01/19/2019), 1,140 mg (02/02/2019), 1,140 mg (02/15/2019).  PACLitaxel (TAXOL) 150 mg in sodium chloride 0.9 % 250 mL chemo infusion (</= $RemoveBefor'80mg'nABdFbprymSu$ /m2), 80 mg/m2 = 150 mg, Intravenous,  Once, 12 of 12 cycles. Administration: 150 mg (03/02/2019), 150 mg (03/09/2019), 150  mg (03/23/2019), 150  mg (03/30/2019), 150 mg (04/06/2019), 150 mg (04/20/2019), 150 mg (04/27/2019), 150 mg (05/03/2019), 150 mg (05/11/2019), 150 mg (05/18/2019), 150 mg (05/25/2019), 150 mg (06/01/2019)  fosaprepitant (EMEND) 150 mg  dexamethasone (DECADRON) 12 mg in sodium chloride 0.9 % 145 mL IVPB, , Intravenous,  Once, 4 of 4 cycles. Administration:  (01/05/2019),  (01/19/2019),  (02/02/2019),  (02/15/2019).   06/21/2019 - 08/11/2019 Radiation Therapy   The patient initially received a dose of 50.4 Gy in 28 fractions to the breast using whole-breast tangent fields. This was delivered using a 3-D conformal technique. The pt received a boost delivering an additional 16 Gy in 8 fractions using a electron boost with 83meV electrons. The total dose was 66.4 Gy.    11/12/2019 Cancer Staging   Staging form: Breast, AJCC 8th Edition - Pathologic: Stage IB (pT2, pN1a, cM0, G2, ER+, PR+, HER2-)     Oncotype testing   MammaPrint high risk     ALLERGIES:  is allergic to betadine [povidone-iodine].  MEDICATIONS:  Current Outpatient Medications  Medication Sig Dispense Refill   acetaminophen (TYLENOL) 325 MG tablet Take 2 tablets (650 mg total) by mouth every 6 (six) hours as needed for mild pain (or Fever >/= 101).     albuterol (VENTOLIN HFA) 108 (90 Base) MCG/ACT inhaler Inhale 2 puffs into the lungs every 6 (six) hours as needed for wheezing or shortness of breath. 8 g 2   amphetamine-dextroamphetamine (ADDERALL XR) 30 MG 24 hr capsule Take 30 mg by mouth daily.     amphetamine-dextroamphetamine (ADDERALL) 10 MG tablet Take 10 mg by mouth daily in the afternoon.     Ascorbic Acid (VITAMIN C) 500 MG CAPS Take 500 mg by mouth daily.     cefdinir (OMNICEF) 300 MG capsule Take 1 capsule (300 mg total) by mouth 2 (two) times daily for 7 days. 14 capsule 0   HYDROcodone-acetaminophen (NORCO) 10-325 MG tablet Take 1 tablet by mouth every 4 (four) hours as needed for moderate pain. 60 tablet 0   hydrocortisone cream 1 % Apply 1  application topically 2 (two) times daily. 30 g 0   hydrOXYzine (ATARAX/VISTARIL) 10 MG tablet Take 10 mg by mouth at bedtime as needed for anxiety or itching (Sleep).     ibuprofen (ADVIL) 200 MG tablet Take 400 mg by mouth every 6 (six) hours as needed for mild pain.     letrozole (FEMARA) 2.5 MG tablet Take 1 tablet (2.5 mg total) by mouth daily. 90 tablet 3   Multiple Vitamin (MULTIVITAMIN WITH MINERALS) TABS tablet Take 1 tablet by mouth daily.     oxyCODONE (OXY IR/ROXICODONE) 5 MG immediate release tablet Take 1 tablet (5 mg total) by mouth every 4 (four) hours as needed for severe pain or moderate pain. 30 tablet 0   palbociclib (IBRANCE) 125 MG tablet Take 1 tablet (125 mg total) by mouth daily. Take for 21 days on, 7 days off, repeat every 28 days. 21 tablet 0   polyethylene glycol (MIRALAX / GLYCOLAX) 17 g packet Take 17 g by mouth daily. 14 each 0   senna-docusate (SENOKOT-S) 8.6-50 MG tablet Take 1 tablet by mouth 2 (two) times daily. 30 tablet 0   Tetrahydrozoline HCl (VISINE OP) Place 1 drop into both eyes daily as needed (redness).     venlafaxine XR (EFFEXOR-XR) 75 MG 24 hr capsule Take 1 capsule (75 mg total) by mouth at bedtime.     vitamin B-12 (CYANOCOBALAMIN) 1000 MCG tablet Take 1,000  mcg by mouth daily.     No current facility-administered medications for this visit.   Facility-Administered Medications Ordered in Other Visits  Medication Dose Route Frequency Provider Last Rate Last Admin   acetaminophen (TYLENOL) tablet 650 mg  650 mg Oral Q6H PRN Lloyd Huger, MD   650 mg at 08/15/20 1517    VITAL SIGNS: There were no vitals taken for this visit. There were no vitals filed for this visit.  Estimated body mass index is 30.95 kg/m as calculated from the following:   Height as of 08/10/20: $RemoveBe'5\' 4"'jKzUzwGsn$  (1.626 m).   Weight as of an earlier encounter on 08/15/20: 81.8 kg (180 lb 4.8 oz).  LABS: CBC:    Component Value Date/Time   WBC 10.0 08/15/2020 1332   HGB 10.4  (L) 08/15/2020 1332   HGB 13.1 06/01/2019 1340   HCT 32.8 (L) 08/15/2020 1332   PLT 539 (H) 08/15/2020 1332   PLT 322 06/01/2019 1340   MCV 94.0 08/15/2020 1332   NEUTROABS 7.7 08/15/2020 1332   LYMPHSABS 1.2 08/15/2020 1332   MONOABS 0.9 08/15/2020 1332   EOSABS 0.1 08/15/2020 1332   BASOSABS 0.1 08/15/2020 1332   Comprehensive Metabolic Panel:    Component Value Date/Time   NA 136 08/15/2020 1332   K 3.6 08/15/2020 1332   CL 95 (L) 08/15/2020 1332   CO2 32 08/15/2020 1332   BUN 8 08/15/2020 1332   CREATININE 0.62 08/15/2020 1332   CREATININE 0.59 06/01/2019 1340   GLUCOSE 119 (H) 08/15/2020 1332   CALCIUM 8.9 08/15/2020 1332   AST 17 08/15/2020 1332   AST 21 06/01/2019 1340   ALT 12 08/15/2020 1332   ALT 26 06/01/2019 1340   ALKPHOS 57 08/15/2020 1332   BILITOT 0.4 08/15/2020 1332   BILITOT 0.5 06/01/2019 1340   PROT 7.4 08/15/2020 1332   ALBUMIN 3.0 (L) 08/15/2020 1332     Present during today's visit: patient and her brother  Assessment and Plan: Start plan: Patient will start her first cycle of palbociclib today 08/15/20   Patient Education I spoke with patient for overview of new oral chemotherapy medication: Palbociclib for the treatment of recurrent metastatic breast cancer, planned duration until disease progression or unacceptable drug toxicity.   Administration: Counseled patient on administration, dosing, side effects, monitoring, drug-food interactions, safe handling, storage, and disposal. Patient will take 1 tablet (125 mg total) by mouth daily. Take for 21 days on, 7 days off, repeat every 28 days.  Side Effects: Side effects include but not limited to: N/V, fatigue, decreased wbc.    Adherence: After discussion with patient no patient barriers to medication adherence identified.  Reviewed with patient importance of keeping a medication schedule and plan for any missed doses.  Mrs. Scharnhorst voiced understanding and appreciation. All questions  answered. Medication handout provided.  Provided patient with [facility] phone number. Patient knows to call the office with questions or concerns. [facility] will continue to follow.  Patient expressed understanding and was in agreement with this plan. She also understands that She can call clinic at any time with any questions, concerns, or complaints.   Medication Access Issues: No issues, patient fills at [facility] and has medication in hand  Follow-up plan: pharmacy clinic on 1 week  Thank you for allowing me to participate in the care of this patient.   Time Total: 20 mins  Visit consisted of counseling and education on dealing with issues of symptom  management in the setting of serious and potentially life-threatening illness.Greater than 50%  of this time was spent counseling and coordinating care related to the above assessment and plan.  Signed by: Darl Pikes, PharmD, BCPS, BCOP, CPP Hematology/Oncology Clinical Pharmacist Practitioner [facility] [phone]

## 2020-08-16 NOTE — TOC Transition Note (Signed)
Transition of Care (TOC) - CM/SW Discharge Note Marvetta Gibbons RN, BSN Transitions of Care Unit 4E- RN Case Manager See Treatment Team for direct phone #    Patient Details  Name: Kaylee Romero MRN: [id number] Date of Birth: 03/24/1980  Transition of Care [facility]) CM/SW Contact:  Dawayne Patricia, RN Phone Number: 08/16/2020, 12:42 PM   Clinical Narrative:    Pt stable for transition home after neg. COVID test on 8/11. Noted pt with HHRN order- however pt discharged prior to CM seeing pt. Per chart review pt active with [facility] from previous admit.  8/12- call made to [facility] with Alvis Lemmings- confirmed pt was active with them for [facility]- they have already contacted pt this am and will see pt over the weekend for wound care needs.    Final next level of care: [place] Barriers to Discharge: No Barriers Identified   Patient Goals and CMS Choice Patient states their goals for this hospitalization and ongoing recovery are:: return home w/ [facility] CMS Medicare.gov Compare Post Acute Care list provided to:: Patient Choice offered to / list presented to : Patient  Discharge Placement               Home w/ [facility]        Discharge Plan and Services   Discharge Planning Services: CM Consult Post Acute Care Choice: Home Health, Resumption of Svcs/PTA Provider          DME Arranged: N/A DME Agency: NA       HH Arranged: RN [place] Agency: [place] Date [facility] Agency Contacted: 08/16/20 Time [place]: [phone] Representative spoke with at [place]: [place] ([place]) Interventions     Readmission Risk Interventions No flowsheet data found.

## 2020-08-17 ENCOUNTER — Ambulatory Visit
Admission: RE | Admit: 2020-08-17 | Discharge: 2020-08-17 | Disposition: A | Discharge status: Home / Self Care | Payer: BC Managed Care – PPO | Source: Ambulatory Visit | Attending: Oncology | Admitting: Oncology

## 2020-08-17 ENCOUNTER — Other Ambulatory Visit: Payer: Self-pay

## 2020-08-17 DIAGNOSIS — C50411 Malignant neoplasm of upper-outer quadrant of right female breast: Secondary | ICD-10-CM | POA: Insufficient documentation

## 2020-08-17 DIAGNOSIS — Z170 Estrogen receptor positive status [ER+]: Secondary | ICD-10-CM | POA: Insufficient documentation

## 2020-08-17 IMAGING — MR MR HEAD WO/W CM
14 series · 46 of 48 positions shown · IV contrast (gadavist)
Comparison: PET-CT [DATE]

CLINICAL DATA: 39-year-old female with breast cancer.  Staging.

EXAM:
MRI HEAD WITHOUT AND WITH CONTRAST
TECHNIQUE: Multiplanar, multiecho pulse sequences of the brain and surrounding
structures were obtained without and with intravenous contrast.
CONTRAST:  7.5mL GADAVIST GADOBUTROL 1 MMOL/ML IV SOLN

[Series 5: ax dwi_tracew · axial · 3.0mm · 0.65mm/px · z∈[-68,+86]mm · 3 of 48 slices shown]
[im 1/48]
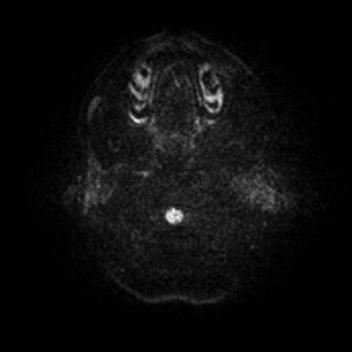
[im 24/48]
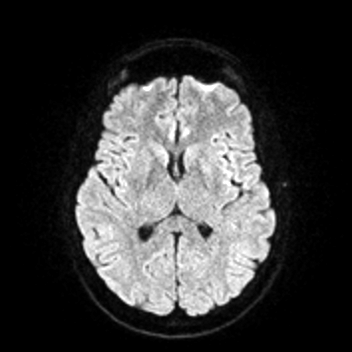
[im 48/48]
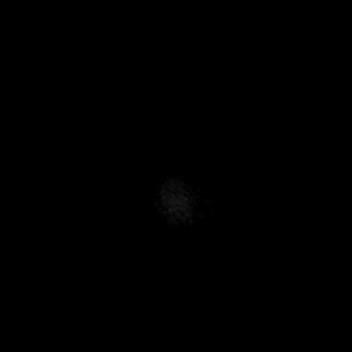

[Series 6: ax dwi_adc · axial · 3.0mm · 0.65mm/px · z∈[-68,+79]mm · 3 of 46 slices shown]
[im 1/46]
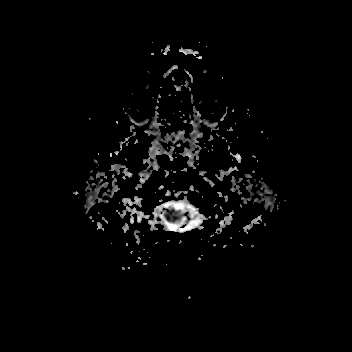
[im 23/46]
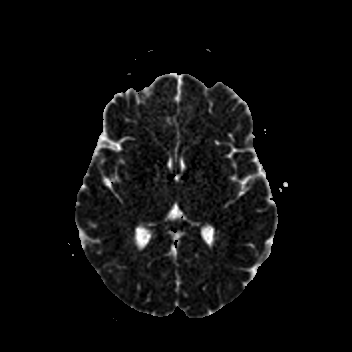
[im 46/46]
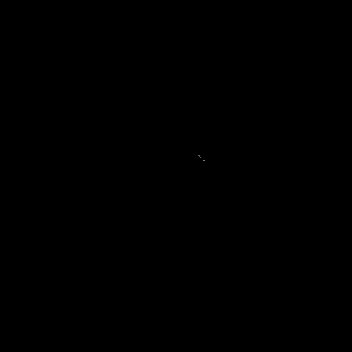

[Series 7: cor dwi_tracew · coronal · 5.0mm · 0.60mm/px · 2 of 38 slices shown]
[im 1/38]
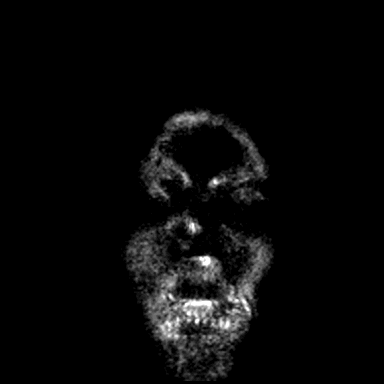
[im 38/38]
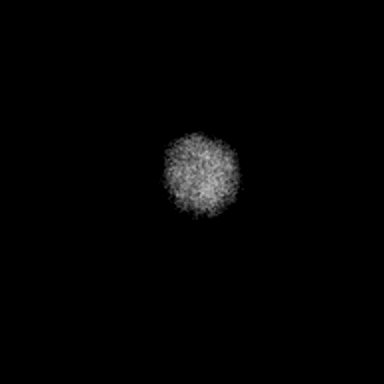

[Series 8: cor dwi_adc · coronal · 5.0mm · 0.60mm/px · 2 of 35 slices shown]
[im 1/35]
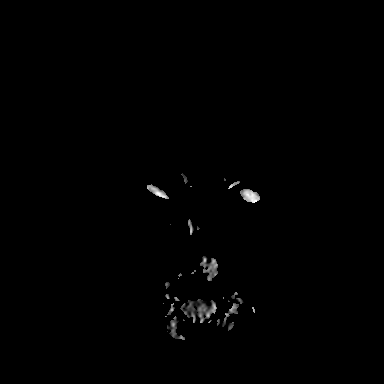
[im 35/35]
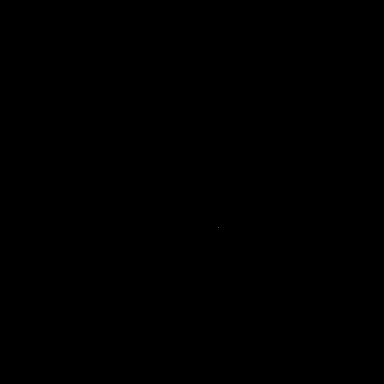

[Series 9: FLAIR · axial · 3.0mm · 0.53mm/px · z∈[-71,+89]mm · 3 of 55 slices shown]
[im 1/55]
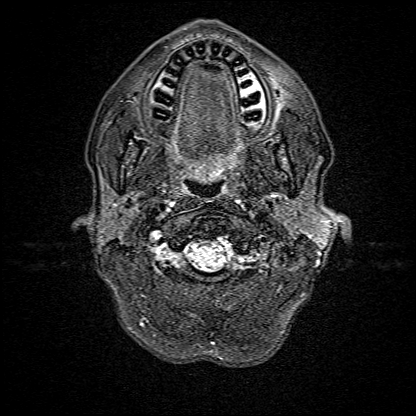
[im 28/55]
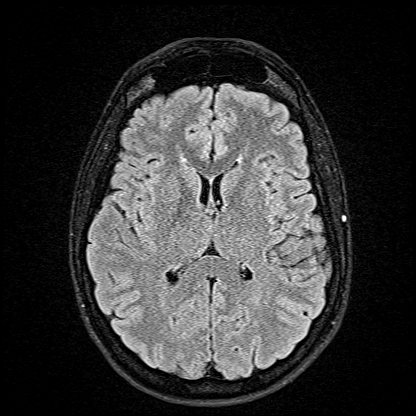
[im 55/55]
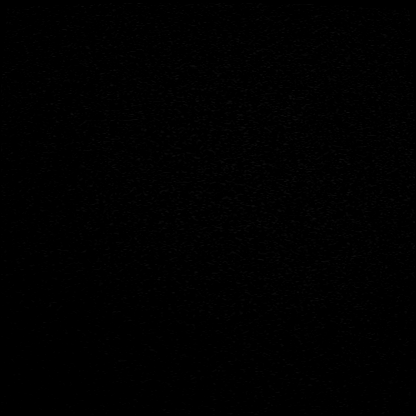

[Series 10: T1 · sagittal · 5.0mm · 0.62mm/px · 1 of 25 slices shown (1 of 2)]
[im 1/25]
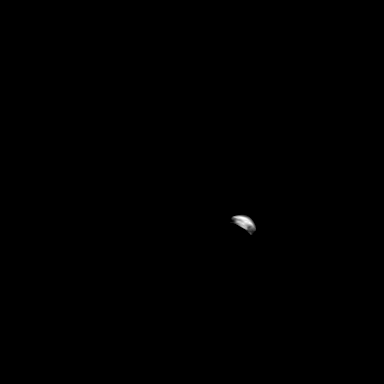

[Series 11: T2 · axial · 5.0mm · 0.53mm/px · z∈[-65,+83]mm · 2 of 26 slices shown (1 of 2)]
[im 1/26]
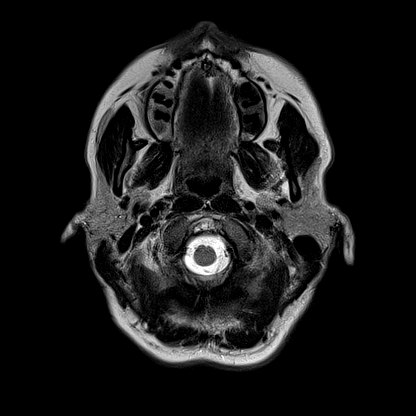
[im 26/26]
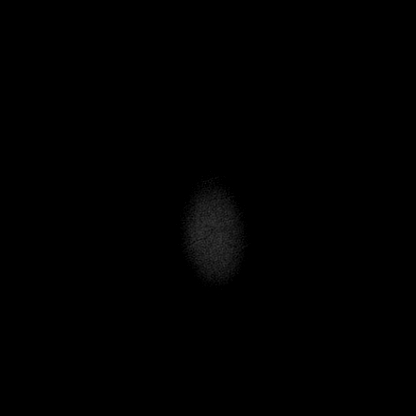

[Series 13: pha_images · axial · 3.0mm · 0.90mm/px · z∈[-79,+81]mm · 3 of 55 slices shown]
[im 1/55]
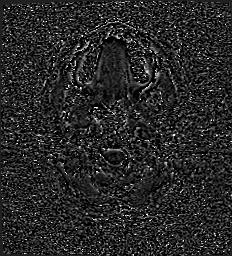
[im 28/55]
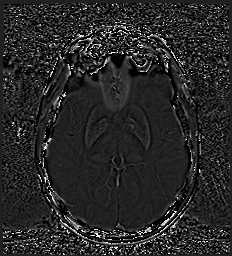
[im 55/55]
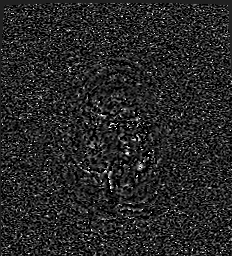

[Series 14: swi_images · axial · 3.0mm · 0.90mm/px · z∈[-79,+96]mm · 4 of 60 slices shown]
[im 1/60]
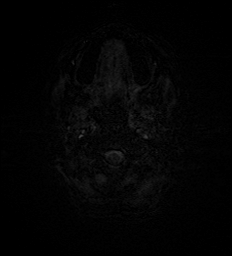
[im 20/60]
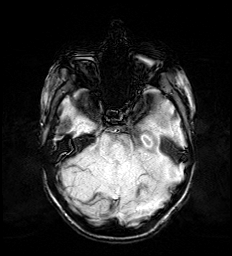
[im 40/60]
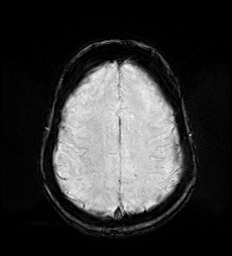
[im 60/60]
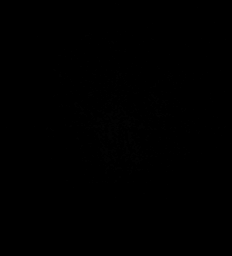

[Series 16: T1 · axial · 1.0mm · 0.98mm/px · z∈[-79,+94]mm · 8 of 172 slices shown (2 of 2)]
[im 1/172]
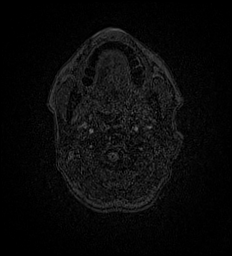
[im 20/172]
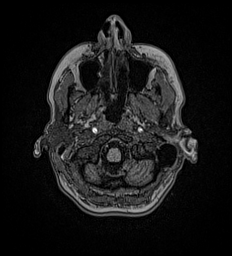
[im 58/172]
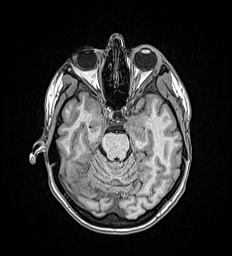
[im 77/172]
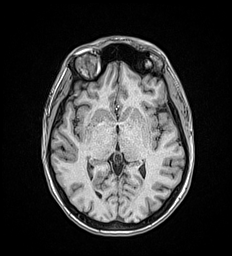
[im 96/172]
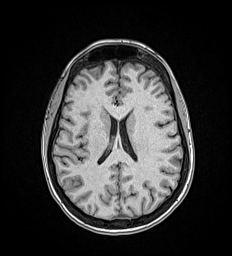
[im 115/172]
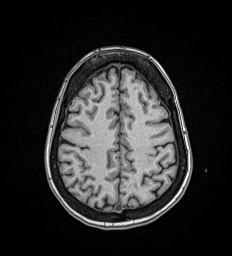
[im 153/172]
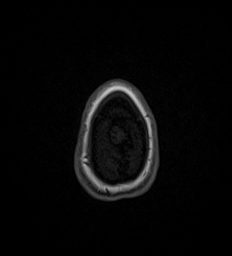
[im 172/172]
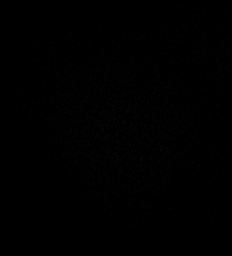

[Series 17: T1 post-contrast · axial · 1.0mm · 0.98mm/px · z∈[-79,+93]mm · 10 of 175 slices shown (1 of 3)]
[im 1/175]
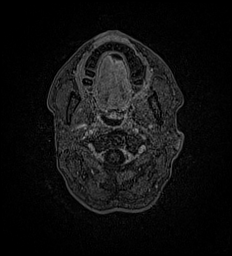
[im 20/175]
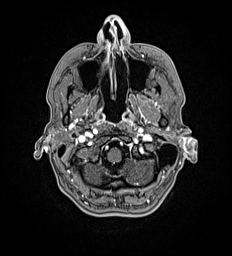
[im 39/175]
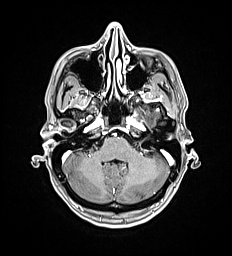
[im 59/175]
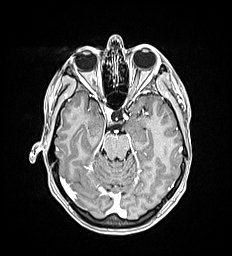
[im 78/175]
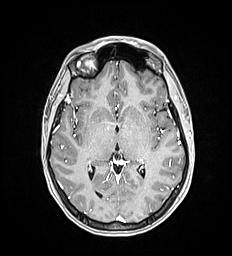
[im 97/175]
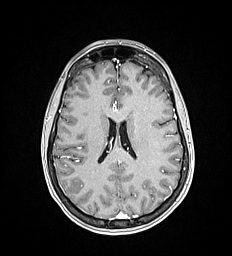
[im 117/175]
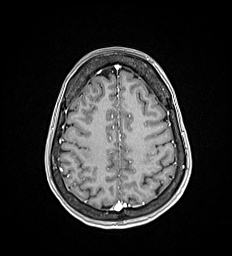
[im 136/175]
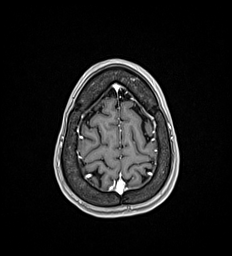
[im 155/175]
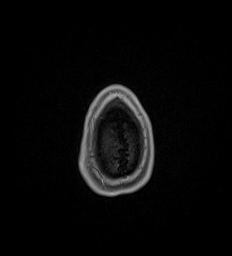
[im 175/175]
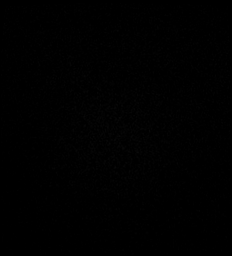

[Series 18: T2 · coronal · 5.0mm · 0.45mm/px · 2 of 31 slices shown (2 of 2)]
[im 1/31]
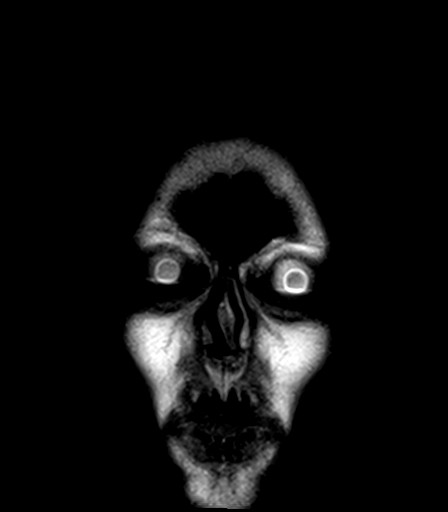
[im 31/31]
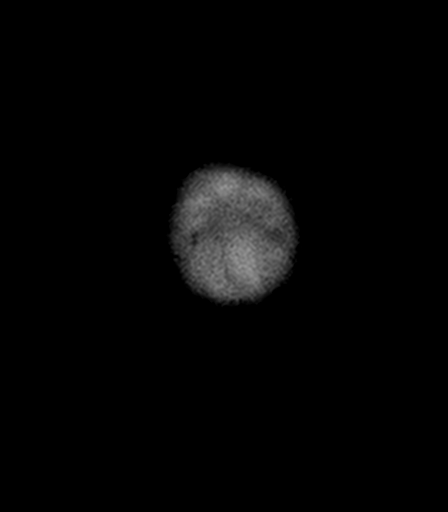

[Series 19: T1 post-contrast · coronal · 5.0mm · 0.57mm/px · 2 of 29 slices shown (2 of 3)]
[im 1/29]
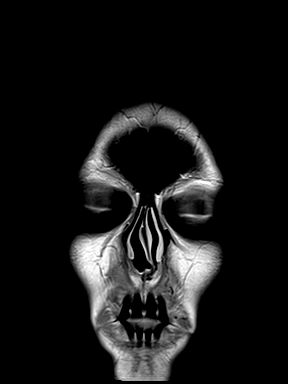
[im 29/29]
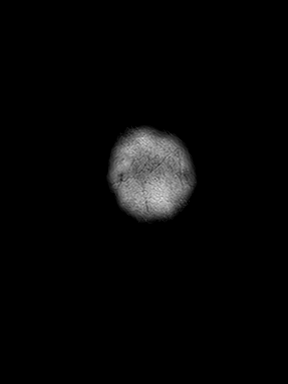

[Series 20: T1 post-contrast · sagittal · 5.0mm · 0.62mm/px · 1 of 25 slices shown (3 of 3)]
[im 1/25]
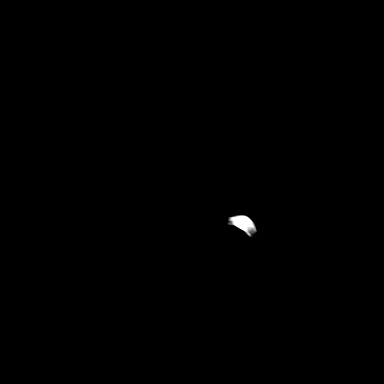

[46 of 48 positions shown; findings below may reference images not displayed]

FINDINGS: Brain: Normal cerebral volume. No restricted diffusion to suggest
acute infarction. No midline shift, mass effect, evidence of mass
lesion, ventriculomegaly, extra-axial collection or acute
intracranial hemorrhage. Cervicomedullary junction and pituitary are
within normal limits.

Cystic change to the pineal gland with no suspicious features,
normal variant.

No abnormal enhancement identified. Small right inferior cerebellar
developmental venous anomaly (normal variant series 19, image 10).
No cerebral edema. Gray and white matter signal is within normal
limits throughout the brain. No dural thickening.

Vascular: Major intracranial vascular flow voids are preserved. The
major dural venous sinuses are enhancing and appear to be patent.

Skull and upper cervical spine: Visualized bone marrow signal is
within normal limits. Negative visible cervical spine.

Sinuses/Orbits: Negative.

Other: Visible internal auditory structures appear normal. Negative
visible scalp and face.
IMPRESSION: Normal MRI appearance of the brain. No metastatic disease
identified.

## 2020-08-17 MED ORDER — GADOBUTROL 1 MMOL/ML IV SOLN
7.5000 mL | Freq: Once | INTRAVENOUS | Status: AC | PRN
  Administered 2020-08-17: 7.5 mL via INTRAVENOUS

## 2020-08-17 NOTE — Progress Notes (Signed)
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  INTERVAL HISTORY: Patient returns to clinic today for repeat laboratory work and assess her toleration of treatment.  She feels significantly improved and nearly back to her baseline.  She continues to have residual cough.  She denies any weakness or fatigue and has lost 14 pounds in the past week secondary to the retained fluid.  She has no neurologic complaints.  She has a good appetite.  She denies any hemoptysis.  She denies any nausea, vomiting, constipation, or diarrhea.  She has no urinary complaints.  Patient offers no further specific complaints today.  REVIEW OF SYSTEMS:   Review of Systems  Constitutional:  Positive for malaise/fatigue. Negative for fever and weight loss.  Respiratory:  Positive for cough. Negative for hemoptysis and shortness of breath.   Cardiovascular: Negative.  Negative for chest pain and leg swelling.  Gastrointestinal: Negative.  Negative for abdominal pain.  Genitourinary: Negative.  Negative for dysuria.  Musculoskeletal: Negative.  Negative for back pain.  Skin: Negative.  Negative for rash.  Neurological: Negative.  Negative for dizziness, focal weakness, weakness and headaches.  Psychiatric/Behavioral: Negative.  The patient is not nervous/anxious.    As per HPI. Otherwise, a complete review of systems is negative.  PAST MEDICAL HISTORY: Past  Medical History:  Diagnosis Date   Anxiety    Family history of breast cancer    HSV (herpes simplex virus) anogenital infection    positive titer only    PAST SURGICAL HISTORY: Past Surgical History:  Procedure Laterality Date   BREAST LUMPECTOMY WITH AXILLARY LYMPH NODE BIOPSY Right 11/21/2018   Procedure: RIGHT BREAST LUMPECTOMY WITH SENTINEL LYMPH NODE BIOPSY;  Surgeon: Jovita Kussmaul, MD;  Location: [place];  Service: General;  Laterality: Right;   BREAST REDUCTION SURGERY Bilateral 11/21/2018   Procedure: BILATERAL MAMMARY REDUCTION  (BREAST);  Surgeon: [place] Going, DO;  Location: [place];  Service: Plastics;  Laterality: Bilateral;   CHEST TUBE INSERTION Left 07/29/2020   Procedure: INSERTION PLEURAL DRAINAGE CATHETER;  Surgeon: Lajuana Matte, MD;  Location: [place];  Service: Thoracic;  Laterality: Left;   IRRIGATION AND DEBRIDEMENT STERNOCLAVICULAR JOINT-STERNUM AND RIBS N/A 08/13/2020   Procedure: IRRIGATION AND DEBRIDEMENT CHEST WALL ABSCESS;  Surgeon: Lajuana Matte, MD;  Location: [facility];  Service: Cardiothoracic;  Laterality: N/A;   PLEURAL BIOPSY Left 07/29/2020   Procedure: PLEURAL BIOPSY;  Surgeon: Lajuana Matte, MD;  Location: [facility];  Service: Thoracic;  Laterality: Left;   PLEURAL EFFUSION DRAINAGE Left 07/29/2020   Procedure: DRAINAGE OF PLEURAL EFFUSION;  Surgeon: Lajuana Matte, MD;  Location: [facility];  Service: Thoracic;  Laterality: Left;   PORT-A-CATH REMOVAL N/A 07/06/2019   Procedure: REMOVAL PORT-A-CATH;  Surgeon: Jovita Kussmaul, MD;  Location: [place];  Service: General;  Laterality: N/A;   PORTACATH PLACEMENT N/A 11/21/2018   Procedure: INSERTION LEFT PORT-A-CATH WITH ULTRASOUND GUIDANCE;  Surgeon: Jovita Kussmaul, MD;  Location: [place] [facility];  Service: General;  Laterality: N/A;  TONSILLECTOMY  1987   TONSILLECTOMY     VIDEO ASSISTED THORACOSCOPY Left 07/29/2020   Procedure: VIDEO ASSISTED THORACOSCOPY;  Surgeon: Lajuana Matte, MD;  Location[facility];  Service: Thoracic;  Laterality: Left;   WISDOM TOOTH EXTRACTION      FAMILY HISTORY: Family History  Problem Relation Age of Onset   Hypertension Father    Alcohol abuse Paternal Grandfather    Heart disease Paternal Grandfather    Alcohol abuse Maternal Grandfather    Heart disease Maternal Grandmother    Breast cancer Other     ADVANCED DIRECTIVES (Y/N):  N  HEALTH MAINTENANCE: Social History   Tobacco Use   Smoking status: Former    Packs/day: 0.50    Years: 20.00    Pack years: 10.00    Types: Cigarettes    Start date: 01/13/2002    Quit date: 10/17/2016    Years since quitting: 3.8   Smokeless tobacco: Never  Vaping Use   Vaping Use: Never used  Substance Use Topics   Alcohol use: Yes    Comment: Socially   Drug use: No     Colonoscopy:  PAP:  Bone density:  Lipid panel:  Allergies  Allergen Reactions   Betadine [Povidone-Iodine]     Current Outpatient Medications  Medication Sig Dispense Refill   acetaminophen (TYLENOL) 325 MG tablet Take 2 tablets (650 mg total) by mouth every 6 (six) hours as needed for mild pain (or Fever >/= 101).     albuterol (VENTOLIN HFA) 108 (90 Base) MCG/ACT inhaler Inhale 2 puffs into the lungs every 6 (six) hours as needed for wheezing or shortness of breath. 8 g 2   amphetamine-dextroamphetamine (ADDERALL XR) 30 MG 24 hr capsule Take 30 mg by mouth daily.     amphetamine-dextroamphetamine (ADDERALL) 10 MG tablet Take 10 mg by mouth daily in the afternoon.     Ascorbic Acid (VITAMIN C) 500 MG CAPS Take 500 mg by mouth daily.     Calcipotriene-Betameth Diprop 0.005-0.064 % CREA Apply topically.     cefdinir (OMNICEF) 300 MG capsule Take 1 capsule (300 mg total) by mouth 2 (two) times daily for 7 days. 14 capsule 0   HYDROcodone-acetaminophen (NORCO) 10-325 MG tablet Take 1 tablet by mouth every 4 (four) hours as needed for moderate pain. 60 tablet 0   hydrocortisone cream 1 % Apply 1  application topically 2 (two) times daily. 30 g 0   hydrOXYzine (ATARAX/VISTARIL) 10 MG tablet Take 10 mg by mouth at bedtime as needed for anxiety or itching (Sleep).     ibuprofen (ADVIL) 200 MG tablet Take 400 mg by mouth every 6 (six) hours as needed for mild pain.     letrozole (FEMARA) 2.5 MG tablet Take 1 tablet (2.5 mg total) by mouth daily. 90 tablet 3   Multiple Vitamin (MULTIVITAMIN WITH MINERALS) TABS tablet Take 1 tablet by mouth daily.     oxyCODONE (OXY IR/ROXICODONE) 5 MG immediate release tablet Take 1 tablet (5 mg total) by mouth every 4 (four) hours as needed for severe pain or moderate pain. 30 tablet 0   palbociclib (IBRANCE) 125 MG tablet Take 1 tablet (125 mg total) by mouth daily. Take for 21 days on, 7 days off, repeat every 28 days. 21 tablet 0   polyethylene glycol (MIRALAX / GLYCOLAX) 17 g packet Take 17 g by mouth daily. 14 each 0   senna-docusate (SENOKOT-S) 8.6-50 MG tablet Take 1 tablet by mouth 2 (two) times daily. [place]  tablet 0   Tetrahydrozoline HCl (VISINE OP) Place 1 drop into both eyes daily as needed (redness).     venlafaxine XR (EFFEXOR-XR) 75 MG 24 hr capsule Take 1 capsule (75 mg total) by mouth at bedtime.     vitamin B-12 (CYANOCOBALAMIN) 1000 MCG tablet Take 1,000 mcg by mouth daily.     No current facility-administered medications for this visit.    OBJECTIVE: Vitals:   08/22/20 1011  BP: 122/79  Pulse: (!) 102  Resp: 20  Temp: 98.1 F (36.7 C)  SpO2: 100%      Body mass index is 28.7 kg/m.    ECOG FS:0 - Asymptomatic  General: Well-developed, well-nourished, no acute distress. Eyes: Pink conjunctiva, anicteric sclera. HEENT: Normocephalic, moist mucous membranes. Lungs: No audible wheezing or coughing. Heart: Regular rate and rhythm. Abdomen: Soft, nontender, no obvious distention. Musculoskeletal: No edema, cyanosis, or clubbing. Neuro: Alert, answering all questions appropriately. Cranial nerves grossly intact. Skin: No rashes or  petechiae noted. Psych: Normal affect.   LAB RESULTS:  Lab Results  Component Value Date   NA 136 08/22/2020   K 4.3 08/22/2020   CL 99 08/22/2020   CO2 27 08/22/2020   GLUCOSE 101 (H) 08/22/2020   BUN 15 08/22/2020   CREATININE 0.66 08/22/2020   CALCIUM 9.4 08/22/2020   PROT 8.6 (H) 08/22/2020   ALBUMIN 3.4 (L) 08/22/2020   AST 18 08/22/2020   ALT 14 08/22/2020   ALKPHOS 49 08/22/2020   BILITOT 0.5 08/22/2020   GFRNONAA >60 08/22/2020   GFRAA >60 08/07/2019    Lab Results  Component Value Date   WBC 8.9 08/22/2020   NEUTROABS 7.3 08/22/2020   HGB 11.0 (L) 08/22/2020   HCT 34.0 (L) 08/22/2020   MCV 91.6 08/22/2020   PLT 655 (H) 08/22/2020     STUDIES: DG Chest 2 View  Result Date: 08/09/2020 CLINICAL DATA:  Chest tube fever EXAM: CHEST - 2 VIEW COMPARISON:  07/30/2020, CT 07/03/2020 FINDINGS: Left lower chest drainage catheter slightly retracted with removal of previously noted additional pleural drainage catheter. Right lung grossly clear. Slightly improved aeration left base with residual left pleural disease and airspace disease at left base. Normal cardiac size. IMPRESSION: Slightly improved aeration at left base compared to prior radiograph. Otherwise no significant change in residual left pleural effusion/disease and mild airspace disease at left base Electronically Signed   By: Donavan Foil M.D.   On: 08/09/2020 18:25   DG Chest 2 View  Result Date: 07/29/2020 CLINICAL DATA:  Preop for pleural effusion EXAM: CHEST - 2 VIEW COMPARISON:  07/22/2020 FINDINGS: Similar degree of moderate left pleural effusion recently evaluated by CT and PET. Normal heart size and mediastinal contours. Postoperative right axilla. IMPRESSION: Unchanged moderate left pleural effusion. Electronically Signed   By: Monte Fantasia M.D.   On: 07/29/2020 06:30   CT Chest W Contrast  Result Date: 08/09/2020 CLINICAL DATA:  Sepsis. Left PleurX in place. Signs of cellulitis at access site.  EXAM: CT CHEST WITH CONTRAST TECHNIQUE: Multidetector CT imaging of the chest was performed during intravenous contrast administration. CONTRAST:  30mL OMNIPAQUE IOHEXOL 350 MG/ML SOLN COMPARISON:  07/03/2020 FINDINGS: Cardiovascular: Heart is normal size. Aorta is normal caliber. Mediastinum/Nodes: No mediastinal, hilar, or axillary adenopathy. Trachea and esophagus are unremarkable. Thyroid unremarkable. Lungs/Pleura: Left PleurX catheter is in place in the left lower hemithorax. Loculated left hydropneumothorax present. Overall, amount of fluid has decreased since prior study. Airspace disease throughout the lingula and left lower lobe, similar  to prior study. Right lung clear. Upper Abdomen: No acute findings Musculoskeletal: There is stranding in the left lateral subcutaneous soft tissues in the lower left chest and upper abdomen/flank region which could reflect cellulitis. No focal fluid collections. No acute bony abnormality. IMPRESSION: PleurX catheter in place on the left with loculated left hydropneumothorax. Overall, the fusion is smaller in size when compared to prior study. Continued areas of consolidation in the lingula and left lower lobe concerning for pneumonia, unchanged. Stranding in the subcutaneous soft tissues in the lateral left lower chest wall and upper abdominal wall/flank compatible with cellulitis. Electronically Signed   By: Rolm Baptise M.D.   On: 08/09/2020 19:24   MR BRAIN W WO CONTRAST  Result Date: 08/18/2020 CLINICAL DATA:  40 year old female with breast cancer.  Staging. EXAM: MRI HEAD WITHOUT AND WITH CONTRAST TECHNIQUE: Multiplanar, multiecho pulse sequences of the brain and surrounding structures were obtained without and with intravenous contrast. CONTRAST:  7.35mL GADAVIST GADOBUTROL 1 MMOL/ML IV SOLN COMPARISON:  PET-CT 07/18/2020 FINDINGS: Brain: Normal cerebral volume. No restricted diffusion to suggest acute infarction. No midline shift, mass effect, evidence of mass  lesion, ventriculomegaly, extra-axial collection or acute intracranial hemorrhage. Cervicomedullary junction and pituitary are within normal limits. Cystic change to the pineal gland with no suspicious features, normal variant. No abnormal enhancement identified. 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 No cerebral edema. Pearline Cables and white matter signal is within normal limits throughout the brain. No dural thickening. Vascular: Major intracranial vascular flow voids are preserved. The major dural venous sinuses are enhancing and appear to be patent. Skull and upper cervical spine: Visualized bone marrow signal is within normal limits. Negative visible cervical spine. Sinuses/Orbits: Negative. Other: Visible internal auditory structures appear normal. Negative visible scalp and face. IMPRESSION: Normal MRI appearance of the brain. No metastatic disease identified. Electronically Signed   By: Genevie Ann M.D.   On: 08/18/2020 06:35   CT CHEST ABDOMEN PELVIS W CONTRAST  Result Date: 08/11/2020 CLINICAL DATA:  Empyema.  Pleural effusion. EXAM: CT CHEST, ABDOMEN, AND PELVIS WITH CONTRAST TECHNIQUE: Multidetector CT imaging of the chest, abdomen and pelvis was performed following the standard protocol during bolus administration of intravenous contrast. CONTRAST:  1105mL OMNIPAQUE IOHEXOL 300 MG/ML  SOLN COMPARISON:  CT chest 08/09/2020 FINDINGS: CT CHEST FINDINGS Cardiovascular: No significant vascular findings. Normal heart size. No pericardial effusion. Mediastinum/Nodes: Small lymph nodes in the thoracic inlet and anterior mediastinum measure less than 1 centimeter in diameter and are likely reactive. Small lymph nodes are also noted at level 1, 2, and 3 of the LEFT axilla, likely reactive. Patient has had previous RIGHT axillary node dissection. The visualized portion of the thyroid gland has a normal appearance. Esophagus is unremarkable. Lungs/Pleura: LEFT  PleurX catheter has been removed. There is persistent pleural thickening and pleural rind with appearance similar to prior studies. Small locules of gas are identified within the pleural space at the LEFT lung base. There is increased ground-glass opacity within the RIGHT UPPER and LOWER lobes, with dense consolidation at the LEFT lung base, increased since prior study. Air bronchograms are seen throughout the LEFT LOWER lobe and LOWER LEFT UPPER lobe. A 4 millimeter nodule is identified in the LATERAL aspect of the RIGHT UPPER lobe (image 62 of series 5). A nodule measures 8 millimeters within the posterior RIGHT LOWER lobe on image 99 of series 5. Musculoskeletal: No chest wall mass or suspicious bone lesions identified. CT ABDOMEN PELVIS  FINDINGS Hepatobiliary: No focal liver abnormality is seen. No radiopaque gallstones, biliary dilatation, or pericholecystic inflammatory changes. Pancreas: Unremarkable. No pancreatic ductal dilatation or surrounding inflammatory changes. Spleen: Normal in size without focal abnormality. Adrenals/Urinary Tract: Adrenal glands are normal. No renal mass or hydronephrosis. Ureters are unremarkable. The bladder and visualized portion of the urethra are normal. Stomach/Bowel: Stomach is distended by a significant debris. Elevated LEFT hemidiaphragm. Small bowel loops are unremarkable. Moderate stool burden. No obstructing colonic mass. Normal appendix. Vascular/Lymphatic: No significant vascular findings are present. No enlarged abdominal or pelvic lymph nodes. Reproductive: Uterus contains intrauterine device, in expected location. No adnexal mass. Small amount of free pelvic fluid is likely physiologic. Other: Body wall edema, LEFT greater than RIGHT. Musculoskeletal: Degenerative changes are identified at L5-S1. IMPRESSION: 1. Increased ground-glass opacity and consolidation in the LEFT lung, consistent with infectious process. 2. Persistent loculated pleural effusion and  thickened pleural rind, consistent with empyema. 3. Stable RIGHT lung nodules, largest 8 millimeters. These warrant follow-up; metastatic disease cannot be excluded. 4. Probably reactive lymph nodes in the thoracic inlet, anterior mediastinum, and LEFT axilla. 5. Status post RIGHT axillary dissection. 6. Significant stool burden. 7. Body wall edema. Electronically Signed   By: Nolon Nations M.D.   On: 08/11/2020 17:06   DG CHEST PORT 1 VIEW  Result Date: 08/11/2020 CLINICAL DATA:  Shortness of breath.  Malignant pleural effusion. EXAM: PORTABLE CHEST 1 VIEW COMPARISON:  08/09/2020 FINDINGS: The LEFT pleural catheter has been removed. A LEFT pleural effusion appears relatively unchanged. Increasing LEFT mid and lower lung opacities noted which may represent atelectasis and/or infection. There is no evidence of pneumothorax. The LEFT lung is clear. No acute bony abnormalities are present. IMPRESSION: Increasing LEFT mid and lower lung opacities which may represent atelectasis and/or infection. LEFT pleural catheter removal without significant change of LEFT pleural effusion. Electronically Signed   By: Margarette Canada M.D.   On: 08/11/2020 11:27   DG CHEST PORT 1 VIEW  Result Date: 07/30/2020 CLINICAL DATA:  Chest tube. EXAM: PORTABLE CHEST 1 VIEW COMPARISON:  07/29/2020. FINDINGS: Left chest tubes in stable position. No pneumothorax. Stable mild left pleural thickening/pleural effusion. Low lung volumes with persistent bibasilar atelectasis. Stable cardiomegaly. No acute bony abnormality. IMPRESSION: 1. Left chest tube in stable position. No pneumothorax. Stable mild left pleural thickening/pleural effusion. 2.  Low lung volumes with persistent bibasilar atelectasis. 3.  Stable cardiomegaly. Electronically Signed   By: Marcello Moores  Register   On: 07/30/2020 07:38   DG Chest Port 1 View  Result Date: 07/29/2020 CLINICAL DATA:  Status post pleural drainage catheter placement. EXAM: PORTABLE CHEST 1 VIEW  COMPARISON:  Same day. FINDINGS: Stable cardiomegaly. Interval placement of left-sided chest tube. Left pleural effusion is significantly smaller. Right lung is clear. IMPRESSION: Left pleural effusion is significantly smaller status post left-sided chest tube placement. Electronically Signed   By: Marijo Conception M.D.   On: 07/29/2020 11:55    ONCOLOGY HISTORY: Patient initially self palpated a lump in the 12 o'clock position of her right breast and subsequently underwent biopsy on September 26, 2018 confirming malignancy.  Initial MammaPrint was reported high risk.  She underwent right lumpectomy on November 21, 2018 which revealed a T2, N1, M0 stage IIa malignancy.  1 of 4 lymph nodes were positive for disease.  She subsequently underwent adjuvant chemotherapy with AC/Taxol completing treatment on Jun 01, 2019.  She then completed adjuvant XRT on August 11, 2019 and was started on tamoxifen.  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  She has noted to have retrocrural lymph node with definite involvement and suspicious retroperitoneal and supraclavicular nodes.  She had equivocal hypermetabolism in her liver.  MRI of the brain on August 18, 2020 was negative for metastatic disease.  Patient also underwent second opinion at [facility] in [place].  Agree with recommendation to proceed with Lupron 3.75 mg every 28 days for up to 24 months for ovarian suppression along with an aromatase inhibitor and Ibrance 125 mg for 21 days with 7 days off.  She has been instructed to discontinue tamoxifen.  Can also consider capecitabine or [place] in the future if  treatment is not tolerated or patient has progression of disease.  Patient initiated treatment last week and is tolerating it well only with some increased fatigue.  Return to clinic in 3 weeks for further evaluation and laboratory work prior to initiation of cycle 2.  Appreciate clinical pharmacy input. Cough: Continue Tussionex, hydrocodone, and albuterol inhaler.  Will get a follow-up chest x-ray today.   Anasarca/fluid retention: Resolved.   Pleural abscess: Treated and drained by thoracic surgery in [place].  Continue antibiotics as prescribed.  Chest x-ray as above. Thrombocytosis: Likely reactive, monitor.    Patient expressed understanding and was in agreement with this plan. She also understands that She can call clinic at any time with any questions, concerns, or complaints.   Cancer Staging Malignant neoplasm of upper-outer quadrant of right breast in female, estrogen receptor positive ([place]) Staging form: Breast, AJCC 8th Edition - Clinical stage from 10/05/2018: Stage IB (cT2, cN0, cM0, G2, ER+, PR+, HER2-) - Signed by Chauncey Cruel, MD on 04/02/2020 Stage prefix: Initial diagnosis Histologic grading system: 3 grade system - Pathologic stage from 08/09/2020: No Stage Recommended (ypT2, pN1a, pM1, G2, ER+, PR+, HER2-) - Signed by Lloyd Huger, MD on 08/09/2020 Stage prefix: Post-therapy Multigene prognostic tests performed: MammaPrint Histologic grading system: 3 grade system   Lloyd Huger, MD   08/22/2020 12:26 PM

## 2020-08-21 ENCOUNTER — Ambulatory Visit (INDEPENDENT_AMBULATORY_CARE_PROVIDER_SITE_OTHER): Payer: BC Managed Care – PPO | Admitting: Dermatology

## 2020-08-21 ENCOUNTER — Telehealth: Payer: Self-pay

## 2020-08-21 ENCOUNTER — Encounter: Payer: Self-pay | Admitting: Dermatology

## 2020-08-21 ENCOUNTER — Other Ambulatory Visit: Payer: Self-pay

## 2020-08-21 DIAGNOSIS — L910 Hypertrophic scar: Secondary | ICD-10-CM | POA: Diagnosis not present

## 2020-08-21 DIAGNOSIS — L920 Granuloma annulare: Secondary | ICD-10-CM

## 2020-08-21 NOTE — Telephone Encounter (Signed)
Patient's FMLA form has been received.  Called patient and sent MyChart message asking patient if she is needing FMLA for continuous or intermittent leave.

## 2020-08-21 NOTE — Progress Notes (Signed)
   Follow-Up Visit   Subjective  Kaylee Romero is a 40 y.o. 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 Patient has been suing tacrolimus 0.1 % ointment at affected areas. States it keeps at minumim but still has some new spots today. ).  The following portions of the chart were reviewed this encounter and updated as appropriate:  Tobacco  Allergies  Meds  Problems  Med Hx  Surg Hx  Fam Hx      Objective  Well appearing patient in no apparent distress; mood and affect are within normal limits.  A focused examination was performed including bilateral hands, left chest. Relevant physical exam findings are noted in the Assessment and Plan.  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 Condition is bothersome to patient. Not currently at goal.  Discussed doing low dose of intralesional kenalog injections or topical steroid to use.   Patient would like to start topical steriod and ilk injections.   Improved s/p ILK injections previously  Continue Tacrolimus 0.1 % ointment apply to affected areas twice daily.   Start sample of wynzora to use once daily for a month  Sdas1 03/2021   Intralesional injection - bilateral hands Location: hands  Informed Consent: Discussed risks (infection, pain, bleeding, bruising, thinning of the skin, loss of skin pigment, lack of resolution, and recurrence of lesion) and benefits of the procedure, as well as the alternatives. Informed consent was obtained. Preparation: The area was prepared a standard fashion.  Procedure Details: An intralesional injection was performed with Kenalog 1.5 mg/cc. 0.1 cc in total were injected.  Total number of injections: 5  Plan: The patient was instructed on post-op care.  Recommend OTC analgesia as needed for pain.   Hypertrophic scar Left Breast  Intralesional injection - Left Breast Location: left chest   Informed Consent: Discussed risks (infection, pain, bleeding, bruising, thinning of the skin, loss of skin pigment, lack of resolution, and recurrence of lesion) and benefits of the procedure, as well as the alternatives. Informed consent was obtained. Preparation: The area was prepared a standard fashion.  Procedure Details: An intralesional injection was performed with Kenalog 5 mg/cc. 0.15 cc in total were injected.  Total number of injections: 3  Plan: The patient was instructed on post-op care. Recommend OTC analgesia as needed for pain.   Return in about 3 months (around 11/21/2020) for follow up . I, Ruthell Rummage, CMA, am acting as scribe for Forest Gleason, MD.  Documentation: I have reviewed the above documentation for accuracy and completeness, and I agree with the above.  Forest Gleason, MD

## 2020-08-21 NOTE — Patient Instructions (Signed)

## 2020-08-22 ENCOUNTER — Encounter: Payer: Self-pay | Admitting: Oncology

## 2020-08-22 ENCOUNTER — Encounter: Payer: Self-pay | Admitting: Pharmacist

## 2020-08-22 ENCOUNTER — Telehealth: Payer: Self-pay | Admitting: *Deleted

## 2020-08-22 ENCOUNTER — Ambulatory Visit (INDEPENDENT_AMBULATORY_CARE_PROVIDER_SITE_OTHER): Payer: Self-pay | Admitting: Physician Assistant

## 2020-08-22 ENCOUNTER — Inpatient Hospital Stay: Payer: BC Managed Care – PPO | Admitting: Pharmacist

## 2020-08-22 ENCOUNTER — Ambulatory Visit
Admission: RE | Admit: 2020-08-22 | Discharge: 2020-08-22 | Disposition: A | Discharge status: Home / Self Care | Payer: BC Managed Care – PPO | Attending: Oncology | Admitting: Oncology

## 2020-08-22 ENCOUNTER — Inpatient Hospital Stay (HOSPITAL_BASED_OUTPATIENT_CLINIC_OR_DEPARTMENT_OTHER): Payer: BC Managed Care – PPO | Admitting: Oncology

## 2020-08-22 ENCOUNTER — Inpatient Hospital Stay: Payer: BC Managed Care – PPO

## 2020-08-22 ENCOUNTER — Ambulatory Visit
Admission: RE | Admit: 2020-08-22 | Discharge: 2020-08-22 | Disposition: A | Discharge status: Home / Self Care | Payer: BC Managed Care – PPO | Source: Ambulatory Visit | Attending: Oncology | Admitting: Oncology

## 2020-08-22 ENCOUNTER — Encounter: Payer: Self-pay | Admitting: Physician Assistant

## 2020-08-22 VITALS — BP 109/77 | HR 100 | Resp 20 | Ht 64.0 in | Wt 168.0 lb

## 2020-08-22 VITALS — BP 122/79 | HR 102 | Temp 98.1°F | Resp 20 | Wt 167.2 lb

## 2020-08-22 DIAGNOSIS — C50411 Malignant neoplasm of upper-outer quadrant of right female breast: Secondary | ICD-10-CM

## 2020-08-22 DIAGNOSIS — Z170 Estrogen receptor positive status [ER+]: Secondary | ICD-10-CM

## 2020-08-22 DIAGNOSIS — J90 Pleural effusion, not elsewhere classified: Secondary | ICD-10-CM

## 2020-08-22 LAB — COMPREHENSIVE METABOLIC PANEL
ALT: 14 U/L (ref 0–44)
AST: 18 U/L (ref 15–41)
Albumin: 3.4 g/dL — ABNORMAL LOW (ref 3.5–5.0)
Alkaline Phosphatase: 49 U/L (ref 38–126)
Anion gap: 10 (ref 5–15)
BUN: 15 mg/dL (ref 6–20)
CO2: 27 mmol/L (ref 22–32)
Calcium: 9.4 mg/dL (ref 8.9–10.3)
Chloride: 99 mmol/L (ref 98–111)
Creatinine, Ser: 0.66 mg/dL (ref 0.44–1.00)
GFR, Estimated: >60 mL/min (ref >60)
Glucose, Bld: 101 mg/dL — ABNORMAL HIGH (ref 70–99)
Potassium: 4.3 mmol/L (ref 3.5–5.1)
Sodium: 136 mmol/L (ref 135–145)
Total Bilirubin: 0.5 mg/dL (ref 0.3–1.2)
Total Protein: 8.6 g/dL — ABNORMAL HIGH (ref 6.5–8.1)

## 2020-08-22 LAB — CBC WITH DIFFERENTIAL/PLATELET
Abs Immature Granulocytes: 0.02 10*3/uL (ref 0.00–0.07)
Basophils Absolute: 0.1 10*3/uL (ref 0.0–0.1)
Basophils Relative: 1 %
Eosinophils Absolute: 0.1 10*3/uL (ref 0.0–0.5)
Eosinophils Relative: 1 %
HCT: 34 % — ABNORMAL LOW (ref 36.0–46.0)
Hemoglobin: 11 g/dL — ABNORMAL LOW (ref 12.0–15.0)
Immature Granulocytes: 0 %
Lymphocytes Relative: 13 %
Lymphs Abs: 1.2 10*3/uL (ref 0.7–4.0)
MCH: 29.6 pg (ref 26.0–34.0)
MCHC: 32.4 g/dL (ref 30.0–36.0)
MCV: 91.6 fL (ref 80.0–100.0)
Monocytes Absolute: 0.3 10*3/uL (ref 0.1–1.0)
Monocytes Relative: 3 %
Neutro Abs: 7.3 10*3/uL (ref 1.7–7.7)
Neutrophils Relative %: 82 %
Platelets: 655 10*3/uL — ABNORMAL HIGH (ref 150–400)
RBC: 3.71 MIL/uL — ABNORMAL LOW (ref 3.87–5.11)
RDW: 12.5 % (ref 11.5–15.5)
Smear Review: INCREASED
WBC: 8.9 10*3/uL (ref 4.0–10.5)
nRBC: 0 % (ref 0.0–0.2)

## 2020-08-22 LAB — MAGNESIUM: Magnesium: 2 mg/dL (ref 1.7–2.4)

## 2020-08-22 IMAGING — CR DG CHEST 2V
1 series · 3 of 3 positions shown · non-contrast
Comparison: [DATE].

CLINICAL DATA: Cough.  History of metastatic breast cancer.

EXAM:
CHEST - 2 VIEW

[Series 1: w chest pa · 0.14mm/px · 3 of 3 slices shown]
[im 1/3]
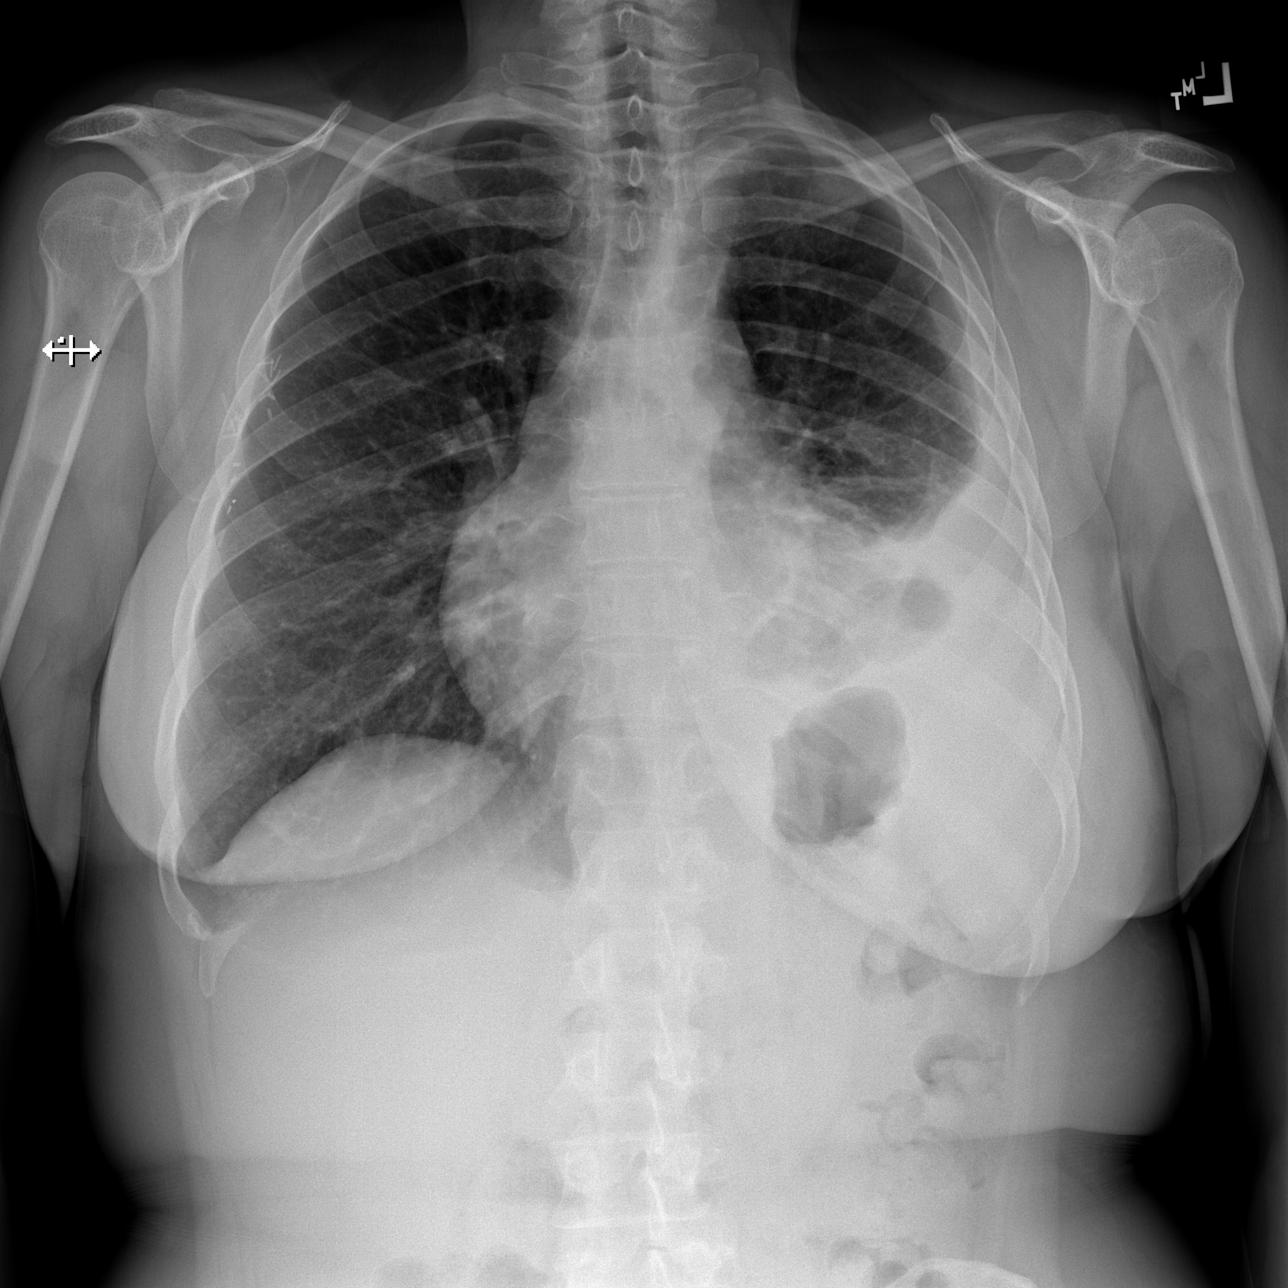
[im 2/3]
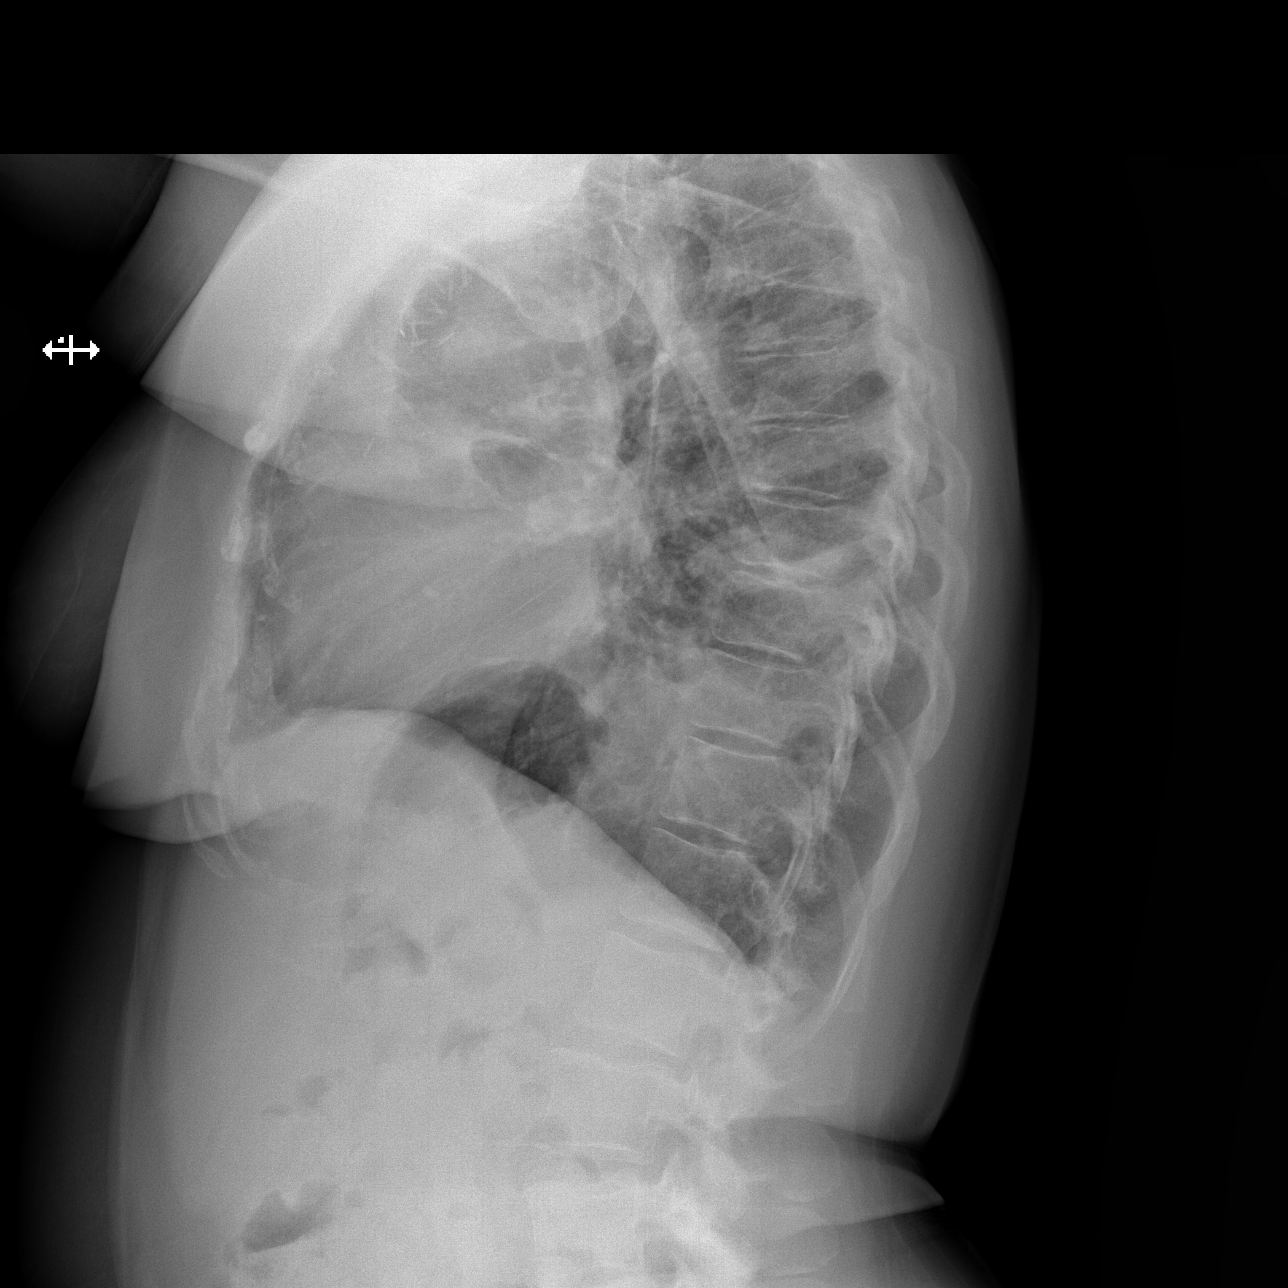
[im 3/3]
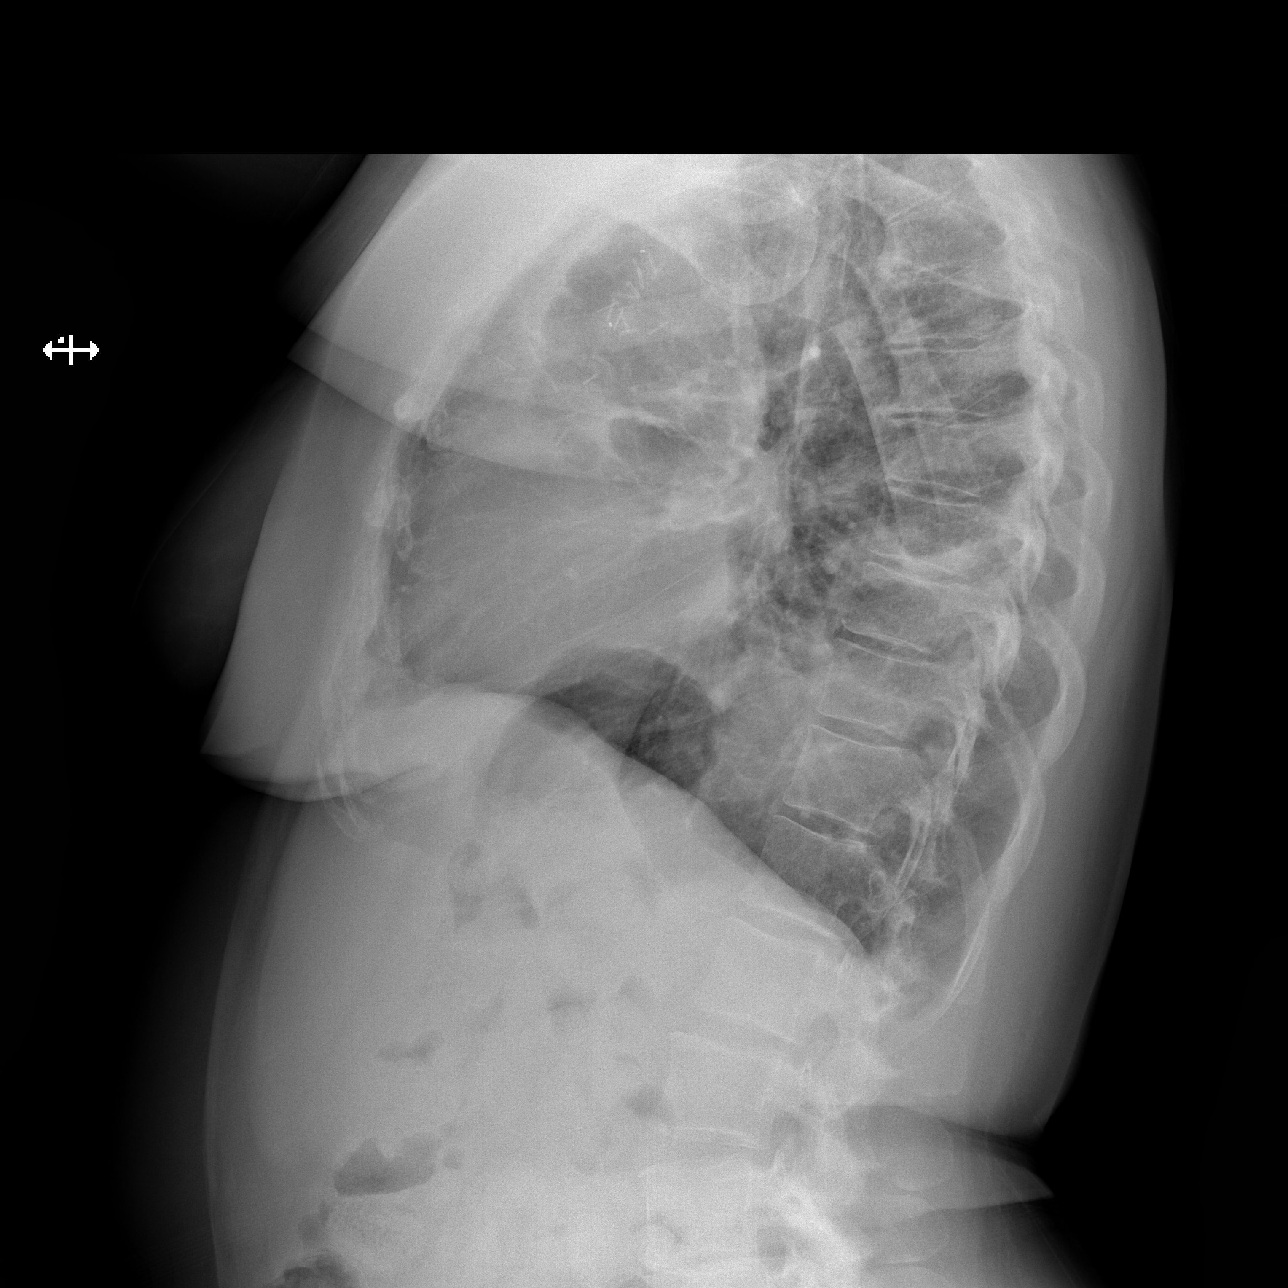

[3 of 3 positions shown; findings below may reference images not displayed]

FINDINGS: Stable cardiomediastinal silhouette. Stable left pleural effusion is
noted with associated left basilar atelectasis. No pneumothorax is
noted. Right lung is clear. Bony thorax is unremarkable.
IMPRESSION: Stable left pleural effusion with associated atelectasis or
infiltrate.

## 2020-08-22 NOTE — Progress Notes (Signed)
[address]       [place] [address]             [phone]      Kaylee Romero is a 40 y.o. female patient s/p soft tissue abscess along the PleurX catheter tract presenting with sepsis. Catheter removed on 8/6.  Wound culture shows MSSA. Erythema and induration have resolved, WBC trending down. She was discharged about a week ago on oral ABX x 7 days. Her family was assisting with daily wound packing to PleurX exit site with 1/4" plain Nu-Gauze.  Today, her wound is well healed without signs of infection. She does have some edema around her original VATs incision but I could not distinguish a clear seroma.       1. Pleural effusion    Past Medical History:  Diagnosis Date   Anxiety    Family history of breast cancer    HSV (herpes simplex virus) anogenital infection    positive titer only   No past surgical history pertinent negatives on file. Scheduled Meds: Current Outpatient Medications on File Prior to Visit  Medication Sig Dispense Refill   acetaminophen (TYLENOL) 325 MG tablet Take 2 tablets (650 mg total) by mouth every 6 (six) hours as needed for mild pain (or Fever >/= 101).     albuterol (VENTOLIN HFA) 108 (90 Base) MCG/ACT inhaler Inhale 2 puffs into the lungs every 6 (six) hours as needed for wheezing or shortness of breath. 8 g 2   amphetamine-dextroamphetamine (ADDERALL XR) 30 MG 24 hr capsule Take 30 mg by mouth daily.     amphetamine-dextroamphetamine (ADDERALL) 10 MG tablet Take 10 mg by mouth daily in the afternoon.     Ascorbic Acid (VITAMIN C) 500 MG CAPS Take 500 mg by mouth daily.     cefdinir (OMNICEF) 300 MG capsule Take 1 capsule (300 mg total) by mouth 2 (two) times daily for 7 days. 14 capsule 0   HYDROcodone-acetaminophen (NORCO) 10-325 MG tablet Take 1 tablet by mouth every 4 (four) hours as needed for moderate pain. 60 tablet 0   hydrocortisone cream 1 % Apply 1 application topically 2 (two) times daily. 30 g 0   hydrOXYzine  (ATARAX/VISTARIL) 10 MG tablet Take 10 mg by mouth at bedtime as needed for anxiety or itching (Sleep).     ibuprofen (ADVIL) 200 MG tablet Take 400 mg by mouth every 6 (six) hours as needed for mild pain.     letrozole (FEMARA) 2.5 MG tablet Take 1 tablet (2.5 mg total) by mouth daily. 90 tablet 3   Multiple Vitamin (MULTIVITAMIN WITH MINERALS) TABS tablet Take 1 tablet by mouth daily.     oxyCODONE (OXY IR/ROXICODONE) 5 MG immediate release tablet Take 1 tablet (5 mg total) by mouth every 4 (four) hours as needed for severe pain or moderate pain. 30 tablet 0   palbociclib (IBRANCE) 125 MG tablet Take 1 tablet (125 mg total) by mouth daily. Take for 21 days on, 7 days off, repeat every 28 days. 21 tablet 0   polyethylene glycol (MIRALAX / GLYCOLAX) 17 g packet Take 17 g by mouth daily. 14 each 0   senna-docusate (SENOKOT-S) 8.6-50 MG tablet Take 1 tablet by mouth 2 (two) times daily. 30 tablet 0   Tetrahydrozoline HCl (VISINE OP) Place 1 drop into both eyes daily as needed (redness).     venlafaxine XR (EFFEXOR-XR) 75 MG 24 hr capsule Take 1 capsule (75 mg total) by  mouth at bedtime.     vitamin B-12 (CYANOCOBALAMIN) 1000 MCG tablet Take 1,000 mcg by mouth daily.     No current facility-administered medications on file prior to visit.     Allergies  Allergen Reactions   Betadine [Povidone-Iodine]    Active Problems:   * No active hospital problems. *  Height '5\' 4"'$  (1.626 m), weight 167 lb (75.8 kg).  Cor: RRR, no murmur Pulm: diminished in the left lower lobe, CTA otherwise Abd: no tenderness Incision: well healed incisions with some edema around her mini thoracotomy incision Ext: no edema, warm and well perfused  Subjective Objective: Vital signs (most recent): Blood pressure 109/77, pulse 100, resp. rate 20, height '5\' 4"'$  (1.626 m), weight 168 lb (76.2 kg), SpO2 97 %. Assessment & Plan  Soft tissue abscess along the pleurx catheter track. ABX x 7 days with daily dressing changes.  There is no need for further dressing changes. Wound has completely healed Malignant pleural effusion-reached out to Dr. Gary Fleet office to arrange thoracentesis. She had been getting these done in [place] before the pleurx catheter placement. She would like to place a pleurx catheter as a last resort due to her complications.  Edema surrounding her VATs incision left upper axillary with no distinct seroma-encouraged to watch the site and if edema worsens to call. This should resolve on its own.  Shortness of breath with activity- this is likely due to her left pleural effusion seen on CXR today.   No medication changes were made today  No routine follow-up scheduled but if she has further issues with her incisions she is to contact our office.   Elgie Collard 08/22/2020

## 2020-08-22 NOTE — Progress Notes (Signed)
Patient states no concerns at the moment. 

## 2020-08-22 NOTE — Telephone Encounter (Signed)
Pt notified of recommendation of chest x-ray via MyChart.

## 2020-08-22 NOTE — Telephone Encounter (Signed)
Intermittent FMLA submitted

## 2020-08-22 NOTE — Telephone Encounter (Signed)
Shaaron Adler NP from [facility] called to report that according to patient's recent x-ray results she does and a pleural effusion and will require thoracentesis.  She would like a call back.

## 2020-08-22 NOTE — Progress Notes (Signed)
[facility]  [phone] [phone]  Patient Care Team: Dion Body, MD as PCP - General (Family Medicine) Gery Pray, MD as Consulting Physician (Radiation Oncology) Jovita Kussmaul, MD as Consulting Physician (General Surgery) Dillingham, Loel Lofty, DO as Attending Physician (Plastic Surgery) Lloyd Huger, MD as Consulting Physician (Oncology)   Name of the patient: Kaylee Romero  [id number]  11/18/1980   Date of visit: 08/22/20  HPI: Patient is a 40 y.o. female with recurrent metastatic breast cancer. Currently treated with Ibrance (palbociclib), letrozole, and leuprolide (ovarian suppression).   Reason for Consult: Oral chemotherapy follow-up for palbociclib therapy.   PAST MEDICAL HISTORY: Past Medical History:  Diagnosis Date   Anxiety    Family history of breast cancer    HSV (herpes simplex virus) anogenital infection    positive titer only    HEMATOLOGY/ONCOLOGY HISTORY:  Oncology History  Malignant neoplasm of upper-outer quadrant of right breast in female, estrogen receptor positive ([place])  09/29/2018 Initial Diagnosis   Malignant neoplasm of upper-outer quadrant of right breast in female, estrogen receptor positive ([place])   09/2018 -  Anti-estrogen oral therapy   Tamoxifen; held from 12/05/2018-09/06/2019 for surgery   10/05/2018 Cancer Staging   Staging form: Breast, AJCC 8th Edition - Clinical stage from 10/05/2018: Stage IB (cT2, cN0, cM0, G2, ER+, PR+, HER2-) - Signed by Chauncey Cruel, MD on 04/02/2020 Stage prefix: Initial diagnosis Histologic grading system: 3 grade system   10/13/2018 Genetic Testing   Negative genetic testing. No pathogenic variants identified on the [facility] Breast Cancer STAT Panel + Common Hereditary Cancers Panel. The Common Hereditary Cancers Panel offered by [facility] includes sequencing and/or deletion duplication testing of the following 47 genes:  APC, ATM, AXIN2, BARD1, BMPR1A, BRCA1, BRCA2, BRIP1, CDH1, CDKN2A (p14ARF), CDKN2A (p16INK4a), CKD4, CHEK2, CTNNA1, DICER1, EPCAM (Deletion/duplication testing only), GREM1 (promoter region deletion/duplication testing only), KIT, MEN1, MLH1, MSH2, MSH3, MSH6, MUTYH, NBN, NF1, NHTL1, PALB2, PDGFRA, PMS2, POLD1, POLE, PTEN, RAD50, RAD51C, RAD51D, SDHB, SDHC, SDHD, SMAD4, SMARCA4. STK11, TP53, TSC1, TSC2, and VHL.  The following genes were evaluated for sequence changes only: SDHA and HOXB13 c.251G>A variant only. The report date is 10/13/2018.    11/21/2018 Surgery   Right lumpectomy [name]) [phone]): IDC, grade 2, 2.6 cm, with DCIS. Negative margins. 1/4 lymph nodes positive for macrometastasis. ER/PR positive, HER-2 negative.   12/26/2018 - 06/01/2019 Chemotherapy   dexamethasone (DECADRON) 4 MG tablet, 1 of 1 cycle, Start date: 12/05/2018, End date: 03/02/2019  DOXOrubicin (ADRIAMYCIN) chemo injection 114 mg, 60 mg/m2 = 114 mg, Intravenous,  Once, 4 of 4 cycles. Administration: 114 mg (01/05/2019), 114 mg (01/19/2019), 114 mg (02/02/2019), 114 mg (02/15/2019)  palonosetron (ALOXI) injection 0.25 mg, 0.25 mg, Intravenous,  Once, 1 of 1 cycle. Administration: 0.25 mg (01/05/2019)  pegfilgrastim (NEULASTA ONPRO KIT) injection 6 mg, 6 mg, Subcutaneous, Once, 2 of 2 cycles  pegfilgrastim-jmdb (FULPHILA) injection 6 mg, 6 mg, Subcutaneous,  Once, 4 of 4 cycles. Administration: 6 mg (01/07/2019), 6 mg (01/21/2019), 6 mg (02/06/2019), 6 mg (02/17/2019)  cyclophosphamide (CYTOXAN) 1,140 mg in sodium chloride 0.9 % 250 mL chemo infusion, 600 mg/m2 = 1,140 mg, Intravenous,  Once, 4 of 4 cycles. Administration: 1,140 mg (01/05/2019), 1,140 mg (01/19/2019), 1,140 mg (02/02/2019), 1,140 mg (02/15/2019).  PACLitaxel (TAXOL) 150 mg in sodium chloride 0.9 % 250 mL chemo infusion (</= 53m/m2), 80 mg/m2 = 150 mg, Intravenous,  Once, 12 of 12 cycles. Administration: 150 mg (03/02/2019), 150 mg (03/09/2019),  150 mg (03/23/2019),  150 mg (03/30/2019), 150 mg (04/06/2019), 150 mg (04/20/2019), 150 mg (04/27/2019), 150 mg (05/03/2019), 150 mg (05/11/2019), 150 mg (05/18/2019), 150 mg (05/25/2019), 150 mg (06/01/2019)  fosaprepitant (EMEND) 150 mg  dexamethasone (DECADRON) 12 mg in sodium chloride 0.9 % 145 mL IVPB, , Intravenous,  Once, 4 of 4 cycles. Administration:  (01/05/2019),  (01/19/2019),  (02/02/2019),  (02/15/2019).   06/21/2019 - 08/11/2019 Radiation Therapy   The patient initially received a dose of 50.4 Gy in 28 fractions to the breast using whole-breast tangent fields. This was delivered using a 3-D conformal technique. The pt received a boost delivering an additional 16 Gy in 8 fractions using a electron boost with 55mV electrons. The total dose was 66.4 Gy.    11/12/2019 Cancer Staging   Staging form: Breast, AJCC 8th Edition - Pathologic: Stage IB (pT2, pN1a, cM0, G2, ER+, PR+, HER2-)     Oncotype testing   MammaPrint high risk     ALLERGIES:  is allergic to betadine [povidone-iodine].  MEDICATIONS:  Current Outpatient Medications  Medication Sig Dispense Refill   acetaminophen (TYLENOL) 325 MG tablet Take 2 tablets (650 mg total) by mouth every 6 (six) hours as needed for mild pain (or Fever >/= 101).     albuterol (VENTOLIN HFA) 108 (90 Base) MCG/ACT inhaler Inhale 2 puffs into the lungs every 6 (six) hours as needed for wheezing or shortness of breath. 8 g 2   amphetamine-dextroamphetamine (ADDERALL XR) 30 MG 24 hr capsule Take 30 mg by mouth daily.     amphetamine-dextroamphetamine (ADDERALL) 10 MG tablet Take 10 mg by mouth daily in the afternoon.     Ascorbic Acid (VITAMIN C) 500 MG CAPS Take 500 mg by mouth daily.     Calcipotriene-Betameth Diprop 0.005-0.064 % CREA Apply topically.     cefdinir (OMNICEF) 300 MG capsule Take 1 capsule (300 mg total) by mouth 2 (two) times daily for 7 days. 14 capsule 0   HYDROcodone-acetaminophen (NORCO) 10-325 MG tablet Take 1 tablet by mouth every 4 (four) hours as  needed for moderate pain. 60 tablet 0   hydrocortisone cream 1 % Apply 1 application topically 2 (two) times daily. 30 g 0   hydrOXYzine (ATARAX/VISTARIL) 10 MG tablet Take 10 mg by mouth at bedtime as needed for anxiety or itching (Sleep).     ibuprofen (ADVIL) 200 MG tablet Take 400 mg by mouth every 6 (six) hours as needed for mild pain.     letrozole (FEMARA) 2.5 MG tablet Take 1 tablet (2.5 mg total) by mouth daily. 90 tablet 3   Multiple Vitamin (MULTIVITAMIN WITH MINERALS) TABS tablet Take 1 tablet by mouth daily.     oxyCODONE (OXY IR/ROXICODONE) 5 MG immediate release tablet Take 1 tablet (5 mg total) by mouth every 4 (four) hours as needed for severe pain or moderate pain. 30 tablet 0   palbociclib (IBRANCE) 125 MG tablet Take 1 tablet (125 mg total) by mouth daily. Take for 21 days on, 7 days off, repeat every 28 days. 21 tablet 0   polyethylene glycol (MIRALAX / GLYCOLAX) 17 g packet Take 17 g by mouth daily. 14 each 0   senna-docusate (SENOKOT-S) 8.6-50 MG tablet Take 1 tablet by mouth 2 (two) times daily. 30 tablet 0   Tetrahydrozoline HCl (VISINE OP) Place 1 drop into both eyes daily as needed (redness).     venlafaxine XR (EFFEXOR-XR) 75 MG 24 hr capsule Take 1 capsule (75 mg total) by mouth at bedtime.  vitamin B-12 (CYANOCOBALAMIN) 1000 MCG tablet Take 1,000 mcg by mouth daily.     No current facility-administered medications for this visit.    VITAL SIGNS: There were no vitals taken for this visit. There were no vitals filed for this visit.  Estimated body mass index is 28.84 kg/m as calculated from the following:   Height as of an earlier encounter on 08/22/20: 5' 4" (1.626 m).   Weight as of an earlier encounter on 08/22/20: 76.2 kg (168 lb).  LABS: CBC:    Component Value Date/Time   WBC 8.9 08/22/2020 0958   HGB 11.0 (L) 08/22/2020 0958   HGB 13.1 06/01/2019 1340   HCT 34.0 (L) 08/22/2020 0958   PLT 655 (H) 08/22/2020 0958   PLT 322 06/01/2019 1340   MCV  91.6 08/22/2020 0958   NEUTROABS 7.3 08/22/2020 0958   LYMPHSABS 1.2 08/22/2020 0958   MONOABS 0.3 08/22/2020 0958   EOSABS 0.1 08/22/2020 0958   BASOSABS 0.1 08/22/2020 0958   Comprehensive Metabolic Panel:    Component Value Date/Time   NA 136 08/22/2020 0958   K 4.3 08/22/2020 0958   CL 99 08/22/2020 0958   CO2 27 08/22/2020 0958   BUN 15 08/22/2020 0958   CREATININE 0.66 08/22/2020 0958   CREATININE 0.59 06/01/2019 1340   GLUCOSE 101 (H) 08/22/2020 0958   CALCIUM 9.4 08/22/2020 0958   AST 18 08/22/2020 0958   AST 21 06/01/2019 1340   ALT 14 08/22/2020 0958   ALT 26 06/01/2019 1340   ALKPHOS 49 08/22/2020 0958   BILITOT 0.5 08/22/2020 0958   BILITOT 0.5 06/01/2019 1340   PROT 8.6 (H) 08/22/2020 0958   ALBUMIN 3.4 (L) 08/22/2020 0958     Present during today's visit: patient only  Assessment and Plan: CBC and CMP reviewed, continue palbociclib 190m 21 days on/7 days off   Oral Chemotherapy Side Effect/Intolerance:  Fatigue: Patient wakes up at about 830 daily, around 2pm she feels tired and has to take a nap. She feels better after the nap,  No reported rash, diarrhea, or mouth sores  Other: Cough: Patient reports a cough that occurs frequently through out the day. When the cough occurs, the did get her a bit of anxiety which worsen then cough.  She has an inhaler to use as needed and find that she is needing it frequently.  She report that hydrocodone is the only thing that seems to help her cough. She does not like need to take the hydrocodone so we discussed trying dextromethorphan (Delsym) OTC cough syrup and using cough drops to help ease the cough. Patient does report some post-nasal drip, suggested starting a non-drowsy antihistamine with the increase in the local pollen count. Also suggested the use of the Calm app to help with the anxiety/depression in general, given the recent suddens changes in her health  Oral Chemotherapy Adherence: no missed doses  reported No patient barriers to medication adherence identified.   New medications: none reported  Medication Access Issues: no issues , [facility] Patient expressed understanding and was in agreement with this plan. She also understands that She can call clinic at any time with any questions, concerns, or complaints.   Follow-up plan: f/u in 3 weeks prior to cycle 2  Thank you for allowing me to participate in the care of this very pleasant patient.   Time Total: 15 mins  Visit consisted of counseling and education on dealing with issues of symptom management in  the setting of serious and potentially life-threatening illness.Greater than 50%  of this time was spent counseling and coordinating care related to the above assessment and plan.  Signed by: Darl Pikes, PharmD, BCPS, Salley Slaughter, CPP Hematology/Oncology Clinical Pharmacist Practitioner [facility] [phone]  08/22/2020 2:25 PM

## 2020-08-26 ENCOUNTER — Encounter: Payer: Self-pay | Admitting: Oncology

## 2020-08-26 NOTE — Telephone Encounter (Signed)
Opened in error

## 2020-08-28 ENCOUNTER — Ambulatory Visit: Payer: BC Managed Care – PPO | Admitting: Radiation Oncology

## 2020-08-28 ENCOUNTER — Telehealth: Payer: Self-pay | Admitting: Pharmacist

## 2020-08-28 NOTE — Telephone Encounter (Signed)
Oral Chemotherapy Student Pharmacist Encounter   I called Ms. Eyerly to provide some follow-up information regarding her concerns about alopecia with her new-start Ibrance. 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   I informed her that there was limited information on the duration and onset of Ibrance-induced alopecia. Resources suggested that alopecia may begin 2-3 weeks after treatment initiation and the frequency is greater within the first 6 months  (16.8%) compared with after 6 months (<5%). In the PALOMA-2 trial, 30% of patients receiving Ibrance plus letrozole had Grade 1 alopecia, defined as <50% hair loss, and 3% had Grade 2 alopecia, indicating that the majority of patients experienced hair thinning rather than complete hair loss. Also recommended patient some ways to reduce further scalp irritation (e.g. mild shampoo, hairpiece to protect scalp from sun and cold air exposure). Patient reported hair washing every other day. Recommended her to decrease the frequency of hair washing to reduce irritation to the scalp.   Overall, she reported tolerating this medication well with some increased fatigue. Encouraged her to continue engaging in daily physical activities and rest as needed.   Ms. Krotzer voiced understanding and appreciation of the provided information.   Wynelle Cleveland, PharmD Candidate [facility] [phone]  08/28/2020 10:21 AM

## 2020-08-30 ENCOUNTER — Telehealth: Payer: BC Managed Care – PPO | Admitting: Thoracic Surgery (Cardiothoracic Vascular Surgery)

## 2020-09-04 ENCOUNTER — Other Ambulatory Visit: Payer: Self-pay | Admitting: Oncology

## 2020-09-04 ENCOUNTER — Other Ambulatory Visit (HOSPITAL_COMMUNITY): Payer: Self-pay

## 2020-09-04 DIAGNOSIS — Z170 Estrogen receptor positive status [ER+]: Secondary | ICD-10-CM

## 2020-09-04 DIAGNOSIS — C50411 Malignant neoplasm of upper-outer quadrant of right female breast: Secondary | ICD-10-CM

## 2020-09-04 MED ORDER — PALBOCICLIB 125 MG PO TABS
125.0000 mg | ORAL_TABLET | Freq: Every day | ORAL | 0 refills | Status: DC
  Filled 2020-09-04: qty 21, 28d supply, fill #0

## 2020-09-05 ENCOUNTER — Other Ambulatory Visit (HOSPITAL_COMMUNITY): Payer: Self-pay

## 2020-09-06 NOTE — Progress Notes (Signed)
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  INTERVAL HISTORY: Patient returns to clinic today for repeat laboratory work and continuation of treatment.  She continues to have occasional cough and shortness of breath.  She has weakness and fatigue, but states this is improving.   She has no neurologic complaints.  She has a good appetite.  She denies any chest pain or hemoptysis, but continues to have intermittent left flank pain.  She denies any nausea, vomiting, constipation, or diarrhea.  She has no urinary complaints.  Patient offers no further specific complaints today.  REVIEW OF SYSTEMS:   Review of Systems  Constitutional:  Positive for malaise/fatigue. Negative for fever and weight loss.  Respiratory:  Positive for cough and shortness of breath. Negative for hemoptysis.   Cardiovascular: Negative.  Negative for chest pain and leg swelling.  Gastrointestinal: Negative.  Negative for abdominal pain.  Genitourinary:  Positive for flank pain. Negative for dysuria.  Musculoskeletal:  Negative for back pain.  Skin: Negative.  Negative for rash.  Neurological: Negative.  Negative for dizziness, focal weakness, weakness and headaches.  Psychiatric/Behavioral: Negative.  The patient is not nervous/anxious.    As per HPI. Otherwise, a complete review of systems is negative.  PAST MEDICAL HISTORY: Past Medical History:  Diagnosis  Date   Anxiety    Family history of breast cancer    HSV (herpes simplex virus) anogenital infection    positive titer only    PAST SURGICAL HISTORY: Past Surgical History:  Procedure Laterality Date   BREAST LUMPECTOMY WITH AXILLARY LYMPH NODE BIOPSY Right 11/21/2018   Procedure: RIGHT BREAST LUMPECTOMY WITH SENTINEL LYMPH NODE BIOPSY;  Surgeon: Jovita Kussmaul, MD;  Location: [facility];  Service: General;  Laterality: Right;   BREAST REDUCTION SURGERY Bilateral 11/21/2018   Procedure: BILATERAL MAMMARY REDUCTION  (BREAST);  Surgeon: Wallace Going, DO;  Location: [place] [facility];  Service: Plastics;  Laterality: Bilateral;   CHEST TUBE INSERTION Left 07/29/2020   Procedure: INSERTION PLEURAL DRAINAGE CATHETER;  Surgeon: Lajuana Matte, MD;  Location: [facility];  Service: Thoracic;  Laterality: Left;   IRRIGATION AND DEBRIDEMENT STERNOCLAVICULAR JOINT-STERNUM AND RIBS N/A 08/13/2020   Procedure: IRRIGATION AND DEBRIDEMENT CHEST WALL ABSCESS;  Surgeon: Lajuana Matte, MD;  Location: [facility];  Service: Cardiothoracic;  Laterality: N/A;   PLEURAL BIOPSY Left 07/29/2020   Procedure: PLEURAL BIOPSY;  Surgeon: Lajuana Matte, MD;  Location: [facility];  Service: Thoracic;  Laterality: Left;   PLEURAL EFFUSION DRAINAGE Left 07/29/2020   Procedure: DRAINAGE OF PLEURAL EFFUSION;  Surgeon: Lajuana Matte, MD;  Location: [facility];  Service: Thoracic;  Laterality: Left;   PORT-A-CATH REMOVAL N/A 07/06/2019   Procedure: REMOVAL PORT-A-CATH;  Surgeon: Jovita Kussmaul, MD;  Location: [facility];  Service: General;  Laterality: N/A;   PORTACATH PLACEMENT N/A 11/21/2018   Procedure: INSERTION LEFT PORT-A-CATH WITH ULTRASOUND GUIDANCE;  Surgeon: Jovita Kussmaul, MD;  Location: [place] [facility];  Service: General;  Laterality: N/A;   [place]  TONSILLECTOMY     VIDEO ASSISTED THORACOSCOPY Left 07/29/2020   Procedure: VIDEO ASSISTED THORACOSCOPY;  Surgeon: Lajuana Matte, MD;  Location[facility];   Service: Thoracic;  Laterality: Left;   WISDOM TOOTH EXTRACTION      FAMILY HISTORY: Family History  Problem Relation Age of Onset   Hypertension Father    Alcohol abuse Paternal Grandfather    Heart disease Paternal Grandfather    Alcohol abuse Maternal Grandfather    Heart disease Maternal Grandmother    Breast cancer Other     ADVANCED DIRECTIVES (Y/N):  N  HEALTH MAINTENANCE: Social History   Tobacco Use   Smoking status: Former    Packs/day: 0.50    Years: 20.00    Pack years: 10.00    Types: Cigarettes    Start date: 01/13/2002    Quit date: 10/17/2016    Years since quitting: 3.9   Smokeless tobacco: Never  Vaping Use   Vaping Use: Never used  Substance Use Topics   Alcohol use: Yes    Comment: Socially   Drug use: No     Colonoscopy:  PAP:  Bone density:  Lipid panel:  Allergies  Allergen Reactions   Betadine [Povidone-Iodine]     Current Outpatient Medications  Medication Sig Dispense Refill   acetaminophen (TYLENOL) 325 MG tablet Take 2 tablets (650 mg total) by mouth every 6 (six) hours as needed for mild pain (or Fever >/= 101).     albuterol (VENTOLIN HFA) 108 (90 Base) MCG/ACT inhaler Inhale 2 puffs into the lungs every 6 (six) hours as needed for wheezing or shortness of breath. 8 g 2   amphetamine-dextroamphetamine (ADDERALL XR) 30 MG 24 hr capsule Take 30 mg by mouth daily.     amphetamine-dextroamphetamine (ADDERALL) 10 MG tablet Take 10 mg by mouth daily in the afternoon.     Ascorbic Acid (VITAMIN C) 500 MG CAPS Take 500 mg by mouth daily.     Calcipotriene-Betameth Diprop 0.005-0.064 % CREA Apply topically.     HYDROcodone-acetaminophen (NORCO) 10-325 MG tablet Take 1-2 tablets by mouth every 4 (four) hours as needed for moderate pain. 60 tablet 0   hydrocortisone cream 1 % Apply 1 application topically 2 (two) times daily. 30 g 0   ibuprofen (ADVIL) 200 MG tablet Take 400 mg by mouth every 6 (six) hours as needed for mild pain.      letrozole (FEMARA) 2.5 MG tablet Take 1 tablet (2.5 mg total) by mouth daily. 90 tablet 3   Multiple Vitamin (MULTIVITAMIN WITH MINERALS) TABS tablet Take 1 tablet by mouth daily.     palbociclib (IBRANCE) 125 MG tablet Take 1 tablet (125 mg total) by mouth daily. Take for 21 days on, 7 days off, repeat every 28 days. 21 tablet 0   polyethylene glycol (MIRALAX / GLYCOLAX) 17 g packet Take 17 g by mouth daily. 14 each 0   Tetrahydrozoline HCl (VISINE OP) Place 1 drop into both eyes daily as needed (redness).     vitamin B-12 (CYANOCOBALAMIN) 1000 MCG tablet Take 1,000 mcg by mouth daily.     hydrOXYzine (VISTARIL) 25 MG capsule Take 1 capsule (25 mg total) by mouth at bedtime. Take as needed for anxiety or sleep. 30 capsule 0   venlafaxine XR (EFFEXOR XR) 150 MG 24 hr capsule Take 1 capsule (150 mg total) by mouth daily with breakfast. 30 capsule 1   No current facility-administered medications for this visit.    OBJECTIVE: Vitals:   09/12/20  1348  BP: 124/87  Pulse: (!) 124  Resp: 16  Temp: 97.6 F (36.4 C)      Body mass index is 28.73 kg/m.    ECOG FS:0 - Asymptomatic  General: Well-developed, well-nourished, no acute distress. Eyes: Pink conjunctiva, anicteric sclera. HEENT: Normocephalic, moist mucous membranes. Lungs: No audible wheezing or coughing. Heart: Regular rate and rhythm. Abdomen: Soft, nontender, no obvious distention. Musculoskeletal: No edema, cyanosis, or clubbing. Neuro: Alert, answering all questions appropriately. Cranial nerves grossly intact. Skin: No rashes or petechiae noted. Psych: Normal affect.   LAB RESULTS:  Lab Results  Component Value Date   NA 136 09/12/2020   K 3.9 09/12/2020   CL 98 09/12/2020   CO2 28 09/12/2020   GLUCOSE 169 (H) 09/12/2020   BUN 10 09/12/2020   CREATININE 0.57 09/12/2020   CALCIUM 10.2 09/12/2020   PROT 8.6 (H) 09/12/2020   ALBUMIN 4.0 09/12/2020   AST 41 09/12/2020   ALT 23 09/12/2020   ALKPHOS 76 09/12/2020    BILITOT 0.3 09/12/2020   GFRNONAA >60 09/12/2020   GFRAA >60 08/07/2019    Lab Results  Component Value Date   WBC 5.3 09/12/2020   NEUTROABS 3.2 09/12/2020   HGB 12.0 09/12/2020   HCT 37.1 09/12/2020   MCV 91.8 09/12/2020   PLT 509 (H) 09/12/2020     STUDIES: DG Chest 2 View  Result Date: 09/13/2020 CLINICAL DATA:  Malignant neoplasm of upper-outer quadrant of right breast in female, estrogen receptor positive. Evaluate pleural fluid. EXAM: CHEST - 2 VIEW COMPARISON:  Chest radiograph 08/22/2020 and chest CT 08/11/2020 FINDINGS: Persistent pleural and parenchymal densities in the left chest. Left chest findings are not significantly changed. Left pleural densities are compatible with loculated left pleural fluid. Surgical clips in the right axilla. No significant airspace disease in the right lung. Heart and mediastinum are stable. No acute bony abnormality. IMPRESSION: Stable appearance of the pleural-parenchymal densities in the left chest. Electronically Signed   By: Markus Daft M.D.   On: 09/13/2020 13:28   DG Chest 2 View  Result Date: 08/23/2020 CLINICAL DATA:  Cough.  History of metastatic breast cancer. EXAM: CHEST - 2 VIEW COMPARISON:  August 11, 2020. FINDINGS: Stable cardiomediastinal silhouette. Stable left pleural effusion is noted with associated left basilar atelectasis. No pneumothorax is noted. Right lung is clear. Bony thorax is unremarkable. IMPRESSION: Stable left pleural effusion with associated atelectasis or infiltrate. Electronically Signed   By: Marijo Conception M.D.   On: 08/23/2020 09:00   MR BRAIN W WO CONTRAST  Result Date: 08/18/2020 CLINICAL DATA:  40 year old female with breast cancer.  Staging. EXAM: MRI HEAD WITHOUT AND WITH CONTRAST TECHNIQUE: Multiplanar, multiecho pulse sequences of the brain and surrounding structures were obtained without and with intravenous contrast. CONTRAST:  7.15mL GADAVIST GADOBUTROL 1 MMOL/ML IV SOLN COMPARISON:  PET-CT  07/18/2020 FINDINGS: Brain: Normal cerebral volume. No restricted diffusion to suggest acute infarction. No midline shift, mass effect, evidence of mass lesion, ventriculomegaly, extra-axial collection or acute intracranial hemorrhage. Cervicomedullary junction and pituitary are within normal limits. Cystic change to the pineal gland with no suspicious features, normal variant. No abnormal enhancement identified. 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 No cerebral edema. Pearline Cables and white matter signal is within normal limits throughout the brain. No dural thickening. Vascular: Major intracranial vascular flow voids are preserved. The major dural venous sinuses are enhancing and appear to be patent. Skull and upper cervical spine: Visualized  bone marrow signal is within normal limits. Negative visible cervical spine. Sinuses/Orbits: Negative. Other: Visible internal auditory structures appear normal. Negative visible scalp and face. IMPRESSION: Normal MRI appearance of the brain. No metastatic disease identified. Electronically Signed   By: Genevie Ann M.D.   On: 08/18/2020 06:35    ONCOLOGY HISTORY: Patient initially self palpated a lump in the 12 o'clock position of her right breast and subsequently underwent biopsy on September 26, 2018 confirming malignancy.  Initial MammaPrint was reported high risk.  She underwent right lumpectomy on November 21, 2018 which revealed a T2, N1, M0 stage IIa malignancy.  1 of 4 lymph nodes were positive for disease.  She subsequently underwent adjuvant chemotherapy with AC/Taxol completing treatment on Jun 01, 2019.  She then completed adjuvant XRT on August 11, 2019 and was started on tamoxifen.  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  PLAN:    Recurrent, stage IV ER/PR positive, HER2 negative invasive carcinoma of the breast with  metastasis to the lung pleura: PET scan results from July 18, 2020 reviewed independently with circumferential pleural disease and a loculated left pleural effusion.  She has noted to have retrocrural lymph node with definite involvement and suspicious retroperitoneal and supraclavicular nodes.  She had equivocal hypermetabolism in her liver.  MRI of the brain on August 18, 2020 was negative for metastatic disease.  Patient also underwent second opinion at [facility] in [place].  Agree with recommendation to proceed with Lupron 3.75 mg every 28 days for up to 24 months for ovarian suppression along with an aromatase inhibitor and Ibrance 125 mg for 21 days with 7 days off.  She has been instructed to discontinue tamoxifen.  Can also consider capecitabine or [place] in the future if treatment is not tolerated or patient has progression of disease.  Proceed with cycle 2 of Ibrance and Lupron.  Continue daily letrozole.  Return to clinic in 4 weeks for further evaluation and consideration of cycle 3.  Appreciate clinical pharmacy input.   Cough: Chronic and unchanged.  Continue Tussionex, hydrocodone, and albuterol inhaler.  Chest x-ray from today revealed stable pleural effusion, patient wishes to attempt repeat thoracentesis to see have her symptoms improve. Anasarca/fluid retention: Resolved.   Pleural abscess: Treated and drained by thoracic surgery in [place].  Resolved.  Chest x-ray and possible thoracentesis as above.   Thrombocytosis: Improving.  Likely reactive, monitor.  Patient expressed understanding and was in agreement with this plan. She also understands that She can call clinic at any time with any questions, concerns, or complaints.    Cancer Staging Malignant neoplasm of upper-outer quadrant of right breast in female, estrogen receptor positive ([place]) Staging form: Breast, AJCC 8th Edition - Clinical stage from 10/05/2018: Stage IB (cT2, cN0, cM0, G2, ER+, PR+, HER2-) - Signed by  Chauncey Cruel, MD on 04/02/2020 Stage prefix: Initial diagnosis Histologic grading system: 3 grade system - Pathologic stage from 08/09/2020: No Stage Recommended (ypT2, pN1a, pM1, G2, ER+, PR+, HER2-) - Signed by Lloyd Huger, MD on 08/09/2020 Stage prefix: Post-therapy Multigene prognostic tests performed: MammaPrint Histologic grading system: 3 grade system   Lloyd Huger, MD   09/14/2020 7:29 AM

## 2020-09-09 ENCOUNTER — Other Ambulatory Visit: Payer: Self-pay | Admitting: Thoracic Surgery (Cardiothoracic Vascular Surgery)

## 2020-09-09 MED ORDER — HYDROCODONE-ACETAMINOPHEN 10-325 MG PO TABS: 1.0000 | ORAL_TABLET | ORAL | 0 refills | Status: DC | PRN

## 2020-09-09 NOTE — Progress Notes (Signed)
     [address]       [place] [address]             [phone]       Patient called complaining of pain at the incision site.  She denies any redness or drainage.  She is asked for refill of the Norco medication.  This was filled.  Also instructed the patient to come into clinic tomorrow to assess the incision to ensure that it is not infected.  If it is not then she should start with warm compresses and massages to the incision.  Shanaiya Bene Bary Leriche

## 2020-09-10 ENCOUNTER — Encounter: Payer: Self-pay | Admitting: Physician Assistant

## 2020-09-10 ENCOUNTER — Ambulatory Visit (INDEPENDENT_AMBULATORY_CARE_PROVIDER_SITE_OTHER): Payer: Self-pay | Admitting: Physician Assistant

## 2020-09-10 ENCOUNTER — Other Ambulatory Visit: Payer: Self-pay

## 2020-09-10 DIAGNOSIS — Z5189 Encounter for other specified aftercare: Secondary | ICD-10-CM

## 2020-09-10 NOTE — Progress Notes (Signed)
[address]       [place] [address]             [phone]      Kaylee Romero is a 40 y.o. female patient s/p soft tissue abscess along the PleurX catheter tract presenting with sepsis. Catheter removed on 8/6.  Wound culture shows MSSA. Erythema and induration have resolved, WBC trending down. She was discharged about a week ago on oral ABX x 7 days. Her family was assisting with daily wound packing to PleurX exit site with 1/4" plain Nu-Gauze.  Today, her wound is well healed without signs of infection. She describes the pain as a soreness especially at night. She states the Norco works during the day but she is up all night in pain and cannot lay on her left side. She also is having some left shoulder pain which she had a heating patch on during the appointment.      1. Pleural effusion    Past Medical History:  Diagnosis Date   Anxiety    Family history of breast cancer    HSV (herpes simplex virus) anogenital infection    positive titer only   No past surgical history pertinent negatives on file. Scheduled Meds: Current Outpatient Medications on File Prior to Visit  Medication Sig Dispense Refill   acetaminophen (TYLENOL) 325 MG tablet Take 2 tablets (650 mg total) by mouth every 6 (six) hours as needed for mild pain (or Fever >/= 101).     albuterol (VENTOLIN HFA) 108 (90 Base) MCG/ACT inhaler Inhale 2 puffs into the lungs every 6 (six) hours as needed for wheezing or shortness of breath. 8 g 2   amphetamine-dextroamphetamine (ADDERALL XR) 30 MG 24 hr capsule Take 30 mg by mouth daily.     amphetamine-dextroamphetamine (ADDERALL) 10 MG tablet Take 10 mg by mouth daily in the afternoon.     Ascorbic Acid (VITAMIN C) 500 MG CAPS Take 500 mg by mouth daily.     Calcipotriene-Betameth Diprop 0.005-0.064 % CREA Apply topically.     HYDROcodone-acetaminophen (NORCO) 10-325 MG tablet Take 1-2 tablets by mouth every 4 (four) hours as needed for moderate pain.  60 tablet 0   hydrocortisone cream 1 % Apply 1 application topically 2 (two) times daily. 30 g 0   hydrOXYzine (ATARAX/VISTARIL) 10 MG tablet Take 10 mg by mouth at bedtime as needed for anxiety or itching (Sleep).     ibuprofen (ADVIL) 200 MG tablet Take 400 mg by mouth every 6 (six) hours as needed for mild pain.     letrozole (FEMARA) 2.5 MG tablet Take 1 tablet (2.5 mg total) by mouth daily. 90 tablet 3   Multiple Vitamin (MULTIVITAMIN WITH MINERALS) TABS tablet Take 1 tablet by mouth daily.     palbociclib (IBRANCE) 125 MG tablet Take 1 tablet (125 mg total) by mouth daily. Take for 21 days on, 7 days off, repeat every 28 days. 21 tablet 0   polyethylene glycol (MIRALAX / GLYCOLAX) 17 g packet Take 17 g by mouth daily. 14 each 0   Tetrahydrozoline HCl (VISINE OP) Place 1 drop into both eyes daily as needed (redness).     venlafaxine XR (EFFEXOR-XR) 75 MG 24 hr capsule Take 1 capsule (75 mg total) by mouth at bedtime.     vitamin B-12 (CYANOCOBALAMIN) 1000 MCG tablet Take 1,000 mcg by mouth daily.     No current facility-administered medications on file prior to visit.  Allergies  Allergen Reactions   Betadine [Povidone-Iodine]    Active Problems:   * No active hospital problems. *  Today's Vitals   09/10/20 1406  BP: 129/86  Pulse: (!) 116  Resp: 20  SpO2: 96%  Weight: 167 lb 3.2 oz (75.8 kg)  Height: '5\' 4"'$  (1.626 m)   Body mass index is 28.7 kg/m.   Cor: RRR, no murmur Pulm: diminished in the left lower lobe, CTA otherwise Abd: no tenderness Incision: well healed incisions, no erythema, no seroma, no drainage Ext: no edema, warm and well perfused  Subjective Objective:   Soft tissue abscess along the pleurx catheter track.  Wound has completely healed Malignant pleural effusion-She had been getting thoracentesis done in [place].  She would like to place a pleurx catheter as a last resort due to her complications. I recommended a CXR when she sees her  oncologist this week. I will follow-up on this as well.   No erythema, drainage or seroma around the incision. There may be a small amount of edema but it is difficult to tell.  Shortness of breath with activity- this is likely due to her left pleural effusion. Recommend thoracentesis.  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 She notices the pain more with deep breathing and at night. No fevers/chills but she does wake up in a sweat at night. She contributes this to her cancer treatment. Advil and Norco have helped a little but not at night. Offered gabapentin but the patient was not interested. She asked for dilaudid to take at night. I will have to discuss with Dr. Kipp Brood since we normally do not prescribe this medication especially 6 weeks out from surgery.   No medication changes were made today. Will discuss additional pain medication with Dr. Kipp Brood.   Follow-up in 1-2 weeks to see if pain has improved.   Elgie Collard 08/22/2020

## 2020-09-12 ENCOUNTER — Inpatient Hospital Stay: Payer: BC Managed Care – PPO | Attending: Oncology

## 2020-09-12 ENCOUNTER — Inpatient Hospital Stay (HOSPITAL_BASED_OUTPATIENT_CLINIC_OR_DEPARTMENT_OTHER): Payer: BC Managed Care – PPO | Admitting: Oncology

## 2020-09-12 ENCOUNTER — Inpatient Hospital Stay: Payer: BC Managed Care – PPO

## 2020-09-12 ENCOUNTER — Inpatient Hospital Stay: Payer: BC Managed Care – PPO | Admitting: Pharmacist

## 2020-09-12 ENCOUNTER — Encounter: Payer: Self-pay | Admitting: Oncology

## 2020-09-12 ENCOUNTER — Ambulatory Visit
Admission: RE | Admit: 2020-09-12 | Discharge: 2020-09-12 | Disposition: A | Discharge status: Home / Self Care | Payer: BC Managed Care – PPO | Source: Ambulatory Visit | Attending: Oncology | Admitting: Oncology

## 2020-09-12 VITALS — BP 124/87 | HR 124 | Temp 97.6°F | Resp 16 | Wt 167.4 lb

## 2020-09-12 DIAGNOSIS — Z5111 Encounter for antineoplastic chemotherapy: Secondary | ICD-10-CM | POA: Insufficient documentation

## 2020-09-12 DIAGNOSIS — Z170 Estrogen receptor positive status [ER+]: Secondary | ICD-10-CM | POA: Insufficient documentation

## 2020-09-12 DIAGNOSIS — C50411 Malignant neoplasm of upper-outer quadrant of right female breast: Secondary | ICD-10-CM | POA: Diagnosis not present

## 2020-09-12 DIAGNOSIS — D75839 Thrombocytosis, unspecified: Secondary | ICD-10-CM | POA: Insufficient documentation

## 2020-09-12 DIAGNOSIS — E663 Overweight: Secondary | ICD-10-CM

## 2020-09-12 DIAGNOSIS — R059 Cough, unspecified: Secondary | ICD-10-CM | POA: Insufficient documentation

## 2020-09-12 DIAGNOSIS — C782 Secondary malignant neoplasm of pleura: Secondary | ICD-10-CM | POA: Insufficient documentation

## 2020-09-12 LAB — COMPREHENSIVE METABOLIC PANEL
ALT: 23 U/L (ref 0–44)
AST: 41 U/L (ref 15–41)
Albumin: 4 g/dL (ref 3.5–5.0)
Alkaline Phosphatase: 76 U/L (ref 38–126)
Anion gap: 10 (ref 5–15)
BUN: 10 mg/dL (ref 6–20)
CO2: 28 mmol/L (ref 22–32)
Calcium: 10.2 mg/dL (ref 8.9–10.3)
Chloride: 98 mmol/L (ref 98–111)
Creatinine, Ser: 0.57 mg/dL (ref 0.44–1.00)
GFR, Estimated: >60 mL/min (ref >60)
Glucose, Bld: 169 mg/dL — ABNORMAL HIGH (ref 70–99)
Potassium: 3.9 mmol/L (ref 3.5–5.1)
Sodium: 136 mmol/L (ref 135–145)
Total Bilirubin: 0.3 mg/dL (ref 0.3–1.2)
Total Protein: 8.6 g/dL — ABNORMAL HIGH (ref 6.5–8.1)

## 2020-09-12 LAB — CBC WITH DIFFERENTIAL/PLATELET
Abs Immature Granulocytes: 0.02 10*3/uL (ref 0.00–0.07)
Basophils Absolute: 0.1 10*3/uL (ref 0.0–0.1)
Basophils Relative: 2 %
Eosinophils Absolute: 0 10*3/uL (ref 0.0–0.5)
Eosinophils Relative: 1 %
HCT: 37.1 % (ref 36.0–46.0)
Hemoglobin: 12 g/dL (ref 12.0–15.0)
Immature Granulocytes: 0 %
Lymphocytes Relative: 27 %
Lymphs Abs: 1.4 10*3/uL (ref 0.7–4.0)
MCH: 29.7 pg (ref 26.0–34.0)
MCHC: 32.3 g/dL (ref 30.0–36.0)
MCV: 91.8 fL (ref 80.0–100.0)
Monocytes Absolute: 0.5 10*3/uL (ref 0.1–1.0)
Monocytes Relative: 10 %
Neutro Abs: 3.2 10*3/uL (ref 1.7–7.7)
Neutrophils Relative %: 60 %
Platelets: 509 10*3/uL — ABNORMAL HIGH (ref 150–400)
RBC: 4.04 MIL/uL (ref 3.87–5.11)
RDW: 15 % (ref 11.5–15.5)
Smear Review: NORMAL
WBC: 5.3 10*3/uL (ref 4.0–10.5)
nRBC: 0 % (ref 0.0–0.2)

## 2020-09-12 LAB — MAGNESIUM: Magnesium: 2 mg/dL (ref 1.7–2.4)

## 2020-09-12 IMAGING — CR DG CHEST 2V
1 series · 2 of 2 positions shown · non-contrast
Comparison: Chest radiograph [DATE] and chest CT [DATE]

CLINICAL DATA: Malignant neoplasm of upper-outer quadrant of right
breast in female, estrogen receptor positive. Evaluate pleural
fluid.

EXAM:
CHEST - 2 VIEW

[Series 1: dg chest 2 view · 0.14mm/px · 2 of 2 slices shown]
[im 1/2]
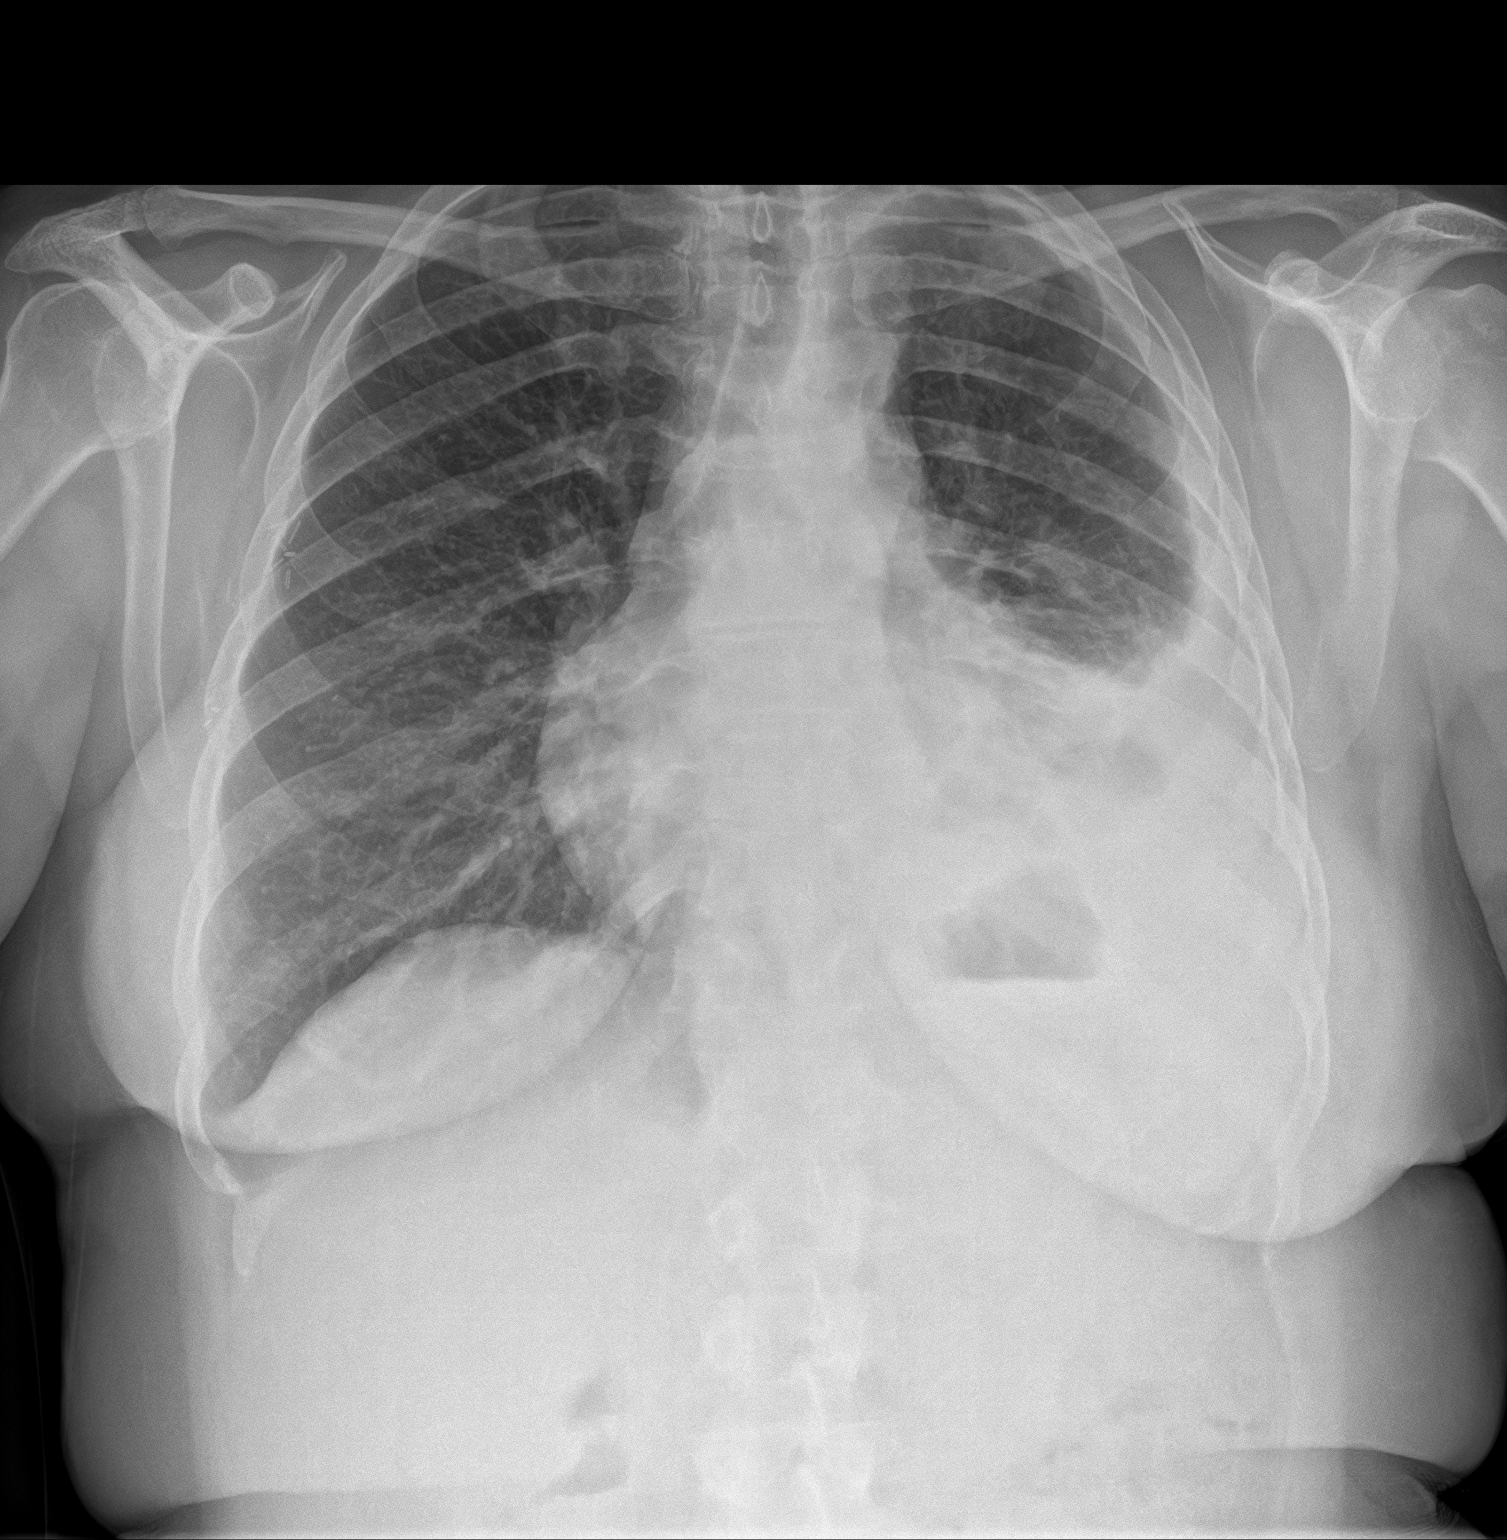
[im 2/2]
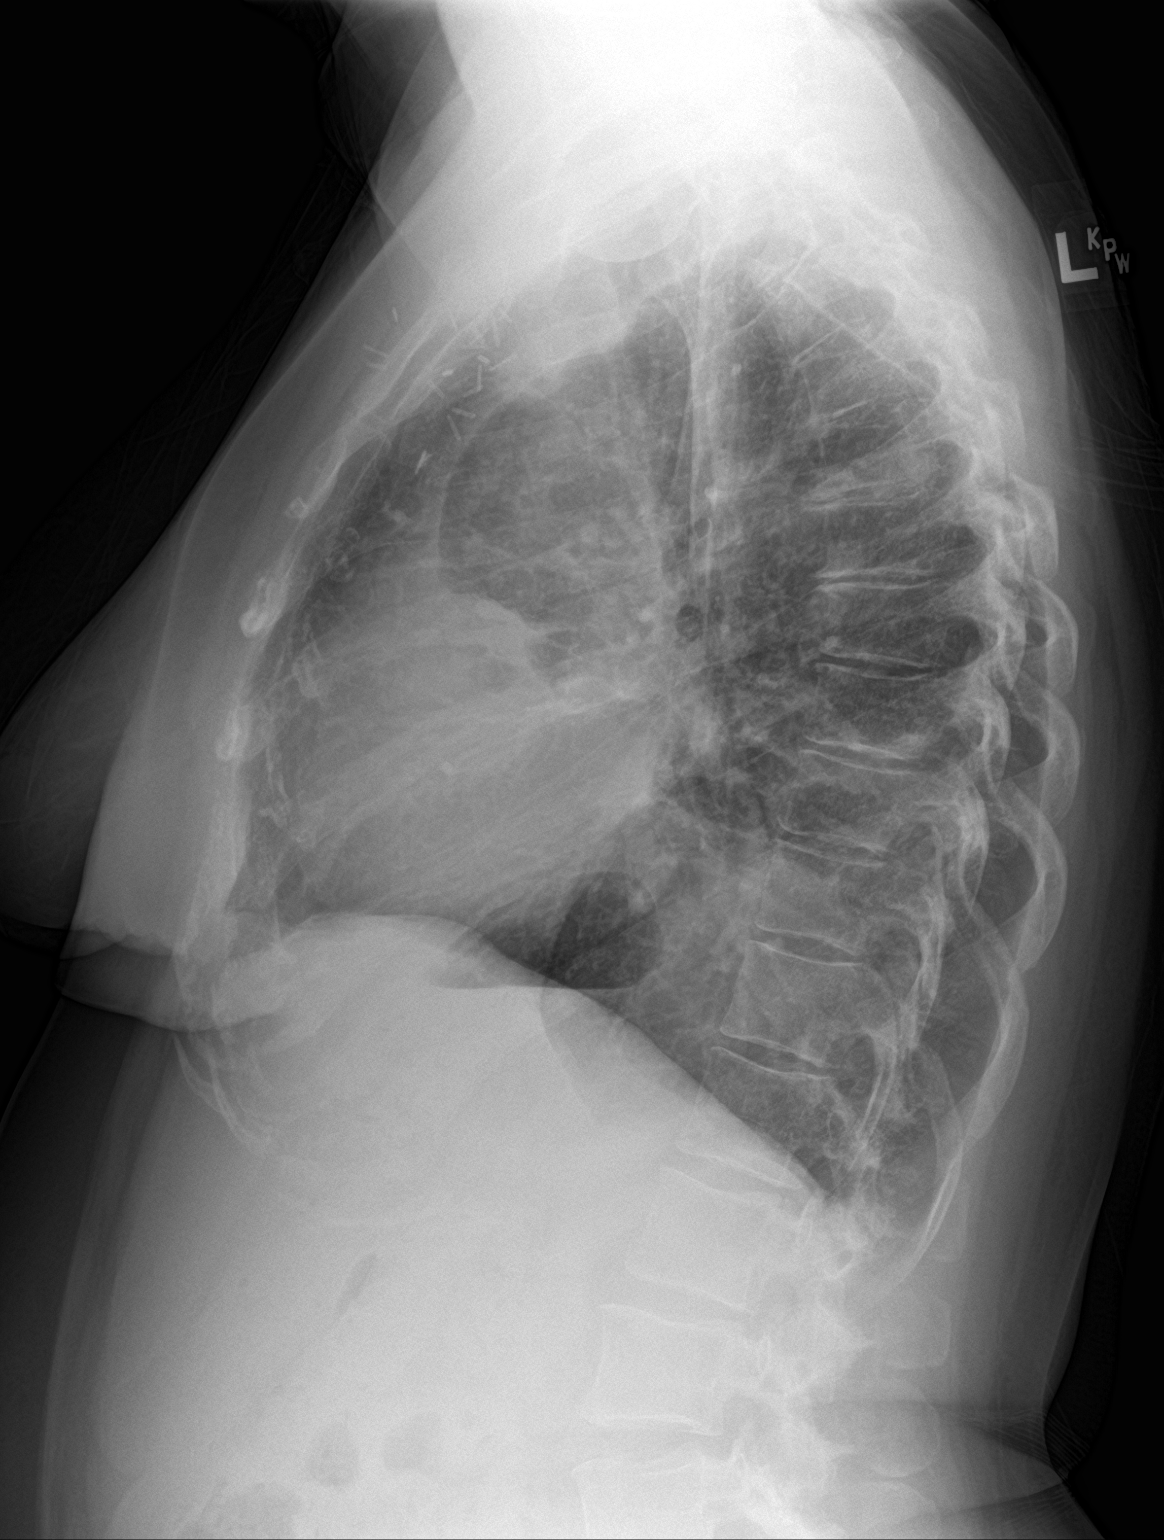

[2 of 2 positions shown; findings below may reference images not displayed]

FINDINGS: Persistent pleural and parenchymal densities in the left chest. Left
chest findings are not significantly changed. Left pleural densities
are compatible with loculated left pleural fluid. Surgical clips in
the right axilla. No significant airspace disease in the right lung.
Heart and mediastinum are stable. No acute bony abnormality.
IMPRESSION: Stable appearance of the pleural-parenchymal densities in the left
chest.

## 2020-09-12 MED ORDER — LEUPROLIDE ACETATE 3.75 MG IM KIT
3.7500 mg | PACK | Freq: Once | INTRAMUSCULAR | Status: AC
  Administered 2020-09-12: 3.75 mg via INTRAMUSCULAR
  Filled 2020-09-12: qty 3.75

## 2020-09-12 MED ORDER — HYDROXYZINE PAMOATE 25 MG PO CAPS: 25.0000 mg | ORAL_CAPSULE | Freq: Every day | ORAL | 0 refills | Status: DC

## 2020-09-12 MED ORDER — VENLAFAXINE HCL ER 150 MG PO CP24: 150.0000 mg | ORAL_CAPSULE | Freq: Every day | ORAL | 1 refills | Status: DC

## 2020-09-12 NOTE — Progress Notes (Signed)
[facility]  [phone] [phone]  Patient Care Team: Dion Body, MD as PCP - General (Family Medicine) Gery Pray, MD as Consulting Physician (Radiation Oncology) Jovita Kussmaul, MD as Consulting Physician (General Surgery) Dillingham, Loel Lofty, DO as Attending Physician (Plastic Surgery) Lloyd Huger, MD as Consulting Physician (Oncology)   Name of the patient: Kaylee Romero  [id number]  1980/12/07   Date of visit: 09/12/20  HPI: Patient is a 40 y.o. female with recurrent metastatic breast cancer. Currently treated with Ibrance (palbociclib), letrozole, and leuprolide (ovarian suppression).   Reason for Consult: Oral chemotherapy follow-up for palbociclib therapy.   PAST MEDICAL HISTORY: Past Medical History:  Diagnosis Date   Anxiety    Family history of breast cancer    HSV (herpes simplex virus) anogenital infection    positive titer only    HEMATOLOGY/ONCOLOGY HISTORY:  Oncology History  Malignant neoplasm of upper-outer quadrant of right breast in female, estrogen receptor positive ([place])  09/29/2018 Initial Diagnosis   Malignant neoplasm of upper-outer quadrant of right breast in female, estrogen receptor positive ([place])   09/2018 -  Anti-estrogen oral therapy   Tamoxifen; held from 12/05/2018-09/06/2019 for surgery   10/05/2018 Cancer Staging   Staging form: Breast, AJCC 8th Edition - Clinical stage from 10/05/2018: Stage IB (cT2, cN0, cM0, G2, ER+, PR+, HER2-) - Signed by Chauncey Cruel, MD on 04/02/2020 Stage prefix: Initial diagnosis Histologic grading system: 3 grade system   10/13/2018 Genetic Testing   Negative genetic testing. No pathogenic variants identified on the [facility] Breast Cancer STAT Panel + Common Hereditary Cancers Panel. The Common Hereditary Cancers Panel offered by [facility] includes sequencing and/or deletion duplication testing of the following 47 genes:  APC, ATM, AXIN2, BARD1, BMPR1A, BRCA1, BRCA2, BRIP1, CDH1, CDKN2A (p14ARF), CDKN2A (p16INK4a), CKD4, CHEK2, CTNNA1, DICER1, EPCAM (Deletion/duplication testing only), GREM1 (promoter region deletion/duplication testing only), KIT, MEN1, MLH1, MSH2, MSH3, MSH6, MUTYH, NBN, NF1, NHTL1, PALB2, PDGFRA, PMS2, POLD1, POLE, PTEN, RAD50, RAD51C, RAD51D, SDHB, SDHC, SDHD, SMAD4, SMARCA4. STK11, TP53, TSC1, TSC2, and VHL.  The following genes were evaluated for sequence changes only: SDHA and HOXB13 c.251G>A variant only. The report date is 10/13/2018.    11/21/2018 Surgery   Right lumpectomy [name]) [phone]): IDC, grade 2, 2.6 cm, with DCIS. Negative margins. 1/4 lymph nodes positive for macrometastasis. ER/PR positive, HER-2 negative.   12/26/2018 - 06/01/2019 Chemotherapy   dexamethasone (DECADRON) 4 MG tablet, 1 of 1 cycle, Start date: 12/05/2018, End date: 03/02/2019  DOXOrubicin (ADRIAMYCIN) chemo injection 114 mg, 60 mg/m2 = 114 mg, Intravenous,  Once, 4 of 4 cycles. Administration: 114 mg (01/05/2019), 114 mg (01/19/2019), 114 mg (02/02/2019), 114 mg (02/15/2019)  palonosetron (ALOXI) injection 0.25 mg, 0.25 mg, Intravenous,  Once, 1 of 1 cycle. Administration: 0.25 mg (01/05/2019)  pegfilgrastim (NEULASTA ONPRO KIT) injection 6 mg, 6 mg, Subcutaneous, Once, 2 of 2 cycles  pegfilgrastim-jmdb (FULPHILA) injection 6 mg, 6 mg, Subcutaneous,  Once, 4 of 4 cycles. Administration: 6 mg (01/07/2019), 6 mg (01/21/2019), 6 mg (02/06/2019), 6 mg (02/17/2019)  cyclophosphamide (CYTOXAN) 1,140 mg in sodium chloride 0.9 % 250 mL chemo infusion, 600 mg/m2 = 1,140 mg, Intravenous,  Once, 4 of 4 cycles. Administration: 1,140 mg (01/05/2019), 1,140 mg (01/19/2019), 1,140 mg (02/02/2019), 1,140 mg (02/15/2019).  PACLitaxel (TAXOL) 150 mg in sodium chloride 0.9 % 250 mL chemo infusion (</= 31m/m2), 80 mg/m2 = 150 mg, Intravenous,  Once, 12 of 12 cycles. Administration: 150 mg (03/02/2019), 150 mg (03/09/2019),  150 mg (03/23/2019),  150 mg (03/30/2019), 150 mg (04/06/2019), 150 mg (04/20/2019), 150 mg (04/27/2019), 150 mg (05/03/2019), 150 mg (05/11/2019), 150 mg (05/18/2019), 150 mg (05/25/2019), 150 mg (06/01/2019)  fosaprepitant (EMEND) 150 mg  dexamethasone (DECADRON) 12 mg in sodium chloride 0.9 % 145 mL IVPB, , Intravenous,  Once, 4 of 4 cycles. Administration:  (01/05/2019),  (01/19/2019),  (02/02/2019),  (02/15/2019).   06/21/2019 - 08/11/2019 Radiation Therapy   The patient initially received a dose of 50.4 Gy in 28 fractions to the breast using whole-breast tangent fields. This was delivered using a 3-D conformal technique. The pt received a boost delivering an additional 16 Gy in 8 fractions using a electron boost with 71mV electrons. The total dose was 66.4 Gy.    11/12/2019 Cancer Staging   Staging form: Breast, AJCC 8th Edition - Pathologic: Stage IB (pT2, pN1a, cM0, G2, ER+, PR+, HER2-)     Oncotype testing   MammaPrint high risk     ALLERGIES:  is allergic to betadine [povidone-iodine].  MEDICATIONS:  Current Outpatient Medications  Medication Sig Dispense Refill   acetaminophen (TYLENOL) 325 MG tablet Take 2 tablets (650 mg total) by mouth every 6 (six) hours as needed for mild pain (or Fever >/= 101).     albuterol (VENTOLIN HFA) 108 (90 Base) MCG/ACT inhaler Inhale 2 puffs into the lungs every 6 (six) hours as needed for wheezing or shortness of breath. 8 g 2   amphetamine-dextroamphetamine (ADDERALL XR) 30 MG 24 hr capsule Take 30 mg by mouth daily.     amphetamine-dextroamphetamine (ADDERALL) 10 MG tablet Take 10 mg by mouth daily in the afternoon.     Ascorbic Acid (VITAMIN C) 500 MG CAPS Take 500 mg by mouth daily.     Calcipotriene-Betameth Diprop 0.005-0.064 % CREA Apply topically.     HYDROcodone-acetaminophen (NORCO) 10-325 MG tablet Take 1-2 tablets by mouth every 4 (four) hours as needed for moderate pain. 60 tablet 0   hydrocortisone cream 1 % Apply 1 application topically 2 (two) times daily. 30 g 0    hydrOXYzine (ATARAX/VISTARIL) 10 MG tablet Take 10 mg by mouth at bedtime as needed for anxiety or itching (Sleep).     ibuprofen (ADVIL) 200 MG tablet Take 400 mg by mouth every 6 (six) hours as needed for mild pain.     letrozole (FEMARA) 2.5 MG tablet Take 1 tablet (2.5 mg total) by mouth daily. 90 tablet 3   Multiple Vitamin (MULTIVITAMIN WITH MINERALS) TABS tablet Take 1 tablet by mouth daily.     palbociclib (IBRANCE) 125 MG tablet Take 1 tablet (125 mg total) by mouth daily. Take for 21 days on, 7 days off, repeat every 28 days. 21 tablet 0   polyethylene glycol (MIRALAX / GLYCOLAX) 17 g packet Take 17 g by mouth daily. 14 each 0   Tetrahydrozoline HCl (VISINE OP) Place 1 drop into both eyes daily as needed (redness).     venlafaxine XR (EFFEXOR-XR) 75 MG 24 hr capsule Take 1 capsule (75 mg total) by mouth at bedtime.     vitamin B-12 (CYANOCOBALAMIN) 1000 MCG tablet Take 1,000 mcg by mouth daily.     No current facility-administered medications for this visit.    VITAL SIGNS: There were no vitals taken for this visit. There were no vitals filed for this visit.  Estimated body mass index is 28.73 kg/m as calculated from the following:   Height as of 09/10/20: _0  (1.626 m).   Weight as of an  earlier encounter on 09/12/20: 75.9 kg (167 lb 6.4 oz).  LABS: CBC:    Component Value Date/Time   WBC 5.3 09/12/2020 1333   HGB 12.0 09/12/2020 1333   HGB 13.1 06/01/2019 1340   HCT 37.1 09/12/2020 1333   PLT 509 (H) 09/12/2020 1333   PLT 322 06/01/2019 1340   MCV 91.8 09/12/2020 1333   NEUTROABS 3.2 09/12/2020 1333   LYMPHSABS 1.4 09/12/2020 1333   MONOABS 0.5 09/12/2020 1333   EOSABS 0.0 09/12/2020 1333   BASOSABS 0.1 09/12/2020 1333   Comprehensive Metabolic Panel:    Component Value Date/Time   NA 136 09/12/2020 1333   K 3.9 09/12/2020 1333   CL 98 09/12/2020 1333   CO2 28 09/12/2020 1333   BUN 10 09/12/2020 1333   CREATININE 0.57 09/12/2020 1333   CREATININE 0.59  06/01/2019 1340   GLUCOSE 169 (H) 09/12/2020 1333   CALCIUM 10.2 09/12/2020 1333   AST 41 09/12/2020 1333   AST 21 06/01/2019 1340   ALT 23 09/12/2020 1333   ALT 26 06/01/2019 1340   ALKPHOS 76 09/12/2020 1333   BILITOT 0.3 09/12/2020 1333   BILITOT 0.5 06/01/2019 1340   PROT 8.6 (H) 09/12/2020 1333   ALBUMIN 4.0 09/12/2020 1333     Present during today's visit: patient only  Assessment and Plan: CBC and CMP reviewed, continue palbociclib 136m 21 days on/7 days off. Today is C2D1.   Oral Chemotherapy Side Effect/Intolerance:  Fatigue: still present but has improved since hospital discharge.  No reported rash, diarrhea, or mouth sores  Other: Trouble sleeping: Lately. She has had trouble falling a sleep. Increased the dose of her venlafaxine and hydroxyzine  Oral Chemotherapy Adherence: no missed doses reported No patient barriers to medication adherence identified.   New medications: none reported  Medication Access Issues: no issues , [facility] expressed understanding and was in agreement with this plan. She also understands that She can call clinic at any time with any questions, concerns, or complaints.   Follow-up plan: f/u in one month  Thank you for allowing me to participate in the care of this very pleasant patient.   Time Total: 20 mins  Visit consisted of counseling and education on dealing with issues of symptom management in the setting of serious and potentially life-threatening illness.Greater than 50%  of this time was spent counseling and coordinating care related to the above assessment and plan.  Signed by: ADarl Pikes PharmD, BCPS, BSalley Slaughter CPP Hematology/Oncology Clinical Pharmacist Practitioner [phone] 09/12/2020 3:06 PM

## 2020-09-12 NOTE — Progress Notes (Signed)
Patient here for follow up. She would like a few more weeks at home to see how she tolerates the [place].

## 2020-09-13 ENCOUNTER — Encounter: Payer: Self-pay | Admitting: Oncology

## 2020-09-13 DIAGNOSIS — Z170 Estrogen receptor positive status [ER+]: Secondary | ICD-10-CM

## 2020-09-13 DIAGNOSIS — C50411 Malignant neoplasm of upper-outer quadrant of right female breast: Secondary | ICD-10-CM

## 2020-09-14 ENCOUNTER — Encounter: Payer: Self-pay | Admitting: Oncology

## 2020-09-19 ENCOUNTER — Ambulatory Visit: Payer: BC Managed Care – PPO

## 2020-09-20 ENCOUNTER — Other Ambulatory Visit: Payer: Self-pay | Admitting: Oncology

## 2020-09-20 DIAGNOSIS — Z853 Personal history of malignant neoplasm of breast: Secondary | ICD-10-CM

## 2020-10-01 ENCOUNTER — Other Ambulatory Visit (HOSPITAL_COMMUNITY): Payer: Self-pay

## 2020-10-01 ENCOUNTER — Ambulatory Visit
Admission: RE | Admit: 2020-10-01 | Discharge: 2020-10-01 | Disposition: A | Discharge status: Home / Self Care | Payer: BC Managed Care – PPO | Attending: Oncology | Admitting: Oncology

## 2020-10-01 ENCOUNTER — Ambulatory Visit
Admission: RE | Admit: 2020-10-01 | Discharge: 2020-10-01 | Disposition: A | Discharge status: Home / Self Care | Payer: BC Managed Care – PPO | Source: Ambulatory Visit | Attending: Oncology | Admitting: Oncology

## 2020-10-01 DIAGNOSIS — Z170 Estrogen receptor positive status [ER+]: Secondary | ICD-10-CM | POA: Diagnosis present

## 2020-10-01 DIAGNOSIS — C50411 Malignant neoplasm of upper-outer quadrant of right female breast: Secondary | ICD-10-CM | POA: Insufficient documentation

## 2020-10-01 IMAGING — CR DG CHEST 2V
1 series · 2 of 2 positions shown · non-contrast
Comparison: [DATE]

CLINICAL DATA: 39-year-old female with a history of breast cancer

EXAM:
CHEST - 2 VIEW

[Series 4: w chest pa · 0.14mm/px · 2 of 2 slices shown]
[im 1/2]
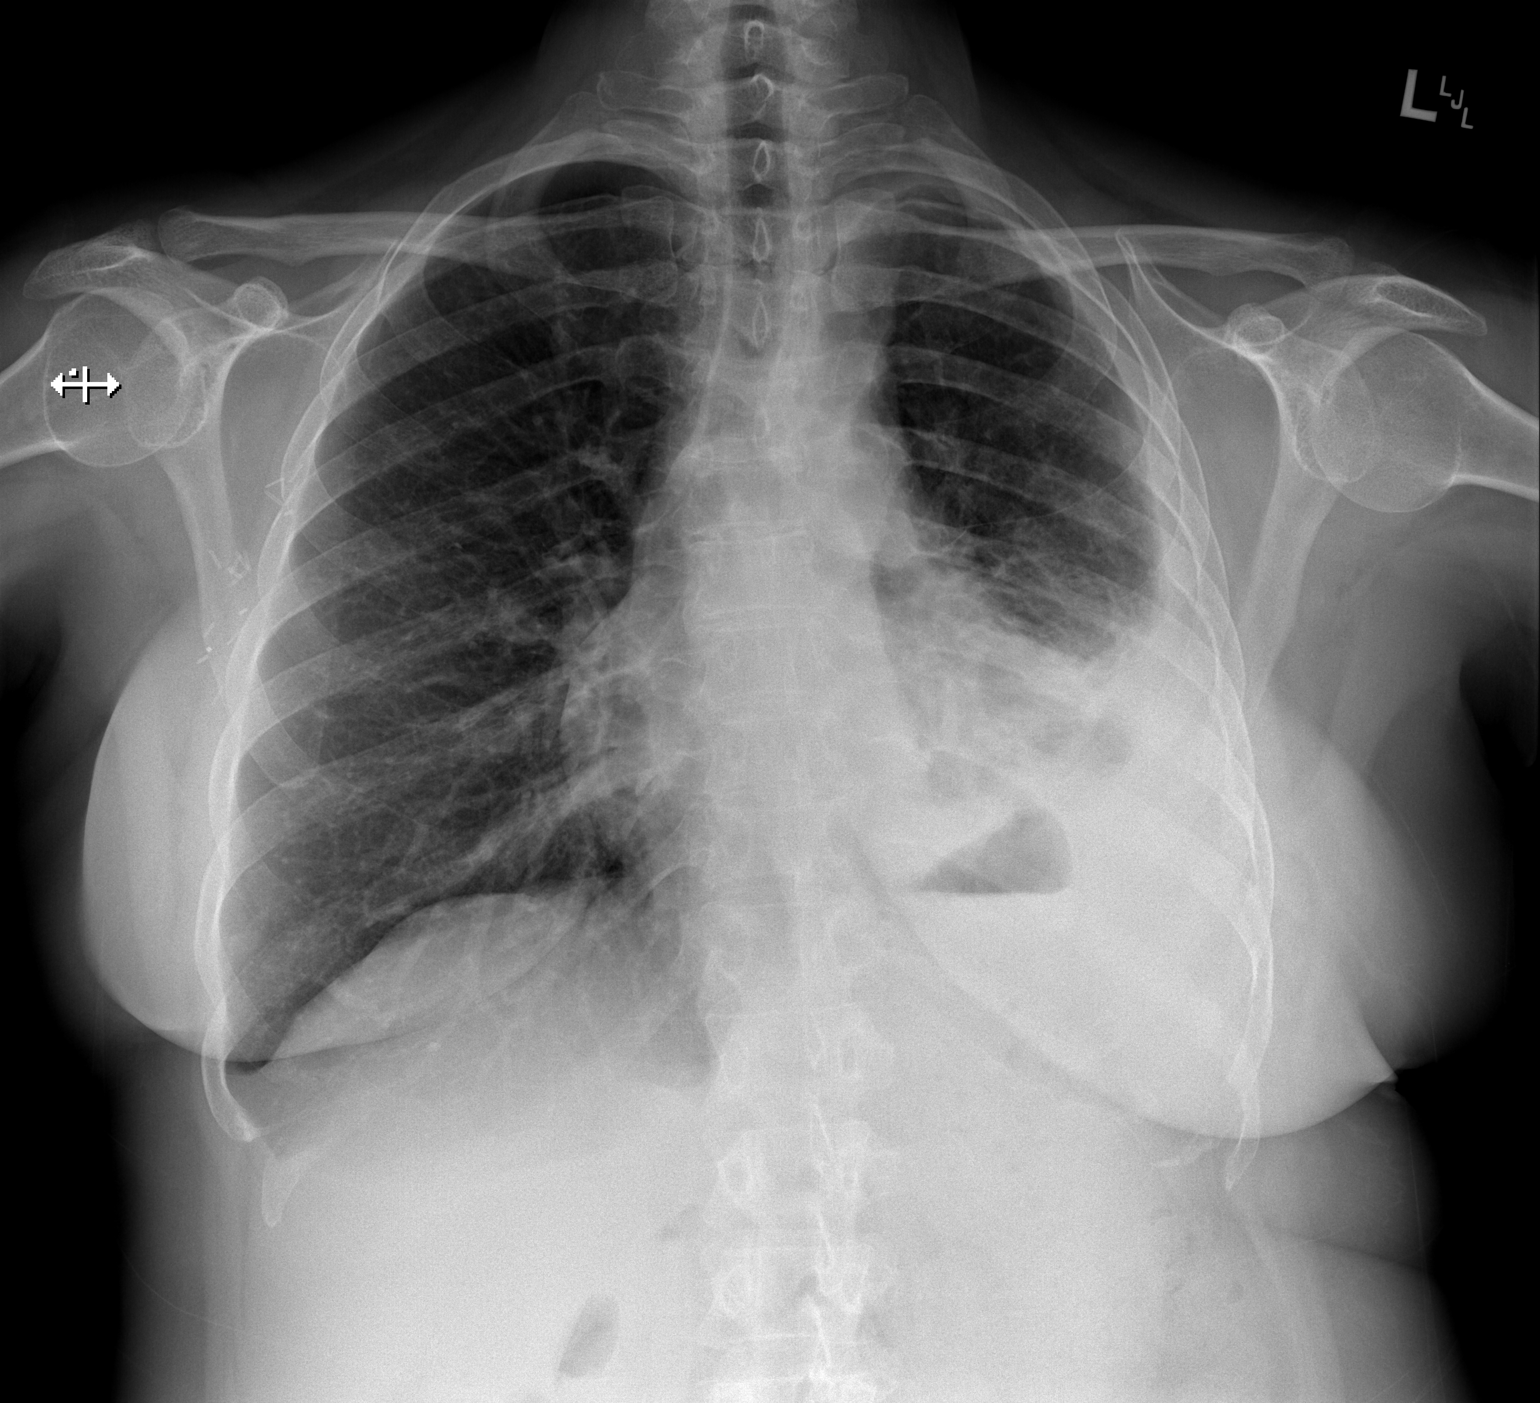
[im 2/2]
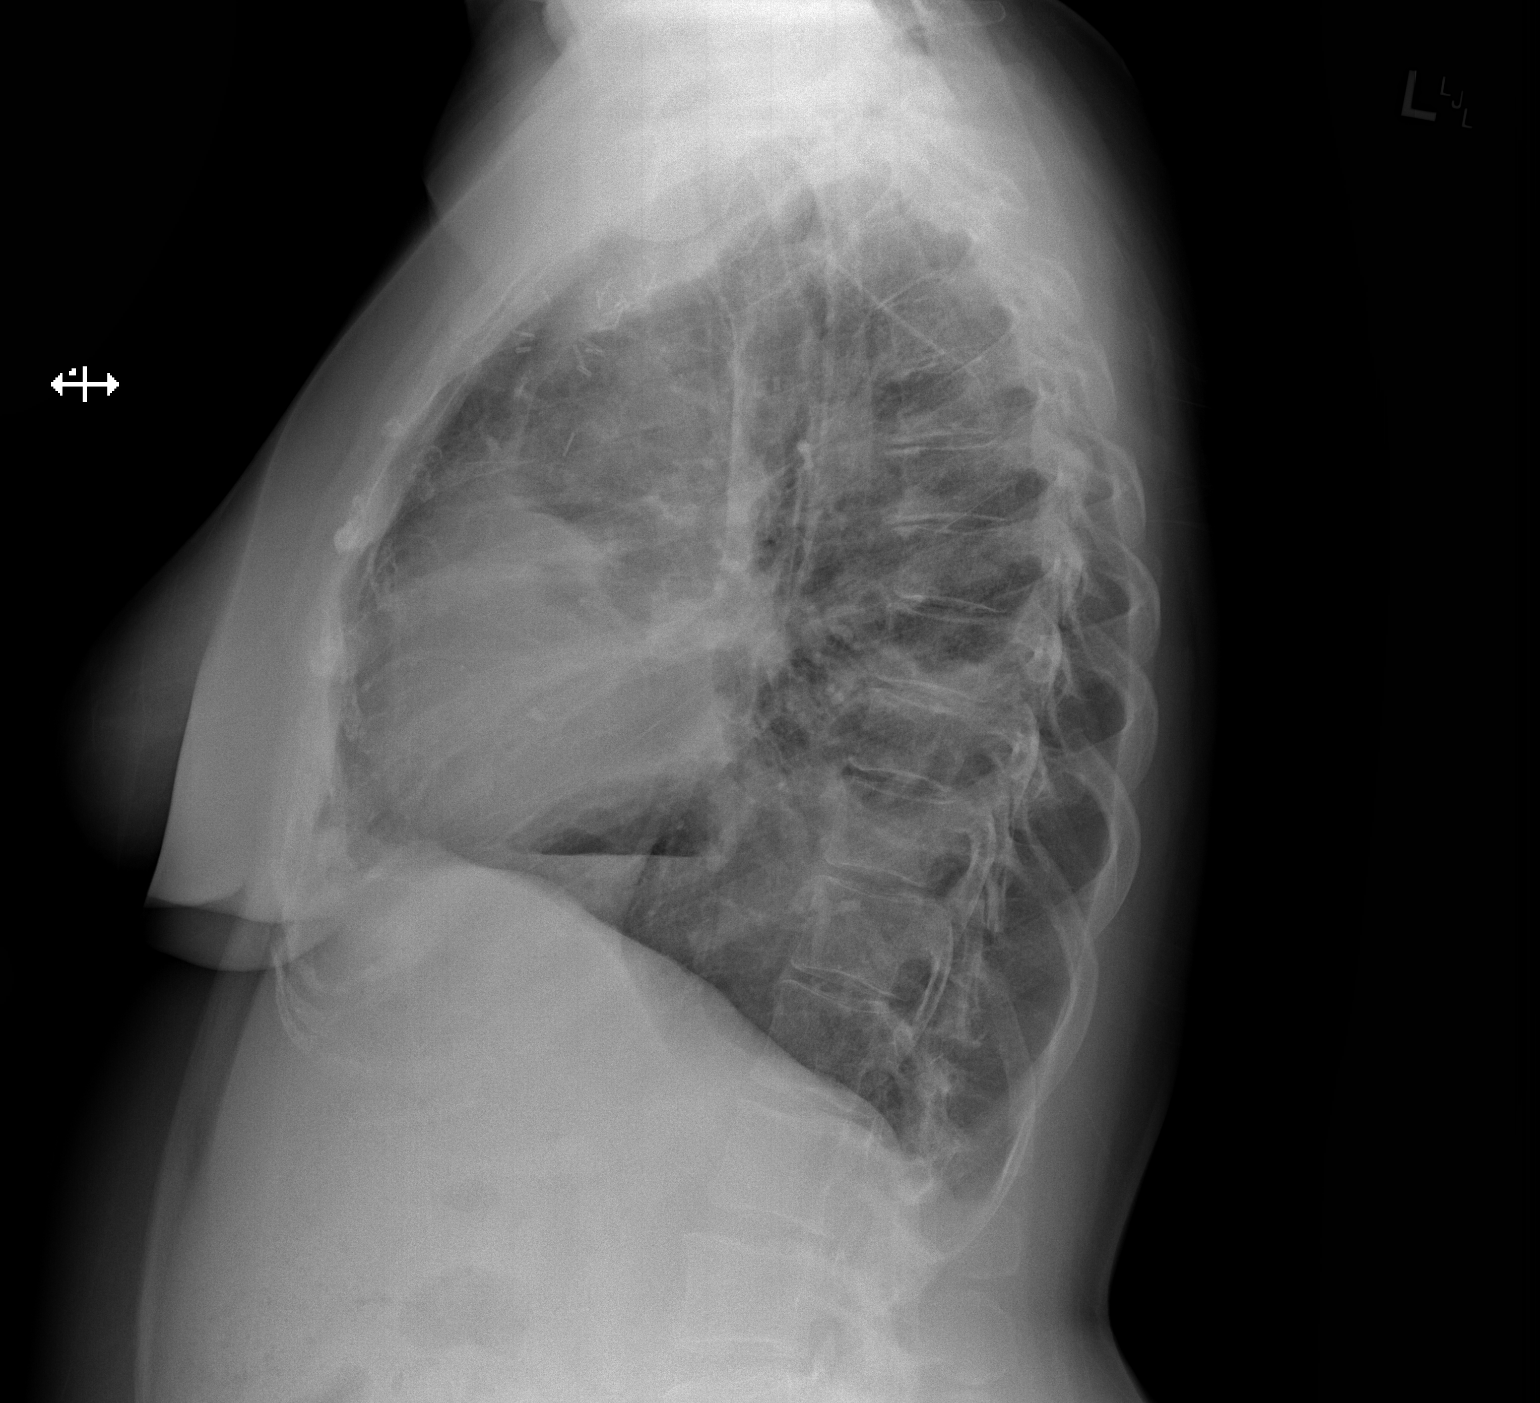

[2 of 2 positions shown; findings below may reference images not displayed]

FINDINGS: Cardiomediastinal silhouette unchanged in size and contour. Left
heart border partially obscured by overlying lung/pleural disease.
No interlobular septal thickening.

Opacity at the left lung base persists with partial obscuration of
the left hemidiaphragm and the left heart border. Pleuroparenchymal
nearly circumferentially the left. Similar appearance to the prior.

Right lung well aerated. No confluent airspace disease. No
pneumothorax.

Surgical changes of the right chest wall

Mild scoliotic curvature of the spine.  No displaced fracture
IMPRESSION: Unchanged appearance of the chest x-ray with left-sided pleural
fluid/thickening and associated parenchymal disease.

## 2020-10-02 ENCOUNTER — Other Ambulatory Visit: Payer: Self-pay | Admitting: Oncology

## 2020-10-02 ENCOUNTER — Other Ambulatory Visit (HOSPITAL_COMMUNITY): Payer: Self-pay

## 2020-10-02 DIAGNOSIS — Z170 Estrogen receptor positive status [ER+]: Secondary | ICD-10-CM

## 2020-10-02 MED ORDER — PALBOCICLIB 125 MG PO TABS
125.0000 mg | ORAL_TABLET | Freq: Every day | ORAL | 0 refills | Status: DC
  Filled 2020-10-02: qty 21, 28d supply, fill #0

## 2020-10-02 NOTE — Telephone Encounter (Signed)
Pt is scheduled for US thoracentesis for 10/03/20 arrive at 2:00 for 2:30 appointment

## 2020-10-03 ENCOUNTER — Other Ambulatory Visit: Payer: Self-pay

## 2020-10-03 ENCOUNTER — Ambulatory Visit
Admission: RE | Admit: 2020-10-03 | Discharge: 2020-10-03 | Disposition: A | Discharge status: Home / Self Care | Payer: BC Managed Care – PPO | Source: Ambulatory Visit | Attending: Oncology | Admitting: Oncology

## 2020-10-03 ENCOUNTER — Other Ambulatory Visit: Payer: Self-pay | Admitting: Oncology

## 2020-10-03 ENCOUNTER — Other Ambulatory Visit (HOSPITAL_COMMUNITY): Payer: Self-pay

## 2020-10-03 DIAGNOSIS — Z170 Estrogen receptor positive status [ER+]: Secondary | ICD-10-CM | POA: Diagnosis present

## 2020-10-03 DIAGNOSIS — C50411 Malignant neoplasm of upper-outer quadrant of right female breast: Secondary | ICD-10-CM | POA: Insufficient documentation

## 2020-10-07 NOTE — Progress Notes (Signed)
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  INTERVAL HISTORY: Patient returns to clinic today for repeat laboratory work and continuation of treatment.  She continues to have cough and shortness of breath, but this is chronic and unchanged.  She has insomnia and increased anxiety.  She has no neurologic complaints.  She has a good appetite.  She denies any chest pain or hemoptysis, but continues to have intermittent left flank pain.  She denies any nausea, vomiting, constipation, or diarrhea.  She has no urinary complaints.  Patient offers no further specific complaints today.  REVIEW OF SYSTEMS:   Review of Systems  Constitutional:  Positive for malaise/fatigue. Negative for fever and weight loss.  Respiratory:  Positive for cough and shortness of breath. Negative for hemoptysis.   Cardiovascular: Negative.  Negative for chest pain and leg swelling.  Gastrointestinal: Negative.  Negative for abdominal pain.  Genitourinary:  Positive for flank pain. Negative for dysuria.  Musculoskeletal:  Negative for back pain.  Skin: Negative.  Negative for rash.  Neurological: Negative.  Negative for dizziness, focal weakness, weakness and headaches.  Psychiatric/Behavioral:  The patient is nervous/anxious and has insomnia.    As per HPI. Otherwise, a complete review of systems is negative.  PAST MEDICAL HISTORY: Past Medical History:   Diagnosis Date   Anxiety    Family history of breast cancer    HSV (herpes simplex virus) anogenital infection    positive titer only    PAST SURGICAL HISTORY: Past Surgical History:  Procedure Laterality Date   BREAST LUMPECTOMY WITH AXILLARY LYMPH NODE BIOPSY Right 11/21/2018   Procedure: RIGHT BREAST LUMPECTOMY WITH SENTINEL LYMPH NODE BIOPSY;  Surgeon: Jovita Kussmaul, MD;  Location: [facility];  Service: General;  Laterality: Right;   BREAST REDUCTION SURGERY Bilateral 11/21/2018   Procedure: BILATERAL MAMMARY REDUCTION  (BREAST);  Surgeon: Wallace Going, DO;  Location: [facility];  Service: Plastics;  Laterality: Bilateral;   CHEST TUBE INSERTION Left 07/29/2020   Procedure: INSERTION PLEURAL DRAINAGE CATHETER;  Surgeon: Lajuana Matte, MD;  Location: [facility];  Service: Thoracic;  Laterality: Left;   IRRIGATION AND DEBRIDEMENT STERNOCLAVICULAR JOINT-STERNUM AND RIBS N/A 08/13/2020   Procedure: IRRIGATION AND DEBRIDEMENT CHEST WALL ABSCESS;  Surgeon: Lajuana Matte, MD;  Location: [facility];  Service: Cardiothoracic;  Laterality: N/A;   PLEURAL BIOPSY Left 07/29/2020   Procedure: PLEURAL BIOPSY;  Surgeon: Lajuana Matte, MD;  Location: [facility];  Service: Thoracic;  Laterality: Left;   PLEURAL EFFUSION DRAINAGE Left 07/29/2020   Procedure: DRAINAGE OF PLEURAL EFFUSION;  Surgeon: Lajuana Matte, MD;  Location: [facility];  Service: Thoracic;  Laterality: Left;   PORT-A-CATH REMOVAL N/A 07/06/2019   Procedure: REMOVAL PORT-A-CATH;  Surgeon: Jovita Kussmaul, MD;  Location: [facility];  Service: General;  Laterality: N/A;   PORTACATH PLACEMENT N/A 11/21/2018   Procedure: INSERTION LEFT PORT-A-CATH WITH ULTRASOUND GUIDANCE;  Surgeon: Jovita Kussmaul, MD;  Location: [facility];  Service: General;  Laterality: N/A;   TONSILLECTOMY  1987   TONSILLECTOMY     VIDEO ASSISTED THORACOSCOPY Left 07/29/2020   Procedure: VIDEO ASSISTED THORACOSCOPY;  Surgeon: Lajuana Matte, MD;   Location[facility];  Service: Thoracic;  Laterality: Left;   WISDOM TOOTH EXTRACTION      FAMILY HISTORY: Family History  Problem Relation Age of Onset   Hypertension Father    Alcohol abuse Paternal Grandfather    Heart disease Paternal Grandfather    Alcohol abuse Maternal Grandfather    Heart disease Maternal Grandmother    Breast cancer Other     ADVANCED DIRECTIVES (Y/N):  N  HEALTH MAINTENANCE: Social History   Tobacco Use   Smoking status: Former    Packs/day: 0.50    Years: 20.00    Pack years: 10.00    Types: Cigarettes    Start date: 01/13/2002    Quit date: 10/17/2016    Years since quitting: 3.9   Smokeless tobacco: Never  Vaping Use   Vaping Use: Never used  Substance Use Topics   Alcohol use: Yes    Comment: Socially   Drug use: No     Colonoscopy:  PAP:  Bone density:  Lipid panel:  Allergies  Allergen Reactions   Betadine [Povidone-Iodine]     Current Outpatient Medications  Medication Sig Dispense Refill   acetaminophen (TYLENOL) 325 MG tablet Take 2 tablets (650 mg total) by mouth every 6 (six) hours as needed for mild pain (or Fever >/= 101).     albuterol (VENTOLIN HFA) 108 (90 Base) MCG/ACT inhaler Inhale 2 puffs into the lungs every 6 (six) hours as needed for wheezing or shortness of breath. 8 g 2   amphetamine-dextroamphetamine (ADDERALL XR) 30 MG 24 hr capsule Take 30 mg by mouth daily.     amphetamine-dextroamphetamine (ADDERALL) 10 MG tablet Take 10 mg by mouth daily in the afternoon.     Ascorbic Acid (VITAMIN C) 500 MG CAPS Take 500 mg by mouth daily.     hydrOXYzine (VISTARIL) 25 MG capsule Take 1 capsule (25 mg total) by mouth at bedtime. Take as needed for anxiety or sleep. 30 capsule 0   ibuprofen (ADVIL) 200 MG tablet Take 400 mg by mouth every 6 (six) hours as needed for mild pain.     letrozole (FEMARA) 2.5 MG tablet Take 1 tablet (2.5 mg total) by mouth daily. 90 tablet 3   Multiple Vitamin (MULTIVITAMIN WITH MINERALS) TABS  tablet Take 1 tablet by mouth daily.     palbociclib (IBRANCE) 125 MG tablet Take 1 tablet (125 mg total) by mouth daily. Take for 21 days on, 7 days off, repeat every 28 days. 21 tablet 0   polyethylene glycol (MIRALAX / GLYCOLAX) 17 g packet Take 17 g by mouth daily. 14 each 0   Tetrahydrozoline HCl (VISINE OP) Place 1 drop into both eyes daily as needed (redness).     venlafaxine XR (EFFEXOR-XR) 150 MG 24 hr capsule TAKE 1 CAPSULE(150 MG) BY MOUTH DAILY WITH BREAKFAST 30 capsule 1   vitamin B-12 (CYANOCOBALAMIN) 1000 MCG tablet Take 1,000 mcg by mouth daily.     Calcipotriene-Betameth Diprop 0.005-0.064 % CREA Apply topically. (Patient not taking: Reported on 10/10/2020)     HYDROcodone-acetaminophen (NORCO) 10-325 MG tablet Take 1-2 tablets by mouth every 4 (four) hours as needed for moderate pain. (Patient not taking: Reported on 10/10/2020) 60 tablet 0   hydrocortisone cream 1 % Apply 1 application topically 2 (two) times daily. (Patient not taking: Reported on 10/10/2020) 30 g  0   No current facility-administered medications for this visit.    OBJECTIVE: Vitals:   10/10/20 1359  BP: 125/85  Pulse: 92  Resp: 16  Temp: (!) 97.2 F (36.2 C)  SpO2: 99%      Body mass index is 28.85 kg/m.    ECOG FS:0 - Asymptomatic  General: Well-developed, well-nourished, no acute distress. Eyes: Pink conjunctiva, anicteric sclera. HEENT: Normocephalic, moist mucous membranes. Lungs: No audible wheezing or coughing. Heart: Regular rate and rhythm. Abdomen: Soft, nontender, no obvious distention. Musculoskeletal: No edema, cyanosis, or clubbing. Neuro: Alert, answering all questions appropriately. Cranial nerves grossly intact. Skin: No rashes or petechiae noted. Psych: Normal affect.  LAB RESULTS:  Lab Results  Component Value Date   NA 136 10/10/2020   K 3.5 10/10/2020   CL 97 (L) 10/10/2020   CO2 30 10/10/2020   GLUCOSE 137 (H) 10/10/2020   BUN 13 10/10/2020   CREATININE 0.67  10/10/2020   CALCIUM 11.1 (H) 10/10/2020   PROT 8.4 (H) 10/10/2020   ALBUMIN 4.2 10/10/2020   AST 55 (H) 10/10/2020   ALT 66 (H) 10/10/2020   ALKPHOS 96 10/10/2020   BILITOT <0.1 (L) 10/10/2020   GFRNONAA >60 10/10/2020   GFRAA >60 08/07/2019    Lab Results  Component Value Date   WBC 5.5 10/10/2020   NEUTROABS 3.2 10/10/2020   HGB 12.3 10/10/2020   HCT 37.1 10/10/2020   MCV 91.4 10/10/2020   PLT 308 10/10/2020     STUDIES: DG Chest 2 View  Result Date: 10/02/2020 CLINICAL DATA:  40 year old female with a history of breast cancer EXAM: CHEST - 2 VIEW COMPARISON:  09/12/2020 FINDINGS: Cardiomediastinal silhouette unchanged in size and contour. Left heart border partially obscured by overlying lung/pleural disease. No interlobular septal thickening. Opacity at the left lung base persists with partial obscuration of the left hemidiaphragm and the left heart border. Pleuroparenchymal nearly circumferentially the left. Similar appearance to the prior. Right lung well aerated. No confluent airspace disease. No pneumothorax. Surgical changes of the right chest wall Mild scoliotic curvature of the spine.  No displaced fracture IMPRESSION: Unchanged appearance of the chest x-ray with left-sided pleural fluid/thickening and associated parenchymal disease. Electronically Signed   By: Corrie Mckusick D.O.   On: 10/02/2020 08:22   DG Chest 2 View  Result Date: 09/13/2020 CLINICAL DATA:  Malignant neoplasm of upper-outer quadrant of right breast in female, estrogen receptor positive. Evaluate pleural fluid. EXAM: CHEST - 2 VIEW COMPARISON:  Chest radiograph 08/22/2020 and chest CT 08/11/2020 FINDINGS: Persistent pleural and parenchymal densities in the left chest. Left chest findings are not significantly changed. Left pleural densities are compatible with loculated left pleural fluid. Surgical clips in the right axilla. No significant airspace disease in the right lung. Heart and mediastinum are  stable. No acute bony abnormality. IMPRESSION: Stable appearance of the pleural-parenchymal densities in the left chest. Electronically Signed   By: Markus Daft M.D.   On: 09/13/2020 13:28   [place] CHEST (PLEURAL EFFUSION)  Result Date: 10/05/2020 CLINICAL DATA:  Assessment for thoracentesis. EXAM: CHEST ULTRASOUND COMPARISON:  None. FINDINGS: Examination demonstrates a small amount of left pleural fluid determined not sufficient enough to warrant thoracentesis. IMPRESSION: Small left pleural effusion. Electronically Signed   By: Marin Olp M.D.   On: 10/05/2020 12:26    ONCOLOGY HISTORY: Patient initially self palpated a lump in the 12 o'clock position of her right breast and subsequently underwent biopsy on September 26, 2018 confirming malignancy.  Initial MammaPrint  was reported high risk.  She underwent right lumpectomy on November 21, 2018 which revealed a T2, N1, M0 stage IIa malignancy.  1 of 4 lymph nodes were positive for disease.  She subsequently underwent adjuvant chemotherapy with AC/Taxol completing treatment on Jun 01, 2019.  She then completed adjuvant XRT on August 11, 2019 and was started on tamoxifen.  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  She has noted to have retrocrural lymph node with definite involvement and suspicious retroperitoneal and supraclavicular nodes.  She had equivocal hypermetabolism in her liver.  MRI of the brain on August 18, 2020 was negative for metastatic disease.  Patient also underwent second opinion at [facility] in [place].  Agree with recommendation to proceed with Lupron 3.75 mg every 28 days for up to  24 months for ovarian suppression along with an aromatase inhibitor and Ibrance 125 mg for 21 days with 7 days off.  She has been instructed to discontinue tamoxifen.  Can also consider capecitabine or [place] in the future if treatment is not tolerated or patient has progression of disease.  Proceed with cycle 3 of Ibrance and Lupron today.  Continue daily letrozole.  Return to clinic in 4 weeks for further evaluation and continuation of treatment.  Will repeat PET scan prior to next clinic visit. Cough/shortness of breath: Chronic and unchanged.  Continue Tussionex, hydrocodone, and albuterol inhaler.  Recent chest x-ray was unchanged.  Ultrasound did not reveal a significant amount of fluid for thoracentesis.   Anasarca/fluid retention: Resolved.   Pleural abscess: Treated and drained by thoracic surgery in [place].  Resolved.  Chest x-ray and possible thoracentesis as above.   Thrombocytosis: Resolved. Hypercalcemia: Mild, monitor.   Transaminitis: Mild, monitor.  Patient expressed understanding and was in agreement with this plan. She also understands that She can call clinic at any time with any questions, concerns, or complaints.    Cancer Staging Malignant neoplasm of upper-outer quadrant of right breast in female, estrogen receptor positive ([place]) Staging form: Breast, AJCC 8th Edition - Clinical stage from 10/05/2018: Stage IB (cT2, cN0, cM0, G2, ER+, PR+, HER2-) - Signed by Chauncey Cruel, MD on 04/02/2020 Stage prefix: Initial diagnosis Histologic grading system: 3 grade system - Pathologic stage from 08/09/2020: No Stage Recommended (ypT2, pN1a, pM1, G2, ER+, PR+, HER2-) - Signed by Lloyd Huger, MD on 08/09/2020 Stage prefix: Post-therapy Multigene prognostic tests performed: MammaPrint Histologic grading system: 3 grade system   Lloyd Huger, MD   10/10/2020 3:20 PM

## 2020-10-10 ENCOUNTER — Other Ambulatory Visit: Payer: Self-pay | Admitting: Oncology

## 2020-10-10 ENCOUNTER — Inpatient Hospital Stay: Payer: BC Managed Care – PPO | Admitting: Pharmacist

## 2020-10-10 ENCOUNTER — Inpatient Hospital Stay: Payer: BC Managed Care – PPO

## 2020-10-10 ENCOUNTER — Other Ambulatory Visit: Payer: Self-pay

## 2020-10-10 ENCOUNTER — Inpatient Hospital Stay: Payer: BC Managed Care – PPO | Attending: Oncology

## 2020-10-10 ENCOUNTER — Inpatient Hospital Stay (HOSPITAL_BASED_OUTPATIENT_CLINIC_OR_DEPARTMENT_OTHER): Payer: BC Managed Care – PPO | Admitting: Oncology

## 2020-10-10 ENCOUNTER — Other Ambulatory Visit (HOSPITAL_COMMUNITY): Payer: Self-pay

## 2020-10-10 VITALS — BP 125/85 | HR 92 | Temp 97.2°F | Resp 16 | Wt 168.1 lb

## 2020-10-10 DIAGNOSIS — R059 Cough, unspecified: Secondary | ICD-10-CM | POA: Diagnosis not present

## 2020-10-10 DIAGNOSIS — C50411 Malignant neoplasm of upper-outer quadrant of right female breast: Secondary | ICD-10-CM

## 2020-10-10 DIAGNOSIS — R7401 Elevation of levels of liver transaminase levels: Secondary | ICD-10-CM | POA: Diagnosis not present

## 2020-10-10 DIAGNOSIS — Z79899 Other long term (current) drug therapy: Secondary | ICD-10-CM | POA: Diagnosis present

## 2020-10-10 DIAGNOSIS — Z170 Estrogen receptor positive status [ER+]: Secondary | ICD-10-CM | POA: Diagnosis not present

## 2020-10-10 DIAGNOSIS — C782 Secondary malignant neoplasm of pleura: Secondary | ICD-10-CM | POA: Insufficient documentation

## 2020-10-10 DIAGNOSIS — Z79811 Long term (current) use of aromatase inhibitors: Secondary | ICD-10-CM | POA: Insufficient documentation

## 2020-10-10 LAB — CBC WITH DIFFERENTIAL/PLATELET
Abs Immature Granulocytes: 0.02 10*3/uL (ref 0.00–0.07)
Basophils Absolute: 0.1 10*3/uL (ref 0.0–0.1)
Basophils Relative: 1 %
Eosinophils Absolute: 0.1 10*3/uL (ref 0.0–0.5)
Eosinophils Relative: 1 %
HCT: 37.1 % (ref 36.0–46.0)
Hemoglobin: 12.3 g/dL (ref 12.0–15.0)
Immature Granulocytes: 0 %
Lymphocytes Relative: 29 %
Lymphs Abs: 1.6 10*3/uL (ref 0.7–4.0)
MCH: 30.3 pg (ref 26.0–34.0)
MCHC: 33.2 g/dL (ref 30.0–36.0)
MCV: 91.4 fL (ref 80.0–100.0)
Monocytes Absolute: 0.6 10*3/uL (ref 0.1–1.0)
Monocytes Relative: 11 %
Neutro Abs: 3.2 10*3/uL (ref 1.7–7.7)
Neutrophils Relative %: 58 %
Platelets: 308 10*3/uL (ref 150–400)
RBC: 4.06 MIL/uL (ref 3.87–5.11)
RDW: 17 % — ABNORMAL HIGH (ref 11.5–15.5)
WBC: 5.5 10*3/uL (ref 4.0–10.5)
nRBC: 0 % (ref 0.0–0.2)

## 2020-10-10 LAB — COMPREHENSIVE METABOLIC PANEL
ALT: 66 U/L — ABNORMAL HIGH (ref 0–44)
AST: 55 U/L — ABNORMAL HIGH (ref 15–41)
Albumin: 4.2 g/dL (ref 3.5–5.0)
Alkaline Phosphatase: 96 U/L (ref 38–126)
Anion gap: 9 (ref 5–15)
BUN: 13 mg/dL (ref 6–20)
CO2: 30 mmol/L (ref 22–32)
Calcium: 11.1 mg/dL — ABNORMAL HIGH (ref 8.9–10.3)
Chloride: 97 mmol/L — ABNORMAL LOW (ref 98–111)
Creatinine, Ser: 0.67 mg/dL (ref 0.44–1.00)
GFR, Estimated: >60 mL/min (ref >60)
Glucose, Bld: 137 mg/dL — ABNORMAL HIGH (ref 70–99)
Potassium: 3.5 mmol/L (ref 3.5–5.1)
Sodium: 136 mmol/L (ref 135–145)
Total Bilirubin: <0.1 mg/dL — ABNORMAL LOW (ref 0.3–1.2)
Total Protein: 8.4 g/dL — ABNORMAL HIGH (ref 6.5–8.1)

## 2020-10-10 LAB — MAGNESIUM: Magnesium: 1.9 mg/dL (ref 1.7–2.4)

## 2020-10-10 MED ORDER — LEUPROLIDE ACETATE 3.75 MG IM KIT
3.7500 mg | PACK | Freq: Once | INTRAMUSCULAR | Status: AC
  Administered 2020-10-10: 3.75 mg via INTRAMUSCULAR
  Filled 2020-10-10: qty 3.75

## 2020-10-10 MED ORDER — PALBOCICLIB 125 MG PO TABS
125.0000 mg | ORAL_TABLET | Freq: Every day | ORAL | 2 refills | Status: DC
  Filled 2020-10-10 – 2020-10-30 (×2): qty 21, 21d supply, fill #0
  Filled 2020-11-27: qty 21, 28d supply, fill #1
  Filled 2020-12-26: qty 21, 28d supply, fill #2

## 2020-10-10 NOTE — Progress Notes (Signed)
Pt endorses difficulty going to sleep and staying asleep despite vistaril. No other concerns at this time.

## 2020-10-10 NOTE — Progress Notes (Signed)
[facility]  Telephone:([phone] [phone]  Patient Care Team: Dion Body, MD as PCP - General (Family Medicine) Gery Pray, MD as Consulting Physician (Radiation Oncology) Jovita Kussmaul, MD as Consulting Physician (General Surgery) Dillingham, Loel Lofty, DO as Attending Physician (Plastic Surgery) Lloyd Huger, MD as Consulting Physician (Oncology)   Name of the patient: Kaylee Romero  [id number]  Jan 10, 1980   Date of visit: 10/10/20  HPI: Patient is a 40 y.o. female with recurrent metastatic breast cancer. Currently treated with Ibrance (palbociclib), letrozole, and leuprolide (ovarian suppression).   Reason for Consult: Oral chemotherapy follow-up for palbociclib therapy.   PAST MEDICAL HISTORY: Past Medical History:  Diagnosis Date   Anxiety    Family history of breast cancer    HSV (herpes simplex virus) anogenital infection    positive titer only    HEMATOLOGY/ONCOLOGY HISTORY:  Oncology History  Malignant neoplasm of upper-outer quadrant of right breast in female, estrogen receptor positive ([place])  09/29/2018 Initial Diagnosis   Malignant neoplasm of upper-outer quadrant of right breast in female, estrogen receptor positive ([place])   09/2018 -  Anti-estrogen oral therapy   Tamoxifen; held from 12/05/2018-09/06/2019 for surgery   10/05/2018 Cancer Staging   Staging form: Breast, AJCC 8th Edition - Clinical stage from 10/05/2018: Stage IB (cT2, cN0, cM0, G2, ER+, PR+, HER2-) - Signed by Chauncey Cruel, MD on 04/02/2020 Stage prefix: Initial diagnosis Histologic grading system: 3 grade system   10/13/2018 Genetic Testing   Negative genetic testing. No pathogenic variants identified on the [facility] Breast Cancer STAT Panel + Common Hereditary Cancers Panel. The Common Hereditary Cancers Panel offered by [facility] includes sequencing and/or deletion duplication testing of the following 47 genes:  APC, ATM, AXIN2, BARD1, BMPR1A, BRCA1, BRCA2, BRIP1, CDH1, CDKN2A (p14ARF), CDKN2A (p16INK4a), CKD4, CHEK2, CTNNA1, DICER1, EPCAM (Deletion/duplication testing only), GREM1 (promoter region deletion/duplication testing only), KIT, MEN1, MLH1, MSH2, MSH3, MSH6, MUTYH, NBN, NF1, NHTL1, PALB2, PDGFRA, PMS2, POLD1, POLE, PTEN, RAD50, RAD51C, RAD51D, SDHB, SDHC, SDHD, SMAD4, SMARCA4. STK11, TP53, TSC1, TSC2, and VHL.  The following genes were evaluated for sequence changes only: SDHA and HOXB13 c.251G>A variant only. The report date is 10/13/2018.    11/21/2018 Surgery   Right lumpectomy Kaylee Romero) [id number]): IDC, grade 2, 2.6 cm, with DCIS. Negative margins. 1/4 lymph nodes positive for macrometastasis. ER/PR positive, HER-2 negative.   12/26/2018 - 06/01/2019 Chemotherapy   dexamethasone (DECADRON) 4 MG tablet, 1 of 1 cycle, Start date: 12/05/2018, End date: 03/02/2019  DOXOrubicin (ADRIAMYCIN) chemo injection 114 mg, 60 mg/m2 = 114 mg, Intravenous,  Once, 4 of 4 cycles. Administration: 114 mg (01/05/2019), 114 mg (01/19/2019), 114 mg (02/02/2019), 114 mg (02/15/2019)  palonosetron (ALOXI) injection 0.25 mg, 0.25 mg, Intravenous,  Once, 1 of 1 cycle. Administration: 0.25 mg (01/05/2019)  pegfilgrastim (NEULASTA ONPRO KIT) injection 6 mg, 6 mg, Subcutaneous, Once, 2 of 2 cycles  pegfilgrastim-jmdb (FULPHILA) injection 6 mg, 6 mg, Subcutaneous,  Once, 4 of 4 cycles. Administration: 6 mg (01/07/2019), 6 mg (01/21/2019), 6 mg (02/06/2019), 6 mg (02/17/2019)  cyclophosphamide (CYTOXAN) 1,140 mg in sodium chloride 0.9 % 250 mL chemo infusion, 600 mg/m2 = 1,140 mg, Intravenous,  Once, 4 of 4 cycles. Administration: 1,140 mg (01/05/2019), 1,140 mg (01/19/2019), 1,140 mg (02/02/2019), 1,140 mg (02/15/2019).  PACLitaxel (TAXOL) 150 mg in sodium chloride 0.9 % 250 mL chemo infusion (</= 51m/m2), 80 mg/m2 = 150 mg, Intravenous,  Once, 12 of 12 cycles. Administration: 150 mg (03/02/2019), 150 mg (03/09/2019),  150 mg (03/23/2019),  150 mg (03/30/2019), 150 mg (04/06/2019), 150 mg (04/20/2019), 150 mg (04/27/2019), 150 mg (05/03/2019), 150 mg (05/11/2019), 150 mg (05/18/2019), 150 mg (05/25/2019), 150 mg (06/01/2019)  fosaprepitant (EMEND) 150 mg  dexamethasone (DECADRON) 12 mg in sodium chloride 0.9 % 145 mL IVPB, , Intravenous,  Once, 4 of 4 cycles. Administration:  (01/05/2019),  (01/19/2019),  (02/02/2019),  (02/15/2019).   06/21/2019 - 08/11/2019 Radiation Therapy   The patient initially received a dose of 50.4 Gy in 28 fractions to the breast using whole-breast tangent fields. This was delivered using a 3-D conformal technique. The pt received a boost delivering an additional 16 Gy in 8 fractions using a electron boost with 57mV electrons. The total dose was 66.4 Gy.    11/12/2019 Cancer Staging   Staging form: Breast, AJCC 8th Edition - Pathologic: Stage IB (pT2, pN1a, cM0, G2, ER+, PR+, HER2-)     Oncotype testing   MammaPrint high risk     ALLERGIES:  is allergic to betadine [povidone-iodine].  MEDICATIONS:  Current Outpatient Medications  Medication Sig Dispense Refill   acetaminophen (TYLENOL) 325 MG tablet Take 2 tablets (650 mg total) by mouth every 6 (six) hours as needed for mild pain (or Fever >/= 101).     albuterol (VENTOLIN HFA) 108 (90 Base) MCG/ACT inhaler Inhale 2 puffs into the lungs every 6 (six) hours as needed for wheezing or shortness of breath. 8 g 2   amphetamine-dextroamphetamine (ADDERALL XR) 30 MG 24 hr capsule Take 30 mg by mouth daily.     amphetamine-dextroamphetamine (ADDERALL) 10 MG tablet Take 10 mg by mouth daily in the afternoon.     Ascorbic Acid (VITAMIN C) 500 MG CAPS Take 500 mg by mouth daily.     Calcipotriene-Betameth Diprop 0.005-0.064 % CREA Apply topically. (Patient not taking: Reported on 10/10/2020)     HYDROcodone-acetaminophen (NORCO) 10-325 MG tablet Take 1-2 tablets by mouth every 4 (four) hours as needed for moderate pain. (Patient not taking: Reported on 10/10/2020) 60 tablet  0   hydrocortisone cream 1 % Apply 1 application topically 2 (two) times daily. (Patient not taking: Reported on 10/10/2020) 30 g 0   hydrOXYzine (VISTARIL) 25 MG capsule Take 1 capsule (25 mg total) by mouth at bedtime. Take as needed for anxiety or sleep. 30 capsule 0   ibuprofen (ADVIL) 200 MG tablet Take 400 mg by mouth every 6 (six) hours as needed for mild pain.     letrozole (FEMARA) 2.5 MG tablet Take 1 tablet (2.5 mg total) by mouth daily. 90 tablet 3   Multiple Vitamin (MULTIVITAMIN WITH MINERALS) TABS tablet Take 1 tablet by mouth daily.     palbociclib (IBRANCE) 125 MG tablet Take 1 tablet (125 mg total) by mouth daily. Take for 21 days on, 7 days off, repeat every 28 days. 21 tablet 0   polyethylene glycol (MIRALAX / GLYCOLAX) 17 g packet Take 17 g by mouth daily. 14 each 0   Tetrahydrozoline HCl (VISINE OP) Place 1 drop into both eyes daily as needed (redness).     venlafaxine XR (EFFEXOR-XR) 150 MG 24 hr capsule TAKE 1 CAPSULE(150 MG) BY MOUTH DAILY WITH BREAKFAST 30 capsule 1   vitamin B-12 (CYANOCOBALAMIN) 1000 MCG tablet Take 1,000 mcg by mouth daily.     No current facility-administered medications for this visit.    VITAL SIGNS: There were no vitals taken for this visit. There were no vitals filed for this visit.  Estimated body mass index is 28.85  kg/m as calculated from the following:   Height as of 09/10/20: 5' 4" (1.626 m).   Weight as of an earlier encounter on 10/10/20: 76.2 kg (168 lb 1.6 oz).  LABS: CBC:    Component Value Date/Time   WBC 5.5 10/10/2020 1349   HGB 12.3 10/10/2020 1349   HGB 13.1 06/01/2019 1340   HCT 37.1 10/10/2020 1349   PLT 308 10/10/2020 1349   PLT 322 06/01/2019 1340   MCV 91.4 10/10/2020 1349   NEUTROABS 3.2 10/10/2020 1349   LYMPHSABS 1.6 10/10/2020 1349   MONOABS 0.6 10/10/2020 1349   EOSABS 0.1 10/10/2020 1349   BASOSABS 0.1 10/10/2020 1349   Comprehensive Metabolic Panel:    Component Value Date/Time   NA 136 09/12/2020  1333   K 3.9 09/12/2020 1333   CL 98 09/12/2020 1333   CO2 28 09/12/2020 1333   BUN 10 09/12/2020 1333   CREATININE 0.57 09/12/2020 1333   CREATININE 0.59 06/01/2019 1340   GLUCOSE 169 (H) 09/12/2020 1333   CALCIUM 10.2 09/12/2020 1333   AST 41 09/12/2020 1333   AST 21 06/01/2019 1340   ALT 23 09/12/2020 1333   ALT 26 06/01/2019 1340   ALKPHOS 76 09/12/2020 1333   BILITOT 0.3 09/12/2020 1333   BILITOT 0.5 06/01/2019 1340   PROT 8.6 (H) 09/12/2020 1333   ALBUMIN 4.0 09/12/2020 1333     Present during today's visit: patient only  Assessment and Plan: CBC and CMP reviewed, continue palbociclib 142m 21 days on/7 days off. Today is C3D1.   Oral Chemotherapy Side Effect/Intolerance:  Fatigue: still present, but manageable. She still worry about being able to make it through a full day of work No reported diarrhea or nausea  Other: Trouble sleeping: continues to have trouble falling asleep and staying a sleep, mostly related to anxiety about her cancer. The dose of her venlafaxine was increased at her last office visit. MD plans on prescribing alprazolam for Kaylee Romero to use as needed  Oral Chemotherapy Adherence: no missed doses reported No patient barriers to medication adherence identified.   New medications: none reported  Medication Access Issues: no issues, fills at [facility] Patient expressed understanding and was in agreement with this plan. She also understands that She can call clinic at any time with any questions, concerns, or complaints.   Follow-up plan: f/u in 1 month  Thank you for allowing me to participate in the care of this very pleasant patient.   Time Total: 20 mins  Visit consisted of counseling and education on dealing with issues of symptom management in the setting of serious and potentially life-threatening illness.Greater than 50%  of this time was spent counseling and coordinating care related to the above assessment and  plan.  Signed by: ADarl Pikes PharmD, BCPS, BSalley Slaughter CPP Hematology/Oncology Clinical Pharmacist Practitioner [facility]/HP/AP [phone] 10/10/2020 2:03 PM

## 2020-10-11 ENCOUNTER — Other Ambulatory Visit (HOSPITAL_COMMUNITY): Payer: Self-pay

## 2020-10-11 ENCOUNTER — Other Ambulatory Visit: Payer: Self-pay | Admitting: Oncology

## 2020-10-11 DIAGNOSIS — Z170 Estrogen receptor positive status [ER+]: Secondary | ICD-10-CM

## 2020-10-11 DIAGNOSIS — C50411 Malignant neoplasm of upper-outer quadrant of right female breast: Secondary | ICD-10-CM

## 2020-10-11 MED ORDER — ALPRAZOLAM 0.25 MG PO TABS: 0.2500 mg | ORAL_TABLET | Freq: Two times a day (BID) | ORAL | 0 refills | Status: DC | PRN

## 2020-10-11 NOTE — Telephone Encounter (Signed)
Original rx was printed but Dr. Grayland Ormond is not in the office t osign.  Rx re-entered and sent to Lennon Alstrom, NP to e-sign.

## 2020-10-15 ENCOUNTER — Other Ambulatory Visit: Payer: Self-pay | Admitting: Oncology

## 2020-10-17 ENCOUNTER — Other Ambulatory Visit: Payer: Self-pay | Admitting: Oncology

## 2020-10-17 ENCOUNTER — Other Ambulatory Visit: Payer: Self-pay

## 2020-10-17 ENCOUNTER — Ambulatory Visit
Admission: RE | Admit: 2020-10-17 | Discharge: 2020-10-17 | Disposition: A | Discharge status: Home / Self Care | Payer: BC Managed Care – PPO | Source: Ambulatory Visit | Attending: Oncology | Admitting: Oncology

## 2020-10-17 DIAGNOSIS — Z853 Personal history of malignant neoplasm of breast: Secondary | ICD-10-CM

## 2020-10-17 DIAGNOSIS — R921 Mammographic calcification found on diagnostic imaging of breast: Secondary | ICD-10-CM

## 2020-10-17 IMAGING — MG DIGITAL DIAGNOSTIC BILAT W/ TOMO W/ CAD
8 of 12 series · 8 of 28 positions shown · non-contrast
Comparison: Previous exam(s).

CLINICAL DATA: 39-year-old female presenting for annual exam.
History of right breast cancer in [AD] status post lumpectomy.

EXAM:
DIGITAL DIAGNOSTIC BILATERAL MAMMOGRAM WITH TOMOSYNTHESIS AND CAD
TECHNIQUE: Bilateral digital diagnostic mammography and breast tomosynthesis
was performed. The images were evaluated with computer-aided
detection.

[R ML (1 of 2)]
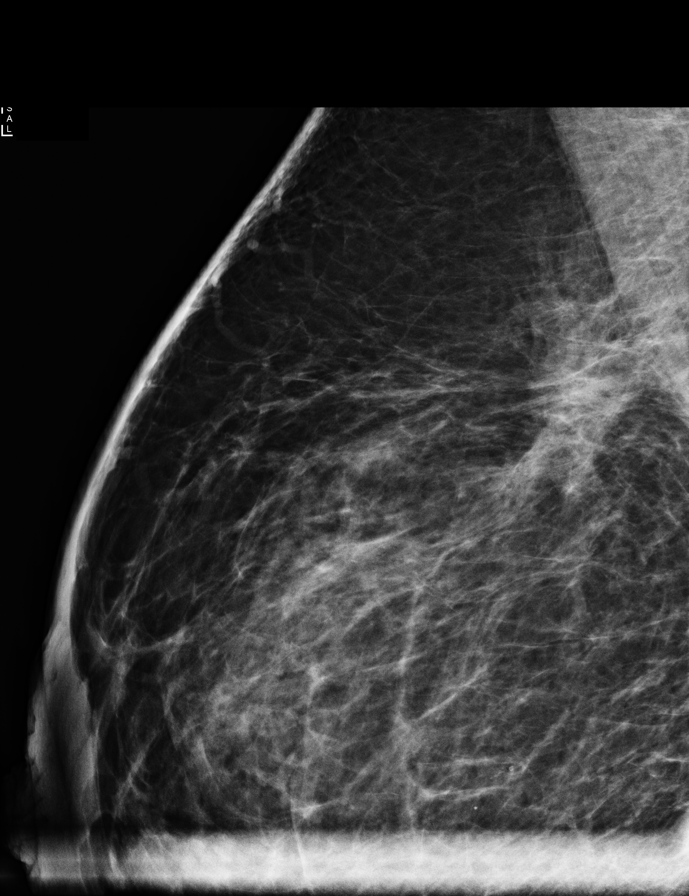

[R ML (2 of 2)]
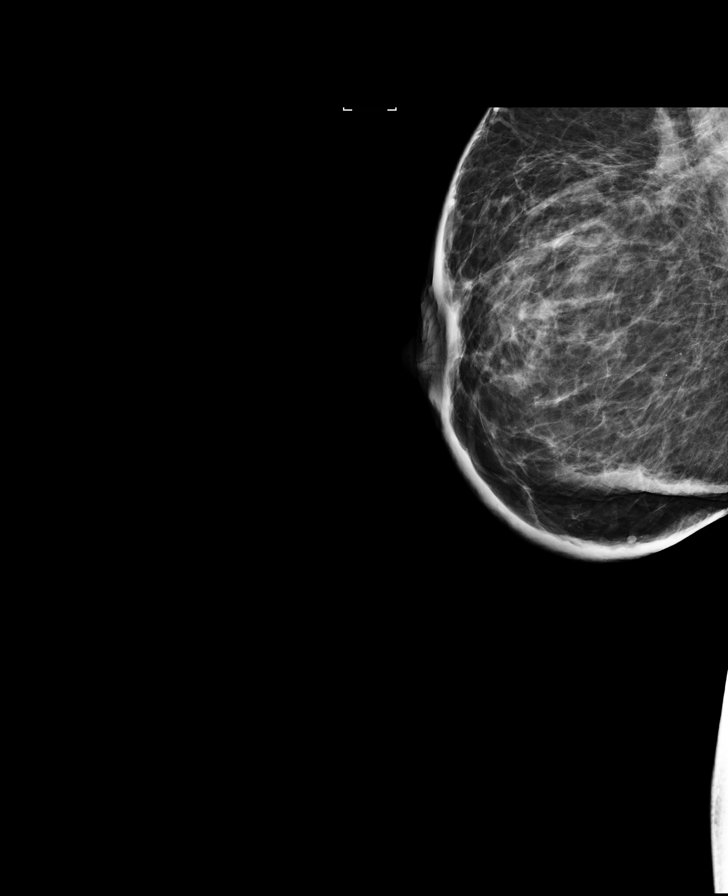

[R CC (1 of 2)]
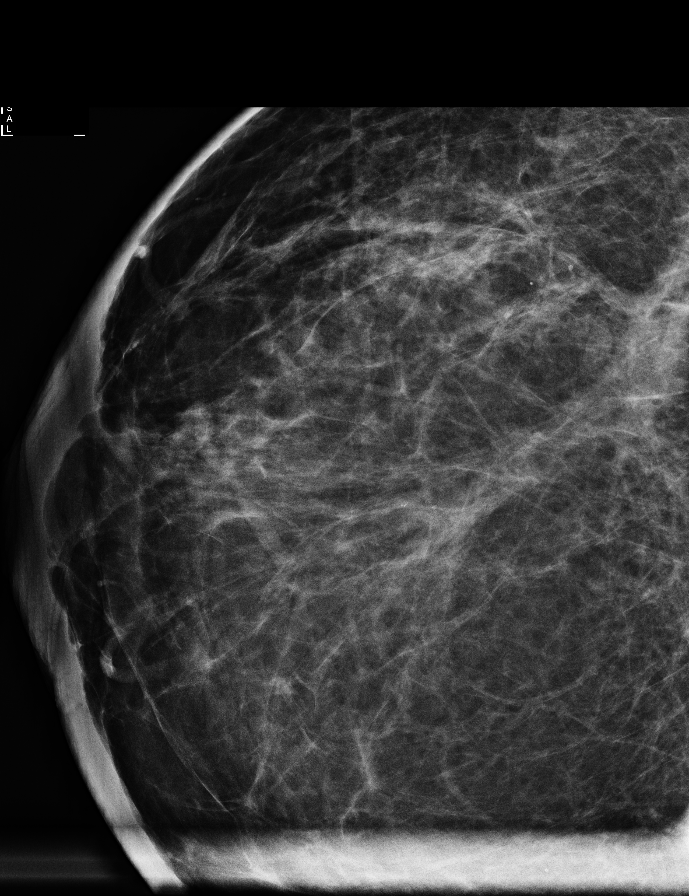

[R CC (2 of 2)]
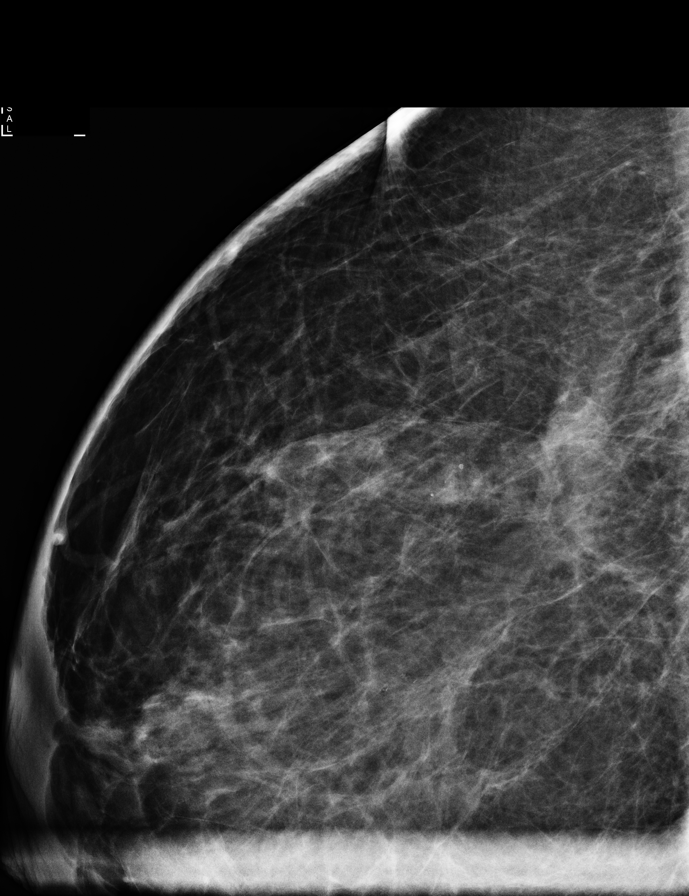

[L CC synth-2D]
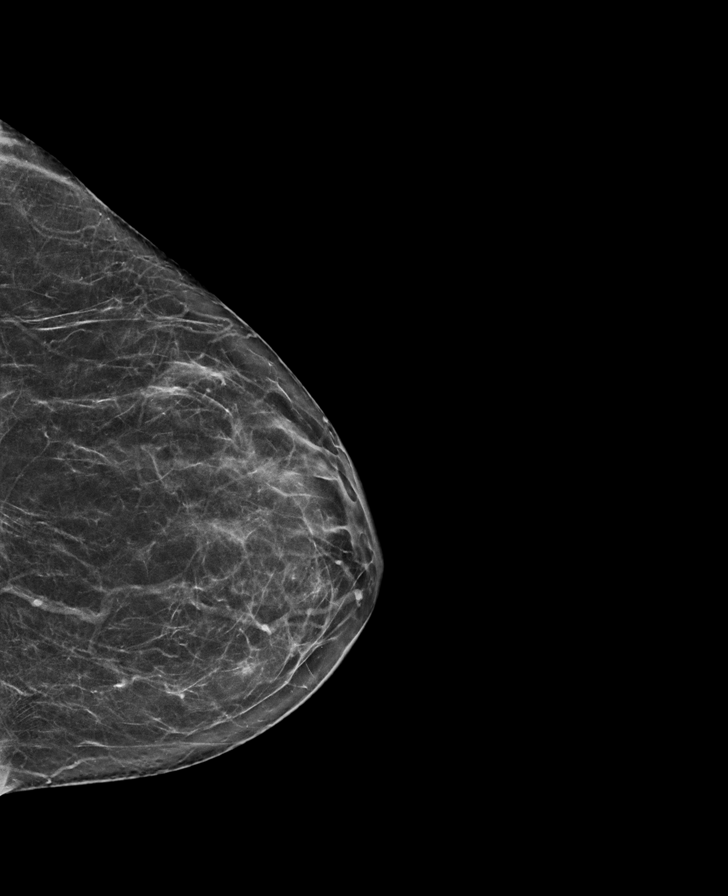

[L MLO synth-2D]
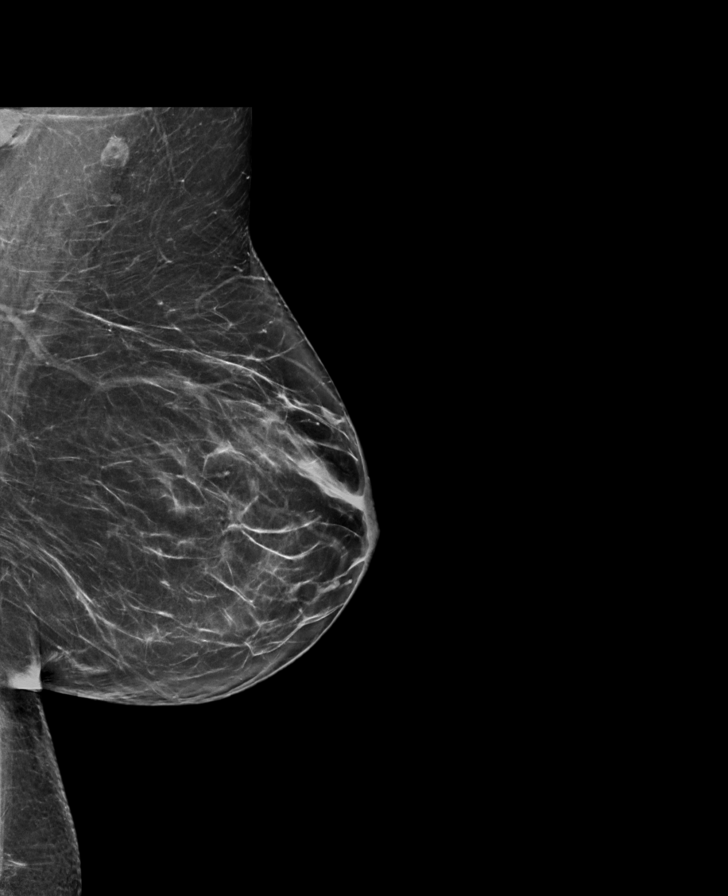

[R MLO synth-2D]
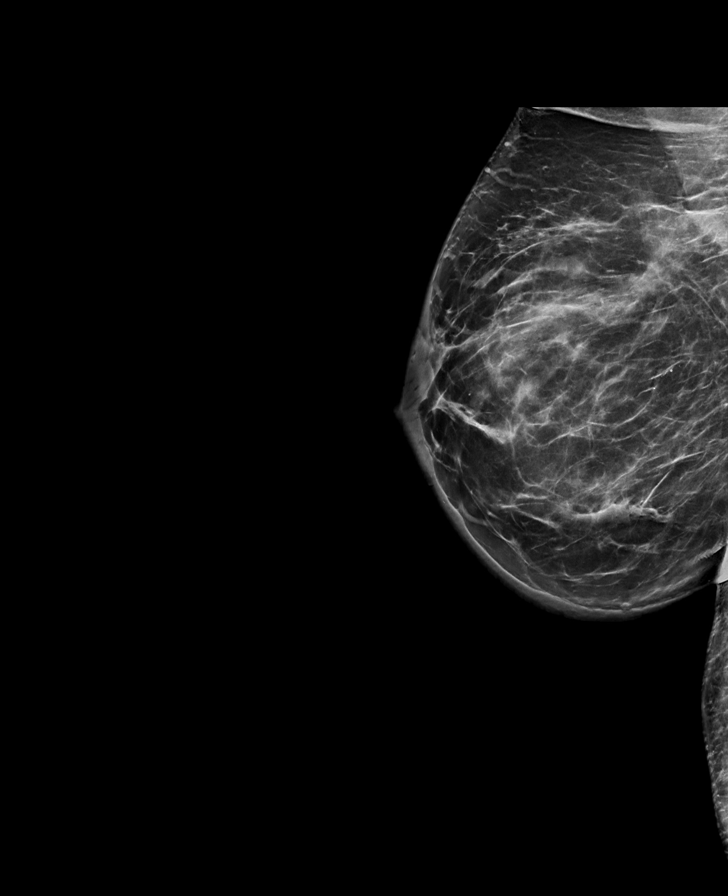

[R CC synth-2D]
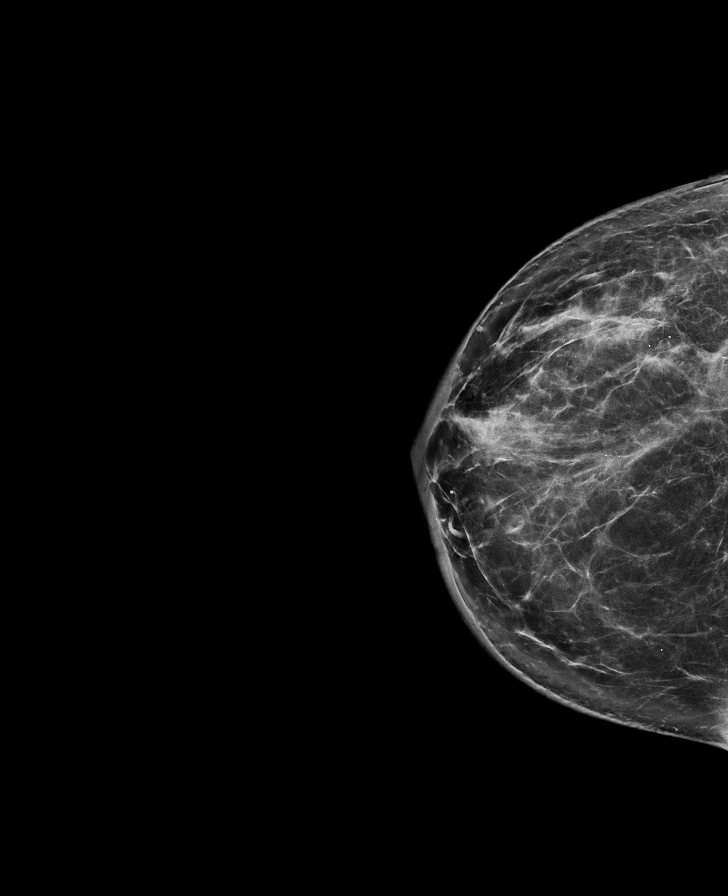

[8 of 28 positions shown; findings below may reference images not displayed]

ACR Breast Density Category c: The breast tissue is heterogeneously
dense, which may obscure small masses.
FINDINGS: Right breast: Spot 2D magnification views of the right breast were
performed in addition to standard views. There are grouped
calcifications in the outer right breast posterior depth with lucent
centers consistent with benign calcifications. There is another
group of faint linear calcifications in the central slightly
superior breast spanning approximately 0.5 cm, best seen on cc view.
The additional questioned calcifications in the anterior breast do
not persist on the spot imaging and were likely artifactual.

Left breast: No suspicious mass, distortion, or microcalcifications
are identified to suggest presence of malignancy.
IMPRESSION: 1. Indeterminate calcifications spanning 0.5 cm in the central
slightly superior right breast.

2. Additional group of benign lucent centered calcifications in th
outer right breast.

3.  No mammographic evidence of malignancy in the left breast.

RECOMMENDATION:
Stereotactic core needle biopsy x1 of the right breast central
superior calcifications.

I have discussed the findings and recommendations with the patient
who agrees to proceed with biopsy. The patient was scheduled for the
biopsy appointment prior to leaving the office today.

BI-RADS CATEGORY  4: Suspicious.

## 2020-10-22 ENCOUNTER — Telehealth: Payer: Self-pay

## 2020-10-22 NOTE — Telephone Encounter (Signed)
Current FMLA ends on 10/30/20.  New FMLA submitted for continuous leave to begin on 10/31/20 and end on 01/07/21.

## 2020-10-24 ENCOUNTER — Other Ambulatory Visit: Payer: Self-pay

## 2020-10-24 ENCOUNTER — Ambulatory Visit
Admission: RE | Admit: 2020-10-24 | Discharge: 2020-10-24 | Disposition: A | Discharge status: Home / Self Care | Payer: BC Managed Care – PPO | Source: Ambulatory Visit | Attending: Oncology | Admitting: Oncology

## 2020-10-24 DIAGNOSIS — R921 Mammographic calcification found on diagnostic imaging of breast: Secondary | ICD-10-CM

## 2020-10-24 IMAGING — MG MM BREAST BX W/ LOC DEV 1ST LESION IMAGE BX SPEC STEREO GUIDE*R*
8 of 19 series · 8 of 35 positions shown · non-contrast
Comparison: Previous exams.
COMPARISON: Previous exams.

Addendum:
CLINICAL DATA: 39-year-old female with new indeterminate 0.5 cm
group of calcifications in the central superior right breast near
her lumpectomy site.

EXAM:
RIGHT BREAST STEREOTACTIC CORE NEEDLE BIOPSY

[R (1 of 8)]
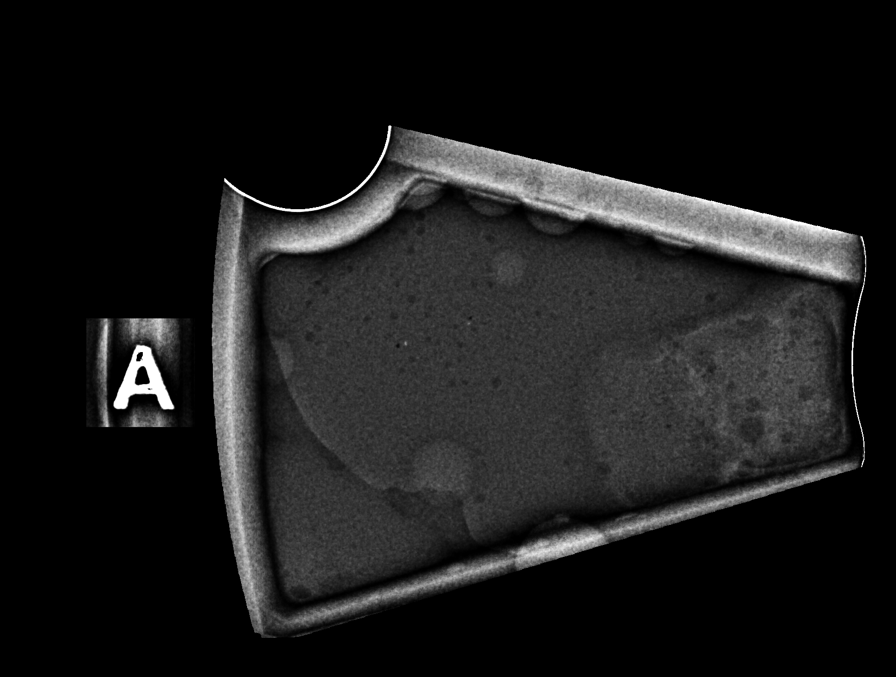

[R (2 of 8)]
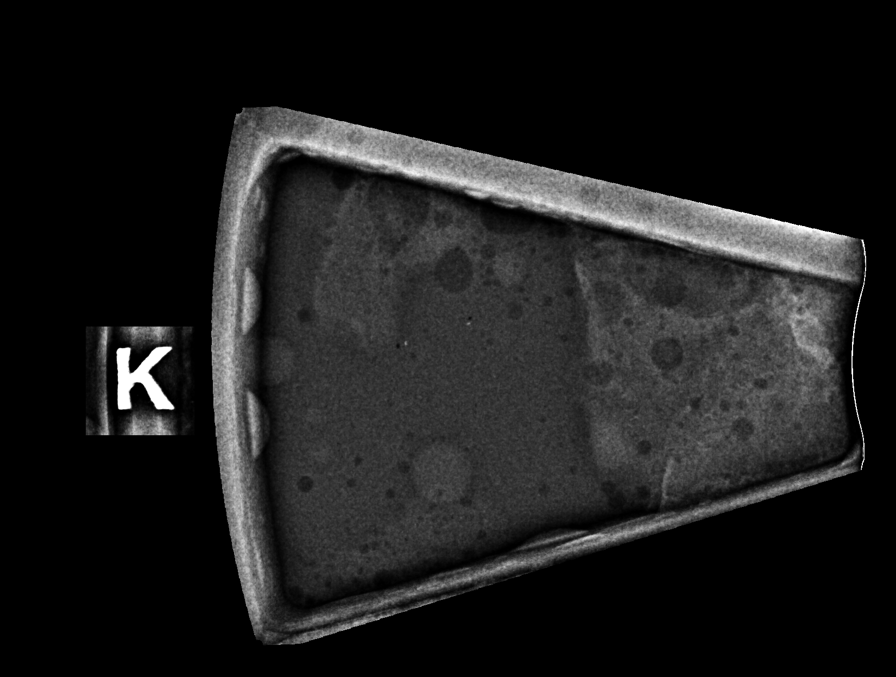

[R (3 of 8)]
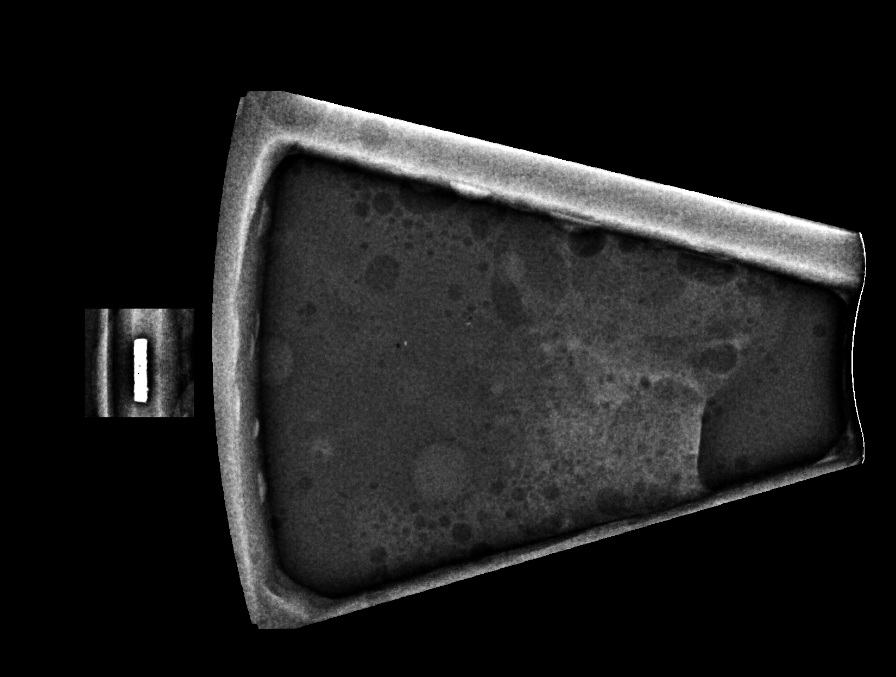

[R (4 of 8)]
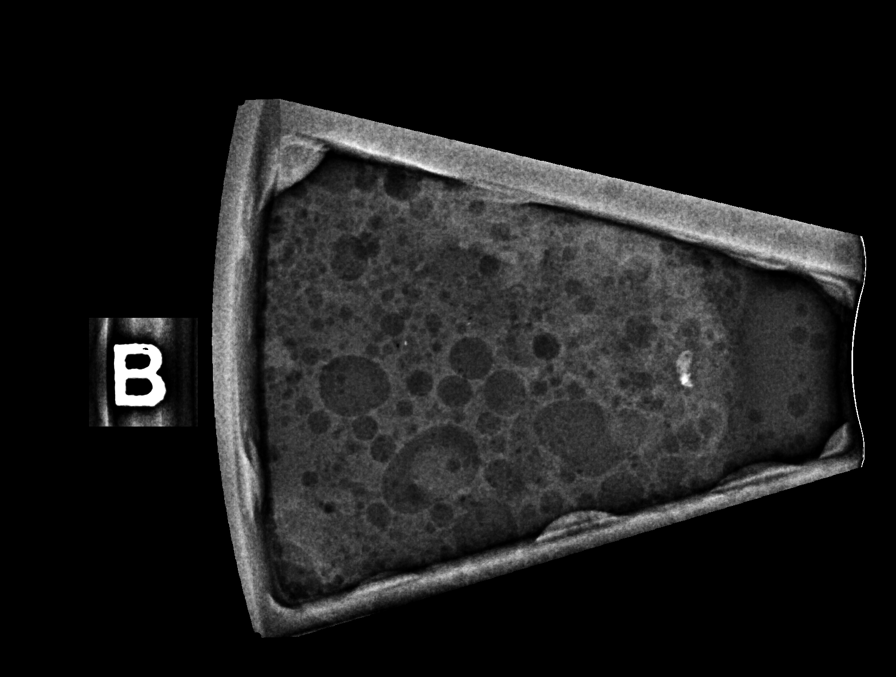

[R (5 of 8)]
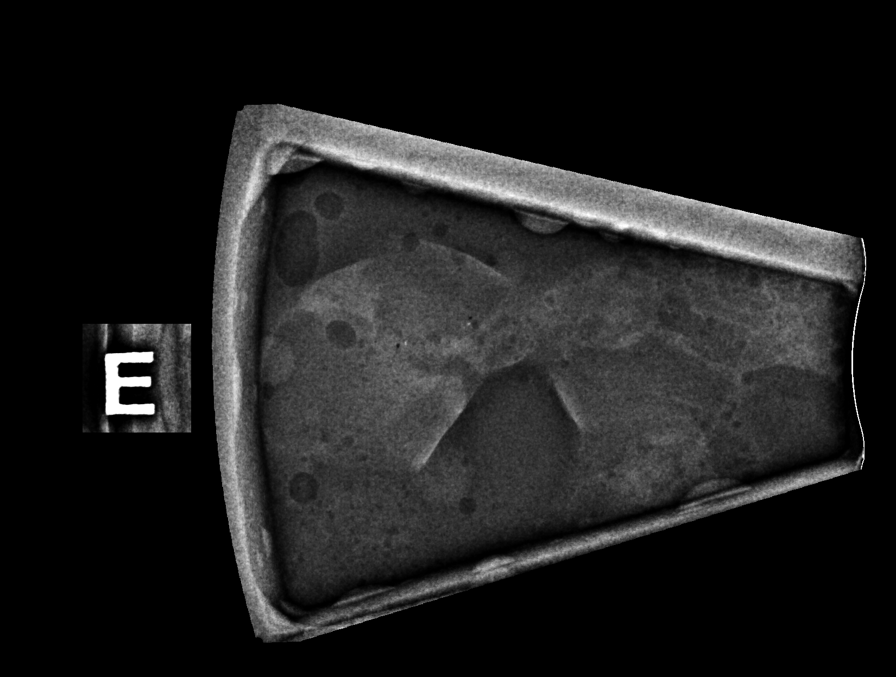

[R (6 of 8)]
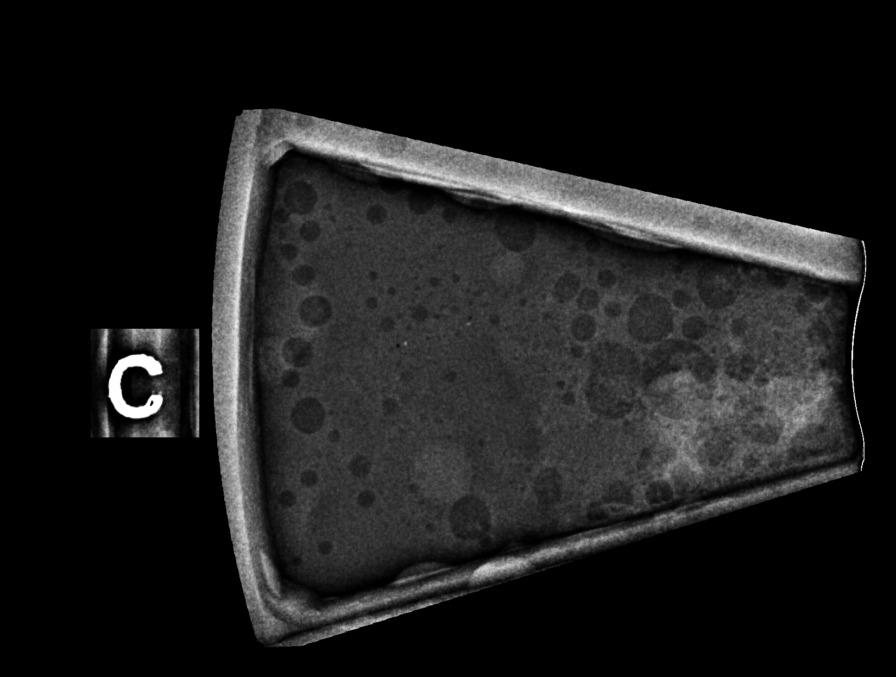

[R (7 of 8)]
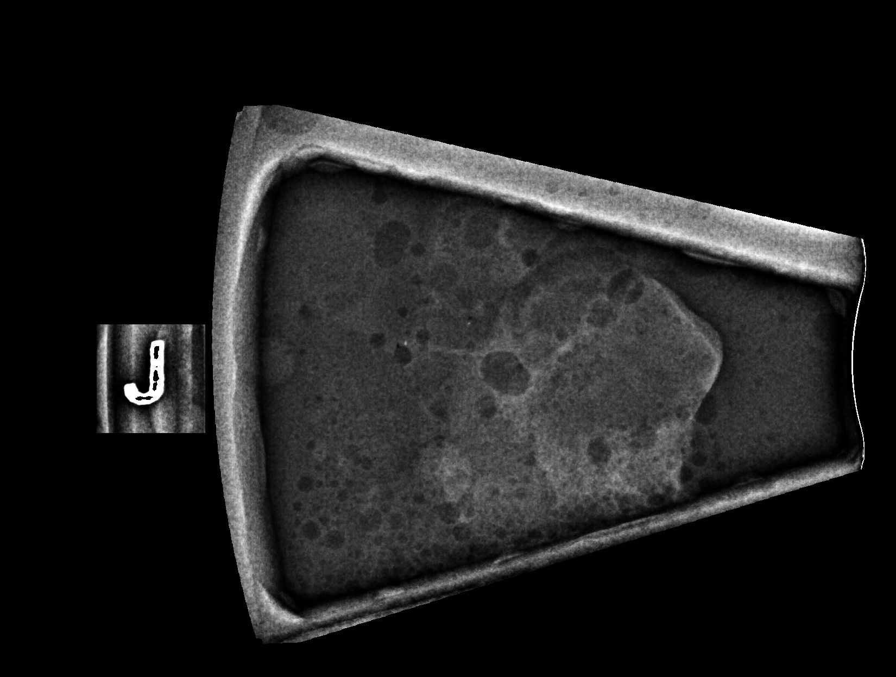

[R (8 of 8)]
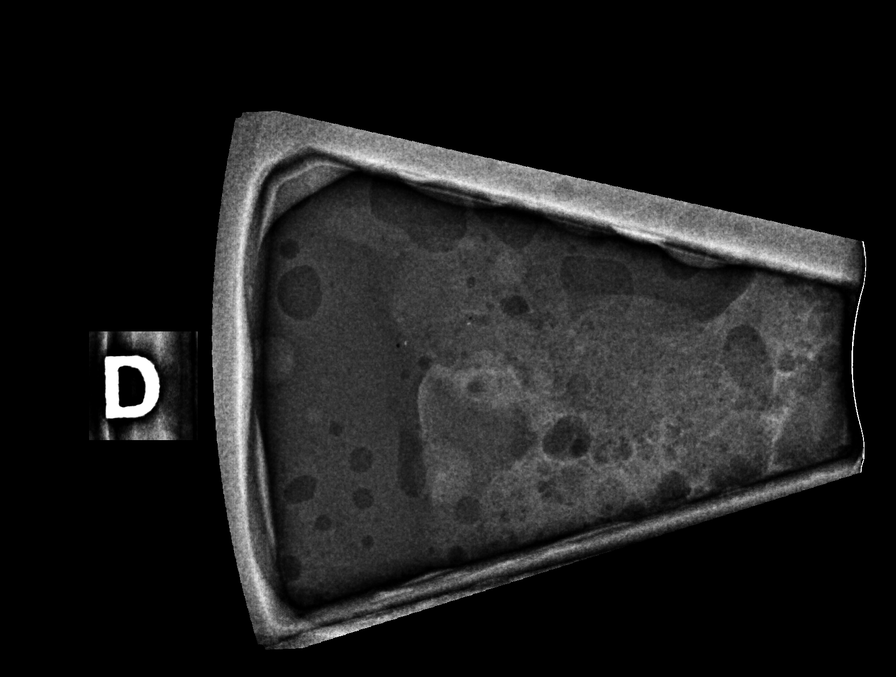

[8 of 35 positions shown; findings below may reference images not displayed]



Using sterile technique and 1% Lidocaine as local anesthetic, under
stereotactic guidance, a 9 gauge vacuum assisted device was used to
perform core needle biopsy of the calcifications in the central
superior right breast using a superior to inferior approach.
Specimen radiograph was performed showing at least 1 calcification.
Specimens with calcifications are identified for pathology.

Lesion quadrant: Upper outer

At the conclusion of the procedure, an X shaped shaped tissue marker
clip was deployed into the biopsy cavity. Follow-up 2-view mammogram
was performed and dictated separately.
IMPRESSION: Stereotactic-guided biopsy of calcifications in the superior central
right breast. No apparent complications.

ADDENDUM:
Pathology revealed DENSE FIBROSIS WITH PIGMENTED HISTIOCYTES AND
CALCIFICATIONS- NO MALIGNANCY IDENTIFIED of the RIGHT breast,
central superior, X clip. This was found to be concordant by Dr.
MOI.

Pathology results were discussed with the patient by telephone. The
patient reported doing well after the biopsy with tenderness at the
site. Post biopsy instructions and care were reviewed and questions
were answered. The patient was encouraged to call The [REDACTED]

The patient was instructed to return for right diagnostic
mammography and possible ultrasound in 6 months and informed a
reminder notice would be sent regarding this appointment.

Pathology results reported by MOI RN on [DATE].



Using sterile technique and 1% Lidocaine as local anesthetic, under
stereotactic guidance, a 9 gauge vacuum assisted device was used to
perform core needle biopsy of the calcifications in the central
superior right breast using a superior to inferior approach.
Specimen radiograph was performed showing at least 1 calcification.
Specimens with calcifications are identified for pathology.

Lesion quadrant: Upper outer

At the conclusion of the procedure, an X shaped shaped tissue marker
clip was deployed into the biopsy cavity. Follow-up 2-view mammogram
was performed and dictated separately.
IMPRESSION: Stereotactic-guided biopsy of calcifications in the superior central
right breast. No apparent complications.

## 2020-10-30 ENCOUNTER — Other Ambulatory Visit (HOSPITAL_COMMUNITY): Payer: Self-pay

## 2020-10-31 ENCOUNTER — Telehealth: Payer: Self-pay | Admitting: Oncology

## 2020-10-31 ENCOUNTER — Other Ambulatory Visit (HOSPITAL_COMMUNITY): Payer: Self-pay

## 2020-10-31 NOTE — Telephone Encounter (Signed)
Left pt detailed VM that PET scan scheduled for 10/31 had to be canceled due to insurance authorization. Pt instructed not to report for scan. Clinic will notify the patient when can has been approved for reschedule.

## 2020-11-01 ENCOUNTER — Other Ambulatory Visit (HOSPITAL_COMMUNITY): Payer: Self-pay

## 2020-11-01 NOTE — Progress Notes (Signed)
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  INTERVAL HISTORY: Patient returns to clinic today for repeat laboratory work, further evaluation, and continuation of treatment.  Her cough and shortness of breath have improved.  She continues to have mild insomnia and anxiety, but this has significantly improved as well.  She otherwise feels well. She has no neurologic complaints.  She has a good appetite.  She denies any chest pain or hemoptysis, but continues to have intermittent left flank pain.  She denies any nausea, vomiting, constipation, or diarrhea.  She has no urinary complaints.  Patient offers no further specific complaints today.  REVIEW OF SYSTEMS:   Review of Systems  Constitutional: Negative.  Negative for fever, malaise/fatigue and weight loss.  Respiratory: Negative.  Negative for cough, hemoptysis and shortness of breath.   Cardiovascular: Negative.  Negative for chest pain and leg swelling.  Gastrointestinal: Negative.  Negative for abdominal pain.  Genitourinary: Negative.  Negative for dysuria and flank pain.  Musculoskeletal: Negative.  Negative for back pain.  Skin: Negative.  Negative for rash.  Neurological: Negative.  Negative for dizziness, focal weakness, weakness and headaches.  Psychiatric/Behavioral: Negative.  The patient is not nervous/anxious and does not have insomnia.    As per HPI. Otherwise, a  complete review of systems is negative.  PAST MEDICAL HISTORY: Past Medical History:  Diagnosis Date   Anxiety    Family history of breast cancer    HSV (herpes simplex virus) anogenital infection    positive titer only    PAST SURGICAL HISTORY: Past Surgical History:  Procedure Laterality Date   BREAST LUMPECTOMY WITH AXILLARY LYMPH NODE BIOPSY Right 11/21/2018   Procedure: RIGHT BREAST LUMPECTOMY WITH SENTINEL LYMPH NODE BIOPSY;  Surgeon: Jovita Kussmaul, MD;  Location: [place];  Service: General;  Laterality: Right;   BREAST REDUCTION SURGERY Bilateral 11/21/2018   Procedure: BILATERAL MAMMARY REDUCTION  (BREAST);  Surgeon: Wallace Going, DO;  Location: [place];  Service: Plastics;  Laterality: Bilateral;   CHEST TUBE INSERTION Left 07/29/2020   Procedure: INSERTION PLEURAL DRAINAGE CATHETER;  Surgeon: Lajuana Matte, MD;  Location: [place];  Service: Thoracic;  Laterality: Left;   IRRIGATION AND DEBRIDEMENT STERNOCLAVICULAR JOINT-STERNUM AND RIBS N/A 08/13/2020   Procedure: IRRIGATION AND DEBRIDEMENT CHEST WALL ABSCESS;  Surgeon: Lajuana Matte, MD;  Location: [place];  Service: Cardiothoracic;  Laterality: N/A;   PLEURAL BIOPSY Left 07/29/2020   Procedure: PLEURAL BIOPSY;  Surgeon: Lajuana Matte, MD;  Location: [place];  Service: Thoracic;  Laterality: Left;   PLEURAL EFFUSION DRAINAGE Left 07/29/2020   Procedure: DRAINAGE OF PLEURAL EFFUSION;  Surgeon: Lajuana Matte, MD;  Location: [place];  Service: Thoracic;  Laterality: Left;   PORT-A-CATH REMOVAL N/A 07/06/2019   Procedure: REMOVAL PORT-A-CATH;  Surgeon: Jovita Kussmaul, MD;  Location: [place];  Service: General;  Laterality: N/A;   PORTACATH PLACEMENT N/A 11/21/2018   Procedure: INSERTION LEFT PORT-A-CATH WITH ULTRASOUND GUIDANCE;  Surgeon: Jovita Kussmaul, MD;  Location: [facility]  OR;  Service: General;  Laterality: N/A;   TONSILLECTOMY  1987   TONSILLECTOMY     VIDEO ASSISTED THORACOSCOPY Left  07/29/2020   Procedure: VIDEO ASSISTED THORACOSCOPY;  Surgeon: Lajuana Matte, MD;  Location: [facility];  Service: Thoracic;  Laterality: Left;   WISDOM TOOTH EXTRACTION      FAMILY HISTORY: Family History  Problem Relation Age of Onset   Hypertension Father    Alcohol abuse Paternal Grandfather    Heart disease Paternal Grandfather    Alcohol abuse Maternal Grandfather    Heart disease Maternal Grandmother    Breast cancer Other     ADVANCED DIRECTIVES (Y/N):  N  HEALTH MAINTENANCE: Social History   Tobacco Use   Smoking status: Former    Packs/day: 0.50    Years: 20.00    Pack years: 10.00    Types: Cigarettes    Start date: 01/13/2002    Quit date: 10/17/2016    Years since quitting: 4.0   Smokeless tobacco: Never  Vaping Use   Vaping Use: Never used  Substance Use Topics   Alcohol use: Yes    Comment: Socially   Drug use: No     Colonoscopy:  PAP:  Bone density:  Lipid panel:  Allergies  Allergen Reactions   Betadine [Povidone-Iodine]     Current Outpatient Medications  Medication Sig Dispense Refill   acetaminophen (TYLENOL) 325 MG tablet Take 2 tablets (650 mg total) by mouth every 6 (six) hours as needed for mild pain (or Fever >/= 101).     albuterol (VENTOLIN HFA) 108 (90 Base) MCG/ACT inhaler INHALE 2 PUFFS INTO THE LUNGS EVERY 6 HOURS AS NEEDED FOR WHEEZING OR SHORTNESS OF BREATH 6.7 g 2   ALPRAZolam (XANAX) 0.25 MG tablet Take 1 tablet (0.25 mg total) by mouth 2 (two) times daily as needed for anxiety. 60 tablet 0   amphetamine-dextroamphetamine (ADDERALL XR) 30 MG 24 hr capsule Take 30 mg by mouth daily.     amphetamine-dextroamphetamine (ADDERALL) 10 MG tablet Take 10 mg by mouth daily in the afternoon.     Ascorbic Acid (VITAMIN C) 500 MG CAPS Take 500 mg by mouth daily.     hydrOXYzine (VISTARIL) 25 MG capsule TAKE 1 CAPSULE(25 MG) BY MOUTH AT BEDTIME AS NEEDED FOR ANXIETY OR SLEEP 30 capsule 0   ibuprofen (ADVIL) 200 MG tablet Take 400 mg by  mouth every 6 (six) hours as needed for mild pain.     letrozole (FEMARA) 2.5 MG tablet Take 1 tablet (2.5 mg total) by mouth daily. 90 tablet 3   Multiple Vitamin (MULTIVITAMIN WITH MINERALS) TABS tablet Take 1 tablet by mouth daily.     palbociclib (IBRANCE) 125 MG tablet Take 1 tablet (125 mg total) by mouth daily. Take for 21 days on, 7 days off, repeat every 28 days. 21 tablet 2   polyethylene glycol (MIRALAX / GLYCOLAX) 17 g packet Take 17 g by mouth daily. 14 each 0   Tetrahydrozoline HCl (VISINE OP) Place 1 drop into both eyes daily as needed (redness).     venlafaxine XR (EFFEXOR-XR) 150 MG 24 hr capsule TAKE 1 CAPSULE(150 MG) BY MOUTH DAILY WITH BREAKFAST 30 capsule 1   vitamin B-12 (CYANOCOBALAMIN) 1000 MCG tablet Take 1,000 mcg by mouth daily.     Calcipotriene-Betameth Diprop 0.005-0.064 % CREA Apply topically. (Patient not taking: No sig reported)     HYDROcodone-acetaminophen (NORCO) 10-325 MG tablet Take 1-2 tablets by mouth every 4 (four) hours as needed for  moderate pain. (Patient not taking: No sig reported) 60 tablet 0   hydrocortisone cream 1 % Apply 1 application topically 2 (two) times daily. (Patient not taking: No sig reported) 30 g 0   No current facility-administered medications for this visit.    OBJECTIVE: Vitals:   11/07/20 1344  BP: 127/84  Pulse: (!) 110  Resp: 18  Temp: 97.9 F (36.6 C)  SpO2: 99%      Body mass index is 28.72 kg/m.    ECOG FS:0 - Asymptomatic  General: Well-developed, well-nourished, no acute distress. Eyes: Pink conjunctiva, anicteric sclera. HEENT: Normocephalic, moist mucous membranes. Lungs: No audible wheezing or coughing. Heart: Regular rate and rhythm. Abdomen: Soft, nontender, no obvious distention. Musculoskeletal: No edema, cyanosis, or clubbing. Neuro: Alert, answering all questions appropriately. Cranial nerves grossly intact. Skin: No rashes or petechiae noted. Psych: Normal affect.  LAB RESULTS:  Lab Results   Component Value Date   NA 136 11/07/2020   K 4.1 11/07/2020   CL 97 (L) 11/07/2020   CO2 28 11/07/2020   GLUCOSE 107 (H) 11/07/2020   BUN 16 11/07/2020   CREATININE 0.67 11/07/2020   CALCIUM 9.9 11/07/2020   PROT 8.9 (H) 11/07/2020   ALBUMIN 4.7 11/07/2020   AST 45 (H) 11/07/2020   ALT 66 (H) 11/07/2020   ALKPHOS 89 11/07/2020   BILITOT 0.6 11/07/2020   GFRNONAA >60 11/07/2020   GFRAA >60 08/07/2019    Lab Results  Component Value Date   WBC 4.5 11/07/2020   NEUTROABS 2.4 11/07/2020   HGB 13.7 11/07/2020   HCT 40.0 11/07/2020   MCV 93.9 11/07/2020   PLT 299 11/07/2020     STUDIES: MM DIAG BREAST TOMO BILATERAL  Result Date: 10/17/2020 CLINICAL DATA:  40 year old female presenting for annual exam. History of right breast cancer in 2020 status post lumpectomy. EXAM: DIGITAL DIAGNOSTIC BILATERAL MAMMOGRAM WITH TOMOSYNTHESIS AND CAD TECHNIQUE: Bilateral digital diagnostic mammography and breast tomosynthesis was performed. The images were evaluated with computer-aided detection. COMPARISON:  Previous exam(s). ACR Breast Density Category c: The breast tissue is heterogeneously dense, which may obscure small masses. FINDINGS: Right breast: Spot 2D magnification views of the right breast were performed in addition to standard views. There are grouped calcifications in the outer right breast posterior depth with lucent centers consistent with benign calcifications. There is another group of faint linear calcifications in the central slightly superior breast spanning approximately 0.5 cm, best seen on cc view. The additional questioned calcifications in the anterior breast do not persist on the spot imaging and were likely artifactual. Left breast: No suspicious mass, distortion, or microcalcifications are identified to suggest presence of malignancy. IMPRESSION: 1. Indeterminate calcifications spanning 0.5 cm in the central slightly superior right breast. 2. Additional group of benign  lucent centered calcifications in th outer right breast. 3.  No mammographic evidence of malignancy in the left breast. RECOMMENDATION: Stereotactic core needle biopsy x1 of the right breast central superior calcifications. I have discussed the findings and recommendations with the patient who agrees to proceed with biopsy. The patient was scheduled for the biopsy appointment prior to leaving the office today. BI-RADS CATEGORY  4: Suspicious. Electronically Signed   By: Audie Pinto M.D.   On: 10/17/2020 15:54  MM CLIP PLACEMENT RIGHT  Result Date: 10/24/2020 CLINICAL DATA:  Post stereotactic guided biopsy of calcifications in the central superior right breast. EXAM: 3D DIAGNOSTIC RIGHT MAMMOGRAM POST STEREOTACTIC BIOPSY COMPARISON:  Previous exam(s). FINDINGS: 3D Mammographic images were obtained following stereotactic  guided biopsy of calcifications in the central superior right breast. An X shaped biopsy marking clip is present and displaced approximately 2 cm inferior and slightly lateral to the biopsied calcifications in the superior central right breast. IMPRESSION: X shaped biopsy marking clip displaced approximately 2 cm inferior and slightly lateral to the biopsied calcifications in the superior central right breast. Final Assessment: Post Procedure Mammograms for Marker Placement Electronically Signed   By: Everlean Alstrom M.D.   On: 10/24/2020 10:24  MM RT BREAST BX W LOC DEV 1ST LESION IMAGE BX SPEC STEREO GUIDE  Addendum Date: 10/29/2020   ADDENDUM REPORT: 10/29/2020 07:34 ADDENDUM: Pathology revealed DENSE FIBROSIS WITH PIGMENTED HISTIOCYTES AND CALCIFICATIONS- NO MALIGNANCY IDENTIFIED of the RIGHT breast, central superior, X clip. This was found to be concordant by Dr. Everlean Alstrom. Pathology results were discussed with the patient by telephone. The patient reported doing well after the biopsy with tenderness at the site. Post biopsy instructions and care were reviewed and questions  were answered. The patient was encouraged to call [facility] for any additional concerns. The patient was instructed to return for right diagnostic mammography and possible ultrasound in 6 months and informed a reminder notice would be sent regarding this appointment. Pathology results reported by Stacie Acres RN on 10/25/2020. Electronically Signed   By: Everlean Alstrom M.D.   On: 10/29/2020 07:34   Result Date: 10/29/2020 CLINICAL DATA:  40 year old female with new indeterminate 0.5 cm group of calcifications in the central superior right breast near her lumpectomy site. EXAM: RIGHT BREAST STEREOTACTIC CORE NEEDLE BIOPSY COMPARISON:  Previous exams. FINDINGS: The patient and I discussed the procedure of stereotactic-guided biopsy including benefits and alternatives. We discussed the high likelihood of a successful procedure. We discussed the risks of the procedure including infection, bleeding, tissue injury, clip migration, and inadequate sampling. Informed written consent was given. The usual time out protocol was performed immediately prior to the procedure. Using sterile technique and 1% Lidocaine as local anesthetic, under stereotactic guidance, a 9 gauge vacuum assisted device was used to perform core needle biopsy of the calcifications in the central superior right breast using a superior to inferior approach. Specimen radiograph was performed showing at least 1 calcification. Specimens with calcifications are identified for pathology. Lesion quadrant: Upper outer At the conclusion of the procedure, an X shaped shaped tissue marker clip was deployed into the biopsy cavity. Follow-up 2-view mammogram was performed and dictated separately. IMPRESSION: Stereotactic-guided biopsy of calcifications in the superior central right breast. No apparent complications. Electronically Signed: By: Everlean Alstrom M.D. On: 10/24/2020 10:22   ONCOLOGY HISTORY: Patient initially self  palpated a lump in the 12 o'clock position of her right breast and subsequently underwent biopsy on September 26, 2018 confirming malignancy.  Initial MammaPrint was reported high risk.  She underwent right lumpectomy on November 21, 2018 which revealed a T2, N1, M0 stage IIa malignancy.  1 of 4 lymph nodes were positive for disease.  She subsequently underwent adjuvant chemotherapy with AC/Taxol completing treatment on Jun 01, 2019.  She then completed adjuvant XRT on August 11, 2019 and was started on tamoxifen.  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  She has noted to have retrocrural lymph node with definite involvement and suspicious retroperitoneal and supraclavicular nodes.  She had equivocal hypermetabolism in her liver.  MRI of the brain on August 18, 2020 was negative for metastatic disease.  Patient also underwent second opinion at [facility] in [place].  Agree with recommendation to proceed with Lupron 3.75 mg every 28 days for up to 24 months for ovarian suppression along with an aromatase inhibitor and Ibrance 125 mg for 21 days with 7 days off.  She has been instructed to discontinue tamoxifen.  Can also consider capecitabine or [place] in the future if treatment is not tolerated or patient has progression of disease.  Proceed with cycle 4 of Ibrance and Lupron today.  Continue daily letrozole.  Return to clinic in 4 weeks for further evaluation and continuation of treatment.  Patient requires repeat imaging in the near future, but PET scan was denied by insurance.  Currently working on appeals and will schedule appropriate imaging when  possible.   Cough/shortness of breath: Improved.  Continue Tussionex, hydrocodone, and albuterol inhaler.  Recent chest x-ray was unchanged.  Ultrasound did not reveal a significant amount of fluid for thoracentesis.   Anasarca/fluid retention: Resolved.   Pleural abscess: Treated and drained by thoracic surgery in [place].  Resolved.  Chest x-ray and possible thoracentesis as above.   Thrombocytosis: Resolved. Hypercalcemia: Resolved. Transaminitis: Chronic and unchanged, monitor.  Patient expressed understanding and was in agreement with this plan. She also understands that She can call clinic at any time with any questions, concerns, or complaints.    Cancer Staging Malignant neoplasm of upper-outer quadrant of right breast in female, estrogen receptor positive ([place]) Staging form: Breast, AJCC 8th Edition - Clinical stage from 10/05/2018: Stage IB (cT2, cN0, cM0, G2, ER+, PR+, HER2-) - Signed by Chauncey Cruel, MD on 04/02/2020 Stage prefix: Initial diagnosis Histologic grading system: 3 grade system - Pathologic stage from 08/09/2020: No Stage Recommended (ypT2, pN1a, pM1, G2, ER+, PR+, HER2-) - Signed by Lloyd Huger, MD on 08/09/2020 Stage prefix: Post-therapy Multigene prognostic tests performed: MammaPrint Histologic grading system: 3 grade system   Lloyd Huger, MD   11/07/2020 3:07 PM

## 2020-11-04 ENCOUNTER — Ambulatory Visit: Admission: RE | Admit: 2020-11-04 | Payer: BC Managed Care – PPO | Source: Ambulatory Visit

## 2020-11-07 ENCOUNTER — Inpatient Hospital Stay: Payer: BC Managed Care – PPO

## 2020-11-07 ENCOUNTER — Other Ambulatory Visit: Payer: Self-pay

## 2020-11-07 ENCOUNTER — Inpatient Hospital Stay (HOSPITAL_BASED_OUTPATIENT_CLINIC_OR_DEPARTMENT_OTHER): Payer: BC Managed Care – PPO | Admitting: Oncology

## 2020-11-07 ENCOUNTER — Inpatient Hospital Stay: Payer: BC Managed Care – PPO | Admitting: Pharmacist

## 2020-11-07 ENCOUNTER — Inpatient Hospital Stay: Payer: BC Managed Care – PPO | Attending: Oncology

## 2020-11-07 ENCOUNTER — Other Ambulatory Visit: Payer: Self-pay | Admitting: Emergency Medicine

## 2020-11-07 VITALS — BP 127/84 | HR 110 | Temp 97.9°F | Resp 18 | Wt 167.3 lb

## 2020-11-07 DIAGNOSIS — C50411 Malignant neoplasm of upper-outer quadrant of right female breast: Secondary | ICD-10-CM | POA: Insufficient documentation

## 2020-11-07 DIAGNOSIS — Z170 Estrogen receptor positive status [ER+]: Secondary | ICD-10-CM | POA: Insufficient documentation

## 2020-11-07 DIAGNOSIS — C7800 Secondary malignant neoplasm of unspecified lung: Secondary | ICD-10-CM | POA: Insufficient documentation

## 2020-11-07 DIAGNOSIS — Z79811 Long term (current) use of aromatase inhibitors: Secondary | ICD-10-CM | POA: Insufficient documentation

## 2020-11-07 LAB — COMPREHENSIVE METABOLIC PANEL
ALT: 66 U/L — ABNORMAL HIGH (ref 0–44)
AST: 45 U/L — ABNORMAL HIGH (ref 15–41)
Albumin: 4.7 g/dL (ref 3.5–5.0)
Alkaline Phosphatase: 89 U/L (ref 38–126)
Anion gap: 11 (ref 5–15)
BUN: 16 mg/dL (ref 6–20)
CO2: 28 mmol/L (ref 22–32)
Calcium: 9.9 mg/dL (ref 8.9–10.3)
Chloride: 97 mmol/L — ABNORMAL LOW (ref 98–111)
Creatinine, Ser: 0.67 mg/dL (ref 0.44–1.00)
GFR, Estimated: >60 mL/min (ref >60)
Glucose, Bld: 107 mg/dL — ABNORMAL HIGH (ref 70–99)
Potassium: 4.1 mmol/L (ref 3.5–5.1)
Sodium: 136 mmol/L (ref 135–145)
Total Bilirubin: 0.6 mg/dL (ref 0.3–1.2)
Total Protein: 8.9 g/dL — ABNORMAL HIGH (ref 6.5–8.1)

## 2020-11-07 LAB — CBC WITH DIFFERENTIAL/PLATELET
Abs Immature Granulocytes: 0.01 10*3/uL (ref 0.00–0.07)
Basophils Absolute: 0.1 10*3/uL (ref 0.0–0.1)
Basophils Relative: 1 %
Eosinophils Absolute: 0.1 10*3/uL (ref 0.0–0.5)
Eosinophils Relative: 1 %
HCT: 40 % (ref 36.0–46.0)
Hemoglobin: 13.7 g/dL (ref 12.0–15.0)
Immature Granulocytes: 0 %
Lymphocytes Relative: 30 %
Lymphs Abs: 1.3 10*3/uL (ref 0.7–4.0)
MCH: 32.2 pg (ref 26.0–34.0)
MCHC: 34.3 g/dL (ref 30.0–36.0)
MCV: 93.9 fL (ref 80.0–100.0)
Monocytes Absolute: 0.6 10*3/uL (ref 0.1–1.0)
Monocytes Relative: 13 %
Neutro Abs: 2.4 10*3/uL (ref 1.7–7.7)
Neutrophils Relative %: 55 %
Platelets: 299 10*3/uL (ref 150–400)
RBC: 4.26 MIL/uL (ref 3.87–5.11)
RDW: 17 % — ABNORMAL HIGH (ref 11.5–15.5)
WBC: 4.5 10*3/uL (ref 4.0–10.5)
nRBC: 0 % (ref 0.0–0.2)

## 2020-11-07 LAB — MAGNESIUM: Magnesium: 2 mg/dL (ref 1.7–2.4)

## 2020-11-07 MED ORDER — LEUPROLIDE ACETATE 3.75 MG IM KIT
3.7500 mg | PACK | Freq: Once | INTRAMUSCULAR | Status: AC
  Administered 2020-11-07: 3.75 mg via INTRAMUSCULAR
  Filled 2020-11-07: qty 3.75

## 2020-11-07 NOTE — Progress Notes (Signed)
[place]  Telephone:([phone] [phone]  Patient Care Team: Dion Body, MD as PCP - General (Family Medicine) Gery Pray, MD as Consulting Physician (Radiation Oncology) Jovita Kussmaul, MD as Consulting Physician (General Surgery) Dillingham, Loel Lofty, DO as Attending Physician (Plastic Surgery) Lloyd Huger, MD as Consulting Physician (Oncology)   Name of the patient: Kaylee Romero  [id number]  [birth year]   Date of visit: 11/07/20  HPI: Patient is a 40 y.o. female with recurrent metastatic breast cancer. Currently treated with Ibrance (palbociclib), letrozole, and leuprolide (ovarian suppression).   Reason for Consult: Oral chemotherapy follow-up for palbociclib therapy.   PAST MEDICAL HISTORY: Past Medical History:  Diagnosis Date   Anxiety    Family history of breast cancer    HSV (herpes simplex virus) anogenital infection    positive titer only    HEMATOLOGY/ONCOLOGY HISTORY:  Oncology History  Malignant neoplasm of upper-outer quadrant of right breast in female, estrogen receptor positive ([place])  09/29/2018 Initial Diagnosis   Malignant neoplasm of upper-outer quadrant of right breast in female, estrogen receptor positive ([place])   09/2018 -  Anti-estrogen oral therapy   Tamoxifen; held from 12/05/2018-09/06/2019 for surgery   10/05/2018 Cancer Staging   Staging form: Breast, AJCC 8th Edition - Clinical stage from 10/05/2018: Stage IB (cT2, cN0, cM0, G2, ER+, PR+, HER2-) - Signed by Chauncey Cruel, MD on 04/02/2020 Stage prefix: Initial diagnosis Histologic grading system: 3 grade system    10/13/2018 Genetic Testing   Negative genetic testing. No pathogenic variants identified on the [facility] Breast Cancer STAT Panel + Common Hereditary Cancers Panel. The Common Hereditary Cancers Panel offered by [facility] includes sequencing and/or deletion duplication testing of the following 47 genes:  APC, ATM, AXIN2, BARD1, BMPR1A, BRCA1, BRCA2, BRIP1, CDH1, CDKN2A (p14ARF), CDKN2A (p16INK4a), CKD4, CHEK2, CTNNA1, DICER1, EPCAM (Deletion/duplication testing only), GREM1 (promoter region deletion/duplication testing only), KIT, MEN1, MLH1, MSH2, MSH3, MSH6, MUTYH, NBN, NF1, NHTL1, PALB2, PDGFRA, PMS2, POLD1, POLE, PTEN, RAD50, RAD51C, RAD51D, SDHB, SDHC, SDHD, SMAD4, SMARCA4. STK11, TP53, TSC1, TSC2, and VHL.  The following genes were evaluated for sequence changes only: SDHA and HOXB13 c.251G>A variant only. The report date is 10/13/2018.    11/21/2018 Surgery   Right lumpectomy [name]) [phone]): IDC, grade 2, 2.6 cm, with DCIS. Negative margins. 1/4 lymph nodes positive for macrometastasis. ER/PR positive, HER-2 negative.   12/26/2018 - 06/01/2019 Chemotherapy   dexamethasone (DECADRON) 4 MG tablet, 1 of 1 cycle, Start date: 12/05/2018, End date: 03/02/2019  DOXOrubicin (ADRIAMYCIN) chemo injection 114 mg, 60 mg/m2 = 114 mg, Intravenous,  Once, 4 of 4 cycles. Administration: 114 mg (01/05/2019), 114 mg (01/19/2019), 114 mg (02/02/2019), 114 mg (02/15/2019)  palonosetron (ALOXI) injection 0.25 mg, 0.25 mg, Intravenous,  Once, 1 of 1 cycle. Administration: 0.25 mg (01/05/2019)  pegfilgrastim (NEULASTA ONPRO KIT) injection 6 mg, 6 mg, Subcutaneous, Once, 2 of 2 cycles  pegfilgrastim-jmdb (FULPHILA) injection 6 mg, 6 mg, Subcutaneous,  Once, 4 of 4 cycles. Administration: 6 mg (01/07/2019), 6 mg (01/21/2019), 6 mg (02/06/2019), 6 mg (02/17/2019)  cyclophosphamide (CYTOXAN) 1,140 mg in sodium chloride 0.9 % 250 mL chemo infusion, 600 mg/m2 = 1,140 mg, Intravenous,  Once, 4 of 4 cycles. Administration: 1,140 mg (01/05/2019), 1,140 mg (01/19/2019), 1,140 mg (02/02/2019), 1,140 mg (02/15/2019).  PACLitaxel (TAXOL) 150 mg in sodium chloride 0.9 % 250 mL chemo infusion (</= $RemoveBefor'80mg'odZnvSwLPtVi$ /m2), 80 mg/m2 = 150 mg, Intravenous,  Once, 12 of 12 cycles. Administration: 150 mg (03/02/2019), 150 mg (  03/09/2019), 150 mg (03/23/2019),  150 mg (03/30/2019), 150 mg (04/06/2019), 150 mg (04/20/2019), 150 mg (04/27/2019), 150 mg (05/03/2019), 150 mg (05/11/2019), 150 mg (05/18/2019), 150 mg (05/25/2019), 150 mg (06/01/2019)  fosaprepitant (EMEND) 150 mg  dexamethasone (DECADRON) 12 mg in sodium chloride 0.9 % 145 mL IVPB, , Intravenous,  Once, 4 of 4 cycles. Administration:  (01/05/2019),  (01/19/2019),  (02/02/2019),  (02/15/2019).   06/21/2019 - 08/11/2019 Radiation Therapy   The patient initially received a dose of 50.4 Gy in 28 fractions to the breast using whole-breast tangent fields. This was delivered using a 3-D conformal technique. The pt received a boost delivering an additional 16 Gy in 8 fractions using a electron boost with 59meV electrons. The total dose was 66.4 Gy.    11/12/2019 Cancer Staging   Staging form: Breast, AJCC 8th Edition - Pathologic: Stage IB (pT2, pN1a, cM0, G2, ER+, PR+, HER2-)     Oncotype testing   MammaPrint high risk     ALLERGIES:  is allergic to betadine [povidone-iodine].  MEDICATIONS:  Current Outpatient Medications  Medication Sig Dispense Refill   albuterol (VENTOLIN HFA) 108 (90 Base) MCG/ACT inhaler INHALE 2 PUFFS INTO THE LUNGS EVERY 6 HOURS AS NEEDED FOR WHEEZING OR SHORTNESS OF BREATH 6.7 g 2   acetaminophen (TYLENOL) 325 MG tablet Take 2 tablets (650 mg total) by mouth every 6 (six) hours as needed for mild pain (or Fever >/= 101).     ALPRAZolam (XANAX) 0.25 MG tablet Take 1 tablet (0.25 mg total) by mouth 2 (two) times daily as needed for anxiety. 60 tablet 0   amphetamine-dextroamphetamine (ADDERALL XR) 30 MG 24 hr capsule Take 30 mg by mouth daily.     amphetamine-dextroamphetamine (ADDERALL) 10 MG tablet Take 10 mg by mouth daily in the afternoon.     Ascorbic Acid (VITAMIN C) 500 MG CAPS Take 500 mg by mouth daily.     Calcipotriene-Betameth Diprop 0.005-0.064 % CREA Apply topically. (Patient not taking: Reported on 10/10/2020)     HYDROcodone-acetaminophen (NORCO) 10-325 MG tablet  Take 1-2 tablets by mouth every 4 (four) hours as needed for moderate pain. (Patient not taking: Reported on 10/10/2020) 60 tablet 0   hydrocortisone cream 1 % Apply 1 application topically 2 (two) times daily. (Patient not taking: Reported on 10/10/2020) 30 g 0   hydrOXYzine (VISTARIL) 25 MG capsule TAKE 1 CAPSULE(25 MG) BY MOUTH AT BEDTIME AS NEEDED FOR ANXIETY OR SLEEP 30 capsule 0   ibuprofen (ADVIL) 200 MG tablet Take 400 mg by mouth every 6 (six) hours as needed for mild pain.     letrozole (FEMARA) 2.5 MG tablet Take 1 tablet (2.5 mg total) by mouth daily. 90 tablet 3   Multiple Vitamin (MULTIVITAMIN WITH MINERALS) TABS tablet Take 1 tablet by mouth daily.     palbociclib (IBRANCE) 125 MG tablet Take 1 tablet (125 mg total) by mouth daily. Take for 21 days on, 7 days off, repeat every 28 days. 21 tablet 2   polyethylene glycol (MIRALAX / GLYCOLAX) 17 g packet Take 17 g by mouth daily. 14 each 0   Tetrahydrozoline HCl (VISINE OP) Place 1 drop into both eyes daily as needed (redness).     venlafaxine XR (EFFEXOR-XR) 150 MG 24 hr capsule TAKE 1 CAPSULE(150 MG) BY MOUTH DAILY WITH BREAKFAST 30 capsule 1   vitamin B-12 (CYANOCOBALAMIN) 1000 MCG tablet Take 1,000 mcg by mouth daily.     No current facility-administered medications for this visit.    VITAL SIGNS:  There were no vitals taken for this visit. There were no vitals filed for this visit.  Estimated body mass index is 28.85 kg/m as calculated from the following:   Height as of 09/10/20: 5\' 4"  (1.626 m).   Weight as of 10/10/20: 76.2 kg (168 lb 1.6 oz).  LABS: CBC:    Component Value Date/Time   WBC 5.5 10/10/2020 1349   HGB 12.3 10/10/2020 1349   HGB 13.1 06/01/2019 1340   HCT 37.1 10/10/2020 1349   PLT 308 10/10/2020 1349   PLT 322 06/01/2019 1340   MCV 91.4 10/10/2020 1349   NEUTROABS 3.2 10/10/2020 1349   LYMPHSABS 1.6 10/10/2020 1349   MONOABS 0.6 10/10/2020 1349   EOSABS 0.1 10/10/2020 1349   BASOSABS 0.1 10/10/2020  1349   Comprehensive Metabolic Panel:    Component Value Date/Time   NA 136 10/10/2020 1349   K 3.5 10/10/2020 1349   CL 97 (L) 10/10/2020 1349   CO2 30 10/10/2020 1349   BUN 13 10/10/2020 1349   CREATININE 0.67 10/10/2020 1349   CREATININE 0.59 06/01/2019 1340   GLUCOSE 137 (H) 10/10/2020 1349   CALCIUM 11.1 (H) 10/10/2020 1349   AST 55 (H) 10/10/2020 1349   AST 21 06/01/2019 1340   ALT 66 (H) 10/10/2020 1349   ALT 26 06/01/2019 1340   ALKPHOS 96 10/10/2020 1349   BILITOT <0.1 (L) 10/10/2020 1349   BILITOT 0.5 06/01/2019 1340   PROT 8.4 (H) 10/10/2020 1349   ALBUMIN 4.2 10/10/2020 1349     Present during today's visit: patient only  Assessment and Plan: CBC/CMP reviewed, continue palbociclib 125mg  21 days on/7 days off. Today is C4D1.  CA 27.29 collected today and results pending   Oral Chemotherapy Side Effect/Intolerance:  Fatigue: overall she feels better at today's visit than she did last visit.Her birthday was yesterday and she felt really good on that day.  Diarrhea: She has started eating high protein foods and has notice a change in her bowel movements. She has about one watery/urgent bowel movement per day and the rest are normal. Reviewed the use of loperamide as needed. No reported nausea  Other: Trouble sleeping: continues, but slightly improved. Take two tylenol PM at night to help with sleep  Oral Chemotherapy Adherence: no missed doses reported No patient barriers to medication adherence identified.   New medications: none reported  Medication Access Issues: no issues, fills at [place]  Patient expressed understanding and was in agreement with this plan. She also understands that She can call clinic at any time with any questions, concerns, or complaints.   Follow-up plan: f/u in 1 month  Thank you for allowing me to participate in the care of this very pleasant patient.   Time Total: 20 mins  Visit consisted of counseling and  education on dealing with issues of symptom management in the setting of serious and potentially life-threatening illness.Greater than 50%  of this time was spent counseling and coordinating care related to the above assessment and plan.  Signed by: Darl Pikes, PharmD, BCPS, Salley Slaughter, CPP Hematology/Oncology Clinical Pharmacist Practitioner [facility] [phone]  11/07/2020 11:44 AM

## 2020-11-07 NOTE — Progress Notes (Signed)
Pt states she is feeling much better since last appointment. Pt states her PET scan was cancelled d/t lack of insurance approval and lack of documentation.

## 2020-11-08 LAB — CANCER ANTIGEN 27.29: CA 27.29: 320.2 U/mL — ABNORMAL HIGH (ref 0.0–38.6)

## 2020-11-08 NOTE — Telephone Encounter (Signed)
Returned a call to Kaylee Romero and discussed her CA 27.29 lab result. She knows to call with any further questions.

## 2020-11-11 ENCOUNTER — Other Ambulatory Visit: Payer: Self-pay | Admitting: Nurse Practitioner

## 2020-11-11 DIAGNOSIS — K644 Residual hemorrhoidal skin tags: Secondary | ICD-10-CM | POA: Insufficient documentation

## 2020-11-11 DIAGNOSIS — C50911 Malignant neoplasm of unspecified site of right female breast: Secondary | ICD-10-CM | POA: Insufficient documentation

## 2020-11-11 DIAGNOSIS — R1032 Left lower quadrant pain: Secondary | ICD-10-CM

## 2020-11-11 MED ORDER — OXYCODONE HCL 5 MG PO TABS: 5.0000 mg | ORAL_TABLET | Freq: Four times a day (QID) | ORAL | 0 refills | Status: DC | PRN

## 2020-11-11 NOTE — Progress Notes (Signed)
Received call from patient with complaints of left lower quadrant abdominal pain x 3 hours. She has had a bowel movement without change in pain. Has tried milk of magnesia, tums, and gas-x without improvement in symptoms as well as tylenol and aleve. Pain worsened by sitting up straight, improves with laying down. She declines ER evaluation and requests management of pain and outpatient workup. Will send oxycodone 5 mg q6h prn prescription and plan to see her for acute visit in clinic in morning. If pain persists despite pain medication, she agrees to evaluation in ER.

## 2020-11-12 ENCOUNTER — Ambulatory Visit: Payer: BC Managed Care – PPO | Admitting: Oncology

## 2020-11-12 ENCOUNTER — Other Ambulatory Visit: Payer: BC Managed Care – PPO

## 2020-11-12 ENCOUNTER — Other Ambulatory Visit: Payer: Self-pay

## 2020-11-12 ENCOUNTER — Inpatient Hospital Stay: Payer: BC Managed Care – PPO

## 2020-11-12 ENCOUNTER — Encounter: Payer: Self-pay | Admitting: Nurse Practitioner

## 2020-11-12 ENCOUNTER — Ambulatory Visit
Admission: RE | Admit: 2020-11-12 | Discharge: 2020-11-12 | Disposition: A | Discharge status: Home / Self Care | Payer: BC Managed Care – PPO | Source: Ambulatory Visit | Attending: Nurse Practitioner | Admitting: Nurse Practitioner

## 2020-11-12 ENCOUNTER — Inpatient Hospital Stay (HOSPITAL_BASED_OUTPATIENT_CLINIC_OR_DEPARTMENT_OTHER): Payer: BC Managed Care – PPO | Admitting: Nurse Practitioner

## 2020-11-12 VITALS — BP 119/77 | HR 97 | Temp 97.7°F | Resp 17 | Wt 166.0 lb

## 2020-11-12 DIAGNOSIS — C50911 Malignant neoplasm of unspecified site of right female breast: Secondary | ICD-10-CM | POA: Insufficient documentation

## 2020-11-12 DIAGNOSIS — R300 Dysuria: Secondary | ICD-10-CM

## 2020-11-12 DIAGNOSIS — C50411 Malignant neoplasm of upper-outer quadrant of right female breast: Secondary | ICD-10-CM

## 2020-11-12 DIAGNOSIS — R1032 Left lower quadrant pain: Secondary | ICD-10-CM | POA: Insufficient documentation

## 2020-11-12 DIAGNOSIS — K644 Residual hemorrhoidal skin tags: Secondary | ICD-10-CM | POA: Diagnosis not present

## 2020-11-12 DIAGNOSIS — Z170 Estrogen receptor positive status [ER+]: Secondary | ICD-10-CM

## 2020-11-12 LAB — CBC WITH DIFFERENTIAL/PLATELET
Abs Immature Granulocytes: 0.01 10*3/uL (ref 0.00–0.07)
Basophils Absolute: 0.1 10*3/uL (ref 0.0–0.1)
Basophils Relative: 2 %
Eosinophils Absolute: 0.1 10*3/uL (ref 0.0–0.5)
Eosinophils Relative: 2 %
HCT: 35.3 % — ABNORMAL LOW (ref 36.0–46.0)
Hemoglobin: 12 g/dL (ref 12.0–15.0)
Immature Granulocytes: 0 %
Lymphocytes Relative: 25 %
Lymphs Abs: 1 10*3/uL (ref 0.7–4.0)
MCH: 32.4 pg (ref 26.0–34.0)
MCHC: 34 g/dL (ref 30.0–36.0)
MCV: 95.4 fL (ref 80.0–100.0)
Monocytes Absolute: 0.4 10*3/uL (ref 0.1–1.0)
Monocytes Relative: 9 %
Neutro Abs: 2.5 10*3/uL (ref 1.7–7.7)
Neutrophils Relative %: 62 %
Platelets: 340 10*3/uL (ref 150–400)
RBC: 3.7 MIL/uL — ABNORMAL LOW (ref 3.87–5.11)
RDW: 16.6 % — ABNORMAL HIGH (ref 11.5–15.5)
WBC: 4 10*3/uL (ref 4.0–10.5)
nRBC: 0 % (ref 0.0–0.2)

## 2020-11-12 LAB — URINALYSIS, COMPLETE (UACMP) WITH MICROSCOPIC
Bilirubin Urine: NEGATIVE
Glucose, UA: NEGATIVE mg/dL
Hgb urine dipstick: NEGATIVE
Ketones, ur: NEGATIVE mg/dL
Nitrite: NEGATIVE
Protein, ur: NEGATIVE mg/dL
Specific Gravity, Urine: 1.008 (ref 1.005–1.030)
pH: 6 (ref 5.0–8.0)

## 2020-11-12 LAB — COMPREHENSIVE METABOLIC PANEL
ALT: 41 U/L (ref 0–44)
AST: 29 U/L (ref 15–41)
Albumin: 3.8 g/dL (ref 3.5–5.0)
Alkaline Phosphatase: 70 U/L (ref 38–126)
Anion gap: 7 (ref 5–15)
BUN: 13 mg/dL (ref 6–20)
CO2: 29 mmol/L (ref 22–32)
Calcium: 9.4 mg/dL (ref 8.9–10.3)
Chloride: 101 mmol/L (ref 98–111)
Creatinine, Ser: 0.76 mg/dL (ref 0.44–1.00)
GFR, Estimated: >60 mL/min (ref >60)
Glucose, Bld: 116 mg/dL — ABNORMAL HIGH (ref 70–99)
Potassium: 3.8 mmol/L (ref 3.5–5.1)
Sodium: 137 mmol/L (ref 135–145)
Total Bilirubin: 0.5 mg/dL (ref 0.3–1.2)
Total Protein: 6.9 g/dL (ref 6.5–8.1)

## 2020-11-12 IMAGING — CT CT CHEST-ABD-PELV W/ CM
2 of 5 series · 12 of 36 positions shown, 14 images · IV contrast (omnipaque)
Comparison: [DATE] CT chest, abdomen and pelvis.

CLINICAL DATA: Recurrent breast cancer with malignant left pleural
effusion, with ongoing oral chemotherapy. Restaging. Patient reports
left lower quadrant abdominal pain since last night.

EXAM:
CT CHEST, ABDOMEN, AND PELVIS WITH CONTRAST
TECHNIQUE: Multidetector CT imaging of the chest, abdomen and pelvis was
performed following the standard protocol during bolus
administration of intravenous contrast.
CONTRAST:  100mL OMNIPAQUE IOHEXOL 300 MG/ML  SOLN

[Series 2: cap with · axial · 0.78mm/px · z∈[-582,-98]mm · 9 of 123 slices shown, 11 images]
[im 13/123  mediastinal]
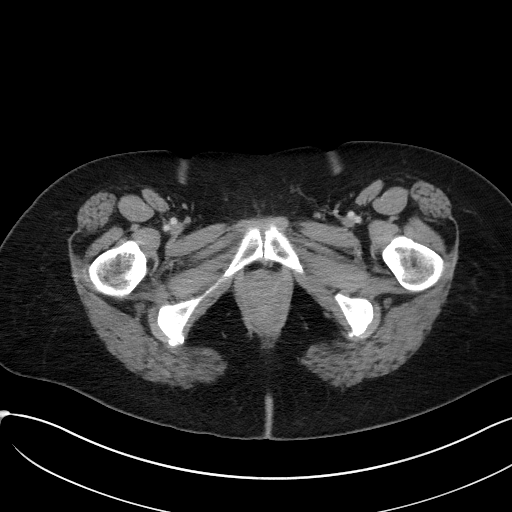
[im 13/123  bone]
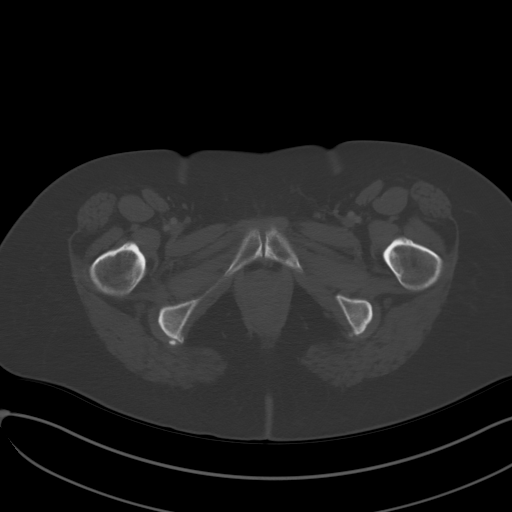
[im 25/123  mediastinal]
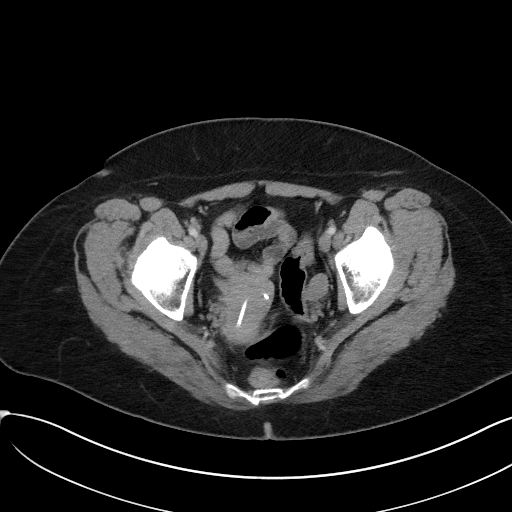
[im 37/123  mediastinal]
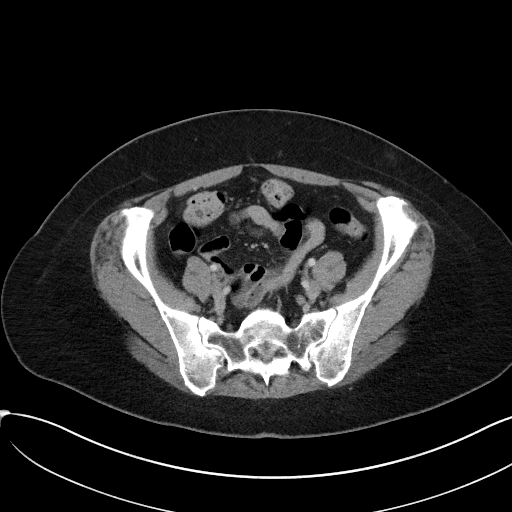
[im 49/123  mediastinal]
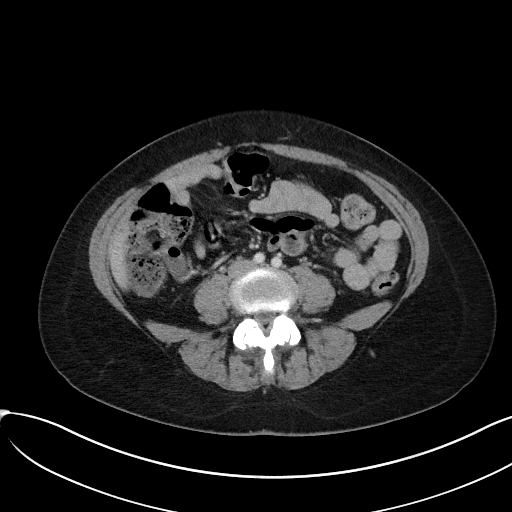
[im 62/123  mediastinal]
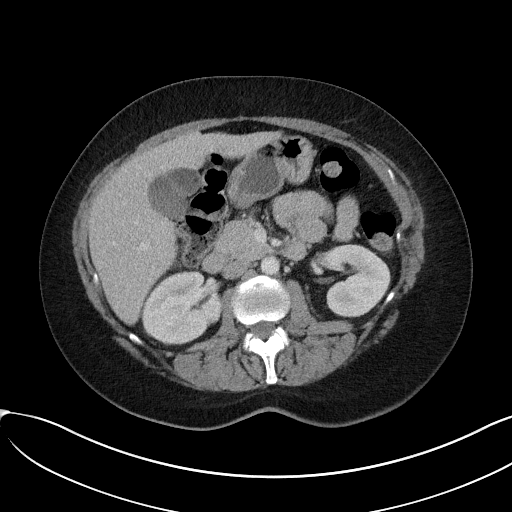
[im 74/123  mediastinal]
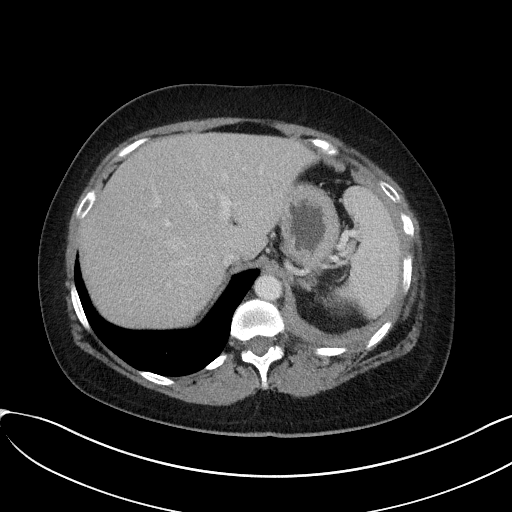
[im 86/123  mediastinal]
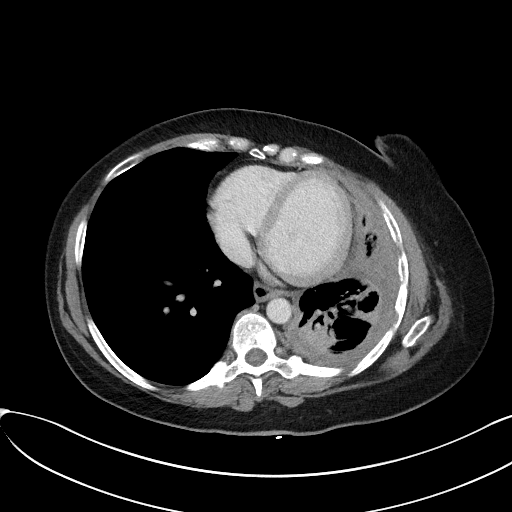
[im 98/123  mediastinal]
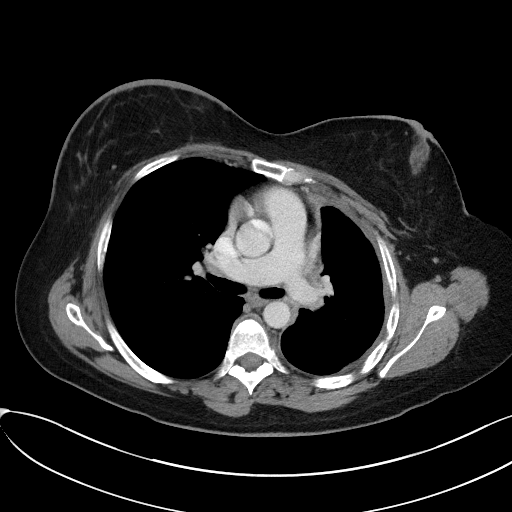
[im 110/123  mediastinal]
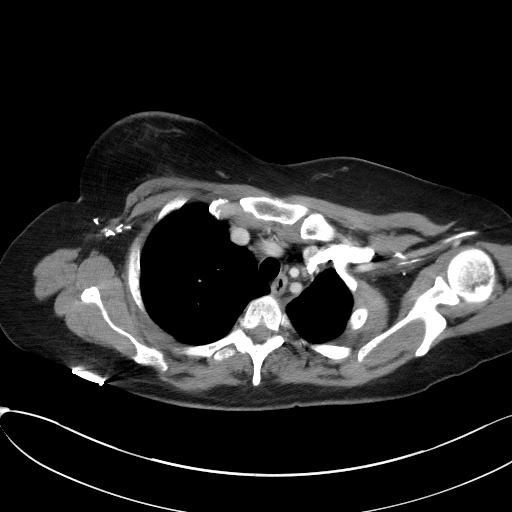
[im 110/123  bone]
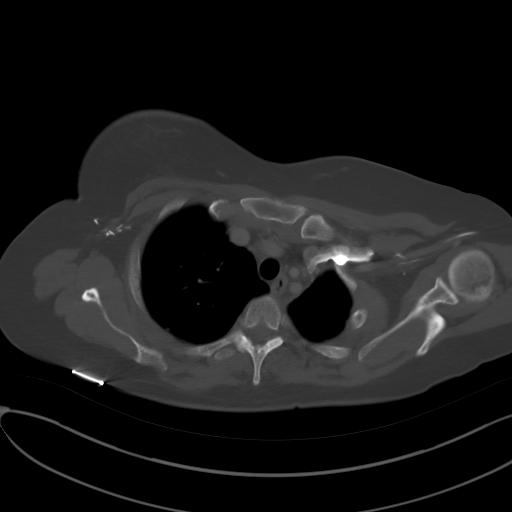

[Series 5: coronals · coronal · 0.90mm/px · 3 of 143 slices shown]
[im 29/143  mediastinal]
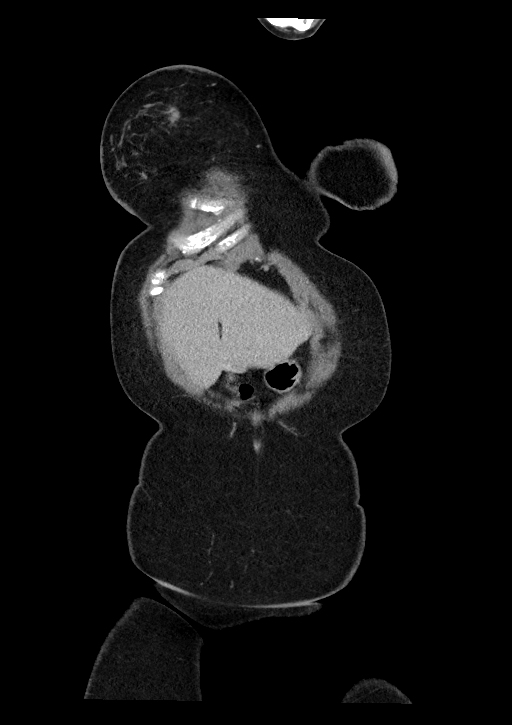
[im 57/143  mediastinal]
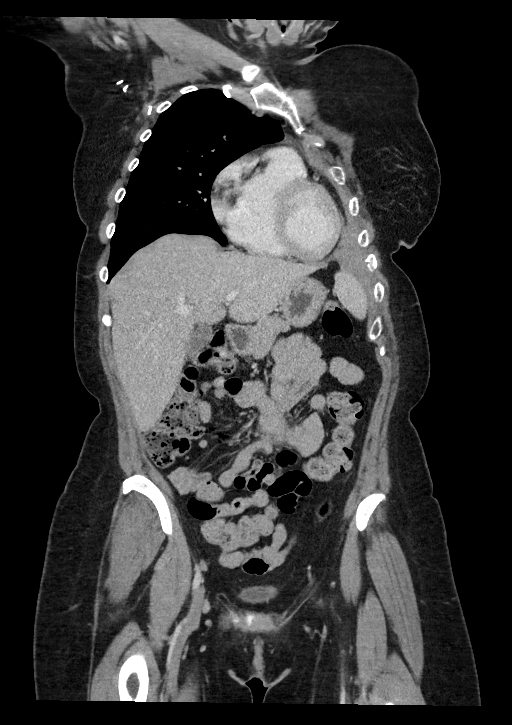
[im 86/143  mediastinal]
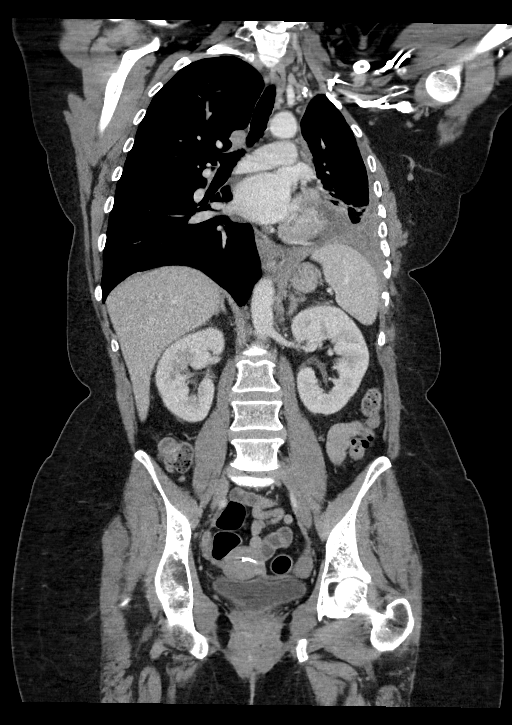

[12 of 36 positions shown; findings below may reference images not displayed]

FINDINGS: CT CHEST FINDINGS

Cardiovascular: Normal heart size. No significant pericardial
effusion/thickening. Great vessels are normal in course and caliber.
No central pulmonary emboli.

Mediastinum/Nodes: No discrete thyroid nodules. Unremarkable
esophagus. Surgical clips again noted throughout the right axilla.
No pathologically enlarged axillary nodes. Previously noted mildly
prominent rounded left supraclavicular and high left mediastinal
lymph nodes have slightly decreased, for example measuring 0.6 cm
(series 2/image 12), decreased from 0.8 cm. No pathologically
enlarged mediastinal or hilar nodes.

Lungs/Pleura: No pneumothorax. No right pleural effusion. Diffuse
irregular left pleural thickening and enhancement, similar. Small
left pleural effusion is decreased. Stable prominent volume loss in
the left hemithorax. Previously visualized interlobular septal
thickening and extensive patchy consolidation and ground-glass
opacity in the left lung has significantly improved, with residual
thick bandlike consolidation in the lingula and left lower lobe.
Several (at least 8) solid pulmonary nodules scattered throughout
the right greater than left lungs, several of which are new or
increased and two of which are stable. For example, a 0.6 cm
peripheral right lower lobe solid nodule (series 4/image 102) is
increased from 0.2 cm. Posterior right upper lobe 0.3 cm (series
4/image 34) and posterior basilar right lower lobe 0.3 cm (series
4/image 123) solid nodules are new. Anteromedial left upper lobe
cm nodule (series 4/image 50) is new. Posterior right lower lobe
cm nodule (series 4/image 89), previously 0.8 cm, not substantially
changed.

Musculoskeletal: New round 1.2 cm T9 vertebral lesion with
peripheral sclerosis (series 6/image 92). Numerous scalloping lytic
lesions throughout the lateral left sixth through tenth ribs (series
6/images 147-151), largely new. New pathologic anterior left seventh
rib fracture.

CT ABDOMEN PELVIS FINDINGS

Hepatobiliary: New 1.0 cm hypodense peripheral right liver lesion
(series 2/image 52). New indistinct 0.7 cm hypodense anterior left
liver lesion (series 2/image 49). No additional liver lesions.
Normal gallbladder with no radiopaque cholelithiasis. No biliary
ductal dilatation.

Pancreas: Normal, with no mass or duct dilation.

Spleen: Normal size. No mass.

Adrenals/Urinary Tract: Normal adrenals. Normal kidneys with no
hydronephrosis and no renal mass. Normal bladder.

Stomach/Bowel: Small hiatal hernia. Otherwise normal nondistended
stomach. Normal caliber small bowel with no small bowel wall
thickening. Normal appendix. Normal large bowel with no
diverticulosis, large bowel wall thickening or pericolonic fat
stranding.

Vascular/Lymphatic: Normal caliber abdominal aorta. Patent portal,
splenic, hepatic and renal veins. No pathologically enlarged lymph
nodes in the abdomen or pelvis.

Reproductive: Intrauterine device appears grossly well-positioned in
the uterine cavity. Grossly normal anteverted uterus. No adnexal
mass.

Other: No pneumoperitoneum, ascites or focal fluid collection.

Musculoskeletal: No aggressive appearing focal osseous lesions.
Marked degenerative disc disease at L5-S1.
IMPRESSION: 1. Mixed interval response, although overall compatible with
progression of metastatic disease.
2. Two new low-attenuation liver lesions, suspicious for new liver
metastases.
3. New sclerotic T9 vertebral metastasis. Numerous scalloping lytic
lesions throughout the lateral left sixth through tenth ribs,
largely new, compatible with osseous metastases. New pathologic
anterior left seventh rib fracture.
4. Several new or enlarging small pulmonary nodules scattered
throughout the right greater than left lungs, suspicious for
progression of pulmonary metastases.
5. Diffuse irregular left pleural thickening and enhancement,
similar. Small left pleural effusion is decreased.
6. Previously visualized interlobular septal thickening and
extensive patchy consolidation and ground-glass opacity in the left
lung has significantly improved, with residual thick bandlike
consolidation in the lingula and left lower lobe.
7. Previously noted mildly prominent rounded left supraclavicular
and high left mediastinal lymph nodes have slightly decreased.
8. Small hiatal hernia.

## 2020-11-12 MED ORDER — MORPHINE SULFATE 2 MG/ML IJ SOLN
4.0000 mg | Freq: Once | INTRAMUSCULAR | Status: AC
  Administered 2020-11-12: 4 mg via INTRAVENOUS
  Filled 2020-11-12: qty 2

## 2020-11-12 MED ORDER — SODIUM CHLORIDE 0.9 % IV SOLN
INTRAVENOUS | Status: DC
  Filled 2020-11-12 (×2): qty 250

## 2020-11-12 MED ORDER — IOHEXOL 300 MG/ML  SOLN
100.0000 mL | Freq: Once | INTRAMUSCULAR | Status: AC | PRN
  Administered 2020-11-12: 100 mL via INTRAVENOUS

## 2020-11-12 MED ORDER — SODIUM CHLORIDE 0.9 % IV SOLN
Freq: Once | INTRAVENOUS | Status: AC
  Filled 2020-11-12: qty 250

## 2020-11-12 MED ORDER — MORPHINE SULFATE (PF) 2 MG/ML IV SOLN
4.0000 mg | Freq: Once | INTRAVENOUS | Status: AC
  Administered 2020-11-12: 4 mg via INTRAVENOUS
  Filled 2020-11-12: qty 2

## 2020-11-12 NOTE — Progress Notes (Signed)
Patient in [facility] received morphine 4mg  for abdominal pain 6/10. Patient still c/o of abdominal pain and anxiety. Patient transported to CT scan will return to [place] to finished fluids.

## 2020-11-12 NOTE — Progress Notes (Signed)
Patient received 1L NS in [facility] today with an additional 4mg  morphine(see previous note) pain down to 4/10. Patient still has some discomfort, has oxycodone rx at home. No other concerns voiced. Around 1315 patient discharged. Informed NP will call regarding lab, urine and CT scan results.

## 2020-11-12 NOTE — Progress Notes (Signed)
Patient here for [facility] states she starting having sharp pain last night ranking 10/10, denies urination issues and slight constipation

## 2020-11-12 NOTE — Patient Instructions (Signed)
Morphine Injection What is this medication? MORPHINE (MOR feen) treats severe pain. It is prescribed when other pain medications have not worked or cannot be tolerated. It works by blocking pain signals in the brain. It belongs to a group of medications called opioids. This medicine may be used for other purposes; ask your health care provider or pharmacist if you have questions. COMMON BRAND NAME(S): Astramorph PF, Duramorph, Duramorph PF, Infumorph, MITIGO What should I tell my care team before I take this medication? They need to know if you have any of these conditions: Bleeding disorder Brain tumor Drug abuse or addiction Head injury Heart disease If you often drink alcohol Low adrenal gland function Lung disease, asthma, or breathing problem Seizures Stomach or intestine problems Take medications that treat or prevent blood clots Taken an MAOI such as Marplan, Nardil, or Parnate in the last 14 days Trouble passing urine An unusual or allergic reaction to morphine, other medications, foods, dyes, or preservatives Pregnant or trying to get pregnant Breast-feeding How should I use this medication? This medication is for injection into a muscle, vein, or under the skin. It is usually given in a hospital or clinic setting. If you get this medication at home, you will be taught how to prepare and give this medication. Use exactly as directed. Take your medication at regular intervals. Do not take your medication more often than directed. Always look at your medication before using it. Do not use the injection if its color is darker than pale yellow or if it is discolored in any other way. Do not use this medication if it is cloudy, thickened, colored, or has solid particles in it. It is important that you put your used needles and syringes in a special sharps container. Do not put them in a trash can. If you do not have a sharps container, call your pharmacist or care team to get  one. Talk to your care team regarding the use of this medication in children. Special care may be needed. Overdosage: If you think you have taken too much of this medicine contact a poison control center or emergency room at once. NOTE: This medicine is only for you. Do not share this medicine with others. What if I miss a dose? If you miss a dose, take it as soon as you can. If it is almost time for your next dose, take only that dose. Do not take double or extra doses. What may interact with this medication? Do not take this medication with any of the following: Linezolid MAOIs like Marplan, Nardil, and Parnate Methylene blue Samidorphan This medication may interact with the following: Alcohol Antihistamines for allergy, cough, and cold Atropine Certain medications for anxiety or sleep Certain medications for bladder problems like oxybutynin, tolterodine Certain medications for depression like amitriptyline, fluoxetine, sertraline, mirtazapine, trazodone Certain medications for migraine headache like almotriptan, eletriptan, frovatriptan, naratriptan, rizatriptan, sumatriptan, zolmitriptan Certain medications for nausea or vomiting like dolasetron, granisetron, ondansetron, palonosetron Certain medications for Parkinson's disease like benztropine, trihexyphenidyl Certain medications for seizures like phenobarbital, primidone Certain medications for stomach problems like dicyclomine, hyoscyamine Certain medications for travel sickness like scopolamine Clopidogrel Diuretics General anesthetics like halothane, isoflurane, methoxyflurane, propofol Ipratropium Medications that relax muscles Other narcotic medications for pain or cough Phenothiazines like chlorpromazine, mesoridazine, prochlorperazine, thioridazine Prasugrel Ticagrelor This list may not describe all possible interactions. Give your health care provider a list of all the medicines, herbs, non-prescription drugs, or  dietary supplements you use. Also tell them if   you smoke, drink alcohol, or use illegal drugs. Some items may interact with your medicine. What should I watch for while using this medication? Tell your care team if your pain does not go away, if it gets worse, or if you have new or a different type of pain. You may develop tolerance to this medication. Tolerance means that you will need a higher dose of the medication for pain relief. Tolerance is normal and is expected if you take this medication for a long time. There are different types of narcotic medications (opioids) for pain. If you take more than one type at the same time, you may have more side effects. Give your care team a list of all medications you use. He or she will tell you how much medication to take. Do not take more medication than directed. Get emergency help right away if you have problems breathing. Do not suddenly stop taking your medication because you may develop a severe reaction. Your body becomes used to the medication. This does NOT mean you are addicted. Addiction is a behavior related to getting and using a medication for a nonmedical reason. If you have pain, you have a medical reason to take pain medication. Your care team will tell you how much medication to take. If your care team wants you to stop the medication, the dose will be slowly lowered over time to avoid any side effects. Talk to your care team about naloxone and how to get it. Naloxone is an emergency medication used for an opioid overdose. An overdose can happen if you take too much opioid. It can also happen if an opioid is taken with some other medications or substances, like alcohol. Know the symptoms of an overdose, like trouble breathing, unusually tired or sleepy, or not being able to respond or wake up. Make sure to tell caregivers and close contacts where it is stored. Make sure they know how to use it. After naloxone is given, you must get emergency help  right away. Naloxone is a temporary treatment. Repeat doses may be needed. You may get drowsy or dizzy. Do not drive, use machinery, or do anything that needs mental alertness until you know how this medication affects you. Do not stand up or sit up quickly, especially if you are an older patient. This reduces the risk of dizzy or fainting spells. Alcohol may interfere with the effect of this medication. Avoid alcoholic drinks. This medication will cause constipation. If you do not have a bowel movement for 3 days, call your care team. Your mouth may get dry. Chewing sugarless gum or sucking hard candy and drinking plenty of water may help. Contact your care team if the problem does not go away or is severe. What side effects may I notice from receiving this medication? Side effects that you should report to your care team as soon as possible: Allergic reactions--skin rash, itching, hives, swelling of the face, lips, tongue, or throat CNS depression--slow or shallow breathing, shortness of breath, feeling faint, dizziness, confusion, difficulty staying awake Low adrenal gland function--nausea, vomiting, loss of appetite, unusual weakness or fatigue, dizziness Low blood pressure--dizziness, feeling faint or lightheaded, blurry vision Side effects that usually do not require medical attention (report to your care team if they continue or are bothersome): Constipation Dizziness Drowsiness Dry mouth Headache Nausea Vomiting This list may not describe all possible side effects. Call your doctor for medical advice about side effects. You may report side effects to FDA at 1-800-FDA-1088.  Where should I keep my medication? Keep out of the reach of children and pets. This medication can be abused. Keep it in a safe place to protect it from theft. Do not share this medication with anyone. Selling or giving away this medication is dangerous and is against the law. If you are using this medication at home,  you will be instructed on how to store this medication. Throw away any unused medication after the expiration date on the label. Discard unused medication and used packaging carefully. Children and pets can be harmed if they find used or lost packages. NOTE: This sheet is a summary. It may not cover all possible information. If you have questions about this medicine, talk to your doctor, pharmacist, or health care provider.  2022 [employer] (2020-02-02 00:00:00)

## 2020-11-12 NOTE — Progress Notes (Signed)
Symptom Management [place]  [phone]) [phone] [phone]  Patient Care Team: Dion Body, MD as PCP - General (Family Medicine) Gery Pray, MD as Consulting Physician (Radiation Oncology) Jovita Kussmaul, MD as Consulting Physician (General Surgery) Dillingham, Loel Lofty, DO as Attending Physician (Plastic Surgery) Lloyd Huger, MD as Consulting Physician (Oncology)   Name of the patient: Kaylee Romero  [id number]  [birth year]   Date of visit: 11/12/20  Diagnosis- Metastatic Breast Cancer  Chief complaint/ Reason for visit- Abdominal Pain  Heme/Onc history:  Oncology History  Malignant neoplasm of upper-outer quadrant of right breast in female, estrogen receptor positive ([place])  09/29/2018 Initial Diagnosis   Malignant neoplasm of upper-outer quadrant of right breast in female, estrogen receptor positive ([place])   09/2018 -  Anti-estrogen oral therapy   Tamoxifen; held from 12/05/2018-09/06/2019 for surgery   10/05/2018 Cancer Staging   Staging form: Breast, AJCC 8th Edition - Clinical stage from 10/05/2018: Stage IB (cT2, cN0, cM0, G2, ER+, PR+, HER2-) - Signed by Chauncey Cruel, MD on 04/02/2020 Stage prefix: Initial diagnosis Histologic grading system: 3 grade system    10/13/2018 Genetic Testing   Negative genetic testing. No pathogenic variants identified on the [facility] Breast Cancer STAT Panel + Common Hereditary Cancers Panel. The Common Hereditary Cancers Panel offered by [facility] includes sequencing and/or deletion duplication testing of the following 47 genes: APC, ATM, AXIN2, BARD1, BMPR1A, BRCA1, BRCA2, BRIP1, CDH1, CDKN2A (p14ARF), CDKN2A (p16INK4a), CKD4, CHEK2, CTNNA1, DICER1, EPCAM (Deletion/duplication testing only), GREM1 (promoter region deletion/duplication testing only), KIT, MEN1, MLH1, MSH2, MSH3, MSH6, MUTYH, NBN, NF1, NHTL1, PALB2, PDGFRA, PMS2, POLD1, POLE, PTEN, RAD50, RAD51C, RAD51D, SDHB, SDHC,  SDHD, SMAD4, SMARCA4. STK11, TP53, TSC1, TSC2, and VHL.  The following genes were evaluated for sequence changes only: SDHA and HOXB13 c.251G>A variant only. The report date is 10/13/2018.    11/21/2018 Surgery   Right lumpectomy [name]) [phone]): IDC, grade 2, 2.6 cm, with DCIS. Negative margins. 1/4 lymph nodes positive for macrometastasis. ER/PR positive, HER-2 negative.   12/26/2018 - 06/01/2019 Chemotherapy   dexamethasone (DECADRON) 4 MG tablet, 1 of 1 cycle, Start date: 12/05/2018, End date: 03/02/2019  DOXOrubicin (ADRIAMYCIN) chemo injection 114 mg, 60 mg/m2 = 114 mg, Intravenous,  Once, 4 of 4 cycles. Administration: 114 mg (01/05/2019), 114 mg (01/19/2019), 114 mg (02/02/2019), 114 mg (02/15/2019)  palonosetron (ALOXI) injection 0.25 mg, 0.25 mg, Intravenous,  Once, 1 of 1 cycle. Administration: 0.25 mg (01/05/2019)  pegfilgrastim (NEULASTA ONPRO KIT) injection 6 mg, 6 mg, Subcutaneous, Once, 2 of 2 cycles  pegfilgrastim-jmdb (FULPHILA) injection 6 mg, 6 mg, Subcutaneous,  Once, 4 of 4 cycles. Administration: 6 mg (01/07/2019), 6 mg (01/21/2019), 6 mg (02/06/2019), 6 mg (02/17/2019)  cyclophosphamide (CYTOXAN) 1,140 mg in sodium chloride 0.9 % 250 mL chemo infusion, 600 mg/m2 = 1,140 mg, Intravenous,  Once, 4 of 4 cycles. Administration: 1,140 mg (01/05/2019), 1,140 mg (01/19/2019), 1,140 mg (02/02/2019), 1,140 mg (02/15/2019).  PACLitaxel (TAXOL) 150 mg in sodium chloride 0.9 % 250 mL chemo infusion (</= $RemoveBefor'80mg'KOKjrrqtAUfa$ /m2), 80 mg/m2 = 150 mg, Intravenous,  Once, 12 of 12 cycles. Administration: 150 mg (03/02/2019), 150 mg (03/09/2019), 150 mg (03/23/2019), 150 mg (03/30/2019), 150 mg (04/06/2019), 150 mg (04/20/2019), 150 mg (04/27/2019), 150 mg (05/03/2019), 150 mg (05/11/2019), 150 mg (05/18/2019), 150 mg (05/25/2019), 150 mg (06/01/2019)  fosaprepitant (EMEND) 150 mg  dexamethasone (DECADRON) 12 mg in sodium chloride 0.9 % 145 mL IVPB, , Intravenous,  Once, 4 of 4 cycles. Administration:  (01/05/2019),   (  01/19/2019),  (02/02/2019),  (02/15/2019).   06/21/2019 - 08/11/2019 Radiation Therapy   The patient initially received a dose of 50.4 Gy in 28 fractions to the breast using whole-breast tangent fields. This was delivered using a 3-D conformal technique. The pt received a boost delivering an additional 16 Gy in 8 fractions using a electron boost with 52meV electrons. The total dose was 66.4 Gy.    11/12/2019 Cancer Staging   Staging form: Breast, AJCC 8th Edition - Pathologic: Stage IB (pT2, pN1a, cM0, G2, ER+, PR+, HER2-)     Oncotype testing   MammaPrint high risk     Interval history-patient is 40 year old female diagnosed with recurrent metastatic breast cancer currently undergoing treatment with Ibrance, letrozole, and lupron for ovarian suppression.  She presents to symptom management clinic for complaints of left lower quadrant abdominal pain which started last night.  Described as severe.  Worse when sitting upright.  No improvement with bowel movement.  Tried OTC simethicone, milk of magnesia, and PPI.  She was referred to ER but declined.  Was called and oxycodone 5 mg every 6 hours which improved her pain. She continues to have abdominal pain in clinic 6/10. Was previously 10/10. No nausea, vomiting, constipation, or diarrhea. Has a history of hemorrhoids post pregnancy. No dizziness or falls. No fevers, chills, or illness. No easy bleeding or bruising. Appetite is good. Weight is stable. Denies chest pain or shortness of breath. Denies urinary complaints.   Review of systems- Review of Systems  Constitutional:  Positive for malaise/fatigue. Negative for chills, fever and weight loss.  HENT:  Negative for hearing loss, nosebleeds, sore throat and tinnitus.   Eyes:  Negative for blurred vision and double vision.  Respiratory:  Negative for cough, hemoptysis, shortness of breath and wheezing.   Cardiovascular:  Negative for chest pain, palpitations and leg swelling.  Gastrointestinal:   Positive for abdominal pain. Negative for blood in stool, constipation, diarrhea, melena, nausea and vomiting.  Genitourinary:  Negative for dysuria and urgency.  Musculoskeletal:  Negative for back pain, falls, joint pain and myalgias.  Skin:  Negative for itching and rash.  Neurological:  Negative for dizziness, tingling, sensory change, loss of consciousness, weakness and headaches.  Endo/Heme/Allergies:  Negative for environmental allergies. Does not bruise/bleed easily.  Psychiatric/Behavioral:  Negative for depression. The patient has insomnia. The patient is not nervous/anxious.     Allergies  Allergen Reactions   Betadine [Povidone-Iodine]     Past Medical History:  Diagnosis Date   Anxiety    Family history of breast cancer    HSV (herpes simplex virus) anogenital infection    positive titer only    Past Surgical History:  Procedure Laterality Date   BREAST LUMPECTOMY WITH AXILLARY LYMPH NODE BIOPSY Right 11/21/2018   Procedure: RIGHT BREAST LUMPECTOMY WITH SENTINEL LYMPH NODE BIOPSY;  Surgeon: Jovita Kussmaul, MD;  Location: [facility];  Service: General;  Laterality: Right;   BREAST REDUCTION SURGERY Bilateral 11/21/2018   Procedure: BILATERAL MAMMARY REDUCTION  (BREAST);  Surgeon: Wallace Going, DO;  Location: [facility];  Service: Plastics;  Laterality: Bilateral;   CHEST TUBE INSERTION Left 07/29/2020   Procedure: INSERTION PLEURAL DRAINAGE CATHETER;  Surgeon: Lajuana Matte, MD;  Location: [facility];  Service: Thoracic;  Laterality: Left;   IRRIGATION AND DEBRIDEMENT STERNOCLAVICULAR JOINT-STERNUM AND RIBS N/A 08/13/2020   Procedure: IRRIGATION AND DEBRIDEMENT CHEST WALL ABSCESS;  Surgeon: Lajuana Matte, MD;  Location: [facility];  Service: Cardiothoracic;  Laterality: N/A;  PLEURAL BIOPSY Left 07/29/2020   Procedure: PLEURAL BIOPSY;  Surgeon: Lajuana Matte, MD;  Location: [facility];  Service: Thoracic;  Laterality: Left;   PLEURAL EFFUSION DRAINAGE Left 07/29/2020    Procedure: DRAINAGE OF PLEURAL EFFUSION;  Surgeon: Lajuana Matte, MD;  Location: [facility];  Service: Thoracic;  Laterality: Left;   PORT-A-CATH REMOVAL N/A 07/06/2019   Procedure: REMOVAL PORT-A-CATH;  Surgeon: Jovita Kussmaul, MD;  Location: [facility];  Service: General;  Laterality: N/A;   PORTACATH PLACEMENT N/A 11/21/2018   Procedure: INSERTION LEFT PORT-A-CATH WITH ULTRASOUND GUIDANCE;  Surgeon: Jovita Kussmaul, MD;  Location: [facility];  Service: General;  Laterality: N/A;   TONSILLECTOMY  1987   TONSILLECTOMY     VIDEO ASSISTED THORACOSCOPY Left 07/29/2020   Procedure: VIDEO ASSISTED THORACOSCOPY;  Surgeon: Lajuana Matte, MD;  Location: [facility];  Service: Thoracic;  Laterality: Left;   WISDOM TOOTH EXTRACTION      Social History   Socioeconomic History   Marital status: Married    Spouse name: Not on file   Number of children: Not on file   Years of education: Not on file   Highest education level: Not on file  Occupational History   Not on file  Tobacco Use   Smoking status: Former    Packs/day: 0.50    Years: 20.00    Pack years: 10.00    Types: Cigarettes    Start date: 01/13/2002    Quit date: 10/17/2016    Years since quitting: 4.0   Smokeless tobacco: Never  Vaping Use   Vaping Use: Never used  Substance and Sexual Activity   Alcohol use: Yes    Comment: Socially   Drug use: No   Sexual activity: Not on file  Other Topics Concern   Not on file  Social History Narrative   Not on file   Social Determinants of Health   Financial Resource Strain: Not on file  Food Insecurity: Not on file  Transportation Needs: Not on file  Physical Activity: Not on file  Stress: Not on file  Social Connections: Not on file  Intimate Partner Violence: Not on file    Family History  Problem Relation Age of Onset   Hypertension Father    Alcohol abuse Paternal Grandfather    Heart disease Paternal Grandfather    Alcohol abuse Maternal Grandfather     Heart disease Maternal Grandmother    Breast cancer Other     Current Outpatient Medications:    acetaminophen (TYLENOL) 325 MG tablet, Take 2 tablets (650 mg total) by mouth every 6 (six) hours as needed for mild pain (or Fever >/= 101)., Disp: , Rfl:    albuterol (VENTOLIN HFA) 108 (90 Base) MCG/ACT inhaler, INHALE 2 PUFFS INTO THE LUNGS EVERY 6 HOURS AS NEEDED FOR WHEEZING OR SHORTNESS OF BREATH, Disp: 6.7 g, Rfl: 2   ALPRAZolam (XANAX) 0.25 MG tablet, Take 1 tablet (0.25 mg total) by mouth 2 (two) times daily as needed for anxiety., Disp: 60 tablet, Rfl: 0   amphetamine-dextroamphetamine (ADDERALL XR) 30 MG 24 hr capsule, Take 30 mg by mouth daily., Disp: , Rfl:    amphetamine-dextroamphetamine (ADDERALL) 10 MG tablet, Take 10 mg by mouth daily in the afternoon., Disp: , Rfl:    Ascorbic Acid (VITAMIN C) 500 MG CAPS, Take 500 mg by mouth daily., Disp: , Rfl:    Calcipotriene-Betameth Diprop 0.005-0.064 % CREA, Apply topically. (Patient not taking: No sig reported), Disp: , Rfl:  hydrocortisone cream 1 %, Apply 1 application topically 2 (two) times daily. (Patient not taking: No sig reported), Disp: 30 g, Rfl: 0   hydrOXYzine (VISTARIL) 25 MG capsule, TAKE 1 CAPSULE(25 MG) BY MOUTH AT BEDTIME AS NEEDED FOR ANXIETY OR SLEEP, Disp: 30 capsule, Rfl: 0   ibuprofen (ADVIL) 200 MG tablet, Take 400 mg by mouth every 6 (six) hours as needed for mild pain., Disp: , Rfl:    letrozole (FEMARA) 2.5 MG tablet, Take 1 tablet (2.5 mg total) by mouth daily., Disp: 90 tablet, Rfl: 3   Multiple Vitamin (MULTIVITAMIN WITH MINERALS) TABS tablet, Take 1 tablet by mouth daily., Disp: , Rfl:    oxyCODONE (OXY IR/ROXICODONE) 5 MG immediate release tablet, Take 1 tablet (5 mg total) by mouth every 6 (six) hours as needed for severe pain., Disp: 30 tablet, Rfl: 0   palbociclib (IBRANCE) 125 MG tablet, Take 1 tablet (125 mg total) by mouth daily. Take for 21 days on, 7 days off, repeat every 28 days., Disp: 21 tablet,  Rfl: 2   polyethylene glycol (MIRALAX / GLYCOLAX) 17 g packet, Take 17 g by mouth daily., Disp: 14 each, Rfl: 0   Tetrahydrozoline HCl (VISINE OP), Place 1 drop into both eyes daily as needed (redness)., Disp: , Rfl:    venlafaxine XR (EFFEXOR-XR) 150 MG 24 hr capsule, TAKE 1 CAPSULE(150 MG) BY MOUTH DAILY WITH BREAKFAST, Disp: 30 capsule, Rfl: 1   vitamin B-12 (CYANOCOBALAMIN) 1000 MCG tablet, Take 1,000 mcg by mouth daily., Disp: , Rfl:   Physical exam:  Vitals:   11/12/20 0912  BP: 119/77  Pulse: 97  Resp: 17  Temp: 97.7 F (36.5 C)  TempSrc: Tympanic  SpO2: 97%  Weight: 166 lb (75.3 kg)   Physical Exam Constitutional:      General: She is not in acute distress.    Appearance: She is well-developed. She is not ill-appearing.  HENT:     Head: Atraumatic.     Nose: Nose normal.     Mouth/Throat:     Pharynx: No oropharyngeal exudate.  Eyes:     General: No scleral icterus.    Conjunctiva/sclera: Conjunctivae normal.  Cardiovascular:     Rate and Rhythm: Normal rate and regular rhythm.  Pulmonary:     Effort: Pulmonary effort is normal.     Breath sounds: Normal breath sounds.  Abdominal:     General: Bowel sounds are normal. There is no distension.     Palpations: Abdomen is soft.     Tenderness: There is no abdominal tenderness. There is no right CVA tenderness, left CVA tenderness, guarding or rebound.     Hernia: No hernia is present.  Musculoskeletal:        General: No tenderness or deformity. Normal range of motion.     Cervical back: Normal range of motion and neck supple.  Skin:    General: Skin is warm and dry.     Coloration: Skin is not pale.  Neurological:     General: No focal deficit present.     Mental Status: She is alert and oriented to person, place, and time.     Motor: No weakness.  Psychiatric:        Mood and Affect: Mood normal.        Behavior: Behavior normal.     CMP Latest Ref Rng & Units 11/12/2020  Glucose 70 - 99 mg/dL 116(H)  BUN  6 - 20 mg/dL 13  Creatinine 0.44 - 1.00 mg/dL  0.76  Sodium 135 - 145 mmol/L 137  Potassium 3.5 - 5.1 mmol/L 3.8  Chloride 98 - 111 mmol/L 101  CO2 22 - 32 mmol/L 29  Calcium 8.9 - 10.3 mg/dL 9.4  Total Protein 6.5 - 8.1 g/dL 6.9  Total Bilirubin 0.3 - 1.2 mg/dL 0.5  Alkaline Phos 38 - 126 U/L 70  AST 15 - 41 U/L 29  ALT 0 - 44 U/L 41   CBC Latest Ref Rng & Units 11/12/2020  WBC 4.0 - 10.5 K/uL 4.0  Hemoglobin 12.0 - 15.0 g/dL 12.0  Hematocrit 36.0 - 46.0 % 35.3(L)  Platelets 150 - 400 K/uL 340   Urinalysis    Component Value Date/Time   COLORURINE YELLOW (A) 11/12/2020 1050   APPEARANCEUR CLEAR (A) 11/12/2020 1050   LABSPEC 1.008 11/12/2020 1050   PHURINE 6.0 11/12/2020 1050   GLUCOSEU NEGATIVE 11/12/2020 1050   HGBUR NEGATIVE 11/12/2020 [place] 11/12/2020 1050   BILIRUBINUR Neg 01/18/2012 1458   KETONESUR NEGATIVE 11/12/2020 1050   PROTEINUR NEGATIVE 11/12/2020 1050   UROBILINOGEN 0.2 01/18/2012 1458   NITRITE NEGATIVE 11/12/2020 1050   LEUKOCYTESUR TRACE (A) 11/12/2020 1050   11/12/20- CT Chest abdomen pelvis w contrast Cardiovascular: Normal heart size. No significant pericardial effusion/thickening. Great vessels are normal in course and caliber. No central pulmonary emboli.   Mediastinum/Nodes: No discrete thyroid nodules. Unremarkable esophagus. Surgical clips again noted throughout the right axilla. No pathologically enlarged axillary nodes. Previously noted mildly prominent rounded left supraclavicular and high left mediastinal lymph nodes have slightly decreased, for example measuring 0.6 cm (series 2/image 12), decreased from 0.8 cm. No pathologically enlarged mediastinal or hilar nodes.   Lungs/Pleura: No pneumothorax. No right pleural effusion. Diffuse irregular left pleural thickening and enhancement, similar. Small left pleural effusion is decreased. Stable prominent volume loss in the left hemithorax. Previously visualized  interlobular septal thickening and extensive patchy consolidation and ground-glass opacity in the left lung has significantly improved, with residual thick bandlike consolidation in the lingula and left lower lobe. Several (at least 8) solid pulmonary nodules scattered throughout the right greater than left lungs, several of which are new or increased and two of which are stable. For example, a 0.6 cm peripheral right lower lobe solid nodule (series 4/image 102) is increased from 0.2 cm. Posterior right upper lobe 0.3 cm (series 4/image 34) and posterior basilar right lower lobe 0.3 cm (series 4/image 123) solid nodules are new. Anteromedial left upper lobe 0.4 cm nodule (series 4/image 50) is new. Posterior right lower lobe 0.7 cm nodule (series 4/image 89), previously 0.8 cm, not substantially changed.   Musculoskeletal: New round 1.2 cm T9 vertebral lesion with peripheral sclerosis (series 6/image 92). Numerous scalloping lytic lesions throughout the lateral left sixth through tenth ribs (series 6/images 147-151), largely new. New pathologic anterior left seventh rib fracture.   CT ABDOMEN PELVIS FINDINGS   Hepatobiliary: New 1.0 cm hypodense peripheral right liver lesion (series 2/image 52). New indistinct 0.7 cm hypodense anterior left liver lesion (series 2/image 49). No additional liver lesions. Normal gallbladder with no radiopaque cholelithiasis. No biliary ductal dilatation.   Pancreas: Normal, with no mass or duct dilation.   Spleen: Normal size. No mass.   Adrenals/Urinary Tract: Normal adrenals. Normal kidneys with no hydronephrosis and no renal mass. Normal bladder.   Stomach/Bowel: Small hiatal hernia. Otherwise normal nondistended stomach. Normal caliber small bowel with no small bowel wall thickening. Normal appendix. Normal large bowel with no diverticulosis, large bowel wall  thickening or pericolonic fat stranding.   Vascular/Lymphatic: Normal caliber  abdominal aorta. Patent portal, splenic, hepatic and renal veins. No pathologically enlarged lymph nodes in the abdomen or pelvis.   Reproductive: Intrauterine device appears grossly well-positioned in the uterine cavity. Grossly normal anteverted uterus. No adnexal mass.   Other: No pneumoperitoneum, ascites or focal fluid collection.   Musculoskeletal: No aggressive appearing focal osseous lesions. Marked degenerative disc disease at L5-S1.   IMPRESSION: 1. Mixed interval response, although overall compatible with progression of metastatic disease. 2. Two new low-attenuation liver lesions, suspicious for new liver metastases. 3. New sclerotic T9 vertebral metastasis. Numerous scalloping lytic lesions throughout the lateral left sixth through tenth ribs, largely new, compatible with osseous metastases. New pathologic anterior left seventh rib fracture. 4. Several new or enlarging small pulmonary nodules scattered throughout the right greater than left lungs, suspicious for progression of pulmonary metastases. 5. Diffuse irregular left pleural thickening and enhancement, similar. Small left pleural effusion is decreased. 6. Previously visualized interlobular septal thickening and extensive patchy consolidation and ground-glass opacity in the left lung has significantly improved, with residual thick bandlike consolidation in the lingula and left lower lobe. 7. Previously noted mildly prominent rounded left supraclavicular and high left mediastinal lymph nodes have slightly decreased. 8. Small hiatal hernia.   Assessment and plan- Patient is a 40 y.o. female diagnosed with metastatic breast cancer to lung, currently on ibrance and letrozole who presents to Symptom Management Clinic for acute left lower abdominal pain. Etiology unclear. She is currently undergoing ovarian suppression so ovarian etiologies are less likely. Will check UA to evaluate for UTI. Recommend CT with contrast to evaluate  for other etiologies including malignancy. In clinic, plan for IV fluids, morphine 4 mg IV.   External hemorrhoids- will refer to Dr. Marius Ditch for evaluation and management. Hold treatments if patient is neutropenic. She is currently using topical steroids with moderate improvement.   -- CT reviewed and consistent with progressive vs mixed response. New metastatic disease in lungs, liver, and bone. Etiology of her presenting pain is still unclear and I question referred pain from left sided metastatic disease in ribs and pathologic fracture in left seventh rib. Findings reviewed with Dr. Grayland Ormond to recommends PET scan to evaluate further. Will continue management of her pain with oxycodone 5 mg q6h prn.   I spoke to patient and reviewed results. Pain currently moderately controlled with oxycodone. Reviewed plan for PET and follow up with Dr. Grayland Ormond for results and treatment planning. I've asked her to notify clinic if pain is not well controlled as we can consider options for management of bone disease such as bisphosphonates, radiation, etc.   Return to clinic prn.    Visit Diagnosis 1. Left lower quadrant abdominal pain   2. Recurrent breast cancer, right ([place])   3. External hemorrhoid     Patient expressed understanding and was in agreement with this plan. She also understands that She can call clinic at any time with any questions, concerns, or complaints.   Thank you for allowing me to participate in the care of this very pleasant patient.   Beckey Rutter, DNP, AGNP-C [facility]

## 2020-11-13 ENCOUNTER — Telehealth: Payer: Self-pay | Admitting: Nurse Practitioner

## 2020-11-13 ENCOUNTER — Encounter: Payer: Self-pay | Admitting: Oncology

## 2020-11-13 LAB — URINE CULTURE: Culture: <10000 — AB

## 2020-11-13 NOTE — Telephone Encounter (Signed)
Spoke to patient re: concerns. She questions getting PET scheduled and if she should be continuing ibrance & letrozole. Advised to continue ibrance & letrozole. Awaiting insurance approval for PET. Pain has resolved today.

## 2020-11-18 ENCOUNTER — Other Ambulatory Visit: Payer: Self-pay

## 2020-11-18 ENCOUNTER — Encounter
Admission: RE | Admit: 2020-11-18 | Discharge: 2020-11-18 | Disposition: A | Discharge status: Home / Self Care | Payer: BC Managed Care – PPO | Source: Ambulatory Visit | Attending: Nurse Practitioner | Admitting: Nurse Practitioner

## 2020-11-18 DIAGNOSIS — R1032 Left lower quadrant pain: Secondary | ICD-10-CM | POA: Diagnosis not present

## 2020-11-18 DIAGNOSIS — C50911 Malignant neoplasm of unspecified site of right female breast: Secondary | ICD-10-CM | POA: Diagnosis present

## 2020-11-18 LAB — GLUCOSE, CAPILLARY: Glucose-Capillary: 83 mg/dL (ref 70–99)

## 2020-11-18 IMAGING — PT NM PET TUM IMG RESTAG (PS) SKULL BASE T - THIGH
1 of 10 series · 1 of 25 positions shown · non-contrast
Comparison: 80 CT [DATE], PET-CT [DATE]

CLINICAL DATA: Subsequent treatment strategy for recurrent breast
cancer. RIGHT breast cancer.

EXAM:
NUCLEAR MEDICINE PET SKULL BASE TO THIGH
TECHNIQUE: 9.1 mCi F-18 FDG was injected intravenously. Full-ring PET imaging
was performed from the skull base to thigh after the radiotracer. CT
data was obtained and used for attenuation correction and anatomic
localization.
Fasting blood glucose: 83 mg/dl

[Series 3: ct wb 5.0 b30f · axial · 5.0mm · 0.98mm/px · 1 of 290 slices shown]
[im 290/290  brain]
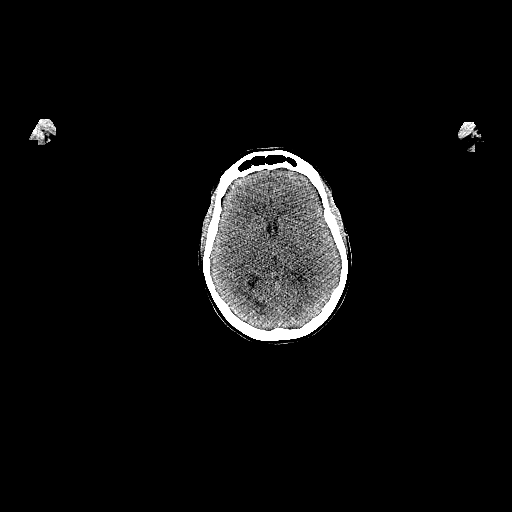

[1 of 25 positions shown; findings below may reference images not displayed]

FINDINGS: Mediastinal blood pool activity: SUV max

Liver activity: SUV max NA

NECK: No hypermetabolic lymph nodes in the neck.

Incidental CT findings: none

CHEST: Considerable reduction in metabolic activity of the
hypermetabolic pleural thickening in the LEFT hemithorax. A rim of
hypermetabolic thickening persists but is less intense. For example
in the LEFT lower lobe posterolaterally the hypermetabolic pleural
thickening has SUV max equal 4.8 compared to SUV max equal 9.7 on
comparison PET-CT exam. Metabolic activity is near completely
resolved in the LEFT upper lobe with SUV max equal 2.3 compared SUV
max equal 5.1. Hypermetabolic pleural thickening does remain along
the mediastinal pleural surface at the level. For example the level
of the aortic arch SUV max equal 5.0 compares to SUV max equal

No discrete hypermetabolic pulmonary nodules. Interval reduction in
the loculated LEFT pleural effusion. Atelectasis remains at the LEFT
lung base and pleural thickening along the fissure.

No hypermetabolic mediastinal lymph nodes.

Within the RIGHT lower lobe discrete nodule measures 6 mm (image
105/3) compared to 7 mm on prior. No hypermetabolic activity. No new
pulmonary nodules in the RIGHT lung. Several scattered smaller
nodules are present

Incidental CT findings: none

ABDOMEN/PELVIS: No abnormal hypermetabolic activity within the
liver, pancreas, adrenal glands, or spleen. No hypermetabolic lymph
nodes in the abdomen or pelvis.

Incidental CT findings: none

SKELETON: At T10, new rounded lytic lesion with a sclerotic rim
measures 10 mm on image 113/series 3. This vertebral body lesion has
mild metabolic activity with SUV max equal 3.5. Lesion is new from
comparison PET-CT scan.

Incidental CT findings: Additional lytic change within the LEFT
lateral rib described on comparison CT. No clear hypermetabolic
activity. The metabolic activities ribs is difficult to assess due
to the adjacent intense pleural activity.
IMPRESSION: 1. Marked reduction in metabolic activity of pleural thickening in
the LEFT hemithorax. Residual hypermetabolic pleural thickening does
remain but again much reduced in metabolic activity. Reduction in
LEFT pleural effusion.
2. Nodules in RIGHT lung are similar comparison exam. No metabolic
activity.
3. No mediastinal or supraclavicular adenopathy identified.
4. New lytic lesion in the T10 vertebral body with metabolic
activity consistent skeletal metastasis.
5. Lytic lesions of the LEFT lateral ribs also consistent metastatic
disease. Metabolic activity difficult to assess sign due to adjacent
intense pleural activity.

## 2020-11-18 MED ORDER — FLUDEOXYGLUCOSE F - 18 (FDG) INJECTION
8.6000 | Freq: Once | INTRAVENOUS | Status: AC | PRN
  Administered 2020-11-18: 9.12 via INTRAVENOUS

## 2020-11-21 ENCOUNTER — Telehealth: Payer: Self-pay | Admitting: Emergency Medicine

## 2020-11-21 ENCOUNTER — Other Ambulatory Visit: Payer: Self-pay | Admitting: Nurse Practitioner

## 2020-11-21 DIAGNOSIS — K769 Liver disease, unspecified: Secondary | ICD-10-CM

## 2020-11-21 MED ORDER — ALPRAZOLAM 0.25 MG PO TABS: 0.2500 mg | ORAL_TABLET | Freq: Two times a day (BID) | ORAL | 0 refills | Status: DC | PRN

## 2020-11-21 NOTE — Telephone Encounter (Signed)
Request for [facility] testing sent to pathology.

## 2020-11-21 NOTE — Progress Notes (Signed)
Spoke to patient regarding results of PET scan and tumor board reocmmendations. In the interim, she has seen MD at [facility] for second opinion. She will have notes sent for review. Consensus was to continue treatment as prescribed and get liver MRI w wo contrast to further characterize liver lesions. Also, start zometa for bone disease. Patient also requests next gen sequencing on tissue per [facility]. She also complains of worsening anxiety interfering with quality of life. She is on effexor xr with xanax for breakthrough symptoms. Requests refill of xanax. Discussion of options today and she elects to follow up with her psychiatrist, Dr. Darleene Cleaver for medication management. Will add zometa to treatment plan and add to her upcoming visit. Will ask nursing to assist with ordering NGS. Dr. Grayland Ormond updated.

## 2020-11-22 ENCOUNTER — Encounter: Payer: Self-pay | Admitting: Oncology

## 2020-11-24 ENCOUNTER — Encounter: Payer: Self-pay | Admitting: Nurse Practitioner

## 2020-11-25 ENCOUNTER — Other Ambulatory Visit: Payer: Self-pay | Admitting: Emergency Medicine

## 2020-11-25 ENCOUNTER — Telehealth: Payer: Self-pay | Admitting: Emergency Medicine

## 2020-11-25 DIAGNOSIS — C50911 Malignant neoplasm of unspecified site of right female breast: Secondary | ICD-10-CM

## 2020-11-25 DIAGNOSIS — R1032 Left lower quadrant pain: Secondary | ICD-10-CM

## 2020-11-25 MED ORDER — OXYCODONE HCL 5 MG PO TABS: 5.0000 mg | ORAL_TABLET | Freq: Four times a day (QID) | ORAL | 0 refills | Status: DC | PRN

## 2020-11-25 NOTE — Telephone Encounter (Signed)
Spoke with Delsa Bern from [facility] as well as [facility] pathology. Specimen block for omniseq testing will be sent from [facility] to [facility] pathology so that [facility] path will forward specimen to proper laboratory for genetic testing.

## 2020-11-27 ENCOUNTER — Other Ambulatory Visit (HOSPITAL_COMMUNITY): Payer: Self-pay

## 2020-12-02 NOTE — Progress Notes (Signed)
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  INTERVAL HISTORY: Patient returns to clinic today for further evaluation, discussion of her PET scan results, and continuation of treatment.  She recently had a second opinion at [facility] and also had a return visit at [facility] in [place].  She continues to be mildly anxious, but otherwise feels well.  She does not complain of shortness of breath today. She has no neurologic complaints.  She has a good appetite.  She denies any chest pain or hemoptysis, but continues to have intermittent left flank and back pain.  She denies any nausea, vomiting, constipation, or diarrhea.  She has no urinary complaints.  Patient offers no further specific complaints today.  REVIEW OF SYSTEMS:   Review of Systems  Constitutional: Negative.  Negative for fever, malaise/fatigue and weight loss.  Respiratory: Negative.  Negative for cough, hemoptysis and shortness of breath.   Cardiovascular: Negative.  Negative for chest pain and leg swelling.  Gastrointestinal: Negative.  Negative for abdominal pain.  Genitourinary:  Positive for flank pain. Negative for dysuria.  Musculoskeletal:  Positive for back pain.  Skin: Negative.  Negative for rash.  Neurological: Negative.  Negative for dizziness, focal weakness, weakness and headaches.  Psychiatric/Behavioral:  The patient is nervous/anxious. The  patient does not have insomnia.    As per HPI. Otherwise, a complete review of systems is negative.  PAST MEDICAL HISTORY: Past Medical History:  Diagnosis Date   Anxiety    Family history of breast cancer    HSV (herpes simplex virus) anogenital infection    positive titer only    PAST SURGICAL HISTORY: Past Surgical History:  Procedure Laterality Date   BREAST LUMPECTOMY WITH AXILLARY LYMPH NODE BIOPSY Right 11/21/2018   Procedure: RIGHT BREAST LUMPECTOMY WITH SENTINEL LYMPH NODE BIOPSY;  Surgeon: Jovita Kussmaul, MD;  Location: [place];  Service: General;  Laterality: Right;   BREAST REDUCTION SURGERY Bilateral 11/21/2018   Procedure: BILATERAL MAMMARY REDUCTION  (BREAST);  Surgeon: Wallace Going, DO;  Location: [place];  Service: Plastics;  Laterality: Bilateral;   CHEST TUBE INSERTION Left 07/29/2020   Procedure: INSERTION PLEURAL DRAINAGE CATHETER;  Surgeon: Lajuana Matte, MD;  Location: [place];  Service: Thoracic;  Laterality: Left;   IRRIGATION AND DEBRIDEMENT STERNOCLAVICULAR JOINT-STERNUM AND RIBS N/A 08/13/2020   Procedure: IRRIGATION AND DEBRIDEMENT CHEST WALL ABSCESS;  Surgeon: Lajuana Matte, MD;  Location: [place];  Service: Cardiothoracic;  Laterality: N/A;   PLEURAL BIOPSY Left 07/29/2020   Procedure: PLEURAL BIOPSY;  Surgeon: Lajuana Matte, MD;  Location: [place];  Service: Thoracic;  Laterality: Left;   PLEURAL EFFUSION DRAINAGE Left 07/29/2020   Procedure: DRAINAGE OF PLEURAL EFFUSION;  Surgeon: Lajuana Matte, MD;  Location: [place];  Service: Thoracic;  Laterality: Left;   PORT-A-CATH REMOVAL N/A 07/06/2019   Procedure: REMOVAL PORT-A-CATH;  Surgeon: Jovita Kussmaul, MD;  Location: [place];  Service: General;  Laterality: N/A;   PORTACATH PLACEMENT N/A 11/21/2018   Procedure:  INSERTION LEFT PORT-A-CATH WITH ULTRASOUND GUIDANCE;  Surgeon: Jovita Kussmaul, MD;  Location: [place];  Service: General;  Laterality: N/A;   TONSILLECTOMY  1987    TONSILLECTOMY     VIDEO ASSISTED THORACOSCOPY Left 07/29/2020   Procedure: VIDEO ASSISTED THORACOSCOPY;  Surgeon: Lajuana Matte, MD;  Location: [facility];  Service: Thoracic;  Laterality: Left;   WISDOM TOOTH EXTRACTION      FAMILY HISTORY: Family History  Problem Relation Age of Onset   Hypertension Father    Alcohol abuse Paternal Grandfather    Heart disease Paternal Grandfather    Alcohol abuse Maternal Grandfather    Heart disease Maternal Grandmother    Breast cancer Other     ADVANCED DIRECTIVES (Y/N):  N  HEALTH MAINTENANCE: Social History   Tobacco Use   Smoking status: Former    Packs/day: 0.50    Years: 20.00    Pack years: 10.00    Types: Cigarettes    Start date: 01/13/2002    Quit date: 10/17/2016    Years since quitting: 4.1   Smokeless tobacco: Never  Vaping Use   Vaping Use: Never used  Substance Use Topics   Alcohol use: Yes    Comment: Socially   Drug use: No     Colonoscopy:  PAP:  Bone density:  Lipid panel:  Allergies  Allergen Reactions   Betadine [Povidone-Iodine]    Povidone Iodine Rash    Current Outpatient Medications  Medication Sig Dispense Refill   acetaminophen (TYLENOL) 325 MG tablet Take 2 tablets (650 mg total) by mouth every 6 (six) hours as needed for mild pain (or Fever >/= 101).     albuterol (VENTOLIN HFA) 108 (90 Base) MCG/ACT inhaler INHALE 2 PUFFS INTO THE LUNGS EVERY 6 HOURS AS NEEDED FOR WHEEZING OR SHORTNESS OF BREATH 6.7 g 2   ALPRAZolam (XANAX) 0.25 MG tablet Take 1 tablet (0.25 mg total) by mouth 2 (two) times daily as needed for anxiety. 60 tablet 0   amphetamine-dextroamphetamine (ADDERALL XR) 30 MG 24 hr capsule Take 30 mg by mouth daily.     amphetamine-dextroamphetamine (ADDERALL) 10 MG tablet Take 10 mg by mouth daily in the afternoon.     Ascorbic Acid (VITAMIN C) 500 MG CAPS Take 500 mg by mouth daily.     ibuprofen (ADVIL) 200 MG tablet Take 400 mg by mouth every 6 (six) hours as needed for mild  pain.     letrozole (FEMARA) 2.5 MG tablet Take 1 tablet (2.5 mg total) by mouth daily. 90 tablet 3   Multiple Vitamin (MULTIVITAMIN WITH MINERALS) TABS tablet Take 1 tablet by mouth daily.     oxyCODONE (OXY IR/ROXICODONE) 5 MG immediate release tablet Take 1 tablet (5 mg total) by mouth every 6 (six) hours as needed for severe pain. 30 tablet 0   polyethylene glycol (MIRALAX / GLYCOLAX) 17 g packet Take 17 g by mouth daily. 14 each 0   venlafaxine XR (EFFEXOR-XR) 150 MG 24 hr capsule TAKE 1 CAPSULE(150 MG) BY MOUTH DAILY WITH BREAKFAST 30 capsule 1   vitamin B-12 (CYANOCOBALAMIN) 1000 MCG tablet Take 1,000 mcg by mouth daily.     palbociclib (IBRANCE) 125 MG tablet Take 1 tablet (125 mg total) by mouth daily. Take for 21 days on, 7 days off, repeat every 28 days. (Patient not taking: Reported on 12/05/2020) 21 tablet 2   senna-docusate (SENOKOT-S) 8.6-50 MG tablet Stimulant Laxative Plus 8.6 mg-50 mg tablet  TAKE 1 TABLET BY MOUTH TWICE DAILY (  Patient not taking: Reported on 12/05/2020)     Tetrahydrozoline HCl (VISINE OP) Place 1 drop into both eyes daily as needed (redness). (Patient not taking: Reported on 12/05/2020)     No current facility-administered medications for this visit.    OBJECTIVE: Vitals:   12/05/20 1504  BP: 129/83  Pulse: (!) 111  Resp: 16  Temp: 98.8 F (37.1 C)  SpO2: 100%      Body mass index is 28.94 kg/m.    ECOG FS:0 - Asymptomatic  General: Well-developed, well-nourished, no acute distress. Eyes: Pink conjunctiva, anicteric sclera. HEENT: Normocephalic, moist mucous membranes. Lungs: No audible wheezing or coughing. Heart: Regular rate and rhythm. Abdomen: Soft, nontender, no obvious distention. Musculoskeletal: No edema, cyanosis, or clubbing. Neuro: Alert, answering all questions appropriately. Cranial nerves grossly intact. Skin: No rashes or petechiae noted. Psych: Normal affect.  LAB RESULTS:  Lab Results  Component Value Date   NA 137  12/05/2020   K 4.5 12/05/2020   CL 99 12/05/2020   CO2 28 12/05/2020   GLUCOSE 115 (H) 12/05/2020   BUN 16 12/05/2020   CREATININE 0.60 12/05/2020   CALCIUM 9.7 12/05/2020   PROT 8.3 (H) 12/05/2020   ALBUMIN 4.8 12/05/2020   AST 29 12/05/2020   ALT 37 12/05/2020   ALKPHOS 77 12/05/2020   BILITOT 0.3 12/05/2020   GFRNONAA >60 12/05/2020   GFRAA >60 08/07/2019    Lab Results  Component Value Date   WBC 3.6 (L) 12/05/2020   NEUTROABS 2.0 12/05/2020   HGB 13.6 12/05/2020   HCT 39.6 12/05/2020   MCV 97.8 12/05/2020   PLT 264 12/05/2020     STUDIES: CT CHEST ABDOMEN PELVIS W CONTRAST  Result Date: 11/12/2020 CLINICAL DATA:  Recurrent breast cancer with malignant left pleural effusion, with ongoing oral chemotherapy. Restaging. Patient reports left lower quadrant abdominal pain since last night. EXAM: CT CHEST, ABDOMEN, AND PELVIS WITH CONTRAST TECHNIQUE: Multidetector CT imaging of the chest, abdomen and pelvis was performed following the standard protocol during bolus administration of intravenous contrast. CONTRAST:  155mL OMNIPAQUE IOHEXOL 300 MG/ML  SOLN COMPARISON:  08/11/2020 CT chest, abdomen and pelvis. FINDINGS: CT CHEST FINDINGS Cardiovascular: Normal heart size. No significant pericardial effusion/thickening. Great vessels are normal in course and caliber. No central pulmonary emboli. Mediastinum/Nodes: No discrete thyroid nodules. Unremarkable esophagus. Surgical clips again noted throughout the right axilla. No pathologically enlarged axillary nodes. Previously noted mildly prominent rounded left supraclavicular and high left mediastinal lymph nodes have slightly decreased, for example measuring 0.6 cm (series 2/image 12), decreased from 0.8 cm. No pathologically enlarged mediastinal or hilar nodes. Lungs/Pleura: No pneumothorax. No right pleural effusion. Diffuse irregular left pleural thickening and enhancement, similar. Small left pleural effusion is decreased. Stable  prominent volume loss in the left hemithorax. Previously visualized interlobular septal thickening and extensive patchy consolidation and ground-glass opacity in the left lung has significantly improved, with residual thick bandlike consolidation in the lingula and left lower lobe. Several (at least 8) solid pulmonary nodules scattered throughout the right greater than left lungs, several of which are new or increased and two of which are stable. For example, a 0.6 cm peripheral right lower lobe solid nodule (series 4/image 102) is increased from 0.2 cm. Posterior right upper lobe 0.3 cm (series 4/image 34) and posterior basilar right lower lobe 0.3 cm (series 4/image 123) solid nodules are new. Anteromedial left upper lobe 0.4 cm nodule (series 4/image 50) is new. Posterior right lower lobe 0.7 cm nodule (series 4/image 89),  previously 0.8 cm, not substantially changed. Musculoskeletal: New round 1.2 cm T9 vertebral lesion with peripheral sclerosis (series 6/image 92). Numerous scalloping lytic lesions throughout the lateral left sixth through tenth ribs (series 6/images 147-151), largely new. New pathologic anterior left seventh rib fracture. CT ABDOMEN PELVIS FINDINGS Hepatobiliary: New 1.0 cm hypodense peripheral right liver lesion (series 2/image 52). New indistinct 0.7 cm hypodense anterior left liver lesion (series 2/image 49). No additional liver lesions. Normal gallbladder with no radiopaque cholelithiasis. No biliary ductal dilatation. Pancreas: Normal, with no mass or duct dilation. Spleen: Normal size. No mass. Adrenals/Urinary Tract: Normal adrenals. Normal kidneys with no hydronephrosis and no renal mass. Normal bladder. Stomach/Bowel: Small hiatal hernia. Otherwise normal nondistended stomach. Normal caliber small bowel with no small bowel wall thickening. Normal appendix. Normal large bowel with no diverticulosis, large bowel wall thickening or pericolonic fat stranding. Vascular/Lymphatic: Normal  caliber abdominal aorta. Patent portal, splenic, hepatic and renal veins. No pathologically enlarged lymph nodes in the abdomen or pelvis. Reproductive: Intrauterine device appears grossly well-positioned in the uterine cavity. Grossly normal anteverted uterus. No adnexal mass. Other: No pneumoperitoneum, ascites or focal fluid collection. Musculoskeletal: No aggressive appearing focal osseous lesions. Marked degenerative disc disease at L5-S1. IMPRESSION: 1. Mixed interval response, although overall compatible with progression of metastatic disease. 2. Two new low-attenuation liver lesions, suspicious for new liver metastases. 3. New sclerotic T9 vertebral metastasis. Numerous scalloping lytic lesions throughout the lateral left sixth through tenth ribs, largely new, compatible with osseous metastases. New pathologic anterior left seventh rib fracture. 4. Several new or enlarging small pulmonary nodules scattered throughout the right greater than left lungs, suspicious for progression of pulmonary metastases. 5. Diffuse irregular left pleural thickening and enhancement, similar. Small left pleural effusion is decreased. 6. Previously visualized interlobular septal thickening and extensive patchy consolidation and ground-glass opacity in the left lung has significantly improved, with residual thick bandlike consolidation in the lingula and left lower lobe. 7. Previously noted mildly prominent rounded left supraclavicular and high left mediastinal lymph nodes have slightly decreased. 8. Small hiatal hernia. Electronically Signed   By: Ilona Sorrel M.D.   On: 11/12/2020 12:26   NM PET Image Restag (PS) Skull Base To Thigh  Result Date: 11/18/2020 CLINICAL DATA:  Subsequent treatment strategy for recurrent breast cancer. RIGHT breast cancer. EXAM: NUCLEAR MEDICINE PET SKULL BASE TO THIGH TECHNIQUE: 9.1 mCi F-18 FDG was injected intravenously. Full-ring PET imaging was performed from the skull base to thigh after  the radiotracer. CT data was obtained and used for attenuation correction and anatomic localization. Fasting blood glucose: 83 mg/dl COMPARISON:  80 CT 11/12/2020, PET-CT 07/18/2020 FINDINGS: Mediastinal blood pool activity: SUV max 1.9 Liver activity: SUV max NA NECK: No hypermetabolic lymph nodes in the neck. Incidental CT findings: none CHEST: Considerable reduction in metabolic activity of the hypermetabolic pleural thickening in the LEFT hemithorax. A rim of hypermetabolic thickening persists but is less intense. For example in the LEFT lower lobe posterolaterally the hypermetabolic pleural thickening has SUV max equal 4.8 compared to SUV max equal 9.7 on comparison PET-CT exam. Metabolic activity is near completely resolved in the LEFT upper lobe with SUV max equal 2.3 compared SUV max equal 5.1. Hypermetabolic pleural thickening does remain along the mediastinal pleural surface at the level. For example the level of the aortic arch SUV max equal 5.0 compares to SUV max equal 13.9 No discrete hypermetabolic pulmonary nodules. Interval reduction in the loculated LEFT pleural effusion. Atelectasis remains at the LEFT lung base  and pleural thickening along the fissure. No hypermetabolic mediastinal lymph nodes. Within the RIGHT lower lobe discrete nodule measures 6 mm (image 105/3) compared to 7 mm on prior. No hypermetabolic activity. No new pulmonary nodules in the RIGHT lung. Several scattered smaller nodules are present Incidental CT findings: none ABDOMEN/PELVIS: No abnormal hypermetabolic activity within the liver, pancreas, adrenal glands, or spleen. No hypermetabolic lymph nodes in the abdomen or pelvis. Incidental CT findings: none SKELETON: At T10, new rounded lytic lesion with a sclerotic rim measures 10 mm on image 113/series 3. This vertebral body lesion has mild metabolic activity with SUV max equal 3.5. Lesion is new from comparison PET-CT scan. Incidental CT findings: Additional lytic change  within the LEFT lateral rib described on comparison CT. No clear hypermetabolic activity. The metabolic activities ribs is difficult to assess due to the adjacent intense pleural activity. IMPRESSION: 1. Marked reduction in metabolic activity of pleural thickening in the LEFT hemithorax. Residual hypermetabolic pleural thickening does remain but again much reduced in metabolic activity. Reduction in LEFT pleural effusion. 2. Nodules in RIGHT lung are similar comparison exam. No metabolic activity. 3. No mediastinal or supraclavicular adenopathy identified. 4. New lytic lesion in the T10 vertebral body with metabolic activity consistent skeletal metastasis. 5. Lytic lesions of the LEFT lateral ribs also consistent metastatic disease. Metabolic activity difficult to assess sign due to adjacent intense pleural activity. Electronically Signed   By: Suzy Bouchard M.D.   On: 11/18/2020 16:19    ONCOLOGY HISTORY: Patient initially self palpated a lump in the 12 o'clock position of her right breast and subsequently underwent biopsy on September 26, 2018 confirming malignancy.  Initial MammaPrint was reported high risk.  She underwent right lumpectomy on November 21, 2018 which revealed a T2, N1, M0 stage IIa malignancy.  1 of 4 lymph nodes were positive for disease.  She subsequently underwent adjuvant chemotherapy with AC/Taxol completing treatment on Jun 01, 2019.  She then completed adjuvant XRT on August 11, 2019 and was started on tamoxifen.  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  MRI of the brain on August 18, 2020 was negative for metastatic disease.   Patient also underwent second opinion at [facility] in [place].  Agree with recommendation to proceed with Lupron 3.75 mg every 28 days for up to 24 months for ovarian suppression along with an aromatase inhibitor and Ibrance 125 mg for 21 days with 7 days off.  She has been instructed to discontinue tamoxifen.  Repeat PET scan on November 18, 2020 reviewed independently and reported as above with marked improvement of metabolic activity in the left lung, but concern for new lytic lesion at T10 vertebrae and left ribs.  Patient was once again seen in [facility] also had second opinion at [facility] and all parties are in agreement to continue current treatment for 2 additional months and then repeat PET scan.  Plan to get an MRI of her liver as well as her thoracic spine to further evaluate.  Proceed with cycle 5 of Ibrance and Lupron today as planned.  Continue daily letrozole.  Return to clinic in 4 weeks for further evaluation, continuation of treatment, and discussion of  her MRI results.  Appreciate clinical pharmacy input. Cough/shortness of breath: Improved.  PET scan as above.  Continue Tussionex, hydrocodone, and albuterol inhaler.  Recent ultrasound did not reveal a significant amount of fluid for thoracentesis.   Anasarca/fluid retention: Resolved.   Pleural abscess: Treated and drained by thoracic surgery in [place].  Resolved.  Chest x-ray and possible thoracentesis as above.   Thrombocytosis: Resolved. Hypercalcemia: Resolved. Transaminitis: Resolved.  Patient expressed understanding and was in agreement with this plan. She also understands that She can call clinic at any time with any questions, concerns, or complaints.     Cancer Staging  Malignant neoplasm of upper-outer quadrant of right breast in female, estrogen receptor positive ([place]) Staging form: Breast, AJCC 8th Edition - Clinical stage from 10/05/2018: Stage IB (cT2, cN0, cM0, G2, ER+, PR+, HER2-) - Signed by  Chauncey Cruel, MD on 04/02/2020 Stage prefix: Initial diagnosis Histologic grading system: 3 grade system - Pathologic stage from 08/09/2020: No Stage Recommended (ypT2, pN1a, pM1, G2, ER+, PR+, HER2-) - Signed by Lloyd Huger, MD on 08/09/2020 Stage prefix: Post-therapy Multigene prognostic tests performed: MammaPrint Histologic grading system: 3 grade system   Lloyd Huger, MD   12/06/2020 2:09 PM

## 2020-12-03 ENCOUNTER — Encounter: Payer: Self-pay | Admitting: *Deleted

## 2020-12-03 ENCOUNTER — Other Ambulatory Visit: Payer: Self-pay | Admitting: *Deleted

## 2020-12-03 ENCOUNTER — Other Ambulatory Visit: Payer: Self-pay | Admitting: Oncology

## 2020-12-03 DIAGNOSIS — R1032 Left lower quadrant pain: Secondary | ICD-10-CM

## 2020-12-03 DIAGNOSIS — C50411 Malignant neoplasm of upper-outer quadrant of right female breast: Secondary | ICD-10-CM

## 2020-12-04 ENCOUNTER — Ambulatory Visit: Payer: BC Managed Care – PPO | Admitting: Dermatology

## 2020-12-05 ENCOUNTER — Inpatient Hospital Stay (HOSPITAL_BASED_OUTPATIENT_CLINIC_OR_DEPARTMENT_OTHER): Payer: BC Managed Care – PPO | Admitting: Oncology

## 2020-12-05 ENCOUNTER — Ambulatory Visit: Payer: BC Managed Care – PPO

## 2020-12-05 ENCOUNTER — Inpatient Hospital Stay: Payer: BC Managed Care – PPO | Attending: Oncology

## 2020-12-05 ENCOUNTER — Other Ambulatory Visit: Payer: Self-pay

## 2020-12-05 ENCOUNTER — Inpatient Hospital Stay: Payer: BC Managed Care – PPO | Attending: Oncology | Admitting: Pharmacist

## 2020-12-05 ENCOUNTER — Inpatient Hospital Stay: Payer: BC Managed Care – PPO

## 2020-12-05 ENCOUNTER — Encounter: Payer: Self-pay | Admitting: Oncology

## 2020-12-05 VITALS — BP 129/83 | HR 111 | Temp 98.8°F | Resp 16 | Wt 168.6 lb

## 2020-12-05 DIAGNOSIS — Z170 Estrogen receptor positive status [ER+]: Secondary | ICD-10-CM

## 2020-12-05 DIAGNOSIS — C50411 Malignant neoplasm of upper-outer quadrant of right female breast: Secondary | ICD-10-CM

## 2020-12-05 DIAGNOSIS — Z5111 Encounter for antineoplastic chemotherapy: Secondary | ICD-10-CM | POA: Insufficient documentation

## 2020-12-05 DIAGNOSIS — Z79811 Long term (current) use of aromatase inhibitors: Secondary | ICD-10-CM | POA: Insufficient documentation

## 2020-12-05 DIAGNOSIS — G629 Polyneuropathy, unspecified: Secondary | ICD-10-CM | POA: Insufficient documentation

## 2020-12-05 DIAGNOSIS — C7951 Secondary malignant neoplasm of bone: Secondary | ICD-10-CM | POA: Diagnosis not present

## 2020-12-05 DIAGNOSIS — C782 Secondary malignant neoplasm of pleura: Secondary | ICD-10-CM | POA: Diagnosis not present

## 2020-12-05 LAB — COMPREHENSIVE METABOLIC PANEL
ALT: 37 U/L (ref 0–44)
AST: 29 U/L (ref 15–41)
Albumin: 4.8 g/dL (ref 3.5–5.0)
Alkaline Phosphatase: 77 U/L (ref 38–126)
Anion gap: 10 (ref 5–15)
BUN: 16 mg/dL (ref 6–20)
CO2: 28 mmol/L (ref 22–32)
Calcium: 9.7 mg/dL (ref 8.9–10.3)
Chloride: 99 mmol/L (ref 98–111)
Creatinine, Ser: 0.6 mg/dL (ref 0.44–1.00)
GFR, Estimated: >60 mL/min (ref >60)
Glucose, Bld: 115 mg/dL — ABNORMAL HIGH (ref 70–99)
Potassium: 4.5 mmol/L (ref 3.5–5.1)
Sodium: 137 mmol/L (ref 135–145)
Total Bilirubin: 0.3 mg/dL (ref 0.3–1.2)
Total Protein: 8.3 g/dL — ABNORMAL HIGH (ref 6.5–8.1)

## 2020-12-05 LAB — CBC WITH DIFFERENTIAL/PLATELET
Abs Immature Granulocytes: 0.01 10*3/uL (ref 0.00–0.07)
Basophils Absolute: 0.1 10*3/uL (ref 0.0–0.1)
Basophils Relative: 1 %
Eosinophils Absolute: 0.1 10*3/uL (ref 0.0–0.5)
Eosinophils Relative: 2 %
HCT: 39.6 % (ref 36.0–46.0)
Hemoglobin: 13.6 g/dL (ref 12.0–15.0)
Immature Granulocytes: 0 %
Lymphocytes Relative: 30 %
Lymphs Abs: 1.1 10*3/uL (ref 0.7–4.0)
MCH: 33.6 pg (ref 26.0–34.0)
MCHC: 34.3 g/dL (ref 30.0–36.0)
MCV: 97.8 fL (ref 80.0–100.0)
Monocytes Absolute: 0.5 10*3/uL (ref 0.1–1.0)
Monocytes Relative: 12 %
Neutro Abs: 2 10*3/uL (ref 1.7–7.7)
Neutrophils Relative %: 55 %
Platelets: 264 10*3/uL (ref 150–400)
RBC: 4.05 MIL/uL (ref 3.87–5.11)
RDW: 14.8 % (ref 11.5–15.5)
WBC: 3.6 10*3/uL — ABNORMAL LOW (ref 4.0–10.5)
nRBC: 0 % (ref 0.0–0.2)

## 2020-12-05 LAB — MAGNESIUM: Magnesium: 2.2 mg/dL (ref 1.7–2.4)

## 2020-12-05 MED ORDER — LEUPROLIDE ACETATE 3.75 MG IM KIT
3.7500 mg | PACK | Freq: Once | INTRAMUSCULAR | Status: AC
  Administered 2020-12-05: 3.75 mg via INTRAMUSCULAR
  Filled 2020-12-05: qty 3.75

## 2020-12-05 MED ORDER — ZOLEDRONIC ACID 4 MG/100ML IV SOLN
4.0000 mg | Freq: Once | INTRAVENOUS | Status: AC
  Administered 2020-12-05: 4 mg via INTRAVENOUS
  Filled 2020-12-05: qty 100

## 2020-12-05 MED ORDER — SODIUM CHLORIDE 0.9 % IV SOLN
INTRAVENOUS | Status: DC
  Filled 2020-12-05: qty 250

## 2020-12-05 MED ORDER — SODIUM CHLORIDE 0.9% FLUSH
10.0000 mL | Freq: Once | INTRAVENOUS | Status: DC
  Filled 2020-12-05: qty 10

## 2020-12-05 NOTE — Progress Notes (Signed)
Pt has some imaging questions as well as some questions regarding her zometa infusion today. Also, questioning her oncotype results and when will they return.

## 2020-12-05 NOTE — Progress Notes (Signed)
[facility]  Telephone:([phone] Fax:([phone]) [phone]  Patient Care Team: Dion Body, MD as PCP - General (Family Medicine) Gery Pray, MD as Consulting Physician (Radiation Oncology) Jovita Kussmaul, MD as Consulting Physician (General Surgery) Dillingham, Loel Lofty, DO as Attending Physician (Plastic Surgery) Lloyd Huger, MD as Consulting Physician (Oncology)   Name of the patient: Kaylee Romero  [id number]  05/12/1980   Date of visit: 12/05/20  HPI: Patient is a 40 y.o. female with recurrent metastatic breast cancer. Currently treated with Ibrance (palbociclib), letrozole, and leuprolide (ovarian suppression).   Reason for Consult: Oral chemotherapy follow-up for palbociclib therapy.   PAST MEDICAL HISTORY: Past Medical History:  Diagnosis Date   Anxiety    Family history of breast cancer    HSV (herpes simplex virus) anogenital infection    positive titer only    HEMATOLOGY/ONCOLOGY HISTORY:  Oncology History  Malignant neoplasm of upper-outer quadrant of right breast in female, estrogen receptor positive ([place])  09/29/2018 Initial Diagnosis   Malignant neoplasm of upper-outer quadrant of right breast in female, estrogen receptor positive ([place])   09/2018 -  Anti-estrogen oral therapy   Tamoxifen; held from 12/05/2018-09/06/2019 for surgery   10/05/2018 Cancer Staging   Staging form: Breast, AJCC 8th Edition - Clinical stage from 10/05/2018: Stage IB (cT2, cN0, cM0, G2, ER+, PR+, HER2-) - Signed by Chauncey Cruel, MD on 04/02/2020 Stage prefix: Initial diagnosis Histologic grading system: 3 grade system    10/13/2018 Genetic Testing   Negative genetic testing. No pathogenic variants identified on the [facility] Breast Cancer STAT Panel + Common Hereditary Cancers Panel. The Common Hereditary Cancers Panel offered by [facility] includes sequencing and/or deletion duplication testing of the following 47 genes: APC,  ATM, AXIN2, BARD1, BMPR1A, BRCA1, BRCA2, BRIP1, CDH1, CDKN2A (p14ARF), CDKN2A (p16INK4a), CKD4, CHEK2, CTNNA1, DICER1, EPCAM (Deletion/duplication testing only), GREM1 (promoter region deletion/duplication testing only), KIT, MEN1, MLH1, MSH2, MSH3, MSH6, MUTYH, NBN, NF1, NHTL1, PALB2, PDGFRA, PMS2, POLD1, POLE, PTEN, RAD50, RAD51C, RAD51D, SDHB, SDHC, SDHD, SMAD4, SMARCA4. STK11, TP53, TSC1, TSC2, and VHL.  The following genes were evaluated for sequence changes only: SDHA and HOXB13 c.251G>A variant only. The report date is 10/13/2018.    11/21/2018 Surgery   Right lumpectomy [name]) [phone]): IDC, grade 2, 2.6 cm, with DCIS. Negative margins. 1/4 lymph nodes positive for macrometastasis. ER/PR positive, HER-2 negative.   12/26/2018 - 06/01/2019 Chemotherapy   dexamethasone (DECADRON) 4 MG tablet, 1 of 1 cycle, Start date: 12/05/2018, End date: 03/02/2019  DOXOrubicin (ADRIAMYCIN) chemo injection 114 mg, 60 mg/m2 = 114 mg, Intravenous,  Once, 4 of 4 cycles. Administration: 114 mg (01/05/2019), 114 mg (01/19/2019), 114 mg (02/02/2019), 114 mg (02/15/2019)  palonosetron (ALOXI) injection 0.25 mg, 0.25 mg, Intravenous,  Once, 1 of 1 cycle. Administration: 0.25 mg (01/05/2019)  pegfilgrastim (NEULASTA ONPRO KIT) injection 6 mg, 6 mg, Subcutaneous, Once, 2 of 2 cycles  pegfilgrastim-jmdb (FULPHILA) injection 6 mg, 6 mg, Subcutaneous,  Once, 4 of 4 cycles. Administration: 6 mg (01/07/2019), 6 mg (01/21/2019), 6 mg (02/06/2019), 6 mg (02/17/2019)  cyclophosphamide (CYTOXAN) 1,140 mg in sodium chloride 0.9 % 250 mL chemo infusion, 600 mg/m2 = 1,140 mg, Intravenous,  Once, 4 of 4 cycles. Administration: 1,140 mg (01/05/2019), 1,140 mg (01/19/2019), 1,140 mg (02/02/2019), 1,140 mg (02/15/2019).  PACLitaxel (TAXOL) 150 mg in sodium chloride 0.9 % 250 mL chemo infusion (</= $RemoveBefor'80mg'okqBWqMbTbHp$ /m2), 80 mg/m2 = 150 mg, Intravenous,  Once, 12 of 12 cycles. Administration: 150 mg (03/02/2019), 150 mg (03/09/2019),  150 mg (03/23/2019), 150  mg (03/30/2019), 150 mg (04/06/2019), 150 mg (04/20/2019), 150 mg (04/27/2019), 150 mg (05/03/2019), 150 mg (05/11/2019), 150 mg (05/18/2019), 150 mg (05/25/2019), 150 mg (06/01/2019)  fosaprepitant (EMEND) 150 mg  dexamethasone (DECADRON) 12 mg in sodium chloride 0.9 % 145 mL IVPB, , Intravenous,  Once, 4 of 4 cycles. Administration:  (01/05/2019),  (01/19/2019),  (02/02/2019),  (02/15/2019).   06/21/2019 - 08/11/2019 Radiation Therapy   The patient initially received a dose of 50.4 Gy in 28 fractions to the breast using whole-breast tangent fields. This was delivered using a 3-D conformal technique. The pt received a boost delivering an additional 16 Gy in 8 fractions using a electron boost with 81meV electrons. The total dose was 66.4 Gy.    11/12/2019 Cancer Staging   Staging form: Breast, AJCC 8th Edition - Pathologic: Stage IB (pT2, pN1a, cM0, G2, ER+, PR+, HER2-)     Oncotype testing   MammaPrint high risk     ALLERGIES:  is allergic to betadine [povidone-iodine] and povidone iodine.  MEDICATIONS:  Current Outpatient Medications  Medication Sig Dispense Refill   acetaminophen (TYLENOL) 325 MG tablet Take 2 tablets (650 mg total) by mouth every 6 (six) hours as needed for mild pain (or Fever >/= 101).     albuterol (VENTOLIN HFA) 108 (90 Base) MCG/ACT inhaler INHALE 2 PUFFS INTO THE LUNGS EVERY 6 HOURS AS NEEDED FOR WHEEZING OR SHORTNESS OF BREATH 6.7 g 2   ALPRAZolam (XANAX) 0.25 MG tablet Take 1 tablet (0.25 mg total) by mouth 2 (two) times daily as needed for anxiety. 60 tablet 0   amphetamine-dextroamphetamine (ADDERALL XR) 30 MG 24 hr capsule Take 30 mg by mouth daily.     amphetamine-dextroamphetamine (ADDERALL) 10 MG tablet Take 10 mg by mouth daily in the afternoon.     Ascorbic Acid (VITAMIN C) 500 MG CAPS Take 500 mg by mouth daily.     ibuprofen (ADVIL) 200 MG tablet Take 400 mg by mouth every 6 (six) hours as needed for mild pain.     letrozole (FEMARA) 2.5 MG tablet Take 1 tablet  (2.5 mg total) by mouth daily. 90 tablet 3   Multiple Vitamin (MULTIVITAMIN WITH MINERALS) TABS tablet Take 1 tablet by mouth daily.     oxyCODONE (OXY IR/ROXICODONE) 5 MG immediate release tablet Take 1 tablet (5 mg total) by mouth every 6 (six) hours as needed for severe pain. 30 tablet 0   palbociclib (IBRANCE) 125 MG tablet Take 1 tablet (125 mg total) by mouth daily. Take for 21 days on, 7 days off, repeat every 28 days. (Patient not taking: Reported on 12/05/2020) 21 tablet 2   polyethylene glycol (MIRALAX / GLYCOLAX) 17 g packet Take 17 g by mouth daily. 14 each 0   senna-docusate (SENOKOT-S) 8.6-50 MG tablet Stimulant Laxative Plus 8.6 mg-50 mg tablet  TAKE 1 TABLET BY MOUTH TWICE DAILY (Patient not taking: Reported on 12/05/2020)     Tetrahydrozoline HCl (VISINE OP) Place 1 drop into both eyes daily as needed (redness). (Patient not taking: Reported on 12/05/2020)     venlafaxine XR (EFFEXOR-XR) 150 MG 24 hr capsule TAKE 1 CAPSULE(150 MG) BY MOUTH DAILY WITH BREAKFAST 30 capsule 1   vitamin B-12 (CYANOCOBALAMIN) 1000 MCG tablet Take 1,000 mcg by mouth daily.     No current facility-administered medications for this visit.    VITAL SIGNS: There were no vitals taken for this visit. There were no vitals filed for this visit.  Estimated body mass  index is 28.94 kg/m as calculated from the following:   Height as of 09/10/20: 5\' 4"  (1.626 m).   Weight as of an earlier encounter on 12/05/20: 76.5 kg (168 lb 9.6 oz).  LABS: CBC:    Component Value Date/Time   WBC 3.6 (L) 12/05/2020 1446   HGB 13.6 12/05/2020 1446   HGB 13.1 06/01/2019 1340   HCT 39.6 12/05/2020 1446   PLT 264 12/05/2020 1446   PLT 322 06/01/2019 1340   MCV 97.8 12/05/2020 1446   NEUTROABS 2.0 12/05/2020 1446   LYMPHSABS 1.1 12/05/2020 1446   MONOABS 0.5 12/05/2020 1446   EOSABS 0.1 12/05/2020 1446   BASOSABS 0.1 12/05/2020 1446   Comprehensive Metabolic Panel:    Component Value Date/Time   NA 137 12/05/2020 1446    K 4.5 12/05/2020 1446   CL 99 12/05/2020 1446   CO2 28 12/05/2020 1446   BUN 16 12/05/2020 1446   CREATININE 0.60 12/05/2020 1446   CREATININE 0.59 06/01/2019 1340   GLUCOSE 115 (H) 12/05/2020 1446   CALCIUM 9.7 12/05/2020 1446   AST 29 12/05/2020 1446   AST 21 06/01/2019 1340   ALT 37 12/05/2020 1446   ALT 26 06/01/2019 1340   ALKPHOS 77 12/05/2020 1446   BILITOT 0.3 12/05/2020 1446   BILITOT 0.5 06/01/2019 1340   PROT 8.3 (H) 12/05/2020 1446   ALBUMIN 4.8 12/05/2020 1446     Present during today's visit: patient only  Assessment and Plan: CBC/CMP reviewed, continue palbociclib 125mg  21 days on/7 days off. Today is C5D1.  CA 27.29 collected today and results pending. NGS ordered, results pending.   Oral Chemotherapy Side Effect/Intolerance:  Fatigue: She recently went back to [facility] for a second visit at [facility]. While in [place] was walked a lot and felt like she was able to keep up with her friend who traveled with her. This was different than her recent first trip to [facility] this summer after she was diagnosed with recurrent breast cancer.  No reported nausea and diarrhea  Other: Trouble sleeping: continues, she did notice while in [place] she slept better than at home. She is down to one tylenol PM at night to help with sleep  Oral Chemotherapy Adherence: no missed doses reported No patient barriers to medication adherence identified.   New medications: none reported  Medication Access Issues: no issues, fills at [facility]  Patient expressed understanding and was in agreement with this plan. She also understands that She can call clinic at any time with any questions, concerns, or complaints.   Follow-up plan: f/u in 1 month  Thank you for allowing me to participate in the care of this very pleasant patient.   Time Total: 20 mins  Visit consisted of counseling and education on dealing with issues of symptom management in the setting of serious and  potentially life-threatening illness.Greater than 50%  of this time was spent counseling and coordinating care related to the above assessment and plan.  Signed by: Darl Pikes, PharmD, BCPS, Salley Slaughter, CPP Hematology/Oncology Clinical Pharmacist Practitioner [place]/DB/AP [facility] [phone]  12/05/2020 4:18 PM

## 2020-12-05 NOTE — Patient Instructions (Signed)
[facility]  Discharge Instructions: °Thank you for choosing [facility] to provide your oncology and hematology care.  °If you have a lab appointment with the Cancer Center, please go directly to the Cancer Center and check in at the registration area. ° °Wear comfortable clothing and clothing appropriate for easy access to any Portacath or PICC line.  ° °We strive to give you quality time with your provider. You may need to reschedule your appointment if you arrive late (15 or more minutes).  Arriving late affects you and other patients whose appointments are after yours.  Also, if you miss three or more appointments without notifying the office, you may be dismissed from the clinic at the provider’s discretion.    °  °For prescription refill requests, have your pharmacy contact our office and allow 72 hours for refills to be completed.   ° °Today you received the following : Zometa ° ° °Zoledronic Acid Injection (Hypercalcemia, Oncology) °What is this medication? °ZOLEDRONIC ACID (ZOE le dron ik AS id) slows calcium loss from bones. It high calcium levels in the blood from some kinds of cancer. It may be used in other people at risk for bone loss. °This medicine may be used for other purposes; ask your health care provider or pharmacist if you have questions. °COMMON BRAND NAME(S): Zometa °What should I tell my care team before I take this medication? °They need to know if you have any of these conditions: °cancer °dehydration °dental disease °kidney disease °liver disease °low levels of calcium in the blood °lung or breathing disease (asthma) °receiving steroids like dexamethasone or prednisone °an unusual or allergic reaction to zoledronic acid, other medicines, foods, dyes, or preservatives °pregnant or trying to get pregnant °breast-feeding °How should I use this medication? °This drug is injected into a vein. It is given by a health care provider in a hospital  or clinic setting. °Talk to your health care provider about the use of this drug in children. Special care may be needed. °Overdosage: If you think you have taken too much of this medicine contact a poison control center or emergency room at once. °NOTE: This medicine is only for you. Do not share this medicine with others. °What if I miss a dose? °Keep appointments for follow-up doses. It is important not to miss your dose. Call your health care provider if you are unable to keep an appointment. °What may interact with this medication? °certain antibiotics given by injection °NSAIDs, medicines for pain and inflammation, like ibuprofen or naproxen °some diuretics like bumetanide, furosemide °teriparatide °thalidomide °This list may not describe all possible interactions. Give your health care provider a list of all the medicines, herbs, non-prescription drugs, or dietary supplements you use. Also tell them if you smoke, drink alcohol, or use illegal drugs. Some items may interact with your medicine. °What should I watch for while using this medication? °Visit your health care provider for regular checks on your progress. It may be some time before you see the benefit from this drug. °Some people who take this drug have severe bone, joint, or muscle pain. This drug may also increase your risk for jaw problems or a broken thigh bone. Tell your health care provider right away if you have severe pain in your jaw, bones, joints, or muscles. Tell you health care provider if you have any pain that does not go away or that gets worse. °Tell your dentist and dental surgeon that you   are taking this drug. You should not have major dental surgery while on this drug. See your dentist to have a dental exam and fix any dental problems before starting this drug. Take good care of your teeth while on this drug. Make sure you see your dentist for regular follow-up appointments. °You should make sure you get enough calcium and vitamin  D while you are taking this drug. Discuss the foods you eat and the vitamins you take with your health care provider. °Check with your health care provider if you have severe diarrhea, nausea, and vomiting, or if you sweat a lot. The loss of too much body fluid may make it dangerous for you to take this drug. °You may need blood work done while you are taking this drug. °Do not become pregnant while taking this drug. Women should inform their health care provider if they wish to become pregnant or think they might be pregnant. There is potential for serious harm to an unborn child. Talk to your health care provider for more information. °What side effects may I notice from receiving this medication? °Side effects that you should report to your doctor or health care provider as soon as possible: °allergic reactions (skin rash, itching or hives; swelling of the face, lips, or tongue) °bone pain °infection (fever, chills, cough, sore throat, pain or trouble passing urine) °jaw pain, especially after dental work °joint pain °kidney injury (trouble passing urine or change in the amount of urine) °low blood pressure (dizziness; feeling faint or lightheaded, falls; unusually weak or tired) °low calcium levels (fast heartbeat; muscle cramps or pain; pain, tingling, or numbness in the hands or feet; seizures) °low magnesium levels (fast, irregular heartbeat; muscle cramp or pain; muscle weakness; tremors; seizures) °low red blood cell counts (trouble breathing; feeling faint; lightheaded, falls; unusually weak or tired) °muscle pain °redness, blistering, peeling, or loosening of the skin, including inside the mouth °severe diarrhea °swelling of the ankles, feet, hands °trouble breathing °Side effects that usually do not require medical attention (report to your doctor or health care provider if they continue or are bothersome): °anxious °constipation °coughing °depressed mood °eye irritation, itching, or pain °fever °general  ill feeling or flu-like symptoms °nausea °pain, redness, or irritation at site where injected °trouble sleeping °This list may not describe all possible side effects. Call your doctor for medical advice about side effects. You may report side effects to FDA at 1-800-FDA-1088. °Where should I keep my medication? °This drug is given in a hospital or clinic. It will not be stored at home. °NOTE: This sheet is a summary. It may not cover all possible information. If you have questions about this medicine, talk to your doctor, pharmacist, or health care provider. °© 2022 [employer]vier/Gold Standard (2020-09-10 00:00:00) ° °  °To help prevent nausea and vomiting after your treatment, we encourage you to take your nausea medication as directed. ° °BELOW ARE SYMPTOMS THAT SHOULD BE REPORTED IMMEDIATELY: °*FEVER GREATER THAN 100.4 F (38 °C) OR HIGHER °*CHILLS OR SWEATING °*NAUSEA AND VOMITING THAT IS NOT CONTROLLED WITH YOUR NAUSEA MEDICATION °*UNUSUAL SHORTNESS OF BREATH °*UNUSUAL BRUISING OR BLEEDING °*URINARY PROBLEMS (pain or burning when urinating, or frequent urination) °*BOWEL PROBLEMS (unusual diarrhea, constipation, pain near the anus) °TENDERNESS IN MOUTH AND THROAT WITH OR WITHOUT PRESENCE OF ULCERS (sore throat, sores in mouth, or a toothache) °UNUSUAL RASH, SWELLING OR PAIN  °UNUSUAL VAGINAL DISCHARGE OR ITCHING  ° °Items with * indicate a potential emergency and should be followed up   as soon as possible or go to the Emergency Department if any problems should occur. ° °Please show the CHEMOTHERAPY ALERT CARD or IMMUNOTHERAPY ALERT CARD at check-in to the Emergency Department and triage nurse. ° °Should you have questions after your visit or need to cancel or reschedule your appointment, please contact [facility]  [phone] and follow the prompts.  Office hours are 8:00 a.m. to 4:30 p.m. Monday - Friday. Please note that voicemails left after 4:00 p.m. may not be returned until  the following business day.  We are closed weekends and major holidays. You have access to a nurse at all times for urgent questions. Please call the main number to the clinic [phone] and follow the prompts. ° °For any non-urgent questions, you may also contact your provider using MyChart. We now offer e-Visits for anyone 18 and older to request care online for non-urgent symptoms. For details visit [email or web address]. °  °Also download the MyChart app! Go to the app store, search "MyChart", open the app, select [facility], and log in with your MyChart username and password. ° °Due to Covid, a mask is required upon entering the hospital/clinic. If you do not have a mask, one will be given to you upon arrival. For doctor visits, patients may have 1 support person aged 18 or older with them. For treatment visits, patients cannot have anyone with them due to current Covid guidelines and our immunocompromised population.  °

## 2020-12-06 ENCOUNTER — Encounter: Payer: Self-pay | Admitting: Oncology

## 2020-12-06 LAB — CANCER ANTIGEN 27.29: CA 27.29: 203.6 U/mL — ABNORMAL HIGH (ref 0.0–38.6)

## 2020-12-12 ENCOUNTER — Ambulatory Visit
Admission: RE | Admit: 2020-12-12 | Discharge: 2020-12-12 | Disposition: A | Discharge status: Home / Self Care | Payer: BC Managed Care – PPO | Source: Ambulatory Visit | Attending: Nurse Practitioner | Admitting: Nurse Practitioner

## 2020-12-12 ENCOUNTER — Other Ambulatory Visit: Payer: Self-pay

## 2020-12-12 DIAGNOSIS — K769 Liver disease, unspecified: Secondary | ICD-10-CM | POA: Insufficient documentation

## 2020-12-12 IMAGING — MR MR ABDOMEN WO/W CM
19 series · 48 of 48 positions shown · IV contrast (gadavist)
Comparison: [DATE] CT.  PET [DATE]

CLINICAL DATA: Recurrent breast cancer. New liver lesions on CT,
evaluate for metastatic disease.

EXAM:
MRI ABDOMEN WITHOUT AND WITH CONTRAST
TECHNIQUE: Multiplanar multisequence MR imaging of the abdomen was performed
both before and after the administration of intravenous contrast.
CONTRAST:  7mL GADAVIST GADOBUTROL 1 MMOL/ML IV SOLN

[Series 2: T2 · coronal · 6.0mm · 1.19mm/px · 2 of 30 slices shown (1 of 2)]
[im 1/30]
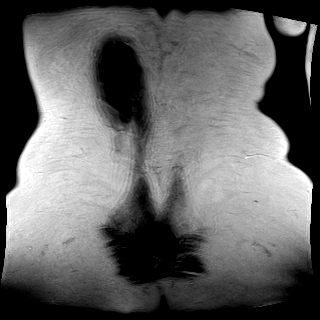
[im 30/30]
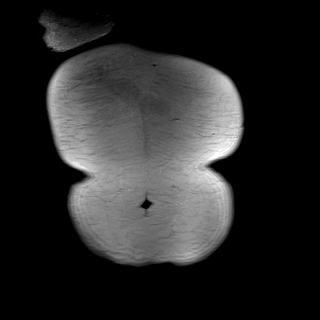

[Series 3: T2 · axial · 6.0mm · 1.19mm/px · z∈[-110,+127]mm · 2 of 34 slices shown (2 of 2)]
[im 1/34]
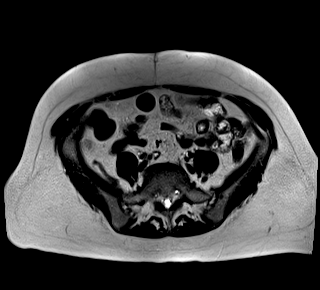
[im 34/34]
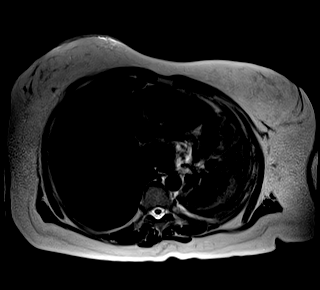

[Series 7: T2 fat-sat · axial · 6.0mm · 1.19mm/px · z∈[-117,+135]mm · 2 of 36 slices shown (1 of 2)]
[im 1/36]
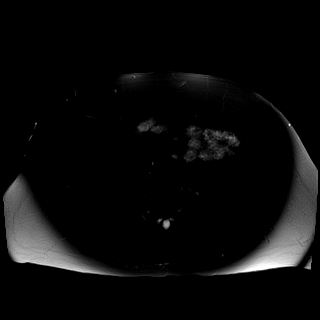
[im 36/36]
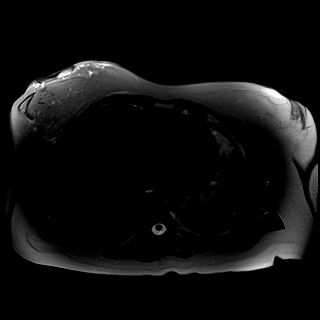

[Series 8: ax dwi_tracew · axial · 6.0mm · 1.42mm/px · z∈[-117,+135]mm · 4 of 108 slices shown]
[im 1/108]
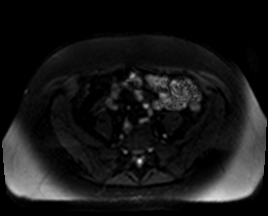
[im 36/108]
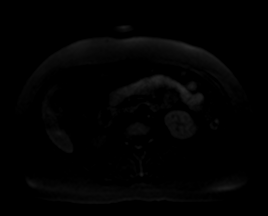
[im 72/108]
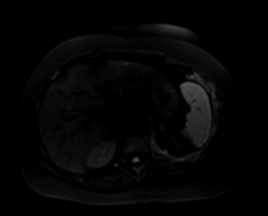
[im 108/108]
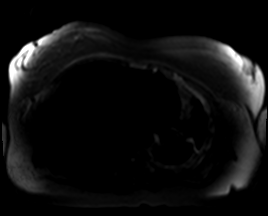

[Series 9: ax dwi_adc · axial · 6.0mm · 1.42mm/px · 1 of 36 slices shown]
[im 1/36]
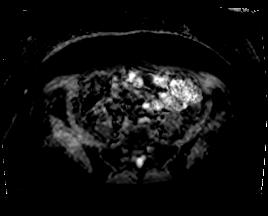

[Series 10: in & out · axial · 3.5mm · 1.19mm/px · z∈[-116,+133]mm · 3 of 72 slices shown (1 of 2)]
[im 1/72]
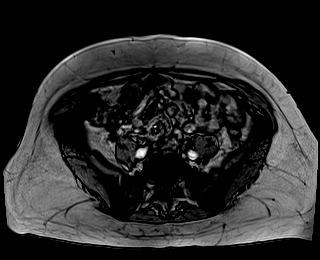
[im 36/72]
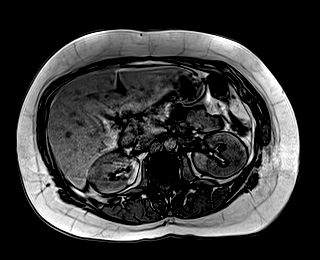
[im 72/72]
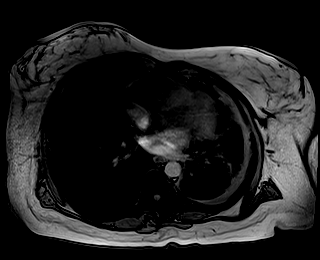

[Series 11: in & out · axial · 3.5mm · 1.19mm/px · z∈[-116,+133]mm · 3 of 72 slices shown (2 of 2)]
[im 1/72]
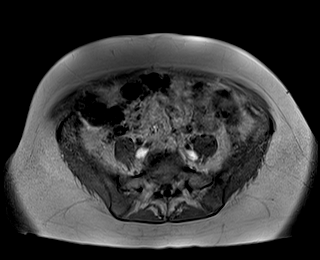
[im 36/72]
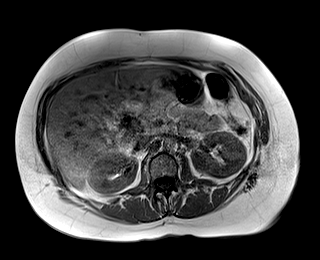
[im 72/72]
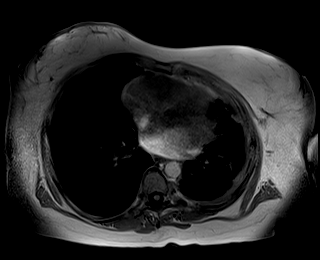

[Series 12: bSSFP · axial · 6.0mm · 0.74mm/px · 1 of 34 slices shown]
[im 1/34]
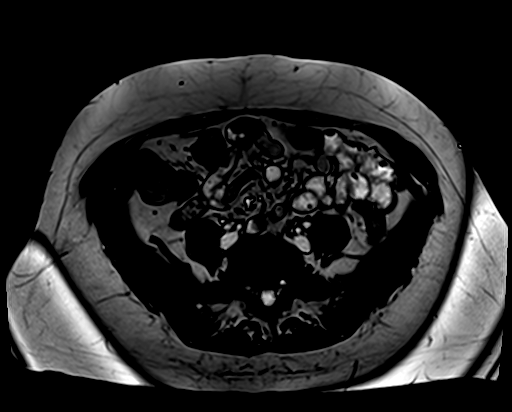

[Series 13: T1 dynamic fat-sat · axial · non-contrast · 3.5mm · 1.19mm/px · z∈[-130,+147]mm · 3 of 80 slices shown (1 of 5)]
[im 1/80]
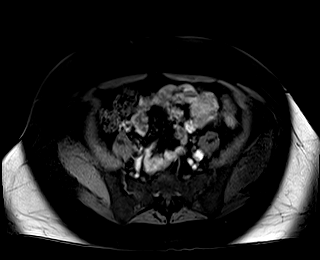
[im 40/80]
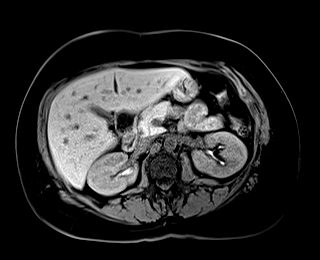
[im 80/80]
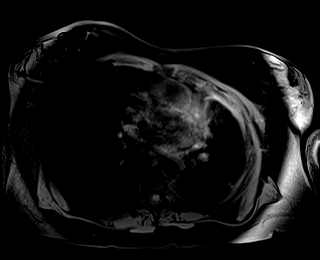

[Series 14: T1 dynamic fat-sat post-contrast · axial · 3.5mm · 1.19mm/px · z∈[-130,+147]mm · 3 of 80 slices shown (1 of 4)]
[im 1/80]
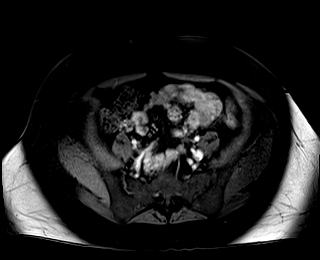
[im 40/80]
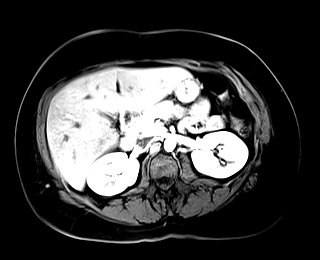
[im 80/80]
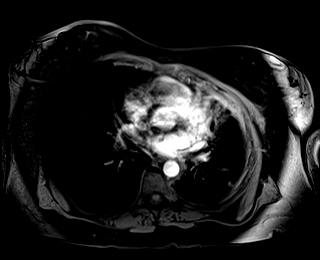

[Series 15: T1 dynamic fat-sat · axial · 3.5mm · 1.19mm/px · z∈[-130,+147]mm · 3 of 80 slices shown (2 of 5)]
[im 1/80]
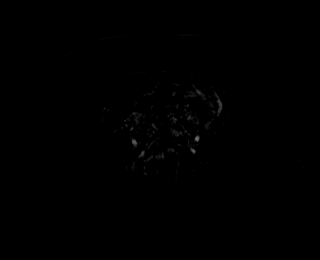
[im 40/80]
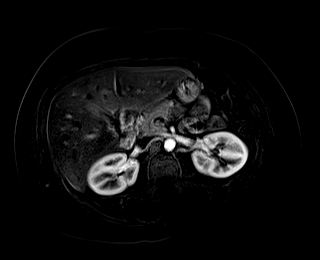
[im 80/80]
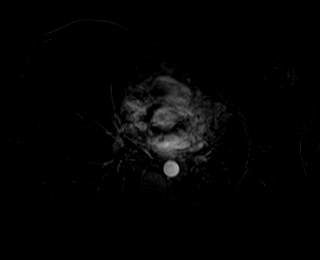

[Series 16: T1 dynamic fat-sat post-contrast · axial · 3.5mm · 1.19mm/px · z∈[-130,+147]mm · 3 of 80 slices shown (2 of 4)]
[im 1/80]
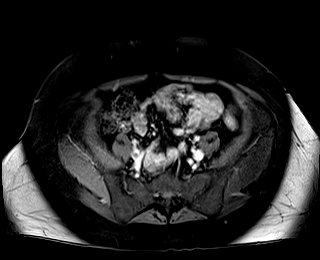
[im 40/80]
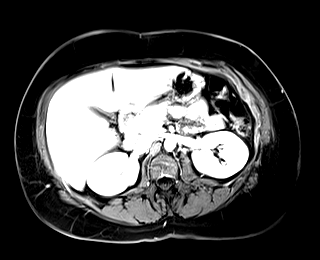
[im 80/80]
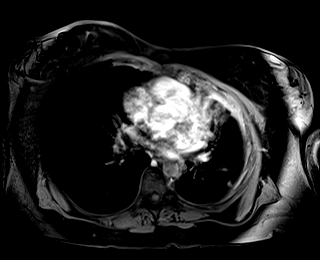

[Series 17: T1 dynamic fat-sat · axial · 3.5mm · 1.19mm/px · z∈[-130,+147]mm · 3 of 80 slices shown (3 of 5)]
[im 1/80]
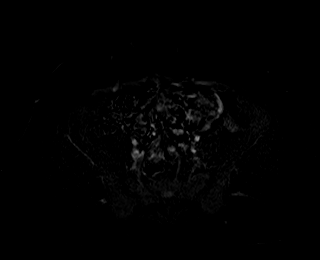
[im 40/80]
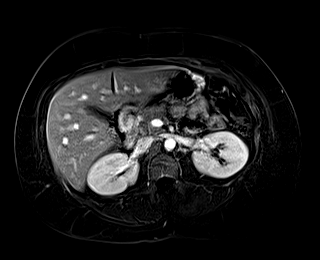
[im 80/80]
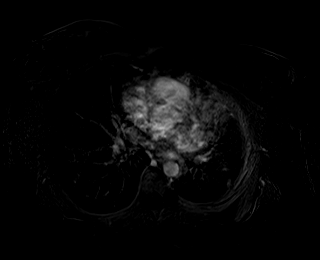

[Series 18: T1 dynamic fat-sat post-contrast · axial · 3.5mm · 1.19mm/px · z∈[-130,+147]mm · 3 of 80 slices shown (3 of 4)]
[im 1/80]
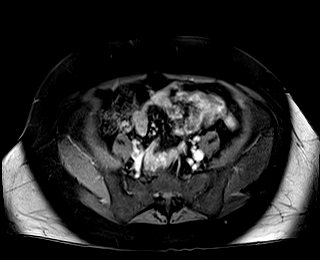
[im 40/80]
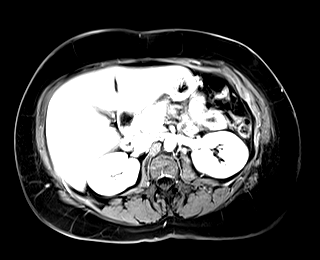
[im 80/80]
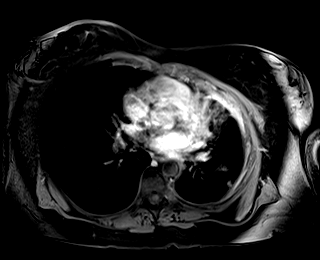

[Series 19: T1 dynamic fat-sat · axial · 3.5mm · 1.19mm/px · z∈[-130,+147]mm · 3 of 80 slices shown (4 of 5)]
[im 1/80]
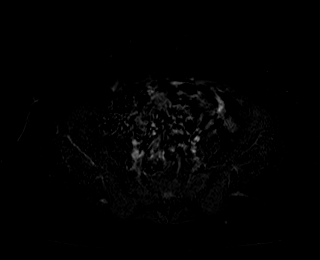
[im 40/80]
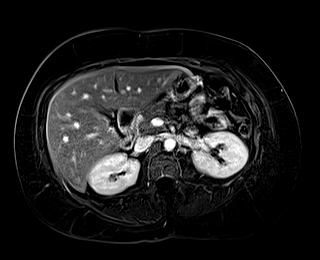
[im 80/80]
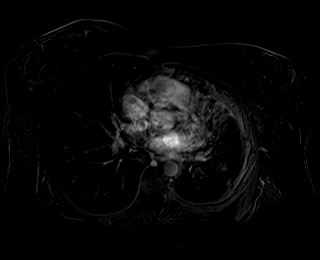

[Series 20: T1 dynamic post-contrast · coronal · 3.5mm · 1.31mm/px · 2 of 60 slices shown]
[im 1/60]
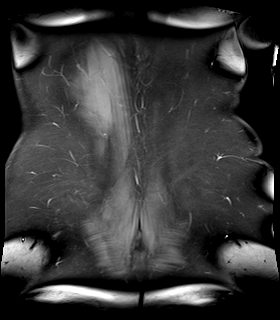
[im 60/60]
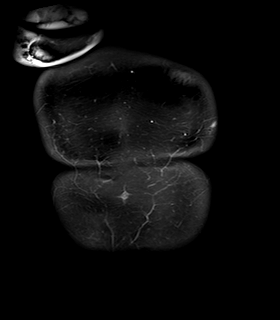

[Series 21: T1 dynamic fat-sat post-contrast · axial · 3.5mm · 1.19mm/px · z∈[-130,+147]mm · 3 of 80 slices shown (4 of 4)]
[im 1/80]
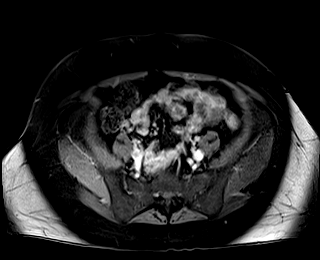
[im 40/80]
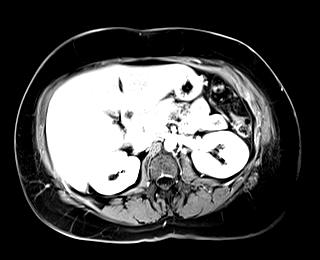
[im 80/80]
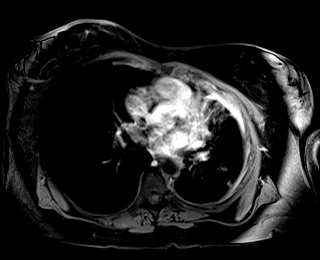

[Series 22: T1 dynamic fat-sat · axial · 3.5mm · 1.19mm/px · z∈[-130,+147]mm · 3 of 80 slices shown (5 of 5)]
[im 1/80]
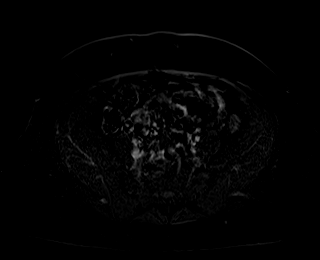
[im 40/80]
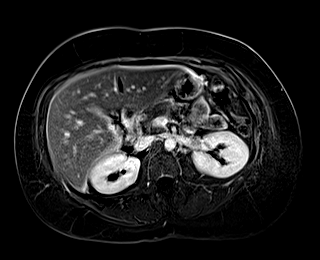
[im 80/80]
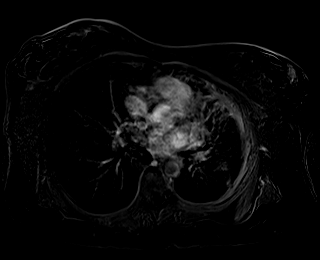

[Series 24: T2 fat-sat · axial · 6.0mm · 1.19mm/px · 1 of 36 slices shown (2 of 2)]
[im 1/36]
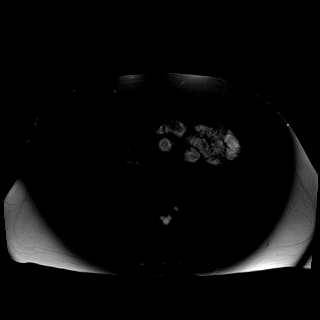

[48 of 48 positions shown; findings below may reference images not displayed]

FINDINGS: Lower chest: Left-sided pleural thickening and airspace disease are
suboptimally evaluated.

A right lower lobe pulmonary nodule of 5 mm has been evaluated on
prior CTs. Normal heart size.

Hepatobiliary: No cirrhosis. The 1.0 cm high right hepatic lobe
lesion on the prior CT is only readily apparent on post-contrast
image 25/16, is relatively occult on precontrast imaging, felt
unlikely to represent a metastasis.

However, there are multiple tiny lesions which have imaging
characteristics suspicious for metastasis. These are identified on
T2 and diffusion-weighted imaging. Examples include within the
anterior left hepatic lobe (corresponding to the 7 mm CT
lobe on 82/8, and anterior right hepatic lobe on 84/8. Probable
concurrent decreased signal on ADC map involving some of these
lesions, including on images 10 and 13 of series 9. After contrast,
some of these lesions are subtly apparent. Example in the hepatic
dome at 6 mm on 24/21 and within the central right hepatic lobe at
3-4 mm on 31/21.

Normal gallbladder, without biliary ductal dilatation.

Pancreas:  Normal, without mass or ductal dilatation.

Spleen:  Normal in size, without focal abnormality.

Adrenals/Urinary Tract: Normal adrenal glands. Normal kidneys,
without hydronephrosis.

Stomach/Bowel: Tiny hiatal hernia. Large colonic stool burden.
Normal small bowel caliber.

Vascular/Lymphatic: Normal caliber of the aorta and branch vessels.
No retroperitoneal or retrocrural adenopathy.

Other:  No abdominal ascites or peritoneal metastasis.

Musculoskeletal: Osseous metastasis, including a left-sided lower
thoracic enhancing lesion at 8 mm on [DATE].
IMPRESSION: 1. Multiple tiny liver lesions which are suspicious for low volume
hepatic metastasis. Without prior MRIs for direct comparison, these
are of indeterminate acuity.
2. Osseous metastasis, as on recent PET.

## 2020-12-12 MED ORDER — GADOBUTROL 1 MMOL/ML IV SOLN
7.0000 mL | Freq: Once | INTRAVENOUS | Status: AC | PRN
  Administered 2020-12-12: 7 mL via INTRAVENOUS

## 2020-12-13 ENCOUNTER — Ambulatory Visit
Admission: RE | Admit: 2020-12-13 | Discharge: 2020-12-13 | Disposition: A | Discharge status: Home / Self Care | Payer: BC Managed Care – PPO | Source: Ambulatory Visit | Attending: Oncology | Admitting: Oncology

## 2020-12-13 ENCOUNTER — Ambulatory Visit
Admission: RE | Admit: 2020-12-13 | Discharge: 2020-12-13 | Disposition: A | Discharge status: Home / Self Care | Payer: BC Managed Care – PPO | Source: Home / Self Care | Attending: Oncology | Admitting: Oncology

## 2020-12-13 ENCOUNTER — Other Ambulatory Visit: Payer: Self-pay | Admitting: Emergency Medicine

## 2020-12-13 DIAGNOSIS — C50411 Malignant neoplasm of upper-outer quadrant of right female breast: Secondary | ICD-10-CM | POA: Diagnosis present

## 2020-12-13 DIAGNOSIS — M79641 Pain in right hand: Secondary | ICD-10-CM

## 2020-12-13 DIAGNOSIS — Z170 Estrogen receptor positive status [ER+]: Secondary | ICD-10-CM | POA: Insufficient documentation

## 2020-12-13 IMAGING — MR MR THORACIC SPINE WO/W CM
6 of 9 series · 28 of 48 positions shown · IV contrast (gadavist)
Comparison: Prior PET-CT from [DATE].

CLINICAL DATA: Initial evaluation for back pain, abnormal scan.

EXAM:
MRI THORACIC WITHOUT AND WITH CONTRAST
TECHNIQUE: Multiplanar and multiecho pulse sequences of the thoracic spine were
obtained without and with intravenous contrast.
CONTRAST:  7mL GADAVIST GADOBUTROL 1 MMOL/ML IV SOLN

[Series 16: T1 · sagittal · 5.0mm · 1.41mm/px · 3 of 9 slices shown (1 of 3)]
[im 1/9]
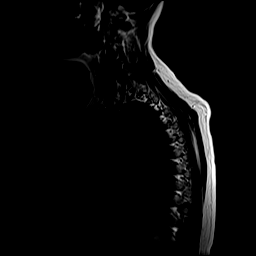
[im 5/9]
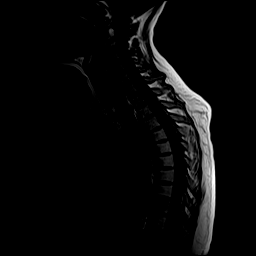
[im 9/9]
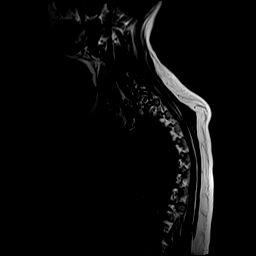

[Series 17: T2 · sagittal · 3.0mm · 1.06mm/px · 4 of 17 slices shown (1 of 2)]
[im 1/17]
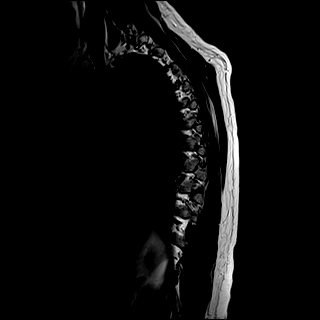
[im 6/17]
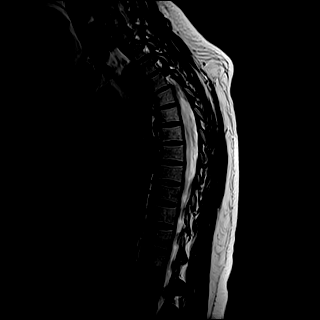
[im 11/17]
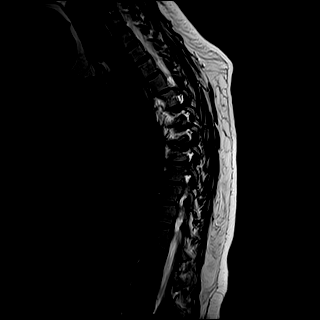
[im 17/17]
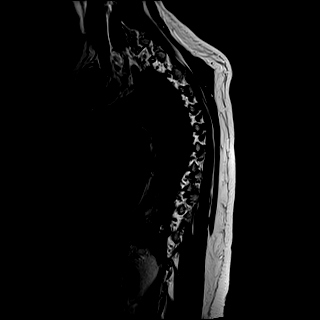

[Series 18: T1 · sagittal · 3.0mm · 1.06mm/px · 3 of 17 slices shown (2 of 3)]
[im 1/17]
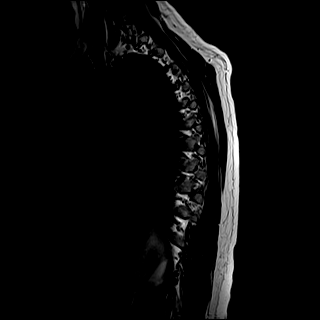
[im 9/17]
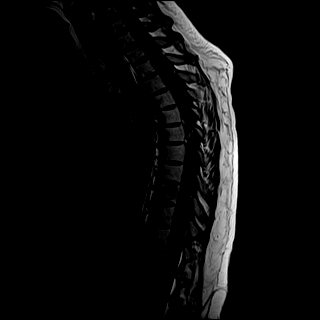
[im 17/17]
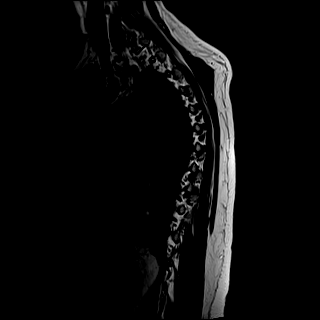

[Series 21: T2 · axial · 4.0mm · 0.59mm/px · z∈[-223,-28]mm · 8 of 40 slices shown (2 of 2)]
[im 1/40]
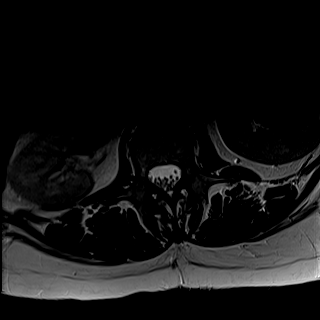
[im 6/40]
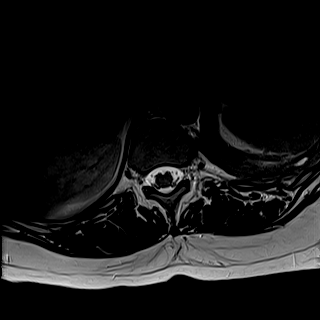
[im 12/40]
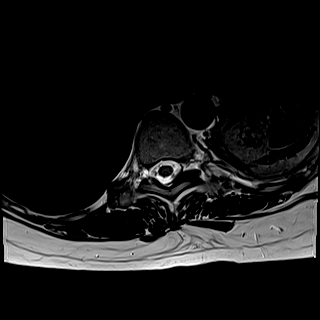
[im 17/40]
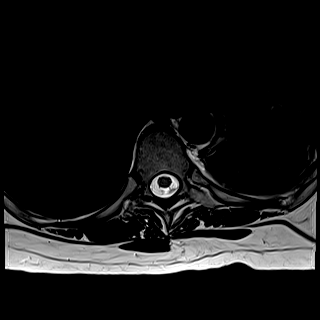
[im 23/40]
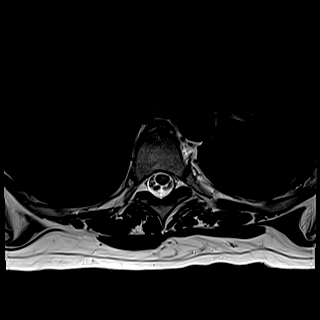
[im 28/40]
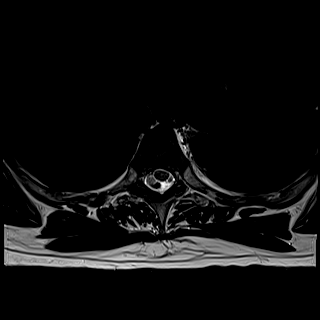
[im 34/40]
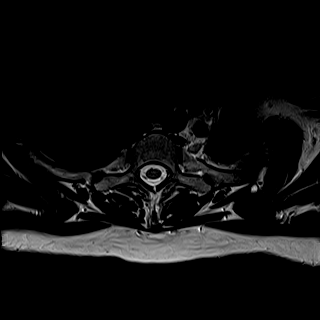
[im 40/40]
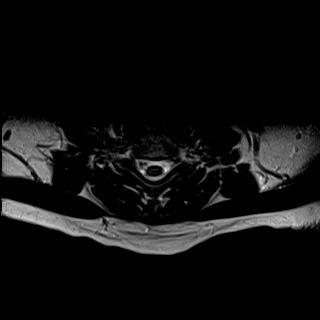

[Series 22: T1 · axial · non-contrast · 4.0mm · 0.31mm/px · z∈[-223,-28]mm · 8 of 40 slices shown (3 of 3)]
[im 1/40]
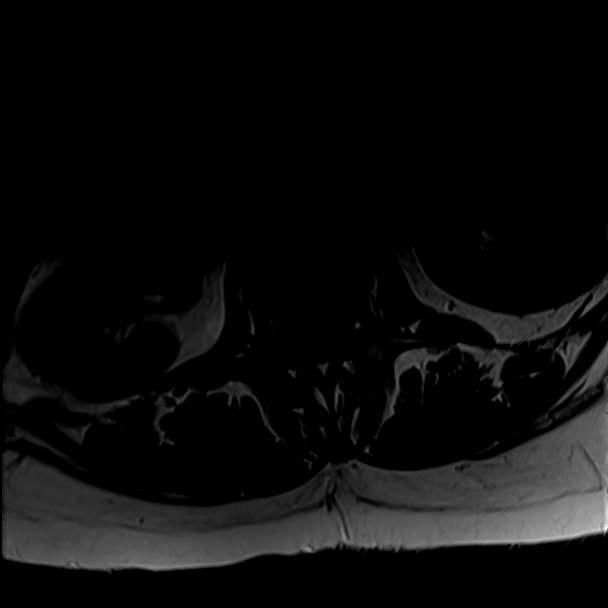
[im 6/40]
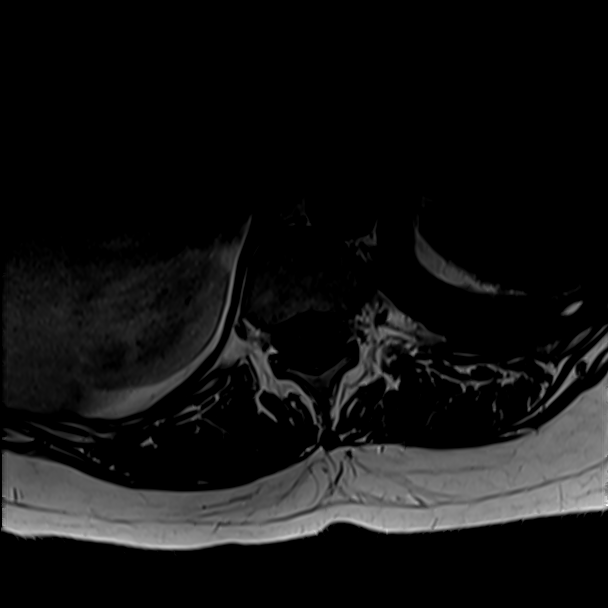
[im 12/40]
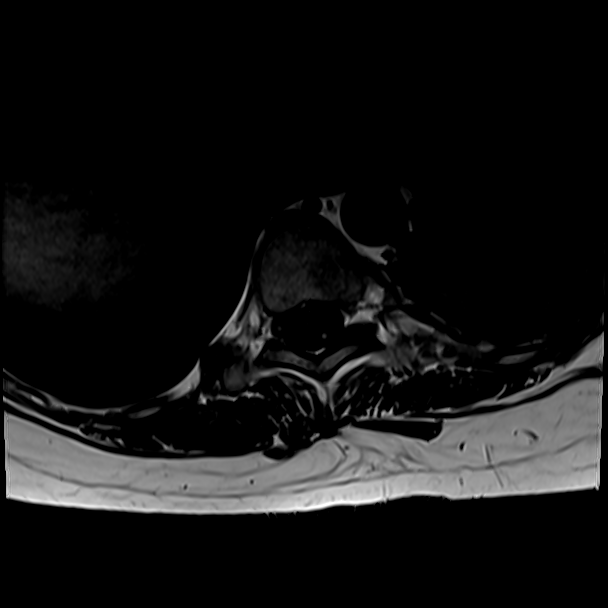
[im 17/40]
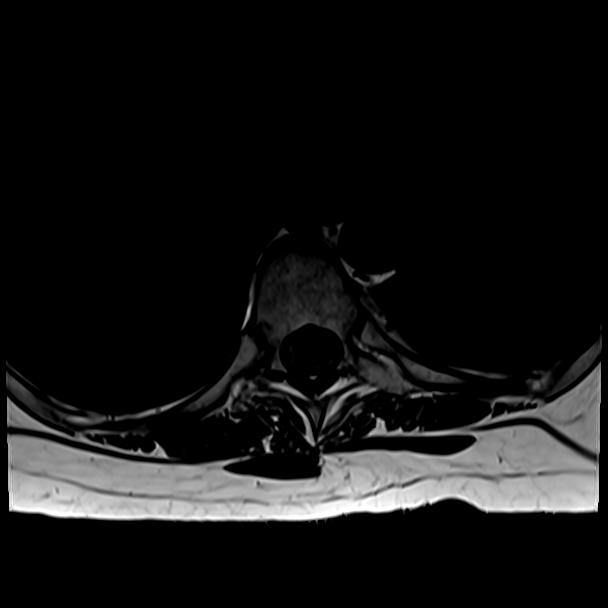
[im 23/40]
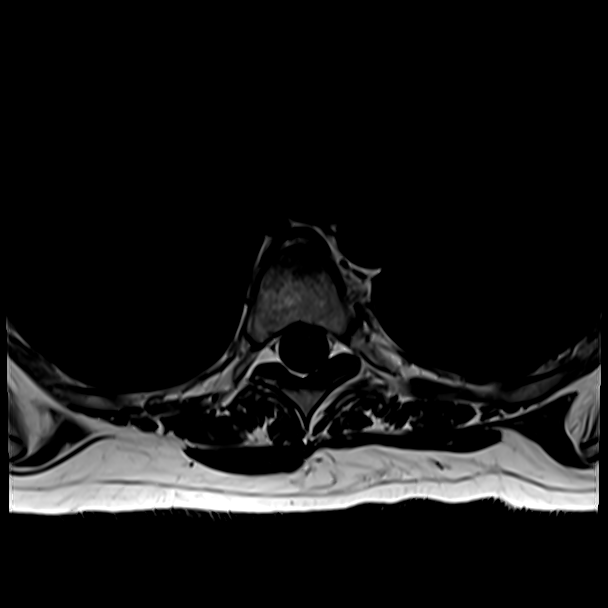
[im 28/40]
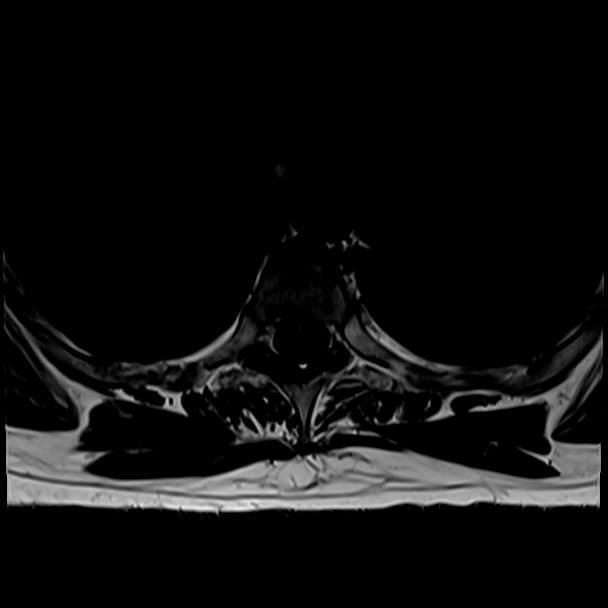
[im 34/40]
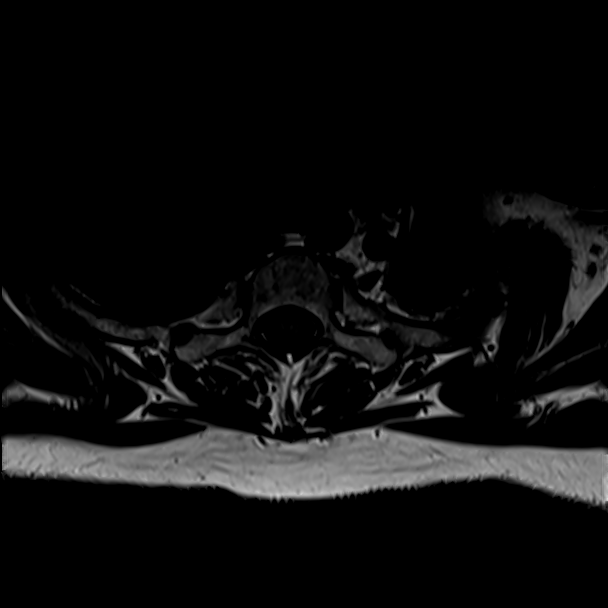
[im 40/40]
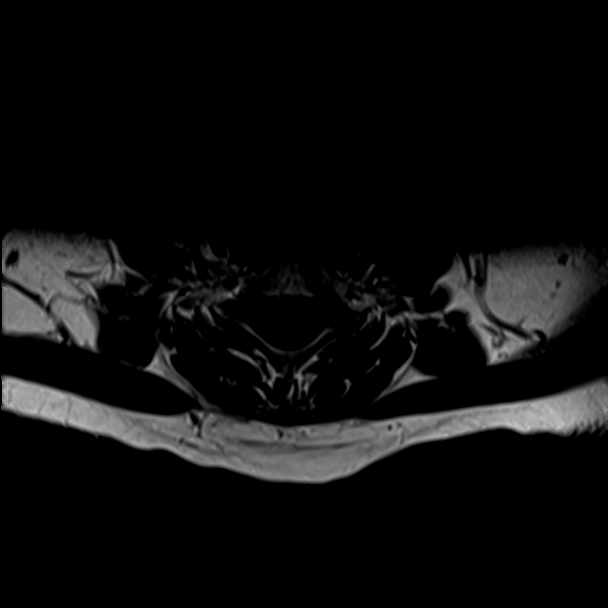

[Series 24: T1 fat-sat post-contrast · sagittal · 3.0mm · 1.06mm/px · 2 of 17 slices shown]
[im 1/17]
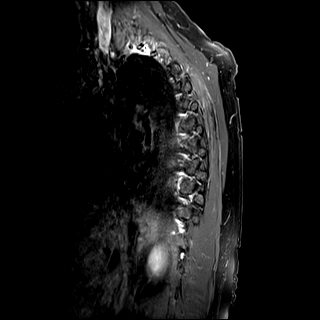
[im 9/17]
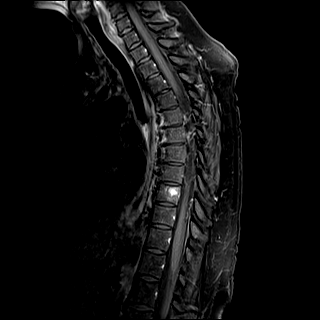

[28 of 48 positions shown; findings below may reference images not displayed]

FINDINGS: Alignment: Mild roto scoliosis. Alignment otherwise normal
preservation of the normal thoracic kyphosis. No listhesis.

Vertebrae: 1 cm T1 hypointense enhancing lesion seen within the left
aspect of the T9 vertebral body, consistent with an osseous
metastasis (series 24, image 10). No extra osseous extension,
pathologic fracture, or other complication. No other evidence for
metastatic disease within the thoracic spine. Underlying bone marrow
signal intensity within normal limits. Mild discogenic reactive
endplate change noted about the T5-6 and T7-8 interspaces anteriorly
due to scoliosis. No other abnormal marrow edema or enhancement.

Cord: Normal signal and morphology. No abnormal enhancement. No
epidural tumor.

Paraspinal and other soft tissues: Paraspinous soft tissues within
normal limits. Irregular pleuroparenchymal thickening with adjacent
architectural distortion within the visualized left lung, similar to
prior exams, incompletely assessed on this study.

Disc levels:

Mild reactive endplate change about the T5-6 and T6-7 interspaces
anteriorly. Otherwise, no other significant disc pathology seen
within the thoracic spine. No disc bulge or focal disc herniation.
No significant canal or foraminal stenosis or evidence for neural
impingement.
IMPRESSION: 1. 1 cm osseous metastasis within the left aspect of the T9
vertebral body. No extra osseous extension, pathologic fracture, or
other complication.
2. No other evidence for metastatic disease within the thoracic
spine.
3. Irregular pleuroparenchymal thickening with adjacent
architectural distortion within the visualized left lung, similar to
prior exams.

## 2020-12-13 MED ORDER — GADOBUTROL 1 MMOL/ML IV SOLN
7.0000 mL | Freq: Once | INTRAVENOUS | Status: AC | PRN
  Administered 2020-12-13: 7 mL via INTRAVENOUS

## 2020-12-14 ENCOUNTER — Other Ambulatory Visit: Payer: Self-pay

## 2020-12-14 ENCOUNTER — Emergency Department: Payer: BC Managed Care – PPO

## 2020-12-14 ENCOUNTER — Telehealth: Payer: Self-pay | Admitting: Oncology

## 2020-12-14 ENCOUNTER — Emergency Department
Admission: EM | Admit: 2020-12-14 | Discharge: 2020-12-14 | Disposition: A | Discharge status: Home / Self Care | Payer: BC Managed Care – PPO | Attending: Emergency Medicine | Admitting: Emergency Medicine

## 2020-12-14 DIAGNOSIS — Z853 Personal history of malignant neoplasm of breast: Secondary | ICD-10-CM | POA: Diagnosis not present

## 2020-12-14 DIAGNOSIS — M25572 Pain in left ankle and joints of left foot: Secondary | ICD-10-CM | POA: Insufficient documentation

## 2020-12-14 DIAGNOSIS — M25472 Effusion, left ankle: Secondary | ICD-10-CM | POA: Insufficient documentation

## 2020-12-14 DIAGNOSIS — Z87891 Personal history of nicotine dependence: Secondary | ICD-10-CM | POA: Insufficient documentation

## 2020-12-14 DIAGNOSIS — I10 Essential (primary) hypertension: Secondary | ICD-10-CM | POA: Insufficient documentation

## 2020-12-14 LAB — CBC WITH DIFFERENTIAL/PLATELET
Abs Immature Granulocytes: 0.02 10*3/uL (ref 0.00–0.07)
Basophils Absolute: 0.1 10*3/uL (ref 0.0–0.1)
Basophils Relative: 2 %
Eosinophils Absolute: 0.2 10*3/uL (ref 0.0–0.5)
Eosinophils Relative: 3 %
HCT: 36.5 % (ref 36.0–46.0)
Hemoglobin: 12.5 g/dL (ref 12.0–15.0)
Immature Granulocytes: 0 %
Lymphocytes Relative: 20 %
Lymphs Abs: 1.2 10*3/uL (ref 0.7–4.0)
MCH: 33.9 pg (ref 26.0–34.0)
MCHC: 34.2 g/dL (ref 30.0–36.0)
MCV: 98.9 fL (ref 80.0–100.0)
Monocytes Absolute: 0.4 10*3/uL (ref 0.1–1.0)
Monocytes Relative: 6 %
Neutro Abs: 4.1 10*3/uL (ref 1.7–7.7)
Neutrophils Relative %: 69 %
Platelets: 490 10*3/uL — ABNORMAL HIGH (ref 150–400)
RBC: 3.69 MIL/uL — ABNORMAL LOW (ref 3.87–5.11)
RDW: 14.1 % (ref 11.5–15.5)
WBC: 5.9 10*3/uL (ref 4.0–10.5)
nRBC: 0 % (ref 0.0–0.2)

## 2020-12-14 LAB — BASIC METABOLIC PANEL
Anion gap: 7 (ref 5–15)
BUN: 15 mg/dL (ref 6–20)
CO2: 29 mmol/L (ref 22–32)
Calcium: 9.2 mg/dL (ref 8.9–10.3)
Chloride: 100 mmol/L (ref 98–111)
Creatinine, Ser: 0.59 mg/dL (ref 0.44–1.00)
GFR, Estimated: >60 mL/min (ref >60)
Glucose, Bld: 104 mg/dL — ABNORMAL HIGH (ref 70–99)
Potassium: 4.2 mmol/L (ref 3.5–5.1)
Sodium: 136 mmol/L (ref 135–145)

## 2020-12-14 IMAGING — DX DG ANKLE COMPLETE 3+V*L*
3 series · 3 of 3 positions shown · non-contrast
Comparison: None.

CLINICAL DATA: Left ankle pain, swelling

EXAM:
LEFT ANKLE COMPLETE - 3+ VIEW

[ankle ap]
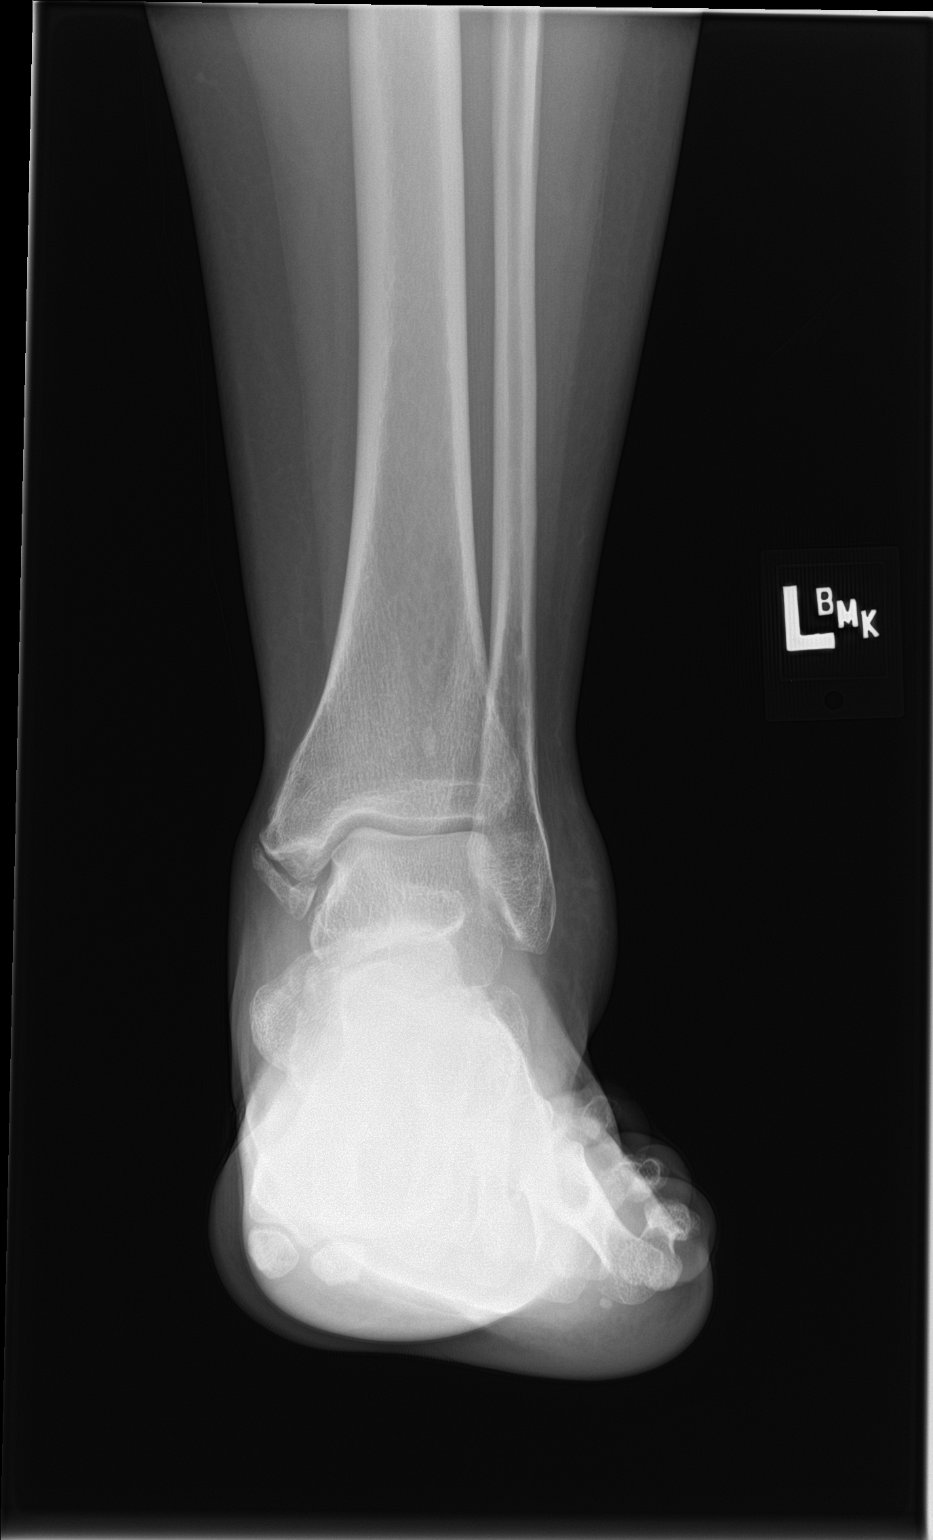

[ankle obl]
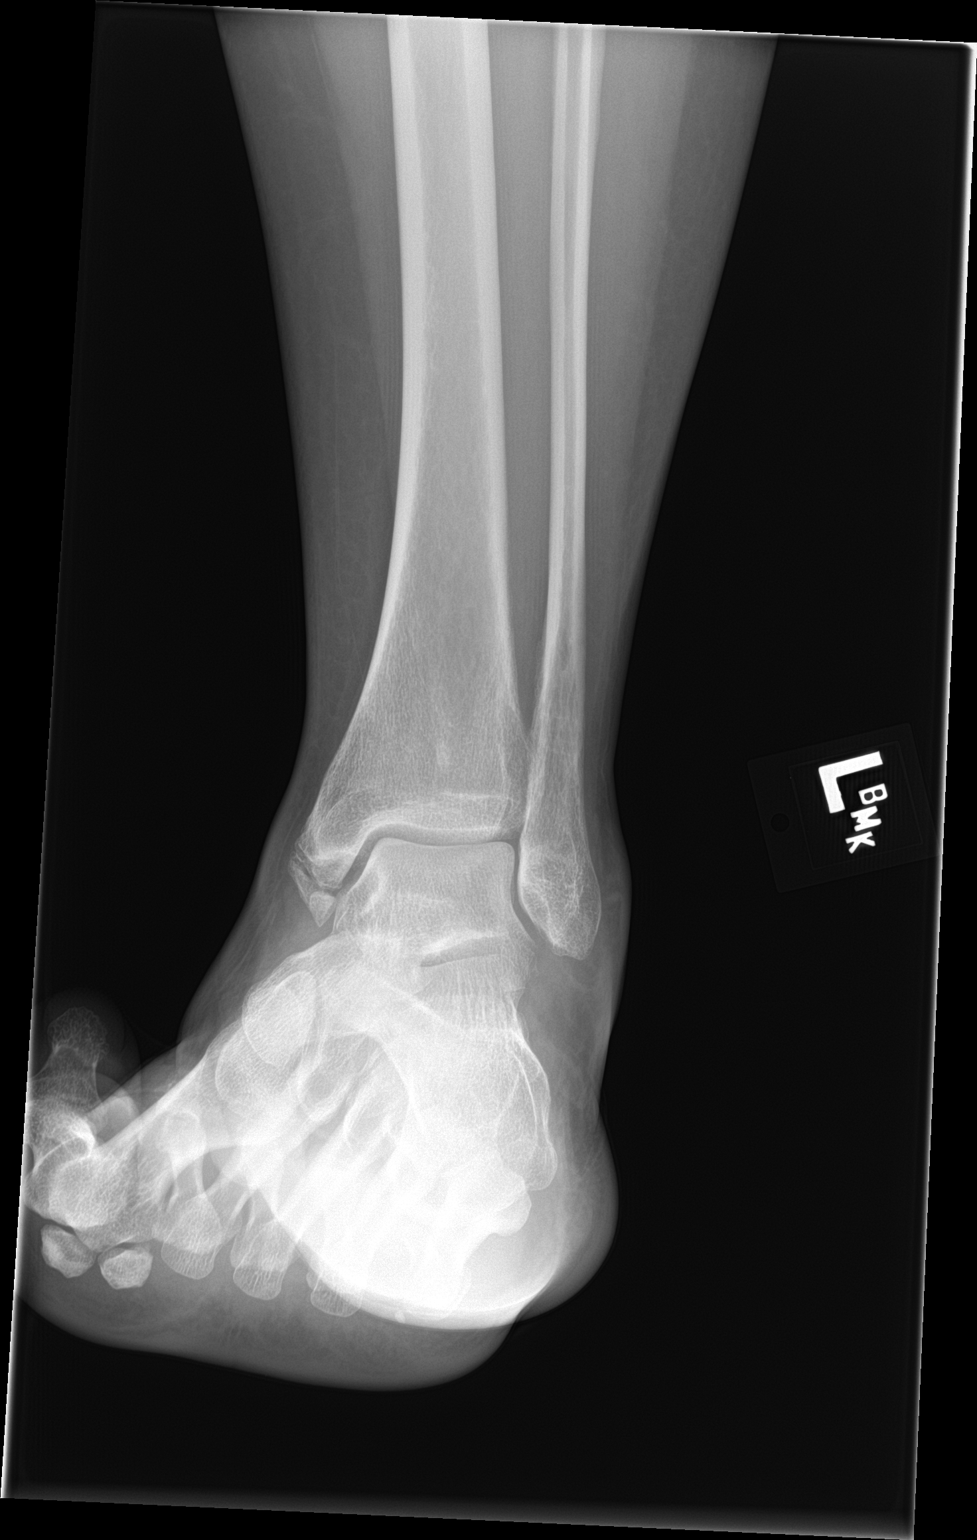

[ankle lat]
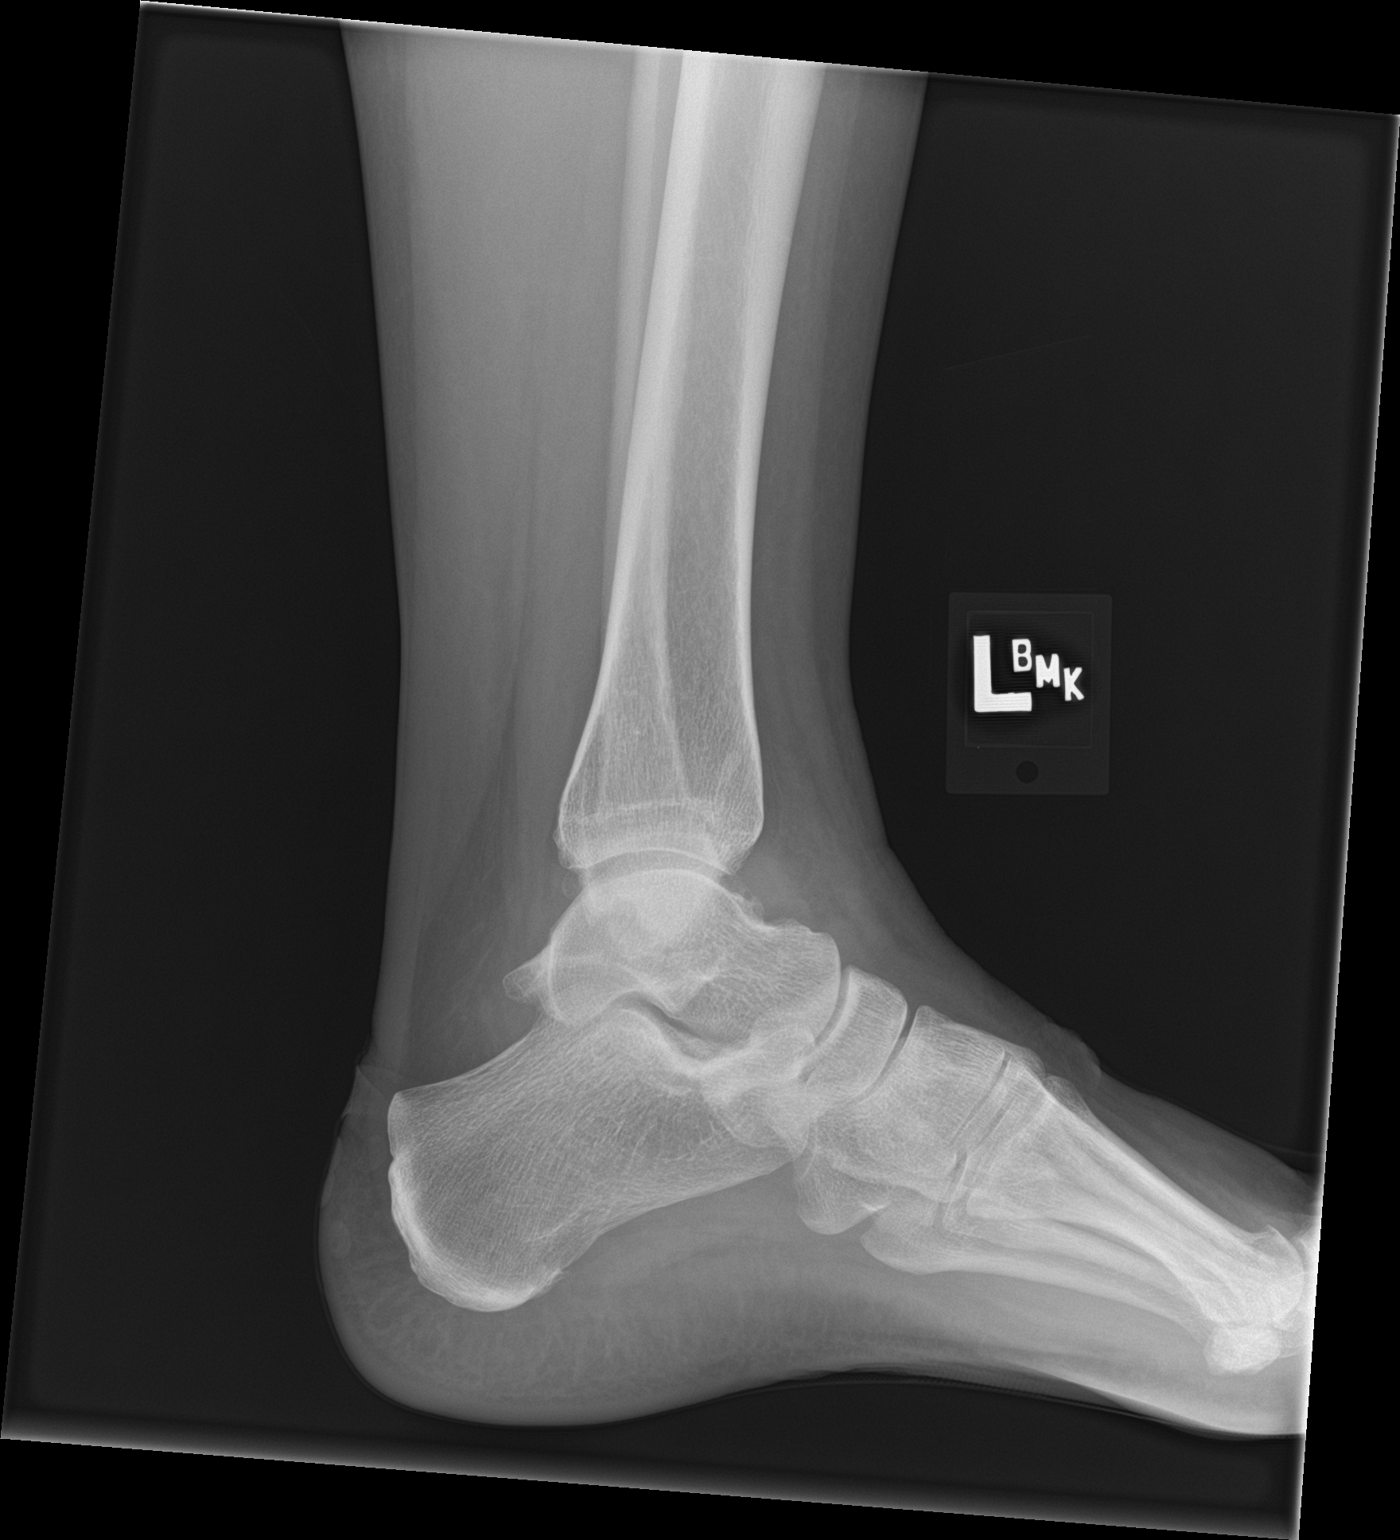

[3 of 3 positions shown; findings below may reference images not displayed]

FINDINGS: Lateral soft tissue swelling. Chronic appearing bone fragments
adjacent to the medial malleolus. No acute fracture, subluxation or
dislocation. Soft tissues are intact.
IMPRESSION: Bone fragments adjacent to the medial malleolus, likely chronic/old
fractures.

No acute bony abnormality.

## 2020-12-14 IMAGING — US US EXTREM LOW VENOUS*L*
1 series · 14 of 24 positions shown · non-contrast
Comparison: None.

CLINICAL DATA: Left ankle swelling, pain

EXAM:
LEFT LOWER EXTREMITY VENOUS DOPPLER ULTRASOUND
TECHNIQUE: Gray-scale sonography with compression, as well as color and duplex
ultrasound, were performed to evaluate the deep venous system(s)
from the level of the common femoral vein through the popliteal and
proximal calf veins.

[Series 1: us venous img lower uni left (dvt) · portal-venous · 14 of 33 slices shown]
[im 1/33]
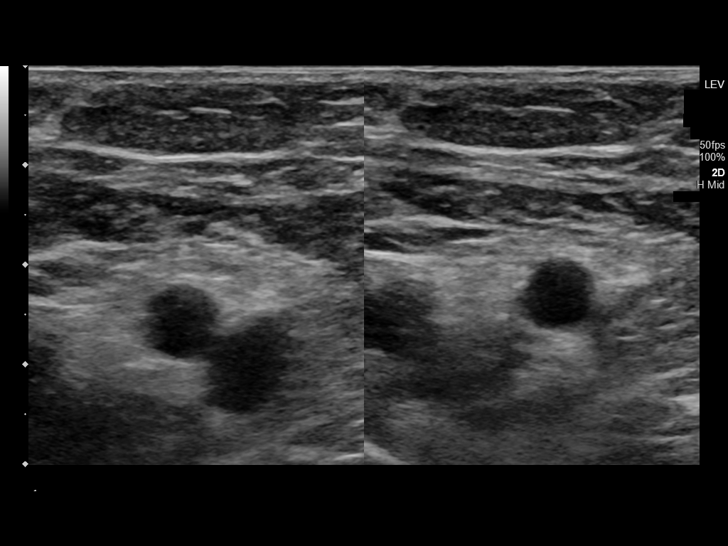
[im 3/33]
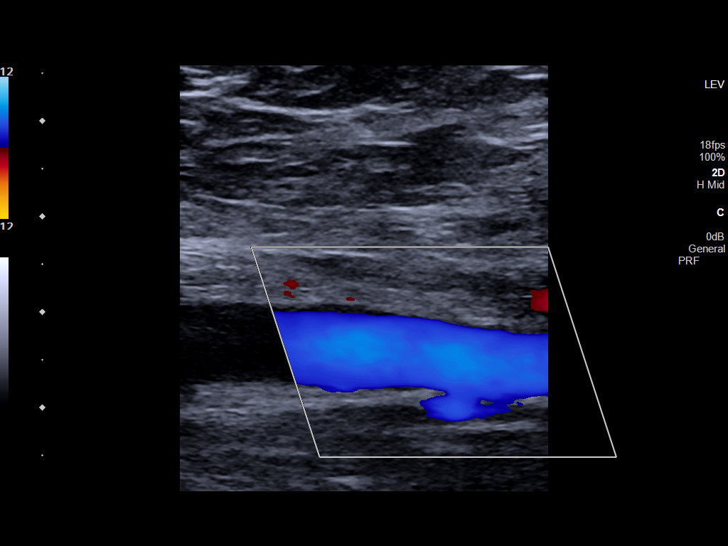
[im 6/33]
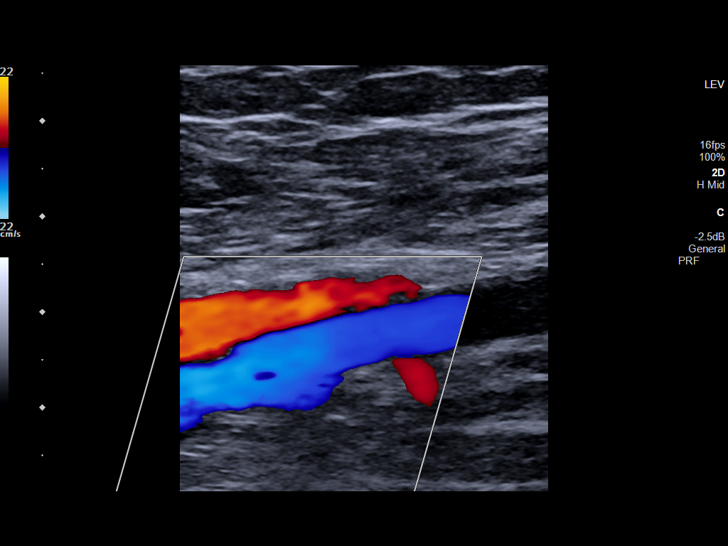
[im 9/33]
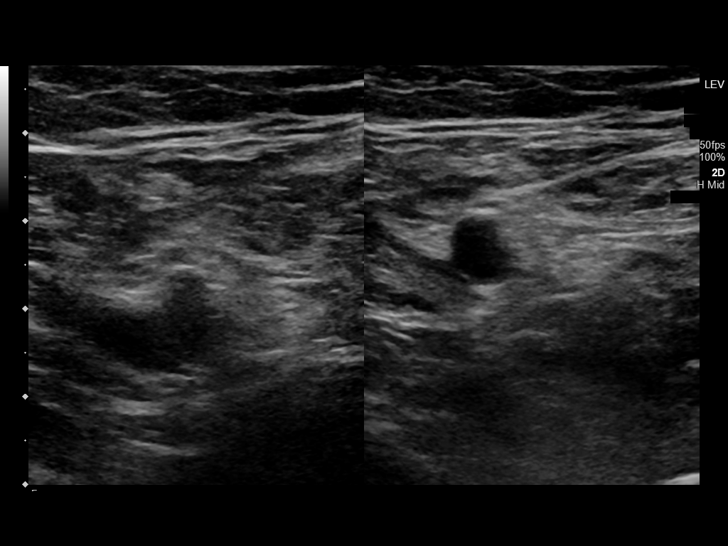
[im 10/33]
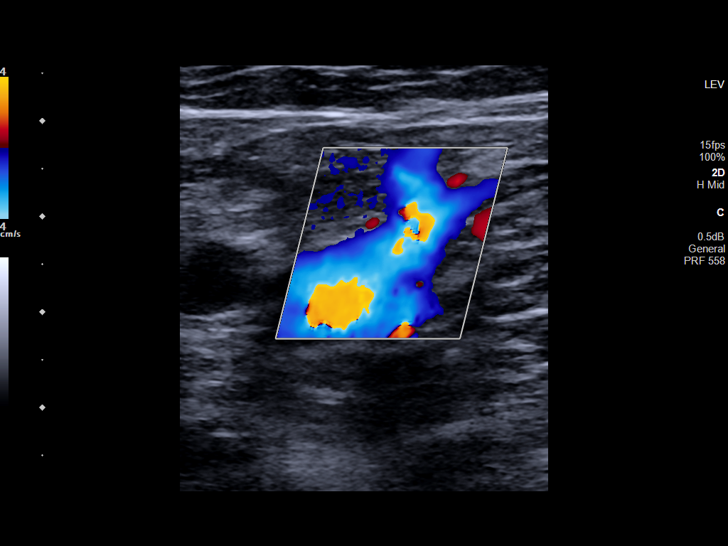
[im 13/33]
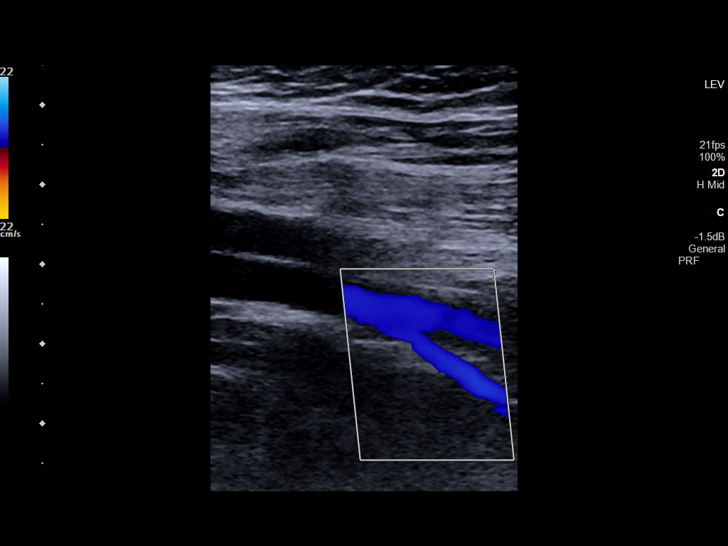
[im 16/33]
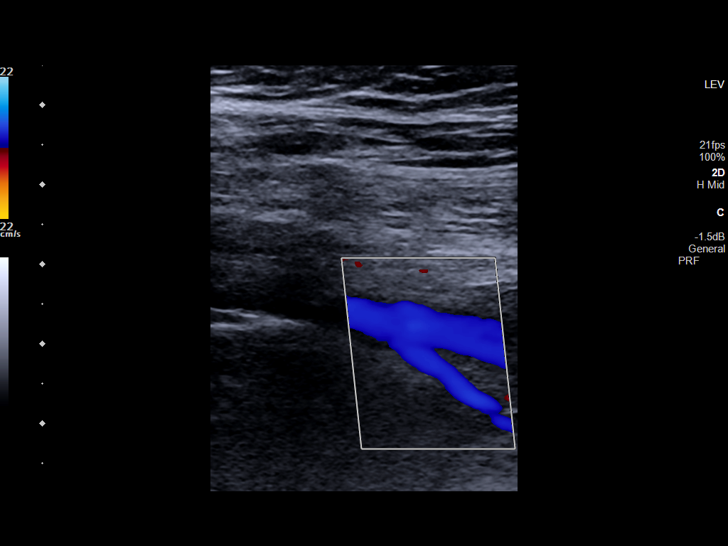
[im 17/33]
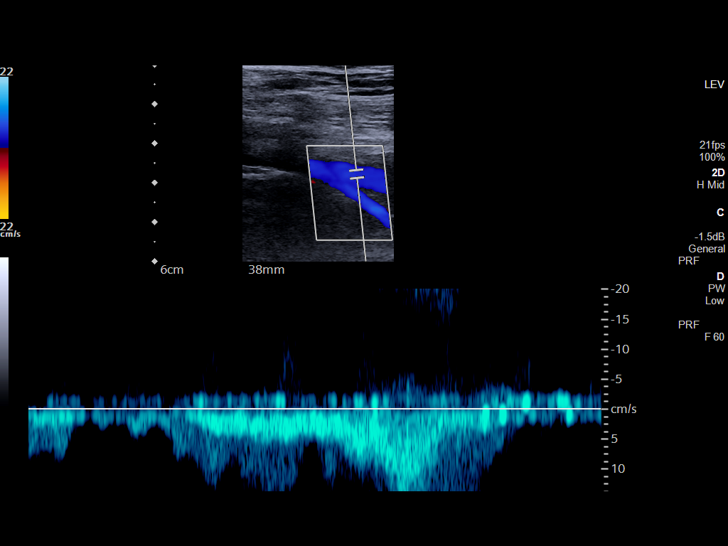
[im 20/33]
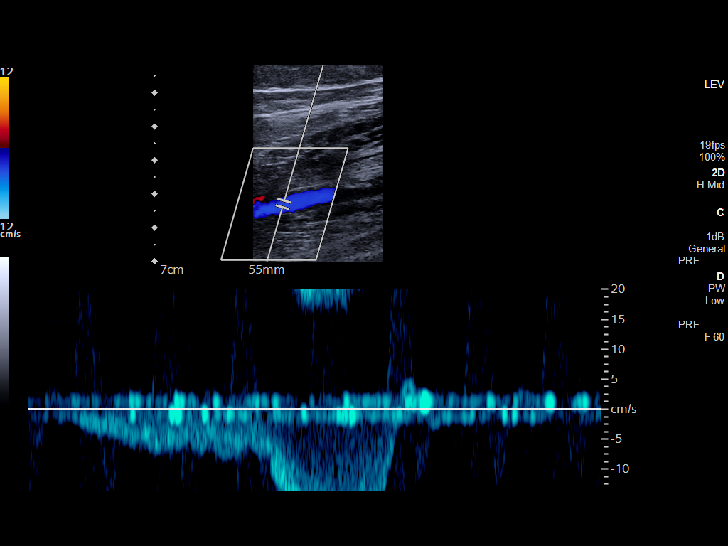
[im 23/33]
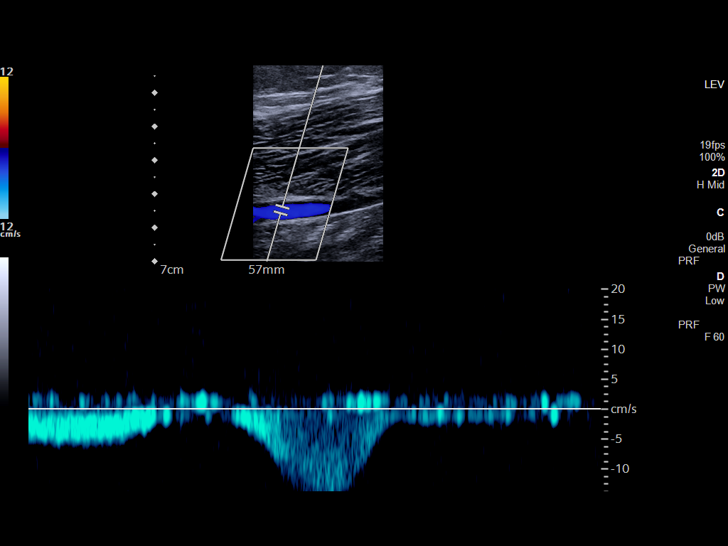
[im 26/33]
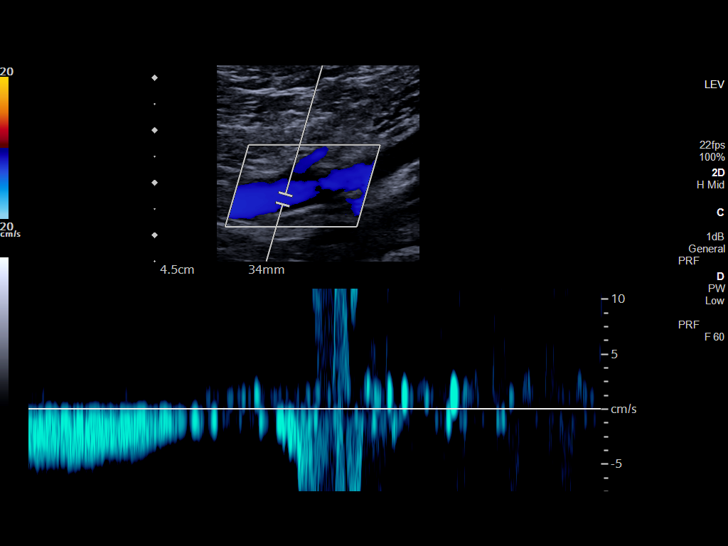
[im 27/33]
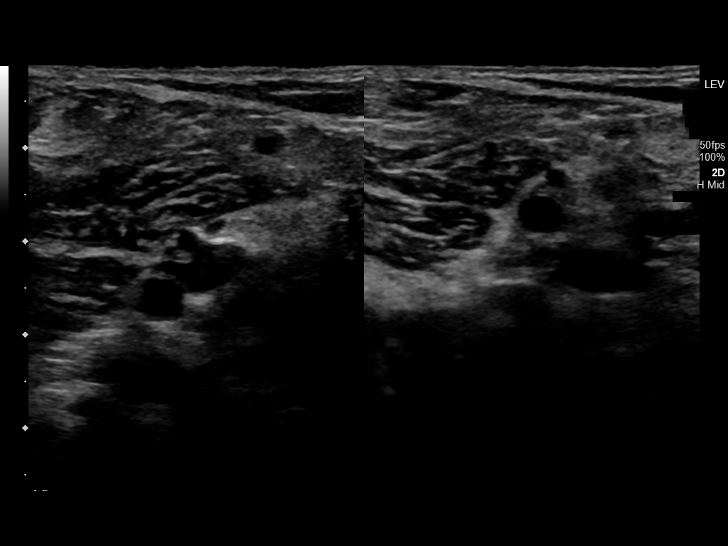
[im 30/33]
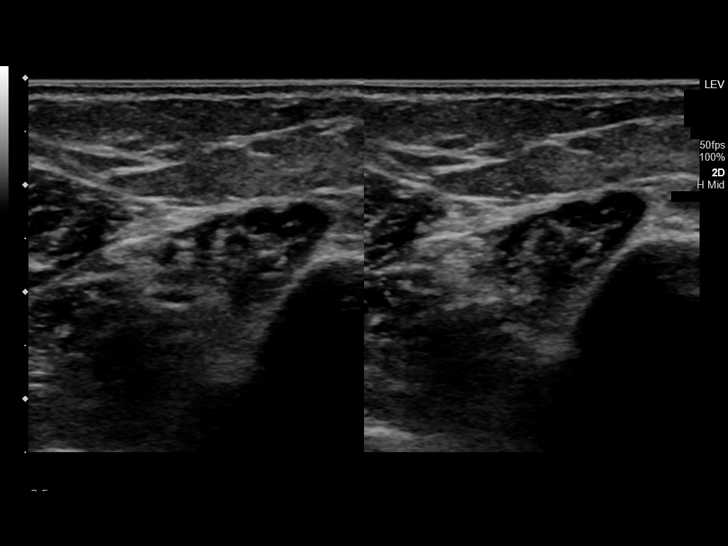
[im 33/33]
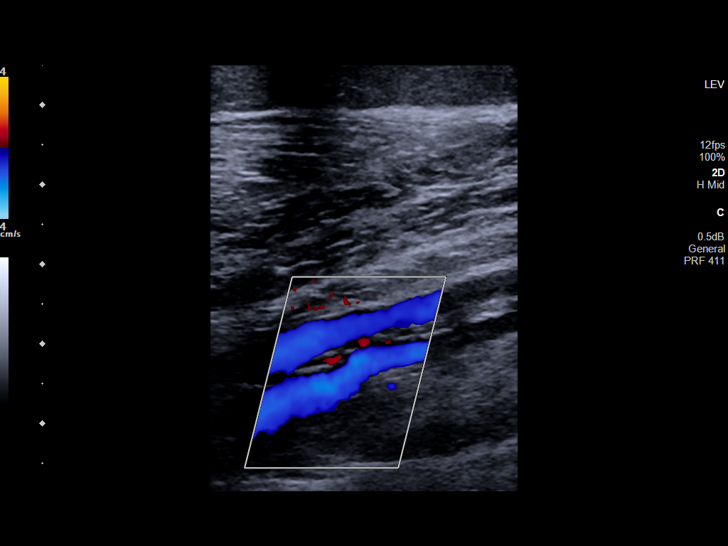

[14 of 24 positions shown; findings below may reference images not displayed]

FINDINGS: VENOUS

Normal compressibility of the common femoral, superficial femoral,
and popliteal veins, as well as the visualized calf veins.
Visualized portions of profunda femoral vein and great saphenous
vein unremarkable. No filling defects to suggest DVT on grayscale or
color Doppler imaging. Doppler waveforms show normal direction of
venous flow, normal respiratory plasticity and response to
augmentation.

Limited views of the contralateral common femoral vein are
unremarkable.

OTHER

None.

Limitations: none
IMPRESSION: Negative.

## 2020-12-14 MED ORDER — OXYCODONE HCL 5 MG PO TABS: 5.0000 mg | ORAL_TABLET | Freq: Four times a day (QID) | ORAL | 0 refills | Status: DC | PRN

## 2020-12-14 MED ORDER — OXYCODONE HCL 5 MG PO TABS
5.0000 mg | ORAL_TABLET | ORAL | Status: AC
  Administered 2020-12-14: 5 mg via ORAL
  Filled 2020-12-14: qty 1

## 2020-12-14 MED ORDER — ACETAMINOPHEN 500 MG PO TABS
1000.0000 mg | ORAL_TABLET | Freq: Once | ORAL | Status: AC
  Administered 2020-12-14: 1000 mg via ORAL
  Filled 2020-12-14: qty 2

## 2020-12-14 NOTE — Telephone Encounter (Signed)
Patient called and report left ankle swelling since this morning. No prior injury.  I recommend patient go to ER or urgent care to get evaluation. Need to rule out DVT. Patient voices understanding of my recommendation Cc patient's oncologist Dr.Finnegan

## 2020-12-14 NOTE — ED Provider Notes (Signed)
[name] Emergency Department Provider Note  ____________________________________________  Time seen: Approximately 6:33 PM  I have reviewed the triage vital signs and the nursing notes.   HISTORY  Chief Complaint Joint Swelling    HPI Kaylee Romero is a 40 y.o. female with a history of breast cancer, hypertension who comes ED complaining of left ankle pain that she noticed this morning with waking up.  It is associated with ankle swelling.  Worse to walk on, no alleviating factors, nonradiating.  Denies any trauma or falls.  No recent heavy exertion or strenuous exercise.  She is on oral chemotherapy.  She also had a zoledronic acid infusion for the first time 1 week ago.  No chest pain or shortness of breath.    Past Medical History:  Diagnosis Date   Anxiety    Family history of breast cancer    HSV (herpes simplex virus) anogenital infection    positive titer only     Patient Active Problem List   Diagnosis Date Noted   External hemorrhoids 11/11/2020   Recurrent breast cancer, right ([place]) 11/11/2020   Visit for wound check 09/10/2020   Chest wall abscess 08/13/2020   Sepsis ([place]) 08/09/2020   Pleural effusion on left 07/29/2020   Pulmonary nodule seen on imaging study 07/01/2020   GAD (generalized anxiety disorder) 04/01/2020   Essential hypertension 03/06/2020   History of breast cancer 03/06/2020   Hx of breast surgery 02/28/2019   Port-A-Cath in place 02/02/2019   Genetic testing 10/13/2018   Malignant neoplasm of upper-outer quadrant of right breast in female, estrogen receptor positive ([place]) 09/29/2018   Pregnancy 06/18/2017   Low back pain 01/14/2013   ADD (attention deficit disorder) 03/24/2012   Overweight (BMI 25.0-29.9) 01/14/2012     Past Surgical History:  Procedure Laterality Date   BREAST LUMPECTOMY WITH AXILLARY LYMPH NODE BIOPSY Right 11/21/2018   Procedure: RIGHT BREAST LUMPECTOMY WITH SENTINEL LYMPH NODE  BIOPSY;  Surgeon: Jovita Kussmaul, MD;  Location: [place];  Service: General;  Laterality: Right;   BREAST REDUCTION SURGERY Bilateral 11/21/2018   Procedure: BILATERAL MAMMARY REDUCTION  (BREAST);  Surgeon: Wallace Going, DO;  Location: [place];  Service: Plastics;  Laterality: Bilateral;   CHEST TUBE INSERTION Left 07/29/2020   Procedure: INSERTION PLEURAL DRAINAGE CATHETER;  Surgeon: Lajuana Matte, MD;  Location: [place];  Service: Thoracic;  Laterality: Left;   IRRIGATION AND DEBRIDEMENT STERNOCLAVICULAR JOINT-STERNUM AND RIBS N/A 08/13/2020   Procedure: IRRIGATION AND DEBRIDEMENT CHEST WALL ABSCESS;  Surgeon: Lajuana Matte, MD;  Location: [place];  Service: Cardiothoracic;  Laterality: N/A;   PLEURAL BIOPSY Left 07/29/2020   Procedure: PLEURAL BIOPSY;  Surgeon: Lajuana Matte, MD;  Location: [place];  Service: Thoracic;  Laterality: Left;   PLEURAL EFFUSION DRAINAGE Left 07/29/2020   Procedure: DRAINAGE OF PLEURAL EFFUSION;  Surgeon: Lajuana Matte, MD;  Location: [place];  Service: Thoracic;  Laterality: Left;   PORT-A-CATH REMOVAL N/A 07/06/2019   Procedure: REMOVAL PORT-A-CATH;  Surgeon: Jovita Kussmaul, MD;  Location: [place];  Service: General;  Laterality: N/A;   PORTACATH PLACEMENT N/A 11/21/2018   Procedure: INSERTION LEFT PORT-A-CATH WITH ULTRASOUND GUIDANCE;  Surgeon: Jovita Kussmaul, MD;  Location: [place];  Service: General;  Laterality: N/A;   [place] Left 07/29/2020   Procedure: VIDEO ASSISTED THORACOSCOPY;  Surgeon: Lajuana Matte, MD;  Location: [place];  Service: Thoracic;  Laterality: Left;   WISDOM TOOTH EXTRACTION       Prior to Admission medications   Medication Sig Start Date End Date Taking? Authorizing Provider  acetaminophen (TYLENOL) 325 MG tablet Take 2 tablets (650 mg total) by mouth every 6 (six) hours as needed for mild pain (or Fever >/= 101). 08/15/20   Nicole Kindred A, DO  albuterol (VENTOLIN HFA) 108 (90 Base) MCG/ACT inhaler INHALE 2 PUFFS INTO THE LUNGS EVERY 6 HOURS AS NEEDED FOR WHEEZING OR SHORTNESS OF BREATH 10/15/20   Lloyd Huger, MD  ALPRAZolam Duanne Moron) 0.25 MG tablet Take 1 tablet (0.25 mg total) by mouth 2 (two) times daily as needed for anxiety. 11/21/20   Verlon Au, NP  amphetamine-dextroamphetamine (ADDERALL XR) 30 MG 24 hr capsule Take 30 mg by mouth daily.    [provider]  amphetamine-dextroamphetamine (ADDERALL) 10 MG tablet Take 10 mg by mouth daily in the afternoon.    [provider]  Ascorbic Acid (VITAMIN C) 500 MG CAPS Take 500 mg by mouth daily.    [provider]  ibuprofen (ADVIL) 200 MG tablet Take 400 mg by mouth every 6 (six) hours as needed for mild pain.    [provider]  letrozole (FEMARA) 2.5 MG tablet Take 1 tablet (2.5 mg total) by mouth daily. 08/09/20   Lloyd Huger, MD  Multiple Vitamin (MULTIVITAMIN WITH MINERALS) TABS tablet Take 1 tablet by mouth daily.    [provider]  oxyCODONE (OXY IR/ROXICODONE) 5 MG immediate release tablet Take 1 tablet (5 mg total) by mouth every 6 (six) hours as needed for severe pain. 11/25/20   Lloyd Huger, MD  palbociclib (IBRANCE) 125 MG tablet Take 1 tablet (125 mg total) by mouth daily. Take for 21 days on, 7 days off, repeat every 28 days. Patient not taking: Reported on 12/05/2020 10/10/20   Darl Pikes, RPH-CPP  polyethylene glycol (MIRALAX / GLYCOLAX) 17 g packet Take 17 g by mouth daily. 08/15/20   Ezekiel Slocumb, DO  senna-docusate (SENOKOT-S) 8.6-50 MG tablet Stimulant Laxative Plus 8.6 mg-50 mg tablet  TAKE 1 TABLET BY MOUTH TWICE DAILY Patient not taking: Reported on 12/05/2020    [provider]  Tetrahydrozoline HCl (VISINE OP) Place 1 drop into both eyes daily as needed (redness). Patient not taking: Reported on 12/05/2020    [provider]  venlafaxine XR (EFFEXOR-XR)  150 MG 24 hr capsule TAKE 1 CAPSULE(150 MG) BY MOUTH DAILY WITH BREAKFAST 10/10/20   Lloyd Huger, MD  vitamin B-12 (CYANOCOBALAMIN) 1000 MCG tablet Take 1,000 mcg by mouth daily.    [provider]     Allergies Betadine [povidone-iodine] and Povidone iodine   Family History  Problem Relation Age of Onset   Hypertension Father    Alcohol abuse Paternal Grandfather    Heart disease Paternal Grandfather    Alcohol abuse Maternal Grandfather    Heart disease Maternal Grandmother    Breast cancer Other     Social History Social History   Tobacco Use   Smoking status: Former    Packs/day: 0.50    Years: 20.00    Pack years: 10.00    Types: Cigarettes    Start date: 01/13/2002    Quit date: 10/17/2016    Years since quitting: 4.1   Smokeless tobacco: Never  Vaping Use   Vaping Use: Never used  Substance Use Topics   Alcohol use: Yes    Comment:  Socially   Drug use: No    Review of Systems  Constitutional:   No fever or chills.  ENT:   No sore throat. No rhinorrhea. Cardiovascular:   No chest pain or syncope. Respiratory:   No dyspnea or cough. Gastrointestinal:   Negative for abdominal pain, vomiting and diarrhea.  Musculoskeletal:   Left ankle pain and swelling as above All other systems reviewed and are negative except as documented above in ROS and HPI.  ____________________________________________   PHYSICAL EXAM:  VITAL SIGNS: ED Triage Vitals  Enc Vitals Group     BP 12/14/20 1628 (!) 142/94     Pulse Rate 12/14/20 1628 (!) 115     Resp 12/14/20 1628 18     Temp 12/14/20 1628 98.4 F (36.9 C)     Temp src --      SpO2 12/14/20 1628 100 %     Weight 12/14/20 1629 168 lb (76.2 kg)     Height --      Head Circumference --      Peak Flow --      Pain Score 12/14/20 1628 6     Pain Loc --      Pain Edu? --      Excl. in [place]? --     Vital signs reviewed, nursing assessments reviewed.   Constitutional:   Alert and oriented. Non-toxic  appearance. Eyes:   Conjunctivae are normal. EOMI. ENT      Head:   Normocephalic and atraumatic.           Neck:   No meningismus. Full ROM. Cardiovascular:   RRR.  Normal DP pulse.  Normal capillary refill. Respiratory:   Normal respiratory effort without tachypnea/retractions.  Musculoskeletal:   Normal range of motion in all extremities.  There is soft nonpitting edema at the left ankle most prominent over the lateral malleolus.  No bony point tenderness.  No pain with passive range of motion.  No erythema warmth or induration.  No wounds. Neurologic:   Normal speech and language.  Motor grossly intact. No acute focal neurologic deficits are appreciated.  Skin:    Skin is warm, dry and intact. No rash noted.  No wounds.  ____________________________________________    LABS (pertinent positives/negatives) (all labs ordered are listed, but only abnormal results are displayed) Labs Reviewed  CBC WITH DIFFERENTIAL/PLATELET - Abnormal; Notable for the following components:      Result Value   RBC 3.69 (*)    Platelets 490 (*)    All other components within normal limits  BASIC METABOLIC PANEL - Abnormal; Notable for the following components:   Glucose, Bld 104 (*)    All other components within normal limits   ____________________________________________   EKG  ____________________________________________    RADIOLOGY  DG Ankle Complete Left  Result Date: 12/14/2020 CLINICAL DATA:  Left ankle pain, swelling EXAM: LEFT ANKLE COMPLETE - 3+ VIEW COMPARISON:  None. FINDINGS: Lateral soft tissue swelling. Chronic appearing bone fragments adjacent to the medial malleolus. No acute fracture, subluxation or dislocation. Soft tissues are intact. IMPRESSION: Bone fragments adjacent to the medial malleolus, likely chronic/old fractures. No acute bony abnormality. Electronically Signed   By: Rolm Baptise M.D.   On: 12/14/2020 18:25   MR Thoracic Spine W Wo Contrast  Result Date:  12/14/2020 CLINICAL DATA:  Initial evaluation for back pain, abnormal scan. EXAM: MRI THORACIC WITHOUT AND WITH CONTRAST TECHNIQUE: Multiplanar and multiecho pulse sequences of the thoracic spine were obtained without and with  intravenous contrast. CONTRAST:  40mL GADAVIST GADOBUTROL 1 MMOL/ML IV SOLN COMPARISON:  Prior PET-CT from 11/18/2020. FINDINGS: Alignment: Mild roto scoliosis. Alignment otherwise normal preservation of the normal thoracic kyphosis. No listhesis. Vertebrae: 1 cm T1 hypointense enhancing lesion seen within the left aspect of the T9 vertebral body, consistent with an osseous metastasis (series 24, image 10). No extra osseous extension, pathologic fracture, or other complication. No other evidence for metastatic disease within the thoracic spine. Underlying bone marrow signal intensity within normal limits. Mild discogenic reactive endplate change noted about the T5-6 and T7-8 interspaces anteriorly due to scoliosis. No other abnormal marrow edema or enhancement. Cord: Normal signal and morphology. No abnormal enhancement. No epidural tumor. Paraspinal and other soft tissues: Paraspinous soft tissues within normal limits. Irregular pleuroparenchymal thickening with adjacent architectural distortion within the visualized left lung, similar to prior exams, incompletely assessed on this study. Disc levels: Mild reactive endplate change about the T5-6 and T6-7 interspaces anteriorly. Otherwise, no other significant disc pathology seen within the thoracic spine. No disc bulge or focal disc herniation. No significant canal or foraminal stenosis or evidence for neural impingement. IMPRESSION: 1. 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 No extra osseous extension, pathologic fracture, or other complication. 2. No other evidence for metastatic disease within the thoracic spine. 3. Irregular pleuroparenchymal thickening with adjacent architectural distortion within the  visualized left lung, similar to prior exams. Electronically Signed   By: Jeannine Boga M.D.   On: 12/14/2020 03:56   US Venous Img Lower Unilateral Left  Result Date: 12/14/2020 CLINICAL DATA:  Left ankle swelling, pain EXAM: LEFT LOWER EXTREMITY VENOUS DOPPLER ULTRASOUND TECHNIQUE: Gray-scale sonography with compression, as well as color and duplex ultrasound, were performed to evaluate the deep venous system(s) from the level of the common femoral vein through the popliteal and proximal calf veins. COMPARISON:  None. FINDINGS: VENOUS Normal compressibility of the common femoral, superficial femoral, and popliteal veins, as well as the visualized calf veins. Visualized portions of profunda femoral vein and great saphenous vein unremarkable. No filling defects to suggest DVT on grayscale or color Doppler imaging. Doppler waveforms show normal direction of venous flow, normal respiratory plasticity and response to augmentation. Limited views of the contralateral common femoral vein are unremarkable. OTHER None. Limitations: none IMPRESSION: Negative. Electronically Signed   By: Rolm Baptise M.D.   On: 12/14/2020 17:35    ____________________________________________   PROCEDURES Procedures  ____________________________________________  CLINICAL IMPRESSION / ASSESSMENT AND PLAN / ED COURSE  Pertinent labs & imaging results that were available during my care of the patient were reviewed by me and considered in my medical decision making (see chart for details).  JANAKI EXLEY was evaluated in Emergency Department on 12/14/2020 for the symptoms described in the history of present illness. She was evaluated in the context of the global COVID-19 pandemic, which necessitated consideration that the patient might be at risk for infection with the SARS-CoV-2 virus that causes COVID-19. Institutional protocols and algorithms that pertain to the evaluation of patients at risk for COVID-19 are in a  state of rapid change based on information released by regulatory bodies including the CDC and federal and state organizations. These policies and algorithms were followed during the patient's care in the ED.   Patient presents with spontaneous pain and swelling of the left ankle.  Doubt septic arthritis osteomyelitis or soft tissue infection.  Its not necrotizing fasciitis.  No history of trauma.  Ultrasound negative  for DVT.  X-ray negative for acute fracture but does show some mineralization in the soft tissue overlying the lateral malleolus.  I think this is a Z[place] reaction.  We will treat supportively, send an additional prescription for oxycodone in addition to NSAIDs that she can take at home.  Ace wrap, elevation and follow-up with her doctor.      ____________________________________________   FINAL CLINICAL IMPRESSION(S) / ED DIAGNOSES    Final diagnoses:  Acute left ankle pain     ED Discharge Orders     None       Portions of this note were generated with dragon dictation software. Dictation errors may occur despite best attempts at proofreading.   Carrie Mew, MD 12/14/20 [phone]

## 2020-12-14 NOTE — Discharge Instructions (Addendum)
Continue taking Tylenol and ibuprofen/naproxen for your ankle pain and swelling.  Take oxycodone as needed as well for severe pain.  Use an Ace wrap and keep the ankle elevated when possible to help reduce swelling.

## 2020-12-14 NOTE — ED Triage Notes (Signed)
Pt c/o left ankle swelling and pain that started this morning. Pt has breast cancer. Per pt, she called her oncologist and reported her symptoms. She was told to come here to check for a blood clot in her leg.

## 2020-12-17 ENCOUNTER — Telehealth: Payer: Self-pay | Admitting: *Deleted

## 2020-12-17 NOTE — Telephone Encounter (Signed)
Patient sent mychart msg to request that FMLA be extended. She is not able to go back to work on 1/3. I reviewed chart. Duwayne Heck documented in October that she submitted new FMLA forms for for continuous leave to begin on 10/31/20 and end on 01/07/21.  mychart msg sent to patient to inquire  inquired with the patient- her estimated return date.- will wait for patient to respond back with this information before submitting her fmla papers.

## 2020-12-18 ENCOUNTER — Encounter: Payer: Self-pay | Admitting: Oncology

## 2020-12-18 ENCOUNTER — Telehealth: Payer: Self-pay | Admitting: Oncology

## 2020-12-18 ENCOUNTER — Inpatient Hospital Stay (HOSPITAL_BASED_OUTPATIENT_CLINIC_OR_DEPARTMENT_OTHER): Payer: BC Managed Care – PPO | Admitting: Oncology

## 2020-12-18 DIAGNOSIS — Z170 Estrogen receptor positive status [ER+]: Secondary | ICD-10-CM

## 2020-12-18 DIAGNOSIS — C50411 Malignant neoplasm of upper-outer quadrant of right female breast: Secondary | ICD-10-CM | POA: Diagnosis not present

## 2020-12-18 NOTE — Telephone Encounter (Signed)
Pt called and states that she didn't know about appt for today. Needs to change the time. Call back at [phone]

## 2020-12-18 NOTE — Telephone Encounter (Signed)
Patient replied back to [employer] via [employer]. She prefers her FMLA be extended to March 6'th. I will process this and refax it to her HR dept.

## 2020-12-18 NOTE — Telephone Encounter (Signed)
Updated fmla form faxed to HR dept with [employer]- at [phone]  12/18/20 at 1229pm

## 2020-12-18 NOTE — Progress Notes (Signed)
Patient reports going to the ED on Saturday for a swollen ankle and they told her it was from [place].

## 2020-12-19 ENCOUNTER — Encounter: Payer: Self-pay | Admitting: Oncology

## 2020-12-19 NOTE — Progress Notes (Signed)
[place]  [phone]) [phone] [phone]) [phone]  ID: Kaylee Romero OB: 06-24-1980  MR#: [id number]  WCB#:[id number]  Patient Care Team: Dion Body, MD as PCP - General (Family Medicine) Gery Pray, MD as Consulting Physician (Radiation Oncology) Jovita Kussmaul, MD as Consulting Physician (General Surgery) Dillingham, Loel Lofty, DO as Attending Physician (Plastic Surgery) Lloyd Huger, MD as Consulting Physician (Oncology)  I connected with Kaylee Romero on 12/19/20 at 11:15 AM EST by video enabled telemedicine visit and verified that I am speaking with the correct person using two identifiers.   I discussed the limitations, risks, security and privacy concerns of performing an evaluation and management service by telemedicine and the availability of in-person appointments. I also discussed with the patient that there may be a patient responsible charge related to this service. The patient expressed understanding and agreed to proceed.   Other persons participating in the visit and their role in the encounter: Patient, MD.  Patients location: Home. Providers location: Clinic.  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  INTERVAL HISTORY: Patient agreed to video assisted telemedicine visit for further evaluation and discussion of her MRI results.  She currently feels well and is asymptomatic.  She continues to be mildly anxious, but otherwise feels well.  She does not complain of shortness of breath today. She has no neurologic complaints.  She has a good appetite.  She denies any chest pain or hemoptysis.  Her left flank and back pain have improved.  She denies any nausea, vomiting, constipation, or diarrhea.  She has no urinary complaints.  Patient offers no further specific complaints today.  REVIEW OF SYSTEMS:   Review of Systems   Constitutional: Negative.  Negative for fever, malaise/fatigue and weight loss.  Respiratory: Negative.  Negative for cough, hemoptysis and shortness of breath.   Cardiovascular: Negative.  Negative for chest pain and leg swelling.  Gastrointestinal: Negative.  Negative for abdominal pain.  Genitourinary: Negative.  Negative for dysuria and flank pain.  Musculoskeletal: Negative.  Negative for back pain.  Skin: Negative.  Negative for rash.  Neurological: Negative.  Negative for dizziness, focal weakness, weakness and headaches.  Psychiatric/Behavioral:  The patient is nervous/anxious. The patient does not have insomnia.    As per HPI. Otherwise, a complete review of systems is negative.  PAST MEDICAL HISTORY: Past Medical History:  Diagnosis Date   Anxiety    Family history of breast cancer    HSV (herpes simplex virus) anogenital infection    positive titer only    PAST SURGICAL HISTORY: Past Surgical History:  Procedure Laterality Date   BREAST LUMPECTOMY WITH AXILLARY LYMPH NODE BIOPSY Right 11/21/2018   Procedure: RIGHT BREAST LUMPECTOMY WITH SENTINEL LYMPH NODE BIOPSY;  Surgeon: Jovita Kussmaul, MD;  Location: [facility];  Service: General;  Laterality: Right;   BREAST REDUCTION SURGERY Bilateral 11/21/2018   Procedure: BILATERAL MAMMARY REDUCTION  (BREAST);  Surgeon: Wallace Going, DO;  Location: [facility];  Service: Plastics;  Laterality: Bilateral;   CHEST TUBE INSERTION Left 07/29/2020   Procedure: INSERTION PLEURAL DRAINAGE CATHETER;  Surgeon: Lajuana Matte, MD;  Location: [facility];  Service: Thoracic;  Laterality: Left;   IRRIGATION AND DEBRIDEMENT STERNOCLAVICULAR JOINT-STERNUM AND RIBS N/A 08/13/2020   Procedure: IRRIGATION AND DEBRIDEMENT CHEST WALL ABSCESS;  Surgeon: Lajuana Matte, MD;  Location: [facility];  Service: Cardiothoracic;  Laterality: N/A;   PLEURAL BIOPSY  Left 07/29/2020   Procedure: PLEURAL BIOPSY;  Surgeon: Lajuana Matte, MD;  Location: [facility];   Service: Thoracic;  Laterality: Left;   PLEURAL EFFUSION DRAINAGE Left 07/29/2020   Procedure: DRAINAGE OF PLEURAL EFFUSION;  Surgeon: Lajuana Matte, MD;  Location: [facility];  Service: Thoracic;  Laterality: Left;   PORT-A-CATH REMOVAL N/A 07/06/2019   Procedure: REMOVAL PORT-A-CATH;  Surgeon: Jovita Kussmaul, MD;  Location: [facility];  Service: General;  Laterality: N/A;   PORTACATH PLACEMENT N/A 11/21/2018   Procedure: INSERTION LEFT PORT-A-CATH WITH ULTRASOUND GUIDANCE;  Surgeon: Jovita Kussmaul, MD;  Location: [facility];  Service: General;  Laterality: N/A;   TONSILLECTOMY  1987   TONSILLECTOMY     VIDEO ASSISTED THORACOSCOPY Left 07/29/2020   Procedure: VIDEO ASSISTED THORACOSCOPY;  Surgeon: Lajuana Matte, MD;  Location[facility];  Service: Thoracic;  Laterality: Left;   WISDOM TOOTH EXTRACTION      FAMILY HISTORY: Family History  Problem Relation Age of Onset   Hypertension Father    Alcohol abuse Paternal Grandfather    Heart disease Paternal Grandfather    Alcohol abuse Maternal Grandfather    Heart disease Maternal Grandmother    Breast cancer Other     ADVANCED DIRECTIVES (Y/N):  N  HEALTH MAINTENANCE: Social History   Tobacco Use   Smoking status: Former    Packs/day: 0.50    Years: 20.00    Pack years: 10.00    Types: Cigarettes    Start date: 01/13/2002    Quit date: 10/17/2016    Years since quitting: 4.1   Smokeless tobacco: Never  Vaping Use   Vaping Use: Never used  Substance Use Topics   Alcohol use: Yes    Comment: Socially   Drug use: No     Colonoscopy:  PAP:  Bone density:  Lipid panel:  Allergies  Allergen Reactions   Betadine [Povidone-Iodine]    Povidone Iodine Rash    Current Outpatient Medications  Medication Sig Dispense Refill   acetaminophen (TYLENOL) 325 MG tablet Take 2 tablets (650 mg total) by mouth every 6 (six) hours as needed for mild pain (or Fever >/= 101).     albuterol (VENTOLIN HFA) 108 (90 Base)  MCG/ACT inhaler INHALE 2 PUFFS INTO THE LUNGS EVERY 6 HOURS AS NEEDED FOR WHEEZING OR SHORTNESS OF BREATH 6.7 g 2   ALPRAZolam (XANAX) 0.25 MG tablet Take 1 tablet (0.25 mg total) by mouth 2 (two) times daily as needed for anxiety. 60 tablet 0   amphetamine-dextroamphetamine (ADDERALL XR) 30 MG 24 hr capsule Take 30 mg by mouth daily.     amphetamine-dextroamphetamine (ADDERALL) 10 MG tablet Take 10 mg by mouth daily in the afternoon.     Ascorbic Acid (VITAMIN C) 500 MG CAPS Take 500 mg by mouth daily.     HYDROcodone-acetaminophen (NORCO) 10-325 MG tablet hydrocodone 10 mg-acetaminophen 325 mg tablet  TAKE 1-2 TABLETS BY MOUTH EVERY 4 HOURS AS NEEDED FOR MODERATE PAIN     ibuprofen (ADVIL) 200 MG tablet Take 400 mg by mouth every 6 (six) hours as needed for mild pain.     letrozole (FEMARA) 2.5 MG tablet Take 1 tablet (2.5 mg total) by mouth daily. 90 tablet 3   Multiple Vitamin (MULTIVITAMIN WITH MINERALS) TABS tablet Take 1 tablet by mouth daily.     oxyCODONE (OXY IR/ROXICODONE) 5 MG immediate release tablet Take 1 tablet (5 mg total) by mouth every 6 (six) hours as needed for severe  pain. 30 tablet 0   oxyCODONE (ROXICODONE) 5 MG immediate release tablet Take 1 tablet (5 mg total) by mouth every 6 (six) hours as needed for breakthrough pain. 12 tablet 0   polyethylene glycol (MIRALAX / GLYCOLAX) 17 g packet Take 17 g by mouth daily. 14 each 0   venlafaxine XR (EFFEXOR-XR) 150 MG 24 hr capsule TAKE 1 CAPSULE(150 MG) BY MOUTH DAILY WITH BREAKFAST 30 capsule 1   vitamin B-12 (CYANOCOBALAMIN) 1000 MCG tablet Take 1,000 mcg by mouth daily.     palbociclib (IBRANCE) 125 MG tablet Take 1 tablet (125 mg total) by mouth daily. Take for 21 days on, 7 days off, repeat every 28 days. (Patient not taking: Reported on 12/05/2020) 21 tablet 2   senna-docusate (SENOKOT-S) 8.6-50 MG tablet Stimulant Laxative Plus 8.6 mg-50 mg tablet  TAKE 1 TABLET BY MOUTH TWICE DAILY (Patient not taking: Reported on 12/05/2020)      Tetrahydrozoline HCl (VISINE OP) Place 1 drop into both eyes daily as needed (redness). (Patient not taking: Reported on 12/05/2020)     No current facility-administered medications for this visit.    OBJECTIVE: There were no vitals filed for this visit.     There is no height or weight on file to calculate BMI.    ECOG FS:0 - Asymptomatic  General: Well-developed, well-nourished, no acute distress. HEENT: Normocephalic. Neuro: Alert, answering all questions appropriately. Cranial nerves grossly intact. Psych: Normal affect.   LAB RESULTS:  Lab Results  Component Value Date   NA 136 12/14/2020   K 4.2 12/14/2020   CL 100 12/14/2020   CO2 29 12/14/2020   GLUCOSE 104 (H) 12/14/2020   BUN 15 12/14/2020   CREATININE 0.59 12/14/2020   CALCIUM 9.2 12/14/2020   PROT 8.3 (H) 12/05/2020   ALBUMIN 4.8 12/05/2020   AST 29 12/05/2020   ALT 37 12/05/2020   ALKPHOS 77 12/05/2020   BILITOT 0.3 12/05/2020   GFRNONAA >60 12/14/2020   GFRAA >60 08/07/2019    Lab Results  Component Value Date   WBC 5.9 12/14/2020   NEUTROABS 4.1 12/14/2020   HGB 12.5 12/14/2020   HCT 36.5 12/14/2020   MCV 98.9 12/14/2020   PLT 490 (H) 12/14/2020     STUDIES: DG Ankle Complete Left  Result Date: 12/14/2020 CLINICAL DATA:  Left ankle pain, swelling EXAM: LEFT ANKLE COMPLETE - 3+ VIEW COMPARISON:  None. FINDINGS: Lateral soft tissue swelling. Chronic appearing bone fragments adjacent to the medial malleolus. No acute fracture, subluxation or dislocation. Soft tissues are intact. IMPRESSION: Bone fragments adjacent to the medial malleolus, likely chronic/old fractures. No acute bony abnormality. Electronically Signed   By: Rolm Baptise M.D.   On: 12/14/2020 18:25   MR Thoracic Spine W Wo Contrast  Result Date: 12/14/2020 CLINICAL DATA:  Initial evaluation for back pain, abnormal scan. EXAM: MRI THORACIC WITHOUT AND WITH CONTRAST TECHNIQUE: Multiplanar and multiecho pulse sequences of the  thoracic spine were obtained without and with intravenous contrast. CONTRAST:  11mL GADAVIST GADOBUTROL 1 MMOL/ML IV SOLN COMPARISON:  Prior PET-CT from 11/18/2020. FINDINGS: Alignment: Mild roto scoliosis. Alignment otherwise normal preservation of the normal thoracic kyphosis. No listhesis. Vertebrae: 1 cm T1 hypointense enhancing lesion seen within the left aspect of the T9 vertebral body, consistent with an osseous metastasis (series 24, image 10). No extra osseous extension, pathologic fracture, or other complication. No other evidence for metastatic disease within the thoracic spine. Underlying bone marrow signal intensity within normal limits. Mild discogenic reactive endplate change noted  about the T5-6 and T7-8 interspaces anteriorly due to scoliosis. No other abnormal marrow edema or enhancement. Cord: Normal signal and morphology. No abnormal enhancement. No epidural tumor. Paraspinal and other soft tissues: Paraspinous soft tissues within normal limits. Irregular pleuroparenchymal thickening with adjacent architectural distortion within the visualized left lung, similar to prior exams, incompletely assessed on this study. Disc levels: Mild reactive endplate change about the T5-6 and T6-7 interspaces anteriorly. Otherwise, no other significant disc pathology seen within the thoracic spine. No disc bulge or focal disc herniation. No significant canal or foraminal stenosis or evidence for neural impingement. IMPRESSION: 1. 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 No extra osseous extension, pathologic fracture, or other complication. 2. No other evidence for metastatic disease within the thoracic spine. 3. Irregular pleuroparenchymal thickening with adjacent architectural distortion within the visualized left lung, similar to prior exams. Electronically Signed   By: Jeannine Boga M.D.   On: 12/14/2020 03:56   MR LIVER W WO CONTRAST  Result Date: 12/13/2020 CLINICAL  DATA:  Recurrent breast cancer. New liver lesions on CT, evaluate for metastatic disease. EXAM: MRI ABDOMEN WITHOUT AND WITH CONTRAST TECHNIQUE: Multiplanar multisequence MR imaging of the abdomen was performed both before and after the administration of intravenous contrast. CONTRAST:  49mL GADAVIST GADOBUTROL 1 MMOL/ML IV SOLN COMPARISON:  11/12/2020 CT.  PET 11/18/2020 FINDINGS: Lower chest: Left-sided pleural thickening and airspace disease are suboptimally evaluated. A right lower lobe pulmonary nodule of 5 mm has been evaluated on prior CTs. Normal heart size. Hepatobiliary: No cirrhosis. The 1.0 cm high right hepatic lobe lesion on the prior CT is only readily apparent on post-contrast image 25/16, is relatively occult on precontrast imaging, felt unlikely to represent a metastasis. However, there are multiple tiny lesions which have imaging characteristics suspicious for metastasis. These are identified on T2 and diffusion-weighted imaging. Examples include within the anterior left hepatic lobe (corresponding to the 7 mm CT abnormality) on 85/8, hepatic dome on 77/8, central right hepatic lobe on 82/8, and anterior right hepatic lobe on 84/8. Probable concurrent decreased signal on ADC map involving some of these lesions, including on images 10 and 13 of series 9. After contrast, some of these lesions are subtly apparent. Example in the hepatic dome at 6 mm on 24/21 and within the central right hepatic lobe at 3-4 mm on 31/21. Normal gallbladder, without biliary ductal dilatation. Pancreas:  Normal, without mass or ductal dilatation. Spleen:  Normal in size, without focal abnormality. Adrenals/Urinary Tract: Normal adrenal glands. Normal kidneys, without hydronephrosis. Stomach/Bowel: Tiny hiatal hernia. Large colonic stool burden. Normal small bowel caliber. Vascular/Lymphatic: Normal caliber of the aorta and branch vessels. No retroperitoneal or retrocrural adenopathy. Other:  No abdominal ascites or  peritoneal metastasis. Musculoskeletal: Osseous metastasis, including a left-sided lower thoracic enhancing lesion at 8 mm on 10/18. IMPRESSION: 1. Multiple tiny liver lesions which are suspicious for low volume hepatic metastasis. Without prior MRIs for direct comparison, these are of indeterminate acuity. 2. Osseous metastasis, as on recent PET. Electronically Signed   By: Abigail Miyamoto M.D.   On: 12/13/2020 09:46   US Venous Img Lower Unilateral Left  Result Date: 12/14/2020 CLINICAL DATA:  Left ankle swelling, pain EXAM: LEFT LOWER EXTREMITY VENOUS DOPPLER ULTRASOUND TECHNIQUE: Gray-scale sonography with compression, as well as color and duplex ultrasound, were performed to evaluate the deep venous system(s) from the level of the common femoral vein through the popliteal and proximal calf veins. COMPARISON:  None.  FINDINGS: VENOUS Normal compressibility of the common femoral, superficial femoral, and popliteal veins, as well as the visualized calf veins. Visualized portions of profunda femoral vein and great saphenous vein unremarkable. No filling defects to suggest DVT on grayscale or color Doppler imaging. Doppler waveforms show normal direction of venous flow, normal respiratory plasticity and response to augmentation. Limited views of the contralateral common femoral vein are unremarkable. OTHER None. Limitations: none IMPRESSION: Negative. Electronically Signed   By: Rolm Baptise M.D.   On: 12/14/2020 17:35   DG Hand Complete Right  Result Date: 12/13/2020 CLINICAL DATA:  Hand pain and swelling EXAM: RIGHT HAND - COMPLETE 3+ VIEW COMPARISON:  None. FINDINGS: No fracture or malalignment. Mild arthritis at the first [facility] joint. Soft tissues are unremarkable IMPRESSION: Mild arthritis at the first [facility] joint. Electronically Signed   By: Donavan Foil M.D.   On: 12/13/2020 21:57    ONCOLOGY HISTORY: Patient initially self palpated a lump in the 12 o'clock position of her right breast and subsequently  underwent biopsy on September 26, 2018 confirming malignancy.  Initial MammaPrint was reported high risk.  She underwent right lumpectomy on November 21, 2018 which revealed a T2, N1, M0 stage IIa malignancy.  1 of 4 lymph nodes were positive for disease.  She subsequently underwent adjuvant chemotherapy with AC/Taxol completing treatment on Jun 01, 2019.  She then completed adjuvant XRT on August 11, 2019 and was started on tamoxifen.  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  MRI of the brain on August 18, 2020 was negative for metastatic disease.  Patient also underwent second opinion at [facility] in [place].  Agree with recommendation to proceed with Lupron 3.75 mg every 28 days for up to 24 months for ovarian suppression along with an aromatase inhibitor and Ibrance 125 mg for 21 days with 7 days off.  She has been instructed to discontinue tamoxifen.  Repeat PET scan on November 18, 2020 reviewed independently and reported as above with marked improvement of metabolic activity in the left lung, but concern for new lytic lesion at T10 vertebrae and left ribs.  Patient was once again seen in [facility] also had second opinion at [facility] and all parties are in agreement to continue current treatment for 2 additional months and then repeat PET scan.  MRI of the liver completed on December 12, 2020 reviewed independently and report as above with several lesions suspicious for metastatic disease, but too small to characterize.  MRI of thoracic spine on December 13, 2020 revealed a likely metastatic lesion in T9 vertebrae.  Despite this, patient's CA  27-29 is trending down and is now 203.6.  Patient is currently receiving cycle 5 of Ibrance.  We will continue treatment as planned.  Return to clinic as previously scheduled for continuation of Lupron and Zometa.  Plan to repeat imaging in several months to assess for interval change.  Case discussed at length with Dr. Samuel Bouche at [facility]. Cough/shortness of breath: Improved.  PET scan as above.  Continue Tussionex, hydrocodone, and albuterol inhaler.  Recent ultrasound did not reveal a significant amount of fluid for thoracentesis.   Anasarca/fluid retention: Resolved.   Pleural abscess:  Treated and drained by thoracic surgery in [place].  Resolved.  Chest x-ray and possible thoracentesis as above.   Thrombocytosis: Resolved. Hypercalcemia: Resolved. Transaminitis: Resolved.  I spent a total of 30 minutes reviewing chart data, face-to-face evaluation with the patient, counseling and coordination of care as detailed above.   Patient expressed understanding and was in agreement with this plan. She also understands that She can call clinic at any time with any questions, concerns, or complaints.     Cancer Staging  Malignant neoplasm of upper-outer quadrant of right breast in female, estrogen receptor positive ([place]) Staging form: Breast, AJCC 8th Edition - Clinical stage from 10/05/2018: Stage IB (cT2, cN0, cM0, G2, ER+, PR+, HER2-) - Signed by Chauncey Cruel, MD on 04/02/2020 Stage prefix: Initial diagnosis Histologic grading system: 3 grade system - Pathologic stage from 08/09/2020: No Stage Recommended (ypT2, pN1a, pM1, G2, ER+, PR+, HER2-) - Signed by Lloyd Huger, MD on 08/09/2020 Stage prefix: Post-therapy Multigene prognostic tests performed: MammaPrint Histologic grading system: 3 grade system   Lloyd Huger, MD   12/19/2020 6:38 AM

## 2020-12-20 ENCOUNTER — Encounter: Payer: Self-pay | Admitting: Oncology

## 2020-12-20 MED ORDER — HYDROCOD POLST-CPM POLST ER 10-8 MG/5ML PO SUER: 5.0000 mL | Freq: Two times a day (BID) | ORAL | 0 refills | Status: DC | PRN

## 2020-12-20 NOTE — Telephone Encounter (Signed)
I spoke with patient by phone.  She denies fever or chills or shortness of breath/wheezing.  She has mild cold-like symptoms with nasal congestion and nonproductive cough.  She is taking Mucinex and has been using her previous prescription of Tussionex with good effect.  Patient request refill of Tussionex which I will send to pharmacy.  Discussed ER triggers or utilization of urgent care symptoms worsening over the weekend.  We also discussed COVID/flu testing, although patient is outside the window for effective treatment for either of those at this point as symptoms have persisted over the past week

## 2020-12-24 ENCOUNTER — Encounter: Payer: Self-pay | Admitting: Oncology

## 2020-12-24 DIAGNOSIS — C50911 Malignant neoplasm of unspecified site of right female breast: Secondary | ICD-10-CM

## 2020-12-26 ENCOUNTER — Other Ambulatory Visit (HOSPITAL_COMMUNITY): Payer: Self-pay

## 2020-12-26 ENCOUNTER — Other Ambulatory Visit: Payer: Self-pay | Admitting: *Deleted

## 2020-12-26 DIAGNOSIS — C50911 Malignant neoplasm of unspecified site of right female breast: Secondary | ICD-10-CM

## 2020-12-26 MED ORDER — OXYCODONE HCL 5 MG PO TABS: 5.0000 mg | ORAL_TABLET | Freq: Four times a day (QID) | ORAL | 0 refills | Status: DC | PRN

## 2020-12-26 NOTE — Telephone Encounter (Signed)
Per mychart msg- needs Rf on narcotic. Josh pls consider RF

## 2020-12-30 NOTE — Progress Notes (Addendum)
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  INTERVAL HISTORY: Patient returns to clinic today for further evaluation and continuation of Lupron and Zometa.  She has left-sided chest/flank pain that is new over the past week, but otherwise feels well.  She does not complain of shortness of breath today. She has a mild intermittent neuropathy, but no other neurologic complaints.  She has a good appetite.  She denies any cough or hemoptysis. She denies any nausea, vomiting, constipation, or diarrhea.  She has no urinary complaints.  Patient offers no further complaints today.  REVIEW OF SYSTEMS:   Review of Systems  Constitutional: Negative.  Negative for fever, malaise/fatigue and weight loss.  Respiratory: Negative.  Negative for cough, hemoptysis and shortness of breath.   Cardiovascular:  Positive for chest pain. Negative for leg swelling.  Gastrointestinal: Negative.  Negative for abdominal pain.  Genitourinary:  Positive for flank pain. Negative for dysuria.  Musculoskeletal:  Negative for back pain.  Skin: Negative.  Negative for rash.  Neurological:  Positive for tingling. Negative for dizziness, focal weakness, weakness and headaches.  Psychiatric/Behavioral:  The patient is nervous/anxious. The patient does not have insomnia.    As per HPI. Otherwise, a complete review of systems is negative.  PAST MEDICAL HISTORY: Past  Medical History:  Diagnosis Date   Anxiety    Family history of breast cancer    HSV (herpes simplex virus) anogenital infection    positive titer only    PAST SURGICAL HISTORY: Past Surgical History:  Procedure Laterality Date   BREAST LUMPECTOMY WITH AXILLARY LYMPH NODE BIOPSY Right 11/21/2018   Procedure: RIGHT BREAST LUMPECTOMY WITH SENTINEL LYMPH NODE BIOPSY;  Surgeon: Jovita Kussmaul, MD;  Location: [place];  Service: General;  Laterality: Right;   BREAST REDUCTION SURGERY Bilateral 11/21/2018   Procedure: BILATERAL MAMMARY REDUCTION  (BREAST);  Surgeon: Wallace Going, DO;  Location: [place];  Service: Plastics;  Laterality: Bilateral;   CHEST TUBE INSERTION Left 07/29/2020   Procedure: INSERTION PLEURAL DRAINAGE CATHETER;  Surgeon: Lajuana Matte, MD;  Location: [place];  Service: Thoracic;  Laterality: Left;   IRRIGATION AND DEBRIDEMENT STERNOCLAVICULAR JOINT-STERNUM AND RIBS N/A 08/13/2020   Procedure: IRRIGATION AND DEBRIDEMENT CHEST WALL ABSCESS;  Surgeon: Lajuana Matte, MD;  Location: [place];  Service: Cardiothoracic;  Laterality: N/A;   PLEURAL BIOPSY Left 07/29/2020   Procedure: PLEURAL BIOPSY;  Surgeon: Lajuana Matte, MD;  Location: [place];  Service: Thoracic;  Laterality: Left;   PLEURAL EFFUSION DRAINAGE Left 07/29/2020   Procedure: DRAINAGE OF PLEURAL EFFUSION;  Surgeon: Lajuana Matte, MD;  Location: [place];  Service: Thoracic;  Laterality: Left;   PORT-A-CATH REMOVAL N/A 07/06/2019   Procedure: REMOVAL PORT-A-CATH;  Surgeon: Jovita Kussmaul, MD;  Location: [place];  Service: General;  Laterality: N/A;   PORTACATH PLACEMENT N/A 11/21/2018   Procedure: INSERTION LEFT PORT-A-CATH WITH ULTRASOUND GUIDANCE;  Surgeon: Jovita Kussmaul, MD;  Location: [place];  Service: General;  Laterality: N/A;   TONSILLECTOMY  1987   TONSILLECTOMY     VIDEO ASSISTED THORACOSCOPY Left 07/29/2020   Procedure: VIDEO ASSISTED THORACOSCOPY;  Surgeon: Lajuana Matte, MD;  [facility];  Service: Thoracic;  Laterality: Left;   WISDOM TOOTH EXTRACTION      FAMILY HISTORY: Family History  Problem Relation Age of Onset   Hypertension Father    Alcohol abuse Paternal Grandfather    Heart disease Paternal Grandfather    Alcohol abuse Maternal Grandfather    Heart disease Maternal Grandmother    Breast cancer Other     ADVANCED DIRECTIVES (Y/N):  N  HEALTH MAINTENANCE: Social History   Tobacco Use   Smoking status: Former    Packs/day: 0.50    Years: 20.00    Pack years: 10.00    Types: Cigarettes    Start date: 01/13/2002    Quit date: 10/17/2016    Years since quitting: 4.2   Smokeless tobacco: Never  Vaping Use   Vaping Use: Never used  Substance Use Topics   Alcohol use: Yes    Comment: Socially   Drug use: No     Colonoscopy:  PAP:  Bone density:  Lipid panel:  Allergies  Allergen Reactions   Betadine [Povidone-Iodine]    Povidone Iodine Rash    Current Outpatient Medications  Medication Sig Dispense Refill   acetaminophen (TYLENOL) 325 MG tablet Take 2 tablets (650 mg total) by mouth every 6 (six) hours as needed for mild pain (or Fever >/= 101).     albuterol (VENTOLIN HFA) 108 (90 Base) MCG/ACT inhaler INHALE 2 PUFFS INTO THE LUNGS EVERY 6 HOURS AS NEEDED FOR WHEEZING OR SHORTNESS OF BREATH 6.7 g 2   ALPRAZolam (XANAX) 0.25 MG tablet Take 1 tablet (0.25 mg total) by mouth 2 (two) times daily as needed for anxiety. 60 tablet 0   amphetamine-dextroamphetamine (ADDERALL XR) 30 MG 24 hr capsule Take 30 mg by mouth daily.     amphetamine-dextroamphetamine (ADDERALL) 10 MG tablet Take 10 mg by mouth daily in the afternoon.     Ascorbic Acid (VITAMIN C) 500 MG CAPS Take 500 mg by mouth daily.     chlorpheniramine-HYDROcodone (TUSSIONEX) 10-8 MG/5ML SUER Take 5 mLs by mouth every 12 (twelve) hours as needed for cough. 140 mL 0   ibuprofen (ADVIL) 200 MG tablet Take 400 mg by mouth every 6 (six) hours as needed for  mild pain.     letrozole (FEMARA) 2.5 MG tablet Take 1 tablet (2.5 mg total) by mouth daily. 90 tablet 3   morphine (MS CONTIN) 15 MG 12 hr tablet Take 1 tablet (15 mg total) by mouth every 12 (twelve) hours. 60 tablet 0   Multiple Vitamin (MULTIVITAMIN WITH MINERALS) TABS tablet Take 1 tablet by mouth daily.     oxyCODONE (OXY IR/ROXICODONE) 5 MG immediate release tablet Take 1 tablet (5 mg total) by mouth every 6 (six) hours as needed for severe pain. 30 tablet 0   palbociclib (IBRANCE) 125 MG tablet Take 1 tablet (125 mg total) by mouth daily. Take for 21 days on, 7 days off, repeat every 28 days. 21 tablet 2   polyethylene glycol (MIRALAX / GLYCOLAX) 17 g packet Take 17 g by mouth daily. 14 each 0   senna-docusate (SENOKOT-S) 8.6-50 MG tablet      Tetrahydrozoline HCl (VISINE OP) Place 1 drop into both eyes daily as needed (redness).     venlafaxine XR (EFFEXOR-XR) 150 MG 24 hr capsule TAKE 1 CAPSULE(150 MG) BY MOUTH  DAILY WITH BREAKFAST 30 capsule 1   vitamin B-12 (CYANOCOBALAMIN) 1000 MCG tablet Take 1,000 mcg by mouth daily.     No current facility-administered medications for this visit.   Facility-Administered Medications Ordered in Other Visits  Medication Dose Route Frequency Provider Last Rate Last Admin   0.9 %  sodium chloride infusion   Intravenous Continuous Lloyd Huger, MD   Stopped at 01/02/21 1601    OBJECTIVE: Vitals:   01/02/21 1431  BP: 119/86  Pulse: (!) 121  Resp: 20  Temp: 97.8 F (36.6 C)  SpO2: 100%       Body mass index is 29.4 kg/m.    ECOG FS:0 - Asymptomatic  General: Well-developed, well-nourished, no acute distress. Eyes: Pink conjunctiva, anicteric sclera. HEENT: Normocephalic, moist mucous membranes. Lungs: No audible wheezing or coughing. Chest wall: Nontender. Heart: Regular rate and rhythm. Abdomen: Soft, nontender, no obvious distention. Musculoskeletal: No edema, cyanosis, or clubbing. Neuro: Alert, answering all questions  appropriately. Cranial nerves grossly intact. Skin: No rashes or petechiae noted. Psych: Normal affect.   LAB RESULTS:  Lab Results  Component Value Date   NA 137 01/02/2021   K 3.8 01/02/2021   CL 98 01/02/2021   CO2 29 01/02/2021   GLUCOSE 142 (H) 01/02/2021   BUN 13 01/02/2021   CREATININE 0.57 01/02/2021   CALCIUM 9.1 01/02/2021   PROT 7.6 01/02/2021   ALBUMIN 4.0 01/02/2021   AST 25 01/02/2021   ALT 25 01/02/2021   ALKPHOS 74 01/02/2021   BILITOT 0.3 01/02/2021   GFRNONAA >60 01/02/2021   GFRAA >60 08/07/2019    Lab Results  Component Value Date   WBC 4.1 01/02/2021   NEUTROABS 2.2 01/02/2021   HGB 13.1 01/02/2021   HCT 37.8 01/02/2021   MCV 100.3 (H) 01/02/2021   PLT 313 01/02/2021     STUDIES: DG Ankle Complete Left  Result Date: 12/14/2020 CLINICAL DATA:  Left ankle pain, swelling EXAM: LEFT ANKLE COMPLETE - 3+ VIEW COMPARISON:  None. FINDINGS: Lateral soft tissue swelling. Chronic appearing bone fragments adjacent to the medial malleolus. No acute fracture, subluxation or dislocation. Soft tissues are intact. IMPRESSION: Bone fragments adjacent to the medial malleolus, likely chronic/old fractures. No acute bony abnormality. Electronically Signed   By: Rolm Baptise M.D.   On: 12/14/2020 18:25   MR Thoracic Spine W Wo Contrast  Result Date: 12/14/2020 CLINICAL DATA:  Initial evaluation for back pain, abnormal scan. EXAM: MRI THORACIC WITHOUT AND WITH CONTRAST TECHNIQUE: Multiplanar and multiecho pulse sequences of the thoracic spine were obtained without and with intravenous contrast. CONTRAST:  38mL GADAVIST GADOBUTROL 1 MMOL/ML IV SOLN COMPARISON:  Prior PET-CT from 11/18/2020. FINDINGS: Alignment: Mild roto scoliosis. Alignment otherwise normal preservation of the normal thoracic kyphosis. No listhesis. Vertebrae: 1 cm T1 hypointense enhancing lesion seen within the left aspect of the T9 vertebral body, consistent with an osseous metastasis (series 24, image  10). No extra osseous extension, pathologic fracture, or other complication. No other evidence for metastatic disease within the thoracic spine. Underlying bone marrow signal intensity within normal limits. Mild discogenic reactive endplate change noted about the T5-6 and T7-8 interspaces anteriorly due to scoliosis. No other abnormal marrow edema or enhancement. Cord: Normal signal and morphology. No abnormal enhancement. No epidural tumor. Paraspinal and other soft tissues: Paraspinous soft tissues within normal limits. Irregular pleuroparenchymal thickening with adjacent architectural distortion within the visualized left lung, similar to prior exams, incompletely assessed on this study. Disc levels: Mild reactive endplate change about  the T5-6 and T6-7 interspaces anteriorly. Otherwise, no other significant disc pathology seen within the thoracic spine. No disc bulge or focal disc herniation. No significant canal or foraminal stenosis or evidence for neural impingement. IMPRESSION: 1. 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 No extra osseous extension, pathologic fracture, or other complication. 2. No other evidence for metastatic disease within the thoracic spine. 3. Irregular pleuroparenchymal thickening with adjacent architectural distortion within the visualized left lung, similar to prior exams. Electronically Signed   By: Jeannine Boga M.D.   On: 12/14/2020 03:56   MR LIVER W WO CONTRAST  Result Date: 12/13/2020 CLINICAL DATA:  Recurrent breast cancer. New liver lesions on CT, evaluate for metastatic disease. EXAM: MRI ABDOMEN WITHOUT AND WITH CONTRAST TECHNIQUE: Multiplanar multisequence MR imaging of the abdomen was performed both before and after the administration of intravenous contrast. CONTRAST:  28mL GADAVIST GADOBUTROL 1 MMOL/ML IV SOLN COMPARISON:  11/12/2020 CT.  PET 11/18/2020 FINDINGS: Lower chest: Left-sided pleural thickening and airspace disease are  suboptimally evaluated. A right lower lobe pulmonary nodule of 5 mm has been evaluated on prior CTs. Normal heart size. Hepatobiliary: No cirrhosis. The 1.0 cm high right hepatic lobe lesion on the prior CT is only readily apparent on post-contrast image 25/16, is relatively occult on precontrast imaging, felt unlikely to represent a metastasis. However, there are multiple tiny lesions which have imaging characteristics suspicious for metastasis. These are identified on T2 and diffusion-weighted imaging. Examples include within the anterior left hepatic lobe (corresponding to the 7 mm CT abnormality) on 85/8, hepatic dome on 77/8, central right hepatic lobe on 82/8, and anterior right hepatic lobe on 84/8. Probable concurrent decreased signal on ADC map involving some of these lesions, including on images 10 and 13 of series 9. After contrast, some of these lesions are subtly apparent. Example in the hepatic dome at 6 mm on 24/21 and within the central right hepatic lobe at 3-4 mm on 31/21. Normal gallbladder, without biliary ductal dilatation. Pancreas:  Normal, without mass or ductal dilatation. Spleen:  Normal in size, without focal abnormality. Adrenals/Urinary Tract: Normal adrenal glands. Normal kidneys, without hydronephrosis. Stomach/Bowel: Tiny hiatal hernia. Large colonic stool burden. Normal small bowel caliber. Vascular/Lymphatic: Normal caliber of the aorta and branch vessels. No retroperitoneal or retrocrural adenopathy. Other:  No abdominal ascites or peritoneal metastasis. Musculoskeletal: Osseous metastasis, including a left-sided lower thoracic enhancing lesion at 8 mm on 10/18. IMPRESSION: 1. Multiple tiny liver lesions which are suspicious for low volume hepatic metastasis. Without prior MRIs for direct comparison, these are of indeterminate acuity. 2. Osseous metastasis, as on recent PET. Electronically Signed   By: Abigail Miyamoto M.D.   On: 12/13/2020 09:46   US Venous Img Lower Unilateral  Left  Result Date: 12/14/2020 CLINICAL DATA:  Left ankle swelling, pain EXAM: LEFT LOWER EXTREMITY VENOUS DOPPLER ULTRASOUND TECHNIQUE: Gray-scale sonography with compression, as well as color and duplex ultrasound, were performed to evaluate the deep venous system(s) from the level of the common femoral vein through the popliteal and proximal calf veins. COMPARISON:  None. FINDINGS: VENOUS Normal compressibility of the common femoral, superficial femoral, and popliteal veins, as well as the visualized calf veins. Visualized portions of profunda femoral vein and great saphenous vein unremarkable. No filling defects to suggest DVT on grayscale or color Doppler imaging. Doppler waveforms show normal direction of venous flow, normal respiratory plasticity and response to augmentation. Limited views of the contralateral common femoral vein are  unremarkable. OTHER None. Limitations: none IMPRESSION: Negative. Electronically Signed   By: Rolm Baptise M.D.   On: 12/14/2020 17:35   DG Hand Complete Right  Result Date: 12/13/2020 CLINICAL DATA:  Hand pain and swelling EXAM: RIGHT HAND - COMPLETE 3+ VIEW COMPARISON:  None. FINDINGS: No fracture or malalignment. Mild arthritis at the first [facility] joint. Soft tissues are unremarkable IMPRESSION: Mild arthritis at the first [facility] joint. Electronically Signed   By: Donavan Foil M.D.   On: 12/13/2020 21:57    ONCOLOGY HISTORY: Patient initially self palpated a lump in the 12 o'clock position of her right breast and subsequently underwent biopsy on September 26, 2018 confirming malignancy.  Initial MammaPrint was reported high risk.  She underwent right lumpectomy on November 21, 2018 which revealed a T2, N1, M0 stage IIa malignancy.  1 of 4 lymph nodes were positive for disease.  She subsequently underwent adjuvant chemotherapy with AC/Taxol completing treatment on Jun 01, 2019.  She then completed adjuvant XRT on August 11, 2019 and was started on tamoxifen.  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  MRI of the brain on August 18, 2020 was negative for metastatic disease.  Patient also underwent second opinion at [facility] in [place].  Agree with recommendation to proceed with Lupron 3.75 mg every 28 days for up to 24 months for ovarian suppression along with an aromatase inhibitor and Ibrance 125 mg for 21 days with 7 days off.  She has been instructed to discontinue tamoxifen.  Repeat PET scan on November 18, 2020 reviewed independently and reported as above with marked improvement of metabolic activity in the left lung, but concern for new lytic lesion at T10 vertebrae and left ribs.  Patient was [facility] seen in [facility] also had second opinion at [facility] and all parties are in agreement to continue current treatment for 2 additional months and then repeat PET scan which is scheduled for January 20, 2021.  MRI of the liver completed on December 12, 2020 reviewed independently with several lesions suspicious for metastatic disease, but too small to characterize.  Can consider repeating in 3 months.  MRI of thoracic spine on December 13, 2020 revealed a likely metastatic lesion in T9 vertebrae.  Despite this, patient's CA 27-29 is trending down and is now 203.6, today's result is pending.  Proceed with Ibrance, Lupron and Zometa as prescribed.  Patient is also taking daily letrozole.  Patient had a video visit on January 22, 2021 after her PET scan to discuss the results.  Case discussed at length with Dr. Samuel Bouche at [facility] Cough/shortness of breath: Improved.  PET scan as above.  Continue Tussionex,  hydrocodone, and albuterol inhaler.  Recent ultrasound did not reveal a significant amount of fluid for thoracentesis.   Anasarca/fluid retention: Resolved.   Pleural abscess: Treated and drained by thoracic surgery in [place].  Resolved.  Chest x-ray and possible thoracentesis as above.   Thrombocytosis: Resolved. Hypercalcemia: Resolved. Transaminitis: Resolved. Pain: Patient was given a prescription for MS Contin 15 mg every 12 hours.  PET scan as above. Oophorectomy: Currently scheduled for February 17, 2021.  Patient will not require additional Lupron after her  surgery.    Patient expressed understanding and was in agreement with this plan. She also understands that She can call clinic at any time with any questions, concerns, or complaints.     Cancer Staging  Malignant neoplasm of upper-outer quadrant of right breast in female, estrogen receptor positive ([place]) Staging form: Breast, AJCC 8th Edition - Clinical stage from 10/05/2018: Stage IB (cT2, cN0, cM0, G2, ER+, PR+, HER2-) - Signed by Chauncey Cruel, MD on 04/02/2020 Stage prefix: Initial diagnosis Histologic grading system: 3 grade system - Pathologic stage from 08/09/2020: No Stage Recommended (ypT2, pN1a, pM1, G2, ER+, PR+, HER2-) - Signed by Lloyd Huger, MD on 08/09/2020 Stage prefix: Post-therapy Multigene prognostic tests performed: MammaPrint Histologic grading system: 3 grade system   Lloyd Huger, MD   01/02/2021 5:40 PM   Addendum: Patient continues to have significant side effects secondary to Zometa including headache and body aches for several days after each infusion.  Switch patient to [facility]   Lloyd Huger, MD 01/24/21 9:03 AM

## 2020-12-31 ENCOUNTER — Telehealth: Payer: Self-pay | Admitting: Emergency Medicine

## 2020-12-31 NOTE — Telephone Encounter (Signed)
Information refaxed to [facility] for testing.

## 2020-12-31 NOTE — Telephone Encounter (Signed)
Called [facility] to follow up on omniseq testing. Was informed that they had spoken with Lincoln Brigham regarding the wrong block being sent for testing causing the test to be cancelled. The block was returned to the facility. Called [facility] and states that he remembers block being returned and they faxed [place] a notice. No fax has been received or scanned into chart. Awaiting further information and how to proceed.

## 2021-01-02 ENCOUNTER — Inpatient Hospital Stay (HOSPITAL_BASED_OUTPATIENT_CLINIC_OR_DEPARTMENT_OTHER): Payer: BC Managed Care – PPO | Admitting: Oncology

## 2021-01-02 ENCOUNTER — Inpatient Hospital Stay: Payer: BC Managed Care – PPO

## 2021-01-02 ENCOUNTER — Other Ambulatory Visit: Payer: Self-pay

## 2021-01-02 ENCOUNTER — Encounter: Payer: Self-pay | Admitting: Oncology

## 2021-01-02 VITALS — BP 119/86 | HR 121 | Temp 97.8°F | Resp 20 | Wt 171.3 lb

## 2021-01-02 DIAGNOSIS — Z170 Estrogen receptor positive status [ER+]: Secondary | ICD-10-CM

## 2021-01-02 DIAGNOSIS — C50411 Malignant neoplasm of upper-outer quadrant of right female breast: Secondary | ICD-10-CM | POA: Diagnosis not present

## 2021-01-02 DIAGNOSIS — Z5111 Encounter for antineoplastic chemotherapy: Secondary | ICD-10-CM | POA: Diagnosis not present

## 2021-01-02 LAB — CBC WITH DIFFERENTIAL/PLATELET
Abs Immature Granulocytes: 0.01 10*3/uL (ref 0.00–0.07)
Basophils Absolute: 0.1 10*3/uL (ref 0.0–0.1)
Basophils Relative: 2 %
Eosinophils Absolute: 0.1 10*3/uL (ref 0.0–0.5)
Eosinophils Relative: 2 %
HCT: 37.8 % (ref 36.0–46.0)
Hemoglobin: 13.1 g/dL (ref 12.0–15.0)
Immature Granulocytes: 0 %
Lymphocytes Relative: 29 %
Lymphs Abs: 1.2 10*3/uL (ref 0.7–4.0)
MCH: 34.7 pg — ABNORMAL HIGH (ref 26.0–34.0)
MCHC: 34.7 g/dL (ref 30.0–36.0)
MCV: 100.3 fL — ABNORMAL HIGH (ref 80.0–100.0)
Monocytes Absolute: 0.6 10*3/uL (ref 0.1–1.0)
Monocytes Relative: 14 %
Neutro Abs: 2.2 10*3/uL (ref 1.7–7.7)
Neutrophils Relative %: 53 %
Platelets: 313 10*3/uL (ref 150–400)
RBC: 3.77 MIL/uL — ABNORMAL LOW (ref 3.87–5.11)
RDW: 13.4 % (ref 11.5–15.5)
WBC: 4.1 10*3/uL (ref 4.0–10.5)
nRBC: 0 % (ref 0.0–0.2)

## 2021-01-02 LAB — COMPREHENSIVE METABOLIC PANEL
ALT: 25 U/L (ref 0–44)
AST: 25 U/L (ref 15–41)
Albumin: 4 g/dL (ref 3.5–5.0)
Alkaline Phosphatase: 74 U/L (ref 38–126)
Anion gap: 10 (ref 5–15)
BUN: 13 mg/dL (ref 6–20)
CO2: 29 mmol/L (ref 22–32)
Calcium: 9.1 mg/dL (ref 8.9–10.3)
Chloride: 98 mmol/L (ref 98–111)
Creatinine, Ser: 0.57 mg/dL (ref 0.44–1.00)
GFR, Estimated: >60 mL/min (ref >60)
Glucose, Bld: 142 mg/dL — ABNORMAL HIGH (ref 70–99)
Potassium: 3.8 mmol/L (ref 3.5–5.1)
Sodium: 137 mmol/L (ref 135–145)
Total Bilirubin: 0.3 mg/dL (ref 0.3–1.2)
Total Protein: 7.6 g/dL (ref 6.5–8.1)

## 2021-01-02 MED ORDER — SODIUM CHLORIDE 0.9 % IV SOLN
INTRAVENOUS | Status: DC
  Filled 2021-01-02: qty 250

## 2021-01-02 MED ORDER — MORPHINE SULFATE ER 15 MG PO TBCR: 15.0000 mg | EXTENDED_RELEASE_TABLET | Freq: Two times a day (BID) | ORAL | 0 refills | Status: DC

## 2021-01-02 MED ORDER — LEUPROLIDE ACETATE 3.75 MG IM KIT
3.7500 mg | PACK | Freq: Once | INTRAMUSCULAR | Status: AC
  Administered 2021-01-02: 16:00:00 3.75 mg via INTRAMUSCULAR
  Filled 2021-01-02: qty 3.75

## 2021-01-02 MED ORDER — ZOLEDRONIC ACID 4 MG/100ML IV SOLN
4.0000 mg | Freq: Once | INTRAVENOUS | Status: AC
  Administered 2021-01-02: 16:00:00 4 mg via INTRAVENOUS
  Filled 2021-01-02: qty 100

## 2021-01-02 NOTE — Patient Instructions (Signed)
Start taking Calcium 1200 mg and vitamin D 2000 iu daily.

## 2021-01-03 LAB — CANCER ANTIGEN 27.29: CA 27.29: 141.6 U/mL — ABNORMAL HIGH (ref 0.0–38.6)

## 2021-01-07 ENCOUNTER — Other Ambulatory Visit: Payer: Self-pay | Admitting: Oncology

## 2021-01-07 ENCOUNTER — Other Ambulatory Visit: Payer: Self-pay | Admitting: Emergency Medicine

## 2021-01-07 DIAGNOSIS — C50911 Malignant neoplasm of unspecified site of right female breast: Secondary | ICD-10-CM

## 2021-01-07 DIAGNOSIS — C50411 Malignant neoplasm of upper-outer quadrant of right female breast: Secondary | ICD-10-CM

## 2021-01-07 DIAGNOSIS — Z170 Estrogen receptor positive status [ER+]: Secondary | ICD-10-CM

## 2021-01-07 MED ORDER — OXYCODONE HCL 5 MG PO TABS: 5.0000 mg | ORAL_TABLET | Freq: Four times a day (QID) | ORAL | 0 refills | Status: DC | PRN

## 2021-01-14 ENCOUNTER — Ambulatory Visit (INDEPENDENT_AMBULATORY_CARE_PROVIDER_SITE_OTHER): Payer: BC Managed Care – PPO | Admitting: Gastroenterology

## 2021-01-14 ENCOUNTER — Encounter: Payer: Self-pay | Admitting: Gastroenterology

## 2021-01-14 ENCOUNTER — Other Ambulatory Visit: Payer: Self-pay

## 2021-01-14 VITALS — BP 159/88 | HR 111 | Temp 98.6°F | Ht 63.0 in | Wt 170.0 lb

## 2021-01-14 DIAGNOSIS — K625 Hemorrhage of anus and rectum: Secondary | ICD-10-CM

## 2021-01-14 MED ORDER — PEG 3350-KCL-NA BICARB-NACL 420 G PO SOLR: 4000.0000 mL | Freq: Once | ORAL | 0 refills | Status: AC

## 2021-01-14 NOTE — Progress Notes (Signed)
Jonathon Bellows MD, MRCP(U.K) [address]  [place], [place] [address]  Main: [phone]  Fax: [phone]   Gastroenterology Consultation  Referring Provider:     Verlon Au, NP Primary Care Physician:  Dion Body, MD Primary Gastroenterologist:  Dr. Jonathon Bellows  Reason for Consultation:    Hemorrhoids         HPI:   DANNELLE Romero is a 41 y.o. y/o female referred for consultation & management  by Dr. Dion Body, MD.    She states she is here today to see me for rectal bleeding.  Has noticed on and off painless rectal bleeding along with the sensation of something popping out of her anus during the process of defecation.  No prior colonoscopy.  No family of colon cancer or polyps.  Has been treated for breast cancer.  Has some anal pruritus.  Consumes a diet very high in fiber and has tried conservative management of internal hemorrhoids but still continues to provide her problems.  Past Medical History:  Diagnosis Date   Anxiety    Family history of breast cancer    HSV (herpes simplex virus) anogenital infection    positive titer only    Past Surgical History:  Procedure Laterality Date   BREAST LUMPECTOMY WITH AXILLARY LYMPH NODE BIOPSY Right 11/21/2018   Procedure: RIGHT BREAST LUMPECTOMY WITH SENTINEL LYMPH NODE BIOPSY;  Surgeon: Jovita Kussmaul, MD;  Location: [facility];  Service: General;  Laterality: Right;   BREAST REDUCTION SURGERY Bilateral 11/21/2018   Procedure: BILATERAL MAMMARY REDUCTION  (BREAST);  Surgeon: Wallace Going, DO;  Location: [facility];  Service: Plastics;  Laterality: Bilateral;   CHEST TUBE INSERTION Left 07/29/2020   Procedure: INSERTION PLEURAL DRAINAGE CATHETER;  Surgeon: Lajuana Matte, MD;  Location: [facility];  Service: Thoracic;  Laterality: Left;   IRRIGATION AND DEBRIDEMENT STERNOCLAVICULAR JOINT-STERNUM AND RIBS N/A 08/13/2020   Procedure: IRRIGATION AND DEBRIDEMENT CHEST WALL ABSCESS;  Surgeon:  Lajuana Matte, MD;  Location: [facility];  Service: Cardiothoracic;  Laterality: N/A;   PLEURAL BIOPSY Left 07/29/2020   Procedure: PLEURAL BIOPSY;  Surgeon: Lajuana Matte, MD;  Location: [facility];  Service: Thoracic;  Laterality: Left;   PLEURAL EFFUSION DRAINAGE Left 07/29/2020   Procedure: DRAINAGE OF PLEURAL EFFUSION;  Surgeon: Lajuana Matte, MD;  Location: [facility];  Service: Thoracic;  Laterality: Left;   PORT-A-CATH REMOVAL N/A 07/06/2019   Procedure: REMOVAL PORT-A-CATH;  Surgeon: Jovita Kussmaul, MD;  Location: [facility];  Service: General;  Laterality: N/A;   PORTACATH PLACEMENT N/A 11/21/2018   Procedure: INSERTION LEFT PORT-A-CATH WITH ULTRASOUND GUIDANCE;  Surgeon: Jovita Kussmaul, MD;  Location: [facility];  Service: General;  Laterality: N/A;   [place] Left 07/29/2020   Procedure: VIDEO ASSISTED THORACOSCOPY;  Surgeon: Lajuana Matte, MD;  Location: [facility];  Service: Thoracic;  Laterality: Left;   WISDOM TOOTH EXTRACTION      Prior to Admission medications   Medication Sig Start Date End Date Taking? Authorizing Provider  acetaminophen (TYLENOL) 325 MG tablet Take 2 tablets (650 mg total) by mouth every 6 (six) hours as needed for mild pain (or Fever >/= 101). 08/15/20   Nicole Kindred A, DO  albuterol (VENTOLIN HFA) 108 (90 Base) MCG/ACT inhaler INHALE 2 PUFFS INTO THE LUNGS EVERY 6 HOURS AS NEEDED FOR WHEEZING OR SHORTNESS OF BREATH 10/15/20  Lloyd Huger, MD  ALPRAZolam Duanne Moron) 0.25 MG tablet Take 1 tablet (0.25 mg total) by mouth 2 (two) times daily as needed for anxiety. 11/21/20   Verlon Au, NP  amphetamine-dextroamphetamine (ADDERALL XR) 30 MG 24 hr capsule Take 30 mg by mouth daily.    [provider]  amphetamine-dextroamphetamine (ADDERALL) 10 MG tablet Take 10 mg by mouth daily in the afternoon.    [provider]  Ascorbic Acid (VITAMIN C) 500 MG CAPS Take  500 mg by mouth daily.    [provider]  chlorpheniramine-HYDROcodone (TUSSIONEX) 10-8 MG/5ML SUER Take 5 mLs by mouth every 12 (twelve) hours as needed for cough. 12/20/20   Borders, Kirt Boys, NP  guanFACINE (INTUNIV) 2 MG TB24 ER tablet Take 2 mg by mouth daily. 01/13/21   [provider]  ibuprofen (ADVIL) 200 MG tablet Take 400 mg by mouth every 6 (six) hours as needed for mild pain.    [provider]  letrozole (FEMARA) 2.5 MG tablet Take 1 tablet (2.5 mg total) by mouth daily. 08/09/20   Lloyd Huger, MD  morphine (MS CONTIN) 15 MG 12 hr tablet Take 1 tablet (15 mg total) by mouth every 12 (twelve) hours. 01/02/21   Lloyd Huger, MD  Multiple Vitamin (MULTIVITAMIN WITH MINERALS) TABS tablet Take 1 tablet by mouth daily.    [provider]  oxyCODONE (OXY IR/ROXICODONE) 5 MG immediate release tablet Take 1 tablet (5 mg total) by mouth every 6 (six) hours as needed for severe pain. 01/07/21   Lloyd Huger, MD  palbociclib (IBRANCE) 125 MG tablet Take 1 tablet (125 mg total) by mouth daily. Take for 21 days on, 7 days off, repeat every 28 days. 10/10/20   Darl Pikes, RPH-CPP  polyethylene glycol (MIRALAX / GLYCOLAX) 17 g packet Take 17 g by mouth daily. 08/15/20   Ezekiel Slocumb, DO  senna-docusate (SENOKOT-S) 8.6-50 MG tablet     [provider]  Tetrahydrozoline HCl (VISINE OP) Place 1 drop into both eyes daily as needed (redness).    [provider]  venlafaxine XR (EFFEXOR-XR) 150 MG 24 hr capsule TAKE 1 CAPSULE(150 MG) BY MOUTH DAILY WITH BREAKFAST 01/07/21   Lloyd Huger, MD  vitamin B-12 (CYANOCOBALAMIN) 1000 MCG tablet Take 1,000 mcg by mouth daily.    [provider]    Family History  Problem Relation Age of Onset   Hypertension Father    Alcohol abuse Paternal Grandfather    Heart disease Paternal Grandfather    Alcohol abuse Maternal Grandfather    Heart disease Maternal Grandmother     Breast cancer Other      Social History   Tobacco Use   Smoking status: Former    Packs/day: 0.50    Years: 20.00    Pack years: 10.00    Types: Cigarettes    Start date: 01/13/2002    Quit date: 10/17/2016    Years since quitting: 4.2   Smokeless tobacco: Never  Vaping Use   Vaping Use: Never used  Substance Use Topics   Alcohol use: Yes    Comment: Socially   Drug use: No    Allergies as of 01/14/2021 - Review Complete 01/14/2021  Allergen Reaction Noted   Betadine [povidone-iodine]  07/29/2020   Povidone iodine Rash 03/23/2019    Review of Systems:    All systems reviewed and negative except where noted in HPI.   Physical Exam:  BP (!) 159/88 (BP Location:  Left Arm, Patient Position: Sitting, Cuff Size: Normal)    Pulse (!) 111    Temp 98.6 F (37 C) (Oral)    Ht 5\' 3"  (1.6 m)    Wt 170 lb (77.1 kg)    BMI 30.11 kg/m  No LMP recorded. (Menstrual status: Chemotherapy). Psych:  Alert and cooperative. Normal mood and affect. General:   Alert,  Well-developed, well-nourished, pleasant and cooperative in NAD Head:  Normocephalic and atraumatic. Eyes:  Sclera clear, no icterus.   Conjunctiva pink. Ears:  Normal auditory acuity.  Neurologic:  Alert and oriented x3;  grossly normal neurologically. Psych:  Alert and cooperative. Normal mood and affect.  Imaging Studies: No results found.  Assessment and Plan:   Kaylee Romero is a 40 y.o. y/o female has been referred for rectal bleeding.  She also has a history of anal pruritus.  Her symptoms are very suggestive of internal hemorrhoids.  Plan 1.  Colonoscopy to evaluate for rectal bleeding or rule out other causes of bleeding. 2.  Continue conservative management of internal hemorrhoids which she states has not worked.  If colonoscopy is negative we will bring him back for banding of internal hemorrhoids.  I have discussed alternative options, risks & benefits,  which include, but are not limited to, bleeding,  infection, perforation,respiratory complication & drug reaction.  The patient agrees with this plan & written consent will be obtained.     Follow up in a few days after her colonoscopy  Dr Jonathon Bellows MD,MRCP(U.K)

## 2021-01-15 ENCOUNTER — Encounter: Payer: Self-pay | Admitting: Oncology

## 2021-01-16 ENCOUNTER — Telehealth: Payer: Self-pay

## 2021-01-16 ENCOUNTER — Encounter: Payer: Self-pay | Admitting: Emergency Medicine

## 2021-01-16 ENCOUNTER — Other Ambulatory Visit: Payer: Self-pay | Admitting: Emergency Medicine

## 2021-01-16 DIAGNOSIS — Z170 Estrogen receptor positive status [ER+]: Secondary | ICD-10-CM

## 2021-01-16 DIAGNOSIS — C50411 Malignant neoplasm of upper-outer quadrant of right female breast: Secondary | ICD-10-CM

## 2021-01-16 MED ORDER — ALPRAZOLAM 0.25 MG PO TABS: 0.2500 mg | ORAL_TABLET | Freq: Two times a day (BID) | ORAL | 0 refills | Status: DC | PRN

## 2021-01-16 NOTE — Telephone Encounter (Signed)
Called [facility] regarding request for omniseq testing. Rep stated specimen was received from [facility] pathology([facility]) however the block didn't have the QNS to run the test. It was sent back and [facility] is aware. They would need to send appropriate specimen. Contracted [facility] who transferred to supervisor. No answer left voicemail. Called again rep unable to provide information. They will call RN back as soon as information is found. MD aware.

## 2021-01-16 NOTE — Telephone Encounter (Signed)
Spoke with [facility] Pathology who stated the specimen block was sent and agreed by Dr. Melina Copa that this was the appropriate specimen to send to [facility].  Conference call with [facility] who stated that there was no tumor tissue to test and it will be canceled since the appropriate block was not received. [facility] stated all testing was canceled and they will send back to pathology. Charge RN aware and will look into situation.

## 2021-01-17 ENCOUNTER — Other Ambulatory Visit: Payer: Self-pay | Admitting: Emergency Medicine

## 2021-01-17 ENCOUNTER — Encounter: Payer: Self-pay | Admitting: Oncology

## 2021-01-17 ENCOUNTER — Telehealth: Payer: Self-pay | Admitting: Emergency Medicine

## 2021-01-17 DIAGNOSIS — C50411 Malignant neoplasm of upper-outer quadrant of right female breast: Secondary | ICD-10-CM

## 2021-01-17 DIAGNOSIS — Z170 Estrogen receptor positive status [ER+]: Secondary | ICD-10-CM

## 2021-01-17 MED ORDER — ALPRAZOLAM 0.25 MG PO TABS: 0.2500 mg | ORAL_TABLET | Freq: Two times a day (BID) | ORAL | 0 refills | Status: DC | PRN

## 2021-01-17 NOTE — Telephone Encounter (Signed)
Refaxed information for omniseq testing. Sent path report from 07/29/2020 as well as path report from 11/21/2018 with instructions to attempt testing with 07/29/2020 specimen. If unsuccessful, attempt with 11/21/2018 specimen. Instructions provided per Dr. Gary Fleet request.

## 2021-01-20 ENCOUNTER — Ambulatory Visit: Admission: RE | Admit: 2021-01-20 | Payer: BC Managed Care – PPO | Source: Ambulatory Visit

## 2021-01-20 ENCOUNTER — Other Ambulatory Visit: Payer: Self-pay | Admitting: Emergency Medicine

## 2021-01-20 DIAGNOSIS — C50911 Malignant neoplasm of unspecified site of right female breast: Secondary | ICD-10-CM

## 2021-01-20 MED ORDER — OXYCODONE HCL 5 MG PO TABS: 5.0000 mg | ORAL_TABLET | Freq: Four times a day (QID) | ORAL | 0 refills | Status: DC | PRN

## 2021-01-21 ENCOUNTER — Other Ambulatory Visit (HOSPITAL_COMMUNITY): Payer: Self-pay

## 2021-01-21 ENCOUNTER — Encounter: Payer: Self-pay | Admitting: Oncology

## 2021-01-21 NOTE — Progress Notes (Deleted)
[facility]  Telephone:([phone] Fax:(336) [phone]  ID: Kaylee Romero OB: [birth year]  MR#: [id number]  KXF#:[id number]  Patient Care Team: Dion Body, MD as PCP - General (Family Medicine) Gery Pray, MD as Consulting Physician (Radiation Oncology) Jovita Kussmaul, MD as Consulting Physician (General Surgery) Dillingham, Loel Lofty, DO as Attending Physician (Plastic Surgery) Lloyd Huger, MD as Consulting Physician (Oncology)  I connected with Kaylee Romero on 01/21/21 at 10:30 AM EST by {Blank single:19197::"video enabled telemedicine visit","telephone visit"} and verified that I am speaking with the correct person using two identifiers.   I discussed the limitations, risks, security and privacy concerns of performing an evaluation and management service by telemedicine and the availability of in-person appointments. I also discussed with the patient that there may be a patient responsible charge related to this service. The patient expressed understanding and agreed to proceed.   Other persons participating in the visit and their role in the encounter: Patient, MD.  Patients location: Home. Providers location: Clinic.  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  INTERVAL HISTORY: Patient returns to clinic today for further evaluation and continuation of Lupron and Zometa.  She has left-sided chest/flank pain that is new over the past week, but otherwise feels well.  She does not complain of shortness of breath today. She has a mild intermittent neuropathy, but no other neurologic complaints.  She has a good appetite.  She denies any cough or hemoptysis. She denies any nausea, vomiting, constipation, or diarrhea.  She has no urinary complaints.  Patient offers no further complaints today.  REVIEW OF SYSTEMS:   Review of Systems  Constitutional:  Negative.  Negative for fever, malaise/fatigue and weight loss.  Respiratory: Negative.  Negative for cough, hemoptysis and shortness of breath.   Cardiovascular:  Positive for chest pain. Negative for leg swelling.  Gastrointestinal: Negative.  Negative for abdominal pain.  Genitourinary:  Positive for flank pain. Negative for dysuria.  Musculoskeletal:  Negative for back pain.  Skin: Negative.  Negative for rash.  Neurological:  Positive for tingling. Negative for dizziness, focal weakness, weakness and headaches.  Psychiatric/Behavioral:  The patient is nervous/anxious. The patient does not have insomnia.    As per HPI. Otherwise, a complete review of systems is negative.  PAST MEDICAL HISTORY: Past Medical History:  Diagnosis Date   Anxiety    Family history of breast cancer    HSV (herpes simplex virus) anogenital infection    positive titer only    PAST SURGICAL HISTORY: Past Surgical History:  Procedure Laterality Date   BREAST LUMPECTOMY WITH AXILLARY LYMPH NODE BIOPSY Right 11/21/2018   Procedure: RIGHT BREAST LUMPECTOMY WITH SENTINEL LYMPH NODE BIOPSY;  Surgeon: Jovita Kussmaul, MD;  Location: [facility];  Service: General;  Laterality: Right;   BREAST REDUCTION SURGERY Bilateral 11/21/2018   Procedure: BILATERAL MAMMARY REDUCTION  (BREAST);  Surgeon: Wallace Going, DO;  Location: [facility];  Service: Plastics;  Laterality: Bilateral;   CHEST TUBE INSERTION Left 07/29/2020   Procedure: INSERTION PLEURAL DRAINAGE CATHETER;  Surgeon: Lajuana Matte, MD;  Location: [facility];  Service: Thoracic;  Laterality: Left;   IRRIGATION AND DEBRIDEMENT STERNOCLAVICULAR JOINT-STERNUM AND RIBS N/A 08/13/2020   Procedure: IRRIGATION AND DEBRIDEMENT CHEST WALL ABSCESS;  Surgeon: Lajuana Matte, MD;  Location: [facility];  Service: Cardiothoracic;  Laterality: N/A;   PLEURAL BIOPSY Left 07/29/2020   Procedure: PLEURAL BIOPSY;  Surgeon: Lajuana Matte, MD;  Location: [facility];  Service:  Thoracic;  Laterality: Left;   PLEURAL EFFUSION DRAINAGE Left 07/29/2020   Procedure: DRAINAGE OF PLEURAL EFFUSION;  Surgeon: Lajuana Matte, MD;  Location: [facility];  Service: Thoracic;  Laterality: Left;   PORT-A-CATH REMOVAL N/A 07/06/2019   Procedure: REMOVAL PORT-A-CATH;  Surgeon: Jovita Kussmaul, MD;  Location: [facility];  Service: General;  Laterality: N/A;   PORTACATH PLACEMENT N/A 11/21/2018   Procedure: INSERTION LEFT PORT-A-CATH WITH ULTRASOUND GUIDANCE;  Surgeon: Jovita Kussmaul, MD;  Location: [facility];  Service: General;  Laterality: N/A;   TONSILLECTOMY  1987   TONSILLECTOMY     VIDEO ASSISTED THORACOSCOPY Left 07/29/2020   Procedure: VIDEO ASSISTED THORACOSCOPY;  Surgeon: Lajuana Matte, MD;  [facility];  Service: Thoracic;  Laterality: Left;   WISDOM TOOTH EXTRACTION      FAMILY HISTORY: Family History  Problem Relation Age of Onset   Hypertension Father    Alcohol abuse Paternal Grandfather    Heart disease Paternal Grandfather    Alcohol abuse Maternal Grandfather    Heart disease Maternal Grandmother    Breast cancer Other     ADVANCED DIRECTIVES (Y/N):  N  HEALTH MAINTENANCE: Social History   Tobacco Use   Smoking status: Former    Packs/day: 0.50    Years: 20.00    Pack years: 10.00    Types: Cigarettes    Start date: 01/13/2002    Quit date: 10/17/2016    Years since quitting: 4.2   Smokeless tobacco: Never  Vaping Use   Vaping Use: Never used  Substance Use Topics   Alcohol use: Yes    Comment: Socially   Drug use: No     Colonoscopy:  PAP:  Bone density:  Lipid panel:  Allergies  Allergen Reactions   Betadine [Povidone-Iodine]    Povidone Iodine Rash    Current Outpatient Medications  Medication Sig Dispense Refill   acetaminophen (TYLENOL) 325 MG tablet Take 2 tablets (650 mg total) by mouth every 6 (six) hours as needed for mild pain (or Fever >/= 101).     albuterol (VENTOLIN HFA) 108 (90 Base) MCG/ACT  inhaler INHALE 2 PUFFS INTO THE LUNGS EVERY 6 HOURS AS NEEDED FOR WHEEZING OR SHORTNESS OF BREATH 6.7 g 2   ALPRAZolam (XANAX) 0.25 MG tablet Take 1 tablet (0.25 mg total) by mouth 2 (two) times daily as needed for anxiety. 60 tablet 0   amphetamine-dextroamphetamine (ADDERALL XR) 30 MG 24 hr capsule Take 30 mg by mouth daily.     amphetamine-dextroamphetamine (ADDERALL) 10 MG tablet Take 10 mg by mouth daily in the afternoon.     Ascorbic Acid (VITAMIN C) 500 MG CAPS Take 500 mg by mouth daily.     chlorpheniramine-HYDROcodone (TUSSIONEX) 10-8 MG/5ML SUER Take 5 mLs by mouth every 12 (twelve) hours as needed for cough. 140 mL 0   guanFACINE (INTUNIV) 2 MG TB24 ER tablet Take 2 mg by mouth daily.     ibuprofen (ADVIL) 200 MG tablet Take 400 mg by mouth every 6 (six) hours as needed for mild pain.     letrozole (FEMARA) 2.5 MG tablet Take 1 tablet (2.5 mg total) by mouth daily. 90 tablet 3   morphine (MS CONTIN) 15 MG 12 hr tablet Take 1 tablet (15 mg total) by mouth every 12 (twelve) hours. 60 tablet 0   Multiple Vitamin (MULTIVITAMIN WITH MINERALS) TABS tablet Take 1 tablet by mouth daily.  oxyCODONE (OXY IR/ROXICODONE) 5 MG immediate release tablet Take 1 tablet (5 mg total) by mouth every 6 (six) hours as needed for severe pain. 90 tablet 0   palbociclib (IBRANCE) 125 MG tablet Take 1 tablet (125 mg total) by mouth daily. Take for 21 days on, 7 days off, repeat every 28 days. 21 tablet 2   polyethylene glycol (MIRALAX / GLYCOLAX) 17 g packet Take 17 g by mouth daily. 14 each 0   senna-docusate (SENOKOT-S) 8.6-50 MG tablet      Tetrahydrozoline HCl (VISINE OP) Place 1 drop into both eyes daily as needed (redness).     venlafaxine XR (EFFEXOR-XR) 150 MG 24 hr capsule TAKE 1 CAPSULE(150 MG) BY MOUTH DAILY WITH BREAKFAST 30 capsule 1   vitamin B-12 (CYANOCOBALAMIN) 1000 MCG tablet Take 1,000 mcg by mouth daily.     No current facility-administered medications for this visit.     OBJECTIVE: There were no vitals filed for this visit.      There is no height or weight on file to calculate BMI.    ECOG FS:0 - Asymptomatic  General: Well-developed, well-nourished, no acute distress. Eyes: Pink conjunctiva, anicteric sclera. HEENT: Normocephalic, moist mucous membranes. Lungs: No audible wheezing or coughing. Chest wall: Nontender. Heart: Regular rate and rhythm. Abdomen: Soft, nontender, no obvious distention. Musculoskeletal: No edema, cyanosis, or clubbing. Neuro: Alert, answering all questions appropriately. Cranial nerves grossly intact. Skin: No rashes or petechiae noted. Psych: Normal affect.   LAB RESULTS:  Lab Results  Component Value Date   NA 137 01/02/2021   K 3.8 01/02/2021   CL 98 01/02/2021   CO2 29 01/02/2021   GLUCOSE 142 (H) 01/02/2021   BUN 13 01/02/2021   CREATININE 0.57 01/02/2021   CALCIUM 9.1 01/02/2021   PROT 7.6 01/02/2021   ALBUMIN 4.0 01/02/2021   AST 25 01/02/2021   ALT 25 01/02/2021   ALKPHOS 74 01/02/2021   BILITOT 0.3 01/02/2021   GFRNONAA >60 01/02/2021   GFRAA >60 08/07/2019    Lab Results  Component Value Date   WBC 4.1 01/02/2021   NEUTROABS 2.2 01/02/2021   HGB 13.1 01/02/2021   HCT 37.8 01/02/2021   MCV 100.3 (H) 01/02/2021   PLT 313 01/02/2021     STUDIES: No results found.  ONCOLOGY HISTORY: Patient initially self palpated a lump in the 12 o'clock position of her right breast and subsequently underwent biopsy on September 26, 2018 confirming malignancy.  Initial MammaPrint was reported high risk.  She underwent right lumpectomy on November 21, 2018 which revealed a T2, N1, M0 stage IIa malignancy.  1 of 4 lymph nodes were positive for disease.  She subsequently underwent adjuvant chemotherapy with AC/Taxol completing treatment on Jun 01, 2019.  She then completed adjuvant XRT on August 11, 2019 and was started on tamoxifen.  ASSESSMENT: Recurrent, stage IV ER/PR positive, HER2 negative invasive  carcinoma of the breast with metastasis to the lung pleura.  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  MRI of the brain on August 18, 2020 was negative for metastatic disease.  Patient also underwent second opinion at [facility] in [place].  Agree with recommendation to proceed with Lupron 3.75 mg every 28 days for up to 24 months for ovarian suppression along with an aromatase inhibitor and Ibrance 125  mg for 21 days with 7 days off.  She has been instructed to discontinue tamoxifen.  Repeat PET scan on November 18, 2020 reviewed independently and reported as above with marked improvement of metabolic activity in the left lung, but concern for new lytic lesion at T10 vertebrae and left ribs.  Patient was once again seen in [facility] also had second opinion at [facility] and all parties are in agreement to continue current treatment for 2 additional months and then repeat PET scan which is scheduled for January 20, 2021.  MRI of the liver completed on December 12, 2020 reviewed independently with several lesions suspicious for metastatic disease, but too small to characterize.  Can consider repeating in 3 months.  MRI of thoracic spine on December 13, 2020 revealed a likely metastatic lesion in T9 vertebrae.  Despite this, patient's CA 27-29 is trending down and is now 203.6, today's result is pending.  Proceed with Ibrance, Lupron and Zometa as prescribed.  Patient is also taking daily letrozole.  Patient had a video visit on January 22, 2021 after her PET scan to discuss the results.  Case discussed at length with Dr. Samuel Bouche at [facility]. Cough/shortness of breath: Improved.  PET scan as above.  Continue Tussionex, hydrocodone, and albuterol inhaler.  Recent ultrasound did not  reveal a significant amount of fluid for thoracentesis.   Anasarca/fluid retention: Resolved.   Pleural abscess: Treated and drained by thoracic surgery in [place].  Resolved.  Chest x-ray and possible thoracentesis as above.   Thrombocytosis: Resolved. Hypercalcemia: Resolved. Transaminitis: Resolved. Pain: Patient was given a prescription for MS Contin 15 mg every 12 hours.  PET scan as above. Oophorectomy: Currently scheduled for February 17, 2021.  Patient will not require additional Lupron after her surgery.  I provided *** minutes of {Blank single:19197::"face-to-face video visit time","non face-to-face telephone visit time"} during this encounter which included chart review, counseling, and coordination of care as documented above.   Patient expressed understanding and was in agreement with this plan. She also understands that She can call clinic at any time with any questions, concerns, or complaints.     Cancer Staging  Malignant neoplasm of upper-outer quadrant of right breast in female, estrogen receptor positive ([place]) Staging form: Breast, AJCC 8th Edition - Clinical stage from 10/05/2018: Stage IB (cT2, cN0, cM0, G2, ER+, PR+, HER2-) - Signed by Chauncey Cruel, MD on 04/02/2020 Stage prefix: Initial diagnosis Histologic grading system: 3 grade system - Pathologic stage from 08/09/2020: No Stage Recommended (ypT2, pN1a, pM1, G2, ER+, PR+, HER2-) - Signed by Lloyd Huger, MD on 08/09/2020 Stage prefix: Post-therapy Multigene prognostic tests performed: MammaPrint Histologic grading system: 3 grade system   Lloyd Huger, MD   01/21/2021 6:05 AM

## 2021-01-22 ENCOUNTER — Other Ambulatory Visit: Payer: Self-pay | Admitting: Pharmacist

## 2021-01-22 ENCOUNTER — Other Ambulatory Visit (HOSPITAL_COMMUNITY): Payer: Self-pay

## 2021-01-22 ENCOUNTER — Inpatient Hospital Stay: Payer: BC Managed Care – PPO | Admitting: Oncology

## 2021-01-22 DIAGNOSIS — C50411 Malignant neoplasm of upper-outer quadrant of right female breast: Secondary | ICD-10-CM

## 2021-01-22 DIAGNOSIS — Z170 Estrogen receptor positive status [ER+]: Secondary | ICD-10-CM

## 2021-01-23 ENCOUNTER — Other Ambulatory Visit: Payer: Self-pay | Admitting: Emergency Medicine

## 2021-01-23 DIAGNOSIS — Z170 Estrogen receptor positive status [ER+]: Secondary | ICD-10-CM

## 2021-01-23 NOTE — Addendum Note (Signed)
Addended by: Wilford Corner on: 01/23/2021 02:03 PM   Modules accepted: Orders

## 2021-01-23 NOTE — Progress Notes (Signed)
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  INTERVAL HISTORY: Patient returns to clinic today for further evaluation, discussion of her imaging results, and treatment planning.  She continues to have significant back and left-sided chest/flank pain.  She does not complain of shortness of breath today. She has a mild intermittent neuropathy, but no other neurologic complaints.  She has a good appetite.  She denies any cough or hemoptysis. She denies any nausea, vomiting, constipation, or diarrhea.  She has no urinary complaints.  Patient offers no further specific complaints today.  REVIEW OF SYSTEMS:   Review of Systems  Constitutional: Negative.  Negative for fever, malaise/fatigue and weight loss.  Respiratory: Negative.  Negative for cough, hemoptysis and shortness of breath.   Cardiovascular:  Positive for chest pain. Negative for leg swelling.  Gastrointestinal: Negative.  Negative for abdominal pain.  Genitourinary:  Positive for flank pain. Negative for dysuria.  Musculoskeletal:  Negative for back pain.  Skin: Negative.  Negative for rash.  Neurological:  Positive for tingling. Negative for dizziness, focal weakness, weakness and headaches.  Psychiatric/Behavioral:  The patient is nervous/anxious. The patient does not have insomnia.    As per HPI. Otherwise, a complete review of systems is negative.  PAST MEDICAL  HISTORY: Past Medical History:  Diagnosis Date   Anxiety    Family history of breast cancer    HSV (herpes simplex virus) anogenital infection    positive titer only    PAST SURGICAL HISTORY: Past Surgical History:  Procedure Laterality Date   BREAST LUMPECTOMY WITH AXILLARY LYMPH NODE BIOPSY Right 11/21/2018   Procedure: RIGHT BREAST LUMPECTOMY WITH SENTINEL LYMPH NODE BIOPSY;  Surgeon: Griselda Miner, MD;  Location: [facility] OR;  Service: General;  Laterality: Right;   BREAST REDUCTION SURGERY Bilateral 11/21/2018   Procedure: BILATERAL MAMMARY REDUCTION  (BREAST);  Surgeon: Peggye Form, DO;  Location: [facility] OR;  Service: Plastics;  Laterality: Bilateral;   CHEST TUBE INSERTION Left 07/29/2020   Procedure: INSERTION PLEURAL DRAINAGE CATHETER;  Surgeon: Corliss Skains, MD;  Location: [facility] OR;  Service: Thoracic;  Laterality: Left;   IRRIGATION AND DEBRIDEMENT STERNOCLAVICULAR JOINT-STERNUM AND RIBS N/A 08/13/2020   Procedure: IRRIGATION AND DEBRIDEMENT CHEST WALL ABSCESS;  Surgeon: Corliss Skains, MD;  Location: [facility] OR;  Service: Cardiothoracic;  Laterality: N/A;   PLEURAL BIOPSY Left 07/29/2020   Procedure: PLEURAL BIOPSY;  Surgeon: Corliss Skains, MD;  Location: [facility] OR;  Service: Thoracic;  Laterality: Left;   PLEURAL EFFUSION DRAINAGE Left 07/29/2020   Procedure: DRAINAGE OF PLEURAL EFFUSION;  Surgeon: Corliss Skains, MD;  Location: [facility] OR;  Service: Thoracic;  Laterality: Left;   PORT-A-CATH REMOVAL N/A 07/06/2019   Procedure: REMOVAL PORT-A-CATH;  Surgeon: Griselda Miner, MD;  Location: [facility];  Service: General;  Laterality: N/A;   PORTACATH PLACEMENT N/A 11/21/2018   Procedure: INSERTION LEFT PORT-A-CATH WITH ULTRASOUND GUIDANCE;  Surgeon: Griselda Miner, MD;  Location: [facility] OR;  Service: General;  Laterality: N/A;   TONSILLECTOMY  1987  TONSILLECTOMY     VIDEO ASSISTED THORACOSCOPY Left 07/29/2020   Procedure: VIDEO ASSISTED THORACOSCOPY;  Surgeon:  Lajuana Matte, MD;  Location: [facility] OR;  Service: Thoracic;  Laterality: Left;   WISDOM TOOTH EXTRACTION      FAMILY HISTORY: Family History  Problem Relation Age of Onset   Hypertension Father    Alcohol abuse Paternal Grandfather    Heart disease Paternal Grandfather    Alcohol abuse Maternal Grandfather    Heart disease Maternal Grandmother    Breast cancer Other     ADVANCED DIRECTIVES (Y/N):  N  HEALTH MAINTENANCE: Social History   Tobacco Use   Smoking status: Former    Packs/day: 0.50    Years: 20.00    Pack years: 10.00    Types: Cigarettes    Start date: 01/13/2002    Quit date: 10/17/2016    Years since quitting: 4.2   Smokeless tobacco: Never  Vaping Use   Vaping Use: Never used  Substance Use Topics   Alcohol use: Yes    Comment: Socially   Drug use: No     Colonoscopy:  PAP:  Bone density:  Lipid panel:  Allergies  Allergen Reactions   Betadine [Povidone-Iodine]    Povidone Iodine Rash    Current Outpatient Medications  Medication Sig Dispense Refill   acetaminophen (TYLENOL) 325 MG tablet Take 2 tablets (650 mg total) by mouth every 6 (six) hours as needed for mild pain (or Fever >/= 101).     albuterol (VENTOLIN HFA) 108 (90 Base) MCG/ACT inhaler INHALE 2 PUFFS INTO THE LUNGS EVERY 6 HOURS AS NEEDED FOR WHEEZING OR SHORTNESS OF BREATH 6.7 g 2   ALPRAZolam (XANAX) 0.25 MG tablet Take 1 tablet (0.25 mg total) by mouth 2 (two) times daily as needed for anxiety. 60 tablet 0   amphetamine-dextroamphetamine (ADDERALL XR) 30 MG 24 hr capsule Take 30 mg by mouth daily.     Ascorbic Acid (VITAMIN C) 500 MG CAPS Take 500 mg by mouth daily.     chlorpheniramine-HYDROcodone (TUSSIONEX) 10-8 MG/5ML SUER Take 5 mLs by mouth every 12 (twelve) hours as needed for cough. 140 mL 0   guanFACINE (INTUNIV) 2 MG TB24 ER tablet Take 2 mg by mouth daily.     ibuprofen (ADVIL) 200 MG tablet Take 400 mg by mouth every 6 (six) hours as needed for mild pain.      Multiple Vitamin (MULTIVITAMIN WITH MINERALS) TABS tablet Take 1 tablet by mouth daily.     oxyCODONE (OXY IR/ROXICODONE) 5 MG immediate release tablet Take 1 tablet (5 mg total) by mouth every 6 (six) hours as needed for severe pain. 90 tablet 0   polyethylene glycol (MIRALAX / GLYCOLAX) 17 g packet Take 17 g by mouth daily. 14 each 0   senna-docusate (SENOKOT-S) 8.6-50 MG tablet      Tetrahydrozoline HCl (VISINE OP) Place 1 drop into Romero eyes daily as needed (redness).     venlafaxine XR (EFFEXOR-XR) 150 MG 24 hr capsule TAKE 1 CAPSULE(150 MG) BY MOUTH DAILY WITH BREAKFAST 30 capsule 1   vitamin B-12 (CYANOCOBALAMIN) 1000 MCG tablet Take 1,000 mcg by mouth daily.     amphetamine-dextroamphetamine (ADDERALL) 10 MG tablet Take 10 mg by mouth daily in the afternoon. (Patient not taking: Reported on 01/30/2021)     capecitabine (XELODA) 500 MG tablet Take 4 tablets (2,000 mg total) by mouth 2 (two) times daily after a meal. Take for 14 days, then hold for 7 days. Repeat every  21 days. 112 tablet 1   morphine (MS CONTIN) 15 MG 12 hr tablet Take 2 tablets (30 mg total) by mouth every 8 (eight) hours. 90 tablet 0   ondansetron (ZOFRAN) 8 MG tablet Take 1 tablet (8 mg total) by mouth every 8 (eight) hours as needed for nausea or vomiting. 30 tablet 1   No current facility-administered medications for this visit.    OBJECTIVE: Vitals:   01/30/21 1022  BP: (!) 134/92  Pulse: (!) 105  Resp: 16  Temp: 98.2 F (36.8 C)  SpO2: 100%       Body mass index is 30.11 kg/m.    ECOG FS:0 - Asymptomatic  General: Well-developed, well-nourished, no acute distress. Eyes: Pink conjunctiva, anicteric sclera. HEENT: Normocephalic, moist mucous membranes. Lungs: No audible wheezing or coughing. Heart: Regular rate and rhythm. Abdomen: Soft, nontender, no obvious distention. Musculoskeletal: No edema, cyanosis, or clubbing. Neuro: Alert, answering all questions appropriately. Cranial nerves grossly  intact. Skin: No rashes or petechiae noted. Psych: Normal affect.  LAB RESULTS:  Lab Results  Component Value Date   NA 137 01/30/2021   K 4.4 01/30/2021   CL 97 (L) 01/30/2021   CO2 29 01/30/2021   GLUCOSE 100 (H) 01/30/2021   BUN 15 01/30/2021   CREATININE 0.55 01/30/2021   CALCIUM 9.5 01/30/2021   PROT 7.9 01/30/2021   ALBUMIN 4.2 01/30/2021   AST 31 01/30/2021   ALT 28 01/30/2021   ALKPHOS 68 01/30/2021   BILITOT 0.3 01/30/2021   GFRNONAA >60 01/30/2021   GFRAA >60 08/07/2019    Lab Results  Component Value Date   WBC 3.9 (L) 01/30/2021   NEUTROABS 2.1 01/30/2021   HGB 13.1 01/30/2021   HCT 37.4 01/30/2021   MCV 100.0 01/30/2021   PLT 257 01/30/2021     STUDIES: CT CHEST ABDOMEN PELVIS W CONTRAST  Result Date: 01/27/2021 CLINICAL DATA:  A 41 year old female presents for evaluation/follow-up of breast cancer in a LEFT side and mid back pain radiating to the front. EXAM: CT CHEST, ABDOMEN, AND PELVIS WITH CONTRAST TECHNIQUE: Multidetector CT imaging of the chest, abdomen and pelvis was performed following the standard protocol during bolus administration of intravenous contrast. RADIATION DOSE REDUCTION: This exam was performed according to the departmental dose-optimization program which includes automated exposure control, adjustment of the mA and/or kV according to patient size and/or use of iterative reconstruction technique. CONTRAST:  36mL OMNIPAQUE IOHEXOL 300 MG/ML  SOLN COMPARISON:  November 12, 2020. FINDINGS: CT CHEST FINDINGS Cardiovascular: Pericardial nodularity along the LEFT heart border and thickening along the LEFT mediastinal border is similar about the LEFT heart but increased about the superior mediastinum, see below. The aorta is normal caliber. Central pulmonary vasculature with normal caliber. Mediastinum/Nodes: Nodularity along the LEFT mediastinal border with increased, measuring 17 mm greatest thickness (image 23/2) at this same location previously  7 mm. Internal mammary lymph node/nodularity along internal mammary vessels (image 23/2) 15 mm greatest thickness, previously approximately 5-6 mm, increasingly solid since the prior study. Scattered small supraclavicular lymph nodes do not meet pathologic size criteria and are unchanged on the LEFT when compared to the previous study. LEFT retropectoral lymph nodes also similarly stable, for example a lymph node in the LEFT retropectoral region 7 mm (image 17/2) Changes of RIGHT axillary dissection and associated soft tissue distortion with similar appearance. Lungs/Pleura: Increasing pleural thickening in the LEFT chest and nodular soft tissue associated with the LEFT hemidiaphragm, for instance (image 49/2) thickening in the LEFT dependent, posterior  pleural space and nodularity affecting the LEFT hemidiaphragm. New nodule along the under surface of the LEFT hemidiaphragm approximately 15 mm greatest thickness, thickening in the posterior LEFT pleural space approximately 17 mm greatest thickness previously 11 mm. Further volume loss in the inferior LEFT chest. Soft tissue in the anterior aspect of the inferior costodiaphragmatic recess (image 42/2) 17 18 mm greatest thickness with more [employer] appearance previously more fluid density in this area. Discrete nodule along the pleural surface in the dependent LEFT chest in the LEFT lower lobe (image 71/4) 15 mm. This is enlarged, previously in the 2-3 mm range in terms of greatest thickness within enlarging 6 mm nodule along the pleural surface on image 61/4 previously 2-3 mm. Parenchymal nodule in the superior segment of the LEFT lower lobe measures 4 mm previously 2 mm (image 42/4) confluent soft tissue contiguous with consolidative changes with subjective increase may reflect worsening of disease superimposed on rounded atelectasis (image 40/2) this measures 2.6 cm greatest axial dimension Scattered small pulmonary nodules in the RIGHT chest are similar  to prior imaging. Dominant nodule in the RIGHT lower lobe at approximately 6-7 mm (image 90/4). Infiltration of the extrapleural fat abuts the aorta on image 43 of series 2 and contacts the anterior aspect of the T11 and T12 vertebral bodies. Musculoskeletal: See below for full musculoskeletal details. Skin thickening along the anterior RIGHT breast and soft tissue nodularity with in the RIGHT breast parenchyma with similar appearance to prior imaging. CT ABDOMEN PELVIS FINDINGS Hepatobiliary: New area of low attenuation in the RIGHT hepatic lobe (image 49/2) 18 mm greatest axial dimension, not present previously. Similar appearing area of low attenuation in the RIGHT hepatic lobe along the boundary of medial segment LEFT and medial RIGHT hepatic lobe (image 59/2) 21 mm. Stable area of low attenuation in the periphery of the RIGHT hepatic lobe (image 54/2) the portal vein is patent. No pericholecystic stranding. No biliary duct dilation. Pancreas: Normal, without mass, inflammation or ductal dilatation. Spleen: Normal. Adrenals/Urinary Tract: The LEFT adrenal is in close proximity to nodularity along the surface of the LEFT hemidiaphragm now more prominent on the abdominal side of the diaphragm (image 55/2) 8 mm nodule contacts the LEFT adrenal gland. RIGHT adrenal gland is normal. Kidneys with normal CT appearance, no signs of hydronephrosis. No perivesical stranding. No suspicious renal lesion. Stomach/Bowel: Small hiatal hernia. No perigastric stranding or focal gastric thickening to the extent evaluated by CT. No signs of bowel obstruction or acute bowel process normal appendix. Vascular/Lymphatic: Aorta with smooth contours. IVC with smooth contours. No aneurysmal dilation of the abdominal aorta. There is no gastrohepatic or hepatoduodenal ligament lymphadenopathy. No retroperitoneal or mesenteric lymphadenopathy. No pelvic sidewall lymphadenopathy. Reproductive: IUD in situ.  No adnexal mass. Other: No  ascites.  No free air. Musculoskeletal: Scattered sclerotic foci in the pelvis, for instance on image 82/2) this measures 6 mm and is unchanged likewise other small sclerotic foci in the pelvis are similarly without change. Scattered sclerotic foci in the spine, for instance at the L4 level on the LEFT (image 77/2) unchanged. Lytic lesion along the LEFT lateral aspect of the T10 vertebral body 10 mm previously with sclerotic border, no longer with sclerotic border, previously 8 mm. Multiple areas of fracture and rib destruction involving LEFT-sided ribs, overlapping of LEFT seventh rib is similar to the prior exam. There is further sclerosis of these areas when compared to previous imaging involvement of multiple ribs along the LEFT chest is otherwise unchanged  IMPRESSION: 1. Worsening of disease in the chest with increasing pleural thickening and LEFT hemidiaphragmatic nodularity Romero above and below the LEFT hemidiaphragm. 2. Increasing size of parenchymal nodules in the LEFT chest abutting the pleural surface with stable appearance of RIGHT-sided pulmonary nodules. 3. Worsening of hepatic metastatic disease. 4. Nodularity along the under surface of the LEFT hemidiaphragm contacts the LEFT adrenal gland. 5. Similar appearance of destructive changes and pathologic fracture of LEFT-sided ribs in terms of lytic changes and areas of fracture. Increasing sclerosis in this area is the only appreciable change. 6. Increasing size of lytic focus in T10 and unchanged appearance of sclerotic lesions in the pelvis and spine. 7. Aortic atherosclerosis. Aortic Atherosclerosis (ICD10-I70.0). Electronically Signed   By: Zetta Bills M.D.   On: 01/27/2021 16:35    ONCOLOGY HISTORY: Patient initially self palpated a lump in the 12 o'clock position of her right breast and subsequently underwent biopsy on September 26, 2018 confirming malignancy.  Initial MammaPrint was reported high risk.  She underwent right lumpectomy on  November 21, 2018 which revealed a T2, N1, M0 stage IIa malignancy.  1 of 4 lymph nodes were positive for disease.  She subsequently underwent adjuvant chemotherapy with AC/Taxol completing treatment on Jun 01, 2019.  She then completed adjuvant XRT on August 11, 2019 and was started on tamoxifen.  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  MRI of the brain on August 18, 2020 was negative for metastatic disease.  Patient also underwent second opinion at [facility] in [place].  MRI of thoracic spine on December 13, 2020 revealed a likely metastatic lesion in T9 vertebrae.  Despite the fact that her CA 27-29 is trending down, recent CT scan revealed progression of disease in her liver and possibly her lungs.  PET scan was once again denied by insurance.  Case discussed with Dr. Samuel Bouche at [facility] is agreed upon to discontinue current treatment and proceed with Xeloda for 14 days on and 7 days off.  We will repeat CT scan in 2 months.  Patient could not tolerate Zometa, therefore we will proceed with Xgeva today.  Return to clinic in 4 weeks for further evaluation and continuation of treatment. Cough/shortness of breath: Improved.  Continue Tussionex, hydrocodone, and albuterol inhaler.  Recent ultrasound did not reveal a significant amount of fluid for thoracentesis.   Anasarca/fluid retention: Resolved.   Pleural abscess: Treated and drained by thoracic surgery in [place].  Resolved.  Chest x-ray and possible thoracentesis as above.   Thrombocytosis: Resolved. Hypercalcemia: Resolved. Transaminitis: Resolved. Pain: MS Contin was increased to 30 mg  every 8 hours.  Continue oxycodone as needed.  Patient was also given a referral to radiation oncology. Oophorectomy: Currently scheduled for February 17, 2021.  Patient will not require additional Lupron after her surgery.   Patient expressed understanding and was in agreement with this plan. She also understands that She can call clinic at any time with any questions, concerns, or complaints.     Cancer Staging  Malignant neoplasm of upper-outer quadrant of right breast in female, estrogen receptor positive ([place]) Staging form: Breast, AJCC 8th Edition - Clinical stage from 10/05/2018: Stage IB (cT2, cN0,  cM0, G2, ER+, PR+, HER2-) - Signed by Chauncey Cruel, MD on 04/02/2020 Stage prefix: Initial diagnosis Histologic grading system: 3 grade system - Pathologic stage from 08/09/2020: No Stage Recommended (ypT2, pN1a, pM1, G2, ER+, PR+, HER2-) - Signed by Lloyd Huger, MD on 08/09/2020 Stage prefix: Post-therapy Multigene prognostic tests performed: MammaPrint Histologic grading system: 3 grade system   Lloyd Huger, MD   01/31/2021 9:56 AM

## 2021-01-24 ENCOUNTER — Other Ambulatory Visit: Payer: Self-pay | Admitting: Oncology

## 2021-01-27 ENCOUNTER — Ambulatory Visit
Admission: RE | Admit: 2021-01-27 | Discharge: 2021-01-27 | Disposition: A | Discharge status: Home / Self Care | Payer: BC Managed Care – PPO | Source: Ambulatory Visit | Attending: Oncology | Admitting: Oncology

## 2021-01-27 ENCOUNTER — Other Ambulatory Visit: Payer: Self-pay

## 2021-01-27 DIAGNOSIS — C50411 Malignant neoplasm of upper-outer quadrant of right female breast: Secondary | ICD-10-CM | POA: Diagnosis present

## 2021-01-27 DIAGNOSIS — Z170 Estrogen receptor positive status [ER+]: Secondary | ICD-10-CM | POA: Diagnosis present

## 2021-01-27 MED ORDER — IOHEXOL 300 MG/ML  SOLN
80.0000 mL | Freq: Once | INTRAMUSCULAR | Status: AC | PRN
  Administered 2021-01-27: 80 mL via INTRAVENOUS

## 2021-01-28 ENCOUNTER — Other Ambulatory Visit: Payer: Self-pay | Admitting: Pharmacist

## 2021-01-28 ENCOUNTER — Other Ambulatory Visit: Payer: Self-pay | Admitting: Oncology

## 2021-01-28 ENCOUNTER — Encounter: Payer: Self-pay | Admitting: Oncology

## 2021-01-28 ENCOUNTER — Telehealth: Payer: Self-pay | Admitting: Pharmacist

## 2021-01-28 ENCOUNTER — Other Ambulatory Visit (HOSPITAL_COMMUNITY): Payer: Self-pay

## 2021-01-28 DIAGNOSIS — C50411 Malignant neoplasm of upper-outer quadrant of right female breast: Secondary | ICD-10-CM

## 2021-01-28 MED ORDER — PALBOCICLIB 125 MG PO TABS
125.0000 mg | ORAL_TABLET | Freq: Every day | ORAL | 2 refills | Status: DC
  Filled 2021-01-28: qty 21, 28d supply, fill #0
  Filled 2021-01-28: qty 21, 21d supply, fill #0

## 2021-01-28 MED ORDER — PALBOCICLIB 125 MG PO TABS: 125.0000 mg | ORAL_TABLET | Freq: Every day | ORAL | 2 refills | Status: DC

## 2021-01-28 NOTE — Telephone Encounter (Signed)
Received notification from [place] that Ms. Kaylee Romero had new insurance, needed a PA, and now had to use [facility] to fill her Ibrance.   Ibrance Rx redirected to [facility] and PA pending approval.

## 2021-01-28 NOTE — Telephone Encounter (Signed)
Oral Chemotherapy Pharmacist Encounter  Successfully signed the patient up for a copay card for Ibrance.  ID: [id number] BIN: [id number] Group: [id number] Expiration: 01/04/2022  Billing information will be faxed to [place]. I will place a copy of the card to be scanned into patient's chart.

## 2021-01-28 NOTE — Telephone Encounter (Signed)
Oral Oncology Patient Advocate Encounter  Prior Authorization for Leslee Home has been approved.    PA# [id number] Effective dates: 01/28/21 through 01/28/22  Patient must use [facility] pharmacy.  Oral Oncology Clinic will continue to follow.   [place] [facility] [place] Phone [phone] Fax [phone] 01/28/2021 3:30 PM

## 2021-01-28 NOTE — Telephone Encounter (Signed)
Oral Oncology Pharmacist Encounter   Received notification from [place] that prior authorization for Kaylee Romero is required.   PA submitted on CMM Key [id number]  Status is pending   Oral Oncology Clinic will continue to follow.   Darl Pikes, PharmD, BCPS, [facility] Hematology/Oncology Clinical Pharmacist [facility] [phone]  01/28/2021 11:02 AM

## 2021-01-29 ENCOUNTER — Telehealth: Payer: Self-pay

## 2021-01-29 NOTE — Telephone Encounter (Signed)
Patient called and left me a voicemail requesting to cancel her procedure and appointment with Dr. Vicente Males. I then called the endoscopy unit and spoke to [facility] and told her patient's request.

## 2021-01-30 ENCOUNTER — Other Ambulatory Visit: Payer: BC Managed Care – PPO

## 2021-01-30 ENCOUNTER — Inpatient Hospital Stay: Payer: BC Managed Care – PPO

## 2021-01-30 ENCOUNTER — Other Ambulatory Visit: Payer: Self-pay

## 2021-01-30 ENCOUNTER — Inpatient Hospital Stay: Payer: BC Managed Care – PPO | Attending: Oncology

## 2021-01-30 ENCOUNTER — Inpatient Hospital Stay (HOSPITAL_BASED_OUTPATIENT_CLINIC_OR_DEPARTMENT_OTHER): Payer: BC Managed Care – PPO | Admitting: Oncology

## 2021-01-30 ENCOUNTER — Encounter: Payer: Self-pay | Admitting: Oncology

## 2021-01-30 ENCOUNTER — Inpatient Hospital Stay: Payer: BC Managed Care – PPO | Admitting: Pharmacist

## 2021-01-30 ENCOUNTER — Ambulatory Visit: Payer: BC Managed Care – PPO

## 2021-01-30 ENCOUNTER — Telehealth: Payer: Self-pay | Admitting: Pharmacist

## 2021-01-30 ENCOUNTER — Other Ambulatory Visit (HOSPITAL_COMMUNITY): Payer: Self-pay

## 2021-01-30 VITALS — BP 134/92 | HR 105 | Temp 98.2°F | Resp 16 | Ht 63.0 in | Wt 170.0 lb

## 2021-01-30 DIAGNOSIS — C782 Secondary malignant neoplasm of pleura: Secondary | ICD-10-CM | POA: Diagnosis not present

## 2021-01-30 DIAGNOSIS — C50411 Malignant neoplasm of upper-outer quadrant of right female breast: Secondary | ICD-10-CM | POA: Diagnosis present

## 2021-01-30 DIAGNOSIS — C7951 Secondary malignant neoplasm of bone: Secondary | ICD-10-CM | POA: Insufficient documentation

## 2021-01-30 DIAGNOSIS — Z170 Estrogen receptor positive status [ER+]: Secondary | ICD-10-CM

## 2021-01-30 DIAGNOSIS — C7800 Secondary malignant neoplasm of unspecified lung: Secondary | ICD-10-CM | POA: Diagnosis not present

## 2021-01-30 DIAGNOSIS — Z5111 Encounter for antineoplastic chemotherapy: Secondary | ICD-10-CM | POA: Diagnosis present

## 2021-01-30 LAB — CBC WITH DIFFERENTIAL/PLATELET
Abs Immature Granulocytes: 0.01 10*3/uL (ref 0.00–0.07)
Basophils Absolute: 0.1 10*3/uL (ref 0.0–0.1)
Basophils Relative: 1 %
Eosinophils Absolute: 0.1 10*3/uL (ref 0.0–0.5)
Eosinophils Relative: 2 %
HCT: 37.4 % (ref 36.0–46.0)
Hemoglobin: 13.1 g/dL (ref 12.0–15.0)
Immature Granulocytes: 0 %
Lymphocytes Relative: 29 %
Lymphs Abs: 1.1 10*3/uL (ref 0.7–4.0)
MCH: 35 pg — ABNORMAL HIGH (ref 26.0–34.0)
MCHC: 35 g/dL (ref 30.0–36.0)
MCV: 100 fL (ref 80.0–100.0)
Monocytes Absolute: 0.5 10*3/uL (ref 0.1–1.0)
Monocytes Relative: 13 %
Neutro Abs: 2.1 10*3/uL (ref 1.7–7.7)
Neutrophils Relative %: 55 %
Platelets: 257 10*3/uL (ref 150–400)
RBC: 3.74 MIL/uL — ABNORMAL LOW (ref 3.87–5.11)
RDW: 12.3 % (ref 11.5–15.5)
WBC: 3.9 10*3/uL — ABNORMAL LOW (ref 4.0–10.5)
nRBC: 0 % (ref 0.0–0.2)

## 2021-01-30 LAB — COMPREHENSIVE METABOLIC PANEL
ALT: 28 U/L (ref 0–44)
AST: 31 U/L (ref 15–41)
Albumin: 4.2 g/dL (ref 3.5–5.0)
Alkaline Phosphatase: 68 U/L (ref 38–126)
Anion gap: 11 (ref 5–15)
BUN: 15 mg/dL (ref 6–20)
CO2: 29 mmol/L (ref 22–32)
Calcium: 9.5 mg/dL (ref 8.9–10.3)
Chloride: 97 mmol/L — ABNORMAL LOW (ref 98–111)
Creatinine, Ser: 0.55 mg/dL (ref 0.44–1.00)
GFR, Estimated: >60 mL/min (ref >60)
Glucose, Bld: 100 mg/dL — ABNORMAL HIGH (ref 70–99)
Potassium: 4.4 mmol/L (ref 3.5–5.1)
Sodium: 137 mmol/L (ref 135–145)
Total Bilirubin: 0.3 mg/dL (ref 0.3–1.2)
Total Protein: 7.9 g/dL (ref 6.5–8.1)

## 2021-01-30 MED ORDER — LEUPROLIDE ACETATE 3.75 MG IM KIT
3.7500 mg | PACK | Freq: Once | INTRAMUSCULAR | Status: AC
  Administered 2021-01-30: 3.75 mg via INTRAMUSCULAR
  Filled 2021-01-30: qty 3.75

## 2021-01-30 MED ORDER — MORPHINE SULFATE ER 15 MG PO TBCR: 30.0000 mg | EXTENDED_RELEASE_TABLET | Freq: Three times a day (TID) | ORAL | 0 refills | Status: DC

## 2021-01-30 MED ORDER — ONDANSETRON HCL 8 MG PO TABS: 8.0000 mg | ORAL_TABLET | Freq: Three times a day (TID) | ORAL | 1 refills | Status: AC | PRN

## 2021-01-30 MED ORDER — DENOSUMAB 120 MG/1.7ML ~~LOC~~ SOLN
120.0000 mg | Freq: Once | SUBCUTANEOUS | Status: AC
  Administered 2021-01-30: 120 mg via SUBCUTANEOUS
  Filled 2021-01-30: qty 1.7

## 2021-01-30 MED ORDER — CAPECITABINE 500 MG PO TABS: 2000.0000 mg | ORAL_TABLET | Freq: Two times a day (BID) | ORAL | 1 refills | Status: DC

## 2021-01-30 NOTE — Progress Notes (Signed)
Not discussed

## 2021-01-30 NOTE — Progress Notes (Signed)
[place]  Telephone:([phone] [phone]  Patient Care Team: Dion Body, MD as PCP - General (Family Medicine) Gery Pray, MD as Consulting Physician (Radiation Oncology) Jovita Kussmaul, MD as Consulting Physician (General Surgery) Dillingham, Loel Lofty, DO as Attending Physician (Plastic Surgery) Lloyd Huger, MD as Consulting Physician (Oncology)   Name of the patient: Kaylee Romero  [id number]  Jun 03, 1980   Date of visit: 01/30/21  HPI: Patient is a 41 y.o. female with progressive metastatic breast cancer, ER/PR positive, HER2 negative. CT on 01/27/21 shoed progressive disease. Plan to discontinue palbociclib and switch patient to treatment with capecitabine.  Reason for Consult: Capecitabine oral chemotherapy education.   PAST MEDICAL HISTORY: Past Medical History:  Diagnosis Date   Anxiety    Family history of breast cancer    HSV (herpes simplex virus) anogenital infection    positive titer only    HEMATOLOGY/ONCOLOGY HISTORY:  Oncology History  Malignant neoplasm of upper-outer quadrant of right breast in female, estrogen receptor positive ([place])  09/29/2018 Initial Diagnosis   Malignant neoplasm of upper-outer quadrant of right breast in female, estrogen receptor positive ([place])   09/2018 -  Anti-estrogen oral therapy   Tamoxifen; held from 12/05/2018-09/06/2019 for surgery   10/05/2018 Cancer Staging   Staging form: Breast, AJCC 8th Edition - Clinical stage from 10/05/2018: Stage IB (cT2, cN0, cM0, G2, ER+, PR+, HER2-) - Signed by Chauncey Cruel, MD on 04/02/2020 Stage prefix: Initial diagnosis Histologic grading system: 3 grade system    10/13/2018 Genetic Testing   Negative genetic testing. No pathogenic variants identified on the [facility] Breast Cancer STAT Panel + Common Hereditary Cancers Panel. The Common Hereditary Cancers Panel offered by [facility] includes sequencing and/or deletion  duplication testing of the following 47 genes: APC, ATM, AXIN2, BARD1, BMPR1A, BRCA1, BRCA2, BRIP1, CDH1, CDKN2A (p14ARF), CDKN2A (p16INK4a), CKD4, CHEK2, CTNNA1, DICER1, EPCAM (Deletion/duplication testing only), GREM1 (promoter region deletion/duplication testing only), KIT, MEN1, MLH1, MSH2, MSH3, MSH6, MUTYH, NBN, NF1, NHTL1, PALB2, PDGFRA, PMS2, POLD1, POLE, PTEN, RAD50, RAD51C, RAD51D, SDHB, SDHC, SDHD, SMAD4, SMARCA4. STK11, TP53, TSC1, TSC2, and VHL.  The following genes were evaluated for sequence changes only: SDHA and HOXB13 c.251G>A variant only. The report date is 10/13/2018.    11/21/2018 Surgery   Right lumpectomy [name]) [phone]): IDC, grade 2, 2.6 cm, with DCIS. Negative margins. 1/4 lymph nodes positive for macrometastasis. ER/PR positive, HER-2 negative.   12/26/2018 - 06/01/2019 Chemotherapy   dexamethasone (DECADRON) 4 MG tablet, 1 of 1 cycle, Start date: 12/05/2018, End date: 03/02/2019  DOXOrubicin (ADRIAMYCIN) chemo injection 114 mg, 60 mg/m2 = 114 mg, Intravenous,  Once, 4 of 4 cycles. Administration: 114 mg (01/05/2019), 114 mg (01/19/2019), 114 mg (02/02/2019), 114 mg (02/15/2019)  palonosetron (ALOXI) injection 0.25 mg, 0.25 mg, Intravenous,  Once, 1 of 1 cycle. Administration: 0.25 mg (01/05/2019)  pegfilgrastim (NEULASTA ONPRO KIT) injection 6 mg, 6 mg, Subcutaneous, Once, 2 of 2 cycles  pegfilgrastim-jmdb (FULPHILA) injection 6 mg, 6 mg, Subcutaneous,  Once, 4 of 4 cycles. Administration: 6 mg (01/07/2019), 6 mg (01/21/2019), 6 mg (02/06/2019), 6 mg (02/17/2019)  cyclophosphamide (CYTOXAN) 1,140 mg in sodium chloride 0.9 % 250 mL chemo infusion, 600 mg/m2 = 1,140 mg, Intravenous,  Once, 4 of 4 cycles. Administration: 1,140 mg (01/05/2019), 1,140 mg (01/19/2019), 1,140 mg (02/02/2019), 1,140 mg (02/15/2019).  PACLitaxel (TAXOL) 150 mg in sodium chloride 0.9 % 250 mL chemo infusion (</= $RemoveBefor'80mg'cHWFuOkwdiTQ$ /m2), 80 mg/m2 = 150 mg, Intravenous,  Once, 12 of 12  cycles. Administration: 150 mg  (03/02/2019), 150 mg (03/09/2019), 150 mg (03/23/2019), 150 mg (03/30/2019), 150 mg (04/06/2019), 150 mg (04/20/2019), 150 mg (04/27/2019), 150 mg (05/03/2019), 150 mg (05/11/2019), 150 mg (05/18/2019), 150 mg (05/25/2019), 150 mg (06/01/2019)  fosaprepitant (EMEND) 150 mg  dexamethasone (DECADRON) 12 mg in sodium chloride 0.9 % 145 mL IVPB, , Intravenous,  Once, 4 of 4 cycles. Administration:  (01/05/2019),  (01/19/2019),  (02/02/2019),  (02/15/2019).   06/21/2019 - 08/11/2019 Radiation Therapy   The patient initially received a dose of 50.4 Gy in 28 fractions to the breast using whole-breast tangent fields. This was delivered using a 3-D conformal technique. The pt received a boost delivering an additional 16 Gy in 8 fractions using a electron boost with 14meV electrons. The total dose was 66.4 Gy.    11/12/2019 Cancer Staging   Staging form: Breast, AJCC 8th Edition - Pathologic: Stage IB (pT2, pN1a, cM0, G2, ER+, PR+, HER2-)     Oncotype testing   MammaPrint high risk     ALLERGIES:  is allergic to betadine [povidone-iodine] and povidone iodine.  MEDICATIONS:  Current Outpatient Medications  Medication Sig Dispense Refill   acetaminophen (TYLENOL) 325 MG tablet Take 2 tablets (650 mg total) by mouth every 6 (six) hours as needed for mild pain (or Fever >/= 101).     albuterol (VENTOLIN HFA) 108 (90 Base) MCG/ACT inhaler INHALE 2 PUFFS INTO THE LUNGS EVERY 6 HOURS AS NEEDED FOR WHEEZING OR SHORTNESS OF BREATH 6.7 g 2   ALPRAZolam (XANAX) 0.25 MG tablet Take 1 tablet (0.25 mg total) by mouth 2 (two) times daily as needed for anxiety. 60 tablet 0   amphetamine-dextroamphetamine (ADDERALL XR) 30 MG 24 hr capsule Take 30 mg by mouth daily.     amphetamine-dextroamphetamine (ADDERALL) 10 MG tablet Take 10 mg by mouth daily in the afternoon. (Patient not taking: Reported on 01/30/2021)     Ascorbic Acid (VITAMIN C) 500 MG CAPS Take 500 mg by mouth daily.     chlorpheniramine-HYDROcodone (TUSSIONEX) 10-8 MG/5ML  SUER Take 5 mLs by mouth every 12 (twelve) hours as needed for cough. 140 mL 0   guanFACINE (INTUNIV) 2 MG TB24 ER tablet Take 2 mg by mouth daily.     ibuprofen (ADVIL) 200 MG tablet Take 400 mg by mouth every 6 (six) hours as needed for mild pain.     letrozole (FEMARA) 2.5 MG tablet Take 1 tablet (2.5 mg total) by mouth daily. 90 tablet 3   morphine (MS CONTIN) 15 MG 12 hr tablet Take 2 tablets (30 mg total) by mouth every 8 (eight) hours. 90 tablet 0   Multiple Vitamin (MULTIVITAMIN WITH MINERALS) TABS tablet Take 1 tablet by mouth daily.     oxyCODONE (OXY IR/ROXICODONE) 5 MG immediate release tablet Take 1 tablet (5 mg total) by mouth every 6 (six) hours as needed for severe pain. 90 tablet 0   palbociclib (IBRANCE) 125 MG tablet Take 1 tablet (125 mg total) by mouth daily. Take for 21 days on, 7 days off, repeat every 28 days. 21 tablet 2   polyethylene glycol (MIRALAX / GLYCOLAX) 17 g packet Take 17 g by mouth daily. 14 each 0   senna-docusate (SENOKOT-S) 8.6-50 MG tablet      Tetrahydrozoline HCl (VISINE OP) Place 1 drop into both eyes daily as needed (redness).     venlafaxine XR (EFFEXOR-XR) 150 MG 24 hr capsule TAKE 1 CAPSULE(150 MG) BY MOUTH DAILY WITH BREAKFAST 30 capsule 1   vitamin  B-12 (CYANOCOBALAMIN) 1000 MCG tablet Take 1,000 mcg by mouth daily.     No current facility-administered medications for this visit.    VITAL SIGNS: There were no vitals taken for this visit. There were no vitals filed for this visit.  Estimated body mass index is 30.11 kg/m as calculated from the following:   Height as of an earlier encounter on 01/30/21: $RemoveBef'5\' 3"'GmsnFrtSbZ$  (1.6 m).   Weight as of an earlier encounter on 01/30/21: 77.1 kg (170 lb).  LABS: CBC:    Component Value Date/Time   WBC 3.9 (L) 01/30/2021 0959   HGB 13.1 01/30/2021 0959   HGB 13.1 06/01/2019 1340   HCT 37.4 01/30/2021 0959   PLT 257 01/30/2021 0959   PLT 322 06/01/2019 1340   MCV 100.0 01/30/2021 0959   NEUTROABS 2.1  01/30/2021 0959   LYMPHSABS 1.1 01/30/2021 0959   MONOABS 0.5 01/30/2021 0959   EOSABS 0.1 01/30/2021 0959   BASOSABS 0.1 01/30/2021 0959   Comprehensive Metabolic Panel:    Component Value Date/Time   NA 137 01/30/2021 0959   K 4.4 01/30/2021 0959   CL 97 (L) 01/30/2021 0959   CO2 29 01/30/2021 0959   BUN 15 01/30/2021 0959   CREATININE 0.55 01/30/2021 0959   CREATININE 0.59 06/01/2019 1340   GLUCOSE 100 (H) 01/30/2021 0959   CALCIUM 9.5 01/30/2021 0959   AST 31 01/30/2021 0959   AST 21 06/01/2019 1340   ALT 28 01/30/2021 0959   ALT 26 06/01/2019 1340   ALKPHOS 68 01/30/2021 0959   BILITOT 0.3 01/30/2021 0959   BILITOT 0.5 06/01/2019 1340   PROT 7.9 01/30/2021 0959   ALBUMIN 4.2 01/30/2021 0959     Present during today's visit: patient and her brother  Start plan: Stop palbociclib and switch therapy to capecitabine   CMP from 01/30/21 assessed, no relevant lab abnormalities. Prescription dose and frequency assessed.   Patient agreed to treatment on 01/30/21 per MD documentation.  Patient Education I spoke with patient for overview of new oral chemotherapy medication: capecitabine   Administration: Counseled patient on administration, dosing, side effects, monitoring, drug-food interactions, safe handling, storage, and disposal. Patient will take 4 tablets (2,000 mg total) by mouth 2 (two) times daily after a meal. Take for 14 days, then hold for 7 days. Repeat every 21 days.  Side Effects: Side effects include but not limited to: diarrhea, hand-foot syndrome, mouth sores, edema, decreased wbc, fatigue, N/V Diarrhea: Patient currently struggles with constipation d/t her use of pian medication. She knows if she begins to have diarrhea, she should stop her medications for constipation and begin using loperamide. She should call the office if she has 4 or more loose stools per day Hand-foot syndrome: Provided patient with sample bottle of [place] Smooth Extra Care 20 Mouth  sores: Instructed patient to call for Magic Mouthwash if needed Nausea: prescription sent in for ondansetron prn    Drug-drug Interactions (DDI): No DDIs with capecitabine  Adherence: After discussion with patient no patient barriers to medication adherence identified. Patient was provided with AM/PM pill trays. Reviewed with patient importance of keeping a medication schedule and plan for any missed doses.  Mrs. Ramsaran voiced understanding and appreciation. All questions answered. Medication handout provided.  Provided patient with [facility] phone number. Patient knows to call the office with questions or concerns. Oral [facility] will continue to follow.  Patient expressed understanding and was in agreement with this plan. She also understands that She can  call clinic at any time with any questions, concerns, or complaints.   Medication Access Issues: PA for capecitabine approved and Rx sent to [facility] (per insurance requirement)  Follow-up plan: Pending start date  Thank you for allowing me to participate in the care of this patient.   Time Total: 30 mins  Visit consisted of counseling and education on dealing with issues of symptom management in the setting of serious and potentially life-threatening illness.Greater than 50%  of this time was spent counseling and coordinating care related to the above assessment and plan.  Signed by: Darl Pikes, PharmD, BCPS, Salley Slaughter, CPP Hematology/Oncology Clinical Pharmacist Practitioner [place]/[facility]/AP [facility] [phone]  01/30/2021 1:38 PM

## 2021-01-30 NOTE — Telephone Encounter (Signed)
Oral Oncology Pharmacist Encounter   Received notification from [place] that prior authorization for Capecitabine is required.   PA submitted on CMM Key [id number]  Status is pending   Oral Oncology Clinic will continue to follow.   Darl Pikes, PharmD, BCPS, [facility] Hematology/Oncology Clinical Pharmacist [facility] [phone]  01/30/2021 11:21 AM

## 2021-01-31 ENCOUNTER — Encounter: Payer: Self-pay | Admitting: Oncology

## 2021-01-31 ENCOUNTER — Other Ambulatory Visit: Payer: Self-pay | Admitting: Emergency Medicine

## 2021-01-31 DIAGNOSIS — Z170 Estrogen receptor positive status [ER+]: Secondary | ICD-10-CM

## 2021-01-31 DIAGNOSIS — C50411 Malignant neoplasm of upper-outer quadrant of right female breast: Secondary | ICD-10-CM

## 2021-01-31 LAB — CANCER ANTIGEN 27.29: CA 27.29: 153.2 U/mL — ABNORMAL HIGH (ref 0.0–38.6)

## 2021-02-01 ENCOUNTER — Encounter: Payer: Self-pay | Admitting: Pharmacist

## 2021-02-03 ENCOUNTER — Encounter: Payer: Self-pay | Admitting: Emergency Medicine

## 2021-02-03 ENCOUNTER — Encounter: Payer: Self-pay | Admitting: Oncology

## 2021-02-03 NOTE — Telephone Encounter (Signed)
Oral Oncology Patient Advocate Encounter  Prior Authorization for Capecitabine has been approved.    PA# [id number] Effective dates: 01/30/21 through 01/30/22  Patient must use [facility] pharmacy.   Oral Oncology Clinic will continue to follow.   [place] [facility] [place] Phone [phone] Fax [phone] 02/03/2021 9:41 AM

## 2021-02-04 ENCOUNTER — Encounter: Payer: Self-pay | Admitting: Oncology

## 2021-02-04 ENCOUNTER — Encounter: Admission: RE | Payer: Self-pay | Source: Home / Self Care

## 2021-02-04 ENCOUNTER — Ambulatory Visit
Admission: RE | Admit: 2021-02-04 | Payer: BC Managed Care – PPO | Source: Home / Self Care | Admitting: Gastroenterology

## 2021-02-04 SURGERY — COLONOSCOPY WITH PROPOFOL
Anesthesia: General

## 2021-02-05 ENCOUNTER — Ambulatory Visit
Admission: RE | Admit: 2021-02-05 | Discharge: 2021-02-05 | Disposition: A | Discharge status: Home / Self Care | Payer: BC Managed Care – PPO | Source: Ambulatory Visit | Attending: Radiation Oncology | Admitting: Radiation Oncology

## 2021-02-05 ENCOUNTER — Other Ambulatory Visit: Payer: Self-pay | Admitting: Oncology

## 2021-02-05 ENCOUNTER — Other Ambulatory Visit: Payer: Self-pay

## 2021-02-05 VITALS — BP 118/77 | HR 112 | Ht 63.0 in | Wt 168.0 lb

## 2021-02-05 DIAGNOSIS — Z170 Estrogen receptor positive status [ER+]: Secondary | ICD-10-CM | POA: Insufficient documentation

## 2021-02-05 DIAGNOSIS — C7951 Secondary malignant neoplasm of bone: Secondary | ICD-10-CM | POA: Insufficient documentation

## 2021-02-05 DIAGNOSIS — M549 Dorsalgia, unspecified: Secondary | ICD-10-CM | POA: Insufficient documentation

## 2021-02-05 DIAGNOSIS — R0781 Pleurodynia: Secondary | ICD-10-CM | POA: Insufficient documentation

## 2021-02-05 DIAGNOSIS — C50411 Malignant neoplasm of upper-outer quadrant of right female breast: Secondary | ICD-10-CM | POA: Diagnosis present

## 2021-02-05 DIAGNOSIS — C7989 Secondary malignant neoplasm of other specified sites: Secondary | ICD-10-CM

## 2021-02-05 DIAGNOSIS — C787 Secondary malignant neoplasm of liver and intrahepatic bile duct: Secondary | ICD-10-CM | POA: Diagnosis not present

## 2021-02-05 NOTE — Progress Notes (Signed)
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 female presents to the clinic today for palliative radiation therapy to her thoracic spine and patient with known stage IV invasive mammary carcinoma.  REFERRING PROVIDER: Dion Body, MD  HPI: Patient is a 41 year old female well-known to our department having previously treated for stage IIb (T2 N1 aM0) invasive mammary carcinoma completing whole breast radiation to her right breast..  She went on to develop metastatic disease with involvement of her lung as well as spine with significant back pain and left peripheral rib pain.  She has been treated with oral Xeloda recently and could not tolerate Zometa.  She is been put on Xgeva.  She continues to have significant back pain is currently on narcotic analgesics.  Recent CT scan shows worsening disease in chest with increasing pleural thickening and left hemidiaphragm nodularity both above and below the diaphragm.  She also has had hepatic metastasis.  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  She also has destructive changes and pathologic fracture of left sided ribs and increasing size of a lytic focus in T10.  I have been asked to evaluate her for possible palliative radiation therapy to her spine.  She continues to ambulate well overall is in general good general condition.  Still complains of left peripheral rib pain.  COMPLICATIONS OF TREATMENT: none  FOLLOW UP COMPLIANCE: keeps appointments   PHYSICAL EXAM:  BP 118/77    Pulse (!) 112    Ht 5\' 3"  (1.6 m)    Wt 168 lb (76.2 kg)    BMI 29.76 kg/m  Well-developed well-nourished patient in NAD. HEENT reveals PERLA, EOMI, discs not visualized.  Oral cavity is clear. No oral mucosal lesions are identified. Neck is clear without evidence of cervical or supraclavicular adenopathy. Lungs are clear to A&P. Cardiac  examination is essentially unremarkable with regular rate and rhythm without murmur rub or thrill. Abdomen is benign with no organomegaly or masses noted. Motor sensory and DTR levels are equal and symmetric in the upper and lower extremities. Cranial nerves II through XII are grossly intact. Proprioception is intact. No peripheral adenopathy or edema is identified. No motor or sensory levels are noted. Crude visual fields are within normal range.  RADIOLOGY RESULTS: CT scans MRI scans reviewed compatible with above-stated findings  PLAN: At this time elect to go with palliative radiation therapy to her thoracic spine.  Plan on delivering [place] in 10 fractions.  Risks and benefits of treatment including skin reaction fatigue possible diarrhea all were discussed in detail with the patient.  Should she have persistent pain in her left ribs would also offer palliative radiation therapy and a more hypofractionated course of treatment 20 Gray in 5 fractions although I would like to see if her spinal radiation does anything to decrease her overall pain since this may be referred from her spinal metastasis.  Patient comprehends my recommendations well.  I personally set up and ordered CT simulation.  I would like to take this opportunity to thank you for allowing me to participate in the care of your patient.Noreene Filbert, MD

## 2021-02-06 ENCOUNTER — Encounter: Payer: Self-pay | Admitting: Oncology

## 2021-02-06 ENCOUNTER — Other Ambulatory Visit: Payer: Self-pay | Admitting: Emergency Medicine

## 2021-02-06 ENCOUNTER — Ambulatory Visit
Admission: RE | Admit: 2021-02-06 | Discharge: 2021-02-06 | Disposition: A | Discharge status: Home / Self Care | Payer: BC Managed Care – PPO | Source: Ambulatory Visit | Attending: Radiation Oncology | Admitting: Radiation Oncology

## 2021-02-06 DIAGNOSIS — C50411 Malignant neoplasm of upper-outer quadrant of right female breast: Secondary | ICD-10-CM | POA: Insufficient documentation

## 2021-02-06 DIAGNOSIS — C7951 Secondary malignant neoplasm of bone: Secondary | ICD-10-CM | POA: Insufficient documentation

## 2021-02-06 DIAGNOSIS — Z510 Encounter for antineoplastic radiation therapy: Secondary | ICD-10-CM | POA: Insufficient documentation

## 2021-02-06 DIAGNOSIS — C50911 Malignant neoplasm of unspecified site of right female breast: Secondary | ICD-10-CM

## 2021-02-06 MED ORDER — OXYCODONE HCL 5 MG PO TABS: 5.0000 mg | ORAL_TABLET | Freq: Four times a day (QID) | ORAL | 0 refills | Status: DC | PRN

## 2021-02-06 NOTE — Progress Notes (Signed)
Radiation Oncology Follow up Note  Name: Kaylee Romero   Date:   02/05/2021 MRN:  [id number] DOB: [birth year]    This is an addendum to my reconsultation [name].  Since patient has only 1 site of metastatic disease in her spine and some lateral rib involvement I have discussed with my colleagues and feel SBRT 30 Gray in 5 fractions to her thoracic spine lesion would be the best treatment with minimal side effects and increased chance of local control in the side of her thoracic spine and patient is otherwise in excellent overall general condition.  I have altered my treatment plan for SBRT to her thoracic spine 30 Gray in 10 fractions.  I would like to take this opportunity to thank you for allowing me to participate in the care of your patient.Marland Kitchen

## 2021-02-06 NOTE — Addendum Note (Signed)
Encounter addended by: Noreene Filbert, MD on: 02/06/2021 3:40 PM  Actions taken: Clinical Note Signed

## 2021-02-11 DIAGNOSIS — Z510 Encounter for antineoplastic radiation therapy: Secondary | ICD-10-CM | POA: Diagnosis not present

## 2021-02-12 ENCOUNTER — Ambulatory Visit: Payer: BC Managed Care – PPO

## 2021-02-12 ENCOUNTER — Other Ambulatory Visit: Payer: Self-pay

## 2021-02-12 ENCOUNTER — Encounter (HOSPITAL_BASED_OUTPATIENT_CLINIC_OR_DEPARTMENT_OTHER): Payer: Self-pay | Admitting: Obstetrics & Gynecology

## 2021-02-12 NOTE — Progress Notes (Addendum)
ADDENDUM:  chart reviewed w/ anesthesia, Dr Gifford Shave MDA,  stated ok to proceed.   Spoke w/ via phone for pre-op interview---pt Lab needs dos----  urine preg//  pt is getting lab work done 02-13-2021 CBC, CMP, T&S             Lab results------current ekg in epic/ chart COVID test -----patient states asymptomatic no test needed Arrive at ------- 0530 on 02-17-2021 NPO after MN NO Solid Food.  Clear liquids from MN until--- 0430 Med rec completed Medications to take morning of surgery ----- ms contin Diabetic medication ----- n/a Patient instructed no nail polish to be worn day of surgery Patient instructed to bring photo id and insurance card day of surgery Patient aware to have Driver (ride ) / caregiver  for 24 hours after surgery --husband, cory Patient Special Instructions ----- asked to bring rescue inhaler dos Pre-Op special Istructions ----- n/a Patient verbalized understanding of instructions that were given at this phone interview. Patient denies shortness of breath, chest pain, fever, cough at this phone interview.    Anesthesia Review: hx right breast cancer 09/ 2020 s/p lumpectomy node dissection and completed chemoradiation 08/ 2021;;  recurrent breast cancer 07/ 2022 to lung pleura s/p vats with pleurx insertion 07/ 2022, pt stated pleurx d/c'd 08/ 2022 and no thoracentesis since 07/ 2022. Pt denies cough, occ sob with stairs,  uses rescue inhaler twice daily and chronic nausea due to chemo medication. Pt stated is scheduled to have SBRT to thoracic spine on 02-14-2021 before surgery and 02-19-2021 after surgery.  PCP:  Dr Raliegh Ip. Linthavoung (lov 11-11-2020 epic) Oncologist:  Dr Grayland Ormond (lov 01-30-2021 epic) Pulmologist:  Dr Lanney Gins [facility] 07-24-2020) Chest x-ray : CT 01-27-2021 epic EKG : 08-05-20223 epic Echo : 10-27-2018 Stress test: no Activity level:  denies sob w/ regular activity but occ sob with stairs

## 2021-02-13 ENCOUNTER — Encounter (HOSPITAL_COMMUNITY)
Admission: RE | Admit: 2021-02-13 | Discharge: 2021-02-13 | Disposition: A | Discharge status: Home / Self Care | Payer: BC Managed Care – PPO | Source: Ambulatory Visit | Attending: Obstetrics & Gynecology | Admitting: Obstetrics & Gynecology

## 2021-02-13 ENCOUNTER — Ambulatory Visit: Payer: BC Managed Care – PPO | Admitting: Gastroenterology

## 2021-02-13 DIAGNOSIS — Z853 Personal history of malignant neoplasm of breast: Secondary | ICD-10-CM | POA: Diagnosis not present

## 2021-02-13 DIAGNOSIS — Z01812 Encounter for preprocedural laboratory examination: Secondary | ICD-10-CM | POA: Insufficient documentation

## 2021-02-13 DIAGNOSIS — Z510 Encounter for antineoplastic radiation therapy: Secondary | ICD-10-CM | POA: Diagnosis not present

## 2021-02-13 LAB — COMPREHENSIVE METABOLIC PANEL
ALT: 30 U/L (ref 0–44)
AST: 35 U/L (ref 15–41)
Albumin: 3.9 g/dL (ref 3.5–5.0)
Alkaline Phosphatase: 66 U/L (ref 38–126)
Anion gap: 7 (ref 5–15)
BUN: 15 mg/dL (ref 6–20)
CO2: 30 mmol/L (ref 22–32)
Calcium: 9.4 mg/dL (ref 8.9–10.3)
Chloride: 97 mmol/L — ABNORMAL LOW (ref 98–111)
Creatinine, Ser: 0.63 mg/dL (ref 0.44–1.00)
GFR, Estimated: >60 mL/min (ref >60)
Glucose, Bld: 103 mg/dL — ABNORMAL HIGH (ref 70–99)
Potassium: 4.4 mmol/L (ref 3.5–5.1)
Sodium: 134 mmol/L — ABNORMAL LOW (ref 135–145)
Total Bilirubin: 0.2 mg/dL — ABNORMAL LOW (ref 0.3–1.2)
Total Protein: 7.5 g/dL (ref 6.5–8.1)

## 2021-02-13 LAB — CBC
HCT: 37.8 % (ref 36.0–46.0)
Hemoglobin: 12.9 g/dL (ref 12.0–15.0)
MCH: 33.6 pg (ref 26.0–34.0)
MCHC: 34.1 g/dL (ref 30.0–36.0)
MCV: 98.4 fL (ref 80.0–100.0)
Platelets: 401 10*3/uL — ABNORMAL HIGH (ref 150–400)
RBC: 3.84 MIL/uL — ABNORMAL LOW (ref 3.87–5.11)
RDW: 11.7 % (ref 11.5–15.5)
WBC: 6.8 10*3/uL (ref 4.0–10.5)
nRBC: 0 % (ref 0.0–0.2)

## 2021-02-14 ENCOUNTER — Ambulatory Visit
Admission: RE | Admit: 2021-02-14 | Discharge: 2021-02-14 | Disposition: A | Discharge status: Home / Self Care | Payer: BC Managed Care – PPO | Source: Ambulatory Visit | Attending: Radiation Oncology | Admitting: Radiation Oncology

## 2021-02-14 DIAGNOSIS — Z510 Encounter for antineoplastic radiation therapy: Secondary | ICD-10-CM | POA: Diagnosis not present

## 2021-02-16 NOTE — Anesthesia Preprocedure Evaluation (Addendum)
Anesthesia Evaluation  Patient identified by MRN, date of birth, ID band Patient awake    Reviewed: Allergy & Precautions, NPO status , Patient's Chart, lab work & pertinent test results  Airway Mallampati: II  TM Distance: >3 FB Neck ROM: Full    Dental no notable dental hx.    Pulmonary former smoker,  Lung metastasis   Pulmonary exam normal breath sounds clear to auscultation       Cardiovascular negative cardio ROS Normal cardiovascular exam Rhythm:Regular Rate:Normal     Neuro/Psych PSYCHIATRIC DISORDERS Anxiety negative neurological ROS     GI/Hepatic negative GI ROS, (+)     substance abuse  ,   Endo/Other  negative endocrine ROS  Renal/GU negative Renal ROS     Musculoskeletal  (+) narcotic dependentChronic narcotic use due to bone mets   Abdominal   Peds  Hematology negative hematology ROS (+)   Anesthesia Other Findings ER+/PR+ BREAST CANCER  Reproductive/Obstetrics hcg negative                            Anesthesia Physical Anesthesia Plan  ASA: 3  Anesthesia Plan: General   Post-op Pain Management: Ketamine IV, Toradol IV (intra-op) and Tylenol PO (pre-op)   Induction: Intravenous  PONV Risk Score and Plan: 4 or greater and Ondansetron, Dexamethasone, Midazolam, Scopolamine patch - Pre-op and Treatment may vary due to age or medical condition  Airway Management Planned: Oral ETT  Additional Equipment:   Intra-op Plan:   Post-operative Plan: Extubation in OR  Informed Consent: I have reviewed the patients History and Physical, chart, labs and discussed the procedure including the risks, benefits and alternatives for the proposed anesthesia with the patient or authorized representative who has indicated his/her understanding and acceptance.     Dental advisory given  Plan Discussed with: CRNA and Surgeon  Anesthesia Plan Comments:        Anesthesia  Quick Evaluation

## 2021-02-16 NOTE — H&P (Signed)
Kaylee Romero is an 41 y.o. 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  She had negative genetic testing.  Patient and her oncology care team have decided to proceed with BSO given receptor positivity and patient's desire to discontinue Lupron.  Patient has definitively completed child-bearing.    Pertinent Gynecological History: Menses:  amenorrhea with Mirena and Lupron Bleeding: n/a Contraception: IUD DES exposure: unknown Blood transfusions: none Sexually transmitted diseases: no past history Previous GYN Procedures:  none   Last mammogram: abnormal: CA  Date: 10/18/20 Last pap: normal Date: 11/18/20 OB History: G1, P1   Menstrual History: Menarche age: n/a No LMP recorded. (Menstrual status: Chemotherapy).    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   Malignant neoplasm of upper-outer quadrant of right breast in female, estrogen receptor positive [facility])    oncologist--- dr Grayland Ormond;;  first dx 09/ 2020 s/p right lumpectomy w/ node dissection and completed chemoradiation 08/ 2021;;  recurrent 07/ 2022 to lung pleura,liver, and thoracic spine   SOB (shortness of breath)    02-12-2021  pt denies with regular activity but sob with stairs    Past Surgical History:  Procedure Laterality Date   BREAST LUMPECTOMY WITH AXILLARY LYMPH NODE BIOPSY Right 11/21/2018   Procedure: RIGHT BREAST LUMPECTOMY WITH SENTINEL LYMPH NODE BIOPSY;  Surgeon: Jovita Kussmaul, MD;  Location: [place];  Service: General;  Laterality: Right;   BREAST REDUCTION SURGERY Bilateral 11/21/2018   Procedure:  BILATERAL MAMMARY REDUCTION  (BREAST);  Surgeon: Wallace Going, DO;  Location: [place];  Service: Plastics;  Laterality: Bilateral;   CHEST TUBE INSERTION Left 07/29/2020   Procedure: INSERTION PLEURAL DRAINAGE CATHETER;  Surgeon: Lajuana Matte, MD;  Location: [place];  Service: Thoracic;  Laterality: Left;   IRRIGATION AND DEBRIDEMENT STERNOCLAVICULAR JOINT-STERNUM AND RIBS N/A 08/13/2020   Procedure: IRRIGATION AND DEBRIDEMENT CHEST WALL ABSCESS;  Surgeon: Lajuana Matte, MD;  Location: [place];  Service: Cardiothoracic;  Laterality: N/A;   PLEURAL BIOPSY Left 07/29/2020   Procedure: PLEURAL BIOPSY;  Surgeon: Lajuana Matte, MD;  Location: [place];  Service: Thoracic;  Laterality: Left;   PLEURAL EFFUSION DRAINAGE Left 07/29/2020   Procedure: DRAINAGE OF PLEURAL EFFUSION;  Surgeon: Lajuana Matte, MD;  Location: [place];  Service: Thoracic;  Laterality: Left;   PORT-A-CATH REMOVAL N/A 07/06/2019   Procedure: REMOVAL PORT-A-CATH;  Surgeon: Jovita Kussmaul, MD;  Location: [place];  Service: General;  Laterality: N/A;   PORTACATH PLACEMENT N/A 11/21/2018   Procedure: INSERTION LEFT PORT-A-CATH WITH ULTRASOUND GUIDANCE;  Surgeon: Jovita Kussmaul, MD;  Location: [place];  Service: General;  Laterality: N/A;   TONSILLECTOMY  1987   VIDEO ASSISTED THORACOSCOPY Left 07/29/2020   Procedure: VIDEO ASSISTED THORACOSCOPY;  Surgeon: Lajuana Matte, MD;  Location[facility];  Service: Thoracic;  Laterality: Left;   WISDOM TOOTH EXTRACTION      Family History  Problem Relation Age of Onset   Hypertension Father    Alcohol abuse Paternal Grandfather  Heart disease Paternal Grandfather    Alcohol abuse Maternal Grandfather    Heart disease Maternal Grandmother    Breast cancer Other     Social History:  reports that she quit smoking about 4 years ago. Her smoking use included cigarettes. She started smoking about 19 years ago. She has a 10.00 pack-year smoking  history. She has never used smokeless tobacco. She reports current alcohol use. She reports that she does not use drugs.  Allergies:  Allergies  Allergen Reactions   Betadine [Povidone-Iodine] Rash    No medications prior to admission.    Review of Systems  Height $Remov'5\' 3"'mYFpIv$  (1.6 m), weight 75.8 kg. Physical Exam Constitutional:      Appearance: Normal appearance.  HENT:     Head: Normocephalic and atraumatic.  Pulmonary:     Effort: Pulmonary effort is normal.  Abdominal:     Palpations: Abdomen is soft.  Musculoskeletal:        General: Normal range of motion.     Cervical back: Normal range of motion.  Skin:    General: Skin is warm and dry.  Neurological:     Mental Status: She is alert and oriented to person, place, and time.  Psychiatric:        Mood and Affect: Mood normal.        Behavior: Behavior normal.    No results found for this or any previous visit (from the past 24 hour(s)).  No results found.  Assessment/Plan: 40yo with ER+/PR+ breast cancer desiring BSO  -L/S BSO with IUD removal -Patient is counseled re: risk of bleeding, infection, scarring and damage to surrounding structures.  She is informed of steps of the procedure itself as well as postop expectations.  She is certain she has complete child-bearing.  Patient is informed of surgical menopause and associated symptoms and health impact.  All questions were answered and patient wishes to proceed.  Linda Hedges 02/16/2021, 6:45 AM

## 2021-02-17 ENCOUNTER — Other Ambulatory Visit: Payer: Self-pay

## 2021-02-17 ENCOUNTER — Encounter (HOSPITAL_BASED_OUTPATIENT_CLINIC_OR_DEPARTMENT_OTHER)
Admission: RE | Disposition: A | Discharge status: Home / Self Care | Payer: Self-pay | Source: Home / Self Care | Attending: Obstetrics & Gynecology

## 2021-02-17 ENCOUNTER — Ambulatory Visit (HOSPITAL_BASED_OUTPATIENT_CLINIC_OR_DEPARTMENT_OTHER): Payer: BC Managed Care – PPO | Admitting: Anesthesiology

## 2021-02-17 ENCOUNTER — Encounter (HOSPITAL_BASED_OUTPATIENT_CLINIC_OR_DEPARTMENT_OTHER): Payer: Self-pay | Admitting: Obstetrics & Gynecology

## 2021-02-17 ENCOUNTER — Ambulatory Visit: Payer: BC Managed Care – PPO

## 2021-02-17 ENCOUNTER — Other Ambulatory Visit: Payer: Self-pay | Admitting: Emergency Medicine

## 2021-02-17 ENCOUNTER — Ambulatory Visit (HOSPITAL_BASED_OUTPATIENT_CLINIC_OR_DEPARTMENT_OTHER)
Admission: RE | Admit: 2021-02-17 | Discharge: 2021-02-17 | Disposition: A | Discharge status: Home / Self Care | Payer: BC Managed Care – PPO | Attending: Obstetrics & Gynecology | Admitting: Obstetrics & Gynecology

## 2021-02-17 DIAGNOSIS — Z87891 Personal history of nicotine dependence: Secondary | ICD-10-CM | POA: Insufficient documentation

## 2021-02-17 DIAGNOSIS — Z853 Personal history of malignant neoplasm of breast: Secondary | ICD-10-CM

## 2021-02-17 DIAGNOSIS — C50919 Malignant neoplasm of unspecified site of unspecified female breast: Secondary | ICD-10-CM | POA: Insufficient documentation

## 2021-02-17 DIAGNOSIS — C7960 Secondary malignant neoplasm of unspecified ovary: Secondary | ICD-10-CM | POA: Insufficient documentation

## 2021-02-17 DIAGNOSIS — Z170 Estrogen receptor positive status [ER+]: Secondary | ICD-10-CM

## 2021-02-17 DIAGNOSIS — C7982 Secondary malignant neoplasm of genital organs: Secondary | ICD-10-CM | POA: Diagnosis not present

## 2021-02-17 DIAGNOSIS — Z30432 Encounter for removal of intrauterine contraceptive device: Secondary | ICD-10-CM | POA: Diagnosis not present

## 2021-02-17 DIAGNOSIS — C782 Secondary malignant neoplasm of pleura: Secondary | ICD-10-CM | POA: Insufficient documentation

## 2021-02-17 DIAGNOSIS — Z79891 Long term (current) use of opiate analgesic: Secondary | ICD-10-CM | POA: Diagnosis not present

## 2021-02-17 DIAGNOSIS — C50911 Malignant neoplasm of unspecified site of right female breast: Secondary | ICD-10-CM

## 2021-02-17 HISTORY — DX: Opioid use, unspecified, uncomplicated: F11.90

## 2021-02-17 HISTORY — PX: ABDOMINAL HYSTERECTOMY: SHX81

## 2021-02-17 HISTORY — DX: Personal history of other complications of pregnancy, childbirth and the puerperium: Z87.59

## 2021-02-17 HISTORY — DX: Estrogen receptor positive status (ER+): Z17.0

## 2021-02-17 HISTORY — PX: LAPAROSCOPIC SALPINGO OOPHERECTOMY: SHX5927

## 2021-02-17 HISTORY — DX: Shortness of breath: R06.02

## 2021-02-17 HISTORY — PX: IUD REMOVAL: SHX5392

## 2021-02-17 HISTORY — DX: Generalized anxiety disorder: F41.1

## 2021-02-17 HISTORY — DX: Other constipation: K59.09

## 2021-02-17 HISTORY — DX: Nausea: R11.0

## 2021-02-17 HISTORY — DX: Malignant neoplasm of upper-outer quadrant of right female breast: C50.411

## 2021-02-17 LAB — TYPE AND SCREEN
ABO/RH(D): O POS
Antibody Screen: NEGATIVE

## 2021-02-17 LAB — POCT PREGNANCY, URINE: Preg Test, Ur: NEGATIVE

## 2021-02-17 SURGERY — SALPINGO-OOPHORECTOMY, LAPAROSCOPIC
Anesthesia: General | Site: Uterus

## 2021-02-17 MED ORDER — OXYCODONE HCL 5 MG PO TABS
ORAL_TABLET | ORAL | Status: AC
  Filled 2021-02-17: qty 1

## 2021-02-17 MED ORDER — MIDAZOLAM HCL 2 MG/2ML IJ SOLN
INTRAMUSCULAR | Status: DC | PRN
  Administered 2021-02-17: 2 mg via INTRAVENOUS

## 2021-02-17 MED ORDER — PROPOFOL 500 MG/50ML IV EMUL
INTRAVENOUS | Status: DC | PRN
  Administered 2021-02-17: 50 ug/kg/min via INTRAVENOUS

## 2021-02-17 MED ORDER — LACTATED RINGERS IV SOLN: INTRAVENOUS | Status: DC

## 2021-02-17 MED ORDER — ROCURONIUM BROMIDE 100 MG/10ML IV SOLN
INTRAVENOUS | Status: DC | PRN
  Administered 2021-02-17: 5 mg via INTRAVENOUS
  Administered 2021-02-17: 10 mg via INTRAVENOUS
  Administered 2021-02-17: 40 mg via INTRAVENOUS
  Administered 2021-02-17: 5 mg via INTRAVENOUS

## 2021-02-17 MED ORDER — PROMETHAZINE HCL 25 MG/ML IJ SOLN: 6.2500 mg | INTRAMUSCULAR | Status: DC | PRN

## 2021-02-17 MED ORDER — KETOROLAC TROMETHAMINE 30 MG/ML IJ SOLN
INTRAMUSCULAR | Status: AC
  Filled 2021-02-17: qty 4

## 2021-02-17 MED ORDER — KETAMINE HCL 50 MG/5ML IJ SOSY
PREFILLED_SYRINGE | INTRAMUSCULAR | Status: AC
  Filled 2021-02-17: qty 5

## 2021-02-17 MED ORDER — ONDANSETRON HCL 4 MG/2ML IJ SOLN
INTRAMUSCULAR | Status: DC | PRN
  Administered 2021-02-17: 4 mg via INTRAVENOUS

## 2021-02-17 MED ORDER — FENTANYL CITRATE (PF) 250 MCG/5ML IJ SOLN
INTRAMUSCULAR | Status: AC
  Filled 2021-02-17: qty 5

## 2021-02-17 MED ORDER — FENTANYL CITRATE (PF) 100 MCG/2ML IJ SOLN
INTRAMUSCULAR | Status: DC | PRN
Start: 2021-02-17 — End: 2021-02-17
  Administered 2021-02-17 (×2): 50 ug via INTRAVENOUS

## 2021-02-17 MED ORDER — PROPOFOL 10 MG/ML IV BOLUS
INTRAVENOUS | Status: DC | PRN
  Administered 2021-02-17: 40 mg via INTRAVENOUS
  Administered 2021-02-17: 200 mg via INTRAVENOUS
  Administered 2021-02-17: 40 mg via INTRAVENOUS

## 2021-02-17 MED ORDER — FENTANYL CITRATE (PF) 100 MCG/2ML IJ SOLN
INTRAMUSCULAR | Status: AC
  Filled 2021-02-17: qty 2

## 2021-02-17 MED ORDER — MORPHINE SULFATE ER 15 MG PO TBCR: 30.0000 mg | EXTENDED_RELEASE_TABLET | Freq: Three times a day (TID) | ORAL | 0 refills | Status: DC

## 2021-02-17 MED ORDER — KETAMINE HCL 10 MG/ML IJ SOLN
INTRAMUSCULAR | Status: DC | PRN
  Administered 2021-02-17: 20 mg via INTRAVENOUS

## 2021-02-17 MED ORDER — KETOROLAC TROMETHAMINE 30 MG/ML IJ SOLN: 30.0000 mg | Freq: Once | INTRAMUSCULAR | Status: DC | PRN

## 2021-02-17 MED ORDER — SUGAMMADEX SODIUM 200 MG/2ML IV SOLN
INTRAVENOUS | Status: DC | PRN
Start: 2021-02-17 — End: 2021-02-17
  Administered 2021-02-17: 150 mg via INTRAVENOUS

## 2021-02-17 MED ORDER — KETOROLAC TROMETHAMINE 30 MG/ML IJ SOLN
INTRAMUSCULAR | Status: DC | PRN
  Administered 2021-02-17: 30 mg via INTRAVENOUS

## 2021-02-17 MED ORDER — BUPIVACAINE HCL (PF) 0.25 % IJ SOLN
INTRAMUSCULAR | Status: DC | PRN
  Administered 2021-02-17: 7 mL

## 2021-02-17 MED ORDER — DEXAMETHASONE SODIUM PHOSPHATE 4 MG/ML IJ SOLN
INTRAMUSCULAR | Status: DC | PRN
  Administered 2021-02-17: 10 mg via INTRAVENOUS

## 2021-02-17 MED ORDER — OXYCODONE HCL 5 MG PO TABS
5.0000 mg | ORAL_TABLET | Freq: Once | ORAL | Status: AC | PRN
  Administered 2021-02-17: 5 mg via ORAL

## 2021-02-17 MED ORDER — ACETAMINOPHEN 500 MG PO TABS
1000.0000 mg | ORAL_TABLET | Freq: Once | ORAL | Status: AC
  Administered 2021-02-17: 1000 mg via ORAL

## 2021-02-17 MED ORDER — FENTANYL CITRATE (PF) 100 MCG/2ML IJ SOLN
25.0000 ug | INTRAMUSCULAR | Status: DC | PRN
  Administered 2021-02-17 (×2): 50 ug via INTRAVENOUS

## 2021-02-17 MED ORDER — SCOPOLAMINE 1 MG/3DAYS TD PT72
1.0000 | MEDICATED_PATCH | TRANSDERMAL | Status: DC
  Administered 2021-02-17: 1.5 mg via TRANSDERMAL

## 2021-02-17 MED ORDER — PROPOFOL 500 MG/50ML IV EMUL
INTRAVENOUS | Status: AC
  Filled 2021-02-17: qty 50

## 2021-02-17 MED ORDER — LIDOCAINE HCL (CARDIAC) PF 100 MG/5ML IV SOSY
PREFILLED_SYRINGE | INTRAVENOUS | Status: DC | PRN
  Administered 2021-02-17: 60 mg via INTRAVENOUS

## 2021-02-17 MED ORDER — EPHEDRINE 5 MG/ML INJ
INTRAVENOUS | Status: AC
  Filled 2021-02-17: qty 5

## 2021-02-17 MED ORDER — 0.9 % SODIUM CHLORIDE (POUR BTL) OPTIME
TOPICAL | Status: DC | PRN
  Administered 2021-02-17: 500 mL

## 2021-02-17 MED ORDER — FENTANYL CITRATE (PF) 250 MCG/5ML IJ SOLN
INTRAMUSCULAR | Status: DC | PRN
  Administered 2021-02-17: 50 ug via INTRAVENOUS
  Administered 2021-02-17: 100 ug via INTRAVENOUS

## 2021-02-17 MED ORDER — MIDAZOLAM HCL 2 MG/2ML IJ SOLN
INTRAMUSCULAR | Status: AC
  Filled 2021-02-17: qty 2

## 2021-02-17 MED ORDER — SCOPOLAMINE 1 MG/3DAYS TD PT72
MEDICATED_PATCH | TRANSDERMAL | Status: AC
  Filled 2021-02-17: qty 1

## 2021-02-17 MED ORDER — MORPHINE SULFATE ER 30 MG PO TBCR
30.0000 mg | EXTENDED_RELEASE_TABLET | Freq: Once | ORAL | Status: AC
  Administered 2021-02-17: 30 mg via ORAL
  Filled 2021-02-17: qty 1

## 2021-02-17 MED ORDER — OXYCODONE HCL 5 MG/5ML PO SOLN: 5.0000 mg | Freq: Once | ORAL | Status: AC | PRN

## 2021-02-17 MED ORDER — AMISULPRIDE (ANTIEMETIC) 5 MG/2ML IV SOLN: 10.0000 mg | Freq: Once | INTRAVENOUS | Status: DC | PRN

## 2021-02-17 MED ORDER — ACETAMINOPHEN 500 MG PO TABS
ORAL_TABLET | ORAL | Status: AC
  Filled 2021-02-17: qty 2

## 2021-02-17 SURGICAL SUPPLY — 49 items
ADH SKN CLS APL DERMABOND .7 (GAUZE/BANDAGES/DRESSINGS) ×2
APL SKNCLS STERI-STRIP NONHPOA (GAUZE/BANDAGES/DRESSINGS)
APL SWBSTK 6 STRL LF DISP (MISCELLANEOUS)
APPLICATOR COTTON TIP 6 STRL (MISCELLANEOUS) IMPLANT
APPLICATOR COTTON TIP 6IN STRL (MISCELLANEOUS)
BAG SPEC RTRVL LRG 6X4 10 (ENDOMECHANICALS) ×2
BARRIER ADHS 3X4 INTERCEED (GAUZE/BANDAGES/DRESSINGS) IMPLANT
BENZOIN TINCTURE PRP APPL 2/3 (GAUZE/BANDAGES/DRESSINGS) IMPLANT
BRR ADH 4X3 ABS CNTRL BYND (GAUZE/BANDAGES/DRESSINGS)
CANISTER SUCT 1200ML W/VALVE (MISCELLANEOUS) IMPLANT
CATH ROBINSON RED A/P 14FR (CATHETERS) ×3 IMPLANT
COVER MAYO STAND STRL (DRAPES) ×3 IMPLANT
DERMABOND ADVANCED (GAUZE/BANDAGES/DRESSINGS) ×1
DERMABOND ADVANCED .7 DNX12 (GAUZE/BANDAGES/DRESSINGS) IMPLANT
DURAPREP 26ML APPLICATOR (WOUND CARE) ×3 IMPLANT
ELECT REM PT RETURN 9FT ADLT (ELECTROSURGICAL) ×3
ELECTRODE REM PT RTRN 9FT ADLT (ELECTROSURGICAL) ×2 IMPLANT
FORCEPS CUTTING 45CM 5MM (CUTTING FORCEPS) IMPLANT
GAUZE 4X4 16PLY ~~LOC~~+RFID DBL (SPONGE) ×6 IMPLANT
GLOVE SURG POLYISO LF SZ5.5 (GLOVE) ×3 IMPLANT
GLOVE SURG UNDER POLY LF SZ6 (GLOVE) ×6 IMPLANT
GOWN STRL REUS W/TWL LRG LVL3 (GOWN DISPOSABLE) ×9 IMPLANT
KIT TURNOVER CYSTO (KITS) ×3 IMPLANT
LIGASURE LAP L-HOOKWIRE 5 44CM (INSTRUMENTS) ×1 IMPLANT
NEEDLE INSUFFLATION 120MM (ENDOMECHANICALS) ×3 IMPLANT
NS IRRIG 500ML POUR BTL (IV SOLUTION) ×3 IMPLANT
PACK LAPAROSCOPY BASIN (CUSTOM PROCEDURE TRAY) ×3 IMPLANT
PACK TRENDGUARD 450 HYBRID PRO (MISCELLANEOUS) ×2 IMPLANT
PAD OB MATERNITY 4.3X12.25 (PERSONAL CARE ITEMS) ×3 IMPLANT
PENCIL SMOKE EVACUATOR (MISCELLANEOUS) IMPLANT
POUCH SPECIMEN RETRIEVAL 10MM (ENDOMECHANICALS) ×1 IMPLANT
SCISSORS LAP 5X35 DISP (ENDOMECHANICALS) IMPLANT
SEALER TISSUE G2 CVD JAW 45CM (ENDOMECHANICALS) IMPLANT
SET SUCTION IRRIG HYDROSURG (IRRIGATION / IRRIGATOR) IMPLANT
SET TUBE SMOKE EVAC HIGH FLOW (TUBING) ×3 IMPLANT
STRIP CLOSURE SKIN 1/2X4 (GAUZE/BANDAGES/DRESSINGS) IMPLANT
STRIP CLOSURE SKIN 1/4X4 (GAUZE/BANDAGES/DRESSINGS) IMPLANT
SUT MNCRL AB 3-0 PS2 18 (SUTURE) ×3 IMPLANT
SUT VICRYL 0 UR6 27IN ABS (SUTURE) ×3 IMPLANT
SYR 10ML LL (SYRINGE) ×3 IMPLANT
SYR 20ML LL LF (SYRINGE) IMPLANT
TOWEL OR 17X26 10 PK STRL BLUE (TOWEL DISPOSABLE) ×6 IMPLANT
TRENDGUARD 450 HYBRID PRO PACK (MISCELLANEOUS) ×3
TROCAR BLADELESS OPT 5 100 (ENDOMECHANICALS) ×3 IMPLANT
TROCAR XCEL NON-BLD 11X100MML (ENDOMECHANICALS) ×3 IMPLANT
TUBE CONNECTING 12X1/4 (SUCTIONS) IMPLANT
TUBING EXTENTION W/L.L. (IV SETS) IMPLANT
WARMER LAPAROSCOPE (MISCELLANEOUS) ×3 IMPLANT
WATER STERILE IRR 500ML POUR (IV SOLUTION) ×3 IMPLANT

## 2021-02-17 NOTE — Progress Notes (Addendum)
Patient states since last OV, she started palliative radiation therapy one week ago to treat rib and spine pain.  She was also recently switched to [facility] which she states has been well tolerated short of initial nausea and vomiting.  She was also started on narcotics to help with this pain (Oxycontin).  Patient reports no chest pain and mild SOB.  O2 sat this AM 100%.  I spoke with anesthesia regarding patient's hx and they will assess.  Otherwise, no new medical dx or changes in management.     Patient would like IUD removed today.  Linda Hedges, DO

## 2021-02-17 NOTE — Discharge Instructions (Addendum)
Call MD for T>100.4, heavy vaginal bleeding, severe abdominal pain, intractable nausea and/or vomiting, or respiratory distress.  Call office to schedule postop appointment in 2 weeks.  Pelvic rest x 4 weeks.  No driving while taking narcotics.  DISCHARGE INSTRUCTIONS: Laparoscopy  The following instructions have been prepared to help you care for yourself upon your return home today.  Wound care:  Do not get the incision wet for the first 24 hours. The incision should be kept clean and dry.  The Band-Aids or dressings may be removed the day after surgery.  Should the incision become sore, red, and swollen after the first week, check with your doctor.  Personal hygiene:  Shower the day after your procedure.  Activity and limitations:  Do NOT drive or operate any equipment today.  Do NOT lift anything more than 15 pounds for 2-3 weeks after surgery.  Do NOT rest in bed all day.  Walking is encouraged. Walk each day, starting slowly with 5-minute walks 3 or 4 times a day. Slowly increase the length of your walks.  Walk up and down stairs slowly.  Do NOT do strenuous activities, such as golfing, playing tennis, bowling, running, biking, weight lifting, gardening, mowing, or vacuuming for 2-4 weeks. Ask your doctor when it is okay to start.  Diet: Eat a light meal as desired this evening. You may resume your usual diet tomorrow.  Return to work: This is dependent on the type of work you do. For the most part you can return to a desk job within a week of surgery. If you are more active at work, please discuss this with your doctor.  What to expect after your surgery: You may have a slight burning sensation when you urinate on the first day. You may have a very small amount of blood in the urine. Expect to have a small amount of vaginal discharge/light bleeding for 1-2 weeks. It is not unusual to have abdominal soreness and bruising for up to 2 weeks. You may be tired and need more rest for about  1 week. You may experience shoulder pain for 24-72 hours. Lying flat in bed may relieve it.  Call your doctor for any of the following:  Develop a fever of 100.4 or greater  Inability to urinate 6 hours after discharge from hospital  Severe pain not relieved by pain medications  Persistent of heavy bleeding at incision site  Redness or swelling around incision site after a week  Increasing nausea or vomiting   Post Anesthesia Home Care Instructions  Activity: Get plenty of rest for the remainder of the day. A responsible individual must stay with you for 24 hours following the procedure.  For the next 24 hours, DO NOT: -Drive a car -Paediatric nurse -Drink alcoholic beverages -Take any medication unless instructed by your physician -Make any legal decisions or sign important papers.  Meals: Start with liquid foods such as gelatin or soup. Progress to regular foods as tolerated. Avoid greasy, spicy, heavy foods. If nausea and/or vomiting occur, drink only clear liquids until the nausea and/or vomiting subsides. Call your physician if vomiting continues.  Special Instructions/Symptoms: Your throat may feel dry or sore from the anesthesia or the breathing tube placed in your throat during surgery. If this causes discomfort, gargle with warm salt water. The discomfort should disappear within 24 hours.  If you had a scopolamine patch placed behind your ear for the management of post- operative nausea and/or vomiting:  1. The medication in  the patch is effective for 72 hours, after which it should be removed.  Wrap patch in a tissue and discard in the trash. Wash hands thoroughly with soap and water. 2. You may remove the patch earlier than 72 hours if you experience unpleasant side effects which may include dry mouth, dizziness or visual disturbances. 3. Avoid touching the patch. Wash your hands with soap and water after contact with the patch.  No acetaminophen/Tylenol until after  12:30 pm today if needed. No ibuprofen, Advil, Aleve, Motrin, ketorolac, meloxicam, naproxen, or other NSAIDS until after 2:30 pm today if needed.

## 2021-02-17 NOTE — Anesthesia Procedure Notes (Signed)
Procedure Name: Intubation Date/Time: 02/17/2021 7:36 AM Performed by: Georgeanne Nim, CRNA Pre-anesthesia Checklist: Patient identified, Emergency Drugs available, Suction available, Patient being monitored and Timeout performed Patient Re-evaluated:Patient Re-evaluated prior to induction Oxygen Delivery Method: Circle system utilized Preoxygenation: Pre-oxygenation with 100% oxygen Induction Type: IV induction Ventilation: Mask ventilation without difficulty Laryngoscope Size: Mac and 4 Grade View: Grade I Tube type: Oral Tube size: 7.0 mm Number of attempts: 1 Airway Equipment and Method: Stylet Placement Confirmation: ETT inserted through vocal cords under direct vision, positive ETCO2, CO2 detector and breath sounds checked- equal and bilateral Secured at: 21 cm Tube secured with: Tape Dental Injury: Teeth and Oropharynx as per pre-operative assessment

## 2021-02-17 NOTE — Transfer of Care (Signed)
Immediate Anesthesia Transfer of Care Note  Patient: Kaylee Romero  Procedure(s) Performed: LAPAROSCOPIC BILATERAL SALPINGO OOPHORECTOMY (Bilateral: Uterus) INTRAUTERINE DEVICE (IUD) REMOVAL (Uterus)  Patient Location: PACU  Anesthesia Type:General  Level of Consciousness: awake, alert , oriented and patient cooperative  Airway & Oxygen Therapy: Patient Spontanous Breathing and Patient connected to nasal cannula oxygen  Post-op Assessment: Report given to RN and Post -op Vital signs reviewed and stable  Post vital signs: Reviewed and stable  Last Vitals:  Vitals Value Taken Time  BP 93/61 02/17/21 0900  Temp    Pulse 87 02/17/21 0900  Resp 22 02/17/21 0900  SpO2 93 % 02/17/21 0900  Vitals shown include unvalidated device data.  Last Pain:  Vitals:   02/17/21 0618  TempSrc: Oral  PainSc: 4       Patients Stated Pain Goal: 3 (24/81/85 9093)  Complications: No notable events documented.

## 2021-02-17 NOTE — Op Note (Addendum)
Kaylee Romero 02/17/2021  PREOPERATIVE DIAGNOSIS:  Stage IV breast cancer ER+/PR+  POSTOPERATIVE DIAGNOSIS:  same as above  PROCEDURE:  Laparoscopic bilateral salpingo-oophorectomy, Mirena IUD removal  ANESTHESIA:  General endotracheal  PATHOLOGY:  Bilateral fallopian tubes and ovaries  COMPLICATIONS:  None immediate.  ESTIMATED BLOOD LOSS:  Less than 20 ml.  INDICATIONS: 41 y.o.  with breast cancer ER+/PR+ with desire for ovarian removal and IUD removal to eliminate need for hormonal suppression during treatment.    FINDINGS:  Normal uterus, normal bilateral fallopian tubes and ovaries.  Diffusely distributed peritoneal nodules.  TECHNIQUE:  The patient was taken to the operating room where general anesthesia was obtained without difficulty.  She was then placed in the dorsal lithotomy position and prepared and draped in sterile fashion.  After an adequate timeout was performed, a bivalved speculum was then placed in the patient's vagina and IUD was easily removed.  The anterior lip of cervix was grasped with the single-tooth tenaculum.  The acorn uterine manipulator was placed.  The speculum was removed from the vagina.  Attention was then turned to the patient's abdomen where a 10-mm skin incision was made on the umbilical fold.  The Veress needle was carefully introduced into the peritoneal cavity through the abdominal wall.  Intraperitoneal placement was confirmed by drop in intraabdominal pressure with insufflation of carbon dioxide gas.  Adequate pneumoperitoneum was obtained, and the 10/11 XL trocar was then advanced without difficulty into the abdomen where intraabdominal placement was confirmed by the operative laparoscope.  The above dictated findings were noted; normal gynecologic anatomy was seen.  A 5 mm skin incision was made and trocar was advanced under direct visualization.  The right fallopian tube was elevated at the fimbriated end and using the Ligasure, the fallopian  tube was freed from the mesosalpinx.   The infundibulopelvic and uteroovarian ligaments were also clamped, coagulated and transected using the Ligasure.  Hemostasis was observed.  The left sided fallopian tube and ovary were treated in the same fashion.  Hemostasis was observed.  The Endocatch bag was introduced and both fallopian tubes and ovaries were removed from the umbilical incision.    Hemostasis was reconfirmed.  All instruments were removed from the abdomen.  The infraumbilical fascial incision was closed with 0 vicryl in a figure of eight stitch.  Both skin incisions were closed with 3-0 monocryl in a subcuticular fashion.    The uterine manipulator and the tenaculum were removed from the vagina without complications. The patient tolerated the procedure well.  Sponge, lap, and needle counts were correct times two.  The patient was then taken to the recovery room awake, extubated and in stable  in stable condition.

## 2021-02-17 NOTE — Progress Notes (Signed)
IUD out intact by Dr. Lynnette Caffey

## 2021-02-17 NOTE — Anesthesia Postprocedure Evaluation (Signed)
Anesthesia Post Note  Patient: Kaylee Romero  Procedure(s) Performed: LAPAROSCOPIC BILATERAL SALPINGO OOPHORECTOMY (Bilateral: Uterus) INTRAUTERINE DEVICE (IUD) REMOVAL (Uterus)     Patient location during evaluation: PACU Anesthesia Type: General Level of consciousness: awake Pain management: pain level controlled Vital Signs Assessment: post-procedure vital signs reviewed and stable Respiratory status: spontaneous breathing, nonlabored ventilation, respiratory function stable and patient connected to nasal cannula oxygen Cardiovascular status: blood pressure returned to baseline and stable Postop Assessment: no apparent nausea or vomiting Anesthetic complications: no   No notable events documented.  Last Vitals:  Vitals:   02/17/21 1030 02/17/21 1130  BP: 116/73 101/66  Pulse: 87 98  Resp: 14 15  Temp: 36.7 C 36.7 C  SpO2: 96% 95%    Last Pain:  Vitals:   02/17/21 1130  TempSrc:   PainSc: 3                  Lexianna Weinrich P Raydell Maners

## 2021-02-18 ENCOUNTER — Encounter (HOSPITAL_BASED_OUTPATIENT_CLINIC_OR_DEPARTMENT_OTHER): Payer: Self-pay | Admitting: Obstetrics & Gynecology

## 2021-02-19 ENCOUNTER — Ambulatory Visit
Admission: RE | Admit: 2021-02-19 | Discharge: 2021-02-19 | Disposition: A | Discharge status: Home / Self Care | Payer: BC Managed Care – PPO | Source: Ambulatory Visit | Attending: Radiation Oncology | Admitting: Radiation Oncology

## 2021-02-19 DIAGNOSIS — Z510 Encounter for antineoplastic radiation therapy: Secondary | ICD-10-CM | POA: Diagnosis not present

## 2021-02-19 LAB — SURGICAL PATHOLOGY

## 2021-02-20 ENCOUNTER — Ambulatory Visit
Admission: RE | Admit: 2021-02-20 | Discharge: 2021-02-20 | Disposition: A | Discharge status: Home / Self Care | Payer: BC Managed Care – PPO | Source: Ambulatory Visit | Attending: Radiation Oncology | Admitting: Radiation Oncology

## 2021-02-20 ENCOUNTER — Other Ambulatory Visit: Payer: Self-pay | Admitting: Emergency Medicine

## 2021-02-20 DIAGNOSIS — C50411 Malignant neoplasm of upper-outer quadrant of right female breast: Secondary | ICD-10-CM

## 2021-02-20 DIAGNOSIS — Z510 Encounter for antineoplastic radiation therapy: Secondary | ICD-10-CM | POA: Diagnosis not present

## 2021-02-20 MED ORDER — PROMETHAZINE HCL 25 MG PO TABS: 25.0000 mg | ORAL_TABLET | Freq: Four times a day (QID) | ORAL | 2 refills | Status: AC | PRN

## 2021-02-20 NOTE — Progress Notes (Signed)
Verbal order placed for phenergan d/t nausea unrelieved by zofran.

## 2021-02-21 ENCOUNTER — Ambulatory Visit
Admission: RE | Admit: 2021-02-21 | Discharge: 2021-02-21 | Disposition: A | Discharge status: Home / Self Care | Payer: BC Managed Care – PPO | Source: Ambulatory Visit | Attending: Oncology | Admitting: Oncology

## 2021-02-21 ENCOUNTER — Other Ambulatory Visit: Payer: Self-pay | Admitting: Emergency Medicine

## 2021-02-21 ENCOUNTER — Encounter: Payer: Self-pay | Admitting: Emergency Medicine

## 2021-02-21 ENCOUNTER — Telehealth: Payer: Self-pay | Admitting: *Deleted

## 2021-02-21 ENCOUNTER — Ambulatory Visit
Admission: RE | Admit: 2021-02-21 | Discharge: 2021-02-21 | Disposition: A | Discharge status: Home / Self Care | Payer: BC Managed Care – PPO | Source: Ambulatory Visit | Attending: Radiation Oncology | Admitting: Radiation Oncology

## 2021-02-21 ENCOUNTER — Ambulatory Visit
Admission: RE | Admit: 2021-02-21 | Discharge: 2021-02-21 | Disposition: A | Discharge status: Home / Self Care | Payer: BC Managed Care – PPO | Source: Home / Self Care | Attending: Oncology | Admitting: Oncology

## 2021-02-21 DIAGNOSIS — A419 Sepsis, unspecified organism: Secondary | ICD-10-CM | POA: Diagnosis not present

## 2021-02-21 DIAGNOSIS — J189 Pneumonia, unspecified organism: Secondary | ICD-10-CM | POA: Diagnosis not present

## 2021-02-21 DIAGNOSIS — Z510 Encounter for antineoplastic radiation therapy: Secondary | ICD-10-CM | POA: Diagnosis not present

## 2021-02-21 DIAGNOSIS — Z170 Estrogen receptor positive status [ER+]: Secondary | ICD-10-CM

## 2021-02-21 DIAGNOSIS — C50411 Malignant neoplasm of upper-outer quadrant of right female breast: Secondary | ICD-10-CM | POA: Insufficient documentation

## 2021-02-21 MED ORDER — LEVOFLOXACIN 500 MG PO TABS: 500.0000 mg | ORAL_TABLET | Freq: Every day | ORAL | 0 refills | Status: DC

## 2021-02-21 NOTE — Telephone Encounter (Signed)
Per radiologist, there was not enough fluid present on xray to warrant stat thoracentesis; more concerned about consolidation present. Pleural Effusion vs beginning of infection. Per Dr. Grayland Ormond, rx for Levaquin 500mg  daily for 7 days was sent to pharmacy with instructions for patient to contact the office Monday or Tuesday if still symptomatic to re-evaluate need for thoracentesis. Pt verbalized understanding.

## 2021-02-21 NOTE — Addendum Note (Signed)
Addended by: Wilford Corner on: 02/21/2021 03:15 PM   Modules accepted: Orders

## 2021-02-21 NOTE — Telephone Encounter (Signed)
Pt updated that xray results are still pending and will place orders for thoracentesis if needed. Informed that I could not guarantee thoracentesis, if needed, will be scheduled today but will try to accomodate.

## 2021-02-21 NOTE — Progress Notes (Signed)
Levaquin ordered per Dr. Grayland Ormond for possible infection evidenced by areas of consolidation seen on chest xray. Pt instructed to begin abx and contact the office on Monday or Tuesday if not feeling any better and would consider thoracentesis if still symptomatic.

## 2021-02-21 NOTE — Telephone Encounter (Signed)
Patient called stating that if her CXR done today comes back that she needs fluid drawn off, she is requesting to have it done today so she is not uncomfortable all weekend long

## 2021-02-21 NOTE — Progress Notes (Deleted)
Opened in error

## 2021-02-23 ENCOUNTER — Other Ambulatory Visit: Payer: Self-pay

## 2021-02-23 ENCOUNTER — Inpatient Hospital Stay
Admission: EM | Admit: 2021-02-23 | Discharge: 2021-02-26 | DRG: 871 | Disposition: A | Discharge status: Home / Self Care | Payer: BC Managed Care – PPO | Attending: Family Medicine | Admitting: Family Medicine

## 2021-02-23 ENCOUNTER — Encounter: Payer: Self-pay | Admitting: Emergency Medicine

## 2021-02-23 ENCOUNTER — Emergency Department: Payer: BC Managed Care – PPO

## 2021-02-23 DIAGNOSIS — E872 Acidosis, unspecified: Secondary | ICD-10-CM | POA: Diagnosis present

## 2021-02-23 DIAGNOSIS — Z888 Allergy status to other drugs, medicaments and biological substances status: Secondary | ICD-10-CM

## 2021-02-23 DIAGNOSIS — F909 Attention-deficit hyperactivity disorder, unspecified type: Secondary | ICD-10-CM | POA: Diagnosis present

## 2021-02-23 DIAGNOSIS — J189 Pneumonia, unspecified organism: Secondary | ICD-10-CM | POA: Diagnosis present

## 2021-02-23 DIAGNOSIS — R0600 Dyspnea, unspecified: Secondary | ICD-10-CM

## 2021-02-23 DIAGNOSIS — G893 Neoplasm related pain (acute) (chronic): Secondary | ICD-10-CM

## 2021-02-23 DIAGNOSIS — C50411 Malignant neoplasm of upper-outer quadrant of right female breast: Secondary | ICD-10-CM | POA: Diagnosis present

## 2021-02-23 DIAGNOSIS — Z79899 Other long term (current) drug therapy: Secondary | ICD-10-CM | POA: Diagnosis not present

## 2021-02-23 DIAGNOSIS — C7801 Secondary malignant neoplasm of right lung: Secondary | ICD-10-CM | POA: Diagnosis present

## 2021-02-23 DIAGNOSIS — D84821 Immunodeficiency due to drugs: Secondary | ICD-10-CM | POA: Diagnosis present

## 2021-02-23 DIAGNOSIS — R0789 Other chest pain: Secondary | ICD-10-CM | POA: Diagnosis not present

## 2021-02-23 DIAGNOSIS — Z87891 Personal history of nicotine dependence: Secondary | ICD-10-CM

## 2021-02-23 DIAGNOSIS — I214 Non-ST elevation (NSTEMI) myocardial infarction: Secondary | ICD-10-CM

## 2021-02-23 DIAGNOSIS — Z170 Estrogen receptor positive status [ER+]: Secondary | ICD-10-CM | POA: Diagnosis not present

## 2021-02-23 DIAGNOSIS — Z90722 Acquired absence of ovaries, bilateral: Secondary | ICD-10-CM

## 2021-02-23 DIAGNOSIS — K5909 Other constipation: Secondary | ICD-10-CM | POA: Diagnosis present

## 2021-02-23 DIAGNOSIS — F988 Other specified behavioral and emotional disorders with onset usually occurring in childhood and adolescence: Secondary | ICD-10-CM | POA: Diagnosis not present

## 2021-02-23 DIAGNOSIS — C7802 Secondary malignant neoplasm of left lung: Secondary | ICD-10-CM | POA: Diagnosis present

## 2021-02-23 DIAGNOSIS — Z8249 Family history of ischemic heart disease and other diseases of the circulatory system: Secondary | ICD-10-CM

## 2021-02-23 DIAGNOSIS — F411 Generalized anxiety disorder: Secondary | ICD-10-CM | POA: Diagnosis present

## 2021-02-23 DIAGNOSIS — C782 Secondary malignant neoplasm of pleura: Secondary | ICD-10-CM | POA: Diagnosis present

## 2021-02-23 DIAGNOSIS — Z20822 Contact with and (suspected) exposure to covid-19: Secondary | ICD-10-CM | POA: Diagnosis present

## 2021-02-23 DIAGNOSIS — C50919 Malignant neoplasm of unspecified site of unspecified female breast: Secondary | ICD-10-CM | POA: Insufficient documentation

## 2021-02-23 DIAGNOSIS — A419 Sepsis, unspecified organism: Secondary | ICD-10-CM | POA: Diagnosis present

## 2021-02-23 DIAGNOSIS — Z803 Family history of malignant neoplasm of breast: Secondary | ICD-10-CM

## 2021-02-23 DIAGNOSIS — R079 Chest pain, unspecified: Secondary | ICD-10-CM

## 2021-02-23 DIAGNOSIS — I10 Essential (primary) hypertension: Secondary | ICD-10-CM | POA: Diagnosis present

## 2021-02-23 DIAGNOSIS — C787 Secondary malignant neoplasm of liver and intrahepatic bile duct: Secondary | ICD-10-CM | POA: Diagnosis present

## 2021-02-23 DIAGNOSIS — Z90710 Acquired absence of both cervix and uterus: Secondary | ICD-10-CM

## 2021-02-23 DIAGNOSIS — C7951 Secondary malignant neoplasm of bone: Secondary | ICD-10-CM | POA: Diagnosis present

## 2021-02-23 DIAGNOSIS — J90 Pleural effusion, not elsewhere classified: Secondary | ICD-10-CM

## 2021-02-23 LAB — TROPONIN I (HIGH SENSITIVITY)
Troponin I (High Sensitivity): 5 ng/L (ref <18)
Troponin I (High Sensitivity): 5 ng/L (ref <18)

## 2021-02-23 LAB — RESP PANEL BY RT-PCR (FLU A&B, COVID) ARPGX2
Influenza A by PCR: NEGATIVE
Influenza B by PCR: NEGATIVE
SARS Coronavirus 2 by RT PCR: NEGATIVE

## 2021-02-23 LAB — URINALYSIS, ROUTINE W REFLEX MICROSCOPIC
Bilirubin Urine: NEGATIVE
Glucose, UA: NEGATIVE mg/dL
Hgb urine dipstick: NEGATIVE
Ketones, ur: NEGATIVE mg/dL
Leukocytes,Ua: NEGATIVE
Nitrite: NEGATIVE
Protein, ur: NEGATIVE mg/dL
Specific Gravity, Urine: 1.01 (ref 1.005–1.030)
pH: 6 (ref 5.0–8.0)

## 2021-02-23 LAB — CBC WITH DIFFERENTIAL/PLATELET
Abs Immature Granulocytes: 0.08 10*3/uL — ABNORMAL HIGH (ref 0.00–0.07)
Basophils Absolute: 0 10*3/uL (ref 0.0–0.1)
Basophils Relative: 0 %
Eosinophils Absolute: 0.1 10*3/uL (ref 0.0–0.5)
Eosinophils Relative: 1 %
HCT: 41.5 % (ref 36.0–46.0)
Hemoglobin: 14.1 g/dL (ref 12.0–15.0)
Immature Granulocytes: 1 %
Lymphocytes Relative: 5 %
Lymphs Abs: 0.8 10*3/uL (ref 0.7–4.0)
MCH: 33.2 pg (ref 26.0–34.0)
MCHC: 34 g/dL (ref 30.0–36.0)
MCV: 97.6 fL (ref 80.0–100.0)
Monocytes Absolute: 0.8 10*3/uL (ref 0.1–1.0)
Monocytes Relative: 6 %
Neutro Abs: 12.6 10*3/uL — ABNORMAL HIGH (ref 1.7–7.7)
Neutrophils Relative %: 87 %
Platelets: 401 10*3/uL — ABNORMAL HIGH (ref 150–400)
RBC: 4.25 MIL/uL (ref 3.87–5.11)
RDW: 12.7 % (ref 11.5–15.5)
WBC: 14.4 10*3/uL — ABNORMAL HIGH (ref 4.0–10.5)
nRBC: 0 % (ref 0.0–0.2)

## 2021-02-23 LAB — PREGNANCY, URINE: Preg Test, Ur: NEGATIVE

## 2021-02-23 LAB — MRSA NEXT GEN BY PCR, NASAL: MRSA by PCR Next Gen: NOT DETECTED

## 2021-02-23 LAB — COMPREHENSIVE METABOLIC PANEL
ALT: 18 U/L (ref 0–44)
AST: 35 U/L (ref 15–41)
Albumin: 3.9 g/dL (ref 3.5–5.0)
Alkaline Phosphatase: 78 U/L (ref 38–126)
Anion gap: 11 (ref 5–15)
BUN: 14 mg/dL (ref 6–20)
CO2: 28 mmol/L (ref 22–32)
Calcium: 9.3 mg/dL (ref 8.9–10.3)
Chloride: 94 mmol/L — ABNORMAL LOW (ref 98–111)
Creatinine, Ser: 0.69 mg/dL (ref 0.44–1.00)
GFR, Estimated: >60 mL/min (ref >60)
Glucose, Bld: 136 mg/dL — ABNORMAL HIGH (ref 70–99)
Potassium: 3.8 mmol/L (ref 3.5–5.1)
Sodium: 133 mmol/L — ABNORMAL LOW (ref 135–145)
Total Bilirubin: 0.5 mg/dL (ref 0.3–1.2)
Total Protein: 7.7 g/dL (ref 6.5–8.1)

## 2021-02-23 LAB — LACTIC ACID, PLASMA
Lactic Acid, Venous: 2.1 mmol/L (ref 0.5–1.9)
Lactic Acid, Venous: 1 mmol/L (ref 0.5–1.9)

## 2021-02-23 LAB — BRAIN NATRIURETIC PEPTIDE: B Natriuretic Peptide: 35.6 pg/mL (ref 0.0–100.0)

## 2021-02-23 LAB — D-DIMER, QUANTITATIVE: D-Dimer, Quant: 2.3 ug/mL-FEU — ABNORMAL HIGH (ref 0.00–0.50)

## 2021-02-23 IMAGING — CT CT ANGIO CHEST
2 of 7 series · 16 of 46 positions shown · IV contrast (APPLIED)
Comparison: Chest radiography same day.  Chest CT [DATE].
COMPARISON: Chest radiography same day.  Chest CT [DATE].

Addendum:
CLINICAL DATA: Pulmonary embolism suspected. High probability.
Metastatic breast cancer. Shortness of breath.

EXAM:
CT ANGIOGRAPHY CHEST WITH CONTRAST
TECHNIQUE: Multidetector CT imaging of the chest was performed using the
standard protocol during bolus administration of intravenous
contrast. Multiplanar CT image reconstructions and MIPs were
obtained to evaluate the vascular anatomy.

[Series 5: thins · axial · 0.71mm/px · z∈[-452,-214]mm · 14 of 267 slices shown]
[im 15/267  lung]
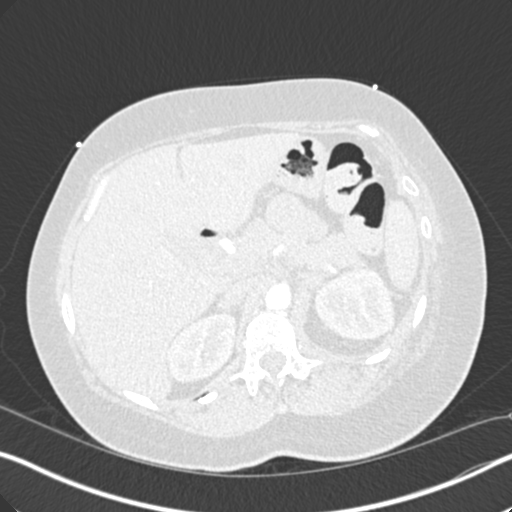
[im 30/267  soft-tissue]
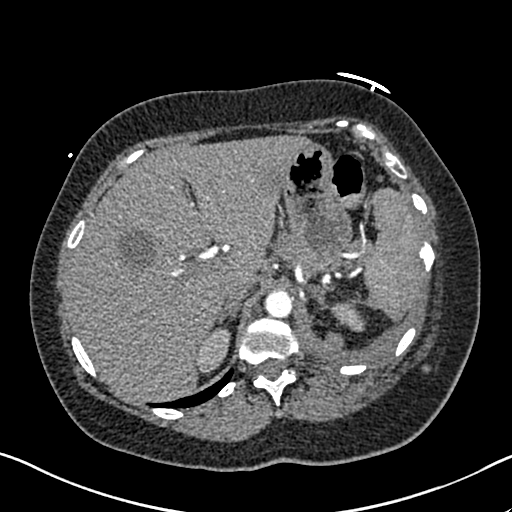
[im 60/267  lung]
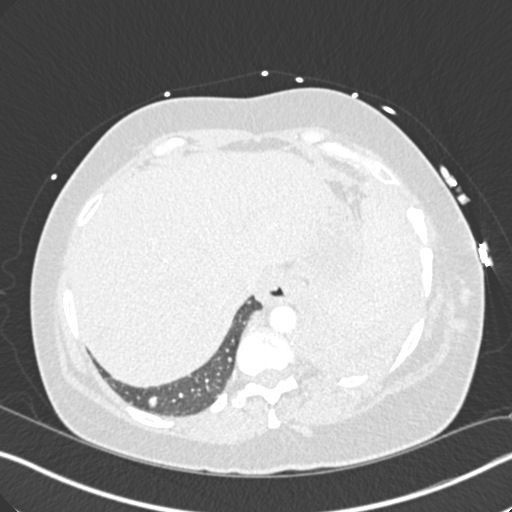
[im 74/267  soft-tissue]
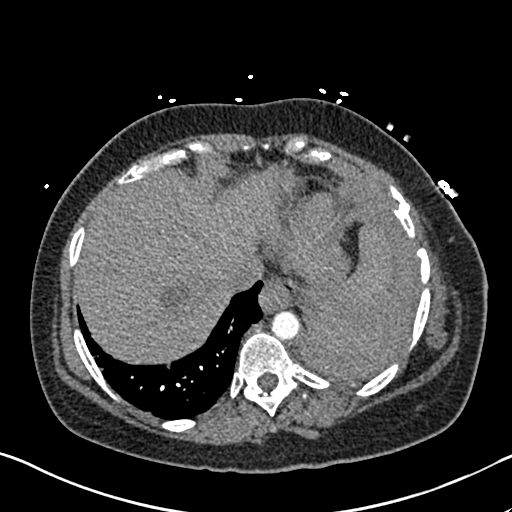
[im 89/267  lung]
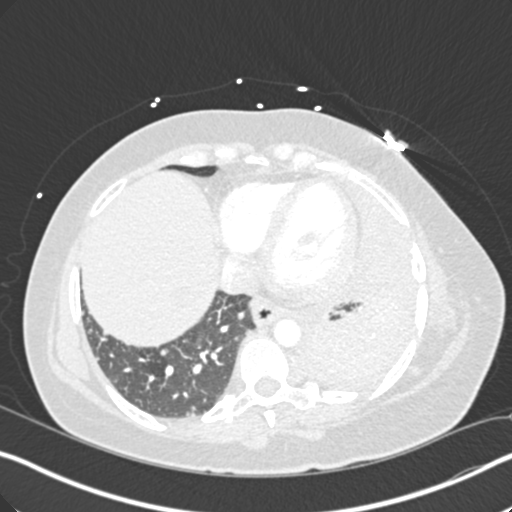
[im 104/267  soft-tissue]
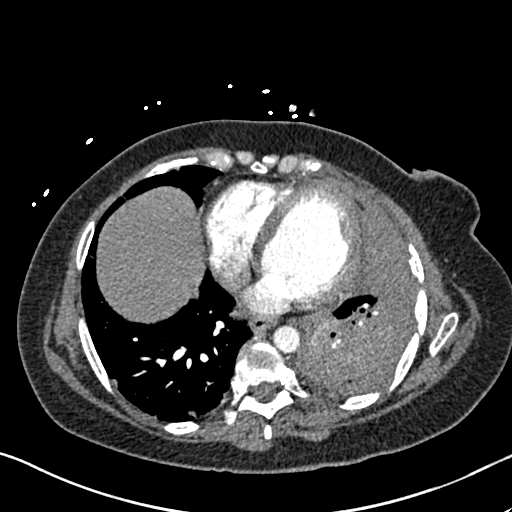
[im 119/267  lung]
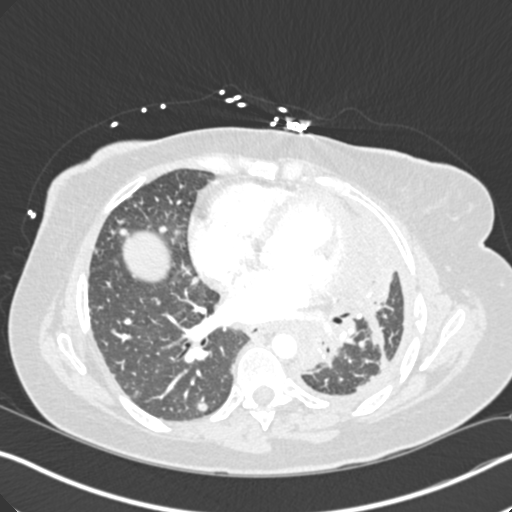
[im 148/267  soft-tissue]
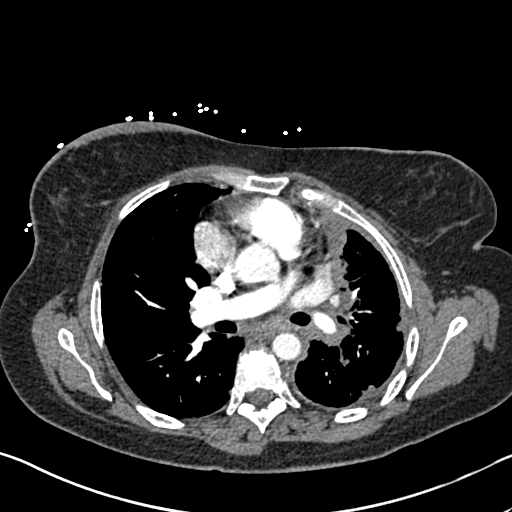
[im 163/267  lung]
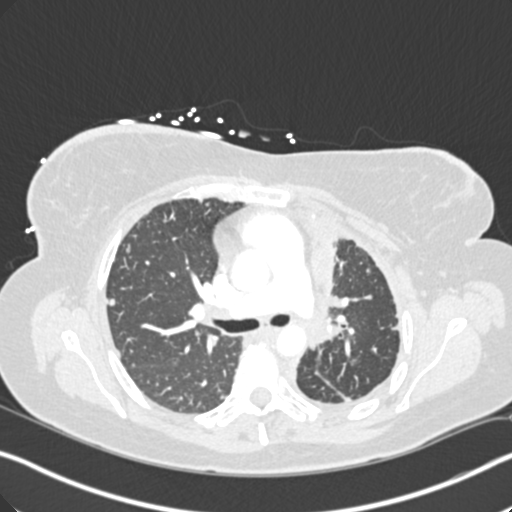
[im 178/267  soft-tissue]
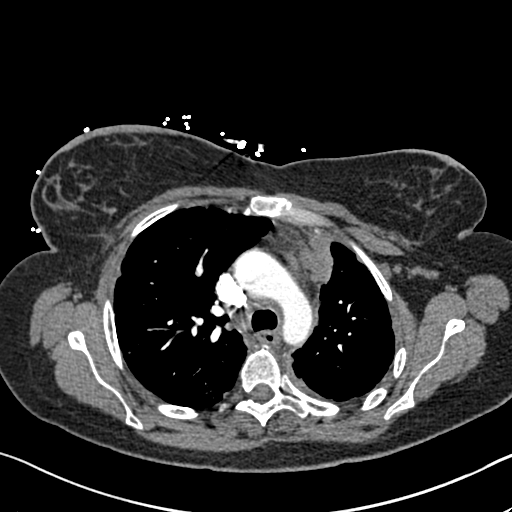
[im 193/267  lung]
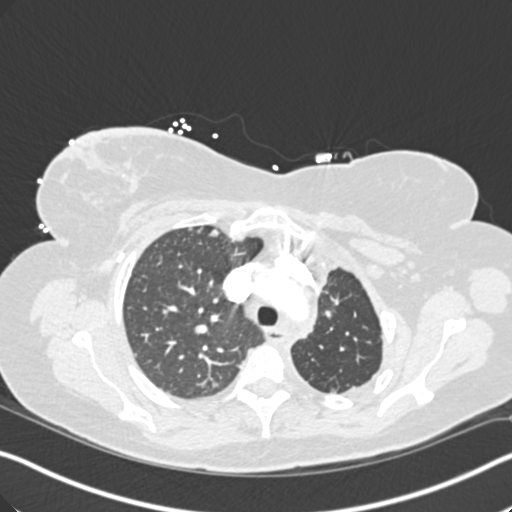
[im 207/267  soft-tissue]
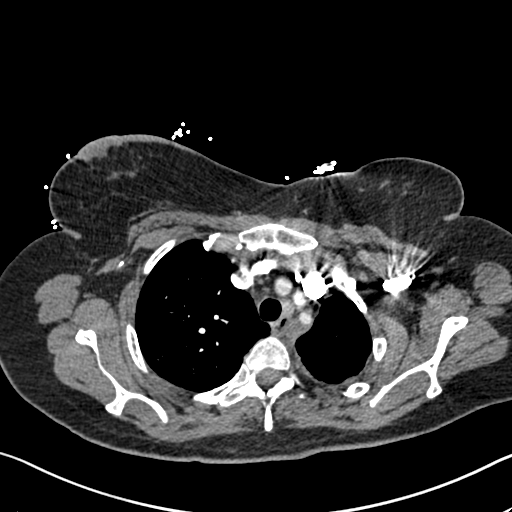
[im 237/267  lung]
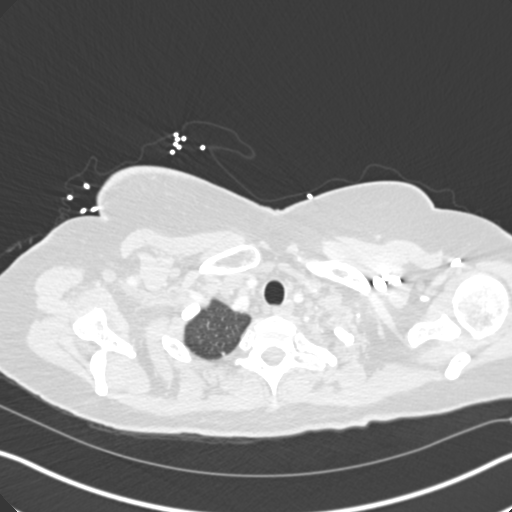
[im 252/267  soft-tissue]
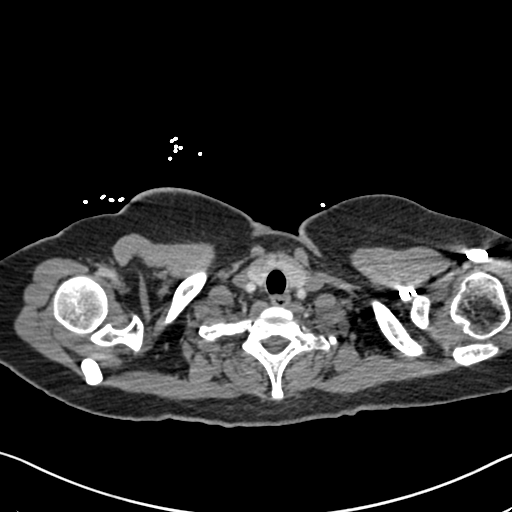

[Series 7: coronal mpr · coronal · 0.55mm/px · 2 of 95 slices shown]
[im 32/95  soft-tissue]
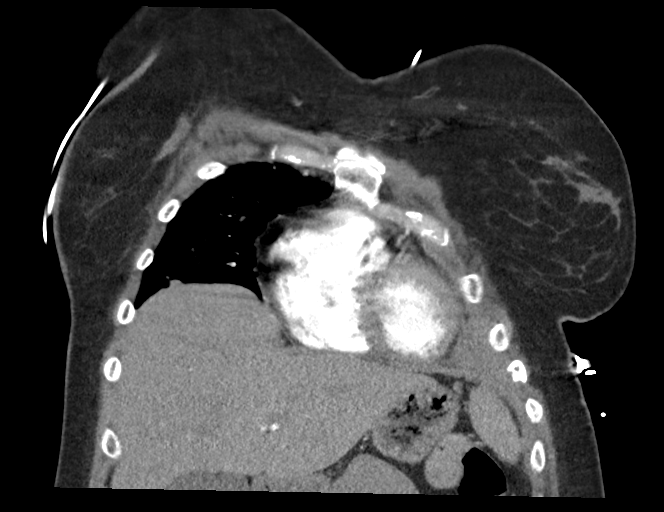
[im 63/95  soft-tissue]
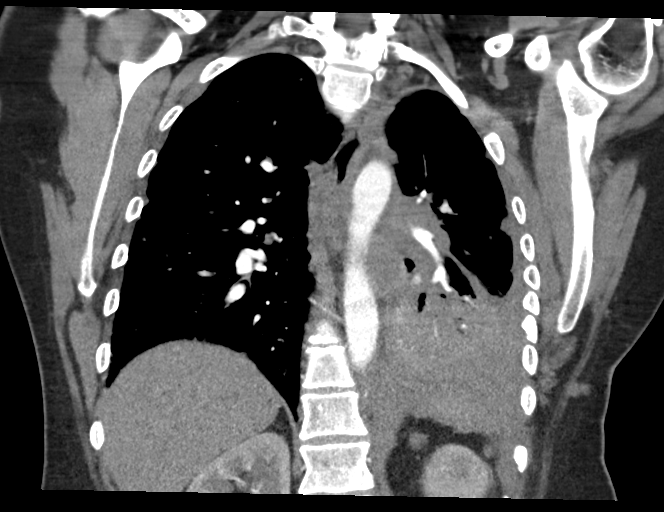

[16 of 46 positions shown; findings below may reference images not displayed]

RADIATION DOSE REDUCTION: This exam was performed according to the
departmental dose-optimization program which includes automated
exposure control, adjustment of the mA and/or kV according to
patient size and/or use of iterative reconstruction technique.

CONTRAST:  100mL OMNIPAQUE IOHEXOL 350 MG/ML SOLN
FINDINGS: Cardiovascular: Cardiomegaly with left ventricular prominence. No
visible aortic atherosclerotic calcification. Pulmonary arterial
opacification is good. There are no pulmonary emboli.

Mediastinum/Nodes: Over the last month, there has been considerable
growth and mediastinal tumor. Anterior mediastinal disease
previously measured at 15 and 17 mm on axial image 32 now measures
17 and 20 mm. Increased tumor growth in the subcarinal region and
left hilum and along the left pericardial margin.

Lungs/Pleura: Marked worsening of pulmonary metastatic disease.
Innumerable nodules scattered throughout the lungs measuring 9 mm in
size and smaller. There is worsening collapse of the left lower
lobe. Lobular pleural disease on the left has worsened.

Upper Abdomen: Worsening of hepatic metastatic disease. More
numerous and larger metastatic lesions. For example, metastasis at
the dome of the liver on the right previously measured 18 mm in
diameter and now measures 21 mm in diameter.

Musculoskeletal: Lytic sclerotic metastatic disease throughout the
spine and ribs appears similar.

Review of the MIP images confirms the above findings.
IMPRESSION: No pulmonary emboli.

Progression of metastatic tumor within the mediastinum, left hilum,
manifest as nodular metastatic lesions throughout both lungs, and
within the liver. Worsened volume loss in the left lower lobe with
progression of pleural disease.

This interpretation is rendered on an emergent basis in order to
rule out pulmonary emboli. The exam will be reviewed and addended by
body/oncologic specialist radiologist tomorrow.

ADDENDUM:
As stated in the original dictation, left-sided pleural metastasis,
widespread pulmonary metastases and hepatic metastases all appear
increased compared to prior examinations. In addition, there is
consolidative airspace disease in the left lower lobe, concerning
for pneumonia.

*** End of Addendum ***
RADIATION DOSE REDUCTION: This exam was performed according to the
departmental dose-optimization program which includes automated
exposure control, adjustment of the mA and/or kV according to
patient size and/or use of iterative reconstruction technique.

CONTRAST:  100mL OMNIPAQUE IOHEXOL 350 MG/ML SOLN
FINDINGS: Cardiovascular: Cardiomegaly with left ventricular prominence. No
visible aortic atherosclerotic calcification. Pulmonary arterial
opacification is good. There are no pulmonary emboli.

Mediastinum/Nodes: Over the last month, there has been considerable
growth and mediastinal tumor. Anterior mediastinal disease
previously measured at 15 and 17 mm on axial image 32 now measures
17 and 20 mm. Increased tumor growth in the subcarinal region and
left hilum and along the left pericardial margin.

Lungs/Pleura: Marked worsening of pulmonary metastatic disease.
Innumerable nodules scattered throughout the lungs measuring 9 mm in
size and smaller. There is worsening collapse of the left lower
lobe. Lobular pleural disease on the left has worsened.

Upper Abdomen: Worsening of hepatic metastatic disease. More
numerous and larger metastatic lesions. For example, metastasis at
the dome of the liver on the right previously measured 18 mm in
diameter and now measures 21 mm in diameter.

Musculoskeletal: Lytic sclerotic metastatic disease throughout the
spine and ribs appears similar.

Review of the MIP images confirms the above findings.
IMPRESSION: No pulmonary emboli.

Progression of metastatic tumor within the mediastinum, left hilum,
manifest as nodular metastatic lesions throughout both lungs, and
within the liver. Worsened volume loss in the left lower lobe with
progression of pleural disease.

This interpretation is rendered on an emergent basis in order to
rule out pulmonary emboli. The exam will be reviewed and addended by
body/oncologic specialist radiologist tomorrow.

## 2021-02-23 IMAGING — DX DG CHEST 1V PORT
1 series · 1 of 1 positions shown · non-contrast
Comparison: [DATE] and prior radiograph

CLINICAL DATA: Chest pain and shortness of breath. History of
metastatic breast cancer.

EXAM:
PORTABLE CHEST 1 VIEW

[chest ap]
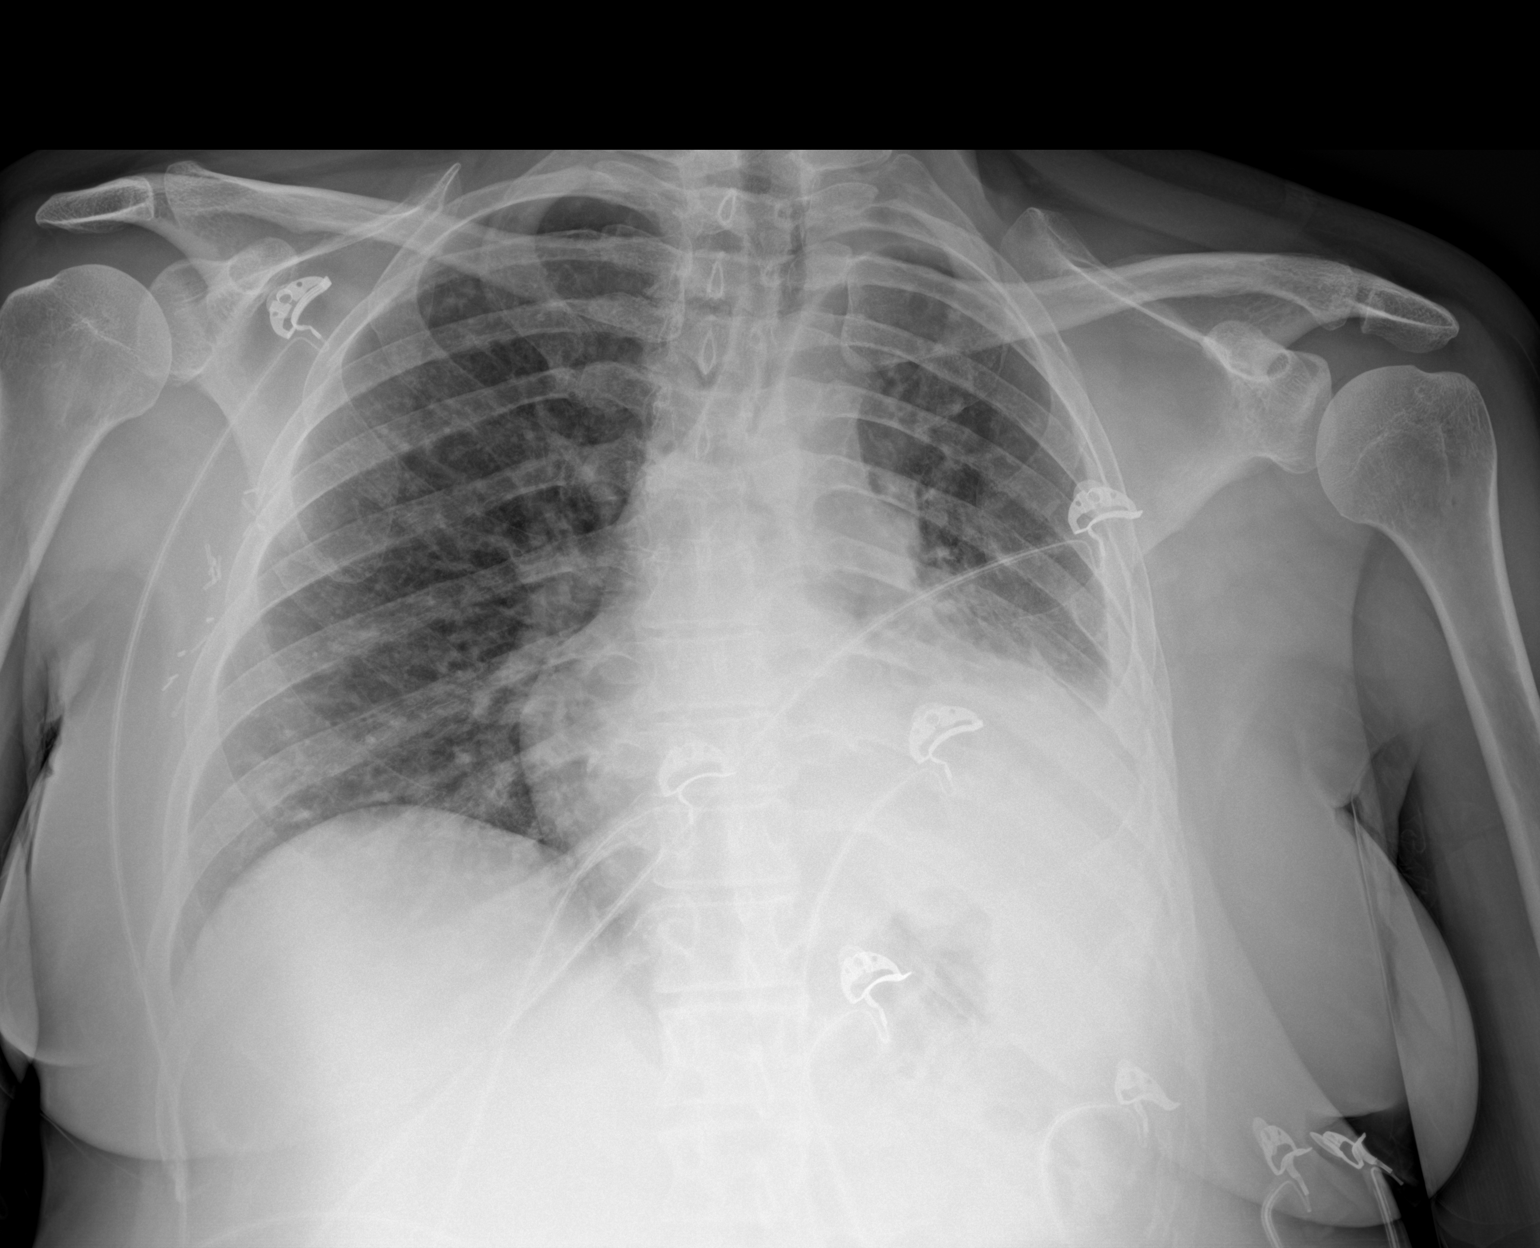

[1 of 1 positions shown; findings below may reference images not displayed]

FINDINGS: The cardiomediastinal silhouette is unchanged.

LEFT LOWER lung consolidation/atelectasis and LEFT pleural
thickening again noted.

No new pulmonary findings are noted.

There is no evidence of pneumothorax or acute bony abnormality.
IMPRESSION: Unchanged appearance of the chest with LEFT LOWER lung
consolidation/atelectasis and LEFT pleural thickening.

## 2021-02-23 MED ORDER — VANCOMYCIN HCL IN DEXTROSE 1-5 GM/200ML-% IV SOLN: 1000.0000 mg | Freq: Once | INTRAVENOUS | Status: DC

## 2021-02-23 MED ORDER — GUANFACINE HCL ER 1 MG PO TB24
2.0000 mg | ORAL_TABLET | Freq: Every day | ORAL | Status: DC
  Administered 2021-02-25 – 2021-02-26 (×2): 2 mg via ORAL
  Filled 2021-02-23 (×5): qty 2

## 2021-02-23 MED ORDER — ACETAMINOPHEN 650 MG RE SUPP: 650.0000 mg | Freq: Four times a day (QID) | RECTAL | Status: DC | PRN

## 2021-02-23 MED ORDER — SODIUM CHLORIDE 0.9 % IV SOLN
2.0000 g | Freq: Once | INTRAVENOUS | Status: AC
  Administered 2021-02-23: 2 g via INTRAVENOUS
  Filled 2021-02-23: qty 2

## 2021-02-23 MED ORDER — ONDANSETRON HCL 4 MG PO TABS: 8.0000 mg | ORAL_TABLET | Freq: Three times a day (TID) | ORAL | Status: DC | PRN

## 2021-02-23 MED ORDER — ALPRAZOLAM 0.25 MG PO TABS
0.2500 mg | ORAL_TABLET | Freq: Two times a day (BID) | ORAL | Status: DC | PRN
  Administered 2021-02-24 (×2): 0.25 mg via ORAL
  Filled 2021-02-23 (×2): qty 1

## 2021-02-23 MED ORDER — ONDANSETRON HCL 4 MG/2ML IJ SOLN
4.0000 mg | Freq: Four times a day (QID) | INTRAMUSCULAR | Status: DC | PRN
  Administered 2021-02-25 – 2021-02-26 (×2): 4 mg via INTRAVENOUS
  Filled 2021-02-23 (×2): qty 2

## 2021-02-23 MED ORDER — ENOXAPARIN SODIUM 40 MG/0.4ML IJ SOSY
40.0000 mg | PREFILLED_SYRINGE | INTRAMUSCULAR | Status: DC
  Administered 2021-02-24: 40 mg via SUBCUTANEOUS
  Filled 2021-02-23: qty 0.4

## 2021-02-23 MED ORDER — MORPHINE SULFATE (PF) 4 MG/ML IV SOLN
4.0000 mg | Freq: Once | INTRAVENOUS | Status: AC
  Administered 2021-02-23: 4 mg via INTRAVENOUS
  Filled 2021-02-23: qty 1

## 2021-02-23 MED ORDER — ACETAMINOPHEN 325 MG PO TABS
ORAL_TABLET | ORAL | Status: AC
  Administered 2021-02-23: 650 mg via ORAL
  Filled 2021-02-23: qty 2

## 2021-02-23 MED ORDER — ACETAMINOPHEN 325 MG PO TABS
650.0000 mg | ORAL_TABLET | Freq: Four times a day (QID) | ORAL | Status: DC | PRN
  Administered 2021-02-24 – 2021-02-25 (×2): 650 mg via ORAL
  Filled 2021-02-23 (×2): qty 2

## 2021-02-23 MED ORDER — ONDANSETRON HCL 4 MG/2ML IJ SOLN
4.0000 mg | Freq: Once | INTRAMUSCULAR | Status: AC
  Administered 2021-02-23: 4 mg via INTRAVENOUS
  Filled 2021-02-23: qty 2

## 2021-02-23 MED ORDER — ALBUTEROL SULFATE (2.5 MG/3ML) 0.083% IN NEBU
2.5000 mg | INHALATION_SOLUTION | Freq: Once | RESPIRATORY_TRACT | Status: AC
  Administered 2021-02-23: 2.5 mg via RESPIRATORY_TRACT
  Filled 2021-02-23: qty 3

## 2021-02-23 MED ORDER — MORPHINE SULFATE (PF) 2 MG/ML IV SOLN
2.0000 mg | INTRAVENOUS | Status: DC | PRN
  Administered 2021-02-23: 2 mg via INTRAVENOUS
  Filled 2021-02-23: qty 1

## 2021-02-23 MED ORDER — VANCOMYCIN HCL 1750 MG/350ML IV SOLN: 1750.0000 mg | INTRAVENOUS | Status: DC

## 2021-02-23 MED ORDER — ONDANSETRON HCL 4 MG PO TABS
4.0000 mg | ORAL_TABLET | Freq: Four times a day (QID) | ORAL | Status: DC | PRN
  Administered 2021-02-25: 4 mg via ORAL
  Filled 2021-02-23: qty 1

## 2021-02-23 MED ORDER — ALBUTEROL SULFATE (2.5 MG/3ML) 0.083% IN NEBU: 2.5000 mg | INHALATION_SOLUTION | Freq: Four times a day (QID) | RESPIRATORY_TRACT | Status: DC | PRN

## 2021-02-23 MED ORDER — DOCUSATE SODIUM 100 MG PO CAPS
100.0000 mg | ORAL_CAPSULE | Freq: Two times a day (BID) | ORAL | Status: DC | PRN
  Administered 2021-02-24 – 2021-02-25 (×2): 100 mg via ORAL
  Filled 2021-02-23 (×2): qty 1

## 2021-02-23 MED ORDER — OXYCODONE HCL 5 MG PO TABS
5.0000 mg | ORAL_TABLET | Freq: Four times a day (QID) | ORAL | Status: DC | PRN
  Administered 2021-02-24 (×2): 5 mg via ORAL
  Filled 2021-02-23 (×3): qty 1

## 2021-02-23 MED ORDER — SODIUM CHLORIDE 0.9 % IV BOLUS
1000.0000 mL | Freq: Once | INTRAVENOUS | Status: AC
  Administered 2021-02-23: 1000 mL via INTRAVENOUS

## 2021-02-23 MED ORDER — POLYETHYLENE GLYCOL 3350 17 G PO PACK
17.0000 g | PACK | Freq: Every day | ORAL | Status: DC | PRN
  Administered 2021-02-24 – 2021-02-25 (×2): 17 g via ORAL
  Filled 2021-02-23 (×2): qty 1

## 2021-02-23 MED ORDER — VANCOMYCIN HCL 1750 MG/350ML IV SOLN
1750.0000 mg | Freq: Once | INTRAVENOUS | Status: AC
Start: 2021-02-23 — End: 2021-02-24
  Administered 2021-02-23: 1750 mg via INTRAVENOUS
  Filled 2021-02-23: qty 350

## 2021-02-23 MED ORDER — IOHEXOL 350 MG/ML SOLN
100.0000 mL | Freq: Once | INTRAVENOUS | Status: AC | PRN
  Administered 2021-02-23: 100 mL via INTRAVENOUS

## 2021-02-23 MED ORDER — ATOMOXETINE HCL 10 MG PO CAPS
40.0000 mg | ORAL_CAPSULE | Freq: Every day | ORAL | Status: DC
  Administered 2021-02-25 – 2021-02-26 (×2): 40 mg via ORAL
  Filled 2021-02-23 (×3): qty 4

## 2021-02-23 MED ORDER — VENLAFAXINE HCL ER 75 MG PO CP24
150.0000 mg | ORAL_CAPSULE | Freq: Every day | ORAL | Status: DC
  Administered 2021-02-24 – 2021-02-25 (×3): 150 mg via ORAL
  Filled 2021-02-23 (×2): qty 1
  Filled 2021-02-23 (×2): qty 2

## 2021-02-23 MED ORDER — ACETAMINOPHEN 325 MG PO TABS
650.0000 mg | ORAL_TABLET | Freq: Once | ORAL | Status: AC
Start: 2021-02-23 — End: 2021-02-23

## 2021-02-23 MED ORDER — MORPHINE SULFATE ER 30 MG PO TBCR: 30.0000 mg | EXTENDED_RELEASE_TABLET | Freq: Three times a day (TID) | ORAL | Status: DC

## 2021-02-23 MED ORDER — PROMETHAZINE HCL 25 MG PO TABS
25.0000 mg | ORAL_TABLET | Freq: Four times a day (QID) | ORAL | Status: DC | PRN
  Filled 2021-02-23: qty 1

## 2021-02-23 MED ORDER — SODIUM CHLORIDE 0.9 % IV SOLN
2.0000 g | Freq: Three times a day (TID) | INTRAVENOUS | Status: DC
  Administered 2021-02-24 – 2021-02-26 (×6): 2 g via INTRAVENOUS
  Filled 2021-02-23 (×9): qty 2

## 2021-02-23 MED ORDER — LACTATED RINGERS IV SOLN: INTRAVENOUS | Status: DC

## 2021-02-23 NOTE — H&P (Addendum)
History and Physical    Patient: Kaylee Romero IOM:[id number] DOB: 04-27-1980 DOA: 02/23/2021 DOS: the patient was seen and examined on 02/23/2021 PCP: Dion Body, MD  Patient coming from: Home  Chief Complaint:  Chief Complaint  Patient presents with   Chest Pain   Shortness of Breath    HPI: Kaylee Romero is a 41 y.o. female with medical history significant of Stage IV breast cancer with widespread metastases on Xeloda and palliative radiation therapy who is s/p laparoscopic bilateral salpingo oophorectomy with IUD removal on 2/13 to eliminate need for hormonal suppression, as well as history of HTN, ADHD and anxiety, currently on Levaquin since 2/17 for possible left lower lobe pneumonia on x-ray after presenting with shortness of breath, who presents to the ED with fever of 101, tachycardia, continued shortness of breath, and lightheadedness.  She also has substernal chest pain that started the day prior, described as a pressure, worse with movement.  She denies lower extremity pain or swelling.  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  Denies pain at site of recent laparoscopic surgery.   ED course: On arrival temp 100.7, tachycardic to 127, respirations 18 with O2 sat 96 to 99% on room air.  BP 102/69 Blood work WBC 14,000 with lactic acid 2.1 Troponin 5, BNP 35 D-dimer 2.3 Urinalysis unremarkable COVID and flu negative  EKG, personally viewed and interpreted: Sinus tachycardia at 130 with no acute ST-T wave changes CTA chest negative for PE.  Progression of metastatic tumor within the mediastinum, left hilum, both lungs and within the liver as well as progression of pleural disease Dr.  Patient given a fluid bolus, started on vancomycin and cefepime, also given a fluid bolus, morphine for chest pain.  Hospitalist consulted for admission.   Data Reviewed: Relevant notes from primary care and specialist visits, past discharge summaries available  in EHR, including Care Everywhere. Prior diagnostic testing as pertinent to current admission diagnoses Updated medications and problem lists for reconciliation ED course, including vitals, labs, imaging, treatment and response to treatment Triage notes and ED providers notes Notable results as noted in HPI   Review of Systems: As mentioned in the history of present illness. All other systems reviewed and are negative. 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   Malignant neoplasm of upper-outer quadrant of right breast in female, estrogen receptor positive [facility])    oncologist--- dr Grayland Ormond;;  first dx 09/ 2020 s/p right lumpectomy w/ node dissection and completed chemoradiation 08/ 2021;;  recurrent 07/ 2022 to lung pleura,liver, and thoracic spine   SOB (shortness of breath)    02-12-2021  pt denies with regular activity but sob with stairs   Past Surgical History:  Procedure Laterality Date   ABDOMINAL HYSTERECTOMY N/A 02/17/2021   BREAST LUMPECTOMY WITH AXILLARY LYMPH NODE BIOPSY Right 11/21/2018   Procedure: RIGHT BREAST LUMPECTOMY WITH SENTINEL LYMPH NODE BIOPSY;  Surgeon: Jovita Kussmaul, MD;  Location: [place];  Service: General;  Laterality: Right;   BREAST REDUCTION SURGERY Bilateral 11/21/2018   Procedure: BILATERAL MAMMARY REDUCTION  (  BREAST);  Surgeon: Wallace Going, DO;  Location: [place];  Service: Plastics;  Laterality: Bilateral;   CHEST TUBE INSERTION Left 07/29/2020   Procedure: INSERTION PLEURAL DRAINAGE CATHETER;  Surgeon: Lajuana Matte, MD;  Location: [place];  Service: Thoracic;  Laterality: Left;    IRRIGATION AND DEBRIDEMENT STERNOCLAVICULAR JOINT-STERNUM AND RIBS N/A 08/13/2020   Procedure: IRRIGATION AND DEBRIDEMENT CHEST WALL ABSCESS;  Surgeon: Lajuana Matte, MD;  Location: [place] [facility];  Service: Cardiothoracic;  Laterality: N/A;   IUD REMOVAL N/A 02/17/2021   Procedure: INTRAUTERINE DEVICE (IUD) REMOVAL;  Surgeon: Linda Hedges, DO;  Location: [place];  Service: Gynecology;  Laterality: N/A;   LAPAROSCOPIC SALPINGO OOPHERECTOMY Bilateral 02/17/2021   Procedure: LAPAROSCOPIC BILATERAL SALPINGO OOPHORECTOMY;  Surgeon: Linda Hedges, DO;  Location: [place];  Service: Gynecology;  Laterality: Bilateral;   PLEURAL BIOPSY Left 07/29/2020   Procedure: PLEURAL BIOPSY;  Surgeon: Lajuana Matte, MD;  Location: [place];  Service: Thoracic;  Laterality: Left;   PLEURAL EFFUSION DRAINAGE Left 07/29/2020   Procedure: DRAINAGE OF PLEURAL EFFUSION;  Surgeon: Lajuana Matte, MD;  Location: [place];  Service: Thoracic;  Laterality: Left;   PORT-A-CATH REMOVAL N/A 07/06/2019   Procedure: REMOVAL PORT-A-CATH;  Surgeon: Jovita Kussmaul, MD;  Location: [place];  Service: General;  Laterality: N/A;   PORTACATH PLACEMENT N/A 11/21/2018   Procedure: INSERTION LEFT PORT-A-CATH WITH ULTRASOUND GUIDANCE;  Surgeon: Jovita Kussmaul, MD;  Location: [place];  Service: General;  Laterality: N/A;   [place] Left 07/29/2020   Procedure: VIDEO ASSISTED THORACOSCOPY;  Surgeon: Lajuana Matte, MD;  Location: [place];  Service: Thoracic;  Laterality: Left;   WISDOM TOOTH EXTRACTION     Social History:  reports that she quit smoking about 4 years ago. Her smoking use included cigarettes. She started smoking about 19 years ago. She has a 10.00 pack-year smoking history. She has never used smokeless tobacco. She reports current alcohol use. She reports that she does not use drugs.  Allergies  Allergen Reactions    Betadine [Povidone-Iodine] Rash    Family History  Problem Relation Age of Onset   Hypertension Father    Alcohol abuse Paternal Grandfather    Heart disease Paternal Grandfather    Alcohol abuse Maternal Grandfather    Heart disease Maternal Grandmother    Breast cancer Other     Prior to Admission medications   Medication Sig Start Date End Date Taking? Authorizing Provider  acetaminophen (TYLENOL) 500 MG tablet Take 1,000 mg by mouth every 6 (six) hours as needed.    [provider]  albuterol (VENTOLIN HFA) 108 (90 Base) MCG/ACT inhaler INHALE 2 PUFFS INTO THE LUNGS EVERY 6 HOURS AS NEEDED FOR WHEEZING OR SHORTNESS OF BREATH 02/05/21   Lloyd Huger, MD  ALPRAZolam Duanne Moron) 0.25 MG tablet Take 1 tablet (0.25 mg total) by mouth 2 (two) times daily as needed for anxiety. 01/17/21   Lloyd Huger, MD  Ascorbic Acid (VITAMIN C) 500 MG CAPS Take 500 mg by mouth daily. Patient not taking: Reported on 02/12/2021    [provider]  atomoxetine (STRATTERA) 40 MG capsule Take 40 mg by mouth daily.    [provider]  Calcium Citrate-Vitamin D (CALCIUM + D PO) Take 1 tablet by mouth 3 (three) times daily.    [provider]  capecitabine (XELODA) 500 MG tablet Take 4 tablets (2,000 mg total) by mouth  2 (two) times daily after a meal. Take for 14 days, then hold for 7 days. Repeat every 21 days. 01/30/21   Lloyd Huger, MD  chlorpheniramine-HYDROcodone (TUSSIONEX) 10-8 MG/5ML SUER Take 5 mLs by mouth every 12 (twelve) hours as needed for cough. Patient not taking: Reported on 02/12/2021 12/20/20   Borders, Kirt Boys, NP  Cholecalciferol (VITAMIN D3) 50 MCG (2000 UT) TABS Take 1 tablet by mouth daily at 12 noon.    [provider]  docusate sodium (COLACE) 100 MG capsule Take 100 mg by mouth 2 (two) times daily as needed for mild constipation.    [provider]  guanFACINE (INTUNIV) 2 MG TB24 ER tablet Take 2 mg by mouth daily. 01/13/21    [provider]  ibuprofen (ADVIL) 200 MG tablet Take 400 mg by mouth every 6 (six) hours as needed for mild pain.    [provider]  levofloxacin (LEVAQUIN) 500 MG tablet Take 1 tablet (500 mg total) by mouth daily for 7 days. 02/21/21 02/28/21  Lloyd Huger, MD  morphine (MS CONTIN) 15 MG 12 hr tablet Take 2 tablets (30 mg total) by mouth every 8 (eight) hours. 02/17/21   Lloyd Huger, MD  Multiple Vitamin (MULTIVITAMIN WITH MINERALS) TABS tablet Take 1 tablet by mouth daily. Patient not taking: Reported on 02/12/2021    [provider]  ondansetron (ZOFRAN) 8 MG tablet Take 1 tablet (8 mg total) by mouth every 8 (eight) hours as needed for nausea or vomiting. 01/30/21   Darl Pikes, RPH-CPP  oxyCODONE (OXY IR/ROXICODONE) 5 MG immediate release tablet Take 1 tablet (5 mg total) by mouth every 6 (six) hours as needed for severe pain. 02/06/21   Lloyd Huger, MD  polyethylene glycol (MIRALAX / GLYCOLAX) 17 g packet Take 17 g by mouth daily. Patient taking differently: Take 17 g by mouth daily as needed. 08/15/20   Ezekiel Slocumb, DO  promethazine (PHENERGAN) 25 MG tablet Take 1 tablet (25 mg total) by mouth every 6 (six) hours as needed for nausea or vomiting. 02/20/21   Lloyd Huger, MD  Tetrahydrozoline HCl (VISINE OP) Place 1 drop into both eyes daily as needed (redness).    [provider]  venlafaxine XR (EFFEXOR-XR) 150 MG 24 hr capsule TAKE 1 CAPSULE(150 MG) BY MOUTH DAILY WITH BREAKFAST Patient taking differently: 150 mg at bedtime. 01/07/21   Lloyd Huger, MD  vitamin B-12 (CYANOCOBALAMIN) 1000 MCG tablet Take 1,000 mcg by mouth daily. Patient not taking: Reported on 02/12/2021    [provider]    Physical Exam: Vitals:   02/23/21 2018 02/23/21 2019 02/23/21 2020 02/23/21 2100  BP:    102/69  Pulse:  (!) 127  (!) 115  Resp:  18  19  Temp:  (!) 100.7 F (38.2 C)    TempSrc:  Oral    SpO2: 96% 95%  99%   Weight:   76.9 kg   Height:   5\' 3"  (1.6 m)    Physical Exam Vitals and nursing note reviewed.  Constitutional:      General: She is not in acute distress.    Appearance: Normal appearance.  HENT:     Head: Normocephalic and atraumatic.  Cardiovascular:     Rate and Rhythm: Regular rhythm. Tachycardia present.     Pulses: Normal pulses.     Heart sounds: Normal heart sounds. No murmur heard. Pulmonary:     Effort: Pulmonary effort is normal.  Breath sounds: Normal breath sounds. No wheezing or rhonchi.  Chest:     Comments: Pain on deep palpation over mid sternum Abdominal:     General: Bowel sounds are normal.     Palpations: Abdomen is soft.     Tenderness: There is no abdominal tenderness.  Musculoskeletal:        General: No swelling or tenderness. Normal range of motion.     Cervical back: Normal range of motion and neck supple.  Skin:    General: Skin is warm and dry.  Neurological:     General: No focal deficit present.     Mental Status: She is alert. Mental status is at baseline.  Psychiatric:        Mood and Affect: Mood normal.        Behavior: Behavior normal.      Assessment and Plan: * Pneumonia- (present on admission) Worsening symptoms in spite of outpatient Levaquin x2 days Continue vancomycin and cefepime from ED, in view of immunosuppression from chemotherapy Supplemental O2 as needed to keep sats over 94% DuoNebs as needed   Sepsis ([place])- (present on admission) Criteria: Fever, tachycardia, leukocytosis and lactic acidosis.  CXR from 2/17 with concern for LLL PNA Continue fluid bolus Treat pneumonia as outlined below    Chest pain Atypical, suspect pleuritic related to mediastinal tumor/ bony mets pleural mets EKG nonacute Continue home MS Contin and oxycodone.  Trial of Toradol for symptomatic relief with Dilaudid for severe breakthrough  Acute dyspnea CTA chest negative for PE Likely all related to pneumonia.  Chest x-ray showed  LEFT LOWER lung consolidation/atelectasis and LEFT pleural thickening. Treat pneumonia as outlined above    Cancer-related pain Continue MS Contin, oxycodone IR  S/P bilateral salpingo-oophorectomy 02/17/21 No acute issues suspected CTA chest negative for PE  GAD (generalized anxiety disorder)- (present on admission) Continue venlafaxine and alprazolam  Malignant neoplasm of upper-outer quadrant of right breast in female, estrogen receptor positive (HCC) On Xeloda and palliative radiation therapy  ADD (attention deficit disorder)- (present on admission) Continue Strattera and Intuniv       Advance Care Planning:   Code Status: Prior full  Consults: none  Family Communication: none  Severity of Illness: The appropriate patient status for this patient is INPATIENT. Inpatient status is judged to be reasonable and necessary in order to provide the required intensity of service to ensure the patient's safety. The patient's presenting symptoms, physical exam findings, and initial radiographic and laboratory data in the context of their chronic comorbidities is felt to place them at high risk for further clinical deterioration. Furthermore, it is not anticipated that the patient will be medically stable for discharge from the hospital within 2 midnights of admission.   * I certify that at the point of admission it is my clinical judgment that the patient will require inpatient hospital care spanning beyond 2 midnights from the point of admission due to high intensity of service, high risk for further deterioration and high frequency of surveillance required.*  Author: Athena Masse, MD 02/23/2021 10:38 PM  For on call review [email or web address].

## 2021-02-23 NOTE — Assessment & Plan Note (Addendum)
No acute issues suspected. This was recently performed on 02/17/21 so will need to monitor for any post op infection as well.

## 2021-02-23 NOTE — Assessment & Plan Note (Addendum)
On Xeloda and palliative radiation therapy Follows with Dr. Grayland Ormond. Also notified Dr. Donella Stade from radiation oncology who will postpone her radiation for later in the week unless if she will be in the hospital for a few days then he will perform this inpatient

## 2021-02-23 NOTE — Assessment & Plan Note (Signed)
Continue venlafaxine and alprazolam

## 2021-02-23 NOTE — Assessment & Plan Note (Addendum)
Continue Strattera and Intuniv

## 2021-02-23 NOTE — ED Provider Notes (Signed)
[facility] Provider Note    Event Date/Time   First MD Initiated Contact with Patient 02/23/21 2013     (approximate)   History   Chest Pain and Shortness of Breath   HPI  Kaylee Romero is a 41 y.o. female with a history of stage IV breast cancer with lung metastases and status post recent bilateral salpingo oophorectomy on 2/13 who presents with chest pain since yesterday, gradual onset, associated with shortness of breath, lightheadedness, as well as fever to 101 at home and an elevated heart rate.  Per the patient chest x-ray few days ago was concerning for infection and she was started on Levaquin.  She denies any associated leg swelling.      Physical Exam   Triage Vital Signs: ED Triage Vitals  Enc Vitals Group     BP --      Pulse Rate 02/23/21 2019 (!) 127     Resp 02/23/21 2019 18     Temp 02/23/21 2019 (!) 100.7 F (38.2 C)     Temp Source 02/23/21 2019 Oral     SpO2 02/23/21 2018 96 %     Weight 02/23/21 2020 169 lb 8.5 oz (76.9 kg)     Height 02/23/21 2020 5\' 3"  (1.6 m)     Head Circumference --      Peak Flow --      Pain Score 02/23/21 2019 8     Pain Loc --      Pain Edu? --      Excl. in [place]? --     Most recent vital signs: Vitals:   02/23/21 2019 02/23/21 2100  BP:  102/69  Pulse: (!) 127 (!) 115  Resp: 18 19  Temp: (!) 100.7 F (38.2 C)   SpO2: 95% 99%     General: Alert and oriented, somewhat uncomfortable appearing but in no acute distress. CV:  Good peripheral perfusion.  Tachycardic, normal heart sounds. Resp:  Slightly increased effort.  Lungs CTAB. Abd:  No distention.  Other:  No peripheral edema.  No calf or popliteal swelling or tenderness.   ED Results / Procedures / Treatments   Labs (all labs ordered are listed, but only abnormal results are displayed) Labs Reviewed  COMPREHENSIVE METABOLIC PANEL - Abnormal; Notable for the following components:      Result Value   Sodium 133 (*)    Chloride  94 (*)    Glucose, Bld 136 (*)    All other components within normal limits  CBC WITH DIFFERENTIAL/PLATELET - Abnormal; Notable for the following components:   WBC 14.4 (*)    Platelets 401 (*)    Neutro Abs 12.6 (*)    Abs Immature Granulocytes 0.08 (*)    All other components within normal limits  D-DIMER, QUANTITATIVE - Abnormal; Notable for the following components:   D-Dimer, Quant 2.30 (*)    All other components within normal limits  LACTIC ACID, PLASMA - Abnormal; Notable for the following components:   Lactic Acid, Venous 2.1 (*)    All other components within normal limits  URINALYSIS, ROUTINE W REFLEX MICROSCOPIC - Abnormal; Notable for the following components:   Color, Urine YELLOW (*)    APPearance CLEAR (*)    All other components within normal limits  RESP PANEL BY RT-PCR (FLU A&B, COVID) ARPGX2  CULTURE, BLOOD (ROUTINE X 2)  CULTURE, BLOOD (ROUTINE X 2)  MRSA NEXT GEN BY PCR, NASAL  BRAIN NATRIURETIC PEPTIDE  PREGNANCY,  URINE  LACTIC ACID, PLASMA  TROPONIN I (HIGH SENSITIVITY)  TROPONIN I (HIGH SENSITIVITY)     EKG  ED ECG REPORT I, Arta Silence, the attending physician, personally viewed and interpreted this ECG.  Date: 02/23/2021 EKG Time: 2017 Rate: 130 Rhythm: Sinus tachycardia QRS Axis: normal Intervals: normal ST/T Wave abnormalities: nonspecific T wave abnormalities Narrative Interpretation: Sinus tachycardia with no evidence of acute ischemia    RADIOLOGY  Chest x-ray: I independently viewed and interpreted the images; there is a left lower lung pleural effusion with no other focal consolidation or edema.  It is unchanged from x-ray on 2/17.  CT angio chest: I independently viewed and interpreted the images; there is no evidence of PE.  PROCEDURES:  Critical Care performed: Yes, see critical care procedure note(s)  .Critical Care Performed by: Arta Silence, MD Authorized by: Arta Silence, MD   Critical care  provider statement:    Critical care time (minutes):  30   Critical care was necessary to treat or prevent imminent or life-threatening deterioration of the following conditions:  Cardiac failure, circulatory failure and sepsis   Critical care was time spent personally by me on the following activities:  Development of treatment plan with patient or surrogate, discussions with consultants, evaluation of patient's response to treatment, examination of patient, ordering and review of laboratory studies, ordering and review of radiographic studies, ordering and performing treatments and interventions, pulse oximetry, re-evaluation of patient's condition and review of old charts   Care discussed with: admitting provider     [place] ED: Medications  ceFEPIme (MAXIPIME) 2 g in sodium chloride 0.9 % 100 mL IVPB (2 g Intravenous New Bag/Given 02/23/21 2213)  vancomycin (VANCOREADY) IVPB 1750 mg/350 mL (has no administration in time range)  morphine (PF) 4 MG/ML injection 4 mg (4 mg Intravenous Given 02/23/21 2034)  ondansetron (ZOFRAN) injection 4 mg (4 mg Intravenous Given 02/23/21 2032)  acetaminophen (TYLENOL) tablet 650 mg (650 mg Oral Given 02/23/21 2031)  sodium chloride 0.9 % bolus 1,000 mL (1,000 mLs Intravenous New Bag/Given 02/23/21 2033)  iohexol (OMNIPAQUE) 350 MG/ML injection 100 mL (100 mLs Intravenous Contrast Given 02/23/21 2118)  morphine (PF) 4 MG/ML injection 4 mg (4 mg Intravenous Given 02/23/21 2156)  albuterol (PROVENTIL) (2.5 MG/3ML) 0.083% nebulizer solution 2.5 mg (2.5 mg Nebulization Given 02/23/21 2211)     IMPRESSION / MDM / [place] / ED COURSE  I reviewed the triage vital signs and the nursing notes.  41 year old female with PMH as noted above including stage IV breast cancer with lung metastasis and s/p recent bilateral salpingo-oophorectomy on 2/13 presents with chest pain since yesterday associated with shortness of breath, fever, and  tachycardia.  I reviewed the past medical records; I confirmed the history of bilateral salpingo-oophorectomy performed on 2/13.  The patient contacted the oncology office on 2/17 and had a chest x-ray which shows a left pleural effusion.  Consolidation cannot be ruled out, the patient was started on Levaquin.  On exam the patient has a low-grade fever and is tachycardic to the 120s.  O2 saturation is in the mid 90s on room air.  Physical exam is otherwise as described above.  Differential diagnosis includes, but is not limited to, malignant effusion, pneumonia, COVID-19, influenza, other viral bronchitis, pulmonary embolism.  We will obtain a chest x-ray, lab work-up, give fluids and analgesia, and reassess.  The patient will likely need a CT angio chest rule out PE.  The patient is on the cardiac  monitor to evaluate for evidence of arrhythmia and/or significant heart rate changes.  ----------------------------------------- 10:22 PM on 02/23/2021 -----------------------------------------  Chest x-ray shows left lower lobe effusion with no change from the outpatient x-ray.  CT angio is negative for PE but confirms effusion, worsening metastatic disease, and left lung collapse.  Lab work-up is significant for leukocytosis and a slightly elevated lactate.  Respiratory panel is negative.  Chemistry is unremarkable.  Troponin is negative.  Overall presentation is consistent with underlying left lower lobe pneumonia versus bronchitis.  Fluids have been administered.  I ordered broad-spectrum antibiotics to cover for possible hospital-acquired pneumonia given the patient's recent admission for surgery.  I recommended that we admit the patient for further IV antibiotics and fluids, and the patient agrees.  I consulted Dr. Damita Dunnings from the hospitalist service; based on our discussion she agrees to admit the patient.    FINAL CLINICAL IMPRESSION(S) / ED DIAGNOSES   Final diagnoses:  None      Rx / DC Orders   ED Discharge Orders     None        Note:  This document was prepared using Dragon voice recognition software and may include unintentional dictation errors.    Arta Silence, MD 02/23/21 2223

## 2021-02-23 NOTE — Assessment & Plan Note (Addendum)
Continue MS Contin, oxycodone IR.  Additionally IV Dilaudid, Toradol has been added Bowel regimen

## 2021-02-23 NOTE — Assessment & Plan Note (Addendum)
CTA chest negative for PE Likely all related to pneumonia.   Chest x-ray showed left lower lung consolidation/atelectasis and LEFT pleural thickening.  There is also progression of metastatic tumor within the mediastinum, left hilum and liver. Initiated on vancomycin and cefepime.  MRSA screen negative therefore will DC vancomycin BNP-35 COVID/flu-negative UA-negative MRSA-negative

## 2021-02-23 NOTE — ED Triage Notes (Signed)
Pt to ED via [facility] for central CP and SOB that started yesterday. Pt states she also has light headedness. Denies abdominal pain.  Pt had hysterectomy on 02/17/21, denies any pain from incision site.   Pt states she has temp of 101 at home, took  aleve around 4 pm.  Pt has hx of breast cancer- last radiation tx 02/21/21; pleural effusion on L side- has constant pain; takes oxycodone and morphine for pain management.   Pt is A&Ox4

## 2021-02-23 NOTE — Assessment & Plan Note (Addendum)
Fever appears to have subsided but still has borderline low blood pressure and mild tachycardia.  Lactic acidosis resolved.  WBC is improving this is secondary to left lower lobe pneumonia Fever could also be related to tumor burden Continued on IV antibiotics for 3 days.  Now transition to oral Levaquin.  She feels better. Sepsis physiology resolved.

## 2021-02-23 NOTE — Progress Notes (Signed)
PHARMACY -  BRIEF ANTIBIOTIC NOTE   Pharmacy has received consult(s) for vancomycin and cefepime from an ED provider.  The patient's profile has been reviewed for ht/wt/allergies/indication/available labs.    One time order(s) placed for: Cefepime 2 g IV Vancomycin 1750 mg IV  Further antibiotics/pharmacy consults should be ordered by admitting physician if indicated.                       Thank you, Forde Dandy Britanee Vanblarcom 02/23/2021  10:05 PM

## 2021-02-23 NOTE — Progress Notes (Signed)
Pharmacy Antibiotic Note  Kaylee Romero is a 41 y.o. female admitted on 02/23/2021 with pneumonia.  Pharmacy has been consulted for vancomycin, Cefepime  dosing.  Plan: Cefepime 2 gm IV X 1 given in ED on 2/19 @ 2213. Cefepime 2 gm IV Q8H ordered to continue on 2/20 @ 0600.  Vancomycin 1750 mg IV X 1 ordered for 2/20 @ ~ 0000. Vancomycin 1750 mg IV Q24H ordered to continue on 2/21 @ 0000.  AUC = 469.7 Vanc trough = 9.   Height: 5\' 3"  (160 cm) Weight: 76.9 kg (169 lb 8.5 oz) IBW/kg (Calculated) : 52.4  Temp (24hrs), Avg:100.7 F (38.2 C), Min:100.7 F (38.2 C), Max:100.7 F (38.2 C)  Recent Labs  Lab 02/23/21 2019 02/23/21 2221  WBC 14.4*  --   CREATININE 0.69  --   LATICACIDVEN 2.1* 1.0    Estimated Creatinine Clearance: 90 mL/min (by C-G formula based on SCr of 0.69 mg/dL).    Allergies  Allergen Reactions   Betadine [Povidone-Iodine] Rash    Antimicrobials this admission:   >>    >>   Dose adjustments this admission:   Microbiology results:  BCx:   UCx:    Sputum:    MRSA PCR:   Thank you for allowing pharmacy to be a part of this patients care.  Tove Wideman D 02/23/2021 11:05 PM

## 2021-02-23 NOTE — ED Notes (Signed)
Pt reports sudden increase in chest pressure and feeling of numbness in bilateral fingers. EKG captured and this RN spoke with Damita Dunnings, MD on the phone.

## 2021-02-23 NOTE — Assessment & Plan Note (Addendum)
She presented with worsening symptoms in spite of outpatient Levaquin for 2 days.  Here she was switched to cefepime. Bronchodilators scheduled as needed.

## 2021-02-24 ENCOUNTER — Ambulatory Visit: Payer: BC Managed Care – PPO

## 2021-02-24 ENCOUNTER — Inpatient Hospital Stay: Payer: BC Managed Care – PPO

## 2021-02-24 ENCOUNTER — Encounter: Payer: Self-pay | Admitting: Emergency Medicine

## 2021-02-24 DIAGNOSIS — F988 Other specified behavioral and emotional disorders with onset usually occurring in childhood and adolescence: Secondary | ICD-10-CM | POA: Diagnosis not present

## 2021-02-24 DIAGNOSIS — R079 Chest pain, unspecified: Secondary | ICD-10-CM

## 2021-02-24 DIAGNOSIS — A419 Sepsis, unspecified organism: Principal | ICD-10-CM

## 2021-02-24 DIAGNOSIS — R0789 Other chest pain: Secondary | ICD-10-CM

## 2021-02-24 DIAGNOSIS — R0600 Dyspnea, unspecified: Secondary | ICD-10-CM | POA: Diagnosis not present

## 2021-02-24 DIAGNOSIS — J189 Pneumonia, unspecified organism: Secondary | ICD-10-CM | POA: Diagnosis not present

## 2021-02-24 DIAGNOSIS — Z90722 Acquired absence of ovaries, bilateral: Secondary | ICD-10-CM

## 2021-02-24 DIAGNOSIS — G893 Neoplasm related pain (acute) (chronic): Secondary | ICD-10-CM | POA: Diagnosis not present

## 2021-02-24 DIAGNOSIS — F411 Generalized anxiety disorder: Secondary | ICD-10-CM

## 2021-02-24 DIAGNOSIS — Z170 Estrogen receptor positive status [ER+]: Secondary | ICD-10-CM | POA: Diagnosis not present

## 2021-02-24 DIAGNOSIS — C50411 Malignant neoplasm of upper-outer quadrant of right female breast: Secondary | ICD-10-CM

## 2021-02-24 LAB — CBC
HCT: 36.3 % (ref 36.0–46.0)
Hemoglobin: 11.9 g/dL — ABNORMAL LOW (ref 12.0–15.0)
MCH: 33 pg (ref 26.0–34.0)
MCHC: 32.8 g/dL (ref 30.0–36.0)
MCV: 100.6 fL — ABNORMAL HIGH (ref 80.0–100.0)
Platelets: 313 10*3/uL (ref 150–400)
RBC: 3.61 MIL/uL — ABNORMAL LOW (ref 3.87–5.11)
RDW: 12.8 % (ref 11.5–15.5)
WBC: 12.4 10*3/uL — ABNORMAL HIGH (ref 4.0–10.5)
nRBC: 0 % (ref 0.0–0.2)

## 2021-02-24 LAB — TROPONIN I (HIGH SENSITIVITY)
Troponin I (High Sensitivity): 108 ng/L (ref <18)
Troponin I (High Sensitivity): 764 ng/L (ref <18)
Troponin I (High Sensitivity): 1824 ng/L (ref <18)

## 2021-02-24 LAB — APTT: aPTT: 24 seconds (ref 24–36)

## 2021-02-24 IMAGING — DX DG CHEST 1V PORT
1 series · 1 of 1 positions shown · non-contrast
Comparison: Previous studies including the examination of
[DATE]

CLINICAL DATA: Shortness of breath, chest pain

EXAM:
PORTABLE CHEST 1 VIEW

[chest ap]
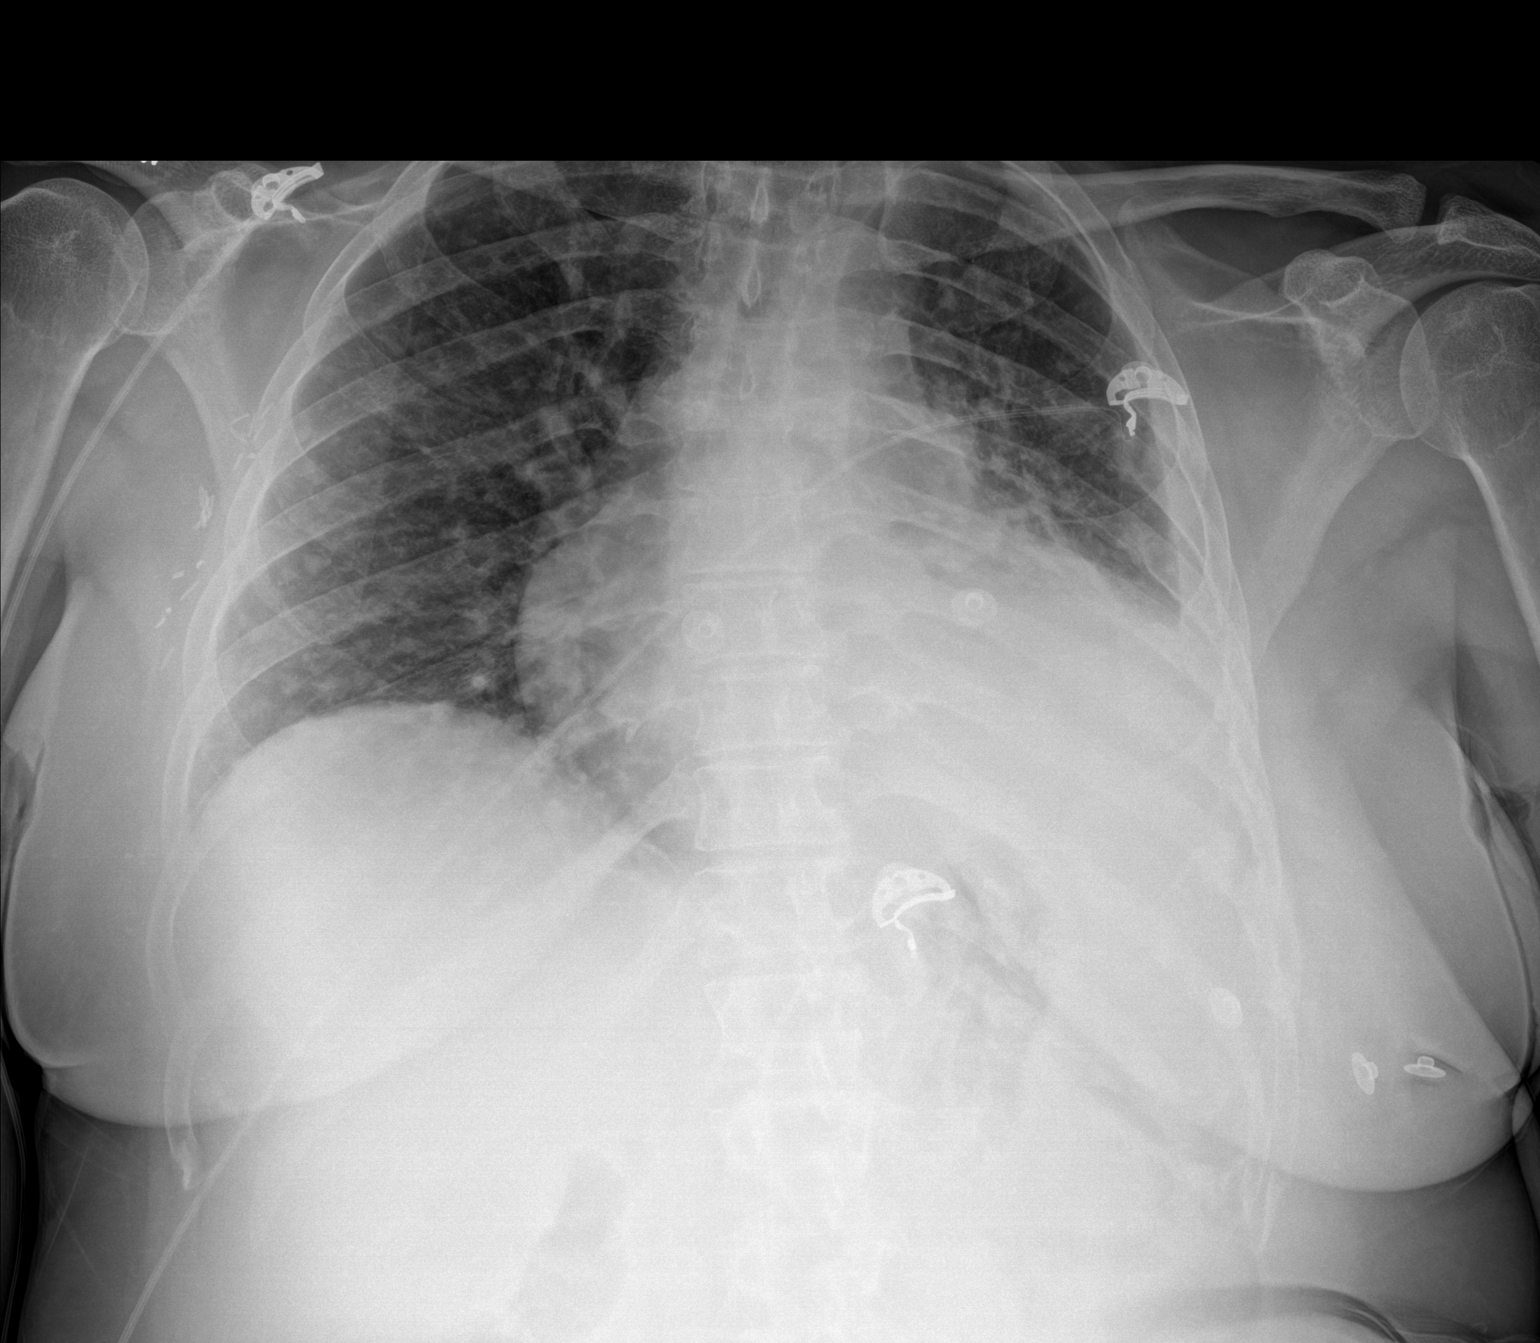

[1 of 1 positions shown; findings below may reference images not displayed]

FINDINGS: Transverse diameter of heart is increased. There is opacification of
left lower lung fields consistent with pleural effusion and possibly
underlying infiltrate with no significant change. Are possible small
nodular densities in the right mid and right lower lung fields.
Right lateral CP angle is clear. There is no pneumothorax.
IMPRESSION: No significant interval changes are noted in the opacification of
left lower lung fields suggesting pleural effusion and possibly
underlying infiltrate.

## 2021-02-24 MED ORDER — OXYCODONE HCL 5 MG PO TABS
10.0000 mg | ORAL_TABLET | ORAL | Status: DC | PRN
  Administered 2021-02-24 – 2021-02-26 (×4): 10 mg via ORAL
  Filled 2021-02-24 (×4): qty 2

## 2021-02-24 MED ORDER — METOPROLOL TARTRATE 5 MG/5ML IV SOLN
5.0000 mg | INTRAVENOUS | Status: DC | PRN
  Administered 2021-02-25: 5 mg via INTRAVENOUS
  Filled 2021-02-24: qty 5

## 2021-02-24 MED ORDER — ASPIRIN 325 MG PO TABS
325.0000 mg | ORAL_TABLET | Freq: Once | ORAL | Status: AC
  Administered 2021-02-24: 325 mg via ORAL
  Filled 2021-02-24: qty 1

## 2021-02-24 MED ORDER — IPRATROPIUM-ALBUTEROL 0.5-2.5 (3) MG/3ML IN SOLN
3.0000 mL | Freq: Three times a day (TID) | RESPIRATORY_TRACT | Status: DC
  Administered 2021-02-25 – 2021-02-26 (×4): 3 mL via RESPIRATORY_TRACT
  Filled 2021-02-24 (×5): qty 3

## 2021-02-24 MED ORDER — HYDROMORPHONE HCL 1 MG/ML IJ SOLN
1.0000 mg | INTRAMUSCULAR | Status: DC | PRN
  Administered 2021-02-24 – 2021-02-26 (×18): 1 mg via INTRAVENOUS
  Filled 2021-02-24 (×19): qty 1

## 2021-02-24 MED ORDER — ALPRAZOLAM 0.5 MG PO TABS
0.5000 mg | ORAL_TABLET | Freq: Three times a day (TID) | ORAL | Status: DC | PRN
  Administered 2021-02-24: 0.5 mg via ORAL
  Filled 2021-02-24: qty 1

## 2021-02-24 MED ORDER — KETOROLAC TROMETHAMINE 30 MG/ML IJ SOLN
30.0000 mg | Freq: Three times a day (TID) | INTRAMUSCULAR | Status: DC | PRN
  Administered 2021-02-25 – 2021-02-26 (×3): 30 mg via INTRAVENOUS
  Filled 2021-02-24 (×3): qty 1

## 2021-02-24 MED ORDER — HYDRALAZINE HCL 20 MG/ML IJ SOLN
10.0000 mg | INTRAMUSCULAR | Status: DC | PRN
Start: 2021-02-24 — End: 2021-02-26

## 2021-02-24 MED ORDER — KETOROLAC TROMETHAMINE 30 MG/ML IJ SOLN
30.0000 mg | Freq: Once | INTRAMUSCULAR | Status: AC
  Administered 2021-02-24: 30 mg via INTRAVENOUS
  Filled 2021-02-24: qty 1

## 2021-02-24 MED ORDER — ASPIRIN EC 81 MG PO TBEC
81.0000 mg | DELAYED_RELEASE_TABLET | Freq: Every day | ORAL | Status: DC
  Administered 2021-02-25: 09:00:00 81 mg via ORAL
  Filled 2021-02-24: qty 1

## 2021-02-24 MED ORDER — IPRATROPIUM-ALBUTEROL 0.5-2.5 (3) MG/3ML IN SOLN
3.0000 mL | Freq: Three times a day (TID) | RESPIRATORY_TRACT | Status: DC
  Administered 2021-02-24 (×2): 3 mL via RESPIRATORY_TRACT
  Filled 2021-02-24 (×2): qty 3

## 2021-02-24 MED ORDER — HEPARIN BOLUS VIA INFUSION
4000.0000 [IU] | Freq: Once | INTRAVENOUS | Status: AC
  Administered 2021-02-24: 22:00:00 4000 [IU] via INTRAVENOUS
  Filled 2021-02-24: qty 4000

## 2021-02-24 MED ORDER — METOPROLOL TARTRATE 25 MG PO TABS
25.0000 mg | ORAL_TABLET | Freq: Two times a day (BID) | ORAL | Status: DC
  Administered 2021-02-24 – 2021-02-26 (×4): 25 mg via ORAL
  Filled 2021-02-24 (×4): qty 1

## 2021-02-24 MED ORDER — LIDOCAINE 5 % EX PTCH
2.0000 | MEDICATED_PATCH | CUTANEOUS | Status: DC
  Administered 2021-02-24: 16:00:00 2 via TRANSDERMAL
  Administered 2021-02-25: 1 via TRANSDERMAL
  Filled 2021-02-24 (×3): qty 2

## 2021-02-24 MED ORDER — LACTATED RINGERS IV BOLUS
500.0000 mL | Freq: Once | INTRAVENOUS | Status: AC
  Administered 2021-02-24: 500 mL via INTRAVENOUS

## 2021-02-24 MED ORDER — MORPHINE SULFATE ER 15 MG PO TBCR
30.0000 mg | EXTENDED_RELEASE_TABLET | Freq: Three times a day (TID) | ORAL | Status: DC
  Administered 2021-02-24 – 2021-02-26 (×7): 30 mg via ORAL
  Filled 2021-02-24: qty 2
  Filled 2021-02-24: qty 1
  Filled 2021-02-24 (×3): qty 2
  Filled 2021-02-24 (×2): qty 1

## 2021-02-24 MED ORDER — KETOROLAC TROMETHAMINE 60 MG/2ML IM SOLN
60.0000 mg | Freq: Once | INTRAMUSCULAR | Status: AC
  Administered 2021-02-24: 60 mg via INTRAMUSCULAR
  Filled 2021-02-24: qty 2

## 2021-02-24 MED ORDER — LACTATED RINGERS IV SOLN: INTRAVENOUS | Status: AC

## 2021-02-24 MED ORDER — SODIUM CHLORIDE 0.9 % IV BOLUS
500.0000 mL | Freq: Once | INTRAVENOUS | Status: AC
  Administered 2021-02-24: 500 mL via INTRAVENOUS

## 2021-02-24 MED ORDER — NITROGLYCERIN 2 % TD OINT
1.0000 [in_us] | TOPICAL_OINTMENT | Freq: Four times a day (QID) | TRANSDERMAL | Status: DC
  Administered 2021-02-24 – 2021-02-25 (×4): 1 [in_us] via TOPICAL
  Filled 2021-02-24 (×4): qty 1

## 2021-02-24 MED ORDER — IPRATROPIUM-ALBUTEROL 0.5-2.5 (3) MG/3ML IN SOLN
3.0000 mL | RESPIRATORY_TRACT | Status: DC | PRN
Start: 2021-02-24 — End: 2021-02-26

## 2021-02-24 MED ORDER — HEPARIN (PORCINE) 25000 UT/250ML-% IV SOLN
1050.0000 [IU]/h | INTRAVENOUS | Status: DC
  Administered 2021-02-24: 22:00:00 800 [IU]/h via INTRAVENOUS
  Filled 2021-02-24: qty 250

## 2021-02-24 MED ORDER — OXYCODONE HCL 5 MG PO TABS: 5.0000 mg | ORAL_TABLET | ORAL | Status: DC | PRN

## 2021-02-24 MED ORDER — TRAZODONE HCL 50 MG PO TABS
50.0000 mg | ORAL_TABLET | Freq: Every evening | ORAL | Status: DC | PRN
Start: 2021-02-24 — End: 2021-02-26
  Administered 2021-02-24 – 2021-02-25 (×2): 50 mg via ORAL
  Filled 2021-02-24 (×2): qty 1

## 2021-02-24 MED ORDER — NITROGLYCERIN 0.4 MG SL SUBL: 0.4000 mg | SUBLINGUAL_TABLET | SUBLINGUAL | Status: DC | PRN

## 2021-02-24 MED ORDER — GUAIFENESIN 100 MG/5ML PO LIQD: 5.0000 mL | ORAL | Status: DC | PRN

## 2021-02-24 NOTE — Progress Notes (Addendum)
2nd set of trop trending up to 764, repeat EKG shows Sinus tachycardia. She still very mild substernal chest discomfort.   Ordered: ASA 325mg , Nitro Ointment 1inch, Nitro SL prn, Heparin Drip, Metoprolol 25mg  po BID, IV lopressor prn. Echo for tomorrow morning. Continue Trending trops for now atleast until it peaks.  Check TSH, Lipid Panel and A1c.   Spoke with Dr Clayborn Bigness from Cardiology, agree with the plan.   Gerlean Ren MD.

## 2021-02-24 NOTE — Progress Notes (Addendum)
Patient is still having off and on quite of mid substernal point tenderness alone with occasional pleuritic chest pain.  I suspect this is due to her PNA, Malignancy and lytic lesions of her bone.  Repeat CXR still shows some left sided effusion, no acute changes.  Will order a repeat EKG and Trops I have ordered lidocaine patch for chest wall pain, adjust her pain medications.  She also mentioned nebs helped her in the morning, there will add Duonebs TID and cont the prn treatments.    Addendum 410pm EKG looks over all unremarkable but trop elevated at 103. Will consult cardiology, Dr Clayborn Bigness, for their input.   Gerlean Ren MD

## 2021-02-24 NOTE — TOC Initial Note (Signed)
Transition of Care [facility]) - Initial/Assessment Note    Patient Details  Name: Kaylee Romero MRN: [id number] Date of Birth: [birth year]  Transition of Care [facility]) CM/SW Contact:    Pete Pelt, RN Phone Number: 02/24/2021, 3:50 PM  Clinical Narrative:     Transition of Care [facility]) Screening Note   Patient Details  Name: Kaylee Romero Date of Birth: [birth year]   Transition of Care [facility]) CM/SW Contact:    Pete Pelt, RN Phone Number: 02/24/2021, 3:50 PM  Patient is current with MD appts, has PCP and mother and husband assist with care.  Transition of Care Department [facility]) has reviewed patient and no TOC needs have been identified at this time. We will continue to monitor patient advancement through interdisciplinary progression rounds. If new patient transition needs arise, please place a TOC consult.                       Patient Goals and CMS Choice        Expected Discharge Plan and Services                                                Prior Living Arrangements/Services                       Activities of Daily Living Home Assistive Devices/Equipment: None ADL Screening (condition at time of admission) Patient's cognitive ability adequate to safely complete daily activities?: Yes Is the patient deaf or have difficulty hearing?: No Does the patient have difficulty seeing, even when wearing glasses/contacts?: No Does the patient have difficulty concentrating, remembering, or making decisions?: No Patient able to express need for assistance with ADLs?: Yes Does the patient have difficulty dressing or bathing?: No Independently performs ADLs?: Yes (appropriate for developmental age) Does the patient have difficulty walking or climbing stairs?: No Weakness of Legs: None Weakness of Arms/Hands: None  Permission Sought/Granted                  Emotional Assessment              Admission diagnosis:  Pneumonia  [J18.9] Pleural effusion [J90] Healthcare-associated pneumonia [J18.9] Patient Active Problem List   Diagnosis Date Noted   Chest pain 02/24/2021   Pneumonia 02/23/2021   S/P bilateral salpingo-oophorectomy 02/17/21 02/23/2021   Cancer-related pain 02/23/2021   Acute dyspnea 02/23/2021   External hemorrhoids 11/11/2020   Recurrent breast cancer, right ([place]) 11/11/2020   Visit for wound check 09/10/2020   Chest wall abscess 08/13/2020   Sepsis ([place]) 08/09/2020   Pleural effusion on left 07/29/2020   Pulmonary nodule seen on imaging study 07/01/2020   GAD (generalized anxiety disorder) 04/01/2020   Essential hypertension 03/06/2020   History of breast cancer 03/06/2020   Hx of breast surgery 02/28/2019   Port-A-Cath in place 02/02/2019   Genetic testing 10/13/2018   Malignant neoplasm of upper-outer quadrant of right breast in female, estrogen receptor positive ([place]) 09/29/2018   Pregnancy 06/18/2017   Low back pain 01/14/2013   ADD (attention deficit disorder) 03/24/2012   Overweight (BMI 25.0-29.9) 01/14/2012   PCP:  Dion Body, MD Pharmacy:   [id number] [place], [place]. [place] [address] Phone: [phone] Fax: [place] [phone] -  [place], [place] [address] Phone: [phone] Fax: [phone]  [id number] [place], [place] [address] Phone: [phone] Fax: [place] [address] Phone: [phone] Fax: [phone]  [facility] [place], [place] [address] [place] [address] Phone: [phone] Fax: [phone]     Social Determinants of Health (SDOH) Interventions    Readmission Risk Interventions No  flowsheet data found.

## 2021-02-24 NOTE — Assessment & Plan Note (Addendum)
Continue to closely monitor.  Improved this morning

## 2021-02-24 NOTE — Consult Note (Signed)
[place]  [phone] [phone]  ID: Kaylee Romero OB: 09/07/1980  MR#: [id number]  VEH#:[id number]  Patient Care Team: Dion Body, MD as PCP - General (Family Medicine) Gery Pray, MD as Consulting Physician (Radiation Oncology) Jovita Kussmaul, MD as Consulting Physician (General Surgery) Dillingham, Loel Lofty, DO as Attending Physician (Plastic Surgery) Lloyd Huger, MD as Consulting Physician (Oncology)  CHIEF COMPLAINT: Progressive stage IV breast cancer, community acquired pneumonia.  INTERVAL HISTORY: Patient is a 41 year old female who recently was noted to have progressive stage IV breast cancer who also underwent total hysterectomy 1 week ago.  She noticed increased shortness of breath this past Friday and was called in a prescription for Levaquin, but when her symptoms worsen over the weekend she presented to the emergency room and is now admitted for possible sepsis.  She feels improved since arriving to the emergency room.  She has no neurologic complaints.  She has not had a fever recently.  She has no chest pain, cough, or hemoptysis.  She had nausea and poor appetite associated with the chemo, but these have improved over the past week.  She has no constipation or diarrhea.  She has no urinary complaints.  Patient offers no further specific complaints today.  REVIEW OF SYSTEMS:   Review of Systems  Constitutional:  Positive for malaise/fatigue and weight loss. Negative for fever.  Respiratory:  Positive for shortness of breath. Negative for cough and hemoptysis.   Cardiovascular: Negative.  Negative for chest pain and leg swelling.  Gastrointestinal:  Positive for nausea. Negative for abdominal pain.  Genitourinary:  Positive for flank pain. Negative for dysuria.  Musculoskeletal:  Negative for back pain.  Skin: Negative.  Negative for rash.  Neurological:  Positive for weakness. Negative for dizziness, focal  weakness and headaches.  Psychiatric/Behavioral: Negative.  The patient is not nervous/anxious.    As per HPI. Otherwise, a complete review of systems is negative.  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   Malignant neoplasm of upper-outer quadrant of right breast in female, estrogen receptor positive [facility])    oncologist--- dr Grayland Ormond;;  first dx 09/ 2020 s/p right lumpectomy w/ node dissection and completed chemoradiation 08/ 2021;;  recurrent 07/ 2022 to lung pleura,liver, and thoracic spine   SOB (shortness of breath)    02-12-2021  pt denies with regular activity but sob with stairs    PAST SURGICAL HISTORY: Past Surgical History:  Procedure Laterality Date   ABDOMINAL HYSTERECTOMY N/A 02/17/2021   BREAST LUMPECTOMY WITH AXILLARY LYMPH NODE BIOPSY Right 11/21/2018   Procedure: RIGHT BREAST LUMPECTOMY WITH SENTINEL LYMPH NODE BIOPSY;  Surgeon: Jovita Kussmaul, MD;  Location: [place];  Service: General;  Laterality: Right;   BREAST REDUCTION SURGERY Bilateral 11/21/2018   Procedure: BILATERAL MAMMARY REDUCTION  (BREAST);  Surgeon: Wallace Going, DO;  Location: [place];  Service: Plastics;  Laterality: Bilateral;   CHEST TUBE INSERTION Left 07/29/2020   Procedure: INSERTION PLEURAL DRAINAGE CATHETER;  Surgeon: Lajuana Matte, MD;  Location: [place];  Service:  Thoracic;  Laterality: Left;   IRRIGATION AND DEBRIDEMENT STERNOCLAVICULAR JOINT-STERNUM AND RIBS N/A 08/13/2020   Procedure: IRRIGATION AND DEBRIDEMENT CHEST WALL ABSCESS;  Surgeon: Lajuana Matte, MD;  Location: [place] [facility];  Service:  Cardiothoracic;  Laterality: N/A;   IUD REMOVAL N/A 02/17/2021   Procedure: INTRAUTERINE DEVICE (IUD) REMOVAL;  Surgeon: Linda Hedges, DO;  Location: [place];  Service: Gynecology;  Laterality: N/A;   LAPAROSCOPIC SALPINGO OOPHERECTOMY Bilateral 02/17/2021   Procedure: LAPAROSCOPIC BILATERAL SALPINGO OOPHORECTOMY;  Surgeon: Linda Hedges, DO;  Location: [place];  Service: Gynecology;  Laterality: Bilateral;   PLEURAL BIOPSY Left 07/29/2020   Procedure: PLEURAL BIOPSY;  Surgeon: Lajuana Matte, MD;  Location: [place];  Service: Thoracic;  Laterality: Left;   PLEURAL EFFUSION DRAINAGE Left 07/29/2020   Procedure: DRAINAGE OF PLEURAL EFFUSION;  Surgeon: Lajuana Matte, MD;  Location: [place];  Service: Thoracic;  Laterality: Left;   PORT-A-CATH REMOVAL N/A 07/06/2019   Procedure: REMOVAL PORT-A-CATH;  Surgeon: Jovita Kussmaul, MD;  Location: [place];  Service: General;  Laterality: N/A;   PORTACATH PLACEMENT N/A 11/21/2018   Procedure: INSERTION LEFT PORT-A-CATH WITH ULTRASOUND GUIDANCE;  Surgeon: Jovita Kussmaul, MD;  Location: [place];  Service: General;  Laterality: N/A;   TONSILLECTOMY  1987   VIDEO ASSISTED THORACOSCOPY Left 07/29/2020   Procedure: VIDEO ASSISTED THORACOSCOPY;  Surgeon: Lajuana Matte, MD;  Location: [facility];  Service: Thoracic;  Laterality: Left;   WISDOM TOOTH EXTRACTION      FAMILY HISTORY: Family History  Problem Relation Age of Onset   Hypertension Father    Alcohol abuse Paternal Grandfather    Heart disease Paternal Grandfather    Alcohol abuse Maternal Grandfather    Heart disease Maternal Grandmother    Breast cancer Other     ADVANCED DIRECTIVES (Y/N):  @ADVDIR @  HEALTH MAINTENANCE: Social History   Tobacco Use   Smoking status: Former    Packs/day: 0.50    Years: 20.00    Pack years: 10.00    Types: Cigarettes    Start date: 01/13/2002    Quit date: 10/17/2016    Years since  quitting: 4.3   Smokeless tobacco: Never  Vaping Use   Vaping Use: Never used  Substance Use Topics   Alcohol use: Yes    Comment: Socially   Drug use: Never     Colonoscopy:  PAP:  Bone density:  Lipid panel:  Allergies  Allergen Reactions   Betadine [Povidone-Iodine] Rash    Current Facility-Administered Medications  Medication Dose Route Frequency Provider Last Rate Last Admin   acetaminophen (TYLENOL) tablet 650 mg  650 mg Oral Q6H PRN Athena Masse, MD   650 mg at 02/24/21 1275   Or   acetaminophen (TYLENOL) suppository 650 mg  650 mg Rectal Q6H PRN Athena Masse, MD       ALPRAZolam Duanne Moron) tablet 0.5 mg  0.5 mg Oral TID PRN Damita Lack, MD       atomoxetine (STRATTERA) capsule 40 mg  40 mg Oral Daily Judd Gaudier V, MD       ceFEPIme (MAXIPIME) 2 g in sodium chloride 0.9 % 100 mL IVPB  2 g Intravenous Q8H Judd Gaudier V, MD 200 mL/hr at 02/24/21 1420 2 g at 02/24/21 1420   docusate sodium (COLACE) capsule 100 mg  100 mg Oral BID PRN Athena Masse, MD   100 mg at 02/24/21 0004   enoxaparin (LOVENOX) injection 40  mg  40 mg Subcutaneous Q24H Judd Gaudier V, MD   40 mg at 02/24/21 0002   guaiFENesin (ROBITUSSIN) 100 MG/5ML liquid 5 mL  5 mL Oral Q4H PRN Amin, Jeanella Flattery, MD       guanFACINE (INTUNIV) ER tablet 2 mg  2 mg Oral Daily Athena Masse, MD       hydrALAZINE (APRESOLINE) injection 10 mg  10 mg Intravenous Q4H PRN Amin, Ankit Chirag, MD       HYDROmorphone (DILAUDID) injection 1 mg  1 mg Intravenous Q3H PRN Athena Masse, MD   1 mg at 02/24/21 1307   ipratropium-albuterol (DUONEB) 0.5-2.5 (3) MG/3ML nebulizer solution 3 mL  3 mL Nebulization Q4H PRN Amin, Ankit Chirag, MD       ipratropium-albuterol (DUONEB) 0.5-2.5 (3) MG/3ML nebulizer solution 3 mL  3 mL Nebulization TID Amin, Ankit Chirag, MD   3 mL at 02/24/21 1536   ketorolac (TORADOL) 30 MG/ML injection 30 mg  30 mg Intravenous Q8H PRN Amin, Ankit Chirag, MD       lactated ringers infusion    Intravenous Continuous Amin, Ankit Chirag, MD 150 mL/hr at 02/24/21 1156 Restarted at 02/24/21 1156   lidocaine (LIDODERM) 5 % 2 patch  2 patch Transdermal Q24H Amin, Ankit Chirag, MD       metoprolol tartrate (LOPRESSOR) injection 5 mg  5 mg Intravenous Q4H PRN Amin, Ankit Chirag, MD       morphine (MS CONTIN) 12 hr tablet 30 mg  30 mg Oral Q8H Amin, Ankit Chirag, MD   30 mg at 02/24/21 1420   ondansetron (ZOFRAN) tablet 4 mg  4 mg Oral Q6H PRN Athena Masse, MD       Or   ondansetron [facility]) injection 4 mg  4 mg Intravenous Q6H PRN Athena Masse, MD       oxyCODONE (Oxy IR/ROXICODONE) immediate release tablet 10 mg  10 mg Oral Q4H PRN Amin, Ankit Chirag, MD       oxyCODONE (Oxy IR/ROXICODONE) immediate release tablet 5 mg  5 mg Oral Q4H PRN Amin, Ankit Chirag, MD       polyethylene glycol (MIRALAX / GLYCOLAX) packet 17 g  17 g Oral Daily PRN Athena Masse, MD   17 g at 02/24/21 0005   promethazine (PHENERGAN) tablet 25 mg  25 mg Oral Q6H PRN Athena Masse, MD       traZODone (DESYREL) tablet 50 mg  50 mg Oral QHS PRN Amin, Ankit Chirag, MD       venlafaxine XR (EFFEXOR-XR) 24 hr capsule 150 mg  150 mg Oral QHS Judd Gaudier V, MD   150 mg at 02/24/21 0121    OBJECTIVE: Vitals:   02/24/21 1201 02/24/21 1333  BP: 101/74 127/85  Pulse: (!) 107 (!) 110  Resp: 15   Temp: 98.2 F (36.8 C) 99.1 F (37.3 C)  SpO2: 99% 99%     Body mass index is 30.03 kg/m.    ECOG FS:1 - Symptomatic but completely ambulatory  General: Well-developed, well-nourished, no acute distress. Eyes: Pink conjunctiva, anicteric sclera. HEENT: Normocephalic, moist mucous membranes. Lungs: No audible wheezing or coughing. Heart: Regular rate and rhythm. Abdomen: Soft, nontender, no obvious distention. Musculoskeletal: No edema, cyanosis, or clubbing. Neuro: Alert, answering all questions appropriately. Cranial nerves grossly intact. Skin: No rashes or petechiae noted. Psych: Normal affect. Lymphatics: No  cervical, calvicular, axillary or inguinal LAD.   LAB RESULTS:  Lab Results  Component Value Date  NA 133 (L) 02/23/2021   K 3.8 02/23/2021   CL 94 (L) 02/23/2021   CO2 28 02/23/2021   GLUCOSE 136 (H) 02/23/2021   BUN 14 02/23/2021   CREATININE 0.69 02/23/2021   CALCIUM 9.3 02/23/2021   PROT 7.7 02/23/2021   ALBUMIN 3.9 02/23/2021   AST 35 02/23/2021   ALT 18 02/23/2021   ALKPHOS 78 02/23/2021   BILITOT 0.5 02/23/2021   GFRNONAA >60 02/23/2021   GFRAA >60 08/07/2019    Lab Results  Component Value Date   WBC 12.4 (H) 02/24/2021   NEUTROABS 12.6 (H) 02/23/2021   HGB 11.9 (L) 02/24/2021   HCT 36.3 02/24/2021   MCV 100.6 (H) 02/24/2021   PLT 313 02/24/2021     STUDIES: DG Chest 2 View  Result Date: 02/21/2021 CLINICAL DATA:  A 41 year old female presents with shortness of breath and history of breast cancer. EXAM: CHEST - 2 VIEW COMPARISON:  Chest x-ray from October 01, 2020 and CT of the chest from January 27, 2021. FINDINGS: Cardiomediastinal contours and hilar structures are stable. LEFT hemidiaphragm remains obscured. Signs of LEFT pleural effusion and basilar airspace process. Subtle background nodularity has increased in the RIGHT chest over time. No additional areas of frank consolidative changes. On limited assessment there is no acute skeletal process. Evidence of prior RIGHT axillary dissection presumably projecting over the RIGHT chest. IMPRESSION: 1. Malignant effusion in the LEFT chest with further consolidation or enlarging nodules and masses in this area. Difficult to exclude developing infection in the LEFT lung base. 2. Scattered small nodular densities in the RIGHT chest appearing more conspicuous than on previous imaging may represent a combination of known metastatic disease and superimposed infection. Electronically Signed   By: Zetta Bills M.D.   On: 02/21/2021 13:22   CT Angio Chest PE W and/or Wo Contrast  Addendum Date: 02/24/2021   ADDENDUM  REPORT: 02/24/2021 07:47 ADDENDUM: As stated in the original dictation, left-sided pleural metastasis, widespread pulmonary metastases and hepatic metastases all appear increased compared to prior examinations. In addition, there is consolidative airspace disease in the left lower lobe, concerning for pneumonia. Electronically Signed   By: Vinnie Langton M.D.   On: 02/24/2021 07:47   Result Date: 02/24/2021 CLINICAL DATA:  Pulmonary embolism suspected. High probability. Metastatic breast cancer. Shortness of breath. EXAM: CT ANGIOGRAPHY CHEST WITH CONTRAST TECHNIQUE: Multidetector CT imaging of the chest was performed using the standard protocol during bolus administration of intravenous contrast. Multiplanar CT image reconstructions and MIPs were obtained to evaluate the vascular anatomy. RADIATION DOSE REDUCTION: This exam was performed according to the departmental dose-optimization program which includes automated exposure control, adjustment of the mA and/or kV according to patient size and/or use of iterative reconstruction technique. CONTRAST:  126mL OMNIPAQUE IOHEXOL 350 MG/ML SOLN COMPARISON:  Chest radiography same day.  Chest CT 01/27/2021. FINDINGS: Cardiovascular: Cardiomegaly with left ventricular prominence. No visible aortic atherosclerotic calcification. Pulmonary arterial opacification is good. There are no pulmonary emboli. Mediastinum/Nodes: Over the last month, there has been considerable growth and mediastinal tumor. Anterior mediastinal disease previously measured at 15 and 17 mm on axial image 32 now measures 17 and 20 mm. Increased tumor growth in the subcarinal region and left hilum and along the left pericardial margin. Lungs/Pleura: Marked worsening of pulmonary metastatic disease. Innumerable nodules scattered throughout the lungs measuring 9 mm in size and smaller. There is worsening collapse of the left lower lobe. Lobular pleural disease on the left has worsened. Upper Abdomen:  Worsening of  hepatic metastatic disease. More numerous and larger metastatic lesions. For example, metastasis at the dome of the liver on the right previously measured 18 mm in diameter and now measures 21 mm in diameter. Musculoskeletal: Lytic sclerotic metastatic disease throughout the spine and ribs appears similar. Review of the MIP images confirms the above findings. IMPRESSION: No pulmonary emboli. Progression of metastatic tumor within the mediastinum, left hilum, manifest as nodular metastatic lesions throughout both lungs, and within the liver. Worsened volume loss in the left lower lobe with progression of pleural disease. This interpretation is rendered on an emergent basis in order to rule out pulmonary emboli. The exam will be reviewed and addended by body/oncologic specialist radiologist tomorrow. Electronically Signed: By: Nelson Chimes M.D. On: 02/23/2021 21:42   CT CHEST ABDOMEN PELVIS W CONTRAST  Result Date: 01/27/2021 CLINICAL DATA:  A 41 year old female presents for evaluation/follow-up of breast cancer in a LEFT side and mid back pain radiating to the front. EXAM: CT CHEST, ABDOMEN, AND PELVIS WITH CONTRAST TECHNIQUE: Multidetector CT imaging of the chest, abdomen and pelvis was performed following the standard protocol during bolus administration of intravenous contrast. RADIATION DOSE REDUCTION: This exam was performed according to the departmental dose-optimization program which includes automated exposure control, adjustment of the mA and/or kV according to patient size and/or use of iterative reconstruction technique. CONTRAST:  71mL OMNIPAQUE IOHEXOL 300 MG/ML  SOLN COMPARISON:  November 12, 2020. FINDINGS: CT CHEST FINDINGS Cardiovascular: Pericardial nodularity along the LEFT heart border and thickening along the LEFT mediastinal border is similar about the LEFT heart but increased about the superior mediastinum, see below. The aorta is normal caliber. Central pulmonary vasculature  with normal caliber. Mediastinum/Nodes: Nodularity along the LEFT mediastinal border with increased, measuring 17 mm greatest thickness (image 23/2) at this same location previously 7 mm. Internal mammary lymph node/nodularity along internal mammary vessels (image 23/2) 15 mm greatest thickness, previously approximately 5-6 mm, increasingly solid since the prior study. Scattered small supraclavicular lymph nodes do not meet pathologic size criteria and are unchanged on the LEFT when compared to the previous study. LEFT retropectoral lymph nodes also similarly stable, for example a lymph node in the LEFT retropectoral region 7 mm (image 17/2) Changes of RIGHT axillary dissection and associated soft tissue distortion with similar appearance. Lungs/Pleura: Increasing pleural thickening in the LEFT chest and nodular soft tissue associated with the LEFT hemidiaphragm, for instance (image 49/2) thickening in the LEFT dependent, posterior pleural space and nodularity affecting the LEFT hemidiaphragm. New nodule along the under surface of the LEFT hemidiaphragm approximately 15 mm greatest thickness, thickening in the posterior LEFT pleural space approximately 17 mm greatest thickness previously 11 mm. Further volume loss in the inferior LEFT chest. Soft tissue in the anterior aspect of the inferior costodiaphragmatic recess (image 42/2) 17 18 mm greatest thickness with more [employer] appearance previously more fluid density in this area. Discrete nodule along the pleural surface in the dependent LEFT chest in the LEFT lower lobe (image 71/4) 15 mm. This is enlarged, previously in the 2-3 mm range in terms of greatest thickness within enlarging 6 mm nodule along the pleural surface on image 61/4 previously 2-3 mm. Parenchymal nodule in the superior segment of the LEFT lower lobe measures 4 mm previously 2 mm (image 42/4) confluent soft tissue contiguous with consolidative changes with subjective increase may  reflect worsening of disease superimposed on rounded atelectasis (image 40/2) this measures 2.6 cm greatest axial dimension Scattered small pulmonary nodules in the  RIGHT chest are similar to prior imaging. Dominant nodule in the RIGHT lower lobe at approximately 6-7 mm (image 90/4). Infiltration of the extrapleural fat abuts the aorta on image 43 of series 2 and contacts the anterior aspect of the T11 and T12 vertebral bodies. Musculoskeletal: See below for full musculoskeletal details. Skin thickening along the anterior RIGHT breast and soft tissue nodularity with in the RIGHT breast parenchyma with similar appearance to prior imaging. CT ABDOMEN PELVIS FINDINGS Hepatobiliary: New area of low attenuation in the RIGHT hepatic lobe (image 49/2) 18 mm greatest axial dimension, not present previously. Similar appearing area of low attenuation in the RIGHT hepatic lobe along the boundary of medial segment LEFT and medial RIGHT hepatic lobe (image 59/2) 21 mm. Stable area of low attenuation in the periphery of the RIGHT hepatic lobe (image 54/2) the portal vein is patent. No pericholecystic stranding. No biliary duct dilation. Pancreas: Normal, without mass, inflammation or ductal dilatation. Spleen: Normal. Adrenals/Urinary Tract: The LEFT adrenal is in close proximity to nodularity along the surface of the LEFT hemidiaphragm now more prominent on the abdominal side of the diaphragm (image 55/2) 8 mm nodule contacts the LEFT adrenal gland. RIGHT adrenal gland is normal. Kidneys with normal CT appearance, no signs of hydronephrosis. No perivesical stranding. No suspicious renal lesion. Stomach/Bowel: Small hiatal hernia. No perigastric stranding or focal gastric thickening to the extent evaluated by CT. No signs of bowel obstruction or acute bowel process normal appendix. Vascular/Lymphatic: Aorta with smooth contours. IVC with smooth contours. No aneurysmal dilation of the abdominal aorta. There is no gastrohepatic or  hepatoduodenal ligament lymphadenopathy. No retroperitoneal or mesenteric lymphadenopathy. No pelvic sidewall lymphadenopathy. Reproductive: IUD in situ.  No adnexal mass. Other: No ascites.  No free air. Musculoskeletal: Scattered sclerotic foci in the pelvis, for instance on image 82/2) this measures 6 mm and is unchanged likewise other small sclerotic foci in the pelvis are similarly without change. Scattered sclerotic foci in the spine, for instance at the L4 level on the LEFT (image 77/2) unchanged. Lytic lesion along the LEFT lateral aspect of the T10 vertebral body 10 mm previously with sclerotic border, no longer with sclerotic border, previously 8 mm. Multiple areas of fracture and rib destruction involving LEFT-sided ribs, overlapping of LEFT seventh rib is similar to the prior exam. There is further sclerosis of these areas when compared to previous imaging involvement of multiple ribs along the LEFT chest is otherwise unchanged IMPRESSION: 1. Worsening of disease in the chest with increasing pleural thickening and LEFT hemidiaphragmatic nodularity both above and below the LEFT hemidiaphragm. 2. Increasing size of parenchymal nodules in the LEFT chest abutting the pleural surface with stable appearance of RIGHT-sided pulmonary nodules. 3. Worsening of hepatic metastatic disease. 4. Nodularity along the under surface of the LEFT hemidiaphragm contacts the LEFT adrenal gland. 5. Similar appearance of destructive changes and pathologic fracture of LEFT-sided ribs in terms of lytic changes and areas of fracture. Increasing sclerosis in this area is the only appreciable change. 6. Increasing size of lytic focus in T10 and unchanged appearance of sclerotic lesions in the pelvis and spine. 7. Aortic atherosclerosis. Aortic Atherosclerosis (ICD10-I70.0). Electronically Signed   By: Zetta Bills M.D.   On: 01/27/2021 16:35   DG Chest Port 1 View  Result Date: 02/24/2021 CLINICAL DATA:  Shortness of breath,  chest pain EXAM: PORTABLE CHEST 1 VIEW COMPARISON:  Previous studies including the examination of 02/23/2021 FINDINGS: Transverse diameter of heart is increased. There is opacification of  left lower lung fields consistent with pleural effusion and possibly underlying infiltrate with no significant change. Are possible small nodular densities in the right mid and right lower lung fields. Right lateral CP angle is clear. There is no pneumothorax. IMPRESSION: No significant interval changes are noted in the opacification of left lower lung fields suggesting pleural effusion and possibly underlying infiltrate. Electronically Signed   By: Elmer Picker M.D.   On: 02/24/2021 15:06   DG Chest Portable 1 View  Result Date: 02/23/2021 CLINICAL DATA:  Chest pain and shortness of breath. History of metastatic breast cancer. EXAM: PORTABLE CHEST 1 VIEW COMPARISON:  02/21/2021 and prior radiograph FINDINGS: The cardiomediastinal silhouette is unchanged. LEFT LOWER lung consolidation/atelectasis and LEFT pleural thickening again noted. No new pulmonary findings are noted. There is no evidence of pneumothorax or acute bony abnormality. IMPRESSION: Unchanged appearance of the chest with LEFT LOWER lung consolidation/atelectasis and LEFT pleural thickening. Electronically Signed   By: Margarette Canada M.D.   On: 02/23/2021 20:42    ASSESSMENT: Progressive stage IV breast cancer, community acquired pneumonia.  PLAN:    1.  Progressive stage IV breast cancer: Patient was recently started on second line treatment with Xeloda approximately 2 weeks ago.  It is too soon to determine whether treatment is effective.  No intervention is needed at this time.  Patient has been instructed to keep her previously scheduled follow-up appointment on February 27, 2021 for further evaluation. 2.  Community-acquired pneumonia: Patient received oral Levaquin last Friday, but then when her symptoms worsened over the weekend she presented to  the emergency room.  Patient reports that she feels better since initiating IV antibiotics.  Okay from an oncology standpoint to discharge on oral antibiotics when patient is stable.  Follow-up in the cancer center as above. 3.  Prophylactic hysterectomy: Patient underwent total hysterectomy on February 17, 2021.  Follow-up with gynecology as scheduled. 4.  Weight loss: Will arrange appointment with dietary as an outpatient. 5.  Nausea: Improved.  Continue Phenergan as needed. 6.  Disposition: Okay to discharge from an oncology standpoint when patient is stable.  Follow-up this Thursday as above.   Appreciate consult, will follow.  Lloyd Huger, MD   02/24/2021 3:38 PM

## 2021-02-24 NOTE — Consult Note (Signed)
ANTICOAGULATION CONSULT NOTE  Pharmacy Consult for Heparin Indication: chest pain/ACS  Allergies  Allergen Reactions   Betadine [Povidone-Iodine] Rash    Patient Measurements: Height: 5\' 3"  (160 cm) Weight: 76.9 kg (169 lb 8.5 oz) IBW/kg (Calculated) : 52.4 Heparin Dosing Weight: 68.9 kg  Vital Signs: Temp: 98 F (36.7 C) (02/20 1556) Temp Source: Oral (02/20 1556) BP: 109/71 (02/20 1556) Pulse Rate: 110 (02/20 1556)  Labs: Recent Labs    02/23/21 2019 02/23/21 2221 02/24/21 0523 02/24/21 1504 02/24/21 1702  HGB 14.1  --  11.9*  --   --   HCT 41.5  --  36.3  --   --   PLT 401*  --  313  --   --   CREATININE 0.69  --   --   --   --   TROPONINIHS 5 5  --  108* 764*    Estimated Creatinine Clearance: 90 mL/min (by C-G formula based on SCr of 0.69 mg/dL).   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   Malignant neoplasm of upper-outer quadrant of right breast in female, estrogen receptor positive [facility])    oncologist--- dr Grayland Ormond;;  first dx 09/ 2020 s/p right lumpectomy w/ node dissection and completed chemoradiation 08/ 2021;;  recurrent 07/ 2022 to lung pleura,liver, and thoracic spine   SOB (shortness of breath)    02-12-2021  pt denies with regular activity but sob with stairs    Medications:  Medications Prior to Admission  Medication Sig Dispense Refill Last Dose   albuterol (VENTOLIN HFA) 108 (90 Base) MCG/ACT inhaler INHALE 2 PUFFS INTO THE LUNGS EVERY 6 HOURS AS NEEDED FOR WHEEZING OR SHORTNESS OF BREATH 6.7 g 2 02/23/2021   ALPRAZolam (XANAX) 0.25 MG tablet Take 1 tablet (0.25 mg total)  by mouth 2 (two) times daily as needed for anxiety. 60 tablet 0 02/23/2021   Calcium Citrate-Vitamin D (CALCIUM + D PO) Take 1 tablet by mouth 3 (three) times daily.   02/23/2021   capecitabine (XELODA) 500 MG tablet Take 4 tablets (2,000 mg total) by mouth 2 (two) times daily after a meal. Take for 14 days, then hold for 7 days. Repeat every 21 days. 112 tablet 1 Past Week   Cholecalciferol (VITAMIN D3) 50 MCG (2000 UT) TABS Take 1 tablet by mouth daily at 12 noon.   02/23/2021   levofloxacin (LEVAQUIN) 500 MG tablet Take 1 tablet (500 mg total) by mouth daily for 7 days. 7 tablet 0 02/23/2021   morphine (MS CONTIN) 15 MG 12 hr tablet Take 2 tablets (30 mg total) by mouth every 8 (eight) hours. 90 tablet 0 02/23/2021 at 1700   oxyCODONE (OXY IR/ROXICODONE) 5 MG immediate release tablet Take 1 tablet (5 mg total) by mouth every 6 (six) hours as needed for severe pain. 90 tablet 0 02/23/2021 at 1500   promethazine (PHENERGAN) 25 MG tablet Take 1 tablet (25 mg total) by mouth every 6 (six) hours as needed for nausea or vomiting. 30 tablet 2 02/23/2021   acetaminophen (TYLENOL) 500 MG tablet Take 1,000 mg  by mouth every 6 (six) hours as needed.   prn at unknown   Ascorbic Acid (VITAMIN C) 500 MG CAPS Take 500 mg by mouth daily. (Patient not taking: Reported on 02/12/2021)      atomoxetine (STRATTERA) 40 MG capsule Take 40 mg by mouth daily.   prn at unknown   chlorpheniramine-HYDROcodone (TUSSIONEX) 10-8 MG/5ML SUER Take 5 mLs by mouth every 12 (twelve) hours as needed for cough. (Patient not taking: Reported on 02/12/2021) 140 mL 0    docusate sodium (COLACE) 100 MG capsule Take 100 mg by mouth 2 (two) times daily as needed for mild constipation.   prn at unknown   guanFACINE (INTUNIV) 2 MG TB24 ER tablet Take 2 mg by mouth daily.   prn at unknown   ibuprofen (ADVIL) 200 MG tablet Take 400 mg by mouth every 6 (six) hours as needed for mild pain.   prn at unknown   Multiple Vitamin (MULTIVITAMIN WITH MINERALS) TABS  tablet Take 1 tablet by mouth daily. (Patient not taking: Reported on 02/12/2021)      ondansetron (ZOFRAN) 8 MG tablet Take 1 tablet (8 mg total) by mouth every 8 (eight) hours as needed for nausea or vomiting. 30 tablet 1 prn at unknown   palbociclib (IBRANCE) 125 MG tablet Take 125 mg by mouth daily. Take for 21 days on, 7 days off, repeat every 28 days. (Patient not taking: Reported on 02/24/2021)   Not Taking   polyethylene glycol (MIRALAX / GLYCOLAX) 17 g packet Take 17 g by mouth daily. (Patient taking differently: Take 17 g by mouth daily as needed for mild constipation.) 14 each 0 prn at unknown   Tetrahydrozoline HCl (VISINE OP) Place 1 drop into both eyes daily as needed (redness).   prn at unknown   venlafaxine XR (EFFEXOR-XR) 150 MG 24 hr capsule TAKE 1 CAPSULE(150 MG) BY MOUTH DAILY WITH BREAKFAST (Patient taking differently: 150 mg at bedtime.) 30 capsule 1 02/22/2021 at 2000   vitamin B-12 (CYANOCOBALAMIN) 1000 MCG tablet Take 1,000 mcg by mouth daily. (Patient not taking: Reported on 02/12/2021)      Scheduled:   aspirin  325 mg Oral Once   atomoxetine  40 mg Oral Daily   enoxaparin (LOVENOX) injection  40 mg Subcutaneous Q24H   guanFACINE  2 mg Oral Daily   ipratropium-albuterol  3 mL Nebulization TID   lidocaine  2 patch Transdermal Q24H   morphine  30 mg Oral Q8H   nitroGLYCERIN  1 inch Topical Q6H   venlafaxine XR  150 mg Oral QHS   Infusions:   ceFEPime (MAXIPIME) IV 2 g (02/24/21 1420)   lactated ringers 75 mL/hr at 02/24/21 1559   PRN: acetaminophen **OR** acetaminophen, ALPRAZolam, docusate sodium, guaiFENesin, hydrALAZINE, HYDROmorphone (DILAUDID) injection, ipratropium-albuterol, ketorolac, metoprolol tartrate, nitroGLYCERIN, ondansetron **OR** ondansetron (ZOFRAN) IV, oxyCODONE, oxyCODONE, polyethylene glycol, promethazine, traZODone  Assessment: Pharmacy consulted to start heparin for ACS. Trop elevated at 764. No DOAC PTA noted. Received enoxaparin 2/20 @ midnight.    Goal of Therapy:  Heparin level 0.3-0.7 units/ml Monitor platelets by anticoagulation protocol: Yes   Plan:  Give 4000 units bolus x 1 Start heparin infusion at 800 units/hr Check anti-Xa level in 6 hours and daily while on heparin Continue to monitor H&H and platelets  Oswald Hillock, PharmD, 02/24/2021,6:13 PM

## 2021-02-24 NOTE — Assessment & Plan Note (Addendum)
NSTEMI as mentioned with combination of pneumonia, metastatic bone and pleuritic pain

## 2021-02-24 NOTE — Progress Notes (Signed)
PROGRESS NOTE    Kaylee Romero  SPQ:[id number] DOB: 1980/08/11 DOA: 02/23/2021 PCP: Dion Body, MD   Brief Narrative:  41 year old female with metastatic stage IV breast cancer on Xeloda and palliative radiation underwent recent laparoscopic bilateral salpingo-oophorectomy with IUD removal on 2/13, HTN, ADHD, anxiety comes to the hospital with shortness of breath and chest discomfort.  Patient was diagnosed with left lower lobe pneumonia outpatient last week.  She was started on Levaquin which she took for 2 days but her symptoms persisted therefore came to the hospital.  Upon admission she was noted to be in sepsis secondary to left lower lobe pneumonia.  Chest pain was likely secondary to her cancer and pneumonia pain.  She was started on vancomycin and cefepime and later vancomycin was discontinued because MRSA screen was negative.  She required fluid resuscitation for hypotension.   Assessment & Plan:  Principal Problem:   Pneumonia Active Problems:   ADD (attention deficit disorder)   Malignant neoplasm of upper-outer quadrant of right breast in female, estrogen receptor positive ([place])   Sepsis ([place])   Essential hypertension   GAD (generalized anxiety disorder)   S/P bilateral salpingo-oophorectomy 02/17/21   Cancer-related pain   Acute dyspnea   Chest pain     Assessment and Plan: * Pneumonia- (present on admission) Worsening symptoms in spite of outpatient Levaquin x2 days Continue vancomycin and cefepime from ED, in view of immunosuppression from chemotherapy Supplemental O2 as needed to keep sats over 94% DuoNebs as needed   Chest pain Secondary to underlying metastatic disease.  In the chest.  EKG is nonischemic.  Currently on pain medicines    Acute dyspnea CTA chest negative for PE Likely all related to pneumonia.  Chest x-ray showed LEFT LOWER lung consolidation/atelectasis and LEFT pleural thickening.  There is also progression of metastatic tumor  within the mediastinum, left hilum and liver. IV antibiotics-vancomycin, cefepime.  MRSA screen negative therefore will DC vancomycin Procalcitonin BNP-35 COVID/flu-negative UA-negative MRSA-negative    Cancer-related pain Continue MS Contin, oxycodone IR Bowel regimen  S/P bilateral salpingo-oophorectomy 02/17/21 No acute issues suspected. This was recently perfermed on 02/17/21 so will need to monitor for any post op infection as well.  CTA chest negative for PE  GAD (generalized anxiety disorder)- (present on admission) Continue venlafaxine and alprazolam  Essential hypertension- (present on admission) Due to borderline low blood pressure, meds are on hold  Sepsis ([place])- (present on admission) Fever appears to have subsided but still has borderline low blood pressure and mild tachycardia.  Lactic acidosis resolved.  WBC is improving this is secondary to left lower lobe pneumonia Fever could also be related to tumor burden Continue IV antibiotics 500 cc fluid bolus      Malignant neoplasm of upper-outer quadrant of right breast in female, estrogen receptor positive ([facility]) On Xeloda and palliative radiation therapy Dr. Grayland Ormond notified that she is hospitalized. Also notified Dr. Donella Stade from radiation oncology who will postpone her radiation for later in the week unless if she will be in the hospital for a few days then he will perform this inpatient  ADD (attention deficit disorder)- (present on admission) Continue Strattera and Intuniv          DVT prophylaxis: enoxaparin (LOVENOX) injection 40 mg Start: 02/23/21 2300 Code Status: Full code Family Communication:    Status is: Inpatient Remains inpatient appropriate because: Patient is still febrile and hypotensive requiring aggressive IV fluids and IV antibiotic.  Maintain hospital stay at least for next 24-48  hours.         Subjective: When I saw the patient she reported of some chest discomfort mainly  in the left lower and middle of the chest.  Mostly was pleuritic in nature.  This pain has been ongoing for several weeks now.  Review of Systems Otherwise negative except as per HPI, including: General: Denies fever, chills, night sweats or unintended weight loss. Resp: Denies cough, wheezing, shortness of breath. Cardiac: Denies chest pain, palpitations, orthopnea, paroxysmal nocturnal dyspnea. GI: Denies abdominal pain, nausea, vomiting, diarrhea or constipation GU: Denies dysuria, frequency, hesitancy or incontinence MS: Denies muscle aches, joint pain or swelling Neuro: Denies headache, neurologic deficits (focal weakness, numbness, tingling), abnormal gait Psych: Denies anxiety, depression, SI/HI/AVH Skin: Denies new rashes or lesions ID: Denies sick contacts, exotic exposures, travel  Examination:  General exam: Appears calm and comfortable  Respiratory system: Diminished breath sounds left lower lobe. Cardiovascular system: Sinus tachycardia, S1 & S2 heard, RRR. No JVD, murmurs, rubs, gallops or clicks. No pedal edema. Gastrointestinal system: Abdomen is nondistended, soft and nontender. No organomegaly or masses felt. Normal bowel sounds heard. Central nervous system: Alert and oriented. No focal neurological deficits. Extremities: Symmetric 5 x 5 power. Skin: No rashes, lesions or ulcers Psychiatry: Judgement and insight appear normal. Mood & affect appropriate.     Objective: Vitals:   02/24/21 0600 02/24/21 0630 02/24/21 0700 02/24/21 0939  BP: 97/64 100/75 99/67 (!) 96/55  Pulse: (!) 109 (!) 105 (!) 104 (!) 106  Resp: (!) 25 20 (!) 25 (!) 22  Temp:      TempSrc:      SpO2: 99% 95% 96% 100%  Weight:      Height:       No intake or output data in the 24 hours ending 02/24/21 1009 Filed Weights   02/23/21 2020  Weight: 76.9 kg     Data Reviewed:   CBC: Recent Labs  Lab 02/23/21 2019 02/24/21 0523  WBC 14.4* 12.4*  NEUTROABS 12.6*  --   HGB 14.1 11.9*   HCT 41.5 36.3  MCV 97.6 100.6*  PLT 401* 884   Basic Metabolic Panel: Recent Labs  Lab 02/23/21 2019  NA 133*  K 3.8  CL 94*  CO2 28  GLUCOSE 136*  BUN 14  CREATININE 0.69  CALCIUM 9.3   GFR: Estimated Creatinine Clearance: 90 mL/min (by C-G formula based on SCr of 0.69 mg/dL). Liver Function Tests: Recent Labs  Lab 02/23/21 2019  AST 35  ALT 18  ALKPHOS 78  BILITOT 0.5  PROT 7.7  ALBUMIN 3.9   No results for input(s): LIPASE, AMYLASE in the last 168 hours. No results for input(s): AMMONIA in the last 168 hours. Coagulation Profile: No results for input(s): INR, PROTIME in the last 168 hours. Cardiac Enzymes: No results for input(s): CKTOTAL, CKMB, CKMBINDEX, TROPONINI in the last 168 hours. BNP (last 3 results) No results for input(s): PROBNP in the last 8760 hours. HbA1C: No results for input(s): HGBA1C in the last 72 hours. CBG: No results for input(s): GLUCAP in the last 168 hours. Lipid Profile: No results for input(s): CHOL, HDL, LDLCALC, TRIG, CHOLHDL, LDLDIRECT in the last 72 hours. Thyroid Function Tests: No results for input(s): TSH, T4TOTAL, FREET4, T3FREE, THYROIDAB in the last 72 hours. Anemia Panel: No results for input(s): VITAMINB12, FOLATE, FERRITIN, TIBC, IRON, RETICCTPCT in the last 72 hours. Sepsis Labs: Recent Labs  Lab 02/23/21 2019 02/23/21 2221  LATICACIDVEN 2.1* 1.0    Recent Results (  from the past 240 hour(s))  Resp Panel by RT-PCR (Flu A&B, Covid) Nasopharyngeal Swab     Status: None   Collection Time: 02/23/21  8:19 PM   Specimen: Nasopharyngeal Swab; Nasopharyngeal(NP) swabs in vial transport medium  Result Value Ref Range Status   SARS Coronavirus 2 by RT PCR NEGATIVE NEGATIVE Final    Comment: (NOTE) SARS-CoV-2 target nucleic acids are NOT DETECTED.  The SARS-CoV-2 RNA is generally detectable in upper respiratory specimens during the acute phase of infection. The lowest concentration of SARS-CoV-2 viral copies this  assay can detect is 138 copies/mL. A negative result does not preclude SARS-Cov-2 infection and should not be used as the sole basis for treatment or other patient management decisions. A negative result may occur with  improper specimen collection/handling, submission of specimen other than nasopharyngeal swab, presence of viral mutation(s) within the areas targeted by this assay, and inadequate number of viral copies(<138 copies/mL). A negative result must be combined with clinical observations, patient history, and epidemiological information. The expected result is Negative.  Fact Sheet for Patients:  EntrepreneurPulse.com.au  Fact Sheet for Healthcare Providers:  IncredibleEmployment.be  This test is no t yet approved or cleared by the [place] FDA and  has been authorized for detection and/or diagnosis of SARS-CoV-2 by FDA under an Emergency Use Authorization (EUA). This EUA will remain  in effect (meaning this test can be used) for the duration of the COVID-19 declaration under Section 564(b)(1) of the Act, 21 U.S.C.section 360bbb-3(b)(1), unless the authorization is terminated  or revoked sooner.       Influenza A by PCR NEGATIVE NEGATIVE Final   Influenza B by PCR NEGATIVE NEGATIVE Final    Comment: (NOTE) The Xpert Xpress SARS-CoV-2/FLU/RSV plus assay is intended as an aid in the diagnosis of influenza from Nasopharyngeal swab specimens and should not be used as a sole basis for treatment. Nasal washings and aspirates are unacceptable for Xpert Xpress SARS-CoV-2/FLU/RSV testing.  Fact Sheet for Patients: EntrepreneurPulse.com.au  Fact Sheet for Healthcare Providers: IncredibleEmployment.be  This test is not yet approved or cleared by the [place] FDA and has been authorized for detection and/or diagnosis of SARS-CoV-2 by FDA under an Emergency Use Authorization (EUA). This EUA will  remain in effect (meaning this test can be used) for the duration of the COVID-19 declaration under Section 564(b)(1) of the Act, 21 U.S.C. section 360bbb-3(b)(1), unless the authorization is terminated or revoked.  Performed at [facility], [address] [place], [place] [address]   Culture, blood (routine x 2)     Status: None (Preliminary result)   Collection Time: 02/23/21  8:19 PM   Specimen: BLOOD  Result Value Ref Range Status   Specimen Description BLOOD LEFT ANTECUBITAL  Final   Special Requests   Final    BOTTLES DRAWN AEROBIC AND ANAEROBIC Blood Culture adequate volume   Culture   Final    NO GROWTH < 12 HOURS Performed at [facility], [address] [place], [place] [address]    Report Status PENDING  Incomplete  Culture, blood (routine x 2)     Status: None (Preliminary result)   Collection Time: 02/23/21  8:19 PM   Specimen: BLOOD  Result Value Ref Range Status   Specimen Description BLOOD BLOOD LEFT ARM  Final   Special Requests   Final    BOTTLES DRAWN AEROBIC AND ANAEROBIC Blood Culture adequate volume   Culture   Final    NO GROWTH < 12 HOURS  Performed at [facility], [address] [place], [place] [address]    Report Status PENDING  Incomplete  MRSA Next Gen by PCR, Nasal     Status: None   Collection Time: 02/23/21 10:07 PM   Specimen: Nasal Mucosa; Nasal Swab  Result Value Ref Range Status   MRSA by PCR Next Gen NOT DETECTED NOT DETECTED Final    Comment: (NOTE) The GeneXpert MRSA Assay (FDA approved for NASAL specimens only), is one component of a comprehensive MRSA colonization surveillance program. It is not intended to diagnose MRSA infection nor to guide or monitor treatment for MRSA infections. Test performance is not FDA approved in patients less than 85 years old. Performed at [facility], [address] [place], [place] [address]          Radiology Studies: CT Angio Chest PE W and/or  Wo Contrast  Addendum Date: 02/24/2021   ADDENDUM REPORT: 02/24/2021 07:47 ADDENDUM: As stated in the original dictation, left-sided pleural metastasis, widespread pulmonary metastases and hepatic metastases all appear increased compared to prior examinations. In addition, there is consolidative airspace disease in the left lower lobe, concerning for pneumonia. Electronically Signed   By: Vinnie Langton M.D.   On: 02/24/2021 07:47   Result Date: 02/24/2021 CLINICAL DATA:  Pulmonary embolism suspected. High probability. Metastatic breast cancer. Shortness of breath. EXAM: CT ANGIOGRAPHY CHEST WITH CONTRAST TECHNIQUE: Multidetector CT imaging of the chest was performed using the standard protocol during bolus administration of intravenous contrast. Multiplanar CT image reconstructions and MIPs were obtained to evaluate the vascular anatomy. RADIATION DOSE REDUCTION: This exam was performed according to the departmental dose-optimization program which includes automated exposure control, adjustment of the mA and/or kV according to patient size and/or use of iterative reconstruction technique. CONTRAST:  143mL OMNIPAQUE IOHEXOL 350 MG/ML SOLN COMPARISON:  Chest radiography same day.  Chest CT 01/27/2021. FINDINGS: Cardiovascular: Cardiomegaly with left ventricular prominence. No visible aortic atherosclerotic calcification. Pulmonary arterial opacification is good. There are no pulmonary emboli. Mediastinum/Nodes: Over the last month, there has been considerable growth and mediastinal tumor. Anterior mediastinal disease previously measured at 15 and 17 mm on axial image 32 now measures 17 and 20 mm. Increased tumor growth in the subcarinal region and left hilum and along the left pericardial margin. Lungs/Pleura: Marked worsening of pulmonary metastatic disease. Innumerable nodules scattered throughout the lungs measuring 9 mm in size and smaller. There is worsening collapse of the left lower lobe. Lobular pleural  disease on the left has worsened. Upper Abdomen: Worsening of hepatic metastatic disease. More numerous and larger metastatic lesions. For example, metastasis at the dome of the liver on the right previously measured 18 mm in diameter and now measures 21 mm in diameter. Musculoskeletal: Lytic sclerotic metastatic disease throughout the spine and ribs appears similar. Review of the MIP images confirms the above findings. IMPRESSION: No pulmonary emboli. Progression of metastatic tumor within the mediastinum, left hilum, manifest as nodular metastatic lesions throughout both lungs, and within the liver. Worsened volume loss in the left lower lobe with progression of pleural disease. This interpretation is rendered on an emergent basis in order to rule out pulmonary emboli. The exam will be reviewed and addended by body/oncologic specialist radiologist tomorrow. Electronically Signed: By: Nelson Chimes M.D. On: 02/23/2021 21:42   DG Chest Portable 1 View  Result Date: 02/23/2021 CLINICAL DATA:  Chest pain and shortness of breath. History of metastatic breast cancer. EXAM: PORTABLE CHEST 1 VIEW COMPARISON:  02/21/2021 and  prior radiograph FINDINGS: The cardiomediastinal silhouette is unchanged. LEFT LOWER lung consolidation/atelectasis and LEFT pleural thickening again noted. No new pulmonary findings are noted. There is no evidence of pneumothorax or acute bony abnormality. IMPRESSION: Unchanged appearance of the chest with LEFT LOWER lung consolidation/atelectasis and LEFT pleural thickening. Electronically Signed   By: Margarette Canada M.D.   On: 02/23/2021 20:42        Scheduled Meds:  atomoxetine  40 mg Oral Daily   enoxaparin (LOVENOX) injection  40 mg Subcutaneous Q24H   guanFACINE  2 mg Oral Daily   morphine  30 mg Oral Q8H   venlafaxine XR  150 mg Oral QHS   Continuous Infusions:  ceFEPime (MAXIPIME) IV Stopped (02/24/21 0600)   lactated ringers Stopped (02/24/21 1006)     LOS: 1 day   Time  spent= 35 mins    Burtis Imhoff Arsenio Loader, MD [facility]  If 7PM-7AM, please contact night-coverage  02/24/2021, 10:09 AM

## 2021-02-24 NOTE — ED Notes (Signed)
Pt reports no improvement of chest pressure after morphine administration.

## 2021-02-25 ENCOUNTER — Other Ambulatory Visit: Payer: Self-pay | Admitting: Oncology

## 2021-02-25 ENCOUNTER — Ambulatory Visit: Payer: BC Managed Care – PPO

## 2021-02-25 ENCOUNTER — Encounter
Admission: EM | Disposition: A | Discharge status: Home / Self Care | Payer: Self-pay | Source: Home / Self Care | Attending: Internal Medicine

## 2021-02-25 DIAGNOSIS — Z170 Estrogen receptor positive status [ER+]: Secondary | ICD-10-CM

## 2021-02-25 DIAGNOSIS — I10 Essential (primary) hypertension: Secondary | ICD-10-CM | POA: Diagnosis not present

## 2021-02-25 DIAGNOSIS — F988 Other specified behavioral and emotional disorders with onset usually occurring in childhood and adolescence: Secondary | ICD-10-CM | POA: Diagnosis not present

## 2021-02-25 DIAGNOSIS — I214 Non-ST elevation (NSTEMI) myocardial infarction: Secondary | ICD-10-CM

## 2021-02-25 DIAGNOSIS — C50411 Malignant neoplasm of upper-outer quadrant of right female breast: Secondary | ICD-10-CM

## 2021-02-25 DIAGNOSIS — R0600 Dyspnea, unspecified: Secondary | ICD-10-CM | POA: Diagnosis not present

## 2021-02-25 DIAGNOSIS — J189 Pneumonia, unspecified organism: Secondary | ICD-10-CM | POA: Diagnosis not present

## 2021-02-25 HISTORY — PX: LEFT HEART CATH AND CORONARY ANGIOGRAPHY: CATH118249

## 2021-02-25 LAB — COMPREHENSIVE METABOLIC PANEL
ALT: 15 U/L (ref 0–44)
AST: 27 U/L (ref 15–41)
Albumin: 3 g/dL — ABNORMAL LOW (ref 3.5–5.0)
Alkaline Phosphatase: 57 U/L (ref 38–126)
Anion gap: 6 (ref 5–15)
BUN: 9 mg/dL (ref 6–20)
CO2: 27 mmol/L (ref 22–32)
Calcium: 8.1 mg/dL — ABNORMAL LOW (ref 8.9–10.3)
Chloride: 104 mmol/L (ref 98–111)
Creatinine, Ser: 0.57 mg/dL (ref 0.44–1.00)
GFR, Estimated: >60 mL/min (ref >60)
Glucose, Bld: 116 mg/dL — ABNORMAL HIGH (ref 70–99)
Potassium: 4.1 mmol/L (ref 3.5–5.1)
Sodium: 137 mmol/L (ref 135–145)
Total Bilirubin: 0.5 mg/dL (ref 0.3–1.2)
Total Protein: 6 g/dL — ABNORMAL LOW (ref 6.5–8.1)

## 2021-02-25 LAB — APTT: aPTT: 36 seconds (ref 24–36)

## 2021-02-25 LAB — CBC
HCT: 29 % — ABNORMAL LOW (ref 36.0–46.0)
Hemoglobin: 9.9 g/dL — ABNORMAL LOW (ref 12.0–15.0)
MCH: 33.8 pg (ref 26.0–34.0)
MCHC: 34.1 g/dL (ref 30.0–36.0)
MCV: 99 fL (ref 80.0–100.0)
Platelets: 282 10*3/uL (ref 150–400)
RBC: 2.93 MIL/uL — ABNORMAL LOW (ref 3.87–5.11)
RDW: 13.1 % (ref 11.5–15.5)
WBC: 10.9 10*3/uL — ABNORMAL HIGH (ref 4.0–10.5)
nRBC: 0 % (ref 0.0–0.2)

## 2021-02-25 LAB — LIPID PANEL
Cholesterol: 106 mg/dL (ref 0–200)
HDL: 30 mg/dL — ABNORMAL LOW (ref >40)
LDL Cholesterol: 53 mg/dL (ref 0–99)
Total CHOL/HDL Ratio: 3.5 RATIO
Triglycerides: 117 mg/dL (ref <150)
VLDL: 23 mg/dL (ref 0–40)

## 2021-02-25 LAB — CARDIAC CATHETERIZATION: Cath EF Quantitative: 60 %

## 2021-02-25 LAB — HEPARIN LEVEL (UNFRACTIONATED): Heparin Unfractionated: <0.1 IU/mL — ABNORMAL LOW (ref 0.30–0.70)

## 2021-02-25 LAB — TSH: TSH: 0.29 u[IU]/mL — ABNORMAL LOW (ref 0.350–4.500)

## 2021-02-25 LAB — TROPONIN I (HIGH SENSITIVITY): Troponin I (High Sensitivity): 1125 ng/L (ref <18)

## 2021-02-25 LAB — MAGNESIUM: Magnesium: 2.1 mg/dL (ref 1.7–2.4)

## 2021-02-25 SURGERY — LEFT HEART CATH AND CORONARY ANGIOGRAPHY
Anesthesia: Moderate Sedation

## 2021-02-25 MED ORDER — ONDANSETRON HCL 4 MG/2ML IJ SOLN: 4.0000 mg | Freq: Four times a day (QID) | INTRAMUSCULAR | Status: DC | PRN

## 2021-02-25 MED ORDER — SODIUM CHLORIDE 0.9 % IV SOLN: 250.0000 mL | INTRAVENOUS | Status: DC | PRN

## 2021-02-25 MED ORDER — SODIUM CHLORIDE 0.9% FLUSH: 3.0000 mL | Freq: Two times a day (BID) | INTRAVENOUS | Status: DC

## 2021-02-25 MED ORDER — VERAPAMIL HCL 2.5 MG/ML IV SOLN
INTRAVENOUS | Status: AC
  Filled 2021-02-25: qty 2

## 2021-02-25 MED ORDER — SODIUM CHLORIDE 0.9% FLUSH
3.0000 mL | Freq: Two times a day (BID) | INTRAVENOUS | Status: DC
  Administered 2021-02-25: 3 mL via INTRAVENOUS

## 2021-02-25 MED ORDER — SODIUM CHLORIDE 0.9% FLUSH
10.0000 mL | Freq: Two times a day (BID) | INTRAVENOUS | Status: DC
  Administered 2021-02-25: 01:00:00 10 mL
  Administered 2021-02-25: 40 mL
  Administered 2021-02-26: 10 mL

## 2021-02-25 MED ORDER — LABETALOL HCL 5 MG/ML IV SOLN: 10.0000 mg | INTRAVENOUS | Status: AC | PRN

## 2021-02-25 MED ORDER — HEPARIN SODIUM (PORCINE) 1000 UNIT/ML IJ SOLN
INTRAMUSCULAR | Status: AC
  Filled 2021-02-25: qty 10

## 2021-02-25 MED ORDER — FENTANYL CITRATE (PF) 100 MCG/2ML IJ SOLN
INTRAMUSCULAR | Status: DC | PRN
  Administered 2021-02-25: 25 ug via INTRAVENOUS

## 2021-02-25 MED ORDER — SODIUM CHLORIDE 0.9 % WEIGHT BASED INFUSION: 1.0000 mL/kg/h | INTRAVENOUS | Status: DC

## 2021-02-25 MED ORDER — VERAPAMIL HCL 2.5 MG/ML IV SOLN
INTRAVENOUS | Status: DC | PRN
Start: 2021-02-25 — End: 2021-02-25
  Administered 2021-02-25: 2.5 mg via INTRA_ARTERIAL

## 2021-02-25 MED ORDER — HEPARIN SODIUM (PORCINE) 1000 UNIT/ML IJ SOLN
INTRAMUSCULAR | Status: DC | PRN
  Administered 2021-02-25: 4000 [IU] via INTRAVENOUS

## 2021-02-25 MED ORDER — SODIUM CHLORIDE 0.9% FLUSH: 10.0000 mL | INTRAVENOUS | Status: DC | PRN

## 2021-02-25 MED ORDER — HEPARIN BOLUS VIA INFUSION
2000.0000 [IU] | Freq: Once | INTRAVENOUS | Status: AC
  Administered 2021-02-25: 2000 [IU] via INTRAVENOUS
  Filled 2021-02-25: qty 2000

## 2021-02-25 MED ORDER — ASPIRIN 81 MG PO CHEW: 81.0000 mg | CHEWABLE_TABLET | ORAL | Status: DC

## 2021-02-25 MED ORDER — ACETAMINOPHEN 325 MG PO TABS: 650.0000 mg | ORAL_TABLET | ORAL | Status: DC | PRN

## 2021-02-25 MED ORDER — SODIUM CHLORIDE 0.9% FLUSH: 3.0000 mL | INTRAVENOUS | Status: DC | PRN

## 2021-02-25 MED ORDER — SODIUM CHLORIDE 0.9 % WEIGHT BASED INFUSION
1.0000 mL/kg/h | INTRAVENOUS | Status: DC
  Administered 2021-02-25: 1 mL/kg/h via INTRAVENOUS

## 2021-02-25 MED ORDER — SODIUM CHLORIDE 0.9 % WEIGHT BASED INFUSION
3.0000 mL/kg/h | INTRAVENOUS | Status: DC
  Administered 2021-02-25: 3 mL/kg/h via INTRAVENOUS

## 2021-02-25 MED ORDER — HYDRALAZINE HCL 20 MG/ML IJ SOLN: 10.0000 mg | INTRAMUSCULAR | Status: AC | PRN

## 2021-02-25 MED ORDER — SODIUM CHLORIDE 0.9 % IV SOLN: INTRAVENOUS | Status: DC

## 2021-02-25 MED ORDER — SODIUM CHLORIDE 0.9% FLUSH
3.0000 mL | Freq: Two times a day (BID) | INTRAVENOUS | Status: DC
  Administered 2021-02-25 – 2021-02-26 (×2): 3 mL via INTRAVENOUS

## 2021-02-25 MED ORDER — LIDOCAINE HCL (PF) 1 % IJ SOLN
INTRAMUSCULAR | Status: DC | PRN
  Administered 2021-02-25: 2 mL

## 2021-02-25 MED ORDER — HEPARIN (PORCINE) IN NACL 1000-0.9 UT/500ML-% IV SOLN
INTRAVENOUS | Status: AC
  Filled 2021-02-25: qty 1000

## 2021-02-25 MED ORDER — IOHEXOL 300 MG/ML  SOLN
INTRAMUSCULAR | Status: DC | PRN
  Administered 2021-02-25: 38 mL

## 2021-02-25 MED ORDER — MIDAZOLAM HCL 2 MG/2ML IJ SOLN
INTRAMUSCULAR | Status: DC | PRN
  Administered 2021-02-25: 1 mg via INTRAVENOUS

## 2021-02-25 MED ORDER — LIDOCAINE HCL 1 % IJ SOLN
INTRAMUSCULAR | Status: AC
  Filled 2021-02-25: qty 20

## 2021-02-25 MED ORDER — MIDAZOLAM HCL 2 MG/2ML IJ SOLN
INTRAMUSCULAR | Status: AC
Start: 2021-02-25 — End: ?
  Filled 2021-02-25: qty 2

## 2021-02-25 MED ORDER — FENTANYL CITRATE (PF) 100 MCG/2ML IJ SOLN
INTRAMUSCULAR | Status: AC
  Filled 2021-02-25: qty 2

## 2021-02-25 MED ORDER — HYDROMORPHONE HCL 1 MG/ML IJ SOLN
INTRAMUSCULAR | Status: AC
  Administered 2021-02-25: 1 mg via INTRAVENOUS
  Filled 2021-02-25: qty 1

## 2021-02-25 SURGICAL SUPPLY — 10 items
CATH 5FR JL3.5 JR4 ANG PIG MP (CATHETERS) ×1 IMPLANT
DEVICE RAD TR BAND REGULAR (VASCULAR PRODUCTS) ×1 IMPLANT
DRAPE BRACHIAL (DRAPES) ×1 IMPLANT
GLIDESHEATH SLEND SS 6F .021 (SHEATH) ×1 IMPLANT
GUIDEWIRE INQWIRE 1.5J.035X260 (WIRE) IMPLANT
INQWIRE 1.5J .035X260CM (WIRE) ×2
PACK CARDIAC CATH (CUSTOM PROCEDURE TRAY) ×2 IMPLANT
PROTECTION STATION PRESSURIZED (MISCELLANEOUS) ×2
SET ATX SIMPLICITY (MISCELLANEOUS) ×1 IMPLANT
STATION PROTECTION PRESSURIZED (MISCELLANEOUS) IMPLANT

## 2021-02-25 NOTE — Assessment & Plan Note (Addendum)
Trops 108> 764 > 1824 > 1125  Continue ASA 325mg , Nitro Ointment 1inch, Nitro SL prn, Heparin Drip x 48 hrs, Metoprolol 25mg  po BID, IV lopressor prn.  Patient underwent left heart cath which came out completely normal,  no CAD.  Patient is cleared from cardiology to be discharged. LDL 117

## 2021-02-25 NOTE — Progress Notes (Signed)
Verbal order from Dr. Reesa Chew to remove nitro ointment. Patient sitting on side of bed during their discussion and states she is feeling well.

## 2021-02-25 NOTE — Consult Note (Signed)
[place] NOTE       Patient ID: MACKENZYE MACKEL MRN: [id number] DOB/AGE: 03-24-1980 41 y.o.  Admit date: 02/23/2021 Referring Physician Dr. Gerlean Ren Primary Physician Dr Dion Body Primary Cardiologist none Reason for Consultation chest pain, elevated troponin  HPI: The patient is a 41 year old female with a past medical history notable for metastatic breast cancer to the lung, bone on Xeloda and palliative radiation, s/p laparoscopic BSO with IUD removal on 02/17/2021, hypertension, ADHD, anxiety who presented to [facility] ED 02/23/2021 with shortness of breath and chest discomfort.  She was diagnosed with left lower lobe pneumonia last week and took 2 days of Levaquin but presented to the ED due to persistence of her symptoms.  Cardiology is consulted because of her elevated troponin and chest pain.  The patient states on admission she was having some shortness of breath with associated dry cough and chest tightness that was initially thought to be 2/2 her metastatic cancer and pneumonia.  She was started on empiric vancomycin and cefepime and IV fluids.  She remains on cefepime alone. Initially on 2/19 her troponins were flat at 5-5.  EKG was nonischemic.  She states she had 2 occurrences of "extreme central chest pressure," once in the ED and again the evening of 2/20 around 5 PM.  The second episode lasted about an hour.  Repeat troponin was initially elevated at 103 and uptrended to a peak of 1824 late last night and now 1125.  EKG on 2/20 showed sinus tach with rate of 112 with some T wave flattening inferiorly but otherwise without acute ST changes.  She received 324 mg aspirin, topical Nitropaste, and was started on metoprolol.  She has been getting Dilaudid and sustained-release morphine as well.  She denies recurrence of her pain since yesterday evening.  Denies palpitations, dizziness, lower extremity edema.  Vitals are notable for an overnight temperature of  101.7 now she is afebrile at 98.7 and BP 131/74. SPO2 98% on room air.   Labs are remarkable for a creatinine 0.57, EGFR greater than 60.  Troponins trended [phone].  Total cholesterol 106, HDL 30, LDL 53, triglycerides 117.  WBCs downtrending from 14.4-12.4-10.9.  H&H downtrending 11.9/36.3, 9.9/29.  Current platelets 282. TSH 0.29  Review of systems complete and found to be negative unless listed above     Past Medical History:  Diagnosis Date   Chronic constipation    Chronic narcotic use    Chronic nausea    due to taking chemo drug   Family history of breast cancer    GAD (generalized anxiety disorder)    History of pregnancy induced hypertension    HSV (herpes simplex virus) anogenital infection    positive titer only   Lung metastasis ([place]) 07/2020   secondary to primiary breast cancer   Malignant neoplasm of upper-outer quadrant of right breast in female, estrogen receptor positive [facility])    oncologist--- dr Grayland Ormond;;  first dx 09/ 2020 s/p right lumpectomy w/ node dissection and completed chemoradiation 08/ 2021;;  recurrent 07/ 2022 to lung pleura,liver, and thoracic spine   SOB (shortness of breath)    02-12-2021  pt denies with regular activity but sob with stairs    Past Surgical History:  Procedure Laterality Date   ABDOMINAL HYSTERECTOMY N/A 02/17/2021   BREAST LUMPECTOMY WITH AXILLARY LYMPH NODE BIOPSY Right 11/21/2018   Procedure: RIGHT BREAST LUMPECTOMY WITH SENTINEL LYMPH NODE BIOPSY;  Surgeon: Jovita Kussmaul, MD;  Location: [place];  Service:  General;  Laterality: Right;   BREAST REDUCTION SURGERY Bilateral 11/21/2018   Procedure: BILATERAL MAMMARY REDUCTION  (BREAST);  Surgeon: Wallace Going, DO;  Location: [place];  Service: Plastics;  Laterality: Bilateral;   CHEST TUBE INSERTION Left 07/29/2020   Procedure: INSERTION PLEURAL DRAINAGE CATHETER;  Surgeon: Lajuana Matte, MD;  Location: [place];  Service: Thoracic;  Laterality: Left;    IRRIGATION AND DEBRIDEMENT STERNOCLAVICULAR JOINT-STERNUM AND RIBS N/A 08/13/2020   Procedure: IRRIGATION AND DEBRIDEMENT CHEST WALL ABSCESS;  Surgeon: Lajuana Matte, MD;  Location: [place] [facility];  Service: Cardiothoracic;  Laterality: N/A;   IUD REMOVAL N/A 02/17/2021   Procedure: INTRAUTERINE DEVICE (IUD) REMOVAL;  Surgeon: Linda Hedges, DO;  Location: [place];  Service: Gynecology;  Laterality: N/A;   LAPAROSCOPIC SALPINGO OOPHERECTOMY Bilateral 02/17/2021   Procedure: LAPAROSCOPIC BILATERAL SALPINGO OOPHORECTOMY;  Surgeon: Linda Hedges, DO;  Location: [place];  Service: Gynecology;  Laterality: Bilateral;   PLEURAL BIOPSY Left 07/29/2020   Procedure: PLEURAL BIOPSY;  Surgeon: Lajuana Matte, MD;  Location: [place];  Service: Thoracic;  Laterality: Left;   PLEURAL EFFUSION DRAINAGE Left 07/29/2020   Procedure: DRAINAGE OF PLEURAL EFFUSION;  Surgeon: Lajuana Matte, MD;  Location: [place];  Service: Thoracic;  Laterality: Left;   PORT-A-CATH REMOVAL N/A 07/06/2019   Procedure: REMOVAL PORT-A-CATH;  Surgeon: Jovita Kussmaul, MD;  Location: [place];  Service: General;  Laterality: N/A;   PORTACATH PLACEMENT N/A 11/21/2018   Procedure: INSERTION LEFT PORT-A-CATH WITH ULTRASOUND GUIDANCE;  Surgeon: Jovita Kussmaul, MD;  Location: [place];  Service: General;  Laterality: N/A;   [place] Left 07/29/2020   Procedure: VIDEO ASSISTED THORACOSCOPY;  Surgeon: Lajuana Matte, MD;  Location: [facility];  Service: Thoracic;  Laterality: Left;   WISDOM TOOTH EXTRACTION      Medications Prior to Admission  Medication Sig Dispense Refill Last Dose   albuterol (VENTOLIN HFA) 108 (90 Base) MCG/ACT inhaler INHALE 2 PUFFS INTO THE LUNGS EVERY 6 HOURS AS NEEDED FOR WHEEZING OR SHORTNESS OF BREATH 6.7 g 2 02/23/2021   ALPRAZolam (XANAX) 0.25 MG tablet Take 1 tablet (0.25 mg total) by mouth 2 (two) times daily as  needed for anxiety. 60 tablet 0 02/23/2021   Calcium Citrate-Vitamin D (CALCIUM + D PO) Take 1 tablet by mouth 3 (three) times daily.   02/23/2021   capecitabine (XELODA) 500 MG tablet Take 4 tablets (2,000 mg total) by mouth 2 (two) times daily after a meal. Take for 14 days, then hold for 7 days. Repeat every 21 days. 112 tablet 1 Past Week   Cholecalciferol (VITAMIN D3) 50 MCG (2000 UT) TABS Take 1 tablet by mouth daily at 12 noon.   02/23/2021   levofloxacin (LEVAQUIN) 500 MG tablet Take 1 tablet (500 mg total) by mouth daily for 7 days. 7 tablet 0 02/23/2021   morphine (MS CONTIN) 15 MG 12 hr tablet Take 2 tablets (30 mg total) by mouth every 8 (eight) hours. 90 tablet 0 02/23/2021 at 1700   oxyCODONE (OXY IR/ROXICODONE) 5 MG immediate release tablet Take 1 tablet (5 mg total) by mouth every 6 (six) hours as needed for severe pain. 90 tablet 0 02/23/2021 at 1500   promethazine (PHENERGAN) 25 MG tablet Take 1 tablet (25 mg total) by mouth every 6 (six) hours as needed for nausea or vomiting. 30 tablet 2 02/23/2021   acetaminophen (TYLENOL) 500 MG tablet Take 1,000 mg  by mouth every 6 (six) hours as needed.   prn at unknown   Ascorbic Acid (VITAMIN C) 500 MG CAPS Take 500 mg by mouth daily. (Patient not taking: Reported on 02/12/2021)      atomoxetine (STRATTERA) 40 MG capsule Take 40 mg by mouth daily.   prn at unknown   chlorpheniramine-HYDROcodone (TUSSIONEX) 10-8 MG/5ML SUER Take 5 mLs by mouth every 12 (twelve) hours as needed for cough. (Patient not taking: Reported on 02/12/2021) 140 mL 0    docusate sodium (COLACE) 100 MG capsule Take 100 mg by mouth 2 (two) times daily as needed for mild constipation.   prn at unknown   guanFACINE (INTUNIV) 2 MG TB24 ER tablet Take 2 mg by mouth daily.   prn at unknown   ibuprofen (ADVIL) 200 MG tablet Take 400 mg by mouth every 6 (six) hours as needed for mild pain.   prn at unknown   Multiple Vitamin (MULTIVITAMIN WITH MINERALS) TABS tablet Take 1 tablet by mouth  daily. (Patient not taking: Reported on 02/12/2021)      ondansetron (ZOFRAN) 8 MG tablet Take 1 tablet (8 mg total) by mouth every 8 (eight) hours as needed for nausea or vomiting. 30 tablet 1 prn at unknown   palbociclib (IBRANCE) 125 MG tablet Take 125 mg by mouth daily. Take for 21 days on, 7 days off, repeat every 28 days. (Patient not taking: Reported on 02/24/2021)   Not Taking   polyethylene glycol (MIRALAX / GLYCOLAX) 17 g packet Take 17 g by mouth daily. (Patient taking differently: Take 17 g by mouth daily as needed for mild constipation.) 14 each 0 prn at unknown   Tetrahydrozoline HCl (VISINE OP) Place 1 drop into both eyes daily as needed (redness).   prn at unknown   venlafaxine XR (EFFEXOR-XR) 150 MG 24 hr capsule TAKE 1 CAPSULE(150 MG) BY MOUTH DAILY WITH BREAKFAST (Patient taking differently: 150 mg at bedtime.) 30 capsule 1 02/22/2021 at 2000   vitamin B-12 (CYANOCOBALAMIN) 1000 MCG tablet Take 1,000 mcg by mouth daily. (Patient not taking: Reported on 02/12/2021)      Social History   Socioeconomic History   Marital status: Married    Spouse name: Not on file   Number of children: Not on file   Years of education: Not on file   Highest education level: Not on file  Occupational History   Not on file  Tobacco Use   Smoking status: Former    Packs/day: 0.50    Years: 20.00    Pack years: 10.00    Types: Cigarettes    Start date: 01/13/2002    Quit date: 10/17/2016    Years since quitting: 4.3   Smokeless tobacco: Never  Vaping Use   Vaping Use: Never used  Substance and Sexual Activity   Alcohol use: Yes    Comment: Socially   Drug use: Never   Sexual activity: Not on file  Other Topics Concern   Not on file  Social History Narrative   Not on file   Social Determinants of Health   Financial Resource Strain: Not on file  Food Insecurity: Not on file  Transportation Needs: Not on file  Physical Activity: Not on file  Stress: Not on file  Social Connections: Not  on file  Intimate Partner Violence: Not on file    Family History  Problem Relation Age of Onset   Hypertension Father    Alcohol abuse Paternal Grandfather    Heart disease Paternal  Grandfather    Alcohol abuse Maternal Grandfather    Heart disease Maternal Grandmother    Breast cancer Other       Review of systems complete and found to be negative unless listed above    PHYSICAL EXAM General: Pleasant Caucasian female, well nourished, in no acute distress.  Sitting upright in hospital bed HEENT:  Normocephalic and atraumatic. Neck:  No JVD.  Lungs: Normal respiratory effort on room air. Clear bilaterally to auscultation. No wheezes, crackles, rhonchi.  Heart: HRRR . Normal S1 and S2 without gallops or murmurs. Radial & DP pulses 2+ bilaterally. Abdomen: Non-distended appearing.  Msk: Normal strength and tone for age. Extremities: Warm and well perfused. No clubbing, cyanosis.  No lower extremity edema.  Neuro: Alert and oriented X 3. Psych:  Answers questions appropriately.   Labs:   Lab Results  Component Value Date   WBC 10.9 (H) 02/25/2021   HGB 9.9 (L) 02/25/2021   HCT 29.0 (L) 02/25/2021   MCV 99.0 02/25/2021   PLT 282 02/25/2021    Recent Labs  Lab 02/25/21 0427  NA 137  K 4.1  CL 104  CO2 27  BUN 9  CREATININE 0.57  CALCIUM 8.1*  PROT 6.0*  BILITOT 0.5  ALKPHOS 57  ALT 15  AST 27  GLUCOSE 116*   No results found for: CKTOTAL, CKMB, CKMBINDEX, TROPONINI  Lab Results  Component Value Date   CHOL 106 02/25/2021   Lab Results  Component Value Date   HDL 30 (L) 02/25/2021   Lab Results  Component Value Date   LDLCALC 53 02/25/2021   Lab Results  Component Value Date   TRIG 117 02/25/2021   Lab Results  Component Value Date   CHOLHDL 3.5 02/25/2021   No results found for: LDLDIRECT    Radiology: DG Chest 2 View  Result Date: 02/21/2021 CLINICAL DATA:  A 41 year old female presents with shortness of breath and history of breast  cancer. EXAM: CHEST - 2 VIEW COMPARISON:  Chest x-ray from October 01, 2020 and CT of the chest from January 27, 2021. FINDINGS: Cardiomediastinal contours and hilar structures are stable. LEFT hemidiaphragm remains obscured. Signs of LEFT pleural effusion and basilar airspace process. Subtle background nodularity has increased in the RIGHT chest over time. No additional areas of frank consolidative changes. On limited assessment there is no acute skeletal process. Evidence of prior RIGHT axillary dissection presumably projecting over the RIGHT chest. IMPRESSION: 1. Malignant effusion in the LEFT chest with further consolidation or enlarging nodules and masses in this area. Difficult to exclude developing infection in the LEFT lung base. 2. Scattered small nodular densities in the RIGHT chest appearing more conspicuous than on previous imaging may represent a combination of known metastatic disease and superimposed infection. Electronically Signed   By: Zetta Bills M.D.   On: 02/21/2021 13:22   CT Angio Chest PE W and/or Wo Contrast  Addendum Date: 02/24/2021   ADDENDUM REPORT: 02/24/2021 07:47 ADDENDUM: As stated in the original dictation, left-sided pleural metastasis, widespread pulmonary metastases and hepatic metastases all appear increased compared to prior examinations. In addition, there is consolidative airspace disease in the left lower lobe, concerning for pneumonia. Electronically Signed   By: Vinnie Langton M.D.   On: 02/24/2021 07:47   Result Date: 02/24/2021 CLINICAL DATA:  Pulmonary embolism suspected. High probability. Metastatic breast cancer. Shortness of breath. EXAM: CT ANGIOGRAPHY CHEST WITH CONTRAST TECHNIQUE: Multidetector CT imaging of the chest was performed using the standard protocol during  bolus administration of intravenous contrast. Multiplanar CT image reconstructions and MIPs were obtained to evaluate the vascular anatomy. RADIATION DOSE REDUCTION: This exam was  performed according to the departmental dose-optimization program which includes automated exposure control, adjustment of the mA and/or kV according to patient size and/or use of iterative reconstruction technique. CONTRAST:  155mL OMNIPAQUE IOHEXOL 350 MG/ML SOLN COMPARISON:  Chest radiography same day.  Chest CT 01/27/2021. FINDINGS: Cardiovascular: Cardiomegaly with left ventricular prominence. No visible aortic atherosclerotic calcification. Pulmonary arterial opacification is good. There are no pulmonary emboli. Mediastinum/Nodes: Over the last month, there has been considerable growth and mediastinal tumor. Anterior mediastinal disease previously measured at 15 and 17 mm on axial image 32 now measures 17 and 20 mm. Increased tumor growth in the subcarinal region and left hilum and along the left pericardial margin. Lungs/Pleura: Marked worsening of pulmonary metastatic disease. Innumerable nodules scattered throughout the lungs measuring 9 mm in size and smaller. There is worsening collapse of the left lower lobe. Lobular pleural disease on the left has worsened. Upper Abdomen: Worsening of hepatic metastatic disease. More numerous and larger metastatic lesions. For example, metastasis at the dome of the liver on the right previously measured 18 mm in diameter and now measures 21 mm in diameter. Musculoskeletal: Lytic sclerotic metastatic disease throughout the spine and ribs appears similar. Review of the MIP images confirms the above findings. IMPRESSION: No pulmonary emboli. Progression of metastatic tumor within the mediastinum, left hilum, manifest as nodular metastatic lesions throughout both lungs, and within the liver. Worsened volume loss in the left lower lobe with progression of pleural disease. This interpretation is rendered on an emergent basis in order to rule out pulmonary emboli. The exam will be reviewed and addended by body/oncologic specialist radiologist tomorrow. Electronically Signed:  By: Nelson Chimes M.D. On: 02/23/2021 21:42   CT CHEST ABDOMEN PELVIS W CONTRAST  Result Date: 01/27/2021 CLINICAL DATA:  A 41 year old female presents for evaluation/follow-up of breast cancer in a LEFT side and mid back pain radiating to the front. EXAM: CT CHEST, ABDOMEN, AND PELVIS WITH CONTRAST TECHNIQUE: Multidetector CT imaging of the chest, abdomen and pelvis was performed following the standard protocol during bolus administration of intravenous contrast. RADIATION DOSE REDUCTION: This exam was performed according to the departmental dose-optimization program which includes automated exposure control, adjustment of the mA and/or kV according to patient size and/or use of iterative reconstruction technique. CONTRAST:  86mL OMNIPAQUE IOHEXOL 300 MG/ML  SOLN COMPARISON:  November 12, 2020. FINDINGS: CT CHEST FINDINGS Cardiovascular: Pericardial nodularity along the LEFT heart border and thickening along the LEFT mediastinal border is similar about the LEFT heart but increased about the superior mediastinum, see below. The aorta is normal caliber. Central pulmonary vasculature with normal caliber. Mediastinum/Nodes: Nodularity along the LEFT mediastinal border with increased, measuring 17 mm greatest thickness (image 23/2) at this same location previously 7 mm. Internal mammary lymph node/nodularity along internal mammary vessels (image 23/2) 15 mm greatest thickness, previously approximately 5-6 mm, increasingly solid since the prior study. Scattered small supraclavicular lymph nodes do not meet pathologic size criteria and are unchanged on the LEFT when compared to the previous study. LEFT retropectoral lymph nodes also similarly stable, for example a lymph node in the LEFT retropectoral region 7 mm (image 17/2) Changes of RIGHT axillary dissection and associated soft tissue distortion with similar appearance. Lungs/Pleura: Increasing pleural thickening in the LEFT chest and nodular soft tissue associated  with the LEFT hemidiaphragm, for instance (image 49/2) thickening in  the LEFT dependent, posterior pleural space and nodularity affecting the LEFT hemidiaphragm. New nodule along the under surface of the LEFT hemidiaphragm approximately 15 mm greatest thickness, thickening in the posterior LEFT pleural space approximately 17 mm greatest thickness previously 11 mm. Further volume loss in the inferior LEFT chest. Soft tissue in the anterior aspect of the inferior costodiaphragmatic recess (image 42/2) 17 18 mm greatest thickness with more [employer] appearance previously more fluid density in this area. Discrete nodule along the pleural surface in the dependent LEFT chest in the LEFT lower lobe (image 71/4) 15 mm. This is enlarged, previously in the 2-3 mm range in terms of greatest thickness within enlarging 6 mm nodule along the pleural surface on image 61/4 previously 2-3 mm. Parenchymal nodule in the superior segment of the LEFT lower lobe measures 4 mm previously 2 mm (image 42/4) confluent soft tissue contiguous with consolidative changes with subjective increase may reflect worsening of disease superimposed on rounded atelectasis (image 40/2) this measures 2.6 cm greatest axial dimension Scattered small pulmonary nodules in the RIGHT chest are similar to prior imaging. Dominant nodule in the RIGHT lower lobe at approximately 6-7 mm (image 90/4). Infiltration of the extrapleural fat abuts the aorta on image 43 of series 2 and contacts the anterior aspect of the T11 and T12 vertebral bodies. Musculoskeletal: See below for full musculoskeletal details. Skin thickening along the anterior RIGHT breast and soft tissue nodularity with in the RIGHT breast parenchyma with similar appearance to prior imaging. CT ABDOMEN PELVIS FINDINGS Hepatobiliary: New area of low attenuation in the RIGHT hepatic lobe (image 49/2) 18 mm greatest axial dimension, not present previously. Similar appearing area of low attenuation  in the RIGHT hepatic lobe along the boundary of medial segment LEFT and medial RIGHT hepatic lobe (image 59/2) 21 mm. Stable area of low attenuation in the periphery of the RIGHT hepatic lobe (image 54/2) the portal vein is patent. No pericholecystic stranding. No biliary duct dilation. Pancreas: Normal, without mass, inflammation or ductal dilatation. Spleen: Normal. Adrenals/Urinary Tract: The LEFT adrenal is in close proximity to nodularity along the surface of the LEFT hemidiaphragm now more prominent on the abdominal side of the diaphragm (image 55/2) 8 mm nodule contacts the LEFT adrenal gland. RIGHT adrenal gland is normal. Kidneys with normal CT appearance, no signs of hydronephrosis. No perivesical stranding. No suspicious renal lesion. Stomach/Bowel: Small hiatal hernia. No perigastric stranding or focal gastric thickening to the extent evaluated by CT. No signs of bowel obstruction or acute bowel process normal appendix. Vascular/Lymphatic: Aorta with smooth contours. IVC with smooth contours. No aneurysmal dilation of the abdominal aorta. There is no gastrohepatic or hepatoduodenal ligament lymphadenopathy. No retroperitoneal or mesenteric lymphadenopathy. No pelvic sidewall lymphadenopathy. Reproductive: IUD in situ.  No adnexal mass. Other: No ascites.  No free air. Musculoskeletal: Scattered sclerotic foci in the pelvis, for instance on image 82/2) this measures 6 mm and is unchanged likewise other small sclerotic foci in the pelvis are similarly without change. Scattered sclerotic foci in the spine, for instance at the L4 level on the LEFT (image 77/2) unchanged. Lytic lesion along the LEFT lateral aspect of the T10 vertebral body 10 mm previously with sclerotic border, no longer with sclerotic border, previously 8 mm. Multiple areas of fracture and rib destruction involving LEFT-sided ribs, overlapping of LEFT seventh rib is similar to the prior exam. There is further sclerosis of these areas when  compared to previous imaging involvement of multiple ribs along the LEFT  chest is otherwise unchanged IMPRESSION: 1. Worsening of disease in the chest with increasing pleural thickening and LEFT hemidiaphragmatic nodularity both above and below the LEFT hemidiaphragm. 2. Increasing size of parenchymal nodules in the LEFT chest abutting the pleural surface with stable appearance of RIGHT-sided pulmonary nodules. 3. Worsening of hepatic metastatic disease. 4. Nodularity along the under surface of the LEFT hemidiaphragm contacts the LEFT adrenal gland. 5. Similar appearance of destructive changes and pathologic fracture of LEFT-sided ribs in terms of lytic changes and areas of fracture. Increasing sclerosis in this area is the only appreciable change. 6. Increasing size of lytic focus in T10 and unchanged appearance of sclerotic lesions in the pelvis and spine. 7. Aortic atherosclerosis. Aortic Atherosclerosis (ICD10-I70.0). Electronically Signed   By: Zetta Bills M.D.   On: 01/27/2021 16:35   DG Chest Port 1 View  Result Date: 02/24/2021 CLINICAL DATA:  Shortness of breath, chest pain EXAM: PORTABLE CHEST 1 VIEW COMPARISON:  Previous studies including the examination of 02/23/2021 FINDINGS: Transverse diameter of heart is increased. There is opacification of left lower lung fields consistent with pleural effusion and possibly underlying infiltrate with no significant change. Are possible small nodular densities in the right mid and right lower lung fields. Right lateral CP angle is clear. There is no pneumothorax. IMPRESSION: No significant interval changes are noted in the opacification of left lower lung fields suggesting pleural effusion and possibly underlying infiltrate. Electronically Signed   By: Elmer Picker M.D.   On: 02/24/2021 15:06   DG Chest Portable 1 View  Result Date: 02/23/2021 CLINICAL DATA:  Chest pain and shortness of breath. History of metastatic breast cancer. EXAM: PORTABLE  CHEST 1 VIEW COMPARISON:  02/21/2021 and prior radiograph FINDINGS: The cardiomediastinal silhouette is unchanged. LEFT LOWER lung consolidation/atelectasis and LEFT pleural thickening again noted. No new pulmonary findings are noted. There is no evidence of pneumothorax or acute bony abnormality. IMPRESSION: Unchanged appearance of the chest with LEFT LOWER lung consolidation/atelectasis and LEFT pleural thickening. Electronically Signed   By: Margarette Canada M.D.   On: 02/23/2021 20:42    ECHO 10/27/2018  1. Left ventricular ejection fraction, by visual estimation, is 55 to  60%. The left ventricle has normal function. Normal left ventricular size.  There is no left ventricular hypertrophy.   2. Global right ventricle has normal systolic function.The right  ventricular size is normal. No increase in right ventricular wall  thickness.   3. Left atrial size was normal.   4. Right atrial size was normal.   5. The mitral valve is normal in structure. Mild mitral valve  regurgitation. No evidence of mitral stenosis.   6. The tricuspid valve is normal in structure. Tricuspid valve  regurgitation is trivial.   7. The aortic valve is normal in structure. Aortic valve regurgitation  was not visualized by color flow Doppler. Structurally normal aortic  valve, with no evidence of sclerosis or stenosis.   8. The pulmonic valve was normal in structure. Pulmonic valve  regurgitation is not visualized by color flow Doppler.   9. Normal pulmonary artery systolic pressure.  10. The inferior vena cava is normal in size with greater than 50%  respiratory variability, suggesting right atrial pressure of 3 mmHg.  11. The average left ventricular global longitudinal strain is -17.8 %.   TELEMETRY reviewed by me: Sinus rhythm rate 99, 3 beats of VT at 0618 otherwise no acute events  EKG reviewed by me: sinus tach rate 112, inferior T wave flattening  ASSESSMENT AND PLAN:  The patient is a 41 year old female  with a past medical history notable for metastatic breast cancer to the lung, bone on Xeloda and palliative radiation, s/p laparoscopic BSO with IUD removal on 02/17/2021, hypertension, ADHD, anxiety who presented to [facility] ED 02/23/2021 with shortness of breath and chest discomfort.  She was diagnosed with left lower lobe pneumonia last week and took 2 days of Levaquin but presented to the ED due to persistence of her symptoms.  Cardiology is consulted because of her elevated troponin and chest pain.  #chest pain #NSTEMI #Left lower lobe pneumonia  #metastatic breast cancer on Xeloda Patient admits to 2 episodes of "extreme chest pressure" both lasting about an hour, most recently the evening of 2/20 around 5 PM.  EKG showed sinus tach and was without acute ST changes.  Troponin was flat at 5 on 2/19 and was checked again with recurrence of chest pressure on 2/20 and trended [id number].  -Agree with current medical therapy for pneumonia -S/p 324 mg of aspirin, continue 81 mg aspirin daily -Continue heparin drip for 48 hours (started 2200 on 2/20)  -Continue metoprolol tartrate 25 mg twice daily -As needed nitroglycerin paste, other as needed pain meds -Echocardiogram complete -We will plan for further ischemic work-up with cardiac catheterization at 12:30 PM today with Dr. Clayborn Bigness. Procedure, risks, benefits and alternatives discussed with patient and she is agreeable to proceed.  This patient's plan of care was discussed and created with Dr. Lujean Amel and he is in agreement.  Signed: Tristan Schroeder , PA-C 02/25/2021, 7:47 AM [facility]

## 2021-02-25 NOTE — Progress Notes (Signed)
PROGRESS NOTE    Kaylee Romero  PXT:[id number] DOB: [birth year] DOA: 02/23/2021 PCP: Dion Body, MD   Brief Narrative:  41 year old female with metastatic stage IV breast cancer on Xeloda and palliative radiation underwent recent laparoscopic bilateral salpingo-oophorectomy with IUD removal on 2/13, HTN, ADHD, anxiety comes to the hospital with shortness of breath and chest discomfort.  Patient was diagnosed with left lower lobe pneumonia outpatient last week.  She was started on Levaquin which she took for 2 days but her symptoms persisted therefore came to the hospital.  Upon admission she was noted to be in sepsis secondary to left lower lobe pneumonia.  Chest pain was likely secondary to her cancer and pneumonia pain.  She was started on vancomycin and cefepime and later vancomycin was discontinued because MRSA screen was negative.  She required fluid resuscitation for hypotension.  Hospital course was complicated by NSTEMI, cardiology was consulted.  Plans for echo and LHC today.   Assessment & Plan:  Principal Problem:   Pneumonia Active Problems:   NSTEMI (non-ST elevated myocardial infarction) ([place])   Acute dyspnea   ADD (attention deficit disorder)   Malignant neoplasm of upper-outer quadrant of right breast in female, estrogen receptor positive ([place])   Sepsis ([place])   Essential hypertension   GAD (generalized anxiety disorder)   S/P bilateral salpingo-oophorectomy 02/17/21   Cancer-related pain   Chest pain     Assessment and Plan: * Pneumonia- (present on admission) Left lower lobe.  Worsening symptoms in spite of outpatient Levaquin x2 days.  Now she is on cefepime Bronchodilators scheduled as needed.   NSTEMI (non-ST elevated myocardial infarction) (HCC) Trops 108> 764 > 1824 > 1125 ASA 325mg , Nitro Ointment 1inch, Nitro SL prn, Heparin Drip x 48 hrs, Metoprolol 25mg  po BID, IV lopressor prn.  Echocardiogram this morning. LHC today Cardiology  consulted A1c-pending TSH 0.9.  Will check T4 LDL 117  Acute dyspnea CTA chest negative for PE Likely all related to pneumonia.  Chest x-ray showed LEFT LOWER lung consolidation/atelectasis and LEFT pleural thickening.  There is also progression of metastatic tumor within the mediastinum, left hilum and liver. IV antibiotics-vancomycin, cefepime.  MRSA screen negative therefore will DC vancomycin BNP-35 COVID/flu-negative UA-negative MRSA-negative    Chest pain NSTEMI as mentioned with combination of pneumonia, metastatic bone and pleuritic pain    Cancer-related pain Continue MS Contin, oxycodone IR.  Additionally IV Dilaudid, Toradol has been added Bowel regimen  S/P bilateral salpingo-oophorectomy 02/17/21 No acute issues suspected. This was recently perfermed on 02/17/21 so will need to monitor for any post op infection as well.    GAD (generalized anxiety disorder)- (present on admission) Continue venlafaxine and alprazolam  Essential hypertension- (present on admission) Continue to closely monitor.  Improved this morning  Sepsis ([place])- (present on admission) Fever appears to have subsided but still has borderline low blood pressure and mild tachycardia.  Lactic acidosis resolved.  WBC is improving this is secondary to left lower lobe pneumonia Fever could also be related to tumor burden Continue IV antibiotics       Malignant neoplasm of upper-outer quadrant of right breast in female, estrogen receptor positive (HCC) On Xeloda and palliative radiation therapy Seen by Dr. Grayland Ormond. Also notified Dr. Donella Stade from radiation oncology who will postpone her radiation for later in the week unless if she will be in the hospital for a few days then he will perform this inpatient  ADD (attention deficit disorder)- (present on admission) Continue Strattera and Intuniv  DVT prophylaxis:   Currently on heparin drip Code Status: Full code Family  Communication: Husband updated periodically  Status is: Inpatient Remains inpatient appropriate because: Having chest pain, undergoing LHC today.     Subjective: Reports her chest pain is better this morning.  Thinks combination of medications are helping her.  She is agreeable for ischemic evaluation today.    Examination:  Constitutional: Not in acute distress Respiratory: Diminished breath sounds at left lower lung Cardiovascular: Normal sinus rhythm, no rubs Abdomen: Nontender nondistended good bowel sounds Musculoskeletal: No edema noted Skin: No rashes seen Neurologic: CN 2-12 grossly intact.  And nonfocal Psychiatric: Normal judgment and insight. Alert and oriented x 3. Normal mood. Objective: Vitals:   02/25/21 0405 02/25/21 0639 02/25/21 0810 02/25/21 0847  BP:  131/74  113/63  Pulse: (!) 107 (!) 103  (!) 104  Resp:    18  Temp:  98.7 F (37.1 C)  98.1 F (36.7 C)  TempSrc:  Oral    SpO2: 94% 98% 96% 96%  Weight:      Height:        Intake/Output Summary (Last 24 hours) at 02/25/2021 1052 Last data filed at 02/25/2021 0400 Gross per 24 hour  Intake 2041.06 ml  Output --  Net 2041.06 ml   Filed Weights   02/23/21 2020  Weight: 76.9 kg     Data Reviewed:   CBC: Recent Labs  Lab 02/23/21 2019 02/24/21 0523 02/25/21 0427  WBC 14.4* 12.4* 10.9*  NEUTROABS 12.6*  --   --   HGB 14.1 11.9* 9.9*  HCT 41.5 36.3 29.0*  MCV 97.6 100.6* 99.0  PLT 401* 313 240   Basic Metabolic Panel: Recent Labs  Lab 02/23/21 2019 02/25/21 0427  NA 133* 137  K 3.8 4.1  CL 94* 104  CO2 28 27  GLUCOSE 136* 116*  BUN 14 9  CREATININE 0.69 0.57  CALCIUM 9.3 8.1*  MG  --  2.1   GFR: Estimated Creatinine Clearance: 90 mL/min (by C-G formula based on SCr of 0.57 mg/dL). Liver Function Tests: Recent Labs  Lab 02/23/21 2019 02/25/21 0427  AST 35 27  ALT 18 15  ALKPHOS 78 57  BILITOT 0.5 0.5  PROT 7.7 6.0*  ALBUMIN 3.9 3.0*   No results for input(s):  LIPASE, AMYLASE in the last 168 hours. No results for input(s): AMMONIA in the last 168 hours. Coagulation Profile: No results for input(s): INR, PROTIME in the last 168 hours. Cardiac Enzymes: No results for input(s): CKTOTAL, CKMB, CKMBINDEX, TROPONINI in the last 168 hours. BNP (last 3 results) No results for input(s): PROBNP in the last 8760 hours. HbA1C: No results for input(s): HGBA1C in the last 72 hours. CBG: No results for input(s): GLUCAP in the last 168 hours. Lipid Profile: Recent Labs    02/25/21 0427  CHOL 106  HDL 30*  LDLCALC 53  TRIG 117  CHOLHDL 3.5   Thyroid Function Tests: Recent Labs    02/25/21 0427  TSH 0.290*   Anemia Panel: No results for input(s): VITAMINB12, FOLATE, FERRITIN, TIBC, IRON, RETICCTPCT in the last 72 hours. Sepsis Labs: Recent Labs  Lab 02/23/21 2019 02/23/21 2221  LATICACIDVEN 2.1* 1.0    Recent Results (from the past 240 hour(s))  Resp Panel by RT-PCR (Flu A&B, Covid) Nasopharyngeal Swab     Status: None   Collection Time: 02/23/21  8:19 PM   Specimen: Nasopharyngeal Swab; Nasopharyngeal(NP) swabs in vial transport medium  Result Value Ref Range Status  SARS Coronavirus 2 by RT PCR NEGATIVE NEGATIVE Final    Comment: (NOTE) SARS-CoV-2 target nucleic acids are NOT DETECTED.  The SARS-CoV-2 RNA is generally detectable in upper respiratory specimens during the acute phase of infection. The lowest concentration of SARS-CoV-2 viral copies this assay can detect is 138 copies/mL. A negative result does not preclude SARS-Cov-2 infection and should not be used as the sole basis for treatment or other patient management decisions. A negative result may occur with  improper specimen collection/handling, submission of specimen other than nasopharyngeal swab, presence of viral mutation(s) within the areas targeted by this assay, and inadequate number of viral copies(<138 copies/mL). A negative result must be combined with clinical  observations, patient history, and epidemiological information. The expected result is Negative.  Fact Sheet for Patients:  EntrepreneurPulse.com.au  Fact Sheet for Healthcare Providers:  IncredibleEmployment.be  This test is no t yet approved or cleared by the [place] FDA and  has been authorized for detection and/or diagnosis of SARS-CoV-2 by FDA under an Emergency Use Authorization (EUA). This EUA will remain  in effect (meaning this test can be used) for the duration of the COVID-19 declaration under Section 564(b)(1) of the Act, 21 U.S.C.section 360bbb-3(b)(1), unless the authorization is terminated  or revoked sooner.       Influenza A by PCR NEGATIVE NEGATIVE Final   Influenza B by PCR NEGATIVE NEGATIVE Final    Comment: (NOTE) The Xpert Xpress SARS-CoV-2/FLU/RSV plus assay is intended as an aid in the diagnosis of influenza from Nasopharyngeal swab specimens and should not be used as a sole basis for treatment. Nasal washings and aspirates are unacceptable for Xpert Xpress SARS-CoV-2/FLU/RSV testing.  Fact Sheet for Patients: EntrepreneurPulse.com.au  Fact Sheet for Healthcare Providers: IncredibleEmployment.be  This test is not yet approved or cleared by the [place] FDA and has been authorized for detection and/or diagnosis of SARS-CoV-2 by FDA under an Emergency Use Authorization (EUA). This EUA will remain in effect (meaning this test can be used) for the duration of the COVID-19 declaration under Section 564(b)(1) of the Act, 21 U.S.C. section 360bbb-3(b)(1), unless the authorization is terminated or revoked.  Performed at [facility], [address] [place], [place] [address]   Culture, blood (routine x 2)     Status: None (Preliminary result)   Collection Time: 02/23/21  8:19 PM   Specimen: BLOOD  Result Value Ref Range Status   Specimen Description BLOOD LEFT  ANTECUBITAL  Final   Special Requests   Final    BOTTLES DRAWN AEROBIC AND ANAEROBIC Blood Culture adequate volume   Culture   Final    NO GROWTH 2 DAYS Performed at [facility], [address] [place], [place] [address]    Report Status PENDING  Incomplete  Culture, blood (routine x 2)     Status: None (Preliminary result)   Collection Time: 02/23/21  8:19 PM   Specimen: BLOOD  Result Value Ref Range Status   Specimen Description BLOOD BLOOD LEFT ARM  Final   Special Requests   Final    BOTTLES DRAWN AEROBIC AND ANAEROBIC Blood Culture adequate volume   Culture   Final    NO GROWTH 2 DAYS Performed at [facility], [address], [place], [place] [address]    Report Status PENDING  Incomplete  MRSA Next Gen by PCR, Nasal     Status: None   Collection Time: 02/23/21 10:07 PM   Specimen: Nasal Mucosa; Nasal Swab  Result Value  Ref Range Status   MRSA by PCR Next Gen NOT DETECTED NOT DETECTED Final    Comment: (NOTE) The GeneXpert MRSA Assay (FDA approved for NASAL specimens only), is one component of a comprehensive MRSA colonization surveillance program. It is not intended to diagnose MRSA infection nor to guide or monitor treatment for MRSA infections. Test performance is not FDA approved in patients less than 74 years old. Performed at [facility], [address], [place], [place] [address]          Radiology Studies: CT Angio Chest PE W and/or Wo Contrast  Addendum Date: 02/24/2021   ADDENDUM REPORT: 02/24/2021 07:47 ADDENDUM: As stated in the original dictation, left-sided pleural metastasis, widespread pulmonary metastases and hepatic metastases all appear increased compared to prior examinations. In addition, there is consolidative airspace disease in the left lower lobe, concerning for pneumonia. Electronically Signed   By: Vinnie Langton M.D.   On: 02/24/2021 07:47   Result Date: 02/24/2021 CLINICAL DATA:  Pulmonary embolism  suspected. High probability. Metastatic breast cancer. Shortness of breath. EXAM: CT ANGIOGRAPHY CHEST WITH CONTRAST TECHNIQUE: Multidetector CT imaging of the chest was performed using the standard protocol during bolus administration of intravenous contrast. Multiplanar CT image reconstructions and MIPs were obtained to evaluate the vascular anatomy. RADIATION DOSE REDUCTION: This exam was performed according to the departmental dose-optimization program which includes automated exposure control, adjustment of the mA and/or kV according to patient size and/or use of iterative reconstruction technique. CONTRAST:  165mL OMNIPAQUE IOHEXOL 350 MG/ML SOLN COMPARISON:  Chest radiography same day.  Chest CT 01/27/2021. FINDINGS: Cardiovascular: Cardiomegaly with left ventricular prominence. No visible aortic atherosclerotic calcification. Pulmonary arterial opacification is good. There are no pulmonary emboli. Mediastinum/Nodes: Over the last month, there has been considerable growth and mediastinal tumor. Anterior mediastinal disease previously measured at 15 and 17 mm on axial image 32 now measures 17 and 20 mm. Increased tumor growth in the subcarinal region and left hilum and along the left pericardial margin. Lungs/Pleura: Marked worsening of pulmonary metastatic disease. Innumerable nodules scattered throughout the lungs measuring 9 mm in size and smaller. There is worsening collapse of the left lower lobe. Lobular pleural disease on the left has worsened. Upper Abdomen: Worsening of hepatic metastatic disease. More numerous and larger metastatic lesions. For example, metastasis at the dome of the liver on the right previously measured 18 mm in diameter and now measures 21 mm in diameter. Musculoskeletal: Lytic sclerotic metastatic disease throughout the spine and ribs appears similar. Review of the MIP images confirms the above findings. IMPRESSION: No pulmonary emboli. Progression of metastatic tumor within the  mediastinum, left hilum, manifest as nodular metastatic lesions throughout both lungs, and within the liver. Worsened volume loss in the left lower lobe with progression of pleural disease. This interpretation is rendered on an emergent basis in order to rule out pulmonary emboli. The exam will be reviewed and addended by body/oncologic specialist radiologist tomorrow. Electronically Signed: By: Nelson Chimes M.D. On: 02/23/2021 21:42   DG Chest Port 1 View  Result Date: 02/24/2021 CLINICAL DATA:  Shortness of breath, chest pain EXAM: PORTABLE CHEST 1 VIEW COMPARISON:  Previous studies including the examination of 02/23/2021 FINDINGS: Transverse diameter of heart is increased. There is opacification of left lower lung fields consistent with pleural effusion and possibly underlying infiltrate with no significant change. Are possible small nodular densities in the right mid and right lower lung fields. Right lateral CP angle is clear. There is no pneumothorax.  IMPRESSION: No significant interval changes are noted in the opacification of left lower lung fields suggesting pleural effusion and possibly underlying infiltrate. Electronically Signed   By: Elmer Picker M.D.   On: 02/24/2021 15:06   DG Chest Portable 1 View  Result Date: 02/23/2021 CLINICAL DATA:  Chest pain and shortness of breath. History of metastatic breast cancer. EXAM: PORTABLE CHEST 1 VIEW COMPARISON:  02/21/2021 and prior radiograph FINDINGS: The cardiomediastinal silhouette is unchanged. LEFT LOWER lung consolidation/atelectasis and LEFT pleural thickening again noted. No new pulmonary findings are noted. There is no evidence of pneumothorax or acute bony abnormality. IMPRESSION: Unchanged appearance of the chest with LEFT LOWER lung consolidation/atelectasis and LEFT pleural thickening. Electronically Signed   By: Margarette Canada M.D.   On: 02/23/2021 20:42        Scheduled Meds:  aspirin EC  81 mg Oral Daily   atomoxetine  40 mg  Oral Daily   guanFACINE  2 mg Oral Daily   ipratropium-albuterol  3 mL Nebulization TID   lidocaine  2 patch Transdermal Q24H   metoprolol tartrate  25 mg Oral BID   morphine  30 mg Oral Q8H   nitroGLYCERIN  1 inch Topical Q6H   sodium chloride flush  10-40 mL Intracatheter Q12H   sodium chloride flush  3 mL Intravenous Q12H   venlafaxine XR  150 mg Oral QHS   Continuous Infusions:  sodium chloride     [START ON 02/26/2021] sodium chloride     ceFEPime (MAXIPIME) IV 2 g (02/25/21 0634)   heparin 1,050 Units/hr (02/25/21 0641)     LOS: 2 days   Time spent= 35 mins    Xsavier Seeley Arsenio Loader, MD [facility]  If 7PM-7AM, please contact night-coverage  02/25/2021, 10:52 AM

## 2021-02-25 NOTE — Progress Notes (Signed)

## 2021-02-25 NOTE — Consult Note (Addendum)
ANTICOAGULATION CONSULT NOTE  Pharmacy Consult for Heparin Indication: chest pain/ACS  Allergies  Allergen Reactions   Betadine [Povidone-Iodine] Rash    Patient Measurements: Height: 5\' 3"  (160 cm) Weight: 76.9 kg (169 lb 8.5 oz) IBW/kg (Calculated) : 52.4 Heparin Dosing Weight: 68.9 kg  Vital Signs: Temp: 98.4 F (36.9 C) (02/21 0217) Temp Source: Oral (02/21 0217) BP: 104/90 (02/21 0217) Pulse Rate: 107 (02/21 0405)  Labs: Recent Labs    02/23/21 2019 02/23/21 2221 02/24/21 0523 02/24/21 1504 02/24/21 1702 02/24/21 2024 02/25/21 0002 02/25/21 0427  HGB 14.1  --  11.9*  --   --   --   --  9.9*  HCT 41.5  --  36.3  --   --   --   --  29.0*  PLT 401*  --  313  --   --   --   --  282  APTT  --   --   --   --   --  24  --   --   CREATININE 0.69  --   --   --   --   --   --  0.57  TROPONINIHS 5   < >  --    < > 764* 1,824* 1,125*  --    < > = values in this interval not displayed.     Estimated Creatinine Clearance: 90 mL/min (by C-G formula based on SCr of 0.57 mg/dL).   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   Malignant neoplasm of upper-outer quadrant of right breast in female, estrogen receptor positive [facility])    oncologist--- dr Grayland Ormond;;  first dx 09/ 2020 s/p right lumpectomy w/ node dissection and completed chemoradiation 08/ 2021;;  recurrent 07/ 2022 to lung pleura,liver, and thoracic spine   SOB (shortness of breath)    02-12-2021  pt denies with regular activity but sob with stairs    Medications:  Medications Prior to Admission  Medication Sig Dispense Refill Last Dose    albuterol (VENTOLIN HFA) 108 (90 Base) MCG/ACT inhaler INHALE 2 PUFFS INTO THE LUNGS EVERY 6 HOURS AS NEEDED FOR WHEEZING OR SHORTNESS OF BREATH 6.7 g 2 02/23/2021   ALPRAZolam (XANAX) 0.25 MG tablet Take 1 tablet (0.25 mg total) by mouth 2 (two) times daily as needed for anxiety. 60 tablet 0 02/23/2021   Calcium Citrate-Vitamin D (CALCIUM + D PO) Take 1 tablet by mouth 3 (three) times daily.   02/23/2021   capecitabine (XELODA) 500 MG tablet Take 4 tablets (2,000 mg total) by mouth 2 (two) times daily after a meal. Take for 14 days, then hold for 7 days. Repeat every 21 days. 112 tablet 1 Past Week   Cholecalciferol (VITAMIN D3) 50 MCG (2000 UT) TABS Take 1 tablet by mouth daily at 12 noon.   02/23/2021   levofloxacin (LEVAQUIN) 500 MG tablet Take 1 tablet (500 mg total) by mouth daily for 7 days. 7 tablet 0 02/23/2021   morphine (MS CONTIN) 15 MG 12 hr tablet Take 2 tablets (30 mg total) by mouth every  8 (eight) hours. 90 tablet 0 02/23/2021 at 1700   oxyCODONE (OXY IR/ROXICODONE) 5 MG immediate release tablet Take 1 tablet (5 mg total) by mouth every 6 (six) hours as needed for severe pain. 90 tablet 0 02/23/2021 at 1500   promethazine (PHENERGAN) 25 MG tablet Take 1 tablet (25 mg total) by mouth every 6 (six) hours as needed for nausea or vomiting. 30 tablet 2 02/23/2021   acetaminophen (TYLENOL) 500 MG tablet Take 1,000 mg by mouth every 6 (six) hours as needed.   prn at unknown   Ascorbic Acid (VITAMIN C) 500 MG CAPS Take 500 mg by mouth daily. (Patient not taking: Reported on 02/12/2021)      atomoxetine (STRATTERA) 40 MG capsule Take 40 mg by mouth daily.   prn at unknown   chlorpheniramine-HYDROcodone (TUSSIONEX) 10-8 MG/5ML SUER Take 5 mLs by mouth every 12 (twelve) hours as needed for cough. (Patient not taking: Reported on 02/12/2021) 140 mL 0    docusate sodium (COLACE) 100 MG capsule Take 100 mg by mouth 2 (two) times daily as needed for mild constipation.   prn at unknown   guanFACINE (INTUNIV) 2 MG  TB24 ER tablet Take 2 mg by mouth daily.   prn at unknown   ibuprofen (ADVIL) 200 MG tablet Take 400 mg by mouth every 6 (six) hours as needed for mild pain.   prn at unknown   Multiple Vitamin (MULTIVITAMIN WITH MINERALS) TABS tablet Take 1 tablet by mouth daily. (Patient not taking: Reported on 02/12/2021)      ondansetron (ZOFRAN) 8 MG tablet Take 1 tablet (8 mg total) by mouth every 8 (eight) hours as needed for nausea or vomiting. 30 tablet 1 prn at unknown   palbociclib (IBRANCE) 125 MG tablet Take 125 mg by mouth daily. Take for 21 days on, 7 days off, repeat every 28 days. (Patient not taking: Reported on 02/24/2021)   Not Taking   polyethylene glycol (MIRALAX / GLYCOLAX) 17 g packet Take 17 g by mouth daily. (Patient taking differently: Take 17 g by mouth daily as needed for mild constipation.) 14 each 0 prn at unknown   Tetrahydrozoline HCl (VISINE OP) Place 1 drop into both eyes daily as needed (redness).   prn at unknown   venlafaxine XR (EFFEXOR-XR) 150 MG 24 hr capsule TAKE 1 CAPSULE(150 MG) BY MOUTH DAILY WITH BREAKFAST (Patient taking differently: 150 mg at bedtime.) 30 capsule 1 02/22/2021 at 2000   vitamin B-12 (CYANOCOBALAMIN) 1000 MCG tablet Take 1,000 mcg by mouth daily. (Patient not taking: Reported on 02/12/2021)      Scheduled:   aspirin EC  81 mg Oral Daily   atomoxetine  40 mg Oral Daily   guanFACINE  2 mg Oral Daily   ipratropium-albuterol  3 mL Nebulization TID   lidocaine  2 patch Transdermal Q24H   metoprolol tartrate  25 mg Oral BID   morphine  30 mg Oral Q8H   nitroGLYCERIN  1 inch Topical Q6H   sodium chloride flush  10-40 mL Intracatheter Q12H   venlafaxine XR  150 mg Oral QHS   Infusions:   ceFEPime (MAXIPIME) IV 2 g (02/24/21 2158)   heparin 800 Units/hr (02/24/21 2201)   PRN: acetaminophen **OR** acetaminophen, ALPRAZolam, docusate sodium, guaiFENesin, hydrALAZINE, HYDROmorphone (DILAUDID) injection, ipratropium-albuterol, ketorolac, metoprolol tartrate,  nitroGLYCERIN, ondansetron **OR** ondansetron (ZOFRAN) IV, oxyCODONE, oxyCODONE, polyethylene glycol, promethazine, sodium chloride flush, traZODone  Assessment: Pharmacy consulted to start heparin for ACS. Trop elevated at 764. No DOAC PTA noted.  Received enoxaparin 2/20 @ midnight.   Goal of Therapy:  Heparin level 0.3-0.7 units/ml Monitor platelets by anticoagulation protocol: Yes  2/21 0427 HL < 0.1, subtherapeutic   Plan:  Bolus 2000 units x 1 Increase heparin infusion to 1050 units/hr Recheck HL in 6 hrs after rate change CBC daily while on heparin  Renda Rolls, PharmD, [facility] 02/25/2021 5:39 AM

## 2021-02-26 ENCOUNTER — Encounter: Payer: Self-pay | Admitting: Internal Medicine

## 2021-02-26 ENCOUNTER — Encounter: Payer: Self-pay | Admitting: Oncology

## 2021-02-26 DIAGNOSIS — J189 Pneumonia, unspecified organism: Secondary | ICD-10-CM | POA: Diagnosis not present

## 2021-02-26 LAB — COMPREHENSIVE METABOLIC PANEL
ALT: 15 U/L (ref 0–44)
AST: 24 U/L (ref 15–41)
Albumin: 2.9 g/dL — ABNORMAL LOW (ref 3.5–5.0)
Alkaline Phosphatase: 58 U/L (ref 38–126)
Anion gap: 7 (ref 5–15)
BUN: 11 mg/dL (ref 6–20)
CO2: 28 mmol/L (ref 22–32)
Calcium: 8.2 mg/dL — ABNORMAL LOW (ref 8.9–10.3)
Chloride: 103 mmol/L (ref 98–111)
Creatinine, Ser: 0.51 mg/dL (ref 0.44–1.00)
GFR, Estimated: >60 mL/min (ref >60)
Glucose, Bld: 96 mg/dL (ref 70–99)
Potassium: 4.2 mmol/L (ref 3.5–5.1)
Sodium: 138 mmol/L (ref 135–145)
Total Bilirubin: 0.4 mg/dL (ref 0.3–1.2)
Total Protein: 5.8 g/dL — ABNORMAL LOW (ref 6.5–8.1)

## 2021-02-26 LAB — HEMOGLOBIN A1C
Hgb A1c MFr Bld: 5 % (ref 4.8–5.6)
Mean Plasma Glucose: 97 mg/dL

## 2021-02-26 LAB — MAGNESIUM: Magnesium: 2 mg/dL (ref 1.7–2.4)

## 2021-02-26 MED ORDER — METOPROLOL TARTRATE 25 MG PO TABS
25.0000 mg | ORAL_TABLET | Freq: Two times a day (BID) | ORAL | 1 refills | Status: AC
Start: 2021-02-26 — End: ?

## 2021-02-26 MED ORDER — IPRATROPIUM-ALBUTEROL 0.5-2.5 (3) MG/3ML IN SOLN: 3.0000 mL | RESPIRATORY_TRACT | 0 refills | Status: AC | PRN

## 2021-02-26 MED ORDER — GUAIFENESIN 100 MG/5ML PO LIQD: 5.0000 mL | ORAL | 0 refills | Status: DC | PRN

## 2021-02-26 MED ORDER — LEVOFLOXACIN 750 MG PO TABS: 750.0000 mg | ORAL_TABLET | Freq: Every day | ORAL | 0 refills | Status: AC

## 2021-02-26 NOTE — Telephone Encounter (Signed)
Looks like patient is in hospital.NSTEMI, Pneumonia, Sepsis  CBC Order: [id number] Status: Final result    Visible to patient: Yes (seen)    Next appt: 02/27/2021 at 10:15 AM in Oncology ([facility])    0 Result Notes           Component Ref Range & Units 1 d ago (02/25/21) 2 d ago (02/24/21) 3 d ago (02/23/21) 13 d ago (02/13/21) 3 wk ago (01/30/21) 1 mo ago (01/02/21) 2 mo ago (12/14/20)  WBC 4.0 - 10.5 K/uL 10.9 High   12.4 High   14.4 High   6.8  3.9 Low   4.1  5.9   RBC 3.87 - 5.11 MIL/uL 2.93 Low   3.61 Low   4.25  3.84 Low   3.74 Low   3.77 Low   3.69 Low    Hemoglobin 12.0 - 15.0 g/dL 9.9 Low   11.9 Low   14.1  12.9  13.1  13.1  12.5   HCT 36.0 - 46.0 % 29.0 Low   36.3  41.5  37.8  37.4  37.8  36.5   MCV 80.0 - 100.0 fL 99.0  100.6 High   97.6  98.4  100.0  100.3 High   98.9   MCH 26.0 - 34.0 pg 33.8  33.0  33.2  33.6  35.0 High   34.7 High   33.9   MCHC 30.0 - 36.0 g/dL 34.1  32.8  34.0  34.1  35.0  34.7  34.2   RDW 11.5 - 15.5 % 13.1  12.8  12.7  11.7  12.3  13.4  14.1   Platelets 150 - 400 K/uL 282  313  401 High   401 High   257  313  490 High    nRBC 0.0 - 0.2 % 0.0  0.0 CM  0.0  0.0 CM  0.0  0.0  0.0   Comment: Performed at [facility], [place], [place] [address]  Neutrophils Relative %    87 R   55 R  53 R  69 R   Basophils Absolute    0.0 R   0.1 R  0.1 R  0.1 R   Immature Granulocytes    1 R   0 R  0 R  0 R   Abs Immature Granulocytes    0.08 High  R, CM   0.01 R, CM  0.01 R, CM  0.02 R, CM   Neutro Abs    12.6 High  R   2.1 R  2.2 R  4.1 R   Lymphocytes Relative    5 R   29 R  29 R  20 R   Lymphs Abs    0.8 R   1.1 R  1.2 R  1.2 R   Monocytes Relative    6 R   13 R  14 R  6 R   Monocytes Absolute    0.8 R   0.5 R  0.6 R  0.4 R   Eosinophils Relative    1 R   2 R  2 R  3 R   Eosinophils Absolute    0.1 R   0.1 R  0.1 R  0.2 R   Basophils Relative    0 R   1 R  2 R  2 R   Resulting Agency  [place] [facility] [place] [facility] [place] [facility] [place] [facility] [place]  [facility]  [facility] [place] [facility]         Specimen Collected: 02/25/21 04:27 Last Resulted: 02/25/21 04:48      Comprehensive metabolic panel Order: [id number] Status: Final result    Visible to patient: No (scheduled for 02/26/2021  7:44 AM)    Next appt: 02/27/2021 at 10:15 AM in Oncology ([facility])    0 Result Notes           Component Ref Range & Units 05:32 1 d ago 3 d ago 13 d ago 3 wk ago 1 mo ago 2 mo ago  Sodium 135 - 145 mmol/L 138  137  133 Low   134 Low   137  137  136   Potassium 3.5 - 5.1 mmol/L 4.2  4.1  3.8  4.4  4.4  3.8  4.2   Chloride 98 - 111 mmol/L 103  104  94 Low   97 Low   97 Low   98  100   CO2 22 - 32 mmol/L 28  27  28  30  29  29  29    Glucose, Bld 70 - 99 mg/dL 96  116 High  CM  136 High  CM  103 High  CM  100 High  CM  142 High  CM  104 High  CM   Comment: Glucose reference range applies only to samples taken after fasting for at least 8 hours.  BUN 6 - 20 mg/dL 11  9  14  15  15  13  15    Creatinine, Ser 0.44 - 1.00 mg/dL 0.51  0.57  0.69  0.63  0.55  0.57  0.59   Calcium 8.9 - 10.3 mg/dL 8.2 Low   8.1 Low   9.3  9.4  9.5  9.1  9.2   Total Protein 6.5 - 8.1 g/dL 5.8 Low   6.0 Low   7.7  7.5  7.9  7.6    Albumin 3.5 - 5.0 g/dL 2.9 Low   3.0 Low   3.9  3.9  4.2  4.0    AST 15 - 41 U/L 24  27  35  35  31  25    ALT 0 - 44 U/L 15  15  18  30  28  25     Alkaline Phosphatase 38 - 126 U/L 58  57  78  66  68  74    Total Bilirubin 0.3 - 1.2 mg/dL 0.4  0.5  0.5  0.2 Low   0.3  0.3    GFR, Estimated >60 mL/min >60  >60 CM  >60 CM  >60 CM  >60 CM  >60 CM  >60 CM   Comment: (NOTE)  Calculated using the CKD-EPI Creatinine Equation (2021)   Anion gap 5 - 15 7  6  CM  11 CM  7 CM  11 CM  10 CM  7 CM   Comment: Performed at [facility], [address] [place], [place] [address]  Resulting Agency  [facility] [place] [facility] [place] [facility] [place] [facility] [place] [facility] [place] [facility] [place] [facility]         Specimen Collected: 02/26/21 05:32 Last Resulted: 02/26/21 06:44

## 2021-02-26 NOTE — Discharge Instructions (Signed)
Advised to folllow up PCP in one week. Advised to take levaquin 750mg  daily for 2 days

## 2021-02-26 NOTE — Discharge Summary (Signed)
Physician Discharge Summary   Patient: Kaylee Romero MRN: [id number] DOB: March 14, 1980  Admit date:     02/23/2021  Discharge date: 02/26/21  Discharge Physician: Shawna Clamp   PCP: Dion Body, MD   Recommendations at discharge:  Advised to folllow up PCP in one week. Advised to take levaquin 750mg  daily for 2 days to complete treatment for pneumonia. Please check CBC CMP in 1 week.  Discharge Diagnoses: Principal Problem:   Pneumonia Active Problems:   ADD (attention deficit disorder)   Malignant neoplasm of upper-outer quadrant of right breast in female, estrogen receptor positive ([place])   Sepsis ([place])   Essential hypertension   GAD (generalized anxiety disorder)   S/P bilateral salpingo-oophorectomy 02/17/21   Cancer-related pain   Acute dyspnea   Chest pain   NSTEMI (non-ST elevated myocardial infarction) ([place])  Resolved Problems:   * No resolved hospital problems. [facility] Course: This 41 year old female with metastatic stage IV breast cancer on Xeloda and palliative radiation,  underwent recent laparoscopic bilateral salpingo-oophorectomy with IUD removal on 2/13, HTN, ADHD, anxiety disorder presented  to the hospital with shortness of breath and chest discomfort. Patient was diagnosed with left lower lobe pneumonia , outpatient last week.  She was started on Levaquin which she took for 2 days but her symptoms persisted therefore came to the hospital.  Upon admission she was noted to be in sepsis secondary to left lower lobe pneumonia.  Chest pain was likely secondary to her cancer and pneumonia pain.  She was started on vancomycin and cefepime and later vancomycin was discontinued because MRSA screen was negative.  She required fluid resuscitation for hypotension.  Hospital course was complicated by NSTEMI, Cardiology was consulted.  She underwent left heart catheter which was unremarkable.  She has normal coronaries no obstruction.  LVEF normal.  Patient was  cleared from cardiology to be discharged.  Patient feels better, not hypoxic.  She wants to be discharged.  Patient is being discharged home on 2 more days of Levaquin to complete 7-day treatment for community-acquired pneumonia.  Assessment and Plan: * Pneumonia- (present on admission) She presented with worsening symptoms in spite of outpatient Levaquin for 2 days.  Here she was switched to cefepime. Bronchodilators scheduled as needed.   NSTEMI (non-ST elevated myocardial infarction) (HCC) Trops 108> 764 > 1824 > 1125  Continue ASA 325mg , Nitro Ointment 1inch, Nitro SL prn, Heparin Drip x 48 hrs, Metoprolol 25mg  po BID, IV lopressor prn.  Patient underwent left heart cath which came out completely normal,  no CAD.  Patient is cleared from cardiology to be discharged. LDL 117  Chest pain NSTEMI as mentioned with combination of pneumonia, metastatic bone and pleuritic pain    Acute dyspnea CTA chest negative for PE Likely all related to pneumonia.   Chest x-ray showed left lower lung consolidation/atelectasis and LEFT pleural thickening.  There is also progression of metastatic tumor within the mediastinum, left hilum and liver. Initiated on vancomycin and cefepime.  MRSA screen negative therefore will DC vancomycin BNP-35 COVID/flu-negative UA-negative MRSA-negative    Cancer-related pain Continue MS Contin, oxycodone IR.  Additionally IV Dilaudid, Toradol has been added Bowel regimen  S/P bilateral salpingo-oophorectomy 02/17/21 No acute issues suspected. This was recently performed on 02/17/21 so will need to monitor for any post op infection as well.    GAD (generalized anxiety disorder)- (present on admission) Continue venlafaxine and alprazolam  Essential hypertension- (present on admission) Continue to closely monitor.  Improved this morning  Sepsis ([place])- (present on admission) Fever appears to have subsided but still has borderline low blood pressure and mild  tachycardia.  Lactic acidosis resolved.  WBC is improving this is secondary to left lower lobe pneumonia Fever could also be related to tumor burden Continued on IV antibiotics for 3 days.  Now transition to oral Levaquin.  She feels better. Sepsis physiology resolved.       Malignant neoplasm of upper-outer quadrant of right breast in female, estrogen receptor positive ([place]) On Xeloda and palliative radiation therapy Follows with Dr. Grayland Ormond. Also notified Dr. Donella Stade from radiation oncology who will postpone her radiation for later in the week unless if she will be in the hospital for a few days then he will perform this inpatient  ADD (attention deficit disorder)- (present on admission) Continue Strattera and Intuniv   Consultants: Cardiology Procedures performed: Left Heart cath  Disposition: Home Diet recommendation:  Discharge Diet Orders (From admission, onward)     Start     Ordered   02/26/21 0000  Diet - low sodium heart healthy        02/26/21 1159   02/26/21 0000  Diet Carb Modified        02/26/21 1159           Cardiac diet  DISCHARGE MEDICATION: Allergies as of 02/26/2021       Reactions   Betadine [povidone-iodine] Rash        Medication List     STOP taking these medications    chlorpheniramine-HYDROcodone 10-8 MG/5ML Suer Commonly known as: TUSSIONEX   multivitamin with minerals Tabs tablet   palbociclib 125 MG tablet Commonly known as: IBRANCE   vitamin B-12 1000 MCG tablet Commonly known as: CYANOCOBALAMIN   Vitamin C 500 MG Caps       TAKE these medications    acetaminophen 500 MG tablet Commonly known as: TYLENOL Take 1,000 mg by mouth every 6 (six) hours as needed.   albuterol 108 (90 Base) MCG/ACT inhaler Commonly known as: VENTOLIN HFA INHALE 2 PUFFS INTO THE LUNGS EVERY 6 HOURS AS NEEDED FOR WHEEZING OR SHORTNESS OF BREATH   ALPRAZolam 0.25 MG tablet Commonly known as: XANAX Take 1 tablet (0.25 mg total) by  mouth 2 (two) times daily as needed for anxiety.   atomoxetine 40 MG capsule Commonly known as: STRATTERA Take 40 mg by mouth daily.   CALCIUM + D PO Take 1 tablet by mouth 3 (three) times daily.   capecitabine 500 MG tablet Commonly known as: XELODA TAKE 4 TABLETS BY MOUTH 2 TIMES DAILY AFTER A MEAL FOR 14 DAYS, THEN HOLD FOR 7 DAYS, REPEAT EVERY 21 DAYS. What changed: See the new instructions.   docusate sodium 100 MG capsule Commonly known as: COLACE Take 100 mg by mouth 2 (two) times daily as needed for mild constipation.   guaiFENesin 100 MG/5ML liquid Commonly known as: ROBITUSSIN Take 5 mLs by mouth every 4 (four) hours as needed for cough or to loosen phlegm.   guanFACINE 2 MG Tb24 ER tablet Commonly known as: INTUNIV Take 2 mg by mouth daily.   ibuprofen 200 MG tablet Commonly known as: ADVIL Take 400 mg by mouth every 6 (six) hours as needed for mild pain.   ipratropium-albuterol 0.5-2.5 (3) MG/3ML Soln Commonly known as: DUONEB Take 3 mLs by nebulization every 4 (four) hours as needed.   levofloxacin 750 MG tablet Commonly known as: Levaquin Take 1 tablet (750 mg total) by mouth daily for 2 days.  What changed:  medication strength how much to take   metoprolol tartrate 25 MG tablet Commonly known as: LOPRESSOR Take 1 tablet (25 mg total) by mouth 2 (two) times daily.   morphine 15 MG 12 hr tablet Commonly known as: MS CONTIN Take 2 tablets (30 mg total) by mouth every 8 (eight) hours.   ondansetron 8 MG tablet Commonly known as: ZOFRAN Take 1 tablet (8 mg total) by mouth every 8 (eight) hours as needed for nausea or vomiting.   oxyCODONE 5 MG immediate release tablet Commonly known as: Oxy IR/ROXICODONE Take 1 tablet (5 mg total) by mouth every 6 (six) hours as needed for severe pain.   polyethylene glycol 17 g packet Commonly known as: MIRALAX / GLYCOLAX Take 17 g by mouth daily. What changed:  when to take this reasons to take this    promethazine 25 MG tablet Commonly known as: PHENERGAN Take 1 tablet (25 mg total) by mouth every 6 (six) hours as needed for nausea or vomiting.   venlafaxine XR 150 MG 24 hr capsule Commonly known as: EFFEXOR-XR TAKE 1 CAPSULE(150 MG) BY MOUTH DAILY WITH BREAKFAST What changed: See the new instructions.   VISINE OP Place 1 drop into both eyes daily as needed (redness).   Vitamin D3 50 MCG (2000 UT) Tabs Take 1 tablet by mouth daily at 12 noon.               Durable Medical Equipment  (From admission, onward)           Start     Ordered   02/26/21 0000  For home use only DME Nebulizer machine       Question Answer Comment  Patient needs a nebulizer to treat with the following condition Shortness of breath   Length of Need Lifetime      02/26/21 1256              Discharge Care Instructions  (From admission, onward)           Start     Ordered   02/26/21 0000  Discharge wound care:       Comments: Follow up PCP in one week.   02/26/21 1159            Follow-up Information     Dion Body, MD Follow up on 03/06/2021.   Specialty: Family Medicine Why: @ 11:15am Contact information: [place] [address] [phone]                 Discharge Exam: Danley Danker Weights   02/23/21 2020 02/25/21 1251  Weight: 76.9 kg 74 kg   General exam: Appears comfortable, not in any acute distress. Respiratory system: CTA bilaterally, no wheezing, no crackles.  Respiratory effort normal Cardiovascular system: S1-S2 heard, regular rate and rhythm, no murmur. Gastrointestinal system: Abdominal soft, nontender, nondistended, BS+ Central nervous system: Alert oriented x3, no neurological deficits. Extremities: No edema, no cyanosis, no clubbing. Psychiatry: Mood, judgment, insight normal   Condition at discharge: stable  The results of significant diagnostics from this hospitalization (including  imaging, microbiology, ancillary and laboratory) are listed below for reference.   Imaging Studies: DG Chest 2 View  Result Date: 02/21/2021 CLINICAL DATA:  A 41 year old female presents with shortness of breath and history of breast cancer. EXAM: CHEST - 2 VIEW COMPARISON:  Chest x-ray from October 01, 2020 and CT of the chest from January 27, 2021. FINDINGS: Cardiomediastinal contours and hilar  structures are stable. LEFT hemidiaphragm remains obscured. Signs of LEFT pleural effusion and basilar airspace process. Subtle background nodularity has increased in the RIGHT chest over time. No additional areas of frank consolidative changes. On limited assessment there is no acute skeletal process. Evidence of prior RIGHT axillary dissection presumably projecting over the RIGHT chest. IMPRESSION: 1. Malignant effusion in the LEFT chest with further consolidation or enlarging nodules and masses in this area. Difficult to exclude developing infection in the LEFT lung base. 2. Scattered small nodular densities in the RIGHT chest appearing more conspicuous than on previous imaging may represent a combination of known metastatic disease and superimposed infection. Electronically Signed   By: Zetta Bills M.D.   On: 02/21/2021 13:22   CT Angio Chest PE W and/or Wo Contrast  Addendum Date: 02/24/2021   ADDENDUM REPORT: 02/24/2021 07:47 ADDENDUM: As stated in the original dictation, left-sided pleural metastasis, widespread pulmonary metastases and hepatic metastases all appear increased compared to prior examinations. In addition, there is consolidative airspace disease in the left lower lobe, concerning for pneumonia. Electronically Signed   By: Vinnie Langton M.D.   On: 02/24/2021 07:47   Result Date: 02/24/2021 CLINICAL DATA:  Pulmonary embolism suspected. High probability. Metastatic breast cancer. Shortness of breath. EXAM: CT ANGIOGRAPHY CHEST WITH CONTRAST TECHNIQUE: Multidetector CT imaging of the  chest was performed using the standard protocol during bolus administration of intravenous contrast. Multiplanar CT image reconstructions and MIPs were obtained to evaluate the vascular anatomy. RADIATION DOSE REDUCTION: This exam was performed according to the departmental dose-optimization program which includes automated exposure control, adjustment of the mA and/or kV according to patient size and/or use of iterative reconstruction technique. CONTRAST:  123mL OMNIPAQUE IOHEXOL 350 MG/ML SOLN COMPARISON:  Chest radiography same day.  Chest CT 01/27/2021. FINDINGS: Cardiovascular: Cardiomegaly with left ventricular prominence. No visible aortic atherosclerotic calcification. Pulmonary arterial opacification is good. There are no pulmonary emboli. Mediastinum/Nodes: Over the last month, there has been considerable growth and mediastinal tumor. Anterior mediastinal disease previously measured at 15 and 17 mm on axial image 32 now measures 17 and 20 mm. Increased tumor growth in the subcarinal region and left hilum and along the left pericardial margin. Lungs/Pleura: Marked worsening of pulmonary metastatic disease. Innumerable nodules scattered throughout the lungs measuring 9 mm in size and smaller. There is worsening collapse of the left lower lobe. Lobular pleural disease on the left has worsened. Upper Abdomen: Worsening of hepatic metastatic disease. More numerous and larger metastatic lesions. For example, metastasis at the dome of the liver on the right previously measured 18 mm in diameter and now measures 21 mm in diameter. Musculoskeletal: Lytic sclerotic metastatic disease throughout the spine and ribs appears similar. Review of the MIP images confirms the above findings. IMPRESSION: No pulmonary emboli. Progression of metastatic tumor within the mediastinum, left hilum, manifest as nodular metastatic lesions throughout both lungs, and within the liver. Worsened volume loss in the left lower lobe with  progression of pleural disease. This interpretation is rendered on an emergent basis in order to rule out pulmonary emboli. The exam will be reviewed and addended by body/oncologic specialist radiologist tomorrow. Electronically Signed: By: Nelson Chimes M.D. On: 02/23/2021 21:42   CARDIAC CATHETERIZATION  Result Date: 02/25/2021   The left ventricular systolic function is normal.   LV end diastolic pressure is normal.   The left ventricular ejection fraction is 55-65% by visual estimate. Conclusion Normal left ventricular function EF of 60% Normal coronaries Intervention deferred No  conclusion   DG Chest Port 1 View  Result Date: 02/24/2021 CLINICAL DATA:  Shortness of breath, chest pain EXAM: PORTABLE CHEST 1 VIEW COMPARISON:  Previous studies including the examination of 02/23/2021 FINDINGS: Transverse diameter of heart is increased. There is opacification of left lower lung fields consistent with pleural effusion and possibly underlying infiltrate with no significant change. Are possible small nodular densities in the right mid and right lower lung fields. Right lateral CP angle is clear. There is no pneumothorax. IMPRESSION: No significant interval changes are noted in the opacification of left lower lung fields suggesting pleural effusion and possibly underlying infiltrate. Electronically Signed   By: Elmer Picker M.D.   On: 02/24/2021 15:06   DG Chest Portable 1 View  Result Date: 02/23/2021 CLINICAL DATA:  Chest pain and shortness of breath. History of metastatic breast cancer. EXAM: PORTABLE CHEST 1 VIEW COMPARISON:  02/21/2021 and prior radiograph FINDINGS: The cardiomediastinal silhouette is unchanged. LEFT LOWER lung consolidation/atelectasis and LEFT pleural thickening again noted. No new pulmonary findings are noted. There is no evidence of pneumothorax or acute bony abnormality. IMPRESSION: Unchanged appearance of the chest with LEFT LOWER lung consolidation/atelectasis and LEFT  pleural thickening. Electronically Signed   By: Margarette Canada M.D.   On: 02/23/2021 20:42    Microbiology: Results for orders placed or performed during the hospital encounter of 02/23/21  Resp Panel by RT-PCR (Flu A&B, Covid) Nasopharyngeal Swab     Status: None   Collection Time: 02/23/21  8:19 PM   Specimen: Nasopharyngeal Swab; Nasopharyngeal(NP) swabs in vial transport medium  Result Value Ref Range Status   SARS Coronavirus 2 by RT PCR NEGATIVE NEGATIVE Final    Comment: (NOTE) SARS-CoV-2 target nucleic acids are NOT DETECTED.  The SARS-CoV-2 RNA is generally detectable in upper respiratory specimens during the acute phase of infection. The lowest concentration of SARS-CoV-2 viral copies this assay can detect is 138 copies/mL. A negative result does not preclude SARS-Cov-2 infection and should not be used as the sole basis for treatment or other patient management decisions. A negative result may occur with  improper specimen collection/handling, submission of specimen other than nasopharyngeal swab, presence of viral mutation(s) within the areas targeted by this assay, and inadequate number of viral copies(<138 copies/mL). A negative result must be combined with clinical observations, patient history, and epidemiological information. The expected result is Negative.  Fact Sheet for Patients:  EntrepreneurPulse.com.au  Fact Sheet for Healthcare Providers:  IncredibleEmployment.be  This test is no t yet approved or cleared by the [place] FDA and  has been authorized for detection and/or diagnosis of SARS-CoV-2 by FDA under an Emergency Use Authorization (EUA). This EUA will remain  in effect (meaning this test can be used) for the duration of the COVID-19 declaration under Section 564(b)(1) of the Act, 21 U.S.C.section 360bbb-3(b)(1), unless the authorization is terminated  or revoked sooner.       Influenza A by PCR NEGATIVE  NEGATIVE Final   Influenza B by PCR NEGATIVE NEGATIVE Final    Comment: (NOTE) The Xpert Xpress SARS-CoV-2/FLU/RSV plus assay is intended as an aid in the diagnosis of influenza from Nasopharyngeal swab specimens and should not be used as a sole basis for treatment. Nasal washings and aspirates are unacceptable for Xpert Xpress SARS-CoV-2/FLU/RSV testing.  Fact Sheet for Patients: EntrepreneurPulse.com.au  Fact Sheet for Healthcare Providers: IncredibleEmployment.be  This test is not yet approved or cleared by the [place] FDA and has been authorized for detection and/or diagnosis  of SARS-CoV-2 by FDA under an Emergency Use Authorization (EUA). This EUA will remain in effect (meaning this test can be used) for the duration of the COVID-19 declaration under Section 564(b)(1) of the Act, 21 U.S.C. section 360bbb-3(b)(1), unless the authorization is terminated or revoked.  Performed at [facility], [address] [place], [place] [address]   Culture, blood (routine x 2)     Status: None (Preliminary result)   Collection Time: 02/23/21  8:19 PM   Specimen: BLOOD  Result Value Ref Range Status   Specimen Description BLOOD LEFT ANTECUBITAL  Final   Special Requests   Final    BOTTLES DRAWN AEROBIC AND ANAEROBIC Blood Culture adequate volume   Culture   Final    NO GROWTH 3 DAYS Performed at [facility], [address], [place], [place] [address]    Report Status PENDING  Incomplete  Culture, blood (routine x 2)     Status: None (Preliminary result)   Collection Time: 02/23/21  8:19 PM   Specimen: BLOOD  Result Value Ref Range Status   Specimen Description BLOOD BLOOD LEFT ARM  Final   Special Requests   Final    BOTTLES DRAWN AEROBIC AND ANAEROBIC Blood Culture adequate volume   Culture   Final    NO GROWTH 3 DAYS Performed at [facility], [address], [place], [place] [address]    Report Status  PENDING  Incomplete  MRSA Next Gen by PCR, Nasal     Status: None   Collection Time: 02/23/21 10:07 PM   Specimen: Nasal Mucosa; Nasal Swab  Result Value Ref Range Status   MRSA by PCR Next Gen NOT DETECTED NOT DETECTED Final    Comment: (NOTE) The GeneXpert MRSA Assay (FDA approved for NASAL specimens only), is one component of a comprehensive MRSA colonization surveillance program. It is not intended to diagnose MRSA infection nor to guide or monitor treatment for MRSA infections. Test performance is not FDA approved in patients less than 24 years old. Performed at [facility], [address] [place], [place] [address]     Labs: CBC: Recent Labs  Lab 02/23/21 2019 02/24/21 0523 02/25/21 0427  WBC 14.4* 12.4* 10.9*  NEUTROABS 12.6*  --   --   HGB 14.1 11.9* 9.9*  HCT 41.5 36.3 29.0*  MCV 97.6 100.6* 99.0  PLT 401* 313 004   Basic Metabolic Panel: Recent Labs  Lab 02/23/21 2019 02/25/21 0427 02/26/21 0532  NA 133* 137 138  K 3.8 4.1 4.2  CL 94* 104 103  CO2 28 27 28   GLUCOSE 136* 116* 96  BUN 14 9 11   CREATININE 0.69 0.57 0.51  CALCIUM 9.3 8.1* 8.2*  MG  --  2.1 2.0   Liver Function Tests: Recent Labs  Lab 02/23/21 2019 02/25/21 0427 02/26/21 0532  AST 35 27 24  ALT 18 15 15   ALKPHOS 78 57 58  BILITOT 0.5 0.5 0.4  PROT 7.7 6.0* 5.8*  ALBUMIN 3.9 3.0* 2.9*   CBG: No results for input(s): GLUCAP in the last 168 hours.  Discharge time spent: greater than 30 minutes.  Signed: Shawna Clamp, MD [facility] 02/26/2021

## 2021-02-26 NOTE — Progress Notes (Signed)
Alert and oriented, vss, no complaints of pain.  D/c telemetry and piv.  Escorted out of hospital via wheelchair by volunteers.

## 2021-02-27 ENCOUNTER — Other Ambulatory Visit: Payer: Self-pay

## 2021-02-27 ENCOUNTER — Other Ambulatory Visit: Payer: Self-pay | Admitting: Emergency Medicine

## 2021-02-27 ENCOUNTER — Inpatient Hospital Stay: Payer: BC Managed Care – PPO | Admitting: Pharmacist

## 2021-02-27 ENCOUNTER — Ambulatory Visit
Admission: RE | Admit: 2021-02-27 | Discharge: 2021-02-27 | Disposition: A | Discharge status: Home / Self Care | Payer: BC Managed Care – PPO | Source: Ambulatory Visit | Attending: Radiation Oncology | Admitting: Radiation Oncology

## 2021-02-27 ENCOUNTER — Inpatient Hospital Stay (HOSPITAL_BASED_OUTPATIENT_CLINIC_OR_DEPARTMENT_OTHER): Payer: BC Managed Care – PPO | Admitting: Oncology

## 2021-02-27 ENCOUNTER — Inpatient Hospital Stay: Payer: BC Managed Care – PPO

## 2021-02-27 ENCOUNTER — Inpatient Hospital Stay (HOSPITAL_BASED_OUTPATIENT_CLINIC_OR_DEPARTMENT_OTHER): Payer: BC Managed Care – PPO | Admitting: Hospice and Palliative Medicine

## 2021-02-27 VITALS — BP 120/76 | HR 104 | Temp 98.9°F | Resp 18 | Wt 171.4 lb

## 2021-02-27 DIAGNOSIS — Z515 Encounter for palliative care: Secondary | ICD-10-CM | POA: Diagnosis not present

## 2021-02-27 DIAGNOSIS — C50411 Malignant neoplasm of upper-outer quadrant of right female breast: Secondary | ICD-10-CM

## 2021-02-27 DIAGNOSIS — C7951 Secondary malignant neoplasm of bone: Secondary | ICD-10-CM | POA: Insufficient documentation

## 2021-02-27 DIAGNOSIS — C50911 Malignant neoplasm of unspecified site of right female breast: Secondary | ICD-10-CM

## 2021-02-27 DIAGNOSIS — Z170 Estrogen receptor positive status [ER+]: Secondary | ICD-10-CM

## 2021-02-27 DIAGNOSIS — C782 Secondary malignant neoplasm of pleura: Secondary | ICD-10-CM | POA: Insufficient documentation

## 2021-02-27 DIAGNOSIS — G893 Neoplasm related pain (acute) (chronic): Secondary | ICD-10-CM | POA: Diagnosis not present

## 2021-02-27 DIAGNOSIS — Z510 Encounter for antineoplastic radiation therapy: Secondary | ICD-10-CM | POA: Diagnosis not present

## 2021-02-27 LAB — COMPREHENSIVE METABOLIC PANEL
ALT: 18 U/L (ref 0–44)
AST: 35 U/L (ref 15–41)
Albumin: 3.4 g/dL — ABNORMAL LOW (ref 3.5–5.0)
Alkaline Phosphatase: 77 U/L (ref 38–126)
Anion gap: 3 — ABNORMAL LOW (ref 5–15)
BUN: 9 mg/dL (ref 6–20)
CO2: 31 mmol/L (ref 22–32)
Calcium: 8.5 mg/dL — ABNORMAL LOW (ref 8.9–10.3)
Chloride: 99 mmol/L (ref 98–111)
Creatinine, Ser: 0.56 mg/dL (ref 0.44–1.00)
GFR, Estimated: >60 mL/min (ref >60)
Glucose, Bld: 96 mg/dL (ref 70–99)
Potassium: 4 mmol/L (ref 3.5–5.1)
Sodium: 133 mmol/L — ABNORMAL LOW (ref 135–145)
Total Bilirubin: 0.2 mg/dL — ABNORMAL LOW (ref 0.3–1.2)
Total Protein: 7.1 g/dL (ref 6.5–8.1)

## 2021-02-27 LAB — CBC WITH DIFFERENTIAL/PLATELET
Abs Immature Granulocytes: 0.05 10*3/uL (ref 0.00–0.07)
Basophils Absolute: 0 10*3/uL (ref 0.0–0.1)
Basophils Relative: 0 %
Eosinophils Absolute: 0.4 10*3/uL (ref 0.0–0.5)
Eosinophils Relative: 4 %
HCT: 32.7 % — ABNORMAL LOW (ref 36.0–46.0)
Hemoglobin: 11.2 g/dL — ABNORMAL LOW (ref 12.0–15.0)
Immature Granulocytes: 1 %
Lymphocytes Relative: 6 %
Lymphs Abs: 0.5 10*3/uL — ABNORMAL LOW (ref 0.7–4.0)
MCH: 34 pg (ref 26.0–34.0)
MCHC: 34.3 g/dL (ref 30.0–36.0)
MCV: 99.4 fL (ref 80.0–100.0)
Monocytes Absolute: 0.7 10*3/uL (ref 0.1–1.0)
Monocytes Relative: 8 %
Neutro Abs: 7.6 10*3/uL (ref 1.7–7.7)
Neutrophils Relative %: 81 %
Platelets: 303 10*3/uL (ref 150–400)
RBC: 3.29 MIL/uL — ABNORMAL LOW (ref 3.87–5.11)
RDW: 13 % (ref 11.5–15.5)
WBC: 9.3 10*3/uL (ref 4.0–10.5)
nRBC: 0 % (ref 0.0–0.2)

## 2021-02-27 LAB — VITAMIN D 25 HYDROXY (VIT D DEFICIENCY, FRACTURES): Vit D, 25-Hydroxy: 56.92 ng/mL (ref 30–100)

## 2021-02-27 MED ORDER — DENOSUMAB 120 MG/1.7ML ~~LOC~~ SOLN
120.0000 mg | Freq: Once | SUBCUTANEOUS | Status: AC
  Administered 2021-02-27: 120 mg via SUBCUTANEOUS
  Filled 2021-02-27: qty 1.7

## 2021-02-27 MED ORDER — OXYCODONE HCL 5 MG PO TABS: 5.0000 mg | ORAL_TABLET | Freq: Four times a day (QID) | ORAL | 0 refills | Status: DC | PRN

## 2021-02-27 MED ORDER — PROCHLORPERAZINE MALEATE 10 MG PO TABS: 10.0000 mg | ORAL_TABLET | Freq: Two times a day (BID) | ORAL | 2 refills | Status: DC

## 2021-02-27 MED ORDER — MELOXICAM 7.5 MG PO TABS: 7.5000 mg | ORAL_TABLET | Freq: Every day | ORAL | 0 refills | Status: DC

## 2021-02-27 NOTE — Progress Notes (Signed)
Pt d/c from hospital yesterday with PNA. Pt c/o continued SOB and new rx for metoprolol

## 2021-02-27 NOTE — Progress Notes (Signed)
[place]  [phone] [phone]  Patient Care Team: Dion Body, MD as PCP - General (Family Medicine) Gery Pray, MD as Consulting Physician (Radiation Oncology) Jovita Kussmaul, MD as Consulting Physician (General Surgery) Dillingham, Loel Lofty, DO as Attending Physician (Plastic Surgery) Lloyd Huger, MD as Consulting Physician (Oncology)   Name of the patient: Kaylee Romero  [id number]  05-Jan-1981   Date of visit: 02/27/21  HPI: Patient is a 41 y.o. female with progressive metastatic breast cancer, ER/PR positive, HER2 negative. Initially treated with Palbociclib, started on 08/15/20. CT on 01/27/21 showed progressive disease. Palbociclib was discontinued and patient started treatment with capecitabine the beginning of February 2023. Patient underwent TAH/BSO on 02/17/21. Additionally, patient was recently admitted for treatment of CAP and NSTEMI on 02/23/21.   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   Reason for Consult: Oral chemotherapy follow-up for capecitabine therapy.   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   Malignant neoplasm of upper-outer quadrant of right breast in female, estrogen receptor positive [facility])    oncologist--- dr Grayland Ormond;;  first dx 09/ 2020 s/p right lumpectomy w/ node dissection and completed chemoradiation 08/ 2021;;  recurrent 07/ 2022 to lung pleura,liver, and thoracic spine    SOB (shortness of breath)    02-12-2021  pt denies with regular activity but sob with stairs    HEMATOLOGY/ONCOLOGY HISTORY:  Oncology History  Malignant neoplasm of upper-outer quadrant of right breast in female, estrogen receptor positive ([place])  09/29/2018 Initial Diagnosis   Malignant neoplasm of upper-outer quadrant of right breast in female, estrogen receptor positive ([place])   09/2018 -  Anti-estrogen oral therapy   Tamoxifen; held from 12/05/2018-09/06/2019 for surgery   10/05/2018 Cancer Staging   Staging form: Breast, AJCC 8th Edition - Clinical stage from 10/05/2018: Stage IB (cT2, cN0, cM0, G2, ER+, PR+, HER2-) - Signed by Chauncey Cruel, MD on 04/02/2020 Stage prefix: Initial diagnosis Histologic grading system: 3 grade system    10/13/2018 Genetic Testing   Negative genetic testing. No pathogenic variants identified on the [facility] Breast Cancer STAT Panel + Common Hereditary Cancers Panel. The Common Hereditary Cancers Panel offered by [facility] includes sequencing and/or deletion duplication testing of the following 47 genes: APC, ATM, AXIN2, BARD1, BMPR1A, BRCA1, BRCA2, BRIP1, CDH1, CDKN2A (p14ARF), CDKN2A (p16INK4a), CKD4, CHEK2, CTNNA1, DICER1, EPCAM (Deletion/duplication testing only), GREM1 (promoter region deletion/duplication testing only), KIT, MEN1, MLH1, MSH2, MSH3, MSH6, MUTYH, NBN, NF1, NHTL1, PALB2, PDGFRA, PMS2, POLD1, POLE, PTEN, RAD50, RAD51C, RAD51D, SDHB, SDHC, SDHD, SMAD4, SMARCA4. STK11, TP53, TSC1, TSC2, and VHL.  The following genes were evaluated for sequence changes only: SDHA and HOXB13 c.251G>A variant only. The report date is 10/13/2018.    11/21/2018 Surgery   Right lumpectomy [name]) [phone]): IDC, grade 2, 2.6 cm, with DCIS. Negative margins. 1/4 lymph nodes positive for macrometastasis. ER/PR positive, HER-2 negative.   12/26/2018 - 06/01/2019 Chemotherapy  dexamethasone (DECADRON) 4 MG tablet, 1 of 1 cycle, Start date: 12/05/2018, End date:  03/02/2019  DOXOrubicin (ADRIAMYCIN) chemo injection 114 mg, 60 mg/m2 = 114 mg, Intravenous,  Once, 4 of 4 cycles. Administration: 114 mg (01/05/2019), 114 mg (01/19/2019), 114 mg (02/02/2019), 114 mg (02/15/2019)  palonosetron (ALOXI) injection 0.25 mg, 0.25 mg, Intravenous,  Once, 1 of 1 cycle. Administration: 0.25 mg (01/05/2019)  pegfilgrastim (NEULASTA ONPRO KIT) injection 6 mg, 6 mg, Subcutaneous, Once, 2 of 2 cycles  pegfilgrastim-jmdb (FULPHILA) injection 6 mg, 6 mg, Subcutaneous,  Once, 4 of 4 cycles. Administration: 6 mg (01/07/2019), 6 mg (01/21/2019), 6 mg (02/06/2019), 6 mg (02/17/2019)  cyclophosphamide (CYTOXAN) 1,140 mg in sodium chloride 0.9 % 250 mL chemo infusion, 600 mg/m2 = 1,140 mg, Intravenous,  Once, 4 of 4 cycles. Administration: 1,140 mg (01/05/2019), 1,140 mg (01/19/2019), 1,140 mg (02/02/2019), 1,140 mg (02/15/2019).  PACLitaxel (TAXOL) 150 mg in sodium chloride 0.9 % 250 mL chemo infusion (</= $RemoveBefor'80mg'DDUkwosHDtwW$ /m2), 80 mg/m2 = 150 mg, Intravenous,  Once, 12 of 12 cycles. Administration: 150 mg (03/02/2019), 150 mg (03/09/2019), 150 mg (03/23/2019), 150 mg (03/30/2019), 150 mg (04/06/2019), 150 mg (04/20/2019), 150 mg (04/27/2019), 150 mg (05/03/2019), 150 mg (05/11/2019), 150 mg (05/18/2019), 150 mg (05/25/2019), 150 mg (06/01/2019)  fosaprepitant (EMEND) 150 mg  dexamethasone (DECADRON) 12 mg in sodium chloride 0.9 % 145 mL IVPB, , Intravenous,  Once, 4 of 4 cycles. Administration:  (01/05/2019),  (01/19/2019),  (02/02/2019),  (02/15/2019).   06/21/2019 - 08/11/2019 Radiation Therapy   The patient initially received a dose of 50.4 Gy in 28 fractions to the breast using whole-breast tangent fields. This was delivered using a 3-D conformal technique. The pt received a boost delivering an additional 16 Gy in 8 fractions using a electron boost with 44meV electrons. The total dose was 66.4 Gy.    11/12/2019 Cancer Staging   Staging form: Breast, AJCC 8th Edition - Pathologic: Stage IB (pT2, pN1a, cM0, G2, ER+, PR+,  HER2-)     Oncotype testing   MammaPrint high risk     ALLERGIES:  is allergic to betadine [povidone-iodine].  MEDICATIONS:  Current Outpatient Medications  Medication Sig Dispense Refill   acetaminophen (TYLENOL) 500 MG tablet Take 1,000 mg by mouth every 6 (six) hours as needed.     albuterol (VENTOLIN HFA) 108 (90 Base) MCG/ACT inhaler INHALE 2 PUFFS INTO THE LUNGS EVERY 6 HOURS AS NEEDED FOR WHEEZING OR SHORTNESS OF BREATH 6.7 g 2   ALPRAZolam (XANAX) 0.25 MG tablet Take 1 tablet (0.25 mg total) by mouth 2 (two) times daily as needed for anxiety. 60 tablet 0   atomoxetine (STRATTERA) 40 MG capsule Take 40 mg by mouth daily.     Calcium Citrate-Vitamin D (CALCIUM + D PO) Take 1 tablet by mouth 3 (three) times daily.     capecitabine (XELODA) 500 MG tablet TAKE 4 TABLETS BY MOUTH 2 TIMES DAILY AFTER A MEAL FOR 14 DAYS, THEN HOLD FOR 7 DAYS, REPEAT EVERY 21 DAYS. 112 tablet 0   Cholecalciferol (VITAMIN D3) 50 MCG (2000 UT) TABS Take 1 tablet by mouth daily at 12 noon.     docusate sodium (COLACE) 100 MG capsule Take 100 mg by mouth 2 (two) times daily as needed for mild constipation.     guaiFENesin (ROBITUSSIN) 100 MG/5ML liquid Take 5 mLs by mouth every 4 (four) hours as needed for cough or to loosen phlegm. 120 mL 0   guanFACINE (INTUNIV) 2 MG TB24 ER tablet Take 2 mg by  mouth daily.     ibuprofen (ADVIL) 200 MG tablet Take 400 mg by mouth every 6 (six) hours as needed for mild pain.     ipratropium-albuterol (DUONEB) 0.5-2.5 (3) MG/3ML SOLN Take 3 mLs by nebulization every 4 (four) hours as needed. 360 mL 0   levofloxacin (LEVAQUIN) 750 MG tablet Take 1 tablet (750 mg total) by mouth daily for 2 days. 2 tablet 0   metoprolol tartrate (LOPRESSOR) 25 MG tablet Take 1 tablet (25 mg total) by mouth 2 (two) times daily. 60 tablet 1   morphine (MS CONTIN) 15 MG 12 hr tablet Take 2 tablets (30 mg total) by mouth every 8 (eight) hours. 90 tablet 0   ondansetron (ZOFRAN) 8 MG tablet Take 1  tablet (8 mg total) by mouth every 8 (eight) hours as needed for nausea or vomiting. 30 tablet 1   oxyCODONE (OXY IR/ROXICODONE) 5 MG immediate release tablet Take 1 tablet (5 mg total) by mouth every 6 (six) hours as needed for severe pain. 90 tablet 0   polyethylene glycol (MIRALAX / GLYCOLAX) 17 g packet Take 17 g by mouth daily. (Patient taking differently: Take 17 g by mouth daily as needed for mild constipation.) 14 each 0   promethazine (PHENERGAN) 25 MG tablet Take 1 tablet (25 mg total) by mouth every 6 (six) hours as needed for nausea or vomiting. 30 tablet 2   Tetrahydrozoline HCl (VISINE OP) Place 1 drop into both eyes daily as needed (redness).     venlafaxine XR (EFFEXOR-XR) 150 MG 24 hr capsule TAKE 1 CAPSULE(150 MG) BY MOUTH DAILY WITH BREAKFAST (Patient taking differently: 150 mg at bedtime.) 30 capsule 1   No current facility-administered medications for this visit.   Facility-Administered Medications Ordered in Other Visits  Medication Dose Route Frequency Provider Last Rate Last Admin   denosumab (XGEVA) injection 120 mg  120 mg Subcutaneous Once Lloyd Huger, MD        VITAL SIGNS: LMP 01/06/2019 (Approximate)  There were no vitals filed for this visit.  Estimated body mass index is 30.36 kg/m as calculated from the following:   Height as of 02/25/21: _0  (1.6 m).   Weight as of an earlier encounter on 02/27/21: 77.7 kg (171 lb 6.4 oz).  LABS: CBC:    Component Value Date/Time   WBC 9.3 02/27/2021 1014   HGB 11.2 (L) 02/27/2021 1014   HGB 13.1 06/01/2019 1340   HCT 32.7 (L) 02/27/2021 1014   PLT 303 02/27/2021 1014   PLT 322 06/01/2019 1340   MCV 99.4 02/27/2021 1014   NEUTROABS 7.6 02/27/2021 1014   LYMPHSABS 0.5 (L) 02/27/2021 1014   MONOABS 0.7 02/27/2021 1014   EOSABS 0.4 02/27/2021 1014   BASOSABS 0.0 02/27/2021 1014   Comprehensive Metabolic Panel:    Component Value Date/Time   NA 133 (L) 02/27/2021 1014   K 4.0 02/27/2021 1014   CL 99  02/27/2021 1014   CO2 31 02/27/2021 1014   BUN 9 02/27/2021 1014   CREATININE 0.56 02/27/2021 1014   CREATININE 0.59 06/01/2019 1340   GLUCOSE 96 02/27/2021 1014   CALCIUM 8.5 (L) 02/27/2021 1014   AST 35 02/27/2021 1014   AST 21 06/01/2019 1340   ALT 18 02/27/2021 1014   ALT 26 06/01/2019 1340   ALKPHOS 77 02/27/2021 1014   BILITOT 0.2 (L) 02/27/2021 1014   BILITOT 0.5 06/01/2019 1340   PROT 7.1 02/27/2021 1014   ALBUMIN 3.4 (L) 02/27/2021 1014  Present during today's visit: patient and her brother  Assessment and Plan: Continue with capecitabine (Xeloda) 4 tablets (2000 mg) twice daily for 14 days on and 7 days off. Due to start her next cycle on 02/28/21.   Oral Chemotherapy Side Effect/Intolerance:  Nausea and food aversions: Patient is experiencing a lot of nausea. Ondansetron was not working to relieve the nausea. Patient had promethazine sent in a couple of weeks ago which has been helpful. Patient is experiencing food aversion, making it difficult to eat with capecitabine. Patient has a nutrition appointment today.  Continue promethazine 25 mg every 6 hours as needed for nausea. Recommended taking prochlorperazine 30-60 minutes prior to capecitabine to prevent nausea and vomiting. A prescription has been sent in. Consider dose reduction if nausea and food aversion does not improve with next capecitabine cycle. Follow-up with nutritions recommendations.  Fatigue: Patient is experiencing increased fatigue. Patient recently underwent TAH/BSO, followed by hospitalization for treatment of CAP and NSTEMI. Patient also recently stopped Adderall and mentioned this may be contributing, in addition to capecitabine.  Patient would like to try another cycle of capecitabine at 2000 mg twice daily. Discussed with patient that capecitabine dose could be reduced if fatigue continues.  Patient reports no other side effects.  Oral Chemotherapy Adherence: no missed doses reported No patient  barriers to medication adherence identified.   New medications: levofloxacin (2 days of therapy remaining), promethazine, metoprolol, guaifenesin.  Checked for DDIs today - one relevant DDI identified with levofloxacin due to increased risk of QT prolongation. Patient has 2 days of therapy remaining. 2/20 EKG: QT/QTc: 330/450.  Medication Access Issues: no issues identified. Patient receives medication from [facility]. Medication has been delivered for upcoming cycle (starting tomorrow, 2/24).   Patient expressed understanding and was in agreement with this plan. She also understands that She can call clinic at any time with any questions, concerns, or complaints.   Follow-up plan: RTC in 3 weeks   Thank you for allowing me to participate in the care of this very pleasant patient.   Time Total: 25 minutes  Visit consisted of counseling and education on dealing with issues of symptom management in the setting of serious and potentially life-threatening illness.Greater than 50%  of this time was spent counseling and coordinating care related to the above assessment and plan.  Visit completed by: Deatra Robinson, PharmD Oncology PGY2  Signed by: Darl Pikes, PharmD, BCPS, Salley Slaughter, CPP Hematology/Oncology Clinical Pharmacist Practitioner [place]/DB/AP Oral [facility] [phone]  02/27/2021 11:51 AM

## 2021-02-27 NOTE — Progress Notes (Signed)
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  INTERVAL HISTORY: Patient returns to clinic today for further evaluation, hospital follow-up, and consideration of cycle 2 of Xeloda.  She tolerated her first cycle well without significant side effects.  She continues to have shortness of breath, but this has improved since discharge.  She denies any further fevers.  She continues to have left-sided chest/flank pain. She has a mild intermittent neuropathy, but no other neurologic complaints.  She has a good appetite.  She denies any cough or hemoptysis. She denies any nausea, vomiting, constipation, or diarrhea.  She has no urinary complaints.  Patient offers no further specific complaints today.  REVIEW OF SYSTEMS:   Review of Systems  Constitutional: Negative.  Negative for fever, malaise/fatigue and weight loss.  Respiratory:  Positive for shortness of breath. Negative for cough and hemoptysis.   Cardiovascular:  Positive for chest pain. Negative for leg swelling.  Gastrointestinal: Negative.  Negative for abdominal pain.  Genitourinary:  Positive for flank pain. Negative for dysuria.  Musculoskeletal:  Negative for back pain.  Skin: Negative.  Negative for rash.  Neurological:  Positive for tingling. Negative for dizziness, focal weakness, weakness and headaches.  Psychiatric/Behavioral:  The patient is nervous/anxious. The patient does  not have insomnia.    As per HPI. Otherwise, a complete review of systems is negative.  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   secondary to primiary breast cancer   Malignant neoplasm of upper-outer quadrant of right breast in female, estrogen receptor positive [facility])    oncologist--- dr Grayland Ormond;;  first dx 09/ 2020 s/p right lumpectomy w/ node dissection and completed chemoradiation 08/ 2021;;  recurrent 07/ 2022 to lung pleura,liver, and thoracic spine   SOB (shortness of breath)    02-12-2021  pt denies with regular activity but sob with stairs    PAST SURGICAL HISTORY: Past Surgical History:  Procedure Laterality Date   ABDOMINAL HYSTERECTOMY N/A 02/17/2021   BREAST LUMPECTOMY WITH AXILLARY LYMPH NODE BIOPSY Right 11/21/2018   Procedure: RIGHT BREAST LUMPECTOMY WITH SENTINEL LYMPH NODE BIOPSY;  Surgeon: Jovita Kussmaul, MD;  Location: [place];  Service: General;  Laterality: Right;   BREAST REDUCTION SURGERY Bilateral 11/21/2018   Procedure: BILATERAL MAMMARY REDUCTION  (BREAST);  Surgeon: Wallace Going, DO;  Location: [place];  Service: Plastics;  Laterality: Bilateral;   CHEST TUBE INSERTION Left 07/29/2020   Procedure: INSERTION PLEURAL DRAINAGE CATHETER;  Surgeon: Lajuana Matte, MD;  Location: [place];  Service: Thoracic;  Laterality: Left;   IRRIGATION AND DEBRIDEMENT STERNOCLAVICULAR JOINT-STERNUM AND RIBS N/A 08/13/2020   Procedure: IRRIGATION AND DEBRIDEMENT CHEST  WALL ABSCESS;  Surgeon: Lajuana Matte, MD;  Location: [place];  Service: Cardiothoracic;  Laterality: N/A;   IUD REMOVAL N/A 02/17/2021   Procedure: INTRAUTERINE  DEVICE (IUD) REMOVAL;  Surgeon: Linda Hedges, DO;  Location: [place];  Service: Gynecology;  Laterality: N/A;   LAPAROSCOPIC SALPINGO OOPHERECTOMY Bilateral 02/17/2021   Procedure: LAPAROSCOPIC BILATERAL SALPINGO OOPHORECTOMY;  Surgeon: Linda Hedges, DO;  Location: [place];  Service: Gynecology;  Laterality: Bilateral;   LEFT HEART CATH AND CORONARY ANGIOGRAPHY N/A 02/25/2021   Procedure: LEFT HEART CATH AND CORONARY ANGIOGRAPHY possible PCI and stent;  Surgeon: Yolonda Kida, MD;  Location: [place] [facility];  Service: Cardiovascular;  Laterality: N/A;   PLEURAL BIOPSY Left 07/29/2020   Procedure: PLEURAL BIOPSY;  Surgeon: Lajuana Matte, MD;  Location: [place];  Service: Thoracic;  Laterality: Left;   PLEURAL EFFUSION DRAINAGE Left 07/29/2020   Procedure: DRAINAGE OF PLEURAL EFFUSION;  Surgeon: Lajuana Matte, MD;  Location: [place] [facility];  Service: Thoracic;  Laterality: Left;   PORT-A-CATH REMOVAL N/A 07/06/2019   Procedure: REMOVAL PORT-A-CATH;  Surgeon: Jovita Kussmaul, MD;  Location: [place];  Service: General;  Laterality: N/A;   PORTACATH PLACEMENT N/A 11/21/2018   Procedure: INSERTION LEFT PORT-A-CATH WITH ULTRASOUND GUIDANCE;  Surgeon: Jovita Kussmaul, MD;  Location: [place];  Service: General;  Laterality: N/A;   TONSILLECTOMY  1987   VIDEO ASSISTED THORACOSCOPY Left 07/29/2020   Procedure: VIDEO ASSISTED THORACOSCOPY;  Surgeon: Lajuana Matte, MD;  Location: [facility];  Service: Thoracic;  Laterality: Left;   WISDOM TOOTH EXTRACTION      FAMILY HISTORY: Family History  Problem Relation Age of Onset   Hypertension Father    Alcohol abuse Paternal Grandfather    Heart disease Paternal Grandfather    Alcohol abuse Maternal Grandfather    Heart disease Maternal Grandmother    Breast cancer Other     ADVANCED DIRECTIVES (Y/N):  N  HEALTH MAINTENANCE: Social History   Tobacco Use   Smoking status: Former     Packs/day: 0.50    Years: 20.00    Pack years: 10.00    Types: Cigarettes    Start date: 01/13/2002    Quit date: 10/17/2016    Years since quitting: 4.3   Smokeless tobacco: Never  Vaping Use   Vaping Use: Never used  Substance Use Topics   Alcohol use: Yes    Comment: Socially   Drug use: Never     Colonoscopy:  PAP:  Bone density:  Lipid panel:  Allergies  Allergen Reactions   Betadine [Povidone-Iodine] Rash    Current Outpatient Medications  Medication Sig Dispense Refill   acetaminophen (TYLENOL) 500 MG tablet Take 1,000 mg by mouth every 6 (six) hours as needed.     albuterol (VENTOLIN HFA) 108 (90 Base) MCG/ACT inhaler INHALE 2 PUFFS INTO THE LUNGS EVERY 6 HOURS AS NEEDED FOR WHEEZING OR SHORTNESS OF BREATH 6.7 g 2   ALPRAZolam (XANAX) 0.25 MG tablet Take 1 tablet (0.25 mg total) by mouth 2 (two) times daily as needed for anxiety. 60 tablet 0   atomoxetine (STRATTERA) 40 MG capsule Take 40 mg by mouth daily.     Calcium Citrate-Vitamin D (CALCIUM + D PO) Take 1 tablet by mouth 3 (three) times daily.     capecitabine (XELODA) 500 MG tablet TAKE 4 TABLETS BY MOUTH 2 TIMES DAILY AFTER A MEAL FOR 14 DAYS, THEN HOLD FOR 7 DAYS, REPEAT EVERY  21 DAYS. 112 tablet 0   Cholecalciferol (VITAMIN D3) 50 MCG (2000 UT) TABS Take 1 tablet by mouth daily at 12 noon.     docusate sodium (COLACE) 100 MG capsule Take 100 mg by mouth 2 (two) times daily as needed for mild constipation.     guaiFENesin (ROBITUSSIN) 100 MG/5ML liquid Take 5 mLs by mouth every 4 (four) hours as needed for cough or to loosen phlegm. 120 mL 0   guanFACINE (INTUNIV) 2 MG TB24 ER tablet Take 2 mg by mouth daily.     ibuprofen (ADVIL) 200 MG tablet Take 400 mg by mouth every 6 (six) hours as needed for mild pain.     ipratropium-albuterol (DUONEB) 0.5-2.5 (3) MG/3ML SOLN Take 3 mLs by nebulization every 4 (four) hours as needed. 360 mL 0   levofloxacin (LEVAQUIN) 750 MG tablet Take 1 tablet (750 mg total) by mouth  daily for 2 days. 2 tablet 0   metoprolol tartrate (LOPRESSOR) 25 MG tablet Take 1 tablet (25 mg total) by mouth 2 (two) times daily. 60 tablet 1   morphine (MS CONTIN) 15 MG 12 hr tablet Take 2 tablets (30 mg total) by mouth every 8 (eight) hours. 90 tablet 0   ondansetron (ZOFRAN) 8 MG tablet Take 1 tablet (8 mg total) by mouth every 8 (eight) hours as needed for nausea or vomiting. 30 tablet 1   oxyCODONE (OXY IR/ROXICODONE) 5 MG immediate release tablet Take 1 tablet (5 mg total) by mouth every 6 (six) hours as needed for severe pain. 90 tablet 0   polyethylene glycol (MIRALAX / GLYCOLAX) 17 g packet Take 17 g by mouth daily. (Patient taking differently: Take 17 g by mouth daily as needed for mild constipation.) 14 each 0   promethazine (PHENERGAN) 25 MG tablet Take 1 tablet (25 mg total) by mouth every 6 (six) hours as needed for nausea or vomiting. 30 tablet 2   Tetrahydrozoline HCl (VISINE OP) Place 1 drop into both eyes daily as needed (redness).     venlafaxine XR (EFFEXOR-XR) 150 MG 24 hr capsule TAKE 1 CAPSULE(150 MG) BY MOUTH DAILY WITH BREAKFAST (Patient taking differently: 150 mg at bedtime.) 30 capsule 1   meloxicam (MOBIC) 7.5 MG tablet Take 1 tablet (7.5 mg total) by mouth daily. 30 tablet 0   No current facility-administered medications for this visit.    OBJECTIVE: Vitals:   02/27/21 1044  BP: 120/76  Pulse: (!) 104  Resp: 18  Temp: 98.9 F (37.2 C)  SpO2: 100%       Body mass index is 30.36 kg/m.    ECOG FS:0 - Asymptomatic  General: Well-developed, well-nourished, no acute distress. Eyes: Pink conjunctiva, anicteric sclera. HEENT: Normocephalic, moist mucous membranes. Lungs: No audible wheezing or coughing. Heart: Regular rate and rhythm. Abdomen: Soft, nontender, no obvious distention. Musculoskeletal: No edema, cyanosis, or clubbing. Neuro: Alert, answering all questions appropriately. Cranial nerves grossly intact. Skin: No rashes or petechiae  noted. Psych: Normal affect.  LAB RESULTS:  Lab Results  Component Value Date   NA 133 (L) 02/27/2021   K 4.0 02/27/2021   CL 99 02/27/2021   CO2 31 02/27/2021   GLUCOSE 96 02/27/2021   BUN 9 02/27/2021   CREATININE 0.56 02/27/2021   CALCIUM 8.5 (L) 02/27/2021   PROT 7.1 02/27/2021   ALBUMIN 3.4 (L) 02/27/2021   AST 35 02/27/2021   ALT 18 02/27/2021   ALKPHOS 77 02/27/2021   BILITOT 0.2 (L) 02/27/2021   GFRNONAA >60 02/27/2021  GFRAA >60 08/07/2019    Lab Results  Component Value Date   WBC 9.3 02/27/2021   NEUTROABS 7.6 02/27/2021   HGB 11.2 (L) 02/27/2021   HCT 32.7 (L) 02/27/2021   MCV 99.4 02/27/2021   PLT 303 02/27/2021     STUDIES: DG Chest 2 View  Result Date: 02/21/2021 CLINICAL DATA:  A 41 year old female presents with shortness of breath and history of breast cancer. EXAM: CHEST - 2 VIEW COMPARISON:  Chest x-ray from October 01, 2020 and CT of the chest from January 27, 2021. FINDINGS: Cardiomediastinal contours and hilar structures are stable. LEFT hemidiaphragm remains obscured. Signs of LEFT pleural effusion and basilar airspace process. Subtle background nodularity has increased in the RIGHT chest over time. No additional areas of frank consolidative changes. On limited assessment there is no acute skeletal process. Evidence of prior RIGHT axillary dissection presumably projecting over the RIGHT chest. IMPRESSION: 1. Malignant effusion in the LEFT chest with further consolidation or enlarging nodules and masses in this area. Difficult to exclude developing infection in the LEFT lung base. 2. Scattered small nodular densities in the RIGHT chest appearing more conspicuous than on previous imaging may represent a combination of known metastatic disease and superimposed infection. Electronically Signed   By: Zetta Bills M.D.   On: 02/21/2021 13:22   CT Angio Chest PE W and/or Wo Contrast  Addendum Date: 02/24/2021   ADDENDUM REPORT: 02/24/2021 07:47  ADDENDUM: As stated in the original dictation, left-sided pleural metastasis, widespread pulmonary metastases and hepatic metastases all appear increased compared to prior examinations. In addition, there is consolidative airspace disease in the left lower lobe, concerning for pneumonia. Electronically Signed   By: Vinnie Langton M.D.   On: 02/24/2021 07:47   Result Date: 02/24/2021 CLINICAL DATA:  Pulmonary embolism suspected. High probability. Metastatic breast cancer. Shortness of breath. EXAM: CT ANGIOGRAPHY CHEST WITH CONTRAST TECHNIQUE: Multidetector CT imaging of the chest was performed using the standard protocol during bolus administration of intravenous contrast. Multiplanar CT image reconstructions and MIPs were obtained to evaluate the vascular anatomy. RADIATION DOSE REDUCTION: This exam was performed according to the departmental dose-optimization program which includes automated exposure control, adjustment of the mA and/or kV according to patient size and/or use of iterative reconstruction technique. CONTRAST:  126mL OMNIPAQUE IOHEXOL 350 MG/ML SOLN COMPARISON:  Chest radiography same day.  Chest CT 01/27/2021. FINDINGS: Cardiovascular: Cardiomegaly with left ventricular prominence. No visible aortic atherosclerotic calcification. Pulmonary arterial opacification is good. There are no pulmonary emboli. Mediastinum/Nodes: Over the last month, there has been considerable growth and mediastinal tumor. Anterior mediastinal disease previously measured at 15 and 17 mm on axial image 32 now measures 17 and 20 mm. Increased tumor growth in the subcarinal region and left hilum and along the left pericardial margin. Lungs/Pleura: Marked worsening of pulmonary metastatic disease. Innumerable nodules scattered throughout the lungs measuring 9 mm in size and smaller. There is worsening collapse of the left lower lobe. Lobular pleural disease on the left has worsened. Upper Abdomen: Worsening of hepatic  metastatic disease. More numerous and larger metastatic lesions. For example, metastasis at the dome of the liver on the right previously measured 18 mm in diameter and now measures 21 mm in diameter. Musculoskeletal: Lytic sclerotic metastatic disease throughout the spine and ribs appears similar. Review of the MIP images confirms the above findings. IMPRESSION: No pulmonary emboli. Progression of metastatic tumor within the mediastinum, left hilum, manifest as nodular metastatic lesions throughout both lungs, and within the liver.  Worsened volume loss in the left lower lobe with progression of pleural disease. This interpretation is rendered on an emergent basis in order to rule out pulmonary emboli. The exam will be reviewed and addended by body/oncologic specialist radiologist tomorrow. Electronically Signed: By: Nelson Chimes M.D. On: 02/23/2021 21:42   CARDIAC CATHETERIZATION  Result Date: 02/25/2021   The left ventricular systolic function is normal.   LV end diastolic pressure is normal.   The left ventricular ejection fraction is 55-65% by visual estimate. Conclusion Normal left ventricular function EF of 60% Normal coronaries Intervention deferred No conclusion   DG Chest Port 1 View  Result Date: 02/24/2021 CLINICAL DATA:  Shortness of breath, chest pain EXAM: PORTABLE CHEST 1 VIEW COMPARISON:  Previous studies including the examination of 02/23/2021 FINDINGS: Transverse diameter of heart is increased. There is opacification of left lower lung fields consistent with pleural effusion and possibly underlying infiltrate with no significant change. Are possible small nodular densities in the right mid and right lower lung fields. Right lateral CP angle is clear. There is no pneumothorax. IMPRESSION: No significant interval changes are noted in the opacification of left lower lung fields suggesting pleural effusion and possibly underlying infiltrate. Electronically Signed   By: Elmer Picker M.D.    On: 02/24/2021 15:06   DG Chest Portable 1 View  Result Date: 02/23/2021 CLINICAL DATA:  Chest pain and shortness of breath. History of metastatic breast cancer. EXAM: PORTABLE CHEST 1 VIEW COMPARISON:  02/21/2021 and prior radiograph FINDINGS: The cardiomediastinal silhouette is unchanged. LEFT LOWER lung consolidation/atelectasis and LEFT pleural thickening again noted. No new pulmonary findings are noted. There is no evidence of pneumothorax or acute bony abnormality. IMPRESSION: Unchanged appearance of the chest with LEFT LOWER lung consolidation/atelectasis and LEFT pleural thickening. Electronically Signed   By: Margarette Canada M.D.   On: 02/23/2021 20:42    ONCOLOGY HISTORY: Patient initially self palpated a lump in the 12 o'clock position of her right breast and subsequently underwent biopsy on September 26, 2018 confirming malignancy.  Initial MammaPrint was reported high risk.  She underwent right lumpectomy on November 21, 2018 which revealed a T2, N1, M0 stage IIa malignancy.  1 of 4 lymph nodes were positive for disease.  She subsequently underwent adjuvant chemotherapy with AC/Taxol completing treatment on Jun 01, 2019.  She then completed adjuvant XRT on August 11, 2019 and was started on tamoxifen.  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 PET scan results from July 18, 2020 reviewed independently with circumferential pleural disease and a loculated left pleural effusion.  MRI of the brain on August 18, 2020 was negative for metastatic disease.  Patient also underwent second opinion at [facility] in [place].  MRI of thoracic spine on December 13, 2020 revealed a likely metastatic lesion  in T9 vertebrae.  Despite the fact that her CA 27-29 is trending down, recent CT scan revealed progression of disease in her liver and possibly her lungs.  Total hysterectomy also noted a metastatic deposit in her ovary. Case discussed with Dr. Samuel Bouche at [facility].  Proceed with cycle 2 of Xeloda for 14 days on and 7 days off.  Repeat CT scan in ~2 months.  Zometa was  discontinued secondary to intolerance, therefore proceed with Xgeva on even numbered cycles.  Return to clinic in 3 weeks for further evaluation and continuation of treatment.  Cough/shortness of breath: Improved.  Continue Tussionex, hydrocodone, and albuterol inhaler.  Complete antibiotics as prescribed. Anasarca/fluid retention: Resolved.   Pleural abscess: Treated and drained by thoracic surgery in [place].  Resolved.  Chest x-ray and possible thoracentesis as above.   Pain: MS Contin was increased to 30 mg every 8 hours.  Continue oxycodone as needed.  Patient was also initiated on meloxicam.  Follow-up with radiation oncology as scheduled.  Appreciate palliative care input.  Oophorectomy: Completed on February 17, 2021.  Pathology results as above. Supportive care: Patient has been seen by dietary and referral sent to social work.  Patient expressed understanding and was in agreement with this plan. She also understands that She can call clinic at any time with any questions, concerns, or complaints.     Cancer Staging  Malignant neoplasm of upper-outer quadrant of right breast in female, estrogen receptor positive ([place]) Staging form: Breast, AJCC 8th Edition - Clinical stage from 10/05/2018: Stage IB (cT2, cN0, cM0, G2, ER+, PR+, HER2-) - Signed by Chauncey Cruel, MD on 04/02/2020 Stage prefix: Initial diagnosis Histologic grading system: 3 grade system - Pathologic stage from 08/09/2020: No Stage Recommended (ypT2, pN1a, pM1, G2, ER+, PR+, HER2-) - Signed by Lloyd Huger, MD on 08/09/2020 Stage prefix:  Post-therapy Multigene prognostic tests performed: MammaPrint Histologic grading system: 3 grade system   Lloyd Huger, MD   02/27/2021 2:20 PM

## 2021-02-27 NOTE — Progress Notes (Signed)
[place] at [facility] [phone] [phone]   Name: Kaylee Romero Date: 02/27/2021 MRN: [id number]  DOB: 11-Jun-1980  Patient Care Team: Dion Body, MD as PCP - General (Family Medicine) Gery Pray, MD as Consulting Physician (Radiation Oncology) Jovita Kussmaul, MD as Consulting Physician (General Surgery) Dillingham, Loel Lofty, DO as Attending Physician (Plastic Surgery) Lloyd Huger, MD as Consulting Physician (Oncology)    REASON FOR CONSULTATION: Kaylee Romero is a 41 y.o. 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  Patient then developed recurrence on tamoxifen.  Patient developed worsening shortness of breath and was found to have recurrence in July 2022 with malignant pleural effusion ultimately requiring VATS procedure and Pleurx.  Patient is status post BSO on 02/17/2021. Patient has had second and third opinions from [facility] and [facility].  She is on third line chemotherapy for breast cancer.  She is status post XRT to the spine/ribs.  She is referred to palliative care to help address goals and manage ongoing symptoms.  SOCIAL HISTORY:     reports that she quit smoking about 4 years ago. Her smoking use included cigarettes. She started smoking about 19 years ago. She has a 10.00 pack-year smoking history. She has never used smokeless tobacco. She reports current alcohol use. She reports that she does not use drugs.  Patient is married and lives at home with her husband and 62-1/2-year-old child.  Patient worked as a second [employer] prior to taking leave due to her  cancer diagnosis.   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   Malignant neoplasm of upper-outer quadrant of right breast in female, estrogen receptor positive [facility])    oncologist--- dr Grayland Ormond;;  first dx 09/ 2020 s/p right lumpectomy w/ node dissection and completed chemoradiation 08/ 2021;;  recurrent 07/ 2022 to lung pleura,liver, and thoracic spine   SOB (shortness of breath)    02-12-2021  pt denies with regular activity but sob with stairs    PAST SURGICAL HISTORY:  Past Surgical History:  Procedure Laterality Date   ABDOMINAL HYSTERECTOMY N/A 02/17/2021   BREAST LUMPECTOMY WITH AXILLARY LYMPH NODE BIOPSY Right 11/21/2018   Procedure: RIGHT BREAST LUMPECTOMY WITH SENTINEL LYMPH NODE BIOPSY;  Surgeon: Jovita Kussmaul, MD;  Location: [place];  Service: General;  Laterality: Right;   BREAST REDUCTION SURGERY Bilateral 11/21/2018   Procedure: BILATERAL MAMMARY REDUCTION  (BREAST);  Surgeon: Wallace Going, DO;  Location: [place];  Service: Plastics;  Laterality: Bilateral;   CHEST TUBE INSERTION Left 07/29/2020   Procedure: INSERTION PLEURAL DRAINAGE CATHETER;  Surgeon: Lajuana Matte, MD;  Location: [facility];  Service: Thoracic;  Laterality: Left;   IRRIGATION AND DEBRIDEMENT STERNOCLAVICULAR JOINT-STERNUM AND RIBS N/A  08/13/2020   Procedure: IRRIGATION AND DEBRIDEMENT CHEST WALL ABSCESS;  Surgeon: Lajuana Matte, MD;  Location: [facility];  Service: Cardiothoracic;  Laterality: N/A;   IUD REMOVAL N/A 02/17/2021   Procedure: INTRAUTERINE DEVICE (IUD)  REMOVAL;  Surgeon: Linda Hedges, DO;  Location: [place];  Service: Gynecology;  Laterality: N/A;   LAPAROSCOPIC SALPINGO OOPHERECTOMY Bilateral 02/17/2021   Procedure: LAPAROSCOPIC BILATERAL SALPINGO OOPHORECTOMY;  Surgeon: Linda Hedges, DO;  Location: [place];  Service: Gynecology;  Laterality: Bilateral;   LEFT HEART CATH AND CORONARY ANGIOGRAPHY N/A 02/25/2021   Procedure: LEFT HEART CATH AND CORONARY ANGIOGRAPHY possible PCI and stent;  Surgeon: Yolonda Kida, MD;  Location: [place] [facility];  Service: Cardiovascular;  Laterality: N/A;   PLEURAL BIOPSY Left 07/29/2020   Procedure: PLEURAL BIOPSY;  Surgeon: Lajuana Matte, MD;  Location: [place];  Service: Thoracic;  Laterality: Left;   PLEURAL EFFUSION DRAINAGE Left 07/29/2020   Procedure: DRAINAGE OF PLEURAL EFFUSION;  Surgeon: Lajuana Matte, MD;  Location: [place];  Service: Thoracic;  Laterality: Left;   PORT-A-CATH REMOVAL N/A 07/06/2019   Procedure: REMOVAL PORT-A-CATH;  Surgeon: Jovita Kussmaul, MD;  Location: [place];  Service: General;  Laterality: N/A;   PORTACATH PLACEMENT N/A 11/21/2018   Procedure: INSERTION LEFT PORT-A-CATH WITH ULTRASOUND GUIDANCE;  Surgeon: Jovita Kussmaul, MD;  Location: [place];  Service: General;  Laterality: N/A;   [place] Left 07/29/2020   Procedure: VIDEO ASSISTED THORACOSCOPY;  Surgeon: Lajuana Matte, MD;  Location: [place];  Service: Thoracic;  Laterality: Left;   WISDOM TOOTH EXTRACTION      HEMATOLOGY/ONCOLOGY HISTORY:  Oncology History  Malignant neoplasm of upper-outer quadrant of right breast in female, estrogen receptor positive ([place])  09/29/2018 Initial Diagnosis   Malignant neoplasm of upper-outer quadrant of right breast in female, estrogen receptor positive ([place])   09/2018 -  Anti-estrogen oral therapy   Tamoxifen; held from 12/05/2018-09/06/2019 for surgery   10/05/2018  Cancer Staging   Staging form: Breast, AJCC 8th Edition - Clinical stage from 10/05/2018: Stage IB (cT2, cN0, cM0, G2, ER+, PR+, HER2-) - Signed by Chauncey Cruel, MD on 04/02/2020 Stage prefix: Initial diagnosis Histologic grading system: 3 grade system    10/13/2018 Genetic Testing   Negative genetic testing. No pathogenic variants identified on the [facility] Breast Cancer STAT Panel + Common Hereditary Cancers Panel. The Common Hereditary Cancers Panel offered by [facility] includes sequencing and/or deletion duplication testing of the following 47 genes: APC, ATM, AXIN2, BARD1, BMPR1A, BRCA1, BRCA2, BRIP1, CDH1, CDKN2A (p14ARF), CDKN2A (p16INK4a), CKD4, CHEK2, CTNNA1, DICER1, EPCAM (Deletion/duplication testing only), GREM1 (promoter region deletion/duplication testing only), KIT, MEN1, MLH1, MSH2, MSH3, MSH6, MUTYH, NBN, NF1, NHTL1, PALB2, PDGFRA, PMS2, POLD1, POLE, PTEN, RAD50, RAD51C, RAD51D, SDHB, SDHC, SDHD, SMAD4, SMARCA4. STK11, TP53, TSC1, TSC2, and VHL.  The following genes were evaluated for sequence changes only: SDHA and HOXB13 c.251G>A variant only. The report date is 10/13/2018.    11/21/2018 Surgery   Right lumpectomy [name]) [id number]): IDC, grade 2, 2.6 cm, with DCIS. Negative margins. 1/4 lymph nodes positive for macrometastasis. ER/PR positive, HER-2 negative.   12/26/2018 - 06/01/2019 Chemotherapy   dexamethasone (DECADRON) 4 MG tablet, 1 of 1 cycle, Start date: 12/05/2018, End date: 03/02/2019  DOXOrubicin (ADRIAMYCIN) chemo injection 114 mg, 60 mg/m2 = 114 mg, Intravenous,  Once, 4 of 4 cycles. Administration: 114 mg (01/05/2019), 114 mg (01/19/2019), 114 mg (02/02/2019), 114  mg (02/15/2019)  palonosetron (ALOXI) injection 0.25 mg, 0.25 mg, Intravenous,  Once, 1 of 1 cycle. Administration: 0.25 mg (01/05/2019)  pegfilgrastim (NEULASTA ONPRO KIT) injection 6 mg, 6 mg, Subcutaneous, Once, 2 of 2 cycles  pegfilgrastim-jmdb (FULPHILA) injection 6 mg, 6 mg, Subcutaneous,  Once, 4  of 4 cycles. Administration: 6 mg (01/07/2019), 6 mg (01/21/2019), 6 mg (02/06/2019), 6 mg (02/17/2019)  cyclophosphamide (CYTOXAN) 1,140 mg in sodium chloride 0.9 % 250 mL chemo infusion, 600 mg/m2 = 1,140 mg, Intravenous,  Once, 4 of 4 cycles. Administration: 1,140 mg (01/05/2019), 1,140 mg (01/19/2019), 1,140 mg (02/02/2019), 1,140 mg (02/15/2019).  PACLitaxel (TAXOL) 150 mg in sodium chloride 0.9 % 250 mL chemo infusion (</= $RemoveBefor'80mg'PKmuQXEWxNve$ /m2), 80 mg/m2 = 150 mg, Intravenous,  Once, 12 of 12 cycles. Administration: 150 mg (03/02/2019), 150 mg (03/09/2019), 150 mg (03/23/2019), 150 mg (03/30/2019), 150 mg (04/06/2019), 150 mg (04/20/2019), 150 mg (04/27/2019), 150 mg (05/03/2019), 150 mg (05/11/2019), 150 mg (05/18/2019), 150 mg (05/25/2019), 150 mg (06/01/2019)  fosaprepitant (EMEND) 150 mg  dexamethasone (DECADRON) 12 mg in sodium chloride 0.9 % 145 mL IVPB, , Intravenous,  Once, 4 of 4 cycles. Administration:  (01/05/2019),  (01/19/2019),  (02/02/2019),  (02/15/2019).   06/21/2019 - 08/11/2019 Radiation Therapy   The patient initially received a dose of 50.4 Gy in 28 fractions to the breast using whole-breast tangent fields. This was delivered using a 3-D conformal technique. The pt received a boost delivering an additional 16 Gy in 8 fractions using a electron boost with 1meV electrons. The total dose was 66.4 Gy.    11/12/2019 Cancer Staging   Staging form: Breast, AJCC 8th Edition - Pathologic: Stage IB (pT2, pN1a, cM0, G2, ER+, PR+, HER2-)     Oncotype testing   MammaPrint high risk     ALLERGIES:  is allergic to betadine [povidone-iodine].  MEDICATIONS:  Current Outpatient Medications  Medication Sig Dispense Refill   acetaminophen (TYLENOL) 500 MG tablet Take 1,000 mg by mouth every 6 (six) hours as needed.     albuterol (VENTOLIN HFA) 108 (90 Base) MCG/ACT inhaler INHALE 2 PUFFS INTO THE LUNGS EVERY 6 HOURS AS NEEDED FOR WHEEZING OR SHORTNESS OF BREATH 6.7 g 2   ALPRAZolam (XANAX) 0.25 MG tablet Take 1 tablet  (0.25 mg total) by mouth 2 (two) times daily as needed for anxiety. 60 tablet 0   atomoxetine (STRATTERA) 40 MG capsule Take 40 mg by mouth daily.     Calcium Citrate-Vitamin D (CALCIUM + D PO) Take 1 tablet by mouth 3 (three) times daily.     capecitabine (XELODA) 500 MG tablet TAKE 4 TABLETS BY MOUTH 2 TIMES DAILY AFTER A MEAL FOR 14 DAYS, THEN HOLD FOR 7 DAYS, REPEAT EVERY 21 DAYS. 112 tablet 0   Cholecalciferol (VITAMIN D3) 50 MCG (2000 UT) TABS Take 1 tablet by mouth daily at 12 noon.     docusate sodium (COLACE) 100 MG capsule Take 100 mg by mouth 2 (two) times daily as needed for mild constipation.     guaiFENesin (ROBITUSSIN) 100 MG/5ML liquid Take 5 mLs by mouth every 4 (four) hours as needed for cough or to loosen phlegm. 120 mL 0   guanFACINE (INTUNIV) 2 MG TB24 ER tablet Take 2 mg by mouth daily.     ibuprofen (ADVIL) 200 MG tablet Take 400 mg by mouth every 6 (six) hours as needed for mild pain.     ipratropium-albuterol (DUONEB) 0.5-2.5 (3) MG/3ML SOLN Take 3 mLs by nebulization every 4 (four) hours as  needed. 360 mL 0   levofloxacin (LEVAQUIN) 750 MG tablet Take 1 tablet (750 mg total) by mouth daily for 2 days. 2 tablet 0   metoprolol tartrate (LOPRESSOR) 25 MG tablet Take 1 tablet (25 mg total) by mouth 2 (two) times daily. 60 tablet 1   morphine (MS CONTIN) 15 MG 12 hr tablet Take 2 tablets (30 mg total) by mouth every 8 (eight) hours. 90 tablet 0   ondansetron (ZOFRAN) 8 MG tablet Take 1 tablet (8 mg total) by mouth every 8 (eight) hours as needed for nausea or vomiting. 30 tablet 1   oxyCODONE (OXY IR/ROXICODONE) 5 MG immediate release tablet Take 1 tablet (5 mg total) by mouth every 6 (six) hours as needed for severe pain. 90 tablet 0   polyethylene glycol (MIRALAX / GLYCOLAX) 17 g packet Take 17 g by mouth daily. (Patient taking differently: Take 17 g by mouth daily as needed for mild constipation.) 14 each 0   promethazine (PHENERGAN) 25 MG tablet Take 1 tablet (25 mg total) by  mouth every 6 (six) hours as needed for nausea or vomiting. 30 tablet 2   Tetrahydrozoline HCl (VISINE OP) Place 1 drop into both eyes daily as needed (redness).     venlafaxine XR (EFFEXOR-XR) 150 MG 24 hr capsule TAKE 1 CAPSULE(150 MG) BY MOUTH DAILY WITH BREAKFAST (Patient taking differently: 150 mg at bedtime.) 30 capsule 1   No current facility-administered medications for this visit.    VITAL SIGNS: LMP 01/06/2019 (Approximate)  There were no vitals filed for this visit.  Estimated body mass index is 30.36 kg/m as calculated from the following:   Height as of 02/25/21: $RemoveBef'5\' 3"'YTywliCNyU$  (1.6 m).   Weight as of an earlier encounter on 02/27/21: 171 lb 6.4 oz (77.7 kg).  LABS: CBC:    Component Value Date/Time   WBC 9.3 02/27/2021 1014   HGB 11.2 (L) 02/27/2021 1014   HGB 13.1 06/01/2019 1340   HCT 32.7 (L) 02/27/2021 1014   PLT 303 02/27/2021 1014   PLT 322 06/01/2019 1340   MCV 99.4 02/27/2021 1014   NEUTROABS 7.6 02/27/2021 1014   LYMPHSABS 0.5 (L) 02/27/2021 1014   MONOABS 0.7 02/27/2021 1014   EOSABS 0.4 02/27/2021 1014   BASOSABS 0.0 02/27/2021 1014   Comprehensive Metabolic Panel:    Component Value Date/Time   NA 133 (L) 02/27/2021 1014   K 4.0 02/27/2021 1014   CL 99 02/27/2021 1014   CO2 31 02/27/2021 1014   BUN 9 02/27/2021 1014   CREATININE 0.56 02/27/2021 1014   CREATININE 0.59 06/01/2019 1340   GLUCOSE 96 02/27/2021 1014   CALCIUM 8.5 (L) 02/27/2021 1014   AST 35 02/27/2021 1014   AST 21 06/01/2019 1340   ALT 18 02/27/2021 1014   ALT 26 06/01/2019 1340   ALKPHOS 77 02/27/2021 1014   BILITOT 0.2 (L) 02/27/2021 1014   BILITOT 0.5 06/01/2019 1340   PROT 7.1 02/27/2021 1014   ALBUMIN 3.4 (L) 02/27/2021 1014    RADIOGRAPHIC STUDIES: DG Chest 2 View  Result Date: 02/21/2021 CLINICAL DATA:  A 41 year old female presents with shortness of breath and history of breast cancer. EXAM: CHEST - 2 VIEW COMPARISON:  Chest x-ray from October 01, 2020 and CT of the chest  from January 27, 2021. FINDINGS: Cardiomediastinal contours and hilar structures are stable. LEFT hemidiaphragm remains obscured. Signs of LEFT pleural effusion and basilar airspace process. Subtle background nodularity has increased in the RIGHT chest over time. No additional areas  of frank consolidative changes. On limited assessment there is no acute skeletal process. Evidence of prior RIGHT axillary dissection presumably projecting over the RIGHT chest. IMPRESSION: 1. Malignant effusion in the LEFT chest with further consolidation or enlarging nodules and masses in this area. Difficult to exclude developing infection in the LEFT lung base. 2. Scattered small nodular densities in the RIGHT chest appearing more conspicuous than on previous imaging may represent a combination of known metastatic disease and superimposed infection. Electronically Signed   By: Zetta Bills M.D.   On: 02/21/2021 13:22   CT Angio Chest PE W and/or Wo Contrast  Addendum Date: 02/24/2021   ADDENDUM REPORT: 02/24/2021 07:47 ADDENDUM: As stated in the original dictation, left-sided pleural metastasis, widespread pulmonary metastases and hepatic metastases all appear increased compared to prior examinations. In addition, there is consolidative airspace disease in the left lower lobe, concerning for pneumonia. Electronically Signed   By: Vinnie Langton M.D.   On: 02/24/2021 07:47   Result Date: 02/24/2021 CLINICAL DATA:  Pulmonary embolism suspected. High probability. Metastatic breast cancer. Shortness of breath. EXAM: CT ANGIOGRAPHY CHEST WITH CONTRAST TECHNIQUE: Multidetector CT imaging of the chest was performed using the standard protocol during bolus administration of intravenous contrast. Multiplanar CT image reconstructions and MIPs were obtained to evaluate the vascular anatomy. RADIATION DOSE REDUCTION: This exam was performed according to the departmental dose-optimization program which includes automated exposure  control, adjustment of the mA and/or kV according to patient size and/or use of iterative reconstruction technique. CONTRAST:  164mL OMNIPAQUE IOHEXOL 350 MG/ML SOLN COMPARISON:  Chest radiography same day.  Chest CT 01/27/2021. FINDINGS: Cardiovascular: Cardiomegaly with left ventricular prominence. No visible aortic atherosclerotic calcification. Pulmonary arterial opacification is good. There are no pulmonary emboli. Mediastinum/Nodes: Over the last month, there has been considerable growth and mediastinal tumor. Anterior mediastinal disease previously measured at 15 and 17 mm on axial image 32 now measures 17 and 20 mm. Increased tumor growth in the subcarinal region and left hilum and along the left pericardial margin. Lungs/Pleura: Marked worsening of pulmonary metastatic disease. Innumerable nodules scattered throughout the lungs measuring 9 mm in size and smaller. There is worsening collapse of the left lower lobe. Lobular pleural disease on the left has worsened. Upper Abdomen: Worsening of hepatic metastatic disease. More numerous and larger metastatic lesions. For example, metastasis at the dome of the liver on the right previously measured 18 mm in diameter and now measures 21 mm in diameter. Musculoskeletal: Lytic sclerotic metastatic disease throughout the spine and ribs appears similar. Review of the MIP images confirms the above findings. IMPRESSION: No pulmonary emboli. Progression of metastatic tumor within the mediastinum, left hilum, manifest as nodular metastatic lesions throughout both lungs, and within the liver. Worsened volume loss in the left lower lobe with progression of pleural disease. This interpretation is rendered on an emergent basis in order to rule out pulmonary emboli. The exam will be reviewed and addended by body/oncologic specialist radiologist tomorrow. Electronically Signed: By: Nelson Chimes M.D. On: 02/23/2021 21:42   CARDIAC CATHETERIZATION  Result Date: 02/25/2021    The left ventricular systolic function is normal.   LV end diastolic pressure is normal.   The left ventricular ejection fraction is 55-65% by visual estimate. Conclusion Normal left ventricular function EF of 60% Normal coronaries Intervention deferred No conclusion   DG Chest Port 1 View  Result Date: 02/24/2021 CLINICAL DATA:  Shortness of breath, chest pain EXAM: PORTABLE CHEST 1 VIEW COMPARISON:  Previous studies including  the examination of 02/23/2021 FINDINGS: Transverse diameter of heart is increased. There is opacification of left lower lung fields consistent with pleural effusion and possibly underlying infiltrate with no significant change. Are possible small nodular densities in the right mid and right lower lung fields. Right lateral CP angle is clear. There is no pneumothorax. IMPRESSION: No significant interval changes are noted in the opacification of left lower lung fields suggesting pleural effusion and possibly underlying infiltrate. Electronically Signed   By: Elmer Picker M.D.   On: 02/24/2021 15:06   DG Chest Portable 1 View  Result Date: 02/23/2021 CLINICAL DATA:  Chest pain and shortness of breath. History of metastatic breast cancer. EXAM: PORTABLE CHEST 1 VIEW COMPARISON:  02/21/2021 and prior radiograph FINDINGS: The cardiomediastinal silhouette is unchanged. LEFT LOWER lung consolidation/atelectasis and LEFT pleural thickening again noted. No new pulmonary findings are noted. There is no evidence of pneumothorax or acute bony abnormality. IMPRESSION: Unchanged appearance of the chest with LEFT LOWER lung consolidation/atelectasis and LEFT pleural thickening. Electronically Signed   By: Margarette Canada M.D.   On: 02/23/2021 20:42    PERFORMANCE STATUS (ECOG) : 1 - Symptomatic but completely ambulatory  Review of Systems Unless otherwise noted, a complete review of systems is negative.  Physical Exam General: NAD Pulmonary: Unlabored Extremities: no edema, no joint  deformities Skin: no rashes Neurological: Grossly nonfocal  IMPRESSION: I met with patient and her brother today.  Patient discharged home from the hospital yesterday after being treated for pneumonia.  CT angio of the chest on 02/23/2021 revealed left-sided pleural metastasis, widespread pulmonary and hepatic mets were increased from previous imaging.  She has stable skeletal metastases.  Patient endorses persistent and severe left chest wall/back pain.  For this, she has been managed on MS Contin 30 mg every 8 hours and oxycodone 5 mg, which patient reports that she takes about every 4 hours for breakthrough pain.  She finds that the oxycodone is more helpful than the MS Contin.  She denies severe adverse effects from pain meds although does endorse chronic constipation.  Of note, patient completed SBRT spine/ribs on 02/25/2021.  Patient reports that pain was significantly improved in the hospital with use of IV hydromorphone and Toradol.  She is interested in trying an oral NSAID.  We will start her on meloxicam.  We will leave the MS Contin/oxycodone at current dosing/frequency.  I did discuss with her possible referral to see an interventional pain specialist.  She would like to think about this option.  Patient is having opioid-induced constipation.  Bowel movement frequency is about every 4 days on MiraLAX.  I suggested that she start Senokot in addition to the MiraLAX with a target of a bowel movement every day or every other day.  Patient says that she was recently taken off Adderall by her psychiatrist.  She had been on Adderall for over 10 years but this was rotated to a regimen that she is finding significantly less effective at managing her ADHD symptoms.  She feels like this is negatively impacting her quality of life.  She also endorses anxiety not completely controlled with Effexor/alprazolam.  I called and left a message for her psychiatrist. Dr. Corena Pilgrim [place],  [place], [place] [address] Phone: [phone]  PLAN: -Continue current scope of treatment -Continue MS Contin/oxycodone -Start meloxicam 7.5 mg daily -Daily bowel regimen with MiraLAX/Senokot -Message left for patient's psychiatrist -Consider referral to interventional pain management  Case and plan discussed with Dr. Grayland Ormond  Patient expressed understanding and was in agreement with this plan. She also understands that She can call the clinic at any time with any questions, concerns, or complaints.     Time Total: 25 minutes  Visit consisted of counseling and education dealing with the complex and emotionally intense issues of symptom management and palliative care in the setting of serious and potentially life-threatening illness.Greater than 50%  of this time was spent counseling and coordinating care related to the above assessment and plan.  Signed by: Altha Harm, PhD, NP-C

## 2021-02-27 NOTE — Progress Notes (Signed)
Nutrition Assessment ° ° °Reason for Assessment:  ° °Referral from Dr Finnegan regarding poor appetite while on chemotherapy.  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  Past medical history initially diagnosed in Nov 2020, s/p right lumpectomy with adjuvant chemotherapy/radiation and recurrence while on tamoxifen.  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  S/p BSO on 02/17/21.  Patient on third line chemotherapy for breast cancer (xeloda), s/p radiation to spine/ribs.  Palliative care following. Noted recent hospital admission 2/19-2/22 for pneumonia, sepsis, NSTEMI with left heart cath which was unremarkable.   ° °Met with patient and brother in exam room.  Patient reports currently appetite has started to come back.  While on treatment reports nausea, decreased intake (1/2 bagel with cream cheese, cheese toast).  Yesterday was able to eat 3 small brownie bites, salad with bread for lunch and lasagna with salad for dinner.  Has not tried oral nutrition supplements ° ° °Medications: zofran, phenergan ° ° °Labs: reviewed ° ° °Anthropometrics:  ° °Height: 63 inches °Weight: 171 lb 6.4 oz today (likely fluid, discharged yesterday) °163 lb 2.3 oz on 2/21 °170 lb on 1/10 °168 lb on 12/10 °167 lb on 11/3 °168 lb on 10/6 °BMI: 30 ° ° °Estimated Energy Needs ° °Kcals: 1950-2300 °Protein: 98-115 g °Fluid: 1.9 L ° ° °NUTRITION DIAGNOSIS: Inadequate oral intake related to cancer related treatment side effects as evidenced by nausea and decreased intake while on treatment.  Planning to resume treatment tomorrow.  ° ° °INTERVENTION:  °Discussed strategies to help with nausea.  Handout provided on Nausea and Vomiting.   °Samples of ensure clear, ensure complete and plus, ensure max protein, orgain shakes provided today along with coupons.   °Encouraged good sources of protein.   °Recipes for shakes, smoothies provided °Contact information provided ° ° °MONITORING, EVALUATION, GOAL: weight trends, intake ° ° °Next Visit: Thursday, March 16 after MD visit ° ° B. , RD, LDN °Registered Dietitian °[phone] (mobile) ° ° ° ° ° °

## 2021-02-28 ENCOUNTER — Encounter: Payer: Self-pay | Admitting: Licensed Clinical Social Worker

## 2021-02-28 LAB — CULTURE, BLOOD (ROUTINE X 2)
Culture: NO GROWTH
Culture: NO GROWTH
Special Requests: ADEQUATE
Special Requests: ADEQUATE

## 2021-02-28 LAB — CANCER ANTIGEN 27.29: CA 27.29: 112.3 U/mL — ABNORMAL HIGH (ref 0.0–38.6)

## 2021-02-28 NOTE — Progress Notes (Signed)
[place] Work  Clinical Social Work was referred by medical provider for assessment of psychosocial needs.  Clinical Social Worker contacted patient by phone  to offer support and assess for needs.  CSW left voicemail with contact information and request for return call.   Adelene Amas, [place]       First Attempt

## 2021-03-03 ENCOUNTER — Other Ambulatory Visit: Payer: Self-pay | Admitting: Emergency Medicine

## 2021-03-03 MED ORDER — LEVOFLOXACIN 750 MG PO TABS: 750.0000 mg | ORAL_TABLET | Freq: Every day | ORAL | 0 refills | Status: DC

## 2021-03-04 ENCOUNTER — Other Ambulatory Visit (HOSPITAL_COMMUNITY): Payer: Self-pay

## 2021-03-04 NOTE — Telephone Encounter (Signed)
I spoke with the nurse from the psychiatrist's office.  Dr. Loni Muse is out of country until next week but she plans to review the information with him when he returns.

## 2021-03-05 ENCOUNTER — Other Ambulatory Visit (HOSPITAL_COMMUNITY): Payer: Self-pay

## 2021-03-05 ENCOUNTER — Telehealth: Payer: Self-pay | Admitting: Pharmacy Technician

## 2021-03-05 NOTE — Telephone Encounter (Signed)
Oral Oncology Patient Advocate Encounter ?  ?Received notification from [place] that prior authorization for Joylene Draft is required. ?  ?PA submitted on CoverMyMeds ?Key [id number] ?Status is pending ?  ?Oral Oncology Clinic will continue to follow. ? ?Dennison Nancy CPHT ?Specialty Pharmacy Patient Advocate ?[place] ?Phone [phone] ?Fax [phone] ?03/05/2021 3:53 PM ? ?

## 2021-03-05 NOTE — Telephone Encounter (Signed)
Oral Oncology Patient Advocate Encounter ? ?Prior Authorization for Kaylee Romero has been approved.   ? ?PA# [id number] ?Effective dates: 03/05/21 through 03/06/22 ? ?Patient must use [facility]. ? ?Oral Oncology Clinic will continue to follow.  ? ?Dennison Nancy CPHT ?Specialty Pharmacy Patient Advocate ?[place] ?Phone [phone] ?Fax [phone] ?03/05/2021 3:54 PM ? ?

## 2021-03-06 ENCOUNTER — Encounter: Payer: Self-pay | Admitting: Licensed Clinical Social Worker

## 2021-03-06 NOTE — Progress Notes (Signed)
[facility] CSW Progress Note ? ?Clinical Social Worker  spoke with patient  to follow-up on referral.  Patient stated she was unable to speak with CSW at this time but agreed to contact CSW tomorrow 03/07/2021 at 10:00am. ? ?Kaylee Romero , LCSW ?

## 2021-03-07 ENCOUNTER — Other Ambulatory Visit: Payer: Self-pay | Admitting: Emergency Medicine

## 2021-03-07 DIAGNOSIS — C50411 Malignant neoplasm of upper-outer quadrant of right female breast: Secondary | ICD-10-CM

## 2021-03-07 DIAGNOSIS — Z170 Estrogen receptor positive status [ER+]: Secondary | ICD-10-CM

## 2021-03-07 MED ORDER — MORPHINE SULFATE ER 15 MG PO TBCR: 30.0000 mg | EXTENDED_RELEASE_TABLET | Freq: Three times a day (TID) | ORAL | 0 refills | Status: DC

## 2021-03-08 ENCOUNTER — Other Ambulatory Visit: Payer: Self-pay | Admitting: Oncology

## 2021-03-08 DIAGNOSIS — C50411 Malignant neoplasm of upper-outer quadrant of right female breast: Secondary | ICD-10-CM

## 2021-03-09 ENCOUNTER — Other Ambulatory Visit: Payer: Self-pay

## 2021-03-09 ENCOUNTER — Inpatient Hospital Stay
Admission: EM | Admit: 2021-03-09 | Discharge: 2021-03-13 | DRG: 193 | Disposition: A | Discharge status: Home / Self Care | Payer: BC Managed Care – PPO | Attending: Internal Medicine | Admitting: Internal Medicine

## 2021-03-09 ENCOUNTER — Inpatient Hospital Stay: Payer: BC Managed Care – PPO

## 2021-03-09 ENCOUNTER — Emergency Department: Payer: BC Managed Care – PPO

## 2021-03-09 DIAGNOSIS — Z79810 Long term (current) use of selective estrogen receptor modulators (SERMs): Secondary | ICD-10-CM

## 2021-03-09 DIAGNOSIS — I10 Essential (primary) hypertension: Secondary | ICD-10-CM | POA: Diagnosis present

## 2021-03-09 DIAGNOSIS — K5900 Constipation, unspecified: Secondary | ICD-10-CM | POA: Diagnosis present

## 2021-03-09 DIAGNOSIS — I252 Old myocardial infarction: Secondary | ICD-10-CM | POA: Diagnosis not present

## 2021-03-09 DIAGNOSIS — Z888 Allergy status to other drugs, medicaments and biological substances status: Secondary | ICD-10-CM

## 2021-03-09 DIAGNOSIS — F411 Generalized anxiety disorder: Secondary | ICD-10-CM | POA: Diagnosis present

## 2021-03-09 DIAGNOSIS — C7802 Secondary malignant neoplasm of left lung: Secondary | ICD-10-CM | POA: Diagnosis present

## 2021-03-09 DIAGNOSIS — Z9221 Personal history of antineoplastic chemotherapy: Secondary | ICD-10-CM

## 2021-03-09 DIAGNOSIS — C7801 Secondary malignant neoplasm of right lung: Secondary | ICD-10-CM | POA: Diagnosis present

## 2021-03-09 DIAGNOSIS — C782 Secondary malignant neoplasm of pleura: Secondary | ICD-10-CM | POA: Diagnosis present

## 2021-03-09 DIAGNOSIS — Z8701 Personal history of pneumonia (recurrent): Secondary | ICD-10-CM | POA: Diagnosis not present

## 2021-03-09 DIAGNOSIS — J9811 Atelectasis: Secondary | ICD-10-CM | POA: Diagnosis present

## 2021-03-09 DIAGNOSIS — Z803 Family history of malignant neoplasm of breast: Secondary | ICD-10-CM

## 2021-03-09 DIAGNOSIS — Z20822 Contact with and (suspected) exposure to covid-19: Secondary | ICD-10-CM | POA: Diagnosis present

## 2021-03-09 DIAGNOSIS — C787 Secondary malignant neoplasm of liver and intrahepatic bile duct: Secondary | ICD-10-CM | POA: Diagnosis present

## 2021-03-09 DIAGNOSIS — Z8249 Family history of ischemic heart disease and other diseases of the circulatory system: Secondary | ICD-10-CM

## 2021-03-09 DIAGNOSIS — Z853 Personal history of malignant neoplasm of breast: Secondary | ICD-10-CM | POA: Diagnosis not present

## 2021-03-09 DIAGNOSIS — Z515 Encounter for palliative care: Secondary | ICD-10-CM

## 2021-03-09 DIAGNOSIS — Y95 Nosocomial condition: Secondary | ICD-10-CM | POA: Diagnosis present

## 2021-03-09 DIAGNOSIS — F32A Depression, unspecified: Secondary | ICD-10-CM | POA: Diagnosis present

## 2021-03-09 DIAGNOSIS — J9621 Acute and chronic respiratory failure with hypoxia: Secondary | ICD-10-CM | POA: Diagnosis present

## 2021-03-09 DIAGNOSIS — J910 Malignant pleural effusion: Secondary | ICD-10-CM | POA: Diagnosis present

## 2021-03-09 DIAGNOSIS — Z79899 Other long term (current) drug therapy: Secondary | ICD-10-CM

## 2021-03-09 DIAGNOSIS — C7951 Secondary malignant neoplasm of bone: Secondary | ICD-10-CM | POA: Diagnosis present

## 2021-03-09 DIAGNOSIS — F909 Attention-deficit hyperactivity disorder, unspecified type: Secondary | ICD-10-CM | POA: Diagnosis present

## 2021-03-09 DIAGNOSIS — J189 Pneumonia, unspecified organism: Principal | ICD-10-CM | POA: Diagnosis present

## 2021-03-09 DIAGNOSIS — E860 Dehydration: Secondary | ICD-10-CM | POA: Diagnosis present

## 2021-03-09 DIAGNOSIS — R112 Nausea with vomiting, unspecified: Secondary | ICD-10-CM | POA: Diagnosis present

## 2021-03-09 DIAGNOSIS — C50411 Malignant neoplasm of upper-outer quadrant of right female breast: Secondary | ICD-10-CM | POA: Diagnosis present

## 2021-03-09 DIAGNOSIS — Z170 Estrogen receptor positive status [ER+]: Secondary | ICD-10-CM

## 2021-03-09 DIAGNOSIS — G893 Neoplasm related pain (acute) (chronic): Secondary | ICD-10-CM | POA: Diagnosis present

## 2021-03-09 DIAGNOSIS — Z923 Personal history of irradiation: Secondary | ICD-10-CM

## 2021-03-09 DIAGNOSIS — Z90710 Acquired absence of both cervix and uterus: Secondary | ICD-10-CM

## 2021-03-09 DIAGNOSIS — F988 Other specified behavioral and emotional disorders with onset usually occurring in childhood and adolescence: Secondary | ICD-10-CM | POA: Diagnosis present

## 2021-03-09 DIAGNOSIS — Z87891 Personal history of nicotine dependence: Secondary | ICD-10-CM

## 2021-03-09 LAB — CBC WITH DIFFERENTIAL/PLATELET
Abs Immature Granulocytes: 0.15 10*3/uL — ABNORMAL HIGH (ref 0.00–0.07)
Basophils Absolute: 0 10*3/uL (ref 0.0–0.1)
Basophils Relative: 0 %
Eosinophils Absolute: 0 10*3/uL (ref 0.0–0.5)
Eosinophils Relative: 0 %
HCT: 39.8 % (ref 36.0–46.0)
Hemoglobin: 13.4 g/dL (ref 12.0–15.0)
Immature Granulocytes: 1 %
Lymphocytes Relative: 5 %
Lymphs Abs: 0.7 10*3/uL (ref 0.7–4.0)
MCH: 32.4 pg (ref 26.0–34.0)
MCHC: 33.7 g/dL (ref 30.0–36.0)
MCV: 96.4 fL (ref 80.0–100.0)
Monocytes Absolute: 0.5 10*3/uL (ref 0.1–1.0)
Monocytes Relative: 4 %
Neutro Abs: 13 10*3/uL — ABNORMAL HIGH (ref 1.7–7.7)
Neutrophils Relative %: 90 %
Platelets: 348 10*3/uL (ref 150–400)
RBC: 4.13 MIL/uL (ref 3.87–5.11)
RDW: 13.6 % (ref 11.5–15.5)
WBC: 14.4 10*3/uL — ABNORMAL HIGH (ref 4.0–10.5)
nRBC: 0 % (ref 0.0–0.2)

## 2021-03-09 LAB — COMPREHENSIVE METABOLIC PANEL
ALT: 26 U/L (ref 0–44)
AST: 95 U/L — ABNORMAL HIGH (ref 15–41)
Albumin: 3.5 g/dL (ref 3.5–5.0)
Alkaline Phosphatase: 78 U/L (ref 38–126)
Anion gap: 14 (ref 5–15)
BUN: 14 mg/dL (ref 6–20)
CO2: 29 mmol/L (ref 22–32)
Calcium: 13.1 mg/dL (ref 8.9–10.3)
Chloride: 97 mmol/L — ABNORMAL LOW (ref 98–111)
Creatinine, Ser: 0.7 mg/dL (ref 0.44–1.00)
GFR, Estimated: >60 mL/min (ref >60)
Glucose, Bld: 125 mg/dL — ABNORMAL HIGH (ref 70–99)
Potassium: 3.8 mmol/L (ref 3.5–5.1)
Sodium: 140 mmol/L (ref 135–145)
Total Bilirubin: 0.5 mg/dL (ref 0.3–1.2)
Total Protein: 7.2 g/dL (ref 6.5–8.1)

## 2021-03-09 LAB — TROPONIN I (HIGH SENSITIVITY)
Troponin I (High Sensitivity): 52 ng/L — ABNORMAL HIGH (ref <18)
Troponin I (High Sensitivity): 49 ng/L — ABNORMAL HIGH (ref <18)

## 2021-03-09 LAB — LACTIC ACID, PLASMA
Lactic Acid, Venous: 2.3 mmol/L (ref 0.5–1.9)
Lactic Acid, Venous: 2.3 mmol/L (ref 0.5–1.9)

## 2021-03-09 LAB — URINALYSIS, ROUTINE W REFLEX MICROSCOPIC
Bilirubin Urine: NEGATIVE
Glucose, UA: NEGATIVE mg/dL
Hgb urine dipstick: NEGATIVE
Ketones, ur: NEGATIVE mg/dL
Leukocytes,Ua: NEGATIVE
Nitrite: NEGATIVE
Protein, ur: NEGATIVE mg/dL
Specific Gravity, Urine: 1.011 (ref 1.005–1.030)
pH: 8 (ref 5.0–8.0)

## 2021-03-09 LAB — RESP PANEL BY RT-PCR (FLU A&B, COVID) ARPGX2
Influenza A by PCR: NEGATIVE
Influenza B by PCR: NEGATIVE
SARS Coronavirus 2 by RT PCR: NEGATIVE

## 2021-03-09 LAB — LIPASE, BLOOD: Lipase: 22 U/L (ref 11–51)

## 2021-03-09 IMAGING — CR DG CHEST 2V
1 series · 2 of 2 positions shown · non-contrast
Comparison: Chest radiograph dated [DATE] and CT
examination dated [DATE]

CLINICAL DATA: Shortness of breath

EXAM:
CHEST - 2 VIEW

[Series 1: dg chest 2 view · 0.14mm/px · 2 of 2 slices shown]
[im 1/2]
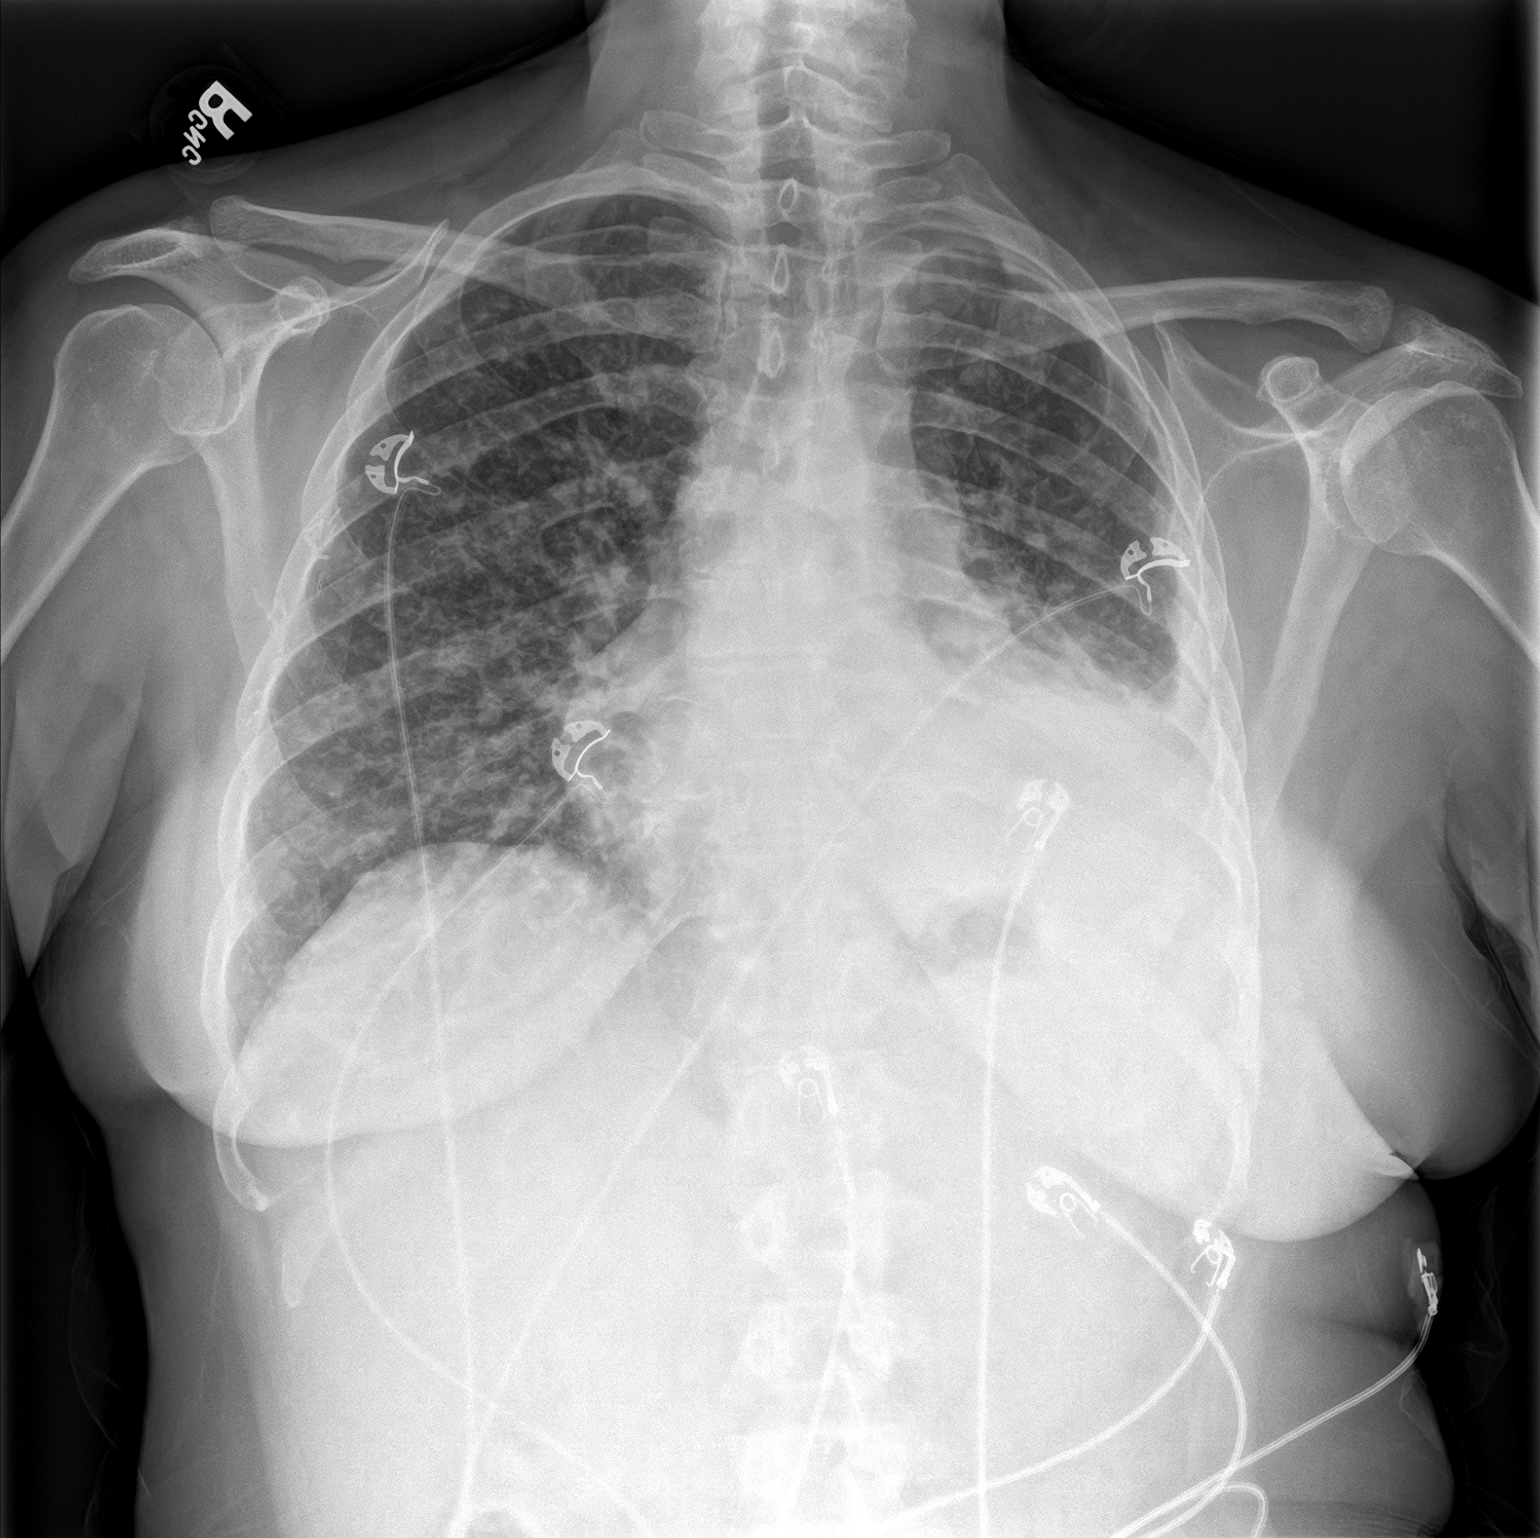
[im 2/2]
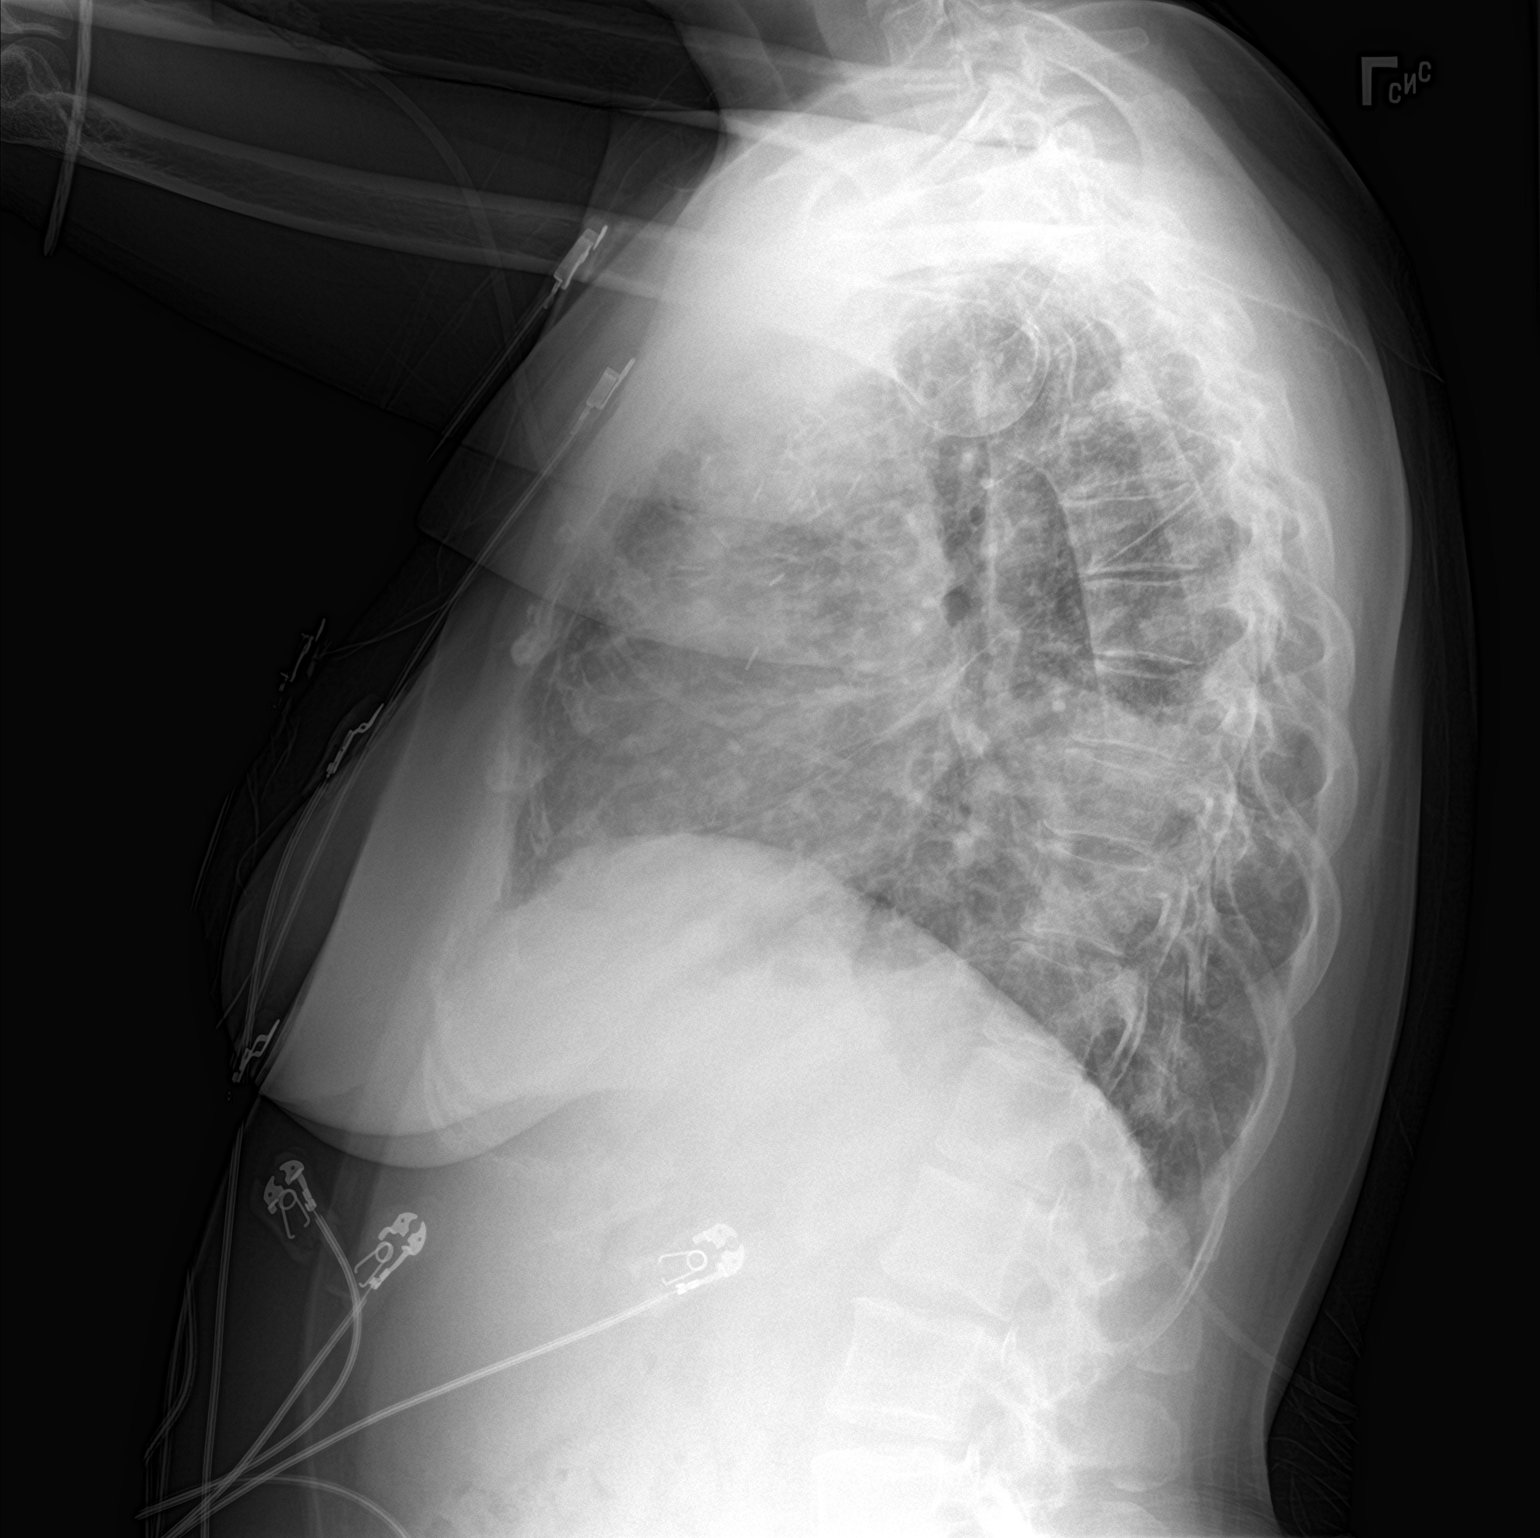

[2 of 2 positions shown; findings below may reference images not displayed]

FINDINGS: The heart is enlarged. Left basilar opacity which may represent
atelectasis or infiltrate, unchanged. Multiple bilateral nodular
opacities consistent with patient's known metastatic disease is
unchanged. No appreciable pneumothorax. No acute osseous
abnormality.
IMPRESSION: 1.  Stable cardiomegaly.

2. Left basilar opacity which may represent atelectasis, infiltrate
or small effusion, unchanged.

3. Numerous bilateral nodular pulmonary opacities consistent with
metastatic disease.

## 2021-03-09 MED ORDER — VANCOMYCIN HCL 1750 MG/350ML IV SOLN
1750.0000 mg | Freq: Once | INTRAVENOUS | Status: AC
Start: 2021-03-09 — End: 2021-03-09
  Administered 2021-03-09: 1750 mg via INTRAVENOUS
  Filled 2021-03-09: qty 350

## 2021-03-09 MED ORDER — ATOMOXETINE HCL 10 MG PO CAPS: 40.0000 mg | ORAL_CAPSULE | Freq: Every day | ORAL | Status: DC

## 2021-03-09 MED ORDER — SODIUM CHLORIDE 0.9 % IV SOLN
25.0000 mg | Freq: Once | INTRAVENOUS | Status: AC
  Administered 2021-03-09: 25 mg via INTRAVENOUS
  Filled 2021-03-09: qty 1

## 2021-03-09 MED ORDER — ENOXAPARIN SODIUM 40 MG/0.4ML IJ SOSY
40.0000 mg | PREFILLED_SYRINGE | INTRAMUSCULAR | Status: DC
  Administered 2021-03-09 – 2021-03-12 (×4): 40 mg via SUBCUTANEOUS
  Filled 2021-03-09 (×4): qty 0.4

## 2021-03-09 MED ORDER — FUROSEMIDE 10 MG/ML IJ SOLN
40.0000 mg | Freq: Every day | INTRAMUSCULAR | Status: DC
Start: 2021-03-09 — End: 2021-03-12
  Administered 2021-03-09 – 2021-03-12 (×4): 40 mg via INTRAVENOUS
  Filled 2021-03-09 (×4): qty 4

## 2021-03-09 MED ORDER — GUANFACINE HCL ER 1 MG PO TB24: 2.0000 mg | ORAL_TABLET | Freq: Every day | ORAL | Status: DC

## 2021-03-09 MED ORDER — ACETAMINOPHEN 500 MG PO TABS
1000.0000 mg | ORAL_TABLET | Freq: Four times a day (QID) | ORAL | Status: DC | PRN
  Administered 2021-03-10 (×2): 1000 mg via ORAL
  Filled 2021-03-09 (×2): qty 2

## 2021-03-09 MED ORDER — SODIUM CHLORIDE 0.9 % IV SOLN: INTRAVENOUS | Status: DC

## 2021-03-09 MED ORDER — VENLAFAXINE HCL ER 150 MG PO CP24
150.0000 mg | ORAL_CAPSULE | Freq: Every day | ORAL | Status: DC
Start: 2021-03-09 — End: 2021-03-10
  Filled 2021-03-09: qty 1

## 2021-03-09 MED ORDER — ONDANSETRON HCL 4 MG PO TABS: 4.0000 mg | ORAL_TABLET | Freq: Four times a day (QID) | ORAL | Status: DC | PRN

## 2021-03-09 MED ORDER — GUAIFENESIN 100 MG/5ML PO LIQD
5.0000 mL | ORAL | Status: DC | PRN
  Administered 2021-03-12 – 2021-03-13 (×3): 5 mL via ORAL
  Filled 2021-03-09 (×3): qty 10

## 2021-03-09 MED ORDER — SODIUM CHLORIDE 0.9 % IV SOLN
2.0000 g | Freq: Once | INTRAVENOUS | Status: AC
  Administered 2021-03-09: 2 g via INTRAVENOUS
  Filled 2021-03-09: qty 2

## 2021-03-09 MED ORDER — ZOLEDRONIC ACID 4 MG/100ML IV SOLN
4.0000 mg | Freq: Once | INTRAVENOUS | Status: DC
  Filled 2021-03-09: qty 100

## 2021-03-09 MED ORDER — ALPRAZOLAM 0.25 MG PO TABS
0.2500 mg | ORAL_TABLET | Freq: Two times a day (BID) | ORAL | Status: DC | PRN
  Administered 2021-03-10 – 2021-03-13 (×4): 0.25 mg via ORAL
  Filled 2021-03-09 (×4): qty 1

## 2021-03-09 MED ORDER — HYDROMORPHONE HCL 1 MG/ML IJ SOLN
0.5000 mg | Freq: Once | INTRAMUSCULAR | Status: AC
  Administered 2021-03-09: 0.5 mg via INTRAVENOUS
  Filled 2021-03-09: qty 1

## 2021-03-09 MED ORDER — CEFTRIAXONE SODIUM 1 G IJ SOLR
1.0000 g | INTRAMUSCULAR | Status: DC
  Administered 2021-03-09: 1 g via INTRAVENOUS
  Filled 2021-03-09: qty 10

## 2021-03-09 MED ORDER — SODIUM CHLORIDE 0.9 % IV SOLN
500.0000 mg | INTRAVENOUS | Status: DC
  Administered 2021-03-09: 500 mg via INTRAVENOUS
  Filled 2021-03-09 (×2): qty 5

## 2021-03-09 MED ORDER — VANCOMYCIN HCL IN DEXTROSE 1-5 GM/200ML-% IV SOLN
1000.0000 mg | Freq: Once | INTRAVENOUS | Status: DC
  Filled 2021-03-09: qty 200

## 2021-03-09 MED ORDER — HYDROMORPHONE HCL 1 MG/ML IJ SOLN
1.0000 mg | INTRAMUSCULAR | Status: DC
  Administered 2021-03-09 – 2021-03-10 (×5): 1 mg via INTRAVENOUS
  Filled 2021-03-09 (×6): qty 1

## 2021-03-09 MED ORDER — METOPROLOL TARTRATE 25 MG PO TABS: 25.0000 mg | ORAL_TABLET | Freq: Two times a day (BID) | ORAL | Status: DC

## 2021-03-09 MED ORDER — LACTATED RINGERS IV SOLN: INTRAVENOUS | Status: DC

## 2021-03-09 MED ORDER — ONDANSETRON HCL 4 MG/2ML IJ SOLN
4.0000 mg | Freq: Once | INTRAMUSCULAR | Status: AC
  Administered 2021-03-09: 4 mg via INTRAVENOUS
  Filled 2021-03-09: qty 2

## 2021-03-09 MED ORDER — SODIUM CHLORIDE 0.9 % IV SOLN
25.0000 mg | Freq: Once | INTRAVENOUS | Status: DC
  Filled 2021-03-09: qty 1

## 2021-03-09 MED ORDER — SODIUM CHLORIDE 0.9 % IV BOLUS
1000.0000 mL | Freq: Once | INTRAVENOUS | Status: AC
  Administered 2021-03-09: 1000 mL via INTRAVENOUS

## 2021-03-09 MED ORDER — ONDANSETRON HCL 4 MG/2ML IJ SOLN
4.0000 mg | Freq: Four times a day (QID) | INTRAMUSCULAR | Status: DC | PRN
  Administered 2021-03-09 – 2021-03-12 (×2): 4 mg via INTRAVENOUS
  Filled 2021-03-09 (×2): qty 2

## 2021-03-09 MED ORDER — MORPHINE SULFATE ER 30 MG PO TBCR: 30.0000 mg | EXTENDED_RELEASE_TABLET | Freq: Three times a day (TID) | ORAL | Status: DC

## 2021-03-09 MED ORDER — OXYCODONE HCL 5 MG PO TABS
5.0000 mg | ORAL_TABLET | Freq: Four times a day (QID) | ORAL | Status: DC | PRN
  Administered 2021-03-09: 5 mg via ORAL
  Filled 2021-03-09: qty 1

## 2021-03-09 MED ORDER — DIPHENHYDRAMINE HCL 50 MG/ML IJ SOLN
12.5000 mg | Freq: Three times a day (TID) | INTRAMUSCULAR | Status: DC
  Administered 2021-03-09 – 2021-03-10 (×3): 12.5 mg via INTRAVENOUS
  Filled 2021-03-09 (×4): qty 1

## 2021-03-09 MED ORDER — ZOLEDRONIC ACID 4 MG/5ML IV CONC
4.0000 mg | Freq: Once | INTRAVENOUS | Status: AC
  Administered 2021-03-09: 4 mg via INTRAVENOUS
  Filled 2021-03-09: qty 5

## 2021-03-09 MED ORDER — METOPROLOL TARTRATE 25 MG PO TABS
12.5000 mg | ORAL_TABLET | Freq: Two times a day (BID) | ORAL | Status: DC
Start: 2021-03-09 — End: 2021-03-14
  Administered 2021-03-09 – 2021-03-13 (×8): 12.5 mg via ORAL
  Filled 2021-03-09 (×8): qty 1

## 2021-03-09 MED ORDER — HYDROMORPHONE HCL 1 MG/ML IJ SOLN
1.0000 mg | INTRAMUSCULAR | Status: DC | PRN
  Administered 2021-03-09: 1 mg via INTRAVENOUS
  Filled 2021-03-09: qty 1

## 2021-03-09 MED ORDER — IPRATROPIUM-ALBUTEROL 0.5-2.5 (3) MG/3ML IN SOLN
3.0000 mL | RESPIRATORY_TRACT | Status: DC | PRN
  Administered 2021-03-10 – 2021-03-13 (×6): 3 mL via RESPIRATORY_TRACT
  Filled 2021-03-09 (×5): qty 3

## 2021-03-09 NOTE — Assessment & Plan Note (Addendum)
Secondary to known widespread metastatic disease ?Plan: ?--pain management per oncology ? ? ?

## 2021-03-09 NOTE — Consult Note (Signed)
PULMONOLOGY         Date: 03/09/2021,   MRN# [id number] Kaylee Romero 22-Aug-1980     AdmissionWeight: 77 kg                 CurrentWeight: 77 kg   Referring physician: Dr Francine Graven   CHIEF COMPLAINT:   Recurrent malignant pleural effusion   HISTORY OF PRESENT ILLNESS   This is a 41 year old unfortunate patient with history of breast cancer and multiple additional comorbid conditions, evaluated by me last year for dyspnea found to have pleural effusion.  Initial thoracentesis done June 2022 was negative for atypia, patient subsequently had surgical biopsy of area of PET avid abnormal pleural thickening showing PR and ER positive non-small cell CA. recently patient did develop left lower lobe opacifications which was deemed to be pneumonia status posttreatment with levofloxacin and subsequent transient improvement followed by worsening and readmission showing reaccumulation of left pleural effusion.  On arrival symptoms included cough and dyspnea patient was placed on empiric antimicrobials for community-acquired pneumonia with cefepime and vancomycin.  CT chest was performed which I independently reviewed showing innumerable metastatic implants bilaterally in the lungs as well as previously noted abnormal thickening of the pleura, left lower lobe consolidation with severe narrowing of the left lower lobe bronchioles.  Blood work including CMP shows significant hypercalcemia at 13.1. Pulmonary consultation placed for additional evaluation management.     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    Malignant neoplasm of upper-outer quadrant of right breast in female, estrogen receptor positive [facility])    oncologist--- dr Grayland Ormond;;  first dx 09/ 2020 s/p right lumpectomy w/ node dissection and completed chemoradiation 08/ 2021;;  recurrent 07/ 2022 to lung pleura,liver, and thoracic spine   SOB (shortness of breath)    02-12-2021  pt denies with regular activity but sob with stairs     SURGICAL HISTORY   Past Surgical History:  Procedure Laterality Date   ABDOMINAL HYSTERECTOMY N/A 02/17/2021   BREAST LUMPECTOMY WITH AXILLARY LYMPH NODE BIOPSY Right 11/21/2018   Procedure: RIGHT BREAST LUMPECTOMY WITH SENTINEL LYMPH NODE BIOPSY;  Surgeon: Jovita Kussmaul, MD;  Location: [place];  Service: General;  Laterality: Right;   BREAST REDUCTION SURGERY Bilateral 11/21/2018   Procedure: BILATERAL MAMMARY REDUCTION  (BREAST);  Surgeon: Wallace Going, DO;  Location: [place];  Service: Plastics;  Laterality: Bilateral;   CHEST TUBE INSERTION Left 07/29/2020   Procedure: INSERTION PLEURAL DRAINAGE CATHETER;  Surgeon: Lajuana Matte, MD;  Location: [place];  Service: Thoracic;  Laterality: Left;   IRRIGATION AND DEBRIDEMENT STERNOCLAVICULAR JOINT-STERNUM AND RIBS N/A 08/13/2020   Procedure: IRRIGATION AND DEBRIDEMENT CHEST WALL ABSCESS;  Surgeon: Lajuana Matte, MD;  Location: [place] [facility];  Service: Cardiothoracic;  Laterality: N/A;   IUD REMOVAL N/A 02/17/2021   Procedure: INTRAUTERINE DEVICE (IUD) REMOVAL;  Surgeon: Linda Hedges, DO;  Location: [place];  Service: Gynecology;  Laterality: N/A;   LAPAROSCOPIC SALPINGO OOPHERECTOMY Bilateral 02/17/2021   Procedure: LAPAROSCOPIC BILATERAL SALPINGO OOPHORECTOMY;  Surgeon: Linda Hedges, DO;  Location: [facility];  Service: Gynecology;  Laterality: Bilateral;   LEFT HEART CATH AND CORONARY ANGIOGRAPHY N/A 02/25/2021   Procedure: LEFT HEART CATH AND CORONARY ANGIOGRAPHY possible PCI and stent;  Surgeon:  Yolonda Kida, MD;  Location: [place] [facility];  Service: Cardiovascular;  Laterality: N/A;   PLEURAL BIOPSY Left 07/29/2020   Procedure: PLEURAL BIOPSY;  Surgeon: Lajuana Matte, MD;  Location: [place];  Service: Thoracic;  Laterality: Left;   PLEURAL EFFUSION DRAINAGE Left 07/29/2020   Procedure: DRAINAGE OF PLEURAL EFFUSION;  Surgeon: Lajuana Matte, MD;  Location: [place] [facility];  Service: Thoracic;  Laterality: Left;   PORT-A-CATH REMOVAL N/A 07/06/2019   Procedure: REMOVAL PORT-A-CATH;  Surgeon: Jovita Kussmaul, MD;  Location: [place];  Service: General;  Laterality: N/A;   PORTACATH PLACEMENT N/A 11/21/2018   Procedure: INSERTION LEFT PORT-A-CATH WITH ULTRASOUND GUIDANCE;  Surgeon: Jovita Kussmaul, MD;  Location: [place];  Service: General;  Laterality: N/A;   TONSILLECTOMY  1987   VIDEO ASSISTED THORACOSCOPY Left 07/29/2020   Procedure: VIDEO ASSISTED THORACOSCOPY;  Surgeon: Lajuana Matte, MD;  Location: [place];  Service: Thoracic;  Laterality: Left;   WISDOM TOOTH EXTRACTION       FAMILY HISTORY   Family History  Problem Relation Age of Onset   Hypertension Father    Alcohol abuse Paternal Grandfather    Heart disease Paternal Grandfather    Alcohol abuse Maternal Grandfather    Heart disease Maternal Grandmother    Breast cancer Other      SOCIAL HISTORY   Social History   Tobacco Use   Smoking status: Former    Packs/day: 0.50    Years: 20.00    Pack years: 10.00    Types: Cigarettes    Start date: 01/13/2002    Quit date: 10/17/2016    Years since quitting: 4.3   Smokeless tobacco: Never  Vaping Use   Vaping Use: Never used  Substance Use Topics   Alcohol use: Yes    Comment: Socially   Drug use: Never     MEDICATIONS    Home Medication:  Current Outpatient Rx   Order #: [id number] Class: Historical Med   Order #: [id number] Class: Normal   Order #: [id number] Class: Print   Order #: [id number] Class:  Historical Med   Order #: [id number] Class: Historical Med   Order #: [id number] Class: Normal   Order #: [id number] Class: Historical Med   Order #: [id number] Class: Historical Med   Order #: [id number] Class: Normal   Order #: [id number] Class: Normal   Order #: [id number] Class: Normal   Order #: [id number] Class: Normal   Order #: [id number] Class: Normal   Order #: [id number] Class: Normal   Order #: [id number] Class: Normal   Order #: [id number] Class: Normal   Order #: [id number] Class: Historical Med   Order #: [id number] Class: Normal   Order #: [id number] Class: Historical Med   Order #: [id number] Class: Historical Med   Order #: [id number] Class: Normal   Order #: [id number] Class: Historical Med   Order #: [id number] Class: Normal   Order #: [id number] Class: Normal    Current Medication:  Current Facility-Administered Medications:    0.9 %  sodium chloride infusion, , Intravenous, Continuous, Agbata, Tochukwu, MD   acetaminophen (TYLENOL) tablet 1,000 mg, 1,000 mg, Oral, Q6H PRN, Agbata, Tochukwu, MD   ALPRAZolam (XANAX) tablet 0.25 mg, 0.25 mg, Oral, BID PRN, Agbata, Tochukwu, MD   azithromycin (ZITHROMAX) 500 mg in sodium chloride 0.9 % 250 mL IVPB, 500  mg, Intravenous, Q24H, Agbata, Tochukwu, MD   cefTRIAXone (ROCEPHIN) 1 g in sodium chloride 0.9 % 100 mL IVPB, 1 g, Intravenous, Q24H, Agbata, Tochukwu, MD   enoxaparin (LOVENOX) injection 40 mg, 40 mg, Subcutaneous, Q24H, Agbata, Tochukwu, MD   guaiFENesin (ROBITUSSIN) 100 MG/5ML liquid 5 mL, 5 mL, Oral, Q4H PRN, Agbata, Tochukwu, MD   HYDROmorphone (DILAUDID) injection 1 mg, 1 mg, Intravenous, Q4H PRN, Agbata, Tochukwu, MD, 1 mg at 03/09/21 1520   ipratropium-albuterol (DUONEB) 0.5-2.5 (3) MG/3ML nebulizer solution 3 mL, 3 mL, Nebulization, Q4H PRN, Agbata, Tochukwu, MD   metoprolol tartrate (LOPRESSOR) tablet 25 mg, 25 mg, Oral, BID, Agbata, Tochukwu, MD   ondansetron (ZOFRAN) tablet 4 mg, 4 mg, Oral, Q6H PRN **OR**  ondansetron (ZOFRAN) injection 4 mg, 4 mg, Intravenous, Q6H PRN, Agbata, Tochukwu, MD   promethazine (PHENERGAN) 25 mg in sodium chloride 0.9 % 50 mL IVPB, 25 mg, Intravenous, Once, Wynelle Cleveland, RPH   venlafaxine XR (EFFEXOR-XR) 24 hr capsule 150 mg, 150 mg, Oral, QHS, Agbata, Tochukwu, MD   zoledronic acid (ZOMETA) 4 mg in sodium chloride 0.9 % 100 mL IVPB, 4 mg, Intravenous, Once, Agbata, Tochukwu, MD  Current Outpatient Medications:    acetaminophen (TYLENOL) 500 MG tablet, Take 1,000 mg by mouth every 6 (six) hours as needed., Disp: , Rfl:    albuterol (VENTOLIN HFA) 108 (90 Base) MCG/ACT inhaler, INHALE 2 PUFFS INTO THE LUNGS EVERY 6 HOURS AS NEEDED FOR WHEEZING OR SHORTNESS OF BREATH (Patient taking differently: Inhale 2 puffs into the lungs 2 (two) times daily.), Disp: 6.7 g, Rfl: 2   ALPRAZolam (XANAX) 0.25 MG tablet, Take 1 tablet (0.25 mg total) by mouth 2 (two) times daily as needed for anxiety., Disp: 60 tablet, Rfl: 0   amphetamine-dextroamphetamine (ADDERALL XR) 30 MG 24 hr capsule, Take 30 mg by mouth every morning., Disp: , Rfl:    Calcium Citrate-Vitamin D (CALCIUM + D PO), Take 1 tablet by mouth 3 (three) times daily., Disp: , Rfl:    capecitabine (XELODA) 500 MG tablet, TAKE 4 TABLETS BY MOUTH 2 TIMES DAILY AFTER A MEAL FOR 14 DAYS, THEN HOLD FOR 7 DAYS, REPEAT EVERY 21 DAYS., Disp: 112 tablet, Rfl: 0   Cholecalciferol (VITAMIN D3) 50 MCG (2000 UT) TABS, Take 1 tablet by mouth daily at 12 noon., Disp: , Rfl:    ibuprofen (ADVIL) 200 MG tablet, Take 400 mg by mouth every 6 (six) hours as needed for mild pain., Disp: , Rfl:    levofloxacin (LEVAQUIN) 750 MG tablet, Take 1 tablet (750 mg total) by mouth daily for 7 days., Disp: 7 tablet, Rfl: 0   meloxicam (MOBIC) 7.5 MG tablet, Take 1 tablet (7.5 mg total) by mouth daily., Disp: 30 tablet, Rfl: 0   metoprolol tartrate (LOPRESSOR) 25 MG tablet, Take 1 tablet (25 mg total) by mouth 2 (two) times daily., Disp: 60  tablet, Rfl: 1   morphine (MS CONTIN) 15 MG 12 hr tablet, Take 2 tablets (30 mg total) by mouth every 8 (eight) hours., Disp: 90 tablet, Rfl: 0   ondansetron (ZOFRAN) 8 MG tablet, Take 1 tablet (8 mg total) by mouth every 8 (eight) hours as needed for nausea or vomiting., Disp: 30 tablet, Rfl: 1   oxyCODONE (OXY IR/ROXICODONE) 5 MG immediate release tablet, Take 1 tablet (5 mg total) by mouth every 6 (six) hours as needed for severe pain., Disp: 90 tablet, Rfl: 0   polyethylene glycol (MIRALAX / GLYCOLAX) 17 g packet, Take 17 g by  mouth daily. (Patient taking differently: Take 17 g by mouth daily as needed for mild constipation.), Disp: 14 each, Rfl: 0   promethazine (PHENERGAN) 25 MG tablet, Take 1 tablet (25 mg total) by mouth every 6 (six) hours as needed for nausea or vomiting., Disp: 30 tablet, Rfl: 2   Tetrahydrozoline HCl (VISINE OP), Place 1 drop into both eyes daily as needed (redness)., Disp: , Rfl:    venlafaxine XR (EFFEXOR-XR) 150 MG 24 hr capsule, TAKE 1 CAPSULE(150 MG) BY MOUTH DAILY WITH BREAKFAST (Patient taking differently: 150 mg at bedtime.), Disp: 30 capsule, Rfl: 1   atomoxetine (STRATTERA) 40 MG capsule, Take 40 mg by mouth daily. (Patient not taking: Reported on 03/09/2021), Disp: , Rfl:    docusate sodium (COLACE) 100 MG capsule, Take 100 mg by mouth 2 (two) times daily as needed for mild constipation. (Patient not taking: Reported on 03/09/2021), Disp: , Rfl:    guaiFENesin (ROBITUSSIN) 100 MG/5ML liquid, Take 5 mLs by mouth every 4 (four) hours as needed for cough or to loosen phlegm. (Patient not taking: Reported on 03/09/2021), Disp: 120 mL, Rfl: 0   guanFACINE (INTUNIV) 2 MG TB24 ER tablet, Take 2 mg by mouth daily. (Patient not taking: Reported on 03/09/2021), Disp: , Rfl:    ipratropium-albuterol (DUONEB) 0.5-2.5 (3) MG/3ML SOLN, Take 3 mLs by nebulization every 4 (four) hours as needed. (Patient not taking: Reported on 03/09/2021), Disp: 360 mL, Rfl: 0    prochlorperazine (COMPAZINE) 10 MG tablet, Take 1 tablet (10 mg total) by mouth 2 (two) times daily. Take 30 to 60 minutes prior to capecitabine (Xeloda) dose. (Patient not taking: Reported on 03/09/2021), Disp: 30 tablet, Rfl: 2    ALLERGIES   Betadine [povidone-iodine]     REVIEW OF SYSTEMS    Review of Systems:  Gen:  Denies  fever, sweats, chills weigh loss  HEENT: Denies blurred vision, double vision, ear pain, eye pain, hearing loss, nose bleeds, sore throat Cardiac:  No dizziness, chest pain or heaviness, chest tightness,edema Resp:   Denies cough or sputum porduction, shortness of breath,wheezing, hemoptysis,  Gi: Denies swallowing difficulty, stomach pain, nausea or vomiting, diarrhea, constipation, bowel incontinence Gu:  Denies bladder incontinence, burning urine Ext:   Denies Joint pain, stiffness or swelling Skin: Denies  skin rash, easy bruising or bleeding or hives Endoc:  Denies polyuria, polydipsia , polyphagia or weight change Psych:   Denies depression, insomnia or hallucinations   Other:  All other systems negative   VS: BP 134/85    Pulse 99    Temp 97.8 F (36.6 C) (Oral)    Resp 19    Ht $R'5\' 3"'ky$  (1.6 m)    Wt 77 kg    LMP 01/06/2019 (Approximate)    SpO2 98%    BMI 30.07 kg/m      PHYSICAL EXAM    GENERAL:NAD, no fevers, chills, no weakness no fatigue HEAD: Normocephalic, atraumatic.  EYES: Pupils equal, round, reactive to light. Extraocular muscles intact. No scleral icterus.  MOUTH: Moist mucosal membrane. Dentition intact. No abscess noted.  EAR, NOSE, THROAT: Clear without exudates. No external lesions.  NECK: Supple. No thyromegaly. No nodules. No JVD.  PULMONARY: Clear to auscultation with decreased air entry bilaterlly CARDIOVASCULAR: S1 and S2. Regular rate and rhythm. No murmurs, rubs, or gallops. No edema. Pedal pulses 2+ bilaterally.  GASTROINTESTINAL: Soft, nontender, nondistended. No masses. Positive bowel sounds. No hepatosplenomegaly.   MUSCULOSKELETAL: No swelling, clubbing, or edema. Range of motion full in all extremities.  NEUROLOGIC: Cranial nerves II through XII are intact. No gross focal neurological deficits. Sensation intact. Reflexes intact.  SKIN: No ulceration, lesions, rashes, or cyanosis. Skin warm and dry. Turgor intact.  PSYCHIATRIC: Mood, affect within normal limits. The patient is awake, alert and oriented x 3. Insight, judgment intact.       IMAGING    DG Chest 2 View  Result Date: 03/09/2021 CLINICAL DATA:  Shortness of breath EXAM: CHEST - 2 VIEW COMPARISON:  Chest radiograph dated February 24, 2021 and CT examination dated February 23, 2021 FINDINGS: The heart is enlarged. Left basilar opacity which may represent atelectasis or infiltrate, unchanged. Multiple bilateral nodular opacities consistent with patient's known metastatic disease is unchanged. No appreciable pneumothorax. No acute osseous abnormality. IMPRESSION: 1.  Stable cardiomegaly. 2. Left basilar opacity which may represent atelectasis, infiltrate or small effusion, unchanged. 3. Numerous bilateral nodular pulmonary opacities consistent with metastatic disease. Electronically Signed   By: Keane Police D.O.   On: 03/09/2021 11:06   DG Chest 2 View  Result Date: 02/21/2021 CLINICAL DATA:  A 41 year old female presents with shortness of breath and history of breast cancer. EXAM: CHEST - 2 VIEW COMPARISON:  Chest x-ray from October 01, 2020 and CT of the chest from January 27, 2021. FINDINGS: Cardiomediastinal contours and hilar structures are stable. LEFT hemidiaphragm remains obscured. Signs of LEFT pleural effusion and basilar airspace process. Subtle background nodularity has increased in the RIGHT chest over time. No additional areas of frank consolidative changes. On limited assessment there is no acute skeletal process. Evidence of prior RIGHT axillary dissection presumably projecting over the RIGHT chest. IMPRESSION: 1. Malignant effusion  in the LEFT chest with further consolidation or enlarging nodules and masses in this area. Difficult to exclude developing infection in the LEFT lung base. 2. Scattered small nodular densities in the RIGHT chest appearing more conspicuous than on previous imaging may represent a combination of known metastatic disease and superimposed infection. Electronically Signed   By: Zetta Bills M.D.   On: 02/21/2021 13:22   CT CHEST WO CONTRAST  Result Date: 03/09/2021 CLINICAL DATA:  Pneumonia, shortness of breath EXAM: CT CHEST WITHOUT CONTRAST TECHNIQUE: Multidetector CT imaging of the chest was performed following the standard protocol without IV contrast. RADIATION DOSE REDUCTION: This exam was performed according to the departmental dose-optimization program which includes automated exposure control, adjustment of the mA and/or kV according to patient size and/or use of iterative reconstruction technique. COMPARISON:  CT chest angiogram, 02/23/2021, CT chest abdomen pelvis, 01/27/2021 FINDINGS: Cardiovascular: No significant vascular findings. Normal heart size. No pericardial effusion. Mediastinum/Nodes: No enlarged mediastinal, hilar, or axillary lymph nodes. Thyroid gland, trachea, and esophagus demonstrate no significant findings. Lungs/Pleura: Redemonstrated, widespread pulmonary and pleural nodularity, pleural disease more extensive on the left, which appears slightly increased compared to prior examination dated 02/23/2021. There is similar dense consolidation and atelectasis of the inferior left lower lobe and lingula with a small left pleural effusion. Upper Abdomen: No acute abnormality. Multiple hypodense hepatic metastases and multiple peritoneal nodules, not appreciably changed. Musculoskeletal: No chest wall abnormality. Unchanged lytic metastasis of the T10 vertebral body (series 6, image 113). IMPRESSION: 1. Redemonstrated, widespread bilateral pulmonary and pleural metastatic disease, which  appears slightly worsened compared to prior examination dated 02/23/2021. 2. There is similar dense consolidation and atelectasis of the inferior left lower lobe and lingula with a small left pleural effusion. This is worrisome for infection or aspiration. 3. Additional hepatic, peritoneal, and osseous metastatic disease, not obviously  changed as partially imaged. Electronically Signed   By: Delanna Ahmadi M.D.   On: 03/09/2021 13:17   CT Angio Chest PE W and/or Wo Contrast  Addendum Date: 02/24/2021   ADDENDUM REPORT: 02/24/2021 07:47 ADDENDUM: As stated in the original dictation, left-sided pleural metastasis, widespread pulmonary metastases and hepatic metastases all appear increased compared to prior examinations. In addition, there is consolidative airspace disease in the left lower lobe, concerning for pneumonia. Electronically Signed   By: Vinnie Langton M.D.   On: 02/24/2021 07:47   Result Date: 02/24/2021 CLINICAL DATA:  Pulmonary embolism suspected. High probability. Metastatic breast cancer. Shortness of breath. EXAM: CT ANGIOGRAPHY CHEST WITH CONTRAST TECHNIQUE: Multidetector CT imaging of the chest was performed using the standard protocol during bolus administration of intravenous contrast. Multiplanar CT image reconstructions and MIPs were obtained to evaluate the vascular anatomy. RADIATION DOSE REDUCTION: This exam was performed according to the departmental dose-optimization program which includes automated exposure control, adjustment of the mA and/or kV according to patient size and/or use of iterative reconstruction technique. CONTRAST:  159mL OMNIPAQUE IOHEXOL 350 MG/ML SOLN COMPARISON:  Chest radiography same day.  Chest CT 01/27/2021. FINDINGS: Cardiovascular: Cardiomegaly with left ventricular prominence. No visible aortic atherosclerotic calcification. Pulmonary arterial opacification is good. There are no pulmonary emboli. Mediastinum/Nodes: Over the last month, there has been  considerable growth and mediastinal tumor. Anterior mediastinal disease previously measured at 15 and 17 mm on axial image 32 now measures 17 and 20 mm. Increased tumor growth in the subcarinal region and left hilum and along the left pericardial margin. Lungs/Pleura: Marked worsening of pulmonary metastatic disease. Innumerable nodules scattered throughout the lungs measuring 9 mm in size and smaller. There is worsening collapse of the left lower lobe. Lobular pleural disease on the left has worsened. Upper Abdomen: Worsening of hepatic metastatic disease. More numerous and larger metastatic lesions. For example, metastasis at the dome of the liver on the right previously measured 18 mm in diameter and now measures 21 mm in diameter. Musculoskeletal: Lytic sclerotic metastatic disease throughout the spine and ribs appears similar. Review of the MIP images confirms the above findings. IMPRESSION: No pulmonary emboli. Progression of metastatic tumor within the mediastinum, left hilum, manifest as nodular metastatic lesions throughout both lungs, and within the liver. Worsened volume loss in the left lower lobe with progression of pleural disease. This interpretation is rendered on an emergent basis in order to rule out pulmonary emboli. The exam will be reviewed and addended by body/oncologic specialist radiologist tomorrow. Electronically Signed: By: Nelson Chimes M.D. On: 02/23/2021 21:42   CARDIAC CATHETERIZATION  Result Date: 02/25/2021   The left ventricular systolic function is normal.   LV end diastolic pressure is normal.   The left ventricular ejection fraction is 55-65% by visual estimate. Conclusion Normal left ventricular function EF of 60% Normal coronaries Intervention deferred No conclusion   DG Chest Port 1 View  Result Date: 02/24/2021 CLINICAL DATA:  Shortness of breath, chest pain EXAM: PORTABLE CHEST 1 VIEW COMPARISON:  Previous studies including the examination of 02/23/2021 FINDINGS:  Transverse diameter of heart is increased. There is opacification of left lower lung fields consistent with pleural effusion and possibly underlying infiltrate with no significant change. Are possible small nodular densities in the right mid and right lower lung fields. Right lateral CP angle is clear. There is no pneumothorax. IMPRESSION: No significant interval changes are noted in the opacification of left lower lung fields suggesting pleural effusion and possibly underlying infiltrate.  Electronically Signed   By: Elmer Picker M.D.   On: 02/24/2021 15:06   DG Chest Portable 1 View  Result Date: 02/23/2021 CLINICAL DATA:  Chest pain and shortness of breath. History of metastatic breast cancer. EXAM: PORTABLE CHEST 1 VIEW COMPARISON:  02/21/2021 and prior radiograph FINDINGS: The cardiomediastinal silhouette is unchanged. LEFT LOWER lung consolidation/atelectasis and LEFT pleural thickening again noted. No new pulmonary findings are noted. There is no evidence of pneumothorax or acute bony abnormality. IMPRESSION: Unchanged appearance of the chest with LEFT LOWER lung consolidation/atelectasis and LEFT pleural thickening. Electronically Signed   By: Margarette Canada M.D.   On: 02/23/2021 20:42    SURGICAL PATHOLOGY     Reason for Addendum #1:  Breast Biomarker Results  Reason for Addendum #2:  Breast Biomarker Results   Clinical History: pleural effusion (cm)      FINAL MICROSCOPIC DIAGNOSIS:   A. PLEURA PEEL, LEFT:  - Metastatic adenocarcinoma.   COMMENT:   Immunohistochemistry is positive for cytokeratin 7, GATA-3, and ER. The  cells are negative for cytokeratin 20, CD56, synaptophysin,  chromogranin, TTF-1, NapsinA, p40 and cytokeratin 5/6. The findings are  consistent with metastatic mammary carcinoma. Prognostic markers will be  ordered.   INTRAOPERATIVE DIAGNOSIS:   A1. PLEURA PEEL, LEFT, FROZEN SECTION:         Non-small cell carcinoma.         Rapid intraoperative consult  diagnosis rendered by Dr. Melina Copa @  0920 07/29/2020.    GROSS DESCRIPTION:   Received fresh for rapid intraoperative consult evaluation by frozen  section are multiple irregular pieces of pink-white to pale yellow soft  to indurated and vaguely nodular tissues, 2.9 x 2.4 x 0.5 cm in  aggregate.  Representative sections are submitted in block 1 for frozen  section, with remaining specimen submitted blocks 2, 3 for routine  histology.   SW 07/29/2020    Final Diagnosis performed by Vicente Males, MD.   Electronically signed  07/31/2020  Technical component performed at [facility]. [facility], [place] [address], [place], [place] [address].   Professional component performed at [facility],  [place] [address], [place], [place] [address].   Immunohistochemistry Technical component (if applicable) was performed  at [facility] [facility]. [address], [place],  [place], [place] [address].   IMMUNOHISTOCHEMISTRY DISCLAIMER (if applicable):  Some of these immunohistochemical stains may have been developed and the  performance characteristics determine by [facility]. Some  may not have been cleared or approved by the U.S. Food and Drug  Administration. The FDA has determined that such clearance or approval  is not necessary. This test is used for clinical purposes. It should not  be regarded as investigational or for research. This laboratory is  certified under the [place]  (CLIA-[id number]) as qualified to perform high complexity clinical laboratory  testing.  The controls stained appropriately.   ADDENDUM:   PROGNOSTIC INDICATOR RESULTS:   Immunohistochemical and morphometric analysis performed manually   The tumor cells are EQUIVOCAL for Her2 (2+) with a heterogeneous  pattern. The most intense areas have been selected for Her2 FISH and  results will be reported in an addendum.   Estrogen Receptor:        POSITIVE, 80%, MODERATE TO STRONG STAINING  Progesterone Receptor:   POSITIVE, 25%, STRONG STAINING   Reference Range Estrogen and Progesterone Receptor       Negative  0%  Positive  >1%   All controls stained appropriately.         ASSESSMENT/PLAN   Acute on chronic hypoxemic respiratory failure - present on admission  - COVID19 negative - supplemental O2 during my evaluation 6 L/min nasal cannula - will perform infectious workup for pneumonia -Respiratory viral panel -serum fungitell -legionella ab -strep pneumoniae ur AG -Histoplasma Ur Ag -sputum resp cultures -AFB sputum expectorated specimen -sputum cytology  -reviewed pertinent imaging with patient today -CRP -please encourage patient to use incentive spirometer few times each hour while hospitalized.   -MetaNeb therapy for recruitment of left lower lobe consolidated infiltrate -Agree with Rocephin and Zithromax status post cefepime and vancomycin   Metastatic breast cancer -Patient is being followed by palliative care with hematology oncology service -Prognosis very poor at this point which is very unfortunate for this young patient -patient reports analgesia is inadequate and shares she awakes from sleep due to pain and has to ask for pain medication which takes a while after request.  I will schedule her dilaudid at current $RemoveBe'1mg'esFAkhYoT$  dose so she is not suffering from cancer associated pain   Left lower lobe atelectasis Continue albuterol nebulizer as well as MetaNeb therapy 3 times daily with respiratory therapist  Hypercalcemia of malignancy    -IV fluids and furosemide daily    -No severe EKG changes noted QRS slightly reduced to 90 MS   -Recommend nephrology consultation for additional evaluation management      Patient has severe multiple life-threatening active medical conditions which are evaluated and managed during this patient visit.  Thank you for allowing me to participate in the care of  this patient.   This document was prepared using Dragon voice recognition software and may include unintentional dictation errors.     Ottie Glazier, M.D.  Division of [place]

## 2021-03-09 NOTE — Assessment & Plan Note (Addendum)
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 ?Continue Xeloda ?--oncology consulted ?--oncology palliative care consulted ?--pain management, per oncology ?

## 2021-03-09 NOTE — Consult Note (Signed)
Kaylee Romero MRN: [id number] DOB/AGE: 09-19-1980 41 y.o. Primary Care Physician:Linthavong, Lucianne Muss, MD Admit date: 03/09/2021 Chief Complaint:  Chief Complaint  Patient presents with   Shortness of Breath   HPI: Kaylee Romero is a 41 Years old Caucasian female with past medical history of stage IV breast cancer with widespread metastases on Xeloda and palliative radiation therapy who is status post laparoscopic bilateral salpingo-oophorectomy with IUD removal on 2/13 to eliminate the need for hormonal suppression, history of hypertension, ADHD and anxiety Who came to the hospital with chief complaint of shortness of breath  History of present early in dates back to every day or 2 when patient said her shortness of breath came back.  Patient was recently in the hospital for treatment of a left lower lobe pneumonia.  Patient states she completed a course of Levaquin and her symptoms had improved until 1 day prior to her admission .  Upon evaluation in the ER patient had high WBC count, high lactic acid and patient had chest x-ray done patient was admitted with possible pneumonia Patient also had hypercalcemia and nephrology was consulted Patient was seen in the ER.  Patient does complain of having nausea She has a cough that is nonproductive but denies having any fever or chills. 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   Malignant neoplasm of upper-outer quadrant of right breast in female, estrogen receptor positive [facility])     oncologist--- dr Grayland Ormond;;  first dx 09/ 2020 s/p right lumpectomy w/ node dissection and completed chemoradiation 08/ 2021;;  recurrent 07/ 2022 to lung pleura,liver, and thoracic spine   SOB (shortness of breath)    02-12-2021  pt denies with regular activity but sob with stairs        Family History  Problem Relation Age of Onset   Hypertension Father    Alcohol abuse Paternal Grandfather    Heart disease Paternal Grandfather    Alcohol abuse Maternal Grandfather    Heart disease Maternal Grandmother    Breast cancer Other     Social History:  reports that she quit smoking about 4 years ago. Her smoking use included cigarettes. She started smoking about 19 years ago. She has a 10.00 pack-year smoking history. She has never used smokeless tobacco. She reports current alcohol use. She reports that she does not use drugs.   Allergies:  Allergies  Allergen Reactions   Betadine [Povidone-Iodine] Rash    (Not in a hospital admission)      DGL:OVFIE from the symptoms mentioned above,there are no other symptoms referable to all systems reviewed.   diphenhydrAMINE  12.5 mg Intravenous TID   enoxaparin (LOVENOX) injection  40 mg Subcutaneous Q24H   furosemide  40 mg Intravenous Daily    HYDROmorphone (DILAUDID) injection  1 mg Intravenous Q4H   metoprolol tartrate  12.5 mg Oral BID   venlafaxine XR  150 mg Oral QHS      Physical Exam: Vital signs  in last 24 hours: Temp:  [97.8 F (36.6 C)] 97.8 F (36.6 C) (03/05 0951) Pulse Rate:  [90-103] 100 (03/05 1725) Resp:  [17-21] 17 (03/05 1725) BP: (128-152)/(78-95) 128/78 (03/05 1725) SpO2:  [94 %-98 %] 94 % (03/05 1725) Weight:  [77 kg] 77 kg (03/05 0950) Weight change:     Intake/Output from previous day: No intake/output data recorded. No intake/output data recorded.   Physical Exam: General- pt is awake,alert, oriented to time place and person HEENT-head is atraumatic normocephalic Resp- No acute REsp  distress, decreased at bases CVS- S1S2 regular in rate and rhythm GIT- BS+, soft, NT, ND EXT- NO LE Edema, Cyanosis CNS- CN 2-12 grossly intact. Moving all 4 extremities Psych- normal mood and affect    Lab Results: CBC Recent Labs    03/09/21 1026  WBC 14.4*  HGB 13.4  HCT 39.8  PLT 348    BMET Recent Labs    03/09/21 1130  NA 140  K 3.8  CL 97*  CO2 29  GLUCOSE 125*  BUN 14  CREATININE 0.70  CALCIUM 13.1*    MICRO Recent Results (from the past 240 hour(s))  Resp Panel by RT-PCR (Flu A&B, Covid) Nasopharyngeal Swab     Status: None   Collection Time: 03/09/21 10:26 AM   Specimen: Nasopharyngeal Swab; Nasopharyngeal(NP) swabs in vial transport medium  Result Value Ref Range Status   SARS Coronavirus 2 by RT PCR NEGATIVE NEGATIVE Final    Comment: (NOTE) SARS-CoV-2 target nucleic acids are NOT DETECTED.  The SARS-CoV-2 RNA is generally detectable in upper respiratory specimens during the acute phase of infection. The lowest concentration of SARS-CoV-2 viral copies this assay can detect is 138 copies/mL. A negative result does not preclude SARS-Cov-2 infection and should not be used as the sole basis for treatment or other patient management decisions. A negative result may occur with  improper specimen collection/handling, submission of specimen other than nasopharyngeal swab, presence of viral mutation(s) within the areas targeted by this assay, and inadequate number of viral copies(<138 copies/mL). A negative result must be combined with clinical observations, patient history, and epidemiological information. The expected result is Negative.  Fact Sheet for Patients:  EntrepreneurPulse.com.au  Fact Sheet for Healthcare Providers:  IncredibleEmployment.be  This test is no t yet approved or cleared by the [place] FDA and  has been authorized for detection and/or diagnosis of SARS-CoV-2 by FDA under an Emergency  Use Authorization (EUA). This EUA will remain  in effect (meaning this test can be used) for the duration of the COVID-19 declaration under Section 564(b)(1) of the Act, 21 U.S.C.section 360bbb-3(b)(1), unless the authorization is terminated  or revoked sooner.       Influenza A by PCR NEGATIVE NEGATIVE Final   Influenza B by PCR NEGATIVE NEGATIVE Final    Comment: (NOTE) The Xpert Xpress SARS-CoV-2/FLU/RSV plus assay is intended as an aid in the diagnosis of influenza from Nasopharyngeal swab specimens and should not be used as a sole basis for treatment. Nasal washings and aspirates are unacceptable for Xpert Xpress SARS-CoV-2/FLU/RSV testing.  Fact Sheet for Patients: EntrepreneurPulse.com.au  Fact Sheet for Healthcare Providers: IncredibleEmployment.be  This test is not yet approved or cleared by the [place] FDA and has been authorized for detection and/or diagnosis of SARS-CoV-2 by FDA under an Emergency Use Authorization (EUA). This EUA will remain in effect (meaning this test can be used) for the duration of the COVID-19 declaration under Section 564(b)(1) of the Act, 21 U.S.C. section  360bbb-3(b)(1), unless the authorization is terminated or revoked.  Performed at [facility], [address], [place], [place] [address]       Lab Results  Component Value Date   CALCIUM 13.1 [facility]) 03/09/2021      Impression:  Patient is a 41 year old Caucasian female with a past medical history of stage IV breast cancer who was admitted for -Hypercalcemia Calcium related pain Depression Community-acquired pneumonia Hypertension    1)Hypercalcemia  Patient has hypercalcemia most likely to multiple factors  Iatrogenic hypercalcemia versus/and oncological cal issues Data in favor of iatrogenic causes of hypercalcemia patient was on calcium plus vitamin D Patient was also on 2000 units of vitamin D  Data in favor of  oncological issues of hypercalcemia patient does have stage IV breast cancer with metastasis to skeletal areas  Patient PET scan done in the past has shown osteolytic lesions PET scan done on November 18, 2020 showed New lytic lesion in the T10 vertebral body with metabolic activity consistent skeletal metastasis. 5. Lytic lesions of the LEFT lateral ribs also consistent metastatic disease. Metabolic activity difficult to assess sign due to adjacent intense pleural activity.  Patient will benefit from IV fluids I agree with IV zoledronic acid     2)HTN Blood pressure is stable   3)community-acquired pneumonia Patient is antibiotics Primary team and pulmonology are following   4)Malignant neoplasm of upper quadrant of right breast, estrogen receptor positive Patient is being followed up by primary care and oncology   5) cancer related pain This being followed by the primary team   Plan:  We will increase the rate of IV fluids Hold off adding diuretics for now We will ask for intact PTH and vitamin D levels       Darya Bigler s Raenah Murley 03/09/2021, 6:57 PM

## 2021-03-09 NOTE — ED Triage Notes (Signed)
Pt comes pov with SOB and emesis. Pt had pneumonia and was in the hospital last week. Pt states nausea and SOB came back. Has one day left of levaquin but otherwise has been taking consistently. Pt appears pale.  ?

## 2021-03-09 NOTE — H&P (Signed)
History and Physical    Patient: Kaylee Romero [id number]:[id number] DOB: 06-Aug-1980 DOA: 03/09/2021 DOS: the patient was seen and examined on 03/09/2021 PCP: Dion Body, MD  Patient coming from: Home  Chief Complaint:  Chief Complaint  Patient presents with   Shortness of Breath    HPI: Kaylee Romero is a 41 y.o. 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  Patient states she completed a course of Levaquin and her symptoms had improved until 1 day prior to her admission when she developed worsening nausea and vomiting.  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 She has a cough that is nonproductive but denies having any fever or chills. She complains of nausea, emesis and constipation but denies having any urinary symptoms, no headache, no blurred vision, no dizziness, no lightheadedness, no lower extremity swelling, no focal deficit. Repeat chest x-ray on admission shows persistent left basilar opacity. She received a dose of cefepime and vancomycin in the ER and will be admitted to the hospital for further evaluation.  Review of Systems: As mentioned in the history of present illness. All other systems reviewed and are negative. 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   Malignant neoplasm of upper-outer quadrant of right breast in female, estrogen receptor positive [facility])    oncologist--- dr Grayland Ormond;;  first dx 09/ 2020 s/p right lumpectomy w/ node dissection and completed chemoradiation 08/ 2021;;  recurrent 07/ 2022 to lung pleura,liver, and thoracic spine   SOB (shortness of breath)    02-12-2021  pt denies with regular activity but sob with stairs   Past Surgical History:  Procedure Laterality Date   ABDOMINAL HYSTERECTOMY N/A 02/17/2021   BREAST LUMPECTOMY WITH AXILLARY LYMPH NODE BIOPSY Right 11/21/2018   Procedure: RIGHT BREAST LUMPECTOMY WITH SENTINEL LYMPH NODE BIOPSY;  Surgeon: Jovita Kussmaul, MD;  Location: [place];  Service: General;  Laterality: Right;   BREAST REDUCTION SURGERY Bilateral 11/21/2018   Procedure: BILATERAL MAMMARY REDUCTION  (BREAST);  Surgeon: Wallace Going, DO;  Location: [place];  Service: Plastics;  Laterality: Bilateral;   CHEST TUBE INSERTION Left 07/29/2020   Procedure: INSERTION PLEURAL DRAINAGE CATHETER;  Surgeon: Lajuana Matte, MD;  Location: [place];  Service: Thoracic;  Laterality: Left;   IRRIGATION AND DEBRIDEMENT STERNOCLAVICULAR JOINT-STERNUM AND RIBS N/A 08/13/2020   Procedure: IRRIGATION AND DEBRIDEMENT CHEST WALL ABSCESS;  Surgeon: Lajuana Matte, MD;  Location: [place] [facility];  Service: Cardiothoracic;  Laterality: N/A;   IUD REMOVAL N/A 02/17/2021   Procedure: INTRAUTERINE DEVICE (IUD) REMOVAL;  Surgeon: Linda Hedges, DO;  Location: [place];  Service: Gynecology;  Laterality: N/A;   LAPAROSCOPIC SALPINGO OOPHERECTOMY Bilateral 02/17/2021  Procedure: LAPAROSCOPIC BILATERAL SALPINGO OOPHORECTOMY;  Surgeon: Linda Hedges, DO;  Location: [place];  Service: Gynecology;  Laterality: Bilateral;   LEFT HEART CATH AND CORONARY ANGIOGRAPHY N/A 02/25/2021   Procedure: LEFT HEART CATH AND  CORONARY ANGIOGRAPHY possible PCI and stent;  Surgeon: Yolonda Kida, MD;  Location: [place] [facility];  Service: Cardiovascular;  Laterality: N/A;   PLEURAL BIOPSY Left 07/29/2020   Procedure: PLEURAL BIOPSY;  Surgeon: Lajuana Matte, MD;  Location: [place];  Service: Thoracic;  Laterality: Left;   PLEURAL EFFUSION DRAINAGE Left 07/29/2020   Procedure: DRAINAGE OF PLEURAL EFFUSION;  Surgeon: Lajuana Matte, MD;  Location: [place];  Service: Thoracic;  Laterality: Left;   PORT-A-CATH REMOVAL N/A 07/06/2019   Procedure: REMOVAL PORT-A-CATH;  Surgeon: Jovita Kussmaul, MD;  Location: [place];  Service: General;  Laterality: N/A;   PORTACATH PLACEMENT N/A 11/21/2018   Procedure: INSERTION LEFT PORT-A-CATH WITH ULTRASOUND GUIDANCE;  Surgeon: Jovita Kussmaul, MD;  Location: [place];  Service: General;  Laterality: N/A;   [place] Left 07/29/2020   Procedure: VIDEO ASSISTED THORACOSCOPY;  Surgeon: Lajuana Matte, MD;  Location: [place];  Service: Thoracic;  Laterality: Left;   WISDOM TOOTH EXTRACTION     Social History:  reports that she quit smoking about 4 years ago. Her smoking use included cigarettes. She started smoking about 19 years ago. She has a 10.00 pack-year smoking history. She has never used smokeless tobacco. She reports current alcohol use. She reports that she does not use drugs.  Allergies  Allergen Reactions   Betadine [Povidone-Iodine] Rash    Family History  Problem Relation Age of Onset   Hypertension Father    Alcohol abuse Paternal Grandfather    Heart disease Paternal Grandfather    Alcohol abuse Maternal Grandfather    Heart disease Maternal Grandmother    Breast cancer Other     Prior to Admission medications   Medication Sig Start Date End Date Taking? Authorizing Provider  acetaminophen (TYLENOL) 500 MG tablet Take 1,000 mg by mouth every 6 (six) hours as needed.    [provider]  albuterol (VENTOLIN HFA) 108 (90 Base) MCG/ACT inhaler INHALE 2 PUFFS INTO THE LUNGS EVERY 6 HOURS AS NEEDED FOR WHEEZING OR SHORTNESS OF BREATH 02/05/21   Lloyd Huger, MD  ALPRAZolam Duanne Moron) 0.25 MG tablet Take 1 tablet (0.25 mg total) by mouth 2 (two) times daily as needed for anxiety. 01/17/21   Lloyd Huger, MD  atomoxetine (STRATTERA) 40 MG capsule Take 40 mg by mouth daily.    [provider]  Calcium Citrate-Vitamin D (CALCIUM + D PO) Take 1 tablet by mouth 3 (three) times daily.    [provider]  capecitabine (XELODA) 500 MG tablet TAKE 4 TABLETS BY MOUTH 2 TIMES DAILY AFTER A MEAL FOR 14 DAYS, THEN HOLD FOR 7 DAYS, REPEAT EVERY 21 DAYS. 02/26/21   Lloyd Huger, MD  Cholecalciferol (VITAMIN D3) 50 MCG (2000 UT) TABS Take 1 tablet by mouth daily at 12 noon.    [provider]  docusate sodium (COLACE) 100 MG capsule Take 100 mg by mouth 2 (two) times daily as needed for mild constipation.    [provider]  guaiFENesin (ROBITUSSIN) 100 MG/5ML liquid Take 5 mLs by mouth every 4 (four) hours as needed for cough or to loosen phlegm. 02/26/21   Shawna Clamp, MD  guanFACINE (INTUNIV) 2 MG  TB24 ER tablet Take 2 mg by mouth daily. 01/13/21   [provider]  ibuprofen (ADVIL) 200 MG tablet Take 400 mg by mouth every 6 (six) hours as needed for mild pain.    [provider]  ipratropium-albuterol (DUONEB) 0.5-2.5 (3) MG/3ML SOLN Take 3 mLs by nebulization every 4 (four) hours as needed. 02/26/21   Shawna Clamp, MD  levofloxacin (LEVAQUIN) 750 MG tablet Take 1 tablet (750 mg total) by mouth daily for 7 days. 03/03/21 03/10/21  Lloyd Huger, MD  meloxicam (MOBIC) 7.5 MG tablet Take 1 tablet (7.5 mg total) by mouth daily. 02/27/21   Borders, Kirt Boys, NP  metoprolol tartrate (LOPRESSOR) 25 MG tablet Take 1 tablet (25 mg total) by mouth 2 (two) times daily. 02/26/21   Shawna Clamp, MD  morphine (MS CONTIN) 15 MG 12 hr tablet  Take 2 tablets (30 mg total) by mouth every 8 (eight) hours. 03/07/21   Verlon Au, NP  ondansetron (ZOFRAN) 8 MG tablet Take 1 tablet (8 mg total) by mouth every 8 (eight) hours as needed for nausea or vomiting. 01/30/21   Darl Pikes, RPH-CPP  oxyCODONE (OXY IR/ROXICODONE) 5 MG immediate release tablet Take 1 tablet (5 mg total) by mouth every 6 (six) hours as needed for severe pain. 02/27/21   Lloyd Huger, MD  polyethylene glycol (MIRALAX / GLYCOLAX) 17 g packet Take 17 g by mouth daily. Patient taking differently: Take 17 g by mouth daily as needed for mild constipation. 08/15/20   Ezekiel Slocumb, DO  prochlorperazine (COMPAZINE) 10 MG tablet Take 1 tablet (10 mg total) by mouth 2 (two) times daily. Take 30 to 60 minutes prior to capecitabine (Xeloda) dose. 02/27/21   Darl Pikes, RPH-CPP  promethazine (PHENERGAN) 25 MG tablet Take 1 tablet (25 mg total) by mouth every 6 (six) hours as needed for nausea or vomiting. 02/20/21   Lloyd Huger, MD  Tetrahydrozoline HCl (VISINE OP) Place 1 drop into both eyes daily as needed (redness).    [provider]  venlafaxine XR (EFFEXOR-XR) 150 MG 24 hr capsule TAKE 1 CAPSULE(150 MG) BY MOUTH DAILY WITH BREAKFAST Patient taking differently: 150 mg at bedtime. 01/07/21   Lloyd Huger, MD    Physical Exam: Vitals:   03/09/21 0950 03/09/21 0951 03/09/21 1030  BP:  (!) 152/91 (!) 142/95  Pulse:  90 98  Resp:  18 (!) 21  Temp:  97.8 F (36.6 C)   TempSrc:  Oral   SpO2:  97% 97%  Weight: 77 kg    Height: _0  (1.6 m)     Physical Exam Vitals and nursing note reviewed.  Constitutional:      Appearance: She is well-developed and normal weight.  HENT:     Head: Normocephalic.     Mouth/Throat:     Comments: Dry  Eyes:     Pupils: Pupils are equal, round, and reactive to light.  Cardiovascular:     Rate and Rhythm: Normal rate and regular rhythm.  Pulmonary:     Effort: Pulmonary effort is normal.      Breath sounds: Examination of the left-lower field reveals decreased breath sounds. Decreased breath sounds present.  Abdominal:     General: Bowel sounds are normal.     Palpations: Abdomen is soft.  Musculoskeletal:        General: Normal range of motion.     Cervical back: Normal range of motion and neck supple.  Skin:  General: Skin is warm and dry.  Neurological:     General: No focal deficit present.     Mental Status: She is alert.  Psychiatric:        Mood and Affect: Mood normal.        Behavior: Behavior normal.     Data Reviewed: Relevant notes from primary care and specialist visits, past discharge summaries as available in EHR, including Care Everywhere. Prior diagnostic testing as pertinent to current admission diagnoses Updated medications and problem lists for reconciliation ED course, including vitals, labs, imaging, treatment and response to treatment Triage notes, nursing and pharmacy notes and ED provider's notes Notable results as noted in HPI Labs reviewed.  Calcium 13.1, glucose 125, chloride 97, troponin 52, lactic acid 2.3, white count 14.4 Chest x-ray reviewed by me shows stable cardiomegaly.  Left basilar opacity which may represent atelectasis, infiltrate with small effusion.  Numerous bilateral nodular pulmonary opacities consistent with metastatic disease Twelve-lead EKG reviewed by me shows sinus rhythm with short PR interval There are no new results to review at this time.  Assessment and Plan: * Hypercalcemia Most likely related to known bony metastasis with concomitant oral calcium administration. Will hydrate patient with normal saline Will give 1 dose of zoledronic acid Repeat calcium levels in a.m. We will consult oncology    Cancer-related pain Secondary to known widespread metastatic disease Continue MS Contin and Oxycodone    Depression Stable Continue venlafaxine  CAP (community acquired pneumonia) ? Recurrent left lower  lobe pneumonia She has leukocytosis and imaging shows dense consolidation in the left lower lobe Patient recently completed a course of Levaquin. We will treat patient with IV Rocephin and Zithromax. Will most likely benefit from pulmonary consult in a.m.   Essential hypertension Blood pressure stable Continue metoprolol  Malignant neoplasm of upper-outer quadrant of right breast in female, estrogen receptor positive ([place]) Patient with a known history of stage IV ER/PR positive, HER2 negative invasive carcinoma of the breast with widespread metastatic disease. Continue Xeloda Follow-up with oncology as an outpatient  ADD (attention deficit disorder) Stable Continue Strattera and guanfacine       Advance Care Planning:   Code Status: Full Code   Consults: Oncology  Family Communication: Greater than 50% of time was spent discussing patient's condition and plan of care with her at the bedside.  Addressed.  She verbalizes understanding and agrees with the plan.  Severity of Illness: The appropriate patient status for this patient is INPATIENT. Inpatient status is judged to be reasonable and necessary in order to provide the required intensity of service to ensure the patient's safety. The patient's presenting symptoms, physical exam findings, and initial radiographic and laboratory data in the context of their chronic comorbidities is felt to place them at high risk for further clinical deterioration. Furthermore, it is not anticipated that the patient will be medically stable for discharge from the hospital within 2 midnights of admission.   * I certify that at the point of admission it is my clinical judgment that the patient will require inpatient hospital care spanning beyond 2 midnights from the point of admission due to high intensity of service, high risk for further deterioration and high frequency of surveillance required.*  Author: Collier Bullock, MD 03/09/2021 1:32  PM  For on call review [email or web address].

## 2021-03-09 NOTE — Assessment & Plan Note (Addendum)
--  Recurrent left lower lobe pneumonia vs delayed imaging resolution of previous treated PNA. ?--pulm consulted, rec broadened to zosyn ?Plan: ?--cont zosyn ? ?

## 2021-03-09 NOTE — Assessment & Plan Note (Addendum)
Stable ?--resume home Adderall ?

## 2021-03-09 NOTE — Assessment & Plan Note (Signed)
Stable ?Continue venlafaxine ?

## 2021-03-09 NOTE — ED Notes (Signed)
RN unable to obtain labs at this time   Will ask another staff member to try ? ?

## 2021-03-09 NOTE — ED Provider Notes (Signed)
? ?[facility] ?Provider Note ? ? ? Event Date/Time  ? First MD Initiated Contact with Patient 03/09/21 [phone]   ?  (approximate) ? ? ?History  ? ?Shortness of Breath ? ? ?HPI ? ?Kaylee Romero is a 41 y.o. female with history of breast cancer on Xeloda and palliative radiation, hypertension, and recent admission for pneumonia and NSTEMI who presents with shortness of breath over the last several days, worsening course, associated with nausea, decreased appetite, and left lower chest/upper quadrant abdominal pain.  The patient denies diarrhea and states she is actually somewhat constipated.  She denies fever or chills or urinary symptoms.  She does have a cough.  The patient states that she was restarted on levofloxacin last week for a 7-day course after developing some cough and shortness of breath and has 1 day left, but it has not been improving. ? ? ? ?Physical Exam  ? ?Triage Vital Signs: ?ED Triage Vitals  ?Enc Vitals Group  ?   BP 03/09/21 0951 (!) 152/91  ?   Pulse Rate 03/09/21 0951 90  ?   Resp 03/09/21 0951 18  ?   Temp 03/09/21 0951 97.8 ?F (36.6 ?C)  ?   Temp Source 03/09/21 0951 Oral  ?   SpO2 03/09/21 0951 97 %  ?   Weight 03/09/21 0950 169 lb 12.1 oz (77 kg)  ?   Height 03/09/21 0950 '5\' 3"'$  (1.6 m)  ?   Head Circumference --   ?   Peak Flow --   ?   Pain Score 03/09/21 0949 8  ?   Pain Loc --   ?   Pain Edu? --   ?   Excl. in [place]? --   ? ? ?Most recent vital signs: ?Vitals:  ? 03/09/21 0951 03/09/21 1030  ?BP: (!) 152/91 (!) 142/95  ?Pulse: 90 98  ?Resp: 18 (!) 21  ?Temp: 97.8 ?F (36.6 ?C)   ?SpO2: 97% 97%  ? ? ? ?General: Alert, somewhat uncomfortable appearing but in no acute distress. ?CV:  Good peripheral perfusion.  Normal heart sounds. ?Resp:  Normal effort.  Lungs CTAB. ?Abd:  Soft and nontender.  No distention.  ?Other:  Mild left lower anterior rib area tenderness.  No peripheral edema. ? ? ?ED Results / Procedures / Treatments  ? ?Labs ?(all labs ordered are listed, but  only abnormal results are displayed) ?Labs Reviewed  ?CBC WITH DIFFERENTIAL/PLATELET - Abnormal; Notable for the following components:  ?    Result Value  ? WBC 14.4 (*)   ? Neutro Abs 13.0 (*)   ? Abs Immature Granulocytes 0.15 (*)   ? All other components within normal limits  ?LACTIC ACID, PLASMA - Abnormal; Notable for the following components:  ? Lactic Acid, Venous 2.3 (*)   ? All other components within normal limits  ?COMPREHENSIVE METABOLIC PANEL - Abnormal; Notable for the following components:  ? Chloride 97 (*)   ? Glucose, Bld 125 (*)   ? Calcium 13.1 (*)   ? AST 95 (*)   ? All other components within normal limits  ?TROPONIN I (HIGH SENSITIVITY) - Abnormal; Notable for the following components:  ? Troponin I (High Sensitivity) 52 (*)   ? All other components within normal limits  ?RESP PANEL BY RT-PCR (FLU A&B, COVID) ARPGX2  ?MRSA NEXT GEN BY PCR, NASAL  ?LIPASE, BLOOD  ?LACTIC ACID, PLASMA  ?URINALYSIS, ROUTINE W REFLEX MICROSCOPIC  ?TROPONIN I (HIGH SENSITIVITY)  ? ? ? ?  EKG ? ?ED ECG REPORT ?IArta Silence, the attending physician, personally viewed and interpreted this ECG. ? ?Date: 03/09/2021 ?EKG Time: 0950 ?Rate: 95 ?Rhythm: normal sinus rhythm ?QRS Axis: normal ?Intervals: normal ?ST/T Wave abnormalities: Nonspecific ST abnormalities ?Narrative Interpretation: no evidence of acute ischemia ? ? ? ?RADIOLOGY ? ?Chest x-ray: I independently viewed and interpreted the images; there is a persistent left lower lobe consolidation ? ?PROCEDURES: ? ?Critical Care performed: No ? ?Procedures ? ? ?MEDICATIONS ORDERED IN ED: ?Medications  ?ceFEPIme (MAXIPIME) 2 g in sodium chloride 0.9 % 100 mL IVPB (2 g Intravenous New Bag/Given 03/09/21 1252)  ?vancomycin (VANCOREADY) IVPB 1750 mg/350 mL (has no administration in time range)  ?ondansetron (ZOFRAN) injection 4 mg (4 mg Intravenous Given 03/09/21 1043)  ?sodium chloride 0.9 % bolus 1,000 mL (0 mLs Intravenous Stopped 03/09/21 1253)  ?HYDROmorphone  (DILAUDID) injection 0.5 mg (0.5 mg Intravenous Given 03/09/21 1101)  ?sodium chloride 0.9 % bolus 1,000 mL (1,000 mLs Intravenous New Bag/Given 03/09/21 1252)  ?HYDROmorphone (DILAUDID) injection 0.5 mg (0.5 mg Intravenous Given 03/09/21 1252)  ? ? ? ?IMPRESSION / MDM / ASSESSMENT AND PLAN / ED COURSE  ?I reviewed the triage vital signs and the nursing notes. ? ?41 year old female with PMH as noted above presents with worsening shortness of breath, cough, nausea, decreased appetite, as well as some acute on chronic left sided chest/abdominal pain. ? ?I reviewed the past medical records; per the hospitalist discharge summary from 2/22 the patient was admitted for pneumonia, received fluid resuscitation, IV antibiotics, and also had an NSTEMI with a negative left heart catheterization.  She was sent home with 2 additional days of levofloxacin to be completed after her admission.   ? ?Differential diagnosis includes, but is not limited to, recurrent pneumonia, bronchitis, COVID-19, influenza, other viral etiology, progression of cancer, constipation, gastritis, gastroenteritis.  We will obtain lab work-up, chest x-ray, give fluids, antiemetics, and reassess. ? ?The patient is on the cardiac monitor to evaluate for evidence of arrhythmia and/or significant heart rate changes. ? ?----------------------------------------- ?12:55 PM on 03/09/2021 ?----------------------------------------- ? ?Chest x-ray shows persistent unchanged left lower lobe opacity which is consistent with the area of the patient's pain and suggestive of possible pneumonia.  Although this has not worsened since her prior imaging, her WBC count has risen over the last 10 days and the lactate is up as well.  Overall work-up favors current pneumonia.  Given that the patient has been on Levaquin for the last 6 days I recommend readmission and she agrees.  Lab work-up is also significant for hypercalcemia which is new.  The patient is receiving IV fluids. ? ?I  consulted Dr. Francine Graven from the hospitalist service; based on her discussion she agrees to admit the patient. ? ? ?FINAL CLINICAL IMPRESSION(S) / ED DIAGNOSES  ? ?Final diagnoses:  ?HCAP (healthcare-associated pneumonia)  ?Hypercalcemia  ? ? ? ?Rx / DC Orders  ? ?ED Discharge Orders   ? ? None  ? ?  ? ? ? ?Note:  This document was prepared using Dragon voice recognition software and may include unintentional dictation errors.  ?  ?Arta Silence, MD ?03/09/21 1257 ? ?

## 2021-03-09 NOTE — Progress Notes (Signed)
PHARMACY -  BRIEF ANTIBIOTIC NOTE  ? ?Pharmacy has received consult(s) for vancomycin and cefepime from an ED provider.  The patient's profile has been reviewed for ht/wt/allergies/indication/available labs.   ? ?One time order(s) placed for vancomycin 1750 mg and cefepime 2 grams ? ?Further antibiotics/pharmacy consults should be ordered by admitting physician if indicated.       ?                ?Thank you, ?Wynelle Cleveland, PharmD ?Pharmacy Resident  ?03/09/2021 ?12:37 PM ? ? ?

## 2021-03-09 NOTE — Assessment & Plan Note (Addendum)
--  nephrology consulted ?--Zoledronic acid 4 mg administered. ?Plan: ?--decrease NS to 75 ml/hr, per nephro rec ?--d/c furosemide 40 mg IV daily. ? ?

## 2021-03-09 NOTE — Assessment & Plan Note (Signed)
Blood pressure stable ?Continue metoprolol ?

## 2021-03-10 ENCOUNTER — Encounter: Payer: Self-pay | Admitting: Oncology

## 2021-03-10 ENCOUNTER — Telehealth: Payer: Self-pay | Admitting: *Deleted

## 2021-03-10 ENCOUNTER — Inpatient Hospital Stay: Payer: Self-pay

## 2021-03-10 DIAGNOSIS — Z515 Encounter for palliative care: Secondary | ICD-10-CM

## 2021-03-10 LAB — BASIC METABOLIC PANEL
Anion gap: 15 (ref 5–15)
BUN: 16 mg/dL (ref 6–20)
CO2: 21 mmol/L — ABNORMAL LOW (ref 22–32)
Calcium: UNDETERMINED mg/dL (ref 8.9–10.3)
Chloride: 104 mmol/L (ref 98–111)
Creatinine, Ser: 0.7 mg/dL (ref 0.44–1.00)
GFR, Estimated: >60 mL/min (ref >60)
Glucose, Bld: 88 mg/dL (ref 70–99)
Potassium: 3.5 mmol/L (ref 3.5–5.1)
Sodium: 140 mmol/L (ref 135–145)

## 2021-03-10 LAB — RESPIRATORY PANEL BY PCR

## 2021-03-10 LAB — CBC
HCT: 34.1 % — ABNORMAL LOW (ref 36.0–46.0)
Hemoglobin: 11.6 g/dL — ABNORMAL LOW (ref 12.0–15.0)
MCH: 33.4 pg (ref 26.0–34.0)
MCHC: 34 g/dL (ref 30.0–36.0)
MCV: 98.3 fL (ref 80.0–100.0)
Platelets: 299 10*3/uL (ref 150–400)
RBC: 3.47 MIL/uL — ABNORMAL LOW (ref 3.87–5.11)
RDW: 13.7 % (ref 11.5–15.5)
WBC: 17.9 10*3/uL — ABNORMAL HIGH (ref 4.0–10.5)
nRBC: 0 % (ref 0.0–0.2)

## 2021-03-10 LAB — CRYPTOCOCCAL ANTIGEN: Crypto Ag: NEGATIVE

## 2021-03-10 LAB — VITAMIN D 25 HYDROXY (VIT D DEFICIENCY, FRACTURES): Vit D, 25-Hydroxy: 47.54 ng/mL (ref 30–100)

## 2021-03-10 LAB — MRSA NEXT GEN BY PCR, NASAL: MRSA by PCR Next Gen: NOT DETECTED

## 2021-03-10 LAB — C-REACTIVE PROTEIN: CRP: 1 mg/dL — ABNORMAL HIGH (ref <1.0)

## 2021-03-10 MED ORDER — AZITHROMYCIN 250 MG PO TABS
500.0000 mg | ORAL_TABLET | Freq: Every day | ORAL | Status: DC
Start: 2021-03-10 — End: 2021-03-14
  Administered 2021-03-10 – 2021-03-13 (×4): 500 mg via ORAL
  Filled 2021-03-10 (×4): qty 2

## 2021-03-10 MED ORDER — SODIUM CHLORIDE 0.9% FLUSH: 10.0000 mL | INTRAVENOUS | Status: DC | PRN

## 2021-03-10 MED ORDER — OXYCODONE HCL 5 MG PO TABS
5.0000 mg | ORAL_TABLET | ORAL | Status: DC | PRN
  Administered 2021-03-10: 10 mg via ORAL
  Filled 2021-03-10: qty 2

## 2021-03-10 MED ORDER — SODIUM CHLORIDE 0.9 % IV SOLN: INTRAVENOUS | Status: DC

## 2021-03-10 MED ORDER — PIPERACILLIN-TAZOBACTAM 3.375 G IVPB
3.3750 g | Freq: Three times a day (TID) | INTRAVENOUS | Status: DC
  Administered 2021-03-10 – 2021-03-13 (×9): 3.375 g via INTRAVENOUS
  Filled 2021-03-10 (×9): qty 50

## 2021-03-10 MED ORDER — HYDROMORPHONE HCL 1 MG/ML IJ SOLN
0.5000 mg | INTRAMUSCULAR | Status: DC | PRN
  Administered 2021-03-10: 0.5 mg via INTRAVENOUS
  Filled 2021-03-10: qty 1

## 2021-03-10 MED ORDER — ENSURE ENLIVE PO LIQD
237.0000 mL | Freq: Three times a day (TID) | ORAL | Status: DC
  Administered 2021-03-10 – 2021-03-13 (×8): 237 mL via ORAL

## 2021-03-10 MED ORDER — CHLORHEXIDINE GLUCONATE CLOTH 2 % EX PADS
6.0000 | MEDICATED_PAD | Freq: Every day | CUTANEOUS | Status: DC
  Administered 2021-03-10 – 2021-03-13 (×4): 6 via TOPICAL

## 2021-03-10 MED ORDER — PROCHLORPERAZINE EDISYLATE 10 MG/2ML IJ SOLN
10.0000 mg | Freq: Four times a day (QID) | INTRAMUSCULAR | Status: DC | PRN
  Administered 2021-03-10 (×2): 10 mg via INTRAVENOUS
  Filled 2021-03-10 (×2): qty 2

## 2021-03-10 MED ORDER — HYDROMORPHONE HCL 1 MG/ML IJ SOLN
0.5000 mg | INTRAMUSCULAR | Status: DC | PRN
  Administered 2021-03-10 – 2021-03-12 (×8): 0.5 mg via INTRAVENOUS
  Filled 2021-03-10 (×8): qty 0.5

## 2021-03-10 MED ORDER — MORPHINE SULFATE ER 15 MG PO TBCR
30.0000 mg | EXTENDED_RELEASE_TABLET | Freq: Three times a day (TID) | ORAL | Status: DC
  Administered 2021-03-10 – 2021-03-11 (×3): 30 mg via ORAL
  Filled 2021-03-10: qty 2
  Filled 2021-03-10: qty 1
  Filled 2021-03-10 (×2): qty 2

## 2021-03-10 MED ORDER — OXYCODONE HCL 5 MG PO TABS: 5.0000 mg | ORAL_TABLET | ORAL | Status: DC | PRN

## 2021-03-10 MED ORDER — SENNA 8.6 MG PO TABS
2.0000 | ORAL_TABLET | Freq: Every day | ORAL | Status: DC
  Administered 2021-03-10: 17.2 mg via ORAL
  Filled 2021-03-10: qty 2

## 2021-03-10 MED ORDER — VENLAFAXINE HCL ER 75 MG PO CP24
150.0000 mg | ORAL_CAPSULE | Freq: Every day | ORAL | Status: DC
  Administered 2021-03-10 – 2021-03-13 (×4): 150 mg via ORAL
  Filled 2021-03-10: qty 1
  Filled 2021-03-10 (×3): qty 2

## 2021-03-10 MED ORDER — HYDROMORPHONE HCL 2 MG PO TABS
2.0000 mg | ORAL_TABLET | ORAL | Status: DC
  Administered 2021-03-10 – 2021-03-11 (×5): 2 mg via ORAL
  Filled 2021-03-10 (×5): qty 1

## 2021-03-10 MED ORDER — SODIUM CHLORIDE 0.9% FLUSH
10.0000 mL | Freq: Two times a day (BID) | INTRAVENOUS | Status: DC
  Administered 2021-03-10 – 2021-03-13 (×6): 10 mL

## 2021-03-10 MED ORDER — POLYETHYLENE GLYCOL 3350 17 G PO PACK
17.0000 g | PACK | Freq: Every day | ORAL | Status: DC
  Administered 2021-03-10: 17 g via ORAL
  Filled 2021-03-10: qty 1

## 2021-03-10 NOTE — Consult Note (Signed)
[place]  [phone] Fax:(336) [phone]  ID: NEMA OATLEY OB: 19-Mar-1980  MR#: [id number]  ZSW#:[id number]  Patient Care Team: Dion Body, MD as PCP - General (Family Medicine) Gery Pray, MD as Consulting Physician (Radiation Oncology) Jovita Kussmaul, MD as Consulting Physician (General Surgery) Dillingham, Loel Lofty, DO as Attending Physician (Plastic Surgery) Lloyd Huger, MD as Consulting Physician (Oncology)  CHIEF COMPLAINT: Stage IV breast cancer with progressive nausea, weakness and now hypercalcemia.  INTERVAL HISTORY: Patient is a 41 year old female who is actively being treated for progressive stage IV breast cancer who reports over the last week becoming increasingly weak and fatigued with increasing nausea.  Over the same timeframe she has had a decreased appetite with minimal p.o. intake.  Her pain has also increased.  She has no neurologic complaints.  She denies any recent fevers.  She admits to increased weight loss.  She continues to have significant chest and flank pain.  She denies any hemoptysis.  She has no constipation or diarrhea.  She has no urinary complaints.  Patient feels generally terrible, but offers no further specific complaints today.  REVIEW OF SYSTEMS:   Review of Systems  Constitutional:  Positive for malaise/fatigue and weight loss. Negative for fever.  Respiratory:  Positive for shortness of breath. Negative for hemoptysis.   Cardiovascular:  Positive for chest pain. Negative for leg swelling.  Gastrointestinal:  Positive for abdominal pain, nausea and vomiting.  Genitourinary: Negative.  Negative for dysuria.  Musculoskeletal: Negative.  Negative for back pain.  Skin: Negative.  Negative for rash.  Neurological:  Positive for weakness. Negative for dizziness, focal weakness and headaches.  Psychiatric/Behavioral:  The patient is nervous/anxious.    As per HPI. Otherwise, a complete review of  systems is negative.  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   Malignant neoplasm of upper-outer quadrant of right breast in female, estrogen receptor positive [facility])    oncologist--- dr Grayland Ormond;;  first dx 09/ 2020 s/p right lumpectomy w/ node dissection and completed chemoradiation 08/ 2021;;  recurrent 07/ 2022 to lung pleura,liver, and thoracic spine   SOB (shortness of breath)    02-12-2021  pt denies with regular activity but sob with stairs    PAST SURGICAL HISTORY: Past Surgical History:  Procedure Laterality Date   ABDOMINAL HYSTERECTOMY N/A 02/17/2021   BREAST LUMPECTOMY WITH AXILLARY LYMPH NODE BIOPSY Right 11/21/2018   Procedure: RIGHT BREAST LUMPECTOMY WITH SENTINEL LYMPH NODE BIOPSY;  Surgeon: Jovita Kussmaul, MD;  Location: [place];  Service: General;  Laterality: Right;   BREAST REDUCTION SURGERY Bilateral 11/21/2018   Procedure: BILATERAL MAMMARY REDUCTION  (BREAST);  Surgeon: Wallace Going, DO;  Location: [place];  Service: Plastics;  Laterality: Bilateral;   CHEST TUBE INSERTION Left 07/29/2020   Procedure: INSERTION PLEURAL DRAINAGE CATHETER;  Surgeon: Lajuana Matte, MD;  Location: [place];  Service: Thoracic;  Laterality: Left;   IRRIGATION AND DEBRIDEMENT STERNOCLAVICULAR JOINT-STERNUM AND RIBS N/A 08/13/2020   Procedure: IRRIGATION AND DEBRIDEMENT CHEST WALL ABSCESS;  Surgeon: Lajuana Matte,  MD;  Location: [place];  Service: Cardiothoracic;  Laterality: N/A;   IUD REMOVAL N/A 02/17/2021   Procedure: INTRAUTERINE DEVICE (IUD) REMOVAL;  Surgeon: Linda Hedges, DO;  Location: [place];  Service: Gynecology;  Laterality: N/A;   LAPAROSCOPIC SALPINGO OOPHERECTOMY Bilateral 02/17/2021   Procedure: LAPAROSCOPIC BILATERAL SALPINGO OOPHORECTOMY;  Surgeon: Linda Hedges, DO;  Location: [place];  Service: Gynecology;  Laterality: Bilateral;   LEFT HEART CATH AND CORONARY ANGIOGRAPHY N/A 02/25/2021   Procedure: LEFT HEART CATH AND CORONARY ANGIOGRAPHY possible PCI and stent;  Surgeon: Yolonda Kida, MD;  Location: [place] [facility];  Service: Cardiovascular;  Laterality: N/A;   PLEURAL BIOPSY Left 07/29/2020   Procedure: PLEURAL BIOPSY;  Surgeon: Lajuana Matte, MD;  Location: [place];  Service: Thoracic;  Laterality: Left;   PLEURAL EFFUSION DRAINAGE Left 07/29/2020   Procedure: DRAINAGE OF PLEURAL EFFUSION;  Surgeon: Lajuana Matte, MD;  Location: [place];  Service: Thoracic;  Laterality: Left;   PORT-A-CATH REMOVAL N/A 07/06/2019   Procedure: REMOVAL PORT-A-CATH;  Surgeon: Jovita Kussmaul, MD;  Location: [place];  Service: General;  Laterality: N/A;   PORTACATH PLACEMENT N/A 11/21/2018   Procedure: INSERTION LEFT PORT-A-CATH WITH ULTRASOUND GUIDANCE;  Surgeon: Jovita Kussmaul, MD;  Location: [place];  Service: General;  Laterality: N/A;   TONSILLECTOMY  1987   VIDEO ASSISTED THORACOSCOPY Left 07/29/2020   Procedure: VIDEO ASSISTED THORACOSCOPY;  Surgeon: Lajuana Matte, MD;  Location: [place];  Service: Thoracic;  Laterality: Left;   WISDOM TOOTH EXTRACTION      FAMILY HISTORY: Family History  Problem Relation Age of Onset   Hypertension Father    Alcohol abuse Paternal Grandfather    Heart disease Paternal Grandfather    Alcohol abuse Maternal Grandfather    Heart disease Maternal Grandmother    Breast cancer Other     ADVANCED DIRECTIVES (Y/N):  '@ADVDIR'$ @  HEALTH MAINTENANCE: Social History   Tobacco Use   Smoking status: Former    Packs/day: 0.50    Years: 20.00    Pack years: 10.00     Types: Cigarettes    Start date: 01/13/2002    Quit date: 10/17/2016    Years since quitting: 4.3   Smokeless tobacco: Never  Vaping Use   Vaping Use: Never used  Substance Use Topics   Alcohol use: Yes    Comment: Socially   Drug use: Never     Colonoscopy:  PAP:  Bone density:  Lipid panel:  Allergies  Allergen Reactions   Betadine [Povidone-Iodine] Rash    Current Facility-Administered Medications  Medication Dose Route Frequency Provider Last Rate Last Admin   acetaminophen (TYLENOL) tablet 1,000 mg  1,000 mg Oral Q6H PRN Agbata, Tochukwu, MD   1,000 mg at 03/10/21 0829   ALPRAZolam (XANAX) tablet 0.25 mg  0.25 mg Oral BID PRN Agbata, Tochukwu, MD   0.25 mg at 03/10/21 0504   azithromycin (ZITHROMAX) tablet 500 mg  500 mg Oral Daily Benita Gutter, RPH       enoxaparin (LOVENOX) injection 40 mg  40 mg Subcutaneous Q24H Agbata, Tochukwu, MD   40 mg at 03/09/21 2212   feeding supplement (ENSURE ENLIVE / ENSURE PLUS) liquid 237 mL  237 mL Oral TID BM Enzo Bi, MD       furosemide (LASIX) injection 40 mg  40 mg Intravenous Daily Ottie Glazier, MD   40 mg at 03/10/21 0910   guaiFENesin (ROBITUSSIN) 100 MG/5ML  liquid 5 mL  5 mL Oral Q4H PRN Agbata, Tochukwu, MD       HYDROmorphone (DILAUDID) tablet 2 mg  2 mg Oral Q4H Enzo Bi, MD   2 mg at 03/10/21 1301   ipratropium-albuterol (DUONEB) 0.5-2.5 (3) MG/3ML nebulizer solution 3 mL  3 mL Nebulization Q4H PRN Agbata, Tochukwu, MD   3 mL at 03/10/21 0454   lactated ringers infusion   Intravenous Continuous Liana Gerold, MD 75 mL/hr at 03/10/21 0332 Infusion Verify at 03/10/21 0332   metoprolol tartrate (LOPRESSOR) tablet 12.5 mg  12.5 mg Oral BID Ottie Glazier, MD   12.5 mg at 03/10/21 0909   morphine (MS CONTIN) 12 hr tablet 30 mg  30 mg Oral Q8H Enzo Bi, MD   30 mg at 03/10/21 1117   ondansetron (ZOFRAN) tablet 4 mg  4 mg Oral Q6H PRN Agbata, Tochukwu, MD       Or   ondansetron (ZOFRAN) injection 4 mg  4 mg  Intravenous Q6H PRN Agbata, Tochukwu, MD   4 mg at 03/09/21 2320   oxyCODONE (Oxy IR/ROXICODONE) immediate release tablet 5 mg  5 mg Oral Q4H PRN Enzo Bi, MD       piperacillin-tazobactam (ZOSYN) IVPB 3.375 g  3.375 g Intravenous Q8H Enzo Bi, MD       prochlorperazine (COMPAZINE) injection 10 mg  10 mg Intravenous Q6H PRN Athena Masse, MD   10 mg at 03/10/21 0117   venlafaxine XR (EFFEXOR-XR) 24 hr capsule 150 mg  150 mg Oral Q breakfast Enzo Bi, MD   150 mg at 03/10/21 1117    OBJECTIVE: Vitals:   03/10/21 1200 03/10/21 1400  BP: 136/85 132/78  Pulse: 94 92  Resp: 20 18  Temp: 98.7 F (37.1 C) 98.8 F (37.1 C)  SpO2: 96% 98%     Body mass index is 30.07 kg/m.    ECOG FS:3 - Symptomatic, >50% confined to bed  General: Well-developed, well-nourished, no acute distress. Eyes: Pink conjunctiva, anicteric sclera. HEENT: Normocephalic, moist mucous membranes. Lungs: No audible wheezing or coughing. Heart: Regular rate and rhythm. Abdomen: Soft, nontender, no obvious distention. Musculoskeletal: No edema, cyanosis, or clubbing. Neuro: Alert, answering all questions appropriately. Cranial nerves grossly intact. Skin: No rashes or petechiae noted. Psych: Flat affect.  LAB RESULTS:  Lab Results  Component Value Date   NA 140 03/10/2021   K 3.5 03/10/2021   CL 104 03/10/2021   CO2 21 (L) 03/10/2021   GLUCOSE 88 03/10/2021   BUN 16 03/10/2021   CREATININE 0.70 03/10/2021   CALCIUM QUANTITY NOT SUFFICIENT, UNABLE TO PERFORM TEST 03/10/2021   PROT 7.2 03/09/2021   ALBUMIN 3.5 03/09/2021   AST 95 (H) 03/09/2021   ALT 26 03/09/2021   ALKPHOS 78 03/09/2021   BILITOT 0.5 03/09/2021   GFRNONAA >60 03/10/2021   GFRAA >60 08/07/2019    Lab Results  Component Value Date   WBC 17.9 (H) 03/10/2021   NEUTROABS 13.0 (H) 03/09/2021   HGB 11.6 (L) 03/10/2021   HCT 34.1 (L) 03/10/2021   MCV 98.3 03/10/2021   PLT 299 03/10/2021     STUDIES: DG Chest 2 View  Result Date:  03/09/2021 CLINICAL DATA:  Shortness of breath EXAM: CHEST - 2 VIEW COMPARISON:  Chest radiograph dated February 24, 2021 and CT examination dated February 23, 2021 FINDINGS: The heart is enlarged. Left basilar opacity which may represent atelectasis or infiltrate, unchanged. Multiple bilateral nodular opacities consistent with patient's known metastatic disease is unchanged.  No appreciable pneumothorax. No acute osseous abnormality. IMPRESSION: 1.  Stable cardiomegaly. 2. Left basilar opacity which may represent atelectasis, infiltrate or small effusion, unchanged. 3. Numerous bilateral nodular pulmonary opacities consistent with metastatic disease. Electronically Signed   By: Keane Police D.O.   On: 03/09/2021 11:06   DG Chest 2 View  Result Date: 02/21/2021 CLINICAL DATA:  A 41 year old female presents with shortness of breath and history of breast cancer. EXAM: CHEST - 2 VIEW COMPARISON:  Chest x-ray from October 01, 2020 and CT of the chest from January 27, 2021. FINDINGS: Cardiomediastinal contours and hilar structures are stable. LEFT hemidiaphragm remains obscured. Signs of LEFT pleural effusion and basilar airspace process. Subtle background nodularity has increased in the RIGHT chest over time. No additional areas of frank consolidative changes. On limited assessment there is no acute skeletal process. Evidence of prior RIGHT axillary dissection presumably projecting over the RIGHT chest. IMPRESSION: 1. Malignant effusion in the LEFT chest with further consolidation or enlarging nodules and masses in this area. Difficult to exclude developing infection in the LEFT lung base. 2. Scattered small nodular densities in the RIGHT chest appearing more conspicuous than on previous imaging may represent a combination of known metastatic disease and superimposed infection. Electronically Signed   By: Zetta Bills M.D.   On: 02/21/2021 13:22   CT CHEST WO CONTRAST  Result Date: 03/09/2021 CLINICAL DATA:   Pneumonia, shortness of breath EXAM: CT CHEST WITHOUT CONTRAST TECHNIQUE: Multidetector CT imaging of the chest was performed following the standard protocol without IV contrast. RADIATION DOSE REDUCTION: This exam was performed according to the departmental dose-optimization program which includes automated exposure control, adjustment of the mA and/or kV according to patient size and/or use of iterative reconstruction technique. COMPARISON:  CT chest angiogram, 02/23/2021, CT chest abdomen pelvis, 01/27/2021 FINDINGS: Cardiovascular: No significant vascular findings. Normal heart size. No pericardial effusion. Mediastinum/Nodes: No enlarged mediastinal, hilar, or axillary lymph nodes. Thyroid gland, trachea, and esophagus demonstrate no significant findings. Lungs/Pleura: Redemonstrated, widespread pulmonary and pleural nodularity, pleural disease more extensive on the left, which appears slightly increased compared to prior examination dated 02/23/2021. There is similar dense consolidation and atelectasis of the inferior left lower lobe and lingula with a small left pleural effusion. Upper Abdomen: No acute abnormality. Multiple hypodense hepatic metastases and multiple peritoneal nodules, not appreciably changed. Musculoskeletal: No chest wall abnormality. Unchanged lytic metastasis of the T10 vertebral body (series 6, image 113). IMPRESSION: 1. Redemonstrated, widespread bilateral pulmonary and pleural metastatic disease, which appears slightly worsened compared to prior examination dated 02/23/2021. 2. There is similar dense consolidation and atelectasis of the inferior left lower lobe and lingula with a small left pleural effusion. This is worrisome for infection or aspiration. 3. Additional hepatic, peritoneal, and osseous metastatic disease, not obviously changed as partially imaged. Electronically Signed   By: Delanna Ahmadi M.D.   On: 03/09/2021 13:17   CT Angio Chest PE W and/or Wo Contrast  Addendum  Date: 02/24/2021   ADDENDUM REPORT: 02/24/2021 07:47 ADDENDUM: As stated in the original dictation, left-sided pleural metastasis, widespread pulmonary metastases and hepatic metastases all appear increased compared to prior examinations. In addition, there is consolidative airspace disease in the left lower lobe, concerning for pneumonia. Electronically Signed   By: Vinnie Langton M.D.   On: 02/24/2021 07:47   Result Date: 02/24/2021 CLINICAL DATA:  Pulmonary embolism suspected. High probability. Metastatic breast cancer. Shortness of breath. EXAM: CT ANGIOGRAPHY CHEST WITH CONTRAST TECHNIQUE: Multidetector CT imaging of the  chest was performed using the standard protocol during bolus administration of intravenous contrast. Multiplanar CT image reconstructions and MIPs were obtained to evaluate the vascular anatomy. RADIATION DOSE REDUCTION: This exam was performed according to the departmental dose-optimization program which includes automated exposure control, adjustment of the mA and/or kV according to patient size and/or use of iterative reconstruction technique. CONTRAST:  111m OMNIPAQUE IOHEXOL 350 MG/ML SOLN COMPARISON:  Chest radiography same day.  Chest CT 01/27/2021. FINDINGS: Cardiovascular: Cardiomegaly with left ventricular prominence. No visible aortic atherosclerotic calcification. Pulmonary arterial opacification is good. There are no pulmonary emboli. Mediastinum/Nodes: Over the last month, there has been considerable growth and mediastinal tumor. Anterior mediastinal disease previously measured at 15 and 17 mm on axial image 32 now measures 17 and 20 mm. Increased tumor growth in the subcarinal region and left hilum and along the left pericardial margin. Lungs/Pleura: Marked worsening of pulmonary metastatic disease. Innumerable nodules scattered throughout the lungs measuring 9 mm in size and smaller. There is worsening collapse of the left lower lobe. Lobular pleural disease on the left has  worsened. Upper Abdomen: Worsening of hepatic metastatic disease. More numerous and larger metastatic lesions. For example, metastasis at the dome of the liver on the right previously measured 18 mm in diameter and now measures 21 mm in diameter. Musculoskeletal: Lytic sclerotic metastatic disease throughout the spine and ribs appears similar. Review of the MIP images confirms the above findings. IMPRESSION: No pulmonary emboli. Progression of metastatic tumor within the mediastinum, left hilum, manifest as nodular metastatic lesions throughout both lungs, and within the liver. Worsened volume loss in the left lower lobe with progression of pleural disease. This interpretation is rendered on an emergent basis in order to rule out pulmonary emboli. The exam will be reviewed and addended by body/oncologic specialist radiologist tomorrow. Electronically Signed: By: MNelson ChimesM.D. On: 02/23/2021 21:42   CARDIAC CATHETERIZATION  Result Date: 02/25/2021   The left ventricular systolic function is normal.   LV end diastolic pressure is normal.   The left ventricular ejection fraction is 55-65% by visual estimate. Conclusion Normal left ventricular function EF of 60% Normal coronaries Intervention deferred No conclusion   DG Chest Port 1 View  Result Date: 02/24/2021 CLINICAL DATA:  Shortness of breath, chest pain EXAM: PORTABLE CHEST 1 VIEW COMPARISON:  Previous studies including the examination of 02/23/2021 FINDINGS: Transverse diameter of heart is increased. There is opacification of left lower lung fields consistent with pleural effusion and possibly underlying infiltrate with no significant change. Are possible small nodular densities in the right mid and right lower lung fields. Right lateral CP angle is clear. There is no pneumothorax. IMPRESSION: No significant interval changes are noted in the opacification of left lower lung fields suggesting pleural effusion and possibly underlying infiltrate.  Electronically Signed   By: PElmer PickerM.D.   On: 02/24/2021 15:06   DG Chest Portable 1 View  Result Date: 02/23/2021 CLINICAL DATA:  Chest pain and shortness of breath. History of metastatic breast cancer. EXAM: PORTABLE CHEST 1 VIEW COMPARISON:  02/21/2021 and prior radiograph FINDINGS: The cardiomediastinal silhouette is unchanged. LEFT LOWER lung consolidation/atelectasis and LEFT pleural thickening again noted. No new pulmonary findings are noted. There is no evidence of pneumothorax or acute bony abnormality. IMPRESSION: Unchanged appearance of the chest with LEFT LOWER lung consolidation/atelectasis and LEFT pleural thickening. Electronically Signed   By: JMargarette CanadaM.D.   On: 02/23/2021 20:42   [place] SITE RITE  Result Date: 03/10/2021  If [employer] not attached, placement could not be confirmed due to current cardiac rhythm.   ASSESSMENT: Stage IV breast cancer with progressive nausea, weakness and now hypercalcemia.  PLAN:    1.  Stage IV breast cancer: Patient was approximately midway through cycle 2 of her oral chemotherapy, Xeloda.  She has been instructed to discontinue the final days of her treatment and will reconsider starting with cycle 3 after discharge.  CT scan results noted and revealed possible progression of disease in her lungs, but is too soon to tell given recent change of treatment.  Follow-up in cancer center as previously scheduled. 2.  Hypercalcemia: Likely multifactorial secondary to significant dehydration as well as malignancy.  Continue IV fluids and Lasix as ordered.  Patient also received Zometa.  Appreciate nephrology input. 3.  Nausea/vomiting: Continue antiemetics as ordered. 4.  Dehydration: Continue IV fluids.  By report, patient has poor IV access and we discussed the possibility of reinserting a port. 5.  Pain: Continue current narcotics as scheduled.  Have consulted palliative care. 6.  Leukocytosis: Likely reactive, monitor. 7.   Possible pneumonia: Continue antibiotics as ordered.  Appreciate consult, will follow.   Lloyd Huger, MD   03/10/2021 2:56 PM

## 2021-03-10 NOTE — Progress Notes (Signed)
OK with Dr. Murlean Iba to place PICC line. ?

## 2021-03-10 NOTE — Telephone Encounter (Signed)
Alex called stating that patient is in ER admitted and has questions for Dr Grayland Ormond. [phone] ? ?

## 2021-03-10 NOTE — Telephone Encounter (Signed)
Call returned to Kaylee Romero [facility] and advised that Dr Grayland Ormond states he will come by to see her this afternoon ?

## 2021-03-10 NOTE — Progress Notes (Signed)
?  Progress Note ? ? ?Patient: Kaylee Romero EXB:[id number] DOB: June 17, 1980 DOA: 03/09/2021     1 ?DOS: the patient was seen and examined on 03/10/2021 ?  ?Brief hospital course: ?No notes on file ? ?Assessment and Plan: ?* CAP (community acquired pneumonia) ?--Recurrent left lower lobe pneumonia vs delayed imaging resolution of previous treated PNA. ?--pulm consulted ?Plan: ?--transition to zosyn, per pulm rec ? ? ?Cancer-related pain ?Secondary to known widespread metastatic disease ?--home MS contin resumed, oral dilaudid q4h scheduled newly added, however, pt continued to ask for IV dilaudid. ?Plan: ?--IV dilaudid and pain management per oncology ? ? ? ?Hypercalcemia ?--nephrology consulted ?--Zoledronic acid 4 mg administered yesterday. ?Plan: ?--increase to lactated Ringer's 150 mL/h, per nephro rec ?--Continue furosemide 40 mg IV daily. ? ? ?Depression ?Stable ?Continue venlafaxine ? ?Essential hypertension ?Blood pressure stable ?Continue metoprolol ? ?Malignant neoplasm of upper-outer quadrant of right breast in female, estrogen receptor positive ([place]) ?Patient with a known history of stage IV ER/PR positive, HER2 negative invasive carcinoma of the breast with widespread metastatic disease. ?Continue Xeloda ?--oncology consulted ?--oncology palliative care consulted ?--pain management, per oncology ? ?ADD (attention deficit disorder) ?Stable ?Continue Strattera and guanfacine ? ? ? ? ?  ? ?Subjective:  ?Pt complained of uncontrolled pain.  Home oral opioids regimen continued and scheduled oral dilaudid added.  However, pt continued to request IV dilaudid. ? ? ?Physical Exam: ?Vitals:  ? 03/10/21 0800 03/10/21 1200 03/10/21 1400 03/10/21 1509  ?BP: (!) 142/80 136/85 132/78 (!) 160/97  ?Pulse: 95 94 92 100  ?Resp: $Remov'18 20 18 16  'SdxccP$ ?Temp: 98.6 ?F (37 ?C) 98.7 ?F (37.1 ?C) 98.8 ?F (37.1 ?C) 98.7 ?F (37.1 ?C)  ?TempSrc: Oral Oral Oral   ?SpO2: 94% 96% 98% 96%  ?Weight:      ?Height:      ? ? ?Constitutional: NAD,  AAOx3 ?HEENT: conjunctivae and lids normal, EOMI ?CV: No cyanosis.   ?RESP: normal respiratory effort, on RA ?Neuro: II - XII grossly intact.   ? ? ?Data Reviewed: ? ?Family Communication:  ? ?Disposition: ?Status is: Inpatient ? ? Planned Discharge Destination: Home ? ? ? ?Time spent: 50 minutes ? ?Author: ?Enzo Bi, MD ?03/10/2021 8:18 PM ? ?For on call review [email or web address].  ?

## 2021-03-10 NOTE — Progress Notes (Signed)
PHARMACIST - PHYSICIAN COMMUNICATION ? ?CONCERNING: Antibiotic IV to Oral Route Change Policy ? ?RECOMMENDATION: ?This patient is receiving pantoprazole by the intravenous route.  Based on criteria approved by the Pharmacy and Therapeutics Committee, the antibiotic(s) is/are being converted to the equivalent oral dose form(s). ? ? ?DESCRIPTION: ?These criteria include: ?Patient being treated for a respiratory tract infection, urinary tract infection, cellulitis or clostridium difficile associated diarrhea if on metronidazole ?The patient is not neutropenic and does not exhibit a GI malabsorption state ?The patient is eating (either orally or via tube) and/or has been taking other orally administered medications for a least 24 hours ?The patient is improving clinically and has a Tmax < 100.5 ? ?If you have questions about this conversion, please contact the Pharmacy Department  ? ?Benita Gutter  ?03/10/21  ?

## 2021-03-10 NOTE — Progress Notes (Signed)
PULMONOLOGY         Date: 03/10/2021,   MRN# [id number] Kaylee Romero [birth year]     AdmissionWeight: 77 kg                 CurrentWeight: 77 kg   Referring physician: Dr Francine Graven   CHIEF COMPLAINT:   Recurrent malignant pleural effusion   HISTORY OF PRESENT ILLNESS   This is a 41 year old unfortunate patient with history of breast cancer and multiple additional comorbid conditions, evaluated by me last year for dyspnea found to have pleural effusion.  Initial thoracentesis done June 2022 was negative for atypia, patient subsequently had surgical biopsy of area of PET avid abnormal pleural thickening showing PR and ER positive non-small cell CA. recently patient did develop left lower lobe opacifications which was deemed to be pneumonia status posttreatment with levofloxacin and subsequent transient improvement followed by worsening and readmission showing reaccumulation of left pleural effusion.  On arrival symptoms included cough and dyspnea patient was placed on empiric antimicrobials for community-acquired pneumonia with cefepime and vancomycin.  CT chest was performed which I independently reviewed showing innumerable metastatic implants bilaterally in the lungs as well as previously noted abnormal thickening of the pleura, left lower lobe consolidation with severe narrowing of the left lower lobe bronchioles.  Blood work including CMP shows significant hypercalcemia at 13.1. Pulmonary consultation placed for additional evaluation management.    03/10/21- patient is stable without overnight events. Analgesia regiment modified. Reviewed antibiotics with change to zosyn. S/p nephrology, med/onc, palliative and hospitalist evaluaton appeciate everyone involved in patients care.   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   Malignant neoplasm of upper-outer quadrant of right breast in female, estrogen receptor positive [facility])    oncologist--- dr Grayland Ormond;;  first dx 09/ 2020 s/p right lumpectomy w/ node dissection and completed chemoradiation 08/ 2021;;  recurrent 07/ 2022 to lung pleura,liver, and thoracic spine   SOB (shortness of breath)    02-12-2021  pt denies with regular activity but sob with stairs     SURGICAL HISTORY   Past Surgical History:  Procedure Laterality Date   ABDOMINAL HYSTERECTOMY N/A 02/17/2021   BREAST LUMPECTOMY WITH AXILLARY LYMPH NODE BIOPSY Right 11/21/2018   Procedure: RIGHT BREAST LUMPECTOMY WITH SENTINEL LYMPH NODE BIOPSY;  Surgeon: Jovita Kussmaul, MD;  Location: [place];  Service: General;  Laterality: Right;   BREAST REDUCTION SURGERY Bilateral 11/21/2018   Procedure: BILATERAL MAMMARY REDUCTION  (BREAST);  Surgeon: Wallace Going, DO;  Location: [place];  Service: Plastics;  Laterality: Bilateral;   CHEST TUBE INSERTION Left 07/29/2020   Procedure: INSERTION PLEURAL DRAINAGE CATHETER;  Surgeon: Lajuana Matte, MD;  Location: [place];  Service: Thoracic;  Laterality: Left;   IRRIGATION AND DEBRIDEMENT STERNOCLAVICULAR JOINT-STERNUM AND RIBS N/A 08/13/2020   Procedure: IRRIGATION AND DEBRIDEMENT CHEST WALL ABSCESS;  Surgeon: Lajuana Matte, MD;  Location: [place];  Service: Cardiothoracic;  Laterality: N/A;   IUD REMOVAL N/A 02/17/2021   Procedure: INTRAUTERINE DEVICE (IUD) REMOVAL;  Surgeon:  Linda Hedges, DO;  Location: [facility];  Service: Gynecology;  Laterality: N/A;   LAPAROSCOPIC SALPINGO OOPHERECTOMY Bilateral 02/17/2021   Procedure: LAPAROSCOPIC BILATERAL SALPINGO OOPHORECTOMY;  Surgeon: Linda Hedges, DO;  Location: [place];  Service: Gynecology;  Laterality: Bilateral;   LEFT HEART CATH AND CORONARY ANGIOGRAPHY N/A 02/25/2021   Procedure: LEFT HEART CATH AND CORONARY ANGIOGRAPHY possible PCI and stent;  Surgeon: Yolonda Kida, MD;  Location: [place] [facility];  Service: Cardiovascular;  Laterality: N/A;   PLEURAL BIOPSY Left 07/29/2020   Procedure: PLEURAL BIOPSY;  Surgeon: Lajuana Matte, MD;  Location: [place];  Service: Thoracic;  Laterality: Left;   PLEURAL EFFUSION DRAINAGE Left 07/29/2020   Procedure: DRAINAGE OF PLEURAL EFFUSION;  Surgeon: Lajuana Matte, MD;  Location: [place] [facility];  Service: Thoracic;  Laterality: Left;   PORT-A-CATH REMOVAL N/A 07/06/2019   Procedure: REMOVAL PORT-A-CATH;  Surgeon: Jovita Kussmaul, MD;  Location: [place];  Service: General;  Laterality: N/A;   PORTACATH PLACEMENT N/A 11/21/2018   Procedure: INSERTION LEFT PORT-A-CATH WITH ULTRASOUND GUIDANCE;  Surgeon: Jovita Kussmaul, MD;  Location: [place];  Service: General;  Laterality: N/A;   TONSILLECTOMY  1987   VIDEO ASSISTED THORACOSCOPY Left 07/29/2020   Procedure: VIDEO ASSISTED THORACOSCOPY;  Surgeon: Lajuana Matte, MD;  Location: [place];  Service: Thoracic;  Laterality: Left;   WISDOM TOOTH EXTRACTION       FAMILY HISTORY   Family History  Problem Relation Age of Onset   Hypertension Father    Alcohol abuse Paternal Grandfather    Heart disease Paternal Grandfather    Alcohol abuse Maternal Grandfather    Heart disease Maternal Grandmother    Breast cancer Other      SOCIAL HISTORY   Social History   Tobacco Use   Smoking status: Former    Packs/day: 0.50    Years: 20.00    Pack years: 10.00    Types: Cigarettes    Start date: 01/13/2002    Quit date: 10/17/2016    Years since quitting: 4.3   Smokeless tobacco: Never  Vaping Use   Vaping Use: Never used  Substance Use Topics   Alcohol use: Yes    Comment: Socially   Drug use: Never      MEDICATIONS    Home Medication:  Current Outpatient Rx   Order #: [id number] Class: Historical Med   Order #: [id number] Class: Normal   Order #: [id number] Class: Print   Order #: [id number] Class: Historical Med   Order #: [id number] Class: Historical Med   Order #: [id number] Class: Normal   Order #: [id number] Class: Historical Med   Order #: [id number] Class: Historical Med   Order #: [id number] Class: Normal   Order #: [id number] Class: Normal   Order #: [id number] Class: Normal   Order #: [id number] Class: Normal   Order #: [id number] Class: Normal   Order #: [id number] Class: Normal   Order #: [id number] Class: Normal   Order #: [id number] Class: Normal   Order #: [id number] Class: Historical Med   Order #: [id number] Class: Normal   Order #: [id number] Class: Historical Med   Order #: [id number] Class: Historical Med   Order #: [id number] Class: Normal   Order #: [id number] Class: Historical Med   Order #: [id number] Class: Normal   Order #: [id number] Class: Normal    Current Medication:  Current Facility-Administered Medications:    acetaminophen (TYLENOL) tablet 1,000 mg, 1,000 mg, Oral, Q6H PRN, Agbata, Tochukwu, MD, 1,000 mg at 03/10/21 0829   ALPRAZolam (XANAX) tablet 0.25 mg, 0.25 mg,  Oral, BID PRN, Agbata, Tochukwu, MD, 0.25 mg at 03/10/21 0504   azithromycin (ZITHROMAX) 500 mg in sodium chloride 0.9 % 250 mL IVPB, 500 mg, Intravenous, Q24H, Agbata, Tochukwu, MD, Stopped at 03/10/21 0019   cefTRIAXone (ROCEPHIN) 1 g in sodium chloride 0.9 % 100 mL IVPB, 1 g, Intravenous, Q24H, Agbata, Tochukwu, MD, Stopped at 03/09/21 2304   diphenhydrAMINE (BENADRYL) injection 12.5 mg, 12.5 mg, Intravenous, TID, Lanney Gins, Venesa Semidey, MD, 12.5 mg at 03/09/21 2239   enoxaparin (LOVENOX) injection 40 mg, 40 mg, Subcutaneous, Q24H, Agbata, Tochukwu, MD, 40 mg at 03/09/21 2212   furosemide (LASIX) injection 40 mg, 40 mg, Intravenous, Daily, Lanney Gins, Armentha Branagan, MD, 40 mg at 03/09/21 1758   guaiFENesin (ROBITUSSIN) 100 MG/5ML  liquid 5 mL, 5 mL, Oral, Q4H PRN, Agbata, Tochukwu, MD   HYDROmorphone (DILAUDID) injection 0.5 mg, 0.5 mg, Intravenous, Q2H PRN, Judd Gaudier V, MD, 0.5 mg at 03/10/21 0326   HYDROmorphone (DILAUDID) injection 1 mg, 1 mg, Intravenous, Q4H, Antawn Sison, MD, 1 mg at 03/10/21 0503   ipratropium-albuterol (DUONEB) 0.5-2.5 (3) MG/3ML nebulizer solution 3 mL, 3 mL, Nebulization, Q4H PRN, Agbata, Tochukwu, MD, 3 mL at 03/10/21 0454   lactated ringers infusion, , Intravenous, Continuous, Bhutani, Manpreet S, MD, Last Rate: 75 mL/hr at 03/10/21 0332, Infusion Verify at 03/10/21 0332   metoprolol tartrate (LOPRESSOR) tablet 12.5 mg, 12.5 mg, Oral, BID, Lanney Gins, Royale Lennartz, MD, 12.5 mg at 03/09/21 2217   ondansetron (ZOFRAN) tablet 4 mg, 4 mg, Oral, Q6H PRN **OR** ondansetron (ZOFRAN) injection 4 mg, 4 mg, Intravenous, Q6H PRN, Agbata, Tochukwu, MD, 4 mg at 03/09/21 2320   prochlorperazine (COMPAZINE) injection 10 mg, 10 mg, Intravenous, Q6H PRN, Judd Gaudier V, MD, 10 mg at 03/10/21 0117   venlafaxine XR (EFFEXOR-XR) 24 hr capsule 150 mg, 150 mg, Oral, QHS, Agbata, Tochukwu, MD  Current Outpatient Medications:    acetaminophen (TYLENOL) 500 MG tablet, Take 1,000 mg by mouth every 6 (six) hours as needed., Disp: , Rfl:    albuterol (VENTOLIN HFA) 108 (90 Base) MCG/ACT inhaler, INHALE 2 PUFFS INTO THE LUNGS EVERY 6 HOURS AS NEEDED FOR WHEEZING OR SHORTNESS OF BREATH (Patient taking differently: Inhale 2 puffs into the lungs 2 (two) times daily.), Disp: 6.7 g, Rfl: 2   ALPRAZolam (XANAX) 0.25 MG tablet, Take 1 tablet (0.25 mg total) by mouth 2 (two) times daily as needed for anxiety., Disp: 60 tablet, Rfl: 0   amphetamine-dextroamphetamine (ADDERALL XR) 30 MG 24 hr capsule, Take 30 mg by mouth every morning., Disp: , Rfl:    Calcium Citrate-Vitamin D (CALCIUM + D PO), Take 1 tablet by mouth 3 (three) times daily., Disp: , Rfl:    capecitabine (XELODA) 500 MG tablet, TAKE 4 TABLETS BY MOUTH 2 TIMES DAILY AFTER  A MEAL FOR 14 DAYS, THEN HOLD FOR 7 DAYS, REPEAT EVERY 21 DAYS., Disp: 112 tablet, Rfl: 0   Cholecalciferol (VITAMIN D3) 50 MCG (2000 UT) TABS, Take 1 tablet by mouth daily at 12 noon., Disp: , Rfl:    ibuprofen (ADVIL) 200 MG tablet, Take 400 mg by mouth every 6 (six) hours as needed for mild pain., Disp: , Rfl:    levofloxacin (LEVAQUIN) 750 MG tablet, Take 1 tablet (750 mg total) by mouth daily for 7 days., Disp: 7 tablet, Rfl: 0   meloxicam (MOBIC) 7.5 MG tablet, Take 1 tablet (7.5 mg total) by mouth daily., Disp: 30 tablet, Rfl: 0   metoprolol tartrate (LOPRESSOR) 25 MG tablet, Take 1 tablet (25 mg total) by  mouth 2 (two) times daily., Disp: 60 tablet, Rfl: 1   morphine (MS CONTIN) 15 MG 12 hr tablet, Take 2 tablets (30 mg total) by mouth every 8 (eight) hours., Disp: 90 tablet, Rfl: 0   ondansetron (ZOFRAN) 8 MG tablet, Take 1 tablet (8 mg total) by mouth every 8 (eight) hours as needed for nausea or vomiting., Disp: 30 tablet, Rfl: 1   oxyCODONE (OXY IR/ROXICODONE) 5 MG immediate release tablet, Take 1 tablet (5 mg total) by mouth every 6 (six) hours as needed for severe pain., Disp: 90 tablet, Rfl: 0   polyethylene glycol (MIRALAX / GLYCOLAX) 17 g packet, Take 17 g by mouth daily. (Patient taking differently: Take 17 g by mouth daily as needed for mild constipation.), Disp: 14 each, Rfl: 0   promethazine (PHENERGAN) 25 MG tablet, Take 1 tablet (25 mg total) by mouth every 6 (six) hours as needed for nausea or vomiting., Disp: 30 tablet, Rfl: 2   Tetrahydrozoline HCl (VISINE OP), Place 1 drop into both eyes daily as needed (redness)., Disp: , Rfl:    venlafaxine XR (EFFEXOR-XR) 150 MG 24 hr capsule, TAKE 1 CAPSULE(150 MG) BY MOUTH DAILY WITH BREAKFAST (Patient taking differently: 150 mg at bedtime.), Disp: 30 capsule, Rfl: 1   atomoxetine (STRATTERA) 40 MG capsule, Take 40 mg by mouth daily. (Patient not taking: Reported on 03/09/2021), Disp: , Rfl:    docusate sodium (COLACE) 100 MG capsule, Take  100 mg by mouth 2 (two) times daily as needed for mild constipation. (Patient not taking: Reported on 03/09/2021), Disp: , Rfl:    guaiFENesin (ROBITUSSIN) 100 MG/5ML liquid, Take 5 mLs by mouth every 4 (four) hours as needed for cough or to loosen phlegm. (Patient not taking: Reported on 03/09/2021), Disp: 120 mL, Rfl: 0   guanFACINE (INTUNIV) 2 MG TB24 ER tablet, Take 2 mg by mouth daily. (Patient not taking: Reported on 03/09/2021), Disp: , Rfl:    ipratropium-albuterol (DUONEB) 0.5-2.5 (3) MG/3ML SOLN, Take 3 mLs by nebulization every 4 (four) hours as needed. (Patient not taking: Reported on 03/09/2021), Disp: 360 mL, Rfl: 0   prochlorperazine (COMPAZINE) 10 MG tablet, Take 1 tablet (10 mg total) by mouth 2 (two) times daily. Take 30 to 60 minutes prior to capecitabine (Xeloda) dose. (Patient not taking: Reported on 03/09/2021), Disp: 30 tablet, Rfl: 2    ALLERGIES   Betadine [povidone-iodine]     REVIEW OF SYSTEMS    Review of Systems:  Gen:  Denies  fever, sweats, chills weigh loss  HEENT: Denies blurred vision, double vision, ear pain, eye pain, hearing loss, nose bleeds, sore throat Cardiac:  No dizziness, chest pain or heaviness, chest tightness,edema Resp:   Denies cough or sputum porduction, shortness of breath,wheezing, hemoptysis,  Gi: Denies swallowing difficulty, stomach pain, nausea or vomiting, diarrhea, constipation, bowel incontinence Gu:  Denies bladder incontinence, burning urine Ext:   Denies Joint pain, stiffness or swelling Skin: Denies  skin rash, easy bruising or bleeding or hives Endoc:  Denies polyuria, polydipsia , polyphagia or weight change Psych:   Denies depression, insomnia or hallucinations   Other:  All other systems negative   VS: BP (!) 144/81    Pulse 88    Temp 98.2 F (36.8 C) (Oral)    Resp 18    Ht $R'5\' 3"'FA$  (1.6 m)    Wt 77 kg    LMP 01/06/2019 (Approximate)    SpO2 93%    BMI 30.07 kg/m      PHYSICAL  EXAM    GENERAL:NAD, no fevers, chills, no  weakness no fatigue HEAD: Normocephalic, atraumatic.  EYES: Pupils equal, round, reactive to light. Extraocular muscles intact. No scleral icterus.  MOUTH: Moist mucosal membrane. Dentition intact. No abscess noted.  EAR, NOSE, THROAT: Clear without exudates. No external lesions.  NECK: Supple. No thyromegaly. No nodules. No JVD.  PULMONARY: Clear to auscultation with decreased air entry bilaterlly CARDIOVASCULAR: S1 and S2. Regular rate and rhythm. No murmurs, rubs, or gallops. No edema. Pedal pulses 2+ bilaterally.  GASTROINTESTINAL: Soft, nontender, nondistended. No masses. Positive bowel sounds. No hepatosplenomegaly.  MUSCULOSKELETAL: No swelling, clubbing, or edema. Range of motion full in all extremities.  NEUROLOGIC: Cranial nerves II through XII are intact. No gross focal neurological deficits. Sensation intact. Reflexes intact.  SKIN: No ulceration, lesions, rashes, or cyanosis. Skin warm and dry. Turgor intact.  PSYCHIATRIC: Mood, affect within normal limits. The patient is awake, alert and oriented x 3. Insight, judgment intact.       IMAGING    DG Chest 2 View  Result Date: 03/09/2021 CLINICAL DATA:  Shortness of breath EXAM: CHEST - 2 VIEW COMPARISON:  Chest radiograph dated February 24, 2021 and CT examination dated February 23, 2021 FINDINGS: The heart is enlarged. Left basilar opacity which may represent atelectasis or infiltrate, unchanged. Multiple bilateral nodular opacities consistent with patient's known metastatic disease is unchanged. No appreciable pneumothorax. No acute osseous abnormality. IMPRESSION: 1.  Stable cardiomegaly. 2. Left basilar opacity which may represent atelectasis, infiltrate or small effusion, unchanged. 3. Numerous bilateral nodular pulmonary opacities consistent with metastatic disease. Electronically Signed   By: Keane Police D.O.   On: 03/09/2021 11:06   DG Chest 2 View  Result Date: 02/21/2021 CLINICAL DATA:  A 41 year old female presents  with shortness of breath and history of breast cancer. EXAM: CHEST - 2 VIEW COMPARISON:  Chest x-ray from October 01, 2020 and CT of the chest from January 27, 2021. FINDINGS: Cardiomediastinal contours and hilar structures are stable. LEFT hemidiaphragm remains obscured. Signs of LEFT pleural effusion and basilar airspace process. Subtle background nodularity has increased in the RIGHT chest over time. No additional areas of frank consolidative changes. On limited assessment there is no acute skeletal process. Evidence of prior RIGHT axillary dissection presumably projecting over the RIGHT chest. IMPRESSION: 1. Malignant effusion in the LEFT chest with further consolidation or enlarging nodules and masses in this area. Difficult to exclude developing infection in the LEFT lung base. 2. Scattered small nodular densities in the RIGHT chest appearing more conspicuous than on previous imaging may represent a combination of known metastatic disease and superimposed infection. Electronically Signed   By: Zetta Bills M.D.   On: 02/21/2021 13:22   CT CHEST WO CONTRAST  Result Date: 03/09/2021 CLINICAL DATA:  Pneumonia, shortness of breath EXAM: CT CHEST WITHOUT CONTRAST TECHNIQUE: Multidetector CT imaging of the chest was performed following the standard protocol without IV contrast. RADIATION DOSE REDUCTION: This exam was performed according to the departmental dose-optimization program which includes automated exposure control, adjustment of the mA and/or kV according to patient size and/or use of iterative reconstruction technique. COMPARISON:  CT chest angiogram, 02/23/2021, CT chest abdomen pelvis, 01/27/2021 FINDINGS: Cardiovascular: No significant vascular findings. Normal heart size. No pericardial effusion. Mediastinum/Nodes: No enlarged mediastinal, hilar, or axillary lymph nodes. Thyroid gland, trachea, and esophagus demonstrate no significant findings. Lungs/Pleura: Redemonstrated, widespread pulmonary  and pleural nodularity, pleural disease more extensive on the left, which appears slightly increased compared to prior examination  dated 02/23/2021. There is similar dense consolidation and atelectasis of the inferior left lower lobe and lingula with a small left pleural effusion. Upper Abdomen: No acute abnormality. Multiple hypodense hepatic metastases and multiple peritoneal nodules, not appreciably changed. Musculoskeletal: No chest wall abnormality. Unchanged lytic metastasis of the T10 vertebral body (series 6, image 113). IMPRESSION: 1. Redemonstrated, widespread bilateral pulmonary and pleural metastatic disease, which appears slightly worsened compared to prior examination dated 02/23/2021. 2. There is similar dense consolidation and atelectasis of the inferior left lower lobe and lingula with a small left pleural effusion. This is worrisome for infection or aspiration. 3. Additional hepatic, peritoneal, and osseous metastatic disease, not obviously changed as partially imaged. Electronically Signed   By: Delanna Ahmadi M.D.   On: 03/09/2021 13:17   CT Angio Chest PE W and/or Wo Contrast  Addendum Date: 02/24/2021   ADDENDUM REPORT: 02/24/2021 07:47 ADDENDUM: As stated in the original dictation, left-sided pleural metastasis, widespread pulmonary metastases and hepatic metastases all appear increased compared to prior examinations. In addition, there is consolidative airspace disease in the left lower lobe, concerning for pneumonia. Electronically Signed   By: Vinnie Langton M.D.   On: 02/24/2021 07:47   Result Date: 02/24/2021 CLINICAL DATA:  Pulmonary embolism suspected. High probability. Metastatic breast cancer. Shortness of breath. EXAM: CT ANGIOGRAPHY CHEST WITH CONTRAST TECHNIQUE: Multidetector CT imaging of the chest was performed using the standard protocol during bolus administration of intravenous contrast. Multiplanar CT image reconstructions and MIPs were obtained to evaluate the  vascular anatomy. RADIATION DOSE REDUCTION: This exam was performed according to the departmental dose-optimization program which includes automated exposure control, adjustment of the mA and/or kV according to patient size and/or use of iterative reconstruction technique. CONTRAST:  145mL OMNIPAQUE IOHEXOL 350 MG/ML SOLN COMPARISON:  Chest radiography same day.  Chest CT 01/27/2021. FINDINGS: Cardiovascular: Cardiomegaly with left ventricular prominence. No visible aortic atherosclerotic calcification. Pulmonary arterial opacification is good. There are no pulmonary emboli. Mediastinum/Nodes: Over the last month, there has been considerable growth and mediastinal tumor. Anterior mediastinal disease previously measured at 15 and 17 mm on axial image 32 now measures 17 and 20 mm. Increased tumor growth in the subcarinal region and left hilum and along the left pericardial margin. Lungs/Pleura: Marked worsening of pulmonary metastatic disease. Innumerable nodules scattered throughout the lungs measuring 9 mm in size and smaller. There is worsening collapse of the left lower lobe. Lobular pleural disease on the left has worsened. Upper Abdomen: Worsening of hepatic metastatic disease. More numerous and larger metastatic lesions. For example, metastasis at the dome of the liver on the right previously measured 18 mm in diameter and now measures 21 mm in diameter. Musculoskeletal: Lytic sclerotic metastatic disease throughout the spine and ribs appears similar. Review of the MIP images confirms the above findings. IMPRESSION: No pulmonary emboli. Progression of metastatic tumor within the mediastinum, left hilum, manifest as nodular metastatic lesions throughout both lungs, and within the liver. Worsened volume loss in the left lower lobe with progression of pleural disease. This interpretation is rendered on an emergent basis in order to rule out pulmonary emboli. The exam will be reviewed and addended by body/oncologic  specialist radiologist tomorrow. Electronically Signed: By: Nelson Chimes M.D. On: 02/23/2021 21:42   CARDIAC CATHETERIZATION  Result Date: 02/25/2021   The left ventricular systolic function is normal.   LV end diastolic pressure is normal.   The left ventricular ejection fraction is 55-65% by visual estimate. Conclusion Normal left ventricular function  EF of 60% Normal coronaries Intervention deferred No conclusion   DG Chest Port 1 View  Result Date: 02/24/2021 CLINICAL DATA:  Shortness of breath, chest pain EXAM: PORTABLE CHEST 1 VIEW COMPARISON:  Previous studies including the examination of 02/23/2021 FINDINGS: Transverse diameter of heart is increased. There is opacification of left lower lung fields consistent with pleural effusion and possibly underlying infiltrate with no significant change. Are possible small nodular densities in the right mid and right lower lung fields. Right lateral CP angle is clear. There is no pneumothorax. IMPRESSION: No significant interval changes are noted in the opacification of left lower lung fields suggesting pleural effusion and possibly underlying infiltrate. Electronically Signed   By: Elmer Picker M.D.   On: 02/24/2021 15:06   DG Chest Portable 1 View  Result Date: 02/23/2021 CLINICAL DATA:  Chest pain and shortness of breath. History of metastatic breast cancer. EXAM: PORTABLE CHEST 1 VIEW COMPARISON:  02/21/2021 and prior radiograph FINDINGS: The cardiomediastinal silhouette is unchanged. LEFT LOWER lung consolidation/atelectasis and LEFT pleural thickening again noted. No new pulmonary findings are noted. There is no evidence of pneumothorax or acute bony abnormality. IMPRESSION: Unchanged appearance of the chest with LEFT LOWER lung consolidation/atelectasis and LEFT pleural thickening. Electronically Signed   By: Margarette Canada M.D.   On: 02/23/2021 20:42    SURGICAL PATHOLOGY     Reason for Addendum #1:  Breast Biomarker Results  Reason for  Addendum #2:  Breast Biomarker Results   Clinical History: pleural effusion (cm)      FINAL MICROSCOPIC DIAGNOSIS:   A. PLEURA PEEL, LEFT:  - Metastatic adenocarcinoma.   COMMENT:   Immunohistochemistry is positive for cytokeratin 7, GATA-3, and ER. The  cells are negative for cytokeratin 20, CD56, synaptophysin,  chromogranin, TTF-1, NapsinA, p40 and cytokeratin 5/6. The findings are  consistent with metastatic mammary carcinoma. Prognostic markers will be  ordered.   INTRAOPERATIVE DIAGNOSIS:   A1. PLEURA PEEL, LEFT, FROZEN SECTION:         Non-small cell carcinoma.         Rapid intraoperative consult diagnosis rendered by Dr. Melina Copa @  0920 07/29/2020.    GROSS DESCRIPTION:   Received fresh for rapid intraoperative consult evaluation by frozen  section are multiple irregular pieces of pink-white to pale yellow soft  to indurated and vaguely nodular tissues, 2.9 x 2.4 x 0.5 cm in  aggregate.  Representative sections are submitted in block 1 for frozen  section, with remaining specimen submitted blocks 2, 3 for routine  histology.   SW 07/29/2020    Final Diagnosis performed by Vicente Males, MD.   Electronically signed  07/31/2020  Technical component performed at [facility]. [facility], [place] [address], [place], [place] [address].   Professional component performed at [facility],  [place] [address], [place], [place] [address].   Immunohistochemistry Technical component (if applicable) was performed  at [facility]. [address], [place],  [place], [place] [address].   IMMUNOHISTOCHEMISTRY DISCLAIMER (if applicable):  Some of these immunohistochemical stains may have been developed and the  performance characteristics determine by [facility]. Some  may not have been cleared or approved by the U.S. Food and Drug  Administration. The FDA has determined that such clearance or approval  is not necessary. This  test is used for clinical purposes. It should not  be regarded as investigational or for research. This laboratory is  certified under the Clinical Laboratory Improvement  Amendments of 1988  (CLIA-88) as qualified to perform high complexity clinical laboratory  testing.  The controls stained appropriately.   ADDENDUM:   PROGNOSTIC INDICATOR RESULTS:   Immunohistochemical and morphometric analysis performed manually   The tumor cells are EQUIVOCAL for Her2 (2+) with a heterogeneous  pattern. The most intense areas have been selected for Her2 FISH and  results will be reported in an addendum.   Estrogen Receptor:       POSITIVE, 80%, MODERATE TO STRONG STAINING  Progesterone Receptor:   POSITIVE, 25%, STRONG STAINING   Reference Range Estrogen and Progesterone Receptor       Negative  0%       Positive  >1%   All controls stained appropriately.         ASSESSMENT/PLAN   Acute on chronic hypoxemic respiratory failure - present on admission  - COVID19 negative - supplemental O2 during my evaluation 6 L/min nasal cannula - will perform infectious workup for pneumonia -Respiratory viral panel -serum fungitell -legionella ab -strep pneumoniae ur AG -Histoplasma Ur Ag -sputum resp cultures -AFB sputum expectorated specimen -sputum cytology  -reviewed pertinent imaging with patient today -CRP -please encourage patient to use incentive spirometer few times each hour while hospitalized.   -MetaNeb therapy for recruitment of left lower lobe consolidated infiltrate -Agree with Rocephin and Zithromax status post cefepime and vancomycin   Metastatic breast cancer -Patient is being followed by palliative care with hematology oncology service -Prognosis very poor at this point which is very unfortunate for this young patient -patient reports analgesia is inadequate and shares she awakes from sleep due to pain and has to ask for pain medication which takes a while after request.   I will schedule her dilaudid at current $RemoveBe'1mg'NYHxGBgpA$  dose so she is not suffering from cancer associated pain   Left lower lobe atelectasis Continue albuterol nebulizer as well as MetaNeb therapy 3 times daily with respiratory therapist  Hypercalcemia of malignancy    -IV fluids and furosemide daily    -No severe EKG changes noted QRS slightly reduced to 90 MS   -Recommend nephrology consultation for additional evaluation management      Patient has severe multiple life-threatening active medical conditions which are evaluated and managed during this patient visit.  Thank you for allowing me to participate in the care of this patient.   This document was prepared using Dragon voice recognition software and may include unintentional dictation errors.     Ottie Glazier, M.D.  Division of [facility]

## 2021-03-10 NOTE — Progress Notes (Signed)
Patient c/o breakthrough pain between scheduled doses of pain medication. MD Damita Dunnings notified, see new orders. ? ?Earleen Reaper, RN ?

## 2021-03-10 NOTE — Plan of Care (Signed)

## 2021-03-10 NOTE — Progress Notes (Signed)
Peripherally Inserted Central Catheter Placement ? ?The IV Nurse has discussed with the patient and/or persons authorized to consent for the patient, the purpose of this procedure and the potential benefits and risks involved with this procedure.  The benefits include less needle sticks, lab draws from the catheter, and the patient may be discharged home with the catheter. Risks include, but not limited to, infection, bleeding, blood clot (thrombus formation), and puncture of an artery; nerve damage and irregular heartbeat and possibility to perform a PICC exchange if needed/ordered by physician.  Alternatives to this procedure were also discussed.  Bard Power PICC patient education guide, fact sheet on infection prevention and patient information card has been provided to patient /or left at bedside.   ? ?PICC Placement Documentation  ?PICC Double Lumen 35/00/93 Left Basilic 41 cm 1 cm (Active)  ?Indication for Insertion or Continuance of Line Limited venous access - need for IV therapy >5 days (PICC only) 03/10/21 1627  ?Exposed Catheter (cm) 1 cm 03/10/21 1627  ?Site Assessment Clean, Dry, Intact 03/10/21 1627  ?Lumen #1 Status Flushed;Saline locked;Blood return noted 03/10/21 1627  ?Lumen #2 Status Flushed;Infusing;Saline locked;Blood return noted 03/10/21 1627  ?Dressing Type Securing device;Transparent 03/10/21 1627  ?Dressing Status Antimicrobial disc in place 03/10/21 1627  ?Dressing Intervention New dressing;Other (Comment) 03/10/21 1627  ?Dressing Change Due 03/17/21 03/10/21 1627  ? ? ? ? ? ?Kaylee Romero ?03/10/2021, 4:28 PM ? ?

## 2021-03-10 NOTE — Progress Notes (Addendum)
[facility]  ROUNDING NOTE   Subjective:   Patient currently seen resting on stretcher Husband and son at bedside    Patient and family voiced frustrations of delay in IV fluids due to loss of IV site.  Also voiced concerns of missed medications during emergency department stay. Patient currently receiving oral chemotherapy Continues to have nausea Complains of generalized pain and discomfort  Objective:  Vital signs in last 24 hours:  Temp:  [98.2 F (36.8 C)-98.8 F (37.1 C)] 98.8 F (37.1 C) (03/06 2024) Pulse Rate:  [88-102] 96 (03/06 2024) Resp:  [16-20] 16 (03/06 2024) BP: (132-170)/(77-97) 170/80 (03/06 2024) SpO2:  [92 %-100 %] 97 % (03/06 2024)  Weight change:  Filed Weights   03/09/21 0950  Weight: 77 kg    Intake/Output: I/O last 3 completed shifts: In: 1137.7 [P.O.:120; I.V.:669.6; IV Piggyback:348.1] Out: -    Intake/Output this shift:  No intake/output data recorded.  Physical Exam: General: NAD, restless  Head: Normocephalic, atraumatic. Moist oral mucosal membranes  Eyes: Anicteric  Lungs:  Clear to auscultation, normal effort  Heart: Regular rate and rhythm  Abdomen:  Soft, nontender  Extremities: No peripheral edema.  Neurologic: Nonfocal, moving all four extremities  Skin: No lesions       Basic Metabolic Panel: Recent Labs  Lab 03/09/21 1130 03/10/21 0815  NA 140 140  K 3.8 3.5  CL 97* 104  CO2 29 21*  GLUCOSE 125* 88  BUN 14 16  CREATININE 0.70 0.70  CALCIUM 13.1* QUANTITY NOT SUFFICIENT, UNABLE TO PERFORM TEST    Liver Function Tests: Recent Labs  Lab 03/09/21 1130  AST 95*  ALT 26  ALKPHOS 78  BILITOT 0.5  PROT 7.2  ALBUMIN 3.5   Recent Labs  Lab 03/09/21 1130  LIPASE 22   No results for input(s): AMMONIA in the last 168 hours.  CBC: Recent Labs  Lab 03/09/21 1026 03/10/21 0815  WBC 14.4* 17.9*  NEUTROABS 13.0*  --   HGB 13.4 11.6*  HCT 39.8 34.1*  MCV 96.4 98.3  PLT 348 299    Cardiac  Enzymes: No results for input(s): CKTOTAL, CKMB, CKMBINDEX, TROPONINI in the last 168 hours.  BNP: Invalid input(s): POCBNP  CBG: No results for input(s): GLUCAP in the last 168 hours.  Microbiology: Results for orders placed or performed during the hospital encounter of 03/09/21  Resp Panel by RT-PCR (Flu A&B, Covid) Nasopharyngeal Swab     Status: None   Collection Time: 03/09/21 10:26 AM   Specimen: Nasopharyngeal Swab; Nasopharyngeal(NP) swabs in vial transport medium  Result Value Ref Range Status   SARS Coronavirus 2 by RT PCR NEGATIVE NEGATIVE Final    Comment: (NOTE) SARS-CoV-2 target nucleic acids are NOT DETECTED.  The SARS-CoV-2 RNA is generally detectable in upper respiratory specimens during the acute phase of infection. The lowest concentration of SARS-CoV-2 viral copies this assay can detect is 138 copies/mL. A negative result does not preclude SARS-Cov-2 infection and should not be used as the sole basis for treatment or other patient management decisions. A negative result may occur with  improper specimen collection/handling, submission of specimen other than nasopharyngeal swab, presence of viral mutation(s) within the areas targeted by this assay, and inadequate number of viral copies(<138 copies/mL). A negative result must be combined with clinical observations, patient history, and epidemiological information. The expected result is Negative.  Fact Sheet for Patients:  EntrepreneurPulse.com.au  Fact Sheet for Healthcare Providers:  IncredibleEmployment.be  This test is no t  yet approved or cleared by the [place] and  has been authorized for detection and/or diagnosis of SARS-CoV-2 by FDA under an Emergency Use Authorization (EUA). This EUA will remain  in effect (meaning this test can be used) for the duration of the COVID-19 declaration under Section 564(b)(1) of the Act, 21 U.S.C.section 360bbb-3(b)(1),  unless the authorization is terminated  or revoked sooner.       Influenza A by PCR NEGATIVE NEGATIVE Final   Influenza B by PCR NEGATIVE NEGATIVE Final    Comment: (NOTE) The Xpert Xpress SARS-CoV-2/FLU/RSV plus assay is intended as an aid in the diagnosis of influenza from Nasopharyngeal swab specimens and should not be used as a sole basis for treatment. Nasal washings and aspirates are unacceptable for Xpert Xpress SARS-CoV-2/FLU/RSV testing.  Fact Sheet for Patients: EntrepreneurPulse.com.au  Fact Sheet for Healthcare Providers: IncredibleEmployment.be  This test is not yet approved or cleared by the [place] FDA and has been authorized for detection and/or diagnosis of SARS-CoV-2 by FDA under an Emergency Use Authorization (EUA). This EUA will remain in effect (meaning this test can be used) for the duration of the COVID-19 declaration under Section 564(b)(1) of the Act, 21 U.S.C. section 360bbb-3(b)(1), unless the authorization is terminated or revoked.  Performed at [facility], [place] [place], [place] [address]   MRSA Next Gen by PCR, Nasal     Status: None   Collection Time: 03/09/21 10:09 PM   Specimen: Nasal Mucosa; Nasal Swab  Result Value Ref Range Status   MRSA by PCR Next Gen NOT DETECTED NOT DETECTED Final    Comment: (NOTE) The GeneXpert MRSA Assay (FDA approved for NASAL specimens only), is one component of a comprehensive MRSA colonization surveillance program. It is not intended to diagnose MRSA infection nor to guide or monitor treatment for MRSA infections. Test performance is not FDA approved in patients less than 27 years old. Performed at [facility], [place], [place] [address]   Respiratory (~20 pathogens) panel by PCR     Status: None   Collection Time: 03/09/21 10:09 PM   Specimen: Nasal Mucosa; Respiratory  Result Value Ref Range Status   Adenovirus NOT  DETECTED NOT DETECTED Final   Coronavirus 229E NOT DETECTED NOT DETECTED Final    Comment: (NOTE) The Coronavirus on the Respiratory Panel, DOES NOT test for the novel  Coronavirus (2019 nCoV)    Coronavirus HKU1 NOT DETECTED NOT DETECTED Final   Coronavirus NL63 NOT DETECTED NOT DETECTED Final   Coronavirus OC43 NOT DETECTED NOT DETECTED Final   Metapneumovirus NOT DETECTED NOT DETECTED Final   Rhinovirus / Enterovirus NOT DETECTED NOT DETECTED Final   Influenza A NOT DETECTED NOT DETECTED Final   Influenza B NOT DETECTED NOT DETECTED Final   Parainfluenza Virus 1 NOT DETECTED NOT DETECTED Final   Parainfluenza Virus 2 NOT DETECTED NOT DETECTED Final   Parainfluenza Virus 3 NOT DETECTED NOT DETECTED Final   Parainfluenza Virus 4 NOT DETECTED NOT DETECTED Final   Respiratory Syncytial Virus NOT DETECTED NOT DETECTED Final   Bordetella pertussis NOT DETECTED NOT DETECTED Final   Bordetella Parapertussis NOT DETECTED NOT DETECTED Final   Chlamydophila pneumoniae NOT DETECTED NOT DETECTED Final   Mycoplasma pneumoniae NOT DETECTED NOT DETECTED Final    Comment: Performed at [facility], [place]. [address], [place], [place] [address]    Coagulation Studies: No results for input(s): LABPROT, INR in the last 72 hours.  Urinalysis:  Recent Labs    03/09/21 1026  COLORURINE YELLOW*  LABSPEC 1.011  PHURINE 8.0  GLUCOSEU NEGATIVE  HGBUR NEGATIVE  BILIRUBINUR NEGATIVE  KETONESUR NEGATIVE  PROTEINUR NEGATIVE  NITRITE NEGATIVE  LEUKOCYTESUR NEGATIVE      Imaging: DG Chest 2 View  Result Date: 03/09/2021 CLINICAL DATA:  Shortness of breath EXAM: CHEST - 2 VIEW COMPARISON:  Chest radiograph dated February 24, 2021 and CT examination dated February 23, 2021 FINDINGS: The heart is enlarged. Left basilar opacity which may represent atelectasis or infiltrate, unchanged. Multiple bilateral nodular opacities consistent with patient's known metastatic disease is unchanged. No appreciable  pneumothorax. No acute osseous abnormality. IMPRESSION: 1.  Stable cardiomegaly. 2. Left basilar opacity which may represent atelectasis, infiltrate or small effusion, unchanged. 3. Numerous bilateral nodular pulmonary opacities consistent with metastatic disease. Electronically Signed   By: Keane Police D.O.   On: 03/09/2021 11:06   CT CHEST WO CONTRAST  Result Date: 03/09/2021 CLINICAL DATA:  Pneumonia, shortness of breath EXAM: CT CHEST WITHOUT CONTRAST TECHNIQUE: Multidetector CT imaging of the chest was performed following the standard protocol without IV contrast. RADIATION DOSE REDUCTION: This exam was performed according to the departmental dose-optimization program which includes automated exposure control, adjustment of the mA and/or kV according to patient size and/or use of iterative reconstruction technique. COMPARISON:  CT chest angiogram, 02/23/2021, CT chest abdomen pelvis, 01/27/2021 FINDINGS: Cardiovascular: No significant vascular findings. Normal heart size. No pericardial effusion. Mediastinum/Nodes: No enlarged mediastinal, hilar, or axillary lymph nodes. Thyroid gland, trachea, and esophagus demonstrate no significant findings. Lungs/Pleura: Redemonstrated, widespread pulmonary and pleural nodularity, pleural disease more extensive on the left, which appears slightly increased compared to prior examination dated 02/23/2021. There is similar dense consolidation and atelectasis of the inferior left lower lobe and lingula with a small left pleural effusion. Upper Abdomen: No acute abnormality. Multiple hypodense hepatic metastases and multiple peritoneal nodules, not appreciably changed. Musculoskeletal: No chest wall abnormality. Unchanged lytic metastasis of the T10 vertebral body (series 6, image 113). IMPRESSION: 1. Redemonstrated, widespread bilateral pulmonary and pleural metastatic disease, which appears slightly worsened compared to prior examination dated 02/23/2021. 2. There is  similar dense consolidation and atelectasis of the inferior left lower lobe and lingula with a small left pleural effusion. This is worrisome for infection or aspiration. 3. Additional hepatic, peritoneal, and osseous metastatic disease, not obviously changed as partially imaged. Electronically Signed   By: Delanna Ahmadi M.D.   On: 03/09/2021 13:17   [place] EKG SITE RITE  Result Date: 03/10/2021 If Site Rite image not attached, placement could not be confirmed due to current cardiac rhythm.    Medications:    sodium chloride     piperacillin-tazobactam (ZOSYN)  IV 3.375 g (03/10/21 1513)    azithromycin  500 mg Oral Daily   Chlorhexidine Gluconate Cloth  6 each Topical Daily   enoxaparin (LOVENOX) injection  40 mg Subcutaneous Q24H   feeding supplement  237 mL Oral TID BM   furosemide  40 mg Intravenous Daily   HYDROmorphone  2 mg Oral Q4H   metoprolol tartrate  12.5 mg Oral BID   morphine  30 mg Oral Q8H   polyethylene glycol  17 g Oral Daily   senna  2 tablet Oral QHS   sodium chloride flush  10-40 mL Intracatheter Q12H   venlafaxine XR  150 mg Oral Q breakfast   acetaminophen, ALPRAZolam, guaiFENesin, HYDROmorphone (DILAUDID) injection, ipratropium-albuterol, ondansetron **OR** ondansetron (ZOFRAN) IV, prochlorperazine, sodium chloride flush  Assessment/ Plan:  Ms. Kaylee Romero is a 41 y.o.  female with a past medical history of stage IV breast cancer who was admitted for Hypercalcemia [E83.52] HCAP (healthcare-associated pneumonia) [J18.9]   Hypercalcemia, likely due to multiple factors But most likely due to progressive malignancy Currently prescribed IV fluids but feels these could be maximized.  Zoledronic acid 4 mg administered yesterday.  Would suggest to change iv fluids to NS 150 mL/h, at patient tolerance.  Continue prescribed furosemide 40 mg IV daily. Could consider calcitonin. Will discuss with team  2.  Community-acquired pneumonia, recurrent  Continue IV  antibiotics.  Pulmonology following  3.  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

## 2021-03-10 NOTE — Consult Note (Signed)
[place] [facility] [phone] [phone]   Name: Kaylee Romero Date: 03/10/2021 MRN: [id number]  DOB: 1980/05/28  Patient Care Team: Dion Body, MD as PCP - General (Family Medicine) Gery Pray, MD as Consulting Physician (Radiation Oncology) Jovita Kussmaul, MD as Consulting Physician (General Surgery) Dillingham, Loel Lofty, DO as Attending Physician (Plastic Surgery) Lloyd Huger, MD as Consulting Physician (Oncology)    REASON FOR CONSULTATION: Kaylee Romero is a 41 y.o. 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  Patient then developed recurrence on tamoxifen.  Patient developed worsening shortness of breath and was found to have recurrence in July 2022 with malignant pleural effusion ultimately requiring VATS procedure and Pleurx.  Patient is status post BSO on 02/17/2021. Patient has had second and third opinions from [facility] and [place].  She is on third line chemotherapy for breast cancer.  She is status post XRT to the spine/ribs.  Patient was admitted to the hospital on 03/09/2021 with hypercalcemia.  She is referred to palliative care to help address goals and manage ongoing symptoms..   SOCIAL HISTORY:     reports that she quit smoking about 4 years ago. Her smoking use included cigarettes. She started smoking about 19 years ago. She has a 10.00 pack-year smoking history. She has never used smokeless tobacco. She reports current alcohol use. She reports that she does not use drugs.  Patient is married and lives at home with her husband and 16-1/2-year-old child.  Patient  worked as a second [employer] prior to taking leave due to her cancer diagnosis.   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   Malignant neoplasm of upper-outer quadrant of right breast in female, estrogen receptor positive [facility])    oncologist--- dr Grayland Ormond;;  first dx 09/ 2020 s/p right lumpectomy w/ node dissection and completed chemoradiation 08/ 2021;;  recurrent 07/ 2022 to lung pleura,liver, and thoracic spine   SOB (shortness of breath)    02-12-2021  pt denies with regular activity but sob with stairs    PAST SURGICAL HISTORY:  Past Surgical History:  Procedure Laterality Date   ABDOMINAL HYSTERECTOMY N/A 02/17/2021   BREAST LUMPECTOMY WITH AXILLARY LYMPH NODE BIOPSY Right 11/21/2018   Procedure: RIGHT BREAST LUMPECTOMY WITH SENTINEL LYMPH NODE BIOPSY;  Surgeon: Jovita Kussmaul, MD;  Location: [place];  Service: General;  Laterality: Right;   BREAST REDUCTION SURGERY Bilateral 11/21/2018   Procedure: BILATERAL MAMMARY REDUCTION  (BREAST);  Surgeon: Wallace Going, DO;  Location: [place];  Service: Plastics;  Laterality: Bilateral;   CHEST TUBE INSERTION Left 07/29/2020   Procedure: INSERTION PLEURAL DRAINAGE CATHETER;  Surgeon: Lajuana Matte, MD;  Location: [place];  Service: Thoracic;  Laterality: Left;   IRRIGATION AND DEBRIDEMENT STERNOCLAVICULAR JOINT-STERNUM AND RIBS N/A 08/13/2020   Procedure: IRRIGATION AND DEBRIDEMENT CHEST WALL ABSCESS;  Surgeon: Lajuana Matte, MD;  Location: [place] [facility];  Service: Cardiothoracic;  Laterality:  N/A;   IUD REMOVAL N/A 02/17/2021   Procedure: INTRAUTERINE DEVICE (IUD) REMOVAL;  Surgeon: Linda Hedges, DO;  Location: [place];  Service: Gynecology;  Laterality: N/A;   LAPAROSCOPIC SALPINGO OOPHERECTOMY Bilateral 02/17/2021   Procedure: LAPAROSCOPIC BILATERAL SALPINGO OOPHORECTOMY;  Surgeon: Linda Hedges, DO;  Location: [place];  Service: Gynecology;  Laterality: Bilateral;   LEFT HEART CATH AND CORONARY ANGIOGRAPHY N/A 02/25/2021   Procedure: LEFT HEART CATH AND CORONARY ANGIOGRAPHY possible PCI and stent;  Surgeon: Yolonda Kida, MD;  Location: [place] [facility];  Service: Cardiovascular;  Laterality: N/A;   PLEURAL BIOPSY Left 07/29/2020   Procedure: PLEURAL BIOPSY;  Surgeon: Lajuana Matte, MD;  Location: [place];  Service: Thoracic;  Laterality: Left;   PLEURAL EFFUSION DRAINAGE Left 07/29/2020   Procedure: DRAINAGE OF PLEURAL EFFUSION;  Surgeon: Lajuana Matte, MD;  Location: [place];  Service: Thoracic;  Laterality: Left;   PORT-A-CATH REMOVAL N/A 07/06/2019   Procedure: REMOVAL PORT-A-CATH;  Surgeon: Jovita Kussmaul, MD;  Location: [place];  Service: General;  Laterality: N/A;   PORTACATH PLACEMENT N/A 11/21/2018   Procedure: INSERTION LEFT PORT-A-CATH WITH ULTRASOUND GUIDANCE;  Surgeon: Jovita Kussmaul, MD;  Location: [place];  Service: General;  Laterality: N/A;   [place] Left 07/29/2020   Procedure: VIDEO ASSISTED THORACOSCOPY;  Surgeon: Lajuana Matte, MD;  Location: [place];  Service: Thoracic;  Laterality: Left;   WISDOM TOOTH EXTRACTION      HEMATOLOGY/ONCOLOGY HISTORY:  Oncology History  Malignant neoplasm of upper-outer quadrant of right breast in female, estrogen receptor positive ([place])  09/29/2018 Initial Diagnosis   Malignant neoplasm of upper-outer quadrant of right breast in female, estrogen receptor positive ([place])   09/2018 -  Anti-estrogen oral  therapy   Tamoxifen; held from 12/05/2018-09/06/2019 for surgery   10/05/2018 Cancer Staging   Staging form: Breast, AJCC 8th Edition - Clinical stage from 10/05/2018: Stage IB (cT2, cN0, cM0, G2, ER+, PR+, HER2-) - Signed by Chauncey Cruel, MD on 04/02/2020 Stage prefix: Initial diagnosis Histologic grading system: 3 grade system    10/13/2018 Genetic Testing   Negative genetic testing. No pathogenic variants identified on the [facility] Breast Cancer STAT Panel + Common Hereditary Cancers Panel. The Common Hereditary Cancers Panel offered by [facility] includes sequencing and/or deletion duplication testing of the following 47 genes: APC, ATM, AXIN2, BARD1, BMPR1A, BRCA1, BRCA2, BRIP1, CDH1, CDKN2A (p14ARF), CDKN2A (p16INK4a), CKD4, CHEK2, CTNNA1, DICER1, EPCAM (Deletion/duplication testing only), GREM1 (promoter region deletion/duplication testing only), KIT, MEN1, MLH1, MSH2, MSH3, MSH6, MUTYH, NBN, NF1, NHTL1, PALB2, PDGFRA, PMS2, POLD1, POLE, PTEN, RAD50, RAD51C, RAD51D, SDHB, SDHC, SDHD, SMAD4, SMARCA4. STK11, TP53, TSC1, TSC2, and VHL.  The following genes were evaluated for sequence changes only: SDHA and HOXB13 c.251G>A variant only. The report date is 10/13/2018.    11/21/2018 Surgery   Right lumpectomy [name]) [phone]): IDC, grade 2, 2.6 cm, with DCIS. Negative margins. 1/4 lymph nodes positive for macrometastasis. ER/PR positive, HER-2 negative.   12/26/2018 - 06/01/2019 Chemotherapy   dexamethasone (DECADRON) 4 MG tablet, 1 of 1 cycle, Start date: 12/05/2018, End date: 03/02/2019  DOXOrubicin (ADRIAMYCIN) chemo injection 114 mg, 60 mg/m2 = 114 mg, Intravenous,  Once, 4 of 4  cycles. Administration: 114 mg (01/05/2019), 114 mg (01/19/2019), 114 mg (02/02/2019), 114 mg (02/15/2019)  palonosetron (ALOXI) injection 0.25 mg, 0.25 mg, Intravenous,  Once, 1 of 1 cycle. Administration: 0.25 mg (01/05/2019)  pegfilgrastim (NEULASTA ONPRO KIT) injection 6 mg, 6 mg, Subcutaneous, Once, 2 of 2  cycles  pegfilgrastim-jmdb (FULPHILA) injection 6 mg, 6 mg, Subcutaneous,  Once, 4 of 4 cycles. Administration: 6 mg (01/07/2019), 6 mg (01/21/2019), 6 mg (02/06/2019), 6 mg (02/17/2019)  cyclophosphamide (CYTOXAN) 1,140 mg in sodium chloride 0.9 % 250 mL chemo infusion, 600 mg/m2 = 1,140 mg, Intravenous,  Once, 4 of 4 cycles. Administration: 1,140 mg (01/05/2019), 1,140 mg (01/19/2019), 1,140 mg (02/02/2019), 1,140 mg (02/15/2019).  PACLitaxel (TAXOL) 150 mg in sodium chloride 0.9 % 250 mL chemo infusion (</= $RemoveBefor'80mg'ylFVnvwOpgqQ$ /m2), 80 mg/m2 = 150 mg, Intravenous,  Once, 12 of 12 cycles. Administration: 150 mg (03/02/2019), 150 mg (03/09/2019), 150 mg (03/23/2019), 150 mg (03/30/2019), 150 mg (04/06/2019), 150 mg (04/20/2019), 150 mg (04/27/2019), 150 mg (05/03/2019), 150 mg (05/11/2019), 150 mg (05/18/2019), 150 mg (05/25/2019), 150 mg (06/01/2019)  fosaprepitant (EMEND) 150 mg  dexamethasone (DECADRON) 12 mg in sodium chloride 0.9 % 145 mL IVPB, , Intravenous,  Once, 4 of 4 cycles. Administration:  (01/05/2019),  (01/19/2019),  (02/02/2019),  (02/15/2019).   06/21/2019 - 08/11/2019 Radiation Therapy   The patient initially received a dose of 50.4 Gy in 28 fractions to the breast using whole-breast tangent fields. This was delivered using a 3-D conformal technique. The pt received a boost delivering an additional 16 Gy in 8 fractions using a electron boost with 59meV electrons. The total dose was 66.4 Gy.    11/12/2019 Cancer Staging   Staging form: Breast, AJCC 8th Edition - Pathologic: Stage IB (pT2, pN1a, cM0, G2, ER+, PR+, HER2-)     Oncotype testing   MammaPrint high risk     ALLERGIES:  is allergic to betadine [povidone-iodine].  MEDICATIONS:  Current Facility-Administered Medications  Medication Dose Route Frequency Provider Last Rate Last Admin   acetaminophen (TYLENOL) tablet 1,000 mg  1,000 mg Oral Q6H PRN Agbata, Tochukwu, MD   1,000 mg at 03/10/21 0829   ALPRAZolam Duanne Moron) tablet 0.25 mg  0.25 mg Oral BID PRN  Agbata, Tochukwu, MD   0.25 mg at 03/10/21 0504   azithromycin (ZITHROMAX) tablet 500 mg  500 mg Oral Daily Benita Gutter, RPH       Chlorhexidine Gluconate Cloth 2 % PADS 6 each  6 each Topical Daily Enzo Bi, MD       enoxaparin (LOVENOX) injection 40 mg  40 mg Subcutaneous Q24H Agbata, Tochukwu, MD   40 mg at 03/09/21 2212   feeding supplement (ENSURE ENLIVE / ENSURE PLUS) liquid 237 mL  237 mL Oral TID BM Enzo Bi, MD   237 mL at 03/10/21 1504   furosemide (LASIX) injection 40 mg  40 mg Intravenous Daily Ottie Glazier, MD   40 mg at 03/10/21 0910   guaiFENesin (ROBITUSSIN) 100 MG/5ML liquid 5 mL  5 mL Oral Q4H PRN Agbata, Tochukwu, MD       HYDROmorphone (DILAUDID) injection 0.5 mg  0.5 mg Intravenous Q4H PRN Sherina Stammer, Kirt Boys, NP       HYDROmorphone (DILAUDID) tablet 2 mg  2 mg Oral Q4H Enzo Bi, MD   2 mg at 03/10/21 1523   ipratropium-albuterol (DUONEB) 0.5-2.5 (3) MG/3ML nebulizer solution 3 mL  3 mL Nebulization Q4H PRN Agbata, Tochukwu, MD   3 mL at 03/10/21 0454   lactated ringers infusion  Intravenous Continuous Liana Gerold, MD 75 mL/hr at 03/10/21 1507 New Bag at 03/10/21 1507   metoprolol tartrate (LOPRESSOR) tablet 12.5 mg  12.5 mg Oral BID Ottie Glazier, MD   12.5 mg at 03/10/21 0909   morphine (MS CONTIN) 12 hr tablet 30 mg  30 mg Oral Q8H Enzo Bi, MD   30 mg at 03/10/21 1117   ondansetron (ZOFRAN) tablet 4 mg  4 mg Oral Q6H PRN Agbata, Tochukwu, MD       Or   ondansetron (ZOFRAN) injection 4 mg  4 mg Intravenous Q6H PRN Agbata, Tochukwu, MD   4 mg at 03/09/21 2320   piperacillin-tazobactam (ZOSYN) IVPB 3.375 g  3.375 g Intravenous Azzie Roup, MD 12.5 mL/hr at 03/10/21 1513 3.375 g at 03/10/21 1513   polyethylene glycol (MIRALAX / GLYCOLAX) packet 17 g  17 g Oral Daily Bentleigh Stankus, Kirt Boys, NP       prochlorperazine (COMPAZINE) injection 10 mg  10 mg Intravenous Q6H PRN Athena Masse, MD   10 mg at 03/10/21 0117   senna (SENOKOT) tablet 17.2 mg  2 tablet  Oral QHS Zollie Clemence, Kirt Boys, NP       sodium chloride flush (NS) 0.9 % injection 10-40 mL  10-40 mL Intracatheter Q12H Enzo Bi, MD       sodium chloride flush (NS) 0.9 % injection 10-40 mL  10-40 mL Intracatheter PRN Enzo Bi, MD       venlafaxine XR (EFFEXOR-XR) 24 hr capsule 150 mg  150 mg Oral Q breakfast Enzo Bi, MD   150 mg at 03/10/21 1117    VITAL SIGNS: BP (!) 160/97    Pulse 100    Temp 98.7 F (37.1 C)    Resp 16    Ht $R'5\' 3"'md$  (1.6 m)    Wt 169 lb 12.1 oz (77 kg)    LMP 01/06/2019 (Approximate)    SpO2 96%    BMI 30.07 kg/m  Filed Weights   03/09/21 0950  Weight: 169 lb 12.1 oz (77 kg)    Estimated body mass index is 30.07 kg/m as calculated from the following:   Height as of this encounter: $RemoveBeforeD'5\' 3"'mXaUWXuEZCdWxn$  (1.6 m).   Weight as of this encounter: 169 lb 12.1 oz (77 kg).  LABS: CBC:    Component Value Date/Time   WBC 17.9 (H) 03/10/2021 0815   HGB 11.6 (L) 03/10/2021 0815   HGB 13.1 06/01/2019 1340   HCT 34.1 (L) 03/10/2021 0815   PLT 299 03/10/2021 0815   PLT 322 06/01/2019 1340   MCV 98.3 03/10/2021 0815   NEUTROABS 13.0 (H) 03/09/2021 1026   LYMPHSABS 0.7 03/09/2021 1026   MONOABS 0.5 03/09/2021 1026   EOSABS 0.0 03/09/2021 1026   BASOSABS 0.0 03/09/2021 1026   Comprehensive Metabolic Panel:    Component Value Date/Time   NA 140 03/10/2021 0815   K 3.5 03/10/2021 0815   CL 104 03/10/2021 0815   CO2 21 (L) 03/10/2021 0815   BUN 16 03/10/2021 0815   CREATININE 0.70 03/10/2021 0815   CREATININE 0.59 06/01/2019 1340   GLUCOSE 88 03/10/2021 0815   CALCIUM QUANTITY NOT SUFFICIENT, UNABLE TO PERFORM TEST 03/10/2021 0815   AST 95 (H) 03/09/2021 1130   AST 21 06/01/2019 1340   ALT 26 03/09/2021 1130   ALT 26 06/01/2019 1340   ALKPHOS 78 03/09/2021 1130   BILITOT 0.5 03/09/2021 1130   BILITOT 0.5 06/01/2019 1340   PROT 7.2 03/09/2021 1130   ALBUMIN 3.5  03/09/2021 1130    RADIOGRAPHIC STUDIES: DG Chest 2 View  Result Date: 03/09/2021 CLINICAL DATA:  Shortness of  breath EXAM: CHEST - 2 VIEW COMPARISON:  Chest radiograph dated February 24, 2021 and CT examination dated February 23, 2021 FINDINGS: The heart is enlarged. Left basilar opacity which may represent atelectasis or infiltrate, unchanged. Multiple bilateral nodular opacities consistent with patient's known metastatic disease is unchanged. No appreciable pneumothorax. No acute osseous abnormality. IMPRESSION: 1.  Stable cardiomegaly. 2. Left basilar opacity which may represent atelectasis, infiltrate or small effusion, unchanged. 3. Numerous bilateral nodular pulmonary opacities consistent with metastatic disease. Electronically Signed   By: Keane Police D.O.   On: 03/09/2021 11:06   DG Chest 2 View  Result Date: 02/21/2021 CLINICAL DATA:  A 41 year old female presents with shortness of breath and history of breast cancer. EXAM: CHEST - 2 VIEW COMPARISON:  Chest x-ray from October 01, 2020 and CT of the chest from January 27, 2021. FINDINGS: Cardiomediastinal contours and hilar structures are stable. LEFT hemidiaphragm remains obscured. Signs of LEFT pleural effusion and basilar airspace process. Subtle background nodularity has increased in the RIGHT chest over time. No additional areas of frank consolidative changes. On limited assessment there is no acute skeletal process. Evidence of prior RIGHT axillary dissection presumably projecting over the RIGHT chest. IMPRESSION: 1. Malignant effusion in the LEFT chest with further consolidation or enlarging nodules and masses in this area. Difficult to exclude developing infection in the LEFT lung base. 2. Scattered small nodular densities in the RIGHT chest appearing more conspicuous than on previous imaging may represent a combination of known metastatic disease and superimposed infection. Electronically Signed   By: Zetta Bills M.D.   On: 02/21/2021 13:22   CT CHEST WO CONTRAST  Result Date: 03/09/2021 CLINICAL DATA:  Pneumonia, shortness of breath EXAM: CT  CHEST WITHOUT CONTRAST TECHNIQUE: Multidetector CT imaging of the chest was performed following the standard protocol without IV contrast. RADIATION DOSE REDUCTION: This exam was performed according to the departmental dose-optimization program which includes automated exposure control, adjustment of the mA and/or kV according to patient size and/or use of iterative reconstruction technique. COMPARISON:  CT chest angiogram, 02/23/2021, CT chest abdomen pelvis, 01/27/2021 FINDINGS: Cardiovascular: No significant vascular findings. Normal heart size. No pericardial effusion. Mediastinum/Nodes: No enlarged mediastinal, hilar, or axillary lymph nodes. Thyroid gland, trachea, and esophagus demonstrate no significant findings. Lungs/Pleura: Redemonstrated, widespread pulmonary and pleural nodularity, pleural disease more extensive on the left, which appears slightly increased compared to prior examination dated 02/23/2021. There is similar dense consolidation and atelectasis of the inferior left lower lobe and lingula with a small left pleural effusion. Upper Abdomen: No acute abnormality. Multiple hypodense hepatic metastases and multiple peritoneal nodules, not appreciably changed. Musculoskeletal: No chest wall abnormality. Unchanged lytic metastasis of the T10 vertebral body (series 6, image 113). IMPRESSION: 1. Redemonstrated, widespread bilateral pulmonary and pleural metastatic disease, which appears slightly worsened compared to prior examination dated 02/23/2021. 2. There is similar dense consolidation and atelectasis of the inferior left lower lobe and lingula with a small left pleural effusion. This is worrisome for infection or aspiration. 3. Additional hepatic, peritoneal, and osseous metastatic disease, not obviously changed as partially imaged. Electronically Signed   By: Delanna Ahmadi M.D.   On: 03/09/2021 13:17   CT Angio Chest PE W and/or Wo Contrast  Addendum Date: 02/24/2021   ADDENDUM REPORT:  02/24/2021 07:47 ADDENDUM: As stated in the original dictation, left-sided pleural metastasis, widespread pulmonary metastases and  hepatic metastases all appear increased compared to prior examinations. In addition, there is consolidative airspace disease in the left lower lobe, concerning for pneumonia. Electronically Signed   By: Vinnie Langton M.D.   On: 02/24/2021 07:47   Result Date: 02/24/2021 CLINICAL DATA:  Pulmonary embolism suspected. High probability. Metastatic breast cancer. Shortness of breath. EXAM: CT ANGIOGRAPHY CHEST WITH CONTRAST TECHNIQUE: Multidetector CT imaging of the chest was performed using the standard protocol during bolus administration of intravenous contrast. Multiplanar CT image reconstructions and MIPs were obtained to evaluate the vascular anatomy. RADIATION DOSE REDUCTION: This exam was performed according to the departmental dose-optimization program which includes automated exposure control, adjustment of the mA and/or kV according to patient size and/or use of iterative reconstruction technique. CONTRAST:  15mL OMNIPAQUE IOHEXOL 350 MG/ML SOLN COMPARISON:  Chest radiography same day.  Chest CT 01/27/2021. FINDINGS: Cardiovascular: Cardiomegaly with left ventricular prominence. No visible aortic atherosclerotic calcification. Pulmonary arterial opacification is good. There are no pulmonary emboli. Mediastinum/Nodes: Over the last month, there has been considerable growth and mediastinal tumor. Anterior mediastinal disease previously measured at 15 and 17 mm on axial image 32 now measures 17 and 20 mm. Increased tumor growth in the subcarinal region and left hilum and along the left pericardial margin. Lungs/Pleura: Marked worsening of pulmonary metastatic disease. Innumerable nodules scattered throughout the lungs measuring 9 mm in size and smaller. There is worsening collapse of the left lower lobe. Lobular pleural disease on the left has worsened. Upper Abdomen: Worsening  of hepatic metastatic disease. More numerous and larger metastatic lesions. For example, metastasis at the dome of the liver on the right previously measured 18 mm in diameter and now measures 21 mm in diameter. Musculoskeletal: Lytic sclerotic metastatic disease throughout the spine and ribs appears similar. Review of the MIP images confirms the above findings. IMPRESSION: No pulmonary emboli. Progression of metastatic tumor within the mediastinum, left hilum, manifest as nodular metastatic lesions throughout both lungs, and within the liver. Worsened volume loss in the left lower lobe with progression of pleural disease. This interpretation is rendered on an emergent basis in order to rule out pulmonary emboli. The exam will be reviewed and addended by body/oncologic specialist radiologist tomorrow. Electronically Signed: By: Nelson Chimes M.D. On: 02/23/2021 21:42   CARDIAC CATHETERIZATION  Result Date: 02/25/2021   The left ventricular systolic function is normal.   LV end diastolic pressure is normal.   The left ventricular ejection fraction is 55-65% by visual estimate. Conclusion Normal left ventricular function EF of 60% Normal coronaries Intervention deferred No conclusion   DG Chest Port 1 View  Result Date: 02/24/2021 CLINICAL DATA:  Shortness of breath, chest pain EXAM: PORTABLE CHEST 1 VIEW COMPARISON:  Previous studies including the examination of 02/23/2021 FINDINGS: Transverse diameter of heart is increased. There is opacification of left lower lung fields consistent with pleural effusion and possibly underlying infiltrate with no significant change. Are possible small nodular densities in the right mid and right lower lung fields. Right lateral CP angle is clear. There is no pneumothorax. IMPRESSION: No significant interval changes are noted in the opacification of left lower lung fields suggesting pleural effusion and possibly underlying infiltrate. Electronically Signed   By: Elmer Picker M.D.   On: 02/24/2021 15:06   DG Chest Portable 1 View  Result Date: 02/23/2021 CLINICAL DATA:  Chest pain and shortness of breath. History of metastatic breast cancer. EXAM: PORTABLE CHEST 1 VIEW COMPARISON:  02/21/2021 and prior radiograph FINDINGS: The  cardiomediastinal silhouette is unchanged. LEFT LOWER lung consolidation/atelectasis and LEFT pleural thickening again noted. No new pulmonary findings are noted. There is no evidence of pneumothorax or acute bony abnormality. IMPRESSION: Unchanged appearance of the chest with LEFT LOWER lung consolidation/atelectasis and LEFT pleural thickening. Electronically Signed   By: Margarette Canada M.D.   On: 02/23/2021 20:42   [place] EKG SITE RITE  Result Date: 03/10/2021 If Site Rite image not attached, placement could not be confirmed due to current cardiac rhythm.   PERFORMANCE STATUS (ECOG) : 1 - Symptomatic but completely ambulatory  Review of Systems Unless otherwise noted, a complete review of systems is negative.  Physical Exam General: NAD Pulmonary: Unlabored Abdomen: soft, nontender, + bowel sounds GU: no suprapubic tenderness Extremities: no edema, no joint deformities Skin: no rashes Neurological: Weakness but otherwise nonfocal  IMPRESSION: Patient is known to me from the clinic.  She was admitted to the hospital with hypercalcemia.  She is on IV fluids and received dose of Zometa.  CT of the chest concerning for possible further disease progression.  Patient endorses poorly controlled pain, mostly in back/upper abdomen.  She is on MS Contin 30 mg every 8 hours.  Hospitalist rotated from oxycodone to scheduled oral hydromorphone earlier today.  Patient has found the most relief from IV hydromorphone and requested that it be added back in case she needs it to facilitate pain management overnight.  I previously discussed with patient the option of referral to interventional pain management.  Patient now in agreement and I will  coordinate this in the outpatient setting.  Last bowel movement 3 days ago.  Will start bowel regimen daily.  If bowel regimen ineffective, would consider peripheral opioid receptor antagonist.  PLAN: -Continue current scope of treatment -Continue MS Contin/oral hydromorphone -We will add IV hydromorphone as backup if oral regimen ineffective -Daily bowel regimen -We will plan referral to interventional pain management to see in the outpatient setting  Case and plan discussed with Dr. Grayland Ormond   Patient expressed understanding and was in agreement with this plan. She also understands that She can call the clinic at any time with any questions, concerns, or complaints.     Time Total: 30 minutes  Visit consisted of counseling and education dealing with the complex and emotionally intense issues of symptom management and palliative care in the setting of serious and potentially life-threatening illness.Greater than 50%  of this time was spent counseling and coordinating care related to the above assessment and plan.  Signed by: Altha Harm, PhD, NP-C

## 2021-03-10 NOTE — Plan of Care (Signed)
  Problem: Health Behavior/Discharge Planning: Goal: Ability to manage health-related needs will improve Outcome: Progressing   Problem: Clinical Measurements: Goal: Will remain free from infection Outcome: Progressing   

## 2021-03-11 ENCOUNTER — Other Ambulatory Visit: Payer: Self-pay | Admitting: Hospice and Palliative Medicine

## 2021-03-11 DIAGNOSIS — J189 Pneumonia, unspecified organism: Principal | ICD-10-CM

## 2021-03-11 DIAGNOSIS — Z170 Estrogen receptor positive status [ER+]: Secondary | ICD-10-CM

## 2021-03-11 DIAGNOSIS — C50411 Malignant neoplasm of upper-outer quadrant of right female breast: Secondary | ICD-10-CM

## 2021-03-11 LAB — BASIC METABOLIC PANEL
Anion gap: 13 (ref 5–15)
BUN: 14 mg/dL (ref 6–20)
CO2: 27 mmol/L (ref 22–32)
Calcium: 9.6 mg/dL (ref 8.9–10.3)
Chloride: 97 mmol/L — ABNORMAL LOW (ref 98–111)
Creatinine, Ser: 0.66 mg/dL (ref 0.44–1.00)
GFR, Estimated: >60 mL/min (ref >60)
Glucose, Bld: 88 mg/dL (ref 70–99)
Potassium: 2.8 mmol/L — ABNORMAL LOW (ref 3.5–5.1)
Sodium: 137 mmol/L (ref 135–145)

## 2021-03-11 LAB — CBC
HCT: 33.3 % — ABNORMAL LOW (ref 36.0–46.0)
Hemoglobin: 11.5 g/dL — ABNORMAL LOW (ref 12.0–15.0)
MCH: 33.4 pg (ref 26.0–34.0)
MCHC: 34.5 g/dL (ref 30.0–36.0)
MCV: 96.8 fL (ref 80.0–100.0)
Platelets: 283 10*3/uL (ref 150–400)
RBC: 3.44 MIL/uL — ABNORMAL LOW (ref 3.87–5.11)
RDW: 14.1 % (ref 11.5–15.5)
WBC: 19 10*3/uL — ABNORMAL HIGH (ref 4.0–10.5)
nRBC: 0 % (ref 0.0–0.2)

## 2021-03-11 LAB — PTH, INTACT AND CALCIUM
Calcium, Total (PTH): 10.7 mg/dL — ABNORMAL HIGH (ref 8.7–10.2)
PTH: 6 pg/mL — ABNORMAL LOW (ref 15–65)

## 2021-03-11 LAB — MAGNESIUM: Magnesium: 1.9 mg/dL (ref 1.7–2.4)

## 2021-03-11 LAB — HISTOPLASMA GAL'MANNAN AG SER: Histoplasma Gal'mannan Ag Ser: <0.5 (ref <0.5)

## 2021-03-11 LAB — ACID FAST SMEAR (AFB, MYCOBACTERIA)

## 2021-03-11 LAB — PROCALCITONIN: Procalcitonin: <0.1 ng/mL

## 2021-03-11 LAB — LEGIONELLA PNEUMOPHILA TOTAL AB: Legionella Pneumo Total Ab: 1.76 OD ratio — ABNORMAL HIGH (ref 0.00–0.90)

## 2021-03-11 LAB — ANGIOTENSIN CONVERTING ENZYME: Angiotensin-Converting Enzyme: 55 U/L (ref 14–82)

## 2021-03-11 MED ORDER — SENNA 8.6 MG PO TABS
2.0000 | ORAL_TABLET | Freq: Two times a day (BID) | ORAL | Status: DC
  Administered 2021-03-11 – 2021-03-13 (×5): 17.2 mg via ORAL
  Filled 2021-03-11 (×5): qty 2

## 2021-03-11 MED ORDER — POTASSIUM CHLORIDE 20 MEQ PO PACK
40.0000 meq | PACK | ORAL | Status: AC
  Administered 2021-03-11 (×2): 40 meq via ORAL
  Filled 2021-03-11 (×2): qty 2

## 2021-03-11 MED ORDER — HYDRALAZINE HCL 20 MG/ML IJ SOLN
10.0000 mg | Freq: Once | INTRAMUSCULAR | Status: AC
Start: 2021-03-11 — End: 2021-03-11
  Administered 2021-03-11: 10 mg via INTRAVENOUS
  Filled 2021-03-11: qty 1

## 2021-03-11 MED ORDER — AMPHETAMINE-DEXTROAMPHET ER 5 MG PO CP24
30.0000 mg | ORAL_CAPSULE | Freq: Every morning | ORAL | Status: DC
  Administered 2021-03-13: 10:00:00 30 mg via ORAL
  Filled 2021-03-11: qty 6

## 2021-03-11 MED ORDER — HYDROMORPHONE HCL 2 MG PO TABS
2.0000 mg | ORAL_TABLET | ORAL | Status: DC | PRN
  Administered 2021-03-11: 2 mg via ORAL
  Administered 2021-03-11: 4 mg via ORAL
  Filled 2021-03-11 (×2): qty 1
  Filled 2021-03-11 (×2): qty 2

## 2021-03-11 MED ORDER — HYDRALAZINE HCL 20 MG/ML IJ SOLN: 5.0000 mg | Freq: Once | INTRAMUSCULAR | Status: DC

## 2021-03-11 MED ORDER — MORPHINE SULFATE ER 15 MG PO TBCR
15.0000 mg | EXTENDED_RELEASE_TABLET | Freq: Three times a day (TID) | ORAL | Status: DC
  Administered 2021-03-11 – 2021-03-13 (×7): 15 mg via ORAL
  Filled 2021-03-11 (×7): qty 1

## 2021-03-11 MED ORDER — MORPHINE SULFATE ER 15 MG PO TBCR
30.0000 mg | EXTENDED_RELEASE_TABLET | Freq: Three times a day (TID) | ORAL | Status: DC
  Administered 2021-03-11 – 2021-03-13 (×7): 30 mg via ORAL
  Filled 2021-03-11 (×7): qty 2

## 2021-03-11 MED ORDER — POTASSIUM CHLORIDE CRYS ER 20 MEQ PO TBCR
40.0000 meq | EXTENDED_RELEASE_TABLET | ORAL | Status: DC
  Administered 2021-03-11: 40 meq via ORAL
  Filled 2021-03-11 (×2): qty 2

## 2021-03-11 MED ORDER — KETOROLAC TROMETHAMINE 15 MG/ML IJ SOLN
15.0000 mg | Freq: Three times a day (TID) | INTRAMUSCULAR | Status: DC | PRN
  Administered 2021-03-12 – 2021-03-13 (×3): 15 mg via INTRAVENOUS
  Filled 2021-03-11 (×3): qty 1

## 2021-03-11 MED ORDER — POLYETHYLENE GLYCOL 3350 17 G PO PACK
17.0000 g | PACK | Freq: Two times a day (BID) | ORAL | Status: DC
  Administered 2021-03-11 – 2021-03-12 (×4): 17 g via ORAL
  Filled 2021-03-11 (×4): qty 1

## 2021-03-11 NOTE — Progress Notes (Signed)
PULMONOLOGY         Date: 03/11/2021,   MRN# [id number] Kaylee Romero 06-18-1980     AdmissionWeight: 77 kg                 CurrentWeight: 77 kg   Referring physician: Dr Francine Graven   CHIEF COMPLAINT:   Recurrent malignant pleural effusion   HISTORY OF PRESENT ILLNESS   This is a 41 year old unfortunate patient with history of breast cancer and multiple additional comorbid conditions, evaluated by me last year for dyspnea found to have pleural effusion.  Initial thoracentesis done June 2022 was negative for atypia, patient subsequently had surgical biopsy of area of PET avid abnormal pleural thickening showing PR and ER positive non-small cell CA. recently patient did develop left lower lobe opacifications which was deemed to be pneumonia status posttreatment with levofloxacin and subsequent transient improvement followed by worsening and readmission showing reaccumulation of left pleural effusion.  On arrival symptoms included cough and dyspnea patient was placed on empiric antimicrobials for community-acquired pneumonia with cefepime and vancomycin.  CT chest was performed which I independently reviewed showing innumerable metastatic implants bilaterally in the lungs as well as previously noted abnormal thickening of the pleura, left lower lobe consolidation with severe narrowing of the left lower lobe bronchioles.  Blood work including CMP shows significant hypercalcemia at 13.1. Pulmonary consultation placed for additional evaluation management.    03/10/21- patient is stable without overnight events. Analgesia regiment modified. Reviewed antibiotics with change to zosyn. S/p nephrology, med/onc, palliative and hospitalist evaluaton appeciate everyone involved in patients care.   03/11/21- patient with no acute events overnight. She is resting with mild pain at this point. She is being seen by nephrology and oncology.   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   Malignant neoplasm of upper-outer quadrant of right breast in female, estrogen receptor positive [facility])    oncologist--- dr Grayland Ormond;;  first dx 09/ 2020 s/p right lumpectomy w/ node dissection and completed chemoradiation 08/ 2021;;  recurrent 07/ 2022 to lung pleura,liver, and thoracic spine   SOB (shortness of breath)    02-12-2021  pt denies with regular activity but sob with stairs     SURGICAL HISTORY   Past Surgical History:  Procedure Laterality Date   ABDOMINAL HYSTERECTOMY N/A 02/17/2021   BREAST LUMPECTOMY WITH AXILLARY LYMPH NODE BIOPSY Right 11/21/2018   Procedure: RIGHT BREAST LUMPECTOMY WITH SENTINEL LYMPH NODE BIOPSY;  Surgeon: Jovita Kussmaul, MD;  Location: [place];  Service: General;  Laterality: Right;   BREAST REDUCTION SURGERY Bilateral 11/21/2018   Procedure: BILATERAL MAMMARY REDUCTION  (BREAST);  Surgeon: Wallace Going, DO;  Location: [place];  Service: Plastics;  Laterality: Bilateral;   CHEST TUBE INSERTION Left 07/29/2020   Procedure: INSERTION PLEURAL DRAINAGE CATHETER;  Surgeon: Lajuana Matte, MD;  Location: [place];  Service: Thoracic;  Laterality: Left;   IRRIGATION AND DEBRIDEMENT STERNOCLAVICULAR JOINT-STERNUM AND RIBS N/A 08/13/2020   Procedure: IRRIGATION AND DEBRIDEMENT CHEST WALL ABSCESS;  Surgeon: Lajuana Matte,  MD;  Location: [place];  Service: Cardiothoracic;  Laterality: N/A;   IUD REMOVAL N/A 02/17/2021   Procedure: INTRAUTERINE DEVICE (IUD) REMOVAL;  Surgeon: Linda Hedges, DO;  Location: [place];  Service: Gynecology;  Laterality: N/A;   LAPAROSCOPIC  SALPINGO OOPHERECTOMY Bilateral 02/17/2021   Procedure: LAPAROSCOPIC BILATERAL SALPINGO OOPHORECTOMY;  Surgeon: Linda Hedges, DO;  Location: [place];  Service: Gynecology;  Laterality: Bilateral;   LEFT HEART CATH AND CORONARY ANGIOGRAPHY N/A 02/25/2021   Procedure: LEFT HEART CATH AND CORONARY ANGIOGRAPHY possible PCI and stent;  Surgeon: Yolonda Kida, MD;  Location: [place] [facility];  Service: Cardiovascular;  Laterality: N/A;   PLEURAL BIOPSY Left 07/29/2020   Procedure: PLEURAL BIOPSY;  Surgeon: Lajuana Matte, MD;  Location: [place];  Service: Thoracic;  Laterality: Left;   PLEURAL EFFUSION DRAINAGE Left 07/29/2020   Procedure: DRAINAGE OF PLEURAL EFFUSION;  Surgeon: Lajuana Matte, MD;  Location: [place];  Service: Thoracic;  Laterality: Left;   PORT-A-CATH REMOVAL N/A 07/06/2019   Procedure: REMOVAL PORT-A-CATH;  Surgeon: Jovita Kussmaul, MD;  Location: [place];  Service: General;  Laterality: N/A;   PORTACATH PLACEMENT N/A 11/21/2018   Procedure: INSERTION LEFT PORT-A-CATH WITH ULTRASOUND GUIDANCE;  Surgeon: Jovita Kussmaul, MD;  Location: [place];  Service: General;  Laterality: N/A;   TONSILLECTOMY  1987   VIDEO ASSISTED THORACOSCOPY Left 07/29/2020   Procedure: VIDEO ASSISTED THORACOSCOPY;  Surgeon: Lajuana Matte, MD;  Location: [place];  Service: Thoracic;  Laterality: Left;   WISDOM TOOTH EXTRACTION       FAMILY HISTORY   Family History  Problem Relation Age of Onset   Hypertension Father    Alcohol abuse Paternal Grandfather    Heart disease Paternal Grandfather    Alcohol abuse Maternal Grandfather    Heart disease Maternal Grandmother    Breast cancer Other      SOCIAL HISTORY   Social History   Tobacco Use   Smoking status: Former    Packs/day: 0.50    Years: 20.00    Pack years: 10.00    Types: Cigarettes    Start date: 01/13/2002    Quit date: 10/17/2016    Years since quitting: 4.4   Smokeless  tobacco: Never  Vaping Use   Vaping Use: Never used  Substance Use Topics   Alcohol use: Yes    Comment: Socially   Drug use: Never     MEDICATIONS    Home Medication:     Current Medication:  Current Facility-Administered Medications:    0.9 %  sodium chloride infusion, , Intravenous, Continuous, Breeze, Shantelle, NP, Last Rate: 75 mL/hr at 03/11/21 1244, Rate Change at 03/11/21 1244   acetaminophen (TYLENOL) tablet 1,000 mg, 1,000 mg, Oral, Q6H PRN, Agbata, Tochukwu, MD, 1,000 mg at 03/10/21 0829   ALPRAZolam (XANAX) tablet 0.25 mg, 0.25 mg, Oral, BID PRN, Agbata, Tochukwu, MD, 0.25 mg at 03/10/21 0504   azithromycin (ZITHROMAX) tablet 500 mg, 500 mg, Oral, Daily, Benita Gutter, RPH, 500 mg at 03/11/21 0913   Chlorhexidine Gluconate Cloth 2 % PADS 6 each, 6 each, Topical, Daily, Enzo Bi, MD, 6 each at 03/11/21 0919   enoxaparin (LOVENOX) injection 40 mg, 40 mg, Subcutaneous, Q24H, Agbata, Tochukwu, MD, 40 mg at 03/10/21 2148   feeding supplement (ENSURE ENLIVE / ENSURE PLUS) liquid 237 mL, 237 mL, Oral, TID BM, Enzo Bi, MD, 237 mL at 03/11/21 1503   furosemide (LASIX) injection 40 mg,  40 mg, Intravenous, Daily, Ottie Glazier, MD, 40 mg at 03/11/21 0913   guaiFENesin (ROBITUSSIN) 100 MG/5ML liquid 5 mL, 5 mL, Oral, Q4H PRN, Agbata, Tochukwu, MD   HYDROmorphone (DILAUDID) injection 0.5 mg, 0.5 mg, Intravenous, Q4H PRN, Borders, Kirt Boys, NP, 0.5 mg at 03/11/21 1536   HYDROmorphone (DILAUDID) tablet 2-4 mg, 2-4 mg, Oral, Q4H PRN, Borders, Kirt Boys, NP, 2 mg at 03/11/21 1244   ipratropium-albuterol (DUONEB) 0.5-2.5 (3) MG/3ML nebulizer solution 3 mL, 3 mL, Nebulization, Q4H PRN, Agbata, Tochukwu, MD, 3 mL at 03/11/21 1247   ketorolac (TORADOL) 15 MG/ML injection 15-30 mg, 15-30 mg, Intravenous, Q8H PRN, Borders, Kirt Boys, NP   metoprolol tartrate (LOPRESSOR) tablet 12.5 mg, 12.5 mg, Oral, BID, Lanney Gins, Tifanny Dollens, MD, 12.5 mg at 03/11/21 0914   morphine (MS CONTIN) 12 hr tablet  15 mg, 15 mg, Oral, Q8H, Borders, Joshua R, NP, 15 mg at 03/11/21 1503   morphine (MS CONTIN) 12 hr tablet 30 mg, 30 mg, Oral, Q8H, Borders, Joshua R, NP, 30 mg at 03/11/21 0913   ondansetron (ZOFRAN) tablet 4 mg, 4 mg, Oral, Q6H PRN **OR** ondansetron (ZOFRAN) injection 4 mg, 4 mg, Intravenous, Q6H PRN, Agbata, Tochukwu, MD, 4 mg at 03/09/21 2320   piperacillin-tazobactam (ZOSYN) IVPB 3.375 g, 3.375 g, Intravenous, Q8H, Enzo Bi, MD, Last Rate: 12.5 mL/hr at 03/11/21 1501, 3.375 g at 03/11/21 1501   polyethylene glycol (MIRALAX / GLYCOLAX) packet 17 g, 17 g, Oral, BID, Borders, Kirt Boys, NP, 17 g at 03/11/21 0913   potassium chloride (KLOR-CON) packet 40 mEq, 40 mEq, Oral, Q4H, Rauer, Samantha O, RPH, 40 mEq at 03/11/21 1536   prochlorperazine (COMPAZINE) injection 10 mg, 10 mg, Intravenous, Q6H PRN, Athena Masse, MD, 10 mg at 03/10/21 1717   senna (SENOKOT) tablet 17.2 mg, 2 tablet, Oral, BID, Borders, Kirt Boys, NP, 17.2 mg at 03/11/21 0913   sodium chloride flush (NS) 0.9 % injection 10-40 mL, 10-40 mL, Intracatheter, Q12H, Enzo Bi, MD, 10 mL at 03/11/21 0915   sodium chloride flush (NS) 0.9 % injection 10-40 mL, 10-40 mL, Intracatheter, PRN, Enzo Bi, MD   venlafaxine XR (EFFEXOR-XR) 24 hr capsule 150 mg, 150 mg, Oral, Q breakfast, Enzo Bi, MD, 150 mg at 03/11/21 5456    ALLERGIES   Betadine [povidone-iodine]     REVIEW OF SYSTEMS    Review of Systems:  Gen:  Denies  fever, sweats, chills weigh loss  HEENT: Denies blurred vision, double vision, ear pain, eye pain, hearing loss, nose bleeds, sore throat Cardiac:  No dizziness, chest pain or heaviness, chest tightness,edema Resp:   Denies cough or sputum porduction, shortness of breath,wheezing, hemoptysis,  Gi: Denies swallowing difficulty, stomach pain, nausea or vomiting, diarrhea, constipation, bowel incontinence Gu:  Denies bladder incontinence, burning urine Ext:   Denies Joint pain, stiffness or swelling Skin:  Denies  skin rash, easy bruising or bleeding or hives Endoc:  Denies polyuria, polydipsia , polyphagia or weight change Psych:   Denies depression, insomnia or hallucinations   Other:  All other systems negative   VS: BP (!) 165/72 (BP Location: Left Leg)    Pulse (!) 103    Temp 98.5 F (36.9 C) (Oral)    Resp 18    Ht $R'5\' 3"'ku$  (1.6 m)    Wt 77 kg    LMP 01/06/2019 (Approximate)    SpO2 97%    BMI 30.07 kg/m      PHYSICAL EXAM    GENERAL:NAD, no fevers, chills, no  weakness no fatigue HEAD: Normocephalic, atraumatic.  EYES: Pupils equal, round, reactive to light. Extraocular muscles intact. No scleral icterus.  MOUTH: Moist mucosal membrane. Dentition intact. No abscess noted.  EAR, NOSE, THROAT: Clear without exudates. No external lesions.  NECK: Supple. No thyromegaly. No nodules. No JVD.  PULMONARY: Clear to auscultation with decreased air entry bilaterlly CARDIOVASCULAR: S1 and S2. Regular rate and rhythm. No murmurs, rubs, or gallops. No edema. Pedal pulses 2+ bilaterally.  GASTROINTESTINAL: Soft, nontender, nondistended. No masses. Positive bowel sounds. No hepatosplenomegaly.  MUSCULOSKELETAL: No swelling, clubbing, or edema. Range of motion full in all extremities.  NEUROLOGIC: Cranial nerves II through XII are intact. No gross focal neurological deficits. Sensation intact. Reflexes intact.  SKIN: No ulceration, lesions, rashes, or cyanosis. Skin warm and dry. Turgor intact.  PSYCHIATRIC: Mood, affect within normal limits. The patient is awake, alert and oriented x 3. Insight, judgment intact.       IMAGING    DG Chest 2 View  Result Date: 03/09/2021 CLINICAL DATA:  Shortness of breath EXAM: CHEST - 2 VIEW COMPARISON:  Chest radiograph dated February 24, 2021 and CT examination dated February 23, 2021 FINDINGS: The heart is enlarged. Left basilar opacity which may represent atelectasis or infiltrate, unchanged. Multiple bilateral nodular opacities consistent with patient's  known metastatic disease is unchanged. No appreciable pneumothorax. No acute osseous abnormality. IMPRESSION: 1.  Stable cardiomegaly. 2. Left basilar opacity which may represent atelectasis, infiltrate or small effusion, unchanged. 3. Numerous bilateral nodular pulmonary opacities consistent with metastatic disease. Electronically Signed   By: Keane Police D.O.   On: 03/09/2021 11:06   DG Chest 2 View  Result Date: 02/21/2021 CLINICAL DATA:  A 41 year old female presents with shortness of breath and history of breast cancer. EXAM: CHEST - 2 VIEW COMPARISON:  Chest x-ray from October 01, 2020 and CT of the chest from January 27, 2021. FINDINGS: Cardiomediastinal contours and hilar structures are stable. LEFT hemidiaphragm remains obscured. Signs of LEFT pleural effusion and basilar airspace process. Subtle background nodularity has increased in the RIGHT chest over time. No additional areas of frank consolidative changes. On limited assessment there is no acute skeletal process. Evidence of prior RIGHT axillary dissection presumably projecting over the RIGHT chest. IMPRESSION: 1. Malignant effusion in the LEFT chest with further consolidation or enlarging nodules and masses in this area. Difficult to exclude developing infection in the LEFT lung base. 2. Scattered small nodular densities in the RIGHT chest appearing more conspicuous than on previous imaging may represent a combination of known metastatic disease and superimposed infection. Electronically Signed   By: Zetta Bills M.D.   On: 02/21/2021 13:22   CT CHEST WO CONTRAST  Result Date: 03/09/2021 CLINICAL DATA:  Pneumonia, shortness of breath EXAM: CT CHEST WITHOUT CONTRAST TECHNIQUE: Multidetector CT imaging of the chest was performed following the standard protocol without IV contrast. RADIATION DOSE REDUCTION: This exam was performed according to the departmental dose-optimization program which includes automated exposure control, adjustment of  the mA and/or kV according to patient size and/or use of iterative reconstruction technique. COMPARISON:  CT chest angiogram, 02/23/2021, CT chest abdomen pelvis, 01/27/2021 FINDINGS: Cardiovascular: No significant vascular findings. Normal heart size. No pericardial effusion. Mediastinum/Nodes: No enlarged mediastinal, hilar, or axillary lymph nodes. Thyroid gland, trachea, and esophagus demonstrate no significant findings. Lungs/Pleura: Redemonstrated, widespread pulmonary and pleural nodularity, pleural disease more extensive on the left, which appears slightly increased compared to prior examination dated 02/23/2021. There is similar dense consolidation and atelectasis of  the inferior left lower lobe and lingula with a small left pleural effusion. Upper Abdomen: No acute abnormality. Multiple hypodense hepatic metastases and multiple peritoneal nodules, not appreciably changed. Musculoskeletal: No chest wall abnormality. Unchanged lytic metastasis of the T10 vertebral body (series 6, image 113). IMPRESSION: 1. Redemonstrated, widespread bilateral pulmonary and pleural metastatic disease, which appears slightly worsened compared to prior examination dated 02/23/2021. 2. There is similar dense consolidation and atelectasis of the inferior left lower lobe and lingula with a small left pleural effusion. This is worrisome for infection or aspiration. 3. Additional hepatic, peritoneal, and osseous metastatic disease, not obviously changed as partially imaged. Electronically Signed   By: Jearld Lesch M.D.   On: 03/09/2021 13:17   CT Angio Chest PE W and/or Wo Contrast  Addendum Date: 02/24/2021   ADDENDUM REPORT: 02/24/2021 07:47 ADDENDUM: As stated in the original dictation, left-sided pleural metastasis, widespread pulmonary metastases and hepatic metastases all appear increased compared to prior examinations. In addition, there is consolidative airspace disease in the left lower lobe, concerning for pneumonia.  Electronically Signed   By: Trudie Reed M.D.   On: 02/24/2021 07:47   Result Date: 02/24/2021 CLINICAL DATA:  Pulmonary embolism suspected. High probability. Metastatic breast cancer. Shortness of breath. EXAM: CT ANGIOGRAPHY CHEST WITH CONTRAST TECHNIQUE: Multidetector CT imaging of the chest was performed using the standard protocol during bolus administration of intravenous contrast. Multiplanar CT image reconstructions and MIPs were obtained to evaluate the vascular anatomy. RADIATION DOSE REDUCTION: This exam was performed according to the departmental dose-optimization program which includes automated exposure control, adjustment of the mA and/or kV according to patient size and/or use of iterative reconstruction technique. CONTRAST:  OMNIPAQUE IOHEXOL 350 MG/ML SOLN COMPARISON:  Chest radiography same day.  Chest CT 01/27/2021. FINDINGS: Cardiovascular: Cardiomegaly with left ventricular prominence. No visible aortic atherosclerotic calcification. Pulmonary arterial opacification is good. There are no pulmonary emboli. Mediastinum/Nodes: Over the last month, there has been considerable growth and mediastinal tumor. Anterior mediastinal disease previously measured at 15 and 17 mm on axial image 32 now measures 17 and 20 mm. Increased tumor growth in the subcarinal region and left hilum and along the left pericardial margin. Lungs/Pleura: Marked worsening of pulmonary metastatic disease. Innumerable nodules scattered throughout the lungs measuring 9 mm in size and smaller. There is worsening collapse of the left lower lobe. Lobular pleural disease on the left has worsened. Upper Abdomen: Worsening of hepatic metastatic disease. More numerous and larger metastatic lesions. For example, metastasis at the dome of the liver on the right previously measured 18 mm in diameter and now measures 21 mm in diameter. Musculoskeletal: Lytic sclerotic metastatic disease throughout the spine and ribs appears  similar. Review of the MIP images confirms the above findings. IMPRESSION: No pulmonary emboli. Progression of metastatic tumor within the mediastinum, left hilum, manifest as nodular metastatic lesions throughout both lungs, and within the liver. Worsened volume loss in the left lower lobe with progression of pleural disease. This interpretation is rendered on an emergent basis in order to rule out pulmonary emboli. The exam will be reviewed and addended by body/oncologic specialist radiologist tomorrow. Electronically Signed: By: Paulina Fusi M.D. On: 02/23/2021 21:42   CARDIAC CATHETERIZATION  Result Date: 02/25/2021   The left ventricular systolic function is normal.   LV end diastolic pressure is normal.   The left ventricular ejection fraction is 55-65% by visual estimate. Conclusion Normal left ventricular function EF of 60% Normal coronaries Intervention deferred No conclusion  DG Chest Port 1 View  Result Date: 02/24/2021 CLINICAL DATA:  Shortness of breath, chest pain EXAM: PORTABLE CHEST 1 VIEW COMPARISON:  Previous studies including the examination of 02/23/2021 FINDINGS: Transverse diameter of heart is increased. There is opacification of left lower lung fields consistent with pleural effusion and possibly underlying infiltrate with no significant change. Are possible small nodular densities in the right mid and right lower lung fields. Right lateral CP angle is clear. There is no pneumothorax. IMPRESSION: No significant interval changes are noted in the opacification of left lower lung fields suggesting pleural effusion and possibly underlying infiltrate. Electronically Signed   By: Elmer Picker M.D.   On: 02/24/2021 15:06   DG Chest Portable 1 View  Result Date: 02/23/2021 CLINICAL DATA:  Chest pain and shortness of breath. History of metastatic breast cancer. EXAM: PORTABLE CHEST 1 VIEW COMPARISON:  02/21/2021 and prior radiograph FINDINGS: The cardiomediastinal silhouette is  unchanged. LEFT LOWER lung consolidation/atelectasis and LEFT pleural thickening again noted. No new pulmonary findings are noted. There is no evidence of pneumothorax or acute bony abnormality. IMPRESSION: Unchanged appearance of the chest with LEFT LOWER lung consolidation/atelectasis and LEFT pleural thickening. Electronically Signed   By: Margarette Canada M.D.   On: 02/23/2021 20:42   [place] EKG SITE RITE  Result Date: 03/10/2021 If Site Rite image not attached, placement could not be confirmed due to current cardiac rhythm.   SURGICAL PATHOLOGY     Reason for Addendum #1:  Breast Biomarker Results  Reason for Addendum #2:  Breast Biomarker Results   Clinical History: pleural effusion (cm)      FINAL MICROSCOPIC DIAGNOSIS:   A. PLEURA PEEL, LEFT:  - Metastatic adenocarcinoma.   COMMENT:   Immunohistochemistry is positive for cytokeratin 7, GATA-3, and ER. The  cells are negative for cytokeratin 20, CD56, synaptophysin,  chromogranin, TTF-1, NapsinA, p40 and cytokeratin 5/6. The findings are  consistent with metastatic mammary carcinoma. Prognostic markers will be  ordered.   INTRAOPERATIVE DIAGNOSIS:   A1. PLEURA PEEL, LEFT, FROZEN SECTION:         Non-small cell carcinoma.         Rapid intraoperative consult diagnosis rendered by Dr. Melina Copa @  0920 07/29/2020.    GROSS DESCRIPTION:   Received fresh for rapid intraoperative consult evaluation by frozen  section are multiple irregular pieces of pink-white to pale yellow soft  to indurated and vaguely nodular tissues, 2.9 x 2.4 x 0.5 cm in  aggregate.  Representative sections are submitted in block 1 for frozen  section, with remaining specimen submitted blocks 2, 3 for routine  histology.   SW 07/29/2020    Final Diagnosis performed by Vicente Males, MD.   Electronically signed  07/31/2020  Technical component performed at [facility]. [facility], [place] [address], [place], [place] [address].   Professional component  performed at [facility],  [place] [address] [place], [place] [address].   Immunohistochemistry Technical component (if applicable) was performed  at [facility]. [address], [place],  [place], [place] [address].   IMMUNOHISTOCHEMISTRY DISCLAIMER (if applicable):  Some of these immunohistochemical stains may have been developed and the  performance characteristics determine by [facility]. Some  may not have been cleared or approved by the U.S. Food and Drug  Administration. The FDA has determined that such clearance or approval  is not necessary. This test is used for clinical purposes. It should not  be regarded as  investigational or for research. This laboratory is  certified under the [place]  (CLIA-88) as qualified to perform high complexity clinical laboratory  testing.  The controls stained appropriately.   ADDENDUM:   PROGNOSTIC INDICATOR RESULTS:   Immunohistochemical and morphometric analysis performed manually   The tumor cells are EQUIVOCAL for Her2 (2+) with a heterogeneous  pattern. The most intense areas have been selected for Her2 FISH and  results will be reported in an addendum.   Estrogen Receptor:       POSITIVE, 80%, MODERATE TO STRONG STAINING  Progesterone Receptor:   POSITIVE, 25%, STRONG STAINING   Reference Range Estrogen and Progesterone Receptor       Negative  0%       Positive  >1%   All controls stained appropriately.         ASSESSMENT/PLAN     Acute on chronic hypoxemic respiratory failure - present on admission  - COVID19 negative - supplemental O2 during my evaluation 6 L/min nasal cannula - will perform infectious workup for pneumonia -Respiratory viral panel -serum fungitell -legionella ab -strep pneumoniae ur AG -Histoplasma Ur Ag -sputum resp cultures -AFB sputum expectorated specimen -sputum cytology  -reviewed pertinent  imaging with patient today -CRP -please encourage patient to use incentive spirometer few times each hour while hospitalized.   -MetaNeb therapy for recruitment of left lower lobe consolidated infiltrate -Agree with Rocephin and Zithromax status post cefepime and vancomycin   Metastatic breast cancer -Patient is being followed by palliative care with hematology oncology service -Prognosis very poor at this point which is very unfortunate for this young patient -patient reports analgesia is inadequate and shares she awakes from sleep due to pain and has to ask for pain medication which takes a while after request.  I will schedule her dilaudid at current $RemoveBe'1mg'hAyWwQUgc$  dose so she is not suffering from cancer associated pain     Left lower lobe atelectasis Continue albuterol nebulizer as well as MetaNeb therapy 3 times daily with respiratory therapist   Hypercalcemia of malignancy    -IV fluids and furosemide daily    -No severe EKG changes noted QRS slightly reduced to 90 MS   -nephrology consultation for additional evaluation management      Patient has severe multiple life-threatening active medical conditions which are evaluated and managed during this patient visit.  Thank you for allowing me to participate in the care of this patient.   This document was prepared using Dragon voice recognition software and may include unintentional dictation errors.     Ottie Glazier, M.D.  Division of [place]

## 2021-03-11 NOTE — Progress Notes (Addendum)
?  Progress Note ? ? ?Patient: Kaylee Romero VXB:[id number] DOB: November 27, 1980 DOA: 03/09/2021     2 ?DOS: the patient was seen and examined on 03/11/2021 ?  ?Brief hospital course: ?No notes on file ? ?Assessment and Plan: ?* HCAP (healthcare-associated pneumonia) ?--Recurrent left lower lobe pneumonia vs delayed imaging resolution of previous treated PNA. ?--pulm consulted, rec broadened to zosyn ?Plan: ?--cont zosyn ? ? 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 ? ? ?Hypercalcemia ?--nephrology consulted ?--Zoledronic acid 4 mg administered. ?Plan: ?--decrease NS to 75 ml/hr, per nephro rec ?--d/c furosemide 40 mg IV daily. ? ? ?Depression ?Stable ?Continue venlafaxine ? ?Essential hypertension ?Blood pressure stable ?Continue metoprolol ? 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 ?Continue Xeloda ?--oncology consulted ?--oncology palliative care consulted ?--pain management, per oncology ? ?ADD (attention deficit disorder) ?Stable ?--resume home Adderall ? ? ? ? ?  ? ?Subjective:  ?Pt reported feeling better. ? ? ?Physical Exam: ?Vitals:  ? 03/11/21 0730 03/11/21 0751 03/11/21 1536 03/11/21 1951  ?BP: (!) 169/82  (!) 165/72 (!) 172/81  ?Pulse: 92  (!) 103 94  ?Resp:    17  ?Temp: 97.7 ?F (36.5 ?C)  98.5 ?F (36.9 ?C) 98.9 ?F (37.2 ?C)  ?TempSrc: Oral  Oral Oral  ?SpO2: 97% 96% 97% 97%  ?Weight:      ?Height:      ? ? ?Constitutional: NAD, AAOx3 ?HEENT: conjunctivae and lids normal, EOMI ?CV: No cyanosis.   ?RESP: normal respiratory effort, on RA ?Neuro: II - XII grossly intact.   ?Psych: Normal mood and affect.   ? ? ?Data Reviewed: ? ?Family Communication:  ? ?Disposition: ?Status is: Inpatient ? ? Planned Discharge Destination: Home ?When pulm and  oncology clear for discharge ? ? ? ?Time spent: 35 minutes ? ?Author: ?Enzo Bi, MD ?03/11/2021 8:47 PM ? ?For on call review [email or web address].  ?

## 2021-03-11 NOTE — Progress Notes (Signed)
[place] at [facility] [phone] [phone]   Name: Kaylee Romero Date: 03/11/2021 MRN: [id number]  DOB: 1980-12-19  Patient Care Team: Dion Body, MD as PCP - General (Family Medicine) Gery Pray, MD as Consulting Physician (Radiation Oncology) Jovita Kussmaul, MD as Consulting Physician (General Surgery) Dillingham, Loel Lofty, DO as Attending Physician (Plastic Surgery) Lloyd Huger, MD as Consulting Physician (Oncology)    REASON FOR CONSULTATION: Kaylee Romero is a 41 y.o. 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  Patient then developed recurrence on tamoxifen.  Patient developed worsening shortness of breath and was found to have recurrence in July 2022 with malignant pleural effusion ultimately requiring VATS procedure and Pleurx.  Patient is status post BSO on 02/17/2021. Patient has had second and third opinions from [facility] and [place].  She is on third line chemotherapy for breast cancer.  She is status post XRT to the spine/ribs.  Patient was admitted to the hospital on 03/09/2021 with hypercalcemia.  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   Malignant neoplasm of upper-outer quadrant of right breast in female, estrogen receptor positive [facility])    oncologist--- dr Grayland Ormond;;  first dx 09/ 2020 s/p right lumpectomy w/ node dissection and completed chemoradiation 08/ 2021;;  recurrent 07/ 2022 to lung pleura,liver, and thoracic spine   SOB (shortness of breath)    02-12-2021  pt denies with regular activity but sob with stairs    PAST SURGICAL HISTORY:  Past Surgical History:  Procedure Laterality Date   ABDOMINAL HYSTERECTOMY N/A 02/17/2021   BREAST LUMPECTOMY WITH AXILLARY LYMPH NODE BIOPSY Right 11/21/2018   Procedure: RIGHT BREAST LUMPECTOMY WITH SENTINEL LYMPH NODE BIOPSY;  Surgeon: Jovita Kussmaul, MD;  Location: [place];  Service: General;  Laterality: Right;   BREAST REDUCTION SURGERY Bilateral 11/21/2018   Procedure: BILATERAL MAMMARY REDUCTION  (BREAST);  Surgeon: Wallace Going, DO;  Location: [place];  Service: Plastics;  Laterality: Bilateral;   CHEST TUBE INSERTION Left 07/29/2020   Procedure: INSERTION PLEURAL DRAINAGE CATHETER;  Surgeon: Lajuana Matte, MD;  Location: [place];  Service: Thoracic;  Laterality: Left;   IRRIGATION AND DEBRIDEMENT STERNOCLAVICULAR JOINT-STERNUM AND RIBS N/A 08/13/2020   Procedure: IRRIGATION AND DEBRIDEMENT CHEST WALL ABSCESS;  Surgeon: Lajuana Matte, MD;  Location: [place] [facility];  Service: Cardiothoracic;  Laterality: N/A;   IUD REMOVAL N/A 02/17/2021   Procedure: INTRAUTERINE DEVICE (IUD) REMOVAL;  Surgeon: Linda Hedges, DO;  Location: [place];  Service: Gynecology;  Laterality: N/A;   LAPAROSCOPIC SALPINGO OOPHERECTOMY Bilateral 02/17/2021   Procedure: LAPAROSCOPIC BILATERAL SALPINGO OOPHORECTOMY;  Surgeon: Linda Hedges, DO;  Location: [facility];  Service: Gynecology;  Laterality: Bilateral;   LEFT HEART CATH AND CORONARY ANGIOGRAPHY N/A 02/25/2021   Procedure: LEFT HEART CATH AND  CORONARY ANGIOGRAPHY possible PCI and stent;  Surgeon: Yolonda Kida, MD;  Location: [place] [facility];  Service: Cardiovascular;  Laterality: N/A;   PLEURAL BIOPSY Left 07/29/2020   Procedure: PLEURAL BIOPSY;  Surgeon: Lajuana Matte, MD;  Location: [place];  Service: Thoracic;  Laterality: Left;   PLEURAL EFFUSION DRAINAGE Left 07/29/2020   Procedure: DRAINAGE OF PLEURAL EFFUSION;  Surgeon: Lajuana Matte, MD;  Location: [place];  Service: Thoracic;  Laterality: Left;   PORT-A-CATH REMOVAL N/A 07/06/2019   Procedure: REMOVAL PORT-A-CATH;  Surgeon: Jovita Kussmaul, MD;  Location: [place];  Service: General;  Laterality: N/A;   PORTACATH PLACEMENT N/A 11/21/2018   Procedure: INSERTION LEFT PORT-A-CATH WITH ULTRASOUND GUIDANCE;  Surgeon: Jovita Kussmaul, MD;  Location: [place];  Service: General;  Laterality: N/A;   [place] Left 07/29/2020   Procedure: VIDEO ASSISTED THORACOSCOPY;  Surgeon: Lajuana Matte, MD;  Location: [place];  Service: Thoracic;  Laterality: Left;   WISDOM TOOTH EXTRACTION      HEMATOLOGY/ONCOLOGY HISTORY:  Oncology History  Malignant neoplasm of upper-outer quadrant of right breast in female, estrogen receptor positive ([place])  09/29/2018 Initial Diagnosis   Malignant neoplasm of upper-outer quadrant of right breast in female, estrogen receptor positive ([place])   09/2018 -  Anti-estrogen oral therapy   Tamoxifen; held from 12/05/2018-09/06/2019 for surgery   10/05/2018 Cancer Staging   Staging form: Breast, AJCC 8th Edition - Clinical stage from 10/05/2018: Stage IB (cT2, cN0, cM0, G2, ER+, PR+, HER2-) - Signed by Chauncey Cruel, MD on 04/02/2020 Stage prefix: Initial diagnosis Histologic grading system: 3 grade system    10/13/2018 Genetic Testing   Negative genetic testing. No pathogenic variants identified on the [facility] Breast Cancer STAT Panel + Common Hereditary Cancers Panel. The Common  Hereditary Cancers Panel offered by [facility] includes sequencing and/or deletion duplication testing of the following 47 genes: APC, ATM, AXIN2, BARD1, BMPR1A, BRCA1, BRCA2, BRIP1, CDH1, CDKN2A (p14ARF), CDKN2A (p16INK4a), CKD4, CHEK2, CTNNA1, DICER1, EPCAM (Deletion/duplication testing only), GREM1 (promoter region deletion/duplication testing only), KIT, MEN1, MLH1, MSH2, MSH3, MSH6, MUTYH, NBN, NF1, NHTL1, PALB2, PDGFRA, PMS2, POLD1, POLE, PTEN, RAD50, RAD51C, RAD51D, SDHB, SDHC, SDHD, SMAD4, SMARCA4. STK11, TP53, TSC1, TSC2, and VHL.  The following genes were evaluated for sequence changes only: SDHA and HOXB13 c.251G>A variant only. The report date is 10/13/2018.    11/21/2018 Surgery   Right lumpectomy [name]) [phone]): IDC, grade 2, 2.6 cm, with DCIS. Negative margins. 1/4 lymph nodes positive for macrometastasis. ER/PR positive, HER-2 negative.   12/26/2018 - 06/01/2019 Chemotherapy   dexamethasone (DECADRON) 4 MG tablet, 1 of 1 cycle, Start date: 12/05/2018, End date: 03/02/2019  DOXOrubicin (ADRIAMYCIN) chemo injection 114 mg, 60 mg/m2 = 114 mg, Intravenous,  Once, 4 of 4 cycles. Administration: 114 mg (01/05/2019), 114 mg (01/19/2019), 114 mg (02/02/2019), 114 mg (02/15/2019)  palonosetron (ALOXI) injection 0.25 mg, 0.25 mg, Intravenous,  Once, 1 of 1 cycle. Administration: 0.25 mg (01/05/2019)  pegfilgrastim (NEULASTA ONPRO KIT) injection 6 mg, 6 mg, Subcutaneous, Once, 2 of 2 cycles  pegfilgrastim-jmdb (FULPHILA) injection 6 mg, 6 mg, Subcutaneous,  Once, 4 of 4 cycles. Administration: 6 mg (01/07/2019), 6 mg (01/21/2019), 6 mg (02/06/2019), 6 mg (02/17/2019)  cyclophosphamide (CYTOXAN) 1,140 mg in sodium chloride 0.9 % 250 mL chemo infusion, 600  mg/m2 = 1,140 mg, Intravenous,  Once, 4 of 4 cycles. Administration: 1,140 mg (01/05/2019), 1,140 mg (01/19/2019), 1,140 mg (02/02/2019), 1,140 mg (02/15/2019).  PACLitaxel (TAXOL) 150 mg in sodium chloride 0.9 % 250 mL chemo infusion (</= $RemoveBefor'80mg'GnTdIbxaVzIi$ /m2), 80  mg/m2 = 150 mg, Intravenous,  Once, 12 of 12 cycles. Administration: 150 mg (03/02/2019), 150 mg (03/09/2019), 150 mg (03/23/2019), 150 mg (03/30/2019), 150 mg (04/06/2019), 150 mg (04/20/2019), 150 mg (04/27/2019), 150 mg (05/03/2019), 150 mg (05/11/2019), 150 mg (05/18/2019), 150 mg (05/25/2019), 150 mg (06/01/2019)  fosaprepitant (EMEND) 150 mg  dexamethasone (DECADRON) 12 mg in sodium chloride 0.9 % 145 mL IVPB, , Intravenous,  Once, 4 of 4 cycles. Administration:  (01/05/2019),  (01/19/2019),  (02/02/2019),  (02/15/2019).   06/21/2019 - 08/11/2019 Radiation Therapy   The patient initially received a dose of 50.4 Gy in 28 fractions to the breast using whole-breast tangent fields. This was delivered using a 3-D conformal technique. The pt received a boost delivering an additional 16 Gy in 8 fractions using a electron boost with 50meV electrons. The total dose was 66.4 Gy.    11/12/2019 Cancer Staging   Staging form: Breast, AJCC 8th Edition - Pathologic: Stage IB (pT2, pN1a, cM0, G2, ER+, PR+, HER2-)     Oncotype testing   MammaPrint high risk     ALLERGIES:  is allergic to betadine [povidone-iodine].  MEDICATIONS:  Current Facility-Administered Medications  Medication Dose Route Frequency Provider Last Rate Last Admin   0.9 %  sodium chloride infusion   Intravenous Continuous Murlean Iba, MD 150 mL/hr at 03/11/21 0405 New Bag at 03/11/21 0405   acetaminophen (TYLENOL) tablet 1,000 mg  1,000 mg Oral Q6H PRN Agbata, Tochukwu, MD   1,000 mg at 03/10/21 0829   ALPRAZolam (XANAX) tablet 0.25 mg  0.25 mg Oral BID PRN Agbata, Tochukwu, MD   0.25 mg at 03/10/21 0504   azithromycin (ZITHROMAX) tablet 500 mg  500 mg Oral Daily Benita Gutter, RPH   500 mg at 03/10/21 1716   Chlorhexidine Gluconate Cloth 2 % PADS 6 each  6 each Topical Daily Enzo Bi, MD   6 each at 03/10/21 1717   enoxaparin (LOVENOX) injection 40 mg  40 mg Subcutaneous Q24H Agbata, Tochukwu, MD   40 mg at 03/10/21 2148   feeding supplement  (ENSURE ENLIVE / ENSURE PLUS) liquid 237 mL  237 mL Oral TID BM Enzo Bi, MD   237 mL at 03/10/21 1504   furosemide (LASIX) injection 40 mg  40 mg Intravenous Daily Ottie Glazier, MD   40 mg at 03/10/21 0910   guaiFENesin (ROBITUSSIN) 100 MG/5ML liquid 5 mL  5 mL Oral Q4H PRN Agbata, Tochukwu, MD       HYDROmorphone (DILAUDID) injection 0.5 mg  0.5 mg Intravenous Q4H PRN Treina Arscott, Kirt Boys, NP   0.5 mg at 03/11/21 0802   HYDROmorphone (DILAUDID) tablet 2 mg  2 mg Oral Q4H Enzo Bi, MD   2 mg at 03/11/21 0420   ipratropium-albuterol (DUONEB) 0.5-2.5 (3) MG/3ML nebulizer solution 3 mL  3 mL Nebulization Q4H PRN Agbata, Tochukwu, MD   3 mL at 03/11/21 0751   metoprolol tartrate (LOPRESSOR) tablet 12.5 mg  12.5 mg Oral BID Ottie Glazier, MD   12.5 mg at 03/10/21 2149   morphine (MS CONTIN) 12 hr tablet 30 mg  30 mg Oral Q8H Enzo Bi, MD   30 mg at 03/11/21 0200   ondansetron (ZOFRAN) tablet 4 mg  4 mg Oral Q6H PRN Agbata, Tochukwu,  MD       Or   ondansetron (ZOFRAN) injection 4 mg  4 mg Intravenous Q6H PRN Agbata, Tochukwu, MD   4 mg at 03/09/21 2320   piperacillin-tazobactam (ZOSYN) IVPB 3.375 g  3.375 g Intravenous Q8H Enzo Bi, MD 12.5 mL/hr at 03/11/21 0519 3.375 g at 03/11/21 0519   polyethylene glycol (MIRALAX / GLYCOLAX) packet 17 g  17 g Oral Daily Kaspar Albornoz, Kirt Boys, NP   17 g at 03/10/21 1716   prochlorperazine (COMPAZINE) injection 10 mg  10 mg Intravenous Q6H PRN Athena Masse, MD   10 mg at 03/10/21 1717   senna (SENOKOT) tablet 17.2 mg  2 tablet Oral QHS Allea Kassner, Kirt Boys, NP   17.2 mg at 03/10/21 2149   sodium chloride flush (NS) 0.9 % injection 10-40 mL  10-40 mL Intracatheter Q12H Enzo Bi, MD   10 mL at 03/10/21 2308   sodium chloride flush (NS) 0.9 % injection 10-40 mL  10-40 mL Intracatheter PRN Enzo Bi, MD       venlafaxine XR (EFFEXOR-XR) 24 hr capsule 150 mg  150 mg Oral Q breakfast Enzo Bi, MD   150 mg at 03/10/21 1117    VITAL SIGNS: BP (!) 169/82 (BP Location:  Left Leg)    Pulse 92    Temp 97.7 F (36.5 C) (Oral)    Resp 18    Ht $R'5\' 3"'me$  (1.6 m)    Wt 169 lb 12.1 oz (77 kg)    LMP 01/06/2019 (Approximate)    SpO2 96%    BMI 30.07 kg/m  Filed Weights   03/09/21 0950  Weight: 169 lb 12.1 oz (77 kg)    Estimated body mass index is 30.07 kg/m as calculated from the following:   Height as of this encounter: $RemoveBeforeD'5\' 3"'rhMMOllxvwbTVh$  (1.6 m).   Weight as of this encounter: 169 lb 12.1 oz (77 kg).  LABS: CBC:    Component Value Date/Time   WBC 19.0 (H) 03/11/2021 0330   HGB 11.5 (L) 03/11/2021 0330   HGB 13.1 06/01/2019 1340   HCT 33.3 (L) 03/11/2021 0330   PLT 283 03/11/2021 0330   PLT 322 06/01/2019 1340   MCV 96.8 03/11/2021 0330   NEUTROABS 13.0 (H) 03/09/2021 1026   LYMPHSABS 0.7 03/09/2021 1026   MONOABS 0.5 03/09/2021 1026   EOSABS 0.0 03/09/2021 1026   BASOSABS 0.0 03/09/2021 1026   Comprehensive Metabolic Panel:    Component Value Date/Time   NA 137 03/11/2021 0330   K 2.8 (L) 03/11/2021 0330   CL 97 (L) 03/11/2021 0330   CO2 27 03/11/2021 0330   BUN 14 03/11/2021 0330   CREATININE 0.66 03/11/2021 0330   CREATININE 0.59 06/01/2019 1340   GLUCOSE 88 03/11/2021 0330   CALCIUM 9.6 03/11/2021 0330   AST 95 (H) 03/09/2021 1130   AST 21 06/01/2019 1340   ALT 26 03/09/2021 1130   ALT 26 06/01/2019 1340   ALKPHOS 78 03/09/2021 1130   BILITOT 0.5 03/09/2021 1130   BILITOT 0.5 06/01/2019 1340   PROT 7.2 03/09/2021 1130   ALBUMIN 3.5 03/09/2021 1130    RADIOGRAPHIC STUDIES: DG Chest 2 View  Result Date: 03/09/2021 CLINICAL DATA:  Shortness of breath EXAM: CHEST - 2 VIEW COMPARISON:  Chest radiograph dated February 24, 2021 and CT examination dated February 23, 2021 FINDINGS: The heart is enlarged. Left basilar opacity which may represent atelectasis or infiltrate, unchanged. Multiple bilateral nodular opacities consistent with patient's known metastatic disease is unchanged. No appreciable  pneumothorax. No acute osseous abnormality. IMPRESSION: 1.   Stable cardiomegaly. 2. Left basilar opacity which may represent atelectasis, infiltrate or small effusion, unchanged. 3. Numerous bilateral nodular pulmonary opacities consistent with metastatic disease. Electronically Signed   By: Keane Police D.O.   On: 03/09/2021 11:06   DG Chest 2 View  Result Date: 02/21/2021 CLINICAL DATA:  A 41 year old female presents with shortness of breath and history of breast cancer. EXAM: CHEST - 2 VIEW COMPARISON:  Chest x-ray from October 01, 2020 and CT of the chest from January 27, 2021. FINDINGS: Cardiomediastinal contours and hilar structures are stable. LEFT hemidiaphragm remains obscured. Signs of LEFT pleural effusion and basilar airspace process. Subtle background nodularity has increased in the RIGHT chest over time. No additional areas of frank consolidative changes. On limited assessment there is no acute skeletal process. Evidence of prior RIGHT axillary dissection presumably projecting over the RIGHT chest. IMPRESSION: 1. Malignant effusion in the LEFT chest with further consolidation or enlarging nodules and masses in this area. Difficult to exclude developing infection in the LEFT lung base. 2. Scattered small nodular densities in the RIGHT chest appearing more conspicuous than on previous imaging may represent a combination of known metastatic disease and superimposed infection. Electronically Signed   By: Zetta Bills M.D.   On: 02/21/2021 13:22   CT CHEST WO CONTRAST  Result Date: 03/09/2021 CLINICAL DATA:  Pneumonia, shortness of breath EXAM: CT CHEST WITHOUT CONTRAST TECHNIQUE: Multidetector CT imaging of the chest was performed following the standard protocol without IV contrast. RADIATION DOSE REDUCTION: This exam was performed according to the departmental dose-optimization program which includes automated exposure control, adjustment of the mA and/or kV according to patient size and/or use of iterative reconstruction technique. COMPARISON:  CT  chest angiogram, 02/23/2021, CT chest abdomen pelvis, 01/27/2021 FINDINGS: Cardiovascular: No significant vascular findings. Normal heart size. No pericardial effusion. Mediastinum/Nodes: No enlarged mediastinal, hilar, or axillary lymph nodes. Thyroid gland, trachea, and esophagus demonstrate no significant findings. Lungs/Pleura: Redemonstrated, widespread pulmonary and pleural nodularity, pleural disease more extensive on the left, which appears slightly increased compared to prior examination dated 02/23/2021. There is similar dense consolidation and atelectasis of the inferior left lower lobe and lingula with a small left pleural effusion. Upper Abdomen: No acute abnormality. Multiple hypodense hepatic metastases and multiple peritoneal nodules, not appreciably changed. Musculoskeletal: No chest wall abnormality. Unchanged lytic metastasis of the T10 vertebral body (series 6, image 113). IMPRESSION: 1. Redemonstrated, widespread bilateral pulmonary and pleural metastatic disease, which appears slightly worsened compared to prior examination dated 02/23/2021. 2. There is similar dense consolidation and atelectasis of the inferior left lower lobe and lingula with a small left pleural effusion. This is worrisome for infection or aspiration. 3. Additional hepatic, peritoneal, and osseous metastatic disease, not obviously changed as partially imaged. Electronically Signed   By: Delanna Ahmadi M.D.   On: 03/09/2021 13:17   CT Angio Chest PE W and/or Wo Contrast  Addendum Date: 02/24/2021   ADDENDUM REPORT: 02/24/2021 07:47 ADDENDUM: As stated in the original dictation, left-sided pleural metastasis, widespread pulmonary metastases and hepatic metastases all appear increased compared to prior examinations. In addition, there is consolidative airspace disease in the left lower lobe, concerning for pneumonia. Electronically Signed   By: Vinnie Langton M.D.   On: 02/24/2021 07:47   Result Date: 02/24/2021 CLINICAL  DATA:  Pulmonary embolism suspected. High probability. Metastatic breast cancer. Shortness of breath. EXAM: CT ANGIOGRAPHY CHEST WITH CONTRAST TECHNIQUE: Multidetector CT imaging of the chest  was performed using the standard protocol during bolus administration of intravenous contrast. Multiplanar CT image reconstructions and MIPs were obtained to evaluate the vascular anatomy. RADIATION DOSE REDUCTION: This exam was performed according to the departmental dose-optimization program which includes automated exposure control, adjustment of the mA and/or kV according to patient size and/or use of iterative reconstruction technique. CONTRAST:  123mL OMNIPAQUE IOHEXOL 350 MG/ML SOLN COMPARISON:  Chest radiography same day.  Chest CT 01/27/2021. FINDINGS: Cardiovascular: Cardiomegaly with left ventricular prominence. No visible aortic atherosclerotic calcification. Pulmonary arterial opacification is good. There are no pulmonary emboli. Mediastinum/Nodes: Over the last month, there has been considerable growth and mediastinal tumor. Anterior mediastinal disease previously measured at 15 and 17 mm on axial image 32 now measures 17 and 20 mm. Increased tumor growth in the subcarinal region and left hilum and along the left pericardial margin. Lungs/Pleura: Marked worsening of pulmonary metastatic disease. Innumerable nodules scattered throughout the lungs measuring 9 mm in size and smaller. There is worsening collapse of the left lower lobe. Lobular pleural disease on the left has worsened. Upper Abdomen: Worsening of hepatic metastatic disease. More numerous and larger metastatic lesions. For example, metastasis at the dome of the liver on the right previously measured 18 mm in diameter and now measures 21 mm in diameter. Musculoskeletal: Lytic sclerotic metastatic disease throughout the spine and ribs appears similar. Review of the MIP images confirms the above findings. IMPRESSION: No pulmonary emboli. Progression of  metastatic tumor within the mediastinum, left hilum, manifest as nodular metastatic lesions throughout both lungs, and within the liver. Worsened volume loss in the left lower lobe with progression of pleural disease. This interpretation is rendered on an emergent basis in order to rule out pulmonary emboli. The exam will be reviewed and addended by body/oncologic specialist radiologist tomorrow. Electronically Signed: By: Nelson Chimes M.D. On: 02/23/2021 21:42   CARDIAC CATHETERIZATION  Result Date: 02/25/2021   The left ventricular systolic function is normal.   LV end diastolic pressure is normal.   The left ventricular ejection fraction is 55-65% by visual estimate. Conclusion Normal left ventricular function EF of 60% Normal coronaries Intervention deferred No conclusion   DG Chest Port 1 View  Result Date: 02/24/2021 CLINICAL DATA:  Shortness of breath, chest pain EXAM: PORTABLE CHEST 1 VIEW COMPARISON:  Previous studies including the examination of 02/23/2021 FINDINGS: Transverse diameter of heart is increased. There is opacification of left lower lung fields consistent with pleural effusion and possibly underlying infiltrate with no significant change. Are possible small nodular densities in the right mid and right lower lung fields. Right lateral CP angle is clear. There is no pneumothorax. IMPRESSION: No significant interval changes are noted in the opacification of left lower lung fields suggesting pleural effusion and possibly underlying infiltrate. Electronically Signed   By: Elmer Picker M.D.   On: 02/24/2021 15:06   DG Chest Portable 1 View  Result Date: 02/23/2021 CLINICAL DATA:  Chest pain and shortness of breath. History of metastatic breast cancer. EXAM: PORTABLE CHEST 1 VIEW COMPARISON:  02/21/2021 and prior radiograph FINDINGS: The cardiomediastinal silhouette is unchanged. LEFT LOWER lung consolidation/atelectasis and LEFT pleural thickening again noted. No new pulmonary  findings are noted. There is no evidence of pneumothorax or acute bony abnormality. IMPRESSION: Unchanged appearance of the chest with LEFT LOWER lung consolidation/atelectasis and LEFT pleural thickening. Electronically Signed   By: Margarette Canada M.D.   On: 02/23/2021 20:42   [place] EKG SITE RITE  Result Date: 03/10/2021 If  Site Rite image not attached, placement could not be confirmed due to current cardiac rhythm.   PERFORMANCE STATUS (ECOG) : 1 - Symptomatic but completely ambulatory  Review of Systems Unless otherwise noted, a complete review of systems is negative.  Physical Exam General: NAD Pulmonary: Unlabored Extremities: no edema, no joint deformities Skin: no rashes Neurological: Weakness but otherwise nonfocal  IMPRESSION: Follow-up visit.  Serum calcium now within normal range.  Patient reports that she did not get much sleep last night due to pain.  Pain is intermittent and most effectively relieved with IV hydromorphone.  However, she describes this is quite short-lived.  She reports that she cannot tell much improvement with oral hydromorphone or MS Contin.  Total oral MME in past 24 hours is approximately 205 mg.  Discussed pain regimen in detail with patient.  We will plan to liberalize MS Contin and attempt to provide longer acting pain control.  We will rotate oral hydromorphone to as needed and liberalize dose range.  We will also start NSAID for inflammatory pain.  Still no bowel movement.  We will plan to liberalize bowel regimen  Patient is noted to be hypokalemic today.  Please replete K  PLAN: -Continue current scope of treatment -Increase MS Contin 45 mg every 8 hours -Increase hydromorphone p.o. 2 to 4 mg every 4 hours as needed -Continue hydromorphone IV as backup but would prefer to maximize oral medications in anticipation of discharge -Add Toradol 15-30 mg IV every 8 hours as needed -Increase MiraLAX/senna to twice daily -Please replete K -Follow-up  CMP  Case and plan discussed with Dr. Grayland Ormond   Time Total: 20 minutes  Visit consisted of counseling and education dealing with the complex and emotionally intense issues of symptom management and palliative care in the setting of serious and potentially life-threatening illness.Greater than 50%  of this time was spent counseling and coordinating care related to the above assessment and plan.  Signed by: Altha Harm, PhD, NP-C

## 2021-03-11 NOTE — Progress Notes (Addendum)
[facility]  ROUNDING NOTE   Subjective:   Patient seen resting quietly in bed Appetite remains poor due to nausea  Denies shortness of breath Generalized pain improved  Calcium 9.6  Objective:  Vital signs in last 24 hours:  Temp:  [97.7 F (36.5 C)-99.7 F (37.6 C)] 97.7 F (36.5 C) (03/07 0730) Pulse Rate:  [86-105] 92 (03/07 0730) Resp:  [16-18] 18 (03/07 0345) BP: (160-178)/(73-97) 169/82 (03/07 0730) SpO2:  [92 %-97 %] 96 % (03/07 0751)  Weight change:  Filed Weights   03/09/21 0950  Weight: 77 kg    Intake/Output: I/O last 3 completed shifts: In: 2089.2 [P.O.:120; I.V.:1571.2; IV Piggyback:398.1] Out: -    Intake/Output this shift:  No intake/output data recorded.  Physical Exam: General: NAD  Head: Normocephalic, atraumatic. Moist oral mucosal membranes  Eyes: Anicteric  Lungs:  Clear to auscultation, normal effort  Heart: Regular rate and rhythm  Abdomen:  Soft, nontender  Extremities: No peripheral edema.  Neurologic: Nonfocal, moving all four extremities  Skin: No lesions       Basic Metabolic Panel: Recent Labs  Lab 03/09/21 1130 03/10/21 0815 03/11/21 0330  NA 140 140 137  K 3.8 3.5 2.8*  CL 97* 104 97*  CO2 29 21* 27  GLUCOSE 125* 88 88  BUN '14 16 14  '$ CREATININE 0.70 0.70 0.66  CALCIUM 13.1* QUANTITY NOT SUFFICIENT, UNABLE TO PERFORM TEST   10.7* 9.6  MG  --   --  1.9     Liver Function Tests: Recent Labs  Lab 03/09/21 1130  AST 95*  ALT 26  ALKPHOS 78  BILITOT 0.5  PROT 7.2  ALBUMIN 3.5    Recent Labs  Lab 03/09/21 1130  LIPASE 22    No results for input(s): AMMONIA in the last 168 hours.  CBC: Recent Labs  Lab 03/09/21 1026 03/10/21 0815 03/11/21 0330  WBC 14.4* 17.9* 19.0*  NEUTROABS 13.0*  --   --   HGB 13.4 11.6* 11.5*  HCT 39.8 34.1* 33.3*  MCV 96.4 98.3 96.8  PLT 348 299 283     Cardiac Enzymes: No results for input(s): CKTOTAL, CKMB, CKMBINDEX, TROPONINI in the last 168  hours.  BNP: Invalid input(s): POCBNP  CBG: No results for input(s): GLUCAP in the last 168 hours.  Microbiology: Results for orders placed or performed during the hospital encounter of 03/09/21  Resp Panel by RT-PCR (Flu A&B, Covid) Nasopharyngeal Swab     Status: None   Collection Time: 03/09/21 10:26 AM   Specimen: Nasopharyngeal Swab; Nasopharyngeal(NP) swabs in vial transport medium  Result Value Ref Range Status   SARS Coronavirus 2 by RT PCR NEGATIVE NEGATIVE Final    Comment: (NOTE) SARS-CoV-2 target nucleic acids are NOT DETECTED.  The SARS-CoV-2 RNA is generally detectable in upper respiratory specimens during the acute phase of infection. The lowest concentration of SARS-CoV-2 viral copies this assay can detect is 138 copies/mL. A negative result does not preclude SARS-Cov-2 infection and should not be used as the sole basis for treatment or other patient management decisions. A negative result may occur with  improper specimen collection/handling, submission of specimen other than nasopharyngeal swab, presence of viral mutation(s) within the areas targeted by this assay, and inadequate number of viral copies(<138 copies/mL). A negative result must be combined with clinical observations, patient history, and epidemiological information. The expected result is Negative.  Fact Sheet for Patients:  EntrepreneurPulse.com.au  Fact Sheet for Healthcare Providers:  IncredibleEmployment.be  This test is  no t yet approved or cleared by the [place] and  has been authorized for detection and/or diagnosis of SARS-CoV-2 by FDA under an Emergency Use Authorization (EUA). This EUA will remain  in effect (meaning this test can be used) for the duration of the COVID-19 declaration under Section 564(b)(1) of the Act, 21 U.S.C.section 360bbb-3(b)(1), unless the authorization is terminated  or revoked sooner.       Influenza A by PCR  NEGATIVE NEGATIVE Final   Influenza B by PCR NEGATIVE NEGATIVE Final    Comment: (NOTE) The Xpert Xpress SARS-CoV-2/FLU/RSV plus assay is intended as an aid in the diagnosis of influenza from Nasopharyngeal swab specimens and should not be used as a sole basis for treatment. Nasal washings and aspirates are unacceptable for Xpert Xpress SARS-CoV-2/FLU/RSV testing.  Fact Sheet for Patients: EntrepreneurPulse.com.au  Fact Sheet for Healthcare Providers: IncredibleEmployment.be  This test is not yet approved or cleared by the [place] FDA and has been authorized for detection and/or diagnosis of SARS-CoV-2 by FDA under an Emergency Use Authorization (EUA). This EUA will remain in effect (meaning this test can be used) for the duration of the COVID-19 declaration under Section 564(b)(1) of the Act, 21 U.S.C. section 360bbb-3(b)(1), unless the authorization is terminated or revoked.  Performed at [facility], [address] [place], [place] [address]   MRSA Next Gen by PCR, Nasal     Status: None   Collection Time: 03/09/21 10:09 PM   Specimen: Nasal Mucosa; Nasal Swab  Result Value Ref Range Status   MRSA by PCR Next Gen NOT DETECTED NOT DETECTED Final    Comment: (NOTE) The GeneXpert MRSA Assay (FDA approved for NASAL specimens only), is one component of a comprehensive MRSA colonization surveillance program. It is not intended to diagnose MRSA infection nor to guide or monitor treatment for MRSA infections. Test performance is not FDA approved in patients less than 59 years old. Performed at [facility], [place], [place] [address]   Respiratory (~20 pathogens) panel by PCR     Status: None   Collection Time: 03/09/21 10:09 PM   Specimen: Nasal Mucosa; Respiratory  Result Value Ref Range Status   Adenovirus NOT DETECTED NOT DETECTED Final   Coronavirus 229E NOT DETECTED NOT DETECTED Final     Comment: (NOTE) The Coronavirus on the Respiratory Panel, DOES NOT test for the novel  Coronavirus (2019 nCoV)    Coronavirus HKU1 NOT DETECTED NOT DETECTED Final   Coronavirus NL63 NOT DETECTED NOT DETECTED Final   Coronavirus OC43 NOT DETECTED NOT DETECTED Final   Metapneumovirus NOT DETECTED NOT DETECTED Final   Rhinovirus / Enterovirus NOT DETECTED NOT DETECTED Final   Influenza A NOT DETECTED NOT DETECTED Final   Influenza B NOT DETECTED NOT DETECTED Final   Parainfluenza Virus 1 NOT DETECTED NOT DETECTED Final   Parainfluenza Virus 2 NOT DETECTED NOT DETECTED Final   Parainfluenza Virus 3 NOT DETECTED NOT DETECTED Final   Parainfluenza Virus 4 NOT DETECTED NOT DETECTED Final   Respiratory Syncytial Virus NOT DETECTED NOT DETECTED Final   Bordetella pertussis NOT DETECTED NOT DETECTED Final   Bordetella Parapertussis NOT DETECTED NOT DETECTED Final   Chlamydophila pneumoniae NOT DETECTED NOT DETECTED Final   Mycoplasma pneumoniae NOT DETECTED NOT DETECTED Final    Comment: Performed at [facility], [place]. [address] [place], [place] [address]    Coagulation Studies: No results for input(s): LABPROT, INR in the last 72 hours.  Urinalysis: Recent Labs    03/09/21 1026  COLORURINE YELLOW*  LABSPEC 1.011  PHURINE 8.0  GLUCOSEU NEGATIVE  HGBUR NEGATIVE  BILIRUBINUR NEGATIVE  KETONESUR NEGATIVE  PROTEINUR NEGATIVE  NITRITE NEGATIVE  LEUKOCYTESUR NEGATIVE       Imaging: [place] EKG SITE RITE  Result Date: 03/10/2021 If Site Rite image not attached, placement could not be confirmed due to current cardiac rhythm.    Medications:    sodium chloride 75 mL/hr at 03/11/21 1244   piperacillin-tazobactam (ZOSYN)  IV 3.375 g (03/11/21 0519)    azithromycin  500 mg Oral Daily   Chlorhexidine Gluconate Cloth  6 each Topical Daily   enoxaparin (LOVENOX) injection  40 mg Subcutaneous Q24H   feeding supplement  237 mL Oral TID BM   furosemide  40 mg Intravenous Daily    metoprolol tartrate  12.5 mg Oral BID   morphine  15 mg Oral Q8H   morphine  30 mg Oral Q8H   polyethylene glycol  17 g Oral BID   potassium chloride  40 mEq Oral Q4H   senna  2 tablet Oral BID   sodium chloride flush  10-40 mL Intracatheter Q12H   venlafaxine XR  150 mg Oral Q breakfast   acetaminophen, ALPRAZolam, guaiFENesin, HYDROmorphone (DILAUDID) injection, HYDROmorphone, ipratropium-albuterol, ketorolac, ondansetron **OR** ondansetron (ZOFRAN) IV, prochlorperazine, sodium chloride flush  Assessment/ Plan:  Ms. Kaylee Romero is a 41 y.o.  female with a past medical history of stage IV breast cancer who was admitted for Hypercalcemia [E83.52] HCAP (healthcare-associated pneumonia) [J18.9]   Hypercalcemia, likely due to multiple factors But most likely due to progressive malignancy - Zoledronic acid 4 mg administered.   - Calcium improved to 9.6. -Will decrease iv fluids to NS 75 mL/h. IV furosemide stopped..   2.  Community-acquired pneumonia, recurrent  Continue IV antibiotics.  Pulmonology following  3.  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 Hypokalemia Potassium 2.8. Primary team has ordered oral potassium chloride 61mq every 4 hours for 3 doses.   Due to improved calcium, we will sign off at this time. Feel free to contact [place] questions or concerns.    LOS: 2   3/7/20232:46 PM

## 2021-03-12 ENCOUNTER — Ambulatory Visit: Payer: BC Managed Care – PPO | Admitting: Radiation Oncology

## 2021-03-12 LAB — CBC
HCT: 33.4 % — ABNORMAL LOW (ref 36.0–46.0)
Hemoglobin: 11.4 g/dL — ABNORMAL LOW (ref 12.0–15.0)
MCH: 33 pg (ref 26.0–34.0)
MCHC: 34.1 g/dL (ref 30.0–36.0)
MCV: 96.8 fL (ref 80.0–100.0)
Platelets: 273 10*3/uL (ref 150–400)
RBC: 3.45 MIL/uL — ABNORMAL LOW (ref 3.87–5.11)
RDW: 14.6 % (ref 11.5–15.5)
WBC: 14.8 10*3/uL — ABNORMAL HIGH (ref 4.0–10.5)
nRBC: 0.1 % (ref 0.0–0.2)

## 2021-03-12 LAB — BASIC METABOLIC PANEL
Anion gap: 11 (ref 5–15)
BUN: 14 mg/dL (ref 6–20)
CO2: 27 mmol/L (ref 22–32)
Calcium: 9.1 mg/dL (ref 8.9–10.3)
Chloride: 99 mmol/L (ref 98–111)
Creatinine, Ser: 0.51 mg/dL (ref 0.44–1.00)
GFR, Estimated: >60 mL/min (ref >60)
Glucose, Bld: 89 mg/dL (ref 70–99)
Potassium: 3.6 mmol/L (ref 3.5–5.1)
Sodium: 137 mmol/L (ref 135–145)

## 2021-03-12 LAB — MAGNESIUM: Magnesium: 1.9 mg/dL (ref 1.7–2.4)

## 2021-03-12 LAB — FUNGITELL, SERUM: Fungitell Result: <31 pg/mL (ref <80)

## 2021-03-12 MED ORDER — HYDROMORPHONE HCL 1 MG/ML IJ SOLN
0.5000 mg | INTRAMUSCULAR | Status: DC | PRN
  Administered 2021-03-12 – 2021-03-13 (×6): 0.5 mg via INTRAVENOUS
  Filled 2021-03-12 (×6): qty 0.5

## 2021-03-12 MED ORDER — HYDROMORPHONE HCL 2 MG PO TABS
4.0000 mg | ORAL_TABLET | ORAL | Status: DC | PRN
  Administered 2021-03-12: 4 mg via ORAL
  Filled 2021-03-12: qty 2

## 2021-03-12 MED ORDER — DEXAMETHASONE 4 MG PO TABS
4.0000 mg | ORAL_TABLET | Freq: Every day | ORAL | Status: DC
  Administered 2021-03-12 – 2021-03-13 (×2): 4 mg via ORAL
  Filled 2021-03-12 (×2): qty 1

## 2021-03-12 NOTE — Progress Notes (Signed)
PULMONOLOGY         Date: 03/12/2021,   MRN# [id number] ALTAGRACIA RONE May 15, 1980     AdmissionWeight: 77 kg                 CurrentWeight: 77 kg   Referring physician: Dr Francine Graven   CHIEF COMPLAINT:   Recurrent malignant pleural effusion   HISTORY OF PRESENT ILLNESS   This is a 41 year old unfortunate patient with history of breast cancer and multiple additional comorbid conditions, evaluated by me last year for dyspnea found to have pleural effusion.  Initial thoracentesis done June 2022 was negative for atypia, patient subsequently had surgical biopsy of area of PET avid abnormal pleural thickening showing PR and ER positive non-small cell CA. recently patient did develop left lower lobe opacifications which was deemed to be pneumonia status posttreatment with levofloxacin and subsequent transient improvement followed by worsening and readmission showing reaccumulation of left pleural effusion.  On arrival symptoms included cough and dyspnea patient was placed on empiric antimicrobials for community-acquired pneumonia with cefepime and vancomycin.  CT chest was performed which I independently reviewed showing innumerable metastatic implants bilaterally in the lungs as well as previously noted abnormal thickening of the pleura, left lower lobe consolidation with severe narrowing of the left lower lobe bronchioles.  Blood work including CMP shows significant hypercalcemia at 13.1. Pulmonary consultation placed for additional evaluation management.    03/10/21- patient is stable without overnight events. Analgesia regiment modified. Reviewed antibiotics with change to zosyn. S/p nephrology, med/onc, palliative and hospitalist evaluaton appeciate everyone involved in patients care.   03/11/21- patient with no acute events overnight. She is resting with mild pain at this point. She is being seen by nephrology and oncology.   03/12/21- Patient seen at bedside with brother of patient  present in room. We discussed pain level which is adequately controlled. Her breathing is not improved but stays normoxic on room air. We discussed outpatient follow up. She had TPC placed in the past with resultant infection and sepsis of pleural cavity.  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   Malignant neoplasm of upper-outer quadrant of right breast in female, estrogen receptor positive [facility])    oncologist--- dr Grayland Ormond;;  first dx 09/ 2020 s/p right lumpectomy w/ node dissection and completed chemoradiation 08/ 2021;;  recurrent 07/ 2022 to lung pleura,liver, and thoracic spine   SOB (shortness of breath)    02-12-2021  pt denies with regular activity but sob with stairs     SURGICAL HISTORY   Past Surgical History:  Procedure Laterality Date   ABDOMINAL HYSTERECTOMY N/A 02/17/2021   BREAST LUMPECTOMY WITH AXILLARY LYMPH NODE BIOPSY Right 11/21/2018   Procedure: RIGHT BREAST LUMPECTOMY WITH SENTINEL LYMPH NODE BIOPSY;  Surgeon: Jovita Kussmaul, MD;  Location: [place];  Service: General;  Laterality: Right;   BREAST REDUCTION SURGERY Bilateral 11/21/2018   Procedure: BILATERAL MAMMARY REDUCTION  (BREAST);  Surgeon: Wallace Going, DO;  Location: [place];  Service: Plastics;  Laterality: Bilateral;   CHEST  TUBE INSERTION Left 07/29/2020   Procedure: INSERTION PLEURAL DRAINAGE CATHETER;  Surgeon: Lajuana Matte, MD;  Location: [place];  Service: Thoracic;  Laterality: Left;   IRRIGATION AND DEBRIDEMENT STERNOCLAVICULAR JOINT-STERNUM AND RIBS N/A 08/13/2020   Procedure: IRRIGATION AND  DEBRIDEMENT CHEST WALL ABSCESS;  Surgeon: Lajuana Matte, MD;  Location: [place] [facility];  Service: Cardiothoracic;  Laterality: N/A;   IUD REMOVAL N/A 02/17/2021   Procedure: INTRAUTERINE DEVICE (IUD) REMOVAL;  Surgeon: Linda Hedges, DO;  Location: [place];  Service: Gynecology;  Laterality: N/A;   LAPAROSCOPIC SALPINGO OOPHERECTOMY Bilateral 02/17/2021   Procedure: LAPAROSCOPIC BILATERAL SALPINGO OOPHORECTOMY;  Surgeon: Linda Hedges, DO;  Location: [place];  Service: Gynecology;  Laterality: Bilateral;   LEFT HEART CATH AND CORONARY ANGIOGRAPHY N/A 02/25/2021   Procedure: LEFT HEART CATH AND CORONARY ANGIOGRAPHY possible PCI and stent;  Surgeon: Yolonda Kida, MD;  Location: [place] [facility];  Service: Cardiovascular;  Laterality: N/A;   PLEURAL BIOPSY Left 07/29/2020   Procedure: PLEURAL BIOPSY;  Surgeon: Lajuana Matte, MD;  Location: [place];  Service: Thoracic;  Laterality: Left;   PLEURAL EFFUSION DRAINAGE Left 07/29/2020   Procedure: DRAINAGE OF PLEURAL EFFUSION;  Surgeon: Lajuana Matte, MD;  Location: [place];  Service: Thoracic;  Laterality: Left;   PORT-A-CATH REMOVAL N/A 07/06/2019   Procedure: REMOVAL PORT-A-CATH;  Surgeon: Jovita Kussmaul, MD;  Location: [place];  Service: General;  Laterality: N/A;   PORTACATH PLACEMENT N/A 11/21/2018   Procedure: INSERTION LEFT PORT-A-CATH WITH ULTRASOUND GUIDANCE;  Surgeon: Jovita Kussmaul, MD;  Location: [place];  Service: General;  Laterality: N/A;   TONSILLECTOMY  1987   VIDEO ASSISTED THORACOSCOPY Left 07/29/2020   Procedure: VIDEO ASSISTED THORACOSCOPY;  Surgeon: Lajuana Matte, MD;  Location: [place];  Service: Thoracic;  Laterality: Left;   WISDOM TOOTH EXTRACTION       FAMILY HISTORY   Family History  Problem Relation Age of Onset   Hypertension Father    Alcohol abuse Paternal Grandfather    Heart disease Paternal Grandfather    Alcohol abuse  Maternal Grandfather    Heart disease Maternal Grandmother    Breast cancer Other      SOCIAL HISTORY   Social History   Tobacco Use   Smoking status: Former    Packs/day: 0.50    Years: 20.00    Pack years: 10.00    Types: Cigarettes    Start date: 01/13/2002    Quit date: 10/17/2016    Years since quitting: 4.4   Smokeless tobacco: Never  Vaping Use   Vaping Use: Never used  Substance Use Topics   Alcohol use: Yes    Comment: Socially   Drug use: Never     MEDICATIONS    Home Medication:     Current Medication:  Current Facility-Administered Medications:    0.9 %  sodium chloride infusion, , Intravenous, Continuous, Breeze, Shantelle, NP, Last Rate: 75 mL/hr at 03/12/21 1129, New Bag at 03/12/21 1129   acetaminophen (TYLENOL) tablet 1,000 mg, 1,000 mg, Oral, Q6H PRN, Agbata, Tochukwu, MD, 1,000 mg at 03/10/21 4765   ALPRAZolam (XANAX) tablet 0.25 mg, 0.25 mg, Oral, BID PRN, Agbata, Tochukwu, MD, 0.25 mg at 03/11/21 1750   amphetamine-dextroamphetamine (ADDERALL XR) 24 hr capsule 30 mg, 30 mg, Oral, q morning, Enzo Bi, MD   azithromycin [facility]) tablet 500 mg, 500 mg, Oral, Daily, Benita Gutter, RPH, 500 mg at 03/12/21 4650   Chlorhexidine  Gluconate Cloth 2 % PADS 6 each, 6 each, Topical, Daily, Enzo Bi, MD, 6 each at 03/12/21 0908   dexamethasone (DECADRON) tablet 4 mg, 4 mg, Oral, Daily, Sreenath, Sudheer B, MD, 4 mg at 03/12/21 1225   enoxaparin (LOVENOX) injection 40 mg, 40 mg, Subcutaneous, Q24H, Agbata, Tochukwu, MD, 40 mg at 03/11/21 2202   feeding supplement (ENSURE ENLIVE / ENSURE PLUS) liquid 237 mL, 237 mL, Oral, TID BM, Enzo Bi, MD, 237 mL at 03/12/21 1442   guaiFENesin (ROBITUSSIN) 100 MG/5ML liquid 5 mL, 5 mL, Oral, Q4H PRN, Agbata, Tochukwu, MD, 5 mL at 03/12/21 0230   HYDROmorphone (DILAUDID) injection 0.5 mg, 0.5 mg, Intravenous, Q3H PRN, Priscella Mann, Sudheer B, MD, 0.5 mg at 03/12/21 1550   HYDROmorphone (DILAUDID) tablet 4  mg, 4 mg, Oral, Q4H PRN, Borders, Kirt Boys, NP   ipratropium-albuterol (DUONEB) 0.5-2.5 (3) MG/3ML nebulizer solution 3 mL, 3 mL, Nebulization, Q4H PRN, Agbata, Tochukwu, MD, 3 mL at 03/12/21 0833   ketorolac (TORADOL) 15 MG/ML injection 15-30 mg, 15-30 mg, Intravenous, Q8H PRN, Borders, Kirt Boys, NP, 15 mg at 03/12/21 0925   metoprolol tartrate (LOPRESSOR) tablet 12.5 mg, 12.5 mg, Oral, BID, Lanney Gins, Adel Neyer, MD, 12.5 mg at 03/12/21 0905   morphine (MS CONTIN) 12 hr tablet 15 mg, 15 mg, Oral, Q8H, Borders, Joshua R, NP, 15 mg at 03/12/21 1436   morphine (MS CONTIN) 12 hr tablet 30 mg, 30 mg, Oral, Q8H, Borders, Joshua R, NP, 30 mg at 03/12/21 0906   ondansetron (ZOFRAN) tablet 4 mg, 4 mg, Oral, Q6H PRN **OR** ondansetron (ZOFRAN) injection 4 mg, 4 mg, Intravenous, Q6H PRN, Agbata, Tochukwu, MD, 4 mg at 03/12/21 0902   piperacillin-tazobactam (ZOSYN) IVPB 3.375 g, 3.375 g, Intravenous, Q8H, Enzo Bi, MD, Last Rate: 12.5 mL/hr at 03/12/21 1439, 3.375 g at 03/12/21 1439   polyethylene glycol (MIRALAX / GLYCOLAX) packet 17 g, 17 g, Oral, BID, Borders, Kirt Boys, NP, 17 g at 03/12/21 5183   prochlorperazine (COMPAZINE) injection 10 mg, 10 mg, Intravenous, Q6H PRN, Athena Masse, MD, 10 mg at 03/10/21 1717   senna (SENOKOT) tablet 17.2 mg, 2 tablet, Oral, BID, Borders, Kirt Boys, NP, 17.2 mg at 03/12/21 4373   sodium chloride flush (NS) 0.9 % injection 10-40 mL, 10-40 mL, Intracatheter, Q12H, Enzo Bi, MD, 10 mL at 03/12/21 0908   sodium chloride flush (NS) 0.9 % injection 10-40 mL, 10-40 mL, Intracatheter, PRN, Enzo Bi, MD   venlafaxine XR (EFFEXOR-XR) 24 hr capsule 150 mg, 150 mg, Oral, Q breakfast, Enzo Bi, MD, 150 mg at 03/12/21 5789    ALLERGIES   Betadine [povidone-iodine]     REVIEW OF SYSTEMS    Review of Systems:  Gen:  Denies  fever, sweats, chills weigh loss  HEENT: Denies blurred vision, double vision, ear pain, eye pain, hearing loss, nose bleeds, sore  throat Cardiac:  No dizziness, chest pain or heaviness, chest tightness,edema Resp:   Denies cough or sputum porduction, shortness of breath,wheezing, hemoptysis,  Gi: Denies swallowing difficulty, stomach pain, nausea or vomiting, diarrhea, constipation, bowel incontinence Gu:  Denies bladder incontinence, burning urine Ext:   Denies Joint pain, stiffness or swelling Skin: Denies  skin rash, easy bruising or bleeding or hives Endoc:  Denies polyuria, polydipsia , polyphagia or weight change Psych:   Denies depression, insomnia or hallucinations   Other:  All other systems negative   VS: BP (!) 144/68 (BP Location: Right Leg)    Pulse (!) 105    Temp 97.8  F (36.6 C) (Oral)    Resp 17    Ht $R'5\' 3"'qJ$  (1.6 m)    Wt 77 kg    LMP 01/06/2019 (Approximate)    SpO2 93%    BMI 30.07 kg/m      PHYSICAL EXAM    GENERAL:NAD, no fevers, chills, no weakness no fatigue HEAD: Normocephalic, atraumatic.  EYES: Pupils equal, round, reactive to light. Extraocular muscles intact. No scleral icterus.  MOUTH: Moist mucosal membrane. Dentition intact. No abscess noted.  EAR, NOSE, THROAT: Clear without exudates. No external lesions.  NECK: Supple. No thyromegaly. No nodules. No JVD.  PULMONARY: Clear to auscultation with decreased air entry bilaterlly CARDIOVASCULAR: S1 and S2. Regular rate and rhythm. No murmurs, rubs, or gallops. No edema. Pedal pulses 2+ bilaterally.  GASTROINTESTINAL: Soft, nontender, nondistended. No masses. Positive bowel sounds. No hepatosplenomegaly.  MUSCULOSKELETAL: No swelling, clubbing, or edema. Range of motion full in all extremities.  NEUROLOGIC: Cranial nerves II through XII are intact. No gross focal neurological deficits. Sensation intact. Reflexes intact.  SKIN: No ulceration, lesions, rashes, or cyanosis. Skin warm and dry. Turgor intact.  PSYCHIATRIC: Mood, affect within normal limits. The patient is awake, alert and oriented x 3. Insight, judgment intact.        IMAGING    DG Chest 2 View  Result Date: 03/09/2021 CLINICAL DATA:  Shortness of breath EXAM: CHEST - 2 VIEW COMPARISON:  Chest radiograph dated February 24, 2021 and CT examination dated February 23, 2021 FINDINGS: The heart is enlarged. Left basilar opacity which may represent atelectasis or infiltrate, unchanged. Multiple bilateral nodular opacities consistent with patient's known metastatic disease is unchanged. No appreciable pneumothorax. No acute osseous abnormality. IMPRESSION: 1.  Stable cardiomegaly. 2. Left basilar opacity which may represent atelectasis, infiltrate or small effusion, unchanged. 3. Numerous bilateral nodular pulmonary opacities consistent with metastatic disease. Electronically Signed   By: Keane Police D.O.   On: 03/09/2021 11:06   DG Chest 2 View  Result Date: 02/21/2021 CLINICAL DATA:  A 42 year old female presents with shortness of breath and history of breast cancer. EXAM: CHEST - 2 VIEW COMPARISON:  Chest x-ray from October 01, 2020 and CT of the chest from January 27, 2021. FINDINGS: Cardiomediastinal contours and hilar structures are stable. LEFT hemidiaphragm remains obscured. Signs of LEFT pleural effusion and basilar airspace process. Subtle background nodularity has increased in the RIGHT chest over time. No additional areas of frank consolidative changes. On limited assessment there is no acute skeletal process. Evidence of prior RIGHT axillary dissection presumably projecting over the RIGHT chest. IMPRESSION: 1. Malignant effusion in the LEFT chest with further consolidation or enlarging nodules and masses in this area. Difficult to exclude developing infection in the LEFT lung base. 2. Scattered small nodular densities in the RIGHT chest appearing more conspicuous than on previous imaging may represent a combination of known metastatic disease and superimposed infection. Electronically Signed   By: Zetta Bills M.D.   On: 02/21/2021 13:22   CT CHEST WO  CONTRAST  Result Date: 03/09/2021 CLINICAL DATA:  Pneumonia, shortness of breath EXAM: CT CHEST WITHOUT CONTRAST TECHNIQUE: Multidetector CT imaging of the chest was performed following the standard protocol without IV contrast. RADIATION DOSE REDUCTION: This exam was performed according to the departmental dose-optimization program which includes automated exposure control, adjustment of the mA and/or kV according to patient size and/or use of iterative reconstruction technique. COMPARISON:  CT chest angiogram, 02/23/2021, CT chest abdomen pelvis, 01/27/2021 FINDINGS: Cardiovascular: No significant vascular findings. Normal  heart size. No pericardial effusion. Mediastinum/Nodes: No enlarged mediastinal, hilar, or axillary lymph nodes. Thyroid gland, trachea, and esophagus demonstrate no significant findings. Lungs/Pleura: Redemonstrated, widespread pulmonary and pleural nodularity, pleural disease more extensive on the left, which appears slightly increased compared to prior examination dated 02/23/2021. There is similar dense consolidation and atelectasis of the inferior left lower lobe and lingula with a small left pleural effusion. Upper Abdomen: No acute abnormality. Multiple hypodense hepatic metastases and multiple peritoneal nodules, not appreciably changed. Musculoskeletal: No chest wall abnormality. Unchanged lytic metastasis of the T10 vertebral body (series 6, image 113). IMPRESSION: 1. Redemonstrated, widespread bilateral pulmonary and pleural metastatic disease, which appears slightly worsened compared to prior examination dated 02/23/2021. 2. There is similar dense consolidation and atelectasis of the inferior left lower lobe and lingula with a small left pleural effusion. This is worrisome for infection or aspiration. 3. Additional hepatic, peritoneal, and osseous metastatic disease, not obviously changed as partially imaged. Electronically Signed   By: Delanna Ahmadi M.D.   On: 03/09/2021 13:17    CT Angio Chest PE W and/or Wo Contrast  Addendum Date: 02/24/2021   ADDENDUM REPORT: 02/24/2021 07:47 ADDENDUM: As stated in the original dictation, left-sided pleural metastasis, widespread pulmonary metastases and hepatic metastases all appear increased compared to prior examinations. In addition, there is consolidative airspace disease in the left lower lobe, concerning for pneumonia. Electronically Signed   By: Vinnie Langton M.D.   On: 02/24/2021 07:47   Result Date: 02/24/2021 CLINICAL DATA:  Pulmonary embolism suspected. High probability. Metastatic breast cancer. Shortness of breath. EXAM: CT ANGIOGRAPHY CHEST WITH CONTRAST TECHNIQUE: Multidetector CT imaging of the chest was performed using the standard protocol during bolus administration of intravenous contrast. Multiplanar CT image reconstructions and MIPs were obtained to evaluate the vascular anatomy. RADIATION DOSE REDUCTION: This exam was performed according to the departmental dose-optimization program which includes automated exposure control, adjustment of the mA and/or kV according to patient size and/or use of iterative reconstruction technique. CONTRAST:  145mL OMNIPAQUE IOHEXOL 350 MG/ML SOLN COMPARISON:  Chest radiography same day.  Chest CT 01/27/2021. FINDINGS: Cardiovascular: Cardiomegaly with left ventricular prominence. No visible aortic atherosclerotic calcification. Pulmonary arterial opacification is good. There are no pulmonary emboli. Mediastinum/Nodes: Over the last month, there has been considerable growth and mediastinal tumor. Anterior mediastinal disease previously measured at 15 and 17 mm on axial image 32 now measures 17 and 20 mm. Increased tumor growth in the subcarinal region and left hilum and along the left pericardial margin. Lungs/Pleura: Marked worsening of pulmonary metastatic disease. Innumerable nodules scattered throughout the lungs measuring 9 mm in size and smaller. There is worsening collapse of the  left lower lobe. Lobular pleural disease on the left has worsened. Upper Abdomen: Worsening of hepatic metastatic disease. More numerous and larger metastatic lesions. For example, metastasis at the dome of the liver on the right previously measured 18 mm in diameter and now measures 21 mm in diameter. Musculoskeletal: Lytic sclerotic metastatic disease throughout the spine and ribs appears similar. Review of the MIP images confirms the above findings. IMPRESSION: No pulmonary emboli. Progression of metastatic tumor within the mediastinum, left hilum, manifest as nodular metastatic lesions throughout both lungs, and within the liver. Worsened volume loss in the left lower lobe with progression of pleural disease. This interpretation is rendered on an emergent basis in order to rule out pulmonary emboli. The exam will be reviewed and addended by body/oncologic specialist radiologist tomorrow. Electronically Signed: By: Jan Fireman.D.  On: 02/23/2021 21:42   CARDIAC CATHETERIZATION  Result Date: 02/25/2021   The left ventricular systolic function is normal.   LV end diastolic pressure is normal.   The left ventricular ejection fraction is 55-65% by visual estimate. Conclusion Normal left ventricular function EF of 60% Normal coronaries Intervention deferred No conclusion   DG Chest Port 1 View  Result Date: 02/24/2021 CLINICAL DATA:  Shortness of breath, chest pain EXAM: PORTABLE CHEST 1 VIEW COMPARISON:  Previous studies including the examination of 02/23/2021 FINDINGS: Transverse diameter of heart is increased. There is opacification of left lower lung fields consistent with pleural effusion and possibly underlying infiltrate with no significant change. Are possible small nodular densities in the right mid and right lower lung fields. Right lateral CP angle is clear. There is no pneumothorax. IMPRESSION: No significant interval changes are noted in the opacification of left lower lung fields suggesting  pleural effusion and possibly underlying infiltrate. Electronically Signed   By: Elmer Picker M.D.   On: 02/24/2021 15:06   DG Chest Portable 1 View  Result Date: 02/23/2021 CLINICAL DATA:  Chest pain and shortness of breath. History of metastatic breast cancer. EXAM: PORTABLE CHEST 1 VIEW COMPARISON:  02/21/2021 and prior radiograph FINDINGS: The cardiomediastinal silhouette is unchanged. LEFT LOWER lung consolidation/atelectasis and LEFT pleural thickening again noted. No new pulmonary findings are noted. There is no evidence of pneumothorax or acute bony abnormality. IMPRESSION: Unchanged appearance of the chest with LEFT LOWER lung consolidation/atelectasis and LEFT pleural thickening. Electronically Signed   By: Margarette Canada M.D.   On: 02/23/2021 20:42   [place] EKG SITE RITE  Result Date: 03/10/2021 If Site Rite image not attached, placement could not be confirmed due to current cardiac rhythm.   SURGICAL PATHOLOGY     Reason for Addendum #1:  Breast Biomarker Results  Reason for Addendum #2:  Breast Biomarker Results   Clinical History: pleural effusion (cm)      FINAL MICROSCOPIC DIAGNOSIS:   A. PLEURA PEEL, LEFT:  - Metastatic adenocarcinoma.   COMMENT:   Immunohistochemistry is positive for cytokeratin 7, GATA-3, and ER. The  cells are negative for cytokeratin 20, CD56, synaptophysin,  chromogranin, TTF-1, NapsinA, p40 and cytokeratin 5/6. The findings are  consistent with metastatic mammary carcinoma. Prognostic markers will be  ordered.   INTRAOPERATIVE DIAGNOSIS:   A1. PLEURA PEEL, LEFT, FROZEN SECTION:         Non-small cell carcinoma.         Rapid intraoperative consult diagnosis rendered by Dr. Melina Copa @  0920 07/29/2020.    GROSS DESCRIPTION:   Received fresh for rapid intraoperative consult evaluation by frozen  section are multiple irregular pieces of pink-white to pale yellow soft  to indurated and vaguely nodular tissues, 2.9 x 2.4 x 0.5 cm in   aggregate.  Representative sections are submitted in block 1 for frozen  section, with remaining specimen submitted blocks 2, 3 for routine  histology.   SW 07/29/2020    Final Diagnosis performed by Vicente Males, MD.   Electronically signed  07/31/2020  Technical component performed at [facility]. [facility], [place] [address] [place], [place] [address].   Professional component performed at [facility],  [place] [address], [place], [place] [address].   Immunohistochemistry Technical component (if applicable) was performed  at [facility]. [address], [place],  [place], [place] [address].   IMMUNOHISTOCHEMISTRY DISCLAIMER (if applicable):  Some of these immunohistochemical stains may have  been developed and the  performance characteristics determine by [facility]. Some  may not have been cleared or approved by the U.S. Food and Drug  Administration. The FDA has determined that such clearance or approval  is not necessary. This test is used for clinical purposes. It should not  be regarded as investigational or for research. This laboratory is  certified under the [place]  (CLIA-88) as qualified to perform high complexity clinical laboratory  testing.  The controls stained appropriately.   ADDENDUM:   PROGNOSTIC INDICATOR RESULTS:   Immunohistochemical and morphometric analysis performed manually   The tumor cells are EQUIVOCAL for Her2 (2+) with a heterogeneous  pattern. The most intense areas have been selected for Her2 FISH and  results will be reported in an addendum.   Estrogen Receptor:       POSITIVE, 80%, MODERATE TO STRONG STAINING  Progesterone Receptor:   POSITIVE, 25%, STRONG STAINING   Reference Range Estrogen and Progesterone Receptor       Negative  0%       Positive  >1%   All controls stained appropriately.         ASSESSMENT/PLAN     Acute on  chronic hypoxemic respiratory failure - present on admission  - COVID19 negative - supplemental O2 during my evaluation 6 L/min nasal cannula - will perform infectious workup for pneumonia -Respiratory viral panel -serum fungitell -legionella ab -strep pneumoniae ur AG -Histoplasma Ur Ag -sputum resp cultures -AFB sputum expectorated specimen -sputum cytology  -reviewed pertinent imaging with patient today -CRP -please encourage patient to use incentive spirometer few times each hour while hospitalized.   -MetaNeb therapy for recruitment of left lower lobe consolidated infiltrate -Agree with Rocephin and Zithromax status post cefepime and vancomycin   Metastatic breast cancer -Patient is being followed by palliative care with hematology oncology service -Prognosis very poor at this point which is very unfortunate for this young patient -patient reports analgesia is inadequate and shares she awakes from sleep due to pain and has to ask for pain medication which takes a while after request.  I will schedule her dilaudid at current $RemoveBe'1mg'[id number]$  dose so she is not suffering from cancer associated pain     Left lower lobe atelectasis Continue albuterol nebulizer as well as MetaNeb therapy 3 times daily with respiratory therapist   Hypercalcemia of malignancy    -IV fluids and furosemide daily    -No severe EKG changes noted QRS slightly reduced to 90 MS   -nephrology consultation for additional evaluation management      Patient has severe multiple life-threatening active medical conditions which are evaluated and managed during this patient visit.  Thank you for allowing me to participate in the care of this patient.   This document was prepared using Dragon voice recognition software and may include unintentional dictation errors.     Ottie Glazier, M.D.  Division of [facility]

## 2021-03-12 NOTE — Progress Notes (Signed)
PROGRESS NOTE    Kaylee Romero  PQZ:[id number] DOB: 17-Mar-1980 DOA: 03/09/2021 PCP: Dion Body, MD    Brief Narrative:  41 y.o. 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  Patient states she completed a course of Levaquin and her symptoms had improved until 1 day prior to her admission when she developed worsening nausea and vomiting.  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 She has a cough that is nonproductive but denies having any fever or chills.  Hospital course has been focused on her pain control.  Patient requires increasingly high doses of narcotic and nonnarcotic pain medication with seemingly short-lived effect.  She states that the only medication that does provide relief is IV hydromorphone which cannot be done outside of the hospital setting.  Pulmonology, oncology, palliative care all involved in care.  Hypercalcemia has resolved.  Case discussed with nephrology.  They have signed off as of 3/7.   Assessment & Plan:   Principal Problem:   HCAP (healthcare-associated pneumonia) Active Problems:   Cancer-related pain   Hypercalcemia   ADD (attention deficit disorder)   Malignant neoplasm of upper-outer quadrant of right breast in female, estrogen receptor positive ([place])   Essential hypertension   Depression   Palliative care encounter  * HCAP (healthcare-associated pneumonia) --Recurrent left lower lobe pneumonia vs delayed imaging resolution of previous treated PNA. --pulm consulted, rec broadened to  zosyn Plan: --cont zosyn --Continue azithromycin     Cancer-related pain Secondary to known widespread metastatic disease Plan: -- Multimodal pain regimen.  Oncology and palliative care both following.  Per palliative care patient did respond best to IV Toradol.  This has been initiated.  Will continue to discuss pain control with patient and palliative care and determine the most appropriate course of action.       Hypercalcemia --nephrology consulted --Zoledronic acid 4 mg administered. --Hypercalcemia resolved, nephrology signed off 3/7 Plan: --decrease NS to 75 ml/hr, per nephro rec -- No Lasix while on fluids     Depression Stable Continue venlafaxine   Essential hypertension Blood pressure stable Continue metoprolol   Malignant neoplasm of upper-outer quadrant of right breast in female, estrogen receptor positive ([place]) Patient with a known history of stage IV ER/PR positive, HER2 negative invasive carcinoma of the breast with widespread metastatic disease. Continue Xeloda --oncology consulted --oncology palliative care consulted --pain management, per oncology   ADD (attention deficit disorder) Stable --resume home Adderall  DVT prophylaxis: SQ Lovenox Code Status: Full code Family Communication: None today Disposition Plan: Status is: Inpatient Remains inpatient appropriate because: Intractable pain in setting of metastatic breast cancer   Level of care: Med-Surg  Consultants:  Oncology Pulmonology Palliative care  Procedures:  None  Antimicrobials: Zosyn Azithromycin   Subjective: Patient seen and examined.  Endorses significant pain greater on the left side.  Endorses intermittent response to not IV pain medication  Objective: Vitals:   03/12/21 0425 03/12/21 0745 03/12/21 0836 03/12/21 1534  BP: (!) 166/88 (!) 163/88  (!) 144/68  Pulse: 95 98  (!) 105  Resp:      Temp: 98.8  F (37.1 C) 98.9 F (37.2 C)  97.8 F (36.6 C)  TempSrc:   Oral  Oral  SpO2: 96% 96% 99% 93%  Weight:      Height:        Intake/Output Summary (Last 24 hours) at 03/12/2021 1613 Last data filed at 03/12/2021 1129 Gross per 24 hour  Intake 3348.11 ml  Output --  Net 3348.11 ml   Filed Weights   03/09/21 0950  Weight: 77 kg    Examination:  General exam: Appears calm and comfortable  Respiratory system: Clear to auscultation. Respiratory effort normal. Cardiovascular system: S1 & S2 heard, RRR. No JVD, murmurs, rubs, gallops or clicks. No pedal edema. Gastrointestinal system: Abdomen is nondistended, soft and nontender. No organomegaly or masses felt. Normal bowel sounds heard. Central nervous system: Alert and oriented. No focal neurological deficits. Extremities: Symmetric 5 x 5 power. Skin: No rashes, lesions or ulcers Psychiatry: Judgement and insight appear normal. Mood & affect appropriate.     Data Reviewed: I have personally reviewed following labs and imaging studies  CBC: Recent Labs  Lab 03/09/21 1026 03/10/21 0815 03/11/21 0330 03/12/21 0430  WBC 14.4* 17.9* 19.0* 14.8*  NEUTROABS 13.0*  --   --   --   HGB 13.4 11.6* 11.5* 11.4*  HCT 39.8 34.1* 33.3* 33.4*  MCV 96.4 98.3 96.8 96.8  PLT 348 299 283 086   Basic Metabolic Panel: Recent Labs  Lab 03/09/21 1130 03/10/21 0815 03/11/21 0330 03/12/21 0430  NA 140 140 137 137  K 3.8 3.5 2.8* 3.6  CL 97* 104 97* 99  CO2 29 21* 27 27  GLUCOSE 125* 88 88 89  BUN _0 CREATININE 0.70 0.70 0.66 0.51  CALCIUM 13.1* QUANTITY NOT SUFFICIENT, UNABLE TO PERFORM TEST   10.7* 9.6 9.1  MG  --   --  1.9 1.9   GFR: Estimated Creatinine Clearance: 90 mL/min (by C-G formula based on SCr of 0.51 mg/dL). Liver Function Tests: Recent Labs  Lab 03/09/21 1130  AST 95*  ALT 26  ALKPHOS 78  BILITOT 0.5  PROT 7.2  ALBUMIN 3.5   Recent Labs  Lab 03/09/21 1130  LIPASE 22   No results for input(s): AMMONIA in the last 168 hours. Coagulation Profile: No results  for input(s): INR, PROTIME in the last 168 hours. Cardiac Enzymes: No results for input(s): CKTOTAL, CKMB, CKMBINDEX, TROPONINI in the last 168 hours. BNP (last 3 results) No results for input(s): PROBNP in the last 8760 hours. HbA1C: No results for input(s): HGBA1C in the last 72 hours. CBG: No results for input(s): GLUCAP in the last 168 hours. Lipid Profile: No results for input(s): CHOL, HDL, LDLCALC, TRIG, CHOLHDL, LDLDIRECT in the last 72 hours. Thyroid Function Tests: No results for input(s): TSH, T4TOTAL, FREET4, T3FREE, THYROIDAB in the last 72 hours. Anemia Panel: No results for input(s): VITAMINB12, FOLATE, FERRITIN, TIBC, IRON, RETICCTPCT in the last 72 hours. Sepsis Labs: Recent Labs  Lab 03/09/21 1026 03/09/21 2209 03/11/21 0330  PROCALCITON  --   --  <0.10  LATICACIDVEN 2.3* 2.3*  --     Recent Results (from the past 240 hour(s))  Resp Panel by RT-PCR (Flu A&B, Covid) Nasopharyngeal Swab     Status: None   Collection Time: 03/09/21 10:26 AM   Specimen: Nasopharyngeal Swab; Nasopharyngeal(NP) swabs in vial transport medium  Result Value Ref Range Status   SARS Coronavirus 2 by RT PCR NEGATIVE NEGATIVE Final    Comment: (NOTE) SARS-CoV-2  target nucleic acids are NOT DETECTED.  The SARS-CoV-2 RNA is generally detectable in upper respiratory specimens during the acute phase of infection. The lowest concentration of SARS-CoV-2 viral copies this assay can detect is 138 copies/mL. A negative result does not preclude SARS-Cov-2 infection and should not be used as the sole basis for treatment or other patient management decisions. A negative result may occur with  improper specimen collection/handling, submission of specimen other than nasopharyngeal swab, presence of viral mutation(s) within the areas targeted by this assay, and inadequate number of viral copies(<138 copies/mL). A negative result must be combined with clinical observations, patient history, and  epidemiological information. The expected result is Negative.  Fact Sheet for Patients:  EntrepreneurPulse.com.au  Fact Sheet for Healthcare Providers:  IncredibleEmployment.be  This test is no t yet approved or cleared by the [place] FDA and  has been authorized for detection and/or diagnosis of SARS-CoV-2 by FDA under an Emergency Use Authorization (EUA). This EUA will remain  in effect (meaning this test can be used) for the duration of the COVID-19 declaration under Section 564(b)(1) of the Act, 21 U.S.C.section 360bbb-3(b)(1), unless the authorization is terminated  or revoked sooner.       Influenza A by PCR NEGATIVE NEGATIVE Final   Influenza B by PCR NEGATIVE NEGATIVE Final    Comment: (NOTE) The Xpert Xpress SARS-CoV-2/FLU/RSV plus assay is intended as an aid in the diagnosis of influenza from Nasopharyngeal swab specimens and should not be used as a sole basis for treatment. Nasal washings and aspirates are unacceptable for Xpert Xpress SARS-CoV-2/FLU/RSV testing.  Fact Sheet for Patients: EntrepreneurPulse.com.au  Fact Sheet for Healthcare Providers: IncredibleEmployment.be  This test is not yet approved or cleared by the [place] FDA and has been authorized for detection and/or diagnosis of SARS-CoV-2 by FDA under an Emergency Use Authorization (EUA). This EUA will remain in effect (meaning this test can be used) for the duration of the COVID-19 declaration under Section 564(b)(1) of the Act, 21 U.S.C. section 360bbb-3(b)(1), unless the authorization is terminated or revoked.  Performed at [facility], [address] [place], [place] [address]   MRSA Next Gen by PCR, Nasal     Status: None   Collection Time: 03/09/21 10:09 PM   Specimen: Nasal Mucosa; Nasal Swab  Result Value Ref Range Status   MRSA by PCR Next Gen NOT DETECTED NOT DETECTED Final    Comment:  (NOTE) The GeneXpert MRSA Assay (FDA approved for NASAL specimens only), is one component of a comprehensive MRSA colonization surveillance program. It is not intended to diagnose MRSA infection nor to guide or monitor treatment for MRSA infections. Test performance is not FDA approved in patients less than 67 years old. Performed at [facility], [place], [place] [address]   Respiratory (~20 pathogens) panel by PCR     Status: None   Collection Time: 03/09/21 10:09 PM   Specimen: Nasal Mucosa; Respiratory  Result Value Ref Range Status   Adenovirus NOT DETECTED NOT DETECTED Final   Coronavirus 229E NOT DETECTED NOT DETECTED Final    Comment: (NOTE) The Coronavirus on the Respiratory Panel, DOES NOT test for the novel  Coronavirus (2019 nCoV)    Coronavirus HKU1 NOT DETECTED NOT DETECTED Final   Coronavirus NL63 NOT DETECTED NOT DETECTED Final   Coronavirus OC43 NOT DETECTED NOT DETECTED Final   Metapneumovirus NOT DETECTED NOT DETECTED Final   Rhinovirus / Enterovirus NOT DETECTED NOT DETECTED Final   Influenza  A NOT DETECTED NOT DETECTED Final   Influenza B NOT DETECTED NOT DETECTED Final   Parainfluenza Virus 1 NOT DETECTED NOT DETECTED Final   Parainfluenza Virus 2 NOT DETECTED NOT DETECTED Final   Parainfluenza Virus 3 NOT DETECTED NOT DETECTED Final   Parainfluenza Virus 4 NOT DETECTED NOT DETECTED Final   Respiratory Syncytial Virus NOT DETECTED NOT DETECTED Final   Bordetella pertussis NOT DETECTED NOT DETECTED Final   Bordetella Parapertussis NOT DETECTED NOT DETECTED Final   Chlamydophila pneumoniae NOT DETECTED NOT DETECTED Final   Mycoplasma pneumoniae NOT DETECTED NOT DETECTED Final    Comment: Performed at [facility], [place] [address] [place], [place] [address]  Acid Fast Smear (AFB)     Status: None   Collection Time: 03/10/21 11:30 AM   Specimen: Sputum  Result Value Ref Range Status   AFB Specimen Processing Concentration   Final    Comment: (NOTE) Performed At: [facility] [place], [place] [id number] Rush Farmer MD CW:[id number]    Acid Fast Smear QNSAFB  Final    Comment: (NOTE) Test not performed. AFB Smear not performed due to specimen source (blood) or insufficient specimen.    Source (AFB) BLOOD  Final    Comment: Performed at [facility], [address] [place], [place] [address]         Radiology Studies: No results found.      Scheduled Meds:  amphetamine-dextroamphetamine  30 mg Oral q morning   azithromycin  500 mg Oral Daily   Chlorhexidine Gluconate Cloth  6 each Topical Daily   dexamethasone  4 mg Oral Daily   enoxaparin (LOVENOX) injection  40 mg Subcutaneous Q24H   feeding supplement  237 mL Oral TID BM   furosemide  40 mg Intravenous Daily   metoprolol tartrate  12.5 mg Oral BID   morphine  15 mg Oral Q8H   morphine  30 mg Oral Q8H   polyethylene glycol  17 g Oral BID   senna  2 tablet Oral BID   sodium chloride flush  10-40 mL Intracatheter Q12H   venlafaxine XR  150 mg Oral Q breakfast   Continuous Infusions:  sodium chloride 75 mL/hr at 03/12/21 1129   piperacillin-tazobactam (ZOSYN)  IV 3.375 g (03/12/21 1439)     LOS: 3 days      Sidney Ace, MD [facility]   If 7PM-7AM, please contact night-coverage  03/12/2021, 4:13 PM

## 2021-03-12 NOTE — Progress Notes (Signed)
[place] at [facility] Telephone:([phone]) [phone] [phone]   Name: TNIA ANGLADA Date: 03/12/2021 MRN: [id number]  DOB: 10-08-1980  Patient Care Team: Dion Body, MD as PCP - General (Family Medicine) Gery Pray, MD as Consulting Physician (Radiation Oncology) Jovita Kussmaul, MD as Consulting Physician (General Surgery) Dillingham, Loel Lofty, DO as Attending Physician (Plastic Surgery) Lloyd Huger, MD as Consulting Physician (Oncology)    REASON FOR CONSULTATION: EULIA HATCHER is a 41 y.o. 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  Patient then developed recurrence on tamoxifen.  Patient developed worsening shortness of breath and was found to have recurrence in July 2022 with malignant pleural effusion ultimately requiring VATS procedure and Pleurx.  Patient is status post BSO on 02/17/2021. Patient has had second and third opinions from [facility] and [place].  She is on third line chemotherapy for breast cancer.  She is status post XRT to the spine/ribs.  Patient was admitted to the hospital on 03/09/2021 with hypercalcemia.  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   Malignant neoplasm of upper-outer quadrant of right breast in female, estrogen receptor positive [facility])    oncologist--- dr Grayland Ormond;;  first dx 09/ 2020 s/p right lumpectomy w/ node dissection and completed chemoradiation 08/ 2021;;  recurrent 07/ 2022 to lung pleura,liver, and thoracic spine   SOB (shortness of breath)    02-12-2021  pt denies with regular activity but sob with stairs    PAST SURGICAL HISTORY:  Past Surgical History:  Procedure Laterality Date   ABDOMINAL HYSTERECTOMY N/A 02/17/2021   BREAST LUMPECTOMY WITH AXILLARY LYMPH NODE BIOPSY Right 11/21/2018   Procedure: RIGHT BREAST LUMPECTOMY WITH SENTINEL LYMPH NODE BIOPSY;  Surgeon: Jovita Kussmaul, MD;  Location: [place];  Service: General;  Laterality: Right;   BREAST REDUCTION SURGERY Bilateral 11/21/2018   Procedure: BILATERAL MAMMARY REDUCTION  (BREAST);  Surgeon: Wallace Going, DO;  Location: [place];  Service: Plastics;  Laterality: Bilateral;   CHEST TUBE INSERTION Left 07/29/2020   Procedure: INSERTION PLEURAL DRAINAGE CATHETER;  Surgeon: Lajuana Matte, MD;  Location: [place];  Service: Thoracic;  Laterality: Left;   IRRIGATION AND DEBRIDEMENT STERNOCLAVICULAR JOINT-STERNUM AND RIBS N/A 08/13/2020   Procedure: IRRIGATION AND DEBRIDEMENT CHEST WALL ABSCESS;  Surgeon: Lajuana Matte, MD;  Location: [place] [facility];  Service: Cardiothoracic;  Laterality: N/A;   IUD REMOVAL N/A 02/17/2021   Procedure: INTRAUTERINE DEVICE (IUD) REMOVAL;  Surgeon: Linda Hedges, DO;  Location: [place];  Service: Gynecology;  Laterality: N/A;   LAPAROSCOPIC SALPINGO OOPHERECTOMY Bilateral 02/17/2021   Procedure: LAPAROSCOPIC BILATERAL SALPINGO OOPHORECTOMY;  Surgeon: Linda Hedges, DO;  Location: [facility];  Service: Gynecology;  Laterality: Bilateral;   LEFT HEART CATH AND CORONARY ANGIOGRAPHY N/A 02/25/2021   Procedure: LEFT HEART CATH AND  CORONARY ANGIOGRAPHY possible PCI and stent;  Surgeon: Yolonda Kida, MD;  Location: [place] [facility];  Service: Cardiovascular;  Laterality: N/A;   PLEURAL BIOPSY Left 07/29/2020   Procedure: PLEURAL BIOPSY;  Surgeon: Lajuana Matte, MD;  Location: [place];  Service: Thoracic;  Laterality: Left;   PLEURAL EFFUSION DRAINAGE Left 07/29/2020   Procedure: DRAINAGE OF PLEURAL EFFUSION;  Surgeon: Lajuana Matte, MD;  Location: [place];  Service: Thoracic;  Laterality: Left;   PORT-A-CATH REMOVAL N/A 07/06/2019   Procedure: REMOVAL PORT-A-CATH;  Surgeon: Jovita Kussmaul, MD;  Location: [place];  Service: General;  Laterality: N/A;   PORTACATH PLACEMENT N/A 11/21/2018   Procedure: INSERTION LEFT PORT-A-CATH WITH ULTRASOUND GUIDANCE;  Surgeon: Jovita Kussmaul, MD;  Location: [place];  Service: General;  Laterality: N/A;   [place] Left 07/29/2020   Procedure: VIDEO ASSISTED THORACOSCOPY;  Surgeon: Lajuana Matte, MD;  Location: [place];  Service: Thoracic;  Laterality: Left;   WISDOM TOOTH EXTRACTION      HEMATOLOGY/ONCOLOGY HISTORY:  Oncology History  Malignant neoplasm of upper-outer quadrant of right breast in female, estrogen receptor positive ([place])  09/29/2018 Initial Diagnosis   Malignant neoplasm of upper-outer quadrant of right breast in female, estrogen receptor positive ([place])   09/2018 -  Anti-estrogen oral therapy   Tamoxifen; held from 12/05/2018-09/06/2019 for surgery   10/05/2018 Cancer Staging   Staging form: Breast, AJCC 8th Edition - Clinical stage from 10/05/2018: Stage IB (cT2, cN0, cM0, G2, ER+, PR+, HER2-) - Signed by Chauncey Cruel, MD on 04/02/2020 Stage prefix: Initial diagnosis Histologic grading system: 3 grade system   10/13/2018 Genetic Testing   Negative genetic testing. No pathogenic variants identified on the [facility] Breast Cancer STAT Panel + Common Hereditary Cancers Panel. The Common  Hereditary Cancers Panel offered by [facility] includes sequencing and/or deletion duplication testing of the following 47 genes: APC, ATM, AXIN2, BARD1, BMPR1A, BRCA1, BRCA2, BRIP1, CDH1, CDKN2A (p14ARF), CDKN2A (p16INK4a), CKD4, CHEK2, CTNNA1, DICER1, EPCAM (Deletion/duplication testing only), GREM1 (promoter region deletion/duplication testing only), KIT, MEN1, MLH1, MSH2, MSH3, MSH6, MUTYH, NBN, NF1, NHTL1, PALB2, PDGFRA, PMS2, POLD1, POLE, PTEN, RAD50, RAD51C, RAD51D, SDHB, SDHC, SDHD, SMAD4, SMARCA4. STK11, TP53, TSC1, TSC2, and VHL.  The following genes were evaluated for sequence changes only: SDHA and HOXB13 c.251G>A variant only. The report date is 10/13/2018.    11/21/2018 Surgery   Right lumpectomy [name]) [phone]): IDC, grade 2, 2.6 cm, with DCIS. Negative margins. 1/4 lymph nodes positive for macrometastasis. ER/PR positive, HER-2 negative.   12/26/2018 - 06/01/2019 Chemotherapy   dexamethasone (DECADRON) 4 MG tablet, 1 of 1 cycle, Start date: 12/05/2018, End date: 03/02/2019  DOXOrubicin (ADRIAMYCIN) chemo injection 114 mg, 60 mg/m2 = 114 mg, Intravenous,  Once, 4 of 4 cycles. Administration: 114 mg (01/05/2019), 114 mg (01/19/2019), 114 mg (02/02/2019), 114 mg (02/15/2019)  palonosetron (ALOXI) injection 0.25 mg, 0.25 mg, Intravenous,  Once, 1 of 1 cycle. Administration: 0.25 mg (01/05/2019)  pegfilgrastim (NEULASTA ONPRO KIT) injection 6 mg, 6 mg, Subcutaneous, Once, 2 of 2 cycles  pegfilgrastim-jmdb (FULPHILA) injection 6 mg, 6 mg, Subcutaneous,  Once, 4 of 4 cycles. Administration: 6 mg (01/07/2019), 6 mg (01/21/2019), 6 mg (02/06/2019), 6 mg (02/17/2019)  cyclophosphamide (CYTOXAN) 1,140 mg in sodium chloride 0.9 % 250 mL chemo infusion, 600 mg/m2 =  1,140 mg, Intravenous,  Once, 4 of 4 cycles. Administration: 1,140 mg (01/05/2019), 1,140 mg (01/19/2019), 1,140 mg (02/02/2019), 1,140 mg (02/15/2019).  PACLitaxel (TAXOL) 150 mg in sodium chloride 0.9 % 250 mL chemo infusion (</= 56m/m2), 80  mg/m2 = 150 mg, Intravenous,  Once, 12 of 12 cycles. Administration: 150 mg (03/02/2019), 150 mg (03/09/2019), 150 mg (03/23/2019), 150 mg (03/30/2019), 150 mg (04/06/2019), 150 mg (04/20/2019), 150 mg (04/27/2019), 150 mg (05/03/2019), 150 mg (05/11/2019), 150 mg (05/18/2019), 150 mg (05/25/2019), 150 mg (06/01/2019)  fosaprepitant (EMEND) 150 mg  dexamethasone (DECADRON) 12 mg in sodium chloride 0.9 % 145 mL IVPB, , Intravenous,  Once, 4 of 4 cycles. Administration:  (01/05/2019),  (01/19/2019),  (02/02/2019),  (02/15/2019).   06/21/2019 - 08/11/2019 Radiation Therapy   The patient initially received a dose of 50.4 Gy in 28 fractions to the breast using whole-breast tangent fields. This was delivered using a 3-D conformal technique. The pt received a boost delivering an additional 16 Gy in 8 fractions using a electron boost with 148m electrons. The total dose was 66.4 Gy.    11/12/2019 Cancer Staging   Staging form: Breast, AJCC 8th Edition - Pathologic: Stage IB (pT2, pN1a, cM0, G2, ER+, PR+, HER2-)     Oncotype testing   MammaPrint high risk     ALLERGIES:  is allergic to betadine [povidone-iodine].  MEDICATIONS:  Current Facility-Administered Medications  Medication Dose Route Frequency Provider Last Rate Last Admin   0.9 %  sodium chloride infusion   Intravenous Continuous BrColon FlatteryNP 75 mL/hr at 03/12/21 1129 New Bag at 03/12/21 1129   acetaminophen (TYLENOL) tablet 1,000 mg  1,000 mg Oral Q6H PRN Agbata, Tochukwu, MD   1,000 mg at 03/10/21 [id number] ALPRAZolam (XANAX) tablet 0.25 mg  0.25 mg Oral BID PRN Agbata, Tochukwu, MD   0.25 mg at 03/11/21 1750   amphetamine-dextroamphetamine (ADDERALL XR) 24 hr capsule 30 mg  30 mg Oral q morning LaEnzo BiMD       azithromycin (ZSt. Luke'S Hospital At The Vintagetablet 500 mg  500 mg Oral Daily ChBenita GutterRPH   500 mg at 03/12/21 [id number] Chlorhexidine Gluconate Cloth 2 % PADS 6 each  6 each Topical Daily LaEnzo BiMD   6 each at 03/12/21 0908   dexamethasone  (DECADRON) tablet 4 mg  4 mg Oral Daily Sreenath, Sudheer B, MD   4 mg at 03/12/21 1225   enoxaparin (LOVENOX) injection 40 mg  40 mg Subcutaneous Q24H Agbata, Tochukwu, MD   40 mg at 03/11/21 2202   feeding supplement (ENSURE ENLIVE / ENSURE PLUS) liquid 237 mL  237 mL Oral TID BM LaEnzo BiMD   237 mL at 03/12/21 0908   furosemide (LASIX) injection 40 mg  40 mg Intravenous Daily AlOttie GlazierMD   40 mg at 03/12/21 0856   guaiFENesin (ROBITUSSIN) 100 MG/5ML liquid 5 mL  5 mL Oral Q4H PRN Agbata, Tochukwu, MD   5 mL at 03/12/21 0230   HYDROmorphone (DILAUDID) injection 0.5 mg  0.5 mg Intravenous Q3H PRN SrRalene Muskrat, MD   0.5 mg at 03/12/21 1224   HYDROmorphone (DILAUDID) tablet 2-4 mg  2-4 mg Oral Q4H PRN [place], JoKirt BoysNP   4 mg at 03/11/21 1930   ipratropium-albuterol (DUONEB) 0.5-2.5 (3) MG/3ML nebulizer solution 3 mL  3 mL Nebulization Q4H PRN Agbata, Tochukwu, MD   3 mL at 03/12/21 0833   ketorolac (TORADOL) 15 MG/ML injection 15-30 mg  15-30 mg Intravenous Q8H  PRN Dylan Ruotolo, Kirt Boys, NP   15 mg at 03/12/21 0925   metoprolol tartrate (LOPRESSOR) tablet 12.5 mg  12.5 mg Oral BID Ottie Glazier, MD   12.5 mg at 03/12/21 0905   morphine (MS CONTIN) 12 hr tablet 15 mg  15 mg Oral Q8H Malichi Palardy, Kirt Boys, NP   15 mg at 03/12/21 9798   morphine (MS CONTIN) 12 hr tablet 30 mg  30 mg Oral Q8H Nocholas Damaso, Kirt Boys, NP   30 mg at 03/12/21 0906   ondansetron (ZOFRAN) tablet 4 mg  4 mg Oral Q6H PRN Agbata, Tochukwu, MD       Or   ondansetron (ZOFRAN) injection 4 mg  4 mg Intravenous Q6H PRN Agbata, Tochukwu, MD   4 mg at 03/12/21 0902   piperacillin-tazobactam (ZOSYN) IVPB 3.375 g  3.375 g Intravenous Azzie Roup, MD 12.5 mL/hr at 03/12/21 0622 3.375 g at 03/12/21 0622   polyethylene glycol (MIRALAX / GLYCOLAX) packet 17 g  17 g Oral BID Lisabeth Mian, Kirt Boys, NP   17 g at 03/12/21 9211   prochlorperazine (COMPAZINE) injection 10 mg  10 mg Intravenous Q6H PRN Athena Masse, MD   10 mg at  03/10/21 1717   senna (SENOKOT) tablet 17.2 mg  2 tablet Oral BID Lucille Crichlow, Kirt Boys, NP   17.2 mg at 03/12/21 0905   sodium chloride flush (NS) 0.9 % injection 10-40 mL  10-40 mL Intracatheter Q12H Enzo Bi, MD   10 mL at 03/12/21 0908   sodium chloride flush (NS) 0.9 % injection 10-40 mL  10-40 mL Intracatheter PRN Enzo Bi, MD       venlafaxine XR (EFFEXOR-XR) 24 hr capsule 150 mg  150 mg Oral Q breakfast Enzo Bi, MD   150 mg at 03/12/21 0906    VITAL SIGNS: BP (!) 163/88 (BP Location: Right Leg)    Pulse 98    Temp 98.9 F (37.2 C) (Oral)    Resp 17    Ht _0  (1.6 m)    Wt 169 lb 12.1 oz (77 kg)    LMP 01/06/2019 (Approximate)    SpO2 99%    BMI 30.07 kg/m  Filed Weights   03/09/21 0950  Weight: 169 lb 12.1 oz (77 kg)    Estimated body mass index is 30.07 kg/m as calculated from the following:   Height as of this encounter: _1  (1.6 m).   Weight as of this encounter: 169 lb 12.1 oz (77 kg).  LABS: CBC:    Component Value Date/Time   WBC 14.8 (H) 03/12/2021 0430   HGB 11.4 (L) 03/12/2021 0430   HGB 13.1 06/01/2019 1340   HCT 33.4 (L) 03/12/2021 0430   PLT 273 03/12/2021 0430   PLT 322 06/01/2019 1340   MCV 96.8 03/12/2021 0430   NEUTROABS 13.0 (H) 03/09/2021 1026   LYMPHSABS 0.7 03/09/2021 1026   MONOABS 0.5 03/09/2021 1026   EOSABS 0.0 03/09/2021 1026   BASOSABS 0.0 03/09/2021 1026   Comprehensive Metabolic Panel:    Component Value Date/Time   NA 137 03/12/2021 0430   K 3.6 03/12/2021 0430   CL 99 03/12/2021 0430   CO2 27 03/12/2021 0430   BUN 14 03/12/2021 0430   CREATININE 0.51 03/12/2021 0430   CREATININE 0.59 06/01/2019 1340   GLUCOSE 89 03/12/2021 0430   CALCIUM 9.1 03/12/2021 0430   CALCIUM 10.7 (H) 03/10/2021 0815   AST 95 (H) 03/09/2021 1130   AST 21 06/01/2019 1340   ALT  26 03/09/2021 1130   ALT 26 06/01/2019 1340   ALKPHOS 78 03/09/2021 1130   BILITOT 0.5 03/09/2021 1130   BILITOT 0.5 06/01/2019 1340   PROT 7.2 03/09/2021 1130   ALBUMIN  3.5 03/09/2021 1130    RADIOGRAPHIC STUDIES: DG Chest 2 View  Result Date: 03/09/2021 CLINICAL DATA:  Shortness of breath EXAM: CHEST - 2 VIEW COMPARISON:  Chest radiograph dated February 24, 2021 and CT examination dated February 23, 2021 FINDINGS: The heart is enlarged. Left basilar opacity which may represent atelectasis or infiltrate, unchanged. Multiple bilateral nodular opacities consistent with patient's known metastatic disease is unchanged. No appreciable pneumothorax. No acute osseous abnormality. IMPRESSION: 1.  Stable cardiomegaly. 2. Left basilar opacity which may represent atelectasis, infiltrate or small effusion, unchanged. 3. Numerous bilateral nodular pulmonary opacities consistent with metastatic disease. Electronically Signed   By: Keane Police D.O.   On: 03/09/2021 11:06   DG Chest 2 View  Result Date: 02/21/2021 CLINICAL DATA:  A 41 year old female presents with shortness of breath and history of breast cancer. EXAM: CHEST - 2 VIEW COMPARISON:  Chest x-ray from October 01, 2020 and CT of the chest from January 27, 2021. FINDINGS: Cardiomediastinal contours and hilar structures are stable. LEFT hemidiaphragm remains obscured. Signs of LEFT pleural effusion and basilar airspace process. Subtle background nodularity has increased in the RIGHT chest over time. No additional areas of frank consolidative changes. On limited assessment there is no acute skeletal process. Evidence of prior RIGHT axillary dissection presumably projecting over the RIGHT chest. IMPRESSION: 1. Malignant effusion in the LEFT chest with further consolidation or enlarging nodules and masses in this area. Difficult to exclude developing infection in the LEFT lung base. 2. Scattered small nodular densities in the RIGHT chest appearing more conspicuous than on previous imaging may represent a combination of known metastatic disease and superimposed infection. Electronically Signed   By: Zetta Bills M.D.   On:  02/21/2021 13:22   CT CHEST WO CONTRAST  Result Date: 03/09/2021 CLINICAL DATA:  Pneumonia, shortness of breath EXAM: CT CHEST WITHOUT CONTRAST TECHNIQUE: Multidetector CT imaging of the chest was performed following the standard protocol without IV contrast. RADIATION DOSE REDUCTION: This exam was performed according to the departmental dose-optimization program which includes automated exposure control, adjustment of the mA and/or kV according to patient size and/or use of iterative reconstruction technique. COMPARISON:  CT chest angiogram, 02/23/2021, CT chest abdomen pelvis, 01/27/2021 FINDINGS: Cardiovascular: No significant vascular findings. Normal heart size. No pericardial effusion. Mediastinum/Nodes: No enlarged mediastinal, hilar, or axillary lymph nodes. Thyroid gland, trachea, and esophagus demonstrate no significant findings. Lungs/Pleura: Redemonstrated, widespread pulmonary and pleural nodularity, pleural disease more extensive on the left, which appears slightly increased compared to prior examination dated 02/23/2021. There is similar dense consolidation and atelectasis of the inferior left lower lobe and lingula with a small left pleural effusion. Upper Abdomen: No acute abnormality. Multiple hypodense hepatic metastases and multiple peritoneal nodules, not appreciably changed. Musculoskeletal: No chest wall abnormality. Unchanged lytic metastasis of the T10 vertebral body (series 6, image 113). IMPRESSION: 1. Redemonstrated, widespread bilateral pulmonary and pleural metastatic disease, which appears slightly worsened compared to prior examination dated 02/23/2021. 2. There is similar dense consolidation and atelectasis of the inferior left lower lobe and lingula with a small left pleural effusion. This is worrisome for infection or aspiration. 3. Additional hepatic, peritoneal, and osseous metastatic disease, not obviously changed as partially imaged. Electronically Signed   By: Jamse Mead.D.  On: 03/09/2021 13:17   CT Angio Chest PE W and/or Wo Contrast  Addendum Date: 02/24/2021   ADDENDUM REPORT: 02/24/2021 07:47 ADDENDUM: As stated in the original dictation, left-sided pleural metastasis, widespread pulmonary metastases and hepatic metastases all appear increased compared to prior examinations. In addition, there is consolidative airspace disease in the left lower lobe, concerning for pneumonia. Electronically Signed   By: Vinnie Langton M.D.   On: 02/24/2021 07:47   Result Date: 02/24/2021 CLINICAL DATA:  Pulmonary embolism suspected. High probability. Metastatic breast cancer. Shortness of breath. EXAM: CT ANGIOGRAPHY CHEST WITH CONTRAST TECHNIQUE: Multidetector CT imaging of the chest was performed using the standard protocol during bolus administration of intravenous contrast. Multiplanar CT image reconstructions and MIPs were obtained to evaluate the vascular anatomy. RADIATION DOSE REDUCTION: This exam was performed according to the departmental dose-optimization program which includes automated exposure control, adjustment of the mA and/or kV according to patient size and/or use of iterative reconstruction technique. CONTRAST:  110m OMNIPAQUE IOHEXOL 350 MG/ML SOLN COMPARISON:  Chest radiography same day.  Chest CT 01/27/2021. FINDINGS: Cardiovascular: Cardiomegaly with left ventricular prominence. No visible aortic atherosclerotic calcification. Pulmonary arterial opacification is good. There are no pulmonary emboli. Mediastinum/Nodes: Over the last month, there has been considerable growth and mediastinal tumor. Anterior mediastinal disease previously measured at 15 and 17 mm on axial image 32 now measures 17 and 20 mm. Increased tumor growth in the subcarinal region and left hilum and along the left pericardial margin. Lungs/Pleura: Marked worsening of pulmonary metastatic disease. Innumerable nodules scattered throughout the lungs measuring 9 mm in size and smaller. There is  worsening collapse of the left lower lobe. Lobular pleural disease on the left has worsened. Upper Abdomen: Worsening of hepatic metastatic disease. More numerous and larger metastatic lesions. For example, metastasis at the dome of the liver on the right previously measured 18 mm in diameter and now measures 21 mm in diameter. Musculoskeletal: Lytic sclerotic metastatic disease throughout the spine and ribs appears similar. Review of the MIP images confirms the above findings. IMPRESSION: No pulmonary emboli. Progression of metastatic tumor within the mediastinum, left hilum, manifest as nodular metastatic lesions throughout both lungs, and within the liver. Worsened volume loss in the left lower lobe with progression of pleural disease. This interpretation is rendered on an emergent basis in order to rule out pulmonary emboli. The exam will be reviewed and addended by body/oncologic specialist radiologist tomorrow. Electronically Signed: By: MNelson ChimesM.D. On: 02/23/2021 21:42   CARDIAC CATHETERIZATION  Result Date: 02/25/2021   The left ventricular systolic function is normal.   LV end diastolic pressure is normal.   The left ventricular ejection fraction is 55-65% by visual estimate. Conclusion Normal left ventricular function EF of 60% Normal coronaries Intervention deferred No conclusion   DG Chest Port 1 View  Result Date: 02/24/2021 CLINICAL DATA:  Shortness of breath, chest pain EXAM: PORTABLE CHEST 1 VIEW COMPARISON:  Previous studies including the examination of 02/23/2021 FINDINGS: Transverse diameter of heart is increased. There is opacification of left lower lung fields consistent with pleural effusion and possibly underlying infiltrate with no significant change. Are possible small nodular densities in the right mid and right lower lung fields. Right lateral CP angle is clear. There is no pneumothorax. IMPRESSION: No significant interval changes are noted in the opacification of left lower  lung fields suggesting pleural effusion and possibly underlying infiltrate. Electronically Signed   By: PElmer PickerM.D.   On: 02/24/2021 15:06  DG Chest Portable 1 View  Result Date: 02/23/2021 CLINICAL DATA:  Chest pain and shortness of breath. History of metastatic breast cancer. EXAM: PORTABLE CHEST 1 VIEW COMPARISON:  02/21/2021 and prior radiograph FINDINGS: The cardiomediastinal silhouette is unchanged. LEFT LOWER lung consolidation/atelectasis and LEFT pleural thickening again noted. No new pulmonary findings are noted. There is no evidence of pneumothorax or acute bony abnormality. IMPRESSION: Unchanged appearance of the chest with LEFT LOWER lung consolidation/atelectasis and LEFT pleural thickening. Electronically Signed   By: Margarette Canada M.D.   On: 02/23/2021 20:42   [place] EKG SITE RITE  Result Date: 03/10/2021 If Site Rite image not attached, placement could not be confirmed due to current cardiac rhythm.   PERFORMANCE STATUS (ECOG) : 1 - Symptomatic but completely ambulatory  Review of Systems Unless otherwise noted, a complete review of systems is negative.  Physical Exam General: NAD Pulmonary: Unlabored Extremities: no edema, no joint deformities Skin: no rashes Neurological: Weakness but otherwise nonfocal  IMPRESSION: Follow-up visit.    Patient reports that she had a "rough" night with pain but feels some improvement this morning.  She continues to feel that the IV hydromorphone is most effective.  Discussed with her the importance of maximizing oral regimen in anticipation of discharge.  I am hesitant to discontinue IV hydromorphone in case patient needs it as backup but would prefer for her to receive maximum dosing of oral hydromorphone.  MS Contin was increased yesterday and we can further liberalize this for long-acting control.  If needed, would next increase MS Contin to 60 mg every 8 hours.    Ketorolac was ordered but has not yet been given.  Patient  reports that the ketorolac was effective during her previous hospitalization.  I have reached out to Dr. Dossie Arbour who agreed to see patient following discharge from the hospital for consideration of interventional procedures.  Patient did have a bowel movement this morning.  Appetite remains poor.  PLAN: -Continue current scope of treatment -MS Contin 45 mg every 8 hours. -Increase hydromorphone p.o. 4 mg every 4 hours as needed -Hydromorphone IV as backup but would prefer to maximize oral medications in anticipation of discharge -Toradol 15-30 mg IV every 8 hours as needed -Continue MiraLAX/senna to twice daily  Case and plan discussed with Dr. Grayland Ormond   Time Total: 20 minutes  Visit consisted of counseling and education dealing with the complex and emotionally intense issues of symptom management and palliative care in the setting of serious and potentially life-threatening illness.Greater than 50%  of this time was spent counseling and coordinating care related to the above assessment and plan.  Signed by: Altha Harm, PhD, NP-C

## 2021-03-12 NOTE — TOC CM/SW Note (Signed)
?  Transition of Care (TOC) Screening Note ? ? ?Patient Details  ?Name: Kaylee Romero ?Date of Birth: 04-19-1980 ? ? ?Transition of Care (TOC) CM/SW Contact:    ?Candie Chroman, LCSW ?Phone Number: ?03/12/2021, 9:25 AM ? ? ? ?Transition of Care Department [facility]) has reviewed patient and no TOC needs have been identified at this time. We will continue to monitor patient advancement through interdisciplinary progression rounds. If new patient transition needs arise, please place a TOC consult. ? ? ?

## 2021-03-13 ENCOUNTER — Other Ambulatory Visit: Payer: Self-pay | Admitting: Hospice and Palliative Medicine

## 2021-03-13 LAB — BASIC METABOLIC PANEL
Anion gap: 10 (ref 5–15)
BUN: 11 mg/dL (ref 6–20)
CO2: 28 mmol/L (ref 22–32)
Calcium: 9.7 mg/dL (ref 8.9–10.3)
Chloride: 99 mmol/L (ref 98–111)
Creatinine, Ser: 0.58 mg/dL (ref 0.44–1.00)
GFR, Estimated: >60 mL/min (ref >60)
Glucose, Bld: 103 mg/dL — ABNORMAL HIGH (ref 70–99)
Potassium: 3.6 mmol/L (ref 3.5–5.1)
Sodium: 137 mmol/L (ref 135–145)

## 2021-03-13 LAB — MAGNESIUM: Magnesium: 1.8 mg/dL (ref 1.7–2.4)

## 2021-03-13 LAB — CBC
HCT: 31.1 % — ABNORMAL LOW (ref 36.0–46.0)
Hemoglobin: 10.7 g/dL — ABNORMAL LOW (ref 12.0–15.0)
MCH: 33.4 pg (ref 26.0–34.0)
MCHC: 34.4 g/dL (ref 30.0–36.0)
MCV: 97.2 fL (ref 80.0–100.0)
Platelets: 240 10*3/uL (ref 150–400)
RBC: 3.2 MIL/uL — ABNORMAL LOW (ref 3.87–5.11)
RDW: 14.6 % (ref 11.5–15.5)
WBC: 17.6 10*3/uL — ABNORMAL HIGH (ref 4.0–10.5)
nRBC: 0.2 % (ref 0.0–0.2)

## 2021-03-13 MED ORDER — AZITHROMYCIN 500 MG PO TABS: 500.0000 mg | ORAL_TABLET | Freq: Every day | ORAL | 0 refills | Status: AC

## 2021-03-13 MED ORDER — MORPHINE SULFATE ER 60 MG PO TBCR: 60.0000 mg | EXTENDED_RELEASE_TABLET | Freq: Three times a day (TID) | ORAL | 0 refills | Status: AC

## 2021-03-13 MED ORDER — FUROSEMIDE 20 MG PO TABS: 20.0000 mg | ORAL_TABLET | Freq: Every day | ORAL | 1 refills | Status: AC

## 2021-03-13 MED ORDER — MORPHINE SULFATE ER 15 MG PO TBCR
60.0000 mg | EXTENDED_RELEASE_TABLET | Freq: Three times a day (TID) | ORAL | Status: DC
  Administered 2021-03-13: 15:00:00 60 mg via ORAL
  Filled 2021-03-13: qty 4

## 2021-03-13 MED ORDER — HYDROMORPHONE HCL 4 MG PO TABS: 4.0000 mg | ORAL_TABLET | ORAL | 0 refills | Status: DC | PRN

## 2021-03-13 MED ORDER — HYDROMORPHONE HCL 1 MG/ML IJ SOLN
0.5000 mg | Freq: Once | INTRAMUSCULAR | Status: AC
  Administered 2021-03-13: 18:00:00 0.5 mg via INTRAVENOUS
  Filled 2021-03-13: qty 0.5

## 2021-03-13 MED ORDER — CLONAZEPAM 0.25 MG PO TBDP
0.2500 mg | ORAL_TABLET | Freq: Two times a day (BID) | ORAL | Status: DC | PRN
  Administered 2021-03-13: 15:00:00 0.25 mg via ORAL
  Filled 2021-03-13: qty 1

## 2021-03-13 MED ORDER — DOCUSATE SODIUM 100 MG PO CAPS: 100.0000 mg | ORAL_CAPSULE | Freq: Two times a day (BID) | ORAL | 0 refills | Status: AC | PRN

## 2021-03-13 MED ORDER — HYDROMORPHONE HCL 2 MG PO TABS
4.0000 mg | ORAL_TABLET | ORAL | Status: DC | PRN
  Administered 2021-03-13: 12:00:00 4 mg via ORAL
  Filled 2021-03-13: qty 2

## 2021-03-13 MED ORDER — HYDROMORPHONE HCL 1 MG/ML IJ SOLN: 0.5000 mg | Freq: Four times a day (QID) | INTRAMUSCULAR | Status: DC | PRN

## 2021-03-13 MED ORDER — CLONAZEPAM 0.5 MG PO TABS
0.2500 mg | ORAL_TABLET | Freq: Two times a day (BID) | ORAL | 0 refills | Status: AC | PRN
Start: 2021-03-13 — End: ?

## 2021-03-13 MED ORDER — DEXAMETHASONE 4 MG PO TABS: 4.0000 mg | ORAL_TABLET | Freq: Every day | ORAL | 0 refills | Status: AC

## 2021-03-13 NOTE — Progress Notes (Signed)
PULMONOLOGY         Date: 03/13/2021,   MRN# [id number] ZABRINA BROTHERTON [birth year]     AdmissionWeight: 77 kg                 CurrentWeight: 77 kg   Referring physician: Dr Francine Graven   CHIEF COMPLAINT:   Recurrent malignant pleural effusion   HISTORY OF PRESENT ILLNESS   This is a 41 year old unfortunate patient with history of breast cancer and multiple additional comorbid conditions, evaluated by me last year for dyspnea found to have pleural effusion.  Initial thoracentesis done June 2022 was negative for atypia, patient subsequently had surgical biopsy of area of PET avid abnormal pleural thickening showing PR and ER positive non-small cell CA. recently patient did develop left lower lobe opacifications which was deemed to be pneumonia status posttreatment with levofloxacin and subsequent transient improvement followed by worsening and readmission showing reaccumulation of left pleural effusion.  On arrival symptoms included cough and dyspnea patient was placed on empiric antimicrobials for community-acquired pneumonia with cefepime and vancomycin.  CT chest was performed which I independently reviewed showing innumerable metastatic implants bilaterally in the lungs as well as previously noted abnormal thickening of the pleura, left lower lobe consolidation with severe narrowing of the left lower lobe bronchioles.  Blood work including CMP shows significant hypercalcemia at 13.1. Pulmonary consultation placed for additional evaluation management.    03/10/21- patient is stable without overnight events. Analgesia regiment modified. Reviewed antibiotics with change to zosyn. S/p nephrology, med/onc, palliative and hospitalist evaluaton appeciate everyone involved in patients care.   03/11/21- patient with no acute events overnight. She is resting with mild pain at this point. She is being seen by nephrology and oncology.   03/12/21- Patient seen at bedside with brother of patient  present in room. We discussed pain level which is adequately controlled. Her breathing is not improved but stays normoxic on room air. We discussed outpatient follow up. She had TPC placed in the past with resultant infection and sepsis of pleural cavity.  03/13/21-patient is stable and would be appropriate to go home with outpatient follow up as needed. I discussed care plan with her.  I recommend to have 14days of zithromax therapy total (including what she had here inpatient).   She should have lasix $RemoveBe'20mg'svDUBrEJP$  daily to keep pleural effusion draining.    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   Malignant neoplasm of upper-outer quadrant of right breast in female, estrogen receptor positive [facility])    oncologist--- dr Grayland Ormond;;  first dx 09/ 2020 s/p right lumpectomy w/ node dissection and completed chemoradiation 08/ 2021;;  recurrent 07/ 2022 to lung pleura,liver, and thoracic spine   SOB (shortness of breath)    02-12-2021  pt denies with regular activity but sob with stairs     SURGICAL HISTORY   Past Surgical History:  Procedure Laterality Date   ABDOMINAL HYSTERECTOMY N/A 02/17/2021   BREAST LUMPECTOMY WITH AXILLARY LYMPH NODE BIOPSY Right 11/21/2018   Procedure: RIGHT BREAST LUMPECTOMY WITH  SENTINEL LYMPH NODE BIOPSY;  Surgeon: Jovita Kussmaul, MD;  Location: [place];  Service: General;  Laterality: Right;   BREAST REDUCTION SURGERY Bilateral 11/21/2018   Procedure: BILATERAL MAMMARY REDUCTION  (BREAST);  Surgeon: Wallace Going, DO;  Location: [place];  Service: Plastics;  Laterality: Bilateral;   CHEST TUBE  INSERTION Left 07/29/2020   Procedure: INSERTION PLEURAL DRAINAGE CATHETER;  Surgeon: Lajuana Matte, MD;  Location: [place];  Service: Thoracic;  Laterality: Left;   IRRIGATION AND DEBRIDEMENT STERNOCLAVICULAR JOINT-STERNUM AND RIBS N/A 08/13/2020   Procedure: IRRIGATION AND DEBRIDEMENT CHEST WALL ABSCESS;  Surgeon: Lajuana Matte, MD;  Location: [facility];  Service: Cardiothoracic;  Laterality: N/A;   IUD REMOVAL N/A 02/17/2021   Procedure: INTRAUTERINE DEVICE (IUD) REMOVAL;  Surgeon: Linda Hedges, DO;  Location: [facility];  Service: Gynecology;  Laterality: N/A;   LAPAROSCOPIC SALPINGO OOPHERECTOMY Bilateral 02/17/2021   Procedure: LAPAROSCOPIC BILATERAL SALPINGO OOPHORECTOMY;  Surgeon: Linda Hedges, DO;  Location: [facility];  Service: Gynecology;  Laterality: Bilateral;   LEFT HEART CATH AND CORONARY ANGIOGRAPHY N/A 02/25/2021   Procedure: LEFT HEART CATH AND CORONARY ANGIOGRAPHY possible PCI and stent;  Surgeon: Yolonda Kida, MD;  Location: [facility];  Service: Cardiovascular;  Laterality: N/A;   PLEURAL BIOPSY Left 07/29/2020   Procedure: PLEURAL BIOPSY;  Surgeon: Lajuana Matte, MD;  Location: [facility];  Service: Thoracic;  Laterality: Left;   PLEURAL EFFUSION DRAINAGE Left 07/29/2020   Procedure: DRAINAGE OF PLEURAL EFFUSION;  Surgeon: Lajuana Matte, MD;  Location: [facility];  Service: Thoracic;  Laterality: Left;   PORT-A-CATH REMOVAL N/A 07/06/2019   Procedure: REMOVAL PORT-A-CATH;  Surgeon: Jovita Kussmaul, MD;  Location: [facility];  Service: General;  Laterality: N/A;   PORTACATH PLACEMENT N/A 11/21/2018   Procedure: INSERTION LEFT PORT-A-CATH WITH ULTRASOUND GUIDANCE;  Surgeon: Jovita Kussmaul, MD;  Location: [facility];  Service: General;  Laterality: N/A;   TONSILLECTOMY  1987   VIDEO ASSISTED THORACOSCOPY Left 07/29/2020   Procedure: VIDEO ASSISTED THORACOSCOPY;  Surgeon: Lajuana Matte, MD;  Location:  [facility];  Service: Thoracic;  Laterality: Left;   WISDOM TOOTH EXTRACTION       FAMILY HISTORY   Family History  Problem Relation Age of Onset   Hypertension Father    Alcohol abuse Paternal Grandfather    Heart disease Paternal Grandfather    Alcohol abuse Maternal Grandfather    Heart disease Maternal Grandmother    Breast cancer Other      SOCIAL HISTORY   Social History   Tobacco Use   Smoking status: Former    Packs/day: 0.50    Years: 20.00    Pack years: 10.00    Types: Cigarettes    Start date: 01/13/2002    Quit date: 10/17/2016    Years since quitting: 4.4   Smokeless tobacco: Never  Vaping Use   Vaping Use: Never used  Substance Use Topics   Alcohol use: Yes    Comment: Socially   Drug use: Never     MEDICATIONS    Home Medication:     Current Medication:  Current Facility-Administered Medications:    0.9 %  sodium chloride infusion, , Intravenous, Continuous, Breeze, Shantelle, NP, Last Rate: 75 mL/hr at 03/12/21 1129, New Bag at 03/12/21 1129   acetaminophen (TYLENOL) tablet 1,000 mg, 1,000 mg, Oral, Q6H PRN, Agbata, Tochukwu, MD, 1,000 mg at 03/10/21 0829   amphetamine-dextroamphetamine (ADDERALL XR) 24 hr  capsule 30 mg, 30 mg, Oral, q morning, Enzo Bi, MD, 30 mg at 03/13/21 1001   azithromycin [facility]) tablet 500 mg, 500 mg, Oral, Daily, Benita Gutter, RPH, 500 mg at 03/13/21 3704   Chlorhexidine Gluconate Cloth 2 % PADS 6 each, 6 each, Topical, Daily, Enzo Bi, MD, 6 each at 03/13/21 0916   clonazePAM (KLONOPIN) disintegrating tablet 0.25 mg, 0.25 mg, Oral, BID PRN, Borders, Kirt Boys, NP   dexamethasone (DECADRON) tablet 4 mg, 4 mg, Oral, Daily, Sreenath, Sudheer B, MD, 4 mg at 03/13/21 0916   enoxaparin (LOVENOX) injection 40 mg, 40 mg, Subcutaneous, Q24H, Agbata, Tochukwu, MD, 40 mg at 03/12/21 2224   feeding supplement (ENSURE ENLIVE / ENSURE PLUS) liquid 237 mL, 237 mL, Oral, TID BM, Enzo Bi, MD, 237 mL at 03/13/21 0914    guaiFENesin (ROBITUSSIN) 100 MG/5ML liquid 5 mL, 5 mL, Oral, Q4H PRN, Agbata, Tochukwu, MD, 5 mL at 03/13/21 0914   HYDROmorphone (DILAUDID) tablet 4-8 mg, 4-8 mg, Oral, Q4H PRN, Borders, Kirt Boys, NP, 4 mg at 03/13/21 1217   ipratropium-albuterol (DUONEB) 0.5-2.5 (3) MG/3ML nebulizer solution 3 mL, 3 mL, Nebulization, Q4H PRN, Agbata, Tochukwu, MD, 3 mL at 03/13/21 1107   ketorolac (TORADOL) 15 MG/ML injection 15-30 mg, 15-30 mg, Intravenous, Q8H PRN, Borders, Kirt Boys, NP, 15 mg at 03/13/21 1217   metoprolol tartrate (LOPRESSOR) tablet 12.5 mg, 12.5 mg, Oral, BID, Lanney Gins, Gaylin Osoria, MD, 12.5 mg at 03/13/21 0916   morphine (MS CONTIN) 12 hr tablet 60 mg, 60 mg, Oral, Q8H, Borders, Joshua R, NP   ondansetron (ZOFRAN) tablet 4 mg, 4 mg, Oral, Q6H PRN **OR** ondansetron (ZOFRAN) injection 4 mg, 4 mg, Intravenous, Q6H PRN, Agbata, Tochukwu, MD, 4 mg at 03/12/21 0902   piperacillin-tazobactam (ZOSYN) IVPB 3.375 g, 3.375 g, Intravenous, Q8H, Enzo Bi, MD, Last Rate: 12.5 mL/hr at 03/13/21 0638, 3.375 g at 03/13/21 8889   polyethylene glycol (MIRALAX / GLYCOLAX) packet 17 g, 17 g, Oral, BID, Borders, Kirt Boys, NP, 17 g at 03/12/21 2219   prochlorperazine (COMPAZINE) injection 10 mg, 10 mg, Intravenous, Q6H PRN, Athena Masse, MD, 10 mg at 03/10/21 1717   senna (SENOKOT) tablet 17.2 mg, 2 tablet, Oral, BID, Borders, Kirt Boys, NP, 17.2 mg at 03/13/21 0915   sodium chloride flush (NS) 0.9 % injection 10-40 mL, 10-40 mL, Intracatheter, Q12H, Enzo Bi, MD, 10 mL at 03/13/21 0916   sodium chloride flush (NS) 0.9 % injection 10-40 mL, 10-40 mL, Intracatheter, PRN, Enzo Bi, MD   venlafaxine XR (EFFEXOR-XR) 24 hr capsule 150 mg, 150 mg, Oral, Q breakfast, Enzo Bi, MD, 150 mg at 03/13/21 0915    ALLERGIES   Betadine [povidone-iodine]     REVIEW OF SYSTEMS    Review of Systems:  Gen:  Denies  fever, sweats, chills weigh loss  HEENT: Denies blurred vision, double vision, ear pain, eye pain, hearing  loss, nose bleeds, sore throat Cardiac:  No dizziness, chest pain or heaviness, chest tightness,edema Resp:   Denies cough or sputum porduction, shortness of breath,wheezing, hemoptysis,  Gi: Denies swallowing difficulty, stomach pain, nausea or vomiting, diarrhea, constipation, bowel incontinence Gu:  Denies bladder incontinence, burning urine Ext:   Denies Joint pain, stiffness or swelling Skin: Denies  skin rash, easy bruising or bleeding or hives Endoc:  Denies polyuria, polydipsia , polyphagia or weight change Psych:   Denies depression, insomnia or hallucinations   Other:  All other systems negative   VS: BP (!) 167/78 (BP Location: Left  Leg)    Pulse (!) 110    Temp 98.5 F (36.9 C) (Oral)    Resp 16    Ht $R'5\' 3"'id$  (1.6 m)    Wt 77 kg    LMP 01/06/2019 (Approximate)    SpO2 94%    BMI 30.07 kg/m      PHYSICAL EXAM    GENERAL:NAD, no fevers, chills, no weakness no fatigue HEAD: Normocephalic, atraumatic.  EYES: Pupils equal, round, reactive to light. Extraocular muscles intact. No scleral icterus.  MOUTH: Moist mucosal membrane. Dentition intact. No abscess noted.  EAR, NOSE, THROAT: Clear without exudates. No external lesions.  NECK: Supple. No thyromegaly. No nodules. No JVD.  PULMONARY: Clear to auscultation with decreased air entry bilaterlly CARDIOVASCULAR: S1 and S2. Regular rate and rhythm. No murmurs, rubs, or gallops. No edema. Pedal pulses 2+ bilaterally.  GASTROINTESTINAL: Soft, nontender, nondistended. No masses. Positive bowel sounds. No hepatosplenomegaly.  MUSCULOSKELETAL: No swelling, clubbing, or edema. Range of motion full in all extremities.  NEUROLOGIC: Cranial nerves II through XII are intact. No gross focal neurological deficits. Sensation intact. Reflexes intact.  SKIN: No ulceration, lesions, rashes, or cyanosis. Skin warm and dry. Turgor intact.  PSYCHIATRIC: Mood, affect within normal limits. The patient is awake, alert and oriented x 3. Insight,  judgment intact.       IMAGING    DG Chest 2 View  Result Date: 03/09/2021 CLINICAL DATA:  Shortness of breath EXAM: CHEST - 2 VIEW COMPARISON:  Chest radiograph dated February 24, 2021 and CT examination dated February 23, 2021 FINDINGS: The heart is enlarged. Left basilar opacity which may represent atelectasis or infiltrate, unchanged. Multiple bilateral nodular opacities consistent with patient's known metastatic disease is unchanged. No appreciable pneumothorax. No acute osseous abnormality. IMPRESSION: 1.  Stable cardiomegaly. 2. Left basilar opacity which may represent atelectasis, infiltrate or small effusion, unchanged. 3. Numerous bilateral nodular pulmonary opacities consistent with metastatic disease. Electronically Signed   By: Keane Police D.O.   On: 03/09/2021 11:06   DG Chest 2 View  Result Date: 02/21/2021 CLINICAL DATA:  A 41 year old female presents with shortness of breath and history of breast cancer. EXAM: CHEST - 2 VIEW COMPARISON:  Chest x-ray from October 01, 2020 and CT of the chest from January 27, 2021. FINDINGS: Cardiomediastinal contours and hilar structures are stable. LEFT hemidiaphragm remains obscured. Signs of LEFT pleural effusion and basilar airspace process. Subtle background nodularity has increased in the RIGHT chest over time. No additional areas of frank consolidative changes. On limited assessment there is no acute skeletal process. Evidence of prior RIGHT axillary dissection presumably projecting over the RIGHT chest. IMPRESSION: 1. Malignant effusion in the LEFT chest with further consolidation or enlarging nodules and masses in this area. Difficult to exclude developing infection in the LEFT lung base. 2. Scattered small nodular densities in the RIGHT chest appearing more conspicuous than on previous imaging may represent a combination of known metastatic disease and superimposed infection. Electronically Signed   By: Zetta Bills M.D.   On: 02/21/2021  13:22   CT CHEST WO CONTRAST  Result Date: 03/09/2021 CLINICAL DATA:  Pneumonia, shortness of breath EXAM: CT CHEST WITHOUT CONTRAST TECHNIQUE: Multidetector CT imaging of the chest was performed following the standard protocol without IV contrast. RADIATION DOSE REDUCTION: This exam was performed according to the departmental dose-optimization program which includes automated exposure control, adjustment of the mA and/or kV according to patient size and/or use of iterative reconstruction technique. COMPARISON:  CT chest angiogram, 02/23/2021,  CT chest abdomen pelvis, 01/27/2021 FINDINGS: Cardiovascular: No significant vascular findings. Normal heart size. No pericardial effusion. Mediastinum/Nodes: No enlarged mediastinal, hilar, or axillary lymph nodes. Thyroid gland, trachea, and esophagus demonstrate no significant findings. Lungs/Pleura: Redemonstrated, widespread pulmonary and pleural nodularity, pleural disease more extensive on the left, which appears slightly increased compared to prior examination dated 02/23/2021. There is similar dense consolidation and atelectasis of the inferior left lower lobe and lingula with a small left pleural effusion. Upper Abdomen: No acute abnormality. Multiple hypodense hepatic metastases and multiple peritoneal nodules, not appreciably changed. Musculoskeletal: No chest wall abnormality. Unchanged lytic metastasis of the T10 vertebral body (series 6, image 113). IMPRESSION: 1. Redemonstrated, widespread bilateral pulmonary and pleural metastatic disease, which appears slightly worsened compared to prior examination dated 02/23/2021. 2. There is similar dense consolidation and atelectasis of the inferior left lower lobe and lingula with a small left pleural effusion. This is worrisome for infection or aspiration. 3. Additional hepatic, peritoneal, and osseous metastatic disease, not obviously changed as partially imaged. Electronically Signed   By: Delanna Ahmadi M.D.   On:  03/09/2021 13:17   CT Angio Chest PE W and/or Wo Contrast  Addendum Date: 02/24/2021   ADDENDUM REPORT: 02/24/2021 07:47 ADDENDUM: As stated in the original dictation, left-sided pleural metastasis, widespread pulmonary metastases and hepatic metastases all appear increased compared to prior examinations. In addition, there is consolidative airspace disease in the left lower lobe, concerning for pneumonia. Electronically Signed   By: Vinnie Langton M.D.   On: 02/24/2021 07:47   Result Date: 02/24/2021 CLINICAL DATA:  Pulmonary embolism suspected. High probability. Metastatic breast cancer. Shortness of breath. EXAM: CT ANGIOGRAPHY CHEST WITH CONTRAST TECHNIQUE: Multidetector CT imaging of the chest was performed using the standard protocol during bolus administration of intravenous contrast. Multiplanar CT image reconstructions and MIPs were obtained to evaluate the vascular anatomy. RADIATION DOSE REDUCTION: This exam was performed according to the departmental dose-optimization program which includes automated exposure control, adjustment of the mA and/or kV according to patient size and/or use of iterative reconstruction technique. CONTRAST:  175mL OMNIPAQUE IOHEXOL 350 MG/ML SOLN COMPARISON:  Chest radiography same day.  Chest CT 01/27/2021. FINDINGS: Cardiovascular: Cardiomegaly with left ventricular prominence. No visible aortic atherosclerotic calcification. Pulmonary arterial opacification is good. There are no pulmonary emboli. Mediastinum/Nodes: Over the last month, there has been considerable growth and mediastinal tumor. Anterior mediastinal disease previously measured at 15 and 17 mm on axial image 32 now measures 17 and 20 mm. Increased tumor growth in the subcarinal region and left hilum and along the left pericardial margin. Lungs/Pleura: Marked worsening of pulmonary metastatic disease. Innumerable nodules scattered throughout the lungs measuring 9 mm in size and smaller. There is worsening  collapse of the left lower lobe. Lobular pleural disease on the left has worsened. Upper Abdomen: Worsening of hepatic metastatic disease. More numerous and larger metastatic lesions. For example, metastasis at the dome of the liver on the right previously measured 18 mm in diameter and now measures 21 mm in diameter. Musculoskeletal: Lytic sclerotic metastatic disease throughout the spine and ribs appears similar. Review of the MIP images confirms the above findings. IMPRESSION: No pulmonary emboli. Progression of metastatic tumor within the mediastinum, left hilum, manifest as nodular metastatic lesions throughout both lungs, and within the liver. Worsened volume loss in the left lower lobe with progression of pleural disease. This interpretation is rendered on an emergent basis in order to rule out pulmonary emboli. The exam will be reviewed and addended  by body/oncologic specialist radiologist tomorrow. Electronically Signed: By: Paulina Fusi M.D. On: 02/23/2021 21:42   CARDIAC CATHETERIZATION  Result Date: 02/25/2021   The left ventricular systolic function is normal.   LV end diastolic pressure is normal.   The left ventricular ejection fraction is 55-65% by visual estimate. Conclusion Normal left ventricular function EF of 60% Normal coronaries Intervention deferred No conclusion   DG Chest Port 1 View  Result Date: 02/24/2021 CLINICAL DATA:  Shortness of breath, chest pain EXAM: PORTABLE CHEST 1 VIEW COMPARISON:  Previous studies including the examination of 02/23/2021 FINDINGS: Transverse diameter of heart is increased. There is opacification of left lower lung fields consistent with pleural effusion and possibly underlying infiltrate with no significant change. Are possible small nodular densities in the right mid and right lower lung fields. Right lateral CP angle is clear. There is no pneumothorax. IMPRESSION: No significant interval changes are noted in the opacification of left lower lung  fields suggesting pleural effusion and possibly underlying infiltrate. Electronically Signed   By: Ernie Avena M.D.   On: 02/24/2021 15:06   DG Chest Portable 1 View  Result Date: 02/23/2021 CLINICAL DATA:  Chest pain and shortness of breath. History of metastatic breast cancer. EXAM: PORTABLE CHEST 1 VIEW COMPARISON:  02/21/2021 and prior radiograph FINDINGS: The cardiomediastinal silhouette is unchanged. LEFT LOWER lung consolidation/atelectasis and LEFT pleural thickening again noted. No new pulmonary findings are noted. There is no evidence of pneumothorax or acute bony abnormality. IMPRESSION: Unchanged appearance of the chest with LEFT LOWER lung consolidation/atelectasis and LEFT pleural thickening. Electronically Signed   By: Harmon Pier M.D.   On: 02/23/2021 20:42   [place] EKG SITE RITE  Result Date: 03/10/2021 If Site Rite image not attached, placement could not be confirmed due to current cardiac rhythm.   SURGICAL PATHOLOGY     Reason for Addendum #1:  Breast Biomarker Results  Reason for Addendum #2:  Breast Biomarker Results   Clinical History: pleural effusion (cm)      FINAL MICROSCOPIC DIAGNOSIS:   A. PLEURA PEEL, LEFT:  - Metastatic adenocarcinoma.   COMMENT:   Immunohistochemistry is positive for cytokeratin 7, GATA-3, and ER. The  cells are negative for cytokeratin 20, CD56, synaptophysin,  chromogranin, TTF-1, NapsinA, p40 and cytokeratin 5/6. The findings are  consistent with metastatic mammary carcinoma. Prognostic markers will be  ordered.   INTRAOPERATIVE DIAGNOSIS:   A1. PLEURA PEEL, LEFT, FROZEN SECTION:         Non-small cell carcinoma.         Rapid intraoperative consult diagnosis rendered by Dr. Charm Barges @  0920 07/29/2020.    GROSS DESCRIPTION:   Received fresh for rapid intraoperative consult evaluation by frozen  section are multiple irregular pieces of pink-white to pale yellow soft  to indurated and vaguely nodular tissues, 2.9 x 2.4 x  0.5 cm in  aggregate.  Representative sections are submitted in block 1 for frozen  section, with remaining specimen submitted blocks 2, 3 for routine  histology.   SW 07/29/2020    Final Diagnosis performed by Valinda Hoar, MD.   Electronically signed  07/31/2020  Technical component performed at [facility]. [facility], [address] [place], [place] [address].   Professional component performed at [facility],  [address], [place], [place] [address].   Immunohistochemistry Technical component (if applicable) was performed  at [facility] [address],  [place], [place] [address].  IMMUNOHISTOCHEMISTRY DISCLAIMER (if applicable):  Some of these immunohistochemical stains may have been developed and the  performance characteristics determine by [facility]. Some  may not have been cleared or approved by the U.S. Food and Drug  Administration. The FDA has determined that such clearance or approval  is not necessary. This test is used for clinical purposes. It should not  be regarded as investigational or for research. This laboratory is  certified under the [place]  (CLIA-88) as qualified to perform high complexity clinical laboratory  testing.  The controls stained appropriately.   ADDENDUM:   PROGNOSTIC INDICATOR RESULTS:   Immunohistochemical and morphometric analysis performed manually   The tumor cells are EQUIVOCAL for Her2 (2+) with a heterogeneous  pattern. The most intense areas have been selected for Her2 FISH and  results will be reported in an addendum.   Estrogen Receptor:       POSITIVE, 80%, MODERATE TO STRONG STAINING  Progesterone Receptor:   POSITIVE, 25%, STRONG STAINING   Reference Range Estrogen and Progesterone Receptor       Negative  0%       Positive  >1%   All controls stained appropriately.         ASSESSMENT/PLAN      Acute on chronic hypoxemic respiratory failure - present on admission  - COVID19 negative - supplemental O2 during my evaluation 6 L/min nasal cannula - will perform infectious workup for pneumonia -Respiratory viral panel -serum fungitell -legionella ab -strep pneumoniae ur AG -Histoplasma Ur Ag -sputum resp cultures -AFB sputum expectorated specimen -sputum cytology  -reviewed pertinent imaging with patient today -CRP -please encourage patient to use incentive spirometer few times each hour while hospitalized.   -MetaNeb therapy for recruitment of left lower lobe consolidated infiltrate -Agree with Rocephin and Zithromax status post cefepime and vancomycin   Metastatic breast cancer -Patient is being followed by palliative care with hematology oncology service -Prognosis very poor at this point which is very unfortunate for this young patient -patient reports analgesia is inadequate and shares she awakes from sleep due to pain and has to ask for pain medication which takes a while after request.  I will schedule her dilaudid at current $RemoveBe'1mg'[id number]$  dose so she is not suffering from cancer associated pain  -poor prognosis overall   Left lower lobe atelectasis Continue albuterol nebulizer as well as MetaNeb therapy 3 times daily with respiratory therapist   Hypercalcemia of malignancy- resolved     -IV fluids and furosemide daily    -No severe EKG changes noted QRS slightly reduced to 90 MS   -nephrology consultation for additional evaluation management      Patient has severe multiple life-threatening active medical conditions which are evaluated and managed during this patient visit.  Thank you for allowing me to participate in the care of this patient.   This document was prepared using Dragon voice recognition software and may include unintentional dictation errors.     Ottie Glazier, M.D.  Division of [place]

## 2021-03-13 NOTE — Progress Notes (Signed)
[place]  Telephone:([phone] [phone]  ID: Kaylee Romero OB: June 28, 1980  MR#: [id number]  QIO#:[id number]  Patient Care Team: Dion Body, MD as PCP - General (Family Medicine) Gery Pray, MD as Consulting Physician (Radiation Oncology) Jovita Kussmaul, MD as Consulting Physician (General Surgery) Dillingham, Loel Lofty, DO as Attending Physician (Plastic Surgery) Lloyd Huger, MD as Consulting Physician (Oncology)  CHIEF COMPLAINT: Stage IV breast cancer with hypercalcemia and intractable pain.  INTERVAL HISTORY: Patient's hypercalcemia has resolved and she feels nearly back to her baseline.  Her pain is much better controlled on her current narcotic regimen.  Patient expressing desire to go home.  REVIEW OF SYSTEMS:   Review of Systems  Constitutional:  Positive for malaise/fatigue. Negative for fever and weight loss.  Respiratory: Negative.  Negative for cough, hemoptysis and shortness of breath.   Cardiovascular: Negative.  Negative for chest pain and leg swelling.  Gastrointestinal:  Negative for abdominal pain and nausea.  Genitourinary:  Positive for flank pain.  Musculoskeletal:  Negative for back pain.  Skin: Negative.  Negative for rash.  Neurological:  Positive for weakness. Negative for dizziness, focal weakness and headaches.  Psychiatric/Behavioral:  The patient is nervous/anxious.    As per HPI. Otherwise, a complete review of systems is negative.  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   Malignant neoplasm of upper-outer quadrant of  right breast in female, estrogen receptor positive [facility])    oncologist--- dr Grayland Ormond;;  first dx 09/ 2020 s/p right lumpectomy w/ node dissection and completed chemoradiation 08/ 2021;;  recurrent 07/ 2022 to lung pleura,liver, and thoracic spine   SOB (shortness of breath)    02-12-2021  pt denies with regular activity but sob with stairs    PAST SURGICAL HISTORY: Past Surgical History:  Procedure Laterality Date   ABDOMINAL HYSTERECTOMY N/A 02/17/2021   BREAST LUMPECTOMY WITH AXILLARY LYMPH NODE BIOPSY Right 11/21/2018   Procedure: RIGHT BREAST LUMPECTOMY WITH SENTINEL LYMPH NODE BIOPSY;  Surgeon: Jovita Kussmaul, MD;  Location: [place];  Service: General;  Laterality: Right;   BREAST REDUCTION SURGERY Bilateral 11/21/2018   Procedure: BILATERAL MAMMARY REDUCTION  (BREAST);  Surgeon: Wallace Going, DO;  Location: [place];  Service: Plastics;  Laterality: Bilateral;   CHEST TUBE INSERTION Left 07/29/2020   Procedure: INSERTION PLEURAL DRAINAGE CATHETER;  Surgeon: Lajuana Matte, MD;  Location: [place];  Service: Thoracic;  Laterality: Left;   IRRIGATION AND DEBRIDEMENT STERNOCLAVICULAR JOINT-STERNUM AND RIBS N/A 08/13/2020   Procedure: IRRIGATION AND DEBRIDEMENT CHEST WALL ABSCESS;  Surgeon: Lajuana Matte, MD;  Location: [place] [facility];  Service: Cardiothoracic;  Laterality: N/A;   IUD REMOVAL N/A 02/17/2021   Procedure: INTRAUTERINE DEVICE (IUD) REMOVAL;  Surgeon: Linda Hedges, DO;  Location: [place];  Service: Gynecology;  Laterality: N/A;   LAPAROSCOPIC SALPINGO OOPHERECTOMY Bilateral 02/17/2021   Procedure: LAPAROSCOPIC BILATERAL SALPINGO OOPHORECTOMY;  Surgeon: Linda Hedges, DO;  Location: [place];  Service: Gynecology;  Laterality: Bilateral;   LEFT HEART CATH AND CORONARY ANGIOGRAPHY N/A 02/25/2021  Procedure: LEFT HEART CATH AND CORONARY ANGIOGRAPHY possible PCI and stent;  Surgeon: Yolonda Kida, MD;  Location: [place] [facility];   Service: Cardiovascular;  Laterality: N/A;   PLEURAL BIOPSY Left 07/29/2020   Procedure: PLEURAL BIOPSY;  Surgeon: Lajuana Matte, MD;  Location: [place];  Service: Thoracic;  Laterality: Left;   PLEURAL EFFUSION DRAINAGE Left 07/29/2020   Procedure: DRAINAGE OF PLEURAL EFFUSION;  Surgeon: Lajuana Matte, MD;  Location: [place];  Service: Thoracic;  Laterality: Left;   PORT-A-CATH REMOVAL N/A 07/06/2019   Procedure: REMOVAL PORT-A-CATH;  Surgeon: Jovita Kussmaul, MD;  Location: [place];  Service: General;  Laterality: N/A;   PORTACATH PLACEMENT N/A 11/21/2018   Procedure: INSERTION LEFT PORT-A-CATH WITH ULTRASOUND GUIDANCE;  Surgeon: Jovita Kussmaul, MD;  Location: [place];  Service: General;  Laterality: N/A;   TONSILLECTOMY  1987   VIDEO ASSISTED THORACOSCOPY Left 07/29/2020   Procedure: VIDEO ASSISTED THORACOSCOPY;  Surgeon: Lajuana Matte, MD;  Location: [place];  Service: Thoracic;  Laterality: Left;   WISDOM TOOTH EXTRACTION      FAMILY HISTORY: Family History  Problem Relation Age of Onset   Hypertension Father    Alcohol abuse Paternal Grandfather    Heart disease Paternal Grandfather    Alcohol abuse Maternal Grandfather    Heart disease Maternal Grandmother    Breast cancer Other     ADVANCED DIRECTIVES (Y/N):  '@ADVDIR'$ @  HEALTH MAINTENANCE: Social History   Tobacco Use   Smoking status: Former    Packs/day: 0.50    Years: 20.00    Pack years: 10.00    Types: Cigarettes    Start date: 01/13/2002    Quit date: 10/17/2016    Years since quitting: 4.4   Smokeless tobacco: Never  Vaping Use   Vaping Use: Never used  Substance Use Topics   Alcohol use: Yes    Comment: Socially   Drug use: Never     Colonoscopy:  PAP:  Bone density:  Lipid panel:  Allergies  Allergen Reactions   Betadine [Povidone-Iodine] Rash    Current Facility-Administered Medications  Medication Dose Route Frequency Provider Last Rate Last Admin    acetaminophen (TYLENOL) tablet 1,000 mg  1,000 mg Oral Q6H PRN Agbata, Tochukwu, MD   1,000 mg at 03/10/21 5638   amphetamine-dextroamphetamine (ADDERALL XR) 24 hr capsule 30 mg  30 mg Oral q morning Enzo Bi, MD   30 mg at 03/13/21 1001   azithromycin (ZITHROMAX) tablet 500 mg  500 mg Oral Daily Benita Gutter, RPH   500 mg at 03/13/21 7564   Chlorhexidine Gluconate Cloth 2 % PADS 6 each  6 each Topical Daily Enzo Bi, MD   6 each at 03/13/21 0916   clonazePAM (KLONOPIN) disintegrating tablet 0.25 mg  0.25 mg Oral BID PRN Borders, Vonna Kotyk R, NP   0.25 mg at 03/13/21 1526   dexamethasone (DECADRON) tablet 4 mg  4 mg Oral Daily Priscella Mann, Sudheer B, MD   4 mg at 03/13/21 0916   enoxaparin (LOVENOX) injection 40 mg  40 mg Subcutaneous Q24H Agbata, Tochukwu, MD   40 mg at 03/12/21 2224   feeding supplement (ENSURE ENLIVE / ENSURE PLUS) liquid 237 mL  237 mL Oral TID BM Enzo Bi, MD   237 mL at 03/13/21 0914   guaiFENesin (ROBITUSSIN) 100 MG/5ML liquid 5 mL  5 mL Oral Q4H PRN Agbata, Tochukwu, MD   5 mL at 03/13/21 0914   HYDROmorphone (DILAUDID) injection  0.5 mg  0.5 mg Intravenous Once Sreenath, Sudheer B, MD       HYDROmorphone (DILAUDID) tablet 4-8 mg  4-8 mg Oral Q4H PRN Borders, Kirt Boys, NP   4 mg at 03/13/21 1217   ipratropium-albuterol (DUONEB) 0.5-2.5 (3) MG/3ML nebulizer solution 3 mL  3 mL Nebulization Q4H PRN Agbata, Tochukwu, MD   3 mL at 03/13/21 1107   ketorolac (TORADOL) 15 MG/ML injection 15-30 mg  15-30 mg Intravenous Q8H PRN Borders, Kirt Boys, NP   15 mg at 03/13/21 1217   metoprolol tartrate (LOPRESSOR) tablet 12.5 mg  12.5 mg Oral BID Ottie Glazier, MD   12.5 mg at 03/13/21 0916   morphine (MS CONTIN) 12 hr tablet 60 mg  60 mg Oral Q8H Borders, Joshua R, NP   60 mg at 03/13/21 1526   ondansetron (ZOFRAN) tablet 4 mg  4 mg Oral Q6H PRN Agbata, Tochukwu, MD       Or   ondansetron (ZOFRAN) injection 4 mg  4 mg Intravenous Q6H PRN Agbata, Tochukwu, MD   4 mg at 03/12/21 0902    piperacillin-tazobactam (ZOSYN) IVPB 3.375 g  3.375 g Intravenous Q8H Enzo Bi, MD 12.5 mL/hr at 03/13/21 0638 3.375 g at 03/13/21 1540   polyethylene glycol (MIRALAX / GLYCOLAX) packet 17 g  17 g Oral BID Borders, Kirt Boys, NP   17 g at 03/12/21 2219   prochlorperazine (COMPAZINE) injection 10 mg  10 mg Intravenous Q6H PRN Athena Masse, MD   10 mg at 03/10/21 1717   senna (SENOKOT) tablet 17.2 mg  2 tablet Oral BID Borders, Vonna Kotyk R, NP   17.2 mg at 03/13/21 0915   sodium chloride flush (NS) 0.9 % injection 10-40 mL  10-40 mL Intracatheter Q12H Enzo Bi, MD   10 mL at 03/13/21 0916   sodium chloride flush (NS) 0.9 % injection 10-40 mL  10-40 mL Intracatheter PRN Enzo Bi, MD       venlafaxine XR (EFFEXOR-XR) 24 hr capsule 150 mg  150 mg Oral Q breakfast Enzo Bi, MD   150 mg at 03/13/21 0915    OBJECTIVE: Vitals:   03/13/21 1112 03/13/21 1615  BP:  (!) 155/86  Pulse:  (!) 118  Resp:  16  Temp:  98 F (36.7 C)  SpO2: 94% 95%     Body mass index is 30.07 kg/m.    ECOG FS:1 - Symptomatic but completely ambulatory  General: Well-developed, well-nourished, no acute distress. Eyes: Pink conjunctiva, anicteric sclera. HEENT: Normocephalic, moist mucous membranes. Lungs: No audible wheezing or coughing. Heart: Regular rate and rhythm. Abdomen: Soft, nontender, no obvious distention. Musculoskeletal: No edema, cyanosis, or clubbing. Neuro: Alert, answering all questions appropriately. Cranial nerves grossly intact. Skin: No rashes or petechiae noted. Psych: Normal affect.  LAB RESULTS:  Lab Results  Component Value Date   NA 137 03/13/2021   K 3.6 03/13/2021   CL 99 03/13/2021   CO2 28 03/13/2021   GLUCOSE 103 (H) 03/13/2021   BUN 11 03/13/2021   CREATININE 0.58 03/13/2021   CALCIUM 9.7 03/13/2021   PROT 7.2 03/09/2021   ALBUMIN 3.5 03/09/2021   AST 95 (H) 03/09/2021   ALT 26 03/09/2021   ALKPHOS 78 03/09/2021   BILITOT 0.5 03/09/2021   GFRNONAA >60 03/13/2021    GFRAA >60 08/07/2019    Lab Results  Component Value Date   WBC 17.6 (H) 03/13/2021   NEUTROABS 13.0 (H) 03/09/2021   HGB 10.7 (L) 03/13/2021   HCT 31.1 (  L) 03/13/2021   MCV 97.2 03/13/2021   PLT 240 03/13/2021     STUDIES: DG Chest 2 View  Result Date: 03/09/2021 CLINICAL DATA:  Shortness of breath EXAM: CHEST - 2 VIEW COMPARISON:  Chest radiograph dated February 24, 2021 and CT examination dated February 23, 2021 FINDINGS: The heart is enlarged. Left basilar opacity which may represent atelectasis or infiltrate, unchanged. Multiple bilateral nodular opacities consistent with patient's known metastatic disease is unchanged. No appreciable pneumothorax. No acute osseous abnormality. IMPRESSION: 1.  Stable cardiomegaly. 2. Left basilar opacity which may represent atelectasis, infiltrate or small effusion, unchanged. 3. Numerous bilateral nodular pulmonary opacities consistent with metastatic disease. Electronically Signed   By: Keane Police D.O.   On: 03/09/2021 11:06   DG Chest 2 View  Result Date: 02/21/2021 CLINICAL DATA:  A 41 year old female presents with shortness of breath and history of breast cancer. EXAM: CHEST - 2 VIEW COMPARISON:  Chest x-ray from October 01, 2020 and CT of the chest from January 27, 2021. FINDINGS: Cardiomediastinal contours and hilar structures are stable. LEFT hemidiaphragm remains obscured. Signs of LEFT pleural effusion and basilar airspace process. Subtle background nodularity has increased in the RIGHT chest over time. No additional areas of frank consolidative changes. On limited assessment there is no acute skeletal process. Evidence of prior RIGHT axillary dissection presumably projecting over the RIGHT chest. IMPRESSION: 1. Malignant effusion in the LEFT chest with further consolidation or enlarging nodules and masses in this area. Difficult to exclude developing infection in the LEFT lung base. 2. Scattered small nodular densities in the RIGHT chest  appearing more conspicuous than on previous imaging may represent a combination of known metastatic disease and superimposed infection. Electronically Signed   By: Zetta Bills M.D.   On: 02/21/2021 13:22   CT CHEST WO CONTRAST  Result Date: 03/09/2021 CLINICAL DATA:  Pneumonia, shortness of breath EXAM: CT CHEST WITHOUT CONTRAST TECHNIQUE: Multidetector CT imaging of the chest was performed following the standard protocol without IV contrast. RADIATION DOSE REDUCTION: This exam was performed according to the departmental dose-optimization program which includes automated exposure control, adjustment of the mA and/or kV according to patient size and/or use of iterative reconstruction technique. COMPARISON:  CT chest angiogram, 02/23/2021, CT chest abdomen pelvis, 01/27/2021 FINDINGS: Cardiovascular: No significant vascular findings. Normal heart size. No pericardial effusion. Mediastinum/Nodes: No enlarged mediastinal, hilar, or axillary lymph nodes. Thyroid gland, trachea, and esophagus demonstrate no significant findings. Lungs/Pleura: Redemonstrated, widespread pulmonary and pleural nodularity, pleural disease more extensive on the left, which appears slightly increased compared to prior examination dated 02/23/2021. There is similar dense consolidation and atelectasis of the inferior left lower lobe and lingula with a small left pleural effusion. Upper Abdomen: No acute abnormality. Multiple hypodense hepatic metastases and multiple peritoneal nodules, not appreciably changed. Musculoskeletal: No chest wall abnormality. Unchanged lytic metastasis of the T10 vertebral body (series 6, image 113). IMPRESSION: 1. Redemonstrated, widespread bilateral pulmonary and pleural metastatic disease, which appears slightly worsened compared to prior examination dated 02/23/2021. 2. There is similar dense consolidation and atelectasis of the inferior left lower lobe and lingula with a small left pleural effusion. This is  worrisome for infection or aspiration. 3. Additional hepatic, peritoneal, and osseous metastatic disease, not obviously changed as partially imaged. Electronically Signed   By: Delanna Ahmadi M.D.   On: 03/09/2021 13:17   CT Angio Chest PE W and/or Wo Contrast  Addendum Date: 02/24/2021   ADDENDUM REPORT: 02/24/2021 07:47 ADDENDUM: As stated in the  original dictation, left-sided pleural metastasis, widespread pulmonary metastases and hepatic metastases all appear increased compared to prior examinations. In addition, there is consolidative airspace disease in the left lower lobe, concerning for pneumonia. Electronically Signed   By: Vinnie Langton M.D.   On: 02/24/2021 07:47   Result Date: 02/24/2021 CLINICAL DATA:  Pulmonary embolism suspected. High probability. Metastatic breast cancer. Shortness of breath. EXAM: CT ANGIOGRAPHY CHEST WITH CONTRAST TECHNIQUE: Multidetector CT imaging of the chest was performed using the standard protocol during bolus administration of intravenous contrast. Multiplanar CT image reconstructions and MIPs were obtained to evaluate the vascular anatomy. RADIATION DOSE REDUCTION: This exam was performed according to the departmental dose-optimization program which includes automated exposure control, adjustment of the mA and/or kV according to patient size and/or use of iterative reconstruction technique. CONTRAST:  160m OMNIPAQUE IOHEXOL 350 MG/ML SOLN COMPARISON:  Chest radiography same day.  Chest CT 01/27/2021. FINDINGS: Cardiovascular: Cardiomegaly with left ventricular prominence. No visible aortic atherosclerotic calcification. Pulmonary arterial opacification is good. There are no pulmonary emboli. Mediastinum/Nodes: Over the last month, there has been considerable growth and mediastinal tumor. Anterior mediastinal disease previously measured at 15 and 17 mm on axial image 32 now measures 17 and 20 mm. Increased tumor growth in the subcarinal region and left hilum and  along the left pericardial margin. Lungs/Pleura: Marked worsening of pulmonary metastatic disease. Innumerable nodules scattered throughout the lungs measuring 9 mm in size and smaller. There is worsening collapse of the left lower lobe. Lobular pleural disease on the left has worsened. Upper Abdomen: Worsening of hepatic metastatic disease. More numerous and larger metastatic lesions. For example, metastasis at the dome of the liver on the right previously measured 18 mm in diameter and now measures 21 mm in diameter. Musculoskeletal: Lytic sclerotic metastatic disease throughout the spine and ribs appears similar. Review of the MIP images confirms the above findings. IMPRESSION: No pulmonary emboli. Progression of metastatic tumor within the mediastinum, left hilum, manifest as nodular metastatic lesions throughout both lungs, and within the liver. Worsened volume loss in the left lower lobe with progression of pleural disease. This interpretation is rendered on an emergent basis in order to rule out pulmonary emboli. The exam will be reviewed and addended by body/oncologic specialist radiologist tomorrow. Electronically Signed: By: MNelson ChimesM.D. On: 02/23/2021 21:42   CARDIAC CATHETERIZATION  Result Date: 02/25/2021   The left ventricular systolic function is normal.   LV end diastolic pressure is normal.   The left ventricular ejection fraction is 55-65% by visual estimate. Conclusion Normal left ventricular function EF of 60% Normal coronaries Intervention deferred No conclusion   DG Chest Port 1 View  Result Date: 02/24/2021 CLINICAL DATA:  Shortness of breath, chest pain EXAM: PORTABLE CHEST 1 VIEW COMPARISON:  Previous studies including the examination of 02/23/2021 FINDINGS: Transverse diameter of heart is increased. There is opacification of left lower lung fields consistent with pleural effusion and possibly underlying infiltrate with no significant change. Are possible small nodular densities  in the right mid and right lower lung fields. Right lateral CP angle is clear. There is no pneumothorax. IMPRESSION: No significant interval changes are noted in the opacification of left lower lung fields suggesting pleural effusion and possibly underlying infiltrate. Electronically Signed   By: PElmer PickerM.D.   On: 02/24/2021 15:06   DG Chest Portable 1 View  Result Date: 02/23/2021 CLINICAL DATA:  Chest pain and shortness of breath. History of metastatic breast cancer. EXAM: PORTABLE CHEST 1  VIEW COMPARISON:  02/21/2021 and prior radiograph FINDINGS: The cardiomediastinal silhouette is unchanged. LEFT LOWER lung consolidation/atelectasis and LEFT pleural thickening again noted. No new pulmonary findings are noted. There is no evidence of pneumothorax or acute bony abnormality. IMPRESSION: Unchanged appearance of the chest with LEFT LOWER lung consolidation/atelectasis and LEFT pleural thickening. Electronically Signed   By: Margarette Canada M.D.   On: 02/23/2021 20:42   [place] EKG SITE RITE  Result Date: 03/10/2021 If Site Rite image not attached, placement could not be confirmed due to current cardiac rhythm.   ASSESSMENT: Stage IV breast cancer with hypercalcemia and intractable pain.  PLAN:    1.  Stage IV breast cancer: Patient was approximately midway through cycle 2 of her oral chemotherapy, Xeloda.  She has been instructed to discontinue the final days of her treatment and will reconsider starting with cycle 3 after discharge.  CT scan results noted and revealed possible progression of disease in her lungs, but is too soon to tell given recent change of treatment.  Follow-up in cancer center as previously scheduled. 2.  Hypercalcemia: Resolved with Zometa, IV fluids, and Lasix.  Appreciate nephrology input. 3.  Nausea/vomiting: Improved. 4.  Dehydration: Resolved.  We will discuss placing port next week given poor IV access. 5.  Pain: Appreciate palliative care input.  Continue current  regimen as an outpatient.  Follow-up with palliative care tomorrow and then again with medical oncology and palliative care next week.   6.  Leukocytosis: Likely reactive, monitor. 7.  Possible pneumonia: Continue antibiotics as ordered. 8.  Disposition: Discharge home today with follow-up in the cancer center as above.  Lloyd Huger, MD   03/13/2021 5:12 PM

## 2021-03-13 NOTE — TOC CM/SW Note (Signed)
Per respiratory, patient was sent home last admission with several boxes of Duoneb solution but no nebulizer machine. Ordered nebulizer through [employer]. ? ?Kaylee Romero, [place] ?[phone] ? ?

## 2021-03-13 NOTE — Discharge Summary (Signed)
Physician Discharge Summary  Kaylee Romero:[id number] DOB: Aug 17, 1980 DOA: 03/09/2021  PCP: Dion Body, MD  Admit date: 03/09/2021 Discharge date: 03/13/2021  Admitted From: Home Disposition:  Home  Recommendations for Outpatient Follow-up:  Follow up with PCP in 1-2 weeks Follow up with oncology Follow up with palliative care  Home Health:No  Equipment/Devices:None   Discharge Condition:Stable  CODE STATUS:FULL  Diet recommendation: Regular  Brief/Interim Summary: 41 y.o. 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  Patient states she completed a course of Levaquin and her symptoms had improved until 1 day prior to her admission when she developed worsening nausea and vomiting.  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 She has a cough that is nonproductive but denies having any fever or chills.   Hospital course has been focused on her pain control.  Patient requires increasingly high doses of narcotic and nonnarcotic pain medication with seemingly short-lived effect.  She states that the only medication that does provide relief is IV hydromorphone which cannot be done outside of the hospital setting.  Pulmonology, oncology, palliative care all involved in care.   Hypercalcemia has resolved.  Case discussed with nephrology.  They have signed off as of 3/7. Patients pain regimen was adjusted to achieve maximum possible pain relief.  Palliative care closely following in house and will see in office.  Patient referred to interventional  pain management.    Discharged home in stable condition.  Pain regimen, abx, and daily low dose lasix sent to outpatient pharmacy.    Discharge Diagnoses:  Principal Problem:   HCAP (healthcare-associated pneumonia) Active Problems:   Cancer-related pain   Hypercalcemia   ADD (attention deficit disorder)   Malignant neoplasm of upper-outer quadrant of right breast in female, estrogen receptor positive ([place])   Essential hypertension   Depression   Palliative care encounter  HCAP (healthcare-associated pneumonia) --Recurrent left lower lobe pneumonia vs delayed imaging resolution of previous treated PNA. --pulm consulted, rec broadened to zosyn Plan: DC home.  De-escalate to azithromycin.  Total 14 day course prescribed     Cancer-related pain Secondary to known widespread metastatic disease Plan: -- Multimodal pain regimen.  Oncology and palliative care both following.  Per palliative care patient did respond best to IV Toradol.  This has been initiated in house.  PO pain regimen has been tailored to achieve maximum pain control.  Patient will follow up with pall care and interventional pain management post dc       Hypercalcemia --nephrology consulted --Zoledronic acid 4 mg administered. --Hypercalcemia resolved, nephrology signed off 3/7 Plan: Outpatient labs     Depression Stable Continue venlafaxine   Essential hypertension Blood pressure stable Continue metoprolol   Malignant neoplasm of upper-outer quadrant of right breast in female, estrogen receptor positive ([place]) Patient with a known history of stage IV ER/PR positive, HER2 negative invasive carcinoma of the breast with widespread metastatic disease. Continue Xeloda --oncology consulted --oncology palliative care consulted --pain management, per oncology   ADD (attention deficit disorder) Stable --resume home Adderall  Discharge Instructions  Discharge Instructions     Diet -  low sodium heart  healthy   Complete by: As directed    Increase activity slowly   Complete by: As directed       Allergies as of 03/13/2021       Reactions   Betadine [povidone-iodine] Rash        Medication List     STOP taking these medications    atomoxetine 40 MG capsule Commonly known as: STRATTERA   guaiFENesin 100 MG/5ML liquid Commonly known as: ROBITUSSIN   guanFACINE 2 MG Tb24 ER tablet Commonly known as: INTUNIV   levofloxacin 750 MG tablet Commonly known as: Levaquin   meloxicam 7.5 MG tablet Commonly known as: Mobic   oxyCODONE 5 MG immediate release tablet Commonly known as: Oxy IR/ROXICODONE   prochlorperazine 10 MG tablet Commonly known as: COMPAZINE       TAKE these medications    acetaminophen 500 MG tablet Commonly known as: TYLENOL Take 1,000 mg by mouth every 6 (six) hours as needed.   albuterol 108 (90 Base) MCG/ACT inhaler Commonly known as: VENTOLIN HFA INHALE 2 PUFFS INTO THE LUNGS EVERY 6 HOURS AS NEEDED FOR WHEEZING OR SHORTNESS OF BREATH What changed: when to take this   ALPRAZolam 0.25 MG tablet Commonly known as: XANAX Take 1 tablet (0.25 mg total) by mouth 2 (two) times daily as needed for anxiety.   amphetamine-dextroamphetamine 30 MG 24 hr capsule Commonly known as: ADDERALL XR Take 30 mg by mouth every morning.   azithromycin 500 MG tablet Commonly known as: ZITHROMAX Take 1 tablet (500 mg total) by mouth daily for 10 days.   CALCIUM + D PO Take 1 tablet by mouth 3 (three) times daily.   capecitabine 500 MG tablet Commonly known as: XELODA TAKE 4 TABLETS BY MOUTH 2 TIMES DAILY AFTER A MEAL FOR 14 DAYS, THEN HOLD FOR 7 DAYS, REPEAT EVERY 21 DAYS.   dexamethasone 4 MG tablet Commonly known as: DECADRON Take 1 tablet (4 mg total) by mouth daily for 7 days. Start taking on: March 14, 2021   docusate sodium 100 MG capsule Commonly known as: COLACE Take 1 capsule (100 mg total) by mouth 2 (two) times daily as needed for mild  constipation.   furosemide 20 MG tablet Commonly known as: Lasix Take 1 tablet (20 mg total) by mouth daily.   HYDROmorphone 4 MG tablet Commonly known as: DILAUDID Take 1-2 tablets (4-8 mg total) by mouth every 4 (four) hours as needed for severe pain (breakthrough pain).   ibuprofen 200 MG tablet Commonly known as: ADVIL Take 400 mg by mouth every 6 (six) hours as needed for mild pain.   ipratropium-albuterol 0.5-2.5 (3) MG/3ML Soln Commonly known as: DUONEB Take 3 mLs by nebulization every 4 (four) hours as needed.   metoprolol tartrate 25 MG tablet Commonly known as: LOPRESSOR Take 1 tablet (25 mg total) by mouth 2 (two) times daily.   morphine 60 MG 12 hr tablet Commonly known as: MS CONTIN Take 1 tablet (60 mg total) by mouth every 8 (eight) hours for 7 days. What changed:  medication strength how much to take   ondansetron 8 MG tablet Commonly known as: ZOFRAN Take 1 tablet (8 mg total) by mouth every 8 (eight) hours as needed for nausea or vomiting.   polyethylene glycol 17 g packet Commonly known as: MIRALAX / GLYCOLAX Take 17 g by mouth daily. What changed:  when to take this reasons to take this   promethazine 25 MG tablet Commonly known as: PHENERGAN  Take 1 tablet (25 mg total) by mouth every 6 (six) hours as needed for nausea or vomiting.   venlafaxine XR 150 MG 24 hr capsule Commonly known as: EFFEXOR-XR TAKE 1 CAPSULE(150 MG) BY MOUTH DAILY WITH BREAKFAST What changed: See the new instructions.   VISINE OP Place 1 drop into both eyes daily as needed (redness).   Vitamin D3 50 MCG (2000 UT) Tabs Take 1 tablet by mouth daily at 12 noon.               Durable Medical Equipment  (From admission, onward)           Start     Ordered   03/13/21 0805  For home use only DME Nebulizer machine  Once       Question Answer Comment  Patient needs a nebulizer to treat with the following condition Asthma   Length of Need Lifetime       03/13/21 0804            Follow-up Information     Borders, Kirt Boys, NP Follow up.   Specialty: Hospice and Palliative Medicine Why: Call for followup after dc from hospital Contact information: [place] [address] [phone]         Lloyd Huger, MD Follow up.   Specialty: Oncology Why: Follow up as directed by Dr. Gaetana Michaelis information: [place] [address] [phone]                Allergies  Allergen Reactions   Betadine [Povidone-Iodine] Rash    Consultations: Oncology Pulmonology Palliative care   Procedures/Studies: DG Chest 2 View  Result Date: 03/09/2021 CLINICAL DATA:  Shortness of breath EXAM: CHEST - 2 VIEW COMPARISON:  Chest radiograph dated February 24, 2021 and CT examination dated February 23, 2021 FINDINGS: The heart is enlarged. Left basilar opacity which may represent atelectasis or infiltrate, unchanged. Multiple bilateral nodular opacities consistent with patient's known metastatic disease is unchanged. No appreciable pneumothorax. No acute osseous abnormality. IMPRESSION: 1.  Stable cardiomegaly. 2. Left basilar opacity which may represent atelectasis, infiltrate or small effusion, unchanged. 3. Numerous bilateral nodular pulmonary opacities consistent with metastatic disease. Electronically Signed   By: Keane Police D.O.   On: 03/09/2021 11:06   DG Chest 2 View  Result Date: 02/21/2021 CLINICAL DATA:  A 41 year old female presents with shortness of breath and history of breast cancer. EXAM: CHEST - 2 VIEW COMPARISON:  Chest x-ray from October 01, 2020 and CT of the chest from January 27, 2021. FINDINGS: Cardiomediastinal contours and hilar structures are stable. LEFT hemidiaphragm remains obscured. Signs of LEFT pleural effusion and basilar airspace process. Subtle background nodularity has increased in the RIGHT chest over time. No additional areas of frank consolidative  changes. On limited assessment there is no acute skeletal process. Evidence of prior RIGHT axillary dissection presumably projecting over the RIGHT chest. IMPRESSION: 1. Malignant effusion in the LEFT chest with further consolidation or enlarging nodules and masses in this area. Difficult to exclude developing infection in the LEFT lung base. 2. Scattered small nodular densities in the RIGHT chest appearing more conspicuous than on previous imaging may represent a combination of known metastatic disease and superimposed infection. Electronically Signed   By: Zetta Bills M.D.   On: 02/21/2021 13:22   CT CHEST WO CONTRAST  Result Date: 03/09/2021 CLINICAL DATA:  Pneumonia, shortness of breath EXAM: CT CHEST WITHOUT CONTRAST TECHNIQUE: Multidetector CT imaging of the  chest was performed following the standard protocol without IV contrast. RADIATION DOSE REDUCTION: This exam was performed according to the departmental dose-optimization program which includes automated exposure control, adjustment of the mA and/or kV according to patient size and/or use of iterative reconstruction technique. COMPARISON:  CT chest angiogram, 02/23/2021, CT chest abdomen pelvis, 01/27/2021 FINDINGS: Cardiovascular: No significant vascular findings. Normal heart size. No pericardial effusion. Mediastinum/Nodes: No enlarged mediastinal, hilar, or axillary lymph nodes. Thyroid gland, trachea, and esophagus demonstrate no significant findings. Lungs/Pleura: Redemonstrated, widespread pulmonary and pleural nodularity, pleural disease more extensive on the left, which appears slightly increased compared to prior examination dated 02/23/2021. There is similar dense consolidation and atelectasis of the inferior left lower lobe and lingula with a small left pleural effusion. Upper Abdomen: No acute abnormality. Multiple hypodense hepatic metastases and multiple peritoneal nodules, not appreciably changed. Musculoskeletal: No chest wall  abnormality. Unchanged lytic metastasis of the T10 vertebral body (series 6, image 113). IMPRESSION: 1. Redemonstrated, widespread bilateral pulmonary and pleural metastatic disease, which appears slightly worsened compared to prior examination dated 02/23/2021. 2. There is similar dense consolidation and atelectasis of the inferior left lower lobe and lingula with a small left pleural effusion. This is worrisome for infection or aspiration. 3. Additional hepatic, peritoneal, and osseous metastatic disease, not obviously changed as partially imaged. Electronically Signed   By: Delanna Ahmadi M.D.   On: 03/09/2021 13:17   CT Angio Chest PE W and/or Wo Contrast  Addendum Date: 02/24/2021   ADDENDUM REPORT: 02/24/2021 07:47 ADDENDUM: As stated in the original dictation, left-sided pleural metastasis, widespread pulmonary metastases and hepatic metastases all appear increased compared to prior examinations. In addition, there is consolidative airspace disease in the left lower lobe, concerning for pneumonia. Electronically Signed   By: Vinnie Langton M.D.   On: 02/24/2021 07:47   Result Date: 02/24/2021 CLINICAL DATA:  Pulmonary embolism suspected. High probability. Metastatic breast cancer. Shortness of breath. EXAM: CT ANGIOGRAPHY CHEST WITH CONTRAST TECHNIQUE: Multidetector CT imaging of the chest was performed using the standard protocol during bolus administration of intravenous contrast. Multiplanar CT image reconstructions and MIPs were obtained to evaluate the vascular anatomy. RADIATION DOSE REDUCTION: This exam was performed according to the departmental dose-optimization program which includes automated exposure control, adjustment of the mA and/or kV according to patient size and/or use of iterative reconstruction technique. CONTRAST:  135mL OMNIPAQUE IOHEXOL 350 MG/ML SOLN COMPARISON:  Chest radiography same day.  Chest CT 01/27/2021. FINDINGS: Cardiovascular: Cardiomegaly with left ventricular  prominence. No visible aortic atherosclerotic calcification. Pulmonary arterial opacification is good. There are no pulmonary emboli. Mediastinum/Nodes: Over the last month, there has been considerable growth and mediastinal tumor. Anterior mediastinal disease previously measured at 15 and 17 mm on axial image 32 now measures 17 and 20 mm. Increased tumor growth in the subcarinal region and left hilum and along the left pericardial margin. Lungs/Pleura: Marked worsening of pulmonary metastatic disease. Innumerable nodules scattered throughout the lungs measuring 9 mm in size and smaller. There is worsening collapse of the left lower lobe. Lobular pleural disease on the left has worsened. Upper Abdomen: Worsening of hepatic metastatic disease. More numerous and larger metastatic lesions. For example, metastasis at the dome of the liver on the right previously measured 18 mm in diameter and now measures 21 mm in diameter. Musculoskeletal: Lytic sclerotic metastatic disease throughout the spine and ribs appears similar. Review of the MIP images confirms the above findings. IMPRESSION: No pulmonary emboli. Progression of metastatic tumor within the  mediastinum, left hilum, manifest as nodular metastatic lesions throughout both lungs, and within the liver. Worsened volume loss in the left lower lobe with progression of pleural disease. This interpretation is rendered on an emergent basis in order to rule out pulmonary emboli. The exam will be reviewed and addended by body/oncologic specialist radiologist tomorrow. Electronically Signed: By: Nelson Chimes M.D. On: 02/23/2021 21:42   CARDIAC CATHETERIZATION  Result Date: 02/25/2021   The left ventricular systolic function is normal.   LV end diastolic pressure is normal.   The left ventricular ejection fraction is 55-65% by visual estimate. Conclusion Normal left ventricular function EF of 60% Normal coronaries Intervention deferred No conclusion   DG Chest Port 1  View  Result Date: 02/24/2021 CLINICAL DATA:  Shortness of breath, chest pain EXAM: PORTABLE CHEST 1 VIEW COMPARISON:  Previous studies including the examination of 02/23/2021 FINDINGS: Transverse diameter of heart is increased. There is opacification of left lower lung fields consistent with pleural effusion and possibly underlying infiltrate with no significant change. Are possible small nodular densities in the right mid and right lower lung fields. Right lateral CP angle is clear. There is no pneumothorax. IMPRESSION: No significant interval changes are noted in the opacification of left lower lung fields suggesting pleural effusion and possibly underlying infiltrate. Electronically Signed   By: Elmer Picker M.D.   On: 02/24/2021 15:06   DG Chest Portable 1 View  Result Date: 02/23/2021 CLINICAL DATA:  Chest pain and shortness of breath. History of metastatic breast cancer. EXAM: PORTABLE CHEST 1 VIEW COMPARISON:  02/21/2021 and prior radiograph FINDINGS: The cardiomediastinal silhouette is unchanged. LEFT LOWER lung consolidation/atelectasis and LEFT pleural thickening again noted. No new pulmonary findings are noted. There is no evidence of pneumothorax or acute bony abnormality. IMPRESSION: Unchanged appearance of the chest with LEFT LOWER lung consolidation/atelectasis and LEFT pleural thickening. Electronically Signed   By: Margarette Canada M.D.   On: 02/23/2021 20:42   [place] EKG SITE RITE  Result Date: 03/10/2021 If Site Rite image not attached, placement could not be confirmed due to current cardiac rhythm.     Subjective: Seen and examined on day of dc.  Pain control improved.  Tolerating PO.  Stable for dc home  Discharge Exam: Vitals:   03/13/21 1112 03/13/21 1615  BP:  (!) 155/86  Pulse:  (!) 118  Resp:  16  Temp:  98 F (36.7 C)  SpO2: 94% 95%   Vitals:   03/13/21 0413 03/13/21 0835 03/13/21 1112 03/13/21 1615  BP: (!) 179/90 (!) 167/78  (!) 155/86  Pulse: 94 (!) 110  (!)  118  Resp:  16  16  Temp: 98.6 F (37 C) 98.5 F (36.9 C)  98 F (36.7 C)  TempSrc:  Oral  Oral  SpO2: 93% 94% 94% 95%  Weight:      Height:        General: Pt is alert, awake, not in acute distress Cardiovascular: RRR, S1/S2 +, no rubs, no gallops Respiratory: CTA bilaterally, no wheezing, no rhonchi Abdominal: Soft, NT, ND, bowel sounds + Extremities: no edema, no cyanosis    The results of significant diagnostics from this hospitalization (including imaging, microbiology, ancillary and laboratory) are listed below for reference.     Microbiology: Recent Results (from the past 240 hour(s))  Resp Panel by RT-PCR (Flu A&B, Covid) Nasopharyngeal Swab     Status: None   Collection Time: 03/09/21 10:26 AM   Specimen: Nasopharyngeal Swab; Nasopharyngeal(NP) swabs in vial  transport medium  Result Value Ref Range Status   SARS Coronavirus 2 by RT PCR NEGATIVE NEGATIVE Final    Comment: (NOTE) SARS-CoV-2 target nucleic acids are NOT DETECTED.  The SARS-CoV-2 RNA is generally detectable in upper respiratory specimens during the acute phase of infection. The lowest concentration of SARS-CoV-2 viral copies this assay can detect is 138 copies/mL. A negative result does not preclude SARS-Cov-2 infection and should not be used as the sole basis for treatment or other patient management decisions. A negative result may occur with  improper specimen collection/handling, submission of specimen other than nasopharyngeal swab, presence of viral mutation(s) within the areas targeted by this assay, and inadequate number of viral copies(<138 copies/mL). A negative result must be combined with clinical observations, patient history, and epidemiological information. The expected result is Negative.  Fact Sheet for Patients:  EntrepreneurPulse.com.au  Fact Sheet for Healthcare Providers:  IncredibleEmployment.be  This test is no t yet approved or cleared  by the [place] FDA and  has been authorized for detection and/or diagnosis of SARS-CoV-2 by FDA under an Emergency Use Authorization (EUA). This EUA will remain  in effect (meaning this test can be used) for the duration of the COVID-19 declaration under Section 564(b)(1) of the Act, 21 U.S.C.section 360bbb-3(b)(1), unless the authorization is terminated  or revoked sooner.       Influenza A by PCR NEGATIVE NEGATIVE Final   Influenza B by PCR NEGATIVE NEGATIVE Final    Comment: (NOTE) The Xpert Xpress SARS-CoV-2/FLU/RSV plus assay is intended as an aid in the diagnosis of influenza from Nasopharyngeal swab specimens and should not be used as a sole basis for treatment. Nasal washings and aspirates are unacceptable for Xpert Xpress SARS-CoV-2/FLU/RSV testing.  Fact Sheet for Patients: EntrepreneurPulse.com.au  Fact Sheet for Healthcare Providers: IncredibleEmployment.be  This test is not yet approved or cleared by the [place] FDA and has been authorized for detection and/or diagnosis of SARS-CoV-2 by FDA under an Emergency Use Authorization (EUA). This EUA will remain in effect (meaning this test can be used) for the duration of the COVID-19 declaration under Section 564(b)(1) of the Act, 21 U.S.C. section 360bbb-3(b)(1), unless the authorization is terminated or revoked.  Performed at [facility], [address] [place], [place] [address]   MRSA Next Gen by PCR, Nasal     Status: None   Collection Time: 03/09/21 10:09 PM   Specimen: Nasal Mucosa; Nasal Swab  Result Value Ref Range Status   MRSA by PCR Next Gen NOT DETECTED NOT DETECTED Final    Comment: (NOTE) The GeneXpert MRSA Assay (FDA approved for NASAL specimens only), is one component of a comprehensive MRSA colonization surveillance program. It is not intended to diagnose MRSA infection nor to guide or monitor treatment for MRSA infections. Test  performance is not FDA approved in patients less than 92 years old. Performed at [facility], [place],  [address]   Respiratory (~20 pathogens) panel by PCR     Status: None   Collection Time: 03/09/21 10:09 PM   Specimen: Nasal Mucosa; Respiratory  Result Value Ref Range Status   Adenovirus NOT DETECTED NOT DETECTED Final   Coronavirus 229E NOT DETECTED NOT DETECTED Final    Comment: (NOTE) The Coronavirus on the Respiratory Panel, DOES NOT test for the novel  Coronavirus (2019 nCoV)    Coronavirus HKU1 NOT DETECTED NOT DETECTED Final   Coronavirus NL63 NOT DETECTED NOT DETECTED Final   Coronavirus OC43 NOT  DETECTED NOT DETECTED Final   Metapneumovirus NOT DETECTED NOT DETECTED Final   Rhinovirus / Enterovirus NOT DETECTED NOT DETECTED Final   Influenza A NOT DETECTED NOT DETECTED Final   Influenza B NOT DETECTED NOT DETECTED Final   Parainfluenza Virus 1 NOT DETECTED NOT DETECTED Final   Parainfluenza Virus 2 NOT DETECTED NOT DETECTED Final   Parainfluenza Virus 3 NOT DETECTED NOT DETECTED Final   Parainfluenza Virus 4 NOT DETECTED NOT DETECTED Final   Respiratory Syncytial Virus NOT DETECTED NOT DETECTED Final   Bordetella pertussis NOT DETECTED NOT DETECTED Final   Bordetella Parapertussis NOT DETECTED NOT DETECTED Final   Chlamydophila pneumoniae NOT DETECTED NOT DETECTED Final   Mycoplasma pneumoniae NOT DETECTED NOT DETECTED Final    Comment: Performed at [facility], [place] [address] [place], [place] [address]  Acid Fast Smear (AFB)     Status: None   Collection Time: 03/10/21 11:30 AM   Specimen: Sputum  Result Value Ref Range Status   AFB Specimen Processing Concentration  Final    Comment: (NOTE) Performed At: [facility] [place], [place] [id number] Rush Farmer MD MP:[id number]    Acid Fast Smear QNSAFB  Final    Comment: (NOTE) Test not performed. AFB Smear not performed due to specimen  source (blood) or insufficient specimen.    Source (AFB) BLOOD  Final    Comment: Performed at [facility], [address] [place], [place] [address]     Labs: BNP (last 3 results) Recent Labs    02/23/21 2019  BNP 61.9   Basic Metabolic Panel: Recent Labs  Lab 03/09/21 1130 03/10/21 0815 03/11/21 0330 03/12/21 0430 03/13/21 0420  NA 140 140 137 137 137  K 3.8 3.5 2.8* 3.6 3.6  CL 97* 104 97* 99 99  CO2 29 21* $Remo'27 27 28  'vIzsi$ GLUCOSE 125* 88 88 89 103*  BUN $Re'14 16 14 14 11  'Xmq$ CREATININE 0.70 0.70 0.66 0.51 0.58  CALCIUM 13.1* QUANTITY NOT SUFFICIENT, UNABLE TO PERFORM TEST   10.7* 9.6 9.1 9.7  MG  --   --  1.9 1.9 1.8   Liver Function Tests: Recent Labs  Lab 03/09/21 1130  AST 95*  ALT 26  ALKPHOS 78  BILITOT 0.5  PROT 7.2  ALBUMIN 3.5   Recent Labs  Lab 03/09/21 1130  LIPASE 22   No results for input(s): AMMONIA in the last 168 hours. CBC: Recent Labs  Lab 03/09/21 1026 03/10/21 0815 03/11/21 0330 03/12/21 0430 03/13/21 0420  WBC 14.4* 17.9* 19.0* 14.8* 17.6*  NEUTROABS 13.0*  --   --   --   --   HGB 13.4 11.6* 11.5* 11.4* 10.7*  HCT 39.8 34.1* 33.3* 33.4* 31.1*  MCV 96.4 98.3 96.8 96.8 97.2  PLT 348 299 283 273 240   Cardiac Enzymes: No results for input(s): CKTOTAL, CKMB, CKMBINDEX, TROPONINI in the last 168 hours. BNP: Invalid input(s): POCBNP CBG: No results for input(s): GLUCAP in the last 168 hours. D-Dimer No results for input(s): DDIMER in the last 72 hours. Hgb A1c No results for input(s): HGBA1C in the last 72 hours. Lipid Profile No results for input(s): CHOL, HDL, LDLCALC, TRIG, CHOLHDL, LDLDIRECT in the last 72 hours. Thyroid function studies No results for input(s): TSH, T4TOTAL, T3FREE, THYROIDAB in the last 72 hours.  Invalid input(s): FREET3 Anemia work up No results for input(s): VITAMINB12, FOLATE, FERRITIN, TIBC, IRON, RETICCTPCT in the last 72 hours. Urinalysis    Component Value  Date/Time   COLORURINE  YELLOW (A) 03/09/2021 1026   APPEARANCEUR CLOUDY (A) 03/09/2021 1026   LABSPEC 1.011 03/09/2021 1026   PHURINE 8.0 03/09/2021 1026   GLUCOSEU NEGATIVE 03/09/2021 1026   HGBUR NEGATIVE 03/09/2021 1026   BILIRUBINUR NEGATIVE 03/09/2021 1026   BILIRUBINUR Neg 01/18/2012 1458   KETONESUR NEGATIVE 03/09/2021 1026   PROTEINUR NEGATIVE 03/09/2021 1026   UROBILINOGEN 0.2 01/18/2012 1458   NITRITE NEGATIVE 03/09/2021 1026   LEUKOCYTESUR NEGATIVE 03/09/2021 1026   Sepsis Labs Invalid input(s): PROCALCITONIN,  WBC,  LACTICIDVEN Microbiology Recent Results (from the past 240 hour(s))  Resp Panel by RT-PCR (Flu A&B, Covid) Nasopharyngeal Swab     Status: None   Collection Time: 03/09/21 10:26 AM   Specimen: Nasopharyngeal Swab; Nasopharyngeal(NP) swabs in vial transport medium  Result Value Ref Range Status   SARS Coronavirus 2 by RT PCR NEGATIVE NEGATIVE Final    Comment: (NOTE) SARS-CoV-2 target nucleic acids are NOT DETECTED.  The SARS-CoV-2 RNA is generally detectable in upper respiratory specimens during the acute phase of infection. The lowest concentration of SARS-CoV-2 viral copies this assay can detect is 138 copies/mL. A negative result does not preclude SARS-Cov-2 infection and should not be used as the sole basis for treatment or other patient management decisions. A negative result may occur with  improper specimen collection/handling, submission of specimen other than nasopharyngeal swab, presence of viral mutation(s) within the areas targeted by this assay, and inadequate number of viral copies(<138 copies/mL). A negative result must be combined with clinical observations, patient history, and epidemiological information. The expected result is Negative.  Fact Sheet for Patients:  EntrepreneurPulse.com.au  Fact Sheet for Healthcare Providers:  IncredibleEmployment.be  This test is no t yet approved or cleared by the [place] FDA  and  has been authorized for detection and/or diagnosis of SARS-CoV-2 by FDA under an Emergency Use Authorization (EUA). This EUA will remain  in effect (meaning this test can be used) for the duration of the COVID-19 declaration under Section 564(b)(1) of the Act, 21 U.S.C.section 360bbb-3(b)(1), unless the authorization is terminated  or revoked sooner.       Influenza A by PCR NEGATIVE NEGATIVE Final   Influenza B by PCR NEGATIVE NEGATIVE Final    Comment: (NOTE) The Xpert Xpress SARS-CoV-2/FLU/RSV plus assay is intended as an aid in the diagnosis of influenza from Nasopharyngeal swab specimens and should not be used as a sole basis for treatment. Nasal washings and aspirates are unacceptable for Xpert Xpress SARS-CoV-2/FLU/RSV testing.  Fact Sheet for Patients: EntrepreneurPulse.com.au  Fact Sheet for Healthcare Providers: IncredibleEmployment.be  This test is not yet approved or cleared by the [place] FDA and has been authorized for detection and/or diagnosis of SARS-CoV-2 by FDA under an Emergency Use Authorization (EUA). This EUA will remain in effect (meaning this test can be used) for the duration of the COVID-19 declaration under Section 564(b)(1) of the Act, 21 U.S.C. section 360bbb-3(b)(1), unless the authorization is terminated or revoked.  Performed at [facility], [address] [place], [place] [address]   MRSA Next Gen by PCR, Nasal     Status: None   Collection Time: 03/09/21 10:09 PM   Specimen: Nasal Mucosa; Nasal Swab  Result Value Ref Range Status   MRSA by PCR Next Gen NOT DETECTED NOT DETECTED Final    Comment: (NOTE) The GeneXpert MRSA Assay (FDA approved for NASAL specimens only), is one component of a comprehensive MRSA colonization surveillance program. It is not intended to  diagnose MRSA infection nor to guide or monitor treatment for MRSA infections. Test performance is not FDA approved in  patients less than 60 years old. Performed at [facility], [place], [place] [address]   Respiratory (~20 pathogens) panel by PCR     Status: None   Collection Time: 03/09/21 10:09 PM   Specimen: Nasal Mucosa; Respiratory  Result Value Ref Range Status   Adenovirus NOT DETECTED NOT DETECTED Final   Coronavirus 229E NOT DETECTED NOT DETECTED Final    Comment: (NOTE) The Coronavirus on the Respiratory Panel, DOES NOT test for the novel  Coronavirus (2019 nCoV)    Coronavirus HKU1 NOT DETECTED NOT DETECTED Final   Coronavirus NL63 NOT DETECTED NOT DETECTED Final   Coronavirus OC43 NOT DETECTED NOT DETECTED Final   Metapneumovirus NOT DETECTED NOT DETECTED Final   Rhinovirus / Enterovirus NOT DETECTED NOT DETECTED Final   Influenza A NOT DETECTED NOT DETECTED Final   Influenza B NOT DETECTED NOT DETECTED Final   Parainfluenza Virus 1 NOT DETECTED NOT DETECTED Final   Parainfluenza Virus 2 NOT DETECTED NOT DETECTED Final   Parainfluenza Virus 3 NOT DETECTED NOT DETECTED Final   Parainfluenza Virus 4 NOT DETECTED NOT DETECTED Final   Respiratory Syncytial Virus NOT DETECTED NOT DETECTED Final   Bordetella pertussis NOT DETECTED NOT DETECTED Final   Bordetella Parapertussis NOT DETECTED NOT DETECTED Final   Chlamydophila pneumoniae NOT DETECTED NOT DETECTED Final   Mycoplasma pneumoniae NOT DETECTED NOT DETECTED Final    Comment: Performed at [facility], [place]. [address], [place], [place] [address]  Acid Fast Smear (AFB)     Status: None   Collection Time: 03/10/21 11:30 AM   Specimen: Sputum  Result Value Ref Range Status   AFB Specimen Processing Concentration  Final    Comment: (NOTE) Performed At: [facility] [place], [place] [id number] Rush Farmer MD FY:[id number]    Acid Fast Smear QNSAFB  Final    Comment: (NOTE) Test not performed. AFB Smear not performed due to specimen source (blood) or insufficient specimen.     Source (AFB) BLOOD  Final    Comment: Performed at [facility], [address] [place], [place] [address]     Time coordinating discharge: Over 30 minutes  SIGNED:   Sidney Ace, MD  [facility] 03/13/2021, 4:45 PM Pager   If 7PM-7AM, please contact night-coverage

## 2021-03-13 NOTE — TOC Progression Note (Signed)
Transition of Care (TOC) - Progression Note  ? ? ?Patient Details  ?Name: Kaylee Romero ?MRN: [id number] ?Date of Birth: 05/31/1980 ? ?Transition of Care (TOC) CM/SW Contact  ?Beverly Sessions, RN ?Phone Number: ?03/13/2021, 11:31 AM ? ?Clinical Narrative:    ?Nebulizer delivered to room by Danielle with [employer] ? ? ?  ?  ? ?Expected Discharge Plan and Services ?  ?  ?  ?  ?  ?                ?  ?  ?  ?  ?  ?  ?  ?  ?  ?  ? ? ?Social Determinants of Health (SDOH) Interventions ?  ? ?Readmission Risk Interventions ?No flowsheet data found. ? ?

## 2021-03-13 NOTE — Progress Notes (Signed)
[place] [facility] [phone] [phone]   Name: Kaylee DIENER Date: 03/13/2021 MRN: [id number]  DOB: Aug 20, 1980  Patient Care Team: Dion Body, MD as PCP - General (Family Medicine) Gery Pray, MD as Consulting Physician (Radiation Oncology) Jovita Kussmaul, MD as Consulting Physician (General Surgery) Dillingham, Loel Lofty, DO as Attending Physician (Plastic Surgery) Lloyd Huger, MD as Consulting Physician (Oncology)    REASON FOR CONSULTATION: Kaylee Romero is a 41 y.o. 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  Patient then developed recurrence on tamoxifen.  Patient developed worsening shortness of breath and was found to have recurrence in July 2022 with malignant pleural effusion ultimately requiring VATS procedure and Pleurx.  Patient is status post BSO on 02/17/2021. Patient has had second and third opinions from [facility] and [place].  She is on third line chemotherapy for breast cancer.  She is status post XRT to the spine/ribs.  Patient was admitted to the hospital on 03/09/2021 with hypercalcemia.  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   Malignant neoplasm of upper-outer quadrant of right breast in female, estrogen receptor positive [facility])    oncologist--- dr Grayland Ormond;;  first dx 09/ 2020 s/p right lumpectomy w/ node dissection and completed chemoradiation 08/ 2021;;  recurrent 07/ 2022 to lung pleura,liver, and thoracic spine   SOB (shortness of breath)    02-12-2021  pt denies with regular activity but sob with stairs    PAST SURGICAL HISTORY:  Past Surgical History:  Procedure Laterality Date   ABDOMINAL HYSTERECTOMY N/A 02/17/2021   BREAST LUMPECTOMY WITH AXILLARY LYMPH NODE BIOPSY Right 11/21/2018   Procedure: RIGHT BREAST LUMPECTOMY WITH SENTINEL LYMPH NODE BIOPSY;  Surgeon: Jovita Kussmaul, MD;  Location: [place];  Service: General;  Laterality: Right;   BREAST REDUCTION SURGERY Bilateral 11/21/2018   Procedure: BILATERAL MAMMARY REDUCTION  (BREAST);  Surgeon: Wallace Going, DO;  Location: [place];  Service: Plastics;  Laterality: Bilateral;   CHEST TUBE INSERTION Left 07/29/2020   Procedure: INSERTION PLEURAL DRAINAGE CATHETER;  Surgeon: Lajuana Matte, MD;  Location: [place];  Service: Thoracic;  Laterality: Left;   IRRIGATION AND DEBRIDEMENT STERNOCLAVICULAR JOINT-STERNUM AND RIBS N/A 08/13/2020   Procedure: IRRIGATION AND DEBRIDEMENT CHEST WALL ABSCESS;  Surgeon: Lajuana Matte, MD;  Location: [place] [facility];  Service: Cardiothoracic;  Laterality: N/A;   IUD REMOVAL N/A 02/17/2021   Procedure: INTRAUTERINE DEVICE (IUD) REMOVAL;  Surgeon: Linda Hedges, DO;  Location: [place];  Service: Gynecology;  Laterality: N/A;   LAPAROSCOPIC SALPINGO OOPHERECTOMY Bilateral 02/17/2021   Procedure: LAPAROSCOPIC BILATERAL SALPINGO OOPHORECTOMY;  Surgeon: Linda Hedges, DO;  Location: [facility];  Service: Gynecology;  Laterality: Bilateral;   LEFT HEART CATH AND CORONARY ANGIOGRAPHY N/A 02/25/2021   Procedure: LEFT HEART CATH AND  CORONARY ANGIOGRAPHY possible PCI and stent;  Surgeon: Yolonda Kida, MD;  Location: [place] [facility];  Service: Cardiovascular;  Laterality: N/A;   PLEURAL BIOPSY Left 07/29/2020   Procedure: PLEURAL BIOPSY;  Surgeon: Lajuana Matte, MD;  Location: [place];  Service: Thoracic;  Laterality: Left;   PLEURAL EFFUSION DRAINAGE Left 07/29/2020   Procedure: DRAINAGE OF PLEURAL EFFUSION;  Surgeon: Lajuana Matte, MD;  Location: [place];  Service: Thoracic;  Laterality: Left;   PORT-A-CATH REMOVAL N/A 07/06/2019   Procedure: REMOVAL PORT-A-CATH;  Surgeon: Jovita Kussmaul, MD;  Location: [place];  Service: General;  Laterality: N/A;   PORTACATH PLACEMENT N/A 11/21/2018   Procedure: INSERTION LEFT PORT-A-CATH WITH ULTRASOUND GUIDANCE;  Surgeon: Jovita Kussmaul, MD;  Location: [place];  Service: General;  Laterality: N/A;   [place] Left 07/29/2020   Procedure: VIDEO ASSISTED THORACOSCOPY;  Surgeon: Lajuana Matte, MD;  Location: [place];  Service: Thoracic;  Laterality: Left;   WISDOM TOOTH EXTRACTION      HEMATOLOGY/ONCOLOGY HISTORY:  Oncology History  Malignant neoplasm of upper-outer quadrant of right breast in female, estrogen receptor positive ([place])  09/29/2018 Initial Diagnosis   Malignant neoplasm of upper-outer quadrant of right breast in female, estrogen receptor positive ([place])   09/2018 -  Anti-estrogen oral therapy   Tamoxifen; held from 12/05/2018-09/06/2019 for surgery   10/05/2018 Cancer Staging   Staging form: Breast, AJCC 8th Edition - Clinical stage from 10/05/2018: Stage IB (cT2, cN0, cM0, G2, ER+, PR+, HER2-) - Signed by Chauncey Cruel, MD on 04/02/2020 Stage prefix: Initial diagnosis Histologic grading system: 3 grade system   10/13/2018 Genetic Testing   Negative genetic testing. No pathogenic variants identified on the [facility] Breast Cancer STAT Panel + Common Hereditary Cancers Panel. The Common  Hereditary Cancers Panel offered by [facility] includes sequencing and/or deletion duplication testing of the following 47 genes: APC, ATM, AXIN2, BARD1, BMPR1A, BRCA1, BRCA2, BRIP1, CDH1, CDKN2A (p14ARF), CDKN2A (p16INK4a), CKD4, CHEK2, CTNNA1, DICER1, EPCAM (Deletion/duplication testing only), GREM1 (promoter region deletion/duplication testing only), KIT, MEN1, MLH1, MSH2, MSH3, MSH6, MUTYH, NBN, NF1, NHTL1, PALB2, PDGFRA, PMS2, POLD1, POLE, PTEN, RAD50, RAD51C, RAD51D, SDHB, SDHC, SDHD, SMAD4, SMARCA4. STK11, TP53, TSC1, TSC2, and VHL.  The following genes were evaluated for sequence changes only: SDHA and HOXB13 c.251G>A variant only. The report date is 10/13/2018.    11/21/2018 Surgery   Right lumpectomy [name]) [phone]): IDC, grade 2, 2.6 cm, with DCIS. Negative margins. 1/4 lymph nodes positive for macrometastasis. ER/PR positive, HER-2 negative.   12/26/2018 - 06/01/2019 Chemotherapy   dexamethasone (DECADRON) 4 MG tablet, 1 of 1 cycle, Start date: 12/05/2018, End date: 03/02/2019  DOXOrubicin (ADRIAMYCIN) chemo injection 114 mg, 60 mg/m2 = 114 mg, Intravenous,  Once, 4 of 4 cycles. Administration: 114 mg (01/05/2019), 114 mg (01/19/2019), 114 mg (02/02/2019), 114 mg (02/15/2019)  palonosetron (ALOXI) injection 0.25 mg, 0.25 mg, Intravenous,  Once, 1 of 1 cycle. Administration: 0.25 mg (01/05/2019)  pegfilgrastim (NEULASTA ONPRO KIT) injection 6 mg, 6 mg, Subcutaneous, Once, 2 of 2 cycles  pegfilgrastim-jmdb (FULPHILA) injection 6 mg, 6 mg, Subcutaneous,  Once, 4 of 4 cycles. Administration: 6 mg (01/07/2019), 6 mg (01/21/2019), 6 mg (02/06/2019), 6 mg (02/17/2019)  cyclophosphamide (CYTOXAN) 1,140 mg in sodium chloride 0.9 % 250 mL chemo infusion, 600 mg/m2 =  1,140 mg, Intravenous,  Once, 4 of 4 cycles. Administration: 1,140 mg (01/05/2019), 1,140 mg (01/19/2019), 1,140 mg (02/02/2019), 1,140 mg (02/15/2019).  PACLitaxel (TAXOL) 150 mg in sodium chloride 0.9 % 250 mL chemo infusion (</= $RemoveBefor'80mg'fGkNzXNxfYvl$ /m2), 80  mg/m2 = 150 mg, Intravenous,  Once, 12 of 12 cycles. Administration: 150 mg (03/02/2019), 150 mg (03/09/2019), 150 mg (03/23/2019), 150 mg (03/30/2019), 150 mg (04/06/2019), 150 mg (04/20/2019), 150 mg (04/27/2019), 150 mg (05/03/2019), 150 mg (05/11/2019), 150 mg (05/18/2019), 150 mg (05/25/2019), 150 mg (06/01/2019)  fosaprepitant (EMEND) 150 mg  dexamethasone (DECADRON) 12 mg in sodium chloride 0.9 % 145 mL IVPB, , Intravenous,  Once, 4 of 4 cycles. Administration:  (01/05/2019),  (01/19/2019),  (02/02/2019),  (02/15/2019).   06/21/2019 - 08/11/2019 Radiation Therapy   The patient initially received a dose of 50.4 Gy in 28 fractions to the breast using whole-breast tangent fields. This was delivered using a 3-D conformal technique. The pt received a boost delivering an additional 16 Gy in 8 fractions using a electron boost with 100meV electrons. The total dose was 66.4 Gy.    11/12/2019 Cancer Staging   Staging form: Breast, AJCC 8th Edition - Pathologic: Stage IB (pT2, pN1a, cM0, G2, ER+, PR+, HER2-)     Oncotype testing   MammaPrint high risk     ALLERGIES:  is allergic to betadine [povidone-iodine].  MEDICATIONS:  Current Facility-Administered Medications  Medication Dose Route Frequency Provider Last Rate Last Admin   0.9 %  sodium chloride infusion   Intravenous Continuous Colon Flattery, NP 75 mL/hr at 03/12/21 1129 New Bag at 03/12/21 1129   acetaminophen (TYLENOL) tablet 1,000 mg  1,000 mg Oral Q6H PRN Agbata, Tochukwu, MD   1,000 mg at 03/10/21 0829   ALPRAZolam (XANAX) tablet 0.25 mg  0.25 mg Oral BID PRN Agbata, Tochukwu, MD   0.25 mg at 03/13/21 9038   amphetamine-dextroamphetamine (ADDERALL XR) 24 hr capsule 30 mg  30 mg Oral q morning Enzo Bi, MD   30 mg at 03/13/21 1001   azithromycin (ZITHROMAX) tablet 500 mg  500 mg Oral Daily Benita Gutter, RPH   500 mg at 03/13/21 3338   Chlorhexidine Gluconate Cloth 2 % PADS 6 each  6 each Topical Daily Enzo Bi, MD   6 each at 03/13/21 0916    dexamethasone (DECADRON) tablet 4 mg  4 mg Oral Daily Sreenath, Sudheer B, MD   4 mg at 03/13/21 0916   enoxaparin (LOVENOX) injection 40 mg  40 mg Subcutaneous Q24H Agbata, Tochukwu, MD   40 mg at 03/12/21 2224   feeding supplement (ENSURE ENLIVE / ENSURE PLUS) liquid 237 mL  237 mL Oral TID BM Enzo Bi, MD   237 mL at 03/13/21 0914   guaiFENesin (ROBITUSSIN) 100 MG/5ML liquid 5 mL  5 mL Oral Q4H PRN Agbata, Tochukwu, MD   5 mL at 03/13/21 0914   HYDROmorphone (DILAUDID) tablet 4-8 mg  4-8 mg Oral Q4H PRN Edom Schmuhl, Kirt Boys, NP       ipratropium-albuterol (DUONEB) 0.5-2.5 (3) MG/3ML nebulizer solution 3 mL  3 mL Nebulization Q4H PRN Agbata, Tochukwu, MD   3 mL at 03/12/21 1632   ketorolac (TORADOL) 15 MG/ML injection 15-30 mg  15-30 mg Intravenous Q8H PRN Radiance Deady, Kirt Boys, NP   15 mg at 03/12/21 1804   metoprolol tartrate (LOPRESSOR) tablet 12.5 mg  12.5 mg Oral BID Ottie Glazier, MD   12.5 mg at 03/13/21 0916   morphine (MS CONTIN) 12 hr tablet 60 mg  60 mg Oral Q8H Anarosa Kubisiak, Kirt Boys, NP       ondansetron (ZOFRAN) tablet 4 mg  4 mg Oral Q6H PRN Agbata, Tochukwu, MD       Or   ondansetron (ZOFRAN) injection 4 mg  4 mg Intravenous Q6H PRN Agbata, Tochukwu, MD   4 mg at 03/12/21 0902   piperacillin-tazobactam (ZOSYN) IVPB 3.375 g  3.375 g Intravenous Azzie Roup, MD 12.5 mL/hr at 03/13/21 0638 3.375 g at 03/13/21 9675   polyethylene glycol (MIRALAX / GLYCOLAX) packet 17 g  17 g Oral BID Quandarius Nill, Kirt Boys, NP   17 g at 03/12/21 2219   prochlorperazine (COMPAZINE) injection 10 mg  10 mg Intravenous Q6H PRN Athena Masse, MD   10 mg at 03/10/21 1717   senna (SENOKOT) tablet 17.2 mg  2 tablet Oral BID Tequan Redmon, Kirt Boys, NP   17.2 mg at 03/13/21 0915   sodium chloride flush (NS) 0.9 % injection 10-40 mL  10-40 mL Intracatheter Q12H Enzo Bi, MD   10 mL at 03/13/21 0916   sodium chloride flush (NS) 0.9 % injection 10-40 mL  10-40 mL Intracatheter PRN Enzo Bi, MD       venlafaxine XR (EFFEXOR-XR)  24 hr capsule 150 mg  150 mg Oral Q breakfast Enzo Bi, MD   150 mg at 03/13/21 0915    VITAL SIGNS: BP (!) 167/78 (BP Location: Left Leg)    Pulse (!) 110    Temp 98.5 F (36.9 C) (Oral)    Resp 16    Ht $Romero'5\' 3"'Vy$  (1.6 m)    Wt 169 lb 12.1 oz (77 kg)    LMP 01/06/2019 (Approximate)    SpO2 94%    BMI 30.07 kg/m  Filed Weights   03/09/21 0950  Weight: 169 lb 12.1 oz (77 kg)    Estimated body mass index is 30.07 kg/m as calculated from the following:   Height as of this encounter: $RemoveBeforeD'5\' 3"'MRMhSMASXBKjim$  (1.6 m).   Weight as of this encounter: 169 lb 12.1 oz (77 kg).  LABS: CBC:    Component Value Date/Time   WBC 17.6 (H) 03/13/2021 0420   HGB 10.7 (L) 03/13/2021 0420   HGB 13.1 06/01/2019 1340   HCT 31.1 (L) 03/13/2021 0420   PLT 240 03/13/2021 0420   PLT 322 06/01/2019 1340   MCV 97.2 03/13/2021 0420   NEUTROABS 13.0 (H) 03/09/2021 1026   LYMPHSABS 0.7 03/09/2021 1026   MONOABS 0.5 03/09/2021 1026   EOSABS 0.0 03/09/2021 1026   BASOSABS 0.0 03/09/2021 1026   Comprehensive Metabolic Panel:    Component Value Date/Time   NA 137 03/13/2021 0420   K 3.6 03/13/2021 0420   CL 99 03/13/2021 0420   CO2 28 03/13/2021 0420   BUN 11 03/13/2021 0420   CREATININE 0.58 03/13/2021 0420   CREATININE 0.59 06/01/2019 1340   GLUCOSE 103 (H) 03/13/2021 0420   CALCIUM 9.7 03/13/2021 0420   CALCIUM 10.7 (H) 03/10/2021 0815   AST 95 (H) 03/09/2021 1130   AST 21 06/01/2019 1340   ALT 26 03/09/2021 1130   ALT 26 06/01/2019 1340   ALKPHOS 78 03/09/2021 1130   BILITOT 0.5 03/09/2021 1130   BILITOT 0.5 06/01/2019 1340   PROT 7.2 03/09/2021 1130   ALBUMIN 3.5 03/09/2021 1130    RADIOGRAPHIC STUDIES: DG Chest 2 View  Result Date: 03/09/2021 CLINICAL DATA:  Shortness of breath EXAM: CHEST - 2 VIEW COMPARISON:  Chest radiograph dated February 24, 2021 and CT  examination dated February 23, 2021 FINDINGS: The heart is enlarged. Left basilar opacity which may represent atelectasis or infiltrate, unchanged.  Multiple bilateral nodular opacities consistent with patient's known metastatic disease is unchanged. No appreciable pneumothorax. No acute osseous abnormality. IMPRESSION: 1.  Stable cardiomegaly. 2. Left basilar opacity which may represent atelectasis, infiltrate or small effusion, unchanged. 3. Numerous bilateral nodular pulmonary opacities consistent with metastatic disease. Electronically Signed   By: Keane Police D.O.   On: 03/09/2021 11:06   DG Chest 2 View  Result Date: 02/21/2021 CLINICAL DATA:  A 41 year old female presents with shortness of breath and history of breast cancer. EXAM: CHEST - 2 VIEW COMPARISON:  Chest x-ray from October 01, 2020 and CT of the chest from January 27, 2021. FINDINGS: Cardiomediastinal contours and hilar structures are stable. LEFT hemidiaphragm remains obscured. Signs of LEFT pleural effusion and basilar airspace process. Subtle background nodularity has increased in the RIGHT chest over time. No additional areas of frank consolidative changes. On limited assessment there is no acute skeletal process. Evidence of prior RIGHT axillary dissection presumably projecting over the RIGHT chest. IMPRESSION: 1. Malignant effusion in the LEFT chest with further consolidation or enlarging nodules and masses in this area. Difficult to exclude developing infection in the LEFT lung base. 2. Scattered small nodular densities in the RIGHT chest appearing more conspicuous than on previous imaging may represent a combination of known metastatic disease and superimposed infection. Electronically Signed   By: Zetta Bills M.D.   On: 02/21/2021 13:22   CT CHEST WO CONTRAST  Result Date: 03/09/2021 CLINICAL DATA:  Pneumonia, shortness of breath EXAM: CT CHEST WITHOUT CONTRAST TECHNIQUE: Multidetector CT imaging of the chest was performed following the standard protocol without IV contrast. RADIATION DOSE REDUCTION: This exam was performed according to the departmental dose-optimization  program which includes automated exposure control, adjustment of the mA and/or kV according to patient size and/or use of iterative reconstruction technique. COMPARISON:  CT chest angiogram, 02/23/2021, CT chest abdomen pelvis, 01/27/2021 FINDINGS: Cardiovascular: No significant vascular findings. Normal heart size. No pericardial effusion. Mediastinum/Nodes: No enlarged mediastinal, hilar, or axillary lymph nodes. Thyroid gland, trachea, and esophagus demonstrate no significant findings. Lungs/Pleura: Redemonstrated, widespread pulmonary and pleural nodularity, pleural disease more extensive on the left, which appears slightly increased compared to prior examination dated 02/23/2021. There is similar dense consolidation and atelectasis of the inferior left lower lobe and lingula with a small left pleural effusion. Upper Abdomen: No acute abnormality. Multiple hypodense hepatic metastases and multiple peritoneal nodules, not appreciably changed. Musculoskeletal: No chest wall abnormality. Unchanged lytic metastasis of the T10 vertebral body (series 6, image 113). IMPRESSION: 1. Redemonstrated, widespread bilateral pulmonary and pleural metastatic disease, which appears slightly worsened compared to prior examination dated 02/23/2021. 2. There is similar dense consolidation and atelectasis of the inferior left lower lobe and lingula with a small left pleural effusion. This is worrisome for infection or aspiration. 3. Additional hepatic, peritoneal, and osseous metastatic disease, not obviously changed as partially imaged. Electronically Signed   By: Delanna Ahmadi M.D.   On: 03/09/2021 13:17   CT Angio Chest PE W and/or Wo Contrast  Addendum Date: 02/24/2021   ADDENDUM REPORT: 02/24/2021 07:47 ADDENDUM: As stated in the original dictation, left-sided pleural metastasis, widespread pulmonary metastases and hepatic metastases all appear increased compared to prior examinations. In addition, there is consolidative  airspace disease in the left lower lobe, concerning for pneumonia. Electronically Signed   By: Vinnie Langton M.D.   On:  02/24/2021 07:47   Result Date: 02/24/2021 CLINICAL DATA:  Pulmonary embolism suspected. High probability. Metastatic breast cancer. Shortness of breath. EXAM: CT ANGIOGRAPHY CHEST WITH CONTRAST TECHNIQUE: Multidetector CT imaging of the chest was performed using the standard protocol during bolus administration of intravenous contrast. Multiplanar CT image reconstructions and MIPs were obtained to evaluate the vascular anatomy. RADIATION DOSE REDUCTION: This exam was performed according to the departmental dose-optimization program which includes automated exposure control, adjustment of the mA and/or kV according to patient size and/or use of iterative reconstruction technique. CONTRAST:  130mL OMNIPAQUE IOHEXOL 350 MG/ML SOLN COMPARISON:  Chest radiography same day.  Chest CT 01/27/2021. FINDINGS: Cardiovascular: Cardiomegaly with left ventricular prominence. No visible aortic atherosclerotic calcification. Pulmonary arterial opacification is good. There are no pulmonary emboli. Mediastinum/Nodes: Over the last month, there has been considerable growth and mediastinal tumor. Anterior mediastinal disease previously measured at 15 and 17 mm on axial image 32 now measures 17 and 20 mm. Increased tumor growth in the subcarinal region and left hilum and along the left pericardial margin. Lungs/Pleura: Marked worsening of pulmonary metastatic disease. Innumerable nodules scattered throughout the lungs measuring 9 mm in size and smaller. There is worsening collapse of the left lower lobe. Lobular pleural disease on the left has worsened. Upper Abdomen: Worsening of hepatic metastatic disease. More numerous and larger metastatic lesions. For example, metastasis at the dome of the liver on the right previously measured 18 mm in diameter and now measures 21 mm in diameter. Musculoskeletal: Lytic  sclerotic metastatic disease throughout the spine and ribs appears similar. Review of the MIP images confirms the above findings. IMPRESSION: No pulmonary emboli. Progression of metastatic tumor within the mediastinum, left hilum, manifest as nodular metastatic lesions throughout both lungs, and within the liver. Worsened volume loss in the left lower lobe with progression of pleural disease. This interpretation is rendered on an emergent basis in order to rule out pulmonary emboli. The exam will be reviewed and addended by body/oncologic specialist radiologist tomorrow. Electronically Signed: By: Nelson Chimes M.D. On: 02/23/2021 21:42   CARDIAC CATHETERIZATION  Result Date: 02/25/2021   The left ventricular systolic function is normal.   LV end diastolic pressure is normal.   The left ventricular ejection fraction is 55-65% by visual estimate. Conclusion Normal left ventricular function EF of 60% Normal coronaries Intervention deferred No conclusion   DG Chest Port 1 View  Result Date: 02/24/2021 CLINICAL DATA:  Shortness of breath, chest pain EXAM: PORTABLE CHEST 1 VIEW COMPARISON:  Previous studies including the examination of 02/23/2021 FINDINGS: Transverse diameter of heart is increased. There is opacification of left lower lung fields consistent with pleural effusion and possibly underlying infiltrate with no significant change. Are possible small nodular densities in the right mid and right lower lung fields. Right lateral CP angle is clear. There is no pneumothorax. IMPRESSION: No significant interval changes are noted in the opacification of left lower lung fields suggesting pleural effusion and possibly underlying infiltrate. Electronically Signed   By: Elmer Picker M.D.   On: 02/24/2021 15:06   DG Chest Portable 1 View  Result Date: 02/23/2021 CLINICAL DATA:  Chest pain and shortness of breath. History of metastatic breast cancer. EXAM: PORTABLE CHEST 1 VIEW COMPARISON:  02/21/2021 and  prior radiograph FINDINGS: The cardiomediastinal silhouette is unchanged. LEFT LOWER lung consolidation/atelectasis and LEFT pleural thickening again noted. No new pulmonary findings are noted. There is no evidence of pneumothorax or acute bony abnormality. IMPRESSION: Unchanged appearance of the chest  with LEFT LOWER lung consolidation/atelectasis and LEFT pleural thickening. Electronically Signed   By: Margarette Canada M.D.   On: 02/23/2021 20:42   [place] EKG SITE RITE  Result Date: 03/10/2021 If Site Rite image not attached, placement could not be confirmed due to current cardiac rhythm.   PERFORMANCE STATUS (ECOG) : 1 - Symptomatic but completely ambulatory  Review of Systems Unless otherwise noted, a complete review of systems is negative.  Physical Exam General: NAD Pulmonary: Unlabored Extremities: no edema, no joint deformities Skin: no rashes Neurological: Weakness but otherwise nonfocal  IMPRESSION: Follow-up visit.    Patient continues to endorse upper abdominal pain.  She had another bowel movement this morning.  In past 24 hours, she has received a total of 3.5 mg IV hydromorphone, which reflects increased utilization over the previous 24-hour period.  Patient states that it is her goal to discharge home today.  Discussed adjustment of her pain regimen including option of rotating to transdermal fentanyl although this would take about 24 hours to reach Cmax.  Instead, will increase MS Contin to 60 mg every 8 hours.  We will further liberalize p.o. hydromorphone to 4 to 8 mg every 4 hours as needed.  Continue dexamethasone.  We will discontinue IV hydromorphone.  Patient endorses an anxiety/pain cycle, each exacerbating the other.  Encouraged use of her alprazolam as needed.  We could consider rotating to scheduled clonazepam for longer acting control.  Discussed with Dr. Adalberto Cole office and will have patient scheduled to see him early next week for consideration of interventional  procedures.  I am available to see patient tomorrow in clinic if needed.  PLAN: -Continue current scope of treatment -Increase MS Contin 60 mg every 8 hours. -Increase hydromorphone p.o. 4-8 mg every 4 hours as needed -Discontinue IV hydromorphone -Continue dexamethasone -Continue MiraLAX/senna twice daily  Case and plan discussed with Dr. Grayland Ormond, Dr. Priscella Mann   Time Total: 20 minutes  Visit consisted of counseling and education dealing with the complex and emotionally intense issues of symptom management and palliative care in the setting of serious and potentially life-threatening illness.Greater than 50%  of this time was spent counseling and coordinating care related to the above assessment and plan.  Signed by: Altha Harm, PhD, NP-C

## 2021-03-14 ENCOUNTER — Telehealth: Payer: Self-pay

## 2021-03-14 ENCOUNTER — Encounter: Payer: Self-pay | Admitting: Hospice and Palliative Medicine

## 2021-03-14 ENCOUNTER — Inpatient Hospital Stay: Payer: BC Managed Care – PPO | Attending: Hospice and Palliative Medicine | Admitting: Hospice and Palliative Medicine

## 2021-03-14 ENCOUNTER — Other Ambulatory Visit: Payer: Self-pay | Admitting: Internal Medicine

## 2021-03-14 DIAGNOSIS — G893 Neoplasm related pain (acute) (chronic): Secondary | ICD-10-CM | POA: Diagnosis not present

## 2021-03-14 DIAGNOSIS — R1310 Dysphagia, unspecified: Secondary | ICD-10-CM | POA: Insufficient documentation

## 2021-03-14 DIAGNOSIS — C50411 Malignant neoplasm of upper-outer quadrant of right female breast: Secondary | ICD-10-CM | POA: Diagnosis not present

## 2021-03-14 DIAGNOSIS — Z170 Estrogen receptor positive status [ER+]: Secondary | ICD-10-CM | POA: Diagnosis not present

## 2021-03-14 DIAGNOSIS — C7951 Secondary malignant neoplasm of bone: Secondary | ICD-10-CM | POA: Insufficient documentation

## 2021-03-14 DIAGNOSIS — C782 Secondary malignant neoplasm of pleura: Secondary | ICD-10-CM | POA: Insufficient documentation

## 2021-03-14 DIAGNOSIS — D72829 Elevated white blood cell count, unspecified: Secondary | ICD-10-CM | POA: Insufficient documentation

## 2021-03-14 DIAGNOSIS — C787 Secondary malignant neoplasm of liver and intrahepatic bile duct: Secondary | ICD-10-CM | POA: Insufficient documentation

## 2021-03-14 LAB — LEGIONELLA PNEUMOPHILA SEROGP 1 UR AG: L. pneumophila Serogp 1 Ur Ag: NEGATIVE

## 2021-03-14 NOTE — Progress Notes (Unsigned)
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  INTERVAL HISTORY: Patient returns to clinic today for further evaluation, hospital follow-up, and consideration of cycle 2 of Xeloda.  She tolerated her first cycle well without significant side effects.  She continues to have shortness of breath, but this has improved since discharge.  She denies any further fevers.  She continues to have left-sided chest/flank pain. She has a mild intermittent neuropathy, but no other neurologic complaints.  She has a good appetite.  She denies any cough or hemoptysis. She denies any nausea, vomiting, constipation, or diarrhea.  She has no urinary complaints.  Patient offers no further specific complaints today.  REVIEW OF SYSTEMS:   Review of Systems  Constitutional: Negative.  Negative for fever, malaise/fatigue and weight loss.  Respiratory:  Positive for shortness of breath. Negative for cough and hemoptysis.   Cardiovascular:  Positive for chest pain. Negative for leg swelling.  Gastrointestinal: Negative.  Negative for abdominal pain.  Genitourinary:  Positive for flank pain. Negative for dysuria.  Musculoskeletal:  Negative for back pain.  Skin: Negative.  Negative for rash.  Neurological:  Positive for tingling. Negative for dizziness, focal weakness, weakness and headaches.  Psychiatric/Behavioral:  The patient is nervous/anxious. The patient does  not have insomnia.    As per HPI. Otherwise, a complete review of systems is negative.  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   secondary to primiary breast cancer   Malignant neoplasm of upper-outer quadrant of right breast in female, estrogen receptor positive [facility])    oncologist--- dr Grayland Ormond;;  first dx 09/ 2020 s/p right lumpectomy w/ node dissection and completed chemoradiation 08/ 2021;;  recurrent 07/ 2022 to lung pleura,liver, and thoracic spine   SOB (shortness of breath)    02-12-2021  pt denies with regular activity but sob with stairs    PAST SURGICAL HISTORY: Past Surgical History:  Procedure Laterality Date   ABDOMINAL HYSTERECTOMY N/A 02/17/2021   BREAST LUMPECTOMY WITH AXILLARY LYMPH NODE BIOPSY Right 11/21/2018   Procedure: RIGHT BREAST LUMPECTOMY WITH SENTINEL LYMPH NODE BIOPSY;  Surgeon: Jovita Kussmaul, MD;  Location: [facility];  Service: General;  Laterality: Right;   BREAST REDUCTION SURGERY Bilateral 11/21/2018   Procedure: BILATERAL MAMMARY REDUCTION  (BREAST);  Surgeon: Wallace Going, DO;  Location: [place];  Service: Plastics;  Laterality: Bilateral;   CHEST TUBE INSERTION Left 07/29/2020   Procedure: INSERTION PLEURAL DRAINAGE CATHETER;  Surgeon: Lajuana Matte, MD;  Location: [place];  Service: Thoracic;  Laterality: Left;   IRRIGATION AND DEBRIDEMENT STERNOCLAVICULAR JOINT-STERNUM AND RIBS N/A 08/13/2020   Procedure: IRRIGATION AND DEBRIDEMENT CHEST  WALL ABSCESS;  Surgeon: Lajuana Matte, MD;  Location: [place];  Service: Cardiothoracic;  Laterality: N/A;   IUD REMOVAL N/A 02/17/2021   Procedure: INTRAUTERINE  DEVICE (IUD) REMOVAL;  Surgeon: Linda Hedges, DO;  Location: [place];  Service: Gynecology;  Laterality: N/A;   LAPAROSCOPIC SALPINGO OOPHERECTOMY Bilateral 02/17/2021   Procedure: LAPAROSCOPIC BILATERAL SALPINGO OOPHORECTOMY;  Surgeon: Linda Hedges, DO;  Location: [place];  Service: Gynecology;  Laterality: Bilateral;   LEFT HEART CATH AND CORONARY ANGIOGRAPHY N/A 02/25/2021   Procedure: LEFT HEART CATH AND CORONARY ANGIOGRAPHY possible PCI and stent;  Surgeon: Yolonda Kida, MD;  Location: [facility];  Service: Cardiovascular;  Laterality: N/A;   PLEURAL BIOPSY Left 07/29/2020   Procedure: PLEURAL BIOPSY;  Surgeon: Lajuana Matte, MD;  Location: [place];  Service: Thoracic;  Laterality: Left;   PLEURAL EFFUSION DRAINAGE Left 07/29/2020   Procedure: DRAINAGE OF PLEURAL EFFUSION;  Surgeon: Lajuana Matte, MD;  Location: [place] [facility];  Service: Thoracic;  Laterality: Left;   PORT-A-CATH REMOVAL N/A 07/06/2019   Procedure: REMOVAL PORT-A-CATH;  Surgeon: Jovita Kussmaul, MD;  Location: [place];  Service: General;  Laterality: N/A;   PORTACATH PLACEMENT N/A 11/21/2018   Procedure: INSERTION LEFT PORT-A-CATH WITH ULTRASOUND GUIDANCE;  Surgeon: Jovita Kussmaul, MD;  Location: [place];  Service: General;  Laterality: N/A;   TONSILLECTOMY  1987   VIDEO ASSISTED THORACOSCOPY Left 07/29/2020   Procedure: VIDEO ASSISTED THORACOSCOPY;  Surgeon: Lajuana Matte, MD;  Location: [facility];  Service: Thoracic;  Laterality: Left;   WISDOM TOOTH EXTRACTION      FAMILY HISTORY: Family History  Problem Relation Age of Onset   Hypertension Father    Alcohol abuse Paternal Grandfather    Heart disease Paternal Grandfather    Alcohol abuse Maternal Grandfather    Heart disease Maternal Grandmother    Breast cancer Other     ADVANCED DIRECTIVES (Y/N):  N  HEALTH MAINTENANCE: Social History   Tobacco Use   Smoking status: Former     Packs/day: 0.50    Years: 20.00    Pack years: 10.00    Types: Cigarettes    Start date: 01/13/2002    Quit date: 10/17/2016    Years since quitting: 4.4   Smokeless tobacco: Never  Vaping Use   Vaping Use: Never used  Substance Use Topics   Alcohol use: Yes    Comment: Socially   Drug use: Never     Colonoscopy:  PAP:  Bone density:  Lipid panel:  Allergies  Allergen Reactions   Betadine [Povidone-Iodine] Rash    Current Outpatient Medications  Medication Sig Dispense Refill   acetaminophen (TYLENOL) 500 MG tablet Take 1,000 mg by mouth every 6 (six) hours as needed.     albuterol (VENTOLIN HFA) 108 (90 Base) MCG/ACT inhaler INHALE 2 PUFFS INTO THE LUNGS EVERY 6 HOURS AS NEEDED FOR WHEEZING OR SHORTNESS OF BREATH (Patient taking differently: Inhale 2 puffs into the lungs 2 (two) times daily.) 6.7 g 2   amphetamine-dextroamphetamine (ADDERALL XR) 30 MG 24 hr capsule Take 30 mg by mouth every morning.     azithromycin (ZITHROMAX) 500 MG tablet Take 1 tablet (500 mg total) by mouth daily for 10 days. 10 tablet 0   Calcium Citrate-Vitamin D (CALCIUM + D PO) Take 1 tablet by mouth 3 (three) times daily.     capecitabine (XELODA) 500 MG tablet TAKE 4 TABLETS BY MOUTH 2 TIMES DAILY  AFTER A MEAL FOR 14 DAYS, THEN HOLD FOR 7 DAYS, REPEAT EVERY 21 DAYS. 112 tablet 0   Cholecalciferol (VITAMIN D3) 50 MCG (2000 UT) TABS Take 1 tablet by mouth daily at 12 noon.     clonazePAM (KLONOPIN) 0.5 MG tablet Take 0.5-1 tablets (0.25-0.5 mg total) by mouth 2 (two) times daily as needed for anxiety. 30 tablet 0   dexamethasone (DECADRON) 4 MG tablet Take 1 tablet (4 mg total) by mouth daily for 7 days. 7 tablet 0   docusate sodium (COLACE) 100 MG capsule Take 1 capsule (100 mg total) by mouth 2 (two) times daily as needed for mild constipation. 20 capsule 0   furosemide (LASIX) 20 MG tablet Take 1 tablet (20 mg total) by mouth daily. 30 tablet 1   HYDROmorphone (DILAUDID) 4 MG tablet Take 1-2 tablets  (4-8 mg total) by mouth every 4 (four) hours as needed for severe pain (breakthrough pain). 30 tablet 0   ibuprofen (ADVIL) 200 MG tablet Take 400 mg by mouth every 6 (six) hours as needed for mild pain.     ipratropium-albuterol (DUONEB) 0.5-2.5 (3) MG/3ML SOLN Take 3 mLs by nebulization every 4 (four) hours as needed. (Patient not taking: Reported on 03/09/2021) 360 mL 0   metoprolol tartrate (LOPRESSOR) 25 MG tablet Take 1 tablet (25 mg total) by mouth 2 (two) times daily. 60 tablet 1   morphine (MS CONTIN) 60 MG 12 hr tablet Take 1 tablet (60 mg total) by mouth every 8 (eight) hours for 7 days. 21 tablet 0   ondansetron (ZOFRAN) 8 MG tablet Take 1 tablet (8 mg total) by mouth every 8 (eight) hours as needed for nausea or vomiting. 30 tablet 1   polyethylene glycol (MIRALAX / GLYCOLAX) 17 g packet Take 17 g by mouth daily. (Patient taking differently: Take 17 g by mouth daily as needed for mild constipation.) 14 each 0   promethazine (PHENERGAN) 25 MG tablet Take 1 tablet (25 mg total) by mouth every 6 (six) hours as needed for nausea or vomiting. 30 tablet 2   Tetrahydrozoline HCl (VISINE OP) Place 1 drop into both eyes daily as needed (redness).     venlafaxine XR (EFFEXOR-XR) 150 MG 24 hr capsule TAKE 1 CAPSULE(150 MG) BY MOUTH DAILY WITH BREAKFAST 30 capsule 1   No current facility-administered medications for this visit.    OBJECTIVE: There were no vitals filed for this visit.      There is no height or weight on file to calculate BMI.    ECOG FS:0 - Asymptomatic  General: Well-developed, well-nourished, no acute distress. Eyes: Pink conjunctiva, anicteric sclera. HEENT: Normocephalic, moist mucous membranes. Lungs: No audible wheezing or coughing. Heart: Regular rate and rhythm. Abdomen: Soft, nontender, no obvious distention. Musculoskeletal: No edema, cyanosis, or clubbing. Neuro: Alert, answering all questions appropriately. Cranial nerves grossly intact. Skin: No rashes or  petechiae noted. Psych: Normal affect.  LAB RESULTS:  Lab Results  Component Value Date   NA 137 03/13/2021   K 3.6 03/13/2021   CL 99 03/13/2021   CO2 28 03/13/2021   GLUCOSE 103 (H) 03/13/2021   BUN 11 03/13/2021   CREATININE 0.58 03/13/2021   CALCIUM 9.7 03/13/2021   PROT 7.2 03/09/2021   ALBUMIN 3.5 03/09/2021   AST 95 (H) 03/09/2021   ALT 26 03/09/2021   ALKPHOS 78 03/09/2021   BILITOT 0.5 03/09/2021   GFRNONAA >60 03/13/2021   GFRAA >60 08/07/2019    Lab Results  Component Value Date  WBC 17.6 (H) 03/13/2021   NEUTROABS 13.0 (H) 03/09/2021   HGB 10.7 (L) 03/13/2021   HCT 31.1 (L) 03/13/2021   MCV 97.2 03/13/2021   PLT 240 03/13/2021     STUDIES: DG Chest 2 View  Result Date: 03/09/2021 CLINICAL DATA:  Shortness of breath EXAM: CHEST - 2 VIEW COMPARISON:  Chest radiograph dated February 24, 2021 and CT examination dated February 23, 2021 FINDINGS: The heart is enlarged. Left basilar opacity which may represent atelectasis or infiltrate, unchanged. Multiple bilateral nodular opacities consistent with patient's known metastatic disease is unchanged. No appreciable pneumothorax. No acute osseous abnormality. IMPRESSION: 1.  Stable cardiomegaly. 2. Left basilar opacity which may represent atelectasis, infiltrate or small effusion, unchanged. 3. Numerous bilateral nodular pulmonary opacities consistent with metastatic disease. Electronically Signed   By: Keane Police D.O.   On: 03/09/2021 11:06   DG Chest 2 View  Result Date: 02/21/2021 CLINICAL DATA:  A 41 year old female presents with shortness of breath and history of breast cancer. EXAM: CHEST - 2 VIEW COMPARISON:  Chest x-ray from October 01, 2020 and CT of the chest from January 27, 2021. FINDINGS: Cardiomediastinal contours and hilar structures are stable. LEFT hemidiaphragm remains obscured. Signs of LEFT pleural effusion and basilar airspace process. Subtle background nodularity has increased in the RIGHT chest  over time. No additional areas of frank consolidative changes. On limited assessment there is no acute skeletal process. Evidence of prior RIGHT axillary dissection presumably projecting over the RIGHT chest. IMPRESSION: 1. Malignant effusion in the LEFT chest with further consolidation or enlarging nodules and masses in this area. Difficult to exclude developing infection in the LEFT lung base. 2. Scattered small nodular densities in the RIGHT chest appearing more conspicuous than on previous imaging may represent a combination of known metastatic disease and superimposed infection. Electronically Signed   By: Zetta Bills M.D.   On: 02/21/2021 13:22   CT CHEST WO CONTRAST  Result Date: 03/09/2021 CLINICAL DATA:  Pneumonia, shortness of breath EXAM: CT CHEST WITHOUT CONTRAST TECHNIQUE: Multidetector CT imaging of the chest was performed following the standard protocol without IV contrast. RADIATION DOSE REDUCTION: This exam was performed according to the departmental dose-optimization program which includes automated exposure control, adjustment of the mA and/or kV according to patient size and/or use of iterative reconstruction technique. COMPARISON:  CT chest angiogram, 02/23/2021, CT chest abdomen pelvis, 01/27/2021 FINDINGS: Cardiovascular: No significant vascular findings. Normal heart size. No pericardial effusion. Mediastinum/Nodes: No enlarged mediastinal, hilar, or axillary lymph nodes. Thyroid gland, trachea, and esophagus demonstrate no significant findings. Lungs/Pleura: Redemonstrated, widespread pulmonary and pleural nodularity, pleural disease more extensive on the left, which appears slightly increased compared to prior examination dated 02/23/2021. There is similar dense consolidation and atelectasis of the inferior left lower lobe and lingula with a small left pleural effusion. Upper Abdomen: No acute abnormality. Multiple hypodense hepatic metastases and multiple peritoneal nodules, not  appreciably changed. Musculoskeletal: No chest wall abnormality. Unchanged lytic metastasis of the T10 vertebral body (series 6, image 113). IMPRESSION: 1. Redemonstrated, widespread bilateral pulmonary and pleural metastatic disease, which appears slightly worsened compared to prior examination dated 02/23/2021. 2. There is similar dense consolidation and atelectasis of the inferior left lower lobe and lingula with a small left pleural effusion. This is worrisome for infection or aspiration. 3. Additional hepatic, peritoneal, and osseous metastatic disease, not obviously changed as partially imaged. Electronically Signed   By: Delanna Ahmadi M.D.   On: 03/09/2021 13:17   CT Angio Chest  PE W and/or Wo Contrast  Addendum Date: 02/24/2021   ADDENDUM REPORT: 02/24/2021 07:47 ADDENDUM: As stated in the original dictation, left-sided pleural metastasis, widespread pulmonary metastases and hepatic metastases all appear increased compared to prior examinations. In addition, there is consolidative airspace disease in the left lower lobe, concerning for pneumonia. Electronically Signed   By: Vinnie Langton M.D.   On: 02/24/2021 07:47   Result Date: 02/24/2021 CLINICAL DATA:  Pulmonary embolism suspected. High probability. Metastatic breast cancer. Shortness of breath. EXAM: CT ANGIOGRAPHY CHEST WITH CONTRAST TECHNIQUE: Multidetector CT imaging of the chest was performed using the standard protocol during bolus administration of intravenous contrast. Multiplanar CT image reconstructions and MIPs were obtained to evaluate the vascular anatomy. RADIATION DOSE REDUCTION: This exam was performed according to the departmental dose-optimization program which includes automated exposure control, adjustment of the mA and/or kV according to patient size and/or use of iterative reconstruction technique. CONTRAST:  154mL OMNIPAQUE IOHEXOL 350 MG/ML SOLN COMPARISON:  Chest radiography same day.  Chest CT 01/27/2021. FINDINGS:  Cardiovascular: Cardiomegaly with left ventricular prominence. No visible aortic atherosclerotic calcification. Pulmonary arterial opacification is good. There are no pulmonary emboli. Mediastinum/Nodes: Over the last month, there has been considerable growth and mediastinal tumor. Anterior mediastinal disease previously measured at 15 and 17 mm on axial image 32 now measures 17 and 20 mm. Increased tumor growth in the subcarinal region and left hilum and along the left pericardial margin. Lungs/Pleura: Marked worsening of pulmonary metastatic disease. Innumerable nodules scattered throughout the lungs measuring 9 mm in size and smaller. There is worsening collapse of the left lower lobe. Lobular pleural disease on the left has worsened. Upper Abdomen: Worsening of hepatic metastatic disease. More numerous and larger metastatic lesions. For example, metastasis at the dome of the liver on the right previously measured 18 mm in diameter and now measures 21 mm in diameter. Musculoskeletal: Lytic sclerotic metastatic disease throughout the spine and ribs appears similar. Review of the MIP images confirms the above findings. IMPRESSION: No pulmonary emboli. Progression of metastatic tumor within the mediastinum, left hilum, manifest as nodular metastatic lesions throughout both lungs, and within the liver. Worsened volume loss in the left lower lobe with progression of pleural disease. This interpretation is rendered on an emergent basis in order to rule out pulmonary emboli. The exam will be reviewed and addended by body/oncologic specialist radiologist tomorrow. Electronically Signed: By: Nelson Chimes M.D. On: 02/23/2021 21:42   CARDIAC CATHETERIZATION  Result Date: 02/25/2021   The left ventricular systolic function is normal.   LV end diastolic pressure is normal.   The left ventricular ejection fraction is 55-65% by visual estimate. Conclusion Normal left ventricular function EF of 60% Normal coronaries  Intervention deferred No conclusion   DG Chest Port 1 View  Result Date: 02/24/2021 CLINICAL DATA:  Shortness of breath, chest pain EXAM: PORTABLE CHEST 1 VIEW COMPARISON:  Previous studies including the examination of 02/23/2021 FINDINGS: Transverse diameter of heart is increased. There is opacification of left lower lung fields consistent with pleural effusion and possibly underlying infiltrate with no significant change. Are possible small nodular densities in the right mid and right lower lung fields. Right lateral CP angle is clear. There is no pneumothorax. IMPRESSION: No significant interval changes are noted in the opacification of left lower lung fields suggesting pleural effusion and possibly underlying infiltrate. Electronically Signed   By: Elmer Picker M.D.   On: 02/24/2021 15:06   DG Chest Portable 1 View  Result  Date: 02/23/2021 CLINICAL DATA:  Chest pain and shortness of breath. History of metastatic breast cancer. EXAM: PORTABLE CHEST 1 VIEW COMPARISON:  02/21/2021 and prior radiograph FINDINGS: The cardiomediastinal silhouette is unchanged. LEFT LOWER lung consolidation/atelectasis and LEFT pleural thickening again noted. No new pulmonary findings are noted. There is no evidence of pneumothorax or acute bony abnormality. IMPRESSION: Unchanged appearance of the chest with LEFT LOWER lung consolidation/atelectasis and LEFT pleural thickening. Electronically Signed   By: Margarette Canada M.D.   On: 02/23/2021 20:42   [place] EKG SITE RITE  Result Date: 03/10/2021 If Site Rite image not attached, placement could not be confirmed due to current cardiac rhythm.   ONCOLOGY HISTORY: Patient initially self palpated a lump in the 12 o'clock position of her right breast and subsequently underwent biopsy on September 26, 2018 confirming malignancy.  Initial MammaPrint was reported high risk.  She underwent right lumpectomy on November 21, 2018 which revealed a T2, N1, M0 stage IIa malignancy.  1 of 4  lymph nodes were positive for disease.  She subsequently underwent adjuvant chemotherapy with AC/Taxol completing treatment on Jun 01, 2019.  She then completed adjuvant XRT on August 11, 2019 and was started on tamoxifen.  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 PET scan results from July 18, 2020 reviewed independently with circumferential pleural disease and a loculated left pleural effusion.  MRI of the brain on August 18, 2020 was negative for metastatic disease.  Patient also underwent second opinion at [facility] in [place].  MRI of thoracic spine on December 13, 2020 revealed a likely metastatic lesion in T9 vertebrae.  Despite the fact that her CA 27-29 is trending down, recent CT scan revealed progression of disease in her liver and possibly her lungs.  Total hysterectomy also noted a metastatic deposit in her ovary. Case discussed with Dr. Samuel Bouche at [facility].  Proceed with cycle 2 of Xeloda for 14 days on and 7 days off.  Repeat CT scan in ~2 months.  Zometa was discontinued secondary to intolerance, therefore proceed with Xgeva on even numbered cycles.  Return to clinic in 3 weeks for further evaluation and continuation of treatment.  Cough/shortness of breath: Improved.  Continue Tussionex, hydrocodone, and albuterol inhaler.  Complete antibiotics as prescribed. Anasarca/fluid retention: Resolved.   Pleural abscess: Treated and drained by thoracic surgery in [place].  Resolved.  Chest x-ray and possible thoracentesis as above.   Pain: MS Contin was increased to 30 mg every 8 hours.  Continue oxycodone as needed.  Patient was also initiated on meloxicam.  Follow-up with radiation  oncology as scheduled.  Appreciate palliative care input.  Oophorectomy: Completed on February 17, 2021.  Pathology results as above. Supportive care: Patient has been seen by dietary and referral sent to social work.  Patient expressed understanding and was in agreement with this plan. She also understands that She can call clinic at any time with any questions, concerns, or complaints.     Cancer Staging  Malignant neoplasm of upper-outer quadrant of right breast in female, estrogen receptor positive ([place]) Staging form: Breast, AJCC 8th Edition - Clinical stage from 10/05/2018: Stage IB (cT2, cN0, cM0, G2, ER+, PR+, HER2-) - Signed by Lurline Del  C, MD on 04/02/2020 Stage prefix: Initial diagnosis Histologic grading system: 3 grade system - Pathologic stage from 08/09/2020: No Stage Recommended (ypT2, pN1a, pM1, G2, ER+, PR+, HER2-) - Signed by Lloyd Huger, MD on 08/09/2020 Stage prefix: Post-therapy Multigene prognostic tests performed: MammaPrint Histologic grading system: 3 grade system   Lloyd Huger, MD   03/14/2021 9:32 AM

## 2021-03-14 NOTE — Progress Notes (Signed)
Virtual Visit via Telephone Note ? ?I connected with Kaylee Romero on 03/14/21 at 10:30 AM EST by telephone and verified that I am speaking with the correct person using two identifiers. ? ?Location: ?Patient: Home ?Provider: Clinic ?  ?I discussed the limitations, risks, security and privacy concerns of performing an evaluation and management service by telephone and the availability of in person appointments. I also discussed with the patient that there may be a patient responsible charge related to this service. The patient expressed understanding and agreed to proceed. ? ? ?History of Present Illness: ?Kaylee Romero is a 40 y.o. 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  Patient then developed recurrence on tamoxifen.  Patient developed worsening shortness of breath and was found to have recurrence in July 2022 with malignant pleural effusion ultimately requiring VATS procedure and Pleurx.  Patient is status post BSO on 02/17/2021. Patient has had second and third opinions from [facility] and [facility].  She is on third line chemotherapy for breast cancer.  She is status post XRT to the spine/ribs.  Patient was admitted to the hospital on 03/09/2021 with hypercalcemia.  She is referred to palliative care to help address goals and manage ongoing symptoms ?  ?Observations/Objective: ?I spoke with patient by phone. ? ?She is doing reasonably well since discharging from the hospital.  She is able to go out with lunch earlier with family.  She still endorses pain but this seems reasonably controlled at present.  Patient has follow-up with Dr. Naveira next week. ? ?Patient had questions about her  medications, which we reviewed reviewed in detail.  All questions answered to her verbalized satisfaction. ? ?Assessment and Plan: ?Stage IV breast cancer -discussed with Dr. Finnegan and patient advised to hold Xeloda until she is seen in clinic. ? ?Neoplasm related pain -continue MS Contin with use of oral hydromorphone as needed for breakthrough pain ? ?Anxiety -as needed clonazepam ? ?Follow Up Instructions: ?Patient RTC next week to see Dr. Finnegan ? ?Case and plan discussed with Dr. Finnegan ?  ?I discussed the assessment and treatment plan with the patient. The patient was provided an opportunity to ask questions and all were answered. The patient agreed with the plan and demonstrated an understanding of the instructions. ?  ?The patient was advised to call back or seek an in-person evaluation if the symptoms worsen or if the condition fails to improve as anticipated. ? ?I provided 10 minutes of non-face-to-face time during this encounter. ? ? ? R , NP ? ? ?

## 2021-03-14 NOTE — Telephone Encounter (Signed)
Phoned pt re: MyChart message. Per [name], he does not want to rotate to Fentanyl patch due to the fact that there will be limited availability over the weekend for a provider to respond if pain is not controlled. Kaylee Romero also advised that patient try taking the clonazepam BID and see if that helps any. Pt was informed of recommendations and verbalized understanding.  ?

## 2021-03-14 NOTE — Telephone Encounter (Signed)
Pt contacted as requested by Altha Harm, NP. Per Josh, pt to hold Xeloda until seen again by Dr. Grayland Ormond. No answer, left message with instructions and to return our call if there were any questions.  ?

## 2021-03-16 LAB — PTH-RELATED PEPTIDE: PTH-related peptide: <2 pmol/L

## 2021-03-17 ENCOUNTER — Encounter: Payer: Self-pay | Admitting: Licensed Clinical Social Worker

## 2021-03-17 ENCOUNTER — Other Ambulatory Visit: Payer: Self-pay | Admitting: Hospice and Palliative Medicine

## 2021-03-17 DIAGNOSIS — G894 Chronic pain syndrome: Secondary | ICD-10-CM | POA: Insufficient documentation

## 2021-03-17 DIAGNOSIS — Z79899 Other long term (current) drug therapy: Secondary | ICD-10-CM | POA: Insufficient documentation

## 2021-03-17 DIAGNOSIS — M899 Disorder of bone, unspecified: Secondary | ICD-10-CM | POA: Insufficient documentation

## 2021-03-17 DIAGNOSIS — Z515 Encounter for palliative care: Secondary | ICD-10-CM

## 2021-03-17 DIAGNOSIS — Z888 Allergy status to other drugs, medicaments and biological substances status: Secondary | ICD-10-CM | POA: Insufficient documentation

## 2021-03-17 DIAGNOSIS — T50905S Adverse effect of unspecified drugs, medicaments and biological substances, sequela: Secondary | ICD-10-CM | POA: Insufficient documentation

## 2021-03-17 DIAGNOSIS — Z79891 Long term (current) use of opiate analgesic: Secondary | ICD-10-CM | POA: Insufficient documentation

## 2021-03-17 DIAGNOSIS — Z789 Other specified health status: Secondary | ICD-10-CM | POA: Insufficient documentation

## 2021-03-17 DIAGNOSIS — F1190 Opioid use, unspecified, uncomplicated: Secondary | ICD-10-CM | POA: Insufficient documentation

## 2021-03-17 DIAGNOSIS — Z883 Allergy status to other anti-infective agents status: Secondary | ICD-10-CM | POA: Insufficient documentation

## 2021-03-17 MED ORDER — HYDROMORPHONE HCL 4 MG PO TABS: 4.0000 mg | ORAL_TABLET | ORAL | 0 refills | Status: DC | PRN

## 2021-03-17 NOTE — Progress Notes (Signed)
Sharon ?Clinical Social Work ? ?Clinical Social Work was referred by medical provider for assessment of psychosocial needs.  Clinical Social Worker contacted patient by phone  to offer support and assess for needs.  CSW left voicemail with contact information and request for return call. ? ? ?Royal Beirne, LCSW  ?Clinical Social Worker ?Louise ?      ?Second Attempt ?

## 2021-03-17 NOTE — Progress Notes (Unsigned)
Patient: Kaylee Romero  Service Category: E/M  Provider: Gaspar Cola, MD  DOB: September 09, 1980  DOS: 03/18/2021  Referring Provider: Irean Hong, NP  MRN: [id number]  Setting: Ambulatory outpatient  PCP: Kaylee Body, MD  Type: New Patient  Specialty: Interventional Pain Management    Location: Office  Delivery: Face-to-face     Primary Reason(s) for Visit: Encounter for initial evaluation of one or more chronic problems (new to examiner) potentially causing chronic pain, and posing a threat to normal musculoskeletal function. (Level of risk: High) CC: No chief complaint on file.  HPI  Kaylee Romero is a 41 y.o. year old, female patient, who comes for the first time to our practice referred by Borders, Kirt Boys, NP for our initial evaluation of her chronic pain. She has Overweight (BMI 25.0-29.9); ADD (attention deficit disorder); Low back pain; Pregnancy; Malignant neoplasm of upper-outer quadrant of right breast in female, estrogen receptor positive ([place]); Genetic testing; Port-A-Cath in place; Hx of breast surgery; Pleural effusion on left; Sepsis ([place]); Chest wall abscess; Visit for wound check; Essential hypertension; External hemorrhoids; History of breast cancer; Recurrent breast cancer, right ([place]); GAD (generalized anxiety disorder); Pulmonary nodule seen on imaging study; HCAP (healthcare-associated pneumonia); S/P bilateral salpingo-oophorectomy 02/17/21; Cancer-related pain; Acute dyspnea; Chest pain; NSTEMI (non-ST elevated myocardial infarction) ([place]); Hypercalcemia; Depression; Palliative care encounter; 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 Today she comes in for evaluation of her No chief complaint on file.  Pain Assessment: Location:     Radiating:   Onset:   Duration:   Quality:   Severity:  /10 (subjective, self-reported pain score)  Effect on ADL:   Timing:   Modifying factors:   BP:     HR:    Onset and  Duration: {Hx; Onset and Duration:210120511} Cause of pain: {Hx; Cause:210120521} Severity: {Pain Severity:210120502} Timing: {Symptoms; Timing:210120501} Aggravating Factors: {Causes; Aggravating pain factors:210120507} Alleviating Factors: {Causes; Alleviating Factors:210120500} Associated Problems: {Hx; Associated problems:210120515} Quality of Pain: {Hx; Symptom quality or Descriptor:210120531} Previous Examinations or Tests: {Hx; Previous examinations or test:210120529} Previous Treatments: {Hx; Previous Treatment:210120503}  ***  Today I took the time to provide the patient with information regarding my pain practice. The patient was informed that my practice is divided into two sections: an interventional pain management section, as well as a completely separate and distinct medication management section. I explained that I have procedure days for my interventional therapies, and evaluation days for follow-ups and medication management. Because of the amount of documentation required during both, they are kept separated. This means that there is the possibility that she may be scheduled for a procedure on one day, and medication management the next. I have also informed her that because of staffing and facility limitations, I no longer take patients for medication management only. To illustrate the reasons for this, I gave the patient the example of surgeons, and how inappropriate it would be to refer a patient to his/her care, just to write for the post-surgical antibiotics on a surgery done by a different surgeon.   Because interventional pain management is my board-certified specialty, the patient was informed that joining my practice means that they are open to any and all interventional therapies. I made it clear that this does not mean that they will be forced to have any procedures done. What this means is that I believe interventional therapies to be essential part of the diagnosis and  proper management of chronic pain conditions. Therefore,  patients not interested in these interventional alternatives will be better served under the care of a different practitioner.  The patient was also made aware of my Comprehensive Pain Management Safety Guidelines where by joining my practice, they limit all of their nerve blocks and joint injections to those done by our practice, for as long as we are retained to manage their care.   Historic Controlled Substance Pharmacotherapy Review  PMP and historical list of controlled substances: Hydromorphone 4 milligram tablet, 15/day; clonazepam 0.5 mg tablet 1 twice daily; morphine ER 60 mg tablet, 1 3 times daily; morphine ER 15 mg, 1 every 4 hours (6/day); dextroamphetamine ER 30 mg capsule 1 daily; oxycodone IR 5 mg tablet 1 4 times daily Current opioid analgesics:   Morphine ER 60 mg 3 times daily (MME 180 mg/day) + hydromorphone 4 mg, 2 tabs every 4 hours ( MME/day 240) (total MME equal 420 mg/day) MME/day: 420 mg/day  Historical Monitoring: The patient  reports no history of drug use. List of all UDS Test(s): No results found for: MDMA, COCAINSCRNUR, [place], [place], CANNABQUANT, THCU, [place] List of other Serum/Urine Drug Screening Test(s):  No results found for: AMPHSCRSER, BARBSCRSER, BENZOSCRSER, COCAINSCRSER, COCAINSCRNUR, PCPSCRSER, PCPQUANT, THCSCRSER, THCU, CANNABQUANT, OPIATESCRSER, OXYSCRSER, PROPOXSCRSER, ETH Historical Background Evaluation: [place] PMP: PDMP reviewed during this encounter. Online review of the past 67-month period conducted.             PMP NARX Score Report:  Narcotic: 603 Sedative: 451 Stimulant: 10 [place] Department of public safety, offender search: Editor, commissioning Information) Non-contributory Risk Assessment Profile: Aberrant behavior: None observed or detected today Risk factors for fatal opioid overdose: None identified today PMP NARX Overdose Risk Score: 630 Fatal overdose hazard ratio (HR): Calculation  deferred Non-fatal overdose hazard ratio (HR): Calculation deferred Risk of opioid abuse or dependence: 0.7-3.0% with doses ? 36 MME/day and 6.1-26% with doses ? 120 MME/day. Substance use disorder (SUD) risk level: See below Personal History of Substance Abuse (SUD-Substance use disorder):  Alcohol:    Illegal Drugs:    Rx Drugs:    ORT Risk Level calculation:    ORT Scoring interpretation table:  Score <3 = Low Risk for SUD  Score between 4-7 = Moderate Risk for SUD  Score >8 = High Risk for Opioid Abuse   PHQ-2 Depression Scale:  Total score:    PHQ-2 Scoring interpretation table: (Score and probability of major depressive disorder)  Score 0 = No depression  Score 1 = 15.4% Probability  Score 2 = 21.1% Probability  Score 3 = 38.4% Probability  Score 4 = 45.5% Probability  Score 5 = 56.4% Probability  Score 6 = 78.6% Probability   PHQ-9 Depression Scale:  Total score:    PHQ-9 Scoring interpretation table:  Score 0-4 = No depression  Score 5-9 = Mild depression  Score 10-14 = Moderate depression  Score 15-19 = Moderately severe depression  Score 20-27 = Severe depression (2.4 times higher risk of SUD and 2.89 times higher risk of overuse)   Pharmacologic Plan: As per protocol, I have not taken over any controlled substance management, pending the results of ordered tests and/or consults.            Initial impression: Pending review of available data and ordered tests.  Meds   Current Outpatient Medications:    acetaminophen (TYLENOL) 500 MG tablet, Take 1,000 mg by mouth every 6 (six) hours as needed., Disp: , Rfl:    albuterol (VENTOLIN HFA) 108 (90 Base) MCG/ACT inhaler,  INHALE 2 PUFFS INTO THE LUNGS EVERY 6 HOURS AS NEEDED FOR WHEEZING OR SHORTNESS OF BREATH (Patient taking differently: Inhale 2 puffs into the lungs 2 (two) times daily.), Disp: 6.7 g, Rfl: 2   amphetamine-dextroamphetamine (ADDERALL XR) 30 MG 24 hr capsule, Take 30 mg by mouth every morning., Disp: ,  Rfl:    azithromycin (ZITHROMAX) 500 MG tablet, Take 1 tablet (500 mg total) by mouth daily for 10 days., Disp: 10 tablet, Rfl: 0   Calcium Citrate-Vitamin D (CALCIUM + D PO), Take 1 tablet by mouth 3 (three) times daily., Disp: , Rfl:    capecitabine (XELODA) 500 MG tablet, TAKE 4 TABLETS BY MOUTH 2 TIMES DAILY AFTER A MEAL FOR 14 DAYS, THEN HOLD FOR 7 DAYS, REPEAT EVERY 21 DAYS., Disp: 112 tablet, Rfl: 0   Cholecalciferol (VITAMIN D3) 50 MCG (2000 UT) TABS, Take 1 tablet by mouth daily at 12 noon., Disp: , Rfl:    clonazePAM (KLONOPIN) 0.5 MG tablet, Take 0.5-1 tablets (0.25-0.5 mg total) by mouth 2 (two) times daily as needed for anxiety., Disp: 30 tablet, Rfl: 0   dexamethasone (DECADRON) 4 MG tablet, Take 1 tablet (4 mg total) by mouth daily for 7 days., Disp: 7 tablet, Rfl: 0   docusate sodium (COLACE) 100 MG capsule, Take 1 capsule (100 mg total) by mouth 2 (two) times daily as needed for mild constipation., Disp: 20 capsule, Rfl: 0   furosemide (LASIX) 20 MG tablet, Take 1 tablet (20 mg total) by mouth daily., Disp: 30 tablet, Rfl: 1   HYDROmorphone (DILAUDID) 4 MG tablet, Take 1-2 tablets (4-8 mg total) by mouth every 4 (four) hours as needed for severe pain (breakthrough pain)., Disp: 60 tablet, Rfl: 0   ibuprofen (ADVIL) 200 MG tablet, Take 400 mg by mouth every 6 (six) hours as needed for mild pain., Disp: , Rfl:    ipratropium-albuterol (DUONEB) 0.5-2.5 (3) MG/3ML SOLN, Take 3 mLs by nebulization every 4 (four) hours as needed. (Patient not taking: Reported on 03/09/2021), Disp: 360 mL, Rfl: 0   metoprolol tartrate (LOPRESSOR) 25 MG tablet, Take 1 tablet (25 mg total) by mouth 2 (two) times daily., Disp: 60 tablet, Rfl: 1   morphine (MS CONTIN) 60 MG 12 hr tablet, Take 1 tablet (60 mg total) by mouth every 8 (eight) hours for 7 days., Disp: 21 tablet, Rfl: 0   ondansetron (ZOFRAN) 8 MG tablet, Take 1 tablet (8 mg total) by mouth every 8 (eight) hours as needed for nausea or vomiting., Disp:  30 tablet, Rfl: 1   polyethylene glycol (MIRALAX / GLYCOLAX) 17 g packet, Take 17 g by mouth daily. (Patient taking differently: Take 17 g by mouth daily as needed for mild constipation.), Disp: 14 each, Rfl: 0   promethazine (PHENERGAN) 25 MG tablet, Take 1 tablet (25 mg total) by mouth every 6 (six) hours as needed for nausea or vomiting., Disp: 30 tablet, Rfl: 2   Tetrahydrozoline HCl (VISINE OP), Place 1 drop into both eyes daily as needed (redness)., Disp: , Rfl:    venlafaxine XR (EFFEXOR-XR) 150 MG 24 hr capsule, TAKE 1 CAPSULE(150 MG) BY MOUTH DAILY WITH BREAKFAST, Disp: 30 capsule, Rfl: 1  Imaging Review  Thoracic Imaging: Thoracic MR w/wo contrast: Results for orders placed during the hospital encounter of 12/13/20 MR Thoracic Spine W Wo Contrast  Narrative CLINICAL DATA:  Initial evaluation for back pain, abnormal scan.  EXAM: MRI THORACIC WITHOUT AND WITH CONTRAST  TECHNIQUE: Multiplanar and multiecho pulse sequences of the  thoracic spine were obtained without and with intravenous contrast.  CONTRAST:  10mL GADAVIST GADOBUTROL 1 MMOL/ML IV SOLN  COMPARISON:  Prior PET-CT from 11/18/2020.  FINDINGS: Alignment: Mild roto scoliosis. Alignment otherwise normal preservation of the normal thoracic kyphosis. No listhesis.  Vertebrae: 1 cm T1 hypointense enhancing lesion seen within the left aspect of the T9 vertebral Romero, consistent with an osseous metastasis (series 24, image 10). No extra osseous extension, pathologic fracture, or other complication. No other evidence for metastatic disease within the thoracic spine. Underlying bone marrow signal intensity within normal limits. Mild discogenic reactive endplate change noted about the T5-6 and T7-8 interspaces anteriorly due to scoliosis. No other abnormal marrow edema or enhancement.  Cord: Normal signal and morphology. No abnormal enhancement. No epidural tumor.  Paraspinal and other soft tissues: Paraspinous soft  tissues within normal limits. Irregular pleuroparenchymal thickening with adjacent architectural distortion within the visualized left lung, similar to prior exams, incompletely assessed on this study.  Disc levels:  Mild reactive endplate change about the T5-6 and T6-7 interspaces anteriorly. Otherwise, no other significant disc pathology seen within the thoracic spine. No disc bulge or focal disc herniation. No significant canal or foraminal stenosis or evidence for neural impingement.  IMPRESSION: 1. 1 cm osseous metastasis within the left aspect of the T9 vertebral Romero. No extra osseous extension, pathologic fracture, or other complication. 2. No other evidence for metastatic disease within the thoracic spine. 3. Irregular pleuroparenchymal thickening with adjacent architectural distortion within the visualized left lung, similar to prior exams.   Electronically Signed By: Jeannine Boga M.D. On: 12/14/2020 03:56  Ankle Imaging: Ankle-L DG Complete: Results for orders placed during the hospital encounter of 12/14/20 DG Ankle Complete Left  Narrative CLINICAL DATA:  Left ankle pain, swelling  EXAM: LEFT ANKLE COMPLETE - 3+ VIEW  COMPARISON:  None.  FINDINGS: Lateral soft tissue swelling. Chronic appearing bone fragments adjacent to the medial malleolus. No acute fracture, subluxation or dislocation. Soft tissues are intact.  IMPRESSION: Bone fragments adjacent to the medial malleolus, likely chronic/old fractures.  No acute bony abnormality.   Electronically Signed By: Rolm Baptise M.D. On: 12/14/2020 18:25  Hand Imaging: Hand-R DG Complete: Results for orders placed during the hospital encounter of 12/13/20 DG Hand Complete Right  Narrative CLINICAL DATA:  Hand pain and swelling  EXAM: RIGHT HAND - COMPLETE 3+ VIEW  COMPARISON:  None.  FINDINGS: No fracture or malalignment. Mild arthritis at the first [facility] joint. Soft tissues are  unremarkable  IMPRESSION: Mild arthritis at the first [facility] joint.   Electronically Signed By: Donavan Foil M.D. On: 12/13/2020 21:57  Complexity Note: Imaging results reviewed. Results shared with Ms. Jago, using Layman's terms.                        ROS  Cardiovascular: {Hx; Cardiovascular History:210120525} Pulmonary or Respiratory: {Hx; Pumonary and/or Respiratory History:210120523} Neurological: {Hx; Neurological:210120504} Psychological-Psychiatric: {Hx; Psychological-Psychiatric History:210120512} Gastrointestinal: {Hx; Gastrointestinal:210120527} Genitourinary: {Hx; Genitourinary:210120506} Hematological: {Hx; Hematological:210120510} Endocrine: {Hx; Endocrine history:210120509} Rheumatologic: {Hx; Rheumatological:210120530} Musculoskeletal: {Hx; Musculoskeletal:210120528} Work History: {Hx; Work history:210120514}  Allergies  Ms. Perot is allergic to betadine [povidone-iodine].  Laboratory Chemistry Profile   Renal Lab Results  Component Value Date   BUN 11 03/13/2021   CREATININE 0.58 03/13/2021   GFR 124.62 01/14/2012   GFRAA >60 08/07/2019   GFRNONAA >60 03/13/2021   SPECGRAV <=1.005 01/18/2012   PHUR 6.5 01/18/2012   PROTEINUR NEGATIVE 03/09/2021     Electrolytes  Lab Results  Component Value Date   NA 137 03/13/2021   K 3.6 03/13/2021   CL 99 03/13/2021   CALCIUM 9.7 03/13/2021   MG 1.8 03/13/2021     Hepatic Lab Results  Component Value Date   AST 95 (H) 03/09/2021   ALT 26 03/09/2021   ALBUMIN 3.5 03/09/2021   ALKPHOS 78 03/09/2021   LIPASE 22 03/09/2021     ID Lab Results  Component Value Date   HIV Non Reactive 08/10/2020   SARSCOV2NAA NEGATIVE 03/09/2021   STAPHAUREUS NEGATIVE 07/25/2020   MRSAPCR NEGATIVE 07/25/2020   PREGTESTUR NEGATIVE 02/23/2021     Bone Lab Results  Component Value Date   VD25OH 47.54 03/09/2021     Endocrine Lab Results  Component Value Date   GLUCOSE 103 (H) 03/13/2021   GLUCOSEU NEGATIVE  03/09/2021   HGBA1C 5.0 02/25/2021   TSH 0.290 (L) 02/25/2021     Neuropathy Lab Results  Component Value Date   VITAMINB12 1,339 (H) 01/14/2012   HGBA1C 5.0 02/25/2021   HIV Non Reactive 08/10/2020     CNS No results found for: COLORCSF, APPEARCSF, RBCCOUNTCSF, WBCCSF, POLYSCSF, LYMPHSCSF, EOSCSF, PROTEINCSF, GLUCCSF, JCVIRUS, CSFOLI, IGGCSF, LABACHR, ACETBL, LABACHR, ACETBL   Inflammation (CRP: Acute   ESR: Chronic) Lab Results  Component Value Date   CRP 1.0 (H) 03/10/2021   LATICACIDVEN 2.3 (HH) 03/09/2021     Rheumatology No results found for: RF, ANA, LABURIC, URICUR, LYMEIGGIGMAB, LYMEABIGMQN, HLAB27   Coagulation Lab Results  Component Value Date   INR 1.1 08/10/2020   LABPROT 14.5 08/10/2020   APTT 36 02/25/2021   PLT 240 03/13/2021   DDIMER 2.30 (H) 02/23/2021     Cardiovascular Lab Results  Component Value Date   BNP 35.6 02/23/2021   HGB 10.7 (L) 03/13/2021   HCT 31.1 (L) 03/13/2021     Screening Lab Results  Component Value Date   SARSCOV2NAA NEGATIVE 03/09/2021   STAPHAUREUS NEGATIVE 07/25/2020   MRSAPCR NEGATIVE 07/25/2020   HIV Non Reactive 08/10/2020   PREGTESTUR NEGATIVE 02/23/2021     Cancer No results found for: CEA, CA125, LABCA2   Allergens No results found for: ALMOND, APPLE, ASPARAGUS, AVOCADO, BANANA, BARLEY, BASIL, B[place]LE[place], GREENBEAN, LIMABEAN, WHITEBEAN, BEEFIGE, REDBEET, BLUEBERRY, BROCCOLI, CABBAGE, MELON, CARROT, CASEIN, CASHEWNUT, CAULIFLOWER, CELERY     Note: Lab results reviewed.  PFSH  Drug: Ms. Blackham  reports no history of drug use. Alcohol:  reports current alcohol use. Tobacco:  reports that she quit smoking about 4 years ago. Her smoking use included cigarettes. She started smoking about 19 years ago. She has a 10.00 pack-year smoking history. She has never used smokeless tobacco. Medical:  has a past medical history of Chronic constipation, Chronic narcotic use, Chronic nausea, Family history of breast cancer, GAD  (generalized anxiety disorder), History of pregnancy induced hypertension, HSV (herpes simplex virus) anogenital infection, Lung metastasis ([place]) (07/2020), Malignant neoplasm of upper-outer quadrant of right breast in female, estrogen receptor positive ([place]), and SOB (shortness of breath). Family: family history includes Alcohol abuse in her maternal grandfather and paternal grandfather; Breast cancer in an other family member; Heart disease in her maternal grandmother and paternal grandfather; Hypertension in her father.  Past Surgical History:  Procedure Laterality Date   ABDOMINAL HYSTERECTOMY N/A 02/17/2021   BREAST LUMPECTOMY WITH AXILLARY LYMPH NODE BIOPSY Right 11/21/2018   Procedure: RIGHT BREAST LUMPECTOMY WITH SENTINEL LYMPH NODE BIOPSY;  Surgeon: Jovita Kussmaul, MD;  Location: [place];  Service: General;  Laterality: Right;  BREAST REDUCTION SURGERY Bilateral 11/21/2018   Procedure: BILATERAL MAMMARY REDUCTION  (BREAST);  Surgeon: Wallace Going, DO;  Location: [place];  Service: Plastics;  Laterality: Bilateral;   CHEST TUBE INSERTION Left 07/29/2020   Procedure: INSERTION PLEURAL DRAINAGE CATHETER;  Surgeon: Lajuana Matte, MD;  Location: [place];  Service: Thoracic;  Laterality: Left;   IRRIGATION AND DEBRIDEMENT STERNOCLAVICULAR JOINT-STERNUM AND RIBS N/A 08/13/2020   Procedure: IRRIGATION AND DEBRIDEMENT CHEST WALL ABSCESS;  Surgeon: Lajuana Matte, MD;  Location: [place] [facility];  Service: Cardiothoracic;  Laterality: N/A;   IUD REMOVAL N/A 02/17/2021   Procedure: INTRAUTERINE DEVICE (IUD) REMOVAL;  Surgeon: Linda Hedges, DO;  Location: [place];  Service: Gynecology;  Laterality: N/A;   LAPAROSCOPIC SALPINGO OOPHERECTOMY Bilateral 02/17/2021   Procedure: LAPAROSCOPIC BILATERAL SALPINGO OOPHORECTOMY;  Surgeon: Linda Hedges, DO;  Location: [place];  Service: Gynecology;  Laterality: Bilateral;   LEFT HEART CATH AND CORONARY ANGIOGRAPHY N/A  02/25/2021   Procedure: LEFT HEART CATH AND CORONARY ANGIOGRAPHY possible PCI and stent;  Surgeon: Yolonda Kida, MD;  Location: [place] [facility];  Service: Cardiovascular;  Laterality: N/A;   PLEURAL BIOPSY Left 07/29/2020   Procedure: PLEURAL BIOPSY;  Surgeon: Lajuana Matte, MD;  Location: [place];  Service: Thoracic;  Laterality: Left;   PLEURAL EFFUSION DRAINAGE Left 07/29/2020   Procedure: DRAINAGE OF PLEURAL EFFUSION;  Surgeon: Lajuana Matte, MD;  Location: [place];  Service: Thoracic;  Laterality: Left;   PORT-A-CATH REMOVAL N/A 07/06/2019   Procedure: REMOVAL PORT-A-CATH;  Surgeon: Jovita Kussmaul, MD;  Location: [place];  Service: General;  Laterality: N/A;   PORTACATH PLACEMENT N/A 11/21/2018   Procedure: INSERTION LEFT PORT-A-CATH WITH ULTRASOUND GUIDANCE;  Surgeon: Jovita Kussmaul, MD;  Location: [place];  Service: General;  Laterality: N/A;   [place] Left 07/29/2020   Procedure: VIDEO ASSISTED THORACOSCOPY;  Surgeon: Lajuana Matte, MD;  Location: [place];  Service: Thoracic;  Laterality: Left;   WISDOM TOOTH EXTRACTION     Active Ambulatory Problems    Diagnosis Date Noted   Overweight (BMI 25.0-29.9) 01/14/2012   ADD (attention deficit disorder) 03/24/2012   Low back pain 01/14/2013   Pregnancy 06/18/2017   Malignant neoplasm of upper-outer quadrant of right breast in female, estrogen receptor positive ([place]) 09/29/2018   Genetic testing 10/13/2018   Port-A-Cath in place 02/02/2019   Hx of breast surgery 02/28/2019   Pleural effusion on left 07/29/2020   Sepsis ([place]) 08/09/2020   Chest wall abscess 08/13/2020   Visit for wound check 09/10/2020   Essential hypertension 03/06/2020   External hemorrhoids 11/11/2020   History of breast cancer 03/06/2020   Recurrent breast cancer, right ([place]) 11/11/2020   GAD (generalized anxiety disorder) 04/01/2020   Pulmonary nodule seen on imaging study  07/01/2020   HCAP (healthcare-associated pneumonia) 02/23/2021   S/P bilateral salpingo-oophorectomy 02/17/21 02/23/2021   Cancer-related pain 02/23/2021   Acute dyspnea 02/23/2021   Chest pain 02/24/2021   NSTEMI (non-ST elevated myocardial infarction) ([place]) 02/25/2021   Hypercalcemia 03/09/2021   Depression 03/09/2021   Palliative care encounter    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 In no apparent acute distress There were no vitals filed for this visit. BMI Assessment: Estimated Romero mass index is 30.07 kg/m as calculated from the following:   Height as of 03/09/21: $RemoveBe'5\' 3"'[id number]$  (1.6 m).   Weight as of 03/09/21: 169 lb 12.1 oz (77 kg).  BMI interpretation table: BMI level Category Range association with higher incidence of chronic pain  <18 kg/m2 Underweight   18.5-24.9 kg/m2 Ideal Romero weight   25-29.9 kg/m2 Overweight Increased incidence by 20%  30-34.9 kg/m2 Obese (Class I) Increased incidence by 68%  35-39.9 kg/m2 Severe obesity (Class II) Increased incidence by 136%  >40 kg/m2 Extreme obesity (Class III) Increased incidence by 254%   Patient's current BMI Ideal Romero weight  There is no height or weight on file to calculate BMI. Ideal Romero weight: 52.4 kg (115 lb 8.3 oz) Adjusted ideal Romero weight: 62.2 kg (137 lb 3.4 oz)   BMI Readings from Last 4 Encounters:  03/09/21 30.07 kg/m  02/27/21 30.36 kg/m  02/25/21 28.90 kg/m  02/17/21 29.18 kg/m    Wt Readings from Last 4 Encounters:  03/09/21 169 lb 12.1 oz (77 kg)  02/27/21 171 lb 6.4 oz (77.7 kg)  02/25/21 163 lb 2.3 oz (74 kg)  02/17/21 164 lb 11.2 oz (74.7 kg)    Psych/Mental status: Alert, oriented x 3 (person, place, & time)       Eyes: PERLA Respiratory: No evidence of acute respiratory distress  Assessment  Primary Diagnosis & Pertinent Problem List: The primary encounter diagnosis was Chronic pain syndrome. Diagnoses of Pharmacologic therapy, Disorder of skeletal system, Problems influencing health status, Chronic use of opiate for therapeutic purpose, Physical tolerance to opiate drug, and Drug tolerance, sequela were also pertinent to this visit.  Visit Diagnosis (New problems to examiner): 1. Chronic pain syndrome   2. Pharmacologic therapy   3. Disorder of skeletal system   4. Problems influencing health status   5. Chronic use of opiate for therapeutic purpose   6. 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 Drug tolerance, sequela    Plan of Care (Initial workup plan)  Note: Ms. Sandall was reminded that as per protocol, today's visit has been an evaluation only. We have not taken over the patient's controlled substance management.  Problem-specific plan: No problem-specific Assessment & Plan notes found for this encounter.  Lab Orders  No laboratory test(s) ordered today   Imaging Orders  No imaging studies ordered today   Referral Orders  No referral(s) requested today   Procedure Orders    No procedure(s) ordered today   Pharmacotherapy (current): Medications ordered:  No orders of the defined types were placed in this encounter.  Medications administered during this visit: Anona B. Cuadras had no medications administered during this visit.   Pharmacological management options:  Opioid Analgesics: The patient was informed that there is no guarantee that she would be a candidate for opioid analgesics. The decision will be made following CDC  guidelines. This decision will be based on the results of diagnostic studies, as well as Ms. Niznik's risk profile.   Membrane stabilizer: To be determined at a later time  Muscle relaxant: To be determined at a later time  NSAID:  To be determined at a later time  Other analgesic(s): To be determined at a later time   Interventional management options: Ms. Pierro was informed that there is no guarantee that she would be a candidate for interventional therapies. The decision will be based on the results of diagnostic studies, as well as Ms. Cafarelli's risk profile.  Procedure(s) under consideration:  Pending results of ordered studies      Interventional Therapies  Risk   Complexity Considerations:   Estimated Romero mass index is 30.07 kg/m as calculated from the following:   Height as of 03/09/21: $RemoveBe'5\' 3"'taDkjjrRU$  (1.6 m).   Weight as of 03/09/21: 169 lb 12.1 oz (77 kg). WNL   Planned   Pending:   Pending further evaluation   Under consideration:   ***   Completed:   None at this time   Therapeutic   Palliative (PRN) options:   None established      Provider-requested follow-up: No follow-ups on file.  Future Appointments  Date Time Provider [place]  03/18/2021 11:20 AM Milinda Pointer, MD [facility] None  03/20/2021 10:00 AM Noreene Filbert, MD [facility] None  03/20/2021 10:45 AM [facility] None  03/20/2021 11:00 AM Lloyd Huger, MD [facility] None  03/20/2021 11:15 AM Darl Pikes, RPH-CPP [facility] None  03/20/2021 12:00 PM Jennet Maduro, RD [facility] None    Note by: Gaspar Cola, MD Date: 03/18/2021; Time: 3:33 PM

## 2021-03-18 ENCOUNTER — Ambulatory Visit: Payer: BC Managed Care – PPO | Attending: Pain Medicine | Admitting: Pain Medicine

## 2021-03-18 ENCOUNTER — Encounter: Payer: Self-pay | Admitting: Pain Medicine

## 2021-03-18 ENCOUNTER — Other Ambulatory Visit: Payer: Self-pay

## 2021-03-18 VITALS — BP 122/76 | HR 125 | Temp 97.2°F | Resp 18 | Ht 64.0 in | Wt 160.0 lb

## 2021-03-18 DIAGNOSIS — Z883 Allergy status to other anti-infective agents status: Secondary | ICD-10-CM

## 2021-03-18 DIAGNOSIS — M546 Pain in thoracic spine: Secondary | ICD-10-CM | POA: Insufficient documentation

## 2021-03-18 DIAGNOSIS — Z789 Other specified health status: Secondary | ICD-10-CM

## 2021-03-18 DIAGNOSIS — C7951 Secondary malignant neoplasm of bone: Secondary | ICD-10-CM | POA: Insufficient documentation

## 2021-03-18 DIAGNOSIS — M4694 Unspecified inflammatory spondylopathy, thoracic region: Secondary | ICD-10-CM

## 2021-03-18 DIAGNOSIS — G893 Neoplasm related pain (acute) (chronic): Secondary | ICD-10-CM

## 2021-03-18 DIAGNOSIS — F1190 Opioid use, unspecified, uncomplicated: Secondary | ICD-10-CM

## 2021-03-18 DIAGNOSIS — Z79891 Long term (current) use of opiate analgesic: Secondary | ICD-10-CM

## 2021-03-18 DIAGNOSIS — M899 Disorder of bone, unspecified: Secondary | ICD-10-CM | POA: Diagnosis present

## 2021-03-18 DIAGNOSIS — G894 Chronic pain syndrome: Secondary | ICD-10-CM | POA: Diagnosis present

## 2021-03-18 DIAGNOSIS — G8929 Other chronic pain: Secondary | ICD-10-CM | POA: Diagnosis present

## 2021-03-18 DIAGNOSIS — Z888 Allergy status to other drugs, medicaments and biological substances status: Secondary | ICD-10-CM | POA: Diagnosis present

## 2021-03-18 DIAGNOSIS — T50905S Adverse effect of unspecified drugs, medicaments and biological substances, sequela: Secondary | ICD-10-CM | POA: Diagnosis present

## 2021-03-18 DIAGNOSIS — Z79899 Other long term (current) drug therapy: Secondary | ICD-10-CM | POA: Diagnosis present

## 2021-03-18 DIAGNOSIS — C50411 Malignant neoplasm of upper-outer quadrant of right female breast: Secondary | ICD-10-CM | POA: Diagnosis present

## 2021-03-18 DIAGNOSIS — M5134 Other intervertebral disc degeneration, thoracic region: Secondary | ICD-10-CM

## 2021-03-18 DIAGNOSIS — M5414 Radiculopathy, thoracic region: Secondary | ICD-10-CM

## 2021-03-18 DIAGNOSIS — C50911 Malignant neoplasm of unspecified site of right female breast: Secondary | ICD-10-CM

## 2021-03-18 DIAGNOSIS — Z170 Estrogen receptor positive status [ER+]: Secondary | ICD-10-CM

## 2021-03-18 NOTE — Patient Instructions (Addendum)
____________________________________________________________________________________________ ? ?CBD (cannabidiol) & Delta-8 (Delta-8 tetrahydrocannabinol) WARNING ? ?Intro: Cannabidiol (CBD) and tetrahydrocannabinol (THC), are two natural compounds found in plants of the Cannabis genus. They can both be extracted from hemp or cannabis. Hemp and cannabis come from the Cannabis sativa plant. Both compounds interact with your body?s endocannabinoid system, but they have very different effects. CBD does not produce the high sensation associated with cannabis. Delta-8 tetrahydrocannabinol, also known as delta-8 THC, is a psychoactive substance found in the Cannabis sativa plant, of which marijuana and hemp are two varieties. THC is responsible for the high associated with the illicit use of marijuana. ? ?Applicable to: All individuals currently taking or considering taking CBD (cannabidiol) and, more important, all patients taking opioid analgesic controlled substances (pain medication). (Example: oxycodone; oxymorphone; hydrocodone; hydromorphone; morphine; methadone; tramadol; tapentadol; fentanyl; buprenorphine; butorphanol; dextromethorphan; meperidine; codeine; etc.) ? ?Legal status: CBD remains a Schedule I drug prohibited for any use. CBD is illegal with one exception. In the [place], CBD has a limited Transport planner (FDA) approval for the treatment of two specific types of epilepsy disorders. Only one CBD product has been approved by the FDA for this purpose: "Epidiolex". FDA is aware that some companies are marketing products containing cannabis and cannabis-derived compounds in ways that violate the [employer], Drug and Cosmetic Act [facility] Act) and that may put the health and safety of consumers at risk. The FDA, a Federal agency, has not enforced the CBD status since 2018. UPDATE: (02/21/2021) The Drug Enforcement Agency ([place]) issued a letter stating that "delta" cannabinoids, including  Delta-8-THCO and Delta-9-THCO, synthetically derived from hemp do not qualify as hemp and will be viewed as Schedule I drugs. (Schedule I drugs, substances, or chemicals are defined as drugs with no currently accepted medical use and a high potential for abuse. Some examples of Schedule I drugs are: heroin, lysergic acid diethylamide (LSD), marijuana (cannabis), 3,4-methylenedioxymethamphetamine (ecstasy), methaqualone, and peyote.) (https://jennings.com/) ? ?Legality: Some manufacturers ship CBD products nationally, which is illegal. Often such products are sold online and are therefore available throughout the country. CBD is openly sold in head shops and health food stores in some states where such sales have not been explicitly legalized. Selling unapproved products with unsubstantiated therapeutic claims is not only a violation of the law, but also can put patients at risk, as these products have not been proven to be safe or effective. Federal illegality makes it difficult to conduct research on CBD. ? ?Reference: "FDA Regulation of Cannabis and Cannabis-Derived Products, Including Cannabidiol (CBD)" - SeekArtists.com.pt ? ?Warning: CBD is not FDA approved and has not undergo the same manufacturing controls as prescription drugs.  This means that the purity and safety of available CBD may be questionable. Most of the time, despite manufacturer's claims, it is contaminated with THC (delta-9-tetrahydrocannabinol - the chemical in marijuana responsible for the "HIGH").  When this is the case, the [facility] contaminant will trigger a positive urine drug screen (UDS) test for Marijuana (carboxy-THC). Because a positive UDS for any illicit substance is a violation of our medication agreement, your opioid analgesics (pain medicine) may be permanently discontinued. ?The FDA recently put out a warning about 5 things  that everyone should be aware of regarding Delta-8 THC: ?Delta-8 THC products have not been evaluated or approved by the FDA for safe use and may be marketed in ways that put the public health at risk. ?The FDA has received adverse event reports involving delta-8 THC-containing products. ?Delta-8 THC has psychoactive  and intoxicating effects. ?Delta-8 THC manufacturing often involve use of potentially harmful chemicals to create the concentrations of delta-8 THC claimed in the marketplace. The final delta-8 THC product may have potentially harmful by-products (contaminants) due to the chemicals used in the process. Manufacturing of delta-8 THC products may occur in uncontrolled or unsanitary settings, which may lead to the presence of unsafe contaminants or other potentially harmful substances. ?Delta-8 THC products should be kept out of the reach of children and pets. ? ?MORE ABOUT CBD ? ?General Information: CBD was discovered in 1940 and it is a derivative of the cannabis sativa genus plants (Marijuana and Hemp). It is one of the 113 identified substances found in Marijuana. It accounts for up to 40% of the plant's extract. As of 2018, preliminary clinical studies on CBD included research for the treatment of anxiety, movement disorders, and pain. CBD is available and consumed in multiple forms, including inhalation of smoke or vapor, as an aerosol spray, and by mouth. It may be supplied as an oil containing CBD, capsules, dried cannabis, or as a liquid solution. CBD is thought not to be as psychoactive as THC (delta-9-tetrahydrocannabinol - the chemical in marijuana responsible for the "HIGH"). Studies suggest that CBD may interact with different biological target receptors in the body, including cannabinoid and other neurotransmitter receptors. As of 2018 the mechanism of action for its biological effects has not been determined. ? ?Side-effects  Adverse reactions: Dry mouth, diarrhea, decreased appetite,  fatigue, drowsiness, malaise, weakness, sleep disturbances, and others. ? ?Drug interactions: CBC may interact with other medications such as blood-thinners. Because CBD causes drowsiness on its own, it also increases the drowsiness caused by other medications, including antihistamines (such as Benadryl), benzodiazepines (Xanax, Ativan, Valium), antipsychotics, antidepressants and opioids, as well as alcohol and supplements such as kava, melatonin and St. John's Wort. Be cautious with the following combinations:  ? ?Brivaracetam (Briviact) ?Brivaracetam is changed and broken down by the body. CBD might decrease how quickly the body breaks down brivaracetam. This might increase levels of brivaracetam in the body. ? ?Caffeine ?Caffeine is changed and broken down by the body. CBD might decrease how quickly the body breaks down caffeine. This might increase levels of caffeine in the body. ? ?Carbamazepine (Tegretol) ?Carbamazepine is changed and broken down by the body. CBD might decrease how quickly the body breaks down carbamazepine. This might increase levels of carbamazepine in the body and increase its side effects. ? ?Citalopram (Celexa) ?Citalopram is changed and broken down by the body. CBD might decrease how quickly the body breaks down citalopram. This might increase levels of citalopram in the body and increase its side effects. ? ?Clobazam (Onfi) ?Clobazam is changed and broken down by the liver. CBD might decrease how quickly the liver breaks down clobazam. This might increase the effects and side effects of clobazam. ? ?Eslicarbazepine (Aptiom) ?Eslicarbazepine is changed and broken down by the body. CBD might decrease how quickly the body breaks down eslicarbazepine. This might increase levels of eslicarbazepine in the body by a small amount. ? ?Everolimus (Zostress) ?Everolimus is changed and broken down by the body. CBD might decrease how quickly the body breaks down everolimus. This might increase  levels of everolimus in the body. ? ?Lithium ?Taking higher doses of CBD might increase levels of lithium. This can increase the risk of lithium toxicity. ? ?Medications changed by the liver (Cytochrome P450 1A

## 2021-03-18 NOTE — Progress Notes (Signed)
Safety precautions to be maintained throughout the outpatient stay will include: orient to surroundings, keep bed in low position, maintain call bell within reach at all times, provide assistance with transfer out of bed and ambulation.  

## 2021-03-20 ENCOUNTER — Ambulatory Visit
Admission: RE | Admit: 2021-03-20 | Discharge: 2021-03-20 | Disposition: A | Discharge status: Home / Self Care | Payer: BC Managed Care – PPO | Source: Ambulatory Visit | Attending: Radiation Oncology | Admitting: Radiation Oncology

## 2021-03-20 ENCOUNTER — Inpatient Hospital Stay: Payer: BC Managed Care – PPO

## 2021-03-20 ENCOUNTER — Other Ambulatory Visit: Payer: Self-pay

## 2021-03-20 ENCOUNTER — Inpatient Hospital Stay (HOSPITAL_BASED_OUTPATIENT_CLINIC_OR_DEPARTMENT_OTHER): Payer: BC Managed Care – PPO | Admitting: Oncology

## 2021-03-20 ENCOUNTER — Inpatient Hospital Stay: Payer: BC Managed Care – PPO | Admitting: Pharmacist

## 2021-03-20 VITALS — BP 147/84 | HR 119 | Temp 97.3°F | Resp 16 | Ht 64.0 in | Wt 159.4 lb

## 2021-03-20 VITALS — HR 120 | Temp 97.0°F

## 2021-03-20 DIAGNOSIS — C7801 Secondary malignant neoplasm of right lung: Secondary | ICD-10-CM | POA: Diagnosis not present

## 2021-03-20 DIAGNOSIS — C7951 Secondary malignant neoplasm of bone: Secondary | ICD-10-CM | POA: Insufficient documentation

## 2021-03-20 DIAGNOSIS — C7802 Secondary malignant neoplasm of left lung: Secondary | ICD-10-CM | POA: Diagnosis not present

## 2021-03-20 DIAGNOSIS — R1310 Dysphagia, unspecified: Secondary | ICD-10-CM | POA: Diagnosis not present

## 2021-03-20 DIAGNOSIS — Z170 Estrogen receptor positive status [ER+]: Secondary | ICD-10-CM

## 2021-03-20 DIAGNOSIS — C782 Secondary malignant neoplasm of pleura: Secondary | ICD-10-CM | POA: Diagnosis not present

## 2021-03-20 DIAGNOSIS — G893 Neoplasm related pain (acute) (chronic): Secondary | ICD-10-CM | POA: Diagnosis not present

## 2021-03-20 DIAGNOSIS — C50411 Malignant neoplasm of upper-outer quadrant of right female breast: Secondary | ICD-10-CM | POA: Insufficient documentation

## 2021-03-20 DIAGNOSIS — Z923 Personal history of irradiation: Secondary | ICD-10-CM | POA: Diagnosis not present

## 2021-03-20 DIAGNOSIS — D72829 Elevated white blood cell count, unspecified: Secondary | ICD-10-CM | POA: Diagnosis not present

## 2021-03-20 DIAGNOSIS — C787 Secondary malignant neoplasm of liver and intrahepatic bile duct: Secondary | ICD-10-CM | POA: Diagnosis not present

## 2021-03-20 LAB — CBC WITH DIFFERENTIAL/PLATELET
Abs Immature Granulocytes: 0.31 10*3/uL — ABNORMAL HIGH (ref 0.00–0.07)
Basophils Absolute: 0.1 10*3/uL (ref 0.0–0.1)
Basophils Relative: 0 %
Eosinophils Absolute: 0.1 10*3/uL (ref 0.0–0.5)
Eosinophils Relative: 1 %
HCT: 35.6 % — ABNORMAL LOW (ref 36.0–46.0)
Hemoglobin: 11.9 g/dL — ABNORMAL LOW (ref 12.0–15.0)
Immature Granulocytes: 2 %
Lymphocytes Relative: 5 %
Lymphs Abs: 1 10*3/uL (ref 0.7–4.0)
MCH: 33.4 pg (ref 26.0–34.0)
MCHC: 33.4 g/dL (ref 30.0–36.0)
MCV: 100 fL (ref 80.0–100.0)
Monocytes Absolute: 1.2 10*3/uL — ABNORMAL HIGH (ref 0.1–1.0)
Monocytes Relative: 6 %
Neutro Abs: 17.2 10*3/uL — ABNORMAL HIGH (ref 1.7–7.7)
Neutrophils Relative %: 86 %
Platelets: 251 10*3/uL (ref 150–400)
RBC: 3.56 MIL/uL — ABNORMAL LOW (ref 3.87–5.11)
RDW: 15.7 % — ABNORMAL HIGH (ref 11.5–15.5)
WBC: 19.8 10*3/uL — ABNORMAL HIGH (ref 4.0–10.5)
nRBC: 0 % (ref 0.0–0.2)

## 2021-03-20 LAB — COMPREHENSIVE METABOLIC PANEL
ALT: 51 U/L — ABNORMAL HIGH (ref 0–44)
AST: 168 U/L — ABNORMAL HIGH (ref 15–41)
Albumin: 3.2 g/dL — ABNORMAL LOW (ref 3.5–5.0)
Alkaline Phosphatase: 93 U/L (ref 38–126)
Anion gap: 11 (ref 5–15)
BUN: 19 mg/dL (ref 6–20)
CO2: 33 mmol/L — ABNORMAL HIGH (ref 22–32)
Calcium: 12.5 mg/dL — ABNORMAL HIGH (ref 8.9–10.3)
Chloride: 93 mmol/L — ABNORMAL LOW (ref 98–111)
Creatinine, Ser: 0.71 mg/dL (ref 0.44–1.00)
GFR, Estimated: >60 mL/min (ref >60)
Glucose, Bld: 114 mg/dL — ABNORMAL HIGH (ref 70–99)
Potassium: 3.3 mmol/L — ABNORMAL LOW (ref 3.5–5.1)
Sodium: 137 mmol/L (ref 135–145)
Total Bilirubin: 0.2 mg/dL — ABNORMAL LOW (ref 0.3–1.2)
Total Protein: 6.7 g/dL (ref 6.5–8.1)

## 2021-03-20 MED ORDER — ZOLEDRONIC ACID 4 MG/100ML IV SOLN
4.0000 mg | Freq: Once | INTRAVENOUS | Status: AC
  Administered 2021-03-20: 4 mg via INTRAVENOUS
  Filled 2021-03-20: qty 100

## 2021-03-20 MED ORDER — SODIUM CHLORIDE 0.9 % IV SOLN
Freq: Once | INTRAVENOUS | Status: AC
  Filled 2021-03-20: qty 250

## 2021-03-20 MED ORDER — CAPECITABINE 500 MG PO TABS: ORAL_TABLET | ORAL | 0 refills | Status: AC

## 2021-03-20 NOTE — Progress Notes (Signed)
Nutrition Follow-up: ? 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  Patient initially diagnosed in Nov 2020, s/p right lumpectomy with adjuvant chemotherapy/radiation and recurrence while on tamoxifen.  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  S/p BSO on 02/17/21.  Patient on third line chemotherapy for breast cancer (xeloda), s/p radiation to spine/ribs.  Palliative care following.  Patient being seen at pain clinic.  Noted recent hospital admission 2/19-2/22 for pneumonia, sepsis and NSTEMI and 3/5-3/9 for pneumonia, hypercalcemia, pain.   ? ?Met with patient and brother in exam room.  Patient reports trouble swallowing for the past few weeks.  Is not any worse with any particular kind of food.  Still able to eat. Says that sometimes she gets choked.  Liked all oral nutrition supplements but really like the juice shake (ensure clear).  Says that appetite is not good. Drank an ensure yesterday and ate a Frosty.  For dinner able to eat about 1/2 of chicken and salad from [employer].  Some nausea but anticipates more nausea with starting next cycle of xeloda.    ? ? ? ?Medications: reviewed ? ?Labs: reviewed ? ?Anthropometrics:  ? ?Weight 159 lb 6.4 oz today ? ?171 lb 6.4 oz on 2/23 ?163 lb on 2/21 ?170 lb on 1/10 ?168 lb on 12/10 ?167 lb on 11/3 ?168 lb on 10/6 ? ? ?NUTRITION DIAGNOSIS: Inadequate oral intake continues ? ? ? ?INTERVENTION:  ?Discussed evaluation by SLP with MD and referral placed. ?Encouraged patient to take nausea medication over the next few days as she knows nausea is worse on the first few days of treatment ?Encouraged snacks/sips/nibbles q 1-2 hours ?  ? ?MONITORING, EVALUATION, GOAL: weight trends, intake ? ? ?NEXT VISIT: Thursday, April 6  ? ?Numa Heatwole B. Zenia Resides, RD, LDN ?Registered Dietitian ?[phone] (mobile) ? ? ?

## 2021-03-20 NOTE — Progress Notes (Signed)
? ?[facility] ?[place]  ?[phone] [phone] ? ?Patient Care Team: ?Dion Body, MD as PCP - General (Family Medicine) ?Gery Pray, MD as Consulting Physician (Radiation Oncology) ?Jovita Kussmaul, MD as Consulting Physician (General Surgery) ?Dillingham, Loel Lofty, DO as Attending Physician (Plastic Surgery) ?Lloyd Huger, MD as Consulting Physician (Oncology)  ? ?Name of the patient: Kaylee Romero  ?[id number]  ?[birth year]  ? ?Date of visit: 03/20/21 ? ?HPI: Patient is a 41 y.o. female with progressive metastatic breast cancer, ER/PR positive, HER2 negative. Initially treated with Palbociclib, started on 08/15/20. CT on 01/27/21 showed progressive disease. Palbociclib was discontinued and patient started treatment with capecitabine the beginning of February 2023. Patient underwent TAH/BSO on 02/17/21.  ? ?Capecitabine therapy history: ?Cycle 1- admitted during her off week for pneumonia ?Cycle 2- admitted on ~day 10 for pneumonia, cycle held and not completed ?Cycle 3- to be started tomorrow 03/21/21 ? 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  ? ?Reason for Consult: Oral chemotherapy follow-up for capecitabine therapy. ? ? ?PAST MEDICAL HISTORY: ?Past Medical History:  ?Diagnosis Date  ? Chronic constipation   ? Chronic narcotic use   ? Chronic nausea   ? due to taking chemo drug  ? Family history of breast cancer   ? GAD (generalized anxiety disorder)   ? History of pregnancy induced hypertension   ? HSV (herpes simplex virus) anogenital infection   ? positive titer only  ? 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 Malignant neoplasm of upper-outer quadrant of right breast in female, estrogen receptor positive ([place])   ? oncologist--- dr Grayland Ormond;;  first dx 09/ 2020 s/p right lumpectomy w/  node dissection and completed chemoradiation 08/ 2021;;  recurrent 07/ 2022 to lung pleura,liver, and thoracic spine  ? SOB (shortness of breath)   ? 02-12-2021  pt denies with regular activity but sob with stairs  ? ? ?HEMATOLOGY/ONCOLOGY HISTORY:  ?Oncology History  ?Malignant neoplasm of upper-outer quadrant of right breast in female, estrogen receptor positive ([place])  ?09/29/2018 Initial Diagnosis  ? Malignant neoplasm of upper-outer quadrant of right breast in female, estrogen receptor positive ([place]) ?  ?09/2018 -  Anti-estrogen oral therapy  ? Tamoxifen; held from 12/05/2018-09/06/2019 for surgery ?  ?10/05/2018 Cancer Staging  ? Staging form: Breast, AJCC 8th Edition ?- Clinical stage from 10/05/2018: Stage IB (cT2, cN0, cM0, G2, ER+, PR+, HER2-) - Signed by Chauncey Cruel, MD on 04/02/2020 ?Stage prefix: Initial diagnosis ?Histologic grading system: 3 grade system ? ?  ?10/13/2018 Genetic Testing  ? Negative genetic testing. No pathogenic variants identified on the [facility] Breast Cancer STAT Panel + Common Hereditary Cancers Panel. The Common Hereditary Cancers Panel offered by [facility] includes sequencing and/or deletion duplication testing of the following 47 genes: APC, ATM, AXIN2, BARD1, BMPR1A, BRCA1, BRCA2, BRIP1, CDH1, CDKN2A (p14ARF), CDKN2A (p16INK4a), CKD4, CHEK2, CTNNA1, DICER1, EPCAM (Deletion/duplication testing only), GREM1 (promoter region deletion/duplication testing only), KIT, MEN1, MLH1, MSH2, MSH3, MSH6, MUTYH, NBN, NF1, NHTL1, PALB2, PDGFRA, PMS2, POLD1, POLE, PTEN, RAD50, RAD51C, RAD51D, SDHB, SDHC, SDHD, SMAD4, SMARCA4. STK11, TP53, TSC1, TSC2, and VHL.  The following genes were evaluated for sequence changes only: SDHA and HOXB13 c.251G>A variant only. The report date is 10/13/2018.  ?  ?11/21/2018 Surgery  ? Right lumpectomy [name]) [phone]): IDC, grade 2, 2.6 cm,  with DCIS. Negative margins. 1/4 lymph nodes positive for macrometastasis. ER/PR positive, HER-2 negative. ?   ?12/26/2018 - 06/01/2019 Chemotherapy  ? dexamethasone (DECADRON) 4 MG tablet, 1 of 1 cycle, Start date: 12/05/2018, End date: 03/02/2019 ? ?DOXOrubicin (ADRIAMYCIN) chemo injection 114 mg, 60 mg/m2 = 114 mg, Intravenous,  Once, 4 of 4 cycles. Administration: 114 mg (01/05/2019), 114 mg (01/19/2019), 114 mg (02/02/2019), 114 mg (02/15/2019) ? ?palonosetron (ALOXI) injection 0.25 mg, 0.25 mg, Intravenous,  Once, 1 of 1 cycle. Administration: 0.25 mg (01/05/2019) ? ?pegfilgrastim (NEULASTA ONPRO KIT) injection 6 mg, 6 mg, Subcutaneous, Once, 2 of 2 cycles ? ?pegfilgrastim-jmdb (FULPHILA) injection 6 mg, 6 mg, Subcutaneous,  Once, 4 of 4 cycles. Administration: 6 mg (01/07/2019), 6 mg (01/21/2019), 6 mg (02/06/2019), 6 mg (02/17/2019) ? ?cyclophosphamide (CYTOXAN) 1,140 mg in sodium chloride 0.9 % 250 mL chemo infusion, 600 mg/m2 = 1,140 mg, Intravenous,  Once, 4 of 4 cycles. Administration: 1,140 mg (01/05/2019), 1,140 mg (01/19/2019), 1,140 mg (02/02/2019), 1,140 mg (02/15/2019). ? ?PACLitaxel (TAXOL) 150 mg in sodium chloride 0.9 % 250 mL chemo infusion (</= $RemoveBefor'80mg'cKcOLoGfhLSf$ /m2), 80 mg/m2 = 150 mg, Intravenous,  Once, 12 of 12 cycles. Administration: 150 mg (03/02/2019), 150 mg (03/09/2019), 150 mg (03/23/2019), 150 mg (03/30/2019), 150 mg (04/06/2019), 150 mg (04/20/2019), 150 mg (04/27/2019), 150 mg (05/03/2019), 150 mg (05/11/2019), 150 mg (05/18/2019), 150 mg (05/25/2019), 150 mg (06/01/2019) ? ?fosaprepitant (EMEND) 150 mg ? ?dexamethasone (DECADRON) 12 mg in sodium chloride 0.9 % 145 mL IVPB, , Intravenous,  Once, 4 of 4 cycles. Administration:  (01/05/2019),  (01/19/2019),  (02/02/2019),  (02/15/2019). ?  ?06/21/2019 - 08/11/2019 Radiation Therapy  ? The patient initially received a dose of 50.4 Gy in 28 fractions to the breast using whole-breast tangent fields. This was delivered using a 3-D conformal technique. The pt received a boost delivering an additional 16 Gy in 8 fractions using a electron boost with 51meV electrons. The total dose was 66.4  Gy. ? ?  ?11/12/2019 Cancer Staging  ? Staging form: Breast, AJCC 8th Edition ?- Pathologic: Stage IB (pT2, pN1a, cM0, G2, ER+, PR+, HER2-) ? ?  ? Oncotype testing  ? MammaPrint high risk ?  ? ? ?ALLERGIES:  is allergic to betadine [povidone-iodine]. ? ?MEDICATIONS:  ?Current Outpatient Medications  ?Medication Sig Dispense Refill  ? acetaminophen (TYLENOL) 500 MG tablet Take 1,000 mg by mouth every 6 (six) hours as needed.    ? albuterol (VENTOLIN HFA) 108 (90 Base) MCG/ACT inhaler INHALE 2 PUFFS INTO THE LUNGS EVERY 6 HOURS AS NEEDED FOR WHEEZING OR SHORTNESS OF BREATH (Patient taking differently: Inhale 2 puffs into the lungs 2 (two) times daily.) 6.7 g 2  ? amphetamine-dextroamphetamine (ADDERALL XR) 30 MG 24 hr capsule Take 30 mg by mouth every morning.    ? azithromycin (ZITHROMAX) 500 MG tablet Take 1 tablet (500 mg total) by mouth daily for 10 days. 10 tablet 0  ? capecitabine (XELODA) 500 MG tablet TAKE 4 TABLETS BY MOUTH 2 TIMES DAILY AFTER A MEAL FOR 14 DAYS, THEN HOLD FOR 7 DAYS, REPEAT EVERY 21 DAYS. 112 tablet 0  ? Cholecalciferol (VITAMIN D3) 50 MCG (2000 UT) TABS Take 1 tablet by mouth daily at 12 noon.    ? clonazePAM (KLONOPIN) 0.5 MG tablet Take 0.5-1 tablets (0.25-0.5 mg total) by mouth 2 (two) times daily as needed for anxiety. 30 tablet 0  ? dexamethasone (DECADRON) 4 MG tablet Take 1 tablet (4 mg total) by mouth daily for 7 days. 7 tablet 0  ?  docusate sodium (COLACE) 100 MG capsule Take 1 capsule (100 mg total) by mouth 2 (two) times daily as needed for mild constipation. 20 capsule 0  ? furosemide (LASIX) 20 MG tablet Take 1 tablet (20 mg total) by mouth daily. 30 tablet 1  ? HYDROmorphone (DILAUDID) 4 MG tablet Take 1-2 tablets (4-8 mg total) by mouth every 4 (four) hours as needed for severe pain (breakthrough pain). 60 tablet 0  ? ibuprofen (ADVIL) 200 MG tablet Take 400 mg by mouth every 6 (six) hours as needed for mild pain.    ? ipratropium-albuterol (DUONEB) 0.5-2.5 (3) MG/3ML SOLN  Take 3 mLs by nebulization every 4 (four) hours as needed. 360 mL 0  ? metoprolol tartrate (LOPRESSOR) 25 MG tablet Take 1 tablet (25 mg total) by mouth 2 (two) times daily. 60 tablet 1  ? morphine (MS CONTIN) 60 MG 12 hr

## 2021-03-20 NOTE — Progress Notes (Signed)
HR 120, O2 91% RA ,and a documented note from previous visit states "Zometa was discontinued secondary to intolerance, therefore proceed with Xgeva on even numbered cycles." Dr. Grayland Ormond and Nurse made aware. Per Gust Rung, RN, per Dr. Grayland Ormond, okay to proceed with Zometa today.  ?

## 2021-03-20 NOTE — Progress Notes (Signed)
Radiation Oncology ?Follow up Note ? ?Name: Kaylee Romero   ?Date:   03/20/2021 ?MRN:  [id number] ?DOB: [birth year]  ? ? ?This 41 y.o. female presents to the clinic today for reevaluation of persistent pain in her left chest secondary to malignant involvement of her left lateral ribs and patient with known stage IV invasive mammary carcinoma previously treated with SBRT to her thoracic spine. ? ?REFERRING PROVIDER: Dion Body, MD ? ?HPI: Patient is a 41 year old female who has had excellent palliative benefit to her thoracic spine from SBRT to that lesion which was completed approximately 2 weeks prior.  She has been having some persistent pain in her left chest..  She has known metastatic disease involving the left chest wall with prior involvement on PET CT scan to her left hemithorax with significant pleural thickening and lytic lesions of the left lateral ribs consistent with metastatic disease.  That PET CT scan was done in November most recent CT scan of the chest shows widespread bilateral pulmonary and pleural metastatic disease slightly worsening..  She has been seen by the pain clinic and they have recommended a left-sided thoracic epidural steroid injection under fluoroscopic guidance to the T5-T9 level.  This will probably be be performed next week. ? ?COMPLICATIONS OF TREATMENT: none ? ?FOLLOW UP COMPLIANCE: keeps appointments  ? ?PHYSICAL EXAM:  ?BP (!) 147/84 (BP Location: Left Arm, Patient Position: Sitting)   Pulse (!) 119   Temp (!) 97.3 ?F (36.3 ?C) (Tympanic)   Resp 16   Ht '5\' 4"'$  (1.626 m)   Wt 159 lb 6.4 oz (72.3 kg)   LMP 01/06/2019 (Approximate)   SpO2 96% Comment: on 2L  BMI 27.36 kg/m?  ?Frail-appearing female in moderate pain discomfort.  Her oxygen saturation was low today we put her on nasal oxygen.  Well-developed well-nourished patient in NAD. HEENT reveals PERLA, EOMI, discs not visualized.  Oral cavity is clear. No oral mucosal lesions are identified. Neck is clear  without evidence of cervical or supraclavicular adenopathy. Lungs are clear to A&P. Cardiac examination is essentially unremarkable with regular rate and rhythm without murmur rub or thrill. Abdomen is benign with no organomegaly or masses noted. Motor sensory and DTR levels are equal and symmetric in the upper and lower extremities. Cranial nerves II through XII are grossly intact. Proprioception is intact. No peripheral adenopathy or edema is identified. No motor or sensory levels are noted. Crude visual fields are within normal range. ? ?RADIOLOGY RESULTS: PET/CT and CT scans reviewed compatible with above-stated findings ? ?PLAN: At this time elect to go with palliative radiation therapy to her left lateral ribs.  I would use PET/CT criteria for guidance of my fields.  I would plan on delivering 30 Gray in 10 fractions.  Would use a hybrid technique to avoid previously rated areas of the spine as well as normal lung volume.  Risks and benefits of treatment including skin reaction fatigue alteration of blood counts and possible involvement of normal lung parenchyma all were discussed in detail with the patient.  I have put her on the schedule for first thing next week for simulation and will have her treated by the end of next week.  They both comprehend my treatment plan well. ? ?I would like to take this opportunity to thank you for allowing me to participate in the care of your patient.. ?  ? Noreene Filbert, MD ? ?

## 2021-03-20 NOTE — Progress Notes (Signed)
Pt wants to f/u from hospital . Left pleural space/rib pain 3/10 pain at this time. Pt states she took pain medication prior to arrival. Pt also states that rad onc put her on 2L [place] while in their care. O2 sats currently 90-92% on RA HR 116 ?

## 2021-03-20 NOTE — Addendum Note (Signed)
Addended by: Jennet Maduro B on: 03/20/2021 04:08 PM ? ? Modules accepted: Orders ? ?

## 2021-03-21 ENCOUNTER — Encounter: Payer: Self-pay | Admitting: Emergency Medicine

## 2021-03-21 ENCOUNTER — Telehealth: Payer: Self-pay

## 2021-03-21 ENCOUNTER — Ambulatory Visit
Admission: RE | Admit: 2021-03-21 | Discharge: 2021-03-21 | Disposition: A | Discharge status: Home / Self Care | Payer: BC Managed Care – PPO | Attending: Hospice and Palliative Medicine | Admitting: Hospice and Palliative Medicine

## 2021-03-21 ENCOUNTER — Other Ambulatory Visit: Payer: Self-pay | Admitting: *Deleted

## 2021-03-21 ENCOUNTER — Other Ambulatory Visit: Payer: Self-pay

## 2021-03-21 ENCOUNTER — Encounter: Payer: Self-pay | Admitting: Internal Medicine

## 2021-03-21 ENCOUNTER — Telehealth: Payer: Self-pay | Admitting: Oncology

## 2021-03-21 ENCOUNTER — Encounter: Payer: Self-pay | Admitting: Oncology

## 2021-03-21 ENCOUNTER — Ambulatory Visit
Admission: RE | Admit: 2021-03-21 | Discharge: 2021-03-21 | Disposition: A | Discharge status: Home / Self Care | Payer: BC Managed Care – PPO | Source: Ambulatory Visit | Attending: Hospice and Palliative Medicine | Admitting: Hospice and Palliative Medicine

## 2021-03-21 DIAGNOSIS — C50411 Malignant neoplasm of upper-outer quadrant of right female breast: Secondary | ICD-10-CM

## 2021-03-21 DIAGNOSIS — C7989 Secondary malignant neoplasm of other specified sites: Secondary | ICD-10-CM

## 2021-03-21 DIAGNOSIS — Z170 Estrogen receptor positive status [ER+]: Secondary | ICD-10-CM | POA: Insufficient documentation

## 2021-03-21 LAB — CANCER ANTIGEN 27.29: CA 27.29: 141.3 U/mL — ABNORMAL HIGH (ref 0.0–38.6)

## 2021-03-21 IMAGING — CR DG CHEST 2V
2 series · 2 of 2 positions shown · non-contrast
Comparison: [DATE], [DATE]

CLINICAL DATA: 40-year-old female with shortness of breath and
chest pain

EXAM:
CHEST - 2 VIEW

[chest pa]
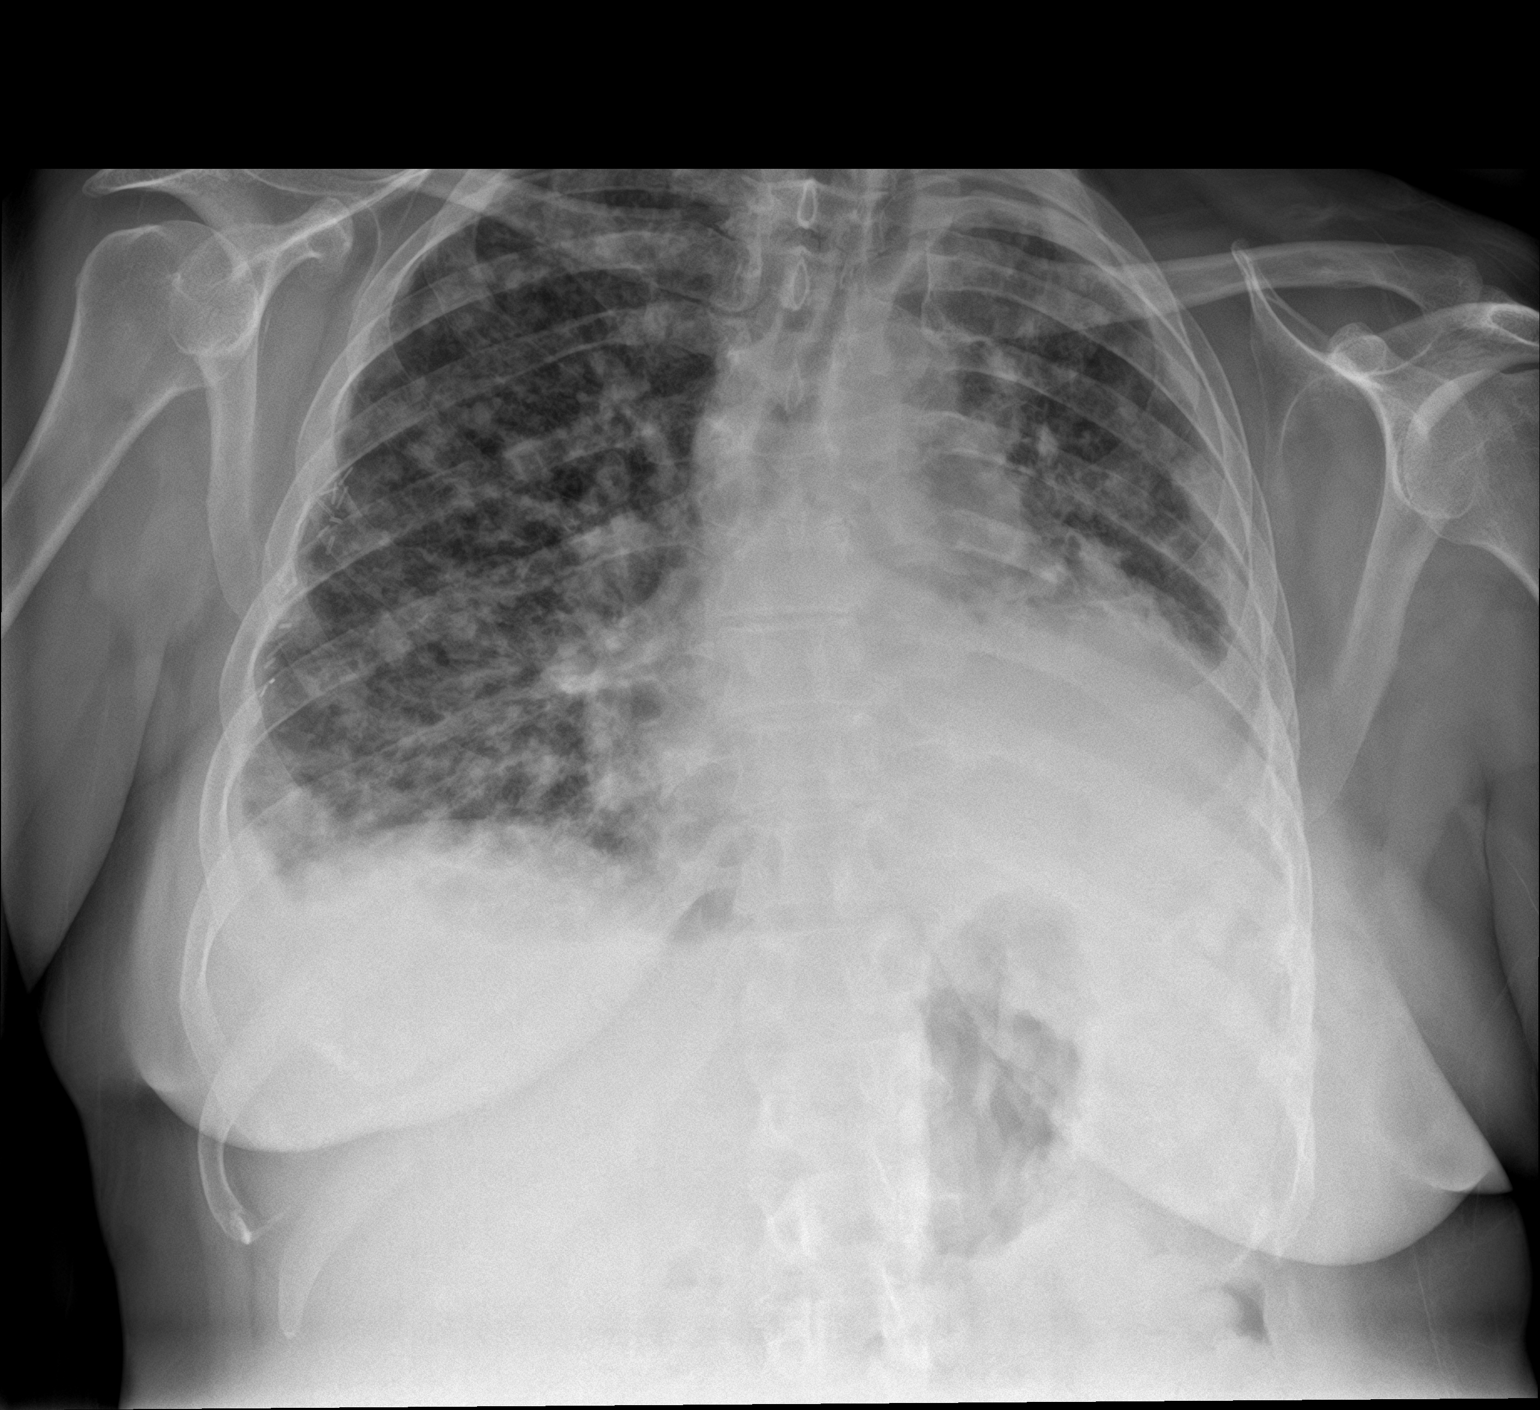

[chest lat]
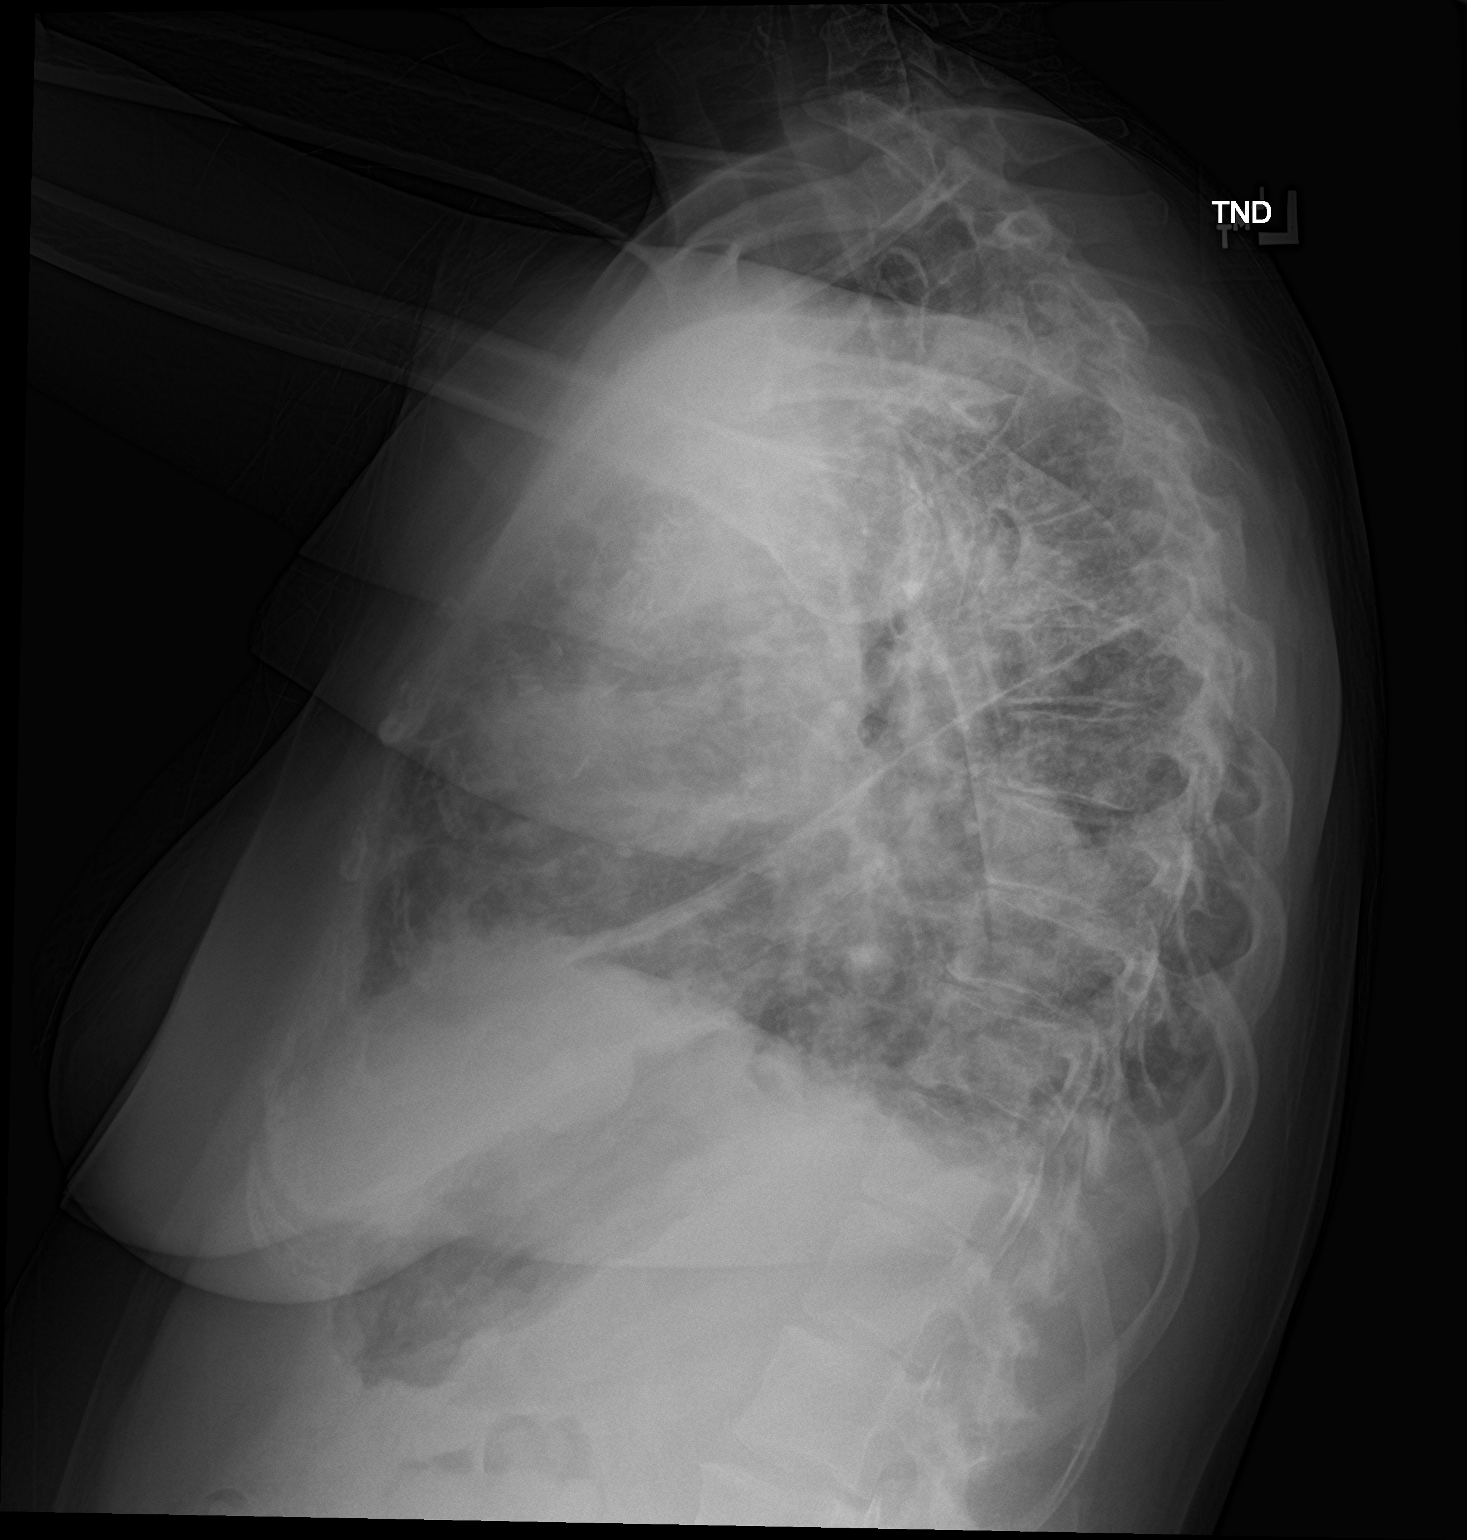

[2 of 2 positions shown; findings below may reference images not displayed]

FINDINGS: Cardiomediastinal silhouette enlarged, unchanged. Obscuration of the
left hemidiaphragm with opacity at the left lung base. Opacity in
the costophrenic sulcus on the lateral view. Thickening of the
fissure, new from the comparison.

Meniscus at the right lung base, new from the comparison.

Innumerable nodular densities, increased in size/conspicuity from
recent comparison studies.

No pneumothorax.
IMPRESSION: New bilateral pleural effusions and left-sided
consolidation/atelectasis.

Interval progression of pulmonary metastases.

## 2021-03-21 NOTE — Telephone Encounter (Signed)
Pt called report that she is not feeling well. Experiencing soreness, tightness in her chest, spine pain and unable to breathe. Pt is very concerned about the breathing. She is requesting a [facility] visit. Could someone contact the patient to discuss. ?

## 2021-03-21 NOTE — Progress Notes (Signed)
I spoke to patient brother, Kaylee Romero- concerned about low-grade fever of 100. Patient missed a few antibiotics-on zpak. Recommend taking antibiotics schedule. However, if she has recurrent fevers- call/ further evaluation of the emergency room. ? ?Patient appointment in the cancer center on 2 ?3/20

## 2021-03-21 NOTE — Telephone Encounter (Signed)
Called patient regarding her symptoms informed her Josh recommended chest xray, patient informed [facility]. Informed her we will call with results. ?

## 2021-03-21 NOTE — Progress Notes (Signed)
Patient on schedule for Port/Liver biopsy 3/22, called and spoke with Cory/husband on phone with pre procedure instructions given. Made aware to be here @ 0730, NPO after Mn prior to procedure/ as well as driver post procedure/recovery/discharge. May take bp/pain meds with sip of water prior to procedure/ stated understanding. ?

## 2021-03-21 NOTE — Telephone Encounter (Signed)
Informed patient biopsy is scheduled to arrive at 730 on 03/26/21 NPO, patient agrees and verbalizes understanding  ?

## 2021-03-21 NOTE — Telephone Encounter (Signed)
Ok will do.

## 2021-03-24 ENCOUNTER — Ambulatory Visit: Payer: BC Managed Care – PPO

## 2021-03-24 ENCOUNTER — Ambulatory Visit
Admission: RE | Admit: 2021-03-24 | Discharge: 2021-03-24 | Disposition: A | Discharge status: Home / Self Care | Payer: BC Managed Care – PPO | Source: Ambulatory Visit | Attending: Hospice and Palliative Medicine | Admitting: Hospice and Palliative Medicine

## 2021-03-24 ENCOUNTER — Other Ambulatory Visit: Payer: Self-pay | Admitting: Radiology

## 2021-03-24 ENCOUNTER — Ambulatory Visit
Admission: RE | Admit: 2021-03-24 | Discharge: 2021-03-24 | Disposition: A | Discharge status: Home / Self Care | Payer: BC Managed Care – PPO | Source: Ambulatory Visit | Attending: Radiation Oncology | Admitting: Radiation Oncology

## 2021-03-24 ENCOUNTER — Other Ambulatory Visit: Payer: Self-pay | Admitting: Emergency Medicine

## 2021-03-24 ENCOUNTER — Other Ambulatory Visit: Payer: Self-pay

## 2021-03-24 ENCOUNTER — Telehealth: Payer: Self-pay | Admitting: *Deleted

## 2021-03-24 ENCOUNTER — Other Ambulatory Visit: Payer: Self-pay | Admitting: Hospice and Palliative Medicine

## 2021-03-24 ENCOUNTER — Telehealth: Payer: Self-pay | Admitting: Emergency Medicine

## 2021-03-24 DIAGNOSIS — Z170 Estrogen receptor positive status [ER+]: Secondary | ICD-10-CM | POA: Insufficient documentation

## 2021-03-24 DIAGNOSIS — Z510 Encounter for antineoplastic radiation therapy: Secondary | ICD-10-CM | POA: Insufficient documentation

## 2021-03-24 DIAGNOSIS — C7951 Secondary malignant neoplasm of bone: Secondary | ICD-10-CM | POA: Diagnosis present

## 2021-03-24 DIAGNOSIS — C7931 Secondary malignant neoplasm of brain: Secondary | ICD-10-CM | POA: Diagnosis not present

## 2021-03-24 DIAGNOSIS — C50411 Malignant neoplasm of upper-outer quadrant of right female breast: Secondary | ICD-10-CM | POA: Insufficient documentation

## 2021-03-24 DIAGNOSIS — R0602 Shortness of breath: Secondary | ICD-10-CM | POA: Diagnosis not present

## 2021-03-24 DIAGNOSIS — Z515 Encounter for palliative care: Secondary | ICD-10-CM

## 2021-03-24 IMAGING — US US CHEST/MEDIASTINUM
1 series · 6 of 6 positions shown · non-contrast
Comparison: Chest x-ray on [DATE] and CT of the chest on
[DATE]

CLINICAL DATA: History of metastatic breast carcinoma with
shortness of breath and chest x-ray demonstrating potential pleural
effusions.

EXAM:
CHEST ULTRASOUND

[Series 1: us chest/mediastinum · 0.25mm/px · 6 of 6 slices shown]
[im 1/6]
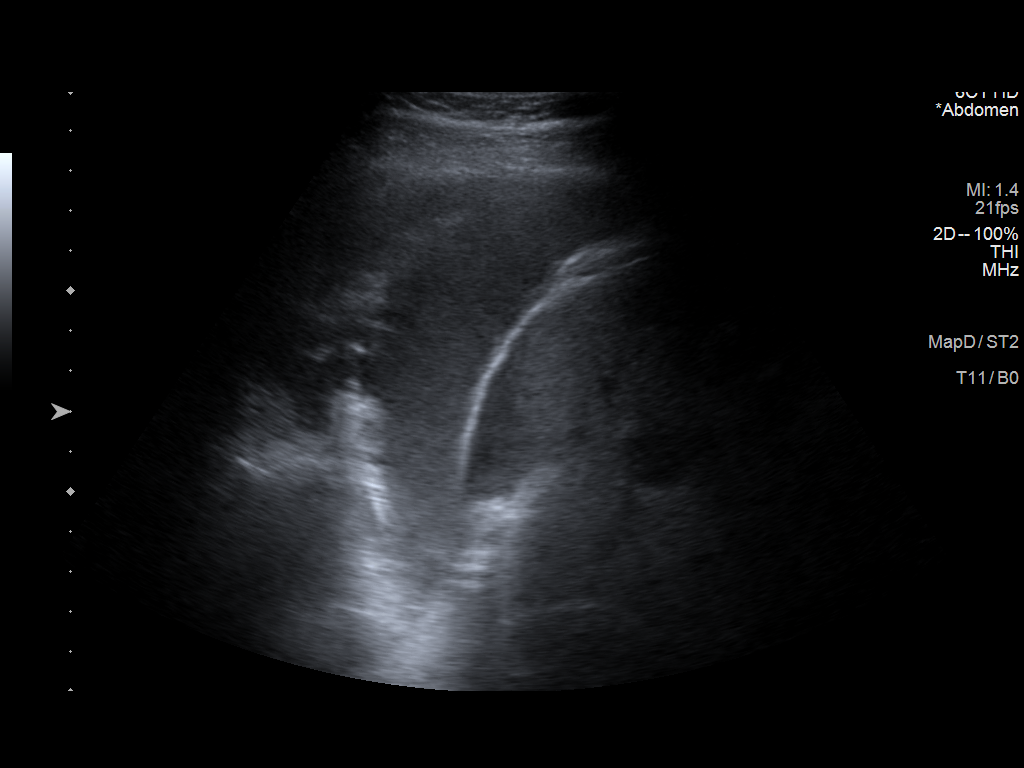
[im 2/6]
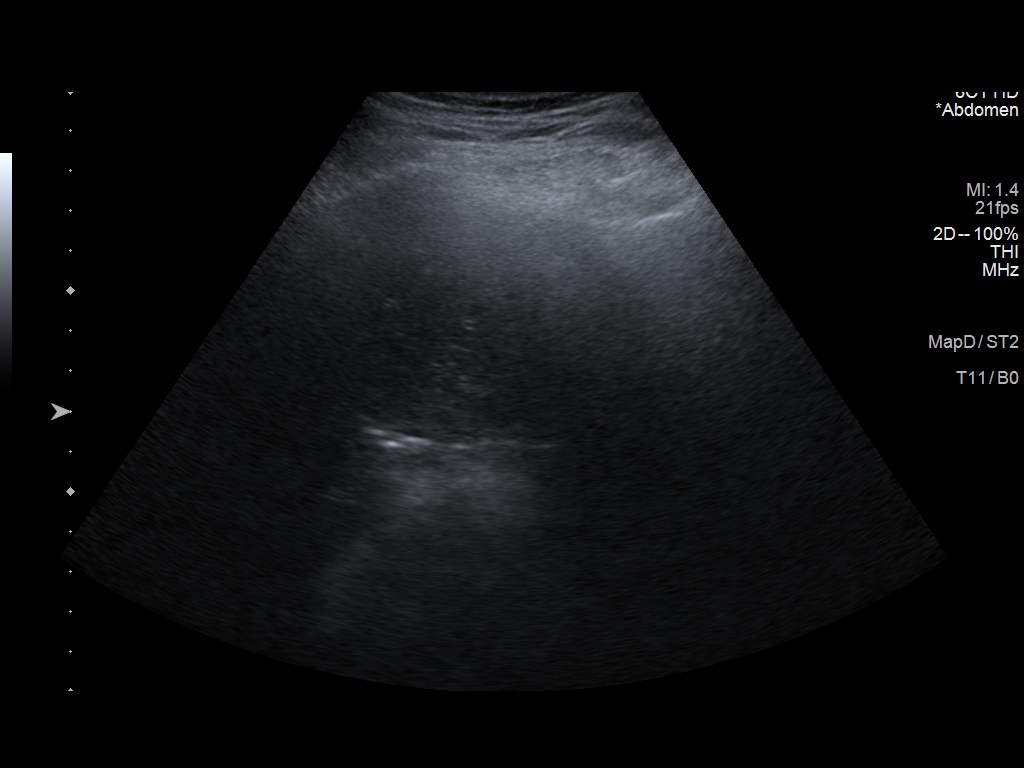
[im 3/6]
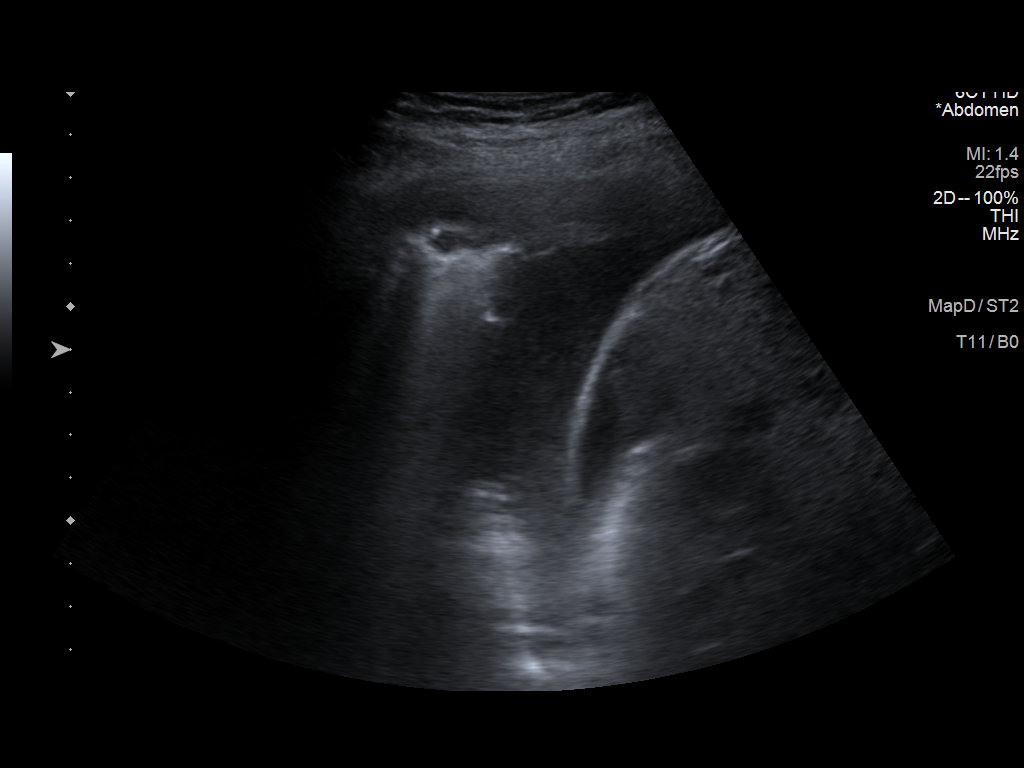
[im 4/6]
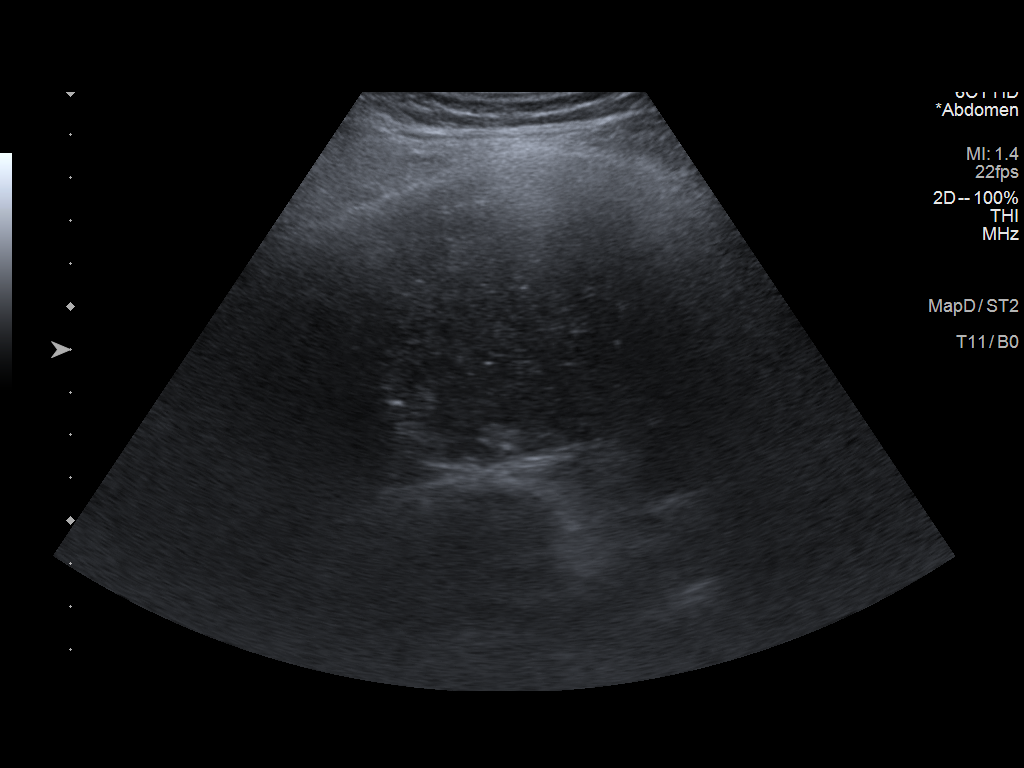
[im 5/6]
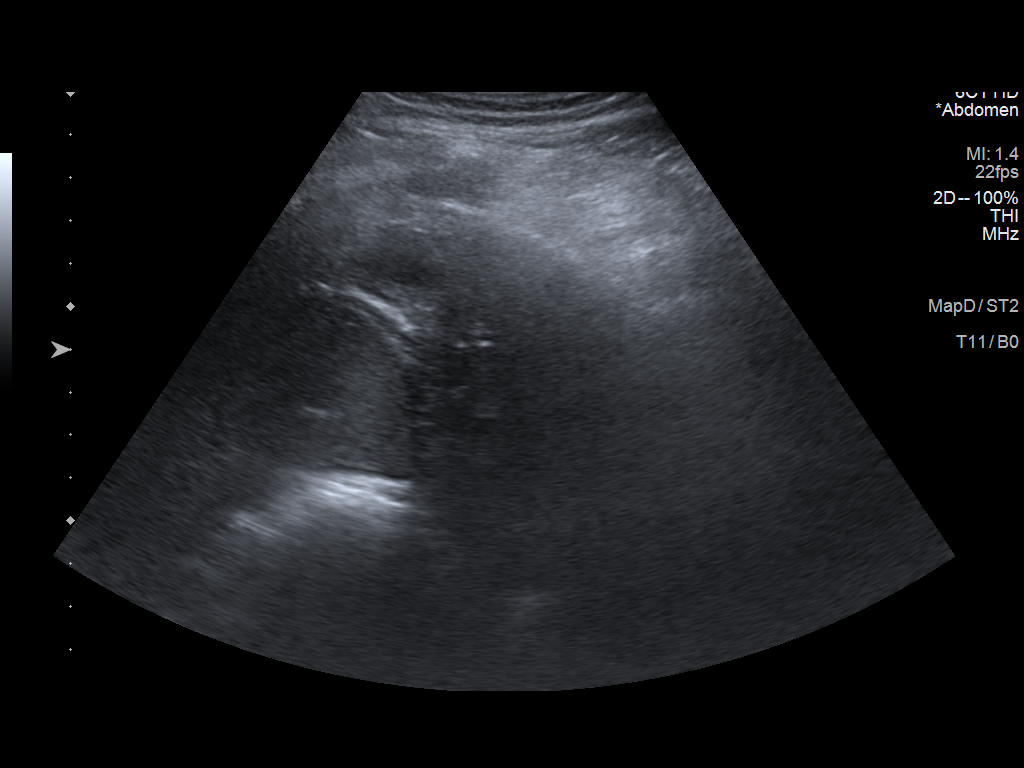
[im 6/6]
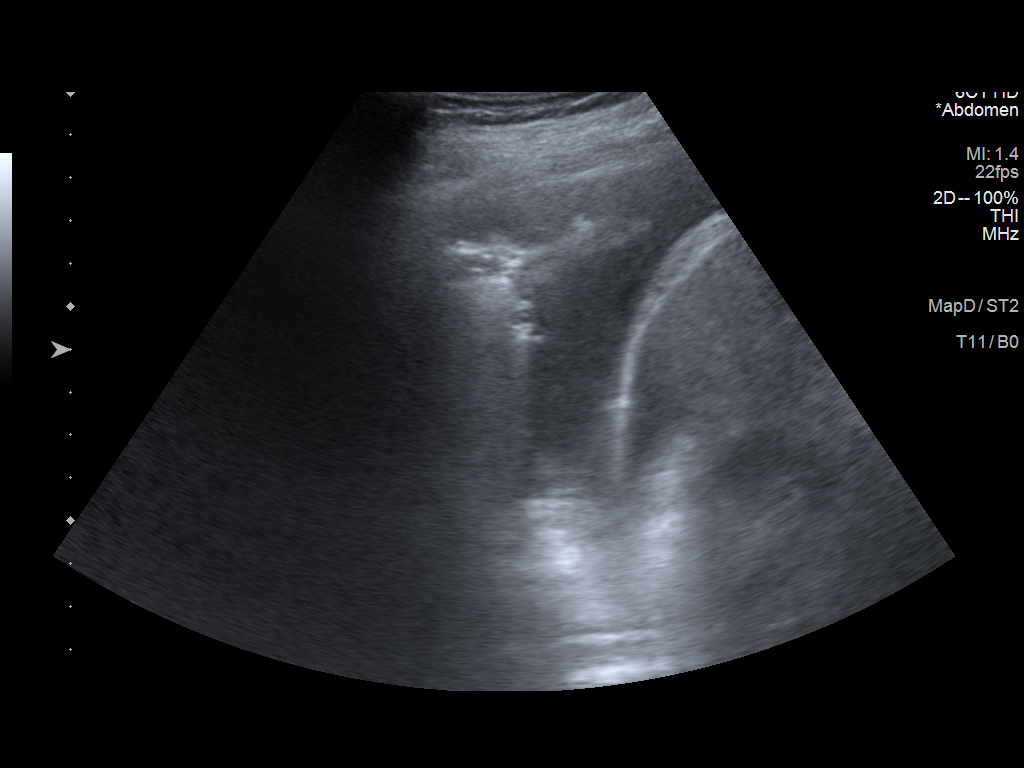

[6 of 6 positions shown; findings below may reference images not displayed]

FINDINGS: Examination of the chest from a posterior upright position
demonstrates a small right pleural effusion. There was not enough
fluid present to warrant thoracentesis. On the left side, posterior
pleural abnormality appears solid and is suspicious for tumor with
no significant component of anechoic fluid visualized.
IMPRESSION: 1. Small right pleural effusion.
2. Posterior pleural abnormality in the left chest appears solid and
is suspicious for tumor.
3. Thoracentesis was not performed today due to sonographic
findings.

## 2021-03-24 MED ORDER — HYDROMORPHONE HCL 4 MG PO TABS: 4.0000 mg | ORAL_TABLET | ORAL | 0 refills | Status: AC | PRN

## 2021-03-24 MED ORDER — NALOXONE HCL 4 MG/0.1ML NA LIQD: NASAL | 0 refills | Status: AC

## 2021-03-24 NOTE — Telephone Encounter (Signed)
Pt confirmed availability for thoracentesis today at 1215 for 1230 appointment. Specialty schedulers aware of confirmation ?

## 2021-03-24 NOTE — Telephone Encounter (Signed)
I spoke with patient's husband by phone this morning.  He says that patient has had some drowsiness when she takes two hydromorphone tablets.  However, family has dose reduced to 1 tablet and patient is tolerating well with stable pain.  We discussed pain regimen in detail and husband verbalized agreement with plan.  We also discussed results of her chest x-ray.  Will order thoracentesis for symptom management. ? ?Naloxone kit ordered for emergency use ?

## 2021-03-24 NOTE — Telephone Encounter (Signed)
Josh spoke with patient husband regarding pain medications ?

## 2021-03-24 NOTE — Telephone Encounter (Signed)
Message from Med records: ?"Husband called and said that the Dilaudid that [place] prescribed is making the patient Sleepy. He said she can't hold her eyes open but for a few minutes. Also said the Morphine was working better. He said, "She is out of it." Please call husband [phone]. "  Please advise ?

## 2021-03-25 ENCOUNTER — Encounter: Payer: Self-pay | Admitting: Hospice and Palliative Medicine

## 2021-03-25 ENCOUNTER — Other Ambulatory Visit: Payer: Self-pay

## 2021-03-25 ENCOUNTER — Inpatient Hospital Stay
Admission: EM | Admit: 2021-03-25 | Discharge: 2021-04-05 | DRG: 054 | Disposition: E | Payer: BC Managed Care – PPO | Attending: Internal Medicine | Admitting: Internal Medicine

## 2021-03-25 ENCOUNTER — Emergency Department: Payer: BC Managed Care – PPO

## 2021-03-25 ENCOUNTER — Other Ambulatory Visit: Payer: Self-pay | Admitting: Radiology

## 2021-03-25 ENCOUNTER — Encounter: Payer: Self-pay | Admitting: Licensed Clinical Social Worker

## 2021-03-25 DIAGNOSIS — C782 Secondary malignant neoplasm of pleura: Secondary | ICD-10-CM | POA: Diagnosis present

## 2021-03-25 DIAGNOSIS — C7931 Secondary malignant neoplasm of brain: Principal | ICD-10-CM | POA: Diagnosis present

## 2021-03-25 DIAGNOSIS — Z20822 Contact with and (suspected) exposure to covid-19: Secondary | ICD-10-CM | POA: Diagnosis present

## 2021-03-25 DIAGNOSIS — F411 Generalized anxiety disorder: Secondary | ICD-10-CM | POA: Diagnosis not present

## 2021-03-25 DIAGNOSIS — C7960 Secondary malignant neoplasm of unspecified ovary: Secondary | ICD-10-CM | POA: Diagnosis present

## 2021-03-25 DIAGNOSIS — C7800 Secondary malignant neoplasm of unspecified lung: Secondary | ICD-10-CM | POA: Diagnosis present

## 2021-03-25 DIAGNOSIS — R4182 Altered mental status, unspecified: Secondary | ICD-10-CM | POA: Diagnosis present

## 2021-03-25 DIAGNOSIS — G893 Neoplasm related pain (acute) (chronic): Secondary | ICD-10-CM | POA: Diagnosis present

## 2021-03-25 DIAGNOSIS — Z515 Encounter for palliative care: Secondary | ICD-10-CM | POA: Diagnosis not present

## 2021-03-25 DIAGNOSIS — J9601 Acute respiratory failure with hypoxia: Secondary | ICD-10-CM | POA: Diagnosis not present

## 2021-03-25 DIAGNOSIS — I10 Essential (primary) hypertension: Secondary | ICD-10-CM | POA: Diagnosis not present

## 2021-03-25 DIAGNOSIS — C7951 Secondary malignant neoplasm of bone: Secondary | ICD-10-CM | POA: Diagnosis present

## 2021-03-25 DIAGNOSIS — I252 Old myocardial infarction: Secondary | ICD-10-CM | POA: Diagnosis not present

## 2021-03-25 DIAGNOSIS — Z170 Estrogen receptor positive status [ER+]: Secondary | ICD-10-CM | POA: Diagnosis not present

## 2021-03-25 DIAGNOSIS — M5134 Other intervertebral disc degeneration, thoracic region: Secondary | ICD-10-CM | POA: Diagnosis present

## 2021-03-25 DIAGNOSIS — G894 Chronic pain syndrome: Secondary | ICD-10-CM | POA: Diagnosis present

## 2021-03-25 DIAGNOSIS — Z853 Personal history of malignant neoplasm of breast: Secondary | ICD-10-CM

## 2021-03-25 DIAGNOSIS — G928 Other toxic encephalopathy: Secondary | ICD-10-CM | POA: Diagnosis present

## 2021-03-25 DIAGNOSIS — C787 Secondary malignant neoplasm of liver and intrahepatic bile duct: Secondary | ICD-10-CM | POA: Diagnosis present

## 2021-03-25 DIAGNOSIS — Z811 Family history of alcohol abuse and dependence: Secondary | ICD-10-CM

## 2021-03-25 DIAGNOSIS — Z66 Do not resuscitate: Secondary | ICD-10-CM | POA: Diagnosis not present

## 2021-03-25 DIAGNOSIS — C7970 Secondary malignant neoplasm of unspecified adrenal gland: Secondary | ICD-10-CM | POA: Diagnosis present

## 2021-03-25 DIAGNOSIS — I119 Hypertensive heart disease without heart failure: Secondary | ICD-10-CM | POA: Diagnosis present

## 2021-03-25 DIAGNOSIS — Z87891 Personal history of nicotine dependence: Secondary | ICD-10-CM

## 2021-03-25 DIAGNOSIS — J189 Pneumonia, unspecified organism: Secondary | ICD-10-CM

## 2021-03-25 DIAGNOSIS — F909 Attention-deficit hyperactivity disorder, unspecified type: Secondary | ICD-10-CM | POA: Diagnosis present

## 2021-03-25 DIAGNOSIS — Z888 Allergy status to other drugs, medicaments and biological substances status: Secondary | ICD-10-CM

## 2021-03-25 DIAGNOSIS — E876 Hypokalemia: Secondary | ICD-10-CM | POA: Diagnosis present

## 2021-03-25 DIAGNOSIS — Z510 Encounter for antineoplastic radiation therapy: Secondary | ICD-10-CM | POA: Diagnosis not present

## 2021-03-25 DIAGNOSIS — J9602 Acute respiratory failure with hypercapnia: Secondary | ICD-10-CM | POA: Diagnosis not present

## 2021-03-25 DIAGNOSIS — R400 Somnolence: Secondary | ICD-10-CM

## 2021-03-25 DIAGNOSIS — Z803 Family history of malignant neoplasm of breast: Secondary | ICD-10-CM | POA: Diagnosis not present

## 2021-03-25 DIAGNOSIS — Z8249 Family history of ischemic heart disease and other diseases of the circulatory system: Secondary | ICD-10-CM

## 2021-03-25 DIAGNOSIS — C50919 Malignant neoplasm of unspecified site of unspecified female breast: Secondary | ICD-10-CM | POA: Diagnosis not present

## 2021-03-25 DIAGNOSIS — A419 Sepsis, unspecified organism: Secondary | ICD-10-CM | POA: Diagnosis present

## 2021-03-25 DIAGNOSIS — R0602 Shortness of breath: Secondary | ICD-10-CM | POA: Diagnosis present

## 2021-03-25 LAB — CBC WITH DIFFERENTIAL/PLATELET
Abs Immature Granulocytes: 0.25 10*3/uL — ABNORMAL HIGH (ref 0.00–0.07)
Basophils Absolute: 0.1 10*3/uL (ref 0.0–0.1)
Basophils Relative: 0 %
Eosinophils Absolute: 0.2 10*3/uL (ref 0.0–0.5)
Eosinophils Relative: 1 %
HCT: 36.3 % (ref 36.0–46.0)
Hemoglobin: 12.1 g/dL (ref 12.0–15.0)
Immature Granulocytes: 1 %
Lymphocytes Relative: 6 %
Lymphs Abs: 1.3 10*3/uL (ref 0.7–4.0)
MCH: 32.2 pg (ref 26.0–34.0)
MCHC: 33.3 g/dL (ref 30.0–36.0)
MCV: 96.5 fL (ref 80.0–100.0)
Monocytes Absolute: 1 10*3/uL (ref 0.1–1.0)
Monocytes Relative: 5 %
Neutro Abs: 18.7 10*3/uL — ABNORMAL HIGH (ref 1.7–7.7)
Neutrophils Relative %: 87 %
Platelets: 275 10*3/uL (ref 150–400)
RBC: 3.76 MIL/uL — ABNORMAL LOW (ref 3.87–5.11)
RDW: 15.1 % (ref 11.5–15.5)
WBC: 21.5 10*3/uL — ABNORMAL HIGH (ref 4.0–10.5)
nRBC: 0 % (ref 0.0–0.2)

## 2021-03-25 LAB — URINALYSIS, COMPLETE (UACMP) WITH MICROSCOPIC
Bilirubin Urine: NEGATIVE
Glucose, UA: NEGATIVE mg/dL
Hgb urine dipstick: NEGATIVE
Ketones, ur: NEGATIVE mg/dL
Leukocytes,Ua: NEGATIVE
Nitrite: NEGATIVE
Protein, ur: 30 mg/dL — AB
Specific Gravity, Urine: 1.029 (ref 1.005–1.030)
pH: 6 (ref 5.0–8.0)

## 2021-03-25 LAB — MRSA NEXT GEN BY PCR, NASAL: MRSA by PCR Next Gen: NOT DETECTED

## 2021-03-25 LAB — COMPREHENSIVE METABOLIC PANEL
ALT: 70 U/L — ABNORMAL HIGH (ref 0–44)
ALT: 64 U/L — ABNORMAL HIGH (ref 0–44)
AST: 234 U/L — ABNORMAL HIGH (ref 15–41)
AST: 203 U/L — ABNORMAL HIGH (ref 15–41)
Albumin: 3.2 g/dL — ABNORMAL LOW (ref 3.5–5.0)
Albumin: 2.8 g/dL — ABNORMAL LOW (ref 3.5–5.0)
Alkaline Phosphatase: 132 U/L — ABNORMAL HIGH (ref 38–126)
Alkaline Phosphatase: 114 U/L (ref 38–126)
Anion gap: 10 (ref 5–15)
Anion gap: 8 (ref 5–15)
BUN: 25 mg/dL — ABNORMAL HIGH (ref 6–20)
BUN: 23 mg/dL — ABNORMAL HIGH (ref 6–20)
CO2: 34 mmol/L — ABNORMAL HIGH (ref 22–32)
CO2: 32 mmol/L (ref 22–32)
Calcium: 14.5 mg/dL (ref 8.9–10.3)
Calcium: 12.7 mg/dL — ABNORMAL HIGH (ref 8.9–10.3)
Chloride: 93 mmol/L — ABNORMAL LOW (ref 98–111)
Chloride: 100 mmol/L (ref 98–111)
Creatinine, Ser: 0.93 mg/dL (ref 0.44–1.00)
Creatinine, Ser: 0.75 mg/dL (ref 0.44–1.00)
GFR, Estimated: >60 mL/min (ref >60)
GFR, Estimated: >60 mL/min (ref >60)
Glucose, Bld: 92 mg/dL (ref 70–99)
Glucose, Bld: 107 mg/dL — ABNORMAL HIGH (ref 70–99)
Potassium: 3.3 mmol/L — ABNORMAL LOW (ref 3.5–5.1)
Potassium: 3.1 mmol/L — ABNORMAL LOW (ref 3.5–5.1)
Sodium: 137 mmol/L (ref 135–145)
Sodium: 140 mmol/L (ref 135–145)
Total Bilirubin: 0.9 mg/dL (ref 0.3–1.2)
Total Bilirubin: 1 mg/dL (ref 0.3–1.2)
Total Protein: 6.8 g/dL (ref 6.5–8.1)
Total Protein: 6.3 g/dL — ABNORMAL LOW (ref 6.5–8.1)

## 2021-03-25 LAB — GLUCOSE, CAPILLARY: Glucose-Capillary: 109 mg/dL — ABNORMAL HIGH (ref 70–99)

## 2021-03-25 LAB — MAGNESIUM: Magnesium: 1.7 mg/dL (ref 1.7–2.4)

## 2021-03-25 LAB — CALCIUM: Calcium: 12.6 mg/dL — ABNORMAL HIGH (ref 8.9–10.3)

## 2021-03-25 LAB — TSH: TSH: 0.773 u[IU]/mL (ref 0.350–4.500)

## 2021-03-25 LAB — BRAIN NATRIURETIC PEPTIDE: B Natriuretic Peptide: 60 pg/mL (ref 0.0–100.0)

## 2021-03-25 LAB — TROPONIN I (HIGH SENSITIVITY)
Troponin I (High Sensitivity): 30 ng/L — ABNORMAL HIGH (ref <18)
Troponin I (High Sensitivity): 27 ng/L — ABNORMAL HIGH (ref <18)

## 2021-03-25 LAB — LACTIC ACID, PLASMA
Lactic Acid, Venous: 3.2 mmol/L (ref 0.5–1.9)
Lactic Acid, Venous: 1.8 mmol/L (ref 0.5–1.9)

## 2021-03-25 LAB — LIPASE, BLOOD: Lipase: 20 U/L (ref 11–51)

## 2021-03-25 IMAGING — DX DG CHEST 1V PORT
1 series · 1 of 1 positions shown · non-contrast
Comparison: Radiograph [DATE], CT [DATE]

CLINICAL DATA: Chest pain

EXAM:
PORTABLE CHEST 1 VIEW

[chest ap]
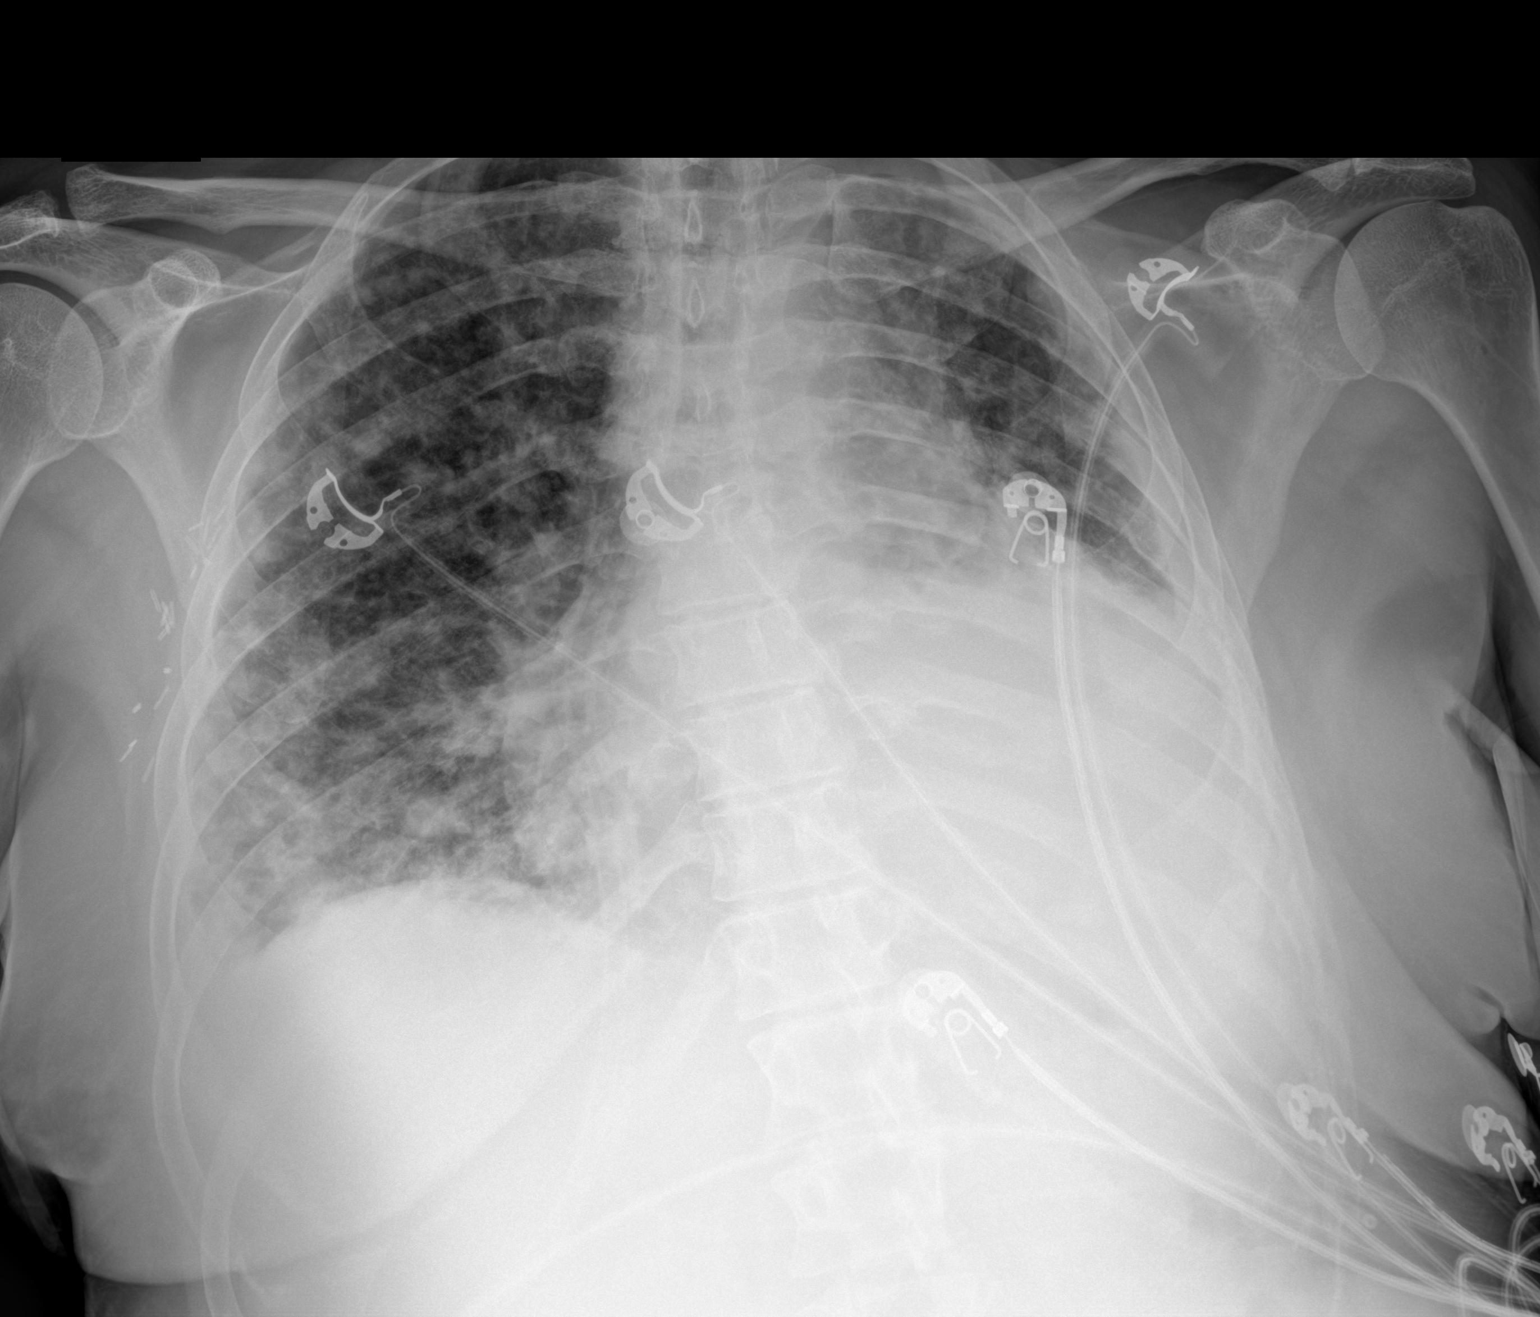

[1 of 1 positions shown; findings below may reference images not displayed]

FINDINGS: Unchanged, enlarged cardiomediastinal silhouette. Unchanged diffuse
nodular opacities consistent with known metastases. Unchanged dense
consolidation in the left mid to lower lung. There is increased
opacities in the peripheral and basilar right lower lung. Unchanged
bilateral pleural effusions, left greater than right. No
pneumothorax. No acute osseous abnormality. Right axillary surgical
clips.
IMPRESSION: Diffuse pulmonary metastatic disease as seen on prior exam.
Increased airspace opacities in the peripheral and basilar right
lower lung, possibly developing infection. Unchanged dense
consolidation in the mid to left lower lung. Unchanged bilateral
pleural effusions, left greater than right.

## 2021-03-25 IMAGING — CT CT ANGIO CHEST
2 of 7 series · 18 of 46 positions shown · IV contrast (APPLIED)
Comparison: Chest x-ray from same day. CT chest dated [DATE].

CLINICAL DATA: Shortness of breath. History of metastatic breast
cancer.

EXAM:
CT ANGIOGRAPHY CHEST WITH CONTRAST
TECHNIQUE: Multidetector CT imaging of the chest was performed using the
standard protocol during bolus administration of intravenous
contrast. Multiplanar CT image reconstructions and MIPs were
obtained to evaluate the vascular anatomy.

[Series 9: thins · axial · 0.65mm/px · z∈[-289,-22]mm · 15 of 371 slices shown]
[im 19/371  lung]
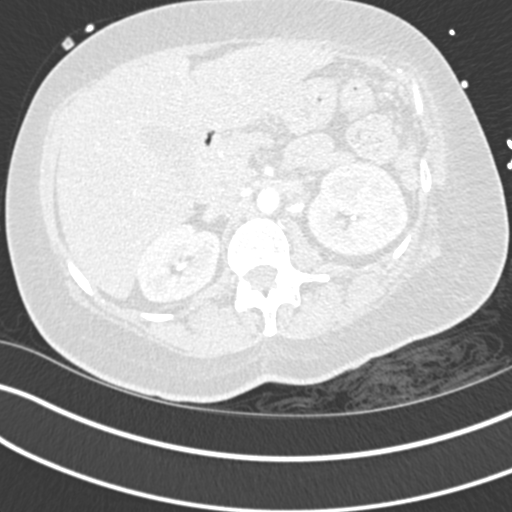
[im 38/371  soft-tissue]
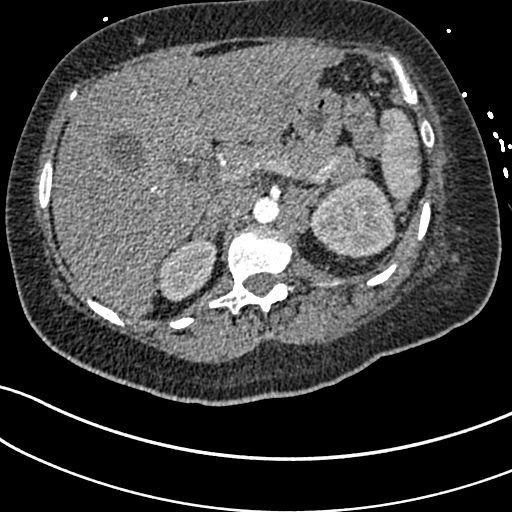
[im 75/371  lung]
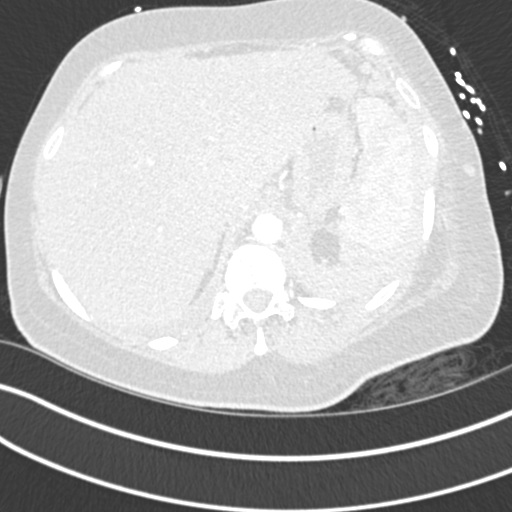
[im 93/371  soft-tissue]
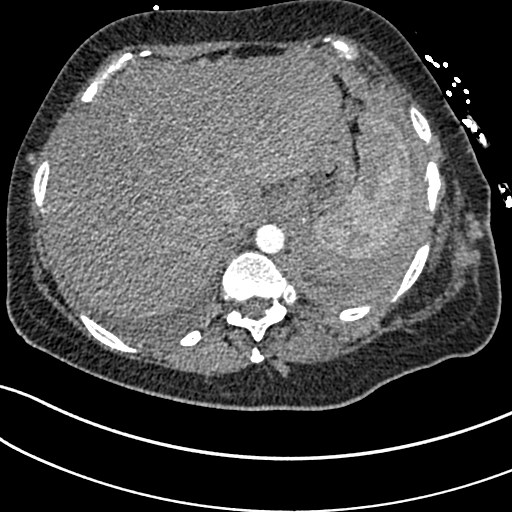
[im 112/371  lung]
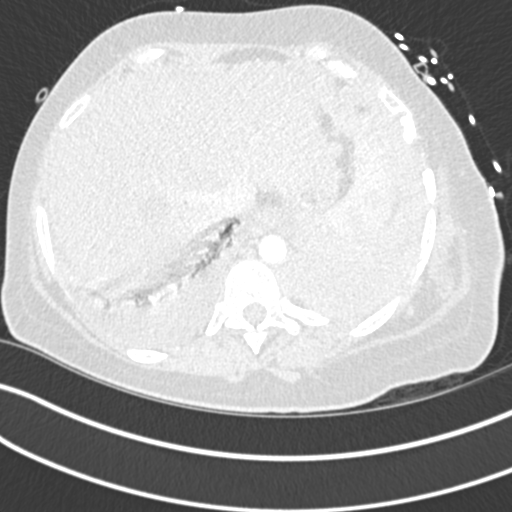
[im 130/371  soft-tissue]
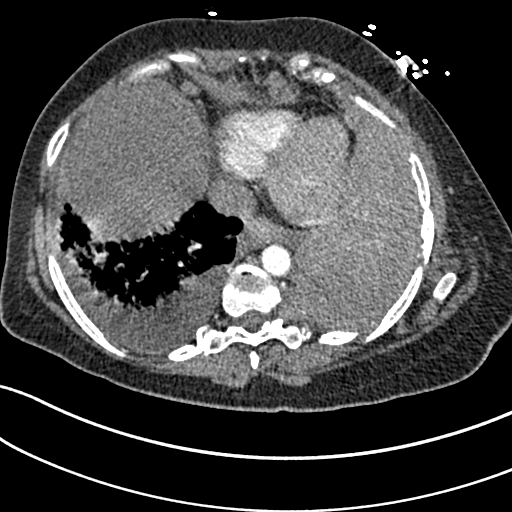
[im 167/371  lung]
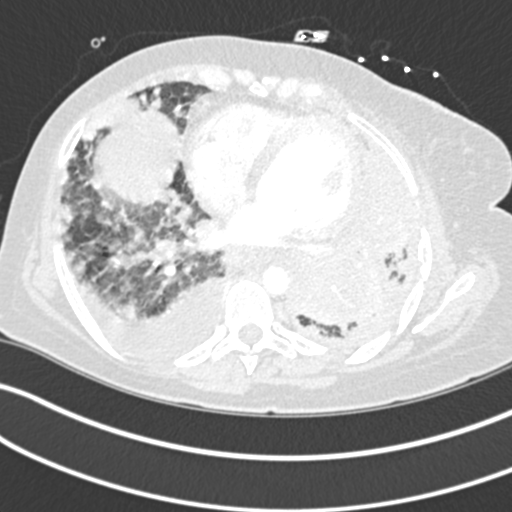
[im 186/371  soft-tissue]
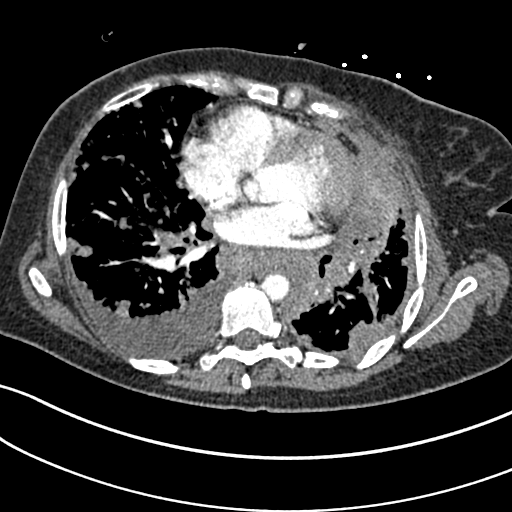
[im 204/371  lung]
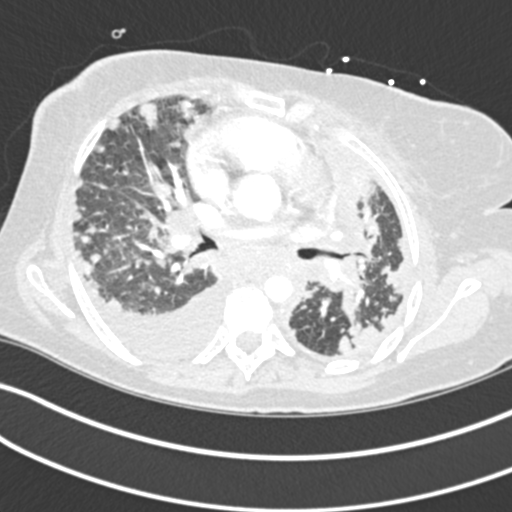
[im 241/371  soft-tissue]
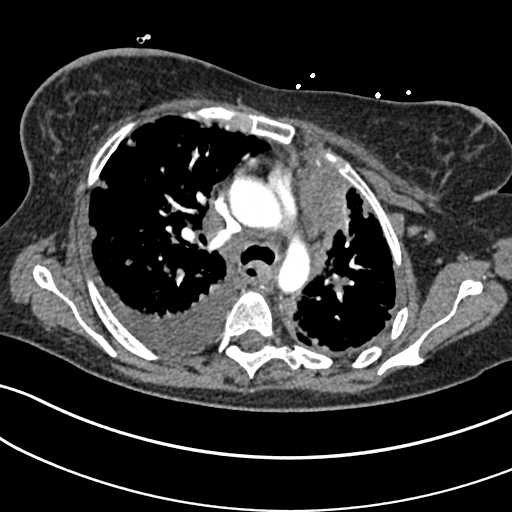
[im 260/371  lung]
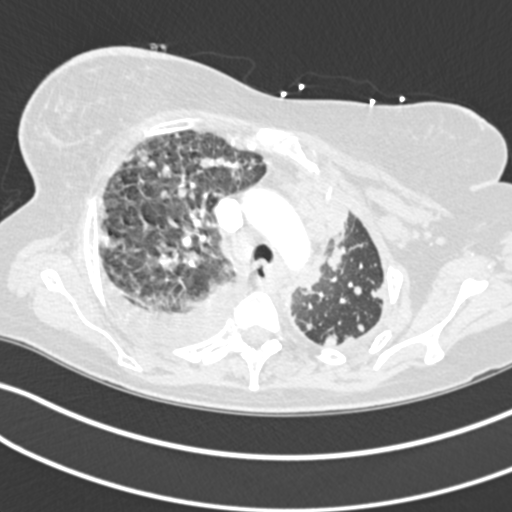
[im 278/371  soft-tissue]
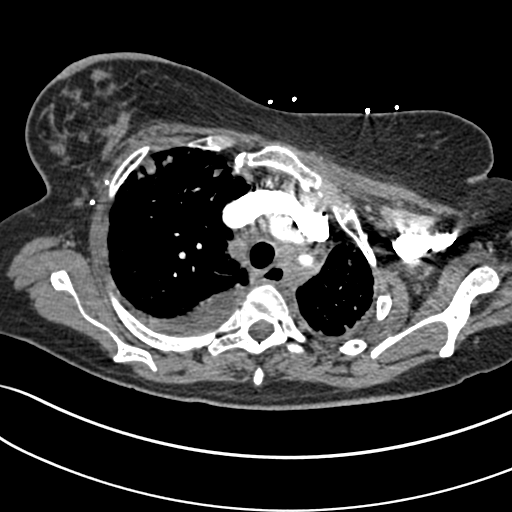
[im 297/371  lung]
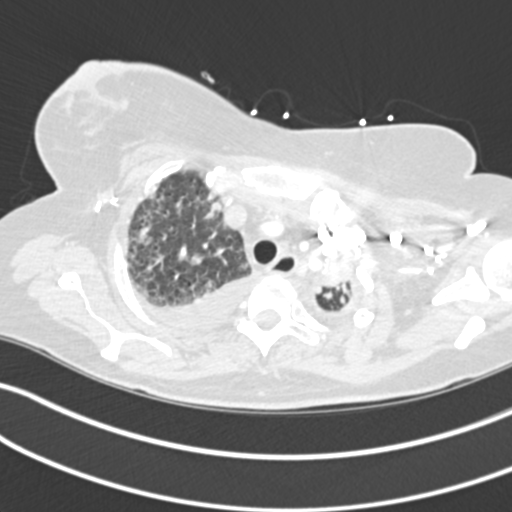
[im 334/371  soft-tissue]
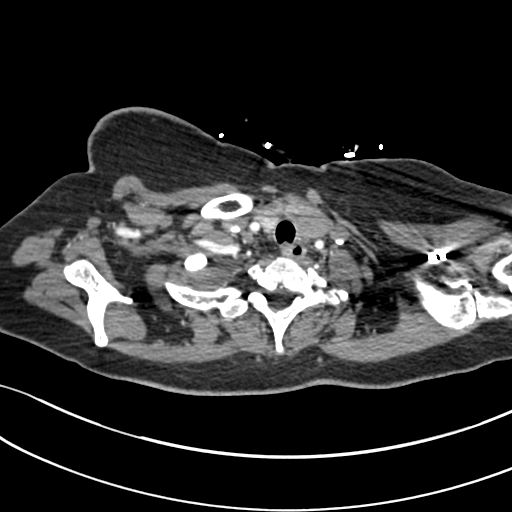
[im 352/371  lung]
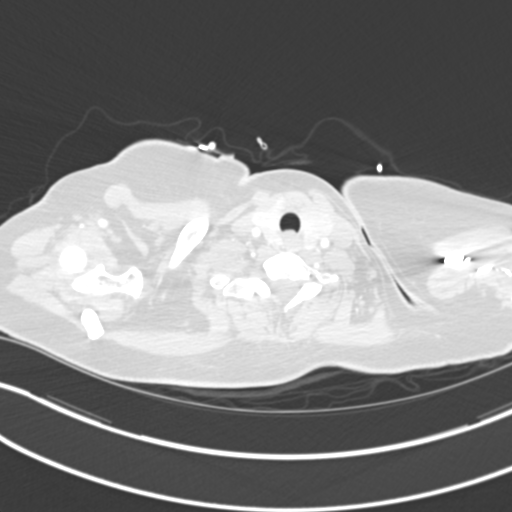

[Series 11: coronal mpr · coronal · 0.59mm/px · 3 of 106 slices shown]
[im 27/106  soft-tissue]
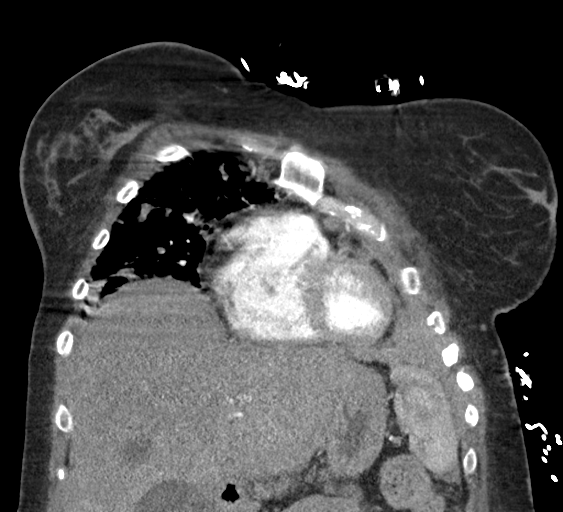
[im 53/106  soft-tissue]
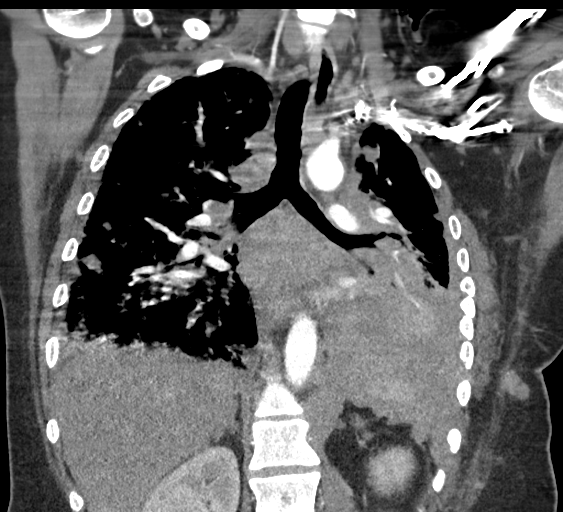
[im 79/106  soft-tissue]
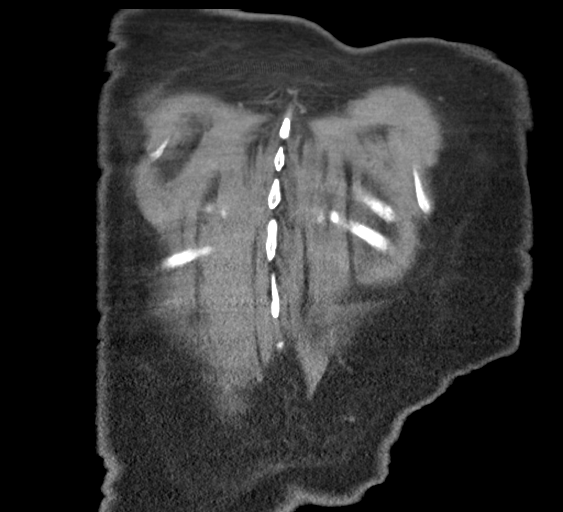

[18 of 46 positions shown; findings below may reference images not displayed]

RADIATION DOSE REDUCTION: This exam was performed according to the
departmental dose-optimization program which includes automated
exposure control, adjustment of the mA and/or kV according to
patient size and/or use of iterative reconstruction technique.

CONTRAST:  75mL OMNIPAQUE IOHEXOL 350 MG/ML SOLN
FINDINGS: Cardiovascular: Satisfactory opacification of the pulmonary arteries
to the segmental level. No evidence of pulmonary embolism. Normal
heart size. No pericardial effusion. No thoracic aortic aneurysm or
dissection.

Mediastinum/Nodes: Mediastinal, bilateral hilar, and left axillary
lymphadenopathy has progressed since the prior study. Progressive
asymmetric enlargement of the left thyroid lobe compared to the
right without discrete nodule. The trachea and esophagus demonstrate
no significant findings.

Lungs/Pleura: Progressive widespread pulmonary and pleural
nodularity, with more extensive pleural disease in the left lung,
particularly along the mediastinum. For example, a left upper lobe
nodule currently measures 1.5 cm, previously 8 mm. Unchanged
hypoenhancing dense consolidation in the lingula and left lower
lobe. New small to moderate right pleural effusion. No pneumothorax.

Upper Abdomen: Hypodense metastases in the liver are not
significantly changed. Extensive peritoneal nodularity in the upper
abdomen has worsened in the interval.

Musculoskeletal: Unchanged extension of pleural metastases into
multiple left-sided ribs with underlying subacute to chronic rib
fractures. Unchanged T9 metastatic lesion.

Review of the MIP images confirms the above findings.
IMPRESSION: 1. No evidence of pulmonary embolism.
2. Disease progression with worsened widespread pulmonary metastatic
disease, lymphangitic carcinomatosis, intrathoracic lymphadenopathy,
and upper abdominal peritoneal carcinomatosis.
3. New small to moderate right pleural effusion.

## 2021-03-25 IMAGING — CT CT HEAD W/O CM
4 of 5 series · 17 of 47 positions shown, 18 images · non-contrast
Comparison: Brain MRI [DATE]

CLINICAL DATA: Mental status change, unknown cause

Stage IV breast cancer.
EXAM:
CT HEAD WITHOUT CONTRAST
TECHNIQUE: Contiguous axial images were obtained from the base of the skull
through the vertex without intravenous contrast.
RADIATION DOSE REDUCTION: This exam was performed according to the
departmental dose-optimization program which includes automated
exposure control, adjustment of the mA and/or kV according to
patient size and/or use of iterative reconstruction technique.

[Series 2: head bone · axial · 0.42mm/px · z∈[+317,+437]mm · 8 of 74 slices shown]
[im 7/74  bone]
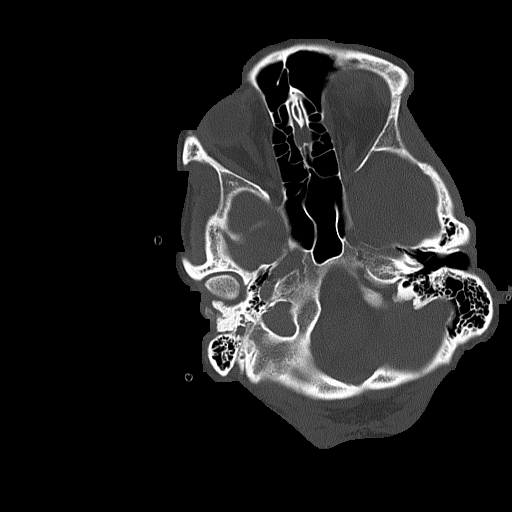
[im 19/74  bone]
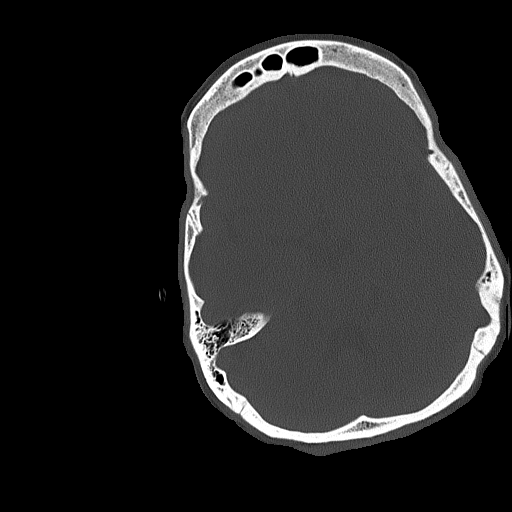
[im 25/74  bone]
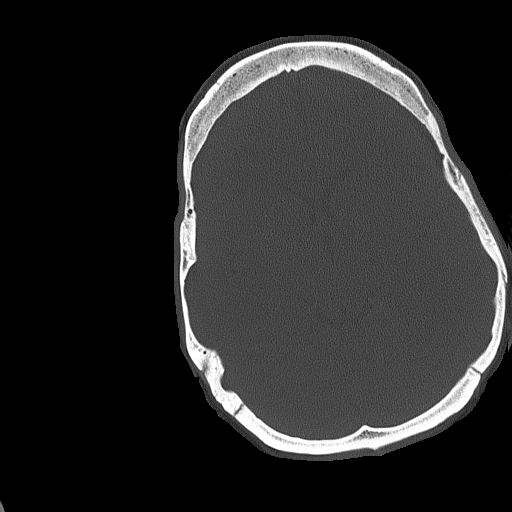
[im 31/74  bone]
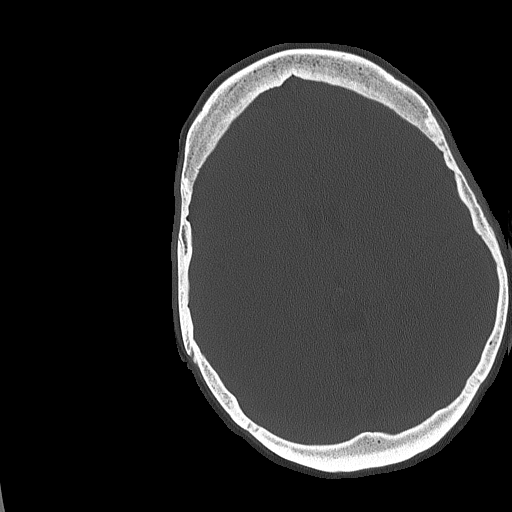
[im 43/74  bone]
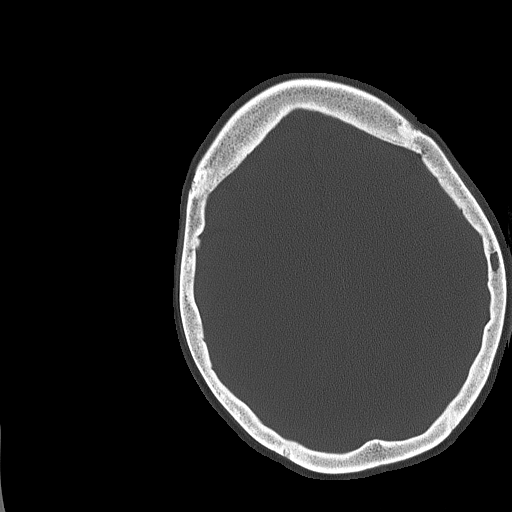
[im 49/74  bone]
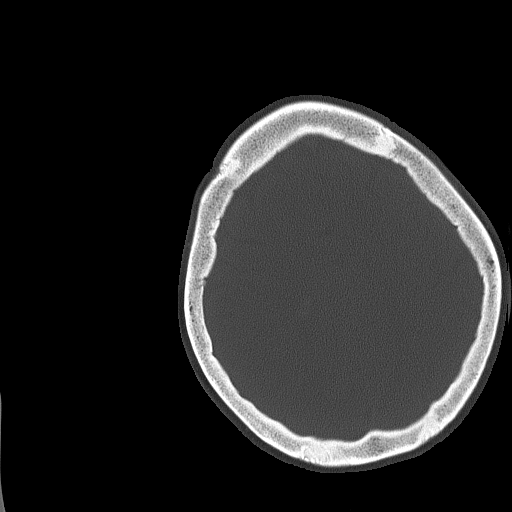
[im 55/74  bone]
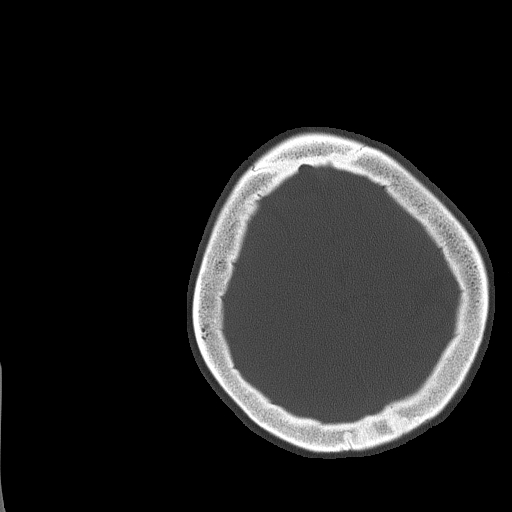
[im 67/74  bone]
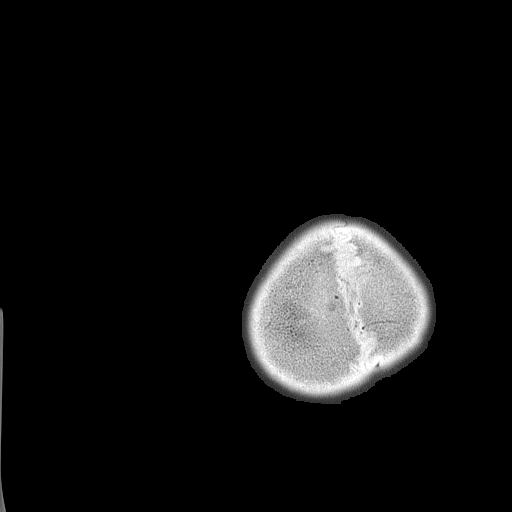

[Series 3: head wo · axial · 0.42mm/px · z∈[+340,+410]mm · 3 of 30 slices shown, 4 images]
[im 8/30  brain]
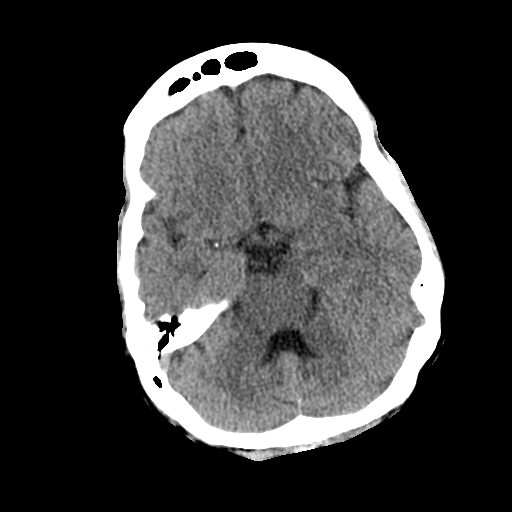
[im 8/30  bone]
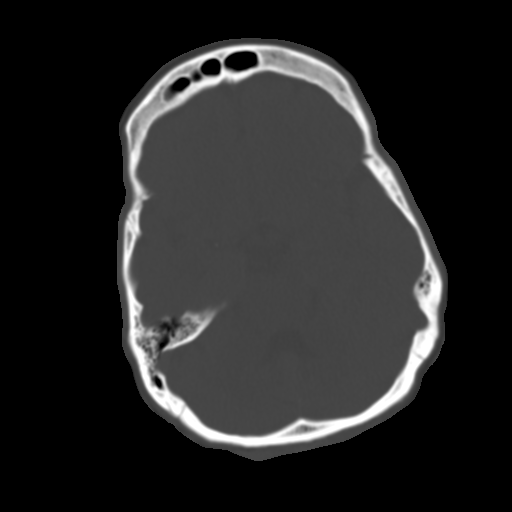
[im 15/30  brain]
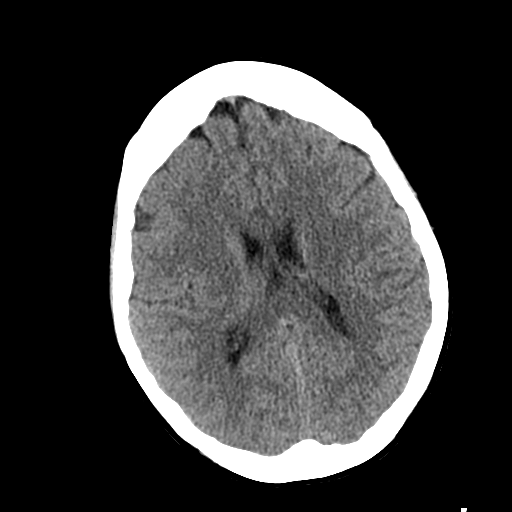
[im 22/30  brain]
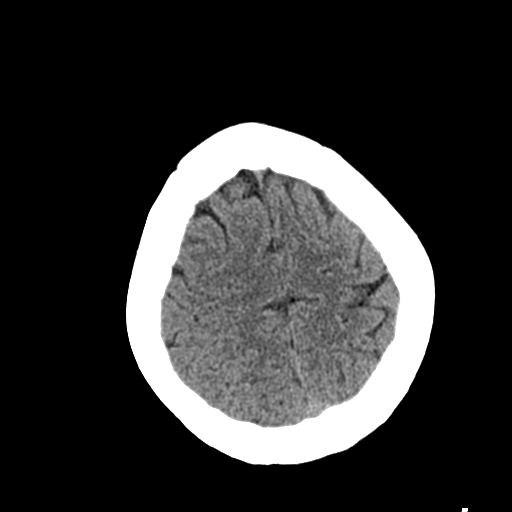

[Series 5: coronal soft tissue · coronal · 0.29mm/px · 3 of 66 slices shown]
[im 22/66  brain]
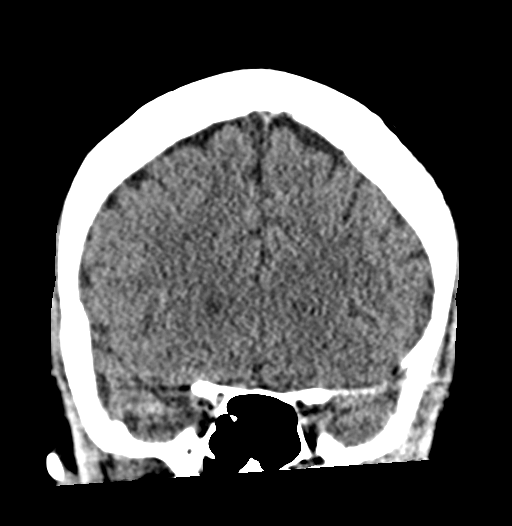
[im 29/66  brain]
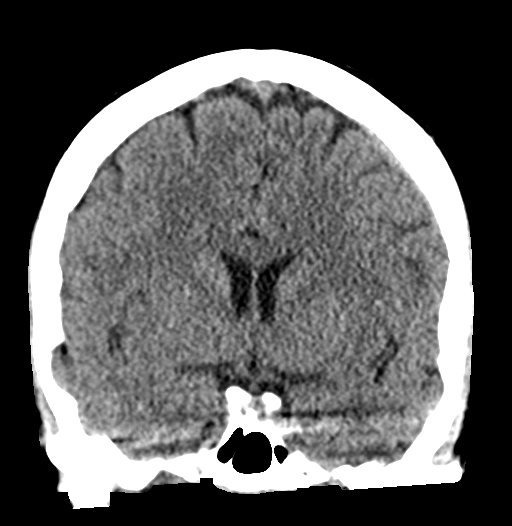
[im 37/66  brain]
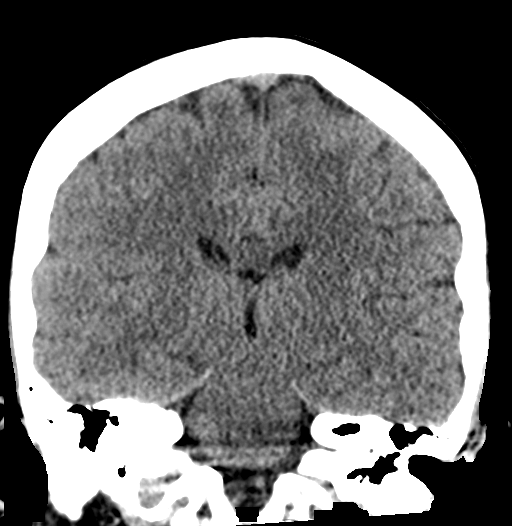

[Series 6: sagittal soft tissue · sagittal · 0.30mm/px · 3 of 51 slices shown]
[im 17/51  brain]
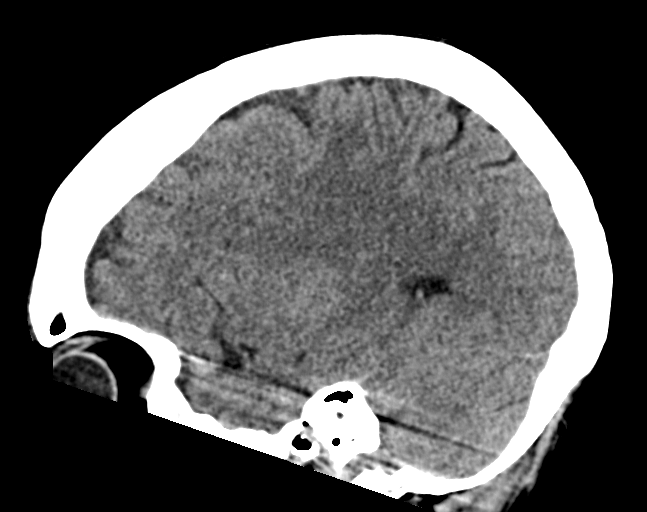
[im 26/51  brain]
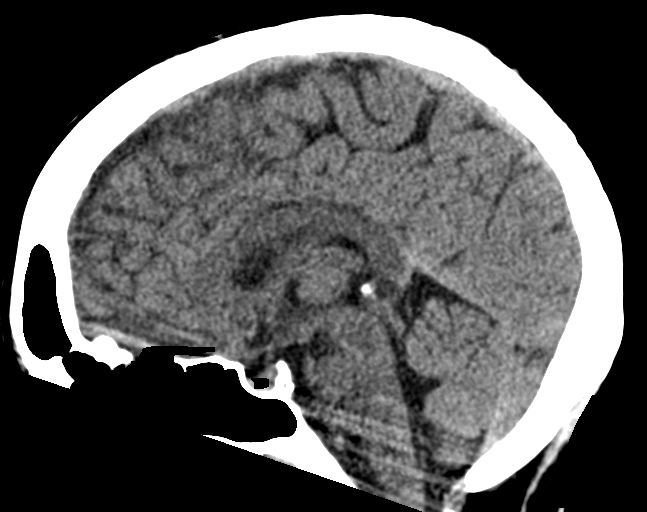
[im 34/51  brain]
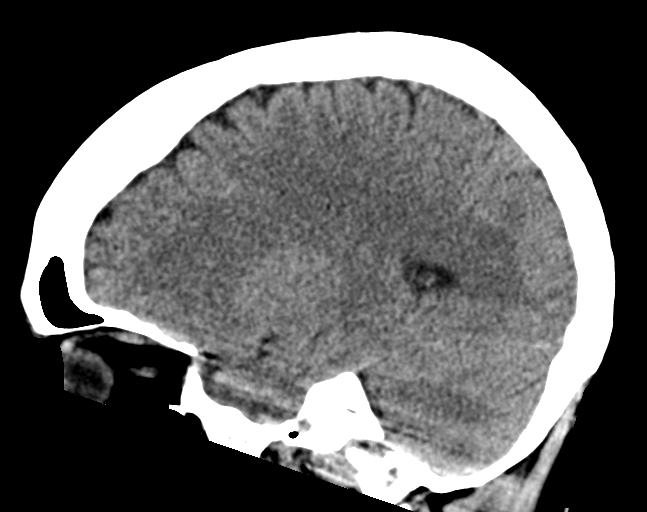

[17 of 47 positions shown; findings below may reference images not displayed]

FINDINGS: Brain: There is a 10 x 6 mm hyperdense focus in the posterior right
frontal lobe, series 3, image 20. No surrounding edema. Multiple
additional tiny punctate hyperdense foci are seen throughout the
brain parenchyma including right frontal lobe series 3, image 13,
right occipital and left periventricular frontal lobe both lungs
series 3, image 16. None of these lesions of significant surrounding
edema or mass effect. There is no subdural or extra-axial
collection. No evidence of acute or territorial ischemia. No
hydrocephalus. No midline shift.

Vascular: No hyperdense vessel or unexpected calcification.

Skull: There is a small sclerotic focus within the anterior left
temporal bone, nonspecific. No skull fracture.

Sinuses/Orbits: Paranasal sinuses and mastoid air cells are clear.
The visualized orbits are unremarkable.

Other: None.
IMPRESSION: 1. Multiple punctate hyperdense foci throughout the brain
parenchyma, largest measuring 10 x 6 mm in the posterior right
frontal lobe. Findings suspicious for hemorrhagic/proteinaceous
metastatic disease, less likely multifocal small parenchymal
hemorrhages. Recommend brain MRI with and without contrast for
further characterization.
2. Small sclerotic focus within the anterior left temporal bone,
nonspecific.

These results were called by telephone at the time of interpretation
on [DATE] at [DATE] to provider TONY , who verbally
acknowledged these results.

## 2021-03-25 MED ORDER — LEVETIRACETAM 500 MG PO TABS: 500.0000 mg | ORAL_TABLET | Freq: Two times a day (BID) | ORAL | Status: DC

## 2021-03-25 MED ORDER — VENLAFAXINE HCL ER 75 MG PO CP24
150.0000 mg | ORAL_CAPSULE | Freq: Every day | ORAL | Status: DC
  Administered 2021-03-26: 150 mg via ORAL
  Filled 2021-03-25: qty 2

## 2021-03-25 MED ORDER — SODIUM CHLORIDE 0.9 % IV SOLN
2.0000 g | Freq: Once | INTRAVENOUS | Status: AC
  Administered 2021-03-25: 2 g via INTRAVENOUS
  Filled 2021-03-25: qty 2

## 2021-03-25 MED ORDER — SODIUM CHLORIDE 0.9 % IV SOLN: INTRAVENOUS | Status: AC

## 2021-03-25 MED ORDER — DEXAMETHASONE SODIUM PHOSPHATE 10 MG/ML IJ SOLN
10.0000 mg | Freq: Once | INTRAMUSCULAR | Status: AC
  Administered 2021-03-25: 10 mg via INTRAVENOUS
  Filled 2021-03-25: qty 1

## 2021-03-25 MED ORDER — SODIUM CHLORIDE 0.9 % IV BOLUS
1000.0000 mL | Freq: Once | INTRAVENOUS | Status: AC
Start: 2021-03-25 — End: 2021-03-25
  Administered 2021-03-25: 1000 mL via INTRAVENOUS

## 2021-03-25 MED ORDER — VANCOMYCIN HCL IN DEXTROSE 1-5 GM/200ML-% IV SOLN
1000.0000 mg | Freq: Once | INTRAVENOUS | Status: AC
  Administered 2021-03-25: 1000 mg via INTRAVENOUS
  Filled 2021-03-25: qty 200

## 2021-03-25 MED ORDER — IOHEXOL 350 MG/ML SOLN
75.0000 mL | Freq: Once | INTRAVENOUS | Status: AC | PRN
  Administered 2021-03-25: 75 mL via INTRAVENOUS

## 2021-03-25 MED ORDER — ZOLEDRONIC ACID 4 MG/100ML IV SOLN
4.0000 mg | Freq: Once | INTRAVENOUS | Status: DC
  Filled 2021-03-25: qty 100

## 2021-03-25 MED ORDER — POTASSIUM CHLORIDE 10 MEQ/100ML IV SOLN
10.0000 meq | INTRAVENOUS | Status: AC
  Administered 2021-03-25: 10 meq via INTRAVENOUS
  Filled 2021-03-25 (×2): qty 100

## 2021-03-25 MED ORDER — LACTATED RINGERS IV SOLN: INTRAVENOUS | Status: DC

## 2021-03-25 MED ORDER — DEXAMETHASONE SODIUM PHOSPHATE 4 MG/ML IJ SOLN
4.0000 mg | Freq: Four times a day (QID) | INTRAMUSCULAR | Status: DC
  Administered 2021-03-25 – 2021-03-27 (×8): 4 mg via INTRAVENOUS
  Filled 2021-03-25 (×8): qty 1

## 2021-03-25 MED ORDER — METOPROLOL TARTRATE 25 MG PO TABS
25.0000 mg | ORAL_TABLET | Freq: Two times a day (BID) | ORAL | Status: DC
  Administered 2021-03-25 – 2021-03-26 (×2): 25 mg via ORAL
  Filled 2021-03-25 (×2): qty 1

## 2021-03-25 MED ORDER — HEPARIN SODIUM (PORCINE) 5000 UNIT/ML IJ SOLN
5000.0000 [IU] | Freq: Three times a day (TID) | INTRAMUSCULAR | Status: DC
  Administered 2021-03-25 – 2021-03-27 (×5): 5000 [IU] via SUBCUTANEOUS
  Filled 2021-03-25 (×3): qty 1

## 2021-03-25 MED ORDER — SODIUM CHLORIDE 0.9 % IV SOLN
500.0000 mg | Freq: Once | INTRAVENOUS | Status: AC
  Administered 2021-03-25: 500 mg via INTRAVENOUS
  Filled 2021-03-25: qty 5

## 2021-03-25 MED ORDER — ZOLEDRONIC ACID 4 MG/5ML IV CONC
4.0000 mg | Freq: Once | INTRAVENOUS | Status: AC
  Administered 2021-03-25: 4 mg via INTRAVENOUS
  Filled 2021-03-25: qty 5

## 2021-03-25 MED ORDER — LEVETIRACETAM IN NACL 500 MG/100ML IV SOLN
500.0000 mg | Freq: Two times a day (BID) | INTRAVENOUS | Status: DC
  Administered 2021-03-25 – 2021-03-27 (×4): 500 mg via INTRAVENOUS
  Filled 2021-03-25 (×8): qty 100

## 2021-03-25 MED ORDER — CHLORHEXIDINE GLUCONATE CLOTH 2 % EX PADS
6.0000 | MEDICATED_PAD | Freq: Every day | CUTANEOUS | Status: DC
  Administered 2021-03-25 – 2021-03-26 (×2): 6 via TOPICAL

## 2021-03-25 MED ORDER — VANCOMYCIN HCL 750 MG/150ML IV SOLN
750.0000 mg | Freq: Two times a day (BID) | INTRAVENOUS | Status: DC
  Administered 2021-03-25: 750 mg via INTRAVENOUS
  Filled 2021-03-25 (×2): qty 150

## 2021-03-25 MED ORDER — SODIUM CHLORIDE 0.9 % IV SOLN
2.0000 g | Freq: Three times a day (TID) | INTRAVENOUS | Status: DC
  Administered 2021-03-26: 2 g via INTRAVENOUS
  Filled 2021-03-25 (×2): qty 2

## 2021-03-25 MED ORDER — SODIUM CHLORIDE 0.9 % IV SOLN
500.0000 mg | Freq: Every day | INTRAVENOUS | Status: DC
  Filled 2021-03-25: qty 5

## 2021-03-25 MED ORDER — SODIUM CHLORIDE 0.9% FLUSH
3.0000 mL | Freq: Two times a day (BID) | INTRAVENOUS | Status: DC
  Administered 2021-03-25 – 2021-03-28 (×5): 3 mL via INTRAVENOUS

## 2021-03-25 MED ORDER — CALCITONIN (SALMON) 200 UNIT/ML IJ SOLN
4.0000 [IU]/kg | Freq: Once | INTRAMUSCULAR | Status: AC
Start: 2021-03-25 — End: 2021-03-25
  Administered 2021-03-25: 282 [IU] via INTRAMUSCULAR
  Filled 2021-03-25: qty 1.41

## 2021-03-25 MED ORDER — CLONAZEPAM 0.5 MG PO TBDP
0.5000 mg | ORAL_TABLET | Freq: Two times a day (BID) | ORAL | Status: DC | PRN
  Administered 2021-03-25 – 2021-03-26 (×3): 0.5 mg via ORAL
  Filled 2021-03-25 (×4): qty 1

## 2021-03-25 NOTE — H&P (Signed)
?History and Physical  ? ? ?Patient: Kaylee Romero DOB: 12-09-1980 ?DOA: 03/09/2021 ?DOS: the patient was seen and examined on 03/26/2021 ?PCP: Dion Body, MD  ?Patient coming from: Home ? ?Chief Complaint:  ?Chief Complaint  ?Patient presents with  ? Shortness of Breath  ? ?HPI: Kaylee Romero is a 41 y.o. female with medical history significant of breast cancer and  GAD coming to [place] with c/o AMS lethargy since past 3 days and not eating or drinking well. ? ? ?Review of Systems: Review of Systems  ?Unable to perform ROS: Acuity of condition  ? ? ?Past Medical History:  ?Diagnosis Date  ? Chronic constipation   ? Chronic narcotic use   ? Chronic nausea   ? due to taking chemo drug  ? Family history of breast cancer   ? GAD (generalized anxiety disorder)   ? History of pregnancy induced hypertension   ? HSV (herpes simplex virus) anogenital infection   ? positive titer only  ? 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 Malignant neoplasm of upper-outer quadrant of right breast in female, estrogen receptor positive ([place])   ? oncologist--- dr Grayland Ormond;;  first dx 09/ 2020 s/p right lumpectomy w/ node dissection and completed chemoradiation 08/ 2021;;  recurrent 07/ 2022 to lung pleura,liver, and thoracic spine  ? SOB (shortness of breath)   ? 02-12-2021  pt denies with regular activity but sob with stairs  ? ?Past Surgical History:  ?Procedure Laterality Date  ? ABDOMINAL HYSTERECTOMY N/A 02/17/2021  ? BREAST LUMPECTOMY WITH AXILLARY LYMPH NODE BIOPSY Right 11/21/2018  ? Procedure: RIGHT BREAST LUMPECTOMY WITH SENTINEL LYMPH NODE BIOPSY;  Surgeon: Jovita Kussmaul, MD;  Location: [facility];  Service: General;  Laterality: Right;  ? BREAST REDUCTION SURGERY Bilateral 11/21/2018  ? Procedure: BILATERAL MAMMARY REDUCTION  (BREAST);  Surgeon: Wallace Going, DO;  Location: [facility];  Service: Plastics;  Laterality: Bilateral;  ? CHEST TUBE INSERTION Left 07/29/2020  ?  Procedure: INSERTION PLEURAL DRAINAGE CATHETER;  Surgeon: Lajuana Matte, MD;  Location: [facility];  Service: Thoracic;  Laterality: Left;  ? IRRIGATION AND DEBRIDEMENT STERNOCLAVICULAR JOINT-STERNUM AND RIBS N/A 08/13/2020  ? Procedure: IRRIGATION AND DEBRIDEMENT CHEST WALL ABSCESS;  Surgeon: Lajuana Matte, MD;  Location: [facility];  Service: Cardiothoracic;  Laterality: N/A;  ? IUD REMOVAL N/A 02/17/2021  ? Procedure: INTRAUTERINE DEVICE (IUD) REMOVAL;  Surgeon: Linda Hedges, DO;  Location: [facility];  Service: Gynecology;  Laterality: N/A;  ? LAPAROSCOPIC SALPINGO OOPHERECTOMY Bilateral 02/17/2021  ? Procedure: LAPAROSCOPIC BILATERAL SALPINGO OOPHORECTOMY;  Surgeon: Linda Hedges, DO;  Location: [facility];  Service: Gynecology;  Laterality: Bilateral;  ? LEFT HEART CATH AND CORONARY ANGIOGRAPHY N/A 02/25/2021  ? Procedure: LEFT HEART CATH AND CORONARY ANGIOGRAPHY possible PCI and stent;  Surgeon: Yolonda Kida, MD;  Location: [facility];  Service: Cardiovascular;  Laterality: N/A;  ? PLEURAL BIOPSY Left 07/29/2020  ? Procedure: PLEURAL BIOPSY;  Surgeon: Lajuana Matte, MD;  Location: [facility];  Service: Thoracic;  Laterality: Left;  ? PLEURAL EFFUSION DRAINAGE Left 07/29/2020  ? Procedure: DRAINAGE OF PLEURAL EFFUSION;  Surgeon: Lajuana Matte, MD;  Location: [facility];  Service: Thoracic;  Laterality: Left;  ? PORT-A-CATH REMOVAL N/A 07/06/2019  ? Procedure: REMOVAL PORT-A-CATH;  Surgeon: Jovita Kussmaul, MD;  Location: [facility];  Service: General;  Laterality: N/A;  ? PORTACATH PLACEMENT N/A 11/21/2018  ? Procedure:  INSERTION LEFT PORT-A-CATH WITH ULTRASOUND GUIDANCE;  Surgeon: Jovita Kussmaul, MD;  Location: [facility];  Service: General;  Laterality: N/A;  ? TONSILLECTOMY  1987  ? VIDEO ASSISTED THORACOSCOPY Left 07/29/2020  ? Procedure: VIDEO ASSISTED THORACOSCOPY;  Surgeon: Lajuana Matte, MD;  Location: [facility];  Service: Thoracic;   Laterality: Left;  ? WISDOM TOOTH EXTRACTION    ? ?Social History:  reports that she quit smoking about 4 years ago. Her smoking use included cigarettes. She started smoking about 19 years ago. She has a 10.00 pack-year smoking history. She has never used smokeless tobacco. She reports current alcohol use. She reports that she does not use drugs. ? ?Allergies  ?Allergen Reactions  ? Betadine [Povidone-Iodine] Rash  ? ? ?Family History  ?Problem Relation Age of Onset  ? Hypertension Father   ? Alcohol abuse Paternal Grandfather   ? Heart disease Paternal Grandfather   ? Alcohol abuse Maternal Grandfather   ? Heart disease Maternal Grandmother   ? Breast cancer Other   ? ? ?Prior to Admission medications   ?Medication Sig Start Date End Date Taking? Authorizing Provider  ?acetaminophen (TYLENOL) 500 MG tablet Take 1,000 mg by mouth every 6 (six) hours as needed.    [provider]  ?albuterol (VENTOLIN HFA) 108 (90 Base) MCG/ACT inhaler INHALE 2 PUFFS INTO THE LUNGS EVERY 6 HOURS AS NEEDED FOR WHEEZING OR SHORTNESS OF BREATH ?Patient taking differently: Inhale 2 puffs into the lungs 2 (two) times daily. 02/05/21   Lloyd Huger, MD  ?amphetamine-dextroamphetamine (ADDERALL XR) 30 MG 24 hr capsule Take 30 mg by mouth every morning. 03/06/21   [provider]  ?capecitabine (XELODA) 500 MG tablet TAKE 4 TABLETS BY MOUTH 2 TIMES DAILY AFTER A MEAL FOR 14 DAYS, THEN HOLD FOR 7 DAYS, REPEAT EVERY 21 DAYS. 03/20/21   Lloyd Huger, MD  ?Cholecalciferol (VITAMIN D3) 50 MCG (2000 UT) TABS Take 1 tablet by mouth daily at 12 noon.    [provider]  ?clonazePAM (KLONOPIN) 0.5 MG tablet Take 0.5-1 tablets (0.25-0.5 mg total) by mouth 2 (two) times daily as needed for anxiety. 03/13/21   Borders, Kirt Boys, NP  ?docusate sodium (COLACE) 100 MG capsule Take 1 capsule (100 mg total) by mouth 2 (two) times daily as needed for mild constipation. 03/13/21   Sidney Ace, MD  ?furosemide (LASIX) 20  MG tablet Take 1 tablet (20 mg total) by mouth daily. 03/13/21 05/12/21  Sidney Ace, MD  ?HYDROmorphone (DILAUDID) 4 MG tablet Take 1 tablet (4 mg total) by mouth every 4 (four) hours as needed for severe pain (breakthrough pain). 03/24/21   Borders, Kirt Boys, NP  ?ibuprofen (ADVIL) 200 MG tablet Take 400 mg by mouth every 6 (six) hours as needed for mild pain.    [provider]  ?ipratropium-albuterol (DUONEB) 0.5-2.5 (3) MG/3ML SOLN Take 3 mLs by nebulization every 4 (four) hours as needed. 02/26/21   Shawna Clamp, MD  ?metoprolol tartrate (LOPRESSOR) 25 MG tablet Take 1 tablet (25 mg total) by mouth 2 (two) times daily. 02/26/21   Shawna Clamp, MD  ?naloxone [facility]) nasal spray 4 mg/0.1 mL SPRAY 1 SPRAY INTO ONE NOSTRIL AS DIRECTED FOR OPIOID OVERDOSE (TURN PERSON ON SIDE AFTER DOSE. IF NO RESPONSE IN 2-3 MINUTES OR PERSON RESPONDS BUT RELAPSES, REPEAT USING A NEW SPRAY DEVICE AND SPRAY INTO THE OTHER NOSTRIL. CALL 911 AFTER USE.) * EMERGENCY USE ONLY * 03/24/21   Borders, Kirt Boys, NP  ?  ondansetron (ZOFRAN) 8 MG tablet Take 1 tablet (8 mg total) by mouth every 8 (eight) hours as needed for nausea or vomiting. 01/30/21   Darl Pikes, RPH-CPP  ?polyethylene glycol (MIRALAX / GLYCOLAX) 17 g packet Take 17 g by mouth daily. ?Patient taking differently: Take 17 g by mouth daily as needed for mild constipation. 08/15/20   Ezekiel Slocumb, DO  ?promethazine (PHENERGAN) 25 MG tablet Take 1 tablet (25 mg total) by mouth every 6 (six) hours as needed for nausea or vomiting. 02/20/21   Lloyd Huger, MD  ?Tetrahydrozoline HCl (VISINE OP) Place 1 drop into both eyes daily as needed (redness).    [provider]  ?venlafaxine XR (EFFEXOR-XR) 150 MG 24 hr capsule TAKE 1 CAPSULE(150 MG) BY MOUTH DAILY WITH BREAKFAST 03/10/21   Lloyd Huger, MD  ? ? ?Physical Exam: ?Vitals:  ? 04/03/2021 1930 03/31/2021 2040 03/23/2021 2100 03/07/2021 2200  ?BP: 136/83 (!) 155/97 (!) 141/85 137/74  ?Pulse: 98  (!) 103 (!) 101 99  ?Resp: 14 (!) 30    ?Temp:  (!) 97.5 ?F (36.4 ?C)    ?TempSrc:  Oral    ?SpO2: 97% 91% 94% 97%  ?Weight:  74.6 kg    ?Height:  '5\' 4"'$  (1.626 m)    ? ?Physical Exam ?Vitals and nursing note r

## 2021-03-25 NOTE — ED Notes (Signed)
Physician at bedside.

## 2021-03-25 NOTE — ED Triage Notes (Signed)
Pt BIBA for SOB with lethargy since 1100. Stage 4 breast CA with chemo and radiaiton ?

## 2021-03-25 NOTE — Sepsis Progress Note (Signed)
Notified bedside nurse of need to administer antibiotics and collect a repeat lactic acid after completion of fluid bolus.  ?

## 2021-03-25 NOTE — Assessment & Plan Note (Addendum)
Sepsis ruled out.  Leukocytosis most likely secondary to disease burden and now worsening with steroid.  Barely positive procalcitonin and no source of infection.  Preliminary cultures negative. ?-Stop antibiotics and observe ? ?

## 2021-03-25 NOTE — Assessment & Plan Note (Addendum)
Magnesium levels at 1.5 ?Replace potassium and magnesium. ?Continue to monitor ? ? ?

## 2021-03-25 NOTE — Assessment & Plan Note (Addendum)
Pt coming to [place] with lethargy and somnolent state attribute to combination of hypercalcemia and brain mets. ?Hypercalcemia improving but she remained very anxious.  She was also placed on Decadron and Keppra per neurosurgery, Decadron might be contributing to her anxiety along with uncontrolled pain. ?Talk with neurosurgery and they would like to continue Keppra and deferring the decision about Decadron to radiation oncology-message was sent. ?Started her on Precedex infusion for uncontrolled anxiety as she was not responding to Ativan. ?-Continue to monitor ?-Patient is very high risk for deterioration and death. ?

## 2021-03-25 NOTE — Progress Notes (Signed)
[place] ?Clinical Social Work ? ?Clinical Social Work was referred by medical provider for assessment of psychosocial needs.  Clinical Social Worker contacted patient by phone  to offer support and assess for needs.  CSW left voicemail with contact information and request for return call. ? ? ?Morrigan Wickens, LCSW  ?Clinical Social Worker ?[place] ? ?Third Attempt  ?

## 2021-03-25 NOTE — Assessment & Plan Note (Addendum)
Hypercalcemia of malignancy.  ?Calcium appropriately trending down. ?Received calcitonin and ZA. ?Monitor on tele in stepdown as pt is high risk for arrhythmias.  ?Monitor telemetry and intervals.  ? ?

## 2021-03-25 NOTE — Assessment & Plan Note (Addendum)
Blood pressure variable, currently within goal. ?-Continue with metoprolol ?-Continue to monitor ? ?

## 2021-03-25 NOTE — Assessment & Plan Note (Addendum)
Patient with worsening anxiety and very restless.  Pain is also contributory.  She was also placed on Decadron which can cause increase in anxiety. ?Message sent to radiation oncology if she has to continue with steroid. ?She was started on Precedex infusion as as needed Ativan was not working. ?-Continue with Precedex infusion for now ?-Continue venlafaxine.  ?

## 2021-03-25 NOTE — Consult Note (Signed)
PHARMACY -  BRIEF ANTIBIOTIC NOTE  ? ?Pharmacy has received consult(s) for Vancomycin and Cefepime from an ED provider.  The patient's profile has been reviewed for ht/wt/allergies/indication/available labs.   ? ?One time order(s) placed for Vancomycin 1g IV and Cefepime 2g IV x 1 dose each. ? ?Further antibiotics/pharmacy consults should be ordered by admitting physician if indicated.       ?                ?Thank you, ?Ruperto Kiernan A Emersyn Kotarski ?03/09/2021  4:59 PM ? ?

## 2021-03-25 NOTE — Sepsis Progress Note (Signed)
ELink tracking the Code Sepsis. 

## 2021-03-25 NOTE — ED Provider Notes (Signed)
? ?[facility] ?Provider Note ? ? ? Event Date/Time  ? First MD Initiated Contact with Patient 04/04/2021 1545   ?  (approximate) ? ? ?History  ? ?Shortness of Breath ? ? ?HPI ? ?Kaylee Romero is a 41 y.o. 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  Patient tells me that her speech and shortness of breath has been going on for several days.  Has had poor appetite.  Has had cough and says she was treated for pneumonia recently and overall her symptoms seem to improve which include pain in bilateral chest as well as just overall feeling well.  Per EMS patient's family around 43 noticed that she was having worsening slurred speech and seemed to be more somnolent.  After reviewing telephone notes recently seems patient had a thoracentesis yesterday and has also been having some somnolence in the setting of taking her p.o. opioid pain medication. ? ?  ? ?Past Medical History:  ?Diagnosis Date  ? Chronic constipation   ? Chronic narcotic use   ? Chronic nausea   ? due to taking chemo drug  ? Family history of breast cancer   ? GAD (generalized anxiety disorder)   ? History of pregnancy induced hypertension   ? HSV (herpes simplex virus) anogenital infection   ? positive titer only  ? Lung metastasis ([place]) 07/2020  ? secondary to primiary breast cancer  ? Malignant neoplasm of upper-outer quadrant of right breast in female, estrogen receptor positive ([place])   ? oncologist--- dr Grayland Ormond;;  first dx 09/ 2020 s/p right lumpectomy w/ node dissection and completed chemoradiation 08/ 2021;;  recurrent 07/ 2022 to lung pleura,liver, and thoracic spine  ? SOB (shortness of breath)   ? 02-12-2021  pt denies with regular activity but sob with stairs  ? ? ?Patient Active Problem List  ? Diagnosis Date Noted  ? AMS (altered mental status) 03/09/2021  ? Breast cancer metastasized to brain  [facility]) 03/22/2021  ? Hypokalemia 03/10/2021  ? 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 Thoracic radiculopathy (T5-T9) (Left) 03/18/2021  ? 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 Chronic thoracic back pain (Left) 03/18/2021  ? 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 Drug tolerance, sequela 03/17/2021  ? Allergy to iodine 03/17/2021  ? Allergy to povidone-iodine topical antiseptic 03/17/2021  ? Chronic pain syndrome 03/17/2021  ? Pharmacologic therapy 03/17/2021  ? Disorder of skeletal system 03/17/2021  ? Problems influencing health status 03/17/2021  ? Chronic use of opiate for therapeutic purpose 03/17/2021  ? Palliative care encounter   ? Hypercalcemia 03/09/2021  ? Depression 03/09/2021  ? NSTEMI (non-ST elevated myocardial infarction) ([place]) 02/25/2021  ? Chest pain 02/24/2021  ? HCAP (healthcare-associated pneumonia) 02/23/2021  ? S/P bilateral salpingo-oophorectomy 02/17/21 02/23/2021  ? Cancer-related pain 02/23/2021  ? Acute dyspnea 02/23/2021  ? External hemorrhoids 11/11/2020  ? Recurrent breast cancer, right ([place]) 11/11/2020  ? Visit for wound check 09/10/2020  ? Chest wall abscess 08/13/2020  ? Sepsis ([place]) 08/09/2020  ? Pleural effusion on left 07/29/2020  ? Pulmonary nodule seen on imaging study 07/01/2020  ? GAD (generalized anxiety disorder) 04/01/2020  ? Essential hypertension 03/06/2020  ? History of breast cancer 03/06/2020  ? Hx of breast surgery 02/28/2019  ? Port-A-Cath in place 02/02/2019  ?  Genetic testing 10/13/2018  ? Malignant neoplasm of upper-outer quadrant of right breast in female, estrogen receptor positive ([place]) 09/29/2018  ? Pregnancy 06/18/2017  ? Low back pain 01/14/2013  ? ADD (attention deficit disorder) 03/24/2012  ? Overweight (BMI 25.0-29.9) 01/14/2012  ? ? ? ?Physical Exam  ?Triage Vital Signs: ?ED Triage Vitals  ?Enc Vitals Group  ?   BP 03/15/2021 1550 103/69  ?   Pulse  Rate 03/27/2021 1550 (!) 108  ?   Resp 03/07/2021 1550 19  ?   Temp 03/21/2021 1555 (!) 97.5 ?F (36.4 ?C)  ?   Temp Source 03/06/2021 1555 Axillary  ?   SpO2 03/13/2021 1550 96 %  ?   Weight 03/17/2021 1551 155 lb (70.3 kg)  ?   Height 03/11/2021 1551 $RemoveBefor'5\' 6"'MjkChqXloxcO$  (1.676 m)  ?   Head Circumference --   ?   Peak Flow --   ?   Pain Score --   ?   Pain Loc --   ?   Pain Edu? --   ?   Excl. in [place]? --   ? ? ?Most recent vital signs: ?Vitals:  ? 03/22/2021 2200 03/15/2021 2300  ?BP: 137/74 133/80  ?Pulse: 99 94  ?Resp:    ?Temp:    ?SpO2: 97% 97%  ? ? ? ?General: Awake, no distress.  ?CV:  Good peripheral perfusion. No ede,a ?Resp:  Normal effort.  Decreased breath sounds on the left compared to right ?Abd:  No distention.  Mild tenderness in the epigastric region and bilateral upper quadrants ?Neuro:             Patient is awake but somewhat somnolent, intermittently eyes rolling back but she does follow commands, speech is somewhat slurred ?Aox3, ?PERRL, EOMI, face symmetric, nml tongue movement  ?5/5 strength in the BL upper and lower extremities  ?Sensation grossly intact in the BL upper and lower extremities  ?Finger-nose-finger intact BL ? ?Other:   ? ? ?ED Results / Procedures / Treatments  ?Labs ?(all labs ordered are listed, but only abnormal results are displayed) ?Labs Reviewed  ?COMPREHENSIVE METABOLIC PANEL - Abnormal; Notable for the following components:  ?    Result Value  ? Potassium 3.3 (*)   ? Chloride 93 (*)   ? CO2 34 (*)   ? BUN 25 (*)   ? Calcium 14.5 (*)   ? Albumin 3.2 (*)   ? AST 234 (*)   ? ALT 70 (*)   ? Alkaline Phosphatase 132 (*)   ? All other components within normal limits  ?CBC WITH DIFFERENTIAL/PLATELET - Abnormal; Notable for the following components:  ? WBC 21.5 (*)   ? RBC 3.76 (*)   ? Neutro Abs 18.7 (*)   ? Abs Immature Granulocytes 0.25 (*)   ? All other components within normal limits  ?LACTIC ACID, PLASMA - Abnormal; Notable for the following components:  ? Lactic Acid, Venous 3.2 (*)   ? All other  components within normal limits  ?URINALYSIS, COMPLETE (UACMP) WITH MICROSCOPIC - Abnormal; Notable for the following components:  ? Color, Urine YELLOW (*)   ? APPearance HAZY (*)   ? Protein, ur 30 (*)   ? Bacteria, UA RARE (*)   ? All other components within normal limits  ?CALCIUM - Abnormal; Notable for the following components:  ? Calcium 12.6 (*)   ? All other components within normal limits  ?COMPREHENSIVE METABOLIC PANEL - Abnormal; Notable for the following components:  ? Potassium 3.1 (*)   ?  Glucose, Bld 107 (*)   ? BUN 23 (*)   ? Calcium 12.7 (*)   ? Total Protein 6.3 (*)   ? Albumin 2.8 (*)   ? AST 203 (*)   ? ALT 64 (*)   ? All other components within normal limits  ?GLUCOSE, CAPILLARY - Abnormal; Notable for the following components:  ? Glucose-Capillary 109 (*)   ? All other components within normal limits  ?TROPONIN I (HIGH SENSITIVITY) - Abnormal; Notable for the following components:  ? Troponin I (High Sensitivity) 30 (*)   ? All other components within normal limits  ?TROPONIN I (HIGH SENSITIVITY) - Abnormal; Notable for the following components:  ? Troponin I (High Sensitivity) 27 (*)   ? All other components within normal limits  ?MRSA NEXT GEN BY PCR, NASAL  ?CULTURE, BLOOD (ROUTINE X 2)  ?CULTURE, BLOOD (ROUTINE X 2)  ?RESP PANEL BY RT-PCR (FLU A&B, COVID) ARPGX2  ?BRAIN NATRIURETIC PEPTIDE  ?LACTIC ACID, PLASMA  ?LIPASE, BLOOD  ?MAGNESIUM  ?TSH  ?PROTIME-INR  ?CORTISOL-AM, BLOOD  ?PROCALCITONIN  ?CALCIUM  ?CALCIUM  ?PTH, INTACT AND CALCIUM  ?PHOSPHORUS  ?MAGNESIUM  ? ? ? ?EKG ? ?EKG interpreted by myself, sinus tachycardia, normal axis normal intervals, diffuse T wave flattening, no acute ischemic changes ? ? ?RADIOLOGY ?I reviewed the chest x-ray which shows left-sided pleural effusion and diffuse opacities on the right lung ? ?Reviewed the CT head which shows multiple likely metastatic lesions ? ? ?PROCEDURES: ? ?Critical Care performed: Yes, see critical care procedure note(s) ? ?.1-3  Lead EKG Interpretation ?Performed by: Rada Hay, MD ?Authorized by: Rada Hay, MD  ? ?  Interpretation: normal   ?  ECG rate assessment: normal   ?  Ectopy: none   ?  Conduction: normal   ?.

## 2021-03-25 NOTE — Consult Note (Signed)
CODE SEPSIS - PHARMACY COMMUNICATION ? ?**Broad Spectrum Antibiotics should be administered within 1 hour of Sepsis diagnosis** ? ?Time Code Sepsis Called/Page Received: 2620 ? ?Antibiotics Ordered: vancomycin,cefepime,azithromycin ? ?Time of 1st antibiotic administration: 1806 ? ?Additional action taken by pharmacy: none ? ?If necessary, Name of Provider/Nurse Contacted: n/a ? ? ? ?Pearla Dubonnet ,PharmD ?Clinical Pharmacist  ?03/31/2021  6:14 PM ? ?

## 2021-03-25 NOTE — Consult Note (Signed)
PHARMACY CONSULT NOTE - FOLLOW UP ? ?Pharmacy Consult for Electrolyte Monitoring and Replacement  ? ?Recent Labs: ?Potassium (mmol/L)  ?Date Value  ?04/02/2021 3.3 (L)  ? ?Magnesium (mg/dL)  ?Date Value  ?03/09/2021 1.7  ? ?Calcium (mg/dL)  ?Date Value  ?04/02/2021 14.5 (HH)  ? ?Calcium, Total (PTH) (mg/dL)  ?Date Value  ?03/10/2021 10.7 (H)  ? ?Albumin (g/dL)  ?Date Value  ?04/04/2021 3.2 (L)  ? ?Sodium (mmol/L)  ?Date Value  ?03/31/2021 137  ? ?Corrected Ca: 15.1 ? 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 Found to have severe hypercalcemia. Pharmacy has been consulted to monitor and replace electrolytes as needed. ? ?Goal of Therapy:  ?Ca: WNL ?All electrolytes WNL  ? ?Plan:  ?Severe hypercalcemia:  ?Calcitonin 4 units/kg x 1 (faster onset) and Zometa 4 mg x 1  (longer onset) ordered by provider ?Ca levels ordered Q6H. If no adequate response within 6-12 hours, consider increasing dose to 8 units/kg Q6-12H for max duration of 48 hours ?D/c calcitonin when CorrCa < 14 w/o AMS, or CorrCa < 12, or 4 doses ?Do not anticipate effects of Zometa to be seen for 24-72 hours ?IVMF changed from LR to NS as LR contains 3 mEq/L of Calcium ?Hypokalemia ?Will repleat with 10 mEq IV Kcl x 2 doses ?Recheck all other electrolytes with AM labs ? ?Dorothe Pea, PharmD, BCPS ?Clinical Pharmacist   ?04/02/2021 7:52 PM  ?

## 2021-03-25 NOTE — Assessment & Plan Note (Addendum)
Patient with widespread metastatic disease, history of ER positive stage IV breast cancer not responding to current management as disease continue to spread.  Now with multiple brain and skull mets.  Dr. Grayland Ormond is aware ?Plan is to start palliative whole brain radiation on Monday. ?Unable to place port and do liver biopsy as she is unstable. ?PICC line ordered-unable to do at this time due to extreme anxiety. ?Family is waiting for her oncologist visit to make further decisions. ?Very difficult situation and extremely high risk for deterioration and death. ?-Continue with Precedex infusion ?-Continue with morphine and Dilaudid. ?-Continue with Keppra and Decadron for now ?-Monitor for any respiratory compromise, patient is still full code. ?  ?

## 2021-03-25 NOTE — Sepsis Progress Note (Signed)
Notified bedside nurse of need to draw repeat lactic acid. 

## 2021-03-25 NOTE — Consult Note (Signed)
Pharmacy Antibiotic Note ? ?Kaylee Romero is a 41 y.o. female admitted on 03/12/2021 with AMS, sepsis.  Pharmacy has been consulted for vancomycin and cefepime dosing. ? ?Plan: ?Cefepime 2 gram Q8H ?Vancomycin 1000 mg x 1 given in ED ?Initiate Vancomycin 750 mg Q12H. Goal AUC 400-550 ?Estimated AUC 451/Cmin: 13.2 ?Scr 0.93, IBW, Vd 0.72   ? ?Height: '5\' 6"'$  (167.6 cm) ?Weight: 70.3 kg (155 lb) ?IBW/kg (Calculated) : 59.3 ? ?Temp (24hrs), Avg:97.6 ?F (36.4 ?C), Min:97.5 ?F (36.4 ?C), Max:97.7 ?F (36.5 ?C) ? ?Recent Labs  ?Lab 03/20/21 ?1054 03/26/2021 ?1559 03/13/2021 ?1603 03/24/2021 ?1910  ?WBC 19.8* 21.5*  --   --   ?CREATININE 0.71 0.93  --   --   ?LATICACIDVEN  --   --  3.2* 1.8  ?  ?Estimated Creatinine Clearance: 73.8 mL/min (by C-G formula based on SCr of 0.93 mg/dL).   ? ?Allergies  ?Allergen Reactions  ? Betadine [Povidone-Iodine] Rash  ? ? ?Antimicrobials this admission: ?3/21 vancomycin >>  ?3/21 cefepime >>  ?3/21 Azithromycin >> ? ?Dose adjustments this admission: ? ? ?Microbiology results: ?3/21 BCx: sent ?3/21 MRSA PCR: ordered ? ?Thank you for allowing pharmacy to be a part of this patient?s care. ? ?Dorothe Pea, PharmD, BCPS ?Clinical Pharmacist   ?03/18/2021 7:47 PM ? ?

## 2021-03-26 ENCOUNTER — Ambulatory Visit: Payer: BC Managed Care – PPO

## 2021-03-26 ENCOUNTER — Ambulatory Visit: Admission: RE | Admit: 2021-03-26 | Payer: BC Managed Care – PPO | Source: Ambulatory Visit

## 2021-03-26 ENCOUNTER — Telehealth: Payer: Self-pay | Admitting: *Deleted

## 2021-03-26 ENCOUNTER — Other Ambulatory Visit: Payer: Self-pay

## 2021-03-26 ENCOUNTER — Inpatient Hospital Stay: Payer: Self-pay

## 2021-03-26 ENCOUNTER — Ambulatory Visit: Payer: BC Managed Care – PPO | Admitting: Radiation Oncology

## 2021-03-26 ENCOUNTER — Inpatient Hospital Stay: Payer: BC Managed Care – PPO

## 2021-03-26 ENCOUNTER — Ambulatory Visit: Admission: RE | Admit: 2021-03-26 | Payer: BC Managed Care – PPO | Source: Ambulatory Visit | Admitting: Radiology

## 2021-03-26 ENCOUNTER — Other Ambulatory Visit: Payer: Self-pay | Admitting: Oncology

## 2021-03-26 DIAGNOSIS — I10 Essential (primary) hypertension: Secondary | ICD-10-CM | POA: Diagnosis not present

## 2021-03-26 DIAGNOSIS — E876 Hypokalemia: Secondary | ICD-10-CM

## 2021-03-26 DIAGNOSIS — C50919 Malignant neoplasm of unspecified site of unspecified female breast: Secondary | ICD-10-CM | POA: Diagnosis not present

## 2021-03-26 DIAGNOSIS — F411 Generalized anxiety disorder: Secondary | ICD-10-CM | POA: Diagnosis not present

## 2021-03-26 DIAGNOSIS — C7931 Secondary malignant neoplasm of brain: Secondary | ICD-10-CM | POA: Diagnosis not present

## 2021-03-26 DIAGNOSIS — R400 Somnolence: Secondary | ICD-10-CM | POA: Diagnosis not present

## 2021-03-26 LAB — BASIC METABOLIC PANEL
Anion gap: 12 (ref 5–15)
BUN: 20 mg/dL (ref 6–20)
CO2: 25 mmol/L (ref 22–32)
Calcium: 11.3 mg/dL — ABNORMAL HIGH (ref 8.9–10.3)
Chloride: 102 mmol/L (ref 98–111)
Creatinine, Ser: 0.78 mg/dL (ref 0.44–1.00)
GFR, Estimated: >60 mL/min (ref >60)
Glucose, Bld: 100 mg/dL — ABNORMAL HIGH (ref 70–99)
Potassium: 3.4 mmol/L — ABNORMAL LOW (ref 3.5–5.1)
Sodium: 139 mmol/L (ref 135–145)

## 2021-03-26 LAB — RESP PANEL BY RT-PCR (FLU A&B, COVID) ARPGX2
Influenza A by PCR: NEGATIVE
Influenza B by PCR: NEGATIVE
SARS Coronavirus 2 by RT PCR: NEGATIVE

## 2021-03-26 LAB — MAGNESIUM: Magnesium: 1.5 mg/dL — ABNORMAL LOW (ref 1.7–2.4)

## 2021-03-26 LAB — PROCALCITONIN: Procalcitonin: 0.16 ng/mL

## 2021-03-26 LAB — CBC
HCT: 32.8 % — ABNORMAL LOW (ref 36.0–46.0)
Hemoglobin: 10.9 g/dL — ABNORMAL LOW (ref 12.0–15.0)
MCH: 32.5 pg (ref 26.0–34.0)
MCHC: 33.2 g/dL (ref 30.0–36.0)
MCV: 97.9 fL (ref 80.0–100.0)
Platelets: 294 10*3/uL (ref 150–400)
RBC: 3.35 MIL/uL — ABNORMAL LOW (ref 3.87–5.11)
RDW: 15 % (ref 11.5–15.5)
WBC: 29.6 10*3/uL — ABNORMAL HIGH (ref 4.0–10.5)
nRBC: 0 % (ref 0.0–0.2)

## 2021-03-26 LAB — PROTIME-INR
INR: 1.1 (ref 0.8–1.2)
INR: 1.1 (ref 0.8–1.2)
Prothrombin Time: 14.5 seconds (ref 11.4–15.2)
Prothrombin Time: 13.8 seconds (ref 11.4–15.2)

## 2021-03-26 LAB — PHOSPHORUS: Phosphorus: 2 mg/dL — ABNORMAL LOW (ref 2.5–4.6)

## 2021-03-26 LAB — CORTISOL-AM, BLOOD: Cortisol - AM: 3.3 ug/dL — ABNORMAL LOW (ref 6.7–22.6)

## 2021-03-26 LAB — CALCIUM: Calcium: 12.1 mg/dL — ABNORMAL HIGH (ref 8.9–10.3)

## 2021-03-26 MED ORDER — GADOBUTROL 1 MMOL/ML IV SOLN
7.5000 mL | Freq: Once | INTRAVENOUS | Status: AC | PRN
  Administered 2021-03-26: 7.5 mL via INTRAVENOUS

## 2021-03-26 MED ORDER — HYDROMORPHONE HCL 1 MG/ML IJ SOLN: 2.0000 mg | Freq: Once | INTRAMUSCULAR | Status: AC

## 2021-03-26 MED ORDER — LORAZEPAM 2 MG/ML IJ SOLN
1.0000 mg | Freq: Four times a day (QID) | INTRAMUSCULAR | Status: DC | PRN
  Administered 2021-03-26 – 2021-03-27 (×4): 1 mg via INTRAVENOUS
  Filled 2021-03-26 (×5): qty 1

## 2021-03-26 MED ORDER — DEXMEDETOMIDINE HCL IN NACL 400 MCG/100ML IV SOLN
INTRAVENOUS | Status: AC
  Administered 2021-03-26: 1 ug/kg/h via INTRAVENOUS
  Filled 2021-03-26: qty 100

## 2021-03-26 MED ORDER — MAGNESIUM SULFATE 2 GM/50ML IV SOLN
2.0000 g | Freq: Once | INTRAVENOUS | Status: AC
  Administered 2021-03-26: 2 g via INTRAVENOUS
  Filled 2021-03-26: qty 50

## 2021-03-26 MED ORDER — LORAZEPAM 2 MG/ML IJ SOLN
2.0000 mg | Freq: Once | INTRAMUSCULAR | Status: AC
  Administered 2021-03-26: 2 mg via INTRAVENOUS

## 2021-03-26 MED ORDER — MORPHINE SULFATE (PF) 2 MG/ML IV SOLN
2.0000 mg | INTRAVENOUS | Status: DC | PRN
  Administered 2021-03-26: 2 mg via INTRAVENOUS
  Filled 2021-03-26 (×3): qty 1

## 2021-03-26 MED ORDER — HYDROMORPHONE HCL 1 MG/ML IJ SOLN
INTRAMUSCULAR | Status: AC
  Administered 2021-03-26: 2 mg via INTRAVENOUS
  Filled 2021-03-26: qty 2

## 2021-03-26 MED ORDER — MORPHINE SULFATE (PF) 2 MG/ML IV SOLN
1.0000 mg | INTRAVENOUS | Status: DC | PRN
  Administered 2021-03-26: 1 mg via INTRAVENOUS
  Filled 2021-03-26: qty 1

## 2021-03-26 MED ORDER — SODIUM CHLORIDE 0.9 % IV SOLN
INTRAVENOUS | Status: DC
  Filled 2021-03-26: qty 1000

## 2021-03-26 MED ORDER — HYDROMORPHONE HCL 1 MG/ML IJ SOLN
1.0000 mg | INTRAMUSCULAR | Status: DC | PRN
  Administered 2021-03-26 – 2021-03-27 (×5): 1 mg via INTRAVENOUS
  Filled 2021-03-26 (×6): qty 1

## 2021-03-26 MED ORDER — LEVALBUTEROL HCL 1.25 MG/0.5ML IN NEBU
1.2500 mg | INHALATION_SOLUTION | Freq: Four times a day (QID) | RESPIRATORY_TRACT | Status: DC | PRN
  Administered 2021-03-26: 1.25 mg via RESPIRATORY_TRACT
  Filled 2021-03-26: qty 0.5

## 2021-03-26 MED ORDER — HYDROMORPHONE HCL 1 MG/ML IJ SOLN: 1.0000 mg | INTRAMUSCULAR | Status: DC | PRN

## 2021-03-26 MED ORDER — DEXMEDETOMIDINE HCL IN NACL 400 MCG/100ML IV SOLN
0.4000 ug/kg/h | INTRAVENOUS | Status: DC
  Administered 2021-03-26: 0.9 ug/kg/h via INTRAVENOUS
  Administered 2021-03-27: 1.2 ug/kg/h via INTRAVENOUS
  Administered 2021-03-27: 0.5 ug/kg/h via INTRAVENOUS
  Administered 2021-03-27: 1.2 ug/kg/h via INTRAVENOUS
  Administered 2021-03-27: 0.8 ug/kg/h via INTRAVENOUS
  Administered 2021-03-27: 1.2 ug/kg/h via INTRAVENOUS
  Administered 2021-03-27: 0.5 ug/kg/h via INTRAVENOUS
  Administered 2021-03-28: 0.8 ug/kg/h via INTRAVENOUS
  Filled 2021-03-26 (×7): qty 100

## 2021-03-26 MED ORDER — MORPHINE SULFATE (PF) 2 MG/ML IV SOLN
INTRAVENOUS | Status: AC
  Administered 2021-03-26: 2 mg via INTRAVENOUS
  Filled 2021-03-26: qty 1

## 2021-03-26 MED ORDER — POTASSIUM PHOSPHATES 15 MMOLE/5ML IV SOLN
30.0000 mmol | Freq: Once | INTRAVENOUS | Status: AC
  Administered 2021-03-26: 30 mmol via INTRAVENOUS
  Filled 2021-03-26: qty 10

## 2021-03-26 NOTE — Progress Notes (Signed)
Patient had become more agitated this afternoon- constantly up and down from bed- refusing PICC placement- Precedex drip was ordered and Dilaudid- PRN- patient seems more restful at this time- patient's mother , husband and brother at the bedside. ?

## 2021-03-26 NOTE — Progress Notes (Signed)
At bedside to assess patient for PICC placement in the left arm. Patient keeps trying to get up out of the bed and can't lay down long enough for the PICC to be placed. ?4:05 Re-assessed patient for PICC placement. Family at bedside and would prefer to wait a little longer before trying to place the PICC. RN made aware that PICC would probably not be placed tonight. Will need 2 Nurses for PICC placement. ?

## 2021-03-26 NOTE — Consult Note (Signed)
?[facility]  ?[phone] [phone] ? ?ID: [name] OB: [birth year]  MR#: [id number]  UYQ#:[id number] ? ?Patient Care Team: ?Dion Body, MD as PCP - General (Family Medicine) ?Gery Pray, MD as Consulting Physician (Radiation Oncology) ?Jovita Kussmaul, MD as Consulting Physician (General Surgery) ?Dillingham, Loel Lofty, DO as Attending Physician (Plastic Surgery) ?Lloyd Huger, MD as Consulting Physician (Oncology) ? ?CHIEF COMPLAINT: Progressive stage IV breast cancer now with new brain mets, altered mental status. ? ?INTERVAL HISTORY: Patient is a 41 year old female with progressive stage IV breast cancer who was recently admitted to the ICU with altered mental status, hypercalcemia, and multiple new brain metastasis.  She is currently sedated and review of systems is unobtainable. ? ?REVIEW OF SYSTEMS:   ?Review of Systems  ?Unable to perform ROS: Critical illness  ? ?PAST MEDICAL HISTORY: ?Past Medical History:  ?Diagnosis Date  ? Chronic constipation   ? Chronic narcotic use   ? Chronic nausea   ? due to taking chemo drug  ? Family history of breast cancer   ? GAD (generalized anxiety disorder)   ? History of pregnancy induced hypertension   ? HSV (herpes simplex virus) anogenital infection   ? positive titer only  ? Lung metastasis ([place]) 07/2020  ? secondary to primiary breast cancer  ? Malignant neoplasm of upper-outer quadrant of right breast in female, estrogen receptor positive ([place])   ? oncologist--- dr Grayland Ormond;;  first dx 09/ 2020 s/p right lumpectomy w/ node dissection and completed chemoradiation 08/ 2021;;  recurrent 07/ 2022 to lung pleura,liver, and thoracic spine  ? SOB (shortness of breath)   ? 02-12-2021  pt denies with regular activity but sob with stairs  ? ? ?PAST SURGICAL HISTORY: ?Past Surgical History:  ?Procedure Laterality Date  ? ABDOMINAL HYSTERECTOMY N/A 02/17/2021  ? BREAST LUMPECTOMY WITH AXILLARY LYMPH NODE BIOPSY  Right 11/21/2018  ? Procedure: RIGHT BREAST LUMPECTOMY WITH SENTINEL LYMPH NODE BIOPSY;  Surgeon: Jovita Kussmaul, MD;  Location: [facility];  Service: General;  Laterality: Right;  ? BREAST REDUCTION SURGERY Bilateral 11/21/2018  ? Procedure: BILATERAL MAMMARY REDUCTION  (BREAST);  Surgeon: Wallace Going, DO;  Location: [facility];  Service: Plastics;  Laterality: Bilateral;  ? CHEST TUBE INSERTION Left 07/29/2020  ? Procedure: INSERTION PLEURAL DRAINAGE CATHETER;  Surgeon: Lajuana Matte, MD;  Location: [place];  Service: Thoracic;  Laterality: Left;  ? IRRIGATION AND DEBRIDEMENT STERNOCLAVICULAR JOINT-STERNUM AND RIBS N/A 08/13/2020  ? Procedure: IRRIGATION AND DEBRIDEMENT CHEST WALL ABSCESS;  Surgeon: Lajuana Matte, MD;  Location: [place];  Service: Cardiothoracic;  Laterality: N/A;  ? IUD REMOVAL N/A 02/17/2021  ? Procedure: INTRAUTERINE DEVICE (IUD) REMOVAL;  Surgeon: Linda Hedges, DO;  Location: [place];  Service: Gynecology;  Laterality: N/A;  ? LAPAROSCOPIC SALPINGO OOPHERECTOMY Bilateral 02/17/2021  ? Procedure: LAPAROSCOPIC BILATERAL SALPINGO OOPHORECTOMY;  Surgeon: Linda Hedges, DO;  Location: [facility];  Service: Gynecology;  Laterality: Bilateral;  ? LEFT HEART CATH AND CORONARY ANGIOGRAPHY N/A 02/25/2021  ? Procedure: LEFT HEART CATH AND CORONARY ANGIOGRAPHY possible PCI and stent;  Surgeon: Yolonda Kida, MD;  Location: [facility];  Service: Cardiovascular;  Laterality: N/A;  ? PLEURAL BIOPSY Left 07/29/2020  ? Procedure: PLEURAL BIOPSY;  Surgeon: Lajuana Matte, MD;  Location: [facility];  Service: Thoracic;  Laterality: Left;  ? PLEURAL EFFUSION DRAINAGE Left 07/29/2020  ? Procedure: DRAINAGE OF PLEURAL EFFUSION;  Surgeon: Lajuana Matte, MD;  Location:  [facility];  Service: Thoracic;  Laterality: Left;  ? PORT-A-CATH REMOVAL N/A 07/06/2019  ? Procedure: REMOVAL PORT-A-CATH;  Surgeon: Jovita Kussmaul, MD;  Location: [facility];  Service: General;  Laterality: N/A;  ? PORTACATH PLACEMENT N/A 11/21/2018  ? Procedure: INSERTION LEFT PORT-A-CATH WITH ULTRASOUND GUIDANCE;  Surgeon: Jovita Kussmaul, MD;  Location: [facility];  Service: General;  Laterality: N/A;  ? TONSILLECTOMY  1987  ? VIDEO ASSISTED THORACOSCOPY Left 07/29/2020  ? Procedure: VIDEO ASSISTED THORACOSCOPY;  Surgeon: Lajuana Matte, MD;  Location: [facility];  Service: Thoracic;  Laterality: Left;  ? WISDOM TOOTH EXTRACTION    ? ? ?FAMILY HISTORY: ?Family History  ?Problem Relation Age of Onset  ? Hypertension Father   ? Alcohol abuse Paternal Grandfather   ? Heart disease Paternal Grandfather   ? Alcohol abuse Maternal Grandfather   ? Heart disease Maternal Grandmother   ? Breast cancer Other   ? ? ?ADVANCED DIRECTIVES (Y/N):  '@ADVDIR'$ @ ? ?HEALTH MAINTENANCE: ?Social History  ? ?Tobacco Use  ? Smoking status: Former  ?  Packs/day: 0.50  ?  Years: 20.00  ?  Pack years: 10.00  ?  Types: Cigarettes  ?  Start date: 01/13/2002  ?  Quit date: 10/17/2016  ?  Years since quitting: 4.4  ? Smokeless tobacco: Never  ?Vaping Use  ? Vaping Use: Never used  ?Substance Use Topics  ? Alcohol use: Yes  ?  Comment: Socially  ? Drug use: Never  ? ? ? Colonoscopy: ? PAP: ? Bone density: ? Lipid panel: ? ?Allergies  ?Allergen Reactions  ? Betadine [Povidone-Iodine] Rash  ? ? ?Current Facility-Administered Medications  ?Medication Dose Route Frequency Provider Last Rate Last Admin  ? Chlorhexidine Gluconate Cloth 2 % PADS 6 each  6 each Topical Q0600 Para Skeans, MD   6 each at 03/26/21 [phone]  ? dexamethasone (DECADRON) injection 4 mg  4 mg Intravenous Q6H Para Skeans, MD   4 mg at 03/26/21 1738  ? dexmedetomidine (PRECEDEX) 400 MCG/100ML (4 mcg/mL) infusion  0.4-1.2 mcg/kg/hr Intravenous Titrated Lorella Nimrod, MD 18.65 mL/hr at 03/26/21 1412 1 mcg/kg/hr at 03/26/21 1412  ? heparin injection 5,000 Units  5,000 Units Subcutaneous Q8H Para Skeans, MD   5,000 Units at 03/26/21 1348  ?  HYDROmorphone (DILAUDID) injection 1 mg  1 mg Intravenous Q4H PRN Lorella Nimrod, MD      ? levalbuterol Penne Lash) nebulizer solution 1.25 mg  1.25 mg Nebulization Q6H PRN Lorella Nimrod, MD   1.25 mg at 03/26/21 1738  ? levETIRAcetam (KEPPRA) IVPB 500 mg/100 mL premix  500 mg Intravenous Q12H Florina Ou V, MD 400 mL/hr at 03/26/21 0932 500 mg at 03/26/21 0932  ? LORazepam (ATIVAN) injection 1 mg  1 mg Intravenous Q6H PRN Lorella Nimrod, MD   1 mg at 03/26/21 1209  ? metoprolol tartrate (LOPRESSOR) tablet 25 mg  25 mg Oral BID Para Skeans, MD   25 mg at 03/26/21 0258  ? morphine (PF) 2 MG/ML injection 2 mg  2 mg Intravenous Q4H PRN Lorella Nimrod, MD   2 mg at 03/26/21 1113  ? sodium chloride flush (NS) 0.9 % injection 3 mL  3 mL Intravenous Q12H Florina Ou V, MD   3 mL at 03/26/21 0926  ? venlafaxine XR (EFFEXOR-XR) 24 hr capsule 150 mg  150 mg Oral Q breakfast Para Skeans, MD   150 mg at 03/26/21 5277  ? ?Facility-Administered Medications Ordered in Other  Encounters  ?Medication Dose Route Frequency Provider Last Rate Last Admin  ? 0.9 %  sodium chloride infusion   Intravenous Continuous Tsosie Billing D, PA-C      ? ? ?OBJECTIVE: ?Vitals:  ? 03/26/21 1600 03/26/21 1700  ?BP: 123/83 123/86  ?Pulse: (!) 107 (!) 112  ?Resp: (!) 28 (!) 32  ?Temp:  (!) 97.4 ?F (36.3 ?C)  ?SpO2: 96% 95%  ?   Body mass index is 28.23 kg/m?Marland Kitchen    ECOG FS:4 - Bedbound ? ?General: Ill-appearing, not currently agitated. ?Eyes: Pink conjunctiva, anicteric sclera. ?HEENT: Normocephalic, moist mucous membranes. ?Lungs: No audible wheezing or coughing. ?Heart: Regular rate and rhythm. ?Abdomen: Soft, nontender, no obvious distention. ?Musculoskeletal: No edema, cyanosis, or clubbing. ?Neuro: Lethargic. Cranial nerves grossly intact. ?Skin: No rashes or petechiae noted. ? ?LAB RESULTS: ? ?Lab Results  ?Component Value Date  ? NA 139 03/26/2021  ? K 3.4 (L) 03/26/2021  ? CL 102 03/26/2021  ? CO2 25 03/26/2021  ? GLUCOSE 100 (H) 03/26/2021  ? BUN  20 03/26/2021  ? CREATININE 0.78 03/26/2021  ? CALCIUM 11.3 (H) 03/26/2021  ? PROT 6.3 (L) 03/29/2021  ? ALBUMIN 2.8 (L) 03/06/2021  ? AST 203 (H) 03/24/2021  ? ALT 64 (H) 03/10/2021  ? ALKPHOS 114 03/29/2021  ? BILITOT 1.

## 2021-03-26 NOTE — Telephone Encounter (Addendum)
Dr. Grayland Ormond and [facility] aware of pt hospital admission. MD will go see pt in hospital after clinic ?

## 2021-03-26 NOTE — Progress Notes (Signed)
?   03/26/21 1500  ?Clinical Encounter Type  ?Visited With Family  ?Visit Type Initial  ?Spiritual Encounters  ?Spiritual Needs Grief support  ? ?Chaplain visited with husband and brother of patient who all recently received some unfortunate news. ?

## 2021-03-26 NOTE — Progress Notes (Signed)
Radiation Oncology ?Follow up Note ? ?Name: Kaylee Romero   ?Date:   03/29/2021 ?MRN:  [id number] ?DOB: 09-Feb-1980  ? ? ?This 41 y.o. female seen in the ICU for brain metastasis ?REFERRING PROVIDER: No ref. provider found ? ?HPI: Patient is a 41 year old female well-known to department having received palliative radiation therapy to her spine as well as we are planning to initiate palliative radiation therapy to her left chest wall for stage IV metastatic breast cancer..  She was admitted to the ICU for sepsis as well as MRI scan showing multiple contrast-enhancing lesions within the [place] supratentorial and infratentorial brain consistent with widespread metastatic disease.  She also has diffuse dural thickening concerning for dural metastatic disease.  She also has multiple ill-defined calvarial enhancement consistent with osseous metastatic disease.  She is seen today in the ICU.  She is coherent accompanied by family member.  She has been started on steroids.  She is having no significant headaches at this time. ? ?COMPLICATIONS OF TREATMENT: none ? ?FOLLOW UP COMPLIANCE: keeps appointments  ? ?PHYSICAL EXAM:  ?BP (!) 159/92   Pulse 95   Temp 98.3 ?F (36.8 ?C) (Oral)   Resp (!) 35   Ht '5\' 4"'$  (1.626 m)   Wt 164 lb 7.4 oz (74.6 kg)   LMP 01/06/2019 (Approximate)   SpO2 97%   BMI 28.23 kg/m?  ?Patient's speech is slow.  Crude visual fields are within normal range.  No focal neurologic deficits are appreciated.  Well-developed well-nourished patient in NAD. HEENT reveals PERLA, EOMI, discs not visualized.  Oral cavity is clear. No oral mucosal lesions are identified. Neck is clear without evidence of cervical or supraclavicular adenopathy. Lungs are clear to A&P. Cardiac examination is essentially unremarkable with regular rate and rhythm without murmur rub or thrill. Abdomen is benign with no organomegaly or masses noted. Motor sensory and DTR levels are equal and symmetric in the upper and lower extremities.  Cranial nerves II through XII are grossly intact. Proprioception is intact. No peripheral adenopathy or edema is identified. No motor or sensory levels are noted. Crude visual fields are within normal range. ? ?RADIOLOGY RESULTS: MRI scan reviewed compatible with above-stated findings ? ?PLAN: At this time elect to go ahead with whole brain radiation therapy.  We will plan on delivering 30 Gray in 10 fractions.  I discussed with her family member her guarded diagnosis.  I have explained that palliative radiation therapy to the brain can prevent further neurologic decline as well as possible site issues such as focal neurologic deficits incontinence and headaches over time.  I have personally set up and ordered CT simulation for tomorrow.  We will coordinate her both her chest palliative radiation therapy as well as her whole brain treatments to start simultaneously on Monday. ? ?I would like to take this opportunity to thank you for allowing me to participate in the care of your patient.. ?  ? Noreene Filbert, MD ? ?

## 2021-03-26 NOTE — Progress Notes (Unsigned)
Patient's mother and brother presented to clinic and asked to meet with me.  Note that patient's mother is on HIPAA documentation.  Patient's brother is the one that generally accompanies her to clinic visits. ? ?Family report that patient has taken over 60 hydromorphone tablets in 4 to 5 days.  Family felt that patient was overmedicating and so have taken a more active role in dispensing her medications.  Additionally, they have hired an Charity fundraiser to help with her care.  Family describe an instance where patient stated she was not in pain and then a few minutes later seemed panicked asking for hydromorphone.  The brother gave her a hydromorphone and then patient immediately told that the pain was better.  Family stated that they recognize that oral hydromorphone does not work that quickly. ? ?We discussed patient's pain regimen in detail.  Family tell me that patient has not required hydromorphone since yesterday and is currently stating that her pain is well controlled on long-acting MS Contin alone.  Ideally, I would like patient on the lowest dose/frequency of pain medications necessary to control her pain and keep her comfortable.  We discussed dose reduction of her hydromorphone to 1/2 tablet ('2mg'$ ) as needed for breakthrough pain.  We also discussed possible future dose reduction of MS Contin as tolerated.  Hopefully, pain will improve as patient begins RT and through interventional procedures by Dr. Dossie Arbour and we can further taper pain medications.  ? ?I explored goals of care with family.  They state an understanding that patient has stage IV cancer but said they were told in consultation by [facility] that patient had a good prognosis and might live 30 to 40 years with cancer treatment.  I gently and empathetically explored her poor prognosis in the context of what appears to be a rapidly progressing and aggressive breast cancer.  We discussed that treatment was with palliative intent and that  her cancer is considered incurable.  All questions answered to their verbalized satisfaction. ? ?Case and plan discussed at length with Dr. Grayland Ormond.  I have also consulted Teresita, LCSW for supportive and therapeutic services to patient and family. ? ? ?

## 2021-03-26 NOTE — Consult Note (Signed)
PHARMACY CONSULT NOTE ? ?Pharmacy Consult for Electrolyte Monitoring and Replacement  ? ?Recent Labs: ?Potassium (mmol/L)  ?Date Value  ?03/26/2021 3.4 (L)  ? ?Magnesium (mg/dL)  ?Date Value  ?03/26/2021 1.5 (L)  ? ?Calcium (mg/dL)  ?Date Value  ?03/26/2021 11.3 (H)  ? ?Calcium, Total (PTH) (mg/dL)  ?Date Value  ?03/10/2021 10.7 (H)  ? ?Albumin (g/dL)  ?Date Value  ?03/14/2021 2.8 (L)  ? ?Phosphorus (mg/dL)  ?Date Value  ?03/26/2021 2.0 (L)  ? ?Sodium (mmol/L)  ?Date Value  ?03/26/2021 139  ? 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 Found to have severe hypercalcemia. Pharmacy has been consulted to monitor and replace electrolytes as needed. ? ?Medications: ?Zometa 3/5, 3/21 ?Calcitonin 3/21 ?NS at 150 cc/hr ? ?Goal of Therapy:  ?Electrolytes within normal limits  ? ?Plan:  ?--Ca 14.5 >> 11.3 (not corrected). Improving with implemented therapies ?--Follow-up electrolytes with AM labs tomorrow ? ?Benita Gutter  ?03/26/2021 ?2:32 PM ? ?

## 2021-03-26 NOTE — Consult Note (Signed)
PHARMACY CONSULT NOTE - FOLLOW UP ? ?Pharmacy Consult for Electrolyte Monitoring and Replacement  ? ?Recent Labs: ?Potassium (mmol/L)  ?Date Value  ?03/23/2021 3.1 (L)  ? ?Magnesium (mg/dL)  ?Date Value  ?03/26/2021 1.5 (L)  ? ?Calcium (mg/dL)  ?Date Value  ?03/26/2021 12.1 (H)  ? ?Calcium, Total (PTH) (mg/dL)  ?Date Value  ?03/10/2021 10.7 (H)  ? ?Albumin (g/dL)  ?Date Value  ?03/05/2021 2.8 (L)  ? ?Phosphorus (mg/dL)  ?Date Value  ?03/26/2021 2.0 (L)  ? ?Sodium (mmol/L)  ?Date Value  ?04/02/2021 140  ? ?Corrected Ca: 15.1 >> 13.1, trending down ?Phos: 2.0 ?Mag: 1.5 ? 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 Found to have severe hypercalcemia. Pharmacy has been consulted to monitor and replace electrolytes as needed. ? ?Goal of Therapy:  ?Ca: WNL ?All electrolytes WNL  ? ?Plan:  ?Severe hypercalcemia:  ?Calcitonin 4 units/kg x 1 (faster onset) and Zometa 4 mg x 1  (longer onset) ordered by provider ?Ca levels ordered Q6H. If no adequate response within 6-12 hours, consider increasing dose to 8 units/kg Q6-12H for max duration of 48 hours ?D/c calcitonin when CorrCa < 14 w/o AMS, or CorrCa < 12, or 4 doses ?Do not anticipate effects of Zometa to be seen for 24-72 hours ?IVMF changed from LR to NS as LR contains 3 mEq/L of Calcium ?Hypokalemia ?Will repleat with 10 mEq IV Kcl x 2 doses ? ?Hypomagnesia  ?Order Mag Sulfate 2 gm x 1 dose ?Hypophosphatemia ?Order K Phos 30 mmol x 1 dose ?BMP ordered with AM labs, will recheck K, Albumin, & Ca lvls. ? ?Renda Rolls, PharmD, MBA ?03/26/2021 ?3:28 AM ? ?

## 2021-03-26 NOTE — TOC Initial Note (Signed)
Transition of Care (TOC) - Initial/Assessment Note  ? ? ?Patient Details  ?Name: Kaylee Romero ?MRN: [id number] ?Date of Birth: [birth year] ? ?Transition of Care (TOC) CM/SW Contact:    ?Kaylee Hutching, RN ?Phone Number: ?03/26/2021, 3:32 PM ? ?Clinical Narrative:                 ?Patient admitted to the hospital with altered mental status, history of stage 4 breast cancer with mets.  Patient has mets to brain.  She will start whole brain radiation treatments along with her palliative chest radiation, radiation start possibly next week.   ? ?RNCM met with patient and her brother at the bedside this morning, introduced self and explained role.  Patient is from home where she lives with her husband and her 55 year old child.  She has an Engineer, production with [facility] every day for 8 hours per day.  The aide helps with cooking, cleaning, transportation, and medications. ? ?Patient's family will be able to pick her up at discharge.   ?No TOC needs identified at this time. ? ?Expected Discharge Plan: Home/Self Care ?Barriers to Discharge: Continued Medical Work up ? ? ?Patient Goals and CMS Choice ?Patient states their goals for this hospitalization and ongoing recovery are:: patient unable to state goals at this time ?  ?  ? ?Expected Discharge Plan and Services ?Expected Discharge Plan: Home/Self Care ?  ?Discharge Planning Services: CM Consult ?  ?Living arrangements for the past 2 months: [place] ?                ?DME Arranged: N/A ?DME Agency: NA ?  ?  ?  ?HH Arranged: NA ?  ?  ?  ?  ? ?Prior Living Arrangements/Services ?Living arrangements for the past 2 months: [place] ?Lives with:: Spouse, Minor Children ?Patient language and need for interpreter reviewed:: Yes ?Do you feel safe going back to the place where you live?: Yes      ?Need for Family Participation in Patient Care: Yes (Comment) ?Care giver support system in place?: Yes (comment) (husband, brother) ?  ?Criminal Activity/Legal  Involvement Pertinent to Current Situation/Hospitalization: No - Comment as needed ? ?Activities of Daily Living ?Home Assistive Devices/Equipment: None ?ADL Screening (condition at time of admission) ?Patient's cognitive ability adequate to safely complete daily activities?: Yes ?Is the patient deaf or have difficulty hearing?: No ?Does the patient have difficulty seeing, even when wearing glasses/contacts?: No ?Does the patient have difficulty concentrating, remembering, or making decisions?: No ?Patient able to express need for assistance with ADLs?: Yes ?Does the patient have difficulty dressing or bathing?: No ?Independently performs ADLs?: Yes (appropriate for developmental age) ?Does the patient have difficulty walking or climbing stairs?: No ?Weakness of Legs: None ?Weakness of Arms/Hands: None ? ?Permission Sought/Granted ?Permission sought to share information with : Case Manager, Family Supports ?Permission granted to share information with : Yes, Verbal Permission Granted ? Share Information with NAME: Kaylee Romero ?   ? Permission granted to share info w Relationship: spouse ? Permission granted to share info w Contact Information: [phone] ? ?Emotional Assessment ?Appearance:: Appears stated age ?Attitude/Demeanor/Rapport: Inconsistent, Self-Absorbed ?Affect (typically observed): Pleasant, Restless ?Orientation: : Oriented to Self, Oriented to Place ?Alcohol / Substance Use: Not Applicable ?Psych Involvement: No (comment) ? ?Admission diagnosis:  Hypercalcemia [E83.52] ?Brain metastasis ([place]) [C79.31] ?Metastatic breast cancer ([place]) [C50.919] ?Community acquired pneumonia, unspecified laterality [J18.9] ?AMS (altered mental status) [R41.82] ?Sepsis, due to unspecified organism, unspecified whether  acute organ dysfunction present ([place]) [A41.9] ?Patient Active Problem List  ? Diagnosis Date Noted  ? AMS (altered mental status) 03/08/2021  ? Breast cancer metastasized to brain [facility]) 03/06/2021  ?  Hypokalemia 03/05/2021  ? 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 Thoracic radiculopathy (T5-T9) (Left) 03/18/2021  ? 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 Chronic thoracic back pain (Left) 03/18/2021  ? 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 Drug tolerance, sequela 03/17/2021  ? Allergy to iodine 03/17/2021  ? Allergy to povidone-iodine topical antiseptic 03/17/2021  ? Chronic pain syndrome 03/17/2021  ? Pharmacologic therapy 03/17/2021  ? Disorder of skeletal system 03/17/2021  ? Problems influencing health status 03/17/2021  ? Chronic use of opiate for therapeutic purpose 03/17/2021  ? Palliative care encounter   ? Hypercalcemia 03/09/2021  ? Depression 03/09/2021  ? NSTEMI (non-ST elevated myocardial infarction) ([place]) 02/25/2021  ? Chest pain 02/24/2021  ? HCAP (healthcare-associated pneumonia) 02/23/2021  ? S/P bilateral salpingo-oophorectomy 02/17/21 02/23/2021  ? Cancer-related pain 02/23/2021  ? Acute dyspnea 02/23/2021  ? External hemorrhoids 11/11/2020  ? Recurrent breast cancer, right ([place]) 11/11/2020  ? Visit for wound check 09/10/2020  ? Chest wall abscess 08/13/2020  ? Sepsis ([place]) 08/09/2020  ? Pleural effusion on left 07/29/2020  ? Pulmonary nodule seen on imaging study 07/01/2020  ? GAD (generalized anxiety disorder) 04/01/2020  ? Essential hypertension 03/06/2020  ? History of breast cancer 03/06/2020  ? Hx of breast surgery 02/28/2019  ? Port-A-Cath in place 02/02/2019  ? Genetic testing 10/13/2018  ? Malignant neoplasm of upper-outer quadrant of right breast in female, estrogen receptor positive ([place]) 09/29/2018  ? Pregnancy 06/18/2017  ? Low back pain 01/14/2013  ? ADD (attention deficit disorder) 03/24/2012  ? Overweight (BMI 25.0-29.9) 01/14/2012  ? ?PCP:  Dion Body, MD ?Pharmacy:   ?[id number], [address] ?[place]. ?[place] [address] ?Phone: [phone] Fax: [phone] ? ?[id number] [address] ?[address] ?[place] [address] ?Phone: [phone] Fax: [phone] ? ?[id number]?Phone: [phone]: [phone]? ?[facility] [address]?Phone: [phone]: [phone]? ?[place] ?[place]?Phone: [phone]: [phone]? ? ? ? ?Social Determinants of Health (SDOH) Interventions ?  ? ?Readmission Risk Interventions ?   ? View : No data to display.  ?  ?  ?  ? ? ? ?

## 2021-03-26 NOTE — Hospital Course (Addendum)
Taken from an prior notes. ? ?Kaylee Romero is a 41 y.o. female with medical history significant of widely metastasized stage IV breast cancer and  GAD was admitted last night with concern of altered mental status and lethargy for the past 3 days. ? ?She was found to have hypercalcemia and new diagnosis of multiple brain mets on MRI brain, which shows supratentorial and infratentorial brain consistent with widespread metastatic disease.  She also has diffuse dural thickening concerning for dural metastatic disease.  She also has multiple ill-defined calvarial enhancement consistent with osseous metastatic disease. ? ?Patient was given calcitonin and zoledronic acid along with aggressive hydration, calcium not trending down. ? ?She was also started on IV steroid and radiation oncology was consulted and they are planning to start full brain radiation on Monday.  Patient was also scheduled to get chest wall radiation for bone mets.  She has previously received palliative radiation to spine. ? ?Patient was scheduled to have her liver biopsy and port placement today with IR, had a discussion with her oncologist Dr. Marc Morgans on an IR and decided to proceed with PICC line at this time and biopsy and port can be postponed till discharge.  PICC line ordered. ? ?Patient is having worsening anxiety and pain.  Giving morphine and Ativan. ?Family also requested a meeting with her oncologist and palliative care from cancer center, most likely this afternoon after Dr. Grayland Ormond finishes his clinic. ?Also started her on Precedex infusion and Dilaudid was added as we were unable to control her anxiety and pain. ? ?There was also concern of sepsis with leukocytosis, sepsis ruled out.  No obvious source of infection at this time.  Procalcitonin at 0.16 but she is having active malignancy.  Cefepime and vancomycin was discontinued and we will observe her without any antibiotics at this time. ?She has worsening leukocytosis most likely  secondary to her disease burden and now with IV steroid. ? ?3/23: Patient was sedated when seen during morning rounds.  She was very agitated overnight. ?Radiation oncology deferred simulation as she has to be more calmer.  Had a long discussion back-and-forth with family, initially they want aggressive measures stating that we are already losing the battle and she wants to fight till the very end.  Discussed with PCCM to see if it is a possibility for intubation to get liver biopsy, patient was not really responding to recent chemo with very aggressive disease progression, there was a concern of evolution of her prior breast cancer. ?Patient has very extensive pulmonary metastasis with extensive involvement of spine and ribs and other bony lesions.  And now new multiple brain mets involving infra and supratentorial regions along with bony skull. ?After discussion with PCCM and family, they are leaning more toward comfort care. ? ?Patient later transitioned to full comfort care.  She passed comfortably, surrounded by multiple family members at 11:42 AM on 2021/03/30. ? ?

## 2021-03-26 NOTE — Telephone Encounter (Signed)
Received message after hours from Evern Bio requesting a meeting with provider and patient husband and parents. I see that she has been admitted to hospital with sepsis yesterday. ?Of note this Cristie Hem is NOT on her Release of information form ?

## 2021-03-26 NOTE — Progress Notes (Signed)
?Progress Note ? ? ?Patient: Kaylee Romero NUU:[id number] DOB: Oct 29, 1980 DOA: 03/17/2021     1 ?DOS: the patient was seen and examined on 03/26/2021 ?  ?Brief hospital course: ?Taken from an prior notes. ? ?Kaylee Romero is a 41 y.o. female with medical history significant of widely metastasized stage IV breast cancer and  GAD was admitted last night with concern of altered mental status and lethargy for the past 3 days. ? ?She was found to have hypercalcemia and new diagnosis of multiple brain mets on MRI brain, which shows supratentorial and infratentorial brain consistent with widespread metastatic disease.  She also has diffuse dural thickening concerning for dural metastatic disease.  She also has multiple ill-defined calvarial enhancement consistent with osseous metastatic disease. ? ?Patient was given calcitonin and zoledronic acid along with aggressive hydration, calcium not trending down. ? ?She was also started on IV steroid and radiation oncology was consulted and they are planning to start full brain radiation on Monday.  Patient was also scheduled to get chest wall radiation for bone mets.  She has previously received palliative radiation to spine. ? ?Patient was scheduled to have her liver biopsy and port placement today with IR, had a discussion with her oncologist Dr. Marc Morgans on an IR and decided to proceed with PICC line at this time and biopsy and port can be postponed till discharge.  PICC line ordered. ? ?Patient is having worsening anxiety and pain.  Giving morphine and Ativan. ?Family also requested a meeting with her oncologist and palliative care from cancer center, most likely this afternoon after Dr. Grayland Ormond finishes his clinic. ?Also started her on Precedex infusion and Dilaudid was added as we were unable to control her anxiety and pain. ? ?There was also concern of sepsis with leukocytosis, sepsis ruled out.  No obvious source of infection at this time.  Procalcitonin at 0.16 but she  is having active malignancy.  Cefepime and vancomycin was discontinued and we will observe her without any antibiotics at this time. ?She has worsening leukocytosis most likely secondary to her disease burden and now with IV steroid. ? ? ? ?Assessment and Plan: ?* AMS (altered mental status) ?Pt coming to [place] with lethargy and somnolent state attribute to combination of hypercalcemia and brain mets. ?Hypercalcemia improving but she remained very anxious.  She was also placed on Decadron and Keppra per neurosurgery, Decadron might be contributing to her anxiety along with uncontrolled pain. ?Talk with neurosurgery and they would like to continue Keppra and deferring the decision about Decadron to radiation oncology-message was sent. ?Started her on Precedex infusion for uncontrolled anxiety as she was not responding to Ativan. ?-Continue to monitor ?-Patient is very high risk for deterioration and death. ? ?Hypercalcemia ?Hypercalcemia of malignancy.  ?Calcium appropriately trending down. ?Received calcitonin and ZA. ?Monitor on tele in stepdown as pt is high risk for arrhythmias.  ?Monitor telemetry and intervals.  ? ? ?Sepsis ([place]) ?Sepsis ruled out.  Leukocytosis most likely secondary to disease burden and now worsening with steroid.  Barely positive procalcitonin and no source of infection.  Preliminary cultures negative. ?-Stop antibiotics and observe ? ? ?Breast cancer metastasized to brain [facility]) ?Patient with widespread metastatic disease, history of ER positive stage IV breast cancer not responding to current management as disease continue to spread.  Now with multiple brain and skull mets.  Dr. Grayland Ormond is aware ?Plan is to start palliative whole brain radiation on Monday. ?Unable to place port and do liver biopsy as  she is unstable. ?PICC line ordered-unable to do at this time due to extreme anxiety. ?Family is waiting for her oncologist visit to make further decisions. ?Very difficult situation and  extremely high risk for deterioration and death. ?-Continue with Precedex infusion ?-Continue with morphine and Dilaudid. ?-Continue with Keppra and Decadron for now ?-Monitor for any respiratory compromise, patient is still full code. ?  ? ?Essential hypertension ?Blood pressure variable, currently within goal. ?-Continue with metoprolol ?-Continue to monitor ? ? ?GAD (generalized anxiety disorder) ?Patient with worsening anxiety and very restless.  Pain is also contributory.  She was also placed on Decadron which can cause increase in anxiety. ?Message sent to radiation oncology if she has to continue with steroid. ?She was started on Precedex infusion as as needed Ativan was not working. ?-Continue with Precedex infusion for now ?-Continue venlafaxine.  ? ?Hypokalemia ?Magnesium levels at 1.5 ?Replace potassium and magnesium. ?Continue to monitor ? ? ? ?  ? ?Subjective: Patient was little restless and complaining about pain in her extremities when seen during morning rounds.  Later called by nursing staff for reevaluation as she was becoming more restless.  She was very restless, thrashing around the bed even with maximum dose of Precedex infusion.  Stating yes when asked about pain.  Also appear to have air hunger. ? ?Physical Exam: ?Vitals:  ? 03/26/21 1300 03/26/21 1400 03/26/21 1500 03/26/21 1600  ?BP: (!) 146/93 (!) 146/95  123/83  ?Pulse: 100  99 (!) 107  ?Resp: (!) 36 (!) 27  (!) 28  ?Temp:      ?TempSrc:      ?SpO2: 99%  94% 96%  ?Weight:      ?Height:      ? ?General.  Patient appears very restless and anxious, appears to be in pain and having air hunger. ?Pulmonary.  Lungs clear bilaterally, labored breathing. ?CV.  Regular rate and rhythm, no JVD, rub or murmur. ?Abdomen.  Soft, nontender, nondistended, BS positive. ?CNS.  Awake and restless, no apparent focal deficit ?Extremities.  No edema, no cyanosis, pulses intact and symmetrical. ?Psychiatry.  Judgment and insight appears impaired ? ?Data  Reviewed: ?I personally reviewed prior notes, labs and images. ? ?Family Communication: Discussed with husband, brother and mother at bedside. ? ?Disposition: ?Status is: Inpatient ?Remains inpatient appropriate because: Severity of illness ? ? Planned Discharge Destination: To be determined ? ?DVT prophylaxis.  Subcu heparin ? ?Time spent: 60 minutes ? ?This record has been created using Systems analyst. Errors have been sought and corrected,but may not always be located. Such creation errors do not reflect on the standard of care. ? ?Author: ?Lorella Nimrod, MD ?03/26/2021 4:22 PM ? ?For on call review [email or web address].  ?

## 2021-03-26 NOTE — Consult Note (Signed)
? ?Referring Physician:  ?No referring provider defined for this encounter. ? ?Primary Physician:  ?Dion Body, MD ? ?Chief Complaint:  altered mental status ? ?History of Present Illness: ?03/26/2021 ?Kaylee Romero is a 41 y.o. female who presents with the chief complaint of lethargy, altered cognition, and poor PO intake.  She was admitted for possible sepsis.  Neurosurgery was consulted after imaging showed CNS involvement of her known cancer.   ? ?She is unable to provide history due to confusion. ? ? ? ?Review of Systems:  ?A 10 point review of systems is negative, except for the pertinent positives and negatives detailed in the HPI. ? ?Past Medical History: ?Past Medical History:  ?Diagnosis Date  ? Chronic constipation   ? Chronic narcotic use   ? Chronic nausea   ? due to taking chemo drug  ? Family history of breast cancer   ? GAD (generalized anxiety disorder)   ? History of pregnancy induced hypertension   ? HSV (herpes simplex virus) anogenital infection   ? positive titer only  ? 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 Malignant neoplasm of upper-outer quadrant of right breast in female, estrogen receptor positive ([place])   ? oncologist--- dr Grayland Ormond;;  first dx 09/ 2020 s/p right lumpectomy w/ node dissection and completed chemoradiation 08/ 2021;;  recurrent 07/ 2022 to lung pleura,liver, and thoracic spine  ? SOB (shortness of breath)   ? 02-12-2021  pt denies with regular activity but sob with stairs  ? ? ?Past Surgical History: ?Past Surgical History:  ?Procedure Laterality Date  ? ABDOMINAL HYSTERECTOMY N/A 02/17/2021  ? BREAST LUMPECTOMY WITH AXILLARY LYMPH NODE BIOPSY Right 11/21/2018  ? Procedure: RIGHT BREAST LUMPECTOMY WITH SENTINEL LYMPH NODE BIOPSY;  Surgeon: Jovita Kussmaul, MD;  Location: [place];  Service: General;  Laterality: Right;  ? BREAST REDUCTION SURGERY Bilateral 11/21/2018  ? Procedure: BILATERAL MAMMARY REDUCTION  (BREAST);  Surgeon:  Wallace Going, DO;  Location: [place];  Service: Plastics;  Laterality: Bilateral;  ? CHEST TUBE INSERTION Left 07/29/2020  ? Procedure: INSERTION PLEURAL DRAINAGE CATHETER;  Surgeon: Lajuana Matte, MD;  Location: [place];  Service: Thoracic;  Laterality: Left;  ? IRRIGATION AND DEBRIDEMENT STERNOCLAVICULAR JOINT-STERNUM AND RIBS N/A 08/13/2020  ? Procedure: IRRIGATION AND DEBRIDEMENT CHEST WALL ABSCESS;  Surgeon: Lajuana Matte, MD;  Location: [place];  Service: Cardiothoracic;  Laterality: N/A;  ? IUD REMOVAL N/A 02/17/2021  ? Procedure: INTRAUTERINE DEVICE (IUD) REMOVAL;  Surgeon: Linda Hedges, DO;  Location: [place];  Service: Gynecology;  Laterality: N/A;  ? LAPAROSCOPIC SALPINGO OOPHERECTOMY Bilateral 02/17/2021  ? Procedure: LAPAROSCOPIC BILATERAL SALPINGO OOPHORECTOMY;  Surgeon: Linda Hedges, DO;  Location: [place];  Service: Gynecology;  Laterality: Bilateral;  ? LEFT HEART CATH AND CORONARY ANGIOGRAPHY N/A 02/25/2021  ? Procedure: LEFT HEART CATH AND CORONARY ANGIOGRAPHY possible PCI and stent;  Surgeon: Yolonda Kida, MD;  Location: [facility];  Service: Cardiovascular;  Laterality: N/A;  ? PLEURAL BIOPSY Left 07/29/2020  ? Procedure: PLEURAL BIOPSY;  Surgeon: Lajuana Matte, MD;  Location: [place];  Service: Thoracic;  Laterality: Left;  ? PLEURAL EFFUSION DRAINAGE Left 07/29/2020  ? Procedure: DRAINAGE OF PLEURAL EFFUSION;  Surgeon: Lajuana Matte, MD;  Location: [place];  Service: Thoracic;  Laterality: Left;  ? PORT-A-CATH REMOVAL N/A 07/06/2019  ? Procedure: REMOVAL PORT-A-CATH;  Surgeon: Jovita Kussmaul, MD;  Location: [place];  Service: General;  Laterality: N/A;  ? PORTACATH PLACEMENT N/A 11/21/2018  ? Procedure: INSERTION LEFT PORT-A-CATH WITH ULTRASOUND GUIDANCE;  Surgeon: Jovita Kussmaul, MD;  Location: [place];  Service: General;  Laterality: N/A;  ? TONSILLECTOMY  1987  ? VIDEO ASSISTED THORACOSCOPY Left  07/29/2020  ? Procedure: VIDEO ASSISTED THORACOSCOPY;  Surgeon: Lajuana Matte, MD;  Location: [place];  Service: Thoracic;  Laterality: Left;  ? WISDOM TOOTH EXTRACTION    ? ? ?Allergies: ?Allergies as of 03/14/2021 - Review Complete 03/19/2021  ?Allergen Reaction Noted  ? Betadine [povidone-iodine] Rash 07/29/2020  ? ? ?Medications: ? ?Current Facility-Administered Medications:  ?  0.9 %  sodium chloride infusion, , Intravenous, Continuous, Dorothe Pea, RPH, Last Rate: 150 mL/hr at 03/26/21 1119, New Bag at 03/26/21 1119 ?  Chlorhexidine Gluconate Cloth 2 % PADS 6 each, 6 each, Topical, Q0600, Para Skeans, MD, 6 each at 03/26/21 [phone] ?  dexamethasone (DECADRON) injection 4 mg, 4 mg, Intravenous, Q6H, Para Skeans, MD, 4 mg at 03/26/21 1116 ?  dexmedetomidine (PRECEDEX) 400 MCG/100ML (4 mcg/mL) infusion, 0.4-1.2 mcg/kg/hr, Intravenous, Titrated, Amin, Soundra Pilon, MD, Last Rate: 18.65 mL/hr at 03/26/21 1412, 1 mcg/kg/hr at 03/26/21 1412 ?  heparin injection 5,000 Units, 5,000 Units, Subcutaneous, Q8H, Para Skeans, MD, 5,000 Units at 03/26/21 1348 ?  levETIRAcetam (KEPPRA) IVPB 500 mg/100 mL premix, 500 mg, Intravenous, Q12H, Para Skeans, MD, Last Rate: 400 mL/hr at 03/26/21 0932, 500 mg at 03/26/21 0932 ?  LORazepam (ATIVAN) injection 1 mg, 1 mg, Intravenous, Q6H PRN, Lorella Nimrod, MD, 1 mg at 03/26/21 1209 ?  metoprolol tartrate (LOPRESSOR) tablet 25 mg, 25 mg, Oral, BID, Florina Ou V, MD, 25 mg at 03/26/21 4765 ?  morphine (PF) 2 MG/ML injection 2 mg, 2 mg, Intravenous, Q4H PRN, Lorella Nimrod, MD, 2 mg at 03/26/21 1113 ?  sodium chloride flush (NS) 0.9 % injection 3 mL, 3 mL, Intravenous, Q12H, Florina Ou V, MD, 3 mL at 03/26/21 4650 ?  venlafaxine XR (EFFEXOR-XR) 24 hr capsule 150 mg, 150 mg, Oral, Q breakfast, Florina Ou V, MD, 150 mg at 03/26/21 3546 ? ?Facility-Administered Medications Ordered in Other Encounters:  ?  0.9 %  sodium chloride infusion, , Intravenous, Continuous, Tsosie Billing  D, PA-C ? ? ?Social History: ?Social History  ? ?Tobacco Use  ? Smoking status: Former  ?  Packs/day: 0.50  ?  Years: 20.00  ?  Pack years: 10.00  ?  Types: Cigarettes  ?  Start date: 01/13/2002  ?  Quit date: 10/17/2016  ?  Years since quitting: 4.4  ? Smokeless tobacco: Never  ?Vaping Use  ? Vaping Use: Never used  ?Substance Use Topics  ? Alcohol use: Yes  ?  Comment: Socially  ? Drug use: Never  ? ? ?Family Medical History: ?Family History  ?Problem Relation Age of Onset  ? Hypertension Father   ? Alcohol abuse Paternal Grandfather   ? Heart disease Paternal Grandfather   ? Alcohol abuse Maternal Grandfather   ? Heart disease Maternal Grandmother   ? Breast cancer Other   ? ? ?Physical Examination: ?Vitals:  ? 03/26/21 1300 03/26/21 1400  ?BP: (!) 146/93 (!) 146/95  ?Pulse: 100   ?Resp: (!) 36 (!) 27  ?Temp:    ?SpO2: 99%   ? ? ? ?General: Patient is well developed, well nourished, calm, collected, and in no apparent distress. ? ?Psychiatric: Patient is anxious. ? ?Head:  Pupils equal, round, and reactive to light. ? ?  ENT:  Oral mucosa appears well hydrated. ? ?Neck:   Supple.  Full range of motion. ? ?Respiratory: Patient is breathing without any significant difficulty. ? ?Extremities: No edema. ? ?Vascular: Palpable pulses in dorsal pedal vessels. ? ?Skin:   On exposed skin, there are no abnormal skin lesions. ? ?NEUROLOGICAL:  ?General: In no acute distress.  Her speech is slurred and slow, but understandable. ? ?She is not cooperative with full neurological exam, but appears to have intact cranial nerves and can move all 4 limbs with at least 4/5 strength. ? ?Sensory exam is unreliable.  Gait is untested due to critical illness. ? ?Imaging: ?MRI Brain 03/26/2021 ?IMPRESSION: ?1. Multiple contrast-enhancing lesions within the supratentorial and ?infratentorial brain, consistent with metastatic disease. No ?significant edema or mass effect. ?2. Diffuse dural thickening and contrast enhancement, new compared ?to  08/17/2020 concerning for dural metastatic disease. ?3. Multiple sites of ill-defined calvarial enhancement, consistent ?with osseous metastatic disease. ?  ?  ?Electronically Signed ?  By: Cletus Gash.

## 2021-03-26 NOTE — Progress Notes (Signed)
Patient +3 RASS while maxed out on Precedex and receiving PRN Dilaudid, Ativan and Morphine. Patient is thrashing in the bed attempting to get up. Not redirectable at all. She is kicking and pulling at staff/family who are attempting to keep her safe in the bed. [place] NP paged  and one time dose Ativan ordered. ?

## 2021-03-26 NOTE — Plan of Care (Signed)
?  Problem: Elimination: ?Goal: Will not experience complications related to urinary retention ?Outcome: Progressing ?  ?

## 2021-03-27 ENCOUNTER — Ambulatory Visit: Payer: BC Managed Care – PPO | Admitting: Radiation Oncology

## 2021-03-27 ENCOUNTER — Ambulatory Visit: Payer: BC Managed Care – PPO

## 2021-03-27 ENCOUNTER — Inpatient Hospital Stay: Payer: BC Managed Care – PPO

## 2021-03-27 DIAGNOSIS — C7931 Secondary malignant neoplasm of brain: Secondary | ICD-10-CM | POA: Diagnosis not present

## 2021-03-27 DIAGNOSIS — I10 Essential (primary) hypertension: Secondary | ICD-10-CM | POA: Diagnosis not present

## 2021-03-27 DIAGNOSIS — F411 Generalized anxiety disorder: Secondary | ICD-10-CM | POA: Diagnosis not present

## 2021-03-27 DIAGNOSIS — R400 Somnolence: Secondary | ICD-10-CM | POA: Diagnosis not present

## 2021-03-27 LAB — CBC WITH DIFFERENTIAL/PLATELET
Abs Immature Granulocytes: 0.87 10*3/uL — ABNORMAL HIGH (ref 0.00–0.07)
Basophils Absolute: 0.1 10*3/uL (ref 0.0–0.1)
Basophils Relative: 0 %
Eosinophils Absolute: 0 10*3/uL (ref 0.0–0.5)
Eosinophils Relative: 0 %
HCT: 31.3 % — ABNORMAL LOW (ref 36.0–46.0)
Hemoglobin: 10.2 g/dL — ABNORMAL LOW (ref 12.0–15.0)
Immature Granulocytes: 3 %
Lymphocytes Relative: 3 %
Lymphs Abs: 0.7 10*3/uL (ref 0.7–4.0)
MCH: 32.6 pg (ref 26.0–34.0)
MCHC: 32.6 g/dL (ref 30.0–36.0)
MCV: 100 fL (ref 80.0–100.0)
Monocytes Absolute: 1 10*3/uL (ref 0.1–1.0)
Monocytes Relative: 4 %
Neutro Abs: 24.5 10*3/uL — ABNORMAL HIGH (ref 1.7–7.7)
Neutrophils Relative %: 90 %
Platelets: 259 10*3/uL (ref 150–400)
RBC: 3.13 MIL/uL — ABNORMAL LOW (ref 3.87–5.11)
RDW: 15.5 % (ref 11.5–15.5)
Smear Review: NORMAL
WBC: 27.2 10*3/uL — ABNORMAL HIGH (ref 4.0–10.5)
nRBC: 0.1 % (ref 0.0–0.2)

## 2021-03-27 LAB — BASIC METABOLIC PANEL
Anion gap: 14 (ref 5–15)
BUN: 27 mg/dL — ABNORMAL HIGH (ref 6–20)
CO2: 23 mmol/L (ref 22–32)
Calcium: 10.9 mg/dL — ABNORMAL HIGH (ref 8.9–10.3)
Chloride: 105 mmol/L (ref 98–111)
Creatinine, Ser: 0.82 mg/dL (ref 0.44–1.00)
GFR, Estimated: >60 mL/min (ref >60)
Glucose, Bld: 100 mg/dL — ABNORMAL HIGH (ref 70–99)
Potassium: 3.8 mmol/L (ref 3.5–5.1)
Sodium: 142 mmol/L (ref 135–145)

## 2021-03-27 LAB — PROTIME-INR
INR: 1.3 — ABNORMAL HIGH (ref 0.8–1.2)
Prothrombin Time: 16.3 seconds — ABNORMAL HIGH (ref 11.4–15.2)

## 2021-03-27 LAB — PHOSPHORUS: Phosphorus: 3 mg/dL (ref 2.5–4.6)

## 2021-03-27 LAB — MAGNESIUM: Magnesium: 1.6 mg/dL — ABNORMAL LOW (ref 1.7–2.4)

## 2021-03-27 LAB — PTH, INTACT AND CALCIUM
Calcium, Total (PTH): 11.7 mg/dL — ABNORMAL HIGH (ref 8.7–10.2)
PTH: 10 pg/mL — ABNORMAL LOW (ref 15–65)

## 2021-03-27 MED ORDER — HYDROMORPHONE BOLUS VIA INFUSION
1.0000 mg | INTRAVENOUS | Status: DC | PRN
  Administered 2021-03-27 – 2021-03-28 (×6): 1 mg via INTRAVENOUS
  Filled 2021-03-27: qty 1

## 2021-03-27 MED ORDER — GLYCOPYRROLATE 0.2 MG/ML IJ SOLN
0.2000 mg | INTRAMUSCULAR | Status: DC | PRN
  Administered 2021-03-27 – 2021-03-28 (×5): 0.2 mg via INTRAVENOUS
  Filled 2021-03-27 (×6): qty 1

## 2021-03-27 MED ORDER — FENTANYL CITRATE PF 50 MCG/ML IJ SOSY
PREFILLED_SYRINGE | INTRAMUSCULAR | Status: AC
  Administered 2021-03-27: 25 ug via INTRAVENOUS
  Filled 2021-03-27: qty 1

## 2021-03-27 MED ORDER — SODIUM CHLORIDE 0.9 % IV SOLN
0.5000 mg/h | INTRAVENOUS | Status: DC
  Administered 2021-03-27: 1 mg/h via INTRAVENOUS
  Administered 2021-03-27: 2 mg/h via INTRAVENOUS
  Administered 2021-03-28: 4 mg/h via INTRAVENOUS
  Filled 2021-03-27 (×4): qty 2.5

## 2021-03-27 MED ORDER — MAGNESIUM SULFATE 2 GM/50ML IV SOLN
2.0000 g | Freq: Once | INTRAVENOUS | Status: AC
  Administered 2021-03-27: 2 g via INTRAVENOUS
  Filled 2021-03-27: qty 50

## 2021-03-27 MED ORDER — DEXTROSE 5 % IV SOLN: INTRAVENOUS | Status: DC

## 2021-03-27 MED ORDER — FENTANYL CITRATE PF 50 MCG/ML IJ SOSY: 25.0000 ug | PREFILLED_SYRINGE | Freq: Once | INTRAMUSCULAR | Status: AC

## 2021-03-27 MED ORDER — ACETAMINOPHEN 325 MG PO TABS: 650.0000 mg | ORAL_TABLET | Freq: Four times a day (QID) | ORAL | Status: DC | PRN

## 2021-03-27 MED ORDER — POLYVINYL ALCOHOL 1.4 % OP SOLN
1.0000 [drp] | Freq: Four times a day (QID) | OPHTHALMIC | Status: DC | PRN
  Filled 2021-03-27: qty 15

## 2021-03-27 MED ORDER — ACETAMINOPHEN 650 MG RE SUPP: 650.0000 mg | Freq: Four times a day (QID) | RECTAL | Status: DC | PRN

## 2021-03-27 MED ORDER — GLYCOPYRROLATE 1 MG PO TABS
1.0000 mg | ORAL_TABLET | ORAL | Status: DC | PRN
  Filled 2021-03-27: qty 1

## 2021-03-27 MED ORDER — LORAZEPAM 2 MG/ML IJ SOLN
2.0000 mg | INTRAMUSCULAR | Status: DC | PRN
  Administered 2021-03-27: 4 mg via INTRAVENOUS
  Administered 2021-03-27: 2 mg via INTRAVENOUS
  Administered 2021-03-27 – 2021-03-28 (×3): 4 mg via INTRAVENOUS
  Filled 2021-03-27 (×3): qty 2
  Filled 2021-03-27: qty 1
  Filled 2021-03-27: qty 2

## 2021-03-27 MED ORDER — CHLORHEXIDINE GLUCONATE CLOTH 2 % EX PADS
6.0000 | MEDICATED_PAD | Freq: Every day | CUTANEOUS | Status: DC
  Administered 2021-03-27: 6 via TOPICAL

## 2021-03-27 MED ORDER — DIPHENHYDRAMINE HCL 50 MG/ML IJ SOLN: 25.0000 mg | INTRAMUSCULAR | Status: DC | PRN

## 2021-03-27 MED ORDER — MIDAZOLAM HCL 2 MG/2ML IJ SOLN
2.0000 mg | Freq: Once | INTRAMUSCULAR | Status: AC
  Administered 2021-03-27: 2 mg via INTRAVENOUS
  Filled 2021-03-27: qty 2

## 2021-03-27 MED ORDER — GLYCOPYRROLATE 0.2 MG/ML IJ SOLN: 0.2000 mg | INTRAMUSCULAR | Status: DC | PRN

## 2021-03-27 NOTE — Assessment & Plan Note (Signed)
Patient with very extensive metastatic disease. ?Unable to obtain liver biopsy as planned to see if there is any evolution in her prior breast cancer due to being unstable and very agitated. ?Unable to obtain Simulation for brain radiation due to excessive agitation. ?Family is not leaning towards comfort care ?

## 2021-03-27 NOTE — Assessment & Plan Note (Signed)
Hypercalcemia of malignancy.  ?Calcium appropriately trending down. ?Received calcitonin and ZA. ?Monitor on tele in stepdown as pt is high risk for arrhythmias.  ?Monitor telemetry and intervals.  ? ?

## 2021-03-27 NOTE — Progress Notes (Signed)
Spoke with primary RN Legrand Como, regarding order for PICC. Patient now comfort care, RN to ask MD if PICC order to be discontinued or still wanted. ?

## 2021-03-27 NOTE — Progress Notes (Signed)
?   03/27/21 1400  ?Clinical Encounter Type  ?Visited With Patient  ?Visit Type Initial  ?Referral From Nurse  ?Consult/Referral To Chaplain  ? ?Chaplain responded to nurse page. Patient being moved to comfort care. Chaplain provided compassionate presence and prayer. Chaplain provided support for extended family members. Chaplain also provided a prayer shawl to husband. Family appreciated Chaplain support. ?

## 2021-03-27 NOTE — Consult Note (Signed)
PHARMACY CONSULT NOTE ? ?Pharmacy Consult for Electrolyte Monitoring and Replacement  ? ?Recent Labs: ?Potassium (mmol/L)  ?Date Value  ?03/27/2021 3.8  ? ?Magnesium (mg/dL)  ?Date Value  ?03/27/2021 1.6 (L)  ? ?Calcium (mg/dL)  ?Date Value  ?03/27/2021 10.9 (H)  ? ?Calcium, Total (PTH) (mg/dL)  ?Date Value  ?03/10/2021 10.7 (H)  ? ?Albumin (g/dL)  ?Date Value  ?03/29/2021 2.8 (L)  ? ?Phosphorus (mg/dL)  ?Date Value  ?03/27/2021 3.0  ? ?Sodium (mmol/L)  ?Date Value  ?03/27/2021 142  ? 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 Found to have severe hypercalcemia. Pharmacy has been consulted to monitor and replace electrolytes as needed. ? ?Medications: ?Zometa 3/5, 3/21 ?Calcitonin 3/21 ?NS at 150 cc/hr (completed) ? ?Goal of Therapy:  ?Electrolytes within normal limits  ? ?Plan:  ?--Ca 14.5 (admit) >> 10.9 (not corrected). Improving with implemented therapies ?--Mg 1.6, IV magnesium sulfate 2 g x 1 ?--Follow-up electrolytes with AM labs tomorrow ? ?Benita Gutter  ?03/27/2021 ?7:44 AM ? ?

## 2021-03-27 NOTE — Progress Notes (Signed)
?Progress Note ? ? ?Patient: Kaylee Romero IEP:[id number] DOB: 1980/03/01 DOA: 03/11/2021     2 ?DOS: the patient was seen and examined on 03/27/2021 ?  ?Brief hospital course: ?Taken from an prior notes. ? ?Kaylee Romero is a 41 y.o. female with medical history significant of widely metastasized stage IV breast cancer and  GAD was admitted last night with concern of altered mental status and lethargy for the past 3 days. ? ?She was found to have hypercalcemia and new diagnosis of multiple brain mets on MRI brain, which shows supratentorial and infratentorial brain consistent with widespread metastatic disease.  She also has diffuse dural thickening concerning for dural metastatic disease.  She also has multiple ill-defined calvarial enhancement consistent with osseous metastatic disease. ? ?Patient was given calcitonin and zoledronic acid along with aggressive hydration, calcium not trending down. ? ?She was also started on IV steroid and radiation oncology was consulted and they are planning to start full brain radiation on Monday.  Patient was also scheduled to get chest wall radiation for bone mets.  She has previously received palliative radiation to spine. ? ?Patient was scheduled to have her liver biopsy and port placement today with IR, had a discussion with her oncologist Dr. Marc Morgans on an IR and decided to proceed with PICC line at this time and biopsy and port can be postponed till discharge.  PICC line ordered. ? ?Patient is having worsening anxiety and pain.  Giving morphine and Ativan. ?Family also requested a meeting with her oncologist and palliative care from cancer center, most likely this afternoon after Dr. Grayland Ormond finishes his clinic. ?Also started her on Precedex infusion and Dilaudid was added as we were unable to control her anxiety and pain. ? ?There was also concern of sepsis with leukocytosis, sepsis ruled out.  No obvious source of infection at this time.  Procalcitonin at 0.16 but she  is having active malignancy.  Cefepime and vancomycin was discontinued and we will observe her without any antibiotics at this time. ?She has worsening leukocytosis most likely secondary to her disease burden and now with IV steroid. ? ?3/23: Patient was sedated when seen during morning rounds.  She was very agitated overnight. ?Radiation oncology deferred simulation as she has to be more calmer.  Had a long discussion back-and-forth with family, initially they want aggressive measures stating that we are already losing the battle and she wants to fight till the very end.  Discussed with PCCM to see if it is a possibility for intubation to get liver biopsy, patient was not really responding to recent chemo with very aggressive disease progression, there was a concern of evolution of her prior breast cancer. ?Patient has very extensive pulmonary metastasis with extensive involvement of spine and ribs and other bony lesions.  After discussion with PCCM and family, they are leaning more toward comfort care. ? ? ? ?Assessment and Plan: ?* AMS (altered mental status) ?Pt coming to [place] with lethargy and somnolent state attribute to combination of hypercalcemia and brain mets. ?Hypercalcemia improving but she remained very anxious.  She was also placed on Decadron and Keppra per neurosurgery, Decadron might be contributing to her anxiety along with uncontrolled pain. ?Talk with neurosurgery and they would like to continue Keppra and deferring the decision about Decadron to radiation oncology-message was sent. ?Started her on Precedex infusion for uncontrolled anxiety as she was not responding to Ativan.  Currently sedating. ?-Continue to monitor ?-Family is not leaning towards comfort care ?-Patient is  very high risk for deterioration and death. ? ?Hypercalcemia ?Hypercalcemia of malignancy.  ?Calcium appropriately trending down. ?Received calcitonin and ZA. ?Monitor on tele in stepdown as pt is high risk for arrhythmias.   ?Monitor telemetry and intervals.  ? ? ?Sepsis ([place]) ?Sepsis ruled out.  Leukocytosis most likely secondary to disease burden and now worsening with steroid.  Barely positive procalcitonin and no source of infection.  Preliminary cultures negative. ?-Stop antibiotics and observe ? ? ?Brain metastasis ([place]) ?Patient with widespread metastatic disease, history of ER positive stage IV breast cancer not responding to current management as disease continue to spread.  Now with multiple brain and skull mets.  Dr. Grayland Ormond is aware ?Plan is to start palliative whole brain radiation on Monday. ?Unable to place port and do liver biopsy as she is unstable. ?PICC line ordered-unable to do at this time due to extreme anxiety. ?Family is waiting for her oncologist visit to make further decisions. ?Very difficult situation and extremely high risk for deterioration and death. ?-Continue with Precedex infusion ?-Continue with morphine and Dilaudid. ?-Continue with Keppra and Decadron for now ?-Monitor for any respiratory compromise, patient is still full code. ?  ? ?Essential hypertension ?Blood pressure variable, currently within goal. ?-Continue with metoprolol ?-Continue to monitor ? ? ?GAD (generalized anxiety disorder) ?Patient with worsening anxiety and very restless.  Pain is also contributory.  She was also placed on Decadron which can cause increase in anxiety. ?Message sent to radiation oncology if she has to continue with steroid. ?She was started on Precedex infusion as as needed Ativan was not working. ?-Continue with Precedex infusion for now ?-Continue venlafaxine.  ? ?Hypokalemia ?Magnesium levels at 1.6 ?Replace potassium and magnesium. ?Continue to monitor ? ? ? ?Metastatic breast cancer ([place]) ?Patient with very extensive metastatic disease. ?Unable to obtain liver biopsy as planned to see if there is any evolution in her prior breast cancer due to being unstable and very agitated. ?Unable to obtain Simulation  for brain radiation due to excessive agitation. ?Family is not leaning towards comfort care ? ?  ? ?Subjective: Patient was sedated with multiple family members at bedside.  She was quite agitated and restless overnight. ? ?Physical Exam: ?Vitals:  ? 03/27/21 1220 03/27/21 1300 03/27/21 1400 03/27/21 1500  ?BP:  101/86 (!) 122/95   ?Pulse:  (!) 129 (!) 112 (!) 114  ?Resp:    19  ?Temp: 97.7 ?F (36.5 ?C)     ?TempSrc: Axillary     ?SpO2:  96% 95% 98%  ?Weight:      ?Height:      ? ?General.     In no acute distress. ?Pulmonary.  Lungs clear bilaterally, normal respiratory effort. ?CV.  Regular rate and rhythm, no JVD, rub or murmur. ?Abdomen.  Soft, nontender, nondistended, BS positive. ?CNS.  Sedated ?Extremities.  No edema, no cyanosis, pulses intact and symmetrical. ?Psychiatry.  Judgment and insight appears normal. ? ?Data Reviewed: ?Prior notes and labs reviewed ? ?Family Communication: Discussed with multiple family members at bedside. ? ?Disposition: ?Status is: Inpatient ?Remains inpatient appropriate because: Severity of illness ? ? Planned Discharge Destination: To be determined ? ? ? ?Time spent: 50 minutes ? ?This record has been created using Systems analyst. Errors have been sought and corrected,but may not always be located. Such creation errors do not reflect on the standard of care. ? ?Author: ?Lorella Nimrod, MD ?03/27/2021 3:43 PM ? ?For on call review [email or web address].  ?

## 2021-03-27 NOTE — Consult Note (Signed)
? ?NAME:  Kaylee Romero, MRN:  [id number], DOB:  [birth year], LOS: 2 ?ADMISSION DATE:  03/14/2021 ? ? ?History of Present Illness:  ? ?CHIEF COMPLAINT: Progressive stage IV breast cancer now with new brain mets, altered mental status. ?  ?INTERVAL HISTORY: Patient is a 41 year old female with progressive stage IV breast cancer who was recently admitted to the ICU with altered mental status, hypercalcemia, and multiple new brain metastasis.  She is currently sedated and review of systems is unobtainable. ? ? ?Pertinent  Medical History  ?Dx with BREAST CANCER 11/2018 ?LUNG METS 07/2020 ? ? ?Significant Hospital Events: ?Including procedures, antibiotic start and stop dates in addition to other pertinent events   ?Admitted for confusion, MRI has brain mets, hypercalcemia ? ? ? ? ?Interim History / Subjective:  ? ?Patient has lung damage, brain damage, liver damage and rib and spinal damage from metastatic cancer ?Patient has increased WOB and using accessory muscles to breathe ?Patient at high risk for cardiac arrest and intubation ? ? ? ?Objective   ?Blood pressure (!) 141/93, pulse (!) 115, temperature (!) 97.5 ?F (36.4 ?C), temperature source Axillary, resp. rate (!) 25, height '5\' 4"'$  (1.626 m), weight 74.6 kg, last menstrual period 01/06/2019, SpO2 96 %. ?   ?   ? ?Intake/Output Summary (Last 24 hours) at 03/27/2021 1208 ?Last data filed at 03/27/2021 0700 ?Gross per 24 hour  ?Intake 1221 ml  ?Output 375 ml  ?Net 846 ml  ? ?Filed Weights  ? 04/01/2021 1551 03/20/2021 2040  ?Weight: 70.3 kg 74.6 kg  ? ? ? ? ?REVIEW OF SYSTEMS ? ?PATIENT IS UNABLE TO PROVIDE COMPLETE REVIEW OF SYSTEMS DUE TO SEVERE CRITICAL ILLNESS AND TOXIC METABOLIC ENCEPHALOPATHY ? ? ?PHYSICAL EXAMINATION: ? ?GENERAL:critically ill appearing, +resp distress ?EYES: Pupils equal, round, reactive to light.  No scleral icterus.  ?MOUTH: Moist mucosal membrane.  ?NECK: Supple.  ?PULMONARY: +rhonchi, +wheezing ?CARDIOVASCULAR: S1 and S2.  No murmurs   ?GASTROINTESTINAL: Soft, nontender, -distended. Positive bowel sounds.  ?MUSCULOSKELETAL: No swelling, clubbing, or edema.  ?NEUROLOGIC: obtunded ?SKIN:intact,warm,dry ? ? ? ? ?Labs/imaging that I havepersonally reviewed  ?(right click and "Reselect all SmartList Selections" daily)  ? ? ? ? ?ASSESSMENT AND PLAN ?SYNOPSIS ? ?41 yo WF with rapidly progressive breast cancer with mets with severe damage to brain, bones, lungs and liver.  ? ?Patient is suffering and dying process ?I recommend DNR/DNI and frank discussion with family ? ? ?Severe ACUTE Hypoxic and Hypercapnic Respiratory Failure ?Due to lung mets ? ? ?CARDIAC ?ICU monitoring ? ? ?Kidney ?-continue Foley Catheter-assess need ?-Avoid nephrotoxic agents ?-Follow urine output, BMP ?-Ensure adequate renal perfusion, optimize oxygenation ?-Renal dose medications ? ? ?Intake/Output Summary (Last 24 hours) at 03/27/2021 1208 ?Last data filed at 03/27/2021 0700 ?Gross per 24 hour  ?Intake 1221 ml  ?Output 375 ml  ?Net 846 ml  ? ? ?  Latest Ref Rng & Units 03/27/2021  ?  3:38 AM 03/26/2021  ? 12:09 PM 03/26/2021  ?  2:29 AM  ?BMP  ?Glucose 70 - 99 mg/dL 100   100     ?BUN 6 - 20 mg/dL 27   20     ?Creatinine 0.44 - 1.00 mg/dL 0.82   0.78     ?Sodium 135 - 145 mmol/L 142   139     ?Potassium 3.5 - 5.1 mmol/L 3.8   3.4     ?Chloride 98 - 111 mmol/L 105   102     ?CO2 22 -  32 mmol/L 23   25     ?Calcium 8.9 - 10.3 mg/dL 10.9   11.3   12.1    ? ? ?SEVERE HYPERCALCEMIA ?DUE TO METS DISEASE ? ? ?NEUROLOGY ?Acute toxic metabolic encephalopathy-due to brain mets ?Grave prognosis ? ?ENDO ?- ICU hypoglycemic\Hyperglycemia protocol ?-check FSBS per protocol ? ? ?GI ?GI PROPHYLAXIS as indicated ? ?NUTRITIONAL STATUS ?DIET-->NPO ?Constipation protocol as indicated ? ? ?ELECTROLYTES ?-follow labs as needed ?-replace as needed ?-pharmacy consultation and following ? ? ?ACUTE ANEMIA- ?TRANSFUSE AS NEEDED ?CONSIDER TRANSFUSION  IF HGB<7 ?DVT PRX with TED/SCD's ONLY ? ? ? ? ?Best  practice (right click and "Reselect all SmartList Selections" daily)  ?Diet: NPO ?Foley:  N/A ?Mobility:  bed rest  ?Code Status:  FULL ?Disposition:ICU ? ?Labs   ?CBC: ?Recent Labs  ?Lab 03/21/2021 ?1559 03/26/21 ?1209 03/27/21 ?4034  ?WBC 21.5* 29.6* 27.2*  ?NEUTROABS 18.7*  --  24.5*  ?HGB 12.1 10.9* 10.2*  ?HCT 36.3 32.8* 31.3*  ?MCV 96.5 97.9 100.0  ?PLT 275 294 259  ? ? ?Basic Metabolic Panel: ?Recent Labs  ?Lab 03/17/2021 ?1559 03/17/2021 ?1820 03/22/2021 ?1950 03/26/21 ?0229 03/26/21 ?1209 03/27/21 ?7425  ?NA 137  --  140  --  139 142  ?K 3.3*  --  3.1*  --  3.4* 3.8  ?CL 93*  --  100  --  102 105  ?CO2 34*  --  32  --  25 23  ?GLUCOSE 92  --  107*  --  100* 100*  ?BUN 25*  --  23*  --  20 27*  ?CREATININE 0.93  --  0.75  --  0.78 0.82  ?CALCIUM 14.5*  --  12.6*  12.7* 12.1* 11.3* 10.9*  ?MG  --  1.7  --  1.5*  --  1.6*  ?PHOS  --   --   --  2.0*  --  3.0  ? ?GFR: ?Estimated Creatinine Clearance: 88.5 mL/min (by C-G formula based on SCr of 0.82 mg/dL). ?Recent Labs  ?Lab 03/17/2021 ?1559 04/01/2021 ?1603 03/08/2021 ?1910 03/26/21 ?0229 03/26/21 ?1209 03/27/21 ?9563  ?PROCALCITON  --   --   --  0.16  --   --   ?WBC 21.5*  --   --   --  29.6* 27.2*  ?LATICACIDVEN  --  3.2* 1.8  --   --   --   ? ? ?Liver Function Tests: ?Recent Labs  ?Lab 03/21/2021 ?1559 03/27/2021 ?1950  ?AST 234* 203*  ?ALT 70* 64*  ?ALKPHOS 132* 114  ?BILITOT 0.9 1.0  ?PROT 6.8 6.3*  ?ALBUMIN 3.2* 2.8*  ? ?Recent Labs  ?Lab 03/12/2021 ?1559  ?LIPASE 20  ? ?No results for input(s): AMMONIA in the last 168 hours. ? ?ABG ?   ?Component Value Date/Time  ? PHART 7.435 07/25/2020 0953  ? PCO2ART 39.6 07/25/2020 0953  ? PO2ART 85.3 07/25/2020 0953  ? HCO3 26.1 07/25/2020 0953  ? O2SAT 97.0 07/25/2020 0953  ?  ? ?Coagulation Profile: ?Recent Labs  ?Lab 03/26/21 ?0229 03/26/21 ?1209 03/27/21 ?0934  ?INR 1.1 1.1 1.3*  ? ? ?Cardiac Enzymes: ?No results for input(s): CKTOTAL, CKMB, CKMBINDEX, TROPONINI in the last 168 hours. ? ?HbA1C: ?Hgb A1c MFr Bld  ?Date/Time Value  Ref Range Status  ?02/25/2021 04:27 AM 5.0 4.8 - 5.6 % Final  ?  Comment:  ?  (NOTE) ?        Prediabetes: 5.7 - 6.4 ?        Diabetes: >6.4 ?  Glycemic control for adults with diabetes: <7.0 ?  ? ? ?CBG: ?Recent Labs  ?Lab 03/22/2021 ?2037  ?GLUCAP 109*  ? ? ? ?Past Medical History:  ?She,  has a past medical history of Chronic constipation, Chronic narcotic use, Chronic nausea, Family history of breast cancer, GAD (generalized anxiety disorder), History of pregnancy induced hypertension, HSV (herpes simplex virus) anogenital infection, Lung metastasis ([place]) (07/2020), Malignant neoplasm of upper-outer quadrant of right breast in female, estrogen receptor positive ([place]), and SOB (shortness of breath).  ? ?Surgical History:  ? ?Past Surgical History:  ?Procedure Laterality Date  ? ABDOMINAL HYSTERECTOMY N/A 02/17/2021  ? BREAST LUMPECTOMY WITH AXILLARY LYMPH NODE BIOPSY Right 11/21/2018  ? Procedure: RIGHT BREAST LUMPECTOMY WITH SENTINEL LYMPH NODE BIOPSY;  Surgeon: Jovita Kussmaul, MD;  Location: [facility];  Service: General;  Laterality: Right;  ? BREAST REDUCTION SURGERY Bilateral 11/21/2018  ? Procedure: BILATERAL MAMMARY REDUCTION  (BREAST);  Surgeon: Wallace Going, DO;  Location: [facility];  Service: Plastics;  Laterality: Bilateral;  ? CHEST TUBE INSERTION Left 07/29/2020  ? Procedure: INSERTION PLEURAL DRAINAGE CATHETER;  Surgeon: Lajuana Matte, MD;  Location: [facility];  Service: Thoracic;  Laterality: Left;  ? IRRIGATION AND DEBRIDEMENT STERNOCLAVICULAR JOINT-STERNUM AND RIBS N/A 08/13/2020  ? Procedure: IRRIGATION AND DEBRIDEMENT CHEST WALL ABSCESS;  Surgeon: Lajuana Matte, MD;  Location: [facility];  Service: Cardiothoracic;  Laterality: N/A;  ? IUD REMOVAL N/A 02/17/2021  ? Procedure: INTRAUTERINE DEVICE (IUD) REMOVAL;  Surgeon: Linda Hedges, DO;  Location: [facility];  Service: Gynecology;  Laterality: N/A;  ? LAPAROSCOPIC SALPINGO OOPHERECTOMY Bilateral 02/17/2021  ? Procedure:  LAPAROSCOPIC BILATERAL SALPINGO OOPHORECTOMY;  Surgeon: Linda Hedges, DO;  Location: [facility];  Service: Gynecology;  Laterality: Bilateral;  ? LEFT HEART CATH AND CORONARY ANGIOGRAPHY N/A 02/25/2021

## 2021-03-27 NOTE — Consult Note (Signed)
1730: Consult was for anxiety/pain. Prior to responding to consult, patient was placed on comfort care measures and on dilaudid drip at this time. Patient was asleep, surrounded by family. I did not interrupt. Patient's bedside nurses in agreement.  Dr. Weber Cooks aware.  ? ?

## 2021-03-27 NOTE — Plan of Care (Signed)
°  Problem: Coping: °Goal: Level of anxiety will decrease °Outcome: Progressing °  °

## 2021-03-27 NOTE — Assessment & Plan Note (Signed)
Pt coming to [place] with lethargy and somnolent state attribute to combination of hypercalcemia and brain mets. ?Hypercalcemia improving but she remained very anxious.  She was also placed on Decadron and Keppra per neurosurgery, Decadron might be contributing to her anxiety along with uncontrolled pain. ?Talk with neurosurgery and they would like to continue Keppra and deferring the decision about Decadron to radiation oncology-message was sent. ?Started her on Precedex infusion for uncontrolled anxiety as she was not responding to Ativan.  Currently sedating. ?-Continue to monitor ?-Family is not leaning towards comfort care ?-Patient is very high risk for deterioration and death. ?

## 2021-03-27 NOTE — Assessment & Plan Note (Signed)
Magnesium levels at 1.6 ?Replace potassium and magnesium. ?Continue to monitor ? ? ?

## 2021-03-27 NOTE — Assessment & Plan Note (Signed)
Sepsis ruled out.  Leukocytosis most likely secondary to disease burden and now worsening with steroid.  Barely positive procalcitonin and no source of infection.  Preliminary cultures negative. ?-Stop antibiotics and observe ? ?

## 2021-03-27 NOTE — Plan of Care (Signed)
? ?  Interdisciplinary Goals of Care Family Meeting ? ? ?Date carried out: 03/27/2021 ? ?Location of the meeting: Unit ? ?Member's involved: Physician, Bedside Registered Nurse, and Family Member or next of kin ? ?Durable Power of Attorney or acting medical decision maker: Husband Tommi Rumps   ? ? ?Code status: Full DNR ? ?Disposition: In-patient comfort care ? ? ? ?GOALS OF CARE DISCUSSION ? ?The Clinical status was relayed to family in detail- ? ?Updated and notified of patients medical condition- ?Patient remains unresponsive and will not open eyes to command.   ?Patient is having a weak cough and struggling to remove secretions.   ?Patient with increased WOB and using accessory muscles to breathe ?Explained to family course of therapy and the modalities  ? ?Patient with Progressive multiorgan failure with a very high probablity of a very minimal chance of meaningful recovery despite all aggressive and optimal medical therapy.  ? ? CT SCANS AND MRI SCANS AND CXR HAVE BEEN REVIEWED IN DETAIL WITH FAMILY ? ? ?Patient has severe multiorgan failure damage due to metastatic cancer ?(Brain damage, lung damage, liver damage and lung damage) ? ?Family understands the situation.(Mother,father, brothers,husband) ?PATIENT IS SUFFERING AND DYING PROCESS ?THE FAMILY DOES NOT WANT HER TO SUFFER ANYMORE ? ?They ALL have consented and agreed to DNR/DNI status and would like to proceed with Comfort care measures. ? ?Family are satisfied with Plan of action and management. All questions answered ? ?Additional CC time 35 mins ? ? ?Corrin Parker, M.D.  ?[facility]  ?Medical Director [facility] ?Medical Director [facility] Cardio-Pulmonary Department  ? ? ?

## 2021-03-28 ENCOUNTER — Other Ambulatory Visit: Payer: Self-pay

## 2021-03-28 ENCOUNTER — Ambulatory Visit: Payer: BC Managed Care – PPO

## 2021-03-28 DIAGNOSIS — R400 Somnolence: Secondary | ICD-10-CM | POA: Diagnosis not present

## 2021-03-28 DIAGNOSIS — Z515 Encounter for palliative care: Secondary | ICD-10-CM | POA: Diagnosis not present

## 2021-03-28 MED ORDER — MORPHINE SULFATE (CONCENTRATE) 10 MG/0.5ML PO SOLN
5.0000 mg | ORAL | Status: DC | PRN
  Administered 2021-03-28: 5 mg via SUBLINGUAL
  Filled 2021-03-28: qty 0.5

## 2021-03-28 MED ORDER — SODIUM CHLORIDE 0.9 % IV SOLN
0.5000 mg/h | INTRAVENOUS | Status: DC
  Administered 2021-03-28: 6 mg/h via INTRAVENOUS
  Filled 2021-03-28: qty 5

## 2021-03-28 MED ORDER — HALOPERIDOL LACTATE 5 MG/ML IJ SOLN
1.0000 mg | INTRAMUSCULAR | Status: DC | PRN
  Administered 2021-03-28: 2 mg via INTRAVENOUS
  Filled 2021-03-28: qty 1

## 2021-03-28 MED ORDER — MORPHINE SULFATE (CONCENTRATE) 10 MG/0.5ML PO SOLN: 5.0000 mg | ORAL | Status: DC | PRN

## 2021-03-28 MED ORDER — LORAZEPAM 2 MG/ML IJ SOLN
2.0000 mg | INTRAMUSCULAR | Status: DC | PRN
  Administered 2021-03-28 (×7): 4 mg via INTRAVENOUS
  Filled 2021-03-28 (×7): qty 2

## 2021-03-28 MED ORDER — HYDROMORPHONE BOLUS VIA INFUSION
2.0000 mg | INTRAVENOUS | Status: DC | PRN
  Administered 2021-03-28 (×11): 2 mg via INTRAVENOUS
  Filled 2021-03-28: qty 2

## 2021-03-28 MED ORDER — GLYCOPYRROLATE 0.2 MG/ML IJ SOLN
0.2000 mg | Freq: Once | INTRAMUSCULAR | Status: AC
  Administered 2021-03-28: 0.2 mg via INTRAVENOUS
  Filled 2021-03-28: qty 1

## 2021-03-28 MED ORDER — SCOPOLAMINE 1 MG/3DAYS TD PT72
1.0000 | MEDICATED_PATCH | TRANSDERMAL | Status: DC
  Administered 2021-03-28: 1.5 mg via TRANSDERMAL
  Filled 2021-03-28: qty 1

## 2021-03-28 MED ORDER — GLYCOPYRROLATE 0.2 MG/ML IJ SOLN
0.2000 mg | INTRAMUSCULAR | Status: DC | PRN
  Administered 2021-03-28: 0.2 mg via INTRAVENOUS
  Filled 2021-03-28: qty 1

## 2021-03-28 MED ORDER — ATROPINE SULFATE 1 % OP SOLN
2.0000 [drp] | Freq: Four times a day (QID) | OPHTHALMIC | Status: DC | PRN
  Administered 2021-03-28: 2 [drp] via SUBLINGUAL
  Filled 2021-03-28: qty 2

## 2021-03-30 LAB — CULTURE, BLOOD (ROUTINE X 2)
Culture: NO GROWTH
Culture: NO GROWTH
Special Requests: ADEQUATE

## 2021-03-31 ENCOUNTER — Ambulatory Visit: Payer: BC Managed Care – PPO

## 2021-04-01 ENCOUNTER — Ambulatory Visit: Payer: BC Managed Care – PPO

## 2021-04-02 ENCOUNTER — Ambulatory Visit: Payer: BC Managed Care – PPO

## 2021-04-02 LAB — OPIATES,MS,WB/SP RFX
6-Acetylmorphine: NEGATIVE
Codeine: NEGATIVE ng/mL
Dihydrocodeine: NEGATIVE ng/mL
Hydrocodone: NEGATIVE ng/mL
Hydromorphone: NEGATIVE ng/mL
Morphine: 30.7 ng/mL
Opiate Confirmation: POSITIVE

## 2021-04-02 LAB — DRUG SCREEN 10 W/CONF, SERUM
Amphetamines, IA: NEGATIVE ng/mL
Barbiturates, IA: NEGATIVE ug/mL
Benzodiazepines, IA: NEGATIVE ng/mL
Cocaine & Metabolite, IA: NEGATIVE ng/mL
Methadone, IA: NEGATIVE ng/mL
Opiates, IA: POSITIVE ng/mL — AB
Oxycodones, IA: NEGATIVE ng/mL
Phencyclidine, IA: NEGATIVE ng/mL
Propoxyphene, IA: NEGATIVE ng/mL
THC(Marijuana) Metabolite, IA: NEGATIVE ng/mL

## 2021-04-02 LAB — CYTOCHROME P450 2D6/2C19
2C19 Metabolic Activity:: NORMAL
2D6 Metabolic Activity:: NORMAL

## 2021-04-02 LAB — OXYCODONES,MS,WB/SP RFX
Oxycocone: NEGATIVE ng/mL
Oxycodones Confirmation: NEGATIVE
Oxymorphone: NEGATIVE ng/mL

## 2021-04-02 LAB — CYTOCHROME P450 3A4/3A5

## 2021-04-03 ENCOUNTER — Ambulatory Visit: Payer: BC Managed Care – PPO

## 2021-04-03 ENCOUNTER — Other Ambulatory Visit: Payer: BC Managed Care – PPO

## 2021-04-04 ENCOUNTER — Ambulatory Visit: Payer: BC Managed Care – PPO

## 2021-04-05 NOTE — Progress Notes (Signed)
Patient appears to be resting comfortably in bed surrounded by family and loved ones. PRN medications and titratable drips utilized throughout the night to assure comfort. Education and support provided to family. Around 0200 patient began to display some signs of respiratory distress - agonal breathing and gasping for air. Comfort care medication dosages and frequencies were adjusted and administered in order to achieve adequate comfort. Family remained at the bedside all night.  ?

## 2021-04-05 NOTE — Progress Notes (Signed)
Nutrition Brief Note  Chart reviewed. Pt now transitioning to comfort care.  No further nutrition interventions planned at this time.  Please re-consult as needed.   Sundy Houchins W, RD, LDN, CDCES Registered Dietitian II Certified Diabetes Care and Education Specialist Please refer to [facility] for RD and/or RD on-call/weekend/after hours pager   

## 2021-04-05 NOTE — Consult Note (Signed)
? ?  ?Palliative Medicine ?[place] at [facility] ?[phone] [phone] ? ? ?Name: Kaylee Romero ?Date: 2021-04-08 ?MRN: [id number]  ?DOB: [birth year] ? ?Patient Care Team: ?Dion Body, MD as PCP - General (Family Medicine) ?Gery Pray, MD as Consulting Physician (Radiation Oncology) ?Jovita Kussmaul, MD as Consulting Physician (General Surgery) ?Dillingham, Loel Lofty, DO as Attending Physician (Plastic Surgery) ?Lloyd Huger, MD as Consulting Physician (Oncology)  ? ? ?REASON FOR CONSULTATION: ?Kaylee Romero is a 41 y.o. 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  Patient then developed recurrence on tamoxifen.  Patient developed worsening shortness of breath and was found to have recurrence in July 2022 with malignant pleural effusion ultimately requiring VATS procedure and Pleurx.  Patient is status post BSO on 02/17/2021. Patient has had second and third opinions from [facility] and [place].  She had progression on third line chemotherapy for breast cancer.  She is status post XRT to the spine/ribs.  Patient was hospitalized 03/09/2021 to 03/13/2021 with hypercalcemia.  Patient was readmitted 03/09/2021 with altered mental status and was again found to have hypercalcemia but unfortunately was also found to have brain metastasis on MRI.  Her hospitalization was complicated by severe agitation/altered mental status.  Ultimately, family opted to transition to comfort care.  She is referred to palliative care to help address goals and manage ongoing symptoms..  ? ?SOCIAL HISTORY:    ? reports that she quit smoking about 4 years ago. Her  smoking use included cigarettes. She started smoking about 19 years ago. She has a 10.00 pack-year smoking history. She has never used smokeless tobacco. She reports current alcohol use. She reports that she does not use drugs. ? ?Patient is married and lives at home with her husband and 79-1/2-year-old child.  Patient worked as a second [employer] prior to taking leave due to her cancer diagnosis.  ? ?ADVANCE DIRECTIVES:  ?None on file ? ?CODE STATUS: DNR/DNI ? ?PAST MEDICAL HISTORY: ?Past Medical History:  ?Diagnosis Date  ? Chronic constipation   ? Chronic narcotic use   ? Chronic nausea   ? due to taking chemo drug  ? Family history of breast cancer   ? GAD (generalized anxiety disorder)   ? History of pregnancy induced hypertension   ? HSV (herpes simplex virus) anogenital infection   ? positive titer only  ? 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 secondary to primiary breast cancer  ? Malignant neoplasm of upper-outer quadrant of right breast in female, estrogen receptor positive ([place])   ? oncologist--- dr Grayland Ormond;;  first dx 09/ 2020 s/p right lumpectomy w/ node dissection and completed chemoradiation 08/ 2021;;  recurrent 07/ 2022 to lung pleura,liver, and thoracic spine  ? SOB (shortness of breath)   ? 02-12-2021  pt denies with regular activity but sob with stairs  ? ? ?PAST SURGICAL HISTORY:  ?Past Surgical History:  ?Procedure Laterality Date  ? ABDOMINAL HYSTERECTOMY N/A 02/17/2021  ? BREAST LUMPECTOMY WITH AXILLARY LYMPH NODE BIOPSY Right 11/21/2018  ? Procedure: RIGHT BREAST LUMPECTOMY WITH SENTINEL LYMPH NODE BIOPSY;  Surgeon: Jovita Kussmaul, MD;  Location: [place];  Service: General;  Laterality: Right;  ? BREAST REDUCTION SURGERY Bilateral 11/21/2018  ? Procedure: BILATERAL MAMMARY REDUCTION  (  BREAST);  Surgeon: Wallace Going, DO;  Location: [place];  Service: Plastics;  Laterality: Bilateral;  ? CHEST TUBE INSERTION Left 07/29/2020  ? Procedure: INSERTION PLEURAL DRAINAGE CATHETER;  Surgeon:  Lajuana Matte, MD;  Location: [place];  Service: Thoracic;  Laterality: Left;  ? IRRIGATION AND DEBRIDEMENT STERNOCLAVICULAR JOINT-STERNUM AND RIBS N/A 08/13/2020  ? Procedure: IRRIGATION AND DEBRIDEMENT CHEST WALL ABSCESS;  Surgeon: Lajuana Matte, MD;  Location: [place];  Service: Cardiothoracic;  Laterality: N/A;  ? IUD REMOVAL N/A 02/17/2021  ? Procedure: INTRAUTERINE DEVICE (IUD) REMOVAL;  Surgeon: Linda Hedges, DO;  Location: [place];  Service: Gynecology;  Laterality: N/A;  ? LAPAROSCOPIC SALPINGO OOPHERECTOMY Bilateral 02/17/2021  ? Procedure: LAPAROSCOPIC BILATERAL SALPINGO OOPHORECTOMY;  Surgeon: Linda Hedges, DO;  Location: [place];  Service: Gynecology;  Laterality: Bilateral;  ? LEFT HEART CATH AND CORONARY ANGIOGRAPHY N/A 02/25/2021  ? Procedure: LEFT HEART CATH AND CORONARY ANGIOGRAPHY possible PCI and stent;  Surgeon: Yolonda Kida, MD;  Location: [place] [facility];  Service: Cardiovascular;  Laterality: N/A;  ? PLEURAL BIOPSY Left 07/29/2020  ? Procedure: PLEURAL BIOPSY;  Surgeon: Lajuana Matte, MD;  Location: [place];  Service: Thoracic;  Laterality: Left;  ? PLEURAL EFFUSION DRAINAGE Left 07/29/2020  ? Procedure: DRAINAGE OF PLEURAL EFFUSION;  Surgeon: Lajuana Matte, MD;  Location: [place];  Service: Thoracic;  Laterality: Left;  ? PORT-A-CATH REMOVAL N/A 07/06/2019  ? Procedure: REMOVAL PORT-A-CATH;  Surgeon: Jovita Kussmaul, MD;  Location: [place];  Service: General;  Laterality: N/A;  ? PORTACATH PLACEMENT N/A 11/21/2018  ? Procedure: INSERTION LEFT PORT-A-CATH WITH ULTRASOUND GUIDANCE;  Surgeon: Jovita Kussmaul, MD;  Location: [place];  Service: General;  Laterality: N/A;  ? TONSILLECTOMY  1987  ? VIDEO ASSISTED THORACOSCOPY Left 07/29/2020  ? Procedure: VIDEO ASSISTED THORACOSCOPY;  Surgeon: Lajuana Matte, MD;  Location: [place];  Service: Thoracic;  Laterality: Left;  ? WISDOM TOOTH EXTRACTION     ? ? ?HEMATOLOGY/ONCOLOGY HISTORY:  ?Oncology History  ?Malignant neoplasm of upper-outer quadrant of right breast in female, estrogen receptor positive ([place])  ?09/29/2018 Initial Diagnosis  ? Malignant neoplasm of upper-outer quadrant of right breast in female, estrogen receptor positive ([place]) ?  ?09/2018 -  Anti-estrogen oral therapy  ? Tamoxifen; held from 12/05/2018-09/06/2019 for surgery ?  ?10/05/2018 Cancer Staging  ? Staging form: Breast, AJCC 8th Edition ?- Clinical stage from 10/05/2018: Stage IB (cT2, cN0, cM0, G2, ER+, PR+, HER2-) - Signed by Chauncey Cruel, MD on 04/02/2020 ?Stage prefix: Initial diagnosis ?Histologic grading system: 3 grade system ?  ?10/13/2018 Genetic Testing  ? Negative genetic testing. No pathogenic variants identified on the [facility] Breast Cancer STAT Panel + Common Hereditary Cancers Panel. The Common Hereditary Cancers Panel offered by [facility] includes sequencing and/or deletion duplication testing of the following 47 genes: APC, ATM, AXIN2, BARD1, BMPR1A, BRCA1, BRCA2, BRIP1, CDH1, CDKN2A (p14ARF), CDKN2A (p16INK4a), CKD4, CHEK2, CTNNA1, DICER1, EPCAM (Deletion/duplication testing only), GREM1 (promoter region deletion/duplication testing only), KIT, MEN1, MLH1, MSH2, MSH3, MSH6, MUTYH, NBN, NF1, NHTL1, PALB2, PDGFRA, PMS2, POLD1, POLE, PTEN, RAD50, RAD51C, RAD51D, SDHB, SDHC, SDHD, SMAD4, SMARCA4. STK11, TP53, TSC1, TSC2, and VHL.  The following genes were evaluated for sequence changes only: SDHA and HOXB13 c.251G>A variant only. The report date is 10/13/2018.  ?  ?11/21/2018 Surgery  ? Right lumpectomy [name]) [phone]): IDC, grade 2, 2.6 cm, with DCIS. Negative margins. 1/4 lymph nodes positive for macrometastasis. ER/PR  positive, HER-2 negative. ?  ?12/26/2018 - 06/01/2019 Chemotherapy  ? dexamethasone (DECADRON) 4 MG tablet, 1 of 1 cycle, Start date: 12/05/2018, End date: 03/02/2019 ? ?DOXOrubicin (ADRIAMYCIN) chemo injection 114 mg, 60 mg/m2 = 114 mg, Intravenous,  Once,  4 of 4 cycles. Administration: 114 mg (01/05/2019), 114 mg (01/19/2019), 114 mg (02/02/2019), 114 mg (02/15/2019) ? ?palonosetron (ALOXI) injection 0.25 mg, 0.25 mg, Intravenous,  Once, 1 of 1 cycle. Administration: 0.25

## 2021-04-05 NOTE — Progress Notes (Signed)
?   2021-04-24 0800  ?Clinical Encounter Type  ?Visited With Patient and family together  ?Visit Type Follow-up;Patient actively dying  ?Spiritual Encounters  ?Spiritual Needs Grief support;Prayer  ? ?Chaplain followed up with family who are at bedside of patient who is on comfort care. Chaplain provided support through compassionate presence and prayer. ?

## 2021-04-05 NOTE — Progress Notes (Signed)
?   04/22/21 1200  ?Clinical Encounter Type  ?Visited With Patient and family together  ?Visit Type Follow-up  ?Referral From Nurse  ?Consult/Referral To Chaplain  ? ? ?Chaplain provided compassionate presence for patient and family during EOL. Patient passed. Chaplain offered prayer and support for patient and extended family members. ?

## 2021-04-05 NOTE — Death Summary Note (Signed)
? ?DEATH SUMMARY  ? ?Patient Details  ?Name: Kaylee Romero ?MRN: [id number] ?DOB: Jun 16, 1980 ?[id number], Lucianne Muss, MD ?Admission/Discharge Information  ? ?Admit Date:  29-Mar-2021  ?Date of Death: Date of Death: 04-01-2021  ?Time of Death: Time of Death: March 23, 1140  ?Length of Stay: 3  ? ?Principle Cause of death: Widely metastasized breast cancer. ? ?Hospital Diagnoses: ?Principal Problem: ?  AMS (altered mental status) ?Active Problems: ?  Hypercalcemia ?  Sepsis ([place]) ?  Brain metastasis ([place]) ?  Essential hypertension ?  GAD (generalized anxiety disorder) ?  Hypokalemia ?  Metastatic breast cancer [facility]) ? ? ?Hospital Course: ?Taken from an prior notes. ? ?[name] is a 41 y.o. female with medical history significant of widely metastasized stage IV breast cancer and  GAD was admitted last night with concern of altered mental status and lethargy for the past 3 days. ? ?She was found to have hypercalcemia and new diagnosis of multiple brain mets on MRI brain, which shows supratentorial and infratentorial brain consistent with widespread metastatic disease.  She also has diffuse dural thickening concerning for dural metastatic disease.  She also has multiple ill-defined calvarial enhancement consistent with osseous metastatic disease. ? ?Patient was given calcitonin and zoledronic acid along with aggressive hydration, calcium not trending down. ? ?She was also started on IV steroid and radiation oncology was consulted and they are planning to start full brain radiation on Monday.  Patient was also scheduled to get chest wall radiation for bone mets.  She has previously received palliative radiation to spine. ? ?Patient was scheduled to have her liver biopsy and port placement today with IR, had a discussion with her oncologist Dr. Marc Morgans on an IR and decided to proceed with PICC line at this time and biopsy and port can be postponed till discharge.  PICC line ordered. ? ?Patient is having worsening anxiety and  pain.  Giving morphine and Ativan. ?Family also requested a meeting with her oncologist and palliative care from cancer center, most likely this afternoon after Dr. Grayland Ormond finishes his clinic. ?Also started her on Precedex infusion and Dilaudid was added as we were unable to control her anxiety and pain. ? ?There was also concern of sepsis with leukocytosis, sepsis ruled out.  No obvious source of infection at this time.  Procalcitonin at 0.16 but she is having active malignancy.  Cefepime and vancomycin was discontinued and we will observe her without any antibiotics at this time. ?She has worsening leukocytosis most likely secondary to her disease burden and now with IV steroid. ? ?3/23: Patient was sedated when seen during morning rounds.  She was very agitated overnight. ?Radiation oncology deferred simulation as she has to be more calmer.  Had a long discussion back-and-forth with family, initially they want aggressive measures stating that we are already losing the battle and she wants to fight till the very end.  Discussed with PCCM to see if it is a possibility for intubation to get liver biopsy, patient was not really responding to recent chemo with very aggressive disease progression, there was a concern of evolution of her prior breast cancer. ?Patient has very extensive pulmonary metastasis with extensive involvement of spine and ribs and other bony lesions.  After discussion with PCCM and family, they are leaning more toward comfort care. ? ?Assessment and Plan: ?* AMS (altered mental status) ?Pt coming to [place] with lethargy and somnolent state attribute to combination of hypercalcemia and brain mets. ?Hypercalcemia improving but she remained very anxious.  She was also placed on Decadron and Keppra per neurosurgery, Decadron might be contributing to her anxiety along with uncontrolled pain. ?Talk with neurosurgery and they would like to continue Keppra and deferring the decision about Decadron to  radiation oncology-message was sent. ?Started her on Precedex infusion for uncontrolled anxiety as she was not responding to Ativan.  Currently sedating. ?-Continue to monitor ?-Family is not leaning towards comfort care ?-Patient is very high risk for deterioration and death. ? ?Hypercalcemia ?Hypercalcemia of malignancy.  ?Calcium appropriately trending down. ?Received calcitonin and ZA. ?Monitor on tele in stepdown as pt is high risk for arrhythmias.  ?Monitor telemetry and intervals.  ? ? ?Sepsis ([place]) ?Sepsis ruled out.  Leukocytosis most likely secondary to disease burden and now worsening with steroid.  Barely positive procalcitonin and no source of infection.  Preliminary cultures negative. ?-Stop antibiotics and observe ? ? ?Brain metastasis ([place]) ?Patient with widespread metastatic disease, history of ER positive stage IV breast cancer not responding to current management as disease continue to spread.  Now with multiple brain and skull mets.  Dr. Grayland Ormond is aware ?Plan is to start palliative whole brain radiation on Monday. ?Unable to place port and do liver biopsy as she is unstable. ?PICC line ordered-unable to do at this time due to extreme anxiety. ?Family is waiting for her oncologist visit to make further decisions. ?Very difficult situation and extremely high risk for deterioration and death. ?-Continue with Precedex infusion ?-Continue with morphine and Dilaudid. ?-Continue with Keppra and Decadron for now ?-Monitor for any respiratory compromise, patient is still full code. ?  ? ?Essential hypertension ?Blood pressure variable, currently within goal. ?-Continue with metoprolol ?-Continue to monitor ? ? ?GAD (generalized anxiety disorder) ?Patient with worsening anxiety and very restless.  Pain is also contributory.  She was also placed on Decadron which can cause increase in anxiety. ?Message sent to radiation oncology if she has to continue with steroid. ?She was started on Precedex infusion as  as needed Ativan was not working. ?-Continue with Precedex infusion for now ?-Continue venlafaxine.  ? ?Hypokalemia ?Magnesium levels at 1.6 ?Replace potassium and magnesium. ?Continue to monitor ? ? ? ?Metastatic breast cancer ([place]) ?Patient with very extensive metastatic disease. ?Unable to obtain liver biopsy as planned to see if there is any evolution in her prior breast cancer due to being unstable and very agitated. ?Unable to obtain Simulation for brain radiation due to excessive agitation. ?Family is not leaning towards comfort care ? ?Procedures: None ? ?Consultations: PCCM, oncology, IR, palliative care ? ?The results of significant diagnostics from this hospitalization (including imaging, microbiology, ancillary and laboratory) are listed below for reference.  ? ?Significant Diagnostic Studies: ?DG Chest 2 View ? ?Result Date: 03/23/2021 ?CLINICAL DATA:  41 year old female with shortness of breath and chest pain EXAM: CHEST - 2 VIEW COMPARISON:  03/09/2021, 02/24/2021 FINDINGS: Cardiomediastinal silhouette enlarged, unchanged. Obscuration of the left hemidiaphragm with opacity at the left lung base. Opacity in the costophrenic sulcus on the lateral view. Thickening of the fissure, new from the comparison. Meniscus at the right lung base, new from the comparison. Innumerable nodular densities, increased in size/conspicuity from recent comparison studies. No pneumothorax. IMPRESSION: New bilateral pleural effusions and left-sided consolidation/atelectasis. Interval progression of pulmonary metastases. Electronically Signed   By: Corrie Mckusick D.O.   On: 03/23/2021 07:12  ? ?DG Chest 2 View ? ?Result Date: 03/09/2021 ?CLINICAL DATA:  Shortness of breath EXAM: CHEST - 2 VIEW COMPARISON:  Chest radiograph dated February 24, 2021 and CT examination dated February 23, 2021 FINDINGS: The heart is enlarged. Left basilar opacity which may represent atelectasis or infiltrate, unchanged. Multiple bilateral nodular  opacities consistent with patient's known metastatic disease is unchanged. No appreciable pneumothorax. No acute osseous abnormality. IMPRESSION: 1.  Stable cardiomegaly. 2. Left basilar opacity which may represe

## 2021-04-05 DEATH — deceased

## 2021-04-07 ENCOUNTER — Ambulatory Visit: Payer: BC Managed Care – PPO

## 2021-04-08 ENCOUNTER — Ambulatory Visit: Payer: BC Managed Care – PPO

## 2021-04-09 ENCOUNTER — Ambulatory Visit: Payer: BC Managed Care – PPO

## 2021-04-10 ENCOUNTER — Telehealth: Payer: Self-pay | Admitting: *Deleted

## 2021-04-10 ENCOUNTER — Ambulatory Visit: Payer: BC Managed Care – PPO | Admitting: Pharmacist

## 2021-04-10 ENCOUNTER — Ambulatory Visit: Payer: BC Managed Care – PPO

## 2021-04-10 ENCOUNTER — Other Ambulatory Visit: Payer: BC Managed Care – PPO

## 2021-04-10 ENCOUNTER — Ambulatory Visit: Payer: BC Managed Care – PPO | Admitting: Oncology

## 2021-04-10 ENCOUNTER — Encounter: Payer: BC Managed Care – PPO | Admitting: Hospice and Palliative Medicine

## 2021-04-10 NOTE — Telephone Encounter (Signed)
Incoming call from Kaylee Romero asking if Kaylee Romero would meet face to face with him and [name]  as they just have some questions since her death they would like to discuss. Please return his call [phone] ?

## 2021-04-11 ENCOUNTER — Ambulatory Visit: Payer: BC Managed Care – PPO

## 2021-04-14 ENCOUNTER — Ambulatory Visit: Payer: BC Managed Care – PPO

## 2021-05-14 LAB — ACID FAST CULTURE WITH REFLEXED SENSITIVITIES (MYCOBACTERIA): Acid Fast Culture: NEGATIVE
